# Patient Record
Sex: Male | Born: 1960 | Race: Black or African American | Hispanic: No | Marital: Single | Smoking: Current every day smoker
Health system: Southern US, Community
[De-identification: ages and names within clinical notes are randomized; demographics above are authoritative.]

## PROBLEM LIST (undated history)

## (undated) ENCOUNTER — Emergency Department (HOSPITAL_COMMUNITY): Discharge status: Absent without leave | Payer: Self-pay

## (undated) DIAGNOSIS — D649 Anemia, unspecified: Secondary | ICD-10-CM

## (undated) DIAGNOSIS — F32A Depression, unspecified: Secondary | ICD-10-CM

## (undated) DIAGNOSIS — E119 Type 2 diabetes mellitus without complications: Secondary | ICD-10-CM

## (undated) DIAGNOSIS — I639 Cerebral infarction, unspecified: Secondary | ICD-10-CM

## (undated) DIAGNOSIS — I700 Atherosclerosis of aorta: Secondary | ICD-10-CM

## (undated) DIAGNOSIS — J4541 Moderate persistent asthma with (acute) exacerbation: Secondary | ICD-10-CM

## (undated) DIAGNOSIS — M5430 Sciatica, unspecified side: Secondary | ICD-10-CM

## (undated) DIAGNOSIS — F1011 Alcohol abuse, in remission: Secondary | ICD-10-CM

## (undated) DIAGNOSIS — D179 Benign lipomatous neoplasm, unspecified: Secondary | ICD-10-CM

## (undated) DIAGNOSIS — Z9289 Personal history of other medical treatment: Secondary | ICD-10-CM

## (undated) DIAGNOSIS — I723 Aneurysm of iliac artery: Secondary | ICD-10-CM

## (undated) DIAGNOSIS — F419 Anxiety disorder, unspecified: Secondary | ICD-10-CM

## (undated) DIAGNOSIS — F319 Bipolar disorder, unspecified: Secondary | ICD-10-CM

## (undated) DIAGNOSIS — I2510 Atherosclerotic heart disease of native coronary artery without angina pectoris: Secondary | ICD-10-CM

## (undated) DIAGNOSIS — I10 Essential (primary) hypertension: Secondary | ICD-10-CM

## (undated) DIAGNOSIS — H3093 Unspecified chorioretinal inflammation, bilateral: Secondary | ICD-10-CM

## (undated) DIAGNOSIS — K219 Gastro-esophageal reflux disease without esophagitis: Secondary | ICD-10-CM

## (undated) DIAGNOSIS — M545 Low back pain, unspecified: Secondary | ICD-10-CM

## (undated) DIAGNOSIS — G5603 Carpal tunnel syndrome, bilateral upper limbs: Secondary | ICD-10-CM

## (undated) DIAGNOSIS — F209 Schizophrenia, unspecified: Secondary | ICD-10-CM

## (undated) DIAGNOSIS — Z7961 Long term (current) use of immunomodulator: Secondary | ICD-10-CM

## (undated) DIAGNOSIS — I499 Cardiac arrhythmia, unspecified: Secondary | ICD-10-CM

## (undated) DIAGNOSIS — I480 Paroxysmal atrial fibrillation: Secondary | ICD-10-CM

## (undated) DIAGNOSIS — I5189 Other ill-defined heart diseases: Secondary | ICD-10-CM

## (undated) DIAGNOSIS — F1290 Cannabis use, unspecified, uncomplicated: Secondary | ICD-10-CM

## (undated) DIAGNOSIS — F329 Major depressive disorder, single episode, unspecified: Secondary | ICD-10-CM

## (undated) DIAGNOSIS — G8929 Other chronic pain: Secondary | ICD-10-CM

## (undated) DIAGNOSIS — Z7901 Long term (current) use of anticoagulants: Secondary | ICD-10-CM

## (undated) DIAGNOSIS — L0591 Pilonidal cyst without abscess: Secondary | ICD-10-CM

## (undated) DIAGNOSIS — G43909 Migraine, unspecified, not intractable, without status migrainosus: Secondary | ICD-10-CM

## (undated) DIAGNOSIS — I739 Peripheral vascular disease, unspecified: Secondary | ICD-10-CM

## (undated) DIAGNOSIS — K760 Fatty (change of) liver, not elsewhere classified: Secondary | ICD-10-CM

## (undated) DIAGNOSIS — M5030 Other cervical disc degeneration, unspecified cervical region: Secondary | ICD-10-CM

## (undated) HISTORY — DX: Depression, unspecified: F32.A

## (undated) HISTORY — DX: Peripheral vascular disease, unspecified: I73.9

## (undated) HISTORY — PX: CATARACT EXTRACTION: SUR2

## (undated) HISTORY — DX: Paroxysmal atrial fibrillation: I48.0

## (undated) HISTORY — PX: GANGLION CYST EXCISION: SHX1691

## (undated) HISTORY — PX: CYST EXCISION: SHX5701

## (undated) HISTORY — DX: Sciatica, unspecified side: M54.30

## (undated) HISTORY — DX: Alcohol abuse, in remission: F10.11

## (undated) HISTORY — DX: Personal history of other medical treatment: Z92.89

## (undated) HISTORY — DX: Carpal tunnel syndrome, bilateral upper limbs: G56.03

## (undated) HISTORY — DX: Major depressive disorder, single episode, unspecified: F32.9

## (undated) HISTORY — DX: Moderate persistent asthma with (acute) exacerbation: J45.41

## (undated) SURGICAL SUPPLY — 4 items
NDL HYPO 25X1 1.5 SAFETY (NEEDLE) IMPLANT
NDL SPNL 18GX3.5 QUINCKE PK (NEEDLE) ×1 IMPLANT
SOLN 0.9% NACL 1000 ML (IV SOLUTION) ×2 IMPLANT
SOLN STERILE WATER 1000 ML (IV SOLUTION) ×2 IMPLANT

---

## 1998-01-03 ENCOUNTER — Emergency Department (HOSPITAL_COMMUNITY): Admission: EM | Admit: 1998-01-03 | Discharge: 1998-01-03 | Payer: Self-pay | Admitting: Family Medicine

## 1998-02-23 ENCOUNTER — Emergency Department (HOSPITAL_COMMUNITY): Admission: EM | Admit: 1998-02-23 | Discharge: 1998-02-23 | Payer: Self-pay | Admitting: Emergency Medicine

## 1998-05-24 ENCOUNTER — Encounter: Payer: Self-pay | Admitting: Emergency Medicine

## 1998-05-24 ENCOUNTER — Emergency Department (HOSPITAL_COMMUNITY): Admission: EM | Admit: 1998-05-24 | Discharge: 1998-05-24 | Payer: Self-pay | Admitting: Emergency Medicine

## 1998-06-21 ENCOUNTER — Emergency Department (HOSPITAL_COMMUNITY): Admission: EM | Admit: 1998-06-21 | Discharge: 1998-06-21 | Payer: Self-pay | Admitting: Emergency Medicine

## 1998-06-21 ENCOUNTER — Encounter: Payer: Self-pay | Admitting: Emergency Medicine

## 1999-01-10 ENCOUNTER — Emergency Department (HOSPITAL_COMMUNITY): Admission: EM | Admit: 1999-01-10 | Discharge: 1999-01-10 | Payer: Self-pay

## 1999-01-14 ENCOUNTER — Encounter (HOSPITAL_COMMUNITY): Admission: RE | Admit: 1999-01-14 | Discharge: 1999-04-14 | Payer: Self-pay | Admitting: Orthopedic Surgery

## 1999-01-18 ENCOUNTER — Encounter: Admission: RE | Admit: 1999-01-18 | Discharge: 1999-04-18 | Payer: Self-pay | Admitting: Orthopedic Surgery

## 2002-12-19 ENCOUNTER — Emergency Department (HOSPITAL_COMMUNITY): Admission: EM | Admit: 2002-12-19 | Discharge: 2002-12-19 | Payer: Self-pay | Admitting: Emergency Medicine

## 2003-07-19 HISTORY — PX: INTERCOSTAL NERVE BLOCK: SHX5021

## 2005-03-07 ENCOUNTER — Emergency Department (HOSPITAL_COMMUNITY): Admission: EM | Admit: 2005-03-07 | Discharge: 2005-03-07 | Payer: Self-pay | Admitting: Emergency Medicine

## 2006-03-12 ENCOUNTER — Emergency Department (HOSPITAL_COMMUNITY): Admission: EM | Admit: 2006-03-12 | Discharge: 2006-03-12 | Payer: Self-pay | Admitting: Emergency Medicine

## 2007-02-12 ENCOUNTER — Emergency Department (HOSPITAL_COMMUNITY): Admission: EM | Admit: 2007-02-12 | Discharge: 2007-02-12 | Payer: Self-pay | Admitting: Emergency Medicine

## 2007-07-27 ENCOUNTER — Emergency Department (HOSPITAL_COMMUNITY): Admission: EM | Admit: 2007-07-27 | Discharge: 2007-07-27 | Payer: Self-pay | Admitting: Emergency Medicine

## 2007-08-25 ENCOUNTER — Emergency Department (HOSPITAL_COMMUNITY): Admission: EM | Admit: 2007-08-25 | Discharge: 2007-08-25 | Payer: Self-pay | Admitting: Emergency Medicine

## 2007-10-17 ENCOUNTER — Ambulatory Visit (HOSPITAL_COMMUNITY): Admission: RE | Admit: 2007-10-17 | Discharge: 2007-10-17 | Payer: Self-pay | Admitting: Chiropractic Medicine

## 2009-02-10 ENCOUNTER — Emergency Department (HOSPITAL_COMMUNITY): Admission: EM | Admit: 2009-02-10 | Discharge: 2009-02-10 | Payer: Self-pay | Admitting: Emergency Medicine

## 2010-02-24 ENCOUNTER — Emergency Department (HOSPITAL_COMMUNITY): Admission: EM | Admit: 2010-02-24 | Discharge: 2010-02-24 | Payer: Self-pay | Admitting: Emergency Medicine

## 2010-03-01 ENCOUNTER — Emergency Department (HOSPITAL_COMMUNITY): Admission: EM | Admit: 2010-03-01 | Discharge: 2010-03-01 | Payer: Self-pay | Admitting: Emergency Medicine

## 2010-10-01 LAB — DIFFERENTIAL
Basophils Absolute: 0 10*3/uL (ref 0.0–0.1)
Basophils Absolute: 0 10*3/uL (ref 0.0–0.1)
Basophils Relative: 0 % (ref 0–1)
Basophils Relative: 0 % (ref 0–1)
Eosinophils Absolute: 0.1 10*3/uL (ref 0.0–0.7)
Eosinophils Absolute: 0.1 10*3/uL (ref 0.0–0.7)
Eosinophils Relative: 1 % (ref 0–5)
Eosinophils Relative: 2 % (ref 0–5)
Lymphocytes Relative: 18 % (ref 12–46)
Lymphocytes Relative: 30 % (ref 12–46)
Lymphs Abs: 1 10*3/uL (ref 0.7–4.0)
Lymphs Abs: 1.5 10*3/uL (ref 0.7–4.0)
Monocytes Absolute: 0.4 10*3/uL (ref 0.1–1.0)
Monocytes Absolute: 0.4 10*3/uL (ref 0.1–1.0)
Monocytes Relative: 7 % (ref 3–12)
Monocytes Relative: 8 % (ref 3–12)
Neutro Abs: 3 10*3/uL (ref 1.7–7.7)
Neutro Abs: 4.1 10*3/uL (ref 1.7–7.7)
Neutrophils Relative %: 60 % (ref 43–77)
Neutrophils Relative %: 74 % (ref 43–77)

## 2010-10-01 LAB — HEPATIC FUNCTION PANEL
ALT: 25 U/L (ref 0–53)
AST: 19 U/L (ref 0–37)
Albumin: 3.9 g/dL (ref 3.5–5.2)
Alkaline Phosphatase: 54 U/L (ref 39–117)
Bilirubin, Direct: 0.2 mg/dL (ref 0.0–0.3)
Indirect Bilirubin: 0.7 mg/dL (ref 0.3–0.9)
Total Bilirubin: 0.9 mg/dL (ref 0.3–1.2)
Total Protein: 8 g/dL (ref 6.0–8.3)

## 2010-10-01 LAB — URINALYSIS, ROUTINE W REFLEX MICROSCOPIC
Bilirubin Urine: NEGATIVE
Glucose, UA: NEGATIVE mg/dL
Glucose, UA: NEGATIVE mg/dL
Ketones, ur: NEGATIVE mg/dL
Leukocytes, UA: NEGATIVE
Leukocytes, UA: NEGATIVE
Nitrite: NEGATIVE
Nitrite: NEGATIVE
Protein, ur: NEGATIVE mg/dL
Protein, ur: NEGATIVE mg/dL
Specific Gravity, Urine: 1.019 (ref 1.005–1.030)
Specific Gravity, Urine: 1.026 (ref 1.005–1.030)
Urobilinogen, UA: 1 mg/dL (ref 0.0–1.0)
Urobilinogen, UA: 1 mg/dL (ref 0.0–1.0)
pH: 6 (ref 5.0–8.0)
pH: 6 (ref 5.0–8.0)

## 2010-10-01 LAB — URINE MICROSCOPIC-ADD ON

## 2010-10-01 LAB — COMPREHENSIVE METABOLIC PANEL
ALT: 47 U/L (ref 0–53)
AST: 36 U/L (ref 0–37)
Albumin: 3.7 g/dL (ref 3.5–5.2)
Alkaline Phosphatase: 54 U/L (ref 39–117)
BUN: 10 mg/dL (ref 6–23)
CO2: 26 mEq/L (ref 19–32)
Calcium: 9.3 mg/dL (ref 8.4–10.5)
Chloride: 105 mEq/L (ref 96–112)
Creatinine, Ser: 1.22 mg/dL (ref 0.4–1.5)
GFR calc non Af Amer: >60 mL/min (ref >60)
Glucose, Bld: 94 mg/dL (ref 70–99)
Potassium: 3.6 mEq/L (ref 3.5–5.1)
Sodium: 137 mEq/L (ref 135–145)
Total Bilirubin: 0.9 mg/dL (ref 0.3–1.2)
Total Protein: 7.4 g/dL (ref 6.0–8.3)

## 2010-10-01 LAB — BASIC METABOLIC PANEL
BUN: 9 mg/dL (ref 6–23)
CO2: 27 mEq/L (ref 19–32)
Calcium: 9.5 mg/dL (ref 8.4–10.5)
Chloride: 103 mEq/L (ref 96–112)
Creatinine, Ser: 1.27 mg/dL (ref 0.4–1.5)
GFR calc non Af Amer: >60 mL/min (ref >60)
Glucose, Bld: 119 mg/dL — ABNORMAL HIGH (ref 70–99)
Potassium: 3.6 mEq/L (ref 3.5–5.1)
Sodium: 137 mEq/L (ref 135–145)

## 2010-10-01 LAB — CBC
HCT: 41 % (ref 39.0–52.0)
HCT: 42 % (ref 39.0–52.0)
Hemoglobin: 13.4 g/dL (ref 13.0–17.0)
Hemoglobin: 13.6 g/dL (ref 13.0–17.0)
MCH: 25.4 pg — ABNORMAL LOW (ref 26.0–34.0)
MCH: 25.4 pg — ABNORMAL LOW (ref 26.0–34.0)
MCHC: 32.5 g/dL (ref 30.0–36.0)
MCHC: 32.6 g/dL (ref 30.0–36.0)
MCV: 78 fL (ref 78.0–100.0)
MCV: 78.3 fL (ref 78.0–100.0)
Platelets: 237 10*3/uL (ref 150–400)
Platelets: 243 10*3/uL (ref 150–400)
RBC: 5.26 MIL/uL (ref 4.22–5.81)
RBC: 5.36 MIL/uL (ref 4.22–5.81)
RDW: 15 % (ref 11.5–15.5)
RDW: 15.1 % (ref 11.5–15.5)
WBC: 5.1 10*3/uL (ref 4.0–10.5)
WBC: 5.6 10*3/uL (ref 4.0–10.5)

## 2010-10-01 LAB — LIPASE, BLOOD
Lipase: 24 U/L (ref 11–59)
Lipase: 28 U/L (ref 11–59)

## 2010-10-01 LAB — HEMOCCULT GUIAC POC 1CARD (OFFICE): Fecal Occult Bld: NEGATIVE

## 2011-05-02 LAB — CBC
HCT: 40.6
Hemoglobin: 13.3
MCHC: 32.8
MCV: 76.2 — ABNORMAL LOW
Platelets: 225
RBC: 5.32
RDW: 16.2 — ABNORMAL HIGH
WBC: 8.6

## 2011-05-02 LAB — BASIC METABOLIC PANEL
BUN: 8
CO2: 26
Calcium: 8.7
Chloride: 104
Creatinine, Ser: 1.16
GFR calc non Af Amer: >60
Glucose, Bld: 105 — ABNORMAL HIGH
Potassium: 3.6
Sodium: 136

## 2011-05-02 LAB — DIFFERENTIAL
Basophils Absolute: 0
Basophils Relative: 0
Eosinophils Absolute: 0.2
Eosinophils Relative: 2
Lymphocytes Relative: 18
Lymphs Abs: 1.5
Monocytes Absolute: 0.5
Monocytes Relative: 6
Neutro Abs: 6.4
Neutrophils Relative %: 74

## 2011-05-02 LAB — WOUND CULTURE: Culture: NO GROWTH

## 2011-06-01 ENCOUNTER — Emergency Department (HOSPITAL_COMMUNITY)
Admission: EM | Admit: 2011-06-01 | Discharge: 2011-06-02 | Disposition: A | Discharge status: Home / Self Care | Payer: No Typology Code available for payment source | Attending: Emergency Medicine | Admitting: Emergency Medicine

## 2011-06-01 ENCOUNTER — Encounter: Payer: Self-pay | Admitting: *Deleted

## 2011-06-01 DIAGNOSIS — M5126 Other intervertebral disc displacement, lumbar region: Secondary | ICD-10-CM | POA: Insufficient documentation

## 2011-06-01 DIAGNOSIS — R209 Unspecified disturbances of skin sensation: Secondary | ICD-10-CM | POA: Insufficient documentation

## 2011-06-01 DIAGNOSIS — R296 Repeated falls: Secondary | ICD-10-CM | POA: Insufficient documentation

## 2011-06-01 DIAGNOSIS — M545 Low back pain, unspecified: Secondary | ICD-10-CM | POA: Insufficient documentation

## 2011-06-01 DIAGNOSIS — M25569 Pain in unspecified knee: Secondary | ICD-10-CM | POA: Insufficient documentation

## 2011-06-01 DIAGNOSIS — M533 Sacrococcygeal disorders, not elsewhere classified: Secondary | ICD-10-CM | POA: Insufficient documentation

## 2011-06-01 DIAGNOSIS — F17200 Nicotine dependence, unspecified, uncomplicated: Secondary | ICD-10-CM | POA: Insufficient documentation

## 2011-06-01 DIAGNOSIS — G8929 Other chronic pain: Secondary | ICD-10-CM

## 2011-06-01 DIAGNOSIS — M546 Pain in thoracic spine: Secondary | ICD-10-CM | POA: Insufficient documentation

## 2011-06-01 DIAGNOSIS — W19XXXA Unspecified fall, initial encounter: Secondary | ICD-10-CM

## 2011-06-01 NOTE — ED Notes (Signed)
Pt states he was walking and right leg went into a hole. Pt c/o right ankle and knee pain. C/o of lower back pain. Pt has hx of chronic back pain.

## 2011-06-01 NOTE — ED Notes (Signed)
WRU:[id number]<UJ> Expected date:06/01/11<BR> Expected time:11:06 PM<BR> Means of arrival:Ambulance<BR> Comments:<BR> Fall, lsb, ankle injury

## 2011-06-02 ENCOUNTER — Emergency Department (HOSPITAL_COMMUNITY): Payer: No Typology Code available for payment source

## 2011-06-02 ENCOUNTER — Encounter (HOSPITAL_COMMUNITY): Payer: Self-pay

## 2011-06-02 MED ORDER — METHOCARBAMOL 500 MG PO TABS: 1000.0000 mg | ORAL_TABLET | Freq: Four times a day (QID) | ORAL | Status: AC | PRN

## 2011-06-02 MED ORDER — HYDROMORPHONE HCL PF 1 MG/ML IJ SOLN
0.5000 mg | Freq: Once | INTRAMUSCULAR | Status: AC
  Administered 2011-06-02: 0.5 mg via INTRAVENOUS
  Filled 2011-06-02: qty 1

## 2011-06-02 MED ORDER — OXYCODONE-ACETAMINOPHEN 5-325 MG PO TABS
2.0000 | ORAL_TABLET | Freq: Once | ORAL | Status: AC
  Administered 2011-06-02: 2 via ORAL
  Filled 2011-06-02: qty 2

## 2011-06-02 MED ORDER — OXYCODONE-ACETAMINOPHEN 5-325 MG PO TABS: ORAL_TABLET | ORAL | Status: AC

## 2011-06-02 MED ORDER — DIAZEPAM 5 MG PO TABS
5.0000 mg | ORAL_TABLET | Freq: Once | ORAL | Status: AC
  Administered 2011-06-02: 5 mg via ORAL
  Filled 2011-06-02: qty 1

## 2011-06-02 NOTE — ED Notes (Signed)
Pt back from MRI, alert and oriented, requesting food and pain meds. Pt made aware he is npo for now and will notify md of pain.

## 2011-06-02 NOTE — ED Provider Notes (Signed)
History     CSN: [id number] Arrival date & time: 06/01/2011 11:43 PM   First MD Initiated Contact with Patient 06/02/11 0122      Chief Complaint  Patient presents with  . Fall    (Consider location/radiation/quality/duration/timing/severity/associated sxs/prior treatment) HPI 50 year old Jason Stokes presents emergency department with complaint of fall into a hole with right leg pain and back pain. 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 Patient reports he was walking to the store when he stepped into a deep hole. Patient reports numbness to his right leg and pain in his right knee. He denies any head neck or upper body injury. Patient arrives via EMS with c-collar and backboard in place  Past Medical History  Diagnosis Date  . Chronic back pain     No past surgical history on file.  No family history on file.  History  Substance Use Topics  . Smoking status: Current Everyday Smoker -- 0.5 packs/day    Types: Cigarettes  . Smokeless tobacco: Not on file  . Alcohol Use: Yes     on occasion      Review of Systems  All other systems reviewed and are negative.    Allergies  Review of patient's allergies indicates no known allergies.  Home Medications   Current Outpatient Rx  Name Route Sig Dispense Refill  . IBUPROFEN 200 MG PO TABS Oral Take 200 mg by mouth every 6 (six) hours as needed. pain       BP 131/82  Pulse 94  Temp(Src) 97.9 F (36.6 C) (Oral)  Resp 20  SpO2 100%  Physical Exam  Nursing note and vitals reviewed. Constitutional: He is oriented to person, place, and time. He appears well-developed and well-nourished.       Mobilized on spine board and with c-collar in place. The patient is irritable  HENT:  Head: Normocephalic and atraumatic.  Nose: Nose normal.  Mouth/Throat: Oropharynx is clear and moist.  Eyes: Conjunctivae and EOM are normal. Pupils are equal, round, and reactive to light.  Neck: Normal range of motion.  Neck supple. No JVD present. No tracheal deviation present. No thyromegaly present.  Cardiovascular: Normal rate, regular rhythm, normal heart sounds and intact distal pulses.  Exam reveals no gallop and no friction rub.   No murmur heard. Pulmonary/Chest: Effort normal and breath sounds normal. No stridor. No respiratory distress. He has no wheezes. He has no rales. He exhibits no tenderness.  Abdominal: Soft. Bowel sounds are normal. He exhibits no distension and no mass. There is no tenderness. There is no rebound and no guarding.  Musculoskeletal: He exhibits no edema and no tenderness.       Pain with palpation along spine from mid thoracic to sacrum no step-offs or crepitus noted  Lymphadenopathy:    He has no cervical adenopathy.  Neurological: He is oriented to person, place, and time. No cranial nerve deficit. He exhibits normal muscle tone. Coordination normal.       Patient reports subjective loss of sensation from right mid thigh to right toes. Patient is able to flex and extend the great toe only on the right side. He is unable to lift the leg or move the ankle or the knee. No effusion noted to right knee no outward deformity or injury noted to the right leg  Skin: Skin is dry. No rash noted. No erythema. No pallor.  Psychiatric: He has a normal mood and affect. His behavior is normal. Judgment and  thought content normal.    ED Course  Procedures (including critical care time)  Labs Reviewed - No data to display Dg Thoracic Spine 2 View  06/02/2011  *RADIOLOGY REPORT*  Clinical Data: 50 year old male with back pain, stepped in hole.  THORACIC SPINE - 2 VIEW  Comparison: Lumbar series from the same day.  Findings: Normal thoracic segmentation.  Normal thoracic vertebral height and alignment.  Relatively preserved disc spaces. Visualized posterior ribs appear intact. Cervicothoracic junction alignment is within normal limits.  IMPRESSION: No acute fracture or listhesis identified in  the thoracic spine.  Original Report Authenticated By: Harley Hallmark, M.D.   Dg Lumbar Spine Complete  06/02/2011  *RADIOLOGY REPORT*  Clinical Data: 50 year old male with pain, stepped in hole, numbness radiating down the right lower extremity.  LUMBAR SPINE - COMPLETE 4+ VIEW  Comparison: 07/27/2007.  Findings: Normal lumbar segmentation.  Stable, normal vertebral height and alignment.  Relatively preserved disc spaces.  No pars fracture.  Sacrum and SI joints within normal limits.  IMPRESSION: Stable, negative radiographic appearance of the lumbar spine.  Original Report Authenticated By: Harley Hallmark, M.D.   Dg Knee Complete 4 Views Right  06/02/2011  *RADIOLOGY REPORT*  Clinical Data: Stepped in hole; right patellar knee pain.  RIGHT KNEE - COMPLETE 4+ VIEW  Comparison: None.  Findings: There is no evidence of fracture or dislocation.  The joint spaces are preserved.  No significant degenerative change is seen; the patellofemoral joint is grossly unremarkable in appearance.  A small joint effusion is seen.  The visualized soft tissues are otherwise unremarkable in appearance.  IMPRESSION:  1.  No evidence of fracture or dislocation. 2.  Small joint effusion noted.  Original Report Authenticated By: Tonia Ghent, M.D.        MDM  50 year old male with fall into a hole for subsequent aggravation of his chronic low back pain with numbness and decreased strength in right leg. X-rays without acute fracture or dislocation. Patient has been medicated with Dilaudid and Valium with out improvement in symptoms. He is awaiting an MRI of his lumbar spine. Care passed to Dr. Clarene Duke awaiting MRI results        Olivia Mackie, MD 06/02/11 [phone]

## 2011-06-02 NOTE — ED Notes (Signed)
Pt. Stated that he would like to speak with RN about pain medications.  Will notify RN.

## 2011-06-02 NOTE — ED Provider Notes (Addendum)
50yo M, c/o right lower back pain rad into RLE fall into a hole last night.  Pt has hx of same/chronic pain, endorses he has "bad disks" at "L4-L5" per his last MRI.  Echart review reveals pt has been seen multiple times in the ED since 2007 for c/o right sided LBP radiating into his RLE with decreased sensation RLE, and walking with limp.  Has seen a Production manager in [place], [place].  MRI LS today without acute surgical process:    Mr Lumbar Spine Wo Contrast 06/02/2011  *RADIOLOGY REPORT*  Clinical Data: Back pain with right leg pain  MRI LUMBAR SPINE WITHOUT CONTRAST  Technique:  Multiplanar and multiecho pulse sequences of the lumbar spine were obtained without intravenous contrast.  Comparison: Radiographs 06/02/2011  Findings: Normal lumbar alignment.  Negative for fracture or mass. No bone marrow edema.  Conus medullaris is normal and terminates at L1.  L1-2:  Negative  L2-3:  Negative  L3-4:  Mild disc bulging and early vertebral spurring.  No disc protrusion or significant stenosis.  L4-5:  Mild disc degeneration.  Mild disc bulging and early endplate spurring without disc protrusion or significant spinal stenosis.  L5-S1:  Negative  IMPRESSION: Mild disc degeneration L3-4 and L4-5.  No focal disc protrusion or neural impingement.  Original Report Authenticated By: Camelia Phenes, M.D.   Upon my entering the room, pt sitting up leaning over to his left side on his left hip with his entire right LE flexed/right foot on stretcher supporting him while he brushed his teeth.  Dx testing d/w pt.  Questions answered.  Verb understanding.  Pt then easily and without distress or apparent discomfort righted himself to sit upright with his hands behind his head, fully straightening is RLE.  Pt previously had been refusing to move his RLE for exam.  NAD, moves all ext well, resps easy , A&O.  Pt is agreeable to d/c home with outpt f/u but states "I want some breakfast and some pain medicine before I leave."    8:33 AM:  Called back into room by ED RN, pt apparently refusing to leave "unless I get some crutches or I'll just be right back here because I just know I'm going to fall again because of my back pain."  Pt encouraged to f/u with his Neurosurgeon in [place] for his chronic pain needs.  Verb understanding.            Refujio Haymer Allison Quarry, DO 06/02/11 1610  Laray Anger, DO 06/02/11 [phone]

## 2011-06-02 NOTE — ED Notes (Signed)
Pt off back board

## 2011-06-02 NOTE — ED Notes (Signed)
Patient transported to MRI 

## 2011-06-02 NOTE — ED Notes (Signed)
Pt provided discharge instructions and teaching pt provided crutches and teaching. Assisted to discharge window.

## 2011-07-18 ENCOUNTER — Encounter (HOSPITAL_COMMUNITY): Payer: Self-pay | Admitting: Emergency Medicine

## 2011-07-18 ENCOUNTER — Emergency Department (HOSPITAL_COMMUNITY)
Admission: EM | Admit: 2011-07-18 | Discharge: 2011-07-18 | Disposition: A | Discharge status: Home / Self Care | Payer: No Typology Code available for payment source | Attending: Emergency Medicine | Admitting: Emergency Medicine

## 2011-07-18 DIAGNOSIS — M549 Dorsalgia, unspecified: Secondary | ICD-10-CM

## 2011-07-18 DIAGNOSIS — M5380 Other specified dorsopathies, site unspecified: Secondary | ICD-10-CM | POA: Insufficient documentation

## 2011-07-18 DIAGNOSIS — M542 Cervicalgia: Secondary | ICD-10-CM | POA: Insufficient documentation

## 2011-07-18 DIAGNOSIS — M545 Low back pain, unspecified: Secondary | ICD-10-CM | POA: Insufficient documentation

## 2011-07-18 DIAGNOSIS — R209 Unspecified disturbances of skin sensation: Secondary | ICD-10-CM | POA: Insufficient documentation

## 2011-07-18 DIAGNOSIS — M79609 Pain in unspecified limb: Secondary | ICD-10-CM | POA: Insufficient documentation

## 2011-07-18 DIAGNOSIS — G8929 Other chronic pain: Secondary | ICD-10-CM

## 2011-07-18 DIAGNOSIS — F17200 Nicotine dependence, unspecified, uncomplicated: Secondary | ICD-10-CM | POA: Insufficient documentation

## 2011-07-18 DIAGNOSIS — R296 Repeated falls: Secondary | ICD-10-CM | POA: Insufficient documentation

## 2011-07-18 DIAGNOSIS — R1110 Vomiting, unspecified: Secondary | ICD-10-CM | POA: Insufficient documentation

## 2011-07-18 MED ORDER — DIAZEPAM 10 MG PO TABS: 5.0000 mg | ORAL_TABLET | Freq: Three times a day (TID) | ORAL | Status: AC | PRN

## 2011-07-18 MED ORDER — KETOROLAC TROMETHAMINE 60 MG/2ML IM SOLN
60.0000 mg | Freq: Once | INTRAMUSCULAR | Status: AC
  Administered 2011-07-18: 60 mg via INTRAMUSCULAR
  Filled 2011-07-18: qty 2

## 2011-07-18 MED ORDER — DIAZEPAM 5 MG PO TABS
10.0000 mg | ORAL_TABLET | Freq: Once | ORAL | Status: AC
  Administered 2011-07-18: 10 mg via ORAL
  Filled 2011-07-18: qty 2

## 2011-07-18 MED ORDER — OXYCODONE-ACETAMINOPHEN 5-325 MG PO TABS: 1.0000 | ORAL_TABLET | Freq: Four times a day (QID) | ORAL | Status: AC | PRN

## 2011-07-18 NOTE — ED Notes (Signed)
Back and neck spasms. Larey Seat in a hole where he lives on Nov. the 14th. Pain so bad now he can not take it any more.Marland Kitchen

## 2011-07-18 NOTE — ED Provider Notes (Signed)
History     CSN: [id number]  Arrival date & time 07/18/11  1612   First MD Initiated Contact with Patient 07/18/11 1838      Chief Complaint  Patient presents with  . Back Pain    low back and neck spasms x since the 14 of Nov. Has injuried at that time.     (Consider location/radiation/quality/duration/timing/severity/associated sxs/prior treatment) Patient is a 50 y.o. male presenting with back pain. The history is provided by the patient.  Back Pain  This is a chronic problem. The current episode started more than 1 week ago. The problem occurs constantly. The problem has not changed since onset.Associated with: Chronic back pain with worsening after he fell into a hole on November 14. The pain is present in the lumbar spine. The quality of the pain is described as stabbing. Radiates to: Entire right lower extremity. The pain is severe. Exacerbated by: Straightening his leg. The pain is worse during the night. Associated symptoms include numbness, leg pain and tingling. Pertinent negatives include no chest pain, no fever, no weight loss, no headaches, no abdominal pain, no abdominal swelling, no bowel incontinence, no perianal numbness, no bladder incontinence, no dysuria, no pelvic pain and no weakness. Treatments tried: Narcotics, the patient reports he has run out of his prior prescription. The treatment provided significant relief.    Past Medical History  Diagnosis Date  . Chronic back pain     History reviewed. No pertinent past surgical history.  No family history on file.  History  Substance Use Topics  . Smoking status: Current Some Day Smoker -- 0.5 packs/day    Types: Cigarettes  . Smokeless tobacco: Never Used  . Alcohol Use: Yes     on occasion      Review of Systems  Constitutional: Negative for fever, chills and weight loss.  HENT: Positive for neck pain. Negative for sore throat and neck stiffness.   Eyes: Negative for pain and visual disturbance.    Respiratory: Negative for cough, chest tightness and shortness of breath.   Cardiovascular: Negative for chest pain.  Gastrointestinal: Negative for nausea, abdominal pain, constipation and bowel incontinence.       Vomiting yesterday but none today  Genitourinary: Negative for bladder incontinence, dysuria, hematuria and pelvic pain.  Musculoskeletal: Positive for back pain.       He reports difficulty ambulating  Skin: Negative for rash and wound.  Neurological: Positive for tingling and numbness. Negative for syncope, weakness and headaches.  Psychiatric/Behavioral: Negative for confusion.    Allergies  Review of patient's allergies indicates no known allergies.  Home Medications   Current Outpatient Rx  Name Route Sig Dispense Refill  . IBUPROFEN 200 MG PO TABS Oral Take 200 mg by mouth every 6 (six) hours as needed. pain       BP 162/105  Pulse 83  Temp(Src) 98.5 F (36.9 C) (Oral)  Resp 22  Physical Exam  Nursing note and vitals reviewed. Constitutional: He is oriented to person, place, and time. He appears well-developed and well-nourished. No distress.  HENT:  Head: Normocephalic and atraumatic.  Right Ear: External ear normal.  Left Ear: External ear normal.  Nose: Nose normal.  Mouth/Throat: Oropharynx is clear and moist.  Eyes: Conjunctivae are normal. Pupils are equal, round, and reactive to light.  Neck: Normal range of motion. Neck supple.       Tenderness to palpation of the right paracervical region with no palpated spasm  Cardiovascular: Normal rate, regular  rhythm and intact distal pulses.   Pulmonary/Chest: Effort normal. No respiratory distress. He exhibits no tenderness.  Abdominal: Soft. Bowel sounds are normal. He exhibits no distension. There is no tenderness.  Musculoskeletal: Normal range of motion. He exhibits no edema.       No midline spinal tenderness to palpation. There is tenderness to palpation of the right paralumbar region with  associated spasm.  Lymphadenopathy:    He has no cervical adenopathy.  Neurological: He is alert and oriented to person, place, and time. He has normal reflexes. No cranial nerve deficit. Coordination normal.       Patient with a limping gait at time of examination. Of note, he was witnessed by myself ambulating without any difficulty to speak with his fiance, a distance of approximately 20 feet was observed. He reports decreased sensation to light touch over the entire right lower extremity with no specific distribution he does have intact sensation to painful stimuli.  Skin: Skin is warm and dry. No rash noted. No erythema.    ED Course  Procedures (including critical care time)  Labs Reviewed - No data to display No results found.   Dx 1: Back pain, chronic   MDM  Chronic back pain with worsening since November 14 after he fell in a hole. He was evaluated in the emergency department at that time and an MRI of the lumbar spine was performed with no acute findings. I reviewed his prior visit note and it appears that although he initially reported that he could not move the right lower extremity, he was able to move without difficulty upon reassessment after an MRI was performed. On my exam, he is reluctant to ambulate and has a limping gait but observation of his gait when going to speak with the family member demonstrates no difficulty. No fever, cancer history, HIV, IV drug use. No fever, saddle anesthesia. He does have spasm of the right paralumbar region on exam. I treated him with Toradol and Valium and we'll plan to send him home after treatment. Stress with patient that he should followup with his neurosurgeon in [facility]  I have reviewed his records in the [place] controlled substance registry and there is only one controlled substance prescription in the last 6 months. I will write him a prescription for pain medication.       Elwyn Reach [address],  [place] 07/18/11 2025

## 2011-07-19 NOTE — ED Provider Notes (Signed)
Medical screening examination/treatment/procedure(s) were performed by non-physician practitioner and as supervising physician I was immediately available for consultation/collaboration.   Shaakira Borrero A. Patrica Duel, MD 07/19/11 1459

## 2011-09-07 ENCOUNTER — Emergency Department (HOSPITAL_COMMUNITY)
Admission: EM | Admit: 2011-09-07 | Discharge: 2011-09-07 | Disposition: A | Discharge status: Home / Self Care | Payer: No Typology Code available for payment source | Attending: Emergency Medicine | Admitting: Emergency Medicine

## 2011-09-07 ENCOUNTER — Encounter (HOSPITAL_COMMUNITY): Payer: Self-pay | Admitting: *Deleted

## 2011-09-07 DIAGNOSIS — G8929 Other chronic pain: Secondary | ICD-10-CM

## 2011-09-07 DIAGNOSIS — W19XXXA Unspecified fall, initial encounter: Secondary | ICD-10-CM | POA: Insufficient documentation

## 2011-09-07 DIAGNOSIS — I10 Essential (primary) hypertension: Secondary | ICD-10-CM

## 2011-09-07 DIAGNOSIS — M549 Dorsalgia, unspecified: Secondary | ICD-10-CM | POA: Insufficient documentation

## 2011-09-07 MED ORDER — OXYCODONE-ACETAMINOPHEN 5-325 MG PO TABS: 1.0000 | ORAL_TABLET | ORAL | Status: AC | PRN

## 2011-09-07 MED ORDER — DIAZEPAM 5 MG PO TABS: 5.0000 mg | ORAL_TABLET | Freq: Two times a day (BID) | ORAL | Status: AC

## 2011-09-07 MED ORDER — HYDROMORPHONE HCL PF 2 MG/ML IJ SOLN
2.0000 mg | Freq: Once | INTRAMUSCULAR | Status: AC
  Administered 2011-09-07: 2 mg via INTRAMUSCULAR
  Filled 2011-09-07: qty 1

## 2011-09-07 NOTE — ED Notes (Signed)
Pt came out of his room, states "im ready to go."  Pt is ambulatory with a limp and with steady gait.

## 2011-09-07 NOTE — ED Provider Notes (Signed)
seen by mepatient's back pain and balance issues are chronic and unchanged since November of 2012 . Reports multiple falls  No new injury  Has not follow up with neurosurgery as suggested  Patient has crutches at home   Doug Sou, MD 09/07/11 1636

## 2011-09-07 NOTE — ED Notes (Signed)
MD at bedside. (Dr. Jacubowitz) 

## 2011-09-07 NOTE — ED Provider Notes (Signed)
Medical screening examination/treatment/procedure(s) were conducted as a shared visit with non-physician practitioner(s) and myself.  I personally evaluated the patient during the encounter  Doug Sou, MD 09/07/11 [phone]

## 2011-09-07 NOTE — ED Notes (Signed)
Pt reports fall last night, states his legs gave out on him.  Pt has hx of chronic back pain.  Has had an MRI done in November and was seen here in December and was given a referral to a neurologist but did not follow up.  Pt also ran out of his pain meds last night.  Pt denies LOC.

## 2011-09-07 NOTE — ED Notes (Signed)
JXB:[id number]<GN> Expected date:09/07/11<BR> Expected time:12:23 PM<BR> Means of arrival:Ambulance<BR> Comments:<BR> Back pain

## 2011-09-07 NOTE — ED Notes (Signed)
Per EMS, pt from home, reports fall last night.  Reports back pain.  Pt has hx of chronic back pain, and ran out of oxycontin last night.

## 2011-09-07 NOTE — Discharge Instructions (Signed)
Continue pain management. Stretch your back gently daily. Follow up with a specialist or primary care doctor for further management.  You blood pressure is hight today too. You need to see your doctor for further management of your blood pressure  Back Pain, Adult Low back pain is very common. About 1 in 5 people have back pain.The cause of low back pain is rarely dangerous. The pain often gets better over time.About half of people with a sudden onset of back pain feel better in just 2 weeks. About 8 in 10 people feel better by 6 weeks.  CAUSES Some common causes of back pain include:  Strain of the muscles or ligaments supporting the spine.   Wear and tear (degeneration) of the spinal discs.   Arthritis.   Direct injury to the back.  DIAGNOSIS Most of the time, the direct cause of low back pain is not known.However, back pain can be treated effectively even when the exact cause of the pain is unknown.Answering your caregiver's questions about your overall health and symptoms is one of the most accurate ways to make sure the cause of your pain is not dangerous. If your caregiver needs more information, he or she may order lab work or imaging tests (X-rays or MRIs).However, even if imaging tests show changes in your back, this usually does not require surgery. HOME CARE INSTRUCTIONS For many people, back pain returns.Since low back pain is rarely dangerous, it is often a condition that people can learn to [facility] their own.   Remain active. It is stressful on the back to sit or stand in one place. Do not sit, drive, or stand in one place for more than 30 minutes at a time. Take short walks on level surfaces as soon as pain allows.Try to increase the length of time you walk each day.   Do not stay in bed.Resting more than 1 or 2 days can delay your recovery.   Do not avoid exercise or work.Your body is made to move.It is not dangerous to be active, even though your back may  hurt.Your back will likely heal faster if you return to being active before your pain is gone.   Pay attention to your body when you bend and lift. Many people have less discomfortwhen lifting if they bend their knees, keep the load close to their bodies,and avoid twisting. Often, the most comfortable positions are those that put less stress on your recovering back.   Find a comfortable position to sleep. Use a firm mattress and lie on your side with your knees slightly bent. If you lie on your back, put a pillow under your knees.   Only take over-the-counter or prescription medicines as directed by your caregiver. Over-the-counter medicines to reduce pain and inflammation are often the most helpful.Your caregiver may prescribe muscle relaxant drugs.These medicines help dull your pain so you can more quickly return to your normal activities and healthy exercise.   Put ice on the injured area.   Put ice in a plastic bag.   Place a towel between your skin and the bag.   Leave the ice on for 15 to 20 minutes, 3 to 4 times a day for the first 2 to 3 days. After that, ice and heat may be alternated to reduce pain and spasms.   Ask your caregiver about trying back exercises and gentle massage. This may be of some benefit.   Avoid feeling anxious or stressed.Stress increases muscle tension and can worsen back  pain.It is important to recognize when you are anxious or stressed and learn ways to manage it.Exercise is a great option.  SEEK MEDICAL CARE IF:  You have pain that is not relieved with rest or medicine.   You have pain that does not improve in 1 week.   You have new symptoms.   You are generally not feeling well.  SEEK IMMEDIATE MEDICAL CARE IF:   You have pain that radiates from your back into your legs.   You develop new bowel or bladder control problems.   You have unusual weakness or numbness in your arms or legs.   You develop nausea or vomiting.   You develop  abdominal pain.   You feel faint.  Document Released: 07/04/2005 Document Revised: 03/16/2011 Document Reviewed: 11/22/2010 [facility] Patient Information 2012 [place], [place].

## 2011-09-07 NOTE — ED Provider Notes (Signed)
History     CSN: [id number]  Arrival date & time 09/07/11  1244   First MD Initiated Contact with Patient 09/07/11 1331      Chief Complaint  Patient presents with  . Fall  . Back Pain    (Consider location/radiation/quality/duration/timing/severity/associated sxs/prior treatment) Patient is a 51 y.o. male presenting with back pain. The history is provided by the patient.  Back Pain  This is a chronic problem. The current episode started more than 1 week ago. The problem occurs constantly. The problem has been gradually worsening. The pain is associated with falling. The pain is present in the lumbar spine. The quality of the pain is described as shooting. The pain radiates to the right thigh, right knee and right foot. The pain is at a severity of 9/10. The pain is moderate. The symptoms are aggravated by bending and twisting. Associated symptoms include numbness, paresthesias, tingling and weakness. Pertinent negatives include no fever, no abdominal pain, no abdominal swelling, no perianal numbness, no bladder incontinence, no dysuria and no leg pain.  Pt states he has had pain in lower back radiating down right leg since his fall in November last year. States his pain gradually worsening. Today got up to walk and his legs slipped from underneath him and he states he fell down. States this exacerbated his back pain. Pt also mentions he ran out of pain medications last night. Pt states has not followed up due to financial problems. Reports numbness and weakness in right foot. Denies urinary or fecal incontinence or retention. NO perineum numbness  Past Medical History  Diagnosis Date  . Chronic back pain     History reviewed. No pertinent past surgical history.  No family history on file.  History  Substance Use Topics  . Smoking status: Current Some Day Smoker -- 0.5 packs/day    Types: Cigarettes  . Smokeless tobacco: Never Used  . Alcohol Use: Yes     on occasion       Review of Systems  Constitutional: Negative for fever.  HENT: Negative.   Eyes: Negative.   Respiratory: Negative.   Cardiovascular: Negative.   Gastrointestinal: Negative.  Negative for abdominal pain.  Genitourinary: Negative.  Negative for bladder incontinence and dysuria.  Musculoskeletal: Positive for back pain.  Skin: Negative.   Neurological: Positive for tingling, weakness, numbness and paresthesias.  Psychiatric/Behavioral: Negative.     Allergies  Review of patient's allergies indicates no known allergies.  Home Medications   Current Outpatient Rx  Name Route Sig Dispense Refill  . DIAZEPAM 10 MG PO TABS Oral Take 5 mg by mouth every 6 (six) hours as needed. spasms    . IBUPROFEN 200 MG PO TABS Oral Take 200 mg by mouth every 6 (six) hours as needed. pain     . OXYCODONE-ACETAMINOPHEN 5-325 MG PO TABS Oral Take 1-2 tablets by mouth every 6 (six) hours as needed. For pain    . DIAZEPAM 5 MG PO TABS Oral Take 1 tablet (5 mg total) by mouth 2 (two) times daily. 10 tablet 0  . OXYCODONE-ACETAMINOPHEN 5-325 MG PO TABS Oral Take 1 tablet by mouth every 4 (four) hours as needed for pain. 20 tablet 0    BP 157/101  Pulse 82  Temp(Src) 98.6 F (37 C) (Oral)  Resp 18  Ht 5' 11.5" (1.816 m)  Wt 245 lb (111.131 kg)  BMI 33.69 kg/m2  SpO2 100%  Physical Exam  Nursing note and vitals reviewed. Constitutional: He is oriented  to person, place, and time. He appears well-developed and well-nourished.  HENT:  Head: Normocephalic.  Eyes: Conjunctivae are normal.  Neck: Neck supple.  Cardiovascular: Normal rate, regular rhythm and normal heart sounds.   Pulmonary/Chest: Effort normal and breath sounds normal. No respiratory distress.  Abdominal: Soft. Bowel sounds are normal. He exhibits no distension.  Musculoskeletal: Normal range of motion.       Pt unable to dorsiflex right foot on his own. However if you dorsiflex it for him, he is able to hold it there. Whenever  he does it, his right leg starts to shake vigorously. Normal pedal pulses. Decreased sensation of right foot and shin compared to left. Full ROm at right hip, however, pain with straight leg raise. Pr has few trigger points in lower back, tender to palpation  Neurological: He is alert and oriented to person, place, and time. He has normal reflexes. No cranial nerve deficit. He exhibits normal muscle tone.  Skin: Skin is warm and dry.  Psychiatric: He has a normal mood and affect.    ED Course  Procedures (including critical care time)  Pt with chronic back pain. No changes in neurological exam, he states he wants more answers. MRI in nov 2012, no significant findings. Discussed case with Dr. Ethelda Chick, who saw pt, will refer to specialist since multiple trips here in ED. No signs of cauda equina. No fever. Denies drug use. Pt ambulatory.  1. Back pain, chronic   2. Hypertension       MDM          Lottie Mussel, PA 09/07/11 1617

## 2011-09-07 NOTE — ED Notes (Signed)
Pt called for transport.  States his ride will be here at 1600.

## 2012-10-12 ENCOUNTER — Emergency Department (HOSPITAL_COMMUNITY)
Admission: EM | Admit: 2012-10-12 | Discharge: 2012-10-12 | Disposition: A | Discharge status: Other Acute Inpatient Hospital | Payer: Medicaid Other | Source: Home / Self Care | Attending: Family Medicine | Admitting: Family Medicine

## 2012-10-12 ENCOUNTER — Encounter (HOSPITAL_COMMUNITY): Payer: Self-pay | Admitting: *Deleted

## 2012-10-12 DIAGNOSIS — M545 Other chronic pain: Secondary | ICD-10-CM | POA: Insufficient documentation

## 2012-10-12 DIAGNOSIS — E1159 Type 2 diabetes mellitus with other circulatory complications: Secondary | ICD-10-CM | POA: Insufficient documentation

## 2012-10-12 DIAGNOSIS — M5416 Radiculopathy, lumbar region: Secondary | ICD-10-CM | POA: Insufficient documentation

## 2012-10-12 DIAGNOSIS — G4700 Insomnia, unspecified: Secondary | ICD-10-CM

## 2012-10-12 DIAGNOSIS — J309 Allergic rhinitis, unspecified: Secondary | ICD-10-CM | POA: Insufficient documentation

## 2012-10-12 DIAGNOSIS — F329 Major depressive disorder, single episode, unspecified: Secondary | ICD-10-CM

## 2012-10-12 DIAGNOSIS — M5430 Sciatica, unspecified side: Secondary | ICD-10-CM

## 2012-10-12 DIAGNOSIS — G8929 Other chronic pain: Secondary | ICD-10-CM | POA: Insufficient documentation

## 2012-10-12 DIAGNOSIS — M5431 Sciatica, right side: Secondary | ICD-10-CM

## 2012-10-12 DIAGNOSIS — I10 Essential (primary) hypertension: Secondary | ICD-10-CM | POA: Insufficient documentation

## 2012-10-12 HISTORY — DX: Essential (primary) hypertension: I10

## 2012-10-12 MED ORDER — OXYCODONE-ACETAMINOPHEN 5-325 MG PO TABS: 1.0000 | ORAL_TABLET | Freq: Three times a day (TID) | ORAL | Status: DC | PRN

## 2012-10-12 MED ORDER — AMOXICILLIN 500 MG PO TABS: 500.0000 mg | ORAL_TABLET | Freq: Two times a day (BID) | ORAL | Status: DC

## 2012-10-12 MED ORDER — CETIRIZINE HCL 10 MG PO TABS: 10.0000 mg | ORAL_TABLET | Freq: Every day | ORAL | Status: DC

## 2012-10-12 NOTE — ED Notes (Signed)
Pt states his sinuses are bothering him and problems with shoulders.  Fell in 2012 and now has back problems.

## 2012-10-12 NOTE — ED Provider Notes (Signed)
History     CSN: [id number]  Arrival date & time 10/12/12  1143   First MD Initiated Contact with Patient 10/12/12 1315      Chief Complaint  Patient presents with  . Establish Care  HPI Pt reports that he has been patient has chronic back pain for a long time and reports that he has not had medical insurance and reports that he now has medicaid and reports that he is ready to see a back doctor.  He has sinus pain and pressure for past 2 weeks and now with more thick yellow foul tasting drainage.  No fever or chills reported.  Pt has treated hypertension and insomnia.    Past Medical History  Diagnosis Date  . Chronic back pain   . Hypertension     History reviewed. No pertinent past surgical history.  No family history on file.  History  Substance Use Topics  . Smoking status: Current Some Day Smoker -- 0.50 packs/day    Types: Cigarettes  . Smokeless tobacco: Never Used  . Alcohol Use: Yes     Comment: on occasion    Review of Systems  HENT: Positive for congestion, rhinorrhea and postnasal drip.   Musculoskeletal: Positive for back pain.  All other systems reviewed and are negative.    Allergies  Lisinopril  Home Medications   Current Outpatient Rx  Name  Route  Sig  Dispense  Refill  . atenolol (TENORMIN) 50 MG tablet   Oral   Take 50 mg by mouth daily.         Marland Kitchen FLUoxetine (PROZAC) 20 MG capsule   Oral   Take 20 mg by mouth daily.         . hydrochlorothiazide (HYDRODIURIL) 25 MG tablet   Oral   Take 25 mg by mouth daily.         Marland Kitchen HYDROcodone-acetaminophen (NORCO/VICODIN) 5-325 MG per tablet   Oral   Take 1 tablet by mouth every 6 (six) hours as needed for pain.         . traZODone (DESYREL) 50 MG tablet   Oral   Take 50 mg by mouth at bedtime.         . diazepam (VALIUM) 10 MG tablet   Oral   Take 5 mg by mouth every 6 (six) hours as needed. spasms         . ibuprofen (ADVIL,MOTRIN) 200 MG tablet   Oral   Take 200 mg by mouth  every 6 (six) hours as needed. pain          . oxyCODONE-acetaminophen (PERCOCET) 5-325 MG per tablet   Oral   Take 1-2 tablets by mouth every 6 (six) hours as needed. For pain           BP 111/68  Pulse 59  Temp(Src) 98.5 F (36.9 C) (Oral)  SpO2 100%  Physical Exam  Nursing note and vitals reviewed. Constitutional: He is oriented to person, place, and time. He appears well-developed and well-nourished. No distress.  HENT:  Head: Normocephalic and atraumatic.  Eyes: Conjunctivae and EOM are normal. Pupils are equal, round, and reactive to light.  Neck: Normal range of motion. Neck supple. No JVD present. No tracheal deviation present. No thyromegaly present.  Cardiovascular: Normal rate, regular rhythm and normal heart sounds.   Pulmonary/Chest: Effort normal and breath sounds normal. No respiratory distress. He has no wheezes.  Abdominal: Soft. Bowel sounds are normal.  Musculoskeletal: Normal range of motion. He  exhibits tenderness. He exhibits no edema.       Lumbar back: He exhibits tenderness, pain and spasm. He exhibits no swelling, no edema, no deformity and no laceration.  Lymphadenopathy:    He has no cervical adenopathy.  Neurological: He is alert and oriented to person, place, and time. He exhibits normal muscle tone. Coordination normal.  Skin: Skin is warm and dry.  Psychiatric: He has a normal mood and affect. His behavior is normal. Judgment and thought content normal.    ED Course  Procedures (including critical care time)  Labs Reviewed - No data to display No results found.   No diagnosis found.    MDM  IMPRESSION  Low back pain chronic  Hypertension  Right shoulder pain  Sinusitis  Allergic rhinitis  RECOMMENDATIONS / PLAN Referral to orthopedics for evaluation Refilled percocet to use only as needed for severe pain, but I explained to patient that we did not do chronic pain management in this clinic - the patient verbalized  understanding. Consider referral to chronic pain management Obtain old records. Amoxicillin bid  Cetirizine 10 mg po daily  FOLLOW UP 1 month for labs and recheck HTN  The patient was given clear instructions to go to ER or return to medical center if symptoms don't improve, worsen or new problems develop.  The patient verbalized understanding.  The patient was told to call to get lab results if they haven't heard anything in the next week.            Cleora Fleet, MD 10/13/12 [phone]

## 2012-10-18 NOTE — ED Notes (Signed)
Referral faxed to [facility]- has an appt 11/06/10 @ 1:30 pm-low back pain and right shoulder pain

## 2012-11-07 ENCOUNTER — Ambulatory Visit (INDEPENDENT_AMBULATORY_CARE_PROVIDER_SITE_OTHER): Payer: Medicaid Other | Admitting: Family Medicine

## 2012-11-07 VITALS — BP 149/95 | Ht 71.0 in | Wt 248.0 lb

## 2012-11-07 DIAGNOSIS — G8929 Other chronic pain: Secondary | ICD-10-CM

## 2012-11-07 DIAGNOSIS — M545 Low back pain: Secondary | ICD-10-CM

## 2012-11-07 NOTE — Patient Instructions (Addendum)
Thank you for coming in today. We need to get some help.  I am worried about damage to your spinal cord.  I would like you to see a special type of doctor called a Physical Medicine and Rehab doctor.  We will have to get your primary doctor to do the referral to get you in there.

## 2012-11-07 NOTE — Assessment & Plan Note (Signed)
Patient has chronic low back pain with right leg radicular pain.  His MRI is essentially normal His physical exam is significant for right leg weakness, subjective numbness and most surprisingly for clonus involving the knee.  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  His MRI is normal which is unusual in this situation.   I am not sure if I nerve conduction study would be helpful at this point. I feel that he'll following up with a physical medicine and rehabilitation Dr. would be beneficial as they have more experience is up from her neuron disease and would be more knowledgeable of the right test to order in this situation.   I cannot order the referral due to his insurance. I will ask his primary care provider to refer to the [facility] for a PMNR evaluation and potential nerve conduction study.    Patient expresses understanding and agreement  Pain management per primary care
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
## 2012-11-13 NOTE — ED Notes (Signed)
Referral faxed to [facility] for evaluation and possible nerve conduction studay Waiting for an appointment

## 2012-11-23 ENCOUNTER — Encounter: Payer: Self-pay | Admitting: Physical Medicine & Rehabilitation

## 2012-11-30 ENCOUNTER — Ambulatory Visit: Payer: Medicaid Other | Attending: Family Medicine | Admitting: Family Medicine

## 2012-11-30 VITALS — BP 127/78 | HR 53 | Temp 97.9°F | Resp 16 | Wt 242.0 lb

## 2012-11-30 DIAGNOSIS — M545 Low back pain: Secondary | ICD-10-CM

## 2012-11-30 DIAGNOSIS — F329 Major depressive disorder, single episode, unspecified: Secondary | ICD-10-CM

## 2012-11-30 DIAGNOSIS — M5430 Sciatica, unspecified side: Secondary | ICD-10-CM

## 2012-11-30 DIAGNOSIS — I10 Essential (primary) hypertension: Secondary | ICD-10-CM

## 2012-11-30 DIAGNOSIS — J309 Allergic rhinitis, unspecified: Secondary | ICD-10-CM

## 2012-11-30 DIAGNOSIS — M5431 Sciatica, right side: Secondary | ICD-10-CM

## 2012-11-30 DIAGNOSIS — G8929 Other chronic pain: Secondary | ICD-10-CM

## 2012-11-30 DIAGNOSIS — G4700 Insomnia, unspecified: Secondary | ICD-10-CM

## 2012-11-30 MED ORDER — GABAPENTIN 300 MG PO CAPS: 300.0000 mg | ORAL_CAPSULE | Freq: Every day | ORAL | Status: DC

## 2012-11-30 MED ORDER — OXYCODONE-ACETAMINOPHEN 7.5-325 MG PO TABS: 1.0000 | ORAL_TABLET | Freq: Four times a day (QID) | ORAL | Status: DC | PRN

## 2012-11-30 NOTE — Patient Instructions (Addendum)
Patient has chronic low back pain with right leg radicular pain.  His MRI is essentially normal  His physical exam is significant for right leg weakness, subjective numbness and most surprisingly for clonus involving the knee.  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  His MRI is normal which is unusual in this situation.  I am not sure if I nerve conduction study would be helpful at this point. I feel that he'll following up with a physical medicine and rehabilitation Dr. would be beneficial as they have more experience is up from her neuron disease and would be more knowledgeable of the right test to order in this situation.  I cannot order the referral due to his insurance. I will ask his primary care provider to refer to the [facility] for a PMNR evaluation and potential nerve conduction study.  Patient expresses understanding and agreement  Pain management per primary care    Hypertension As your heart beats, it forces blood through your arteries. This force is your blood pressure. If the pressure is too high, it is called hypertension (HTN) or high blood pressure. HTN is dangerous because you may have it and not know it. High blood pressure may mean that your heart has to work harder to pump blood. Your arteries may be narrow or stiff. The extra work puts you at risk for heart disease, stroke, and other problems.  Blood pressure consists of two numbers, a higher number over a lower, 110/72, for example. It is stated as "110 over 72." The ideal is below 120 for the top number (systolic) and under 80 for the bottom (diastolic). Write down your blood pressure today. You should pay close attention to your blood pressure if you have certain conditions such as:  Heart failure.  Prior heart attack.  Diabetes  Chronic kidney disease.  Prior stroke.  Multiple risk factors for heart disease. To see if you have HTN, your blood pressure should be  measured while you are seated with your arm held at the level of the heart. It should be measured at least twice. A one-time elevated blood pressure reading (especially in the Emergency Department) does not mean that you need treatment. There may be conditions in which the blood pressure is different between your right and left arms. It is important to see your caregiver soon for a recheck. Most people have essential hypertension which means that there is not a specific cause. This type of high blood pressure may be lowered by changing lifestyle factors such as:  Stress.  Smoking.  Lack of exercise.  Excessive weight.  Drug/tobacco/alcohol use.  Eating less salt. Most people do not have symptoms from high blood pressure until it has caused damage to the body. Effective treatment can often prevent, delay or reduce that damage. TREATMENT  When a cause has been identified, treatment for high blood pressure is directed at the cause. There are a large number of medications to treat HTN. These fall into several categories, and your caregiver will help you select the medicines that are best for you. Medications may have side effects. You should review side effects with your caregiver. If your blood pressure stays high after you have made lifestyle changes or started on medicines,   Your medication(s) may need to be changed.  Other problems may need to be addressed.  Be certain you understand your prescriptions, and know how and when to take your medicine.  Be sure  to follow up with your caregiver within the time frame advised (usually within two weeks) to have your blood pressure rechecked and to review your medications.  If you are taking more than one medicine to lower your blood pressure, make sure you know how and at what times they should be taken. Taking two medicines at the same time can result in blood pressure that is too low. SEEK IMMEDIATE MEDICAL CARE IF:  You develop a severe  headache, blurred or changing vision, or confusion.  You have unusual weakness or numbness, or a faint feeling.  You have severe chest or abdominal pain, vomiting, or breathing problems. MAKE SURE YOU:   Understand these instructions.  Will watch your condition.  Will get help right away if you are not doing well or get worse. Document Released: 07/04/2005 Document Revised: 09/26/2011 Document Reviewed: 02/22/2008 [facility] Patient Information 2013 [place], [place]. Back Pain, Adult Low back pain is very common. About 1 in 5 people have back pain.The cause of low back pain is rarely dangerous. The pain often gets better over time.About half of people with a sudden onset of back pain feel better in just 2 weeks. About 8 in 10 people feel better by 6 weeks.  CAUSES Some common causes of back pain include:  Strain of the muscles or ligaments supporting the spine.  Wear and tear (degeneration) of the spinal discs.  Arthritis.  Direct injury to the back. DIAGNOSIS Most of the time, the direct cause of low back pain is not known.However, back pain can be treated effectively even when the exact cause of the pain is unknown.Answering your caregiver's questions about your overall health and symptoms is one of the most accurate ways to make sure the cause of your pain is not dangerous. If your caregiver needs more information, he or she may order lab work or imaging tests (X-rays or MRIs).However, even if imaging tests show changes in your back, this usually does not require surgery. HOME CARE INSTRUCTIONS For many people, back pain returns.Since low back pain is rarely dangerous, it is often a condition that people can learn to [facility] their own.   Remain active. It is stressful on the back to sit or stand in one place. Do not sit, drive, or stand in one place for more than 30 minutes at a time. Take short walks on level surfaces as soon as pain allows.Try to increase the length of time  you walk each day.  Do not stay in bed.Resting more than 1 or 2 days can delay your recovery.  Do not avoid exercise or work.Your body is made to move.It is not dangerous to be active, even though your back may hurt.Your back will likely heal faster if you return to being active before your pain is gone.  Pay attention to your body when you bend and lift. Many people have less discomfortwhen lifting if they bend their knees, keep the load close to their bodies,and avoid twisting. Often, the most comfortable positions are those that put less stress on your recovering back.  Find a comfortable position to sleep. Use a firm mattress and lie on your side with your knees slightly bent. If you lie on your back, put a pillow under your knees.  Only take over-the-counter or prescription medicines as directed by your caregiver. Over-the-counter medicines to reduce pain and inflammation are often the most helpful.Your caregiver may prescribe muscle relaxant drugs.These medicines help dull your pain so you can more quickly return to  your normal activities and healthy exercise.  Put ice on the injured area.  Put ice in a plastic bag.  Place a towel between your skin and the bag.  Leave the ice on for 15 to 20 minutes, 3 to 4 times a day for the first 2 to 3 days. After that, ice and heat may be alternated to reduce pain and spasms.  Ask your caregiver about trying back exercises and gentle massage. This may be of some benefit.  Avoid feeling anxious or stressed.Stress increases muscle tension and can worsen back pain.It is important to recognize when you are anxious or stressed and learn ways to manage it.Exercise is a great option. SEEK MEDICAL CARE IF:  You have pain that is not relieved with rest or medicine.  You have pain that does not improve in 1 week.  You have new symptoms.  You are generally not feeling well. SEEK IMMEDIATE MEDICAL CARE IF:   You have pain that radiates  from your back into your legs.  You develop new bowel or bladder control problems.  You have unusual weakness or numbness in your arms or legs.  You develop nausea or vomiting.  You develop abdominal pain.  You feel faint. Document Released: 07/04/2005 Document Revised: 01/03/2012 Document Reviewed: 11/22/2010 [facility] Patient Information 2013 [place], [place].

## 2012-11-30 NOTE — Progress Notes (Signed)
Subjective:     Patient ID: Jason Stokes, male   DOB: 1960/12/01, 52 y.o.   MRN: [id number]  HPI   Review of Systems     Objective:   Physical Exam     Assessment:         Plan:
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
## 2012-11-30 NOTE — Progress Notes (Signed)
Patient states here for chronic back pain

## 2012-12-21 ENCOUNTER — Encounter: Payer: Self-pay | Admitting: Physical Medicine & Rehabilitation

## 2012-12-21 ENCOUNTER — Ambulatory Visit (HOSPITAL_BASED_OUTPATIENT_CLINIC_OR_DEPARTMENT_OTHER): Payer: Medicaid Other | Admitting: Physical Medicine & Rehabilitation

## 2012-12-21 ENCOUNTER — Encounter: Payer: Medicaid Other | Attending: Physical Medicine & Rehabilitation

## 2012-12-21 VITALS — BP 133/73 | HR 56 | Resp 14 | Ht 70.0 in | Wt 246.0 lb

## 2012-12-21 DIAGNOSIS — Z5181 Encounter for therapeutic drug level monitoring: Secondary | ICD-10-CM

## 2012-12-21 DIAGNOSIS — M24559 Contracture, unspecified hip: Secondary | ICD-10-CM | POA: Insufficient documentation

## 2012-12-21 DIAGNOSIS — R252 Cramp and spasm: Secondary | ICD-10-CM

## 2012-12-21 DIAGNOSIS — R259 Unspecified abnormal involuntary movements: Secondary | ICD-10-CM | POA: Insufficient documentation

## 2012-12-21 DIAGNOSIS — Z79899 Other long term (current) drug therapy: Secondary | ICD-10-CM

## 2012-12-21 DIAGNOSIS — M25559 Pain in unspecified hip: Secondary | ICD-10-CM | POA: Insufficient documentation

## 2012-12-21 MED ORDER — BACLOFEN 10 MG PO TABS: 10.0000 mg | ORAL_TABLET | Freq: Two times a day (BID) | ORAL | Status: DC

## 2012-12-21 NOTE — Addendum Note (Signed)
Addended by: Judd Gaudier on: 12/21/2012 01:39 PM   Modules accepted: Orders
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
## 2012-12-21 NOTE — Patient Instructions (Signed)
I think your leg shaking is related to the brain cyst. I am repeating a CAT scan of your brain to see if there's anything new going on or whether it is just scar tissue.  I started you on muscle spasm medicine. We may need to adjust this dose.  We will also check hip films because of hip range of motion limitation and discomfort

## 2012-12-25 ENCOUNTER — Ambulatory Visit (HOSPITAL_COMMUNITY)
Admission: RE | Admit: 2012-12-25 | Discharge: 2012-12-25 | Disposition: A | Discharge status: Home / Self Care | Payer: Medicaid Other | Source: Ambulatory Visit | Attending: Physical Medicine & Rehabilitation | Admitting: Physical Medicine & Rehabilitation

## 2012-12-25 DIAGNOSIS — M549 Dorsalgia, unspecified: Secondary | ICD-10-CM | POA: Insufficient documentation

## 2012-12-25 DIAGNOSIS — M24559 Contracture, unspecified hip: Secondary | ICD-10-CM

## 2012-12-25 DIAGNOSIS — G8929 Other chronic pain: Secondary | ICD-10-CM | POA: Insufficient documentation

## 2012-12-25 DIAGNOSIS — R252 Cramp and spasm: Secondary | ICD-10-CM

## 2012-12-25 DIAGNOSIS — I6381 Other cerebral infarction due to occlusion or stenosis of small artery: Secondary | ICD-10-CM

## 2012-12-25 DIAGNOSIS — M25559 Pain in unspecified hip: Secondary | ICD-10-CM | POA: Insufficient documentation

## 2012-12-25 DIAGNOSIS — M5380 Other specified dorsopathies, site unspecified: Secondary | ICD-10-CM | POA: Insufficient documentation

## 2012-12-25 HISTORY — DX: Other cerebral infarction due to occlusion or stenosis of small artery: I63.81

## 2012-12-27 NOTE — Progress Notes (Signed)
Please make FYI note, [facility] is not prescribing narcotics

## 2012-12-28 ENCOUNTER — Ambulatory Visit: Payer: Medicaid Other | Attending: Family Medicine | Admitting: Family Medicine

## 2012-12-28 VITALS — BP 129/85 | HR 56 | Temp 97.9°F | Resp 15

## 2012-12-28 DIAGNOSIS — M549 Dorsalgia, unspecified: Secondary | ICD-10-CM | POA: Insufficient documentation

## 2012-12-28 NOTE — Progress Notes (Signed)
While escorting patient to lobby patient stated that he will come back and see the Doctor, Dr. Laural Benes,  that he saw last time. States that Dr. Laural Benes will give him his pain meds.

## 2012-12-28 NOTE — Progress Notes (Signed)
Patient here for follow up Results of ct scan  Pain medication

## 2012-12-28 NOTE — Progress Notes (Signed)
Subjective:     Patient ID: Jason Stokes, male   DOB: 15-Jan-1961, 52 y.o.   MRN: [id number]  HPI  Pt here asking about results of his CT scan and requesting narcotic pain medication.   Reviewed CT with him and asked why he had not asked [facility] for result, as they were the ones who ordered it. He said he did not know who he was supposed to get results from.   Pt c/o severe back pain (the same as he has had chronically) and said that [facility] was not adequately treating him pain. Said prior to going to [facility] he was getting narcotics from a physician here and he wanted to resume that. No change in sx from prior visits.    Review of Systems no loss bladder/bowel function or saddle parasthesias     Objective:   Physical Exam unable to obtain PE, as visit was cut short - see below     Assessment:     Back pain       Plan:     Explained to pt that we had sent him to [facility] for treatment of his pain, and that we were not treating his chronic pain here. Read to him from last visit:  "Will refill his pain meds until he can be seen by pain management and physical medicine. I explained to patient that this clinic is not equipped and will not be able to manage his pain chronically. The patient verbalized understanding."  Pt very upset, demanding to see prior physician. Using curse words, standing, and raising his voice.  Explained again that he needs to discuss his concerns regarding the management of his pain with his pain mgmt specialist and that we do not speak that way in this office. Pt was escorted out of clinic by male staff.   He had brought with him a thick tattered folder of attttered records from 2005. He wanted the records reviewed, saying they would explain why we needed to prescribe his pain pills. I told him I would look through and if anything were pertinent to his current mgmt, I would scan it into the chart.

## 2013-01-09 ENCOUNTER — Telehealth: Payer: Self-pay | Admitting: Family Medicine

## 2013-01-11 ENCOUNTER — Ambulatory Visit: Payer: Medicaid Other | Attending: Family Medicine | Admitting: Internal Medicine

## 2013-01-11 ENCOUNTER — Encounter: Payer: Self-pay | Admitting: Internal Medicine

## 2013-01-11 VITALS — BP 122/72 | HR 54 | Temp 98.0°F | Resp 16 | Ht 72.0 in | Wt 247.4 lb

## 2013-01-11 DIAGNOSIS — G894 Chronic pain syndrome: Secondary | ICD-10-CM

## 2013-01-11 DIAGNOSIS — M62838 Other muscle spasm: Secondary | ICD-10-CM

## 2013-01-11 MED ORDER — OXYCODONE-ACETAMINOPHEN 7.5-325 MG PO TABS: 1.0000 | ORAL_TABLET | Freq: Four times a day (QID) | ORAL | Status: DC | PRN
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
## 2013-01-11 NOTE — Progress Notes (Signed)
PT COMES IN FOR PAIN MANAGEMENT SECONDARY TO CHRONIC  BACK PAIN. PT IS FOLLOWING PAIN CLINIC,BUT RAN OUT OF OXYCODONE. HAS APPT WITH CLINIC NEXT WEEK

## 2013-01-11 NOTE — Patient Instructions (Signed)

## 2013-01-17 ENCOUNTER — Ambulatory Visit (HOSPITAL_COMMUNITY)
Admission: RE | Admit: 2013-01-17 | Discharge: 2013-01-17 | Disposition: A | Discharge status: Home / Self Care | Payer: Medicaid Other | Source: Ambulatory Visit | Attending: Physical Medicine & Rehabilitation | Admitting: Physical Medicine & Rehabilitation

## 2013-01-17 ENCOUNTER — Ambulatory Visit (HOSPITAL_BASED_OUTPATIENT_CLINIC_OR_DEPARTMENT_OTHER): Payer: Medicaid Other | Admitting: Physical Medicine & Rehabilitation

## 2013-01-17 ENCOUNTER — Encounter: Payer: Self-pay | Admitting: Physical Medicine & Rehabilitation

## 2013-01-17 ENCOUNTER — Encounter: Payer: Medicaid Other | Attending: Physical Medicine & Rehabilitation

## 2013-01-17 VITALS — BP 136/82 | HR 51 | Resp 16 | Ht 71.0 in | Wt 249.0 lb

## 2013-01-17 DIAGNOSIS — M719 Bursopathy, unspecified: Secondary | ICD-10-CM

## 2013-01-17 DIAGNOSIS — M24559 Contracture, unspecified hip: Secondary | ICD-10-CM | POA: Insufficient documentation

## 2013-01-17 DIAGNOSIS — M75100 Unspecified rotator cuff tear or rupture of unspecified shoulder, not specified as traumatic: Secondary | ICD-10-CM

## 2013-01-17 DIAGNOSIS — R259 Unspecified abnormal involuntary movements: Secondary | ICD-10-CM | POA: Insufficient documentation

## 2013-01-17 DIAGNOSIS — M25559 Pain in unspecified hip: Secondary | ICD-10-CM | POA: Insufficient documentation

## 2013-01-17 DIAGNOSIS — M67919 Unspecified disorder of synovium and tendon, unspecified shoulder: Secondary | ICD-10-CM

## 2013-01-17 DIAGNOSIS — M25519 Pain in unspecified shoulder: Secondary | ICD-10-CM | POA: Insufficient documentation

## 2013-01-17 DIAGNOSIS — M62838 Other muscle spasm: Secondary | ICD-10-CM

## 2013-01-17 DIAGNOSIS — M898X9 Other specified disorders of bone, unspecified site: Secondary | ICD-10-CM | POA: Insufficient documentation

## 2013-01-17 DIAGNOSIS — G8929 Other chronic pain: Secondary | ICD-10-CM | POA: Insufficient documentation

## 2013-01-17 NOTE — Progress Notes (Signed)
Subjective:    Patient ID: Jason Stokes, male    DOB: 12-May-1961, 52 y.o.   MRN: [id number] Chief complaint bilateral shoulder pain HPI History of fall 2 years ago. Think he may have injured his shoulders. 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 No other abnormalities. No numbness or tingling in his shoulders.  Discussed urine drug screen. Positive cocaine, positive THC. Admits to [facility] but denies cocaine.  Discussed right lower extremity jumping. CT head June 2014 demonstrated remote right basal ganglia infarct MRI lumbar spine 06/02/2011 was essentially normal  Patient thinks his right lower extremity problems started after epidural injection in 2005, I explained that this is very unlikely. Pain Inventory Average Pain 9 Pain Right Now na My pain is sharp and aching  In the last 24 hours, has pain interfered with the following? General activity 10 Relation with others 8 Enjoyment of life 10 What TIME of day is your pain at its worst? day,evening,night Sleep (in general) Poor  Pain is worse with: walking, bending, sitting, standing and some activites Pain improves with: medication Relief from Meds: 1  Mobility use a cane do you drive?  no  Function I need assistance with the following:  bathing, household duties and shopping  Neuro/Psych numbness tremor tingling spasms depression anxiety  Prior Studies Any changes since last visit?  no  Physicians involved in your care Any changes since last visit?  no   History reviewed. No pertinent family history. History   Social History  . Marital Status: Single    Spouse Name: N/A    Number of Children: N/A  . Years of Education: N/A   Social History Main Topics  . Smoking status: Former Smoker -- 0.50 packs/day    Types: Cigarettes    Quit date: 11/29/2012  . Smokeless tobacco: Never Used  . Alcohol Use: Yes     Comment: on occasion  . Drug Use: No  .  Sexually Active: Yes   Other Topics Concern  . None   Social History Narrative  . None   History reviewed. No pertinent past surgical history. Past Medical History  Diagnosis Date  . Chronic back pain   . Hypertension    BP 136/82  Pulse 51  Resp 16  Ht 5\' 11"  (1.803 m)  Wt 249 lb (112.946 kg)  BMI 34.74 kg/m2  SpO2 98%     Review of Systems  Constitutional: Positive for unexpected weight change.  Respiratory: Positive for apnea.   Gastrointestinal: Positive for constipation.  Neurological: Positive for tremors and numbness.       Tingling. spasms   Psychiatric/Behavioral: Positive for dysphoric mood and agitation.  All other systems reviewed and are negative.       Objective:   Physical Exam  Constitutional: He is oriented to person, place, and time.  Neurological: He is alert and oriented to person, place, and time. He has normal strength and normal reflexes. No sensory deficit. He displays no Babinski's sign on the right side.  Psychiatric: He has a normal mood and affect.    Ambulates without evidence of toe drag R. knee instability.  Bilateral shoulder impingement signs, positive Neers, positive Hawkins No numbness in his shoulder area. Normal strength in the deltoids. Decreased range of motion within internal and external rotation of both shoulders    Assessment & Plan:  1. Intermittent shaking of the right lower extremity. I witnessed this last visit but not this visit. He  thinks the baclofen 10 mg 3 times a day may be helping some what. There is no obvious caused to this. CT of the head shows no obvious left-sided stroke. MRI of the lumbar spine shows no problem in the distal spinal cord. I will refer patient to neurology. He may need some further imaging such as thoracic spine MRI but I will deferred to them on further workup  2. Chronic low back pain. I discussed that due to urine drug screen results not good candidate for narcotic analgesics. His MRI  was unremarkable. most likely benefit from physical therapy.  3. Shoulder pain with positive impingement testing x-rays show a.c. joint arthropathy. I suspect some rotator cuff pathology. Will refer to physical therapy

## 2013-01-17 NOTE — Patient Instructions (Signed)
Cocaine and THC in urine  Referral to neurologist

## 2013-01-29 ENCOUNTER — Ambulatory Visit: Payer: Medicaid Other | Admitting: Physical Therapy

## 2013-01-30 ENCOUNTER — Ambulatory Visit: Payer: Medicaid Other

## 2013-01-30 ENCOUNTER — Encounter (HOSPITAL_COMMUNITY): Payer: Self-pay | Admitting: Emergency Medicine

## 2013-01-30 ENCOUNTER — Emergency Department (HOSPITAL_COMMUNITY)
Admission: EM | Admit: 2013-01-30 | Discharge: 2013-01-30 | Disposition: A | Discharge status: Home / Self Care | Payer: Medicaid Other | Attending: Emergency Medicine | Admitting: Emergency Medicine

## 2013-01-30 ENCOUNTER — Emergency Department (HOSPITAL_COMMUNITY): Payer: Medicaid Other

## 2013-01-30 DIAGNOSIS — Z79899 Other long term (current) drug therapy: Secondary | ICD-10-CM | POA: Insufficient documentation

## 2013-01-30 DIAGNOSIS — M545 Low back pain, unspecified: Secondary | ICD-10-CM | POA: Insufficient documentation

## 2013-01-30 DIAGNOSIS — R209 Unspecified disturbances of skin sensation: Secondary | ICD-10-CM | POA: Insufficient documentation

## 2013-01-30 DIAGNOSIS — G8911 Acute pain due to trauma: Secondary | ICD-10-CM | POA: Insufficient documentation

## 2013-01-30 DIAGNOSIS — Z87891 Personal history of nicotine dependence: Secondary | ICD-10-CM | POA: Insufficient documentation

## 2013-01-30 DIAGNOSIS — M549 Dorsalgia, unspecified: Secondary | ICD-10-CM

## 2013-01-30 DIAGNOSIS — G8929 Other chronic pain: Secondary | ICD-10-CM | POA: Insufficient documentation

## 2013-01-30 DIAGNOSIS — I10 Essential (primary) hypertension: Secondary | ICD-10-CM | POA: Insufficient documentation

## 2013-01-30 MED ORDER — OXYCODONE-ACETAMINOPHEN 5-325 MG PO TABS
2.0000 | ORAL_TABLET | Freq: Once | ORAL | Status: AC
  Administered 2013-01-30: 2 via ORAL
  Filled 2013-01-30: qty 2

## 2013-01-30 NOTE — ED Notes (Signed)
PA at bedside.

## 2013-01-30 NOTE — ED Provider Notes (Signed)
Medical screening examination/treatment/procedure(s) were performed by non-physician practitioner and as supervising physician I was immediately available for consultation/collaboration.  Deshana Rominger, MD 01/30/13 2338 

## 2013-01-30 NOTE — ED Notes (Signed)
Pt left dept after speaking with Dr Juleen China- pt unable to sign for d/c papers in computer due to downtime- refused to sign paper form- also refused to go to d/c window because "it was too far to walk"

## 2013-01-30 NOTE — ED Notes (Signed)
Pt complains of back pain  ? ?

## 2013-01-30 NOTE — ED Provider Notes (Signed)
History    This chart was scribed for a non-physician practitioner working with Raeford Razor, MD by Jiles Prows, ED scribe. This patient was seen in room WTR7/WTR7 and the patient's care was started at 7:16 PM.  CSN: [id number] Arrival date & time 01/30/13  1844   Chief Complaint  Patient presents with  . Back Pain   The history is provided by the patient and medical records. No language interpreter was used.   HPI Comments: Jason Stokes is a 52 y.o. male with chronic back pain and HTN who presents to the Emergency Department complaining of moderate, constant back pain onset 2012.  He reports that he fell in a hole in 2012, and has been dealing with the pain since then.  He states that today he stood up and felt that his spine "open up."  He states that he feels that his nerves are "caught up in it."  He claims tingling and numbness.  He claims he cannot stand up and walk onset today.  He reports that the pain radiates down his left leg to his left foot.  Pt asserts that this is a nerve problem, and that it is totally new.  Pt denies headache, diaphoresis, fever, chills, nausea, vomiting, diarrhea, weakness, cough, SOB and any other pain. He reports he will see a neuro specialist on August 8th.  Pt denies h/o cancer. No bowel or bladder incontinence.   Pt reports that he takes narcotics.  Pt denies use of street drugs, specifically IV.    Past Medical History  Diagnosis Date  . Chronic back pain   . Hypertension    History reviewed. No pertinent past surgical history. No family history on file. History  Substance Use Topics  . Smoking status: Former Smoker -- 0.50 packs/day    Types: Cigarettes    Quit date: 11/29/2012  . Smokeless tobacco: Never Used  . Alcohol Use: Yes     Comment: on occasion    Review of Systems  Musculoskeletal: Positive for back pain.  Neurological: Positive for numbness.  All other systems reviewed and are negative.   Allergies  Lisinopril  Home  Medications   Current Outpatient Rx  Name  Route  Sig  Dispense  Refill  . atenolol (TENORMIN) 50 MG tablet   Oral   Take 50 mg by mouth daily.         . baclofen (LIORESAL) 10 MG tablet   Oral   Take 1 tablet (10 mg total) by mouth 2 (two) times daily.   60 each   1   . gabapentin (NEURONTIN) 300 MG capsule   Oral   Take 1 capsule (300 mg total) by mouth at bedtime.   30 capsule   3   . HYDROcodone-acetaminophen (NORCO) 10-325 MG per tablet   Oral   Take 1 tablet by mouth every 4 (four) hours as needed for pain (after teeth removal).         Marland Kitchen ibuprofen (ADVIL,MOTRIN) 200 MG tablet   Oral   Take 200 mg by mouth every 6 (six) hours as needed. pain          . oxyCODONE-acetaminophen (PERCOCET) 7.5-325 MG per tablet   Oral   Take 1 tablet by mouth every 6 (six) hours as needed for pain.   30 tablet   0   . traMADol (ULTRAM) 50 MG tablet   Oral   Take 50 mg by mouth every 6 (six) hours as needed for pain.         Marland Kitchen  traZODone (DESYREL) 50 MG tablet   Oral   Take 100 mg by mouth at bedtime.          . Vortioxetine HBr (BRINTELLIX) 10 MG TABS   Oral   Take 10 mg by mouth daily.           BP 158/90  Pulse 60  Temp(Src) 98.3 F (36.8 C) (Oral)  Resp 18  SpO2 96% Physical Exam  Nursing note and vitals reviewed. Constitutional: He is oriented to person, place, and time. He appears well-developed and well-nourished. No distress.  HENT:  Head: Normocephalic and atraumatic.  Right Ear: External ear normal.  Left Ear: External ear normal.  Nose: Nose normal.  Eyes: Conjunctivae are normal.  Neck: Normal range of motion. No tracheal deviation present.  Cardiovascular: Normal rate, regular rhythm, normal heart sounds, intact distal pulses and normal pulses.   Pulmonary/Chest: Effort normal and breath sounds normal. No stridor.  Abdominal: Soft. He exhibits no distension. There is no tenderness.  Musculoskeletal: Normal range of motion. He exhibits  tenderness.  Tenderness to palpation of thoracic spine and lumbar spine.  Strength normal in lower extremities.  Neurovascularly intact.  Neurological: He is alert and oriented to person, place, and time.  Skin: Skin is warm and dry. He is not diaphoretic.  Psychiatric: He has a normal mood and affect. His behavior is normal.    ED Course  Procedures (including critical care time) DIAGNOSTIC STUDIES: Oxygen Saturation is 96% on RA, adequate by my interpretation.    COORDINATION OF CARE: 7:21 PM - Discussed ED treatment with pt at bedside including x-ray and pt agrees.    8:19 PM - Recheck.  Pt appeared to be laying in the bed, comfortably talking on the phone until we entered the room.  Pt advised that x-rays were normal, and is advised to take Advil and tylenol.  Pt offered tramadol, and he denied.  Advised pt follow up with pain management.  Pt offered percocet in ED, but was advised he would not be sent home with a prescription. Pt states that narcotics have cocaine in them and that his pain management doctor is wrong. Pt offered referral to different pain management clinic.  Pt is verbally aggressive, yelling.  Labs Reviewed - No data to display Dg Thoracic Spine 2 View  01/30/2013   *RADIOLOGY REPORT*  Clinical Data: Back pain.  No injury.  THORACIC SPINE - 2 VIEW  Comparison: 11/15/2012and lumbar spine 01/30/2013  Findings: Normal alignment of the thoracic spine.  The swimmer's view demonstrates disc space narrowing at C3-C4. Vertebral body heights are maintained.  IMPRESSION: No acute bony abnormality to the thoracic spine.  Cervical spondylosis at C3-C4.   Original Report Authenticated By: Richarda Overlie, M.D.   Dg Lumbar Spine Complete  01/30/2013   *RADIOLOGY REPORT*  Clinical Data: Back pain.  LUMBAR SPINE - COMPLETE 4+ VIEW  Comparison: MRI 06/02/2011 and lumbar spine study 07/27/2007  Findings: AP, lateral and oblique images of the lumbar spine were obtained.  There is a bridging  osteophyte along the right side of L3-L4.  Alignment of the lumbar spine is normal.  Vertebral body heights are maintained.  Mild degenerative endplate changes at L4 and L5.  IMPRESSION: Mild degenerative changes.  No acute bony abnormality.   Original Report Authenticated By: Richarda Overlie, M.D.   1. Chronic low back pain   2. Back pain     MDM  Patient presents with chronic back pain. Neurovascularly intact. No focal neuro  deficits. No bowel or bladder incontinence, hx of CA, IVDA. Patient states he only uses narcotics PO. Patient was evaluated by Dr. Juleen China as well. Discussed follow up with neurosurgery at scheduled appointment. Call pain management and see if you can get an earlier appointment. Return instructions given. Vital signs stable for discharge. Patient / Family / Caregiver informed of clinical course, understand medical decision-making process, and agree with plan.   I personally performed the services described in this documentation, which was scribed in my presence. The recorded information has been reviewed and is accurate.     Mora Bellman, PA-C 01/30/13 2225

## 2013-01-30 NOTE — ED Notes (Signed)
Attempted to d/c patient- pt states "i need to see a real doctor not an intern". Dr Juleen China notified and at bedside to assess pt

## 2013-02-04 ENCOUNTER — Ambulatory Visit: Payer: Medicaid Other | Admitting: Physical Therapy

## 2013-02-11 ENCOUNTER — Ambulatory Visit: Payer: Medicaid Other

## 2013-02-14 ENCOUNTER — Ambulatory Visit: Payer: Medicaid Other | Attending: Physical Medicine & Rehabilitation

## 2013-02-15 ENCOUNTER — Ambulatory Visit: Payer: Medicaid Other | Attending: Family Medicine | Admitting: Internal Medicine

## 2013-02-15 VITALS — BP 149/96 | HR 93 | Temp 98.5°F | Resp 18 | Wt 247.6 lb

## 2013-02-15 DIAGNOSIS — M545 Low back pain, unspecified: Secondary | ICD-10-CM | POA: Insufficient documentation

## 2013-02-15 DIAGNOSIS — M549 Dorsalgia, unspecified: Secondary | ICD-10-CM

## 2013-02-15 DIAGNOSIS — G8929 Other chronic pain: Secondary | ICD-10-CM | POA: Insufficient documentation

## 2013-02-15 DIAGNOSIS — I10 Essential (primary) hypertension: Secondary | ICD-10-CM

## 2013-02-15 LAB — COMPREHENSIVE METABOLIC PANEL
ALT: 16 U/L (ref 0–53)
AST: 17 U/L (ref 0–37)
Albumin: 4.2 g/dL (ref 3.5–5.2)
Alkaline Phosphatase: 62 U/L (ref 39–117)
BUN: 13 mg/dL (ref 6–23)
CO2: 29 mEq/L (ref 19–32)
Calcium: 9.4 mg/dL (ref 8.4–10.5)
Chloride: 106 mEq/L (ref 96–112)
Creat: 1.31 mg/dL (ref 0.50–1.35)
Glucose, Bld: 89 mg/dL (ref 70–99)
Potassium: 4.2 mEq/L (ref 3.5–5.3)
Sodium: 141 mEq/L (ref 135–145)
Total Bilirubin: 0.5 mg/dL (ref 0.3–1.2)
Total Protein: 7.2 g/dL (ref 6.0–8.3)

## 2013-02-15 LAB — CBC WITH DIFFERENTIAL/PLATELET
Basophils Absolute: 0 10*3/uL (ref 0.0–0.1)
Basophils Relative: 0 % (ref 0–1)
Eosinophils Absolute: 0.2 10*3/uL (ref 0.0–0.7)
Eosinophils Relative: 3 % (ref 0–5)
HCT: 42.6 % (ref 39.0–52.0)
Hemoglobin: 13.6 g/dL (ref 13.0–17.0)
Lymphocytes Relative: 38 % (ref 12–46)
Lymphs Abs: 1.8 10*3/uL (ref 0.7–4.0)
MCH: 24.2 pg — ABNORMAL LOW (ref 26.0–34.0)
MCHC: 31.9 g/dL (ref 30.0–36.0)
MCV: 75.7 fL — ABNORMAL LOW (ref 78.0–100.0)
Monocytes Absolute: 0.4 10*3/uL (ref 0.1–1.0)
Monocytes Relative: 9 % (ref 3–12)
Neutro Abs: 2.3 10*3/uL (ref 1.7–7.7)
Neutrophils Relative %: 50 % (ref 43–77)
Platelets: 204 10*3/uL (ref 150–400)
RBC: 5.63 MIL/uL (ref 4.22–5.81)
RDW: 16.8 % — ABNORMAL HIGH (ref 11.5–15.5)
WBC: 4.6 10*3/uL (ref 4.0–10.5)

## 2013-02-15 MED ORDER — TRAZODONE HCL 50 MG PO TABS: 100.0000 mg | ORAL_TABLET | Freq: Every day | ORAL | Status: DC

## 2013-02-15 MED ORDER — PREDNISONE 20 MG PO TABS: 40.0000 mg | ORAL_TABLET | Freq: Every day | ORAL | Status: AC

## 2013-02-15 MED ORDER — ATENOLOL 50 MG PO TABS: 50.0000 mg | ORAL_TABLET | Freq: Every day | ORAL | Status: DC

## 2013-02-15 MED ORDER — TRAMADOL HCL 50 MG PO TABS: 50.0000 mg | ORAL_TABLET | Freq: Four times a day (QID) | ORAL | Status: DC | PRN

## 2013-02-15 MED ORDER — BACLOFEN 10 MG PO TABS: 10.0000 mg | ORAL_TABLET | Freq: Two times a day (BID) | ORAL | Status: DC

## 2013-02-15 MED ORDER — OXYCODONE-ACETAMINOPHEN 7.5-325 MG PO TABS: 1.0000 | ORAL_TABLET | Freq: Four times a day (QID) | ORAL | Status: DC | PRN

## 2013-02-15 MED ORDER — GABAPENTIN 300 MG PO CAPS: 300.0000 mg | ORAL_CAPSULE | Freq: Every day | ORAL | Status: DC

## 2013-02-15 NOTE — Progress Notes (Signed)
Patient ID: Jason Stokes, male   DOB: 01/01/1961, 52 y.o.   MRN: [id number]  CC:  HPI:  52 year old male who presents for evaluation of his back pain, he was recently in the ER on 7/16, complained of pain and tingling and numbness radiating into his right leg. The patient denied any bowel or bladder incontinence. He was found to be neurovascularly intact, he has an upcoming appointment with neurology, he also saw Dr. Quita Skye on 7/3. During this interview the patient and jumping of his right lower extremity on 2 occasions Previous urine drug screen positive for cocaine and marijuana Last MRI of the lumbar spine was 06/02/2011 CT of the head done on 6/6 results discussed with the patient and copy of the CT scan provided  Allergies  Allergen Reactions  . Lisinopril Anaphylaxis    Swelling of lips and tongue.   Past Medical History  Diagnosis Date  . Chronic back pain   . Hypertension    Current Outpatient Prescriptions on File Prior to Visit  Medication Sig Dispense Refill  . HYDROcodone-acetaminophen (NORCO) 10-325 MG per tablet Take 1 tablet by mouth every 4 (four) hours as needed for pain (after teeth removal).      Marland Kitchen ibuprofen (ADVIL,MOTRIN) 200 MG tablet Take 200 mg by mouth every 6 (six) hours as needed. pain       . oxyCODONE-acetaminophen (PERCOCET) 7.5-325 MG per tablet Take 1 tablet by mouth every 6 (six) hours as needed for pain.  30 tablet  0  . Vortioxetine HBr (BRINTELLIX) 10 MG TABS Take 10 mg by mouth daily.        No current facility-administered medications on file prior to visit.   History reviewed. No pertinent family history. History   Social History  . Marital Status: Single    Spouse Name: N/A    Number of Children: N/A  . Years of Education: N/A   Occupational History  . Not on file.   Social History Main Topics  . Smoking status: Former Smoker -- 0.50 packs/day    Types: Cigarettes    Quit date: 11/29/2012  . Smokeless tobacco: Never Used   . Alcohol Use: Yes     Comment: on occasion  . Drug Use: No  . Sexually Active: Yes   Other Topics Concern  . Not on file   Social History Narrative  . No narrative on file    Review of Systems  Constitutional: Negative for fever, chills, diaphoresis, activity change, appetite change and fatigue.  HENT: Negative for ear pain, nosebleeds, congestion, facial swelling, rhinorrhea, neck pain, neck stiffness and ear discharge.   Eyes: Negative for pain, discharge, redness, itching and visual disturbance.  Respiratory: Negative for cough, choking, chest tightness, shortness of breath, wheezing and stridor.   Cardiovascular: Negative for chest pain, palpitations and leg swelling.  Gastrointestinal: Negative for abdominal distention.  Genitourinary: Negative for dysuria, urgency, frequency, hematuria, flank pain, decreased urine volume, difficulty urinating and dyspareunia.  Musculoskeletal: Negative for back pain, joint swelling, arthralgias and gait problem.  Neurological: Negative for dizziness, tremors, seizures, syncope, facial asymmetry, speech difficulty, weakness, light-headedness, numbness and headaches.  Hematological: Negative for adenopathy. Does not bruise/bleed easily.  Psychiatric/Behavioral: Negative for hallucinations, behavioral problems, confusion, dysphoric mood, decreased concentration and agitation.    Objective:   Filed Vitals:   02/15/13 1158  BP: 149/96  Pulse: 93  Temp: 98.5 F (36.9 C)  Resp: 18    Physical Exam  Constitutional: Appears well-developed and well-nourished. No  distress.  HENT: Normocephalic. External right and left ear normal. Oropharynx is clear and moist.  Eyes: Conjunctivae and EOM are normal. PERRLA, no scleral icterus.  Neck: Normal ROM. Neck supple. No JVD. No tracheal deviation. No thyromegaly.  CVS: RRR, S1/S2 +, no murmurs, no gallops, no carotid bruit.  Pulmonary: Effort and breath sounds normal, no stridor, rhonchi, wheezes,  rales.  Abdominal: Soft. BS +,  no distension, tenderness, rebound or guarding.  Musculoskeletal: Normal range of motion. No edema and no tenderness.  Lymphadenopathy: No lymphadenopathy noted, cervical, inguinal. Neuro: Alert. Normal reflexes, muscle tone coordination. No cranial nerve deficit. Skin: Skin is warm and dry. No rash noted. Not diaphoretic. No erythema. No pallor.  Psychiatric: Normal mood and affect. Behavior, judgment, thought content normal.   Lab Results  Component Value Date   WBC 5.6 03/01/2010   HGB 13.6 03/01/2010   HCT 42.0 03/01/2010   MCV 78.3 03/01/2010   PLT 237 03/01/2010   Lab Results  Component Value Date   CREATININE 1.27 03/01/2010   BUN 9 03/01/2010   NA 137 03/01/2010   K 3.6 03/01/2010   CL 103 03/01/2010   CO2 27 03/01/2010    No results found for this basename: HGBA1C   Lipid Panel  No results found for this basename: chol, trig, hdl, cholhdl, vldl, ldlcalc       Assessment and plan:   Patient Active Problem List   Diagnosis Date Noted  . Chronic low back pain 10/12/2012  . Sciatica neuralgia 10/12/2012  . Hypertension 10/12/2012  . Depression 10/12/2012  . Insomnia 10/12/2012  . Allergic rhinitis 10/12/2012       Intermittent shaking of the right lower extremity next the patient on baclofen all medications refilled except for narcotic medications Advised that the patient should have an MRI of the thoracic and the lumbar spine He should followup with neurology as scheduled He may need an EMG or nerve conduction study He has been started on a five-day course of oral prednisone  Routine followup in 3 weeks

## 2013-02-15 NOTE — Progress Notes (Signed)
Patient here for chronic back pain Stated went to pain management at [facility] long And they didn't do anything for him Xray showed nothing according to the patient Would like results of CT scan of his head  Referral to eye dr as well

## 2013-02-21 ENCOUNTER — Ambulatory Visit (HOSPITAL_COMMUNITY)
Admission: RE | Admit: 2013-02-21 | Discharge: 2013-02-21 | Disposition: A | Discharge status: Home / Self Care | Payer: Medicaid Other | Source: Ambulatory Visit | Attending: Internal Medicine | Admitting: Internal Medicine

## 2013-02-21 ENCOUNTER — Ambulatory Visit (HOSPITAL_COMMUNITY): Admission: RE | Admit: 2013-02-21 | Payer: Medicaid Other | Source: Ambulatory Visit

## 2013-02-21 DIAGNOSIS — M5126 Other intervertebral disc displacement, lumbar region: Secondary | ICD-10-CM | POA: Insufficient documentation

## 2013-02-21 DIAGNOSIS — I10 Essential (primary) hypertension: Secondary | ICD-10-CM

## 2013-02-21 DIAGNOSIS — M5124 Other intervertebral disc displacement, thoracic region: Secondary | ICD-10-CM | POA: Insufficient documentation

## 2013-02-21 DIAGNOSIS — M549 Dorsalgia, unspecified: Secondary | ICD-10-CM

## 2013-02-22 ENCOUNTER — Encounter: Payer: Self-pay | Admitting: Neurology

## 2013-02-22 ENCOUNTER — Ambulatory Visit (INDEPENDENT_AMBULATORY_CARE_PROVIDER_SITE_OTHER): Payer: Medicaid Other | Admitting: Neurology

## 2013-02-22 VITALS — BP 144/85 | HR 64 | Ht 71.0 in | Wt 251.0 lb

## 2013-02-22 DIAGNOSIS — M545 Low back pain: Secondary | ICD-10-CM

## 2013-02-22 DIAGNOSIS — G8929 Other chronic pain: Secondary | ICD-10-CM

## 2013-02-22 DIAGNOSIS — M5430 Sciatica, unspecified side: Secondary | ICD-10-CM

## 2013-02-22 DIAGNOSIS — M5431 Sciatica, right side: Secondary | ICD-10-CM

## 2013-02-22 MED ORDER — GABAPENTIN 300 MG PO CAPS: 300.0000 mg | ORAL_CAPSULE | Freq: Three times a day (TID) | ORAL | Status: DC

## 2013-02-22 NOTE — Progress Notes (Addendum)
Reason for visit: Back pain  Jason Stokes is a 52 y.o. male  History of present illness:  Mr. Jason Stokes is a 52 year old right-handed black male with a chronic history of low back pain. The patient indicates that he has had injections in his back dating back to 2005. The patient reinjured his back when he stepped in a hole and 2012, and he has had increased pain since that time. The patient has been wearing a lumbar support device. The patient is on very low-dose gabapentin, taking 300 mg at bedtime. The patient has had a recent MRI evaluation of the lumbosacral spine and thoracic spine. The lumbosacral MRI revealed evidence of a subligamentous disc herniation at the L4-5 level, and the thoracic spine MRI showed a small disc herniation at the T 6-7 level. No spinal cord compression was seen. The patient indicates that he has pain down both legs, right greater than left. The patient has pain into the right big toe, and tingling down both legs to the feet. The patient believes that both legs are weak. The patient denies problems controlling the bowels or the bladder. The patient walks with a cane, and he denies any recent falls. 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 The patient also indicates that he is having multiple episodes of tremors in the right leg. The patient is sent for further evaluation.  Past Medical History  Diagnosis Date  . Chronic back pain   . Hypertension   . Depression   . Small vessel disease     Right basal ganglia stroke  . History of alcohol abuse     Past Surgical History  Procedure Laterality Date  . Intercostal nerve block  2005  . Surgery back of head  1996-97    Family History  Problem Relation Age of Onset  . Heart disease Father     Social history:  reports that he has been smoking Cigarettes.  He has  been smoking about 0.30 packs per day. He has never used smokeless tobacco. He reports that  drinks alcohol. He reports that he uses illicit drugs (Marijuana and Cocaine).  Medications:  Current Outpatient Prescriptions on File Prior to Visit  Medication Sig Dispense Refill  . atenolol (TENORMIN) 50 MG tablet Take 1 tablet (50 mg total) by mouth daily.  90 tablet  2  . baclofen (LIORESAL) 10 MG tablet Take 1 tablet (10 mg total) by mouth 2 (two) times daily.  60 each  1  . HYDROcodone-acetaminophen (NORCO) 10-325 MG per tablet Take 1 tablet by mouth every 4 (four) hours as needed for pain (after teeth removal).      Marland Kitchen oxyCODONE-acetaminophen (PERCOCET) 7.5-325 MG per tablet Take 1 tablet by mouth every 6 (six) hours as needed for pain.  20 tablet  0  . traMADol (ULTRAM) 50 MG tablet Take 1 tablet (50 mg total) by mouth every 6 (six) hours as needed for pain.  30 tablet  0  . traZODone (DESYREL) 50 MG tablet Take 2 tablets (100 mg total) by mouth at bedtime.  30 tablet  0   No current facility-administered medications on file prior to visit.    Allergies:  Allergies  Allergen Reactions  . Lisinopril Anaphylaxis    Swelling of lips and tongue.    ROS:  Out of a complete 14  system review of symptoms, the patient complains only of the following symptoms, and all other reviewed systems are negative.  Weight gain, weight loss, fatigue Ringing in the ears Moles Blurred vision, eye pain Constipation Feeling hot, cold, increased thirst Joint pain, achy muscles Allergies Memory loss, confusion, headache, numbness, weakness, slurred speech, dizziness Depression, insomnia, change in appetite, disinterest in activities Sleepiness, snoring, restless legs  Blood pressure 144/85, pulse 64, height 5\' 11"  (1.803 m), weight 251 lb (113.853 kg).  Physical Exam  General: The patient is alert and cooperative at the time of the examination.  Head: Pupils are equal, round, and reactive to light.  Discs are flat bilaterally.  Neck: The neck is supple, no carotid bruits are noted.  Respiratory: The respiratory examination is clear.  Cardiovascular: The cardiovascular examination reveals a regular rate and rhythm, no obvious murmurs or rubs are noted.  Neuromuscular: The patient is only able to flex about 10 with the low back. The patient has minimal rotational movement.  Skin: Extremities are without significant edema.  Neurologic Exam  Mental status:  Cranial nerves: Facial symmetry is present. There is good sensation of the face to pinprick and soft touch bilaterally. The strength of the facial muscles and the muscles to head turning and shoulder shrug are normal bilaterally. Speech is well enunciated, no aphasia or dysarthria is noted. Extraocular movements are full. Visual fields are full.  Motor: The motor testing reveals 5 over 5 strength of all 4 extremities. Good symmetric motor tone is noted throughout. The patient is able to walk on heels and the toes bilaterally.  Sensory: Sensory testing is intact to pinprick, soft touch, vibration sensation, and position sense on all 4 extremities, with the exception that there is a decrease in pinprick sensation and vibration sensation on the right arm and right leg. No evidence of extinction is noted.  Coordination: Cerebellar testing reveals good finger-nose-finger and heel-to-shin bilaterally.  Gait and station: The patient has a slow, limping gait. The patient walks with a cane.. Tandem gait is slightly unsteady. Romberg is negative. No drift is seen.  Reflexes: Deep tendon reflexes are symmetric and normal bilaterally. Toes are downgoing bilaterally.    MRI lumbar evaluation:  IMPRESSION:  1. New small central subligamentous disc protrusion at L4-5 in the  midline without focal neural impingement.  2. Minimal stable disc bulge at L3-4.  MRI thoracic evaluation:  IMPRESSION:  1. Multiple small disc bulges and  protrusions. Only the small  protrusion at T6-7 slightly deforms the spinal cord without  myelopathy.  2. Otherwise, normal exam.   Assessment/Plan:  1. Chronic low back pain, bilateral lower extremity pain  2. 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 "Tremors", right lower extremity  4. Cerebrovascular disease, right basal ganglia stroke  The patient will be set up for further evaluation. The episodes of extension and tremor of the right leg could be voluntarily induced. Flexion of the leg results in cessation of the tremors immediately. The episodes are not likely to represent seizures therefore. This could potentially be seen with spasticity, and for this reason, the patient will be set up for MRI evaluation of the cervical spine. The patient will undergo nerve conduction studies of both legs, and the right arm. The patient will undergo EMG evaluation of both legs. The gabapentin dosing will be increased to 300 mg 3 times daily. In the future, the patient may require a pain center referral. A subligamentous disc herniation would not be expected to cause pain down  the legs. The patient may benefit from an epidural steroid injection in the future. The right-sided numbness does not correlate with the right brain stroke.  Marlan Palau MD 02/23/2013 10:53 AM  [facility] [address] [place], [place] [address]  Phone [phone] Fax [phone]

## 2013-02-27 ENCOUNTER — Encounter: Payer: Self-pay | Admitting: Neurology

## 2013-03-08 ENCOUNTER — Ambulatory Visit (INDEPENDENT_AMBULATORY_CARE_PROVIDER_SITE_OTHER): Payer: Medicaid Other | Admitting: Neurology

## 2013-03-08 ENCOUNTER — Ambulatory Visit: Payer: Medicaid Other | Attending: Internal Medicine | Admitting: Internal Medicine

## 2013-03-08 ENCOUNTER — Encounter (INDEPENDENT_AMBULATORY_CARE_PROVIDER_SITE_OTHER): Payer: Medicaid Other

## 2013-03-08 VITALS — BP 113/73 | HR 62 | Temp 98.2°F | Resp 16 | Wt 245.8 lb

## 2013-03-08 DIAGNOSIS — M5431 Sciatica, right side: Secondary | ICD-10-CM

## 2013-03-08 DIAGNOSIS — G8929 Other chronic pain: Secondary | ICD-10-CM

## 2013-03-08 DIAGNOSIS — Z0289 Encounter for other administrative examinations: Secondary | ICD-10-CM

## 2013-03-08 DIAGNOSIS — M79609 Pain in unspecified limb: Secondary | ICD-10-CM

## 2013-03-08 DIAGNOSIS — M545 Low back pain, unspecified: Secondary | ICD-10-CM | POA: Insufficient documentation

## 2013-03-08 DIAGNOSIS — M5430 Sciatica, unspecified side: Secondary | ICD-10-CM

## 2013-03-08 DIAGNOSIS — G894 Chronic pain syndrome: Secondary | ICD-10-CM | POA: Insufficient documentation

## 2013-03-08 MED ORDER — PREDNISONE 20 MG PO TABS: 20.0000 mg | ORAL_TABLET | Freq: Every day | ORAL | Status: DC

## 2013-03-08 MED ORDER — OXYCODONE-ACETAMINOPHEN 7.5-325 MG PO TABS: 1.0000 | ORAL_TABLET | Freq: Four times a day (QID) | ORAL | Status: DC | PRN

## 2013-03-08 NOTE — Progress Notes (Signed)
Jason Stokes is a 53 year old gentleman with a history of chronic low back pain, and bilateral lower extremity pain. The patient returns for EMG and nerve conduction study today. This Study showed evidence of a mild right carpal tunnel syndrome, no evidence of a lumbosacral radiculopathy on either side. The patient has a radial disc herniation, and he'll be set up for an epidural steroid injection. MRI of the cervical spine is pending.

## 2013-03-08 NOTE — Progress Notes (Signed)
Patient ID: Jason Stokes, male   DOB: 06-Dec-1960, 52 y.o.   MRN: [id number]  CC: Followup  HPI: Patient is here for followup visit. He is a known patient in the clinic who comes in regularly for chronic low back pain. Recently fired from pain clinic because of positive urine drug test for cocaine, was seen by a neurologist and has an appointment next week for possible nerve block, he has dislocation of shakiness of the right leg, as if in spasm. He had 2 episodes during this encounter. Patient continue to smoke cigarette, drink alcohol. He denies chest pain. No headache.   Allergies  Allergen Reactions  . Lisinopril Anaphylaxis    Swelling of lips and tongue.   Past Medical History  Diagnosis Date  . Chronic back pain   . Hypertension   . Depression   . Small vessel disease     Right basal ganglia stroke  . History of alcohol abuse   . Sciatica neuralgia    Current Outpatient Prescriptions on File Prior to Visit  Medication Sig Dispense Refill  . atenolol (TENORMIN) 50 MG tablet Take 1 tablet (50 mg total) by mouth daily.  90 tablet  2  . baclofen (LIORESAL) 10 MG tablet Take 1 tablet (10 mg total) by mouth 2 (two) times daily.  60 each  1  . gabapentin (NEURONTIN) 300 MG capsule Take 1 capsule (300 mg total) by mouth 3 (three) times daily.  90 capsule  3  . HYDROcodone-acetaminophen (NORCO) 10-325 MG per tablet Take 1 tablet by mouth every 4 (four) hours as needed for pain (after teeth removal).      . traMADol (ULTRAM) 50 MG tablet Take 1 tablet (50 mg total) by mouth every 6 (six) hours as needed for pain.  30 tablet  0  . traZODone (DESYREL) 50 MG tablet Take 2 tablets (100 mg total) by mouth at bedtime.  30 tablet  0   No current facility-administered medications on file prior to visit.   Family History  Problem Relation Age of Onset  . Heart disease Father    History   Social History  . Marital Status: Single    Spouse Name: N/A    Number of Children: 3  . Years of  Education: HS   Occupational History  .      disable   Social History Main Topics  . Smoking status: Current Every Day Smoker -- 0.30 packs/day    Types: Cigarettes    Last Attempt to Quit: 11/29/2012  . Smokeless tobacco: Never Used  . Alcohol Use: Yes     Comment: History of alcohol abuse, marijuana use, cocaine use  . Drug Use: Yes    Special: Marijuana, Cocaine  . Sexual Activity: Yes   Other Topics Concern  . Not on file   Social History Narrative  . No narrative on file    Review of Systems: Constitutional: Negative for fever, chills, diaphoresis, activity change, appetite change and fatigue. HENT: Negative for ear pain, nosebleeds, congestion, facial swelling, rhinorrhea, neck pain, neck stiffness and ear discharge.  Eyes: Negative for pain, discharge, redness, itching and visual disturbance. Respiratory: Negative for cough, choking, chest tightness, shortness of breath, wheezing and stridor.  Cardiovascular: Negative for chest pain, palpitations and leg swelling. Gastrointestinal: Negative for abdominal distention. Genitourinary: Negative for dysuria, urgency, frequency, hematuria, flank pain, decreased urine volume, difficulty urinating and dyspareunia.  Musculoskeletal: Negative for back pain, joint swelling, arthralgias and gait problem. Neurological: Negative for dizziness,  tremors, seizures, syncope, facial asymmetry, speech difficulty, weakness, light-headedness, numbness and headaches.  Hematological: Negative for adenopathy. Does not bruise/bleed easily. Psychiatric/Behavioral: Negative for hallucinations, behavioral problems, confusion, dysphoric mood, decreased concentration and agitation.    Objective:   Filed Vitals:   03/08/13 1223  BP: 113/73  Pulse: 62  Temp: 98.2 F (36.8 C)  Resp: 16    Physical Exam: Constitutional: Patient appears well-developed and well-nourished. No distress. HENT: Some scarification marks of old surgeries, External  right and left ear normal. Oropharynx is clear and moist.  Eyes: Conjunctivae and EOM are normal. PERRLA, no scleral icterus. Neck: Normal ROM. Neck supple. No JVD. No tracheal deviation. No thyromegaly. CVS: RRR, S1/S2 +, no murmurs, no gallops, no carotid bruit.  Pulmonary: Effort and breath sounds normal, no stridor, rhonchi, wheezes, rales.  Abdominal: Soft. BS +,  no distension, tenderness, rebound or guarding.  Musculoskeletal: Normal range of motion. No edema and no tenderness.  Lymphadenopathy: No lymphadenopathy noted, cervical, inguinal or axillary  Skin: Skin is warm and dry. No rash noted. Not diaphoretic. No erythema. No pallor. Psychiatric: Normal mood and affect. Behavior, judgment, thought content normal.  Lab Results  Component Value Date   WBC 4.6 02/15/2013   HGB 13.6 02/15/2013   HCT 42.6 02/15/2013   MCV 75.7* 02/15/2013   PLT 204 02/15/2013   Lab Results  Component Value Date   CREATININE 1.31 02/15/2013   BUN 13 02/15/2013   NA 141 02/15/2013   K 4.2 02/15/2013   CL 106 02/15/2013   CO2 29 02/15/2013    No results found for this basename: HGBA1C   Lipid Panel  No results found for this basename: chol, trig, hdl, cholhdl, vldl, ldlcalc       Assessment and plan:   Patient Active Problem List   Diagnosis Date Noted  . Chronic pain syndrome 03/08/2013  . Chronic low back pain 10/12/2012  . Sciatica neuralgia 10/12/2012  . Hypertension 10/12/2012  . Depression 10/12/2012  . Insomnia 10/12/2012  . Allergic rhinitis 10/12/2012   Prednisone 20 mg tablet by mouth daily for 10 days Patient was extensively counseled about pain He was again told about our policy in this clinic that we will not refill narcotic chronic basis He was informed of this clinic will now have contrast with patient's home needs chronic pain medication and is will include random urine drug tests. He was encouraged to keep the appointment with a neurologist next week.  CASIMIR BARCELLOS was given clear  instructions to go to ER or return to the clinic if symptoms don't improve, worsen or new problems develop.  [name] verbalized understanding.  DAIMIAN SUDBERRY was told to call to get lab results if hasn't heard anything in the next week.        Jeanann Lewandowsky, MD [facility] [place], [place] [phone]   03/08/2013, 12:50 PM

## 2013-03-08 NOTE — Progress Notes (Signed)
Patient here for follow up Back pain Had nerve test done this am

## 2013-03-08 NOTE — Procedures (Signed)
  HISTORY:  Jason Stokes is a 52 year old gentleman with a history of back pain, and bilateral lower extremity pain. The patient reports chronic low back pain since stepping in a hole in 2012. The patient is being evaluated for possible radiculopathy.  NERVE CONDUCTION STUDIES:  Nerve conduction studies were performed on the right upper extremity. The distal motor latency for the right median nerve was prolonged, with a normal motor amplitude. The distal motor latency and motor amplitude for the right ulnar nerve is normal. The F wave latencies and nerve conduction velocities for the right median and ulnar nerves were normal. The right median sensory latency was prolonged, the right ulnar sensory latency was normal.  Nerve conduction studies were performed on both lower extremities. The distal motor latencies and motor amplitudes for the peroneal and posterior tibial nerves were within normal limits. The nerve conduction velocities for these nerves were also normal. The H reflex latencies were normal. The sensory latencies for the peroneal nerves were within normal limits.   EMG STUDIES:  EMG study was performed on the right lower extremity:  The tibialis anterior muscle reveals 2 to 4K motor units with full recruitment. No fibrillations or positive waves were seen. The peroneus tertius muscle reveals 2 to 4K motor units with full recruitment. No fibrillations or positive waves were seen. The medial gastrocnemius muscle reveals 1 to 3K motor units with full recruitment. No fibrillations or positive waves were seen. The vastus lateralis muscle reveals 2 to 4K motor units with full recruitment. No fibrillations or positive waves were seen. The iliopsoas muscle reveals 2 to 4K motor units with full recruitment. No fibrillations or positive waves were seen. The biceps femoris muscle (long head) reveals 2 to 4K motor units with full recruitment. No fibrillations or positive waves were seen. The  lumbosacral paraspinal muscles were tested at 3 levels, and revealed no abnormalities of insertional activity at all 3 levels tested. There was good relaxation.  EMG study was performed on the left lower extremity:  The tibialis anterior muscle reveals 2 to 4K motor units with full recruitment. No fibrillations or positive waves were seen. The peroneus tertius muscle reveals 2 to 4K motor units with full recruitment. No fibrillations or positive waves were seen. The medial gastrocnemius muscle reveals 1 to 3K motor units with full recruitment. No fibrillations or positive waves were seen. The vastus lateralis muscle reveals 2 to 4K motor units with full recruitment. No fibrillations or positive waves were seen. The iliopsoas muscle reveals 2 to 4K motor units with full recruitment. No fibrillations or positive waves were seen. The biceps femoris muscle (long head) reveals 2 to 4K motor units with full recruitment. No fibrillations or positive waves were seen. The lumbosacral paraspinal muscles were tested at 3 levels, and revealed no abnormalities of insertional activity at all 3 levels tested. There was good relaxation.   IMPRESSION:  Nerve conduction studies done on the right upper extremity and on both lower extremities shows evidence of a mild right carpal tunnel syndrome. There is no evidence of an overlying peripheral neuropathy. EMG evaluations of both lower extremities were unremarkable, without evidence of a lumbosacral radiculopathy on either side.  Marlan Palau MD 03/08/2013 5:19 PM  [facility] [address] [place], [place] [address]  Phone [phone] Fax [phone]

## 2013-03-12 ENCOUNTER — Other Ambulatory Visit: Payer: Self-pay | Admitting: Neurology

## 2013-03-12 DIAGNOSIS — M545 Low back pain, unspecified: Secondary | ICD-10-CM

## 2013-03-12 DIAGNOSIS — M79604 Pain in right leg: Secondary | ICD-10-CM

## 2013-03-13 ENCOUNTER — Telehealth: Payer: Self-pay | Admitting: Neurology

## 2013-03-19 ENCOUNTER — Telehealth: Payer: Self-pay | Admitting: Neurology

## 2013-03-19 ENCOUNTER — Ambulatory Visit (HOSPITAL_COMMUNITY)
Admission: RE | Admit: 2013-03-19 | Discharge: 2013-03-19 | Disposition: A | Discharge status: Home / Self Care | Payer: Medicaid Other | Source: Ambulatory Visit | Attending: Neurology | Admitting: Neurology

## 2013-03-19 DIAGNOSIS — M4802 Spinal stenosis, cervical region: Secondary | ICD-10-CM | POA: Insufficient documentation

## 2013-03-19 DIAGNOSIS — R209 Unspecified disturbances of skin sensation: Secondary | ICD-10-CM | POA: Insufficient documentation

## 2013-03-19 DIAGNOSIS — M62838 Other muscle spasm: Secondary | ICD-10-CM | POA: Insufficient documentation

## 2013-03-19 DIAGNOSIS — M5431 Sciatica, right side: Secondary | ICD-10-CM

## 2013-03-19 DIAGNOSIS — M542 Cervicalgia: Secondary | ICD-10-CM | POA: Insufficient documentation

## 2013-03-19 DIAGNOSIS — G8929 Other chronic pain: Secondary | ICD-10-CM

## 2013-03-19 NOTE — Telephone Encounter (Signed)
I called patient. MRI of the cervical spine was relatively unremarkable, shows some degenerative changes, nothing that would require surgery. The patient will be getting an epidural steroid injection of the low back.

## 2013-03-19 NOTE — Telephone Encounter (Signed)
I will rewrite a prescription.

## 2013-03-21 ENCOUNTER — Ambulatory Visit
Admission: RE | Admit: 2013-03-21 | Discharge: 2013-03-21 | Disposition: A | Discharge status: Home / Self Care | Payer: Medicaid Other | Source: Ambulatory Visit | Attending: Neurology | Admitting: Neurology

## 2013-03-21 ENCOUNTER — Ambulatory Visit: Payer: Medicaid Other | Admitting: Physical Medicine & Rehabilitation

## 2013-03-21 ENCOUNTER — Encounter: Payer: Medicaid Other | Attending: Physical Medicine & Rehabilitation

## 2013-03-21 DIAGNOSIS — M24559 Contracture, unspecified hip: Secondary | ICD-10-CM | POA: Insufficient documentation

## 2013-03-21 DIAGNOSIS — M25559 Pain in unspecified hip: Secondary | ICD-10-CM | POA: Insufficient documentation

## 2013-03-21 DIAGNOSIS — M79604 Pain in right leg: Secondary | ICD-10-CM

## 2013-03-21 DIAGNOSIS — R259 Unspecified abnormal involuntary movements: Secondary | ICD-10-CM | POA: Insufficient documentation

## 2013-03-21 DIAGNOSIS — M545 Low back pain: Secondary | ICD-10-CM

## 2013-03-21 MED ORDER — IOHEXOL 180 MG/ML  SOLN
1.0000 mL | Freq: Once | INTRAMUSCULAR | Status: AC | PRN
  Administered 2013-03-21: 1 mL via EPIDURAL

## 2013-03-21 MED ORDER — METHYLPREDNISOLONE ACETATE 40 MG/ML INJ SUSP (RADIOLOG
120.0000 mg | Freq: Once | INTRAMUSCULAR | Status: AC
  Administered 2013-03-21: 120 mg via EPIDURAL

## 2013-04-05 NOTE — Telephone Encounter (Signed)
Pt was seen during office visit on 01/11/13.

## 2013-04-10 ENCOUNTER — Telehealth: Payer: Self-pay | Admitting: Neurology

## 2013-04-10 ENCOUNTER — Telehealth: Payer: Self-pay | Admitting: Radiology

## 2013-04-10 DIAGNOSIS — M545 Low back pain: Secondary | ICD-10-CM

## 2013-04-10 NOTE — Telephone Encounter (Signed)
Pt had injection on Sept. 4, 2014. He is now experiencing tingling in his feet. Asked pt to follow up with Dr. Anne Hahn to see if he wanted another injection or if he wanted to see the pt in the office.

## 2013-04-11 NOTE — Telephone Encounter (Signed)
I called the patient. The patient got the epidural steroid injection, and he got benefit for about one week, but the back pain is once again a problem. I'll try a repeat epidural steroid injection.

## 2013-04-11 NOTE — Telephone Encounter (Signed)
Per note pt still with problems.

## 2013-04-15 ENCOUNTER — Other Ambulatory Visit: Payer: Self-pay | Admitting: Neurology

## 2013-04-15 DIAGNOSIS — M549 Dorsalgia, unspecified: Secondary | ICD-10-CM

## 2013-04-18 ENCOUNTER — Ambulatory Visit
Admission: RE | Admit: 2013-04-18 | Discharge: 2013-04-18 | Disposition: A | Discharge status: Home / Self Care | Payer: Medicaid Other | Source: Ambulatory Visit | Attending: Neurology | Admitting: Neurology

## 2013-04-18 VITALS — BP 113/77 | HR 66

## 2013-04-18 DIAGNOSIS — M549 Dorsalgia, unspecified: Secondary | ICD-10-CM

## 2013-04-18 DIAGNOSIS — G894 Chronic pain syndrome: Secondary | ICD-10-CM

## 2013-04-18 MED ORDER — IOHEXOL 180 MG/ML  SOLN
1.0000 mL | Freq: Once | INTRAMUSCULAR | Status: AC | PRN
  Administered 2013-04-18: 1 mL via EPIDURAL

## 2013-04-18 MED ORDER — METHYLPREDNISOLONE ACETATE 40 MG/ML INJ SUSP (RADIOLOG
120.0000 mg | Freq: Once | INTRAMUSCULAR | Status: AC
  Administered 2013-04-18: 120 mg via EPIDURAL

## 2013-04-22 ENCOUNTER — Other Ambulatory Visit: Payer: Self-pay | Admitting: Neurology

## 2013-04-22 DIAGNOSIS — M545 Low back pain: Secondary | ICD-10-CM

## 2013-05-09 ENCOUNTER — Ambulatory Visit: Payer: Medicaid Other | Attending: Internal Medicine | Admitting: Internal Medicine

## 2013-05-09 VITALS — BP 143/87 | HR 92 | Temp 99.1°F | Resp 17

## 2013-05-09 DIAGNOSIS — Z23 Encounter for immunization: Secondary | ICD-10-CM

## 2013-05-09 DIAGNOSIS — J329 Chronic sinusitis, unspecified: Secondary | ICD-10-CM | POA: Insufficient documentation

## 2013-05-09 DIAGNOSIS — M5430 Sciatica, unspecified side: Secondary | ICD-10-CM

## 2013-05-09 DIAGNOSIS — M5431 Sciatica, right side: Secondary | ICD-10-CM

## 2013-05-09 DIAGNOSIS — I10 Essential (primary) hypertension: Secondary | ICD-10-CM

## 2013-05-09 DIAGNOSIS — G894 Chronic pain syndrome: Secondary | ICD-10-CM

## 2013-05-09 MED ORDER — OXYCODONE-ACETAMINOPHEN 7.5-325 MG PO TABS: 1.0000 | ORAL_TABLET | Freq: Four times a day (QID) | ORAL | Status: DC | PRN

## 2013-05-09 MED ORDER — ATENOLOL 50 MG PO TABS: 50.0000 mg | ORAL_TABLET | Freq: Every day | ORAL | Status: DC

## 2013-05-09 MED ORDER — AMOXICILLIN 500 MG PO CAPS: 500.0000 mg | ORAL_CAPSULE | Freq: Two times a day (BID) | ORAL | Status: DC

## 2013-05-09 NOTE — Patient Instructions (Signed)

## 2013-05-09 NOTE — Progress Notes (Signed)
Patient complains of nasal congestion and sinus problem Running a low grade temp

## 2013-05-09 NOTE — Progress Notes (Signed)
Patient ID: Jason Stokes, male   DOB: 09/14/1960, 52 y.o.   MRN: [id number]  CC: follow up  HPI: 52 year old male with past medical history of hypertension, chronic back pain who presented to clinic for follow up. Pt reported sinusitis with occasional fevers at home and green nasal discharge. He reports no nausea or vomiting. No headaches. No chest pain or shortness of breath. No cough.  Allergies  Allergen Reactions  . Lisinopril Anaphylaxis    Swelling of lips and tongue.   Past Medical History  Diagnosis Date  . Chronic back pain   . Hypertension   . Depression   . Small vessel disease     Right basal ganglia stroke  . History of alcohol abuse   . Sciatica neuralgia    Current Outpatient Prescriptions on File Prior to Visit  Medication Sig Dispense Refill  . gabapentin (NEURONTIN) 300 MG capsule Take 1 capsule (300 mg total) by mouth 3 (three) times daily.  90 capsule  3   No current facility-administered medications on file prior to visit.   Family History  Problem Relation Age of Onset  . Heart disease Father    History   Social History  . Marital Status: Single    Spouse Name: N/A    Number of Children: 3  . Years of Education: HS   Occupational History  .      disable   Social History Main Topics  . Smoking status: Current Every Day Smoker -- 0.30 packs/day    Types: Cigarettes    Last Attempt to Quit: 11/29/2012  . Smokeless tobacco: Never Used  . Alcohol Use: Yes     Comment: History of alcohol abuse, marijuana use, cocaine use  . Drug Use: Yes    Special: Marijuana, Cocaine  . Sexual Activity: Yes   Other Topics Concern  . Not on file   Social History Narrative  . No narrative on file    Review of Systems  Constitutional: Negative for fever, chills, diaphoresis, activity change, appetite change and fatigue.  HENT: Negative for ear pain, nosebleeds, positive for congestion, rhinorrhea, no neck pain, neck stiffness or ear discharge.   Eyes:  Negative for pain, discharge, redness, itching and visual disturbance.  Respiratory: Negative for cough, choking, chest tightness, shortness of breath, wheezing and stridor.   Cardiovascular: Negative for chest pain, palpitations and leg swelling.  Gastrointestinal: Negative for abdominal distention.  Genitourinary: Negative for dysuria, urgency, frequency, hematuria, flank pain, decreased urine volume, difficulty urinating and dyspareunia.  Musculoskeletal: Negative for back pain, joint swelling, arthralgias and gait problem.  Neurological: Negative for dizziness, tremors, seizures, syncope, facial asymmetry, speech difficulty, weakness, light-headedness, numbness and headaches.  Hematological: Negative for adenopathy. Does not bruise/bleed easily.  Psychiatric/Behavioral: Negative for hallucinations, behavioral problems, confusion, dysphoric mood, decreased concentration and agitation.    Objective:   Filed Vitals:   05/09/13 1440  BP: 143/87  Pulse: 92  Temp: 99.1 F (37.3 C)  Resp: 17    Physical Exam  Constitutional: Appears well-developed and well-nourished. No distress.  HENT: Normocephalic. External right and left ear normal. Oropharynx is clear and moist.  Eyes: Conjunctivae and EOM are normal. PERRLA, no scleral icterus.  Neck: Normal ROM. Neck supple. No JVD. No tracheal deviation. No thyromegaly.  CVS: RRR, S1/S2 +, no murmurs, no gallops, no carotid bruit.  Pulmonary: Effort and breath sounds normal, no stridor, rhonchi, wheezes, rales.  Abdominal: Soft. BS +,  no distension, tenderness, rebound or guarding.  Musculoskeletal: Normal range of motion. No edema and no tenderness.  Lymphadenopathy: No lymphadenopathy noted, cervical, inguinal. Neuro: Alert. Normal reflexes, muscle tone coordination. No cranial nerve deficit. Skin: Skin is warm and dry. No rash noted. Not diaphoretic. No erythema. No pallor.  Psychiatric: Normal mood and affect. Behavior, judgment, thought  content normal.   Lab Results  Component Value Date   WBC 4.6 02/15/2013   HGB 13.6 02/15/2013   HCT 42.6 02/15/2013   MCV 75.7* 02/15/2013   PLT 204 02/15/2013   Lab Results  Component Value Date   CREATININE 1.31 02/15/2013   BUN 13 02/15/2013   NA 141 02/15/2013   K 4.2 02/15/2013   CL 106 02/15/2013   CO2 29 02/15/2013    No results found for this basename: HGBA1C   Lipid Panel  No results found for this basename: chol, trig, hdl, cholhdl, vldl, ldlcalc       Assessment and plan:   Patient Active Problem List   Diagnosis Date Noted  . Sinusitis, chronic 05/09/2013    Priority: Medium - will give 7 days of amoxicillin  . Chronic pain syndrome 03/08/2013    Priority: Medium - referral to pain management clinic provided   . Hypertension 10/12/2012    Priority: Medium - prescription for atenolol provided - We have discussed target BP range - I have advised pt to check BP regularly and to call us back if the numbers are higher than 140/90 - discussed the importance of compliance with medical therapy and diet

## 2013-06-10 ENCOUNTER — Ambulatory Visit: Payer: Medicaid Other | Admitting: Internal Medicine

## 2013-07-03 ENCOUNTER — Telehealth: Payer: Self-pay | Admitting: Neurology

## 2013-07-03 NOTE — Telephone Encounter (Signed)
Patient called wanting to schedule an appointment with Dr. Anne Hahn. Patient states he has had 2 nerve injections and is experiencing sharp pains on his right thigh that extends down to his feet. Patient also is experiencing sharp pains on his right pinky finger. Please call the patient.

## 2013-07-05 ENCOUNTER — Encounter: Payer: Self-pay | Admitting: Neurology

## 2013-07-05 ENCOUNTER — Ambulatory Visit (INDEPENDENT_AMBULATORY_CARE_PROVIDER_SITE_OTHER): Payer: Medicaid Other | Admitting: Neurology

## 2013-07-05 VITALS — BP 154/97 | HR 72 | Wt 259.0 lb

## 2013-07-05 DIAGNOSIS — M5431 Sciatica, right side: Secondary | ICD-10-CM

## 2013-07-05 DIAGNOSIS — G894 Chronic pain syndrome: Secondary | ICD-10-CM

## 2013-07-05 DIAGNOSIS — M5430 Sciatica, unspecified side: Secondary | ICD-10-CM

## 2013-07-05 MED ORDER — CARBAMAZEPINE 200 MG PO TABS: ORAL_TABLET | ORAL | Status: DC

## 2013-07-05 NOTE — Progress Notes (Signed)
Reason for visit: Back pain  Jason Stokes is an 52 y.o. male  History of present illness:  Jason Stokes is a 52 year old right-handed black male with a history of low back pain and predominantly right leg pain. 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 EMG and nerve conduction study evaluation was done on the right leg previously were unremarkable. The patient has been placed on gabapentin, and he has been set up for epidural steroid injections. The patient gained only transient benefit with the epidural steroid injections. The patient has noted over the last 2 or 3 weeks that his pain level has increased. The patient is now reporting onset of sharp lancinating pains going from the right ulnar aspect of the hand, upper arm to the shoulder, down the body into the right leg and into the left leg. The episodes of pain last 2-3 minutes, then go away. The patient may have 2 or 3 episodes a day. 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 The patient reports that he will fall on occasion. The patient uses a cane for ambulation. The patient returns for further evaluation.  Past Medical History  Diagnosis Date  . Chronic back pain   . Hypertension   . Depression   . Small vessel disease     Right basal ganglia stroke  . History of alcohol abuse   . Sciatica neuralgia     Past Surgical History  Procedure Laterality Date  . Intercostal nerve block  2005  . Surgery back of head  1996-97    Family History  Problem Relation Age of Onset  . Heart disease Father     Social history:  reports that he has been smoking Cigarettes.  He has been smoking about 0.30 packs per day. He has never used smokeless tobacco. He reports that he uses illicit drugs (Marijuana and Cocaine). He reports that he does not  drink alcohol.    Allergies  Allergen Reactions  . Lisinopril Anaphylaxis    Swelling of lips and tongue.    Medications:  Current Outpatient Prescriptions on File Prior to Visit  Medication Sig Dispense Refill  . atenolol (TENORMIN) 50 MG tablet Take 1 tablet (50 mg total) by mouth daily.  30 tablet  3  . gabapentin (NEURONTIN) 300 MG capsule Take 1 capsule (300 mg total) by mouth 3 (three) times daily.  90 capsule  3  . oxyCODONE-acetaminophen (PERCOCET) 7.5-325 MG per tablet Take 1 tablet by mouth every 6 (six) hours as needed for pain.  60 tablet  0   No current facility-administered medications on file prior to visit.    ROS:  Out of a complete 14 system review of symptoms, the patient complains only of the following symptoms, and all other reviewed systems are negative.  Runny nose, neck pain Light sensitivity, eye pain Insomnia Joint pain, back pain, muscle cramps, walking difficulties  Blood pressure 154/97, pulse 72, weight 259 lb (117.482 kg).  Physical Exam  General: The patient is alert and cooperative at the time of the examination.  Skin: No significant peripheral edema is noted.   Neurologic Exam  Mental status: The patient is oriented x 3.  Cranial nerves: Facial symmetry is present. Speech is normal, no aphasia or dysarthria is noted. Extraocular movements are full. Visual  fields are full.  Motor: The patient has good strength in all 4 extremities.  Sensory examination: The patient has decreased soft sensation on the right arm and leg as compared to the left, and decreased on the right face as compared to the left. The patient splits the midline with vibration sensation on the forehead, decreased on the right. Vibration sensation is decreased on the right hand as compared to the left.  Coordination: The patient has good finger-nose-finger and heel-to-shin bilaterally. The patient has intermittent episodes of extensor "tremors" of the right leg.  Gait  and station: The patient has a limping gait on the right leg. The patient uses a cane for ambulation. Tandem gait is normal. Romberg is negative. No drift is seen.  Reflexes: Deep tendon reflexes are symmetric.   MRI cervical spine 03/19/2013:  IMPRESSION:  1. Moderate left foraminal stenosis at C3-4 and C4-5 due to  uncovertebral and facet disease at these levels.  2. Mild right foraminal stenosis at C3-4 and C4-5.  3. Mild left central canal narrowing at C3-4.  4. Mild to moderate to foraminal stenosis bilaterally at C5-6.  Assessment/Plan:  1. 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 Factitious tremor, right greater than left lower extremity  3. Nonorganic neurologic examination.  The patient does have MRI findings that could lead to some low back pain, but the patient has multiple aspects of his clinical examination that are likely nonorganic. The patient has unusual extension tremors of the legs, right greater than left that are likely factitious. MRI of the cervical spine has been done and is relatively unremarkable. The patient also has a nonorganic sensory examination, splitting the midline with vibration sensation on the forehead. Much of the complaints offered by this patient likely are factitious. The patient will be placed on low-dose carbamazepine taking 100 mg twice daily for 2 weeks, then go to 200 mg twice daily. The patient will be set up for a physical therapy evaluation for a TENS unit trial. The patient will followup in 6 months. The patient will continue the gabapentin.  Marlan Palau MD 07/06/2013 9:14 AM  [facility] [address] [place], [place] [address]  Phone [phone] Fax [phone]

## 2013-07-05 NOTE — Patient Instructions (Signed)
Back Pain, Adult Low back pain is very common. About 1 in 5 people have back pain.The cause of low back pain is rarely dangerous. The pain often gets better over time.About half of people with a sudden onset of back pain feel better in just 2 weeks. About 8 in 10 people feel better by 6 weeks.  CAUSES Some common causes of back pain include:  Strain of the muscles or ligaments supporting the spine.  Wear and tear (degeneration) of the spinal discs.  Arthritis.  Direct injury to the back. DIAGNOSIS Most of the time, the direct cause of low back pain is not known.However, back pain can be treated effectively even when the exact cause of the pain is unknown.Answering your caregiver's questions about your overall health and symptoms is one of the most accurate ways to make sure the cause of your pain is not dangerous. If your caregiver needs more information, he or she may order lab work or imaging tests (X-rays or MRIs).However, even if imaging tests show changes in your back, this usually does not require surgery. HOME CARE INSTRUCTIONS For many people, back pain returns.Since low back pain is rarely dangerous, it is often a condition that people can learn to manageon their own.   Remain active. It is stressful on the back to sit or stand in one place. Do not sit, drive, or stand in one place for more than 30 minutes at a time. Take short walks on level surfaces as soon as pain allows.Try to increase the length of time you walk each day.  Do not stay in bed.Resting more than 1 or 2 days can delay your recovery.  Do not avoid exercise or work.Your body is made to move.It is not dangerous to be active, even though your back may hurt.Your back will likely heal faster if you return to being active before your pain is gone.  Pay attention to your body when you bend and lift. Many people have less discomfortwhen lifting if they bend their knees, keep the load close to their bodies,and  avoid twisting. Often, the most comfortable positions are those that put less stress on your recovering back.  Find a comfortable position to sleep. Use a firm mattress and lie on your side with your knees slightly bent. If you lie on your back, put a pillow under your knees.  Only take over-the-counter or prescription medicines as directed by your caregiver. Over-the-counter medicines to reduce pain and inflammation are often the most helpful.Your caregiver may prescribe muscle relaxant drugs.These medicines help dull your pain so you can more quickly return to your normal activities and healthy exercise.  Put ice on the injured area.  Put ice in a plastic bag.  Place a towel between your skin and the bag.  Leave the ice on for 15-20 minutes, 03-04 times a day for the first 2 to 3 days. After that, ice and heat may be alternated to reduce pain and spasms.  Ask your caregiver about trying back exercises and gentle massage. This may be of some benefit.  Avoid feeling anxious or stressed.Stress increases muscle tension and can worsen back pain.It is important to recognize when you are anxious or stressed and learn ways to manage it.Exercise is a great option. SEEK MEDICAL CARE IF:  You have pain that is not relieved with rest or medicine.  You have pain that does not improve in 1 week.  You have new symptoms.  You are generally not feeling well. SEEK   IMMEDIATE MEDICAL CARE IF:   You have pain that radiates from your back into your legs.  You develop new bowel or bladder control problems.  You have unusual weakness or numbness in your arms or legs.  You develop nausea or vomiting.  You develop abdominal pain.  You feel faint. Document Released: 07/04/2005 Document Revised: 01/03/2012 Document Reviewed: 11/22/2010 [employer] Patient Information 2014 [employer].  

## 2013-07-25 ENCOUNTER — Ambulatory Visit: Payer: Medicaid Other | Attending: Neurology

## 2013-07-25 DIAGNOSIS — M25519 Pain in unspecified shoulder: Secondary | ICD-10-CM | POA: Insufficient documentation

## 2013-07-25 DIAGNOSIS — IMO0001 Reserved for inherently not codable concepts without codable children: Secondary | ICD-10-CM | POA: Insufficient documentation

## 2013-07-25 DIAGNOSIS — G894 Chronic pain syndrome: Secondary | ICD-10-CM | POA: Insufficient documentation

## 2013-08-14 ENCOUNTER — Ambulatory Visit: Payer: Medicaid Other | Attending: Internal Medicine | Admitting: Internal Medicine

## 2013-08-14 ENCOUNTER — Encounter: Payer: Self-pay | Admitting: Internal Medicine

## 2013-08-14 VITALS — BP 144/94 | HR 70 | Temp 98.7°F | Resp 15 | Wt 250.0 lb

## 2013-08-14 DIAGNOSIS — R059 Cough, unspecified: Secondary | ICD-10-CM

## 2013-08-14 DIAGNOSIS — J329 Chronic sinusitis, unspecified: Secondary | ICD-10-CM | POA: Insufficient documentation

## 2013-08-14 DIAGNOSIS — G8929 Other chronic pain: Secondary | ICD-10-CM | POA: Insufficient documentation

## 2013-08-14 DIAGNOSIS — M545 Low back pain, unspecified: Secondary | ICD-10-CM

## 2013-08-14 DIAGNOSIS — R05 Cough: Secondary | ICD-10-CM

## 2013-08-14 MED ORDER — OXYCODONE-ACETAMINOPHEN 7.5-325 MG PO TABS: 1.0000 | ORAL_TABLET | Freq: Four times a day (QID) | ORAL | Status: DC | PRN

## 2013-08-14 MED ORDER — BENZONATATE 100 MG PO CAPS: 100.0000 mg | ORAL_CAPSULE | Freq: Three times a day (TID) | ORAL | Status: DC | PRN

## 2013-08-14 MED ORDER — AMOXICILLIN-POT CLAVULANATE 875-125 MG PO TABS: 1.0000 | ORAL_TABLET | Freq: Two times a day (BID) | ORAL | Status: DC

## 2013-08-14 NOTE — Progress Notes (Signed)
Patient ID: Jason Stokes, male   DOB: [birth year], 53 y.o.   MRN: [id number] MRN: [id number] Name: Jason Stokes  Sex: male Age: 53 y.o. DOB: [birth year]  Allergies: Lisinopril  Chief Complaint  Patient presents with  . Sinusitis    HPI: Patient is 53 y.o. male who comes today reported to have lot of sinus congestion postnasal drip stuffy nose runny nose productive cough for the last 2-3 weeks, he has tried over-the-counter medication without much improvement occasionally smokes cigarettes denies any chest pain or shortness of breath, also patient is following up with neurology for she had neuralgia, patient is requesting Percocet which he was using when necessary, have counseled patient and advised that for narcotic medication he gets to see a pain management, as per patient once in the past he saw the pain management.  Past Medical History  Diagnosis Date  . Chronic back pain   . Hypertension   . Depression   . Small vessel disease     Right basal ganglia stroke  . History of alcohol abuse   . Sciatica neuralgia     Past Surgical History  Procedure Laterality Date  . Intercostal nerve block  2005  . Surgery back of head  1996-97      Medication List       This list is accurate as of: 08/14/13  4:05 PM.  Always use your most recent med list.               amoxicillin-clavulanate 875-125 MG per tablet  Commonly known as:  AUGMENTIN  Take 1 tablet by mouth 2 (two) times daily.     atenolol 50 MG tablet  Commonly known as:  TENORMIN  Take 1 tablet (50 mg total) by mouth daily.     benzonatate 100 MG capsule  Commonly known as:  TESSALON  Take 1 capsule (100 mg total) by mouth 3 (three) times daily as needed for cough.     carbamazepine 200 MG tablet  Commonly known as:  TEGRETOL  One half tablet twice a day for 2 weeks, then take one tablet twice a day     gabapentin 300 MG capsule  Commonly known as:  NEURONTIN  Take 1 capsule (300 mg total) by mouth 3 (three)  times daily.     oxyCODONE-acetaminophen 7.5-325 MG per tablet  Commonly known as:  PERCOCET  Take 1 tablet by mouth every 6 (six) hours as needed for pain.        Meds ordered this encounter  Medications  . amoxicillin-clavulanate (AUGMENTIN) 875-125 MG per tablet    Sig: Take 1 tablet by mouth 2 (two) times daily.    Dispense:  20 tablet    Refill:  0  . oxyCODONE-acetaminophen (PERCOCET) 7.5-325 MG per tablet    Sig: Take 1 tablet by mouth every 6 (six) hours as needed for pain.    Dispense:  60 tablet    Refill:  0  . benzonatate (TESSALON) 100 MG capsule    Sig: Take 1 capsule (100 mg total) by mouth 3 (three) times daily as needed for cough.    Dispense:  30 capsule    Refill:  1    Immunization History  Administered Date(s) Administered  . Influenza Split 05/09/2013    Family History  Problem Relation Age of Onset  . Heart disease Father     History  Substance Use Topics  . Smoking status: Current Every Day Smoker -- 0.30 packs/day  Types: Cigarettes    Last Attempt to Quit: 11/29/2012  . Smokeless tobacco: Never Used  . Alcohol Use: No     Comment: History of alcohol abuse, marijuana use, cocaine use    Review of Systems  As noted in HPI  Filed Vitals:   08/14/13 1542  BP: 144/94  Pulse: 70  Temp: 98.7 F (37.1 C)  Resp: 15    Physical Exam  Physical Exam  HENT:  Nasal congestion and sinus tenderness minimal pharyngeal erythema no exudate  Eyes: EOM are normal. Pupils are equal, round, and reactive to light.  Cardiovascular: Normal rate and regular rhythm.   Pulmonary/Chest: No respiratory distress. He has no wheezes. He has no rales.  Musculoskeletal:  Low lumbar spinal and paraspinal tenderness  Lymphadenopathy:    He has no cervical adenopathy.    CBC    Component Value Date/Time   WBC 4.6 02/15/2013 1243   RBC 5.63 02/15/2013 1243   HGB 13.6 02/15/2013 1243   HCT 42.6 02/15/2013 1243   PLT 204 02/15/2013 1243   MCV 75.7* 02/15/2013  1243   LYMPHSABS 1.8 02/15/2013 1243   MONOABS 0.4 02/15/2013 1243   EOSABS 0.2 02/15/2013 1243   BASOSABS 0.0 02/15/2013 1243    CMP     Component Value Date/Time   NA 141 02/15/2013 1243   K 4.2 02/15/2013 1243   CL 106 02/15/2013 1243   CO2 29 02/15/2013 1243   GLUCOSE 89 02/15/2013 1243   BUN 13 02/15/2013 1243   CREATININE 1.31 02/15/2013 1243   CREATININE 1.27 03/01/2010 0320   CALCIUM 9.4 02/15/2013 1243   PROT 7.2 02/15/2013 1243   ALBUMIN 4.2 02/15/2013 1243   AST 17 02/15/2013 1243   ALT 16 02/15/2013 1243   ALKPHOS 62 02/15/2013 1243   BILITOT 0.5 02/15/2013 1243   GFRNONAA >60 03/01/2010 0320   GFRAA  Value: >60        The eGFR has been calculated using the MDRD equation. This calculation has not been validated in all clinical situations. eGFR's persistently <60 mL/min signify possible Chronic Kidney Disease. 03/01/2010 0320    No results found for this basename: chol, tri, ldl    No components found with this basename: hga1c    Lab Results  Component Value Date/Time   AST 17 02/15/2013 12:43 PM    Assessment and Plan  Sinusitis - Plan: amoxicillin-clavulanate (AUGMENTIN) 875-125 MG per tablet Salt water gargles. Advised patient to quit smoking.  Chronic low back pain - Plan:Patient was given prescription for oxyCODONE-acetaminophen (PERCOCET) 7.5-325 MG per tablet 100 is seen by pain management, referral done. Ambulatory referral to Pain Clinic  Cough - Plan: benzonatate (TESSALON) 100 MG capsule   Return if symptoms worsen or fail to improve.  Lorayne Marek, MD

## 2013-08-14 NOTE — Progress Notes (Signed)
Patient here for back pain Complains of congestion and sinus pressure for almost 3 weeks

## 2013-10-04 ENCOUNTER — Ambulatory Visit: Payer: Medicaid Other | Attending: Internal Medicine | Admitting: Internal Medicine

## 2013-10-04 VITALS — BP 160/100 | HR 89 | Temp 97.9°F | Ht 71.0 in | Wt 263.8 lb

## 2013-10-04 DIAGNOSIS — R109 Unspecified abdominal pain: Secondary | ICD-10-CM | POA: Insufficient documentation

## 2013-10-04 DIAGNOSIS — M5126 Other intervertebral disc displacement, lumbar region: Secondary | ICD-10-CM | POA: Insufficient documentation

## 2013-10-04 DIAGNOSIS — I10 Essential (primary) hypertension: Secondary | ICD-10-CM | POA: Insufficient documentation

## 2013-10-04 DIAGNOSIS — F3289 Other specified depressive episodes: Secondary | ICD-10-CM | POA: Insufficient documentation

## 2013-10-04 DIAGNOSIS — M5430 Sciatica, unspecified side: Secondary | ICD-10-CM | POA: Insufficient documentation

## 2013-10-04 DIAGNOSIS — Z79899 Other long term (current) drug therapy: Secondary | ICD-10-CM | POA: Insufficient documentation

## 2013-10-04 DIAGNOSIS — F329 Major depressive disorder, single episode, unspecified: Secondary | ICD-10-CM | POA: Insufficient documentation

## 2013-10-04 DIAGNOSIS — M545 Low back pain, unspecified: Secondary | ICD-10-CM

## 2013-10-04 DIAGNOSIS — M549 Dorsalgia, unspecified: Secondary | ICD-10-CM | POA: Insufficient documentation

## 2013-10-04 DIAGNOSIS — I6789 Other cerebrovascular disease: Secondary | ICD-10-CM | POA: Insufficient documentation

## 2013-10-04 DIAGNOSIS — F17200 Nicotine dependence, unspecified, uncomplicated: Secondary | ICD-10-CM | POA: Insufficient documentation

## 2013-10-04 DIAGNOSIS — G8929 Other chronic pain: Secondary | ICD-10-CM | POA: Insufficient documentation

## 2013-10-04 LAB — POCT URINALYSIS DIPSTICK
Bilirubin, UA: NEGATIVE
Glucose, UA: NEGATIVE
Ketones, UA: NEGATIVE
Leukocytes, UA: NEGATIVE
Nitrite, UA: NEGATIVE
Protein, UA: NEGATIVE
Spec Grav, UA: 1.015
Urobilinogen, UA: 0.2
pH, UA: 6.5

## 2013-10-04 LAB — CBC WITH DIFFERENTIAL/PLATELET
Basophils Absolute: 0 10*3/uL (ref 0.0–0.1)
Basophils Relative: 0 % (ref 0–1)
Eosinophils Absolute: 0.1 10*3/uL (ref 0.0–0.7)
Eosinophils Relative: 2 % (ref 0–5)
HCT: 43 % (ref 39.0–52.0)
Hemoglobin: 13.8 g/dL (ref 13.0–17.0)
Lymphocytes Relative: 29 % (ref 12–46)
Lymphs Abs: 1.3 10*3/uL (ref 0.7–4.0)
MCH: 24.5 pg — ABNORMAL LOW (ref 26.0–34.0)
MCHC: 32.1 g/dL (ref 30.0–36.0)
MCV: 76.2 fL — ABNORMAL LOW (ref 78.0–100.0)
Monocytes Absolute: 0.4 10*3/uL (ref 0.1–1.0)
Monocytes Relative: 8 % (ref 3–12)
Neutro Abs: 2.8 10*3/uL (ref 1.7–7.7)
Neutrophils Relative %: 61 % (ref 43–77)
Platelets: 182 10*3/uL (ref 150–400)
RBC: 5.64 MIL/uL (ref 4.22–5.81)
RDW: 17.5 % — ABNORMAL HIGH (ref 11.5–15.5)
WBC: 4.6 10*3/uL (ref 4.0–10.5)

## 2013-10-04 LAB — COMPLETE METABOLIC PANEL WITH GFR
ALT: 21 U/L (ref 0–53)
AST: 15 U/L (ref 0–37)
Albumin: 4.2 g/dL (ref 3.5–5.2)
Alkaline Phosphatase: 62 U/L (ref 39–117)
BUN: 14 mg/dL (ref 6–23)
CO2: 26 mEq/L (ref 19–32)
Calcium: 9.5 mg/dL (ref 8.4–10.5)
Chloride: 104 mEq/L (ref 96–112)
Creat: 1.26 mg/dL (ref 0.50–1.35)
GFR, Est African American: 75 mL/min
GFR, Est Non African American: 65 mL/min
Glucose, Bld: 125 mg/dL — ABNORMAL HIGH (ref 70–99)
Potassium: 3.9 mEq/L (ref 3.5–5.3)
Sodium: 138 mEq/L (ref 135–145)
Total Bilirubin: 0.4 mg/dL (ref 0.2–1.2)
Total Protein: 7.5 g/dL (ref 6.0–8.3)

## 2013-10-04 LAB — CK: Total CK: 158 U/L (ref 7–232)

## 2013-10-04 MED ORDER — CYCLOBENZAPRINE HCL 10 MG PO TABS: 10.0000 mg | ORAL_TABLET | Freq: Three times a day (TID) | ORAL | Status: DC | PRN

## 2013-10-04 MED ORDER — GABAPENTIN 400 MG PO CAPS: 400.0000 mg | ORAL_CAPSULE | Freq: Three times a day (TID) | ORAL | Status: DC

## 2013-10-04 NOTE — Progress Notes (Signed)
Patient is here today complaining of lower back/ flank pain

## 2013-10-04 NOTE — Progress Notes (Signed)
Patient ID: Jason Stokes, male   DOB: May 04, 1961, 53 y.o.   MRN: [id number]   CC:  HPI: 53 year old male with a history of chronic back pain. The patient had nerve conduction studies on 8/14 that showed mild right carpal tunnel syndrome, no evidence of a lumbosacral radiculopathy on either side.  MRI of the C-spine on 8/14 showed Moderate left foraminal stenosis at C3-4 and C4-5 due to  uncovertebral and facet disease at these levels.  2. Mild right foraminal stenosis at C3-4 and C4-5.  3. Mild left central canal narrowing at C3-4.  4. Mild to moderate to foraminal stenosis bilaterally at C5 MRI of the lumbar spine showed disc protrusion L4-L5  Patient is status post epidural steroid injections for his back. He comes in today because he feels that his back pain actually to the right of the lumbar spine as the cause of his kidneys as a side effect of the Abilify  Patient does have tremors in his right lower extremity and then the lumbar spine brace and is being followed by neurology and sports medicine      Allergies  Allergen Reactions  . Lisinopril Anaphylaxis    Swelling of lips and tongue.   Past Medical History  Diagnosis Date  . Chronic back pain   . Hypertension   . Depression   . Small vessel disease     Right basal ganglia stroke  . History of alcohol abuse   . Sciatica neuralgia    Current Outpatient Prescriptions on File Prior to Visit  Medication Sig Dispense Refill  . atenolol (TENORMIN) 50 MG tablet Take 1 tablet (50 mg total) by mouth daily.  30 tablet  3  . carbamazepine (TEGRETOL) 200 MG tablet One half tablet twice a day for 2 weeks, then take one tablet twice a day  60 tablet  3  . oxyCODONE-acetaminophen (PERCOCET) 7.5-325 MG per tablet Take 1 tablet by mouth every 6 (six) hours as needed for pain.  60 tablet  0  . amoxicillin-clavulanate (AUGMENTIN) 875-125 MG per tablet Take 1 tablet by mouth 2 (two) times daily.  20 tablet  0  . benzonatate (TESSALON)  100 MG capsule Take 1 capsule (100 mg total) by mouth 3 (three) times daily as needed for cough.  30 capsule  1   No current facility-administered medications on file prior to visit.   Family History  Problem Relation Age of Onset  . Heart disease Father    History   Social History  . Marital Status: Single    Spouse Name: N/A    Number of Children: 3  . Years of Education: HS   Occupational History  .      disable   Social History Main Topics  . Smoking status: Current Every Day Smoker -- 0.30 packs/day    Types: Cigarettes    Last Attempt to Quit: 11/29/2012  . Smokeless tobacco: Never Used  . Alcohol Use: No     Comment: History of alcohol abuse, marijuana use, cocaine use  . Drug Use: Yes    Special: Marijuana, Cocaine  . Sexual Activity: Yes   Other Topics Concern  . Not on file   Social History Narrative  . No narrative on file    Review of Systems  Constitutional: Negative for fever, chills, diaphoresis, activity change, appetite change and fatigue.  HENT: Negative for ear pain, nosebleeds, congestion, facial swelling, rhinorrhea, neck pain, neck stiffness and ear discharge.   Eyes: Negative for pain,  discharge, redness, itching and visual disturbance.  Respiratory: Negative for cough, choking, chest tightness, shortness of breath, wheezing and stridor.   Cardiovascular: Negative for chest pain, palpitations and leg swelling.  Gastrointestinal: Negative for abdominal distention.  Genitourinary: Negative for dysuria, urgency, frequency, hematuria, flank pain, decreased urine volume, difficulty urinating and dyspareunia.  Musculoskeletal: Negative for back pain, joint swelling, arthralgias and gait problem.  Neurological: Negative for dizziness, tremors, seizures, syncope, facial asymmetry, speech difficulty, weakness, light-headedness, numbness and headaches.  Hematological: Negative for adenopathy. Does not bruise/bleed easily.  Psychiatric/Behavioral: Negative  for hallucinations, behavioral problems, confusion, dysphoric mood, decreased concentration and agitation.    Objective:   Filed Vitals:   10/04/13 1023  BP: 160/100  Pulse: 89  Temp: 97.9 F (36.6 C)    Physical Exam  Constitutional: Appears well-developed and well-nourished. No distress.  HENT: Normocephalic. External right and left ear normal. Oropharynx is clear and moist.  Eyes: Conjunctivae and EOM are normal. PERRLA, no scleral icterus.  Neck: Normal ROM. Neck supple. No JVD. No tracheal deviation. No thyromegaly.  CVS: RRR, S1/S2 +, no murmurs, no gallops, no carotid bruit.  Pulmonary: Effort and breath sounds normal, no stridor, rhonchi, wheezes, rales.  Abdominal: Soft. BS +,  no distension, tenderness, rebound or guarding.  Musculoskeletal: Normal range of motion. No edema and no tenderness.  Lymphadenopathy: No lymphadenopathy noted, cervical, inguinal. Neuro: Alert. Normal reflexes, muscle tone coordination. No cranial nerve deficit. Skin: Skin is warm and dry. No rash noted. Not diaphoretic. No erythema. No pallor.  Psychiatric: Normal mood and affect. Behavior, judgment, thought content normal.   Lab Results  Component Value Date   WBC 4.6 02/15/2013   HGB 13.6 02/15/2013   HCT 42.6 02/15/2013   MCV 75.7* 02/15/2013   PLT 204 02/15/2013   Lab Results  Component Value Date   CREATININE 1.31 02/15/2013   BUN 13 02/15/2013   NA 141 02/15/2013   K 4.2 02/15/2013   CL 106 02/15/2013   CO2 29 02/15/2013    No results found for this basename: HGBA1C   Lipid Panel  No results found for this basename: chol, trig, hdl, cholhdl, vldl, ldlcalc       Assessment and plan:   Patient Active Problem List   Diagnosis Date Noted  . Sinusitis, chronic 05/09/2013  . Chronic pain syndrome 03/08/2013  . Chronic low back pain 10/12/2012  . Sciatica neuralgia 10/12/2012  . Hypertension 10/12/2012  . Depression 10/12/2012  . Insomnia 10/12/2012  . 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 Patient is requesting Percocet Notify the patient that he needs to establish with the pain clinic He was apparently discharged from the pain clinic because of cocaine and marijuana on his urine drug screen Increase gabapentin to 400 mg 3 times a day Start the patient on Flexeril   Flank pain Most likely secondary to disc protrusion Doubt that this is related to his kidneys on Abilify UA is negative And check a CK as a side effect of Abilify and this can cause rhabdomyolysis as well as kidney function  Patient followup in 3 months      The patient was given clear instructions to go to ER or return to medical center if symptoms don't improve, worsen or new problems develop. The patient verbalized understanding. The patient was told to call to get any lab results if not heard anything in the next week.

## 2013-10-14 ENCOUNTER — Telehealth: Payer: Self-pay | Admitting: Emergency Medicine

## 2013-10-14 NOTE — Telephone Encounter (Signed)
Message copied by Ricci Barker on Mon Oct 14, 2013  5:24 PM ------      Message from: Allyson Sabal MD, Ascencion Dike      Created: Wed Oct 09, 2013  4:07 PM       Notify patient of the labs are normal ------

## 2013-10-14 NOTE — Telephone Encounter (Signed)
Pt given lab results. States he was told cholesterol level was elevated at [facility] Scheduled for 10/17/13 @ 10 am

## 2013-10-17 ENCOUNTER — Other Ambulatory Visit: Payer: Medicaid Other

## 2013-10-17 ENCOUNTER — Ambulatory Visit: Payer: Medicaid Other | Attending: Internal Medicine

## 2013-10-17 DIAGNOSIS — R7989 Other specified abnormal findings of blood chemistry: Secondary | ICD-10-CM

## 2013-10-17 LAB — LIPID PANEL
Cholesterol: 174 mg/dL (ref 0–200)
HDL: 32 mg/dL — ABNORMAL LOW (ref >39)
LDL Cholesterol: 106 mg/dL — ABNORMAL HIGH (ref 0–99)
Total CHOL/HDL Ratio: 5.4 Ratio
Triglycerides: 180 mg/dL — ABNORMAL HIGH (ref <150)
VLDL: 36 mg/dL (ref 0–40)

## 2013-10-18 ENCOUNTER — Telehealth: Payer: Self-pay | Admitting: Emergency Medicine

## 2013-10-18 ENCOUNTER — Telehealth: Payer: Self-pay

## 2013-10-18 NOTE — Telephone Encounter (Signed)
Message copied by Dorothe Pea on Fri Oct 18, 2013 10:46 AM ------      Message from: Lorayne Marek      Created: Fri Oct 18, 2013  9:22 AM       Blood work reviewed, noticed elevated triglycerides, advise patient for low fat diet. ------

## 2013-10-18 NOTE — Telephone Encounter (Signed)
Patient not available Left message to return our call 

## 2013-10-18 NOTE — Telephone Encounter (Signed)
Message copied by Ricci Barker on Fri Oct 18, 2013 10:22 AM ------      Message from: Lorayne Marek      Created: Fri Oct 18, 2013  9:22 AM       Blood work reviewed, noticed elevated triglycerides, advise patient for low fat diet. ------

## 2013-10-26 ENCOUNTER — Other Ambulatory Visit: Payer: Self-pay | Admitting: Internal Medicine

## 2013-10-31 ENCOUNTER — Encounter: Payer: Self-pay | Admitting: Nurse Practitioner

## 2013-10-31 ENCOUNTER — Ambulatory Visit (INDEPENDENT_AMBULATORY_CARE_PROVIDER_SITE_OTHER): Payer: Medicaid Other | Admitting: Nurse Practitioner

## 2013-10-31 VITALS — BP 130/75 | HR 60 | Ht 72.0 in | Wt 260.0 lb

## 2013-10-31 DIAGNOSIS — G894 Chronic pain syndrome: Secondary | ICD-10-CM

## 2013-10-31 DIAGNOSIS — M5430 Sciatica, unspecified side: Secondary | ICD-10-CM

## 2013-10-31 NOTE — Progress Notes (Signed)
[facility]  [name] DOB: 1960/07/22   REASON FOR VISIT: Followup for pain in the neck and lower legs   HISTORY OF PRESENT ILLNESS: Jason Stokes, 53 year old male returns for followup. He was initially evaluated in this office or Dr. Jannifer Franklin 07/05/2013 for neuralgia on the right. EMG nerve conduction study done 8/22/ 2014 by Dr. Jannifer Franklin on both lower extremities and right upper extremity shows mild evidence of right carpal tunnel syndrome, no evidence of lumbar radiculopathy. MRI of the cervical spine was done and is relatively unremarkable. MRI of the lumbar spine with minimal disc bulge at L3-L4. The patient is on gabapentin and Flexeril and carbamazepine was added by Dr. Jannifer Franklin. He was also asked to get some physical therapy and a TENS unit. He has a TENS unit which he claims is beneficial. He also tells me he recently ran out of his Percocet and wants me to prescribe that, it is noted that he has had a positive drug screen for cocaine in the past. I made patient aware  we do not manage pain with narcotics. 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 He uses a cane for ambulation . He returns for reevaluation    HISTORY: history of low back pain and predominantly right leg pain. 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 EMG and nerve conduction study evaluation was done on the right leg previously were unremarkable. The patient has been placed on gabapentin, and he has been set up for epidural steroid injections. The patient gained only transient benefit with the epidural steroid injections. The patient has noted over the last 2 or 3 weeks that his pain level has increased. The patient is now reporting onset of sharp lancinating pains going from the right ulnar aspect of the hand, upper arm  to the shoulder, down the body into the right leg and into the left leg. The episodes of pain last 2-3 minutes, then go away. The patient may have 2 or 3 episodes a day. 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 The patient reports that he will fall on occasion. The patient uses a cane for ambulation. The patient returns for further evaluation.  REVIEW OF SYSTEMS: Full 14 system review of systems performed and notable only for those listed, all others are neg:  Constitutional: Fatigue Cardiovascular: N/A  Ear/Nose/Throat: N/A  Skin: N/A  Eyes: N/A  Respiratory: N/A  Gastroitestinal: N/A  Hematology/Lymphatic: N/A  Endocrine: N/A Musculoskeletal: Joint pain, back pain, neck pain  Allergy/Immunology: N/A  Neurological: Memory loss, tremors of the legs Psychiatric: Depression  ALLERGIES: Allergies  Allergen Reactions  . Lisinopril Anaphylaxis    Swelling of lips and tongue.    HOME MEDICATIONS: Outpatient Prescriptions Prior to Visit  Medication Sig Dispense Refill  . amoxicillin-clavulanate (AUGMENTIN) 875-125 MG per tablet Take 1 tablet by mouth 2 (two) times daily.  20 tablet  0  . atenolol (TENORMIN) 50 MG tablet Take 1 tablet (50 mg total) by mouth daily.  30 tablet  3  . benzonatate (TESSALON) 100 MG capsule Take 1 capsule (100 mg total) by mouth 3 (three) times daily as needed for cough.  30 capsule  1  . carbamazepine (TEGRETOL) 200 MG tablet One half tablet twice a day for 2 weeks, then take one tablet twice a day  60 tablet  3  . cyclobenzaprine (FLEXERIL) 10 MG tablet Take 1 tablet (10 mg total) by mouth 3 (three) times daily as needed for muscle spasms.  60 tablet  0  . gabapentin (NEURONTIN) 300 MG capsule TAKE ONE CAPSULE BY MOUTH AT BEDTIME  30 capsule  3  . gabapentin (NEURONTIN) 400 MG capsule Take 1 capsule (400 mg total) by mouth 3  (three) times daily.  90 capsule  3  . oxyCODONE-acetaminophen (PERCOCET) 7.5-325 MG per tablet Take 1 tablet by mouth every 6 (six) hours as needed for pain.  60 tablet  0   No facility-administered medications prior to visit.    PAST MEDICAL HISTORY: Past Medical History  Diagnosis Date  . Chronic back pain   . Hypertension   . Depression   . Small vessel disease     Right basal ganglia stroke  . History of alcohol abuse   . Sciatica neuralgia     PAST SURGICAL HISTORY: Past Surgical History  Procedure Laterality Date  . Intercostal nerve block  2005  . Surgery back of head  1996-97    FAMILY HISTORY: Family History  Problem Relation Age of Onset  . Heart disease Father     SOCIAL HISTORY: History   Social History  . Marital Status: Single    Spouse Name: [name]    Number of Children: 3  . Years of Education: HS   Occupational History  .      disable   Social History Main Topics  . Smoking status: Current Some Day Smoker -- 0.30 packs/day    Types: Cigarettes  . Smokeless tobacco: Never Used  . Alcohol Use: No     Comment: History of alcohol abuse, marijuana use, cocaine use  . Drug Use: Yes    Special: Marijuana, Cocaine  . Sexual Activity: Yes   Other Topics Concern  . Not on file   Social History Narrative   Patient lives at home with [place].    Patient has 3 children 2 step.    Patient has 13 years of schooling.    Patient is right handed.      PHYSICAL EXAM  Filed Vitals:   10/31/13 1014  BP: 130/75  Pulse: 60  Height: 6' (1.829 m)  Weight: 260 lb (117.935 kg)   Body mass index is 35.25 kg/(m^2).  Generalized: Well developed, obese male in no acute distress  Head: normocephalic and atraumatic,. Oropharynx benign  Neck: Supple, no carotid bruits   Musculoskeletal: No deformity   Neurological examination   Mentation: Alert oriented to time, place, history taking. Follows all commands speech and language fluent  Cranial nerve  II-XII: Pupils were equal round reactive to light extraocular movements were full, visual field were full on confrontational test. Facial sensation and strength were normal. hearing was intact to finger rubbing bilaterally. Uvula tongue midline. head turning and shoulder shrug were normal and symmetric.Tongue protrusion into cheek strength was normal. Motor: normal bulk and tone, full strength in the BUE, BLE,  No focal weakness Sensory: Decreased sensation to soft touch on the right arm and leg as compared to the left decreased on the right face as compared to the left. The patient splits the midline with vibratory sensation over fore head decreased on the right. Vibratory sensation is decreased in the right hand as compared to  the left   Coordination: finger-nose-finger, heel-to-shin bilaterally, no dysmetria, the patient had 2 episodes of extensor tremors of the right leg lasting 5-10 seconds Reflexes: Brachioradialis 2/2, biceps 2/2, triceps 2/2, patellar 2/2, Achilles 2/2, plantar responses were flexor bilaterally. Gait and Station: Rising up from seated position without assistance, patient has a limping gait on the right and uses a cane. Tandem gait is normal Romberg is negative  DIAGNOSTIC DATA (LABS, IMAGING, TESTING) - I reviewed patient records, labs, notes, testing and imaging myself where available.  Lab Results  Component Value Date   WBC 4.6 10/04/2013   HGB 13.8 10/04/2013   HCT 43.0 10/04/2013   MCV 76.2* 10/04/2013   PLT 182 10/04/2013      Component Value Date/Time   NA 138 10/04/2013 1102   K 3.9 10/04/2013 1102   CL 104 10/04/2013 1102   CO2 26 10/04/2013 1102   GLUCOSE 125* 10/04/2013 1102   BUN 14 10/04/2013 1102   CREATININE 1.26 10/04/2013 1102   CREATININE 1.27 03/01/2010 0320   CALCIUM 9.5 10/04/2013 1102   PROT 7.5 10/04/2013 1102   ALBUMIN 4.2 10/04/2013 1102   AST 15 10/04/2013 1102   ALT 21 10/04/2013 1102   ALKPHOS 62 10/04/2013 1102   BILITOT 0.4 10/04/2013 1102    GFRNONAA 65 10/04/2013 1102   GFRNONAA >60 03/01/2010 0320   GFRAA 75 10/04/2013 1102   GFRAA  Value: >60        The eGFR has been calculated using the MDRD equation. This calculation has not been validated in all clinical situations. eGFR's persistently <60 mL/min signify possible Chronic Kidney Disease. 03/01/2010 0320   Lab Results  Component Value Date   CHOL 174 10/17/2013   HDL 32* 10/17/2013   LDLCALC 106* 10/17/2013   TRIG 180* 10/17/2013   CHOLHDL 5.4 10/17/2013    ASSESSMENT AND PLAN  53 y.o. year old male  has a past medical history of Chronic back pain; fictitious tremor right lower extremity, nonorganic neurologic exam. Patient is also complaining of neck pain today.  Patient was seen in consultation with Dr. Jannifer Franklin Continue TENS unit Continue carbamazepine, gabapentin, and Flexeril, no narcotics Physical therapy to evaluate neck and  back pain  Followup in 6-8 months Dennie Bible, [facility], [name], [facility] [address] [place], [place] [address] [phone]

## 2013-10-31 NOTE — Progress Notes (Signed)
I have read the note, and I agree with the clinical assessment and plan.  Charles K Willis   

## 2013-10-31 NOTE — Patient Instructions (Signed)
Continue TENS unit Continue carbamazepine, gabapentin, and Flexeril Continue your moderate exercise and back exercises prescribed by physical therapy Followup in 6-8 months

## 2013-11-19 ENCOUNTER — Ambulatory Visit: Payer: Medicaid Other | Attending: Nurse Practitioner

## 2013-11-19 DIAGNOSIS — R262 Difficulty in walking, not elsewhere classified: Secondary | ICD-10-CM | POA: Diagnosis not present

## 2013-11-19 DIAGNOSIS — M545 Low back pain, unspecified: Secondary | ICD-10-CM | POA: Insufficient documentation

## 2013-11-19 DIAGNOSIS — M62838 Other muscle spasm: Secondary | ICD-10-CM | POA: Insufficient documentation

## 2013-11-19 DIAGNOSIS — IMO0001 Reserved for inherently not codable concepts without codable children: Secondary | ICD-10-CM | POA: Diagnosis not present

## 2013-11-25 ENCOUNTER — Ambulatory Visit: Payer: Medicaid Other | Attending: Internal Medicine | Admitting: Internal Medicine

## 2013-11-25 ENCOUNTER — Encounter: Payer: Self-pay | Admitting: Internal Medicine

## 2013-11-25 VITALS — BP 144/84 | HR 60 | Temp 98.4°F | Resp 17

## 2013-11-25 DIAGNOSIS — J3489 Other specified disorders of nose and nasal sinuses: Secondary | ICD-10-CM | POA: Insufficient documentation

## 2013-11-25 DIAGNOSIS — E785 Hyperlipidemia, unspecified: Secondary | ICD-10-CM | POA: Insufficient documentation

## 2013-11-25 DIAGNOSIS — G894 Chronic pain syndrome: Secondary | ICD-10-CM

## 2013-11-25 DIAGNOSIS — M545 Low back pain, unspecified: Secondary | ICD-10-CM

## 2013-11-25 DIAGNOSIS — G8929 Other chronic pain: Secondary | ICD-10-CM

## 2013-11-25 DIAGNOSIS — R0981 Nasal congestion: Secondary | ICD-10-CM

## 2013-11-25 DIAGNOSIS — I10 Essential (primary) hypertension: Secondary | ICD-10-CM | POA: Insufficient documentation

## 2013-11-25 DIAGNOSIS — F17200 Nicotine dependence, unspecified, uncomplicated: Secondary | ICD-10-CM | POA: Insufficient documentation

## 2013-11-25 MED ORDER — FLUTICASONE PROPIONATE 50 MCG/ACT NA SUSP: 2.0000 | Freq: Every day | NASAL | Status: DC

## 2013-11-25 MED ORDER — GABAPENTIN 400 MG PO CAPS: 400.0000 mg | ORAL_CAPSULE | Freq: Three times a day (TID) | ORAL | Status: DC

## 2013-11-25 NOTE — Progress Notes (Signed)
Patient here for follow up Complains of pain to his back that radiates up his neck Complains of pain to his knees At night his sinus's are blocked and makes it hard for him to sleep

## 2013-11-25 NOTE — Patient Instructions (Signed)
Fat and Cholesterol Control Diet  Fat and cholesterol levels in your blood and organs are influenced by your diet. High levels of fat and cholesterol may lead to diseases of the heart, small and large blood vessels, gallbladder, liver, and pancreas.  CONTROLLING FAT AND CHOLESTEROL WITH DIET  Although exercise and lifestyle factors are important, your diet is key. That is because certain foods are known to raise cholesterol and others to lower it. The goal is to balance foods for their effect on cholesterol and more importantly, to replace saturated and trans fat with other types of fat, such as monounsaturated fat, polyunsaturated fat, and omega-3 fatty acids.  On average, a person should consume no more than 15 to 17 g of saturated fat daily. Saturated and trans fats are considered "bad" fats, and they will raise LDL cholesterol. Saturated fats are primarily found in animal products such as meats, butter, and cream. However, that does not mean you need to give up all your favorite foods. Today, there are good tasting, low-fat, low-cholesterol substitutes for most of the things you like to eat. Choose low-fat or nonfat alternatives. Choose round or loin cuts of red meat. These types of cuts are lowest in fat and cholesterol. Chicken (without the skin), fish, veal, and ground turkey breast are great choices. Eliminate fatty meats, such as hot dogs and salami. Even shellfish have little or no saturated fat. Have a 3 oz (85 g) portion when you eat lean meat, poultry, or fish.  Trans fats are also called "partially hydrogenated oils." They are oils that have been scientifically manipulated so that they are solid at room temperature resulting in a longer shelf life and improved taste and texture of foods in which they are added. Trans fats are found in stick margarine, some tub margarines, cookies, crackers, and baked goods.   When baking and cooking, oils are a great substitute for butter. The monounsaturated oils are  especially beneficial since it is believed they lower LDL and raise HDL. The oils you should avoid entirely are saturated tropical oils, such as coconut and palm.   Remember to eat a lot from food groups that are naturally free of saturated and trans fat, including fish, fruit, vegetables, beans, grains (barley, rice, couscous, bulgur wheat), and pasta (without cream sauces).   IDENTIFYING FOODS THAT LOWER FAT AND CHOLESTEROL   Soluble fiber may lower your cholesterol. This type of fiber is found in fruits such as apples, vegetables such as broccoli, potatoes, and carrots, legumes such as beans, peas, and lentils, and grains such as barley. Foods fortified with plant sterols (phytosterol) may also lower cholesterol. You should eat at least 2 g per day of these foods for a cholesterol lowering effect.   Read package labels to identify low-saturated fats, trans fat free, and low-fat foods at the supermarket. Select cheeses that have only 2 to 3 g saturated fat per ounce. Use a heart-healthy tub margarine that is free of trans fats or partially hydrogenated oil. When buying baked goods (cookies, crackers), avoid partially hydrogenated oils. Breads and muffins should be made from whole grains (whole-wheat or whole oat flour, instead of "flour" or "enriched flour"). Buy non-creamy canned soups with reduced salt and no added fats.   FOOD PREPARATION TECHNIQUES   Never deep-fry. If you must fry, either stir-fry, which uses very little fat, or use non-stick cooking sprays. When possible, broil, bake, or roast meats, and steam vegetables. Instead of putting butter or margarine on vegetables, use lemon   and herbs, applesauce, and cinnamon (for squash and sweet potatoes). Use nonfat yogurt, salsa, and low-fat dressings for salads.   LOW-SATURATED FAT / LOW-FAT FOOD SUBSTITUTES  Meats / Saturated Fat (g)  · Avoid: Steak, marbled (3 oz/85 g) / 11 g  · Choose: Steak, lean (3 oz/85 g) / 4 g  · Avoid: Hamburger (3 oz/85 g) / 7  g  · Choose: Hamburger, lean (3 oz/85 g) / 5 g  · Avoid: Ham (3 oz/85 g) / 6 g  · Choose: Ham, lean cut (3 oz/85 g) / 2.4 g  · Avoid: Chicken, with skin, dark meat (3 oz/85 g) / 4 g  · Choose: Chicken, skin removed, dark meat (3 oz/85 g) / 2 g  · Avoid: Chicken, with skin, light meat (3 oz/85 g) / 2.5 g  · Choose: Chicken, skin removed, light meat (3 oz/85 g) / 1 g  Dairy / Saturated Fat (g)  · Avoid: Whole milk (1 cup) / 5 g  · Choose: Low-fat milk, 2% (1 cup) / 3 g  · Choose: Low-fat milk, 1% (1 cup) / 1.5 g  · Choose: Skim milk (1 cup) / 0.3 g  · Avoid: Hard cheese (1 oz/28 g) / 6 g  · Choose: Skim milk cheese (1 oz/28 g) / 2 to 3 g  · Avoid: Cottage cheese, 4% fat (1 cup) / 6.5 g  · Choose: Low-fat cottage cheese, 1% fat (1 cup) / 1.5 g  · Avoid: Ice cream (1 cup) / 9 g  · Choose: Sherbet (1 cup) / 2.5 g  · Choose: Nonfat frozen yogurt (1 cup) / 0.3 g  · Choose: Frozen fruit bar / trace  · Avoid: Whipped cream (1 tbs) / 3.5 g  · Choose: Nondairy whipped topping (1 tbs) / 1 g  Condiments / Saturated Fat (g)  · Avoid: Mayonnaise (1 tbs) / 2 g  · Choose: Low-fat mayonnaise (1 tbs) / 1 g  · Avoid: Butter (1 tbs) / 7 g  · Choose: Extra light margarine (1 tbs) / 1 g  · Avoid: Coconut oil (1 tbs) / 11.8 g  · Choose: Olive oil (1 tbs) / 1.8 g  · Choose: Corn oil (1 tbs) / 1.7 g  · Choose: Safflower oil (1 tbs) / 1.2 g  · Choose: Sunflower oil (1 tbs) / 1.4 g  · Choose: Soybean oil (1 tbs) / 2.4 g  · Choose: Canola oil (1 tbs) / 1 g  Document Released: 07/04/2005 Document Revised: 10/29/2012 Document Reviewed: 12/23/2010  [employer]® Patient Information ©2014 [employer].

## 2013-11-25 NOTE — Progress Notes (Signed)
MRN: [id number] Name: Jason Stokes  Sex: male Age: 53 y.o. DOB: 19-Jul-1960  Allergies: Lisinopril  Chief Complaint  Patient presents with  . Follow-up    back pain    HPI: Patient is 53 y.o. male who has to of hypertension, chronic back pain, comes today for followup currently following up with the neurologist and has been referred to physical therapy, patient is again asking for narcotic medication, had been referred to pain management in the past and has missed appointment, also reported to have lot of allergy symptoms nasal congestion denies any fever chills sore throat any chest and shortness of breath does smoke cigarettes everyday.  Past Medical History  Diagnosis Date  . Chronic back pain   . Hypertension   . Depression   . Small vessel disease     Right basal ganglia stroke  . History of alcohol abuse   . Sciatica neuralgia     Past Surgical History  Procedure Laterality Date  . Intercostal nerve block  2005  . Surgery back of head  1996-97      Medication List       This list is accurate as of: 11/25/13 10:25 AM.  Always use your most recent med list.               amoxicillin-clavulanate 875-125 MG per tablet  Commonly known as:  AUGMENTIN  Take 1 tablet by mouth 2 (two) times daily.     atenolol 50 MG tablet  Commonly known as:  TENORMIN  Take 1 tablet (50 mg total) by mouth daily.     benzonatate 100 MG capsule  Commonly known as:  TESSALON  Take 1 capsule (100 mg total) by mouth 3 (three) times daily as needed for cough.     carbamazepine 200 MG tablet  Commonly known as:  TEGRETOL  One half tablet twice a day for 2 weeks, then take one tablet twice a day     cyclobenzaprine 10 MG tablet  Commonly known as:  FLEXERIL  Take 1 tablet (10 mg total) by mouth 3 (three) times daily as needed for muscle spasms.     fluticasone 50 MCG/ACT nasal spray  Commonly known as:  FLONASE  Place 2 sprays into both nostrils daily.     gabapentin 400  MG capsule  Commonly known as:  NEURONTIN  Take 1 capsule (400 mg total) by mouth 3 (three) times daily.     oxyCODONE-acetaminophen 7.5-325 MG per tablet  Commonly known as:  PERCOCET  Take 1 tablet by mouth every 6 (six) hours as needed for pain.        Meds ordered this encounter  Medications  . fluticasone (FLONASE) 50 MCG/ACT nasal spray    Sig: Place 2 sprays into both nostrils daily.    Dispense:  16 g    Refill:  6    Immunization History  Administered Date(s) Administered  . Influenza Split 05/09/2013    Family History  Problem Relation Age of Onset  . Heart disease Father     History  Substance Use Topics  . Smoking status: Current Some Day Smoker -- 0.30 packs/day    Types: Cigarettes  . Smokeless tobacco: Never Used  . Alcohol Use: No     Comment: History of alcohol abuse, marijuana use, cocaine use    Review of Systems   As noted in HPI  Filed Vitals:   11/25/13 0938  BP: 144/84  Pulse: 60  Temp: 98.4  F (36.9 C)  Resp: 17    Physical Exam  Physical Exam  HENT:  Nasal congestion no sinus tenderness  Eyes: EOM are normal. Pupils are equal, round, and reactive to light.  Cardiovascular: Normal rate and regular rhythm.   Pulmonary/Chest: Breath sounds normal. No respiratory distress. He has no wheezes. He has no rales.  Musculoskeletal:  Low lumbar spinal and paraspinal tenderness      CBC    Component Value Date/Time   WBC 4.6 10/04/2013 1102   RBC 5.64 10/04/2013 1102   HGB 13.8 10/04/2013 1102   HCT 43.0 10/04/2013 1102   PLT 182 10/04/2013 1102   MCV 76.2* 10/04/2013 1102   LYMPHSABS 1.3 10/04/2013 1102   MONOABS 0.4 10/04/2013 1102   EOSABS 0.1 10/04/2013 1102   BASOSABS 0.0 10/04/2013 1102    CMP     Component Value Date/Time   NA 138 10/04/2013 1102   K 3.9 10/04/2013 1102   CL 104 10/04/2013 1102   CO2 26 10/04/2013 1102   GLUCOSE 125* 10/04/2013 1102   BUN 14 10/04/2013 1102   CREATININE 1.26 10/04/2013 1102   CREATININE 1.27  03/01/2010 0320   CALCIUM 9.5 10/04/2013 1102   PROT 7.5 10/04/2013 1102   ALBUMIN 4.2 10/04/2013 1102   AST 15 10/04/2013 1102   ALT 21 10/04/2013 1102   ALKPHOS 62 10/04/2013 1102   BILITOT 0.4 10/04/2013 1102   GFRNONAA 65 10/04/2013 1102   GFRNONAA >60 03/01/2010 0320   GFRAA 75 10/04/2013 1102   GFRAA  Value: >60        The eGFR has been calculated using the MDRD equation. This calculation has not been validated in all clinical situations. eGFR's persistently <60 mL/min signify possible Chronic Kidney Disease. 03/01/2010 0320    Lab Results  Component Value Date/Time   CHOL 174 10/17/2013 10:42 AM    No components found with this basename: hga1c    Lab Results  Component Value Date/Time   AST 15 10/04/2013 11:02 AM    Assessment and Plan  Chronic pain syndrome - Plan: I have refilled Neurontin, also referred Ambulatory referral to Pain Clinic  Hypertension Borderline elevated, continue with atenolol, low salt diet.  Nasal congestion - Plan: fluticasone (FLONASE) 50 MCG/ACT nasal spray  Other and unspecified hyperlipidemia Continue with low-fat diet, will repeat fasting lipid panel on next visit.  Return in about 4 months (around 03/28/2014) for hyperipidemia, back pain.  Lorayne Marek, MD

## 2014-01-03 ENCOUNTER — Ambulatory Visit: Payer: Medicaid Other | Admitting: Nurse Practitioner

## 2014-01-23 ENCOUNTER — Other Ambulatory Visit: Payer: Self-pay | Admitting: Internal Medicine

## 2014-02-03 ENCOUNTER — Telehealth: Payer: Self-pay | Admitting: Internal Medicine

## 2014-02-03 ENCOUNTER — Encounter (HOSPITAL_COMMUNITY): Payer: Self-pay | Admitting: Emergency Medicine

## 2014-02-03 ENCOUNTER — Emergency Department (HOSPITAL_COMMUNITY)
Admission: EM | Admit: 2014-02-03 | Discharge: 2014-02-03 | Disposition: A | Discharge status: Home / Self Care | Payer: Medicaid Other | Attending: Emergency Medicine | Admitting: Emergency Medicine

## 2014-02-03 DIAGNOSIS — M545 Low back pain, unspecified: Secondary | ICD-10-CM | POA: Insufficient documentation

## 2014-02-03 DIAGNOSIS — M549 Dorsalgia, unspecified: Secondary | ICD-10-CM | POA: Diagnosis present

## 2014-02-03 DIAGNOSIS — I10 Essential (primary) hypertension: Secondary | ICD-10-CM

## 2014-02-03 DIAGNOSIS — G8929 Other chronic pain: Secondary | ICD-10-CM | POA: Insufficient documentation

## 2014-02-03 DIAGNOSIS — Z8673 Personal history of transient ischemic attack (TIA), and cerebral infarction without residual deficits: Secondary | ICD-10-CM | POA: Insufficient documentation

## 2014-02-03 DIAGNOSIS — Z79899 Other long term (current) drug therapy: Secondary | ICD-10-CM | POA: Diagnosis not present

## 2014-02-03 DIAGNOSIS — F17200 Nicotine dependence, unspecified, uncomplicated: Secondary | ICD-10-CM | POA: Diagnosis not present

## 2014-02-03 DIAGNOSIS — Z8659 Personal history of other mental and behavioral disorders: Secondary | ICD-10-CM | POA: Diagnosis not present

## 2014-02-03 MED ORDER — DIAZEPAM 5 MG PO TABS: 5.0000 mg | ORAL_TABLET | Freq: Three times a day (TID) | ORAL | Status: DC | PRN

## 2014-02-03 MED ORDER — OXYCODONE-ACETAMINOPHEN 7.5-325 MG PO TABS: 1.0000 | ORAL_TABLET | Freq: Four times a day (QID) | ORAL | Status: DC | PRN

## 2014-02-03 MED ORDER — GABAPENTIN 400 MG PO CAPS: 400.0000 mg | ORAL_CAPSULE | Freq: Three times a day (TID) | ORAL | Status: DC

## 2014-02-03 MED ORDER — ATENOLOL 50 MG PO TABS: 50.0000 mg | ORAL_TABLET | Freq: Every day | ORAL | Status: DC

## 2014-02-03 NOTE — ED Notes (Signed)
Pt c/o lower back pain for about a week. Pt denies any injury or lifting moving that could have caused the pain.

## 2014-02-03 NOTE — Discharge Instructions (Signed)
Take percocet and neurontin. Do NOT drive with percocet.   Take valium for muscle spasms.   Take your hypertension meds as prescribed.   You need to see your doctor and may need another referral to pain specialist.   Return to ER if you have worsening pain, falls, numbness, trouble walking.

## 2014-02-03 NOTE — ED Provider Notes (Signed)
CSN: [id number]     Arrival date & time 02/03/14  0932 History   First MD Initiated Contact with Patient 02/03/14 [phone]     Chief Complaint  Patient presents with  . Back Pain     (Consider location/radiation/quality/duration/timing/severity/associated sxs/prior Treatment) The history is provided by the patient.  Jason Stokes is a 53 y.o. male hx of HTN, chronic back pain, sciatica here with back pain. Patient has chronic back pain and had intercostal nerve block and multiple injections in the past. He had been on percocet but was placed on neurontin recently. He was referred to pain clinic but had bad experience there so didn't want to go back. Worsening of chronic back pain for the last week. Denies fall or injury. Had R leg sciatica that is chronic but over the last week developed L leg sciatica symptoms. Able to walk to crutches. He denies fever. Denies trouble urinating or numbness. [facility] but was told that he didn't have an appointment so came here for eval.    Past Medical History  Diagnosis Date  . Chronic back pain   . Hypertension   . Depression   . Small vessel disease     Right basal ganglia stroke  . History of alcohol abuse   . Sciatica neuralgia    Past Surgical History  Procedure Laterality Date  . Intercostal nerve block  2005  . Surgery back of head  1996-97   Family History  Problem Relation Age of Onset  . Heart disease Father    History  Substance Use Topics  . Smoking status: Current Some Day Smoker -- 0.30 packs/day    Types: Cigarettes  . Smokeless tobacco: Never Used  . Alcohol Use: No     Comment: History of alcohol abuse, marijuana use, cocaine use    Review of Systems  Musculoskeletal: Positive for back pain.  All other systems reviewed and are negative.     Allergies  Lisinopril  Home Medications   Prior to Admission medications   Medication Sig Start Date End Date Taking? Authorizing  Provider  atenolol (TENORMIN) 50 MG tablet Take 1 tablet (50 mg total) by mouth daily. 05/09/13   Robbie Lis, MD  carbamazepine (TEGRETOL) 200 MG tablet One half tablet twice a day for 2 weeks, then take one tablet twice a day 07/05/13   Kathrynn Ducking, MD  cyclobenzaprine (FLEXERIL) 10 MG tablet Take 1 tablet (10 mg total) by mouth 3 (three) times daily as needed for muscle spasms. 10/04/13   Reyne Dumas, MD  fluticasone (FLONASE) 50 MCG/ACT nasal spray Place 2 sprays into both nostrils daily. 11/25/13   Lorayne Marek, MD  gabapentin (NEURONTIN) 400 MG capsule Take 1 capsule (400 mg total) by mouth 3 (three) times daily. 11/25/13   Lorayne Marek, MD  oxyCODONE-acetaminophen (PERCOCET) 7.5-325 MG per tablet Take 1 tablet by mouth every 6 (six) hours as needed for pain. 08/14/13   Lorayne Marek, MD   BP 168/108  Pulse 97  Temp(Src) 99.1 F (37.3 C) (Oral)  Resp 19  SpO2 100% Physical Exam  Nursing note and vitals reviewed. Constitutional: He is oriented to person, place, and time.  Uncomfortable   HENT:  Head: Normocephalic.  Eyes: Conjunctivae are normal. Pupils are equal, round, and reactive to light.  Neck: Normal range of motion. Neck supple.  Cardiovascular: Normal rate, regular rhythm and normal heart sounds.   Pulmonary/Chest: Effort normal and breath sounds  normal. No respiratory distress. He has no wheezes. He has no rales.  Abdominal: Soft. Bowel sounds are normal. He exhibits no distension. There is no tenderness. There is no rebound and no guarding.  Musculoskeletal: Normal range of motion.  Bilateral paralumbar tenderness. Has back brace on   Neurological: He is alert and oriented to person, place, and time.  No saddle anesthesia. Able to ambulate with crutches   Skin: Skin is warm and dry.  Psychiatric: He has a normal mood and affect. His behavior is normal. Judgment and thought content normal.    ED Course  Procedures (including critical care time) Labs  Review Labs Reviewed - No data to display  Imaging Review No results found.   EKG Interpretation None      MDM   Final diagnoses:  None    Jason Stokes is a 53 y.o. male here with worsening chronic back pain. Neurologic exam unremarkable. Also hypertensive likely from pain. Will refill percocet, HTN meds.      Wandra Arthurs, MD 02/03/14 1014

## 2014-02-03 NOTE — Telephone Encounter (Signed)
Pt. Came in today stating he had an appointment, stated he received a call on Friday informing him of this appt...Pt. Did not have an actual appt. Today, he is very upset and walked away, he did state he needed refills on medications.... Can patient be called in regards to medication.

## 2014-02-04 ENCOUNTER — Telehealth: Payer: Self-pay | Admitting: Nurse Practitioner

## 2014-02-04 ENCOUNTER — Telehealth: Payer: Self-pay

## 2014-02-04 NOTE — Telephone Encounter (Signed)
Patient requesting sooner appointment than October for severe left leg pain. Please call to advise.

## 2014-02-04 NOTE — Telephone Encounter (Signed)
Returned patient call Patient not available Left message with family member  To have him return our call

## 2014-02-07 NOTE — Telephone Encounter (Signed)
Patient said that he would like a sooner appt.for leg pain in the left now, same as the right leg, went to ER , only wants to f/u with Dr Jannifer Franklin for this visit, to find out what is going on. Scheduled / confirmed appt.

## 2014-02-07 NOTE — Telephone Encounter (Signed)
A revisit appointment has been set up for 02/12/2014. I will see the patient then.

## 2014-02-12 ENCOUNTER — Encounter: Payer: Self-pay | Admitting: Neurology

## 2014-02-12 ENCOUNTER — Other Ambulatory Visit: Payer: Medicaid Other

## 2014-02-12 ENCOUNTER — Ambulatory Visit (INDEPENDENT_AMBULATORY_CARE_PROVIDER_SITE_OTHER): Payer: Medicaid Other | Admitting: Neurology

## 2014-02-12 VITALS — BP 150/101 | HR 61 | Wt 260.0 lb

## 2014-02-12 DIAGNOSIS — M545 Low back pain, unspecified: Secondary | ICD-10-CM

## 2014-02-12 DIAGNOSIS — G8929 Other chronic pain: Secondary | ICD-10-CM

## 2014-02-12 DIAGNOSIS — M5430 Sciatica, unspecified side: Secondary | ICD-10-CM

## 2014-02-12 DIAGNOSIS — Z5181 Encounter for therapeutic drug level monitoring: Secondary | ICD-10-CM

## 2014-02-12 MED ORDER — PREDNISONE 10 MG PO TABS: ORAL_TABLET | ORAL | Status: DC

## 2014-02-12 NOTE — Patient Instructions (Signed)
Back Pain, Adult Low back pain is very common. About 1 in 5 people have back pain.The cause of low back pain is rarely dangerous. The pain often gets better over time.About half of people with a sudden onset of back pain feel better in just 2 weeks. About 8 in 10 people feel better by 6 weeks.  CAUSES Some common causes of back pain include:  Strain of the muscles or ligaments supporting the spine.  Wear and tear (degeneration) of the spinal discs.  Arthritis.  Direct injury to the back. DIAGNOSIS Most of the time, the direct cause of low back pain is not known.However, back pain can be treated effectively even when the exact cause of the pain is unknown.Answering your caregiver's questions about your overall health and symptoms is one of the most accurate ways to make sure the cause of your pain is not dangerous. If your caregiver needs more information, he or she may order lab work or imaging tests (X-rays or MRIs).However, even if imaging tests show changes in your back, this usually does not require surgery. HOME CARE INSTRUCTIONS For many people, back pain returns.Since low back pain is rarely dangerous, it is often a condition that people can learn to manageon their own.   Remain active. It is stressful on the back to sit or stand in one place. Do not sit, drive, or stand in one place for more than 30 minutes at a time. Take short walks on level surfaces as soon as pain allows.Try to increase the length of time you walk each day.  Do not stay in bed.Resting more than 1 or 2 days can delay your recovery.  Do not avoid exercise or work.Your body is made to move.It is not dangerous to be active, even though your back may hurt.Your back will likely heal faster if you return to being active before your pain is gone.  Pay attention to your body when you bend and lift. Many people have less discomfortwhen lifting if they bend their knees, keep the load close to their bodies,and  avoid twisting. Often, the most comfortable positions are those that put less stress on your recovering back.  Find a comfortable position to sleep. Use a firm mattress and lie on your side with your knees slightly bent. If you lie on your back, put a pillow under your knees.  Only take over-the-counter or prescription medicines as directed by your caregiver. Over-the-counter medicines to reduce pain and inflammation are often the most helpful.Your caregiver may prescribe muscle relaxant drugs.These medicines help dull your pain so you can more quickly return to your normal activities and healthy exercise.  Put ice on the injured area.  Put ice in a plastic bag.  Place a towel between your skin and the bag.  Leave the ice on for 15-20 minutes, 03-04 times a day for the first 2 to 3 days. After that, ice and heat may be alternated to reduce pain and spasms.  Ask your caregiver about trying back exercises and gentle massage. This may be of some benefit.  Avoid feeling anxious or stressed.Stress increases muscle tension and can worsen back pain.It is important to recognize when you are anxious or stressed and learn ways to manage it.Exercise is a great option. SEEK MEDICAL CARE IF:  You have pain that is not relieved with rest or medicine.  You have pain that does not improve in 1 week.  You have new symptoms.  You are generally not feeling well. SEEK   IMMEDIATE MEDICAL CARE IF:   You have pain that radiates from your back into your legs.  You develop new bowel or bladder control problems.  You have unusual weakness or numbness in your arms or legs.  You develop nausea or vomiting.  You develop abdominal pain.  You feel faint. Document Released: 07/04/2005 Document Revised: 01/03/2012 Document Reviewed: 11/05/2013 ExitCare Patient Information 2015 [employer]. This information is not intended to replace advice given to you by your health care provider. Make sure you  discuss any questions you have with your health care provider.  

## 2014-02-12 NOTE — Progress Notes (Signed)
Reason for visit: Back pain, leg pain  AIRON Stokes is an 53 y.o. male  History of present illness:  Jason Stokes is a 53 year old right-handed black male with a history of chronic low back pain and right leg pain. In the past, he has had "tremors" involving the right leg that are felt to be factitious in nature. The patient in the past as been seen by a pain center, but he was unable to continue going as a urine drug screen done in June of 2014 was positive for [facility] and cocaine. The patient uses a TENS unit without much benefit. Within the last 2 weeks, the patient indicates that he has developed the same type of pain now down the left leg again associated with the "tremors". The patient walks with a cane. He went to the emergency room and got some Percocet, and he wants more of this medication. In the past, he has gotten epidural steroid injections that have offered only one or 2 weeks of benefit for the pain. He believes that he is weak in the legs. He comes to this office for an evaluation. The patient denies any problems controlling the bowels or the bladder.  Past Medical History  Diagnosis Date  . Chronic back pain   . Hypertension   . Depression   . Small vessel disease     Right basal ganglia stroke  . History of alcohol abuse   . Sciatica neuralgia     Past Surgical History  Procedure Laterality Date  . Intercostal nerve block  2005  . Surgery back of head  1996-97    Family History  Problem Relation Age of Onset  . Heart disease Father     Social history:  reports that he has been smoking Cigarettes.  He has been smoking about 0.30 packs per day. He has never used smokeless tobacco. He reports that he uses illicit drugs (Marijuana and Cocaine). He reports that he does not drink alcohol.    Allergies  Allergen Reactions  . Lisinopril Anaphylaxis    Swelling of lips and tongue.    Medications:  Current Outpatient Prescriptions on File Prior to Visit  Medication  Sig Dispense Refill  . atenolol (TENORMIN) 50 MG tablet Take 1 tablet (50 mg total) by mouth daily.  30 tablet  0  . carbamazepine (TEGRETOL) 200 MG tablet One half tablet twice a day for 2 weeks, then take one tablet twice a day  60 tablet  3  . cyclobenzaprine (FLEXERIL) 10 MG tablet Take 1 tablet (10 mg total) by mouth 3 (three) times daily as needed for muscle spasms.  60 tablet  0  . diazepam (VALIUM) 5 MG tablet Take 1 tablet (5 mg total) by mouth every 8 (eight) hours as needed for muscle spasms (spasms).  10 tablet  0  . fluticasone (FLONASE) 50 MCG/ACT nasal spray Place 2 sprays into both nostrils daily.  16 g  6  . gabapentin (NEURONTIN) 400 MG capsule Take 1 capsule (400 mg total) by mouth 3 (three) times daily.  30 capsule  0  . oxyCODONE-acetaminophen (PERCOCET) 7.5-325 MG per tablet Take 1 tablet by mouth every 6 (six) hours as needed for pain.  15 tablet  0   No current facility-administered medications on file prior to visit.    ROS:  Out of a complete 14 system review of symptoms, the patient complains only of the following symptoms, and all other reviewed systems are negative.  Ringing  in the ears Environmental allergies, food allergies Back pain, walking difficulty  Blood pressure 150/101, pulse 61, weight 260 lb (117.935 kg).  Physical Exam  General: The patient is alert and cooperative at the time of the examination.  Neuromuscular: The patient can flex the low back only about 15. He has minimal rotational movement of the low back.  Skin: No significant peripheral edema is noted.   Neurologic Exam  Mental status: The patient is oriented x 3.  Cranial nerves: Facial symmetry is present. Speech is normal, no aphasia or dysarthria is noted. Extraocular movements are full. Visual fields are full.  Motor: The patient has good strength in all 4 extremities.  Sensory examination: The patient has decreased soft touch and vibration sensation involving the left leg.  Symmetric soft touch sensation on the arms, decreased vibration sensation on the left arm. Decreased soft touch sensation on the left face, but he does not split the midline with vibration sensation.  Coordination: The patient has good finger-nose-finger and heel-to-shin bilaterally. The patient demonstrates intermittent extension "tremors" of the left leg.  Gait and station: The patient has a limping type gait. The patient uses a cane for ambulation. Tandem gait was not attempted. Romberg is negative. No drift is seen.  Reflexes: Deep tendon reflexes are symmetric, but are depressed.   MRI lumbar 02/21/13:  IMPRESSION:  1. New small central subligamentous disc protrusion at L4-5 in the  midline without focal neural impingement.  2. Minimal stable disc bulge at L3-4.     Assessment/Plan:  1. Chronic low back pain, bilateral leg pain  2. History of lower extremity "tremors", likely factitious  3. History of polysubstance abuse, marijuana and cocaine  The patient does have evidence of a subligamentous disc herniation at L4-5 level. The patient has developed new numbness and pain involving the left leg. The patient will be set up for MRI evaluation again of the low back. The patient wants to have opiate pain medications, but he has a history of polysubstance abuse using marijuana and cocaine. The patient will be sent for another urine drug screen. He is on carbamazepine, and he will be sent for blood work for this as well. The patient will be given a prednisone Dosepak. He will followup in about 4 months. In the past, he has not responded well to the epidural steroid injections of the back.   Jill Alexanders MD 02/12/2014 7:51 PM  [facility] [address] [place], [place] [address]  Phone [phone] Fax [phone]

## 2014-02-13 ENCOUNTER — Other Ambulatory Visit: Payer: Self-pay | Admitting: Neurology

## 2014-02-13 DIAGNOSIS — R269 Unspecified abnormalities of gait and mobility: Secondary | ICD-10-CM

## 2014-02-13 LAB — CBC WITH DIFFERENTIAL
Basophils Absolute: 0 10*3/uL (ref 0.0–0.2)
Basos: 1 %
Eos: 4 %
Eosinophils Absolute: 0.2 10*3/uL (ref 0.0–0.4)
HCT: 43.4 % (ref 37.5–51.0)
Hemoglobin: 14.1 g/dL (ref 12.6–17.7)
Immature Grans (Abs): 0 10*3/uL (ref 0.0–0.1)
Immature Granulocytes: 0 %
Lymphocytes Absolute: 1.5 10*3/uL (ref 0.7–3.1)
Lymphs: 34 %
MCH: 25.3 pg — ABNORMAL LOW (ref 26.6–33.0)
MCHC: 32.5 g/dL (ref 31.5–35.7)
MCV: 78 fL — ABNORMAL LOW (ref 79–97)
Monocytes Absolute: 0.4 10*3/uL (ref 0.1–0.9)
Monocytes: 8 %
Neutrophils Absolute: 2.3 10*3/uL (ref 1.4–7.0)
Neutrophils Relative %: 53 %
Platelets: 235 10*3/uL (ref 150–379)
RBC: 5.57 x10E6/uL (ref 4.14–5.80)
RDW: 16.7 % — ABNORMAL HIGH (ref 12.3–15.4)
WBC: 4.4 10*3/uL (ref 3.4–10.8)

## 2014-02-13 LAB — COMPREHENSIVE METABOLIC PANEL
ALT: 25 IU/L (ref 0–44)
AST: 22 IU/L (ref 0–40)
Albumin/Globulin Ratio: 1.4 (ref 1.1–2.5)
Albumin: 4.2 g/dL (ref 3.5–5.5)
Alkaline Phosphatase: 83 IU/L (ref 39–117)
BUN/Creatinine Ratio: 11 (ref 9–20)
BUN: 14 mg/dL (ref 6–24)
CO2: 22 mmol/L (ref 18–29)
Calcium: 9.7 mg/dL (ref 8.7–10.2)
Chloride: 101 mmol/L (ref 97–108)
Creatinine, Ser: 1.22 mg/dL (ref 0.76–1.27)
GFR calc Af Amer: 78 mL/min/{1.73_m2} (ref >59)
GFR calc non Af Amer: 68 mL/min/{1.73_m2} (ref >59)
Globulin, Total: 3.1 g/dL (ref 1.5–4.5)
Glucose: 88 mg/dL (ref 65–99)
Potassium: 4.4 mmol/L (ref 3.5–5.2)
Sodium: 140 mmol/L (ref 134–144)
Total Bilirubin: <0.2 mg/dL (ref 0.0–1.2)
Total Protein: 7.3 g/dL (ref 6.0–8.5)

## 2014-02-13 LAB — 733690 12+OXYCODONE+CRT-SCR
Amphetamine Screen, Ur: NEGATIVE ng/mL
Barbiturate Screen, Ur: NEGATIVE ng/mL
Benzodiazepine Screen, Urine: POSITIVE ng/mL
Cannabinoids Ur Ql Scn: POSITIVE ng/mL
Cocaine(Metab.)Screen, Urine: NEGATIVE ng/mL
Creatinine(Crt), U: 182.6 mg/dL (ref 20.0–300.0)
Fentanyl+Norfentanyl Ur Ql Scn: NEGATIVE pg/mL
Meperidine Ur Ql: NEGATIVE ng/mL
Methadone Scn, Ur: NEGATIVE ng/mL
Opiate Scrn, Ur: NEGATIVE ng/mL
Oxycodone+Oxymorphone Ur Ql Scn: POSITIVE ng/mL
PCP Scrn, Ur: NEGATIVE ng/mL
Ph of Urine: 5.4 (ref 4.5–8.9)
Propoxyphene, Screen: NEGATIVE ng/mL
Tramadol Ur Ql Scn: NEGATIVE ng/mL

## 2014-02-13 LAB — LYME, TOTAL AB TEST/REFLEX: Lyme IgG/IgM Ab: <0.91 {ISR} (ref 0.00–0.90)

## 2014-02-13 LAB — HIV ANTIBODY (ROUTINE TESTING W REFLEX)
HIV 1/O/2 Abs-Index Value: <1 (ref <1.00)
HIV-1/HIV-2 Ab: NONREACTIVE

## 2014-02-13 LAB — ANGIOTENSIN CONVERTING ENZYME: Angio Convert Enzyme: 32 U/L (ref 14–82)

## 2014-02-13 LAB — CARBAMAZEPINE LEVEL, TOTAL: Carbamazepine Lvl: 2.3 ug/mL — ABNORMAL LOW (ref 4.0–12.0)

## 2014-02-13 LAB — VITAMIN B12: Vitamin B-12: 240 pg/mL (ref 211–946)

## 2014-02-13 LAB — ANA W/REFLEX: Anti Nuclear Antibody(ANA): NEGATIVE

## 2014-02-13 NOTE — Progress Notes (Signed)
Pt walked in to the office.  Asking for pain medication.  I relayed that Dr. Jannifer Franklin prescribed prednisone for his pain.  Awaiting lab result (urine drug screen).  Per Dr. Jannifer Franklin will use prednisone and non narcotics for his pain.  No narcotics.  Pt requested cane (dme prescription).  Done.  Given to pt.

## 2014-02-18 ENCOUNTER — Telehealth: Payer: Self-pay | Admitting: Neurology

## 2014-02-18 MED ORDER — GABAPENTIN 600 MG PO TABS: 600.0000 mg | ORAL_TABLET | Freq: Three times a day (TID) | ORAL | Status: DC

## 2014-02-18 NOTE — Telephone Encounter (Signed)
I called patient. The patient did not respond to the prednisone. His urine drug screen was positive for THC, and in the past with positive for cocaine. We will not give him controlled substances, I'll increase his gabapentin from 400 mg 3 times daily 600 mg 3 times daily dose.

## 2014-02-18 NOTE — Telephone Encounter (Signed)
Patient called and stated Steroid Injections were not beneficial, still in severe pain.  Please call anytime and advise, can leave message on vm if not available.  Thanks

## 2014-02-18 NOTE — Telephone Encounter (Signed)
FYI: patient reports steroid injection was not beneficial. He wants to know what else can be done. Please call.

## 2014-02-20 ENCOUNTER — Emergency Department (HOSPITAL_COMMUNITY)
Admission: EM | Admit: 2014-02-20 | Discharge: 2014-02-20 | Disposition: A | Discharge status: Home / Self Care | Payer: Medicaid Other | Attending: Emergency Medicine | Admitting: Emergency Medicine

## 2014-02-20 ENCOUNTER — Encounter (HOSPITAL_COMMUNITY): Payer: Self-pay | Admitting: Emergency Medicine

## 2014-02-20 DIAGNOSIS — Z79899 Other long term (current) drug therapy: Secondary | ICD-10-CM | POA: Diagnosis not present

## 2014-02-20 DIAGNOSIS — M5430 Sciatica, unspecified side: Secondary | ICD-10-CM | POA: Insufficient documentation

## 2014-02-20 DIAGNOSIS — IMO0001 Reserved for inherently not codable concepts without codable children: Secondary | ICD-10-CM | POA: Diagnosis not present

## 2014-02-20 DIAGNOSIS — R109 Unspecified abdominal pain: Secondary | ICD-10-CM | POA: Diagnosis not present

## 2014-02-20 DIAGNOSIS — F17200 Nicotine dependence, unspecified, uncomplicated: Secondary | ICD-10-CM | POA: Insufficient documentation

## 2014-02-20 DIAGNOSIS — G8929 Other chronic pain: Secondary | ICD-10-CM | POA: Diagnosis not present

## 2014-02-20 DIAGNOSIS — I10 Essential (primary) hypertension: Secondary | ICD-10-CM | POA: Diagnosis not present

## 2014-02-20 DIAGNOSIS — Z8659 Personal history of other mental and behavioral disorders: Secondary | ICD-10-CM | POA: Insufficient documentation

## 2014-02-20 DIAGNOSIS — M549 Dorsalgia, unspecified: Secondary | ICD-10-CM

## 2014-02-20 DIAGNOSIS — M5431 Sciatica, right side: Secondary | ICD-10-CM

## 2014-02-20 DIAGNOSIS — IMO0002 Reserved for concepts with insufficient information to code with codable children: Secondary | ICD-10-CM | POA: Insufficient documentation

## 2014-02-20 MED ORDER — HYDROMORPHONE HCL PF 2 MG/ML IJ SOLN
2.0000 mg | Freq: Once | INTRAMUSCULAR | Status: AC
  Administered 2014-02-20: 2 mg via INTRAMUSCULAR
  Filled 2014-02-20: qty 1

## 2014-02-20 MED ORDER — OXYCODONE-ACETAMINOPHEN 5-325 MG PO TABS
2.0000 | ORAL_TABLET | Freq: Once | ORAL | Status: DC
  Administered 2014-02-20: 2 via ORAL
  Filled 2014-02-20: qty 2

## 2014-02-20 MED ORDER — OMEPRAZOLE 20 MG PO CPDR: 20.0000 mg | DELAYED_RELEASE_CAPSULE | Freq: Every day | ORAL | Status: DC

## 2014-02-20 MED ORDER — ONDANSETRON 8 MG PO TBDP
8.0000 mg | ORAL_TABLET | Freq: Once | ORAL | Status: AC
  Administered 2014-02-20: 8 mg via ORAL
  Filled 2014-02-20: qty 1

## 2014-02-20 MED ORDER — GI COCKTAIL ~~LOC~~
30.0000 mL | Freq: Once | ORAL | Status: AC
  Administered 2014-02-20: 30 mL via ORAL
  Filled 2014-02-20: qty 30

## 2014-02-20 NOTE — ED Notes (Signed)
PA at bedside.

## 2014-02-20 NOTE — Telephone Encounter (Signed)
Noted  

## 2014-02-20 NOTE — ED Provider Notes (Signed)
Patient is a 53 year old male who initially presented to the emergency department for evaluation of back pain. The ongoing for the past 3 days. It is a stabbing pain. No red flag including bowel or bladder incontinence, IV drug abuse, history of cancer. Patient tender to palpation over low back. Patient is intermittently shaking his right leg. He states he's been seen by the nurse specialist for this and cannot figure out what is wrong.  Patient reports that he has a sharp right upper quadrant abdominal pain that began during his stay in the emergency department. He describes the pain as severe. He has associated diarrhea. He has history of ulcer disease. Patient is tender to palpation and right upper quadrant and epigastric area. He is voluntary guarding without rigidity. Will move to the acute side for evaluation with labs.  Filed Vitals:   02/20/14 1751  BP: 131/92  Pulse: 64  Temp: 98.8 F (37.1 C)  TempSrc: Oral  Resp: 16  SpO2: 100%     Elwyn Lade, PA-C 02/20/14 1849

## 2014-02-20 NOTE — Discharge Instructions (Signed)
Abdominal Pain °Many things can cause abdominal pain. Usually, abdominal pain is not caused by a disease and will improve without treatment. It can often be observed and treated at home. Your health care provider will do a physical exam and possibly order blood tests and X-rays to help determine the seriousness of your pain. However, in many cases, more time must pass before a clear cause of the pain can be found. Before that point, your health care provider may not know if you need more testing or further treatment. °HOME CARE INSTRUCTIONS  °Monitor your abdominal pain for any changes. The following actions may help to alleviate any discomfort you are experiencing: °· Only take over-the-counter or prescription medicines as directed by your health care provider. °· Do not take laxatives unless directed to do so by your health care provider. °· Try a clear liquid diet (broth, tea, or water) as directed by your health care provider. Slowly move to a bland diet as tolerated. °SEEK MEDICAL CARE IF: °· You have unexplained abdominal pain. °· You have abdominal pain associated with nausea or diarrhea. °· You have pain when you urinate or have a bowel movement. °· You experience abdominal pain that wakes you in the night. °· You have abdominal pain that is worsened or improved by eating food. °· You have abdominal pain that is worsened with eating fatty foods. °· You have a fever. °SEEK IMMEDIATE MEDICAL CARE IF:  °· Your pain does not go away within 2 hours. °· You keep throwing up (vomiting). °· Your pain is felt only in portions of the abdomen, such as the right side or the left lower portion of the abdomen. °· You pass bloody or black tarry stools. °MAKE SURE YOU: °· Understand these instructions.   °· Will watch your condition.   °· Will get help right away if you are not doing well or get worse.   °Document Released: 04/13/2005 Document Revised: 07/09/2013 Document Reviewed: 03/13/2013 °ExitCare® Patient Information  ©2015 [employer]. This information is not intended to replace advice given to you by your health care provider. Make sure you discuss any questions you have with your health care provider. °Back Pain, Adult °Low back pain is very common. About 1 in 5 people have back pain. The cause of low back pain is rarely dangerous. The pain often gets better over time. About half of people with a sudden onset of back pain feel better in just 2 weeks. About 8 in 10 people feel better by 6 weeks.  °CAUSES °Some common causes of back pain include: °· Strain of the muscles or ligaments supporting the spine. °· Wear and tear (degeneration) of the spinal discs. °· Arthritis. °· Direct injury to the back. °DIAGNOSIS °Most of the time, the direct cause of low back pain is not known. However, back pain can be treated effectively even when the exact cause of the pain is unknown. Answering your caregiver's questions about your overall health and symptoms is one of the most accurate ways to make sure the cause of your pain is not dangerous. If your caregiver needs more information, he or she may order lab work or imaging tests (X-rays or MRIs). However, even if imaging tests show changes in your back, this usually does not require surgery. °HOME CARE INSTRUCTIONS °For many people, back pain returns. Since low back pain is rarely dangerous, it is often a condition that people can learn to manage on their own.  °· Remain active. It is stressful on the back to sit or stand in one   place. Do not sit, drive, or stand in one place for more than 30 minutes at a time. Take short walks on level surfaces as soon as pain allows. Try to increase the length of time you walk each day. °· Do not stay in bed. Resting more than 1 or 2 days can delay your recovery. °· Do not avoid exercise or work. Your body is made to move. It is not dangerous to be active, even though your back may hurt. Your back will likely heal faster if you return to being active  before your pain is gone. °· Pay attention to your body when you  bend and lift. Many people have less discomfort when lifting if they bend their knees, keep the load close to their bodies, and avoid twisting. Often, the most comfortable positions are those that put less stress on your recovering back. °· Find a comfortable position to sleep. Use a firm mattress and lie on your side with your knees slightly bent. If you lie on your back, put a pillow under your knees. °· Only take over-the-counter or prescription medicines as directed by your caregiver. Over-the-counter medicines to reduce pain and inflammation are often the most helpful. Your caregiver may prescribe muscle relaxant drugs. These medicines help dull your pain so you can more quickly return to your normal activities and healthy exercise. °· Put ice on the injured area. °¨ Put ice in a plastic bag. °¨ Place a towel between your skin and the bag. °¨ Leave the ice on for 15-20 minutes, 03-04 times a day for the first 2 to 3 days. After that, ice and heat may be alternated to reduce pain and spasms. °· Ask your caregiver about trying back exercises and gentle massage. This may be of some benefit. °· Avoid feeling anxious or stressed. Stress increases muscle tension and can worsen back pain. It is important to recognize when you are anxious or stressed and learn ways to manage it. Exercise is a great option. °SEEK MEDICAL CARE IF: °· You have pain that is not relieved with rest or medicine. °· You have pain that does not improve in 1 week. °· You have new symptoms. °· You are generally not feeling well. °SEEK IMMEDIATE MEDICAL CARE IF:  °· You have pain that radiates from your back into your legs. °· You develop new bowel or bladder control problems. °· You have unusual weakness or numbness in your arms or legs. °· You develop nausea or vomiting. °· You develop abdominal pain. °· You feel faint. °Document Released: 07/04/2005 Document Revised: 01/03/2012  Document Reviewed: 11/05/2013 °ExitCare® Patient Information ©2015 [employer]. This information is not intended to replace advice given to you by your health care provider. Make sure you discuss any questions you have with your health care provider. ° °

## 2014-02-20 NOTE — ED Provider Notes (Signed)
CSN: [id number]     Arrival date & time 02/20/14  1726 History   First MD Initiated Contact with Patient 02/20/14 1830     Chief Complaint  Patient presents with  . Back Pain  . Abdominal Pain     (Consider location/radiation/quality/duration/timing/severity/associated sxs/prior Treatment) HPI Comments: Patient presents to the emergency department with chief complaint of back pain and abdominal pain. Patient states that he has chronic back pain. He reports associated right-sided sciatica. He is being seen by neurosurgery in [place]. He reports having a scheduled MRI for this Saturday. He denies any lower extremity weakness, pallor bladder incontinence, saddle anesthesia, or fevers. He states that he is here because his pain is worsened. He states that the Percocet he is taking no longer helps.  Additionally, he complains of having a stomach ulcer. He complains of epigastric abdominal pain. He states that this is been an ongoing problem. He states that it gets worse when he has stress. He denies any fevers, chills, nausea, or vomiting.  The history is provided by the patient. No language interpreter was used.    Past Medical History  Diagnosis Date  . Chronic back pain   . Hypertension   . Depression   . Small vessel disease     Right basal ganglia stroke  . History of alcohol abuse   . Sciatica neuralgia    Past Surgical History  Procedure Laterality Date  . Intercostal nerve block  2005  . Surgery back of head  1996-97   Family History  Problem Relation Age of Onset  . Heart disease Father    History  Substance Use Topics  . Smoking status: Current Some Day Smoker -- 0.30 packs/day    Types: Cigarettes  . Smokeless tobacco: Never Used  . Alcohol Use: No     Comment: History of alcohol abuse, marijuana use, cocaine use    Review of Systems  Constitutional: Negative for fever and chills.  Gastrointestinal: Positive for abdominal pain.       No bowel incontinence   Genitourinary:       No urinary incontinence  Musculoskeletal: Positive for arthralgias, back pain and myalgias.  Neurological:       No saddle anesthesia  All other systems reviewed and are negative.     Allergies  Lisinopril  Home Medications   Prior to Admission medications   Medication Sig Start Date End Date Taking? Authorizing Provider  atenolol (TENORMIN) 50 MG tablet Take 1 tablet (50 mg total) by mouth daily. 02/03/14  Yes Wandra Arthurs, MD  carbamazepine (TEGRETOL) 200 MG tablet Take 200 mg by mouth 2 (two) times daily. 07/05/13  Yes Kathrynn Ducking, MD  cyclobenzaprine (FLEXERIL) 10 MG tablet Take 1 tablet (10 mg total) by mouth 3 (three) times daily as needed for muscle spasms. 10/04/13  Yes Reyne Dumas, MD  diazepam (VALIUM) 5 MG tablet Take 1 tablet (5 mg total) by mouth every 8 (eight) hours as needed for muscle spasms (spasms). 02/03/14  Yes Wandra Arthurs, MD  fluticasone (FLONASE) 50 MCG/ACT nasal spray Place 2 sprays into both nostrils daily. 11/25/13  Yes Lorayne Marek, MD  gabapentin (NEURONTIN) 600 MG tablet Take 1 tablet (600 mg total) by mouth 3 (three) times daily. 02/18/14  Yes Kathrynn Ducking, MD   BP 131/92  Pulse 64  Temp(Src) 98.8 F (37.1 C) (Oral)  Resp 16  SpO2 100% Physical Exam  Nursing note and vitals reviewed. Constitutional: He is oriented to person,  place, and time. He appears well-developed and well-nourished. No distress.  HENT:  Head: Normocephalic and atraumatic.  Eyes: Conjunctivae and EOM are normal. Right eye exhibits no discharge. Left eye exhibits no discharge. No scleral icterus.  Neck: Normal range of motion. Neck supple. No tracheal deviation present.  Cardiovascular: Normal rate, regular rhythm and normal heart sounds.  Exam reveals no gallop and no friction rub.   No murmur heard. Pulmonary/Chest: Effort normal and breath sounds normal. No respiratory distress. He has no wheezes.  Abdominal: Soft. He exhibits no distension. There  is no tenderness.  No focal abdominal tenderness, but patient complains of pain in the epigastric region  Musculoskeletal: Normal range of motion.  Lumbar paraspinal muscles tender to palpation, no bony tenderness, step-offs, or gross abnormality or deformity of spine, patient is able to ambulate, moves all extremities  Bilateral great toe extension intact Bilateral plantar/dorsiflexion intact  Neurological: He is alert and oriented to person, place, and time. He has normal reflexes.  Sensation and strength intact bilaterally Symmetrical reflexes  Skin: Skin is warm. He is not diaphoretic.  Psychiatric: He has a normal mood and affect. His behavior is normal. Judgment and thought content normal.    ED Course  Procedures (including critical care time) Labs Review Labs Reviewed  CBC WITH DIFFERENTIAL  COMPREHENSIVE METABOLIC PANEL  LIPASE, BLOOD    Imaging Review No results found.   EKG Interpretation None      MDM   Final diagnoses:  Chronic back pain  Sciatica, right   Patient comes to ED with CC of back pain that does not improve with pain medicine.   Patient with back pain.  No neurological deficits and normal neuro exam.  Patient is ambulatory.  No loss of bowel or bladder control.  Doubt cauda equina.  Denies fever,  doubt epidural abscess or other lesion. Recommend back exercises, stretching.  Patient has Percocet at home. He also takes Valium. I do not feel that additional pain medicine is indicated at this time. He has appropriate followup with neurosurgery and primary care.  Suspect the patient's upper abdominal pain is related to stress ulcer.  He states that it is not new, and he has a history of ulcers.  I will treat him with omeprazole and recommend follow-up with PCP.  Encouraged the patient that there could be a need for additional workup and/or imaging such as MRI, if the symptoms do not resolve. Patient advised that if the back pain does not resolve, or  radiates, this could progress to more serious conditions and is encouraged to follow-up with PCP or orthopedics within 2 weeks.    Patient refuses laboratory workup.   Pain improved in the ED.     Montine Circle, PA-C 02/20/14 2025

## 2014-02-20 NOTE — ED Notes (Signed)
Pt reports radiating pain down left leg that involves the leg to the toes and around in the groin with pain 10/10. Pt denies any bowel or bladder problems. Pt has hx of back pain but reports that this is worse and usually goes down his right leg and not his left.

## 2014-02-20 NOTE — ED Notes (Signed)
Pt refused lab work.  PA Rob aware.

## 2014-02-21 NOTE — ED Provider Notes (Signed)
Medical screening examination/treatment/procedure(s) were performed by non-physician practitioner and as supervising physician I was immediately available for consultation/collaboration.  Ernestina Patches, MD 02/21/14 [phone]

## 2014-02-21 NOTE — ED Provider Notes (Signed)
Medical screening examination/treatment/procedure(s) were performed by non-physician practitioner and as supervising physician I was immediately available for consultation/collaboration.  Ernestina Patches, MD 02/21/14 [phone]

## 2014-02-22 ENCOUNTER — Ambulatory Visit
Admission: RE | Admit: 2014-02-22 | Discharge: 2014-02-22 | Disposition: A | Discharge status: Home / Self Care | Payer: Medicaid Other | Source: Ambulatory Visit | Attending: Neurology | Admitting: Neurology

## 2014-02-22 DIAGNOSIS — G8929 Other chronic pain: Secondary | ICD-10-CM

## 2014-02-22 DIAGNOSIS — M5430 Sciatica, unspecified side: Secondary | ICD-10-CM

## 2014-02-22 DIAGNOSIS — M549 Dorsalgia, unspecified: Secondary | ICD-10-CM

## 2014-02-22 DIAGNOSIS — M545 Low back pain, unspecified: Secondary | ICD-10-CM

## 2014-03-04 ENCOUNTER — Telehealth: Payer: Self-pay | Admitting: Neurology

## 2014-03-04 NOTE — Telephone Encounter (Signed)
MRI of the low back shows no change from one year ago, I will communicate the results through my chart, no indication for surgery.    MRI lumbar 02/23/2014:  Impression   Abnormal MRI scan of the lumbar spine showing mild disc  protrusions at L3-4 and L4-5 and by foraminal narrowing but without  definite compression. Overall no significant change compared with previous  MRI scan dated 02/21/2013.

## 2014-03-06 ENCOUNTER — Telehealth: Payer: Self-pay | Admitting: Neurology

## 2014-03-06 NOTE — Telephone Encounter (Signed)
Patient's fiancee calling to request a call back with patient's MRI and bloodwork results. Please return call and advise.

## 2014-03-06 NOTE — Telephone Encounter (Signed)
Jason Stokes was in the office on 7/31. He signed a new DPR and did not add anyone to share his information with. Unless he comes by the office and adds [name] we will not be able to share results.

## 2014-03-10 ENCOUNTER — Telehealth: Payer: Self-pay | Admitting: Neurology

## 2014-03-10 NOTE — Telephone Encounter (Signed)
Patient called for lab and MRI results. Patient was informed that there was no change in MRI from last year. Therefore, no indication for surgery. He was also notified of THC in urine drug screen and that no more pain meds will be prescribed. His Gabapentin has been increased from 400mg  to 600 mg three times daily. Patient verbalizes understanding.

## 2014-03-10 NOTE — Telephone Encounter (Signed)
Patient is calling to get lab and MRI results--please call.

## 2014-03-26 ENCOUNTER — Telehealth: Payer: Self-pay | Admitting: *Deleted

## 2014-03-26 ENCOUNTER — Telehealth: Payer: Self-pay | Admitting: Neurology

## 2014-03-26 NOTE — Telephone Encounter (Signed)
Patient requesting sooner appointment than scheduled in December due to pain and numbness on his left side. Best number to call is [phone].

## 2014-03-26 NOTE — Telephone Encounter (Signed)
Per Dr. Jannifer Franklin patient may be seen my NP in 2-3 weeks. Left message.

## 2014-03-26 NOTE — Telephone Encounter (Signed)
I called patient, left message. The patient wants an earlier appointment, I'll try to get something set up. The patient apparently has had a positive drug screen for THC. He will not be given prescriptions for controlled substances, and epidural steroid injections in the past couple. There are clear nonorganic features to his clinical examination. The patient may benefit from nonmedical therapy such as a TENS unit.

## 2014-03-26 NOTE — Telephone Encounter (Signed)
Not sure what to do?  The patient was informed of positive drug screen and last MRI results several weeks ago. He has called today requesting a sooner appointment due to left side pain. Best number to call is [phone].

## 2014-04-06 ENCOUNTER — Emergency Department (HOSPITAL_COMMUNITY)
Admission: EM | Admit: 2014-04-06 | Discharge: 2014-04-06 | Disposition: A | Discharge status: Home / Self Care | Payer: Medicaid Other | Attending: Emergency Medicine | Admitting: Emergency Medicine

## 2014-04-06 ENCOUNTER — Encounter (HOSPITAL_COMMUNITY): Payer: Self-pay | Admitting: Emergency Medicine

## 2014-04-06 DIAGNOSIS — G8929 Other chronic pain: Secondary | ICD-10-CM | POA: Diagnosis not present

## 2014-04-06 DIAGNOSIS — M545 Low back pain, unspecified: Secondary | ICD-10-CM | POA: Insufficient documentation

## 2014-04-06 DIAGNOSIS — Z8659 Personal history of other mental and behavioral disorders: Secondary | ICD-10-CM | POA: Insufficient documentation

## 2014-04-06 DIAGNOSIS — Z79899 Other long term (current) drug therapy: Secondary | ICD-10-CM | POA: Insufficient documentation

## 2014-04-06 DIAGNOSIS — I10 Essential (primary) hypertension: Secondary | ICD-10-CM | POA: Insufficient documentation

## 2014-04-06 DIAGNOSIS — IMO0002 Reserved for concepts with insufficient information to code with codable children: Secondary | ICD-10-CM | POA: Diagnosis not present

## 2014-04-06 DIAGNOSIS — F17200 Nicotine dependence, unspecified, uncomplicated: Secondary | ICD-10-CM | POA: Insufficient documentation

## 2014-04-06 MED ORDER — METHOCARBAMOL 500 MG PO TABS: 1000.0000 mg | ORAL_TABLET | Freq: Four times a day (QID) | ORAL | Status: DC

## 2014-04-06 MED ORDER — OXYCODONE-ACETAMINOPHEN 5-325 MG PO TABS: 1.0000 | ORAL_TABLET | Freq: Four times a day (QID) | ORAL | Status: DC | PRN

## 2014-04-06 MED ORDER — OXYCODONE-ACETAMINOPHEN 5-325 MG PO TABS
1.0000 | ORAL_TABLET | Freq: Once | ORAL | Status: AC
  Administered 2014-04-06: 1 via ORAL
  Filled 2014-04-06: qty 1

## 2014-04-06 NOTE — ED Notes (Signed)
Pt states that he has hx of "severe back problems".  Pt states that he fell yesterday because his leg gave out.  Pt states that he is having low back pain.  "It feels like my nerves done got pinched".

## 2014-04-06 NOTE — Discharge Instructions (Signed)
Please read and follow all provided instructions.  Your diagnoses today include:  1. Chronic low back pain     Tests performed today include:  Vital signs - see below for your results today  Medications prescribed:   Percocet (oxycodone/acetaminophen) - narcotic pain medication  DO NOT drive or perform any activities that require you to be awake and alert because this medicine can make you drowsy. BE VERY CAREFUL not to take multiple medicines containing Tylenol (also called acetaminophen). Doing so can lead to an overdose which can damage your liver and cause liver failure and possibly death.   Robaxin (methocarbamol) - muscle relaxer medication  DO NOT drive or perform any activities that require you to be awake and alert because this medicine can make you drowsy.   Take any prescribed medications only as directed.  Home care instructions:   Follow any educational materials contained in this packet  Please rest, use ice or heat on your back for the next several days  Do not lift, push, pull anything more than 10 pounds for the next week  Follow-up instructions: Please follow-up with your primary care provider or back pain doctor in the next 1 week for further evaluation of your symptoms.   Return instructions:  SEEK IMMEDIATE MEDICAL ATTENTION IF YOU HAVE:  New numbness, tingling, weakness, or problem with the use of your arms or legs  Severe back pain not relieved with medications  Loss control of your bowels or bladder  Increasing pain in any areas of the body (such as chest or abdominal pain)  Shortness of breath, dizziness, or fainting.   Worsening nausea (feeling sick to your stomach), vomiting, fever, or sweats  Any other emergent concerns regarding your health   Additional Information:  Your vital signs today were: BP 146/98   Pulse 62   Temp(Src) 98.1 F (36.7 C) (Oral)   Resp 18   SpO2 100% If your blood pressure (BP) was elevated above 135/85 this  visit, please have this repeated by your doctor within one month. --------------

## 2014-04-06 NOTE — ED Provider Notes (Signed)
CSN: [id number]     Arrival date & time 04/06/14  4098 History   First MD Initiated Contact with Patient 04/06/14 [phone]     Chief Complaint  Patient presents with  . Fall  . Back Pain     (Consider location/radiation/quality/duration/timing/severity/associated sxs/prior Treatment) HPI Comments: Patient with h/o lumbar radiculopathy, recent MRI 08/15 stable compared to MRI 08/14 -- presents with worsening of lower back pain with pain radiating into his bilateral lower legs. Patient states that he was at his baseline yesterday and was walking on his left leg "gave out". Patient fell to the ground and had worsening of his pain. Patient has had the same pain in the past. He took neurontin without improvement in symptoms. He is able to ambulate but states that it is painful to do so. Patient denies warning symptoms of back pain including: fecal incontinence, urinary retention or overflow incontinence, night sweats, waking from sleep with back pain, unexplained fevers or weight loss, h/o cancer, IVDU, recent trauma. Patient had injection in back sometime between July and August. He is going to follow up with his neurologist in December.   Patient is a 53 y.o. male presenting with fall and back pain. The history is provided by the patient and medical records.  Fall Pertinent negatives include no fever, numbness or weakness.  Back Pain Associated symptoms: no fever, no numbness and no weakness     Past Medical History  Diagnosis Date  . Chronic back pain   . Hypertension   . Depression   . Small vessel disease     Right basal ganglia stroke  . History of alcohol abuse   . Sciatica neuralgia    Past Surgical History  Procedure Laterality Date  . Intercostal nerve block  2005  . Surgery back of head  1996-97   Family History  Problem Relation Age of Onset  . Heart disease Father    History  Substance Use Topics  . Smoking status: Current Some Day Smoker -- 0.30 packs/day    Types:  Cigarettes  . Smokeless tobacco: Never Used  . Alcohol Use: No     Comment: History of alcohol abuse, marijuana use, cocaine use    Review of Systems  Constitutional: Negative for fever and unexpected weight change.  Gastrointestinal: Negative for constipation.       Neg for fecal incontinence  Genitourinary: Negative for hematuria, flank pain and difficulty urinating.       Negative for urinary incontinence or retention  Musculoskeletal: Positive for back pain.  Neurological: Negative for weakness and numbness.       Negative for saddle paresthesias       Allergies  Lisinopril  Home Medications   Prior to Admission medications   Medication Sig Start Date End Date Taking? Authorizing Provider  atenolol (TENORMIN) 50 MG tablet Take 1 tablet (50 mg total) by mouth daily. 02/03/14   Wandra Arthurs, MD  carbamazepine (TEGRETOL) 200 MG tablet Take 200 mg by mouth 2 (two) times daily. 07/05/13   Kathrynn Ducking, MD  cyclobenzaprine (FLEXERIL) 10 MG tablet Take 1 tablet (10 mg total) by mouth 3 (three) times daily as needed for muscle spasms. 10/04/13   Reyne Dumas, MD  diazepam (VALIUM) 5 MG tablet Take 1 tablet (5 mg total) by mouth every 8 (eight) hours as needed for muscle spasms (spasms). 02/03/14   Wandra Arthurs, MD  fluticasone (FLONASE) 50 MCG/ACT nasal spray Place 2 sprays into both nostrils daily. 11/25/13  Lorayne Marek, MD  gabapentin (NEURONTIN) 600 MG tablet Take 1 tablet (600 mg total) by mouth 3 (three) times daily. 02/18/14   Kathrynn Ducking, MD  omeprazole (PRILOSEC) 20 MG capsule Take 1 capsule (20 mg total) by mouth daily. 02/20/14   Montine Circle, PA-C   BP 146/98  Pulse 62  Temp(Src) 98.1 F (36.7 C) (Oral)  Resp 18  SpO2 100%  Physical Exam  Nursing note and vitals reviewed. Constitutional: He appears well-developed and well-nourished.  HENT:  Head: Normocephalic and atraumatic.  Eyes: Conjunctivae are normal.  Neck: Normal range of motion.  Abdominal: Soft.  There is no tenderness. There is no CVA tenderness.  Musculoskeletal: Normal range of motion.       Right hip: Normal.       Left hip: Normal.       Cervical back: He exhibits normal range of motion, no tenderness and no bony tenderness.       Thoracic back: He exhibits normal range of motion, no tenderness and no bony tenderness.       Lumbar back: He exhibits tenderness. He exhibits no bony tenderness.       Back:  No step-off noted with palpation of spine. No tenderness to percussion of spinous processes.   Neurological: He is alert. He has normal reflexes. No sensory deficit. He exhibits normal muscle tone.  5/5 strength in entire lower extremities bilaterally. No sensation deficit.   Skin: Skin is warm and dry.  Psychiatric: He has a normal mood and affect.    ED Course  Procedures (including critical care time) Labs Review Labs Reviewed - No data to display  Imaging Review No results found.   EKG Interpretation None      9:43 AM Patient seen and examined. Medications ordered.   Vital signs reviewed and are as follows: BP 146/98  Pulse 62  Temp(Src) 98.1 F (36.7 C) (Oral)  Resp 18  SpO2 100%  No red flag s/s of low back pain. Patient was counseled on back pain precautions and told to do activity as tolerated but do not lift, push, or pull heavy objects more than 10 pounds for the next week.  Patient counseled to use ice or heat on back for no longer than 15 minutes every hour.   Patient prescribed muscle relaxer and counseled on proper use of muscle relaxant medication.    Patient prescribed narcotic pain medicine and counseled on proper use of narcotic pain medications. Counseled not to combine this medication with others containing tylenol.   Urged patient not to drink alcohol, drive, or perform any other activities that requires focus while taking either of these medications.  Patient urged to follow-up with PCP if pain does not improve with treatment and rest  or if pain becomes recurrent. Urged to return with worsening severe pain, loss of bowel or bladder control, trouble walking.   The patient verbalizes understanding and agrees with the plan.   MDM   Final diagnoses:  Chronic low back pain   Patient with chronic back pain. Pervious MRI findings and neurology notes reviewed. No new neurological deficits. Patient is ambulatory. No warning symptoms of back pain including: fecal incontinence, urinary retention or overflow incontinence, night sweats, waking from sleep with back pain, unexplained fevers or weight loss, h/o cancer, IVDU, recent trauma. No concern for cauda equina, epidural abscess, or other serious cause of back pain. As this flare is same as previous do not feel advanced imaging is indicated at this  time. Conservative measures such as rest, ice/heat and pain medicine indicated with PCP/neuro follow-up if no improvement with conservative management.      Carlisle Cater, PA-C 04/06/14 1007

## 2014-04-07 NOTE — ED Provider Notes (Signed)
Medical screening examination/treatment/procedure(s) were performed by non-physician practitioner and as supervising physician I was immediately available for consultation/collaboration.   EKG Interpretation None        Ephraim Hamburger, MD 04/07/14 [phone]

## 2014-04-10 ENCOUNTER — Ambulatory Visit: Payer: Medicaid Other | Attending: Internal Medicine | Admitting: Internal Medicine

## 2014-04-10 ENCOUNTER — Encounter: Payer: Self-pay | Admitting: Internal Medicine

## 2014-04-10 VITALS — BP 140/86 | HR 86 | Temp 98.8°F | Resp 16 | Wt 250.0 lb

## 2014-04-10 DIAGNOSIS — Z23 Encounter for immunization: Secondary | ICD-10-CM | POA: Diagnosis not present

## 2014-04-10 DIAGNOSIS — I10 Essential (primary) hypertension: Secondary | ICD-10-CM | POA: Insufficient documentation

## 2014-04-10 DIAGNOSIS — Z79899 Other long term (current) drug therapy: Secondary | ICD-10-CM | POA: Insufficient documentation

## 2014-04-10 DIAGNOSIS — F17200 Nicotine dependence, unspecified, uncomplicated: Secondary | ICD-10-CM | POA: Insufficient documentation

## 2014-04-10 DIAGNOSIS — M5126 Other intervertebral disc displacement, lumbar region: Secondary | ICD-10-CM

## 2014-04-10 DIAGNOSIS — G894 Chronic pain syndrome: Secondary | ICD-10-CM | POA: Insufficient documentation

## 2014-04-10 DIAGNOSIS — M5136 Other intervertebral disc degeneration, lumbar region: Secondary | ICD-10-CM

## 2014-04-10 DIAGNOSIS — M519 Unspecified thoracic, thoracolumbar and lumbosacral intervertebral disc disorder: Secondary | ICD-10-CM | POA: Diagnosis not present

## 2014-04-10 MED ORDER — ACETAMINOPHEN-CODEINE #2 300-15 MG PO TABS: 1.0000 | ORAL_TABLET | Freq: Three times a day (TID) | ORAL | Status: DC | PRN

## 2014-04-10 NOTE — Patient Instructions (Signed)
DASH Eating Plan °DASH stands for "Dietary Approaches to Stop Hypertension." The DASH eating plan is a healthy eating plan that has been shown to reduce high blood pressure (hypertension). Additional health benefits may include reducing the risk of type 2 diabetes mellitus, heart disease, and stroke. The DASH eating plan may also help with weight loss. °WHAT DO I NEED TO KNOW ABOUT THE DASH EATING PLAN? °For the DASH eating plan, you will follow these general guidelines: °· Choose foods with a percent daily value for sodium of less than 5% (as listed on the food label). °· Use salt-free seasonings or herbs instead of table salt or sea salt. °· Check with your health care provider or pharmacist before using salt substitutes. °· Eat lower-sodium products, often labeled as "lower sodium" or "no salt added." °· Eat fresh foods. °· Eat more vegetables, fruits, and low-fat dairy products. °· Choose whole grains. Look for the word "whole" as the first word in the ingredient list. °· Choose fish and skinless chicken or turkey more often than red meat. Limit fish, poultry, and meat to 6 oz (170 g) each day. °· Limit sweets, desserts, sugars, and sugary drinks. °· Choose heart-healthy fats. °· Limit cheese to 1 oz (28 g) per day. °· Eat more home-cooked food and less restaurant, buffet, and fast food. °· Limit fried foods. °· Cook foods using methods other than frying. °· Limit canned vegetables. If you do use them, rinse them well to decrease the sodium. °· When eating at a restaurant, ask that your food be prepared with less salt, or no salt if possible. °WHAT FOODS CAN I EAT? °Seek help from a dietitian for individual calorie needs. °Grains °Whole grain or whole wheat bread. Brown rice. Whole grain or whole wheat pasta. Quinoa, bulgur, and whole grain cereals. Low-sodium cereals. Corn or whole wheat flour tortillas. Whole grain cornbread. Whole grain crackers. Low-sodium crackers. °Vegetables °Fresh or frozen vegetables  (raw, steamed, roasted, or grilled). Low-sodium or reduced-sodium tomato and vegetable juices. Low-sodium or reduced-sodium tomato sauce and paste. Low-sodium or reduced-sodium canned vegetables.  °Fruits °All fresh, canned (in natural juice), or frozen fruits. °Meat and Other Protein Products °Ground beef (85% or leaner), grass-fed beef, or beef trimmed of fat. Skinless chicken or turkey. Ground chicken or turkey. Pork trimmed of fat. All fish and seafood. Eggs. Dried beans, peas, or lentils. Unsalted nuts and seeds. Unsalted canned beans. °Dairy °Low-fat dairy products, such as skim or 1% milk, 2% or reduced-fat cheeses, low-fat ricotta or cottage cheese, or plain low-fat yogurt. Low-sodium or reduced-sodium cheeses. °Fats and Oils °Tub margarines without trans fats. Light or reduced-fat mayonnaise and salad dressings (reduced sodium). Avocado. Safflower, olive, or canola oils. Natural peanut or almond butter. °Other °Unsalted popcorn and pretzels. °The items listed above may not be a complete list of recommended foods or beverages. Contact your dietitian for more options. °WHAT FOODS ARE NOT RECOMMENDED? °Grains °White bread. White pasta. White rice. Refined cornbread. Bagels and croissants. Crackers that contain trans fat. °Vegetables °Creamed or fried vegetables. Vegetables in a cheese sauce. Regular canned vegetables. Regular canned tomato sauce and paste. Regular tomato and vegetable juices. °Fruits °Dried fruits. Canned fruit in light or heavy syrup. Fruit juice. °Meat and Other Protein Products °Fatty cuts of meat. Ribs, chicken wings, bacon, sausage, bologna, salami, chitterlings, fatback, hot dogs, bratwurst, and packaged luncheon meats. Salted nuts and seeds. Canned beans with salt. °Dairy °Whole or 2% milk, cream, half-and-half, and cream cheese. Whole-fat or sweetened yogurt. Full-fat   cheeses or blue cheese. Nondairy creamers and whipped toppings. Processed cheese, cheese spreads, or cheese  curds. °Condiments °Onion and garlic salt, seasoned salt, table salt, and sea salt. Canned and packaged gravies. Worcestershire sauce. Tartar sauce. Barbecue sauce. Teriyaki sauce. Soy sauce, including reduced sodium. Steak sauce. Fish sauce. Oyster sauce. Cocktail sauce. Horseradish. Ketchup and mustard. Meat flavorings and tenderizers. Bouillon cubes. Hot sauce. Tabasco sauce. Marinades. Taco seasonings. Relishes. °Fats and Oils °Butter, stick margarine, lard, shortening, ghee, and bacon fat. Coconut, palm kernel, or palm oils. Regular salad dressings. °Other °Pickles and olives. Salted popcorn and pretzels. °The items listed above may not be a complete list of foods and beverages to avoid. Contact your dietitian for more information. °WHERE CAN I FIND MORE INFORMATION? °[employer], [employer]: www.nhlbi.nih.gov/health/health-topics/topics/dash/ °Document Released: 06/23/2011 Document Revised: 11/18/2013 Document Reviewed: 05/08/2013 °[employer]® Patient Information ©2015 [employer]. This information is not intended to replace advice given to you by your health care provider. Make sure you discuss any questions you have with your health care provider. ° °

## 2014-04-10 NOTE — Progress Notes (Signed)
Patient here for his chronic back pain Requesting a referral to neuro surgeon

## 2014-04-10 NOTE — Progress Notes (Signed)
MRN: [id number] Name: Jason Stokes  Sex: male Age: 53 y.o. DOB: August 24, 1960  Allergies: Lisinopril  Chief Complaint  Patient presents with  . Back Pain    HPI: Patient is 53 y.o. male who has history of hypertension chronic back pain, patient recently went to the emergency room with a similar symptoms, EMR reviewed her patient was told to and given information for neurosurgeon, patient is requesting referral, used to follow with the neurologist as per patient he had MRI done which reported disc bulge but no compression is currently taking Neurontin muscle relaxant, patient is asking for more pain medication, in the past she has already been referred to pain management but patient did not make an appointment.  Past Medical History  Diagnosis Date  . Chronic back pain   . Hypertension   . Depression   . Small vessel disease     Right basal ganglia stroke  . History of alcohol abuse   . Sciatica neuralgia     Past Surgical History  Procedure Laterality Date  . Intercostal nerve block  2005  . Surgery back of head  1996-97      Medication List       This list is accurate as of: 04/10/14 11:53 AM.  Always use your most recent med list.               acetaminophen-codeine 300-15 MG per tablet  Commonly known as:  TYLENOL #2  Take 1 tablet by mouth every 8 (eight) hours as needed for moderate pain.     atenolol 50 MG tablet  Commonly known as:  TENORMIN  Take 1 tablet (50 mg total) by mouth daily.     carbamazepine 200 MG tablet  Commonly known as:  TEGRETOL  Take 200 mg by mouth 2 (two) times daily.     cyclobenzaprine 10 MG tablet  Commonly known as:  FLEXERIL  Take 1 tablet (10 mg total) by mouth 3 (three) times daily as needed for muscle spasms.     diazepam 5 MG tablet  Commonly known as:  VALIUM  Take 1 tablet (5 mg total) by mouth every 8 (eight) hours as needed for muscle spasms (spasms).     gabapentin 600 MG tablet  Commonly known as:  NEURONTIN    Take 1 tablet (600 mg total) by mouth 3 (three) times daily.     methocarbamol 500 MG tablet  Commonly known as:  ROBAXIN  Take 2 tablets (1,000 mg total) by mouth 4 (four) times daily.     omeprazole 20 MG capsule  Commonly known as:  PRILOSEC  Take 1 capsule (20 mg total) by mouth daily.     oxyCODONE-acetaminophen 5-325 MG per tablet  Commonly known as:  PERCOCET/ROXICET  Take 1-2 tablets by mouth every 6 (six) hours as needed for severe pain.        Meds ordered this encounter  Medications  . acetaminophen-codeine (TYLENOL #2) 300-15 MG per tablet    Sig: Take 1 tablet by mouth every 8 (eight) hours as needed for moderate pain.    Dispense:  40 tablet    Refill:  0    Immunization History  Administered Date(s) Administered  . Influenza Split 05/09/2013  . Influenza,inj,Quad PF,36+ Mos 04/10/2014    Family History  Problem Relation Age of Onset  . Heart disease Father     History  Substance Use Topics  . Smoking status: Current Some Day Smoker -- 0.30 packs/day  Types: Cigarettes  . Smokeless tobacco: Never Used  . Alcohol Use: No     Comment: History of alcohol abuse, marijuana use, cocaine use    Review of Systems   As noted in HPI  Filed Vitals:   04/10/14 1121  BP: 140/86  Pulse: 86  Temp: 98.8 F (37.1 C)  Resp: 16    Physical Exam  Physical Exam  Constitutional: No distress.  Cardiovascular: Normal rate and regular rhythm.   Pulmonary/Chest: Breath sounds normal. No respiratory distress. He has no wheezes. He has no rales.  Musculoskeletal:  Some lower spinal and paraspinal tenderness, equal strength both lower extremities    CBC    Component Value Date/Time   WBC 4.4 02/12/2014 1530   WBC 4.6 10/04/2013 1102   RBC 5.57 02/12/2014 1530   RBC 5.64 10/04/2013 1102   HGB 14.1 02/12/2014 1530   HCT 43.4 02/12/2014 1530   PLT 235 02/12/2014 1530   MCV 78* 02/12/2014 1530   LYMPHSABS 1.5 02/12/2014 1530   LYMPHSABS 1.3 10/04/2013 1102    MONOABS 0.4 10/04/2013 1102   EOSABS 0.2 02/12/2014 1530   EOSABS 0.1 10/04/2013 1102   BASOSABS 0.0 02/12/2014 1530   BASOSABS 0.0 10/04/2013 1102    CMP     Component Value Date/Time   NA 140 02/12/2014 1530   NA 138 10/04/2013 1102   K 4.4 02/12/2014 1530   CL 101 02/12/2014 1530   CO2 22 02/12/2014 1530   GLUCOSE 88 02/12/2014 1530   GLUCOSE 125* 10/04/2013 1102   BUN 14 02/12/2014 1530   BUN 14 10/04/2013 1102   CREATININE 1.22 02/12/2014 1530   CREATININE 1.26 10/04/2013 1102   CALCIUM 9.7 02/12/2014 1530   PROT 7.3 02/12/2014 1530   PROT 7.5 10/04/2013 1102   ALBUMIN 4.2 10/04/2013 1102   AST 22 02/12/2014 1530   ALT 25 02/12/2014 1530   ALKPHOS 83 02/12/2014 1530   BILITOT <0.2 02/12/2014 1530   GFRNONAA 68 02/12/2014 1530   GFRNONAA 65 10/04/2013 1102   GFRAA 78 02/12/2014 1530   GFRAA 75 10/04/2013 1102    Lab Results  Component Value Date/Time   CHOL 174 10/17/2013 10:42 AM    No components found with this basename: hga1c    Lab Results  Component Value Date/Time   AST 22 02/12/2014  3:30 PM    Assessment and Plan  Need for prophylactic vaccination and inoculation against influenza Flu shot given today.  Chronic pain syndrome - Plan: I have done prescription for acetaminophen-codeine (TYLENOL #2) 300-15 MG per tablet, he'll have to see pain management, I have done another referral Ambulatory referral to Pain Clinic  L4-L5 disc bulge - Plan: Patient insisting on seeing the neurosurgeon as per patient he wants a second opinion and thinks he needs a surgery,  Ambulatory referral to Neurosurgery  Essential hypertension Pressure is borderline elevated, I have advised patient for DASH diet continue with current meds.   Lorayne Marek, MD

## 2014-05-08 ENCOUNTER — Ambulatory Visit: Payer: Medicaid Other | Admitting: Nurse Practitioner

## 2014-05-22 ENCOUNTER — Encounter (HOSPITAL_COMMUNITY): Payer: Self-pay | Admitting: Emergency Medicine

## 2014-05-22 ENCOUNTER — Emergency Department (HOSPITAL_COMMUNITY)
Admission: EM | Admit: 2014-05-22 | Discharge: 2014-05-22 | Disposition: A | Discharge status: Home / Self Care | Payer: Medicaid Other | Attending: Emergency Medicine | Admitting: Emergency Medicine

## 2014-05-22 ENCOUNTER — Emergency Department (HOSPITAL_COMMUNITY): Payer: Medicaid Other

## 2014-05-22 DIAGNOSIS — I10 Essential (primary) hypertension: Secondary | ICD-10-CM | POA: Diagnosis not present

## 2014-05-22 DIAGNOSIS — Z8679 Personal history of other diseases of the circulatory system: Secondary | ICD-10-CM | POA: Insufficient documentation

## 2014-05-22 DIAGNOSIS — F329 Major depressive disorder, single episode, unspecified: Secondary | ICD-10-CM | POA: Diagnosis not present

## 2014-05-22 DIAGNOSIS — Z720 Tobacco use: Secondary | ICD-10-CM | POA: Insufficient documentation

## 2014-05-22 DIAGNOSIS — R112 Nausea with vomiting, unspecified: Secondary | ICD-10-CM | POA: Diagnosis present

## 2014-05-22 DIAGNOSIS — G8929 Other chronic pain: Secondary | ICD-10-CM | POA: Insufficient documentation

## 2014-05-22 DIAGNOSIS — R519 Headache, unspecified: Secondary | ICD-10-CM

## 2014-05-22 DIAGNOSIS — Z79899 Other long term (current) drug therapy: Secondary | ICD-10-CM | POA: Diagnosis not present

## 2014-05-22 DIAGNOSIS — Z8739 Personal history of other diseases of the musculoskeletal system and connective tissue: Secondary | ICD-10-CM | POA: Diagnosis not present

## 2014-05-22 DIAGNOSIS — R51 Headache: Secondary | ICD-10-CM | POA: Insufficient documentation

## 2014-05-22 DIAGNOSIS — R110 Nausea: Secondary | ICD-10-CM

## 2014-05-22 DIAGNOSIS — R52 Pain, unspecified: Secondary | ICD-10-CM

## 2014-05-22 MED ORDER — ONDANSETRON 8 MG PO TBDP
8.0000 mg | ORAL_TABLET | Freq: Once | ORAL | Status: AC
  Administered 2014-05-22: 8 mg via ORAL
  Filled 2014-05-22: qty 1

## 2014-05-22 MED ORDER — HYDROCODONE-ACETAMINOPHEN 5-325 MG PO TABS
2.0000 | ORAL_TABLET | Freq: Once | ORAL | Status: AC
  Administered 2014-05-22: 2 via ORAL
  Filled 2014-05-22: qty 2

## 2014-05-22 MED ORDER — IBUPROFEN 200 MG PO TABS
400.0000 mg | ORAL_TABLET | Freq: Once | ORAL | Status: AC
  Administered 2014-05-22: 400 mg via ORAL
  Filled 2014-05-22: qty 2

## 2014-05-22 MED ORDER — TRAMADOL HCL 50 MG PO TABS
50.0000 mg | ORAL_TABLET | Freq: Once | ORAL | Status: DC
  Filled 2014-05-22: qty 1

## 2014-05-22 NOTE — ED Notes (Signed)
Patient transported to CT via stretcher Patient in NAD Will medicate patient upon return

## 2014-05-22 NOTE — ED Notes (Signed)
Patient back from CT and remains in NAD 

## 2014-05-22 NOTE — ED Notes (Signed)
MD at bedside. 

## 2014-05-22 NOTE — ED Provider Notes (Signed)
CSN: [id number]     Arrival date & time 05/22/14  1057 History   First MD Initiated Contact with Patient 05/22/14 1107     Chief Complaint  Patient presents with  . Emesis     (Consider location/radiation/quality/duration/timing/severity/associated sxs/prior Treatment) Patient is a 53 y.o. male presenting with vomiting. The history is provided by the patient.  Emesis Associated symptoms: headaches   Associated symptoms: no abdominal pain, no chills, no diarrhea and no sore throat   pt c/o gradual onset frontal headache 2 days ago. Mild at onset. Pain constant, dull to throbbing, slowly worse. No acute or abrupt change today, however states today episode nv. Emesis was clear, not bloody or bilious. Denies abd pain. No diarrhea or constipation. No abd distension. Headache dull, persistent, mild photophobia. No neck pain or stiffness. Denies sinus congestion or drainage. No sore throat. Denies eye pain or change in vision. No numbness/weakness. No positional change to headache, no change w time of day. ?remote hx migraines. No recent head injury, trauma or fall. Denies fever or chills.      Past Medical History  Diagnosis Date  . Chronic back pain   . Hypertension   . Depression   . Small vessel disease     Right basal ganglia stroke  . History of alcohol abuse   . Sciatica neuralgia    Past Surgical History  Procedure Laterality Date  . Intercostal nerve block  2005  . Surgery back of head  1996-97   Family History  Problem Relation Age of Onset  . Heart disease Father    History  Substance Use Topics  . Smoking status: Current Some Day Smoker -- 0.30 packs/day    Types: Cigarettes  . Smokeless tobacco: Never Used  . Alcohol Use: No     Comment: History of alcohol abuse, marijuana use, cocaine use    Review of Systems  Constitutional: Negative for fever and chills.  HENT: Negative for sore throat.   Eyes: Negative for redness.  Respiratory: Negative for shortness of  breath.   Cardiovascular: Negative for chest pain and leg swelling.  Gastrointestinal: Positive for nausea and vomiting. Negative for abdominal pain, diarrhea and constipation.  Genitourinary: Negative for dysuria and flank pain.  Musculoskeletal: Negative for back pain and neck pain.  Skin: Negative for rash.  Neurological: Positive for headaches. Negative for syncope, speech difficulty, weakness and numbness.  Hematological: Does not bruise/bleed easily.  Psychiatric/Behavioral: Negative for confusion.      Allergies  Lisinopril  Home Medications   Prior to Admission medications   Medication Sig Start Date End Date Taking? Authorizing Provider  acetaminophen-codeine (TYLENOL #2) 300-15 MG per tablet Take 1 tablet by mouth every 8 (eight) hours as needed for moderate pain. 04/10/14   Lorayne Marek, MD  atenolol (TENORMIN) 50 MG tablet Take 1 tablet (50 mg total) by mouth daily. 02/03/14   Wandra Arthurs, MD  carbamazepine (TEGRETOL) 200 MG tablet Take 200 mg by mouth 2 (two) times daily. 07/05/13   Kathrynn Ducking, MD  cyclobenzaprine (FLEXERIL) 10 MG tablet Take 1 tablet (10 mg total) by mouth 3 (three) times daily as needed for muscle spasms. 10/04/13   Reyne Dumas, MD  diazepam (VALIUM) 5 MG tablet Take 1 tablet (5 mg total) by mouth every 8 (eight) hours as needed for muscle spasms (spasms). 02/03/14   Wandra Arthurs, MD  gabapentin (NEURONTIN) 600 MG tablet Take 1 tablet (600 mg total) by mouth 3 (three) times daily.  02/18/14   Kathrynn Ducking, MD  methocarbamol (ROBAXIN) 500 MG tablet Take 2 tablets (1,000 mg total) by mouth 4 (four) times daily. 04/06/14   Carlisle Cater, PA-C  omeprazole (PRILOSEC) 20 MG capsule Take 1 capsule (20 mg total) by mouth daily. 02/20/14   Montine Circle, PA-C  oxyCODONE-acetaminophen (PERCOCET/ROXICET) 5-325 MG per tablet Take 1-2 tablets by mouth every 6 (six) hours as needed for severe pain. 04/06/14   Carlisle Cater, PA-C   BP 151/90 mmHg  Pulse 76   Temp(Src) 98 F (36.7 C) (Oral)  Resp 16  SpO2 100% Physical Exam  Constitutional: He is oriented to person, place, and time. He appears well-developed and well-nourished. No distress.  HENT:  Head: Atraumatic.  Nose: Nose normal.  Mouth/Throat: Oropharynx is clear and moist.  No sinus or temporal tenderness. 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 Pupils are equal, round, and reactive to light. No scleral icterus.  Neck: Normal range of motion. Neck supple. No tracheal deviation present. No thyromegaly present.  No stiffness or rigidity  Cardiovascular: Normal rate, regular rhythm, normal heart sounds and intact distal pulses.   Pulmonary/Chest: Effort normal and breath sounds normal. No accessory muscle usage. No respiratory distress.  Abdominal: Soft. Bowel sounds are normal. He exhibits no distension. There is no tenderness.  Genitourinary:  No cva tenderness  Musculoskeletal: Normal range of motion. He exhibits no edema or tenderness.  Neurological: He is alert and oriented to person, place, and time. No cranial nerve deficit.  Speech clear, fluent. Motor intact bil, strength 5/5, sens light touch/pressure intact. Steady gait.   Skin: Skin is warm and dry. No rash noted. He is not diaphoretic.  Psychiatric: He has a normal mood and affect.  Nursing note and vitals reviewed.   ED Course  Procedures (including critical care time) Labs Review  Ct Head Wo Contrast  05/22/2014   CLINICAL DATA:  Acute onset headache and diaphoresis earlier today. Current history of migraines.  EXAM: CT HEAD WITHOUT CONTRAST  TECHNIQUE: Contiguous axial images were obtained from the base of the skull through the vertex without intravenous contrast.  COMPARISON:  12/25/2012.  FINDINGS: Ventricular system normal in size and appearance for age. Old lacunar strokes in the right basal ganglia, unchanged. No mass lesion. No midline shift. No  acute hemorrhage or hematoma. No extra-axial fluid collections. No evidence of acute infarction. No significant interval change.  Right frontal scalp lipoma. No skull fracture or other focal osseous abnormality involving the skull. Visualized paranasal sinuses, bilateral mastoid air cells and bilateral middle ear cavities well-aerated.  IMPRESSION: 1. No acute intracranial abnormality. 2. Old lacunar strokes in the right basal ganglia, stable. 3. Right frontal scalp lipoma.   Electronically Signed   By: Evangeline Dakin M.D.   On: 05/22/2014 11:55       MDM  Pt has ride, does not have to drive.  zofran po, hydrocodone po for symptom relief.  Imaging.  Reviewed nursing notes and prior charts for additional history.   Recheck pt feels much improved. No nv. Tolerating po fluids. Afeb.  Pt appears stable for d/c.      Mirna Mires, MD 05/22/14 [phone]

## 2014-05-22 NOTE — ED Notes (Signed)
Per pt, states unable to hold down water-no vomiting today-c/o headaches and sweating

## 2014-05-22 NOTE — ED Notes (Signed)
Patient upset that he is being DC and was not given "something stronger than Vicodin" Patient informed that he was offered Ultram, but refused EDP was made aware of this refusal and no new orders were given--told to DC patient Patient has been in NAD during this ED visit--talking and texting on cell phone Patient ambulatory, alert and oriented x 4 upon time of DC Refused DC VS

## 2014-05-22 NOTE — Discharge Instructions (Signed)
It was our pleasure to provide your ER care today - we hope that you feel better.  Rest. Drink plenty of fluids. Take motrin or aleve as need. Follow up with primary care doctor in the next couple days for recheck if symptoms fail to improve/resolve.  Return to ER if worse, new symptoms, worsening or severe head pain, persistent vomiting, fevers, numbness/weakness, other concern.   You were given pain medication in the ER - no driving for the next 4 hours.    Migraine Headache A migraine headache is an intense, throbbing pain on one or both sides of your head. A migraine can last for 30 minutes to several hours. CAUSES  The exact cause of a migraine headache is not always known. However, a migraine may be caused when nerves in the brain become irritated and release chemicals that cause inflammation. This causes pain. Certain things may also trigger migraines, such as:  Alcohol.  Smoking.  Stress.  Menstruation.  Aged cheeses.  Foods or drinks that contain nitrates, glutamate, aspartame, or tyramine.  Lack of sleep.  Chocolate.  Caffeine.  Hunger.  Physical exertion.  Fatigue.  Medicines used to treat chest pain (nitroglycerine), birth control pills, estrogen, and some blood pressure medicines. SIGNS AND SYMPTOMS  Pain on one or both sides of your head.  Pulsating or throbbing pain.  Severe pain that prevents daily activities.  Pain that is aggravated by any physical activity.  Nausea, vomiting, or both.  Dizziness.  Pain with exposure to bright lights, loud noises, or activity.  General sensitivity to bright lights, loud noises, or smells. Before you get a migraine, you may get warning signs that a migraine is coming (aura). An aura may include:  Seeing flashing lights.  Seeing bright spots, halos, or zigzag lines.  Having tunnel vision or blurred vision.  Having feelings of numbness or tingling.  Having trouble talking.  Having muscle  weakness. DIAGNOSIS  A migraine headache is often diagnosed based on:  Symptoms.  Physical exam.  A CT scan or MRI of your head. These imaging tests cannot diagnose migraines, but they can help rule out other causes of headaches. TREATMENT Medicines may be given for pain and nausea. Medicines can also be given to help prevent recurrent migraines.  HOME CARE INSTRUCTIONS  Only take over-the-counter or prescription medicines for pain or discomfort as directed by your health care provider. The use of long-term narcotics is not recommended.  Lie down in a dark, quiet room when you have a migraine.  Keep a journal to find out what may trigger your migraine headaches. For example, write down:  What you eat and drink.  How much sleep you get.  Any change to your diet or medicines.  Limit alcohol consumption.  Quit smoking if you smoke.  Get 7-9 hours of sleep, or as recommended by your health care provider.  Limit stress.  Keep lights dim if bright lights bother you and make your migraines worse. SEEK IMMEDIATE MEDICAL CARE IF:   Your migraine becomes severe.  You have a fever.  You have a stiff neck.  You have vision loss.  You have muscular weakness or loss of muscle control.  You start losing your balance or have trouble walking.  You feel faint or pass out.  You have severe symptoms that are different from your first symptoms. MAKE SURE YOU:   Understand these instructions.  Will watch your condition.  Will get help right away if you are not doing well  or get worse. Document Released: 07/04/2005 Document Revised: 11/18/2013 Document Reviewed: 03/11/2013 [facility] Patient Information 2015 [place], [place]. This information is not intended to replace advice given to you by your health care provider. Make sure you discuss any questions you have with your health care provider.    General Headache Without Cause A headache is pain or discomfort felt around the  head or neck area. The specific cause of a headache may not be found. There are many causes and types of headaches. A few common ones are:  Tension headaches.  Migraine headaches.  Cluster headaches.  Chronic daily headaches. HOME CARE INSTRUCTIONS   Keep all follow-up appointments with your caregiver or any specialist referral.  Only take over-the-counter or prescription medicines for pain or discomfort as directed by your caregiver.  Lie down in a dark, quiet room when you have a headache.  Keep a headache journal to find out what may trigger your migraine headaches. For example, write down:  What you eat and drink.  How much sleep you get.  Any change to your diet or medicines.  Try massage or other relaxation techniques.  Put ice packs or heat on the head and neck. Use these 3 to 4 times per day for 15 to 20 minutes each time, or as needed.  Limit stress.  Sit up straight, and do not tense your muscles.  Quit smoking if you smoke.  Limit alcohol use.  Decrease the amount of caffeine you drink, or stop drinking caffeine.  Eat and sleep on a regular schedule.  Get 7 to 9 hours of sleep, or as recommended by your caregiver.  Keep lights dim if bright lights bother you and make your headaches worse. SEEK MEDICAL CARE IF:   You have problems with the medicines you were prescribed.  Your medicines are not working.  You have a change from the usual headache.  You have nausea or vomiting. SEEK IMMEDIATE MEDICAL CARE IF:   Your headache becomes severe.  You have a fever.  You have a stiff neck.  You have loss of vision.  You have muscular weakness or loss of muscle control.  You start losing your balance or have trouble walking.  You feel faint or pass out.  You have severe symptoms that are different from your first symptoms. MAKE SURE YOU:   Understand these instructions.  Will watch your condition.  Will get help right away if you are not  doing well or get worse. Document Released: 07/04/2005 Document Revised: 09/26/2011 Document Reviewed: 07/20/2011 [facility] Patient Information 2015 [place], [place]. This information is not intended to replace advice given to you by your health care provider. Make sure you discuss any questions you have with your health care provider.

## 2014-05-28 ENCOUNTER — Ambulatory Visit: Payer: Medicaid Other | Attending: Internal Medicine | Admitting: Internal Medicine

## 2014-05-28 ENCOUNTER — Encounter: Payer: Self-pay | Admitting: Internal Medicine

## 2014-05-28 VITALS — BP 153/90 | HR 72 | Temp 98.3°F | Resp 16

## 2014-05-28 DIAGNOSIS — F17210 Nicotine dependence, cigarettes, uncomplicated: Secondary | ICD-10-CM | POA: Insufficient documentation

## 2014-05-28 DIAGNOSIS — Z79899 Other long term (current) drug therapy: Secondary | ICD-10-CM | POA: Insufficient documentation

## 2014-05-28 DIAGNOSIS — F329 Major depressive disorder, single episode, unspecified: Secondary | ICD-10-CM | POA: Diagnosis not present

## 2014-05-28 DIAGNOSIS — R110 Nausea: Secondary | ICD-10-CM | POA: Diagnosis not present

## 2014-05-28 DIAGNOSIS — I10 Essential (primary) hypertension: Secondary | ICD-10-CM | POA: Diagnosis not present

## 2014-05-28 DIAGNOSIS — F1021 Alcohol dependence, in remission: Secondary | ICD-10-CM | POA: Insufficient documentation

## 2014-05-28 DIAGNOSIS — M5126 Other intervertebral disc displacement, lumbar region: Secondary | ICD-10-CM

## 2014-05-28 DIAGNOSIS — M545 Low back pain: Secondary | ICD-10-CM | POA: Insufficient documentation

## 2014-05-28 DIAGNOSIS — Z23 Encounter for immunization: Secondary | ICD-10-CM

## 2014-05-28 DIAGNOSIS — Z09 Encounter for follow-up examination after completed treatment for conditions other than malignant neoplasm: Secondary | ICD-10-CM | POA: Diagnosis not present

## 2014-05-28 DIAGNOSIS — Z720 Tobacco use: Secondary | ICD-10-CM

## 2014-05-28 DIAGNOSIS — M5136 Other intervertebral disc degeneration, lumbar region: Secondary | ICD-10-CM

## 2014-05-28 DIAGNOSIS — G8929 Other chronic pain: Secondary | ICD-10-CM | POA: Insufficient documentation

## 2014-05-28 DIAGNOSIS — F17200 Nicotine dependence, unspecified, uncomplicated: Secondary | ICD-10-CM

## 2014-05-28 NOTE — Patient Instructions (Signed)
DASH Eating Plan °DASH stands for "Dietary Approaches to Stop Hypertension." The DASH eating plan is a healthy eating plan that has been shown to reduce high blood pressure (hypertension). Additional health benefits may include reducing the risk of type 2 diabetes mellitus, heart disease, and stroke. The DASH eating plan may also help with weight loss. °WHAT DO I NEED TO KNOW ABOUT THE DASH EATING PLAN? °For the DASH eating plan, you will follow these general guidelines: °· Choose foods with a percent daily value for sodium of less than 5% (as listed on the food label). °· Use salt-free seasonings or herbs instead of table salt or sea salt. °· Check with your health care provider or pharmacist before using salt substitutes. °· Eat lower-sodium products, often labeled as "lower sodium" or "no salt added." °· Eat fresh foods. °· Eat more vegetables, fruits, and low-fat dairy products. °· Choose whole grains. Look for the word "whole" as the first word in the ingredient list. °· Choose fish and skinless chicken or turkey more often than red meat. Limit fish, poultry, and meat to 6 oz (170 g) each day. °· Limit sweets, desserts, sugars, and sugary drinks. °· Choose heart-healthy fats. °· Limit cheese to 1 oz (28 g) per day. °· Eat more home-cooked food and less restaurant, buffet, and fast food. °· Limit fried foods. °· Cook foods using methods other than frying. °· Limit canned vegetables. If you do use them, rinse them well to decrease the sodium. °· When eating at a restaurant, ask that your food be prepared with less salt, or no salt if possible. °WHAT FOODS CAN I EAT? °Seek help from a dietitian for individual calorie needs. °Grains °Whole grain or whole wheat bread. Brown rice. Whole grain or whole wheat pasta. Quinoa, bulgur, and whole grain cereals. Low-sodium cereals. Corn or whole wheat flour tortillas. Whole grain cornbread. Whole grain crackers. Low-sodium crackers. °Vegetables °Fresh or frozen vegetables  (raw, steamed, roasted, or grilled). Low-sodium or reduced-sodium tomato and vegetable juices. Low-sodium or reduced-sodium tomato sauce and paste. Low-sodium or reduced-sodium canned vegetables.  °Fruits °All fresh, canned (in natural juice), or frozen fruits. °Meat and Other Protein Products °Ground beef (85% or leaner), grass-fed beef, or beef trimmed of fat. Skinless chicken or turkey. Ground chicken or turkey. Pork trimmed of fat. All fish and seafood. Eggs. Dried beans, peas, or lentils. Unsalted nuts and seeds. Unsalted canned beans. °Dairy °Low-fat dairy products, such as skim or 1% milk, 2% or reduced-fat cheeses, low-fat ricotta or cottage cheese, or plain low-fat yogurt. Low-sodium or reduced-sodium cheeses. °Fats and Oils °Tub margarines without trans fats. Light or reduced-fat mayonnaise and salad dressings (reduced sodium). Avocado. Safflower, olive, or canola oils. Natural peanut or almond butter. °Other °Unsalted popcorn and pretzels. °The items listed above may not be a complete list of recommended foods or beverages. Contact your dietitian for more options. °WHAT FOODS ARE NOT RECOMMENDED? °Grains °White bread. White pasta. White rice. Refined cornbread. Bagels and croissants. Crackers that contain trans fat. °Vegetables °Creamed or fried vegetables. Vegetables in a cheese sauce. Regular canned vegetables. Regular canned tomato sauce and paste. Regular tomato and vegetable juices. °Fruits °Dried fruits. Canned fruit in light or heavy syrup. Fruit juice. °Meat and Other Protein Products °Fatty cuts of meat. Ribs, chicken wings, bacon, sausage, bologna, salami, chitterlings, fatback, hot dogs, bratwurst, and packaged luncheon meats. Salted nuts and seeds. Canned beans with salt. °Dairy °Whole or 2% milk, cream, half-and-half, and cream cheese. Whole-fat or sweetened yogurt. Full-fat   cheeses or blue cheese. Nondairy creamers and whipped toppings. Processed cheese, cheese spreads, or cheese  curds. °Condiments °Onion and garlic salt, seasoned salt, table salt, and sea salt. Canned and packaged gravies. Worcestershire sauce. Tartar sauce. Barbecue sauce. Teriyaki sauce. Soy sauce, including reduced sodium. Steak sauce. Fish sauce. Oyster sauce. Cocktail sauce. Horseradish. Ketchup and mustard. Meat flavorings and tenderizers. Bouillon cubes. Hot sauce. Tabasco sauce. Marinades. Taco seasonings. Relishes. °Fats and Oils °Butter, stick margarine, lard, shortening, ghee, and bacon fat. Coconut, palm kernel, or palm oils. Regular salad dressings. °Other °Pickles and olives. Salted popcorn and pretzels. °The items listed above may not be a complete list of foods and beverages to avoid. Contact your dietitian for more information. °WHERE CAN I FIND MORE INFORMATION? °[employer], [employer]: www.nhlbi.nih.gov/health/health-topics/topics/dash/ °Document Released: 06/23/2011 Document Revised: 11/18/2013 Document Reviewed: 05/08/2013 °[employer]® Patient Information ©2015 [employer]. This information is not intended to replace advice given to you by your health care provider. Make sure you discuss any questions you have with your health care provider. ° °

## 2014-05-28 NOTE — Progress Notes (Signed)
MRN: [id number] Name: Jason Stokes  Sex: male Age: 53 y.o. DOB: 1961/02/18  Allergies: Lisinopril  Chief Complaint  Patient presents with  . Follow-up    HPI: Patient is 53 y.o. male who recently went to the emergency room with headache nausea vomiting comes today for followup, EMR reviewedpatient was given nausea medication and he improved, had a CT scan of head done which reported no acute abnormality, patient also history of chronic lower back pain already following up with neurosurgery.today's blood pressure is elevated ? Compliance with the blood pressure medication.  Past Medical History  Diagnosis Date  . Chronic back pain   . Hypertension   . Depression   . Small vessel disease     Right basal ganglia stroke  . History of alcohol abuse   . Sciatica neuralgia     Past Surgical History  Procedure Laterality Date  . Intercostal nerve block  2005  . Surgery back of head  1996-97      Medication List       This list is accurate as of: 05/28/14  3:05 PM.  Always use your most recent med list.               acetaminophen-codeine 300-15 MG per tablet  Commonly known as:  TYLENOL #2  Take 1 tablet by mouth every 8 (eight) hours as needed for moderate pain.     atenolol 50 MG tablet  Commonly known as:  TENORMIN  Take 1 tablet (50 mg total) by mouth daily.     carbamazepine 200 MG tablet  Commonly known as:  TEGRETOL  Take 200 mg by mouth 2 (two) times daily.     cyclobenzaprine 10 MG tablet  Commonly known as:  FLEXERIL  Take 1 tablet (10 mg total) by mouth 3 (three) times daily as needed for muscle spasms.     diazepam 5 MG tablet  Commonly known as:  VALIUM  Take 1 tablet (5 mg total) by mouth every 8 (eight) hours as needed for muscle spasms (spasms).     gabapentin 600 MG tablet  Commonly known as:  NEURONTIN  Take 1 tablet (600 mg total) by mouth 3 (three) times daily.     methocarbamol 500 MG tablet  Commonly known as:  ROBAXIN  Take 2  tablets (1,000 mg total) by mouth 4 (four) times daily.     omeprazole 20 MG capsule  Commonly known as:  PRILOSEC  Take 1 capsule (20 mg total) by mouth daily.     oxyCODONE-acetaminophen 5-325 MG per tablet  Commonly known as:  PERCOCET/ROXICET  Take 1-2 tablets by mouth every 6 (six) hours as needed for severe pain.        No orders of the defined types were placed in this encounter.    Immunization History  Administered Date(s) Administered  . Influenza Split 05/09/2013  . Influenza,inj,Quad PF,36+ Mos 04/10/2014    Family History  Problem Relation Age of Onset  . Heart disease Father     History  Substance Use Topics  . Smoking status: Current Some Day Smoker -- 0.30 packs/day    Types: Cigarettes  . Smokeless tobacco: Never Used  . Alcohol Use: No     Comment: History of alcohol abuse, marijuana use, cocaine use    Review of Systems   As noted in HPI  Filed Vitals:   05/28/14 1444  BP: 153/90  Pulse: 72  Temp: 98.3 F (36.8 C)  Resp: 16  Physical Exam  Physical Exam  Constitutional: No distress.  Eyes: EOM are normal. Pupils are equal, round, and reactive to light.  Cardiovascular: Normal rate and regular rhythm.   Pulmonary/Chest: Breath sounds normal. No respiratory distress. He has no wheezes. He has no rales.  Abdominal: Soft. There is no tenderness. There is no rebound.    CBC    Component Value Date/Time   WBC 4.4 02/12/2014 1530   WBC 4.6 10/04/2013 1102   RBC 5.57 02/12/2014 1530   RBC 5.64 10/04/2013 1102   HGB 14.1 02/12/2014 1530   HCT 43.4 02/12/2014 1530   PLT 235 02/12/2014 1530   MCV 78* 02/12/2014 1530   LYMPHSABS 1.5 02/12/2014 1530   LYMPHSABS 1.3 10/04/2013 1102   MONOABS 0.4 10/04/2013 1102   EOSABS 0.2 02/12/2014 1530   EOSABS 0.1 10/04/2013 1102   BASOSABS 0.0 02/12/2014 1530   BASOSABS 0.0 10/04/2013 1102    CMP     Component Value Date/Time   NA 140 02/12/2014 1530   NA 138 10/04/2013 1102   K 4.4  02/12/2014 1530   CL 101 02/12/2014 1530   CO2 22 02/12/2014 1530   GLUCOSE 88 02/12/2014 1530   GLUCOSE 125* 10/04/2013 1102   BUN 14 02/12/2014 1530   BUN 14 10/04/2013 1102   CREATININE 1.22 02/12/2014 1530   CREATININE 1.26 10/04/2013 1102   CALCIUM 9.7 02/12/2014 1530   PROT 7.3 02/12/2014 1530   PROT 7.5 10/04/2013 1102   ALBUMIN 4.2 10/04/2013 1102   AST 22 02/12/2014 1530   ALT 25 02/12/2014 1530   ALKPHOS 83 02/12/2014 1530   BILITOT <0.2 02/12/2014 1530   GFRNONAA 68 02/12/2014 1530   GFRNONAA 65 10/04/2013 1102   GFRAA 78 02/12/2014 1530   GFRAA 75 10/04/2013 1102    Lab Results  Component Value Date/Time   CHOL 174 10/17/2013 10:42 AM    No components found for: HGA1C  Lab Results  Component Value Date/Time   AST 22 02/12/2014 03:30 PM    Assessment and Plan  Essential hypertension Advised patient for DASH diet and take the medication regularly.  Nausea without vomiting Now resolved.  Chronic back pain/L4-L5 disc bulge followup with pain management/neurosurgery.  Patient is to smoke cigarettes, I have advised patient to quit smoking. Pneumovax given today. Patient is up-to-date with flu shot.  Health Maintenance Pneumovax today    Lorayne Marek, MD

## 2014-05-28 NOTE — Progress Notes (Signed)
Patient here for follow up from the ED Was seen for pounding headache and flu like symptoms

## 2014-06-15 ENCOUNTER — Other Ambulatory Visit: Payer: Self-pay | Admitting: Internal Medicine

## 2014-06-17 ENCOUNTER — Ambulatory Visit (INDEPENDENT_AMBULATORY_CARE_PROVIDER_SITE_OTHER): Payer: Medicaid Other | Admitting: Neurology

## 2014-06-17 ENCOUNTER — Encounter: Payer: Self-pay | Admitting: Neurology

## 2014-06-17 VITALS — BP 161/107 | HR 94 | Ht 72.0 in | Wt 262.0 lb

## 2014-06-17 DIAGNOSIS — M545 Low back pain: Secondary | ICD-10-CM

## 2014-06-17 DIAGNOSIS — G8929 Other chronic pain: Secondary | ICD-10-CM

## 2014-06-17 NOTE — Progress Notes (Signed)
Reason for visit: Low back pain  Jason Stokes is an 53 y.o. male  History of present illness:  Jason Stokes is a 53 year old right-handed black male with a history of chronic low back pain. The patient indicates that he has pain in his back that radiates down the right leg, and then goes across to the left side and down the left leg. The patient has unusual tremors of both legs that come and go. The patient has had an extensive workup that included EMG and nerve conduction studies of both legs that were unremarkable. The patient has had MRI evaluation of the cervical, thoracic, and lumbosacral spine without evidence of spinal cord compression or nerve root compression. The patient does have some disc protrusions that are central in the low back, which do not impinge upon any nerves. The patient has gotten some benefit with the TENS unit. He has had evidence of polysubstance abuse with urine drug screen positive for cocaine and marijuana. He no longer gets any controlled substances through this office for that reason. He recently was seen by Dr. Christella Noa, and surgery was not recommended. He felt that a spinal stimulator may be of some benefit for the patient.  Past Medical History  Diagnosis Date  . Chronic back pain   . Hypertension   . Depression   . Small vessel disease     Right basal ganglia stroke  . History of alcohol abuse   . Sciatica neuralgia     Past Surgical History  Procedure Laterality Date  . Intercostal nerve block  2005  . Surgery back of head  1996-97    Family History  Problem Relation Age of Onset  . Heart disease Father   . Schizophrenia Sister     Social history:  reports that he has been smoking Cigarettes.  He has been smoking about 0.30 packs per day. He has never used smokeless tobacco. He reports that he uses illicit drugs (Marijuana and Cocaine). He reports that he does not drink alcohol.    Allergies  Allergen Reactions  . Lisinopril Anaphylaxis      Swelling of lips and tongue.    Medications:  Current Outpatient Prescriptions on File Prior to Visit  Medication Sig Dispense Refill  . atenolol (TENORMIN) 50 MG tablet Take 1 tablet (50 mg total) by mouth daily. 30 tablet 0  . carbamazepine (TEGRETOL) 200 MG tablet Take 200 mg by mouth 2 (two) times daily.    Marland Kitchen gabapentin (NEURONTIN) 600 MG tablet Take 1 tablet (600 mg total) by mouth 3 (three) times daily. 90 tablet 3  . omeprazole (PRILOSEC) 20 MG capsule Take 1 capsule (20 mg total) by mouth daily. (Patient taking differently: Take 20 mg by mouth daily as needed. ) 30 capsule 0   No current facility-administered medications on file prior to visit.    ROS:  Out of a complete 14 system review of symptoms, the patient complains only of the following symptoms, and all other reviewed systems are negative.  Eye pain Food allergies Daytime sleepiness, acting out dreams  Blood pressure 161/107, pulse 94, height 6' (1.829 m), weight 262 lb (118.842 kg).  Physical Exam  General: The patient is alert and cooperative at the time of the examination.  Skin: No significant peripheral edema is noted.   Neurologic Exam  Mental status: The patient is oriented x 3.  Cranial nerves: Facial symmetry is present. Speech is normal, no aphasia or dysarthria is noted. Extraocular movements  are full. Visual fields are full.  Motor: The patient has good strength in all 4 extremities.  Sensory examination: Soft touch sensation is decreased in both legs, symmetric in the arms and face.  Coordination: The patient has good finger-nose-finger and heel-to-shin bilaterally.  Gait and station: The patient has a limping type gait on the right leg. The patient is using a single crutch for walking today. Tandem gait is unsteady. Romberg is unsteady.  Reflexes: Deep tendon reflexes are symmetric.   MRI lumbar 02/23/14:   IMPRESSION: Abnormal MRI scan of the lumbar spine showing mild disc  protrusions at L3-4 and L4-5 and by foraminal narrowing but without definite compression. Overall no significant change compared with previous MRI scan dated 02/21/2013.  Assessment/Plan:  1. Chronic low back pain  2. History of polysubstance abuse  The patient has recently been seen by Dr. Christella Noa, and the patient indicates that he recommended a spinal stimulator. The patient is to contact his office to see if a spinal stimulator could be inserted as a trial unit. The patient otherwise will follow-up through this office in about 6 months.  Jill Alexanders MD 06/17/2014 4:23 PM  [facility] [address] [place], [place] [address]  Phone [phone] Fax [phone]

## 2014-06-17 NOTE — Patient Instructions (Signed)
Back Pain, Adult Low back pain is very common. About 1 in 5 people have back pain.The cause of low back pain is rarely dangerous. The pain often gets better over time.About half of people with a sudden onset of back pain feel better in just 2 weeks. About 8 in 10 people feel better by 6 weeks.  CAUSES Some common causes of back pain include:  Strain of the muscles or ligaments supporting the spine.  Wear and tear (degeneration) of the spinal discs.  Arthritis.  Direct injury to the back. DIAGNOSIS Most of the time, the direct cause of low back pain is not known.However, back pain can be treated effectively even when the exact cause of the pain is unknown.Answering your caregiver's questions about your overall health and symptoms is one of the most accurate ways to make sure the cause of your pain is not dangerous. If your caregiver needs more information, he or she may order lab work or imaging tests (X-rays or MRIs).However, even if imaging tests show changes in your back, this usually does not require surgery. HOME CARE INSTRUCTIONS For many people, back pain returns.Since low back pain is rarely dangerous, it is often a condition that people can learn to manageon their own.   Remain active. It is stressful on the back to sit or stand in one place. Do not sit, drive, or stand in one place for more than 30 minutes at a time. Take short walks on level surfaces as soon as pain allows.Try to increase the length of time you walk each day.  Do not stay in bed.Resting more than 1 or 2 days can delay your recovery.  Do not avoid exercise or work.Your body is made to move.It is not dangerous to be active, even though your back may hurt.Your back will likely heal faster if you return to being active before your pain is gone.  Pay attention to your body when you bend and lift. Many people have less discomfortwhen lifting if they bend their knees, keep the load close to their bodies,and  avoid twisting. Often, the most comfortable positions are those that put less stress on your recovering back.  Find a comfortable position to sleep. Use a firm mattress and lie on your side with your knees slightly bent. If you lie on your back, put a pillow under your knees.  Only take over-the-counter or prescription medicines as directed by your caregiver. Over-the-counter medicines to reduce pain and inflammation are often the most helpful.Your caregiver may prescribe muscle relaxant drugs.These medicines help dull your pain so you can more quickly return to your normal activities and healthy exercise.  Put ice on the injured area.  Put ice in a plastic bag.  Place a towel between your skin and the bag.  Leave the ice on for 15-20 minutes, 03-04 times a day for the first 2 to 3 days. After that, ice and heat may be alternated to reduce pain and spasms.  Ask your caregiver about trying back exercises and gentle massage. This may be of some benefit.  Avoid feeling anxious or stressed.Stress increases muscle tension and can worsen back pain.It is important to recognize when you are anxious or stressed and learn ways to manage it.Exercise is a great option. SEEK MEDICAL CARE IF:  You have pain that is not relieved with rest or medicine.  You have pain that does not improve in 1 week.  You have new symptoms.  You are generally not feeling well. SEEK   IMMEDIATE MEDICAL CARE IF:   You have pain that radiates from your back into your legs.  You develop new bowel or bladder control problems.  You have unusual weakness or numbness in your arms or legs.  You develop nausea or vomiting.  You develop abdominal pain.  You feel faint. Document Released: 07/04/2005 Document Revised: 01/03/2012 Document Reviewed: 11/05/2013 ExitCare Patient Information 2015 [employer]. This information is not intended to replace advice given to you by your health care provider. Make sure you  discuss any questions you have with your health care provider.  

## 2014-06-23 ENCOUNTER — Telehealth: Payer: Self-pay | Admitting: Neurology

## 2014-06-23 NOTE — Telephone Encounter (Signed)
I called patient. The patient indicates that Dr. Christella Noa told him that he needed an indication from our office that the spinal stimulator should be done. I think that this may be a viable option for treatment of his chronic back pain. The patient has already talked with Dr. Christella Noa concerning this issue, and he had recommended that the spinal stimulator be placed.

## 2014-06-23 NOTE — Telephone Encounter (Signed)
Pt is waiting on Dr. Jannifer Franklin to send a note stating it is ok for Dr. Christella Noa to do a procedure (simulators) on Mr. Jason Stokes. Please call patient and advise if you are going to do this.

## 2014-06-30 ENCOUNTER — Telehealth: Payer: Self-pay | Admitting: Internal Medicine

## 2014-06-30 NOTE — Telephone Encounter (Signed)
Patient has presented to the clinic today to request a medication refill for omeprazole (PRILOSEC) 20 MG capsule; Please f/u with patient concerning refill

## 2014-07-01 ENCOUNTER — Ambulatory Visit: Payer: Medicaid Other | Attending: Internal Medicine | Admitting: *Deleted

## 2014-07-01 ENCOUNTER — Other Ambulatory Visit: Payer: Self-pay | Admitting: Emergency Medicine

## 2014-07-01 VITALS — BP 150/83 | HR 82 | Temp 99.0°F | Resp 22

## 2014-07-01 DIAGNOSIS — I10 Essential (primary) hypertension: Secondary | ICD-10-CM

## 2014-07-01 MED ORDER — ATENOLOL 50 MG PO TABS: 50.0000 mg | ORAL_TABLET | Freq: Every day | ORAL | Status: DC

## 2014-07-01 MED ORDER — OMEPRAZOLE 20 MG PO CPDR: 20.0000 mg | DELAYED_RELEASE_CAPSULE | Freq: Every day | ORAL | Status: DC

## 2014-07-01 NOTE — Progress Notes (Signed)
Patient presents for BP check Med list reviewed; states taking all meds as directed except atenolol.  States ran out 2 weeks ago and called but refill was not sent  BP 150/83 P 82 R  22 T   99.0 oral SPO2 97%  Refill of atenolol e-scribed to [facility] on [address] Patient to return in 2 weeks for nurse visit for BP check  Patient advised to call for med refills at least 7 days before running out so as not to go without. Patient aware that he is to f/u with PCP 3 months from last visit (Due 08/28/14)

## 2014-07-18 HISTORY — PX: KNEE ARTHROSCOPY: SHX127

## 2014-07-31 ENCOUNTER — Other Ambulatory Visit: Payer: Self-pay | Admitting: Neurology

## 2014-10-29 ENCOUNTER — Emergency Department (HOSPITAL_COMMUNITY): Payer: Medicaid Other

## 2014-10-29 ENCOUNTER — Emergency Department (HOSPITAL_COMMUNITY)
Admission: EM | Admit: 2014-10-29 | Discharge: 2014-10-29 | Disposition: A | Discharge status: Home / Self Care | Payer: Medicaid Other | Attending: Emergency Medicine | Admitting: Emergency Medicine

## 2014-10-29 ENCOUNTER — Encounter (HOSPITAL_COMMUNITY): Payer: Self-pay | Admitting: *Deleted

## 2014-10-29 DIAGNOSIS — I4891 Unspecified atrial fibrillation: Secondary | ICD-10-CM | POA: Insufficient documentation

## 2014-10-29 DIAGNOSIS — R067 Sneezing: Secondary | ICD-10-CM | POA: Diagnosis not present

## 2014-10-29 DIAGNOSIS — Z720 Tobacco use: Secondary | ICD-10-CM | POA: Insufficient documentation

## 2014-10-29 DIAGNOSIS — Z79899 Other long term (current) drug therapy: Secondary | ICD-10-CM | POA: Diagnosis not present

## 2014-10-29 DIAGNOSIS — R05 Cough: Secondary | ICD-10-CM | POA: Diagnosis not present

## 2014-10-29 DIAGNOSIS — J3489 Other specified disorders of nose and nasal sinuses: Secondary | ICD-10-CM | POA: Diagnosis not present

## 2014-10-29 DIAGNOSIS — G8929 Other chronic pain: Secondary | ICD-10-CM | POA: Insufficient documentation

## 2014-10-29 DIAGNOSIS — F329 Major depressive disorder, single episode, unspecified: Secondary | ICD-10-CM | POA: Diagnosis not present

## 2014-10-29 DIAGNOSIS — I10 Essential (primary) hypertension: Secondary | ICD-10-CM | POA: Diagnosis not present

## 2014-10-29 DIAGNOSIS — R51 Headache: Secondary | ICD-10-CM | POA: Diagnosis present

## 2014-10-29 DIAGNOSIS — Z8739 Personal history of other diseases of the musculoskeletal system and connective tissue: Secondary | ICD-10-CM | POA: Insufficient documentation

## 2014-10-29 DIAGNOSIS — R0981 Nasal congestion: Secondary | ICD-10-CM

## 2014-10-29 LAB — CBC WITH DIFFERENTIAL/PLATELET
Basophils Absolute: 0 10*3/uL (ref 0.0–0.1)
Basophils Relative: 0 % (ref 0–1)
Eosinophils Absolute: 0.1 10*3/uL (ref 0.0–0.7)
Eosinophils Relative: 2 % (ref 0–5)
HCT: 44.3 % (ref 39.0–52.0)
Hemoglobin: 13.9 g/dL (ref 13.0–17.0)
Lymphocytes Relative: 17 % (ref 12–46)
Lymphs Abs: 0.8 10*3/uL (ref 0.7–4.0)
MCH: 24.8 pg — ABNORMAL LOW (ref 26.0–34.0)
MCHC: 31.4 g/dL (ref 30.0–36.0)
MCV: 79.1 fL (ref 78.0–100.0)
Monocytes Absolute: 0.6 10*3/uL (ref 0.1–1.0)
Monocytes Relative: 14 % — ABNORMAL HIGH (ref 3–12)
Neutro Abs: 3.1 10*3/uL (ref 1.7–7.7)
Neutrophils Relative %: 67 % (ref 43–77)
Platelets: 151 10*3/uL (ref 150–400)
RBC: 5.6 MIL/uL (ref 4.22–5.81)
RDW: 15.2 % (ref 11.5–15.5)
WBC: 4.6 10*3/uL (ref 4.0–10.5)

## 2014-10-29 LAB — BASIC METABOLIC PANEL
Anion gap: 6 (ref 5–15)
BUN: 8 mg/dL (ref 6–23)
CO2: 30 mmol/L (ref 19–32)
Calcium: 9.1 mg/dL (ref 8.4–10.5)
Chloride: 103 mmol/L (ref 96–112)
Creatinine, Ser: 1.33 mg/dL (ref 0.50–1.35)
GFR calc Af Amer: 69 mL/min — ABNORMAL LOW (ref >90)
GFR calc non Af Amer: 60 mL/min — ABNORMAL LOW (ref >90)
Glucose, Bld: 109 mg/dL — ABNORMAL HIGH (ref 70–99)
Potassium: 3.9 mmol/L (ref 3.5–5.1)
Sodium: 139 mmol/L (ref 135–145)

## 2014-10-29 LAB — TROPONIN I: Troponin I: 0.03 ng/mL (ref <0.031)

## 2014-10-29 MED ORDER — APIXABAN 5 MG PO TABS: 5.0000 mg | ORAL_TABLET | Freq: Two times a day (BID) | ORAL | Status: DC

## 2014-10-29 MED ORDER — FLUTICASONE PROPIONATE 50 MCG/ACT NA SUSP: 2.0000 | Freq: Every day | NASAL | Status: DC

## 2014-10-29 MED ORDER — DILTIAZEM HCL ER COATED BEADS 120 MG PO CP24: 120.0000 mg | ORAL_CAPSULE | Freq: Every day | ORAL | Status: DC

## 2014-10-29 MED ORDER — KETOROLAC TROMETHAMINE 30 MG/ML IJ SOLN
60.0000 mg | Freq: Once | INTRAMUSCULAR | Status: DC
  Filled 2014-10-29: qty 2

## 2014-10-29 NOTE — Discharge Instructions (Signed)
Atrial Fibrillation °Atrial fibrillation is a type of irregular heart rhythm (arrhythmia). During atrial fibrillation, the upper chambers of the heart (atria) quiver continuously in a chaotic pattern. This causes an irregular and often rapid heart rate.  °Atrial fibrillation is the result of the heart becoming overloaded with disorganized signals that tell it to beat. These signals are normally released one at a time by a part of the right atrium called the sinoatrial node. They then travel from the atria to the lower chambers of the heart (ventricles), causing the atria and ventricles to contract and pump blood as they pass. In atrial fibrillation, parts of the atria outside of the sinoatrial node also release these signals. This results in two problems. First, the atria receive so many signals that they do not have time to fully contract. Second, the ventricles, which can only receive one signal at a time, beat irregularly and out of rhythm with the atria.  °There are three types of atrial fibrillation:  °· Paroxysmal. Paroxysmal atrial fibrillation starts suddenly and stops on its own within a week. °· Persistent. Persistent atrial fibrillation lasts for more than a week. It may stop on its own or with treatment. °· Permanent. Permanent atrial fibrillation does not go away. Episodes of atrial fibrillation may lead to permanent atrial fibrillation. °Atrial fibrillation can prevent your heart from pumping blood normally. It increases your risk of stroke and can lead to heart failure.  °CAUSES  °· Heart conditions, including a heart attack, heart failure, coronary artery disease, and heart valve conditions.   °· Inflammation of the sac that surrounds the heart (pericarditis). °· Blockage of an artery in the lungs (pulmonary embolism). °· Pneumonia or other infections. °· Chronic lung disease. °· Thyroid problems, especially if the thyroid is overactive (hyperthyroidism). °· Caffeine, excessive alcohol use, and use  of some illegal drugs.   °· Use of some medicines, including certain decongestants and diet pills. °· Heart surgery.   °· Birth defects.   °Sometimes, no cause can be found. When this happens, the atrial fibrillation is called lone atrial fibrillation. The risk of complications from atrial fibrillation increases if you have lone atrial fibrillation and you are age 60 years or older. °RISK FACTORS °· Heart failure. °· Coronary artery disease. °· Diabetes mellitus.   °· High blood pressure (hypertension).   °· Obesity.   °· Other arrhythmias.   °· Increased age. °SIGNS AND SYMPTOMS  °· A feeling that your heart is beating rapidly or irregularly.   °· A feeling of discomfort or pain in your chest.   °· Shortness of breath.   °· Sudden light-headedness or weakness.   °· Getting tired easily when exercising.   °· Urinating more often than normal (mainly when atrial fibrillation first begins).   °In paroxysmal atrial fibrillation, symptoms may start and suddenly stop. °DIAGNOSIS  °Your health care provider may be able to detect atrial fibrillation when taking your pulse. Your health care provider may have you take a test called an ambulatory electrocardiogram (ECG). An ECG records your heartbeat patterns over a 24-hour period. You may also have other tests, such as: °· Transthoracic echocardiogram (TTE). During echocardiography, sound waves are used to evaluate how blood flows through your heart. °· Transesophageal echocardiogram (TEE). °· Stress test. There is more than one type of stress test. If a stress test is needed, ask your health care provider about which type is best for you. °· Chest X-ray exam. °· Blood tests. °· Computed tomography (CT). °TREATMENT  °Treatment may include: °· Treating any underlying conditions. For example, if you   have an overactive thyroid, treating the condition may correct atrial fibrillation.  Taking medicine. Medicines may be given to control a rapid heart rate or to prevent blood  clots, heart failure, or a stroke.  Having a procedure to correct the rhythm of the heart:  Electrical cardioversion. During electrical cardioversion, a controlled, low-energy shock is delivered to the heart through your skin. If you have chest pain, very low blood pressure, or sudden heart failure, this procedure may need to be done as an emergency.  Catheter ablation. During this procedure, heart tissues that send the signals that cause atrial fibrillation are destroyed.  Surgical ablation. During this surgery, thin lines of heart tissue that carry the abnormal signals are destroyed. This procedure can either be an open-heart surgery or a minimally invasive surgery. With the minimally invasive surgery, small cuts are made to access the heart instead of a large opening.  Pulmonary venous isolation. During this surgery, tissue around the veins that carry blood from the lungs (pulmonary veins) is destroyed. This tissue is thought to carry the abnormal signals. HOME CARE INSTRUCTIONS   Take medicines only as directed by your health care provider. Some medicines can make atrial fibrillation worse or recur.  If blood thinners were prescribed by your health care provider, take them exactly as directed. Too much blood-thinning medicine can cause bleeding. If you take too little, you will not have the needed protection against stroke and other problems.  Perform blood tests at home if directed by your health care provider. Perform blood tests exactly as directed.  Quit smoking if you smoke.  Do not drink alcohol.  Do not drink caffeinated beverages such as coffee, soda, and some teas. You may drink decaffeinated coffee, soda, or tea.   Maintain a healthy weight.Do not use diet pills unless your health care provider approves. They may make heart problems worse.   Follow diet instructions as directed by your health care provider.  Exercise regularly as directed by your health care  provider.  Keep all follow-up visits as directed by your health care provider. This is important. PREVENTION  The following substances can cause atrial fibrillation to recur:   Caffeinated beverages.  Alcohol.  Certain medicines, especially those used for breathing problems.  Certain herbs and herbal medicines, such as those containing ephedra or ginseng.  Illegal drugs, such as cocaine and amphetamines. Sometimes medicines are given to prevent atrial fibrillation from recurring. Proper treatment of any underlying condition is also important in helping prevent recurrence.  SEEK MEDICAL CARE IF:  You notice a change in the rate, rhythm, or strength of your heartbeat.  You suddenly begin urinating more frequently.  You tire more easily when exerting yourself or exercising. SEEK IMMEDIATE MEDICAL CARE IF:   You have chest pain, abdominal pain, sweating, or weakness.  You feel nauseous.  You have shortness of breath.  You suddenly have swollen feet and ankles.  You feel dizzy.  Your face or limbs feel numb or weak.  You have a change in your vision or speech. MAKE SURE YOU:   Understand these instructions.  Will watch your condition.  Will get help right away if you are not doing well or get worse. Document Released: 07/04/2005 Document Revised: 11/18/2013 Document Reviewed: 08/14/2012 [facility] Patient Information 2015 [place], [place]. This information is not intended to replace advice given to you by your health care provider. Make sure you discuss any questions you have with your health care provider.     Anticoagulation, Generic  Anticoagulants are medicines used to prevent clots from developing in your veins. These medicine are also known as blood thinners. If blood clots are untreated, they could travel to your lungs. This is called a pulmonary embolus. A blood clot in your lungs can be fatal.  Health care providers often use anticoagulants to prevent clots  following surgery. Anticoagulants are also used along with aspirin when the heart is not getting enough blood. Another anticoagulant called warfarin is started 2 to 3 days after a rapid-acting injectable anticoagulant is started. The rapid-acting anticoagulants are usually continued until warfarin has begun to work. Your health care provider will judge this length of time by blood tests known as the prothrombin time (PT) and International Normalization Ratio (INR). This means that your blood is at the necessary and best level to prevent clots. RISKS AND COMPLICATIONS  If you have received recent epidural anesthesia, spinal anesthesia, or a spinal tap while receiving anticoagulants, you are at risk for developing a blood clot in or around the spine. This condition could result in long-term or permanent paralysis.  Because anticoagulants thin your blood, severe bleeding may occur from any tissue or organ. Symptoms of the blood being too thin may include:  Bleeding from the nose or gums that does not stop quickly.  Blood in bowel movements which may appear as bright red, dark, or black tarry stools.  Blood in the urine which may appear as pink, red, or brown urine.  Unusual bruising or bruising easily.  A cut that does not stop bleeding within 10 minutes.  Vomiting blood or continuous nausea for more than 1 day.  Coughing up blood.  Broken blood vessels in your eye (subconjunctival hemorrhage).  Abdominal or back pain with or without flank bruising.  Sudden, severe headache.  Sudden weakness or numbness of the face, arm, or leg, especially on one side of the body.  Sudden confusion.  Trouble speaking (aphasia) or understanding.  Sudden trouble seeing in one or both eyes.  Sudden trouble walking.  Dizziness.  Loss of balance or coordination.  Vaginal bleeding.  Swelling or pain at an injection site.  Superficial fat tissue death (necrosis) which may cause skin scarring. This  is more common in women and may first present as pain in the waist, thighs, or buttocks.  Fever.  Too little anticoagulation continues to allow the risk for blood clots. HOME CARE INSTRUCTIONS   Due to the complications of anticoagulants, it is very important that you take your anticoagulant as directed by your health care provider. Anticoagulants need to be taken exactly as instructed. Be sure you understand all your anticoagulant instructions.  Keep all follow-up appointments with your health care provider as directed. It is very important to keep your appointments. Not keeping appointments could result in a chronic or permanent injury, pain, or disability.  Warfarin. Your health care provider will advise you on the length of treatment (usually 3-6 months, sometimes lifelong).  Take warfarin exactly as directed by your health care provider. It is recommended that you take your warfarin dose at the same time of the day. It is preferred that you take warfarin in the late afternoon. If you have been told to stop taking warfarin, do not resume taking warfarin until directed to do so by your health care provider. Follow your health care provider's instructions if you accidentally take an extra dose or miss a dose of warfarin. It is very important to take warfarin as directed since bleeding or blood clots  could result in chronic or permanent injury, pain, or disability.  Too much and too little warfarin are both dangerous. Too much warfarin increases the risk of bleeding. Too little warfarin continues to allow the risk for blood clots. While taking warfarin, you will need to have regular blood tests to measure your blood clotting time. These blood tests usually include both the prothrombin time (PT) and International Normalized Ratio (INR) tests. The PT and INR results allow your health care provider to adjust your dose of warfarin. The dose can change for many reasons. It is critically important that you  have your PT and INR levels drawn exactly as directed. Your warfarin dose may stay the same or change depending on what the PT and INR results are. Be sure to follow up with your health care provider regarding your PT and INR test results and what your warfarin dosage should be.  Many medicines can interfere with warfarin and affect the PT and INR results. You must tell your health care provider about any and all medicines you take, this includes all vitamins and supplements. Ask your health care provider before taking these. Prescription and over-the-counter medicine consistency is critical to warfarin management. It is important that potential interactions are checked before you start a new medicine. Be especially cautious with aspirin and anti-inflammatory medicines. Ask your health care provider before taking these. Medicines such as antibiotics and acid-reducing medicine can interact with warfarin and can cause an increased warfarin effect. Warfarin can also interfere with the effectiveness of medicines you are taking. Do not take or discontinue any prescribed or over-the-counter medicine except on the advice of your health care provider or pharmacist.  Some vitamins, supplements, and herbal products interfere with the effectiveness of warfarin. Vitamin E may increase the anticoagulant effects of warfarin. Vitamin K may can cause warfarin to be less effective. Do not take or discontinue any vitamin, supplement, or herbal product except on the advice of your health care provider or pharmacist.  Eat what you normally eat and keep the vitamin K content of your diet consistent. Avoid major changes in your diet, or notify your health care provider before changing your diet. Suddenly getting a lot more vitamin K could cause your blood to clot too quickly. A sudden decrease in vitamin K intake could cause your blood to clot too slowly. These changes in vitamin K intake could lead to dangerous blood clotsor to  bleeding. To keep your vitamin K intake consistent, you must be aware of which foods contain moderate or high amounts of vitamin K. Some foods high in vitamin K include spinach, kale, broccoli, cabbage, greens, Brussels sprouts, asparagus, [place], coleslaw, parsley, and green tea. Arrange a visit with a dietitian to answer your questions.  If you have a loss of appetite or get the stomach flu (viral gastroenteritis), talk to your health care provider as soon as possible. A decrease in your normal vitamin K intake can make you more sensitive to your usual dose of warfarin.  Some medical conditions may increase your risk for bleeding while you are taking warfarin. A fever, diarrhea lasting more than a day, worsening heart failure, or worsening liver function are some medical conditions that could affect warfarin. Contact your health care provider if you have any of these medical conditions.  Alcohol can change the body's ability to handle warfarin. It is best to avoid alcoholic drinks or consume only very small amounts while taking warfarin. Notify your health care provider if you  change your alcohol intake. A sudden increase in alcohol use can increase your risk of bleeding. Chronic alcohol use can cause warfarin to be less effective.  Be careful not to cut yourself when using sharp objects or while shaving.  Inform all your health care providers and your dentist that you take an anticoagulant.  Limit physical activities or sports that could result in a fall or cause injury. Avoid contact sports.  Wear medical alert jewelry or carry a medical alert card. SEEK IMMEDIATE MEDICAL CARE IF:  You cough up blood.  You have dark or black stools or there is bright red blood coming from your rectum.  You vomit blood or have nausea for more than 1 day.  You have blood in the urine or pink colored urine.  You have unusual bruising or have increased bruising.  You have bleeding from the nose or gums  that does not stop quickly.  You have a cut that does not stop bleeding within a 2-3 minutes.  You have sudden weakness or numbness of the face, arm, or leg, especially on one side of the body.  You have sudden confusion.  You have trouble speaking (aphasia) or understanding.  You have sudden trouble seeing in one or both eyes.  You have sudden trouble walking.  You have dizziness.  You have a loss of balance or coordination.  You have a sudden, severe headache.  You have a serious fall or head injury, even if you are not bleeding.  You have swelling or pain at an injection site.  You have unexplained tenderness or pain in the abdomen, back, waist, thighs or buttocks.  You have a fever. Any of these symptoms may represent a serious problem that is an emergency. Do not wait to see if the symptoms will go away. Get medical help right away. Call your local emergency services (911 in [place] Do not drive yourself to the hospital. Document Released: 07/04/2005 Document Revised: 07/09/2013 Document Reviewed: 02/06/2008 [facility] Patient Information 2015 [place], [place]. This information is not intended to replace advice given to you by your health care provider. Make sure you discuss any questions you have with your health care provider.    Apixaban oral tablets What is this medicine? APIXABAN (a PIX a ban) is an anticoagulant (blood thinner). It is used to lower the chance of stroke in people with a medical condition called atrial fibrillation. It is also used to treat or prevent blood clots in the lungs or in the veins. This medicine may be used for other purposes; ask your health care provider or pharmacist if you have questions. COMMON BRAND NAME(S): Eliquis What should I tell my health care provider before I take this medicine? They need to know if you have any of these conditions: -bleeding disorders -bleeding in the brain -blood in your stools (black or tarry stools) or if  you have blood in your vomit -history of stomach bleeding -kidney disease -liver disease -mechanical heart valve -an unusual or allergic reaction to apixaban, other medicines, foods, dyes, or preservatives -pregnant or trying to get pregnant -breast-feeding How should I use this medicine? Take this medicine by mouth with a glass of water. Follow the directions on the prescription label. You can take it with or without food. If it upsets your stomach, take it with food. Take your medicine at regular intervals. Do not take it more often than directed. Do not stop taking except on your doctor's advice. Stopping this medicine may increase your  risk of a blot clot. Be sure to refill your prescription before you run out of medicine. Talk to your pediatrician regarding the use of this medicine in children. Special care may be needed. Overdosage: If you think you have taken too much of this medicine contact a poison control center or emergency room at once. NOTE: This medicine is only for you. Do not share this medicine with others. What if I miss a dose? If you miss a dose, take it as soon as you can. If it is almost time for your next dose, take only that dose. Do not take double or extra doses. What may interact with this medicine? This medicine may interact with the following: -aspirin and aspirin-like medicines -certain medicines for fungal infections like ketoconazole and itraconazole -certain medicines for seizures like carbamazepine and phenytoin -certain medicines that treat or prevent blood clots like warfarin, enoxaparin, and dalteparin -clarithromycin -NSAIDs, medicines for pain and inflammation, like ibuprofen or naproxen -rifampin -ritonavir -St. John's wort This list may not describe all possible interactions. Give your health care provider a list of all the medicines, herbs, non-prescription drugs, or dietary supplements you use. Also tell them if you smoke, drink alcohol, or use  illegal drugs. Some items may interact with your medicine. What should I watch for while using this medicine? Notify your doctor or health care professional and seek emergency treatment if you develop breathing problems; changes in vision; chest pain; severe, sudden headache; pain, swelling, warmth in the leg; trouble speaking; sudden numbness or weakness of the face, arm, or leg. These can be signs that your condition has gotten worse. If you are going to have surgery, tell your doctor or health care professional that you are taking this medicine. Tell your health care professional that you use this medicine before you have a spinal or epidural procedure. Sometimes people who take this medicine have bleeding problems around the spine when they have a spinal or epidural procedure. This bleeding is very rare. If you have a spinal or epidural procedure while on this medicine, call your health care professional immediately if you have back pain, numbness or tingling (especially in your legs and feet), muscle weakness, paralysis, or loss of bladder or bowel control. Avoid sports and activities that might cause injury while you are using this medicine. Severe falls or injuries can cause unseen bleeding. Be careful when using sharp tools or knives. Consider using an Copy. Take special care brushing or flossing your teeth. Report any injuries, bruising, or red spots on the skin to your doctor or health care professional. What side effects may I notice from receiving this medicine? Side effects that you should report to your doctor or health care professional as soon as possible: -allergic reactions like skin rash, itching or hives, swelling of the face, lips, or tongue -signs and symptoms of bleeding such as bloody or black, tarry stools; red or dark-brown urine; spitting up blood or brown material that looks like coffee grounds; red spots on the skin; unusual bruising or bleeding from the eye, gums, or  nose This list may not describe all possible side effects. Call your doctor for medical advice about side effects. You may report side effects to FDA at 1-800-FDA-1088. Where should I keep my medicine? Keep out of the reach of children. Store at room temperature between 20 and 25 degrees C (68 and 77 degrees F). Throw away any unused medicine after the expiration date. NOTE: This sheet is a summary. It  may not cover all possible information. If you have questions about this medicine, talk to your doctor, pharmacist, or health care provider.  2015, [employer]. (2013-03-08 11:59:24)   Diltiazem extended-release capsules or tablets What is this medicine? DILTIAZEM (dil TYE a zem) is a calcium-channel blocker. It affects the amount of calcium found in your heart and muscle cells. This relaxes your blood vessels, which can reduce the amount of work the heart has to do. This medicine is used to treat high blood pressure and chest pain caused by angina. This medicine may be used for other purposes; ask your health care provider or pharmacist if you have questions. COMMON BRAND NAME(S): Cardizem CD, Cardizem LA, Cardizem SR, Cartia XT, Dilacor XR, Dilt-CD, Diltia XT, Diltzac, Matzim LA, Rema Fendt, Tiamate, Tiazac What should I tell my health care provider before I take this medicine? They need to know if you have any of these conditions: -heart problems, low blood pressure, irregular heartbeat -liver disease -previous heart attack -an unusual or allergic reaction to diltiazem, other medicines, foods, dyes, or preservatives -pregnant or trying to get pregnant -breast-feeding How should I use this medicine? Take this medicine by mouth with a glass of water. Follow the directions on the prescription label. Swallow whole, do not crush or chew. Ask your doctor or pharmacist if your should take this medicine with food. Take your doses at regular intervals. Do not take your medicine more often  then directed. Do not stop taking except on the advice of your doctor or health care professional. Ask your doctor or health care professional how to gradually reduce the dose. Talk to your pediatrician regarding the use of this medicine in children. Special care may be needed. Overdosage: If you think you have taken too much of this medicine contact a poison control center or emergency room at once. NOTE: This medicine is only for you. Do not share this medicine with others. What if I miss a dose? If you miss a dose, take it as soon as you can. If it is almost time for your next dose, take only that dose. Do not take double or extra doses. What may interact with this medicine? Do not take this medicine with any of the following medications: -cisapride -hawthorn -pimozide -ranolazine -red yeast rice This medicine may also interact with the following medications: -buspirone -carbamazepine -cimetidine -cyclosporine -digoxin -local anesthetics or general anesthetics -lovastatin -medicines for anxiety or difficulty sleeping like midazolam and triazolam -medicines for high blood pressure or heart problems -quinidine -rifampin, rifabutin, or rifapentine This list may not describe all possible interactions. Give your health care provider a list of all the medicines, herbs, non-prescription drugs, or dietary supplements you use. Also tell them if you smoke, drink alcohol, or use illegal drugs. Some items may interact with your medicine. What should I watch for while using this medicine? Check your blood pressure and pulse rate regularly. Ask your doctor or health care professional what your blood pressure and pulse rate should be and when you should contact him or her. You may feel dizzy or lightheaded. Do not drive, use machinery, or do anything that needs mental alertness until you know how this medicine affects you. To reduce the risk of dizzy or fainting spells, do not sit or stand up  quickly, especially if you are an older patient. Alcohol can make you more dizzy or increase flushing and rapid heartbeats. Avoid alcoholic drinks. What side effects may I notice from receiving this medicine? Side effects  that you should report to your doctor or health care professional as soon as possible: -allergic reactions like skin rash, itching or hives, swelling of the face, lips, or tongue -confusion, mental depression -feeling faint or lightheaded, falls -redness, blistering, peeling or loosening of the skin, including inside the mouth -slow, irregular heartbeat -swelling of the feet and ankles -unusual bleeding or bruising, pinpoint red spots on the skin Side effects that usually do not require medical attention (report to your doctor or health care professional if they continue or are bothersome): -constipation or diarrhea -difficulty sleeping -facial flushing -headache -nausea, vomiting -sexual dysfunction -weak or tired This list may not describe all possible side effects. Call your doctor for medical advice about side effects. You may report side effects to FDA at 1-800-FDA-1088. Where should I keep my medicine? Keep out of the reach of children. Store at room temperature between 15 and 30 degrees C (59 and 86 degrees F). Protect from humidity. Throw away any unused medicine after the expiration date. NOTE: This sheet is a summary. It may not cover all possible information. If you have questions about this medicine, talk to your doctor, pharmacist, or health care provider.  2015, Elsevier/Gold Standard. (2007-10-25 14:35:47)

## 2014-10-29 NOTE — ED Notes (Signed)
Pt states he has seasonal allergies and experiences the same problem every year.

## 2014-10-29 NOTE — ED Provider Notes (Signed)
Patient seen in [place] [facility] complaining of Environmental allergies, sinus pressure, frontal headache - also complained of palpitations.  Noted to have an irregular heartbeat on exam.  EKG shows Afib and ST elevation, no old EKG for comparison. No hx AFIB.  Discussed with Dr Jeanell Sparrow.  Sent to Main ED for further evaluation.   Clayton Bibles, PA-C 10/29/14 [place], DO 10/29/14 1549

## 2014-10-29 NOTE — ED Provider Notes (Signed)
CSN: [id number]     Arrival date & time 10/29/14  1223 History   First MD Initiated Contact with Patient 10/29/14 1313     Chief Complaint  Patient presents with  . Headache     (Consider location/radiation/quality/duration/timing/severity/associated sxs/prior Treatment) HPI  54 year old male presents with new onset palpitations that started today. For the past 1 week he's been having sinus congestion and typical allergy symptoms he gets every time this year. He states he's been taking benadryl, Claritin, and Afrin with no relief. He went to the mailbox today notices heart was skipping beats and fluttering. There is no chest pain or shortness of breath. Due to this he decided get checked out in the ER. He denies any anginal symptoms and has no leg swelling or leg pain. No prior history of atrial fibrillation. He has not missed or changes medicines recently. Having intermittent headaches over past 1-2 weeks he attributes to migraines. No current headache.  Past Medical History  Diagnosis Date  . Chronic back pain   . Hypertension   . Depression   . Small vessel disease     Right basal ganglia stroke  . History of alcohol abuse   . Sciatica neuralgia    Past Surgical History  Procedure Laterality Date  . Intercostal nerve block  2005  . Surgery back of head  1996-97   Family History  Problem Relation Age of Onset  . Heart disease Father   . Schizophrenia Sister    History  Substance Use Topics  . Smoking status: Current Some Day Smoker -- 0.30 packs/day    Types: Cigarettes  . Smokeless tobacco: Never Used     Comment: smoking 3 cigs/day  . Alcohol Use: No     Comment: History of alcohol abuse, marijuana use, cocaine use    Review of Systems  Constitutional: Negative for fever.  HENT: Positive for congestion, sinus pressure and sneezing.   Respiratory: Positive for cough. Negative for shortness of breath.   Cardiovascular: Positive for palpitations. Negative for chest  pain and leg swelling.  Gastrointestinal: Negative for vomiting and abdominal pain.  Neurological: Positive for headaches (intermittent).  All other systems reviewed and are negative.     Allergies  Lisinopril  Home Medications   Prior to Admission medications   Medication Sig Start Date End Date Taking? Authorizing Provider  ABILIFY 15 MG tablet Take 15 mg by mouth daily. 04/30/14   Historical Provider, MD  atenolol (TENORMIN) 50 MG tablet Take 1 tablet (50 mg total) by mouth daily. 07/01/14   Lorayne Marek, MD  carbamazepine (TEGRETOL) 200 MG tablet Take 200 mg by mouth 2 (two) times daily. 07/05/13   Kathrynn Ducking, MD  carbamazepine (TEGRETOL) 200 MG tablet TAKE 1/2 TABLET TWICE A DAY FOR 2 WEEKS, THEN TAKE ONE TABLET TWICE A DAY 07/31/14   Kathrynn Ducking, MD  CYMBALTA 60 MG capsule Take 60 mg by mouth daily. 04/29/14   Historical Provider, MD  gabapentin (NEURONTIN) 600 MG tablet Take 1 tablet (600 mg total) by mouth 3 (three) times daily. 02/18/14   Kathrynn Ducking, MD  omeprazole (PRILOSEC) 20 MG capsule Take 1 capsule (20 mg total) by mouth daily. 07/01/14   Lorayne Marek, MD  oxyCODONE-acetaminophen (PERCOCET) 10-325 MG per tablet Take 1 tablet by mouth every 6 (six) hours as needed. 05/13/14   Historical Provider, MD  traZODone (DESYREL) 100 MG tablet Take 100 mg by mouth at bedtime. 03/10/14   Historical Provider, MD  BP 132/77 mmHg  Pulse 95  Temp(Src) 97.9 F (36.6 C)  Resp 16  Ht 6' (1.829 m)  Wt 275 lb (124.739 kg)  BMI 37.29 kg/m2  SpO2 99% Physical Exam  Constitutional: He is oriented to person, place, and time. He appears well-developed and well-nourished. No distress.  HENT:  Head: Normocephalic and atraumatic.  Right Ear: External ear normal.  Left Ear: External ear normal.  Nose: Nose normal.  Mild sinus pressure in maxillary sinuses  Eyes: Right eye exhibits no discharge. Left eye exhibits no discharge.  Neck: Neck supple.  Cardiovascular: Normal  rate, normal heart sounds and intact distal pulses.  An irregularly irregular rhythm present.  Pulmonary/Chest: Effort normal and breath sounds normal.  Abdominal: Soft. There is no tenderness.  Musculoskeletal: He exhibits no edema or tenderness.  Neurological: He is alert and oriented to person, place, and time.  Skin: Skin is warm and dry. He is not diaphoretic.  Nursing note and vitals reviewed.   ED Course  Procedures (including critical care time) Labs Review Labs Reviewed  CBC WITH DIFFERENTIAL/PLATELET - Abnormal; Notable for the following:    MCH 24.8 (*)    Monocytes Relative 14 (*)    All other components within normal limits  BASIC METABOLIC PANEL - Abnormal; Notable for the following:    Glucose, Bld 109 (*)    GFR calc non Af Amer 60 (*)    GFR calc Af Amer 69 (*)    All other components within normal limits  TROPONIN I    Imaging Review Dg Chest 2 View  10/29/2014   CLINICAL DATA:  54 year old male with congestion and sinus headache for 1 week. Initial encounter.  EXAM: CHEST  2 VIEW  COMPARISON:  Thoracic spine series [id number] and earlier.  FINDINGS: Cardiac size is stable at the upper limits of normal. Other mediastinal contours are within normal limits. Lung volumes are within normal limits. No pneumothorax, pulmonary edema, pleural effusion or confluent pulmonary opacity. No acute osseous abnormality identified.  IMPRESSION: No acute cardiopulmonary abnormality.   Electronically Signed   By: Genevie Ann M.D.   On: 10/29/2014 16:12     EKG Interpretation   Date/Time:  Wednesday October 29 2014 13:43:44 EDT Ventricular Rate:  88 PR Interval:    QRS Duration: 78 QT Interval:  334 QTC Calculation: 404 R Axis:   44 Text Interpretation:  Atrial fibrillation ST elevation, consider early  repolarization Abnormal ECG No old tracing to compare Confirmed by  Taye Cato  MD, Aleeza [place] ([id number]) on 10/29/2014 3:00:09 PM      EKG#2  EKG Interpretation  Date/Time:  Wednesday October 29 2014 15:20:18 EDT Ventricular Rate:  92 PR Interval:    QRS Duration: 74 QT Interval:  344 QTC Calculation: 425 R Axis:   41 Text Interpretation:  Atrial fibrillation Ventricular premature complex no significant change from earlier Confirmed by Josceline Chenard  MD, Tareq Dwan ([id number]) on 10/29/2014 3:48:48 PM       MDM   Final diagnoses:  Atrial fibrillation, new onset  Sinus congestion    54 year old male with new onset atrial fibrillation. Patient had palpitations prior to arrival but none since coming into the ER. Hemodynamically stable, heart rate between 60s and low 100s. No chest pain or shortness of breath. He is also having unrelated sinus congestion that he states he gets every year. Will add a nasal steroid for this. No signs of acute infection. Discussed his new afib with Dr. Marigene Ehlers of cardiology who recommends starting  him on diltiazem 120 mg in addition to his atenolol as well as Eliquis 5 mg twice a day given his prior stroke history. I discussed these new medicines with patient. I discussed the risks/benefits of anticoagulation. Patient verbalized understanding, is hesitant first to start, I will write him a prescription as he thinks more about it. Cardiology will arrange outpatient follow-up and patient will also follow-up with PCP.    Sherwood Gambler, MD 10/29/14 [phone]

## 2014-10-29 NOTE — ED Notes (Signed)
Pt reports sinus headache and congestion x 1 week. Reports congestion is causing migraines. No acute distress noted at triage. No relief with otc allergy medication.

## 2014-11-10 ENCOUNTER — Other Ambulatory Visit: Payer: Self-pay | Admitting: Internal Medicine

## 2014-11-17 ENCOUNTER — Encounter: Payer: Medicaid Other | Admitting: Cardiology

## 2014-12-12 ENCOUNTER — Other Ambulatory Visit: Payer: Self-pay | Admitting: Internal Medicine

## 2014-12-17 ENCOUNTER — Ambulatory Visit: Payer: Medicaid Other | Admitting: Neurology

## 2014-12-22 ENCOUNTER — Telehealth: Payer: Self-pay | Admitting: Internal Medicine

## 2014-12-22 NOTE — Telephone Encounter (Signed)
Patient has come in today to let PCP know he has not taken his BP medication in two weeks; Patient has an OV tomorrow for a Back Brace; please f/u with patient, [facility] [address]

## 2014-12-23 ENCOUNTER — Encounter: Payer: Self-pay | Admitting: Internal Medicine

## 2014-12-23 ENCOUNTER — Ambulatory Visit: Payer: Medicaid Other | Attending: Internal Medicine | Admitting: Internal Medicine

## 2014-12-23 VITALS — BP 149/100 | HR 78 | Temp 98.6°F | Resp 15 | Wt 270.9 lb

## 2014-12-23 DIAGNOSIS — F17210 Nicotine dependence, cigarettes, uncomplicated: Secondary | ICD-10-CM | POA: Diagnosis not present

## 2014-12-23 DIAGNOSIS — Z7901 Long term (current) use of anticoagulants: Secondary | ICD-10-CM | POA: Insufficient documentation

## 2014-12-23 DIAGNOSIS — M545 Low back pain: Secondary | ICD-10-CM

## 2014-12-23 DIAGNOSIS — F329 Major depressive disorder, single episode, unspecified: Secondary | ICD-10-CM | POA: Diagnosis not present

## 2014-12-23 DIAGNOSIS — Z8673 Personal history of transient ischemic attack (TIA), and cerebral infarction without residual deficits: Secondary | ICD-10-CM | POA: Insufficient documentation

## 2014-12-23 DIAGNOSIS — I10 Essential (primary) hypertension: Secondary | ICD-10-CM | POA: Diagnosis not present

## 2014-12-23 DIAGNOSIS — Z79899 Other long term (current) drug therapy: Secondary | ICD-10-CM | POA: Insufficient documentation

## 2014-12-23 DIAGNOSIS — Z7951 Long term (current) use of inhaled steroids: Secondary | ICD-10-CM | POA: Insufficient documentation

## 2014-12-23 DIAGNOSIS — M5126 Other intervertebral disc displacement, lumbar region: Secondary | ICD-10-CM

## 2014-12-23 DIAGNOSIS — M25561 Pain in right knee: Secondary | ICD-10-CM

## 2014-12-23 DIAGNOSIS — G8929 Other chronic pain: Secondary | ICD-10-CM | POA: Diagnosis not present

## 2014-12-23 DIAGNOSIS — M5136 Other intervertebral disc degeneration, lumbar region: Secondary | ICD-10-CM

## 2014-12-23 MED ORDER — UNABLE TO FIND: Status: DC

## 2014-12-23 MED ORDER — ATENOLOL 50 MG PO TABS: ORAL_TABLET | ORAL | Status: DC

## 2014-12-23 NOTE — Patient Instructions (Signed)
DASH Eating Plan °DASH stands for "Dietary Approaches to Stop Hypertension." The DASH eating plan is a healthy eating plan that has been shown to reduce high blood pressure (hypertension). Additional health benefits may include reducing the risk of type 2 diabetes mellitus, heart disease, and stroke. The DASH eating plan may also help with weight loss. °WHAT DO I NEED TO KNOW ABOUT THE DASH EATING PLAN? °For the DASH eating plan, you will follow these general guidelines: °· Choose foods with a percent daily value for sodium of less than 5% (as listed on the food label). °· Use salt-free seasonings or herbs instead of table salt or sea salt. °· Check with your health care provider or pharmacist before using salt substitutes. °· Eat lower-sodium products, often labeled as "lower sodium" or "no salt added." °· Eat fresh foods. °· Eat more vegetables, fruits, and low-fat dairy products. °· Choose whole grains. Look for the word "whole" as the first word in the ingredient list. °· Choose fish and skinless chicken or turkey more often than red meat. Limit fish, poultry, and meat to 6 oz (170 g) each day. °· Limit sweets, desserts, sugars, and sugary drinks. °· Choose heart-healthy fats. °· Limit cheese to 1 oz (28 g) per day. °· Eat more home-cooked food and less restaurant, buffet, and fast food. °· Limit fried foods. °· Cook foods using methods other than frying. °· Limit canned vegetables. If you do use them, rinse them well to decrease the sodium. °· When eating at a restaurant, ask that your food be prepared with less salt, or no salt if possible. °WHAT FOODS CAN I EAT? °Seek help from a dietitian for individual calorie needs. °Grains °Whole grain or whole wheat bread. Brown rice. Whole grain or whole wheat pasta. Quinoa, bulgur, and whole grain cereals. Low-sodium cereals. Corn or whole wheat flour tortillas. Whole grain cornbread. Whole grain crackers. Low-sodium crackers. °Vegetables °Fresh or frozen vegetables  (raw, steamed, roasted, or grilled). Low-sodium or reduced-sodium tomato and vegetable juices. Low-sodium or reduced-sodium tomato sauce and paste. Low-sodium or reduced-sodium canned vegetables.  °Fruits °All fresh, canned (in natural juice), or frozen fruits. °Meat and Other Protein Products °Ground beef (85% or leaner), grass-fed beef, or beef trimmed of fat. Skinless chicken or turkey. Ground chicken or turkey. Pork trimmed of fat. All fish and seafood. Eggs. Dried beans, peas, or lentils. Unsalted nuts and seeds. Unsalted canned beans. °Dairy °Low-fat dairy products, such as skim or 1% milk, 2% or reduced-fat cheeses, low-fat ricotta or cottage cheese, or plain low-fat yogurt. Low-sodium or reduced-sodium cheeses. °Fats and Oils °Tub margarines without trans fats. Light or reduced-fat mayonnaise and salad dressings (reduced sodium). Avocado. Safflower, olive, or canola oils. Natural peanut or almond butter. °Other °Unsalted popcorn and pretzels. °The items listed above may not be a complete list of recommended foods or beverages. Contact your dietitian for more options. °WHAT FOODS ARE NOT RECOMMENDED? °Grains °White bread. White pasta. White rice. Refined cornbread. Bagels and croissants. Crackers that contain trans fat. °Vegetables °Creamed or fried vegetables. Vegetables in a cheese sauce. Regular canned vegetables. Regular canned tomato sauce and paste. Regular tomato and vegetable juices. °Fruits °Dried fruits. Canned fruit in light or heavy syrup. Fruit juice. °Meat and Other Protein Products °Fatty cuts of meat. Ribs, chicken wings, bacon, sausage, bologna, salami, chitterlings, fatback, hot dogs, bratwurst, and packaged luncheon meats. Salted nuts and seeds. Canned beans with salt. °Dairy °Whole or 2% milk, cream, half-and-half, and cream cheese. Whole-fat or sweetened yogurt. Full-fat   cheeses or blue cheese. Nondairy creamers and whipped toppings. Processed cheese, cheese spreads, or cheese  curds. °Condiments °Onion and garlic salt, seasoned salt, table salt, and sea salt. Canned and packaged gravies. Worcestershire sauce. Tartar sauce. Barbecue sauce. Teriyaki sauce. Soy sauce, including reduced sodium. Steak sauce. Fish sauce. Oyster sauce. Cocktail sauce. Horseradish. Ketchup and mustard. Meat flavorings and tenderizers. Bouillon cubes. Hot sauce. Tabasco sauce. Marinades. Taco seasonings. Relishes. °Fats and Oils °Butter, stick margarine, lard, shortening, ghee, and bacon fat. Coconut, palm kernel, or palm oils. Regular salad dressings. °Other °Pickles and olives. Salted popcorn and pretzels. °The items listed above may not be a complete list of foods and beverages to avoid. Contact your dietitian for more information. °WHERE CAN I FIND MORE INFORMATION? °[employer], [employer]: www.nhlbi.nih.gov/health/health-topics/topics/dash/ °Document Released: 06/23/2011 Document Revised: 11/18/2013 Document Reviewed: 05/08/2013 °[employer]® Patient Information ©2015 [employer]. This information is not intended to replace advice given to you by your health care provider. Make sure you discuss any questions you have with your health care provider. ° °

## 2014-12-23 NOTE — Progress Notes (Signed)
MRN: [id number] Name: Jason Stokes  Sex: male Age: 54 y.o. DOB: October 27, 1960  Allergies: Lisinopril  Chief Complaint  Patient presents with  . Knee Pain    HPI: Patient is 54 y.o. male who has history of hypertension, chronic low back pain with previous MRI showing disc bulging, patient has been following up with the neurology and apparently he is also seen the spine specialist,today is complaining of lower back pain right knee pain which he thinks the pain is coming from his back denies any recent fall or trauma he is requesting prescription for back brace he has one which is for 54 years old and he needs another one, he also ran out of his blood pressure medication and his blood pressure is borderline elevated, denies any headache dizziness chest and shortness of breath patient is also going to follow with cardiology.  Past Medical History  Diagnosis Date  . Chronic back pain   . Hypertension   . Depression   . Small vessel disease     Right basal ganglia stroke  . History of alcohol abuse   . Sciatica neuralgia     Past Surgical History  Procedure Laterality Date  . Intercostal nerve block  2005  . Surgery back of head  1996-97      Medication List       This list is accurate as of: 12/23/14  4:48 PM.  Always use your most recent med list.               ABILIFY 15 MG tablet  Generic drug:  ARIPiprazole  Take 15 mg by mouth daily.     apixaban 5 MG Tabs tablet  Commonly known as:  ELIQUIS  Take 1 tablet (5 mg total) by mouth 2 (two) times daily.     atenolol 50 MG tablet  Commonly known as:  TENORMIN  TAKE 1 TABLET (50 MG TOTAL) BY MOUTH DAILY.     carbamazepine 200 MG tablet  Commonly known as:  TEGRETOL  Take 200 mg by mouth 2 (two) times daily.     carbamazepine 200 MG tablet  Commonly known as:  TEGRETOL  TAKE 1/2 TABLET TWICE A DAY FOR 2 WEEKS, THEN TAKE ONE TABLET TWICE A DAY     CYMBALTA 60 MG capsule  Generic drug:  DULoxetine  Take 60 mg by  mouth daily.     diltiazem 120 MG 24 hr capsule  Commonly known as:  CARDIZEM CD  Take 1 capsule (120 mg total) by mouth daily.     fluticasone 50 MCG/ACT nasal spray  Commonly known as:  FLONASE  Place 2 sprays into both nostrils daily.     gabapentin 600 MG tablet  Commonly known as:  NEURONTIN  Take 1 tablet (600 mg total) by mouth 3 (three) times daily.     HYDROcodone-acetaminophen 10-325 MG per tablet  Commonly known as:  NORCO  Take 1 tablet by mouth every 6 (six) hours as needed.     omeprazole 20 MG capsule  Commonly known as:  PRILOSEC  Take 1 capsule (20 mg total) by mouth daily.     oxyCODONE-acetaminophen 10-325 MG per tablet  Commonly known as:  PERCOCET  Take 1 tablet by mouth every 6 (six) hours as needed.     traZODone 100 MG tablet  Commonly known as:  DESYREL  Take 100 mg by mouth at bedtime.     UNABLE TO FIND  - Back Brace   -   -  Dx chronic lower back  Pain   - Dx lumbar disc bulge        Meds ordered this encounter  Medications  . UNABLE TO FIND    Sig: Back Brace   Dx chronic lower back  Pain  Dx lumbar disc bulge    Dispense:  1 each    Refill:  0  . atenolol (TENORMIN) 50 MG tablet    Sig: TAKE 1 TABLET (50 MG TOTAL) BY MOUTH DAILY.    Dispense:  30 tablet    Refill:  3    PCP appointment needed before getting additional refills.    Immunization History  Administered Date(s) Administered  . Influenza Split 05/09/2013  . Influenza,inj,Quad PF,36+ Mos 04/10/2014  . Pneumococcal Polysaccharide-23 05/28/2014    Family History  Problem Relation Age of Onset  . Heart disease Father   . Schizophrenia Sister     History  Substance Use Topics  . Smoking status: Current Some Day Smoker -- 0.30 packs/day    Types: Cigarettes  . Smokeless tobacco: Never Used     Comment: smoking 3 cigs/day  . Alcohol Use: No     Comment: History of alcohol abuse, marijuana use, cocaine use    Review of Systems   As noted in HPI  Filed  Vitals:   12/23/14 1545  BP: 149/100  Pulse: 78  Temp: 98.6 F (37 C)  Resp: 15    Physical Exam  Physical Exam  Constitutional: No distress.  Cardiovascular: Normal rate and regular rhythm.   Pulmonary/Chest: No respiratory distress. He has no wheezes. He has no rales.  Musculoskeletal:  Patient is wearing a back brace, right knee no erythema or swelling or tenderness    CBC    Component Value Date/Time   WBC 4.6 10/29/2014 1643   WBC 4.4 02/12/2014 1530   RBC 5.60 10/29/2014 1643   RBC 5.57 02/12/2014 1530   HGB 13.9 10/29/2014 1643   HCT 44.3 10/29/2014 1643   PLT 151 10/29/2014 1643   MCV 79.1 10/29/2014 1643   LYMPHSABS 0.8 10/29/2014 1643   LYMPHSABS 1.5 02/12/2014 1530   MONOABS 0.6 10/29/2014 1643   EOSABS 0.1 10/29/2014 1643   EOSABS 0.2 02/12/2014 1530   BASOSABS 0.0 10/29/2014 1643   BASOSABS 0.0 02/12/2014 1530    CMP     Component Value Date/Time   NA 139 10/29/2014 1643   NA 140 02/12/2014 1530   K 3.9 10/29/2014 1643   CL 103 10/29/2014 1643   CO2 30 10/29/2014 1643   GLUCOSE 109* 10/29/2014 1643   GLUCOSE 88 02/12/2014 1530   BUN 8 10/29/2014 1643   BUN 14 02/12/2014 1530   CREATININE 1.33 10/29/2014 1643   CREATININE 1.26 10/04/2013 1102   CALCIUM 9.1 10/29/2014 1643   PROT 7.3 02/12/2014 1530   PROT 7.5 10/04/2013 1102   ALBUMIN 4.2 10/04/2013 1102   AST 22 02/12/2014 1530   ALT 25 02/12/2014 1530   ALKPHOS 83 02/12/2014 1530   BILITOT <0.2 02/12/2014 1530   GFRNONAA 60* 10/29/2014 1643   GFRNONAA 65 10/04/2013 1102   GFRAA 69* 10/29/2014 1643   GFRAA 75 10/04/2013 1102    Lab Results  Component Value Date/Time   CHOL 174 10/17/2013 10:42 AM    No results found for: HGBA1C  Lab Results  Component Value Date/Time   AST 22 02/12/2014 03:30 PM    Assessment and Plan  L4-L5 disc bulge/Chronic low back pain - Plan: patient to follow with  spine specialist, he is given prescription for back brace   Essential hypertension -  Plan: advised patient for DASH diet continue with atenolol (TENORMIN) 50 MG tablet   Right knee pain/arthralgia Pain medication when necessary   Return in about 3 months (around 03/25/2015), or if symptoms worsen or fail to improve.   This note has been created with Surveyor, quantity. Any transcriptional errors are unintentional.    Lorayne Marek, MD

## 2014-12-23 NOTE — Progress Notes (Signed)
Patient here for right knee pain Patient states the pain starts at his inner knee and radiates to his toes Patient states he has had this pain for about three weeks Patient presents with elevated blood pressure but has been out of his medications for over two weeks Patient would also like another prescription for a back brace as well

## 2014-12-26 ENCOUNTER — Encounter: Payer: Medicaid Other | Admitting: Cardiology

## 2015-01-21 ENCOUNTER — Other Ambulatory Visit: Payer: Self-pay | Admitting: Internal Medicine

## 2015-01-24 ENCOUNTER — Other Ambulatory Visit: Payer: Self-pay | Admitting: Internal Medicine

## 2015-01-26 NOTE — Telephone Encounter (Signed)
Patient called to request med refills on all of his current medication. Patient also stated that he is having "bad sinus problems, it is infected and flamed up real bad". Please f/u

## 2015-01-30 ENCOUNTER — Encounter: Payer: Medicaid Other | Admitting: Cardiology

## 2015-02-05 ENCOUNTER — Encounter: Payer: Self-pay | Admitting: Internal Medicine

## 2015-02-05 ENCOUNTER — Ambulatory Visit (HOSPITAL_COMMUNITY)
Admission: RE | Admit: 2015-02-05 | Discharge: 2015-02-05 | Disposition: A | Discharge status: Home / Self Care | Payer: Medicaid Other | Source: Ambulatory Visit | Attending: Internal Medicine | Admitting: Internal Medicine

## 2015-02-05 ENCOUNTER — Ambulatory Visit (HOSPITAL_BASED_OUTPATIENT_CLINIC_OR_DEPARTMENT_OTHER): Payer: Medicaid Other | Admitting: Internal Medicine

## 2015-02-05 ENCOUNTER — Telehealth: Payer: Self-pay

## 2015-02-05 VITALS — BP 138/88 | HR 76 | Temp 98.0°F | Resp 16 | Wt 267.0 lb

## 2015-02-05 DIAGNOSIS — M25561 Pain in right knee: Secondary | ICD-10-CM

## 2015-02-05 DIAGNOSIS — J320 Chronic maxillary sinusitis: Secondary | ICD-10-CM

## 2015-02-05 MED ORDER — AMOXICILLIN-POT CLAVULANATE 875-125 MG PO TABS: 1.0000 | ORAL_TABLET | Freq: Two times a day (BID) | ORAL | Status: DC

## 2015-02-05 NOTE — Progress Notes (Signed)
MRN: [id number] Name: Jason Stokes  Sex: male Age: 54 y.o. DOB: 03-11-1961  Allergies: Lisinopril  Chief Complaint  Patient presents with  . Sinusitis  . Knee Pain    right    HPI: Patient is 54 y.o. male who has history of hypertension, chronic back pain currently patient following up with neurology spine specialist, today is complaining of sinus congestion postnasal drip and productive cough for the last one to 2 weeks and patient has tried over-the-counter medication without much improvement denies any fever chills chest and shortness of breath, patient still smokes cigarettes, I have counseled patient to quit smoking, he's also complaining of on and off right knee pain worse for the last few weeks, denies any recent fall or trauma.  Past Medical History  Diagnosis Date  . Chronic back pain   . Hypertension   . Depression   . Small vessel disease     Right basal ganglia stroke  . History of alcohol abuse   . Sciatica neuralgia     Past Surgical History  Procedure Laterality Date  . Intercostal nerve block  2005  . Surgery back of head  1996-97      Medication List       This list is accurate as of: 02/05/15  2:41 PM.  Always use your most recent med list.               ABILIFY 15 MG tablet  Generic drug:  ARIPiprazole  Take 15 mg by mouth daily.     amoxicillin-clavulanate 875-125 MG per tablet  Commonly known as:  AUGMENTIN  Take 1 tablet by mouth 2 (two) times daily.     apixaban 5 MG Tabs tablet  Commonly known as:  ELIQUIS  Take 1 tablet (5 mg total) by mouth 2 (two) times daily.     atenolol 50 MG tablet  Commonly known as:  TENORMIN  TAKE 1 TABLET (50 MG TOTAL) BY MOUTH DAILY.     carbamazepine 200 MG tablet  Commonly known as:  TEGRETOL  Take 200 mg by mouth 2 (two) times daily.     carbamazepine 200 MG tablet  Commonly known as:  TEGRETOL  TAKE 1/2 TABLET TWICE A DAY FOR 2 WEEKS, THEN TAKE ONE TABLET TWICE A DAY     CYMBALTA 60 MG  capsule  Generic drug:  DULoxetine  Take 60 mg by mouth daily.     diltiazem 120 MG 24 hr capsule  Commonly known as:  CARDIZEM CD  Take 1 capsule (120 mg total) by mouth daily.     fluticasone 50 MCG/ACT nasal spray  Commonly known as:  FLONASE  Place 2 sprays into both nostrils daily.     gabapentin 600 MG tablet  Commonly known as:  NEURONTIN  Take 1 tablet (600 mg total) by mouth 3 (three) times daily.     HYDROcodone-acetaminophen 10-325 MG per tablet  Commonly known as:  NORCO  Take 1 tablet by mouth every 6 (six) hours as needed.     omeprazole 20 MG capsule  Commonly known as:  PRILOSEC  Take 1 capsule (20 mg total) by mouth daily.     oxyCODONE-acetaminophen 10-325 MG per tablet  Commonly known as:  PERCOCET  Take 1 tablet by mouth every 6 (six) hours as needed.     traZODone 100 MG tablet  Commonly known as:  DESYREL  Take 100 mg by mouth at bedtime.     UNABLE TO FIND  Back Brace   Dx chronic lower back  Pain  Dx lumbar disc bulge        Meds ordered this encounter  Medications  . amoxicillin-clavulanate (AUGMENTIN) 875-125 MG per tablet    Sig: Take 1 tablet by mouth 2 (two) times daily.    Dispense:  20 tablet    Refill:  0    Immunization History  Administered Date(s) Administered  . Influenza Split 05/09/2013  . Influenza,inj,Quad PF,36+ Mos 04/10/2014  . Pneumococcal Polysaccharide-23 05/28/2014    Family History  Problem Relation Age of Onset  . Heart disease Father   . Schizophrenia Sister     History  Substance Use Topics  . Smoking status: Current Some Day Smoker -- 0.30 packs/day    Types: Cigarettes  . Smokeless tobacco: Never Used     Comment: smoking 3 cigs/day  . Alcohol Use: No     Comment: History of alcohol abuse, marijuana use, cocaine use    Review of Systems   As noted in HPI  Filed Vitals:   02/05/15 1417  BP: 138/88  Pulse: 76  Temp: 98 F (36.7 C)  Resp: 16    Physical Exam  Physical Exam    Constitutional: No distress.  Eyes: EOM are normal. Pupils are equal, round, and reactive to light.  Cardiovascular: Normal rate.   Pulmonary/Chest: Breath sounds normal. No respiratory distress. He has no wheezes. He has no rales.  Musculoskeletal:  Right knee no erythema , tenderness anteriorly and laterally, crepitation+    CBC    Component Value Date/Time   WBC 4.6 10/29/2014 1643   WBC 4.4 02/12/2014 1530   RBC 5.60 10/29/2014 1643   RBC 5.57 02/12/2014 1530   HGB 13.9 10/29/2014 1643   HCT 44.3 10/29/2014 1643   PLT 151 10/29/2014 1643   MCV 79.1 10/29/2014 1643   LYMPHSABS 0.8 10/29/2014 1643   LYMPHSABS 1.5 02/12/2014 1530   MONOABS 0.6 10/29/2014 1643   EOSABS 0.1 10/29/2014 1643   EOSABS 0.2 02/12/2014 1530   BASOSABS 0.0 10/29/2014 1643   BASOSABS 0.0 02/12/2014 1530    CMP     Component Value Date/Time   NA 139 10/29/2014 1643   NA 140 02/12/2014 1530   K 3.9 10/29/2014 1643   CL 103 10/29/2014 1643   CO2 30 10/29/2014 1643   GLUCOSE 109* 10/29/2014 1643   GLUCOSE 88 02/12/2014 1530   BUN 8 10/29/2014 1643   BUN 14 02/12/2014 1530   CREATININE 1.33 10/29/2014 1643   CREATININE 1.26 10/04/2013 1102   CALCIUM 9.1 10/29/2014 1643   PROT 7.3 02/12/2014 1530   PROT 7.5 10/04/2013 1102   ALBUMIN 4.2 10/04/2013 1102   AST 22 02/12/2014 1530   ALT 25 02/12/2014 1530   ALKPHOS 83 02/12/2014 1530   BILITOT <0.2 02/12/2014 1530   GFRNONAA 60* 10/29/2014 1643   GFRNONAA 65 10/04/2013 1102   GFRAA 69* 10/29/2014 1643   GFRAA 75 10/04/2013 1102    Lab Results  Component Value Date/Time   CHOL 174 10/17/2013 10:42 AM    No results found for: HGBA1C  Lab Results  Component Value Date/Time   AST 22 02/12/2014 03:30 PM    Assessment and Plan  Chronic maxillary sinusitis - Plan: amoxicillin-clavulanate (AUGMENTIN) 875-125 MG per tablet, also advise patient to quit smoking.  Right knee pain - Plan: DG Knee Complete 4 Views Right   Return in about 3  months (around 05/08/2015), or if symptoms worsen or fail to  improve.   This note has been created with Surveyor, quantity. Any transcriptional errors are unintentional.    Lorayne Marek, MD

## 2015-02-05 NOTE — Telephone Encounter (Signed)
Patient is aware of his x ray results

## 2015-02-05 NOTE — Progress Notes (Signed)
Patient complains of having right knee pain Patient also complains of having sinus pressure and green  Mucous and cough

## 2015-02-05 NOTE — Telephone Encounter (Signed)
-----   Message from Lorayne Marek, MD sent at 02/05/2015  4:42 PM EDT ----- Call and let the patient know that his x-ray is reported to be normal.  IMPRESSION: No acute bony or joint abnormality. Exam stable from 06/02/2011.

## 2015-02-25 ENCOUNTER — Telehealth: Payer: Self-pay

## 2015-02-25 DIAGNOSIS — I10 Essential (primary) hypertension: Secondary | ICD-10-CM

## 2015-02-25 MED ORDER — ATENOLOL 50 MG PO TABS: ORAL_TABLET | ORAL | Status: DC

## 2015-02-25 MED ORDER — OMEPRAZOLE 20 MG PO CPDR: 20.0000 mg | DELAYED_RELEASE_CAPSULE | Freq: Every day | ORAL | Status: DC

## 2015-02-25 NOTE — Telephone Encounter (Signed)
Returned patient phone call Patient requesting a refill on his blood pressure medication and omeprazole [facility]

## 2015-03-03 ENCOUNTER — Telehealth: Payer: Self-pay | Admitting: Internal Medicine

## 2015-03-03 NOTE — Telephone Encounter (Signed)
Pt is stating that he can barely walk because of his knee pain and is wondering the status of his knee specialist referral. Patient has Medicaid. Please follow up with pt. Thank you.

## 2015-03-03 NOTE — Telephone Encounter (Signed)
His Referral was sent 02-24-15 to [facility] ph # [phone] it takes 10 business days for physician to review Jason Stokes spoke to patient and told him   Thank you

## 2015-03-12 ENCOUNTER — Encounter: Payer: Self-pay | Admitting: Family Medicine

## 2015-03-12 ENCOUNTER — Ambulatory Visit: Payer: Medicaid Other | Attending: Family Medicine | Admitting: Family Medicine

## 2015-03-12 VITALS — BP 158/97 | HR 74 | Temp 98.7°F | Resp 18 | Ht 73.0 in | Wt 273.0 lb

## 2015-03-12 DIAGNOSIS — M25561 Pain in right knee: Secondary | ICD-10-CM | POA: Diagnosis not present

## 2015-03-12 DIAGNOSIS — G8929 Other chronic pain: Secondary | ICD-10-CM | POA: Insufficient documentation

## 2015-03-12 MED ORDER — ACETAMINOPHEN-CODEINE #3 300-30 MG PO TABS: 1.0000 | ORAL_TABLET | Freq: Three times a day (TID) | ORAL | Status: DC | PRN

## 2015-03-12 NOTE — Progress Notes (Signed)
   Subjective:    Patient ID: Jason Stokes, male    DOB: 1961/06/08, 54 y.o.   MRN: [id number] CC: chronic R knee pain  HPI 54 yo M with chronic low back and R knee pain  1. Chronic R knee pain: since 2012. Has a fall in 2012 which exacerbated pain. Pain is medial knee with mild swelling. Also has chronic low back pain. Requesting narcotics. Requesting MRI of R knee. Had normal knee x-ray last month. Walks with a cane. Reports locking, popping and giving out of R knee.   Social History  Substance Use Topics  . Smoking status: Current Some Day Smoker -- 0.30 packs/day    Types: Cigarettes  . Smokeless tobacco: Never Used     Comment: smoking 3 cigs/day  . Alcohol Use: No     Comment: History of alcohol abuse, marijuana use, cocaine use    Review of Systems  Constitutional: Negative for fever and chills.  Respiratory: Negative for cough and shortness of breath.   Cardiovascular: Negative for chest pain, palpitations and leg swelling.  Gastrointestinal: Negative for nausea, vomiting, abdominal pain, diarrhea, constipation and blood in stool.  Musculoskeletal: Positive for myalgias, back pain, joint swelling and arthralgias. Negative for gait problem and neck pain.  Skin: Negative for rash.  Allergic/Immunologic: Negative for immunocompromised state.  Hematological: Negative for adenopathy. Does not bruise/bleed easily.  Psychiatric/Behavioral: Negative for suicidal ideas and dysphoric mood. The patient is not nervous/anxious.       Objective:   Physical Exam  Constitutional: He appears well-developed and well-nourished. No distress.  Pulmonary/Chest: Effort normal.  Musculoskeletal: He exhibits edema and tenderness.       Legs: Neurological: He is alert.  Skin: Skin is warm and dry. No rash noted. No erythema.  Psychiatric: He has a normal mood and affect.          Assessment & Plan:

## 2015-03-12 NOTE — Assessment & Plan Note (Signed)
1. Chronic R knee pain with swelling MRI ordered Tylenol #3 for pain control Keep appt with pain management   F/u in 3-4 weeks to review MRI of R knee

## 2015-03-12 NOTE — Patient Instructions (Signed)
Jason Stokes,  Thank you for coming in today. It was a pleasure meeting you. I look forward to being your primary doctor.   1. Chronic R knee pain with swelling MRI ordered Tylenol #3 for pain control Keep appt with pain management   F/u in 3-4 weeks to review MRI of R knee  Dr. Adrian Blackwater

## 2015-03-12 NOTE — Progress Notes (Signed)
Establish Care with PCP Complaining of pain on rt leg x 2 month no hx injury  Pain scale #10  Requesting MRI referral  Hx tobacco-1 cigarette per day

## 2015-03-17 ENCOUNTER — Ambulatory Visit (HOSPITAL_COMMUNITY)
Admission: RE | Admit: 2015-03-17 | Discharge: 2015-03-17 | Disposition: A | Discharge status: Home / Self Care | Payer: Medicaid Other | Source: Ambulatory Visit | Attending: Family Medicine | Admitting: Family Medicine

## 2015-03-17 DIAGNOSIS — G8929 Other chronic pain: Secondary | ICD-10-CM

## 2015-03-17 DIAGNOSIS — M238X1 Other internal derangements of right knee: Secondary | ICD-10-CM | POA: Insufficient documentation

## 2015-03-17 DIAGNOSIS — M659 Synovitis and tenosynovitis, unspecified: Secondary | ICD-10-CM | POA: Insufficient documentation

## 2015-03-17 DIAGNOSIS — M25461 Effusion, right knee: Secondary | ICD-10-CM | POA: Diagnosis not present

## 2015-03-17 DIAGNOSIS — M25561 Pain in right knee: Secondary | ICD-10-CM

## 2015-03-17 DIAGNOSIS — S83241A Other tear of medial meniscus, current injury, right knee, initial encounter: Secondary | ICD-10-CM | POA: Insufficient documentation

## 2015-03-17 DIAGNOSIS — M94261 Chondromalacia, right knee: Secondary | ICD-10-CM | POA: Insufficient documentation

## 2015-03-18 ENCOUNTER — Other Ambulatory Visit: Payer: Self-pay | Admitting: Family Medicine

## 2015-03-18 ENCOUNTER — Telehealth: Payer: Self-pay | Admitting: Family Medicine

## 2015-03-18 DIAGNOSIS — S83241S Other tear of medial meniscus, current injury, right knee, sequela: Secondary | ICD-10-CM

## 2015-03-18 DIAGNOSIS — S83241A Other tear of medial meniscus, current injury, right knee, initial encounter: Secondary | ICD-10-CM | POA: Insufficient documentation

## 2015-03-18 NOTE — Telephone Encounter (Signed)
Patient called to request his MRI results, please f/u with pt.

## 2015-03-19 NOTE — Telephone Encounter (Signed)
-----   Message from Boykin Nearing, MD sent at 03/18/2015  9:24 AM EDT ----- Multiple findings on MRI of R knee, most significant is tear of medial meniscus Ortho referral placed

## 2015-03-19 NOTE — Telephone Encounter (Signed)
Date of Birth by pt MRI results given  Notified Ortho office will call with appointment information

## 2015-03-20 ENCOUNTER — Telehealth: Payer: Self-pay | Admitting: Family Medicine

## 2015-03-20 NOTE — Telephone Encounter (Signed)
Patient called requesting to speak nurse regarding results, please f/u

## 2015-03-22 ENCOUNTER — Emergency Department (HOSPITAL_COMMUNITY)
Admission: EM | Admit: 2015-03-22 | Discharge: 2015-03-22 | Disposition: A | Discharge status: Home / Self Care | Payer: Medicaid Other | Attending: Emergency Medicine | Admitting: Emergency Medicine

## 2015-03-22 ENCOUNTER — Encounter (HOSPITAL_COMMUNITY): Payer: Self-pay

## 2015-03-22 DIAGNOSIS — X58XXXD Exposure to other specified factors, subsequent encounter: Secondary | ICD-10-CM | POA: Diagnosis not present

## 2015-03-22 DIAGNOSIS — M25561 Pain in right knee: Secondary | ICD-10-CM

## 2015-03-22 DIAGNOSIS — Z720 Tobacco use: Secondary | ICD-10-CM | POA: Insufficient documentation

## 2015-03-22 DIAGNOSIS — Z7951 Long term (current) use of inhaled steroids: Secondary | ICD-10-CM | POA: Diagnosis not present

## 2015-03-22 DIAGNOSIS — I10 Essential (primary) hypertension: Secondary | ICD-10-CM | POA: Insufficient documentation

## 2015-03-22 DIAGNOSIS — Z79899 Other long term (current) drug therapy: Secondary | ICD-10-CM | POA: Insufficient documentation

## 2015-03-22 DIAGNOSIS — S83241D Other tear of medial meniscus, current injury, right knee, subsequent encounter: Secondary | ICD-10-CM | POA: Diagnosis not present

## 2015-03-22 DIAGNOSIS — F329 Major depressive disorder, single episode, unspecified: Secondary | ICD-10-CM | POA: Diagnosis not present

## 2015-03-22 DIAGNOSIS — G8929 Other chronic pain: Secondary | ICD-10-CM | POA: Insufficient documentation

## 2015-03-22 MED ORDER — HYDROCODONE-ACETAMINOPHEN 5-325 MG PO TABS
1.0000 | ORAL_TABLET | Freq: Once | ORAL | Status: AC
  Administered 2015-03-22: 1 via ORAL
  Filled 2015-03-22: qty 1

## 2015-03-22 MED ORDER — HYDROCODONE-ACETAMINOPHEN 5-325 MG PO TABS: 1.0000 | ORAL_TABLET | Freq: Four times a day (QID) | ORAL | Status: DC | PRN

## 2015-03-22 NOTE — ED Provider Notes (Signed)
CSN: [id number]     Arrival date & time 03/22/15  0840 History   First MD Initiated Contact with Patient 03/22/15 [phone]     Chief Complaint  Patient presents with  . Knee Pain     (Consider location/radiation/quality/duration/timing/severity/associated sxs/prior Treatment) HPI Comments: Jason Stokes is a 54 y.o. male with a PMHx of chronic back pain, HTN, R basal ganglia stroke, depression, sciatica, and alcohol abuse, who presents to the ED with complaints of 2 months of right knee pain. He was seen by his primary care doctor and had an MRI 5 days ago, which showed a medial meniscus tear, and he was referred to the orthopedist, he has an appointment on 9/19, but he is unsure of the name of his orthopedist. He reports the pain is 10/10 intermittent right knee pain which is sharp, radiating down the right leg, worse with walking or prolonged sitting, and unrelieved by Tylenol 3. He denies any fevers, chills, erythema or warmth, history of gout, chest pain, shortness breath, abd pain, N/V/D/C, hematuria, dysuria, myalgias, numbness, tingling, weakness, or skin changes.   He has seen pain clinic at [facility] 2 days ago as a consultation, but was not prescribed narcotics. States he has no opiate contract. Chart review reveals that they did a first consult visit, his UDS was + for opiates, and they area awaiting confirmatory testing. They didn't prescribe any narcotics per their notes. No drug contract was mentioned in the note.  Patient is a 54 y.o. male presenting with knee pain. The history is provided by the patient. No language interpreter was used.  Knee Pain Location:  Knee Time since incident:  2 months Knee location:  R knee Pain details:    Quality:  Sharp   Radiates to:  R leg   Severity:  Severe   Onset quality:  Gradual   Duration:  2 months   Timing:  Intermittent   Progression:  Unchanged Chronicity:  Chronic Prior injury to area:  No Relieved by:  Nothing Exacerbated by:  prolonged sitting, or walking. Ineffective treatments:  Acetaminophen Associated symptoms: no decreased ROM, no fever, no muscle weakness, no numbness, no swelling and no tingling     Past Medical History  Diagnosis Date  . Chronic back pain   . Hypertension   . Depression   . Small vessel disease     Right basal ganglia stroke  . History of alcohol abuse   . Sciatica neuralgia    Past Surgical History  Procedure Laterality Date  . Intercostal nerve block  2005  . Surgery back of head  1996-97   Family History  Problem Relation Age of Onset  . Heart disease Father   . Schizophrenia Sister    Social History  Substance Use Topics  . Smoking status: Current Some Day Smoker -- 0.30 packs/day    Types: Cigarettes  . Smokeless tobacco: Never Used     Comment: smoking 3 cigs/day  . Alcohol Use: No     Comment: History of alcohol abuse, marijuana use, cocaine use    Review of Systems  Constitutional: Negative for fever and chills.  Respiratory: Negative for shortness of breath.   Cardiovascular: Negative for chest pain.  Gastrointestinal: Negative for nausea, vomiting, abdominal pain, diarrhea and constipation.  Genitourinary: Negative for dysuria and hematuria.  Musculoskeletal: Positive for arthralgias (R knee). Negative for myalgias and joint swelling.  Skin: Negative for color change.  Allergic/Immunologic: Negative for immunocompromised state.  Neurological: Negative for weakness and  numbness.  Psychiatric/Behavioral: Negative for confusion.   10 Systems reviewed and are negative for acute change except as noted in the HPI.    Allergies  Lisinopril  Home Medications   Prior to Admission medications   Medication Sig Start Date End Date Taking? Authorizing Provider  ABILIFY 15 MG tablet Take 15 mg by mouth daily. 04/30/14   Historical Provider, MD  acetaminophen-codeine (TYLENOL #3) 300-30 MG per tablet Take 1-2 tablets by mouth every 8 (eight) hours as needed for  moderate pain. 03/12/15   Boykin Nearing, MD  apixaban (ELIQUIS) 5 MG TABS tablet Take 1 tablet (5 mg total) by mouth 2 (two) times daily. 10/29/14   Sherwood Gambler, MD  atenolol (TENORMIN) 50 MG tablet TAKE 1 TABLET (50 MG TOTAL) BY MOUTH DAILY. 02/25/15   Tresa Garter, MD  carbamazepine (TEGRETOL) 200 MG tablet Take 200 mg by mouth 2 (two) times daily. 07/05/13   Kathrynn Ducking, MD  CYMBALTA 60 MG capsule Take 60 mg by mouth daily. 04/29/14   Historical Provider, MD  diltiazem (CARDIZEM CD) 120 MG 24 hr capsule Take 1 capsule (120 mg total) by mouth daily. 10/29/14   Sherwood Gambler, MD  fluticasone (FLONASE) 50 MCG/ACT nasal spray Place 2 sprays into both nostrils daily. 10/29/14   Sherwood Gambler, MD  gabapentin (NEURONTIN) 600 MG tablet Take 1 tablet (600 mg total) by mouth 3 (three) times daily. 02/18/14   Kathrynn Ducking, MD  omeprazole (PRILOSEC) 20 MG capsule Take 1 capsule (20 mg total) by mouth daily. 02/25/15   Tresa Garter, MD  traZODone (DESYREL) 100 MG tablet Take 100 mg by mouth at bedtime. 03/10/14   Historical Provider, MD   BP 142/94 mmHg  Pulse 63  Temp(Src) 97.4 F (36.3 C) (Oral)  Resp 16  SpO2 94% Physical Exam  Constitutional: He is oriented to person, place, and time. Vital signs are normal. He appears well-developed and well-nourished.  Non-toxic appearance. No distress.  Afebrile, nontoxic, NAD  HENT:  Head: Normocephalic and atraumatic.  Mouth/Throat: Oropharynx is clear and moist and mucous membranes are normal.  Eyes: Conjunctivae and EOM are normal. Right eye exhibits no discharge. Left eye exhibits no discharge.  Neck: Normal range of motion. Neck supple.  Cardiovascular: Normal rate, regular rhythm, normal heart sounds and intact distal pulses.  Exam reveals no gallop and no friction rub.   No murmur heard. Pulmonary/Chest: Effort normal and breath sounds normal. No respiratory distress. He has no decreased breath sounds. He has no wheezes. He has no  rhonchi. He has no rales.  Abdominal: Soft. Normal appearance and bowel sounds are normal. He exhibits no distension. There is no tenderness. There is no rigidity, no rebound and no guarding.  Musculoskeletal: Normal range of motion.       Right knee: He exhibits effusion (very slight). He exhibits normal range of motion, no swelling, no erythema, normal alignment, no LCL laxity, normal patellar mobility and no MCL laxity. Tenderness found. Medial joint line tenderness noted.       Legs: R knee with full passive ROM intact, somewhat limited AROM due to pain, with mild medial joint line TTP, no swelling/deformity, slight trace effusion, no bruising/erythema/warmth, no abnormal alignment or patellar mobility, no varus/valgus laxity, neg anterior drawer test, no crepitus.  Strength and sensation grossly intact, distal pulses intact.   Neurological: He is alert and oriented to person, place, and time. He has normal strength. No sensory deficit.  Skin: Skin is warm, dry  and intact. No rash noted.  Psychiatric: He has a normal mood and affect.  Nursing note and vitals reviewed.   ED Course  Procedures (including critical care time) Labs Review Labs Reviewed - No data to display  Imaging Review No results found. I have personally reviewed and evaluated these images and lab results as part of my medical decision-making.  Mr Knee Right Wo Contrast  03/18/2015   CLINICAL DATA:  Chronic right knee pain since 2012. Medial knee pain and swelling. Locking and popping with instability.  EXAM: MRI OF THE RIGHT KNEE WITHOUT CONTRAST  TECHNIQUE: Multiplanar, multisequence MR imaging of the knee was performed. No intravenous contrast was administered.  COMPARISON:  02/05/2015  FINDINGS: MENISCI  Medial meniscus: Radial tear of the posterior horn medial meniscus, images 30-31 of series 6, with adjacent horizontal grade 3 signal further laterally in the posterior horn, and amorphous abnormal internal signal in the  midbody. Also on the midbody there is some superior surface oblique tearing shown on image 23 series 7.  Lateral meniscus:  Unremarkable  LIGAMENTS  Cruciates: Slightly thickened ACL without overt tear. Proximal PCL degeneration.  Collaterals: Abnormal thickened MCL with some central ligamentous irregularity on images 28-29 of series 9, and adjacent edema, favoring grade 2 sprain or partial tear.  CARTILAGE  Patellofemoral: Mild patellar chondromalacia. Mild chondral thinning in the femoral trochlear groove. Marginal spurring.  Medial:  Moderate to severe degenerative chondral thinning.  Lateral: Moderate degenerative chondral thinning. 0.9 by 0.4 cm partial thickness focal chondral defect posteriorly along the lateral femoral condyle, image 16 series 7 and image 12 series 6. Mild marginal spurring.  Joint: Large knee effusion with synovitis. Superior plica. Filling defects above and below the posterior horn lateral meniscus probably from synovitis, less likely free chondral fragments. Apparent filling defects along the popliteus recess, again likely synovitis and less likely debris/ chondral fragments.  Popliteal Fossa: Low-level edema tracks along popliteal fascia planes. Edema tracks in the medial head gastrocnemius muscle.  Extensor Mechanism: Prepatellar subcutaneous edema. Subtle linear increased signal in the distal quadriceps tendon is likely incidental but in the appropriate clinical setting could represent a quadriceps sprain.  Bones: No significant extra-articular osseous abnormalities identified.  IMPRESSION: 1. Complex tear posterior horn medial meniscus with radial component and adjacent horizontal and oblique components. 2. Grade 2 sprain or partial tear of the MCL. 3. Varying degrees of chondromalacia and degenerative chondral thinning as described above. Focal chondral defect posteriorly along the lateral femoral condyle. 4. PCL degeneration and likely ACL degeneration. No cruciate ligament tear. 5.  Large knee effusion with synovitis. Filling defects posteriorly in the knee and in the popliteus recess probably from synovitis, less likely free chondral fragments or debris. 6. Edema in the medial head gastrocnemius possible from muscle strain. Low-level edema tracks along the popliteal fascia planes. 7. Subtle linear increased signal in the distal quadriceps tendon is likely incidental but in the appropriate clinical setting could represent a quadriceps sprain. Prepatellar subcutaneous edema.   Electronically Signed   By: Van Clines M.D.   On: 03/18/2015 08:34   02/05/15: RIGHT KNEE - COMPLETE 4+ VIEW  COMPARISON: None.  FINDINGS: No acute bony or joint abnormality identified. No evidence of fracture or dislocation.  IMPRESSION: No acute bony or joint abnormality. Exam stable from 06/02/2011.   Electronically Signed  By: Marcello Moores Register  On: 02/05/2015 15:28    EKG Interpretation None      MDM   Final diagnoses:  Chronic knee pain, right  Tear of medial meniscus of right knee, subsequent encounter    54 y.o. male here with R knee pain x2 months. Has already had xray which was neg, and MRI which showed multiple findings but most significantly medial meniscus tear with partial tear of MCL. Extremities NVI with soft compartments, pt ambulatory with crutches. No erythema or warmth, doubt gout/septic joint. Has seen pain management at [facility] 2 days ago, was drug screened and they're awaiting confirmatory testing, but did not give him narcotics that day. NCDB reveals tylenol #3 on 03/12/15, and norco on 01/27/15 which is consistent with his reports. Given this information, will give him small supply of pain medications here (no narcotic contract on file) but discussed that he'll need to f/up with pain management for further prescriptions. Pt already has ortho referral. Discussed ice/elevation. I explained the diagnosis and have given explicit precautions to return to the ER  including for any other new or worsening symptoms. The patient understands and accepts the medical plan as it's been dictated and I have answered their questions. Discharge instructions concerning home care and prescriptions have been given. The patient is STABLE and is discharged to home in good condition.  BP 142/94 mmHg  Pulse 63  Temp(Src) 97.4 F (36.3 C) (Oral)  Resp 16  SpO2 94%  Meds ordered this encounter  Medications  . HYDROcodone-acetaminophen (NORCO/VICODIN) 5-325 MG per tablet 1 tablet    Sig:   . HYDROcodone-acetaminophen (NORCO) 5-325 MG per tablet    Sig: Take 1 tablet by mouth every 6 (six) hours as needed for severe pain.    Dispense:  10 tablet    Refill:  0    Order Specific Question:  Supervising Provider    Answer:  Noemi Chapel [[id number]]       Zian Delair Camprubi-Soms, PA-C 03/22/15 0930  Daleen Bo, MD 03/22/15 [phone]

## 2015-03-22 NOTE — ED Notes (Signed)
He c/o non-traumatic right knee pain.  He states it feels "swollen and hot"; although it is essentially normal in appearance.  He is in no distress.

## 2015-03-22 NOTE — Discharge Instructions (Signed)
You have a meniscus tear in your right knee, along with a sprain of some of the ligaments in your knee. You will need to follow up with the orthopedist at your scheduled appointment on 04/06/15. Use ice and elevation to help with pain/swelling. Use ibuprofen or norco as directed as needed for pain, but don't drive or operate machinery while taking norco. You must follow up with the pain clinic you're seeing for any further opiate prescriptions. Return to the ER for changes or worsening symptoms.   Knee Pain The knee is the complex joint between your thigh and your lower leg. It is made up of bones, tendons, ligaments, and cartilage. The bones that make up the knee are:  The femur in the thigh.  The tibia and fibula in the lower leg.  The patella or kneecap riding in the groove on the lower femur. CAUSES  Knee pain is a common complaint with many causes. A few of these causes are:  Injury, such as:  A ruptured ligament or tendon injury.  Torn cartilage.  Medical conditions, such as:  Gout  Arthritis  Infections  Overuse, over training, or overdoing a physical activity. Knee pain can be minor or severe. Knee pain can accompany debilitating injury. Minor knee problems often respond well to self-care measures or get well on their own. More serious injuries may need medical intervention or even surgery. SYMPTOMS The knee is complex. Symptoms of knee problems can vary widely. Some of the problems are:  Pain with movement and weight bearing.  Swelling and tenderness.  Buckling of the knee.  Inability to straighten or extend your knee.  Your knee locks and you cannot straighten it.  Warmth and redness with pain and fever.  Deformity or dislocation of the kneecap. DIAGNOSIS  Determining what is wrong may be very straight forward such as when there is an injury. It can also be challenging because of the complexity of the knee. Tests to make a diagnosis may include:  Your  caregiver taking a history and doing a physical exam.  Routine X-rays can be used to rule out other problems. X-rays will not reveal a cartilage tear. Some injuries of the knee can be diagnosed by:  Arthroscopy a surgical technique by which a small video camera is inserted through tiny incisions on the sides of the knee. This procedure is used to examine and repair internal knee joint problems. Tiny instruments can be used during arthroscopy to repair the torn knee cartilage (meniscus).  Arthrography is a radiology technique. A contrast liquid is directly injected into the knee joint. Internal structures of the knee joint then become visible on X-ray film.  An MRI scan is a non X-ray radiology procedure in which magnetic fields and a computer produce two- or three-dimensional images of the inside of the knee. Cartilage tears are often visible using an MRI scanner. MRI scans have largely replaced arthrography in diagnosing cartilage tears of the knee.  Blood work.  Examination of the fluid that helps to lubricate the knee joint (synovial fluid). This is done by taking a sample out using a needle and a syringe. TREATMENT The treatment of knee problems depends on the cause. Some of these treatments are:  Depending on the injury, proper casting, splinting, surgery, or physical therapy care will be needed.  Give yourself adequate recovery time. Do not overuse your joints. If you begin to get sore during workout routines, back off. Slow down or do fewer repetitions.  For repetitive activities  such as cycling or running, maintain your strength and nutrition.  Alternate muscle groups. For example, if you are a weight lifter, work the upper body on one day and the lower body the next.  Either tight or weak muscles do not give the proper support for your knee. Tight or weak muscles do not absorb the stress placed on the knee joint. Keep the muscles surrounding the knee strong.  Take care of  mechanical problems.  If you have flat feet, orthotics or special shoes may help. See your caregiver if you need help.  Arch supports, sometimes with wedges on the inner or outer aspect of the heel, can help. These can shift pressure away from the side of the knee most bothered by osteoarthritis.  A brace called an "unloader" brace also may be used to help ease the pressure on the most arthritic side of the knee.  If your caregiver has prescribed crutches, braces, wraps or ice, use as directed. The acronym for this is PRICE. This means protection, rest, ice, compression, and elevation.  Nonsteroidal anti-inflammatory drugs (NSAIDs), can help relieve pain. But if taken immediately after an injury, they may actually increase swelling. Take NSAIDs with food in your stomach. Stop them if you develop stomach problems. Do not take these if you have a history of ulcers, stomach pain, or bleeding from the bowel. Do not take without your caregiver's approval if you have problems with fluid retention, heart failure, or kidney problems.  For ongoing knee problems, physical therapy may be helpful.  Glucosamine and chondroitin are over-the-counter dietary supplements. Both may help relieve the pain of osteoarthritis in the knee. These medicines are different from the usual anti-inflammatory drugs. Glucosamine may decrease the rate of cartilage destruction.  Injections of a corticosteroid drug into your knee joint may help reduce the symptoms of an arthritis flare-up. They may provide pain relief that lasts a few months. You may have to wait a few months between injections. The injections do have a small increased risk of infection, water retention, and elevated blood sugar levels.  Hyaluronic acid injected into damaged joints may ease pain and provide lubrication. These injections may work by reducing inflammation. A series of shots may give relief for as long as 6 months.  Topical painkillers. Applying  certain ointments to your skin may help relieve the pain and stiffness of osteoarthritis. Ask your pharmacist for suggestions. Many over the-counter products are approved for temporary relief of arthritis pain.  In some countries, doctors often prescribe topical NSAIDs for relief of chronic conditions such as arthritis and tendinitis. A review of treatment with NSAID creams found that they worked as well as oral medications but without the serious side effects. PREVENTION  Maintain a healthy weight. Extra pounds put more strain on your joints.  Get strong, stay limber. Weak muscles are a common cause of knee injuries. Stretching is important. Include flexibility exercises in your workouts.  Be smart about exercise. If you have osteoarthritis, chronic knee pain or recurring injuries, you may need to change the way you exercise. This does not mean you have to stop being active. If your knees ache after jogging or playing basketball, consider switching to swimming, water aerobics, or other low-impact activities, at least for a few days a week. Sometimes limiting high-impact activities will provide relief.  Make sure your shoes fit well. Choose footwear that is right for your sport.  Protect your knees. Use the proper gear for knee-sensitive activities. Use kneepads when playing  volleyball or laying carpet. Buckle your seat belt every time you drive. Most shattered kneecaps occur in car accidents.  Rest when you are tired. SEEK MEDICAL CARE IF:  You have knee pain that is continual and does not seem to be getting better.  SEEK IMMEDIATE MEDICAL CARE IF:  Your knee joint feels hot to the touch and you have a high fever. MAKE SURE YOU:   Understand these instructions.  Will watch your condition.  Will get help right away if you are not doing well or get worse. Document Released: 05/01/2007 Document Revised: 09/26/2011 Document Reviewed: 05/01/2007 [facility] Patient Information 2015 [place],  [place]. This information is not intended to replace advice given to you by your health care provider. Make sure you discuss any questions you have with your health care provider.  Knee, Cartilage and its Function The menisci are made of tough cartilage, and fit between the surfaces of the bones upon which they rest. The menisci are semi lunar (C-shaped) and have a wedged profile. The wedged profile helps the stability of the joint by keeping the rounded femur surface from sliding off the flat tibial surface. The menisci are nourished (fed) by small blood vessels; but there is also a large area in the center of the meniscus that does not have a good blood supply (avascular). This presents a problem when there is an injury to the meniscus, as areas without good blood supply heal poorly. When there is a torn cartilage in the knee, surgery is often required to fix this. This is usually done with an arthroscopy (a surgical procedure less invasive than open surgery). Some times open surgery of the knee is required if there is other damage which has occurred. The medial meniscus rests on the medial tibial plateau. The tibia is the large bone in your lower leg (the shin bone). The medial tibial plateau is the upper end of the bone making up the inner part of your knee. The lateral meniscus serves the same purpose and is located on the outside of the knee. The menisci help to distribute your body weight across the knee joint. Without the meniscus present, the weight of your body would be unevenly applied to the bones in your legs (the femur and tibia). The femur is the large bone in your thigh. This uneven weight distribution would cause increased wear and tear on the cartilage covering the bones, leading to early damage of these areas. The presence of the menisci cartilage is necessary for a healthy knee. PURPOSE OF THE KNEE CARTILAGE The knee joint is made up of three bones: the thigh bone (femur), the shin bone  (tibia), and the knee cap (patella). The surfaces of these bones at the knee joint are covered with cartilage. This smooth, slippery surface allows the bones to slide against each other without causing bone damage. The meniscus sits between these cartilaginous surfaces of the bones (femur and tibia). It distributes the weight evenly in the joints and helps with the stability of the joint (keeps the joint steady). HOME CARE INSTRUCTIONS After surgery:  Use crutches as instructed.  Once home, an ice pack applied to your injury may help with discomfort and keep the swelling down. An ice pack can be used for 15-20 minutes 03-04 times per day for the first 2-3 days or as instructed.  Only take over-the-counter or prescription medicines for pain, discomfort, or fever as directed by your caregiver.  Call if you do not have relief of pain with  medications or if there is an increase in pain.  Call if your foot becomes cold or blue.  Call if your knee gets stuck in a fixed position and will not move.  You may resume normal diet and activities as directed.  Make sure to keep your appointment with your follow-up caregiver. This injury may require further evaluation and treatment beyond the treatment you received today. MAKE SURE YOU:   Understand these instructions.  Will watch your condition.  Will get help right away if you are not doing well or get worse. Document Released: 12/24/2001 Document Revised: 09/26/2011 Document Reviewed: 05/06/2009 [facility] Patient Information 2015 [place], [place]. This information is not intended to replace advice given to you by your health care provider. Make sure you discuss any questions you have with your health care provider.  Meniscus Tear with Phase I Rehab The meniscus is a C-shaped cartilage structure, located in the knee joint between the thigh bone (femur) and the shinbone (tibia). Two menisci are located in each knee joint: the inner and outer meniscus.  The meniscus acts as an adapter between the thigh bone and shinbone, allowing them to fit properly together. It also functions as a shock absorber, to reduce the stress placed on the knee joint and to help supply nutrients to the knee joint cartilage. As people age, the meniscus begins to harden and become more vulnerable to injury. Meniscus tears are a common injury, especially in older athletes. Inner meniscus tears are more common than outer meniscus tears.  SYMPTOMS   Pain in the knee, especially with standing or squatting with the affected leg.  Tenderness along the joint line.  Swelling in the knee joint (effusion), usually starting 1 to 2 days after injury.  Locking or catching of the knee joint, causing inability to straighten the knee completely.  Giving way or buckling of the knee. CAUSES  A meniscus tear occurs when a force is placed on the meniscus that is greater than it can handle. Common causes of injury include:  Direct hit (trauma) to the knee.  Twisting, pivoting, or cutting (rapidly changing direction while running), kneeling or squatting.  Without injury, due to aging. RISK INCREASES WITH:  Contact sports (football, rugby).  Sports in which cleats are used with pivoting (soccer, lacrosse) or sports in which good shoe grip and sudden change in direction are required (racquetball, basketball, squash).  Previous knee injury.  Associated knee injury, particularly ligament injuries.  Poor strength and flexibility. PREVENTION  Warm up and stretch properly before activity.  Maintain physical fitness:  Strength, flexibility, and endurance.  Cardiovascular fitness.  Protect the knee with a brace or elastic bandage.  Wear properly fitted protective equipment (proper cleats for the surface). PROGNOSIS  Sometimes, meniscus tears heal on their own. However, definitive treatment requires surgery, followed by at least 6 weeks of recovery.  RELATED COMPLICATIONS    Recurring symptoms that result in a chronic problem.  Repeated knee injury, especially if sports are resumed too soon after injury or surgery.  Progression of the tear (the tear gets larger), if untreated.  Arthritis of the knee in later years (with or without surgery).  Complications of surgery, including infection, bleeding, injury to nerves (numbness, weakness, paralysis) continued pain, giving way, locking, nonhealing of meniscus (if repaired), need for further surgery, and knee stiffness (loss of motion). TREATMENT  Treatment first involves the use of ice and medicine, to reduce pain and inflammation. You may find using crutches to walk more comfortable. However,  it is okay to bear weight on the injured knee, if the pain will allow it. Surgery is often advised as a definitive treatment. Surgery is performed through an incision near the joint (arthroscopically). The torn piece of the meniscus is removed, and if possible the joint cartilage is repaired. After surgery, the joint must be restrained. After restraint, it is important to perform strengthening and stretching exercises to help regain strength and a full range of motion. These exercises may be completed at home or with a therapist.  MEDICATION  If pain medicine is needed, nonsteroidal anti-inflammatory medicines (aspirin and ibuprofen), or other minor pain relievers (acetaminophen), are often advised.  Do not take pain medicine for 7 days before surgery.  Prescription pain relievers may be given, if your caregiver thinks they are needed. Use only as directed and only as much as you need. HEAT AND COLD  Cold treatment (icing) should be applied for 10 to 15 minutes every 2 to 3 hours for inflammation and pain, and immediately after activity that aggravates your symptoms. Use ice packs or an ice massage.  Heat treatment may be used before performing stretching and strengthening activities prescribed by your caregiver, physical  therapist, or athletic trainer. Use a heat pack or a warm water soak. SEEK MEDICAL CARE IF:   Symptoms get worse or do not improve in 2 weeks, despite treatment.  New, unexplained symptoms develop. (Drugs used in treatment may produce side effects.)  [employer] Patient Information 2015 [place]. This information is not intended to replace advice given to you by your health care provider. Make sure you discuss any questions you have with your health care provider.  Cryotherapy Cryotherapy means treatment with cold. Ice or gel packs can be used to reduce both pain and swelling. Ice is the most helpful within the first 24 to 48 hours after an injury or flare-up from overusing a muscle or joint. Sprains, strains, spasms, burning pain, shooting pain, and aches can all be eased with ice. Ice can also be used when recovering from surgery. Ice is effective, has very few side effects, and is safe for most people to use. PRECAUTIONS  Ice is not a safe treatment option for people with:  Raynaud phenomenon. This is a condition affecting small blood vessels in the extremities. Exposure to cold may cause your problems to return.  Cold hypersensitivity. There are many forms of cold hypersensitivity, including:  Cold urticaria. Red, itchy hives appear on the skin when the tissues begin to warm after being iced.  Cold erythema. This is a red, itchy rash caused by exposure to cold.  Cold hemoglobinuria. Red blood cells break down when the tissues begin to warm after being iced. The hemoglobin that carry oxygen are passed into the urine because they cannot combine with blood proteins fast enough.  Numbness or altered sensitivity in the area being iced. If you have any of the following conditions, do not use ice until you have discussed cryotherapy with your caregiver:  Heart conditions, such as arrhythmia, angina, or chronic heart disease.  High blood pressure.  Healing wounds or open skin in the  area being iced.  Current infections.  Rheumatoid arthritis.  Poor circulation.  Diabetes. Ice slows the blood flow in the region it is applied. This is beneficial when trying to stop inflamed tissues from spreading irritating chemicals to surrounding tissues. However, if you expose your skin to cold temperatures for too long or without the proper protection, you can damage your skin  or nerves. Watch for signs of skin damage due to cold. HOME CARE INSTRUCTIONS Follow these tips to use ice and cold packs safely.  Place a dry or damp towel between the ice and skin. A damp towel will cool the skin more quickly, so you may need to shorten the time that the ice is used.  For a more rapid response, add gentle compression to the ice.  Ice for no more than 10 to 20 minutes at a time. The bonier the area you are icing, the less time it will take to get the benefits of ice.  Check your skin after 5 minutes to make sure there are no signs of a poor response to cold or skin damage.  Rest 20 minutes or more between uses.  Once your skin is numb, you can end your treatment. You can test numbness by very lightly touching your skin. The touch should be so light that you do not see the skin dimple from the pressure of your fingertip. When using ice, most people will feel these normal sensations in this order: cold, burning, aching, and numbness.  Do not use ice on someone who cannot communicate their responses to pain, such as small children or people with dementia. HOW TO MAKE AN ICE PACK Ice packs are the most common way to use ice therapy. Other methods include ice massage, ice baths, and cryosprays. Muscle creams that cause a cold, tingly feeling do not offer the same benefits that ice offers and should not be used as a substitute unless recommended by your caregiver. To make an ice pack, do one of the following:  Place crushed ice or a bag of frozen vegetables in a sealable plastic bag. Squeeze out  the excess air. Place this bag inside another plastic bag. Slide the bag into a pillowcase or place a damp towel between your skin and the bag.  Mix 3 parts water with 1 part rubbing alcohol. Freeze the mixture in a sealable plastic bag. When you remove the mixture from the freezer, it will be slushy. Squeeze out the excess air. Place this bag inside another plastic bag. Slide the bag into a pillowcase or place a damp towel between your skin and the bag. SEEK MEDICAL CARE IF:  You develop white spots on your skin. This may give the skin a blotchy (mottled) appearance.  Your skin turns blue or pale.  Your skin becomes waxy or hard.  Your swelling gets worse. MAKE SURE YOU:   Understand these instructions.  Will watch your condition.  Will get help right away if you are not doing well or get worse. Document Released: 02/28/2011 Document Revised: 11/18/2013 Document Reviewed: 02/28/2011 [facility] Patient Information 2015 [place], [place]. This information is not intended to replace advice given to you by your health care provider. Make sure you discuss any questions you have with your health care provider.

## 2015-03-25 NOTE — Telephone Encounter (Signed)
Patient called [facility], left message on nurse's voicemail. Nurse called patient, patient verified date of birth and address. Patient explains he had called for MRI results but has recently went to ED and is aware of MRI results. Patient has no further questions at this time.

## 2015-03-27 ENCOUNTER — Encounter: Payer: Self-pay | Admitting: Cardiology

## 2015-03-27 ENCOUNTER — Ambulatory Visit (INDEPENDENT_AMBULATORY_CARE_PROVIDER_SITE_OTHER): Payer: Medicaid Other | Admitting: Cardiology

## 2015-03-27 VITALS — BP 141/91 | HR 94 | Ht 73.0 in | Wt 276.4 lb

## 2015-03-27 DIAGNOSIS — I480 Paroxysmal atrial fibrillation: Secondary | ICD-10-CM

## 2015-03-27 DIAGNOSIS — I10 Essential (primary) hypertension: Secondary | ICD-10-CM | POA: Diagnosis not present

## 2015-03-27 HISTORY — DX: Paroxysmal atrial fibrillation: I48.0

## 2015-03-27 LAB — BASIC METABOLIC PANEL
BUN: 12 mg/dL (ref 6–23)
CO2: 26 mEq/L (ref 19–32)
Calcium: 9.5 mg/dL (ref 8.4–10.5)
Chloride: 104 mEq/L (ref 96–112)
Creatinine, Ser: 1.2 mg/dL (ref 0.40–1.50)
GFR: 81.12 mL/min (ref >60.00)
Glucose, Bld: 131 mg/dL — ABNORMAL HIGH (ref 70–99)
Potassium: 3.6 mEq/L (ref 3.5–5.1)
Sodium: 137 mEq/L (ref 135–145)

## 2015-03-27 LAB — CBC WITH DIFFERENTIAL/PLATELET
Basophils Absolute: 0 10*3/uL (ref 0.0–0.1)
Basophils Relative: 0.4 % (ref 0.0–3.0)
Eosinophils Absolute: 0.1 10*3/uL (ref 0.0–0.7)
Eosinophils Relative: 2.2 % (ref 0.0–5.0)
HCT: 41 % (ref 39.0–52.0)
Hemoglobin: 14.4 g/dL (ref 13.0–17.0)
Lymphocytes Relative: 25.5 % (ref 12.0–46.0)
Lymphs Abs: 1.1 10*3/uL (ref 0.7–4.0)
MCHC: 35 g/dL (ref 30.0–36.0)
MCV: 78.6 fl (ref 78.0–100.0)
Monocytes Absolute: 0.2 10*3/uL (ref 0.1–1.0)
Monocytes Relative: 5.2 % (ref 3.0–12.0)
Neutro Abs: 3 10*3/uL (ref 1.4–7.7)
Neutrophils Relative %: 66.7 % (ref 43.0–77.0)
Platelets: 420 10*3/uL — ABNORMAL HIGH (ref 150.0–400.0)
RBC: 5.22 Mil/uL (ref 4.22–5.81)
RDW: 16.9 % — ABNORMAL HIGH (ref 11.5–15.5)
WBC: 4.5 10*3/uL (ref 4.0–10.5)

## 2015-03-27 NOTE — Patient Instructions (Signed)
Medication Instructions: - no changes  Labwork: - Your physician recommends that you have lab work today: BMP/ CBC  Procedures/Testing: - Your physician has requested that you have an echocardiogram. Echocardiography is a painless test that uses sound waves to create images of your heart. It provides your doctor with information about the size and shape of your heart and how well your heart's chambers and valves are working. This procedure takes approximately one hour. There are no restrictions for this procedure.  - Your physician has recommended that you wear an event monitor (30 day). Event monitors are medical devices that record the heart's electrical activity. Doctors most often [place] these monitors to diagnose arrhythmias. Arrhythmias are problems with the speed or rhythm of the heartbeat. The monitor is a small, portable device. You can wear one while you do your normal daily activities. This is usually used to diagnose what is causing palpitations/syncope (passing out).  Follow-Up: - Your physician wants you to follow-up in: 6 months with Dr. Radford Pax. You will receive a reminder letter in the mail two months in advance. If you don't receive a letter, please call our office to schedule the follow-up appointment.   Any Additional Special Instructions Will Be Listed Below (If Applicable).

## 2015-03-27 NOTE — Progress Notes (Signed)
Cardiology Office Note   Date:  03/27/2015   ID:  Jason Stokes, DOB 1960-08-29, MRN [id number]  PCP:  [name], MD    Chief Complaint  Patient presents with  . New Evaluation    Atrial fib      History of Present Illness: Jason Stokes is a 54 y.o. male who presents for evaluation of atrial fibrillation.  He was in [facility] ER in April of this year with palpitations and was diagnosed with atrial fibrillation with RVR.  He is chronically on BB for HTN and was started on Eliquis and Cardizem.  He was instructed to followup with Cardiology but never did.  He is now here today for further evaluation.  He continues to have palpitations intermittently.  He says that he had a brief episode of chest pressure and SOB right before the palpitations started but only lasted 5 minutes and has not had any further epidsodes.  He denies any dizziness, LE edema or syncope.      Past Medical History  Diagnosis Date  . Chronic back pain   . Hypertension   . Depression   . Small vessel disease     Right basal ganglia stroke  . History of alcohol abuse   . Sciatica neuralgia     Past Surgical History  Procedure Laterality Date  . Intercostal nerve block  2005  . Surgery back of head  1996-97     Current Outpatient Prescriptions  Medication Sig Dispense Refill  . ABILIFY 15 MG tablet Take 15 mg by mouth daily.  1  . acetaminophen-codeine (TYLENOL #3) 300-30 MG per tablet Take 1-2 tablets by mouth every 8 (eight) hours as needed for moderate pain. 60 tablet 0  . apixaban (ELIQUIS) 5 MG TABS tablet Take 1 tablet (5 mg total) by mouth 2 (two) times daily. 60 tablet 0  . atenolol (TENORMIN) 50 MG tablet TAKE 1 TABLET (50 MG TOTAL) BY MOUTH DAILY. 30 tablet 3  . carbamazepine (TEGRETOL) 200 MG tablet Take 200 mg by mouth 2 (two) times daily.    . CYMBALTA 60 MG capsule Take 60 mg by mouth daily.  1  . diltiazem (CARDIZEM CD) 120 MG 24 hr capsule Take 1 capsule (120 mg  total) by mouth daily. 30 capsule 0  . fluticasone (FLONASE) 50 MCG/ACT nasal spray Place 2 sprays into both nostrils daily. 16 g 0  . gabapentin (NEURONTIN) 600 MG tablet Take 1 tablet (600 mg total) by mouth 3 (three) times daily. 90 tablet 3  . HYDROcodone-acetaminophen (NORCO) 5-325 MG per tablet Take 1 tablet by mouth every 6 (six) hours as needed for severe pain. 10 tablet 0  . omeprazole (PRILOSEC) 20 MG capsule Take 1 capsule (20 mg total) by mouth daily. 30 capsule 2  . traZODone (DESYREL) 100 MG tablet Take 100 mg by mouth at bedtime.  1   No current facility-administered medications for this visit.    Allergies:   Lisinopril    Social History:  The patient  reports that he has been smoking Cigarettes.  He has been smoking about 0.30 packs per day. He has never used smokeless tobacco. He reports that he uses illicit drugs (Marijuana and Cocaine). He reports that he does not drink alcohol.   Family History:  The patient's family history includes Heart disease in his father; Schizophrenia in his sister.    ROS:  Please see the history of present illness.   Otherwise, review of systems are positive for none.   All other systems are reviewed and negative.    PHYSICAL EXAM: VS:  BP 141/91 mmHg  Pulse 94  Ht 6\' 1"  (1.854 m)  Wt 276 lb 6.4 oz (125.374 kg)  BMI 36.47 kg/m2  SpO2 96% , BMI Body mass index is 36.47 kg/(m^2). GEN: Well nourished, well developed, in no acute distress HEENT: normal Neck: no JVD, carotid bruits, or masses Cardiac: RRR; no murmurs, rubs, or gallops,no edema  Respiratory:  clear to auscultation bilaterally, normal work of breathing GI: soft, nontender, nondistended, + BS MS: no deformity or atrophy Skin: warm and dry, no rash Neuro:  Strength and sensation are intact Psych: euthymic mood, full affect   EKG:  EKG is ordered today. The ekg ordered today demonstrates NSR at 81bpm with no ST changes   Recent Labs: 10/29/2014: BUN 8; Creatinine, Ser  1.33; Hemoglobin 13.9; Platelets 151; Potassium 3.9; Sodium 139    Lipid Panel    Component Value Date/Time   CHOL 174 10/17/2013 1042   TRIG 180* 10/17/2013 1042   HDL 32* 10/17/2013 1042   CHOLHDL 5.4 10/17/2013 1042   VLDL 36 10/17/2013 1042   LDLCALC 106* 10/17/2013 1042      Wt Readings from Last 3 Encounters:  03/27/15 276 lb 6.4 oz (125.374 kg)  03/12/15 273 lb (123.832 kg)  02/05/15 267 lb (121.11 kg)        ASSESSMENT AND PLAN:  1.  Atrial fibrillation - he is now back in NSR.  He does still have some palpitations intermittently.  He will continue on Eliquis/BB and CCB. I will get a 30 day heart monitor to assess for breakthrough PAF.  Check 2D echo to assess LA size.  His CHADS2VASC score is 3. Check NOAC panel. 2.  HTN - borderline elevated but his knee is painful right now which may be driving his BP up.  Continue BB and CCB 3.  Remote CVA   Current medicines are reviewed at length with the patient today.  The patient does not have concerns regarding medicines.  The following changes have been made:  no change  Labs/ tests ordered today: See above Assessment and Plan No orders of the defined types were placed in this encounter.     Disposition:   FU with me in 6 months  Signed, Sueanne Margarita, MD  03/27/2015 11:47 AM    [facility] [place], [place], [place]  [address] Phone: [phone]; Fax: [phone]

## 2015-03-31 ENCOUNTER — Ambulatory Visit (INDEPENDENT_AMBULATORY_CARE_PROVIDER_SITE_OTHER): Payer: Medicaid Other

## 2015-03-31 ENCOUNTER — Other Ambulatory Visit: Payer: Self-pay

## 2015-03-31 ENCOUNTER — Ambulatory Visit (HOSPITAL_COMMUNITY): Payer: Medicaid Other | Attending: Cardiology

## 2015-03-31 DIAGNOSIS — G8929 Other chronic pain: Secondary | ICD-10-CM

## 2015-03-31 DIAGNOSIS — I4891 Unspecified atrial fibrillation: Secondary | ICD-10-CM | POA: Insufficient documentation

## 2015-03-31 DIAGNOSIS — I5189 Other ill-defined heart diseases: Secondary | ICD-10-CM | POA: Insufficient documentation

## 2015-03-31 DIAGNOSIS — I10 Essential (primary) hypertension: Secondary | ICD-10-CM | POA: Insufficient documentation

## 2015-03-31 DIAGNOSIS — I480 Paroxysmal atrial fibrillation: Secondary | ICD-10-CM

## 2015-03-31 DIAGNOSIS — I517 Cardiomegaly: Secondary | ICD-10-CM | POA: Insufficient documentation

## 2015-03-31 DIAGNOSIS — M545 Low back pain: Principal | ICD-10-CM

## 2015-04-09 ENCOUNTER — Telehealth: Payer: Self-pay | Admitting: Physician Assistant

## 2015-04-09 NOTE — Telephone Encounter (Signed)
Dr. Radford Pax placed the patient on 30 event monitor to check for recurrent a-fib, contacted by [facility] as pt went into afib this morning with HR 130s. I contacted the patient this morning, he states he always came out of afib, apparently he has good cardiac awareness for palpitation. He states he was in afib for roughly 30 this morning and now his HR is back down to normal.  He takes atenolol 50mg , diltiazem CD 120mg  daily and Eliquis. I discussed options with the patient, potentially if his SBP is ok, he can potentially take additional 120mg  diltiazem CD if he does stay in a-fib for long period of time.  Hilbert Corrigan PA Pager: [phone]

## 2015-04-13 ENCOUNTER — Telehealth: Payer: Self-pay | Admitting: Cardiology

## 2015-04-13 NOTE — Telephone Encounter (Signed)
Patient having breakthrough PAF on Atenolol and Diltiazem.  Please increase diltiazem to 180mg  daily and followup with extender in 2 weeks

## 2015-04-13 NOTE — Telephone Encounter (Signed)
Left message to call back  

## 2015-04-14 ENCOUNTER — Telehealth: Payer: Self-pay | Admitting: Cardiology

## 2015-04-14 ENCOUNTER — Encounter: Payer: Self-pay | Admitting: *Deleted

## 2015-04-14 MED ORDER — DILTIAZEM HCL ER COATED BEADS 180 MG PO CP24: 180.0000 mg | ORAL_CAPSULE | Freq: Every day | ORAL | Status: DC

## 2015-04-14 NOTE — Telephone Encounter (Signed)
Jason Stokes (MR# [id number])      Telephone Encounter Info    Author Note Status Last Update User Last Update Date/Time   Sueanne Margarita, MD Signed Sueanne Margarita, MD 04/13/2015 3:26 PM    Telephone Encounter    Expand All Collapse All   Patient having breakthrough PAF on Atenolol and Diltiazem. Please increase diltiazem to 180mg  daily and followup with extender in 2 weeks     Notified patient of above information and recommendation from Dr. Radford Pax. Patient verbalized understanding and agreement. Rx called in to pharmacy. Patient expresses no questions or concerns.

## 2015-04-14 NOTE — Telephone Encounter (Signed)
New message  Pt returning Jason Stokes's phone call from yesterday 9/26. Please call back and discuss.

## 2015-04-15 NOTE — Telephone Encounter (Signed)
See 9/27 phone note. Confirmed with patient he has started increased dose of diltiazem. FU scheduled with [name] Monday, Oct 17 at 0830. Patient agrees with treatment plan.

## 2015-05-01 NOTE — Progress Notes (Signed)
Cardiology Office Note   Date:  05/04/2015   ID:  Jason Stokes, DOB [birth year], MRN [id number]  PCP:  [name], MD  Cardiologist:  Dr. [name]   Electrophysiologist:  n/a  Chief Complaint  Patient presents with  . Follow-up  . Atrial Fibrillation    Diltiazem dose increased     History of Present Illness: Jason Stokes is a 54 y.o. male with a hx of PAF, HTN, remote CVA.  He presented to the emergency room in 4/16 with AF with RVR. He was started on Eliquis and diltiazem. He was instructed to follow-up with cardiology. However, this never occurred and he was evaluated by Dr. Radford Pax 03/27/15. He c/o palpitations. He was in NSR on that date. Event monitor demonstrated recurrent episodes of PAF with heart rates in the 130s. Diltiazem dose was increased. He returns for follow-up.  He has had much less palpitations since increasing the dose of Diltiazem.  However, he had another prolonged episode yesterday.  He thinks it lasted 3-4 hours. He denies chest pain.  He does get SOB with minimal activity when he experiences AFib. He denies syncope. He denies orthopnea, PND.  He has mild pedal edema.  He does note excessive sweating.  He does snore and admits to daytime hypersomnolence.  Denies bleeding issues.     Studies/Reports Reviewed Today:  Echo 03/31/15 Mild LVH, mild focal basal septal hypertrophy, EF 55-60%, no RWMA, Gr 1 DD, mild LAE   Past Medical History  Diagnosis Date  . Chronic back pain   . Hypertension   . Depression   . Small vessel disease ([place])     Right basal ganglia stroke  . History of alcohol abuse   . Sciatica neuralgia   . PAF (paroxysmal atrial fibrillation) ([place]) 03/27/2015    Past Surgical History  Procedure Laterality Date  . Intercostal nerve block  2005  . Surgery back of head  1996-97     Current Outpatient Prescriptions  Medication Sig Dispense Refill  . ABILIFY 15 MG tablet Take 15 mg by mouth daily.  1  .  acetaminophen-codeine (TYLENOL #3) 300-30 MG per tablet Take 1-2 tablets by mouth every 8 (eight) hours as needed for moderate pain. 60 tablet 0  . apixaban (ELIQUIS) 5 MG TABS tablet Take 1 tablet (5 mg total) by mouth 2 (two) times daily. 60 tablet 0  . atenolol (TENORMIN) 50 MG tablet TAKE 1 TABLET (50 MG TOTAL) BY MOUTH DAILY. 30 tablet 3  . carbamazepine (TEGRETOL) 200 MG tablet Take 200 mg by mouth 2 (two) times daily.    . CYMBALTA 60 MG capsule Take 60 mg by mouth daily.  1  . diltiazem (CARDIZEM CD) 180 MG 24 hr capsule Take 1 capsule (180 mg total) by mouth daily. 90 capsule 3  . fluticasone (FLONASE) 50 MCG/ACT nasal spray Place 2 sprays into both nostrils daily. 16 g 0  . HYDROcodone-acetaminophen (NORCO) 5-325 MG per tablet Take 1 tablet by mouth every 6 (six) hours as needed for severe pain. 10 tablet 0  . levETIRAcetam (KEPPRA) 250 MG tablet 1 po bid x7 days, then increase to 2 po bid    . omeprazole (PRILOSEC) 20 MG capsule Take 1 capsule (20 mg total) by mouth daily. 30 capsule 2  . traZODone (DESYREL) 100 MG tablet Take 100 mg by mouth at bedtime.  1   No current facility-administered medications for this visit.    Allergies:   Lisinopril  Social History:  The patient  reports that he has been smoking Cigarettes.  He has been smoking about 0.30 packs per day. He has never used smokeless tobacco. He reports that he uses illicit drugs (Marijuana and Cocaine). He reports that he does not drink alcohol.   Family History:  The patient's family history includes Heart disease in his father; Schizophrenia in his sister.    ROS:   Please see the history of present illness.   Review of Systems  Cardiovascular: Positive for irregular heartbeat.  Respiratory: Positive for shortness of breath.   Musculoskeletal: Positive for back pain, joint pain and myalgias.  Neurological: Positive for loss of balance.  All other systems reviewed and are negative.     PHYSICAL EXAM: VS:   BP 144/78 mmHg  Pulse 58  Ht 6\' 1"  (1.854 m)  Wt 274 lb 1.9 oz (124.34 kg)  BMI 36.17 kg/m2    Wt Readings from Last 3 Encounters:  05/04/15 274 lb 1.9 oz (124.34 kg)  03/27/15 276 lb 6.4 oz (125.374 kg)  03/12/15 273 lb (123.832 kg)     GEN: Well nourished, well developed, in no acute distress HEENT: normal Neck: no JVD,   no masses Cardiac:  Normal S1/S2, RRR; no murmur ,  no rubs or gallops, no edema   Respiratory:  clear to auscultation bilaterally, no wheezing, rhonchi or rales. GI: soft, nontender, nondistended, + BS MS: no deformity or atrophy Skin: warm and dry  Neuro:  CNs II-XII intact, Strength and sensation are intact Psych: Normal affect   EKG:  EKG is ordered today.  It demonstrates:   Sinus bradycardia, HR 58, QTC 394 ms, no change from prior tracings   Recent Labs: 03/27/2015: BUN 12; Creatinine, Ser 1.20; Hemoglobin 14.4; Platelets 420.0*; Potassium 3.6; Sodium 137    Lipid Panel    Component Value Date/Time   CHOL 174 10/17/2013 1042   TRIG 180* 10/17/2013 1042   HDL 32* 10/17/2013 1042   CHOLHDL 5.4 10/17/2013 1042   VLDL 36 10/17/2013 1042   LDLCALC 106* 10/17/2013 1042      ASSESSMENT AND PLAN:  1. PAF:  He has had less palpitations since increasing his calcium channel blocker, but continues to have episodes. He has never been assessed for ischemic heart disease.  CHADS2-VASc=3.  He needs to remain on long term anticoagulation.  He does snore and may have OSA. Discussed case with Dr. [name] today.  -  Arrange Lexiscan Myoview to r/o ischemic heart disease  -  Consider Flecainide 50 mg bid if Myoview neg for ischemia  -  Check TSH  -  Arrange split night sleep study to r/o OSA  -  Continue Eliquis.   2. HTN:  BP elevated today.  He checks his BP when he is out and it is usually optimal.  It goes up when he is in pain from his back and knee.  3. Hx of CVA:  He will need to remain on long term anticoagulation.  4. Back Pain:  If Flecainide  is started, he will only be able to perform a low level ETT to r/o pro-arrhythmia.       Medication Changes: Current medicines are reviewed at length with the patient today.  Concerns regarding medicines are as outlined above.  The following changes have been made:   Discontinued Medications   GABAPENTIN (NEURONTIN) 600 MG TABLET    Take 1 tablet (600 mg total) by mouth 3 (three) times daily.  Modified Medications   No medications on file   New Prescriptions   No medications on file   Labs/ tests ordered today include:   Orders Placed This Encounter  Procedures  . TSH  . Myocardial Perfusion Imaging  . EKG 12-Lead  . Split night study      Disposition:    FU with Dr. [name] or me 2-3 weeks.     Signed, Versie Starks, MHS 05/04/2015 9:25 AM    [facility] [place], [place], [place]  [address] Phone: [phone]; Fax: [phone]

## 2015-05-04 ENCOUNTER — Encounter: Payer: Self-pay | Admitting: Physician Assistant

## 2015-05-04 ENCOUNTER — Ambulatory Visit: Payer: Medicaid Other | Admitting: Cardiology

## 2015-05-04 ENCOUNTER — Ambulatory Visit (INDEPENDENT_AMBULATORY_CARE_PROVIDER_SITE_OTHER): Payer: Medicaid Other | Admitting: Physician Assistant

## 2015-05-04 VITALS — BP 144/78 | HR 58 | Ht 73.0 in | Wt 274.1 lb

## 2015-05-04 DIAGNOSIS — I480 Paroxysmal atrial fibrillation: Secondary | ICD-10-CM | POA: Diagnosis not present

## 2015-05-04 DIAGNOSIS — R0602 Shortness of breath: Secondary | ICD-10-CM | POA: Diagnosis not present

## 2015-05-04 DIAGNOSIS — Z8673 Personal history of transient ischemic attack (TIA), and cerebral infarction without residual deficits: Secondary | ICD-10-CM | POA: Diagnosis not present

## 2015-05-04 DIAGNOSIS — R0683 Snoring: Secondary | ICD-10-CM

## 2015-05-04 DIAGNOSIS — I10 Essential (primary) hypertension: Secondary | ICD-10-CM | POA: Diagnosis not present

## 2015-05-04 LAB — TSH: TSH: 1.823 u[IU]/mL (ref 0.350–4.500)

## 2015-05-04 NOTE — Patient Instructions (Signed)
Medication Instructions:  Your physician recommends that you continue on your current medications as directed. Please refer to the Current Medication list given to you today.   Labwork: TODAY TSH  Testing/Procedures: Your physician has requested that you have a lexiscan myoview. For further information please visit [email or web address]. Please follow instruction sheet, as given.  Your physician has recommended that you have a SPLIT NIGHT sleep study. This test records several body functions during sleep, including: brain activity, eye movement, oxygen and carbon dioxide blood levels, heart rate and rhythm, breathing rate and rhythm, the flow of air through your mouth and nose, snoring, body muscle movements, and chest and belly movement.    Follow-Up: 2-3 WEEKS WITH [name] WEAVER, PAC SAME DAY DR. Radford Pax IS IN THE OFFICE  Any Other Special Instructions Will Be Listed Below (If Applicable).

## 2015-05-05 ENCOUNTER — Telehealth: Payer: Self-pay | Admitting: *Deleted

## 2015-05-05 NOTE — Telephone Encounter (Signed)
Pt notified of lab results by phone with verbal understanding.  

## 2015-05-13 ENCOUNTER — Telehealth (HOSPITAL_COMMUNITY): Payer: Self-pay | Admitting: *Deleted

## 2015-05-13 NOTE — Telephone Encounter (Signed)
Patient given detailed instructions per Myocardial Perfusion Study Information Sheet for the test on 05/15/15 at 815 AM. Patient notified to arrive 15 minutes early and that it is imperative to arrive on time for appointment to keep from having the test rescheduled.  If you need to cancel or reschedule your appointment, please call the office within 24 hours of your appointment. Failure to do so may result in a cancellation of your appointment, and a $50 no show fee. Patient verbalized understanding.Hubbard Robinson, RN

## 2015-05-15 ENCOUNTER — Telehealth: Payer: Self-pay | Admitting: *Deleted

## 2015-05-15 ENCOUNTER — Encounter: Payer: Self-pay | Admitting: Physician Assistant

## 2015-05-15 ENCOUNTER — Ambulatory Visit (HOSPITAL_COMMUNITY): Payer: Medicaid Other | Attending: Cardiology

## 2015-05-15 DIAGNOSIS — R0602 Shortness of breath: Secondary | ICD-10-CM

## 2015-05-15 DIAGNOSIS — I4891 Unspecified atrial fibrillation: Secondary | ICD-10-CM | POA: Insufficient documentation

## 2015-05-15 DIAGNOSIS — R9439 Abnormal result of other cardiovascular function study: Secondary | ICD-10-CM | POA: Diagnosis not present

## 2015-05-15 LAB — MYOCARDIAL PERFUSION IMAGING
Estimated workload: 1 METS
LV dias vol: 147 mL
LV sys vol: 74 mL
Peak HR: 90 {beats}/min
Percent of predicted max HR: 54 %
RATE: 0.3
Rest HR: 53 {beats}/min
SDS: 2
SRS: 2
SSS: 4
Stage 1 DBP: 97 mmHg
Stage 1 Grade: 0 %
Stage 1 HR: 52 {beats}/min
Stage 1 SBP: 129 mmHg
Stage 1 Speed: 0 mph
Stage 2 Grade: 0 %
Stage 2 HR: 51 {beats}/min
Stage 2 Speed: 0 mph
Stage 3 DBP: 103 mmHg
Stage 3 Grade: 0 %
Stage 3 HR: 80 {beats}/min
Stage 3 SBP: 132 mmHg
Stage 3 Speed: 0 mph
Stage 4 Grade: 0 %
Stage 4 HR: 90 {beats}/min
Stage 4 Speed: 0 mph
Stage 5 DBP: 87 mmHg
Stage 5 Grade: 0 %
Stage 5 HR: 81 {beats}/min
Stage 5 SBP: 136 mmHg
Stage 5 Speed: 0 mph
Stage 6 DBP: 89 mmHg
Stage 6 Grade: 0 %
Stage 6 HR: 64 {beats}/min
Stage 6 SBP: 149 mmHg
Stage 6 Speed: 0 mph
TID: 1.05

## 2015-05-15 MED ORDER — TECHNETIUM TC 99M SESTAMIBI GENERIC - CARDIOLITE
10.6000 | Freq: Once | INTRAVENOUS | Status: AC | PRN
  Administered 2015-05-15: 11 via INTRAVENOUS

## 2015-05-15 MED ORDER — FLECAINIDE ACETATE 50 MG PO TABS: 50.0000 mg | ORAL_TABLET | Freq: Two times a day (BID) | ORAL | Status: DC

## 2015-05-15 MED ORDER — TECHNETIUM TC 99M SESTAMIBI GENERIC - CARDIOLITE
31.3000 | Freq: Once | INTRAVENOUS | Status: AC | PRN
  Administered 2015-05-15: 31.3 via INTRAVENOUS

## 2015-05-15 MED ORDER — REGADENOSON 0.4 MG/5ML IV SOLN
0.4000 mg | Freq: Once | INTRAVENOUS | Status: AC
  Administered 2015-05-15: 0.4 mg via INTRAVENOUS

## 2015-05-15 NOTE — Telephone Encounter (Signed)
Pt notified of myoview results by phone and is agreeable to starting Flecainide 50 mg BID, GXT 1-2 weeks after starting Flecainide. I will have Bailey call and schedule GXT. Pt said thank you for our help.

## 2015-05-15 NOTE — Telephone Encounter (Signed)
Lmptcb to go over myoview results and recommendations. 

## 2015-05-18 ENCOUNTER — Other Ambulatory Visit: Payer: Self-pay | Admitting: Neurology

## 2015-05-20 ENCOUNTER — Encounter: Payer: Self-pay | Admitting: Family Medicine

## 2015-05-20 ENCOUNTER — Ambulatory Visit: Payer: Medicaid Other | Attending: Family Medicine | Admitting: Family Medicine

## 2015-05-20 VITALS — BP 145/79 | HR 69 | Temp 98.3°F | Resp 18 | Ht 73.0 in | Wt 277.0 lb

## 2015-05-20 DIAGNOSIS — Z0000 Encounter for general adult medical examination without abnormal findings: Secondary | ICD-10-CM | POA: Diagnosis not present

## 2015-05-20 DIAGNOSIS — R05 Cough: Secondary | ICD-10-CM | POA: Insufficient documentation

## 2015-05-20 DIAGNOSIS — J189 Pneumonia, unspecified organism: Secondary | ICD-10-CM

## 2015-05-20 DIAGNOSIS — M545 Low back pain: Secondary | ICD-10-CM | POA: Diagnosis not present

## 2015-05-20 DIAGNOSIS — G8929 Other chronic pain: Secondary | ICD-10-CM | POA: Insufficient documentation

## 2015-05-20 DIAGNOSIS — M25561 Pain in right knee: Secondary | ICD-10-CM | POA: Diagnosis not present

## 2015-05-20 MED ORDER — ACETAMINOPHEN-CODEINE #3 300-30 MG PO TABS: 1.0000 | ORAL_TABLET | Freq: Three times a day (TID) | ORAL | Status: DC | PRN

## 2015-05-20 MED ORDER — ACE KNEE BRACE LARGE/X-LARGE MISC
1.0000 | Freq: Once | Status: DC
Start: 2015-05-20 — End: 2015-07-10

## 2015-05-20 MED ORDER — AMOXICILLIN-POT CLAVULANATE 875-125 MG PO TABS: 1.0000 | ORAL_TABLET | Freq: Two times a day (BID) | ORAL | Status: DC

## 2015-05-20 MED ORDER — FLUTICASONE PROPIONATE 50 MCG/ACT NA SUSP: 2.0000 | Freq: Every day | NASAL | Status: DC

## 2015-05-20 NOTE — Progress Notes (Signed)
   Subjective:    Patient ID: Jason Stokes, male    DOB: [birth year], 54 y.o.   MRN: [id number]  HPI Patient here for complaint of cough for the past 1 month; worse at night but also in the day. Coughing paroxysms so strong he has post tussive emesis.  No fevers or dyspnea, cough is mostly dry.  Nasal drainage, history of recurrent sinusitis as recently as July. Using Flonase, otc cough syrups, and antihistamines which are not helping at all.   Patient smokes 1 cigarette per day.  Recent cardiology workup for PAF with Myoview, has another one scheduled in 2 days.  No chest pain, does have some fluttering. Is on flecainide.   Chronic low back pain, [facility] pain clinic [facility] for this and is being evaluated for nerve stimulator. Requests T#3 for the pain; says he does not have a pain contract and that he is not taking narcotics at this time for the pain.   Back pain has altered his gait, recently began to have R knee pain which is helped by a knee brace that is not in good shape.  No acute injury that he reports.     Review of Systems     Objective:   Physical Exam Well appearing, no distress HEENT Neck supple, no cervical adenopathy. TMs clear. Nasal mucosa with injection, lots of discharge not purulent. No frontal or maxillary tenderness. Conjunctivae injected.  Moist mucus membranes. Clear oropharynx.  COR regular S1S2 PULM Clear, good air movement, no wheezes or rales.  Able to bear weight on knee without assistance.        Assessment & Plan:

## 2015-05-20 NOTE — Assessment & Plan Note (Signed)
Patient with chronic back pain; recently had worse pain in his knee, would like another knee brace. Rx given.

## 2015-05-20 NOTE — Patient Instructions (Signed)
It was a pleasure to see you today.   For the cough, I am concerned about a possible nasal cause of the cough (such as a worsening of your sinusitis, causing a bronchitis).  Given the length of time you have been coughing, I am starting you on Augmentin, take 1 tablet by mouth two times daily for 10 days.   Continue to use the nasal saline and then the Flonase nasal spray as well.   Call or return if you experience fevers/chills, worsening of the cough, or shortness of breath.   I am writing a prescription for a knee brace.   Also refill for your Tylenol #3 tablets for your chronic back pain.

## 2015-05-20 NOTE — Assessment & Plan Note (Signed)
Continues with pain clinic evluation for nerve stimulator; T#3 prescription given in the interim.

## 2015-05-20 NOTE — Progress Notes (Signed)
Patient here for cough.  Cough has been present for over a month. OTC medications have given no relief. Patient states with weather change and night air patients cough becomes worse.  Patient has taken medications this morning.  Patient requesting knee brace and refill on Tylenol 3.

## 2015-05-22 ENCOUNTER — Ambulatory Visit (HOSPITAL_COMMUNITY)
Admission: RE | Admit: 2015-05-22 | Discharge: 2015-05-22 | Disposition: A | Discharge status: Home / Self Care | Payer: Medicaid Other | Source: Ambulatory Visit | Attending: Physician Assistant | Admitting: Physician Assistant

## 2015-05-22 ENCOUNTER — Telehealth: Payer: Self-pay | Admitting: Physician Assistant

## 2015-05-22 DIAGNOSIS — I4891 Unspecified atrial fibrillation: Secondary | ICD-10-CM | POA: Diagnosis present

## 2015-05-22 NOTE — Telephone Encounter (Signed)
Jason Stokes who recently started flecainide for recurrent PAF presented for POET. He had negative lexiscan myoview last week. He is here for POET to r/o proarrythmia. He has baseline back pain. He also underwent R knee arthroscopy last month and is still recovery. He does not walk very fast and cannot walk incline at this point.   I discussed with [name] and offered two options for the patient. Option one, modified [place] protocol at slower speed. Option two, wait 3-4 weeks to allow his R knee to recover and reschedule POET. Patient really want to delay POET and does not think he can go through the treadmill today. I will cancel his test today and forward this note to [name] to arrange POET at a later time.   Hilbert Corrigan PA Pager: [phone]

## 2015-05-25 NOTE — Telephone Encounter (Signed)
Please call patient and arrange POET in our office in 3-4 weeks. [name], PA-C   05/25/2015 5:16 PM

## 2015-05-26 ENCOUNTER — Telehealth: Payer: Self-pay | Admitting: *Deleted

## 2015-05-26 ENCOUNTER — Telehealth: Payer: Self-pay | Admitting: Family Medicine

## 2015-05-26 ENCOUNTER — Other Ambulatory Visit: Payer: Self-pay | Admitting: Family Medicine

## 2015-05-26 DIAGNOSIS — Z0289 Encounter for other administrative examinations: Secondary | ICD-10-CM

## 2015-05-26 NOTE — Telephone Encounter (Signed)
Patient called stating that he received a prescription for a knee brace but medicaid won't pay for it due to the wording. Patient stated that he would bring the prescription to have it changed. Please f/u with patient for further information.

## 2015-05-26 NOTE — Telephone Encounter (Signed)
I called pt to schedule him for GXT due to new Flecainide. Pt informed me that he d/c'd Flecainide 11/4 on his own, said made his "bottom of his heart beat fast and break him out in a sweat". Pt has appt 11/9 w/PA advised to appt w/PA to discuss. pt ok

## 2015-05-26 NOTE — Progress Notes (Signed)
Cardiology Office Note   Date:  05/27/2015   ID:  Jason Stokes, DOB 1961/07/12, MRN [id number]  Patient Care Team: Boykin Nearing, MD as PCP - General (Family Medicine) Ashok Pall, MD as Consulting Physician (Neurosurgery) Sueanne Margarita, MD as Consulting Physician (Cardiology)    Chief Complaint  Patient presents with  . Follow-up  . Atrial Fibrillation     History of Present Illness: Jason Stokes is a 54 y.o. male with a hx of PAF, HTN, remote CVA.  He presented to the emergency room in 4/16 with AF with RVR. He was started on Eliquis and diltiazem. He was instructed to follow-up with cardiology. However, this never occurred and he was evaluated by Dr. Radford Pax 03/27/15. He c/o palpitations. He was in NSR on that date. Event monitor demonstrated recurrent episodes of PAF with heart rates in the 130s.  Last seen in FU 05/04/15.  He was having less palpitations but they were still occuring.  I reviewed his case with Dr. [name] and we arranged nuclear stress testing.    This was low risk and neg for ischemia.  We therefore placed him on Flecainide for rhythm control.  He was referred for ETT to rule out pro-arrhythmia after starting Flecainide.  However, he could not do the ETT last week due to residual knee pain from a recent surgery. FU ETT has been postponed for several weeks.    Returns for FU.  The patient stopped his flecainide due to side effects. He was having diaphoresis and rapid palpitations and lightheadedness. He denies syncope. Denies chest pain, dyspnea, orthopnea, PND or edema. Since stopping his flecainide, he has had no further symptoms. He denies any further symptoms reminiscent of his atrial fibrillation.   Studies/Reports Reviewed Today:  Myoview 05/15/15 EF 50%, diaph atten, no ischemia, Low Risk  Echo 03/31/15 Mild LVH, mild focal basal septal hypertrophy, EF 55-60%, no RWMA, Gr 1 DD, mild LAE   Past Medical History  Diagnosis Date  . Chronic back  pain   . Hypertension   . Depression   . Small vessel disease ([place])     Right basal ganglia stroke  . History of alcohol abuse   . Sciatica neuralgia   . PAF (paroxysmal atrial fibrillation) ([place]) 03/27/2015    a. Myoview neg for ischemia >> Flecainide started 10/16 >> FU ETT   . History of nuclear stress test     Myoview 10/16: EF 50%, diaphragmatic attenuation, no ischemia, low risk    Past Surgical History  Procedure Laterality Date  . Intercostal nerve block  2005  . Surgery back of head  1996-97     Current Outpatient Prescriptions  Medication Sig Dispense Refill  . ABILIFY 15 MG tablet Take 15 mg by mouth daily.  1  . acetaminophen-codeine (TYLENOL #3) 300-30 MG tablet Take 1-2 tablets by mouth every 8 (eight) hours as needed for moderate pain. 60 tablet 0  . amoxicillin-clavulanate (AUGMENTIN) 875-125 MG tablet Take 1 tablet by mouth 2 (two) times daily. 20 tablet 0  . apixaban (ELIQUIS) 5 MG TABS tablet Take 1 tablet (5 mg total) by mouth 2 (two) times daily. 60 tablet 0  . atenolol (TENORMIN) 50 MG tablet TAKE 1 TABLET (50 MG TOTAL) BY MOUTH DAILY. 30 tablet 3  . carbamazepine (TEGRETOL) 200 MG tablet Take 200 mg by mouth 2 (two) times daily.    . CYMBALTA 60 MG capsule Take 60 mg by mouth daily.  1  . diltiazem (  CARDIZEM CD) 180 MG 24 hr capsule Take 1 capsule (180 mg total) by mouth daily. 90 capsule 3  . Elastic Bandages & Supports (ACE KNEE BRACE LARGE/X-LARGE) MISC 1 each by Does not apply route once. 1 each 0  . fluticasone (FLONASE) 50 MCG/ACT nasal spray Place 2 sprays into both nostrils daily. 16 g 0  . HYDROcodone-acetaminophen (NORCO) 5-325 MG per tablet Take 1 tablet by mouth every 6 (six) hours as needed for severe pain. 10 tablet 0  . omeprazole (PRILOSEC) 20 MG capsule Take 1 capsule (20 mg total) by mouth daily. 30 capsule 2  . hydrALAZINE (APRESOLINE) 25 MG tablet Take 1 tablet (25 mg total) by mouth 3 (three) times daily. 90 tablet 11  . levETIRAcetam  (KEPPRA) 250 MG tablet 1 po bid x7 days, then increase to 2 po bid     No current facility-administered medications for this visit.    Allergies:   Lisinopril    Social History:  The patient  reports that he has been smoking Cigarettes.  He has been smoking about 1.00 pack per day. He has never used smokeless tobacco. He reports that he uses illicit drugs (Marijuana). He reports that he does not drink alcohol.   Family History:  The patient's family history includes Heart disease in his father; Schizophrenia in his sister.    ROS:   Please see the history of present illness.   Review of Systems  Constitution: Positive for weight gain.  Eyes: Positive for visual disturbance.  Respiratory: Positive for shortness of breath.   Musculoskeletal: Positive for back pain, joint pain and myalgias.  All other systems reviewed and are negative.     PHYSICAL EXAM: VS:  BP 160/100 mmHg  Pulse 55  Ht 6\' 1"  (1.854 m)  Wt 275 lb 12.8 oz (125.102 kg)  BMI 36.40 kg/m2    Wt Readings from Last 3 Encounters:  05/27/15 275 lb 12.8 oz (125.102 kg)  05/20/15 277 lb (125.646 kg)  05/04/15 274 lb 1.9 oz (124.34 kg)     GEN: Well nourished, well developed, in no acute distress HEENT: normal Neck: no JVD,   no masses Cardiac:  Normal S1/S2, RRR; no murmur ,  no rubs or gallops, no edema   Respiratory:  clear to auscultation bilaterally, no wheezing, rhonchi or rales. GI: soft, nontender, nondistended, + BS MS: no deformity or atrophy Skin: warm and dry  Neuro:  CNs II-XII intact, Strength and sensation are intact Psych: Normal affect   EKG:  EKG is not ordered today.  It demonstrates:   n/a   Recent Labs: 03/27/2015: BUN 12; Creatinine, Ser 1.20; Hemoglobin 14.4; Platelets 420.0*; Potassium 3.6; Sodium 137 05/04/2015: TSH 1.823    Lipid Panel    Component Value Date/Time   CHOL 174 10/17/2013 1042   TRIG 180* 10/17/2013 1042   HDL 32* 10/17/2013 1042   CHOLHDL 5.4 10/17/2013 1042    VLDL 36 10/17/2013 1042   LDLCALC 106* 10/17/2013 1042      ASSESSMENT AND PLAN:  1. PAF:   Maintaining NSR.  CHADS2-VASc=3.  He needs to remain on long term anticoagulation.   Continue Eliquis, beta blocker, calcium channel blocker.  He stopped flecainide on his own secondary to side effects. I reviewed this further with Dr. Radford Pax. He is fairly symptomatic when he does have atrial fibrillation. He still likely needs rhythm control. We will refer him to the atrial fibrillation clinic for further management.  2. HTN:  Uncontrolled. He attributes most  of this to pain from his back and knee. I will add hydralazine 25 mg 3 times a day. Continue atenolol and diltiazem.  3. Hx of CVA:  He will need to remain on long term anticoagulation.  4. Snoring:  Sleep study pending.       Medication Changes: Current medicines are reviewed at length with the patient today.  Concerns regarding medicines are as outlined above.  The following changes have been made:   Discontinued Medications   CARBAMAZEPINE (TEGRETOL) 200 MG TABLET    TAKE 1/2 TABLET TWICE A DAY FOR 2 WEEKS, THEN TAKE ONE TABLET TWICE A DAY   FLECAINIDE (TAMBOCOR) 50 MG TABLET    Take 1 tablet (50 mg total) by mouth 2 (two) times daily.   TRAZODONE (DESYREL) 100 MG TABLET    Take 100 mg by mouth at bedtime.   Modified Medications   No medications on file   New Prescriptions   HYDRALAZINE (APRESOLINE) 25 MG TABLET    Take 1 tablet (25 mg total) by mouth 3 (three) times daily.   Labs/ tests ordered today include:   No orders of the defined types were placed in this encounter.     Disposition:    FU Dr. Radford Pax 3 months. Referred atrial fibrillation clinic for rhythm control.   Signed, Versie Starks, MHS 05/27/2015 9:01 AM    [facility] [address], [place], [place]  [address] Phone: [phone]; Fax: [phone]

## 2015-05-27 ENCOUNTER — Ambulatory Visit (INDEPENDENT_AMBULATORY_CARE_PROVIDER_SITE_OTHER): Payer: Medicaid Other | Admitting: Physician Assistant

## 2015-05-27 ENCOUNTER — Encounter: Payer: Self-pay | Admitting: Physician Assistant

## 2015-05-27 VITALS — BP 160/100 | HR 55 | Ht 73.0 in | Wt 275.8 lb

## 2015-05-27 DIAGNOSIS — I480 Paroxysmal atrial fibrillation: Secondary | ICD-10-CM | POA: Diagnosis not present

## 2015-05-27 DIAGNOSIS — R0683 Snoring: Secondary | ICD-10-CM

## 2015-05-27 DIAGNOSIS — I10 Essential (primary) hypertension: Secondary | ICD-10-CM

## 2015-05-27 DIAGNOSIS — Z8673 Personal history of transient ischemic attack (TIA), and cerebral infarction without residual deficits: Secondary | ICD-10-CM

## 2015-05-27 MED ORDER — HYDRALAZINE HCL 25 MG PO TABS: 25.0000 mg | ORAL_TABLET | Freq: Three times a day (TID) | ORAL | Status: DC

## 2015-05-27 NOTE — Telephone Encounter (Signed)
See OV note from today. [name], PA-C   05/27/2015 5:37 PM

## 2015-05-27 NOTE — Patient Instructions (Signed)
Medication Instructions:  1. STOP FLECAINIDE  2. START HYDRALAZINE 25 MG 1 TABLET 3 TIMES A DAY  Labwork: NONE  Testing/Procedures: NONE  Follow-Up: 1. DR. TURNER IN 3 MONTHS  2. YOU ARE BEING REFERRED TO THE A-FIB CLINIC  Any Other Special Instructions Will Be Listed Below (If Applicable).     If you need a refill on your cardiac medications before your next appointment, please call your pharmacy.

## 2015-06-03 ENCOUNTER — Encounter (HOSPITAL_COMMUNITY): Payer: Self-pay | Admitting: *Deleted

## 2015-06-16 ENCOUNTER — Other Ambulatory Visit: Payer: Self-pay | Admitting: *Deleted

## 2015-06-16 MED ORDER — APIXABAN 5 MG PO TABS: 5.0000 mg | ORAL_TABLET | Freq: Two times a day (BID) | ORAL | Status: DC

## 2015-06-16 NOTE — Telephone Encounter (Signed)
Pt needs eliquis until he gets to afib clinic, fill under [facility]? If was filled in the ED, please advise.

## 2015-06-19 ENCOUNTER — Ambulatory Visit (HOSPITAL_COMMUNITY)
Admission: RE | Admit: 2015-06-19 | Discharge: 2015-06-19 | Disposition: A | Discharge status: Home / Self Care | Payer: Medicaid Other | Source: Ambulatory Visit | Attending: Nurse Practitioner | Admitting: Nurse Practitioner

## 2015-06-19 ENCOUNTER — Other Ambulatory Visit: Payer: Self-pay | Admitting: Internal Medicine

## 2015-06-19 ENCOUNTER — Encounter (HOSPITAL_COMMUNITY): Payer: Self-pay | Admitting: Nurse Practitioner

## 2015-06-19 VITALS — BP 142/86 | HR 80 | Ht 73.0 in | Wt 276.8 lb

## 2015-06-19 DIAGNOSIS — I480 Paroxysmal atrial fibrillation: Secondary | ICD-10-CM | POA: Insufficient documentation

## 2015-06-19 DIAGNOSIS — R0683 Snoring: Secondary | ICD-10-CM | POA: Diagnosis not present

## 2015-06-19 DIAGNOSIS — I10 Essential (primary) hypertension: Secondary | ICD-10-CM | POA: Insufficient documentation

## 2015-06-19 MED ORDER — APIXABAN 5 MG PO TABS: 5.0000 mg | ORAL_TABLET | Freq: Two times a day (BID) | ORAL | Status: DC

## 2015-06-19 NOTE — Progress Notes (Signed)
Patient ID: Jason Stokes, male   DOB: Apr 25, 1961, 54 y.o.   MRN: [id number]     Primary Care Physician: Jason Ends, MD Referring Physician: Rayvon Char Cortrell Stokes is a 54 y.o. male with a h/o PAF, HTN, remote CVA. He presented to the emergency room in 4/16 with AF with RVR. He was started on Eliquis and diltiazem. He was instructed to follow-up with cardiology. However, this never occurred and he was evaluated by Jason Stokes 03/27/15. He c/o palpitations. He was in NSR on that date. Event monitor demonstrated recurrent episodes of PAF with heart rates in the 130s. Last seen in FU 05/04/15. He was having less palpitations but they were still occuring. I reviewed his case with Jason Stokes and we arranged nuclear stress testing. This was low risk and neg for ischemia. We therefore placed Stokes on Flecainide for rhythm control. He was referred for ETT to rule out pro-arrhythmia after starting Flecainide. However, he could not do the ETT last week due to residual knee pain from a recent surgery. FU ETT has been postponed for several weeks.   Returns for FU. The patient stopped his flecainide due to side effects. He was having diaphoresis and rapid palpitations and lightheadedness. He denies syncope. Denies chest pain, dyspnea, orthopnea, PND or edema. Since stopping his flecainide, he has had no further symptoms. He denies any further symptoms reminiscent of his atrial fibrillation.  He was referred to the afib clinic for further management. He has been out of eliquis for over a week. States he called for refills but no phone note is seen in epic. Chadsvasc score is 3(htn, prior stroke). He is in SR today. Feels better off flecainide. Appears to have a low afib burden currently only reporting 20-30 mins of afib once to twice a week. Usually is aware of irregularity but can function without shortness of breath or fatigue. He also reports that he might need a spinal stimulator  inserted into his back in the near future.  Sleep study is pending, denies current alcohol or caffeine use, no smoking.   Today, he denies symptoms of palpitations, chest pain, shortness of breath, orthopnea, PND, lower extremity edema, dizziness, presyncope, syncope, or neurologic sequela. The patient is tolerating medications without difficulties and is otherwise without complaint today.   Past Medical History  Diagnosis Date  . Chronic back pain   . Hypertension   . Depression   . Small vessel disease ([place])     Right basal ganglia stroke  . History of alcohol abuse   . Sciatica neuralgia   . PAF (paroxysmal atrial fibrillation) ([place]) 03/27/2015    a. Myoview neg for ischemia >> Flecainide started 10/16 >> FU ETT   . History of nuclear stress test     Myoview 10/16: EF 50%, diaphragmatic attenuation, no ischemia, low risk   Past Surgical History  Procedure Laterality Date  . Intercostal nerve block  2005  . Surgery back of head  1996-97    Current Outpatient Prescriptions  Medication Sig Dispense Refill  . ABILIFY 15 MG tablet Take 15 mg by mouth daily.  1  . acetaminophen-codeine (TYLENOL #3) 300-30 MG tablet Take 1-2 tablets by mouth every 8 (eight) hours as needed for moderate pain. 60 tablet 0  . amoxicillin-clavulanate (AUGMENTIN) 875-125 MG tablet Take 1 tablet by mouth 2 (two) times daily. 20 tablet 0  . apixaban (ELIQUIS) 5 MG TABS tablet Take 1 tablet (5 mg total) by mouth  2 (two) times daily. 60 tablet 6  . atenolol (TENORMIN) 50 MG tablet TAKE 1 TABLET (50 MG TOTAL) BY MOUTH DAILY. 30 tablet 3  . carbamazepine (TEGRETOL) 200 MG tablet Take 200 mg by mouth 2 (two) times daily.    . CYMBALTA 60 MG capsule Take 60 mg by mouth daily.  1  . diltiazem (CARDIZEM CD) 180 MG 24 hr capsule Take 1 capsule (180 mg total) by mouth daily. 90 capsule 3  . Elastic Bandages & Supports (ACE KNEE BRACE LARGE/X-LARGE) MISC 1 each by Does not apply route once. 1 each 0  . fluticasone  (FLONASE) 50 MCG/ACT nasal spray Place 2 sprays into both nostrils daily. 16 g 0  . hydrALAZINE (APRESOLINE) 25 MG tablet Take 1 tablet (25 mg total) by mouth 3 (three) times daily. 90 tablet 11  . HYDROcodone-acetaminophen (NORCO) 5-325 MG per tablet Take 1 tablet by mouth every 6 (six) hours as needed for severe pain. 10 tablet 0  . levETIRAcetam (KEPPRA) 250 MG tablet 1 po bid x7 days, then increase to 2 po bid    . omeprazole (PRILOSEC) 20 MG capsule Take 1 capsule (20 mg total) by mouth daily. 30 capsule 2   No current facility-administered medications for this encounter.    Allergies  Allergen Reactions  . Lisinopril Anaphylaxis    Swelling of lips and tongue.    Social History   Social History  . Marital Status: Single    Spouse Name: Jason Stokes  . Number of Children: 3  . Years of Education: HS   Occupational History  .      disabled   Social History Main Topics  . Smoking status: Current Some Day Smoker -- 1.00 packs/day    Types: Cigarettes  . Smokeless tobacco: Never Used     Comment: smoking 3 cigs/day  . Alcohol Use: No     Comment: History of alcohol abuse, marijuana use, cocaine use  . Drug Use: Yes    Special: Marijuana     Comment: Stated never used Cocaine   . Sexual Activity: Yes   Other Topics Concern  . Not on file   Social History Narrative   Patient lives at home with [name].    Patient has 3 children 2 step.    Patient has 13 years of schooling.    Patient is right handed.     Family History  Problem Relation Age of Onset  . Heart disease Father   . Schizophrenia Sister     ROS- All systems are reviewed and negative except as per the HPI above  Physical Exam: Filed Vitals:   06/19/15 1032  BP: 142/86  Pulse: 80  Height: 6\' 1"  (1.854 m)  Weight: 276 lb 12.8 oz (125.556 kg)    GEN- The patient is well appearing, alert and oriented x 3 today.   Head- normocephalic, atraumatic Eyes-  Sclera clear, conjunctiva pink Ears- hearing  intact Oropharynx- clear Neck- supple, no JVP Lymph- no cervical lymphadenopathy Lungs- Clear to ausculation bilaterally, normal work of breathing Heart- Regular rate and rhythm, no murmurs, rubs or gallops, PMI not laterally displaced GI- soft, NT, ND, + BS Extremities- no clubbing, cyanosis, or edema MS- no significant deformity or atrophy Skin- no rash or lesion Psych- euthymic mood, full affect Neuro- strength and sensation are intact  EKG-NSR, septal infarct pr int 168 ms, qrs int 62ms, qtc 419 ms.  Echo-- Left ventricle: The cavity size was normal. Wall thickness was increased in a  pattern of mild LVH. There was mild focal basal hypertrophy of the septum. Systolic function was normal. The estimated ejection fraction was in the range of 55% to 60%. Wall motion was normal; there were no regional wall motion abnormalities. Doppler parameters are consistent with abnormal left ventricular relaxation (grade 1 diastolic dysfunction). - Left atrium: The atrium was mildly dilated.  Impressions:  - Normal LV systolic function; grade 1 diastolic dysfunction; mild LAE.  Myoview-  The left ventricular ejection fraction is mildly decreased (45-54%).  Nuclear stress EF: 50%.  There was no ST segment deviation noted during stress.  Defect 1: Moderate sized, mild in intensity, fixed defect in the mid and basal inferior and basal inferolateral wall consistent with diaphragmatic attenuation. No ischemia noted.  This is a low risk study.   Assessment and Plan: 1. PAF Minimally symptomatic currently but has been symptomatic in past with high v rates Tried flecainide, could not tolerate due to making his heart race/sweating. Off eliquis x one week, refill given and stressed not to interrupt drug due to risk of stroke Question non compliance Considered multaq but checked with in house pharmacist and she stated may be interaction between tegretol and cause multaq to have  decreased blood levels and therefore ineffective.  He is on the young side for amiodarone and I wonder if he would be compliant with a drug like tikosyn. Pharmacist also said that tegretol has small amount of qt prolongation effect.  I will refer  to Dr. Curt Bears to discuss other options, ablation or other antiarrythmic. Continue BB/CCB  2. Snoring Sleep study is pending  3. Htn Stable  afib clinic as needed  Geroge Baseman. [place], [facility] [address] [place], [place] [address] [phone]

## 2015-07-10 ENCOUNTER — Ambulatory Visit: Payer: Medicaid Other | Attending: Family Medicine | Admitting: Family Medicine

## 2015-07-10 ENCOUNTER — Encounter: Payer: Self-pay | Admitting: Family Medicine

## 2015-07-10 VITALS — BP 152/94 | HR 71 | Temp 98.1°F | Resp 16 | Ht 73.0 in | Wt 271.0 lb

## 2015-07-10 DIAGNOSIS — H1013 Acute atopic conjunctivitis, bilateral: Secondary | ICD-10-CM | POA: Insufficient documentation

## 2015-07-10 DIAGNOSIS — Z7901 Long term (current) use of anticoagulants: Secondary | ICD-10-CM | POA: Insufficient documentation

## 2015-07-10 DIAGNOSIS — Z79899 Other long term (current) drug therapy: Secondary | ICD-10-CM | POA: Insufficient documentation

## 2015-07-10 DIAGNOSIS — L309 Dermatitis, unspecified: Secondary | ICD-10-CM | POA: Insufficient documentation

## 2015-07-10 DIAGNOSIS — F1290 Cannabis use, unspecified, uncomplicated: Secondary | ICD-10-CM | POA: Insufficient documentation

## 2015-07-10 DIAGNOSIS — IMO0001 Reserved for inherently not codable concepts without codable children: Secondary | ICD-10-CM

## 2015-07-10 DIAGNOSIS — F17210 Nicotine dependence, cigarettes, uncomplicated: Secondary | ICD-10-CM | POA: Insufficient documentation

## 2015-07-10 DIAGNOSIS — R0981 Nasal congestion: Secondary | ICD-10-CM | POA: Diagnosis not present

## 2015-07-10 DIAGNOSIS — I4891 Unspecified atrial fibrillation: Secondary | ICD-10-CM | POA: Diagnosis not present

## 2015-07-10 DIAGNOSIS — R61 Generalized hyperhidrosis: Secondary | ICD-10-CM | POA: Diagnosis not present

## 2015-07-10 DIAGNOSIS — H109 Unspecified conjunctivitis: Secondary | ICD-10-CM | POA: Diagnosis not present

## 2015-07-10 LAB — POCT GLYCOSYLATED HEMOGLOBIN (HGB A1C): Hemoglobin A1C: 5.6

## 2015-07-10 LAB — GLUCOSE, POCT (MANUAL RESULT ENTRY): POC Glucose: 118 mg/dl — AB (ref 70–99)

## 2015-07-10 MED ORDER — POLYMYXIN B-TRIMETHOPRIM 10000-0.1 UNIT/ML-% OP SOLN: 1.0000 [drp] | OPHTHALMIC | Status: DC

## 2015-07-10 MED ORDER — TRIAMCINOLONE ACETONIDE 0.5 % EX OINT: 1.0000 "application " | TOPICAL_OINTMENT | Freq: Two times a day (BID) | CUTANEOUS | Status: DC

## 2015-07-10 NOTE — Assessment & Plan Note (Signed)
Counseled against antibiotics antihistamine eye drops instead

## 2015-07-10 NOTE — Progress Notes (Signed)
C/C Rash on arms  Rash red and sometime itching  Pain scale 8 - low back pain Tobacco user 1-2 cigarette per week No suicidal thought in the past week

## 2015-07-10 NOTE — Assessment & Plan Note (Signed)
Recent TSH normal. Normal A1c. No red flags like CP or SOB  Reassurance

## 2015-07-10 NOTE — Assessment & Plan Note (Signed)
Suspect eczema on arms Topical steroid OTC emollient

## 2015-07-10 NOTE — Patient Instructions (Signed)
Diagnoses and all orders for this visit:  Sweating -     Glucose (CBG) -     HgB A1c  Allergic conjunctivitis, bilateral -     trimethoprim-polymyxin b (POLYTRIM) ophthalmic solution; Place 1 drop into the right eye every 4 (four) hours.  Eczema -     triamcinolone ointment (KENALOG) 0.5 %; Apply 1 application topically 2 (two) times daily.   F/u in 6 weeks for physical   Dr. Adrian Blackwater

## 2015-07-10 NOTE — Progress Notes (Signed)
Patient ID: Jason Stokes, male   DOB: 1960/07/29, 54 y.o.   MRN: [id number]   Subjective:  Patient ID: Jason Stokes, male    DOB: 1960/10/25  Age: 54 y.o. MRN: [id number]  CC: Rash   HPI Jason Stokes presents for   1. Rash: on both arms. Round patches. Dry. No new meds or skin products. No rash on any other part of his body. He uses cocoa butter moisturizer.   2. Nasal congestion and watery eyes: he was treated for sinus infection 2 months ago with augment. No fever or chills. No sinus pressure. No eye redness but he wakes with discharge in eyes. He reports using his antihistamine and flonase. Requesting antibiotics.   3. Sweating: intermittently has sweating. At rest and  With exertion. No CP, SOB or nausea with sweating. He smokes 1-2 cigs per week. He has Afib remains on BB and CCB. He reports fam hx of diabetes in father   Social History  Substance Use Topics  . Smoking status: Current Some Day Smoker -- 1.00 packs/day    Types: Cigarettes  . Smokeless tobacco: Never Used     Comment: smoking 3 cigs/day  . Alcohol Use: No     Comment: History of alcohol abuse, marijuana use, cocaine use   Outpatient Prescriptions Prior to Visit  Medication Sig Dispense Refill  . ABILIFY 15 MG tablet Take 15 mg by mouth daily.  1  . acetaminophen-codeine (TYLENOL #3) 300-30 MG tablet Take 1-2 tablets by mouth every 8 (eight) hours as needed for moderate pain. 60 tablet 0  . apixaban (ELIQUIS) 5 MG TABS tablet Take 1 tablet (5 mg total) by mouth 2 (two) times daily. 60 tablet 6  . atenolol (TENORMIN) 50 MG tablet TAKE 1 TABLET (50 MG TOTAL) BY MOUTH DAILY. 30 tablet 3  . carbamazepine (TEGRETOL) 200 MG tablet Take 200 mg by mouth 2 (two) times daily.    . CYMBALTA 60 MG capsule Take 60 mg by mouth daily.  1  . diltiazem (CARDIZEM CD) 180 MG 24 hr capsule Take 1 capsule (180 mg total) by mouth daily. 90 capsule 3  . fluticasone (FLONASE) 50 MCG/ACT nasal spray Place 2 sprays into both nostrils  daily. 16 g 0  . hydrALAZINE (APRESOLINE) 25 MG tablet Take 1 tablet (25 mg total) by mouth 3 (three) times daily. 90 tablet 11  . levETIRAcetam (KEPPRA) 250 MG tablet 1 po bid x7 days, then increase to 2 po bid    . omeprazole (PRILOSEC) 20 MG capsule Take 1 capsule (20 mg total) by mouth daily. 30 capsule 2  . amoxicillin-clavulanate (AUGMENTIN) 875-125 MG tablet Take 1 tablet by mouth 2 (two) times daily. (Patient not taking: Reported on 07/10/2015) 20 tablet 0  . Elastic Bandages & Supports (ACE KNEE BRACE LARGE/X-LARGE) MISC 1 each by Does not apply route once. (Patient not taking: Reported on 07/10/2015) 1 each 0  . HYDROcodone-acetaminophen (NORCO) 5-325 MG per tablet Take 1 tablet by mouth every 6 (six) hours as needed for severe pain. (Patient not taking: Reported on 07/10/2015) 10 tablet 0   No facility-administered medications prior to visit.    ROS Review of Systems  Constitutional: Positive for diaphoresis. Negative for fever, chills, fatigue and unexpected weight change.  HENT: Positive for congestion.   Eyes: Positive for discharge. Negative for visual disturbance.  Respiratory: Negative for cough and shortness of breath.   Cardiovascular: Negative for chest pain, palpitations and leg swelling.  Gastrointestinal: Negative for  nausea, vomiting, abdominal pain, diarrhea, constipation and blood in stool.  Endocrine: Negative for polydipsia, polyphagia and polyuria.  Musculoskeletal: Negative for myalgias, back pain, arthralgias, gait problem and neck pain.  Skin: Positive for rash.  Allergic/Immunologic: Negative for immunocompromised state.  Hematological: Negative for adenopathy. Does not bruise/bleed easily.  Psychiatric/Behavioral: Negative for suicidal ideas, sleep disturbance and dysphoric mood. The patient is not nervous/anxious.     Objective:  BP 152/94 mmHg  Pulse 71  Temp(Src) 98.1 F (36.7 C) (Oral)  Resp 16  Ht 6\' 1"  (1.854 m)  Wt 271 lb (122.925 kg)  BMI  35.76 kg/m2  SpO2 100%  BP/Weight 07/10/2015 06/19/2015 [id number]  Systolic BP 0000000 [id number] 0000000  Diastolic BP 94 86 [id number]  Wt. (Lbs) 271 276.8 275.8  BMI 35.76 36.53 36.4   Physical Exam  Constitutional: He appears well-developed and well-nourished. No distress.  HENT:  Head: Normocephalic and atraumatic.  Nose: Mucosal edema present.  Eyes: Conjunctivae, EOM and lids are normal. Pupils are equal, round, and reactive to light. Lids are everted and swept, no foreign bodies found.  Neck: Normal range of motion. Neck supple.  Cardiovascular: Normal rate, regular rhythm, normal heart sounds and intact distal pulses.   Pulmonary/Chest: Effort normal and breath sounds normal.  Musculoskeletal: He exhibits no edema.  Neurological: He is alert.  Skin: Skin is warm and dry. No rash noted. No erythema.  Xerotic circular patches on arms   Psychiatric: He has a normal mood and affect.   Lab Results  Component Value Date   HGBA1C 5.60 07/10/2015   CBG 118  Assessment & Plan:   Problem List Items Addressed This Visit    Allergic conjunctivitis    Counseled against antibiotics antihistamine eye drops instead       Relevant Medications   trimethoprim-polymyxin b (POLYTRIM) ophthalmic solution   Eczema    Suspect eczema on arms Topical steroid OTC emollient       Relevant Medications   triamcinolone ointment (KENALOG) 0.5 %   Sweating - Primary    Recent TSH normal. Normal A1c. No red flags like CP or SOB  Reassurance       Relevant Orders   Glucose (CBG) (Completed)   HgB A1c (Completed)      No orders of the defined types were placed in this encounter.    Follow-up: No Follow-up on file.   Boykin Nearing MD

## 2015-07-14 ENCOUNTER — Telehealth: Payer: Self-pay

## 2015-07-14 NOTE — Telephone Encounter (Signed)
-----   Message from Boykin Nearing, MD sent at 07/10/2015 10:19 AM EST ----- Screening A1c and blood sugar check normal Patient does not have diabetes

## 2015-07-14 NOTE — Telephone Encounter (Signed)
Tried to contact patient Went right to voice mail  Message left to return our call

## 2015-07-21 ENCOUNTER — Ambulatory Visit (HOSPITAL_BASED_OUTPATIENT_CLINIC_OR_DEPARTMENT_OTHER): Payer: Medicaid Other | Attending: Physician Assistant

## 2015-07-21 DIAGNOSIS — I480 Paroxysmal atrial fibrillation: Secondary | ICD-10-CM | POA: Diagnosis not present

## 2015-07-21 DIAGNOSIS — Z79899 Other long term (current) drug therapy: Secondary | ICD-10-CM | POA: Diagnosis not present

## 2015-07-21 DIAGNOSIS — R0683 Snoring: Secondary | ICD-10-CM | POA: Insufficient documentation

## 2015-07-22 ENCOUNTER — Encounter: Payer: Self-pay | Admitting: Cardiology

## 2015-07-22 ENCOUNTER — Ambulatory Visit (INDEPENDENT_AMBULATORY_CARE_PROVIDER_SITE_OTHER): Payer: Medicaid Other | Admitting: Cardiology

## 2015-07-22 ENCOUNTER — Encounter: Payer: Self-pay | Admitting: *Deleted

## 2015-07-22 VITALS — BP 122/96 | HR 58 | Ht 73.0 in | Wt 272.2 lb

## 2015-07-22 DIAGNOSIS — Z01812 Encounter for preprocedural laboratory examination: Secondary | ICD-10-CM

## 2015-07-22 DIAGNOSIS — I480 Paroxysmal atrial fibrillation: Secondary | ICD-10-CM

## 2015-07-22 NOTE — Patient Instructions (Addendum)
Medication Instructions:  Your physician recommends that you continue on your current medications as directed. Please refer to the Current Medication list given to you today.  Labwork: Your physician recommends that you return for pre procedure lab work on same day you have your cardiac CT.   Testing/Procedures: Your physician has requested that you have cardiac CT. Cardiac computed tomography (CT) is a painless test that uses an x-ray machine to take clear, detailed pictures of your heart. For further information please visit [email or web address]. Please follow instruction sheet as given.  Your physician has recommended that you have an ablation. Catheter ablation is a medical procedure used to treat some cardiac arrhythmias (irregular heartbeats). During catheter ablation, a long, thin, flexible tube is put into a blood vessel in your groin (upper thigh), or neck. This tube is called an ablation catheter. It is then guided to your heart through the blood vessel. Radio frequency waves destroy small areas of heart tissue where abnormal heartbeats may cause an arrhythmia to start. Please see the instruction sheet given to you today.  Follow-Up: Your physician recommends that you schedule a follow-up appointment in: 4 weeks, after ablation on 09/10/15, with Roderic Palau, NP in the AFib clinic.  Your physician recommends that you schedule a follow-up appointment in: 3 months, after ablation on 09/10/2015, with Dr. Curt Bears.  If you need a refill on your cardiac medications before your next appointment, please call your pharmacy.  Thank you for choosing [facility]!!   Trinidad Curet, RN [phone]    Cardiac Ablation Cardiac ablation is a procedure to disable a small amount of heart tissue in very specific places. The heart has many electrical connections. Sometimes these connections are abnormal and can cause the heart to beat very fast or irregularly. By disabling some of the problem  areas, heart rhythm can be improved or made normal. Ablation is done for people who:   Have Wolff-Parkinson-White syndrome.   Have other fast heart rhythms (tachycardia).   Have taken medicines for an abnormal heart rhythm (arrhythmia) that resulted in:   No success.   Side effects.   May have a high-risk heartbeat that could result in death.  LET [facility] CARE PROVIDER KNOW ABOUT:   Any allergies you have or any previous reactions you have had to X-ray dye, food (such as seafood), medicine, or tape.   All medicines you are taking, including vitamins, herbs, eye drops, creams, and over-the-counter medicines.   Previous problems you or members of your family have had with the use of anesthetics.   Any blood disorders you have.   Previous surgeries or procedures (such as a kidney transplant) you have had.   Medical conditions you have (such as kidney failure).  RISKS AND COMPLICATIONS Generally, cardiac ablation is a safe procedure. However, problems can occur and include:   Increased risk of cancer. Depending on how long it takes to do the ablation, the dose of radiation can be high.  Bruising and bleeding where a thin, flexible tube (catheter) was inserted during the procedure.   Bleeding into the chest, especially into the sac that surrounds the heart (serious).  Need for a permanent pacemaker if the normal electrical system is damaged.   The procedure may not be fully effective, and this may not be recognized for months. Repeat ablation procedures are sometimes required. BEFORE THE PROCEDURE   Follow any instructions from your health care provider regarding eating and drinking before the procedure.   Take your medicines as  directed at regular times with water, unless instructed otherwise by your health care provider. If you are taking diabetes medicine, including insulin, ask how you are to take it and if there are any special instructions you should  follow. It is common to adjust insulin dosing the day of the ablation.  PROCEDURE  An ablation is usually performed in a catheterization laboratory with the guidance of fluoroscopy. Fluoroscopy is a type of X-ray that helps your health care provider see images of your heart during the procedure.   An ablation is a minimally invasive procedure. This means a small cut (incision) is made in either your neck or groin. Your health care provider will decide where to make the incision based on your medical history and physical exam.  An IV tube will be started before the procedure begins. You will be given an anesthetic or medicine to help you relax (sedative).  The skin on your neck or groin will be numbed. A needle will be inserted into a large vein in your neck or groin and catheters will be threaded to your heart.  A special dye that shows up on fluoroscopy pictures may be injected through the catheter. The dye helps your health care provider see the area of the heart that needs treatment.  The catheter has electrodes on the tip. When the area of heart tissue that is causing the arrhythmia is found, the catheter tip will send an electrical current to the area and "scar" the tissue. Three types of energy can be used to ablate the heart tissue:   Heat (radiofrequency energy).   Laser energy.   Extreme cold (cryoablation).   When the area of the heart has been ablated, the catheter will be taken out. Pressure will be held on the insertion site. This will help the insertion site clot and keep it from bleeding. A bandage will be placed on the insertion site.  AFTER THE PROCEDURE   After the procedure, you will be taken to a recovery area where your vital signs (blood pressure, heart rate, and breathing) will be monitored. The insertion site will also be monitored for bleeding.   You will need to lie still for 4-6 hours. This is to ensure you do not bleed from the catheter insertion site.     This information is not intended to replace advice given to you by your health care provider. Make sure you discuss any questions you have with your health care provider.   Document Released: 11/20/2008 Document Revised: 07/25/2014 Document Reviewed: 11/26/2012 Elsevier Interactive Patient Education [employer].

## 2015-07-22 NOTE — Progress Notes (Signed)
Electrophysiology Office Note   Date:  07/22/2015   ID:  BODI Jason Stokes, DOB 1960/11/02, MRN [id number]  PCP:  [name], MD  Cardiologist:  [name] Primary Electrophysiologist: [name] [name], MD    Chief Complaint  Patient presents with  . Atrial Fibrillation     History of Present Illness: Jason Stokes is a 55 y.o. male who presents today for electrophysiology evaluation.   He is a history of paroxysmal atrial fibrillation, hypertension and remote CVA. He presented to the emergency room for/16 with atrial fibrillation and RVR. He was started on Eliquis L diltiazem at that time. He did not follow-up with cardiology until 03/27/15 when he complained of palpitations. At that time he was in sinus rhythm. Event monitor demonstrated recurrent episodes of atrial fibrillation with heart rates in the 130s. He is a stress test done which was low risk for ischemia. He was started on flecainide for rhythm control. He had follow-up in December in the [place] clinic at that time he had stopped his flecainide. He was having symptoms such as diaphoresis palpitations and lightheadedness. When he was seen in the A. fib clinic he was feeling better off of his flecainide. He reports only having atrial fibrillation for 20-30 minutes once to twice per week. He is aware of the irregularity but can function without shortness of breath or fatigue.  Over the holidays, he did have episodes of palpitations that he feels like her atrial fibrillation. He says that he got cold and clammy and also weak and tired. They were self terminating and only lasted 30-40 minutes.  Currently, he denies symptoms of palpitations, chest pain, shortness of breath, orthopnea, PND, lower extremity edema, claudication, dizziness, presyncope, syncope, bleeding, or neurologic sequela. The patient is tolerating medications without difficulties and is otherwise without complaint today.    Past Medical History  Diagnosis  Date  . Chronic back pain   . Hypertension   . Depression   . Small vessel disease ([place])     Right basal ganglia stroke  . History of alcohol abuse   . Sciatica neuralgia   . PAF (paroxysmal atrial fibrillation) (Jason Stokes) 03/27/2015    a. Myoview neg for ischemia >> Flecainide started 10/16 >> FU ETT   . History of nuclear stress test     Myoview 10/16: EF 50%, diaphragmatic attenuation, no ischemia, low risk   Past Surgical History  Procedure Laterality Date  . Intercostal nerve block  2005  . Surgery back of head  1996-97     Current Outpatient Prescriptions  Medication Sig Dispense Refill  . ABILIFY 15 MG tablet Take 15 mg by mouth daily.  1  . acetaminophen-codeine (TYLENOL #3) 300-30 MG tablet Take 1-2 tablets by mouth every 8 (eight) hours as needed for moderate pain. 60 tablet 0  . apixaban (ELIQUIS) 5 MG TABS tablet Take 1 tablet (5 mg total) by mouth 2 (two) times daily. 60 tablet 6  . atenolol (TENORMIN) 50 MG tablet TAKE 1 TABLET (50 MG TOTAL) BY MOUTH DAILY. 30 tablet 3  . carbamazepine (TEGRETOL) 200 MG tablet Take 200 mg by mouth 2 (two) times daily.    . CYMBALTA 60 MG capsule Take 60 mg by mouth daily.  1  . diltiazem (CARDIZEM CD) 180 MG 24 hr capsule Take 1 capsule (180 mg total) by mouth daily. 90 capsule 3  . fluticasone (FLONASE) 50 MCG/ACT nasal spray Place 2 sprays into both nostrils daily. 16 g 0  .  hydrALAZINE (APRESOLINE) 25 MG tablet Take 1 tablet (25 mg total) by mouth 3 (three) times daily. 90 tablet 11  . levETIRAcetam (KEPPRA) 250 MG tablet 1 po bid x7 days, then increase to 2 po bid    . omeprazole (PRILOSEC) 20 MG capsule Take 1 capsule (20 mg total) by mouth daily. 30 capsule 2  . triamcinolone ointment (KENALOG) 0.5 % Apply 1 application topically 2 (two) times daily. 30 g 0  . trimethoprim-polymyxin b (POLYTRIM) ophthalmic solution Place 1 drop into the right eye every 4 (four) hours. 10 mL 0   No current facility-administered medications for this  visit.    Allergies:   Lisinopril   Social History:  The patient  reports that he has been smoking Cigarettes.  He has been smoking about 1.00 pack per day. He has never used smokeless tobacco. He reports that he uses illicit drugs (Marijuana). He reports that he does not drink alcohol.   Family History:  The patient's family history includes Heart disease in his father; Schizophrenia in his sister.    ROS:  Please see the history of present illness.   Otherwise, review of systems is positive for leg pain, back pain.   All other systems are reviewed and negative.    PHYSICAL EXAM: VS:  BP 122/96 mmHg  Pulse 58  Ht 6\' 1"  (1.854 m)  Wt 272 lb 3.2 oz (123.469 kg)  BMI 35.92 kg/m2 , BMI Body mass index is 35.92 kg/(m^2). GEN: Well nourished, well developed, in no acute distress HEENT: normal Neck: no JVD, carotid bruits, or masses Cardiac: RRR; no murmurs, rubs, or gallops,no edema  Respiratory:  clear to auscultation bilaterally, normal work of breathing GI: soft, nontender, nondistended, + BS MS: no deformity or atrophy Skin: warm and dry Neuro:  Strength and sensation are intact Psych: euthymic mood, full affect  EKG:  EKG is ordered today. The ekg ordered today shows sinus rhythm, rate 58  Recent Labs: 03/27/2015: BUN 12; Creatinine, Ser 1.20; Hemoglobin 14.4; Platelets 420.0*; Potassium 3.6; Sodium 137 05/04/2015: TSH 1.823    Lipid Panel     Component Value Date/Time   CHOL 174 10/17/2013 1042   TRIG 180* 10/17/2013 1042   HDL 32* 10/17/2013 1042   CHOLHDL 5.4 10/17/2013 1042   VLDL 36 10/17/2013 1042   LDLCALC 106* 10/17/2013 1042     Wt Readings from Last 3 Encounters:  07/22/15 272 lb 3.2 oz (123.469 kg)  07/22/15 272 lb (123.378 kg)  07/10/15 271 lb (122.925 kg)     Other studies Reviewed: Additional studies/ records that were reviewed today include:  SPECT 05/15/15, TTE 03/31/15 Review of the above records today demonstrates:  SPECT  The left  ventricular ejection fraction is mildly decreased (45-54%).  Nuclear stress EF: 50%.  There was no ST segment deviation noted during stress.  Defect 1: Moderate sized, mild in intensity, fixed defect in the mid and basal inferior and basal inferolateral wall consistent with diaphragmatic attenuation. No ischemia noted.  This is a low risk study.  TTE - Normal LV systolic function; grade 1 diastolic dysfunction; mildLAE.  ASSESSMENT AND PLAN:  1.  Paroxysmal atrial fibrillation: Has a chads 2 vasc score of 3 for hypertension and previous stroke. He is on Eliquis for anticoagulation. He has tried therapy with flecainide which did not work for him due to side effects. He is on the outside for amiodarone and therefore this is not a good drug for him. I have discussed with him  the options of ablation versus medical therapy with dofetilide. He says that he would prefer not to be on medications as he has had side effects in the past. We Danial Hlavac therefore plan on ablation of his atrial fibrillation. I have discussed with him the risks and benefits of the procedure. I have told him that he has approximately a 75% success rate. I told him that the risks of the procedure include bleeding, tamponade, heart block, and stroke. The patient understands these risks and has agreed to the procedure. We Casson Catena use CA RTO as well as ice imaging. He Kitara Hebb need a CT scan of his left atrium prior to the procedure.  This patients CHA2DS2-VASc Score and unadjusted Ischemic Stroke Rate (% per year) is equal to 3.2 % stroke rate/year from a score of 3  Above score calculated as 1 point each if present [CHF, HTN, DM, Vascular=MI/PAD/Aortic Plaque, Age if 65-74, or Male] Above score calculated as 2 points each if present [Age > 75, or Stroke/TIA/TE]     Current medicines are reviewed at length with the patient today.   The patient does not have concerns regarding his medicines.  The following changes were made today:   none  Labs/ tests ordered today include:  Orders Placed This Encounter  Procedures  . CT Heart Morp W/Cta Cor W/Score W/Ca W/Cm &/Or Wo/Cm  . Basic metabolic panel  . CBC w/Diff  . EKG 12-Lead     Disposition:   FU with Helio Lack post ablation  Signed, Tekila Caillouet [name], MD  07/22/2015 10:19 AM     [facility] [address] [place] [address] [phone] (office) [phone] (fax)

## 2015-07-26 ENCOUNTER — Telehealth: Payer: Self-pay | Admitting: Cardiology

## 2015-07-26 NOTE — Telephone Encounter (Signed)
Please let patient know that sleep study showed no significant sleep apnea.    

## 2015-07-26 NOTE — Sleep Study (Signed)
   Patient Name: Jason Stokes, Jason Stokes MRN: [id number] Study Date: 07/21/2015 Gender: Male D.O.B: 07/01/1961 Age (years): 29 Referring Provider: [name] Interpreting Physician: [name] MD, ABSM RPSGT: Joni Reining  Weight (lbs): 272 Height (inches): 73 BMI: 36 Neck Size: 18.00  CLINICAL INFORMATION Sleep Study Type: NPSG Indication for sleep study: Snoring Epworth Sleepiness Score: 17  SLEEP STUDY TECHNIQUE As per the AASM Manual for the Scoring of Sleep and Associated Events v2.3 (April 2016) with a hypopnea requiring 4% desaturations. The channels recorded and monitored were frontal, central and occipital EEG, electrooculogram (EOG), submentalis EMG (chin), nasal and oral airflow, thoracic and abdominal wall motion, anterior tibialis EMG, snore microphone, electrocardiogram, and pulse oximetry.  MEDICATIONS Patient's medications include: Abilify, Apixaban, Atenolol, Tegretol, Cymbalta, Cardizem, Flonase, Hydralazine, Keppra, Omeprazole. Medications self-administered by patient during sleep study : Sleep medicine administered - Trazodone at 10:30:12 PM  SLEEP ARCHITECTURE The study was initiated at 11:02:13 PM and ended at 5:03:19 AM. Sleep onset time was 16.7 minutes and the sleep efficiency was normal at 86.0%. The total sleep time was 310.5 minutes. Stage REM latency was prolonged at 221.0 minutes. The patient spent 4.19% of the night in stage N1 sleep, 73.11% in stage N2 sleep, 0.00% in stage N3 and 22.71% in REM. Alpha intrusion was absent. Supine sleep was 83.90%.  RESPIRATORY PARAMETERS The overall apnea/hypopnea index (AHI) was 4.1 per hour. There were 4 total apneas, including 4 obstructive, 0 central and 0 mixed apneas. There were 17 hypopneas and 3 RERAs. The AHI during Stage REM sleep was 11.9 per hour. AHI while supine was 4.8 per hour. The mean oxygen saturation was 94.49%. The minimum SpO2 during sleep was 86.00%. Loud snoring was noted during this  study.  CARDIAC DATA The 2 lead EKG demonstrated sinus rhythm. The mean heart rate was 56.78 beats per minute. Other EKG findings include: None.  LEG MOVEMENT DATA The total PLMS were 0 with a resulting PLMS index of 0.00. Associated arousal with leg movement index was 0.0 .  IMPRESSIONS - No significant obstructive sleep apnea occurred during this study (AHI = 4.1/h). - No significant central sleep apnea occurred during this study (CAI = 0.0/h). - Mild oxygen desaturation was noted during this study (Min O2 = 86.00%). - The patient snored with Loud snoring volume. - Clinically significant periodic limb movements did not occur during sleep. No significant associated arousals.  DIAGNOSIS - snoring  RECOMMENDATIONS - Avoid alcohol, sedatives and other CNS depressants that may worsen sleep apnea and disrupt normal sleep architecture. - Sleep hygiene should be reviewed to assess factors that may improve sleep quality. - Weight management and regular exercise should be initiated or continued if appropriate.    [place], American Board of Sleep Medicine  ELECTRONICALLY SIGNED ON:  07/26/2015, 5:11 PM [place] PH: [phone]   FX: [phone] [place]

## 2015-07-28 NOTE — Telephone Encounter (Signed)
Patient informed of sleep study results. Stated verbal understanding

## 2015-08-03 ENCOUNTER — Encounter: Payer: Self-pay | Admitting: Cardiology

## 2015-08-11 ENCOUNTER — Encounter: Payer: Self-pay | Admitting: Cardiology

## 2015-08-20 ENCOUNTER — Other Ambulatory Visit: Payer: Self-pay | Admitting: Cardiology

## 2015-08-21 ENCOUNTER — Telehealth: Payer: Self-pay | Admitting: *Deleted

## 2015-08-21 NOTE — Telephone Encounter (Signed)
Called to inform patient of the need to reschedule his ablation procedure. Patient agreeable to moving to 09/22/15. Patient aware that instructions remain the same, date is the only change. Also explained that we will need to reschedule pre procedure labs and cardiac CT. States that he is currently driving and asks that we call next week to arrange. Informed patient office would contact him next week. He is agreeable to plan.

## 2015-08-24 ENCOUNTER — Encounter: Payer: Self-pay | Admitting: Cardiology

## 2015-08-31 ENCOUNTER — Other Ambulatory Visit: Payer: Medicaid Other

## 2015-09-02 ENCOUNTER — Ambulatory Visit: Payer: Medicaid Other | Admitting: Cardiology

## 2015-09-03 ENCOUNTER — Ambulatory Visit (HOSPITAL_COMMUNITY)
Admission: RE | Admit: 2015-09-03 | Discharge: 2015-09-03 | Disposition: A | Discharge status: Home / Self Care | Payer: Medicaid Other | Source: Ambulatory Visit | Attending: Nurse Practitioner | Admitting: Nurse Practitioner

## 2015-09-03 ENCOUNTER — Ambulatory Visit (HOSPITAL_COMMUNITY): Payer: Medicaid Other

## 2015-09-03 ENCOUNTER — Ambulatory Visit (HOSPITAL_COMMUNITY): Payer: Medicaid Other | Admitting: Nurse Practitioner

## 2015-09-03 ENCOUNTER — Encounter (HOSPITAL_COMMUNITY): Payer: Self-pay | Admitting: Nurse Practitioner

## 2015-09-03 VITALS — BP 136/82 | HR 112 | Ht 73.0 in | Wt 273.2 lb

## 2015-09-03 DIAGNOSIS — Z8673 Personal history of transient ischemic attack (TIA), and cerebral infarction without residual deficits: Secondary | ICD-10-CM | POA: Diagnosis not present

## 2015-09-03 DIAGNOSIS — F329 Major depressive disorder, single episode, unspecified: Secondary | ICD-10-CM | POA: Diagnosis not present

## 2015-09-03 DIAGNOSIS — Z79899 Other long term (current) drug therapy: Secondary | ICD-10-CM | POA: Diagnosis not present

## 2015-09-03 DIAGNOSIS — M549 Dorsalgia, unspecified: Secondary | ICD-10-CM | POA: Insufficient documentation

## 2015-09-03 DIAGNOSIS — G8929 Other chronic pain: Secondary | ICD-10-CM | POA: Diagnosis not present

## 2015-09-03 DIAGNOSIS — I739 Peripheral vascular disease, unspecified: Secondary | ICD-10-CM | POA: Insufficient documentation

## 2015-09-03 DIAGNOSIS — Z888 Allergy status to other drugs, medicaments and biological substances status: Secondary | ICD-10-CM | POA: Insufficient documentation

## 2015-09-03 DIAGNOSIS — I10 Essential (primary) hypertension: Secondary | ICD-10-CM | POA: Insufficient documentation

## 2015-09-03 DIAGNOSIS — Z8249 Family history of ischemic heart disease and other diseases of the circulatory system: Secondary | ICD-10-CM | POA: Diagnosis not present

## 2015-09-03 DIAGNOSIS — I480 Paroxysmal atrial fibrillation: Secondary | ICD-10-CM

## 2015-09-03 DIAGNOSIS — Z7902 Long term (current) use of antithrombotics/antiplatelets: Secondary | ICD-10-CM | POA: Insufficient documentation

## 2015-09-03 DIAGNOSIS — I481 Persistent atrial fibrillation: Secondary | ICD-10-CM | POA: Insufficient documentation

## 2015-09-03 DIAGNOSIS — F17210 Nicotine dependence, cigarettes, uncomplicated: Secondary | ICD-10-CM | POA: Diagnosis not present

## 2015-09-03 MED ORDER — ATENOLOL 50 MG PO TABS: 50.0000 mg | ORAL_TABLET | Freq: Two times a day (BID) | ORAL | Status: DC

## 2015-09-03 NOTE — Patient Instructions (Signed)
Increase atenolol to 50 mg twice a day.

## 2015-09-03 NOTE — H&P (Signed)
Patient ID: Jason Stokes, male   DOB: Jul 04, 1961, 55 y.o.   MRN: [id number]     Primary Care Physician: [name], MD Referring Physician: Vonda Antigua   Jason Stokes is a 55 y.o. male with a h/o afib, failed flecainide, in the past, that is pending ablation with Dr. Curt Bears 3/7. He is in the afib clinic for f/u and is in afib with RVR. States his afib has been more persistent lately. Discussed increasing atenolol to 20 mg bid for better rate control. He has done this himself when his afib has felt uncomfortable to him in the past. He is taking blood thinners consistently and understands importance pre and post procedure to prevent stroke. The procedure was again explained to him and questions answered.  Today, he denies symptoms of palpitations, chest pain, shortness of breath, orthopnea, PND, lower extremity edema, dizziness, presyncope, syncope, or neurologic sequela. The patient is tolerating medications without difficulties and is otherwise without complaint today.   Past Medical History  Diagnosis Date  . Chronic back pain   . Hypertension   . Depression   . Small vessel disease ([place])     Right basal ganglia stroke  . History of alcohol abuse   . Sciatica neuralgia   . PAF (paroxysmal atrial fibrillation) ([place]) 03/27/2015    a. Myoview neg for ischemia >> Flecainide started 10/16 >> FU ETT   . History of nuclear stress test     Myoview 10/16: EF 50%, diaphragmatic attenuation, no ischemia, low risk   Past Surgical History  Procedure Laterality Date  . Intercostal nerve block  2005  . Surgery back of head  1996-97    Current Outpatient Prescriptions  Medication Sig Dispense Refill  . ABILIFY 15 MG tablet Take 15 mg by mouth daily.  1  . acetaminophen-codeine (TYLENOL #3) 300-30 MG tablet Take 1-2 tablets by mouth every 8 (eight) hours as needed for moderate pain. 60 tablet 0  . apixaban (ELIQUIS) 5 MG TABS tablet Take 1 tablet (5 mg total) by mouth 2 (two) times  daily. 60 tablet 6  . atenolol (TENORMIN) 50 MG tablet Take 1 tablet (50 mg total) by mouth 2 (two) times daily. 60 tablet 3  . carbamazepine (TEGRETOL) 200 MG tablet Take 200 mg by mouth 2 (two) times daily.    . CYMBALTA 60 MG capsule Take 60 mg by mouth daily.  1  . diltiazem (CARDIZEM CD) 180 MG 24 hr capsule Take 1 capsule (180 mg total) by mouth daily. 90 capsule 3  . fluticasone (FLONASE) 50 MCG/ACT nasal spray Place 2 sprays into both nostrils daily. 16 g 0  . hydrALAZINE (APRESOLINE) 25 MG tablet Take 1 tablet (25 mg total) by mouth 3 (three) times daily. 90 tablet 11  . levETIRAcetam (KEPPRA) 250 MG tablet 1 po bid x7 days, then increase to 2 po bid    . omeprazole (PRILOSEC) 20 MG capsule Take 1 capsule (20 mg total) by mouth daily. 30 capsule 2  . triamcinolone ointment (KENALOG) 0.5 % Apply 1 application topically 2 (two) times daily. 30 g 0  . trimethoprim-polymyxin b (POLYTRIM) ophthalmic solution Place 1 drop into the right eye every 4 (four) hours. 10 mL 0   No current facility-administered medications for this encounter.    Allergies  Allergen Reactions  . Lisinopril Anaphylaxis    Swelling of lips and tongue.    Social History   Social History  . Marital Status: Single    Spouse Name:  Jason Stokes  . Number of Children: 3  . Years of Education: HS   Occupational History  .      disabled   Social History Main Topics  . Smoking status: Current Some Day Smoker -- 1.00 packs/day    Types: Cigarettes  . Smokeless tobacco: Never Used     Comment: smoking 3 cigs/day  . Alcohol Use: No     Comment: History of alcohol abuse, marijuana use, cocaine use  . Drug Use: Yes    Special: Marijuana     Comment: Stated never used Cocaine   . Sexual Activity: Yes   Other Topics Concern  . Not on file   Social History Narrative   Patient lives at home with Jason Stokes.    Patient has 3 children 2 step.    Patient has 13 years of schooling.    Patient is right handed.      Family History  Problem Relation Age of Onset  . Heart disease Father   . Schizophrenia Sister     ROS- All systems are reviewed and negative except as per the HPI above  Physical Exam: Filed Vitals:   09/03/15 0954  BP: 136/82  Pulse: 112  Height: 6\' 1"  (1.854 m)  Weight: 273 lb 3.2 oz (123.923 kg)    GEN- The patient is well appearing, alert and oriented x 3 today.   Head- normocephalic, atraumatic Eyes-  Sclera clear, conjunctiva pink Ears- hearing intact Oropharynx- clear Neck- supple, no JVP Lymph- no cervical lymphadenopathy Lungs- Clear to ausculation bilaterally, normal work of breathing Heart- Irregular rate and rhythm, no murmurs, rubs or gallops, PMI not laterally displaced GI- soft, NT, ND, + BS Extremities- no clubbing, cyanosis, or edema MS- no significant deformity or atrophy Skin- no rash or lesion Psych- euthymic mood, full affect Neuro- strength and sensation are intact  EKG- Afib with rvr, qrs int 72 ms, qtc 431 ms Epic records reviewed  Assessment and Plan:  1. Symptomatic persistent afib, with previous failure of flecainide Scheduled for ablation 3/7 with Dr. Curt Bears Continue eliquis without missed doses Explained procedure to pt again and answered questions Increase atenolol to 20 mg bid for better rate control He is aware of dates for pending cardiac ct and labs proceeding ablation  F/u afib clinic as needed  Jason Stokes, [facility] [address] [place], [place] [address] [phone]

## 2015-09-11 ENCOUNTER — Other Ambulatory Visit (INDEPENDENT_AMBULATORY_CARE_PROVIDER_SITE_OTHER): Payer: Medicaid Other | Admitting: *Deleted

## 2015-09-11 DIAGNOSIS — I480 Paroxysmal atrial fibrillation: Secondary | ICD-10-CM

## 2015-09-11 DIAGNOSIS — Z01812 Encounter for preprocedural laboratory examination: Secondary | ICD-10-CM

## 2015-09-11 LAB — BASIC METABOLIC PANEL
BUN: 12 mg/dL (ref 7–25)
CO2: 21 mmol/L (ref 20–31)
Calcium: 9.1 mg/dL (ref 8.6–10.3)
Chloride: 102 mmol/L (ref 98–110)
Creat: 1.33 mg/dL (ref 0.70–1.33)
Glucose, Bld: 198 mg/dL — ABNORMAL HIGH (ref 65–99)
Potassium: 3.9 mmol/L (ref 3.5–5.3)
Sodium: 136 mmol/L (ref 135–146)

## 2015-09-11 LAB — CBC WITH DIFFERENTIAL/PLATELET
Basophils Absolute: 0 10*3/uL (ref 0.0–0.1)
Basophils Relative: 0 % (ref 0–1)
Eosinophils Absolute: 0.2 10*3/uL (ref 0.0–0.7)
Eosinophils Relative: 3 % (ref 0–5)
HCT: 43.8 % (ref 39.0–52.0)
Hemoglobin: 14.3 g/dL (ref 13.0–17.0)
Lymphocytes Relative: 30 % (ref 12–46)
Lymphs Abs: 1.5 10*3/uL (ref 0.7–4.0)
MCH: 26.2 pg (ref 26.0–34.0)
MCHC: 32.6 g/dL (ref 30.0–36.0)
MCV: 80.4 fL (ref 78.0–100.0)
MPV: 10.6 fL (ref 8.6–12.4)
Monocytes Absolute: 0.3 10*3/uL (ref 0.1–1.0)
Monocytes Relative: 6 % (ref 3–12)
Neutro Abs: 3.1 10*3/uL (ref 1.7–7.7)
Neutrophils Relative %: 61 % (ref 43–77)
Platelets: 185 10*3/uL (ref 150–400)
RBC: 5.45 MIL/uL (ref 4.22–5.81)
RDW: 16.4 % — ABNORMAL HIGH (ref 11.5–15.5)
WBC: 5 10*3/uL (ref 4.0–10.5)

## 2015-09-15 ENCOUNTER — Telehealth: Payer: Self-pay | Admitting: Cardiology

## 2015-09-15 ENCOUNTER — Inpatient Hospital Stay (HOSPITAL_COMMUNITY): Admission: RE | Admit: 2015-09-15 | Payer: Medicaid Other | Source: Ambulatory Visit | Admitting: Nurse Practitioner

## 2015-09-15 NOTE — Telephone Encounter (Signed)
Jason Stokes is calling to get his lab results

## 2015-09-15 NOTE — Telephone Encounter (Signed)
-----   Message from Will [name], MD sent at 09/14/2015  9:37 AM EST ----- Pre procedure labs without abnormalities, no changes necessary.

## 2015-09-15 NOTE — Telephone Encounter (Signed)
Informed patient of results and verbal understanding expressed.   Confirmed with patient procedure is scheduled for 3/7. Patient grateful for call.

## 2015-09-16 ENCOUNTER — Ambulatory Visit (HOSPITAL_COMMUNITY): Admission: RE | Admit: 2015-09-16 | Payer: Medicaid Other | Source: Ambulatory Visit

## 2015-09-18 ENCOUNTER — Encounter: Payer: Medicaid Other | Admitting: Cardiology

## 2015-09-19 NOTE — Progress Notes (Signed)
This encounter was created in error - please disregard.

## 2015-09-21 ENCOUNTER — Encounter (HOSPITAL_COMMUNITY): Payer: Self-pay

## 2015-09-21 ENCOUNTER — Ambulatory Visit (HOSPITAL_COMMUNITY)
Admission: RE | Admit: 2015-09-21 | Discharge: 2015-09-21 | Disposition: A | Discharge status: Home / Self Care | Payer: Medicaid Other | Source: Ambulatory Visit | Attending: Cardiology | Admitting: Cardiology

## 2015-09-21 ENCOUNTER — Other Ambulatory Visit: Payer: Self-pay | Admitting: Cardiology

## 2015-09-21 DIAGNOSIS — I480 Paroxysmal atrial fibrillation: Secondary | ICD-10-CM

## 2015-09-21 MED ORDER — IOHEXOL 350 MG/ML SOLN
80.0000 mL | Freq: Once | INTRAVENOUS | Status: AC | PRN
  Administered 2015-09-21: 80 mL via INTRAVENOUS

## 2015-09-21 NOTE — Anesthesia Preprocedure Evaluation (Addendum)
Anesthesia Evaluation  Patient identified by MRN, date of birth, ID band Patient awake    Reviewed: Allergy & Precautions, H&P , NPO status , Patient's Chart, lab work & pertinent test results, reviewed documented beta blocker date and time   Airway Mallampati: II  TM Distance: >3 FB Neck ROM: Full    Dental  (+) Teeth Intact, Dental Advisory Given   Pulmonary Current Smoker,    breath sounds clear to auscultation       Cardiovascular hypertension, Pt. on medications and Pt. on home beta blockers + dysrhythmias Atrial Fibrillation  Rhythm:Irregular Rate:Normal     Neuro/Psych Depression negative neurological ROS     GI/Hepatic negative GI ROS, Neg liver ROS,   Endo/Other  Morbid obesity  Renal/GU negative Renal ROS  negative genitourinary   Musculoskeletal negative musculoskeletal ROS (+)   Abdominal   Peds negative pediatric ROS (+)  Hematology negative hematology ROS (+)   Anesthesia Other Findings   Reproductive/Obstetrics negative OB ROS                          Anesthesia Physical Anesthesia Plan  ASA: III  Anesthesia Plan: General   Post-op Pain Management:    Induction: Intravenous  Airway Management Planned: Oral ETT  Additional Equipment:   Intra-op Plan:   Post-operative Plan: Extubation in OR  Informed Consent: I have reviewed the patients History and Physical, chart, labs and discussed the procedure including the risks, benefits and alternatives for the proposed anesthesia with the patient or authorized representative who has indicated his/her understanding and acceptance.   Dental advisory given  Plan Discussed with: CRNA, Anesthesiologist and Surgeon  Anesthesia Plan Comments:       Anesthesia Quick Evaluation

## 2015-09-22 ENCOUNTER — Ambulatory Visit (HOSPITAL_COMMUNITY)
Admission: RE | Admit: 2015-09-22 | Discharge: 2015-09-23 | Disposition: A | Discharge status: Home / Self Care | Payer: Medicaid Other | Source: Ambulatory Visit | Attending: Cardiology | Admitting: Cardiology

## 2015-09-22 ENCOUNTER — Encounter (HOSPITAL_COMMUNITY)
Admission: RE | Disposition: A | Discharge status: Home / Self Care | Payer: Self-pay | Source: Ambulatory Visit | Attending: Cardiology

## 2015-09-22 ENCOUNTER — Ambulatory Visit (HOSPITAL_COMMUNITY): Payer: Medicaid Other | Admitting: Anesthesiology

## 2015-09-22 ENCOUNTER — Encounter (HOSPITAL_COMMUNITY): Payer: Self-pay | Admitting: Anesthesiology

## 2015-09-22 ENCOUNTER — Ambulatory Visit (HOSPITAL_COMMUNITY): Payer: Medicaid Other

## 2015-09-22 DIAGNOSIS — I8290 Acute embolism and thrombosis of unspecified vein: Secondary | ICD-10-CM

## 2015-09-22 DIAGNOSIS — Z87891 Personal history of nicotine dependence: Secondary | ICD-10-CM | POA: Insufficient documentation

## 2015-09-22 DIAGNOSIS — Z8673 Personal history of transient ischemic attack (TIA), and cerebral infarction without residual deficits: Secondary | ICD-10-CM | POA: Diagnosis not present

## 2015-09-22 DIAGNOSIS — Z538 Procedure and treatment not carried out for other reasons: Secondary | ICD-10-CM | POA: Diagnosis not present

## 2015-09-22 DIAGNOSIS — I748 Embolism and thrombosis of other arteries: Secondary | ICD-10-CM | POA: Insufficient documentation

## 2015-09-22 DIAGNOSIS — Z6836 Body mass index (BMI) 36.0-36.9, adult: Secondary | ICD-10-CM | POA: Diagnosis not present

## 2015-09-22 DIAGNOSIS — I481 Persistent atrial fibrillation: Secondary | ICD-10-CM | POA: Diagnosis not present

## 2015-09-22 DIAGNOSIS — I639 Cerebral infarction, unspecified: Secondary | ICD-10-CM

## 2015-09-22 DIAGNOSIS — I10 Essential (primary) hypertension: Secondary | ICD-10-CM | POA: Insufficient documentation

## 2015-09-22 DIAGNOSIS — G8929 Other chronic pain: Secondary | ICD-10-CM | POA: Diagnosis not present

## 2015-09-22 DIAGNOSIS — M549 Dorsalgia, unspecified: Secondary | ICD-10-CM | POA: Insufficient documentation

## 2015-09-22 HISTORY — DX: Bipolar disorder, unspecified: F31.9

## 2015-09-22 HISTORY — DX: Other chronic pain: G89.29

## 2015-09-22 HISTORY — DX: Low back pain, unspecified: M54.50

## 2015-09-22 HISTORY — DX: Cerebral infarction, unspecified: I63.9

## 2015-09-22 HISTORY — DX: Low back pain: M54.5

## 2015-09-22 HISTORY — PX: ATRIAL FIBRILLATION ABLATION: SHX5732

## 2015-09-22 HISTORY — DX: Anxiety disorder, unspecified: F41.9

## 2015-09-22 HISTORY — DX: Schizophrenia, unspecified: F20.9

## 2015-09-22 HISTORY — DX: Migraine, unspecified, not intractable, without status migrainosus: G43.909

## 2015-09-22 HISTORY — DX: Gastro-esophageal reflux disease without esophagitis: K21.9

## 2015-09-22 HISTORY — PX: ELECTROPHYSIOLOGIC STUDY: SHX172A

## 2015-09-22 LAB — POCT ACTIVATED CLOTTING TIME
Activated Clotting Time: 338 seconds
Activated Clotting Time: 276 seconds
Activated Clotting Time: 240 seconds
Activated Clotting Time: 142 seconds

## 2015-09-22 SURGERY — ATRIAL FIBRILLATION ABLATION
Anesthesia: General

## 2015-09-22 MED ORDER — ONDANSETRON HCL 4 MG/2ML IJ SOLN: 4.0000 mg | Freq: Four times a day (QID) | INTRAMUSCULAR | Status: DC | PRN

## 2015-09-22 MED ORDER — FENTANYL CITRATE (PF) 100 MCG/2ML IJ SOLN: 25.0000 ug | INTRAMUSCULAR | Status: DC | PRN

## 2015-09-22 MED ORDER — HEPARIN (PORCINE) IN NACL 2-0.9 UNIT/ML-% IJ SOLN
INTRAMUSCULAR | Status: DC | PRN
  Administered 2015-09-22: 08:00:00

## 2015-09-22 MED ORDER — LEVETIRACETAM 500 MG PO TABS
500.0000 mg | ORAL_TABLET | Freq: Two times a day (BID) | ORAL | Status: DC
  Administered 2015-09-22: 500 mg via ORAL
  Filled 2015-09-22 (×3): qty 1

## 2015-09-22 MED ORDER — BUPIVACAINE HCL (PF) 0.25 % IJ SOLN
INTRAMUSCULAR | Status: AC
  Filled 2015-09-22: qty 30

## 2015-09-22 MED ORDER — PANTOPRAZOLE SODIUM 40 MG PO TBEC
40.0000 mg | DELAYED_RELEASE_TABLET | Freq: Every day | ORAL | Status: DC
  Administered 2015-09-22: 40 mg via ORAL
  Filled 2015-09-22: qty 1

## 2015-09-22 MED ORDER — DILTIAZEM HCL ER COATED BEADS 180 MG PO CP24: 180.0000 mg | ORAL_CAPSULE | Freq: Every day | ORAL | Status: DC

## 2015-09-22 MED ORDER — HEPARIN (PORCINE) IN NACL 2-0.9 UNIT/ML-% IJ SOLN
INTRAMUSCULAR | Status: AC
  Filled 2015-09-22: qty 500

## 2015-09-22 MED ORDER — PROPOFOL 10 MG/ML IV BOLUS
INTRAVENOUS | Status: DC | PRN
  Administered 2015-09-22: 200 mg via INTRAVENOUS

## 2015-09-22 MED ORDER — DULOXETINE HCL 60 MG PO CPEP
60.0000 mg | ORAL_CAPSULE | Freq: Every day | ORAL | Status: DC
  Filled 2015-09-22 (×2): qty 1

## 2015-09-22 MED ORDER — APIXABAN 5 MG PO TABS
5.0000 mg | ORAL_TABLET | Freq: Two times a day (BID) | ORAL | Status: DC
  Administered 2015-09-22: 5 mg via ORAL
  Filled 2015-09-22: qty 1

## 2015-09-22 MED ORDER — SODIUM CHLORIDE 0.9 % IV SOLN: 250.0000 mL | INTRAVENOUS | Status: DC | PRN

## 2015-09-22 MED ORDER — PHENYLEPHRINE HCL 10 MG/ML IJ SOLN
10.0000 mg | INTRAVENOUS | Status: DC | PRN
  Administered 2015-09-22: 10 ug/min via INTRAVENOUS

## 2015-09-22 MED ORDER — PROPOFOL 500 MG/50ML IV EMUL
INTRAVENOUS | Status: DC | PRN
  Administered 2015-09-22: 40 ug/kg/min via INTRAVENOUS

## 2015-09-22 MED ORDER — HEPARIN SODIUM (PORCINE) 1000 UNIT/ML IJ SOLN
INTRAMUSCULAR | Status: DC | PRN
  Administered 2015-09-22: 3000 [IU] via INTRAVENOUS
  Administered 2015-09-22: 14000 [IU] via INTRAVENOUS

## 2015-09-22 MED ORDER — ACETAMINOPHEN-CODEINE #3 300-30 MG PO TABS: 1.0000 | ORAL_TABLET | Freq: Three times a day (TID) | ORAL | Status: DC | PRN

## 2015-09-22 MED ORDER — ACETAMINOPHEN 325 MG PO TABS: 650.0000 mg | ORAL_TABLET | ORAL | Status: DC | PRN

## 2015-09-22 MED ORDER — HEPARIN SODIUM (PORCINE) 1000 UNIT/ML IJ SOLN
INTRAMUSCULAR | Status: AC
  Filled 2015-09-22: qty 1

## 2015-09-22 MED ORDER — HEPARIN SODIUM (PORCINE) 1000 UNIT/ML IJ SOLN
INTRAMUSCULAR | Status: DC | PRN
  Administered 2015-09-22: 3000 [IU] via INTRAVENOUS
  Administered 2015-09-22: 1000 [IU] via INTRAVENOUS

## 2015-09-22 MED ORDER — FLUTICASONE PROPIONATE 50 MCG/ACT NA SUSP
2.0000 | Freq: Every day | NASAL | Status: DC | PRN
  Filled 2015-09-22: qty 16

## 2015-09-22 MED ORDER — ACETAMINOPHEN 325 MG PO TABS
650.0000 mg | ORAL_TABLET | ORAL | Status: DC | PRN
  Administered 2015-09-22: 18:00:00 650 mg via ORAL
  Filled 2015-09-22: qty 2

## 2015-09-22 MED ORDER — NEOSTIGMINE METHYLSULFATE 10 MG/10ML IV SOLN
INTRAVENOUS | Status: DC | PRN
  Administered 2015-09-22: 5 mg via INTRAVENOUS

## 2015-09-22 MED ORDER — HYDRALAZINE HCL 25 MG PO TABS
25.0000 mg | ORAL_TABLET | Freq: Three times a day (TID) | ORAL | Status: DC
  Administered 2015-09-22: 21:00:00 25 mg via ORAL
  Filled 2015-09-22: qty 1

## 2015-09-22 MED ORDER — LIDOCAINE HCL (CARDIAC) 20 MG/ML IV SOLN
INTRAVENOUS | Status: DC | PRN
  Administered 2015-09-22: 80 mg via INTRATRACHEAL

## 2015-09-22 MED ORDER — HYDROCODONE-ACETAMINOPHEN 5-325 MG PO TABS
1.0000 | ORAL_TABLET | Freq: Four times a day (QID) | ORAL | Status: DC | PRN
  Administered 2015-09-22: 1 via ORAL
  Filled 2015-09-22: qty 1

## 2015-09-22 MED ORDER — SODIUM CHLORIDE 0.9% FLUSH: 3.0000 mL | Freq: Two times a day (BID) | INTRAVENOUS | Status: DC

## 2015-09-22 MED ORDER — MIDAZOLAM HCL 5 MG/5ML IJ SOLN
INTRAMUSCULAR | Status: DC | PRN
  Administered 2015-09-22: 2 mg via INTRAVENOUS

## 2015-09-22 MED ORDER — CARBAMAZEPINE 200 MG PO TABS
200.0000 mg | ORAL_TABLET | Freq: Two times a day (BID) | ORAL | Status: DC
  Administered 2015-09-22: 21:00:00 200 mg via ORAL
  Filled 2015-09-22 (×3): qty 1

## 2015-09-22 MED ORDER — SODIUM CHLORIDE 0.9% FLUSH: 3.0000 mL | INTRAVENOUS | Status: DC | PRN

## 2015-09-22 MED ORDER — SODIUM CHLORIDE 0.9% FLUSH
3.0000 mL | Freq: Two times a day (BID) | INTRAVENOUS | Status: DC
  Administered 2015-09-22: 3 mL via INTRAVENOUS

## 2015-09-22 MED ORDER — FENTANYL CITRATE (PF) 250 MCG/5ML IJ SOLN
INTRAMUSCULAR | Status: DC | PRN
  Administered 2015-09-22 (×5): 50 ug via INTRAVENOUS

## 2015-09-22 MED ORDER — ROCURONIUM BROMIDE 100 MG/10ML IV SOLN
INTRAVENOUS | Status: DC | PRN
  Administered 2015-09-22: 10 mg via INTRAVENOUS
  Administered 2015-09-22: 20 mg via INTRAVENOUS

## 2015-09-22 MED ORDER — SODIUM CHLORIDE 0.9 % IV SOLN
INTRAVENOUS | Status: DC | PRN
  Administered 2015-09-22: 08:00:00 via INTRAVENOUS

## 2015-09-22 MED ORDER — PROTAMINE SULFATE 10 MG/ML IV SOLN
30.0000 mg | Freq: Once | INTRAVENOUS | Status: AC
  Administered 2015-09-22: 30 mg via INTRAVENOUS
  Filled 2015-09-22: qty 5

## 2015-09-22 MED ORDER — GLYCOPYRROLATE 0.2 MG/ML IJ SOLN
INTRAMUSCULAR | Status: DC | PRN
  Administered 2015-09-22: 0.6 mg via INTRAVENOUS

## 2015-09-22 MED ORDER — ARIPIPRAZOLE 5 MG PO TABS
15.0000 mg | ORAL_TABLET | Freq: Every day | ORAL | Status: DC
  Filled 2015-09-22: qty 1

## 2015-09-22 MED ORDER — BUPIVACAINE HCL (PF) 0.25 % IJ SOLN
INTRAMUSCULAR | Status: DC | PRN
  Administered 2015-09-22: 30 mL

## 2015-09-22 MED ORDER — SUCCINYLCHOLINE CHLORIDE 20 MG/ML IJ SOLN
INTRAMUSCULAR | Status: DC | PRN
  Administered 2015-09-22: 120 mg via INTRAVENOUS

## 2015-09-22 MED ORDER — FENTANYL CITRATE (PF) 100 MCG/2ML IJ SOLN
25.0000 ug | INTRAMUSCULAR | Status: DC | PRN
  Administered 2015-09-22 (×3): 50 ug via INTRAVENOUS
  Filled 2015-09-22: qty 2

## 2015-09-22 MED ORDER — ATENOLOL 50 MG PO TABS
50.0000 mg | ORAL_TABLET | Freq: Two times a day (BID) | ORAL | Status: DC
  Administered 2015-09-22: 50 mg via ORAL
  Filled 2015-09-22: qty 1

## 2015-09-22 MED ORDER — POLYMYXIN B-TRIMETHOPRIM 10000-0.1 UNIT/ML-% OP SOLN
1.0000 [drp] | OPHTHALMIC | Status: DC
  Administered 2015-09-22: 22:00:00 1 [drp] via OPHTHALMIC
  Filled 2015-09-22: qty 10

## 2015-09-22 MED ORDER — IOHEXOL 350 MG/ML SOLN
50.0000 mL | Freq: Once | INTRAVENOUS | Status: AC | PRN
  Administered 2015-09-22: 50 mL via INTRAVENOUS

## 2015-09-22 SURGICAL SUPPLY — 21 items
BAG SNAP BAND KOVER 36X36 (MISCELLANEOUS) ×2 IMPLANT
BLANKET WARM UNDERBOD FULL ACC (MISCELLANEOUS) ×2 IMPLANT
CATH NAVISTAR SMARTTOUCH DF (ABLATOR) ×1 IMPLANT
CATH SOUNDSTAR 3D IMAGING (CATHETERS) ×1 IMPLANT
CATH VARIABLE LASSO NAV 2515 (CATHETERS) ×2 IMPLANT
CATH WEBSTER BI DIR CS D-F CRV (CATHETERS) ×2 IMPLANT
COVER SWIFTLINK CONNECTOR (BAG) ×2 IMPLANT
NEEDLE TRANSSEPTAL BRK 98CM (NEEDLE) ×2 IMPLANT
PACK EP LATEX FREE (CUSTOM PROCEDURE TRAY) ×2
PACK EP LF (CUSTOM PROCEDURE TRAY) ×1 IMPLANT
PAD DEFIB LIFELINK (PAD) ×2 IMPLANT
PATCH CARTO3 (PAD) ×1 IMPLANT
SHEATH AGILIS NXT 8.5F 71CM (SHEATH) ×4 IMPLANT
SHEATH AVANTI 11F 11CM (SHEATH) ×1 IMPLANT
SHEATH PINNACLE 7F 10CM (SHEATH) ×2 IMPLANT
SHEATH PINNACLE 8F 10CM (SHEATH) ×4 IMPLANT
SHEATH PINNACLE 9F 10CM (SHEATH) ×4 IMPLANT
SHIELD RADPAD SCOOP 12X17 (MISCELLANEOUS) ×2 IMPLANT
TUBING ART PRESS 72  MALE/FEM (TUBING) ×2
TUBING ART PRESS 72 MALE/FEM (TUBING) ×2 IMPLANT
TUBING SMART ABLATE COOLFLOW (TUBING) ×2 IMPLANT

## 2015-09-22 NOTE — Anesthesia Procedure Notes (Signed)
Procedure Name: Intubation Date/Time: 09/22/2015 8:09 AM Performed by: Maude Leriche D Pre-anesthesia Checklist: Patient identified, Emergency Drugs available, Suction available, Patient being monitored and Timeout performed Patient Re-evaluated:Patient Re-evaluated prior to inductionOxygen Delivery Method: Circle system utilized Preoxygenation: Pre-oxygenation with 100% oxygen Intubation Type: IV induction Ventilation: Mask ventilation without difficulty, Oral airway inserted - appropriate to patient size and Two handed mask ventilation required Laryngoscope Size: Mac and 4 Grade View: Grade II Tube type: Oral Tube size: 7.5 mm Number of attempts: 1 Airway Equipment and Method: Stylet Placement Confirmation: ETT inserted through vocal cords under direct vision,  positive ETCO2 and breath sounds checked- equal and bilateral Secured at: 23 cm Tube secured with: Tape Dental Injury: Teeth and Oropharynx as per pre-operative assessment

## 2015-09-22 NOTE — Anesthesia Postprocedure Evaluation (Signed)
Anesthesia Post Note  [name]  Procedure(s) Performed: Procedure(s) (LRB): Atrial Fibrillation Ablation (N/A)  Patient location during evaluation: PACU Anesthesia Type: General Level of consciousness: awake and alert Pain management: pain level controlled Vital Signs Assessment: post-procedure vital signs reviewed and stable Respiratory status: spontaneous breathing, nonlabored ventilation, respiratory function stable and patient connected to nasal cannula oxygen Cardiovascular status: blood pressure returned to baseline and stable Postop Assessment: no signs of nausea or vomiting Anesthetic complications: no    Last Vitals:  Filed Vitals:   09/22/15 1100 09/22/15 1200  BP: 134/90 114/84  Pulse:  75  Temp:    Resp:  20    Last Pain:  Filed Vitals:   09/22/15 1204  PainSc: 0-No pain                 Marija Calamari,W. EDMOND

## 2015-09-22 NOTE — Progress Notes (Signed)
Wasted Fentanyl 121mcg with Ashley Akin RN in sharps container in pixis room.

## 2015-09-22 NOTE — Transfer of Care (Signed)
Immediate Anesthesia Transfer of Care Note  [name]  Procedure(s) Performed: Procedure(s): Atrial Fibrillation Ablation (N/A)  Patient Location: PACU  Anesthesia Type:General  Level of Consciousness: awake  Airway & Oxygen Therapy: Patient Spontanous Breathing and Patient connected to face mask oxygen  Post-op Assessment: Report given to RN and Post -op Vital signs reviewed and stable  Post vital signs: Reviewed and stable  Last Vitals:  Filed Vitals:   09/22/15 1030 09/22/15 1045  BP: 107/81 79/59  Pulse:    Temp: 36.2 C   Resp: 12     Complications: No apparent anesthesia complications

## 2015-09-22 NOTE — H&P (Signed)
Jason Stokes presents today for follow up of atrial fibrillation.  Plan for AF ablation.  Had CT done yesterday without LAA thrombus.  Plan for PVI.  Explained risks and benefits of the procedure.  Risks include bleeding, tamponade, stroke, heart block, and damage to surrounding organs.  The patient understands the risks and has agreed to the procedure.    [name], MD 09/22/2015 7:08 AM

## 2015-09-23 ENCOUNTER — Encounter (HOSPITAL_COMMUNITY): Payer: Self-pay | Admitting: Cardiology

## 2015-09-23 DIAGNOSIS — M549 Dorsalgia, unspecified: Secondary | ICD-10-CM | POA: Diagnosis not present

## 2015-09-23 DIAGNOSIS — I481 Persistent atrial fibrillation: Secondary | ICD-10-CM

## 2015-09-23 DIAGNOSIS — G8929 Other chronic pain: Secondary | ICD-10-CM | POA: Diagnosis not present

## 2015-09-23 DIAGNOSIS — I10 Essential (primary) hypertension: Secondary | ICD-10-CM | POA: Diagnosis not present

## 2015-09-23 MED ORDER — OXYCODONE HCL 5 MG PO TABS
5.0000 mg | ORAL_TABLET | ORAL | Status: DC | PRN
  Administered 2015-09-23: 09:00:00 5 mg via ORAL
  Filled 2015-09-23: qty 1

## 2015-09-23 MED ORDER — AMIODARONE HCL 200 MG PO TABS: 200.0000 mg | ORAL_TABLET | Freq: Two times a day (BID) | ORAL | Status: DC

## 2015-09-23 MED ORDER — OXYCODONE HCL 5 MG PO TABS: 5.0000 mg | ORAL_TABLET | Freq: Four times a day (QID) | ORAL | Status: DC | PRN

## 2015-09-23 NOTE — Discharge Summary (Signed)
ELECTROPHYSIOLOGY PROCEDURE DISCHARGE SUMMARY    Patient ID: Jason Stokes,  MRN: [id number], DOB/AGE: Nov 27, 1960 55 y.o.  Admit date: 09/22/2015 Discharge date: 09/23/2015  Primary Care Physician: [name], MD Primary Cardiologist: Der. Turner Electrophysiologist: Dr. Curt Bears  Primary Discharge Diagnosis:  Persistent Atrial Fibrillation CHA2DS2Vasc is at least 4 on Eliquis  Secondary Discharge Diagnosis:  1. Chronic back pain 2. HTN 3. Obesity 4. Remote CVA  Procedures This Admission:  1.  Aborted Electrophysiology study/radiofrequency catheter ablation on 09/22/15 by Dr Curt Bears.   This study demonstrated  CONCLUSIONS: 1. Atrial fibrillation upon presentation.  2. Blood clot found on transseptal sheath in the left atrium. Procedure canceled and CT performed showing no acute clot.  Brief HPI: Jason Stokes is a 55 y.o. male with a history of persistent atrial fibrillation.  They have failed medical therapy with Flecainide. Risks, benefits, and alternatives to catheter ablation of atrial fibrillation were reviewed with the patient who wished to proceed.  The patient underwent Cardiac CT prior to the procedure which demonstrated no LAA thrombus.    Hospital Course:  The patient was admitted and the procedure for EPS/RFCA of atrial fibrillation was started but aborted with finding of clot on the transseptal sheath as noted above.  The patient was brought directly to CT which showed Negative CTA head and neck; no emergent large vessel occlusion or thromboembolic disease identified.  He was examined by Dr. Curt Bears post procedure once awake without neuro deficits appreciated.   He was monitored on telemetry overnight which demonstrated SR.   Groin sites were without complication on the day of discharge.  The patient reports an acute flare up of his chronic back pain here since being in bed, reports that he sees a pain management/back specialist and is being considered for a  stimulator implant, requests pain medication to go home with that he is out of his prescription currently.  Dr. Curt Bears ordered for him Oxycodone 5mg  Q6 hours for moderate-severe pain, 8 tablets/2 day supply only and the patient is instructed that he needs to contact his PMD or pain specialist/back specialist for any further pain medication/management. He is cautioned/intsructed not to drive when taking pain medicine.  We are starting amiodarone he is in SR today.  The patient is instructed on how we are dosing 200mg  BID for 1 week, then daily until he is seen in f/u.  The patient was seen and examined by Dr. Curt Bears and considered to be stable for discharge.  Wound care and restrictions were reviewed with the patient.  The patient Jason Stokes be seen back by Dr. Curt Bears 3-4 weeks to f/u and discuss POC and re-schedule his procedure.. The patient states he has quit smoking iscounseled regarding reported use of marijuana.   Physical Exam: Filed Vitals:   09/22/15 2000 09/22/15 2100 09/23/15 0510 09/23/15 0819  BP: 147/80 126/88 102/57 105/56  Pulse: 29 53 107 58  Temp:   98 F (36.7 C) 98 F (36.7 C)  TempSrc:   Oral Oral  Resp: 23  19 15   Height:      Weight:   276 lb 0.3 oz (125.2 kg)   SpO2: 100% 99% 95%     GEN- The patient is obese, well appearing, alert and oriented x 3 today.   HEENT: normocephalic, atraumatic; sclera clear, conjunctiva pink; hearing intact; oropharynx clear; neck supple  Lungs- Clear to ausculation bilaterally, normal work of breathing.  No wheezes, rales, rhonchi Heart- Regular rate and rhythm, no murmurs,  rubs or gallops  GI- soft, non-tender, non-distended, bowel sounds present  Extremities- no clubbing, cyanosis, or edema; DP/PT/radial pulses 2+ bilaterally, groin without hematoma/bruit MS- no significant deformity or atrophy Skin- warm and dry, no rash or lesion Psych- euthymic mood, full affect Neuro- strength and sensation are intact, no gross deficits are  appreciated   Labs:   Lab Results  Component Value Date   WBC 5.0 09/11/2015   HGB 14.3 09/11/2015   HCT 43.8 09/11/2015   MCV 80.4 09/11/2015   PLT 185 09/11/2015   No results for input(s): NA, K, CL, CO2, BUN, CREATININE, CALCIUM, PROT, BILITOT, ALKPHOS, ALT, AST, GLUCOSE in the last 168 hours.  Invalid input(s): LABALBU   Discharge Medications:    Medication List    TAKE these medications        ABILIFY 15 MG tablet  Generic drug:  ARIPiprazole  Take 15 mg by mouth daily.     acetaminophen-codeine 300-30 MG tablet  Commonly known as:  TYLENOL #3  Take 1-2 tablets by mouth every 8 (eight) hours as needed for moderate pain.     amiodarone 200 MG tablet  Commonly known as:  PACERONE  Take 1 tablet (200 mg total) by mouth 2 (two) times daily. Take 200mg  (1 tablet) by mouth twice daily for one week, then decrease to 200mg  (1 tablet) once daily     apixaban 5 MG Tabs tablet  Commonly known as:  ELIQUIS  Take 1 tablet (5 mg total) by mouth 2 (two) times daily.     atenolol 50 MG tablet  Commonly known as:  TENORMIN  Take 1 tablet (50 mg total) by mouth 2 (two) times daily.     carbamazepine 200 MG tablet  Commonly known as:  TEGRETOL  Take 200 mg by mouth 2 (two) times daily.     CYMBALTA 60 MG capsule  Generic drug:  DULoxetine  Take 60 mg by mouth daily.     diltiazem 180 MG 24 hr capsule  Commonly known as:  CARDIZEM CD  Take 1 capsule (180 mg total) by mouth daily.     fluticasone 50 MCG/ACT nasal spray  Commonly known as:  FLONASE  Place 2 sprays into both nostrils daily.     hydrALAZINE 25 MG tablet  Commonly known as:  APRESOLINE  Take 1 tablet (25 mg total) by mouth 3 (three) times daily.     levETIRAcetam 250 MG tablet  Commonly known as:  KEPPRA  Take 500 mg by mouth 2 (two) times daily.     omeprazole 20 MG capsule  Commonly known as:  PRILOSEC  Take 1 capsule (20 mg total) by mouth daily.     oxyCODONE 5 MG immediate release tablet    Commonly known as:  ROXICODONE  Take 1 tablet (5 mg total) by mouth every 6 (six) hours as needed for severe pain.  Notes to Patient:  You must see your pain specialist/primary care physician for further pain medication/pain management     triamcinolone ointment 0.5 %  Commonly known as:  KENALOG  Apply 1 application topically 2 (two) times daily.     trimethoprim-polymyxin b ophthalmic solution  Commonly known as:  POLYTRIM  Place 1 drop into the right eye every 4 (four) hours.        Disposition: Home Discharge Instructions    Diet - low sodium heart healthy    Complete by:  As directed      Increase activity slowly  Complete by:  As directed           Follow-up Information    Follow up with Alim Cattell [name], MD On 10/20/2015.   Specialty:  Cardiology   Why:  12:00noon   Contact information:   [place] [address] [phone]       Duration of Discharge Encounter: Greater than 30 minutes including physician time.  Venetia Night, PA-C  09/23/2015 9:58 AM   I have seen and examined this patient with Tommye Standard.  Agree with above, note added to reflect my findings.  On exam, regular rhythm, no murmurs, lungs clear.  Had attempt at AF ablation but was found to have clot in LAA after crossing.  Aborted the procedure and did CTA head and neck which was negative for stroke.  Patient doing well and in sinus rhythm this morning.  Started on amiodarone and Gema Ringold follow up in clinic in 3 weeks.    Graylen Noboa M. Ardean Melroy MD 09/23/2015 3:40 PM

## 2015-09-23 NOTE — Discharge Instructions (Addendum)
No driving for 4 days. No lifting over 5 lbs for 1 week. No vigorous or sexual activity for 1 week. You may return to work : (patient reports he does not work). Keep procedure site clean & dry. If you notice increased pain, swelling, bleeding or pus, call/return!  You may shower, but no soaking baths/hot tubs/pools for 1 week.      Information on my medicine - ELIQUIS (apixaban)  This medication education was reviewed with me or my healthcare representative as part of my discharge preparation.  The pharmacist that spoke with me during my hospital stay was:  Wayland Salinas, [facility]  Why was Eliquis prescribed for you? Eliquis was prescribed for you to reduce the risk of a blood clot forming that can cause a stroke if you have a medical condition called atrial fibrillation (a type of irregular heartbeat).  What do You need to know about Eliquis ? Take your Eliquis TWICE DAILY - one tablet in the morning and one tablet in the evening with or without food. If you have difficulty swallowing the tablet whole please discuss with your pharmacist how to take the medication safely.  Take Eliquis exactly as prescribed by your doctor and DO NOT stop taking Eliquis without talking to the doctor who prescribed the medication.  Stopping may increase your risk of developing a stroke.  Refill your prescription before you run out.  After discharge, you should have regular check-up appointments with your healthcare provider that is prescribing your Eliquis.  In the future your dose may need to be changed if your kidney function or weight changes by a significant amount or as you get older.  What do you do if you miss a dose? If you miss a dose, take it as soon as you remember on the same day and resume taking twice daily.  Do not take more than one dose of ELIQUIS at the same time to make up a missed dose.  Important Safety Information A possible side effect of Eliquis is bleeding. You should  call your healthcare provider right away if you experience any of the following: ? Bleeding from an injury or your nose that does not stop. ? Unusual colored urine (red or dark brown) or unusual colored stools (red or black). ? Unusual bruising for unknown reasons. ? A serious fall or if you hit your head (even if there is no bleeding).  Some medicines may interact with Eliquis and might increase your risk of bleeding or clotting while on Eliquis. To help avoid this, consult your healthcare provider or pharmacist prior to using any new prescription or non-prescription medications, including herbals, vitamins, non-steroidal anti-inflammatory drugs (NSAIDs) and supplements.  This website has more information on Eliquis (apixaban): http://www.eliquis.com/eliquis/home

## 2015-09-23 NOTE — Care Management Note (Signed)
Case Management Note  Patient Details  Name: Jason Stokes MRN: [id number] Date of Birth: 12/31/1960  Subjective/Objective:  Patient from home, pta indep, no needs.                   Action/Plan:   Expected Discharge Date:                  Expected Discharge Plan:  Home/Self Care  In-House Referral:     Discharge planning Services  CM Consult  Post Acute Care Choice:    Choice offered to:     DME Arranged:    DME Agency:     HH Arranged:    [place] Agency:     Status of Service:  Completed, signed off  Medicare Important Message Given:    Date Medicare IM Given:    Medicare IM give by:    Date Additional Medicare IM Given:    Additional Medicare Important Message give by:     If discussed at [place] of Stay Meetings, dates discussed:    Additional Comments:  Zenon Mayo, RN 09/23/2015, 12:46 PM

## 2015-10-02 ENCOUNTER — Other Ambulatory Visit: Payer: Self-pay | Admitting: Family Medicine

## 2015-10-08 ENCOUNTER — Ambulatory Visit (HOSPITAL_COMMUNITY): Payer: Medicaid Other | Admitting: Nurse Practitioner

## 2015-10-12 NOTE — Progress Notes (Signed)
This encounter was created in error - please disregard.

## 2015-10-13 ENCOUNTER — Encounter: Payer: Medicaid Other | Admitting: Cardiology

## 2015-10-14 ENCOUNTER — Encounter: Payer: Self-pay | Admitting: Cardiology

## 2015-10-14 ENCOUNTER — Ambulatory Visit: Payer: Medicaid Other | Admitting: Cardiology

## 2015-10-20 ENCOUNTER — Ambulatory Visit: Payer: Medicaid Other | Admitting: Cardiology

## 2015-10-21 NOTE — Progress Notes (Signed)
Electrophysiology Office Note   Date:  10/22/2015   ID:  Jason Stokes, DOB Apr 12, 1961, MRN [id number]  PCP:  [name], MD  Cardiologist:  [name] Primary Electrophysiologist: Will [name], MD    Chief Complaint  Patient presents with  . Follow-up    3 wk post ablation     History of Present Illness: Jason Stokes is a 55 y.o. male who presents today for electrophysiology evaluation.   He is a history of paroxysmal atrial fibrillation, hypertension and remote CVA. He presented to the emergency room for/16 with atrial fibrillation and RVR. He was started on Eliquis L diltiazem at that time. He did not follow-up with cardiology until 03/27/15 when he complained of palpitations. At that time he was in sinus rhythm. Event monitor demonstrated recurrent episodes of atrial fibrillation with heart rates in the 130s. He is a stress test done which was low risk for ischemia. He was started on flecainide for rhythm control. He had follow-up in December in the [facility] at that time he had stopped his flecainide. He was having symptoms such as diaphoresis palpitations and lightheadedness. When he was seen in the A. fib clinic he was feeling better off of his flecainide. He reports only having atrial fibrillation for 20-30 minutes once to twice per week. He had AF ablation performed on 09/22/15. Unfortunately during his procedure, it was noted that he had a clot in his left atrium and the procedure was therefore canceled. He returns today feeling well. He was put on amiodarone after his failed procedure. He is in sinus rhythm today and feeling well. He says that he has much more energy.  Currently, he denies symptoms of palpitations, chest pain, shortness of breath, orthopnea, PND, lower extremity edema, claudication, dizziness, presyncope, syncope, bleeding, or neurologic sequela. The patient is tolerating medications without difficulties and is otherwise without complaint today.      Past Medical History  Diagnosis Date  . Chronic lower back pain   . Hypertension   . Depression   . Small vessel disease ([place])     Right basal ganglia stroke  . History of alcohol abuse   . Sciatica neuralgia   . PAF (paroxysmal atrial fibrillation) ([place]) 03/27/2015    a. Myoview neg for ischemia >> Flecainide started 10/16 >> FU ETT   . History of nuclear stress test     Myoview 10/16: EF 50%, diaphragmatic attenuation, no ischemia, low risk  . GERD (gastroesophageal reflux disease)   . Migraine 2012-2014  . Stroke [facility]) "between 2012-2014"    residual "AF" (09/22/2015)  . Anxiety   . Bipolar disorder ([place])   . Schizophrenia [facility])    Past Surgical History  Procedure Laterality Date  . Intercostal nerve block  2005  . Cyst excision  1996-97    surgery back of head   . Atrial fibrillation ablation  09/22/2015  . Knee arthroscopy Right 2016  . Ganglion cyst excision Left   . Electrophysiologic study N/A 09/22/2015    Procedure: Atrial Fibrillation Ablation;  Surgeon: Will [name], MD;  Location: [facility];  Service: Cardiovascular;  Laterality: N/A;     Current Outpatient Prescriptions  Medication Sig Dispense Refill  . ABILIFY 15 MG tablet Take 15 mg by mouth daily.  1  . acetaminophen-codeine (TYLENOL #3) 300-30 MG tablet Take 1-2 tablets by mouth every 8 (eight) hours as needed for moderate pain. 60 tablet 0  . amiodarone (PACERONE) 200 MG  tablet Take 1 tablet (200 mg total) by mouth 2 (two) times daily. Take 200mg  (1 tablet) by mouth twice daily for one week, then decrease to 200mg  (1 tablet) once daily 60 tablet 3  . apixaban (ELIQUIS) 5 MG TABS tablet Take 1 tablet (5 mg total) by mouth 2 (two) times daily. 60 tablet 6  . atenolol (TENORMIN) 50 MG tablet Take 1 tablet (50 mg total) by mouth 2 (two) times daily. 60 tablet 3  . carbamazepine (TEGRETOL) 200 MG tablet Take 200 mg by mouth 2 (two) times daily.    . CYMBALTA 60 MG capsule Take 60 mg by mouth  daily.  1  . diltiazem (CARDIZEM CD) 180 MG 24 hr capsule Take 1 capsule (180 mg total) by mouth daily. 90 capsule 3  . fluticasone (FLONASE) 50 MCG/ACT nasal spray Place 2 sprays into both nostrils daily. (Patient taking differently: Place 2 sprays into both nostrils daily as needed for allergies. ) 16 g 0  . hydrALAZINE (APRESOLINE) 25 MG tablet Take 1 tablet (25 mg total) by mouth 3 (three) times daily. 90 tablet 11  . levETIRAcetam (KEPPRA) 250 MG tablet Take 500 mg by mouth 2 (two) times daily.    Marland Kitchen omeprazole (PRILOSEC) 20 MG capsule Take 1 capsule (20 mg total) by mouth daily. 30 capsule 2  . triamcinolone ointment (KENALOG) 0.5 % Apply 1 application topically 2 (two) times daily. 30 g 0  . trimethoprim-polymyxin b (POLYTRIM) ophthalmic solution Place 1 drop into the right eye every 4 (four) hours. 10 mL 0   No current facility-administered medications for this visit.    Allergies:   Lisinopril   Social History:  The patient  reports that he quit smoking about 2 months ago. His smoking use included Cigarettes. He has a 29 pack-year smoking history. He has never used smokeless tobacco. He reports that he drinks alcohol. He reports that he uses illicit drugs (Marijuana).   Family History:  The patient's family history includes Heart disease in his father; Schizophrenia in his sister.    ROS:  Please see the history of present illness.   Otherwise, review of systems is positive for chest pain, leg pain, back and muscle pain.   All other systems are reviewed and negative.    PHYSICAL EXAM: VS:  BP 148/104 mmHg  Pulse 58  Ht 6\' 1"  (1.854 m)  Wt 283 lb (128.368 kg)  BMI 37.35 kg/m2 , BMI Body mass index is 37.35 kg/(m^2). GEN: Well nourished, well developed, in no acute distress HEENT: normal Neck: no JVD, carotid bruits, or masses Cardiac: RRR; no murmurs, rubs, or gallops,no edema  Respiratory:  clear to auscultation bilaterally, normal work of breathing GI: soft, nontender,  nondistended, + BS MS: no deformity or atrophy Skin: warm and dry Neuro:  Strength and sensation are intact Psych: euthymic mood, full affect  EKG:  EKG is ordered today. The ekg ordered today shows sinus rhythm, rate 58  Recent Labs: 05/04/2015: TSH 1.823 09/11/2015: BUN 12; Creat 1.33; Hemoglobin 14.3; Platelets 185; Potassium 3.9; Sodium 136    Lipid Panel     Component Value Date/Time   CHOL 174 10/17/2013 1042   TRIG 180* 10/17/2013 1042   HDL 32* 10/17/2013 1042   CHOLHDL 5.4 10/17/2013 1042   VLDL 36 10/17/2013 1042   LDLCALC 106* 10/17/2013 1042     Wt Readings from Last 3 Encounters:  10/22/15 283 lb (128.368 kg)  09/23/15 276 lb 0.3 oz (125.2 kg)  09/03/15 273  lb 3.2 oz (123.923 kg)     Other studies Reviewed: Additional studies/ records that were reviewed today include:  SPECT 05/15/15, TTE 03/31/15 Review of the above records today demonstrates:  SPECT  The left ventricular ejection fraction is mildly decreased (45-54%).  Nuclear stress EF: 50%.  There was no ST segment deviation noted during stress.  Defect 1: Moderate sized, mild in intensity, fixed defect in the mid and basal inferior and basal inferolateral wall consistent with diaphragmatic attenuation. No ischemia noted.  This is a low risk study.  TTE - Normal LV systolic function; grade 1 diastolic dysfunction; mildLAE.  ASSESSMENT AND PLAN:  1.  Paroxysmal atrial fibrillation: Has a chads 2 vasc score of 3 for hypertension and previous stroke. He is on Eliquis for anticoagulation.He had an AF ablation attempt done on 3/7, but was noted to have a clot in his left atrium. The procedure was aborted at that time. At this point, he would like to have another attempt at ablation. I have discussed with him the risks and benefits of the procedure. Risks include bleeding, tamponade, heart block, stroke, and damage to surrounding organs. He understands these risks and wishes to proceed. He is tolerating  his Eliquis well and we will continue that.     This patients CHA2DS2-VASc Score and unadjusted Ischemic Stroke Rate (% per year) is equal to 3.2 % stroke rate/year from a score of 3  Above score calculated as 1 point each if present [CHF, HTN, DM, Vascular=MI/PAD/Aortic Plaque, Age if 65-74, or Male] Above score calculated as 2 points each if present [Age > 75, or Stroke/TIA/TE]  2. Hypertension: I have asked him to take his blood pressure home and call us back with the results in 2 weeks.    Current medicines are reviewed at length with the patient today.   The patient does not have concerns regarding his medicines.  The following changes were made today:  none  Labs/ tests ordered today include:  No orders of the defined types were placed in this encounter.     Disposition:   FU with Will Camnitz post ablation.  Signed, Will [name], MD  10/22/2015 11:07 AM     [facility] [address] [place] [address] [phone] (office) [phone] (fax)

## 2015-10-22 ENCOUNTER — Encounter: Payer: Self-pay | Admitting: Cardiology

## 2015-10-22 ENCOUNTER — Ambulatory Visit (INDEPENDENT_AMBULATORY_CARE_PROVIDER_SITE_OTHER): Payer: Medicaid Other | Admitting: Cardiology

## 2015-10-22 ENCOUNTER — Encounter: Payer: Self-pay | Admitting: *Deleted

## 2015-10-22 VITALS — BP 148/104 | HR 58 | Ht 73.0 in | Wt 283.0 lb

## 2015-10-22 DIAGNOSIS — I480 Paroxysmal atrial fibrillation: Secondary | ICD-10-CM

## 2015-10-22 DIAGNOSIS — Z01812 Encounter for preprocedural laboratory examination: Secondary | ICD-10-CM

## 2015-10-22 NOTE — Patient Instructions (Addendum)
Medication Instructions:  Your physician recommends that you continue on your current medications as directed. Please refer to the Current Medication list given to you today.  Labwork: Your physician recommends that you return for pre procedure lab work in: within 7 days of your procedure on 12/10/15. (CMET, CBCD, & TSH)  Testing/Procedures: Your physician has requested that you have cardiac CT. Cardiac computed tomography (CT) is a painless test that uses an x-ray machine to take clear, detailed pictures of your heart. For further information please visit [email or web address].   -the office will contact you to schedule this once it has been pre certified through your insurance.  - this test will be performed within 7 days of your ablation on 12/10/15.   Your physician has recommended that you have an AFib ablation. Catheter ablation is a medical procedure used to treat some cardiac arrhythmias (irregular heartbeats). During catheter ablation, a long, thin, flexible tube is put into a blood vessel in your groin (upper thigh), or neck. This tube is called an ablation catheter. It is then guided to your heart through the blood vessel. Radio frequency waves destroy small areas of heart tissue where abnormal heartbeats may cause an arrhythmia to start. Please see the instruction sheet given to you today.  Follow-Up: Your physician recommends that you schedule a follow-up appointment in: 4 weeks, after ablation on 12/10/15, with Roderic Palau, NP in the AFib Clinic.  Your physician recommends that you schedule a follow-up appointment in: 3 months, after ablation on 12/10/15, with Dr. Curt Bears.  If you need a refill on your cardiac medications before your next appointment, please call your pharmacy.  Thank you for choosing [facility]!!   Trinidad Curet, RN [phone]   Any Other Special Instructions Will Be Listed Below (If Applicable). Cardiac Ablation Cardiac ablation is a procedure to  disable a small amount of heart tissue in very specific places. The heart has many electrical connections. Sometimes these connections are abnormal and can cause the heart to beat very fast or irregularly. By disabling some of the problem areas, heart rhythm can be improved or made normal. Ablation is done for people who:   Have Wolff-Parkinson-White syndrome.   Have other fast heart rhythms (tachycardia).   Have taken medicines for an abnormal heart rhythm (arrhythmia) that resulted in:   No success.   Side effects.   May have a high-risk heartbeat that could result in death.  LET [facility] CARE PROVIDER KNOW ABOUT:   Any allergies you have or any previous reactions you have had to X-ray dye, food (such as seafood), medicine, or tape.   All medicines you are taking, including vitamins, herbs, eye drops, creams, and over-the-counter medicines.   Previous problems you or members of your family have had with the use of anesthetics.   Any blood disorders you have.   Previous surgeries or procedures (such as a kidney transplant) you have had.   Medical conditions you have (such as kidney failure).  RISKS AND COMPLICATIONS Generally, cardiac ablation is a safe procedure. However, problems can occur and include:   Increased risk of cancer. Depending on how long it takes to do the ablation, the dose of radiation can be high.  Bruising and bleeding where a thin, flexible tube (catheter) was inserted during the procedure.   Bleeding into the chest, especially into the sac that surrounds the heart (serious).  Need for a permanent pacemaker if the normal electrical system is damaged.   The procedure may not  be fully effective, and this may not be recognized for months. Repeat ablation procedures are sometimes required. BEFORE THE PROCEDURE   Follow any instructions from your health care provider regarding eating and drinking before the procedure.   Take your medicines  as directed at regular times with water, unless instructed otherwise by your health care provider. If you are taking diabetes medicine, including insulin, ask how you are to take it and if there are any special instructions you should follow. It is common to adjust insulin dosing the day of the ablation.  PROCEDURE  An ablation is usually performed in a catheterization laboratory with the guidance of fluoroscopy. Fluoroscopy is a type of X-ray that helps your health care provider see images of your heart during the procedure.   An ablation is a minimally invasive procedure. This means a small cut (incision) is made in either your neck or groin. Your health care provider will decide where to make the incision based on your medical history and physical exam.  An IV tube will be started before the procedure begins. You will be given an anesthetic or medicine to help you relax (sedative).  The skin on your neck or groin will be numbed. A needle will be inserted into a large vein in your neck or groin and catheters will be threaded to your heart.  A special dye that shows up on fluoroscopy pictures may be injected through the catheter. The dye helps your health care provider see the area of the heart that needs treatment.  The catheter has electrodes on the tip. When the area of heart tissue that is causing the arrhythmia is found, the catheter tip will send an electrical current to the area and "scar" the tissue. Three types of energy can be used to ablate the heart tissue:   Heat (radiofrequency energy).   Laser energy.   Extreme cold (cryoablation).   When the area of the heart has been ablated, the catheter will be taken out. Pressure will be held on the insertion site. This will help the insertion site clot and keep it from bleeding. A bandage will be placed on the insertion site.  AFTER THE PROCEDURE   After the procedure, you will be taken to a recovery area where your vital signs  (blood pressure, heart rate, and breathing) will be monitored. The insertion site will also be monitored for bleeding.   You will need to lie still for 4-6 hours. This is to ensure you do not bleed from the catheter insertion site.    This information is not intended to replace advice given to you by your health care provider. Make sure you discuss any questions you have with your health care provider.   Document Released: 11/20/2008 Document Revised: 07/25/2014 Document Reviewed: 11/26/2012 Elsevier Interactive Patient Education [employer].

## 2015-10-22 NOTE — Addendum Note (Signed)
Addended by: Stanton Kidney on: 10/22/2015 11:38 AM   Modules accepted: Orders

## 2015-10-23 ENCOUNTER — Telehealth: Payer: Self-pay | Admitting: Cardiology

## 2015-10-23 ENCOUNTER — Ambulatory Visit: Payer: Medicaid Other | Attending: Family Medicine | Admitting: Physician Assistant

## 2015-10-23 ENCOUNTER — Encounter: Payer: Self-pay | Admitting: Cardiology

## 2015-10-23 VITALS — BP 135/90 | HR 77 | Temp 97.7°F | Resp 18 | Ht 73.0 in | Wt 278.2 lb

## 2015-10-23 DIAGNOSIS — M25561 Pain in right knee: Secondary | ICD-10-CM | POA: Diagnosis not present

## 2015-10-23 DIAGNOSIS — Z79899 Other long term (current) drug therapy: Secondary | ICD-10-CM | POA: Insufficient documentation

## 2015-10-23 DIAGNOSIS — M545 Low back pain: Secondary | ICD-10-CM | POA: Insufficient documentation

## 2015-10-23 DIAGNOSIS — G43909 Migraine, unspecified, not intractable, without status migrainosus: Secondary | ICD-10-CM | POA: Insufficient documentation

## 2015-10-23 DIAGNOSIS — G8929 Other chronic pain: Secondary | ICD-10-CM | POA: Insufficient documentation

## 2015-10-23 DIAGNOSIS — K219 Gastro-esophageal reflux disease without esophagitis: Secondary | ICD-10-CM | POA: Insufficient documentation

## 2015-10-23 DIAGNOSIS — F319 Bipolar disorder, unspecified: Secondary | ICD-10-CM | POA: Insufficient documentation

## 2015-10-23 DIAGNOSIS — F209 Schizophrenia, unspecified: Secondary | ICD-10-CM | POA: Insufficient documentation

## 2015-10-23 DIAGNOSIS — Z888 Allergy status to other drugs, medicaments and biological substances status: Secondary | ICD-10-CM | POA: Diagnosis not present

## 2015-10-23 DIAGNOSIS — F419 Anxiety disorder, unspecified: Secondary | ICD-10-CM | POA: Diagnosis not present

## 2015-10-23 DIAGNOSIS — Z7901 Long term (current) use of anticoagulants: Secondary | ICD-10-CM | POA: Diagnosis not present

## 2015-10-23 DIAGNOSIS — Z8673 Personal history of transient ischemic attack (TIA), and cerebral infarction without residual deficits: Secondary | ICD-10-CM | POA: Insufficient documentation

## 2015-10-23 DIAGNOSIS — I10 Essential (primary) hypertension: Secondary | ICD-10-CM | POA: Diagnosis not present

## 2015-10-23 DIAGNOSIS — J321 Chronic frontal sinusitis: Secondary | ICD-10-CM | POA: Diagnosis not present

## 2015-10-23 DIAGNOSIS — I480 Paroxysmal atrial fibrillation: Secondary | ICD-10-CM | POA: Diagnosis not present

## 2015-10-23 MED ORDER — ACETAMINOPHEN-CODEINE #3 300-30 MG PO TABS: 1.0000 | ORAL_TABLET | Freq: Three times a day (TID) | ORAL | Status: DC | PRN

## 2015-10-23 MED ORDER — AMOXICILLIN 875 MG PO TABS: 875.0000 mg | ORAL_TABLET | Freq: Two times a day (BID) | ORAL | Status: DC

## 2015-10-23 MED ORDER — FLUTICASONE PROPIONATE 50 MCG/ACT NA SUSP: 2.0000 | Freq: Every day | NASAL | Status: DC

## 2015-10-23 NOTE — Telephone Encounter (Signed)
Want to discuss my bp -- Was seen PC today and bp 135/90

## 2015-10-23 NOTE — Progress Notes (Signed)
Patient ID: Jason Stokes, male   DOB: 06-19-1961, 55 y.o.   MRN: [id number]   Jason Stokes, is a 55 y.o. male  [id number]  [id number]  DOB - [birth year]  Chief Complaint  Patient presents with  . Sinus Problem        Subjective:  Chief Complaint and HPI: Jason Stokes is a 55 y.o. male here today for 3-4 week history of nasal congestion and progressive sinus pain and drainage.  Pain in sinuses developed about 1 week ago and has worsened.  Blowing green discharge from his sinuses.  OTC not helping. No f/c. He was seen at the cardiology office  Yesterday to f/up on Paroxysmal atrial fibrillation.  Ablation was attempted in March, but a clot was found in his L atrium-it was decided yesterday that ablation will be attempted again in May 2017. He denies any palpitations, CP, dizziness, SOB today.  ROS:   Constitutional:  No f/c, No night sweats, No unexplained weight loss. EENT:  No vision changes, No blurry vision, No hearing changes. No mouth, throat, +ear and sinus congestion.  Respiratory: No cough, No SOB Cardiac: No CP, no palpitations GI:  No abd pain, No N/V/D. GU: No Urinary s/sx Musculoskeletal: No joint pain Neuro: No headache, no dizziness, no motor weakness.  Skin: No rash Endocrine:  No polydipsia. No polyuria.  Psych: Denies SI/HI   ALLERGIES: Allergies  Allergen Reactions  . Lisinopril Anaphylaxis    Swelling of lips and tongue.    PAST MEDICAL HISTORY: Past Medical History  Diagnosis Date  . Chronic lower back pain   . Hypertension   . Depression   . Small vessel disease ([place])     Right basal ganglia stroke  . History of alcohol abuse   . Sciatica neuralgia   . PAF (paroxysmal atrial fibrillation) ([place]) 03/27/2015    a. Myoview neg for ischemia >> Flecainide started 10/16 >> FU ETT   . History of nuclear stress test     Myoview 10/16: EF 50%, diaphragmatic attenuation, no ischemia, low risk  . GERD (gastroesophageal reflux disease)   . Migraine  2012-2014  . Stroke [facility]) "between 2012-2014"    residual "AF" (09/22/2015)  . Anxiety   . Bipolar disorder ([place])   . Schizophrenia ([place])     MEDICATIONS AT HOME: Prior to Admission medications   Medication Sig Start Date End Date Taking? Authorizing Provider  ABILIFY 15 MG tablet Take 15 mg by mouth daily. 04/30/14  Yes Historical Provider, MD  acetaminophen-codeine (TYLENOL #3) 300-30 MG tablet Take 1-2 tablets by mouth every 8 (eight) hours as needed for moderate pain. 10/23/15  Yes Argentina Donovan, PA-C  amiodarone (PACERONE) 200 MG tablet Take 1 tablet (200 mg total) by mouth 2 (two) times daily. Take 200mg  (1 tablet) by mouth twice daily for one week, then decrease to 200mg  (1 tablet) once daily 09/23/15  Yes Renee Dyane Dustman, PA-C  apixaban (ELIQUIS) 5 MG TABS tablet Take 1 tablet (5 mg total) by mouth 2 (two) times daily. 06/19/15  Yes Sherran Needs, NP  atenolol (TENORMIN) 50 MG tablet Take 1 tablet (50 mg total) by mouth 2 (two) times daily. 09/03/15  Yes Sherran Needs, NP  carbamazepine (TEGRETOL) 200 MG tablet Take 200 mg by mouth 2 (two) times daily. 07/05/13  Yes Kathrynn Ducking, MD  CYMBALTA 60 MG capsule Take 60 mg by mouth daily. 04/29/14  Yes Historical Provider, MD  diltiazem (CARDIZEM CD) 180 MG 24 hr capsule  Take 1 capsule (180 mg total) by mouth daily. 04/14/15  Yes Sueanne Margarita, MD  fluticasone (FLONASE) 50 MCG/ACT nasal spray Place 2 sprays into both nostrils daily. 10/23/15  Yes Argentina Donovan, PA-C  hydrALAZINE (APRESOLINE) 25 MG tablet Take 1 tablet (25 mg total) by mouth 3 (three) times daily. 05/27/15  Yes [name] Joylene Draft, PA-C  levETIRAcetam (KEPPRA) 250 MG tablet Take 500 mg by mouth 2 (two) times daily.   Yes Historical Provider, MD  omeprazole (PRILOSEC) 20 MG capsule Take 1 capsule (20 mg total) by mouth daily. 02/25/15  Yes Tresa Garter, MD  triamcinolone ointment (KENALOG) 0.5 % Apply 1 application topically 2 (two) times daily. 07/10/15  Yes Josalyn  Funches, MD  trimethoprim-polymyxin b (POLYTRIM) ophthalmic solution Place 1 drop into the right eye every 4 (four) hours. 07/10/15  Yes Josalyn Funches, MD  amoxicillin (AMOXIL) 875 MG tablet Take 1 tablet (875 mg total) by mouth 2 (two) times daily. 10/23/15   Argentina Donovan, PA-C     Objective:  EXAM:   Filed Vitals:   10/23/15 1021  BP: 135/90  Pulse: 77  Temp: 97.7 F (36.5 C)  TempSrc: Oral  Resp: 18  Height: 6\' 1"  (1.854 m)  Weight: 278 lb 3.2 oz (126.191 kg)  SpO2: 99%    General appearance : A&OX3. NAD. Non-toxic-appearing HEENT: Atraumatic and Normocephalic.  PERRLA. EOM intact.  TM dull B. + turbinates inflamed and swollen.  Mouth-MMM, post pharynx WNL w/o erythema, + PND.  TTP over maxillary and frontal areas.  Neck: supple, no JVD. No cervical lymphadenopathy. No thyromegaly Chest/Lungs:  Breathing-non-labored, Good air entry bilaterally, breath sounds normal without rales, rhonchi, or wheezing  CVS: S1 S2 regular, no murmurs, gallops, rubs . Extremities: B/L Lower Ext shows no edema, both legs are warm to touch with = pulse throughout Neurology:  CN II-XII grossly intact, Non focal.   Psych:  TP linear. J/I WNL. Normal speech. Appropriate eye contact and affect.  Skin:  No Rash  Data Review Lab Results  Component Value Date   HGBA1C 5.60 07/10/2015     Assessment & Plan   1. Chronic frontal sinusitis  fluticasone (FLONASE) 50 MCG/ACT nasal spray; Place 2 sprays into both nostrils daily.  Dispense: 16 g; Refill: 0 - amoxicillin (AMOXIL) 875 MG tablet; Take 1 tablet (875 mg total) by mouth 2 (two) times daily.  Dispense: 20 tablet; Refill: 0  2. Chronic pain of right knee - acetaminophen-codeine (TYLENOL #3) 300-30 MG tablet; Take 1-2 tablets by mouth every 8 (eight) hours as needed for moderate pain.  Dispense: 60 tablet; Refill: 0  3. Chronic low back pain - acetaminophen-codeine (TYLENOL #3) 300-30 MG tablet; Take 1-2 tablets by mouth every 8 (eight)  hours as needed for moderate pain.  Dispense: 60 tablet; Refill: 0  4. Essential hypertension Reading today much better than at cardiologist yesterday!  Continue with medications and call cardiologist with BP readings as he was directed yesterday.   5. PAF (paroxysmal atrial fibrillation) (HCC) Due to a blood clot in his L atrium previously, ablation was aborted.  They will be attempting this procedure again in May.  See yesterday's note that was reviewed by me today.  Deferred labs were ordered yesterday.   Patient have been counseled extensively about nutrition and exercise  Return in about 7 weeks (around 12/11/2015) for check up with Dr Adrian Blackwater.  The patient was given clear instructions to go to ER or return to medical center  if symptoms don't improve, worsen or new problems develop. The patient verbalized understanding. The patient was told to call to get lab results if they haven't heard anything in the next week.    Freeman Caldron, PA-C [facility], [place]   10/23/2015, 10:40 AM

## 2015-10-23 NOTE — Progress Notes (Signed)
Patient is here for Sinus Concerns  Patient complains of sinus pressure since the season changed. Patient used OTC medications which preovided no relief. Patient complains of chronic lower back pain currently scaled at a 9.   Patient took his medication this morning and has not eaten.  Patient request refills on Tylenol 3 and Nasal Spray.

## 2015-10-23 NOTE — Telephone Encounter (Signed)
Calling to let us know that he saw his PCP today and BP was 135/90.  Advised that was better than when seen yesterday.  Advised to take his BP daily and record.  He states he does not have a BP cuff so recommended that he get one or go to a fire department or [place].  Advised to take BP about 1 hr after taking his medications, record and call to office in 2 wks per Dr. Curt Bears request.  Also advised to call if he experiences SOB, dizziness, "passing out spells".  He verbalizes understanding and will call in 2 wks.

## 2015-10-23 NOTE — Patient Instructions (Signed)

## 2015-10-28 NOTE — Addendum Note (Signed)
Addended by: Freada Bergeron on: 10/28/2015 08:44 AM   Modules accepted: Orders

## 2015-11-30 ENCOUNTER — Other Ambulatory Visit (INDEPENDENT_AMBULATORY_CARE_PROVIDER_SITE_OTHER): Payer: Medicaid Other | Admitting: *Deleted

## 2015-11-30 DIAGNOSIS — I480 Paroxysmal atrial fibrillation: Secondary | ICD-10-CM | POA: Diagnosis not present

## 2015-11-30 DIAGNOSIS — Z01812 Encounter for preprocedural laboratory examination: Secondary | ICD-10-CM

## 2015-11-30 LAB — COMPREHENSIVE METABOLIC PANEL
ALT: 20 U/L (ref 9–46)
AST: 17 U/L (ref 10–35)
Albumin: 4.1 g/dL (ref 3.6–5.1)
Alkaline Phosphatase: 66 U/L (ref 40–115)
BUN: 12 mg/dL (ref 7–25)
CO2: 24 mmol/L (ref 20–31)
Calcium: 9.4 mg/dL (ref 8.6–10.3)
Chloride: 104 mmol/L (ref 98–110)
Creat: 1.48 mg/dL — ABNORMAL HIGH (ref 0.70–1.33)
Glucose, Bld: 114 mg/dL — ABNORMAL HIGH (ref 65–99)
Potassium: 3.9 mmol/L (ref 3.5–5.3)
Sodium: 137 mmol/L (ref 135–146)
Total Bilirubin: 0.6 mg/dL (ref 0.2–1.2)
Total Protein: 7.2 g/dL (ref 6.1–8.1)

## 2015-11-30 LAB — CBC WITH DIFFERENTIAL/PLATELET
Basophils Absolute: 0 cells/uL (ref 0–200)
Basophils Relative: 0 %
Eosinophils Absolute: 174 cells/uL (ref 15–500)
Eosinophils Relative: 3 %
HCT: 42.7 % (ref 38.5–50.0)
Hemoglobin: 13.7 g/dL (ref 13.2–17.1)
Lymphocytes Relative: 32 %
Lymphs Abs: 1856 cells/uL (ref 850–3900)
MCH: 25.2 pg — ABNORMAL LOW (ref 27.0–33.0)
MCHC: 32.1 g/dL (ref 32.0–36.0)
MCV: 78.5 fL — ABNORMAL LOW (ref 80.0–100.0)
MPV: 10.4 fL (ref 7.5–12.5)
Monocytes Absolute: 464 cells/uL (ref 200–950)
Monocytes Relative: 8 %
Neutro Abs: 3306 cells/uL (ref 1500–7800)
Neutrophils Relative %: 57 %
Platelets: 206 10*3/uL (ref 140–400)
RBC: 5.44 MIL/uL (ref 4.20–5.80)
RDW: 17 % — ABNORMAL HIGH (ref 11.0–15.0)
WBC: 5.8 10*3/uL (ref 3.8–10.8)

## 2015-11-30 LAB — TSH: TSH: 1.38 mIU/L (ref 0.40–4.50)

## 2015-11-30 NOTE — Addendum Note (Signed)
Addended by: Eulis Foster on: 11/30/2015 12:14 PM   Modules accepted: Orders

## 2015-12-03 ENCOUNTER — Ambulatory Visit (HOSPITAL_COMMUNITY)
Admission: RE | Admit: 2015-12-03 | Discharge: 2015-12-03 | Disposition: A | Discharge status: Home / Self Care | Payer: Medicaid Other | Source: Ambulatory Visit | Attending: Cardiology | Admitting: Cardiology

## 2015-12-03 ENCOUNTER — Encounter (HOSPITAL_COMMUNITY): Payer: Self-pay

## 2015-12-03 DIAGNOSIS — J984 Other disorders of lung: Secondary | ICD-10-CM | POA: Diagnosis not present

## 2015-12-03 DIAGNOSIS — J8410 Pulmonary fibrosis, unspecified: Secondary | ICD-10-CM | POA: Insufficient documentation

## 2015-12-03 DIAGNOSIS — K760 Fatty (change of) liver, not elsewhere classified: Secondary | ICD-10-CM | POA: Insufficient documentation

## 2015-12-03 DIAGNOSIS — I480 Paroxysmal atrial fibrillation: Secondary | ICD-10-CM | POA: Diagnosis not present

## 2015-12-03 MED ORDER — IOPAMIDOL (ISOVUE-370) INJECTION 76%
INTRAVENOUS | Status: AC
  Administered 2015-12-03: 80 mL
  Filled 2015-12-03: qty 100

## 2015-12-10 ENCOUNTER — Encounter (HOSPITAL_COMMUNITY)
Admission: RE | Disposition: A | Discharge status: Home / Self Care | Payer: Self-pay | Source: Ambulatory Visit | Attending: Cardiology

## 2015-12-10 ENCOUNTER — Ambulatory Visit (HOSPITAL_COMMUNITY): Payer: Medicaid Other | Admitting: Anesthesiology

## 2015-12-10 ENCOUNTER — Ambulatory Visit (HOSPITAL_COMMUNITY)
Admission: RE | Admit: 2015-12-10 | Discharge: 2015-12-11 | Disposition: A | Discharge status: Home / Self Care | Payer: Medicaid Other | Source: Ambulatory Visit | Attending: Cardiology | Admitting: Cardiology

## 2015-12-10 ENCOUNTER — Encounter (HOSPITAL_COMMUNITY): Payer: Self-pay | Admitting: Anesthesiology

## 2015-12-10 DIAGNOSIS — F329 Major depressive disorder, single episode, unspecified: Secondary | ICD-10-CM | POA: Insufficient documentation

## 2015-12-10 DIAGNOSIS — I10 Essential (primary) hypertension: Secondary | ICD-10-CM | POA: Diagnosis not present

## 2015-12-10 DIAGNOSIS — Z7901 Long term (current) use of anticoagulants: Secondary | ICD-10-CM | POA: Diagnosis not present

## 2015-12-10 DIAGNOSIS — I480 Paroxysmal atrial fibrillation: Secondary | ICD-10-CM | POA: Diagnosis not present

## 2015-12-10 DIAGNOSIS — F319 Bipolar disorder, unspecified: Secondary | ICD-10-CM | POA: Insufficient documentation

## 2015-12-10 DIAGNOSIS — Z8673 Personal history of transient ischemic attack (TIA), and cerebral infarction without residual deficits: Secondary | ICD-10-CM | POA: Diagnosis not present

## 2015-12-10 DIAGNOSIS — F419 Anxiety disorder, unspecified: Secondary | ICD-10-CM | POA: Insufficient documentation

## 2015-12-10 DIAGNOSIS — I4891 Unspecified atrial fibrillation: Secondary | ICD-10-CM | POA: Diagnosis present

## 2015-12-10 DIAGNOSIS — Z87891 Personal history of nicotine dependence: Secondary | ICD-10-CM | POA: Diagnosis not present

## 2015-12-10 HISTORY — PX: ELECTROPHYSIOLOGIC STUDY: SHX172A

## 2015-12-10 LAB — POCT ACTIVATED CLOTTING TIME
Activated Clotting Time: 281 seconds
Activated Clotting Time: 291 seconds
Activated Clotting Time: 291 seconds
Activated Clotting Time: 332 seconds
Activated Clotting Time: 291 seconds
Activated Clotting Time: 193 seconds
Activated Clotting Time: 183 seconds

## 2015-12-10 LAB — MRSA PCR SCREENING: MRSA by PCR: NEGATIVE

## 2015-12-10 SURGERY — ATRIAL FIBRILLATION ABLATION
Anesthesia: General

## 2015-12-10 MED ORDER — BUPIVACAINE HCL (PF) 0.25 % IJ SOLN
INTRAMUSCULAR | Status: AC
  Filled 2015-12-10: qty 30

## 2015-12-10 MED ORDER — SODIUM CHLORIDE 0.9 % IV SOLN
INTRAVENOUS | Status: DC | PRN
  Administered 2015-12-10: 08:00:00 via INTRAVENOUS

## 2015-12-10 MED ORDER — DOBUTAMINE IN D5W 4-5 MG/ML-% IV SOLN
INTRAVENOUS | Status: AC
  Filled 2015-12-10: qty 250

## 2015-12-10 MED ORDER — LEVETIRACETAM 500 MG PO TABS
500.0000 mg | ORAL_TABLET | Freq: Two times a day (BID) | ORAL | Status: DC
  Administered 2015-12-10 – 2015-12-11 (×2): 500 mg via ORAL
  Filled 2015-12-10 (×2): qty 1

## 2015-12-10 MED ORDER — ACETAMINOPHEN 325 MG PO TABS: 650.0000 mg | ORAL_TABLET | ORAL | Status: DC | PRN

## 2015-12-10 MED ORDER — HEPARIN (PORCINE) IN NACL 2-0.9 UNIT/ML-% IJ SOLN
INTRAMUSCULAR | Status: AC
  Filled 2015-12-10: qty 500

## 2015-12-10 MED ORDER — APIXABAN 5 MG PO TABS
5.0000 mg | ORAL_TABLET | Freq: Two times a day (BID) | ORAL | Status: DC
  Administered 2015-12-10 – 2015-12-11 (×2): 5 mg via ORAL
  Filled 2015-12-10 (×2): qty 1

## 2015-12-10 MED ORDER — NEOSTIGMINE METHYLSULFATE 10 MG/10ML IV SOLN
INTRAVENOUS | Status: DC | PRN
  Administered 2015-12-10: 2 mg via INTRAVENOUS

## 2015-12-10 MED ORDER — BUPIVACAINE HCL (PF) 0.25 % IJ SOLN
INTRAMUSCULAR | Status: DC | PRN
  Administered 2015-12-10: 20 mL

## 2015-12-10 MED ORDER — DILTIAZEM HCL ER COATED BEADS 180 MG PO CP24
180.0000 mg | ORAL_CAPSULE | Freq: Every day | ORAL | Status: DC
  Administered 2015-12-11: 180 mg via ORAL
  Filled 2015-12-10: qty 1

## 2015-12-10 MED ORDER — ARTIFICIAL TEARS OP OINT
TOPICAL_OINTMENT | OPHTHALMIC | Status: DC | PRN
  Administered 2015-12-10: 1 via OPHTHALMIC

## 2015-12-10 MED ORDER — AMIODARONE HCL 200 MG PO TABS
200.0000 mg | ORAL_TABLET | Freq: Every day | ORAL | Status: DC
  Administered 2015-12-11: 200 mg via ORAL
  Filled 2015-12-10: qty 1

## 2015-12-10 MED ORDER — SODIUM CHLORIDE 0.9% FLUSH: 3.0000 mL | INTRAVENOUS | Status: DC | PRN

## 2015-12-10 MED ORDER — SUCCINYLCHOLINE CHLORIDE 20 MG/ML IJ SOLN
INTRAMUSCULAR | Status: DC | PRN
  Administered 2015-12-10: 120 mg via INTRAVENOUS

## 2015-12-10 MED ORDER — LIDOCAINE HCL (CARDIAC) 20 MG/ML IV SOLN
INTRAVENOUS | Status: DC | PRN
  Administered 2015-12-10: 60 mg via INTRAVENOUS

## 2015-12-10 MED ORDER — PROPOFOL 10 MG/ML IV BOLUS
INTRAVENOUS | Status: DC | PRN
  Administered 2015-12-10: 30 mg via INTRAVENOUS
  Administered 2015-12-10: 170 mg via INTRAVENOUS

## 2015-12-10 MED ORDER — PHENYLEPHRINE HCL 10 MG/ML IJ SOLN
10.0000 mg | INTRAVENOUS | Status: DC | PRN
  Administered 2015-12-10: 25 ug/min via INTRAVENOUS

## 2015-12-10 MED ORDER — ROCURONIUM BROMIDE 100 MG/10ML IV SOLN
INTRAVENOUS | Status: DC | PRN
  Administered 2015-12-10: 30 mg via INTRAVENOUS
  Administered 2015-12-10: 10 mg via INTRAVENOUS

## 2015-12-10 MED ORDER — TRAZODONE HCL 50 MG PO TABS
100.0000 mg | ORAL_TABLET | Freq: Every day | ORAL | Status: DC
  Administered 2015-12-10: 100 mg via ORAL
  Filled 2015-12-10: qty 2

## 2015-12-10 MED ORDER — POLYMYXIN B-TRIMETHOPRIM 10000-0.1 UNIT/ML-% OP SOLN
1.0000 [drp] | OPHTHALMIC | Status: DC
  Administered 2015-12-10 – 2015-12-11 (×4): 1 [drp] via OPHTHALMIC
  Filled 2015-12-10: qty 10

## 2015-12-10 MED ORDER — MIDAZOLAM HCL 5 MG/5ML IJ SOLN
INTRAMUSCULAR | Status: DC | PRN
  Administered 2015-12-10: 2 mg via INTRAVENOUS

## 2015-12-10 MED ORDER — HEPARIN (PORCINE) IN NACL 2-0.9 UNIT/ML-% IJ SOLN
INTRAMUSCULAR | Status: DC | PRN
  Administered 2015-12-10: 09:00:00

## 2015-12-10 MED ORDER — ONDANSETRON HCL 4 MG/2ML IJ SOLN: 4.0000 mg | Freq: Four times a day (QID) | INTRAMUSCULAR | Status: DC | PRN

## 2015-12-10 MED ORDER — GLYCOPYRROLATE 0.2 MG/ML IJ SOLN
INTRAMUSCULAR | Status: DC | PRN
  Administered 2015-12-10: 0.2 mg via INTRAVENOUS

## 2015-12-10 MED ORDER — DULOXETINE HCL 60 MG PO CPEP
60.0000 mg | ORAL_CAPSULE | Freq: Every day | ORAL | Status: DC
  Administered 2015-12-11: 60 mg via ORAL
  Filled 2015-12-10: qty 1

## 2015-12-10 MED ORDER — HEPARIN SODIUM (PORCINE) 1000 UNIT/ML IJ SOLN
INTRAMUSCULAR | Status: DC | PRN
  Administered 2015-12-10: 4000 [IU] via INTRAVENOUS
  Administered 2015-12-10: 3000 [IU] via INTRAVENOUS
  Administered 2015-12-10: 4000 [IU] via INTRAVENOUS
  Administered 2015-12-10: 15000 [IU] via INTRAVENOUS
  Administered 2015-12-10: 6000 [IU] via INTRAVENOUS

## 2015-12-10 MED ORDER — ONDANSETRON HCL 4 MG/2ML IJ SOLN
INTRAMUSCULAR | Status: DC | PRN
  Administered 2015-12-10: 4 mg via INTRAVENOUS

## 2015-12-10 MED ORDER — LACTATED RINGERS IV SOLN
INTRAVENOUS | Status: DC | PRN
  Administered 2015-12-10 (×2): via INTRAVENOUS

## 2015-12-10 MED ORDER — ARIPIPRAZOLE 15 MG PO TABS
15.0000 mg | ORAL_TABLET | Freq: Every day | ORAL | Status: DC
  Administered 2015-12-11: 15 mg via ORAL
  Filled 2015-12-10: qty 1

## 2015-12-10 MED ORDER — FLUTICASONE PROPIONATE 50 MCG/ACT NA SUSP
2.0000 | Freq: Every day | NASAL | Status: DC
Start: 2015-12-10 — End: 2015-12-11
  Administered 2015-12-10 – 2015-12-11 (×2): 2 via NASAL
  Filled 2015-12-10: qty 16

## 2015-12-10 MED ORDER — MENTHOL 3 MG MT LOZG
1.0000 | LOZENGE | OROMUCOSAL | Status: DC | PRN
  Administered 2015-12-10: 3 mg via ORAL
  Filled 2015-12-10: qty 9

## 2015-12-10 MED ORDER — ATENOLOL 50 MG PO TABS
50.0000 mg | ORAL_TABLET | Freq: Two times a day (BID) | ORAL | Status: DC
  Administered 2015-12-10 – 2015-12-11 (×2): 50 mg via ORAL
  Filled 2015-12-10 (×2): qty 1

## 2015-12-10 MED ORDER — DOBUTAMINE IN D5W 4-5 MG/ML-% IV SOLN
INTRAVENOUS | Status: DC | PRN
  Administered 2015-12-10: 20 ug/kg/min via INTRAVENOUS

## 2015-12-10 MED ORDER — HYDRALAZINE HCL 25 MG PO TABS
25.0000 mg | ORAL_TABLET | Freq: Three times a day (TID) | ORAL | Status: DC
  Administered 2015-12-10 – 2015-12-11 (×3): 25 mg via ORAL
  Filled 2015-12-10 (×3): qty 1

## 2015-12-10 MED ORDER — SODIUM CHLORIDE 0.9 % IV SOLN: 250.0000 mL | INTRAVENOUS | Status: DC | PRN

## 2015-12-10 MED ORDER — FENTANYL CITRATE (PF) 100 MCG/2ML IJ SOLN
INTRAMUSCULAR | Status: DC | PRN
  Administered 2015-12-10: 100 ug via INTRAVENOUS
  Administered 2015-12-10: 50 ug via INTRAVENOUS
  Administered 2015-12-10: 100 ug via INTRAVENOUS

## 2015-12-10 MED ORDER — CARBAMAZEPINE 200 MG PO TABS
200.0000 mg | ORAL_TABLET | Freq: Two times a day (BID) | ORAL | Status: DC
  Administered 2015-12-10 – 2015-12-11 (×2): 200 mg via ORAL
  Filled 2015-12-10 (×2): qty 1

## 2015-12-10 MED ORDER — SODIUM CHLORIDE 0.9% FLUSH
3.0000 mL | Freq: Two times a day (BID) | INTRAVENOUS | Status: DC
  Administered 2015-12-10 (×2): 3 mL via INTRAVENOUS

## 2015-12-10 MED ORDER — HEPARIN SODIUM (PORCINE) 1000 UNIT/ML IJ SOLN
INTRAMUSCULAR | Status: DC | PRN
  Administered 2015-12-10 (×2): 1000 [IU] via INTRAVENOUS

## 2015-12-10 MED ORDER — HEPARIN SODIUM (PORCINE) 1000 UNIT/ML IJ SOLN
INTRAMUSCULAR | Status: AC
  Filled 2015-12-10: qty 1

## 2015-12-10 MED ORDER — PROTAMINE SULFATE 10 MG/ML IV SOLN
INTRAVENOUS | Status: DC | PRN
  Administered 2015-12-10 (×4): 10 mg via INTRAVENOUS

## 2015-12-10 MED ORDER — ACETAMINOPHEN-CODEINE #3 300-30 MG PO TABS: 1.0000 | ORAL_TABLET | Freq: Three times a day (TID) | ORAL | Status: DC | PRN

## 2015-12-10 MED ORDER — PANTOPRAZOLE SODIUM 40 MG PO TBEC
80.0000 mg | DELAYED_RELEASE_TABLET | Freq: Every day | ORAL | Status: DC
  Administered 2015-12-11: 80 mg via ORAL
  Filled 2015-12-10: qty 2

## 2015-12-10 MED ORDER — FENTANYL CITRATE (PF) 100 MCG/2ML IJ SOLN: 25.0000 ug | INTRAMUSCULAR | Status: DC | PRN

## 2015-12-10 SURGICAL SUPPLY — 22 items
BAG SNAP BAND KOVER 36X36 (MISCELLANEOUS) ×3 IMPLANT
BLANKET WARM UNDERBOD FULL ACC (MISCELLANEOUS) ×3 IMPLANT
CATH NAVISTAR SMARTTOUCH DF (ABLATOR) ×2 IMPLANT
CATH SOUNDSTAR 3D IMAGING (CATHETERS) ×2 IMPLANT
CATH VARIABLE LASSO NAV 2515 (CATHETERS) ×3 IMPLANT
CATH WEBSTER BI DIR CS D-F CRV (CATHETERS) ×2 IMPLANT
COVER SWIFTLINK CONNECTOR (BAG) ×3 IMPLANT
NEEDLE TRANSSEPTAL BRK 98CM (NEEDLE) ×3 IMPLANT
NEEDLE TRANSSEPTAL BRK XS 98CM (SHEATH) ×3 IMPLANT
PACK EP LATEX FREE (CUSTOM PROCEDURE TRAY) ×3
PACK EP LF (CUSTOM PROCEDURE TRAY) ×1 IMPLANT
PAD DEFIB LIFELINK (PAD) ×3 IMPLANT
PATCH CARTO3 (PAD) ×3 IMPLANT
SHEATH AGILIS NXT 8.5F 71CM (SHEATH) ×6 IMPLANT
SHEATH AVANTI 11F 11CM (SHEATH) ×3 IMPLANT
SHEATH PINNACLE 6F 10CM (SHEATH) IMPLANT
SHEATH PINNACLE 7F 10CM (SHEATH) ×3 IMPLANT
SHEATH PINNACLE 8F 10CM (SHEATH) ×6 IMPLANT
SHEATH PINNACLE 9F 10CM (SHEATH) ×4 IMPLANT
SHIELD RADPAD SCOOP 12X17 (MISCELLANEOUS) ×3 IMPLANT
TUBING SMART ABLATE COOLFLOW (TUBING) ×2 IMPLANT
WIRE ROSEN-J .035X260CM (WIRE) ×3 IMPLANT

## 2015-12-10 NOTE — Transfer of Care (Signed)
Immediate Anesthesia Transfer of Care Note  [name]  Procedure(s) Performed: Procedure(s): Atrial Fibrillation Ablation (N/A)  Patient Location: Cath Lab  Anesthesia Type:General  Level of Consciousness: awake, alert  and patient cooperative  Airway & Oxygen Therapy: Patient Spontanous Breathing and Patient connected to nasal cannula oxygen  Post-op Assessment: Report given to RN, Post -op Vital signs reviewed and stable and Patient moving all extremities X 4  Post vital signs: Reviewed and stable  Last Vitals:  Filed Vitals:   12/10/15 0547  BP: 131/92  Pulse: 56  Temp: 36.5 C  Resp: 20    Last Pain:  Filed Vitals:   12/10/15 1250  PainSc: 8       Patients Stated Pain Goal: 3 ([id number])  Complications: No apparent anesthesia complications

## 2015-12-10 NOTE — Discharge Summary (Signed)
ELECTROPHYSIOLOGY PROCEDURE DISCHARGE SUMMARY    Patient ID: Jason Stokes,  MRN: [id number], DOB/AGE: 12-19-1960 55 y.o.  Admit date: 12/10/2015 Discharge date: 12/11/15  Primary Care Physician: [name], MD Primary Cardiologist: Dr. Radford Pax Electrophysiologist: Dr. Curt Bears  Primary Discharge Diagnosis:  1. Paroxysmal AFib     CHA2DS2Vasc is at least 3 on Eliquis  Secondary Discharge Diagnosis:  1. HTN   Procedures This Admission:  1.  Electrophysiology study and radiofrequency catheter ablation on 12/10/15 by Dr Curt Bears.  This study demonstrated:  CONCLUSIONS: 1. Sinus rhythm upon presentation.  2. Successful electrical isolation and anatomical encircling of all four pulmonary veins with radiofrequency current. 3. No inducible arrhythmias following ablation both on and off of Isuprel 4. No early apparent complications.  12/11/15: DCCV, Dr. Curt Bears CONCLUSIONS:  1. Atrial fibrillation upon presentation.  2. Successful cardioversion  3. No early apparent complications.   Brief HPI: Jason Stokes is a 55 y.o. male with a history of paroxysmal atrial fibrillation.  They have failed medical therapy with amiodarone. Risks, benefits, and alternatives to catheter ablation of atrial fibrillation were reviewed with the patient who wished to proceed.  The patient underwent cardiac CT prior to the procedure which demonstrated normal no LAA thrombus.    Hospital Course:  The patient was admitted and underwent EPS/RFCA of atrial fibrillation with details as outlined above.  They were monitored on telemetry overnight which demonstrated he had returned to AF, he was brought to the cath lab and underwent DCCV with Dr. Curt Bears, restoring SR.  He was monitored post sedation and on telemetry unfortunately returned to AF with CVR about an hour afterwards, his have remained stable and he is without symptoms.  Groins b/l were without complication on the day of discharge.  The  patient was examined by Dr. Curt Bears and considered to be stable for discharge.  Wound care and restrictions were reviewed with the patient.  We will resume his prior dose of amiodarone 200mg  BID and early f/u with Roderic Palau, NP has been arranged, and Dr Curt Bears in 12 weeks for post ablation follow up. The patient requests pain medication for his back pain that has been exacerbated by his bed rest here.  He reports that he does not have the Tylenol #3 or any refills listed on his home medicine list and this did not work for him anyway.  D/w Dr. Curt Bears, will give him 3 days of Oxycodone and the patient is instructed to see his PMD for his back pain management going forward.    Physical Exam: Filed Vitals:   12/11/15 1000 12/11/15 1030 12/11/15 1100 12/11/15 1200  BP: 118/102 114/90 127/100 134/98  Pulse: 71 58 82 63  Temp:   98.1 F (36.7 C)   TempSrc:   Oral   Resp: 12 14 24 12   Height:      Weight:      SpO2: 94% 100% 98% 91%    GEN- The patient is well appearing, alert and oriented x 3 today.   HEENT: normocephalic, atraumatic; sclera clear, conjunctiva pink; hearing intact; oropharynx clear; neck supple  Lungs- Clear to ausculation bilaterally, normal work of breathing.  No wheezes, rales, rhonchi Heart- Irregular rate and rhythm, no murmurs, rubs or gallops  GI- soft, non-tender, non-distended Extremities- no clubbing, cyanosis, or edema; DP/PT/radial pulses 2+ bilaterally, groin b/l without hematoma/bruit MS- no significant deformity or atrophy Skin- warm and dry, no rash or lesion Psych- euthymic mood, full affect Neuro- strength and  sensation are intact   Labs:   Lab Results  Component Value Date   WBC 5.8 11/30/2015   HGB 13.7 11/30/2015   HCT 42.7 11/30/2015   MCV 78.5* 11/30/2015   PLT 206 11/30/2015     Discharge Medications:    Medication List    STOP taking these medications        acetaminophen-codeine 300-30 MG tablet  Commonly known as:  TYLENOL #3        TAKE these medications        ABILIFY 15 MG tablet  Generic drug:  ARIPiprazole  Take 15 mg by mouth daily.     amiodarone 200 MG tablet  Commonly known as:  PACERONE  Take 1 tablet (200 mg total) by mouth 2 (two) times daily.     apixaban 5 MG Tabs tablet  Commonly known as:  ELIQUIS  Take 1 tablet (5 mg total) by mouth 2 (two) times daily.     atenolol 50 MG tablet  Commonly known as:  TENORMIN  Take 1 tablet (50 mg total) by mouth 2 (two) times daily.     carbamazepine 200 MG tablet  Commonly known as:  TEGRETOL  Take 200 mg by mouth 2 (two) times daily.     CYMBALTA 60 MG capsule  Generic drug:  DULoxetine  Take 60 mg by mouth daily.     diltiazem 180 MG 24 hr capsule  Commonly known as:  CARDIZEM CD  Take 1 capsule (180 mg total) by mouth daily.     fluticasone 50 MCG/ACT nasal spray  Commonly known as:  FLONASE  Place 2 sprays into both nostrils daily.     hydrALAZINE 25 MG tablet  Commonly known as:  APRESOLINE  Take 1 tablet (25 mg total) by mouth 3 (three) times daily.     levETIRAcetam 250 MG tablet  Commonly known as:  KEPPRA  Take 500 mg by mouth 2 (two) times daily.     omeprazole 20 MG capsule  Commonly known as:  PRILOSEC  Take 1 capsule (20 mg total) by mouth daily.     oxyCODONE 5 MG immediate release tablet  Commonly known as:  ROXICODONE  Take 1 tablet (5 mg total) by mouth every 6 (six) hours as needed for severe pain.     PAZEO 0.7 % Soln  Generic drug:  Olopatadine HCl  Place 1 drop into both eyes daily as needed.     traZODone 100 MG tablet  Commonly known as:  DESYREL  Take 100-200 mg by mouth at bedtime.     triamcinolone ointment 0.5 %  Commonly known as:  KENALOG  Apply 1 application topically 2 (two) times daily.     trimethoprim-polymyxin b ophthalmic solution  Commonly known as:  POLYTRIM  Place 1 drop into the right eye every 4 (four) hours.     VOLTAREN 1 % Gel  Generic drug:  diclofenac sodium  Apply 1  application topically 4 (four) times daily.        Disposition:  Home Discharge Instructions    Diet - low sodium heart healthy    Complete by:  As directed      Increase activity slowly    Complete by:  As directed           Follow-up Information    Follow up with [facility] On 12/24/2015.   Specialty:  Cardiology   Why:  1:30PM   Contact information:   [place]  [id number] [place] [phone]      Follow up with Will [name], MD On 03/14/2016.   Specialty:  Cardiology   Why:  10:30AM   Contact information:   [place] [address] [phone]       Duration of Discharge Encounter: Greater than 30 minutes including physician time.  Signed, Tommye Standard, PA-C 12/11/2015 1:17 PM    I have seen and examined this patient with Tommye Standard.  Agree with above, note added to reflect my findings.  On exam, irregular rhythm, no murmurs, lungs clear.  Went back into AF the morning after ablation.  Had cardioversion but back into AF post cardioversion.  Will have him continue amiodarone and follow up in clinic.    Will M. Camnitz MD 12/15/2015 7:01 AM

## 2015-12-10 NOTE — H&P (Signed)
History of Present Illness: Jason Stokes is a 55 y.o. male who presents today for electrophysiology evaluation. He is a history of paroxysmal atrial fibrillation, hypertension and remote CVA. He presented to the emergency room for/16 with atrial fibrillation and RVR. He was started on Eliquis L diltiazem at that time. He did not follow-up with cardiology until 03/27/15 when he complained of palpitations. At that time he was in sinus rhythm. Event monitor demonstrated recurrent episodes of atrial fibrillation with heart rates in the 130s. He is a stress test done which was low risk for ischemia. He was started on flecainide for rhythm control. He had follow-up in December in the [facility] clinic at that time he had stopped his flecainide. He was having symptoms such as diaphoresis palpitations and lightheadedness. He was previously scheduled for AF ablation.  During the procedure, a clot was noted in the LA and the procedure was aborted.  He presents today for follow up and ablation.  Currently, he denies symptoms of palpitations, chest pain, shortness of breath, orthopnea, PND, lower extremity edema, claudication, dizziness, presyncope, syncope, bleeding, or neurologic sequela. The patient is tolerating medications without difficulties and is otherwise without complaint today.    Past Medical History  Diagnosis Date  . Chronic lower back pain   . Hypertension   . Depression   . Small vessel disease ([place])     Right basal ganglia stroke  . History of alcohol abuse   . Sciatica neuralgia   . PAF (paroxysmal atrial fibrillation) ([place]) 03/27/2015    a. Myoview neg for ischemia >> Flecainide started 10/16 >> FU ETT   . History of nuclear stress test     Myoview 10/16: EF 50%, diaphragmatic attenuation, no ischemia, low risk  . GERD (gastroesophageal reflux disease)   . Migraine 2012-2014  . Stroke [facility]) "between 2012-2014"    residual "AF" (09/22/2015)   . Anxiety   . Bipolar disorder ([place])   . Schizophrenia [facility])    Past Surgical History  Procedure Laterality Date  . Intercostal nerve block  2005  . Cyst excision  1996-97    surgery back of head   . Atrial fibrillation ablation  09/22/2015  . Knee arthroscopy Right 2016  . Ganglion cyst excision Left   . Electrophysiologic study N/A 09/22/2015    Procedure: Atrial Fibrillation Ablation; Surgeon: Samy Ryner [name], MD; Location: [facility]; Service: Cardiovascular; Laterality: N/A;     Current Outpatient Prescriptions  Medication Sig Dispense Refill  . ABILIFY 15 MG tablet Take 15 mg by mouth daily.  1  . acetaminophen-codeine (TYLENOL #3) 300-30 MG tablet Take 1-2 tablets by mouth every 8 (eight) hours as needed for moderate pain. 60 tablet 0  . amiodarone (PACERONE) 200 MG tablet Take 1 tablet (200 mg total) by mouth 2 (two) times daily. Take 200mg  (1 tablet) by mouth twice daily for one week, then decrease to 200mg  (1 tablet) once daily 60 tablet 3  . apixaban (ELIQUIS) 5 MG TABS tablet Take 1 tablet (5 mg total) by mouth 2 (two) times daily. 60 tablet 6  . atenolol (TENORMIN) 50 MG tablet Take 1 tablet (50 mg total) by mouth 2 (two) times daily. 60 tablet 3  . carbamazepine (TEGRETOL) 200 MG tablet Take 200 mg by mouth 2 (two) times daily.    . CYMBALTA 60 MG capsule Take 60 mg by mouth daily.  1  . diltiazem (CARDIZEM CD) 180 MG 24 hr capsule Take 1 capsule (180 mg  total) by mouth daily. 90 capsule 3  . fluticasone (FLONASE) 50 MCG/ACT nasal spray Place 2 sprays into both nostrils daily. (Patient taking differently: Place 2 sprays into both nostrils daily as needed for allergies. ) 16 g 0  . hydrALAZINE (APRESOLINE) 25 MG tablet Take 1 tablet (25 mg total) by mouth 3 (three) times daily. 90 tablet 11  . levETIRAcetam (KEPPRA) 250 MG tablet Take 500 mg by mouth 2 (two)  times daily.    Marland Kitchen omeprazole (PRILOSEC) 20 MG capsule Take 1 capsule (20 mg total) by mouth daily. 30 capsule 2  . triamcinolone ointment (KENALOG) 0.5 % Apply 1 application topically 2 (two) times daily. 30 g 0  . trimethoprim-polymyxin b (POLYTRIM) ophthalmic solution Place 1 drop into the right eye every 4 (four) hours. 10 mL 0   No current facility-administered medications for this visit.    Allergies: Lisinopril   Social History: The patient  reports that he quit smoking about 2 months ago. His smoking use included Cigarettes. He has a 29 pack-year smoking history. He has never used smokeless tobacco. He reports that he drinks alcohol. He reports that he uses illicit drugs (Marijuana).   Family History: The patient's family history includes Heart disease in his father; Schizophrenia in his sister.    ROS: Please see the history of present illness. Otherwise, review of systems is positive for chest pain, leg pain, back and muscle pain. All other systems are reviewed and negative.    PHYSICAL EXAM: VS: BP 148/104 mmHg  Pulse 58  Ht 6\' 1"  (1.854 m)  Wt 283 lb (128.368 kg)  BMI 37.35 kg/m2 , BMI Body mass index is 37.35 kg/(m^2). GEN: Well nourished, well developed, in no acute distress  HEENT: normal  Neck: no JVD, carotid bruits, or masses Cardiac: RRR; no murmurs, rubs, or gallops,no edema  Respiratory: clear to auscultation bilaterally, normal work of breathing GI: soft, nontender, nondistended, + BS MS: no deformity or atrophy  Skin: warm and dry Neuro: Strength and sensation are intact Psych: euthymic mood, full affect  EKG: EKG is ordered today. The ekg ordered today shows sinus rhythm, rate 58  Recent Labs: 05/04/2015: TSH 1.823 09/11/2015: BUN 12; Creat 1.33; Hemoglobin 14.3; Platelets 185; Potassium 3.9; Sodium 136    Lipid Panel   Labs (Brief)       Component Value Date/Time   CHOL 174 10/17/2013 1042   TRIG  180* 10/17/2013 1042   HDL 32* 10/17/2013 1042   CHOLHDL 5.4 10/17/2013 1042   VLDL 36 10/17/2013 1042   LDLCALC 106* 10/17/2013 1042       Wt Readings from Last 3 Encounters:  10/22/15 283 lb (128.368 kg)  09/23/15 276 lb 0.3 oz (125.2 kg)  09/03/15 273 lb 3.2 oz (123.923 kg)     Other studies Reviewed: Additional studies/ records that were reviewed today include:  SPECT 05/15/15, TTE 03/31/15 Review of the above records today demonstrates:  SPECT  The left ventricular ejection fraction is mildly decreased (45-54%).  Nuclear stress EF: 50%.  There was no ST segment deviation noted during stress.  Defect 1: Moderate sized, mild in intensity, fixed defect in the mid and basal inferior and basal inferolateral wall consistent with diaphragmatic attenuation. No ischemia noted.  This is a low risk study.  TTE - Normal LV systolic function; grade 1 diastolic dysfunction; mildLAE.  ASSESSMENT AND PLAN:  1. Paroxysmal atrial fibrillation: Has a chads 2 vasc score of 3 for hypertension and previous stroke. He is  on Eliquis for anticoagulation.He had an AF ablation attempt done on 3/7, but was noted to have a clot in his left atrium. The procedure was aborted at that time. At this point, he would like to have another attempt at ablation. I have discussed with him the risks and benefits of the procedure. Risks include bleeding, tamponade, heart block, stroke, and damage to surrounding organs. He understands these risks and wishes to proceed. He is tolerating his Eliquis well and we Jason Stokes continue that. Patient agrees to the procedure.  This patients CHA2DS2-VASc Score and unadjusted Ischemic Stroke Rate (% per year) is equal to 3.2 % stroke rate/year from a score of 3  Above score calculated as 1 point each if present [CHF, HTN, DM, Vascular=MI/PAD/Aortic Plaque, Age if 65-74, or Male] Above score calculated as 2 points each if present [Age > 75, or  Stroke/TIA/TE]  2. Hypertension: I have asked him to take his blood pressure home and call us back with the results in 2 weeks.        Lenaya Pietsch Curt Bears, MD 12/10/2015 7:06 AM

## 2015-12-10 NOTE — Anesthesia Preprocedure Evaluation (Addendum)
Anesthesia Evaluation  Patient identified by MRN, date of birth, ID band Patient awake    Reviewed: Allergy & Precautions, NPO status , Patient's Chart, lab work & pertinent test results  Airway Mallampati: II  TM Distance: >3 FB Neck ROM: Full    Dental no notable dental hx. (+) Teeth Intact, Dental Advisory Given   Pulmonary neg pulmonary ROS, former smoker,    Pulmonary exam normal breath sounds clear to auscultation       Cardiovascular hypertension, Pt. on medications Normal cardiovascular exam+ dysrhythmias Atrial Fibrillation  Rhythm:Regular Rate:Normal     Neuro/Psych Anxiety Depression Bipolar Disorder Schizophrenia CVA    GI/Hepatic negative GI ROS, Neg liver ROS,   Endo/Other  negative endocrine ROS  Renal/GU negative Renal ROS  negative genitourinary   Musculoskeletal negative musculoskeletal ROS (+)   Abdominal   Peds negative pediatric ROS (+)  Hematology negative hematology ROS (+)   Anesthesia Other Findings   Reproductive/Obstetrics negative OB ROS                            Anesthesia Physical Anesthesia Plan  ASA: III  Anesthesia Plan: General   Post-op Pain Management:    Induction: Intravenous  Airway Management Planned: Oral ETT  Additional Equipment:   Intra-op Plan:   Post-operative Plan: Extubation in OR  Informed Consent: I have reviewed the patients History and Physical, chart, labs and discussed the procedure including the risks, benefits and alternatives for the proposed anesthesia with the patient or authorized representative who has indicated his/her understanding and acceptance.   Dental advisory given  Plan Discussed with: CRNA and Surgeon  Anesthesia Plan Comments:         Anesthesia Quick Evaluation

## 2015-12-10 NOTE — Anesthesia Procedure Notes (Signed)
Procedure Name: Intubation Date/Time: 12/10/2015 8:01 AM Performed by: Suzy Bouchard Pre-anesthesia Checklist: Patient identified, Timeout performed, Emergency Drugs available, Suction available and Patient being monitored Patient Re-evaluated:Patient Re-evaluated prior to inductionOxygen Delivery Method: Circle system utilized Preoxygenation: Pre-oxygenation with 100% oxygen Intubation Type: IV induction Ventilation: Two handed mask ventilation required and Oral airway inserted - appropriate to patient size Laryngoscope Size: Sabra Heck and 2 Grade View: Grade II Tube type: Oral Tube size: 7.5 mm Number of attempts: 1 Airway Equipment and Method: Stylet Placement Confirmation: breath sounds checked- equal and bilateral,  positive ETCO2 and ETT inserted through vocal cords under direct vision Secured at: 23 cm Tube secured with: Tape Dental Injury: Teeth and Oropharynx as per pre-operative assessment  Comments: Recommend Miller 3 if using miller blade- Miller 2 not long enough to pick up epiglottis.

## 2015-12-10 NOTE — Progress Notes (Signed)
Site area: RFV x 2/LFV x 2 Site Prior to Removal:  Level 0/0  Pressure Applied For:30 min each Manual:   yes Patient Status During Pull:  stable Post Pull Site:  Level 0/0 Post Pull Instructions Given:  yes Post Pull Pulses Present:  Dressing Applied:  tegaderm Bedrest begins @ 1430 till 2030 Comments:Debbie and canderson

## 2015-12-11 ENCOUNTER — Encounter (HOSPITAL_COMMUNITY): Payer: Self-pay | Admitting: Cardiology

## 2015-12-11 ENCOUNTER — Encounter (HOSPITAL_COMMUNITY)
Admission: RE | Disposition: A | Discharge status: Home / Self Care | Payer: Self-pay | Source: Ambulatory Visit | Attending: Cardiology

## 2015-12-11 DIAGNOSIS — I480 Paroxysmal atrial fibrillation: Secondary | ICD-10-CM | POA: Diagnosis not present

## 2015-12-11 DIAGNOSIS — F319 Bipolar disorder, unspecified: Secondary | ICD-10-CM | POA: Diagnosis not present

## 2015-12-11 DIAGNOSIS — Z87891 Personal history of nicotine dependence: Secondary | ICD-10-CM | POA: Diagnosis not present

## 2015-12-11 DIAGNOSIS — I10 Essential (primary) hypertension: Secondary | ICD-10-CM | POA: Diagnosis not present

## 2015-12-11 HISTORY — PX: ELECTROPHYSIOLOGIC STUDY: SHX172A

## 2015-12-11 LAB — GLUCOSE, CAPILLARY: Glucose-Capillary: 122 mg/dL — ABNORMAL HIGH (ref 65–99)

## 2015-12-11 SURGERY — CARDIOVERSION (CATH LAB)

## 2015-12-11 MED ORDER — OXYCODONE HCL 5 MG PO TABS: 5.0000 mg | ORAL_TABLET | Freq: Four times a day (QID) | ORAL | Status: DC | PRN

## 2015-12-11 MED ORDER — SODIUM CHLORIDE 0.9 % IV SOLN: 250.0000 mL | INTRAVENOUS | Status: DC

## 2015-12-11 MED ORDER — SODIUM CHLORIDE 0.9% FLUSH
3.0000 mL | Freq: Two times a day (BID) | INTRAVENOUS | Status: DC
  Administered 2015-12-11: 3 mL via INTRAVENOUS

## 2015-12-11 MED ORDER — MIDAZOLAM HCL 2 MG/2ML IJ SOLN
INTRAMUSCULAR | Status: AC
  Filled 2015-12-11: qty 2

## 2015-12-11 MED ORDER — FENTANYL CITRATE (PF) 100 MCG/2ML IJ SOLN
INTRAMUSCULAR | Status: DC | PRN
  Administered 2015-12-11 (×2): 25 ug via INTRAVENOUS

## 2015-12-11 MED ORDER — SODIUM CHLORIDE 0.9% FLUSH: 3.0000 mL | INTRAVENOUS | Status: DC | PRN

## 2015-12-11 MED ORDER — OXYCODONE HCL 5 MG PO TABS
5.0000 mg | ORAL_TABLET | ORAL | Status: DC | PRN
  Administered 2015-12-11: 5 mg via ORAL
  Filled 2015-12-11: qty 1

## 2015-12-11 MED ORDER — MIDAZOLAM HCL 5 MG/5ML IJ SOLN
INTRAMUSCULAR | Status: DC | PRN
  Administered 2015-12-11: 1 mg via INTRAVENOUS
  Administered 2015-12-11: 2 mg via INTRAVENOUS
  Administered 2015-12-11 (×2): 1 mg via INTRAVENOUS

## 2015-12-11 MED ORDER — SODIUM CHLORIDE 0.9 % IV SOLN
INTRAVENOUS | Status: DC | PRN
  Administered 2015-12-11: 10 mL/h via INTRAVENOUS

## 2015-12-11 MED ORDER — AMIODARONE HCL 200 MG PO TABS: 200.0000 mg | ORAL_TABLET | Freq: Two times a day (BID) | ORAL | Status: DC

## 2015-12-11 MED ORDER — FENTANYL CITRATE (PF) 100 MCG/2ML IJ SOLN
INTRAMUSCULAR | Status: AC
  Filled 2015-12-11: qty 2

## 2015-12-11 MED ORDER — AMIODARONE HCL 200 MG PO TABS: 200.0000 mg | ORAL_TABLET | Freq: Every day | ORAL | Status: DC

## 2015-12-11 SURGICAL SUPPLY — 1 items: ELECT DEFIB PAD ADLT CADENCE (PAD) ×3 IMPLANT

## 2015-12-11 NOTE — Progress Notes (Signed)
Pt converted to Afib, HR 80's. VSS. EKG obtained for confirmation. Tommye Standard notified.

## 2015-12-11 NOTE — Anesthesia Postprocedure Evaluation (Signed)
Anesthesia Post Note  [name]  Procedure(s) Performed: Procedure(s) (LRB): Atrial Fibrillation Ablation (N/A)  Patient location during evaluation: PACU Anesthesia Type: General Level of consciousness: awake and alert Pain management: pain level controlled Vital Signs Assessment: post-procedure vital signs reviewed and stable Respiratory status: spontaneous breathing, nonlabored ventilation, respiratory function stable and patient connected to nasal cannula oxygen Cardiovascular status: blood pressure returned to baseline and stable Postop Assessment: no signs of nausea or vomiting Anesthetic complications: no    Last Vitals:  Filed Vitals:   12/11/15 1030 12/11/15 1100  BP: 114/90 127/100  Pulse: 58 82  Temp:  36.7 C  Resp: 14 24    Last Pain:  Filed Vitals:   12/11/15 1104  PainSc: 4                  Marigold Mom S

## 2015-12-11 NOTE — Progress Notes (Signed)
Pt S/P cardioversion. NSR without ectopy. Awakens to voice and is oriented to person, place and situation. Quickly back to sleep. O2 sat 93% on 2LNC. Will observe and return him to room when more awake. Dr Curt Bears in to speak to patient.

## 2015-12-11 NOTE — Progress Notes (Addendum)
SUBJECTIVE: The patient is doing well today.  At this time, he denies chest pain, shortness of breath, or any new concerns.  Went into AF today around 7 AM.  Had AF ablation done yesterday with successful isolation of pulmonary veins.  Marland Kitchen amiodarone  200 mg Oral Daily  . apixaban  5 mg Oral BID  . ARIPiprazole  15 mg Oral Daily  . atenolol  50 mg Oral BID  . carbamazepine  200 mg Oral BID  . diltiazem  180 mg Oral Daily  . DULoxetine  60 mg Oral Daily  . fluticasone  2 spray Each Nare Daily  . hydrALAZINE  25 mg Oral TID  . levETIRAcetam  500 mg Oral BID  . pantoprazole  80 mg Oral Daily  . sodium chloride flush  3 mL Intravenous Q12H  . sodium chloride flush  3 mL Intravenous Q12H  . traZODone  100 mg Oral QHS  . trimethoprim-polymyxin b  1 drop Right Eye Q4H while awake   . sodium chloride      OBJECTIVE: Physical Exam: Filed Vitals:   12/11/15 0400 12/11/15 0500 12/11/15 0600 12/11/15 0700  BP: 127/91     Pulse: 72 71 73 81  Temp:      TempSrc:      Resp: 13 13 12 15   Height:      Weight:      SpO2: 93% 94% 94% 94%    Intake/Output Summary (Last 24 hours) at 12/11/15 0724 Last data filed at 12/11/15 0343  Gross per 24 hour  Intake   2045 ml  Output   2750 ml  Net   -705 ml    Telemetry reveals sinus rhythm  GEN- The patient is well appearing, alert and oriented x 3 today.   Head- normocephalic, atraumatic Eyes-  Sclera clear, conjunctiva pink Ears- hearing intact Oropharynx- clear Neck- supple, no JVP Lymph- no cervical lymphadenopathy Lungs- Clear to ausculation bilaterally, normal work of breathing Heart- iregular rate and rhythm, no murmurs, rubs or gallops, PMI not laterally displaced GI- soft, NT, ND, + BS Extremities- no clubbing, cyanosis, or edema Skin- no rash or lesion Psych- euthymic mood, full affect Neuro- strength and sensation are intact  LABS: Basic Metabolic Panel: No results for input(s): NA, K, CL, CO2, GLUCOSE, BUN, CREATININE,  CALCIUM, MG, PHOS in the last 72 hours. Liver Function Tests: No results for input(s): AST, ALT, ALKPHOS, BILITOT, PROT, ALBUMIN in the last 72 hours. No results for input(s): LIPASE, AMYLASE in the last 72 hours. CBC: No results for input(s): WBC, NEUTROABS, HGB, HCT, MCV, PLT in the last 72 hours. Cardiac Enzymes: No results for input(s): CKTOTAL, CKMB, CKMBINDEX, TROPONINI in the last 72 hours. BNP: Invalid input(s): POCBNP D-Dimer: No results for input(s): DDIMER in the last 72 hours. Hemoglobin A1C: No results for input(s): HGBA1C in the last 72 hours. Fasting Lipid Panel: No results for input(s): CHOL, HDL, LDLCALC, TRIG, CHOLHDL, LDLDIRECT in the last 72 hours. Thyroid Function Tests: No results for input(s): TSH, T4TOTAL, T3FREE, THYROIDAB in the last 72 hours.  Invalid input(s): FREET3 Anemia Panel: No results for input(s): VITAMINB12, FOLATE, FERRITIN, TIBC, IRON, RETICCTPCT in the last 72 hours.  RADIOLOGY: Ct Heart Morp W/cta Cor W/score W/ca W/cm &/or Wo/cm  12/03/2015  ADDENDUM REPORT: 12/03/2015 13:53 CLINICAL DATA:  Pre Ablation EXAM: Cardiac CTA MEDICATIONS: Sub lingual nitro. 4mg  and lopressor mg TECHNIQUE: The patient was scanned on a [employer]ips [id number] slice scanner. Gantry rotation speed was 270 msecs. Collimation  was .14mm. A 100 kV prospective scan was triggered in the descending thoracic aorta at 111 HU's with 5% padding centered around 78% of the R-R interval. Average HR during the scan was bpm. The 3D data set was interpreted on a dedicated work station using MPR, MIP and VRT modes. A total of 80cc of contrast was used. FINDINGS: Non-cardiac: See separate report from [facility]. No significant findings on limited lung and soft tissue windows. Calcium Score:  14 mild calcium in RCA Moderate biatrial enlargement. Moderate RV enlargement. No ASD. No pericardial effusion. No LAA thrombus Esophagus runs behind LLPV. All 4 pulmonary veins drain normally into LA with no  anomaly. LUPV somewhat small And tortuous. LIPV:  Ostium 22 mm Area 3.6 cm2 LSPV:  Ostium 13 mm Area 1.5 cm2 RIPV:  Ostium 17 mm Area 3.2 cm2 RSPV:  Ostium 22 mm Area 3 cm2 IMPRESSION: 1) Normal pulmonary vein anatomy see measurements above 2) Moderate biatral enlargement 3) No LAA thrombus 4) No ASD 5) No pericardial effusion 6) Esophagus runs behind LLPV 7) Coronary calcium score 14 which is 56th percentile for age and sex matched controls Jenkins Rouge Electronically Signed   By: Jenkins Rouge M.D.   On: 12/03/2015 13:53  12/03/2015  EXAM: OVER-READ INTERPRETATION  CT CHEST The following report is an over-read performed by radiologist Dr. Rebekah Chesterfield [facility] on 12/03/2015. This over-read does not include interpretation of cardiac or coronary anatomy or pathology. The coronary calcium score/coronary CTA interpretation by the cardiologist is attached. COMPARISON:  09/21/2015. FINDINGS: Tiny calcified granuloma in the lateral segment of the right middle lobe incidentally noted. Within the visualized portions of the thorax there are no suspicious appearing pulmonary nodules or masses, there is no acute consolidative airspace disease, no pleural effusions, no pneumothorax and no lymphadenopathy. Thin-walled cyst incidentally noted in the left lower lobe. Visualized portions of the upper abdomen demonstrate diffuse low attenuation throughout the hepatic parenchyma, compatible with mild hepatic steatosis. There are no aggressive appearing lytic or blastic lesions noted in the visualized portions of the skeleton. IMPRESSION: 1. Mild hepatic steatosis. Electronically Signed: By: Vinnie Langton M.D. On: 12/03/2015 13:20    ASSESSMENT AND PLAN:  Active Problems:   AF (atrial fibrillation) (HCC)  Had AF ablation performed yesterday with isolation of pulmonary veins.  Went back into AF today at 7am.  Terrel Nesheiwat plan for cardioversion this AM to restore sinus rhythm.  Plan for discharge today post  cardioversion.  Risks and benefits of the procedure explained.  Patient understands the risks and has agreed to cardioversion to restore sinus rhythm.  Jamicah Anstead [name], MD 12/11/2015 7:24 AM

## 2015-12-11 NOTE — Discharge Instructions (Signed)
No driving for 1 week. No lifting over 5 lbs for 1 week. No sexual or vigorous activity for 1 week. You may return to work on . Keep procedure site clean & dry. If you notice increased pain, swelling, bleeding or pus, call/return!  You may shower, but no soaking baths/hot tubs/pools for 1 week.     You have an appointment set up with the [facility].  Multiple studies have shown that being followed by a dedicated atrial fibrillation clinic in addition to the standard care you receive from your other physicians improves health. We believe that enrollment in the atrial fibrillation clinic will allow [place] to better care for you.   The phone number to the [facility] is [phone]. The clinic is staffed Monday through Friday from 8:30am to 5pm.  Parking Directions: The clinic is located in the Heart and Vascular Building connected to [facility] 1)From [address] turn on to [address] and go to the 3rd entrance  (Heart and Vascular entrance) on the right. 2)Look to the right for Heart &Vascular Parking Garage. 3)A code for the entrance is required please call the clinic to receive this.   4)Take the elevators to the 1st floor. Registration is in the room with the glass walls at the end of the hallway.  If you have any trouble parking or locating the clinic, please dont hesitate to call [phone]. ____________________________________________________________________________________________________________________________________ Information on my medicine - ELIQUIS (apixaban)  This medication education was reviewed with me or my healthcare representative as part of my discharge preparation.  The pharmacist that spoke with me during my hospital stay was:  Myrene Galas, [facility]  Why was Eliquis prescribed for you? Eliquis was prescribed for you to reduce the risk of a blood clot forming that can cause a stroke if you have a medical condition called  atrial fibrillation (a type of irregular heartbeat).  What do You need to know about Eliquis ? Take your Eliquis TWICE DAILY - one tablet in the morning and one tablet in the evening with or without food. If you have difficulty swallowing the tablet whole please discuss with your pharmacist how to take the medication safely.  Take Eliquis exactly as prescribed by your doctor and DO NOT stop taking Eliquis without talking to the doctor who prescribed the medication.  Stopping may increase your risk of developing a stroke.  Refill your prescription before you run out.  After discharge, you should have regular check-up appointments with your healthcare provider that is prescribing your Eliquis.  In the future your dose may need to be changed if your kidney function or weight changes by a significant amount or as you get older.  What do you do if you miss a dose? If you miss a dose, take it as soon as you remember on the same day and resume taking twice daily.  Do not take more than one dose of ELIQUIS at the same time to make up a missed dose.  Important Safety Information A possible side effect of Eliquis is bleeding. You should call your healthcare provider right away if you experience any of the following: ? Bleeding from an injury or your nose that does not stop. ? Unusual colored urine (red or dark brown) or unusual colored stools (red or black). ? Unusual bruising for unknown reasons. ? A serious fall or if you hit your head (even if there is no bleeding).  Some medicines may interact with Eliquis and might  increase your risk of bleeding or clotting while on Eliquis. To help avoid this, consult your healthcare provider or pharmacist prior to using any new prescription or non-prescription medications, including herbals, vitamins, non-steroidal anti-inflammatory drugs (NSAIDs) and supplements.  This website has more information on Eliquis (apixaban):  http://www.eliquis.com/eliquis/home

## 2015-12-15 MED FILL — Midazolam HCl Inj 2 MG/2ML (Base Equivalent): INTRAMUSCULAR | Qty: 2 | Status: AC

## 2015-12-24 ENCOUNTER — Ambulatory Visit (HOSPITAL_COMMUNITY): Payer: Medicaid Other | Admitting: Nurse Practitioner

## 2015-12-30 ENCOUNTER — Encounter (HOSPITAL_COMMUNITY): Payer: Self-pay | Admitting: Nurse Practitioner

## 2015-12-30 ENCOUNTER — Ambulatory Visit (HOSPITAL_COMMUNITY)
Admission: RE | Admit: 2015-12-30 | Discharge: 2015-12-30 | Disposition: A | Discharge status: Home / Self Care | Payer: Medicaid Other | Source: Ambulatory Visit | Attending: Nurse Practitioner | Admitting: Nurse Practitioner

## 2015-12-30 VITALS — BP 144/100 | HR 63 | Ht 73.0 in | Wt 269.2 lb

## 2015-12-30 DIAGNOSIS — Z8679 Personal history of other diseases of the circulatory system: Secondary | ICD-10-CM | POA: Diagnosis not present

## 2015-12-30 DIAGNOSIS — Z9889 Other specified postprocedural states: Secondary | ICD-10-CM | POA: Diagnosis not present

## 2015-12-30 DIAGNOSIS — I480 Paroxysmal atrial fibrillation: Secondary | ICD-10-CM | POA: Insufficient documentation

## 2015-12-30 DIAGNOSIS — I4891 Unspecified atrial fibrillation: Secondary | ICD-10-CM | POA: Diagnosis present

## 2015-12-30 MED ORDER — AMIODARONE HCL 200 MG PO TABS: 200.0000 mg | ORAL_TABLET | Freq: Every day | ORAL | Status: DC

## 2015-12-30 NOTE — Patient Instructions (Signed)
Your physician has recommended you make the following change in your medication:  1)Decrease Amiodarone to 200mg once a day  

## 2015-12-31 ENCOUNTER — Encounter (HOSPITAL_COMMUNITY): Payer: Self-pay | Admitting: Nurse Practitioner

## 2015-12-31 NOTE — Progress Notes (Signed)
Patient ID: Jason Stokes, male   DOB: [birth year], 55 y.o.   MRN: [id number]     Primary Care Physician: Jason Ends, MD Referring Physician: Dr. Candyce Stokes is a 55 y.o. male with a h/o afib ablation 12/12/15 in the afib clinic for f/u. He reports no sensation of irregular heart beat. No swallowing difficulties or groin issues. He did have afib right after the procedure and amiodarone was increased to 200 mg bid. Jason decrease to 200 mg once a day today. Continues with eliquis without missed doses. Wants to have back surgery in the near future but Jason have to wait until the 3 month healing period before can stop blood thinner. Can discuss with Jason Stokes on f/u 8/28 for proper timing of back surgery.  Today, he denies symptoms of palpitations, chest pain, shortness of breath, orthopnea, PND, lower extremity edema, dizziness, presyncope, syncope, or neurologic sequela. The patient is tolerating medications without difficulties and is otherwise without complaint today.   Past Medical History  Diagnosis Date  . Chronic lower back pain   . Hypertension   . Depression   . Small vessel disease ([place])     Right basal ganglia stroke  . History of alcohol abuse   . Sciatica neuralgia   . PAF (paroxysmal atrial fibrillation) ([place]) 03/27/2015    a. Myoview neg for ischemia >> Flecainide started 10/16 >> FU ETT   . History of nuclear stress test     Myoview 10/16: EF 50%, diaphragmatic attenuation, no ischemia, low risk  . GERD (gastroesophageal reflux disease)   . Migraine 2012-2014  . Stroke [facility]) "between 2012-2014"    residual "AF" (09/22/2015)  . Anxiety   . Bipolar disorder ([place])   . Schizophrenia [facility])    Past Surgical History  Procedure Laterality Date  . Intercostal nerve block  2005  . Cyst excision  1996-97    surgery back of head   . Atrial fibrillation ablation  09/22/2015  . Knee arthroscopy Right 2016  . Ganglion cyst excision Left   . Electrophysiologic  study N/A 09/22/2015    Procedure: Atrial Fibrillation Ablation;  Surgeon: Jason [name], MD;  Location: [place] [facility];  Service: Cardiovascular;  Laterality: N/A;  . Electrophysiologic study N/A 12/10/2015    Procedure: Atrial Fibrillation Ablation;  Surgeon: Jason [name], MD;  Location: [place] [facility];  Service: Cardiovascular;  Laterality: N/A;  . Electrophysiologic study N/A 12/11/2015    Procedure: Cardioversion;  Surgeon: Jason [name], MD;  Location: [place] [facility];  Service: Cardiovascular;  Laterality: N/A;    Current Outpatient Prescriptions  Medication Sig Dispense Refill  . ABILIFY 15 MG tablet Take 15 mg by mouth daily.  1  . amiodarone (PACERONE) 200 MG tablet Take 1 tablet (200 mg total) by mouth daily. 60 tablet 3  . apixaban (ELIQUIS) 5 MG TABS tablet Take 1 tablet (5 mg total) by mouth 2 (two) times daily. 60 tablet 6  . atenolol (TENORMIN) 50 MG tablet Take 1 tablet (50 mg total) by mouth 2 (two) times daily. 60 tablet 3  . carbamazepine (TEGRETOL) 200 MG tablet Take 200 mg by mouth 2 (two) times daily.    . CYMBALTA 60 MG capsule Take 60 mg by mouth daily.  1  . diltiazem (CARDIZEM CD) 180 MG 24 hr capsule Take 1 capsule (180 mg total) by mouth daily. 90 capsule 3  . fluticasone (FLONASE) 50 MCG/ACT nasal spray Place 2  sprays into both nostrils daily. 16 g 0  . hydrALAZINE (APRESOLINE) 25 MG tablet Take 1 tablet (25 mg total) by mouth 3 (three) times daily. 90 tablet 11  . levETIRAcetam (KEPPRA) 250 MG tablet Take 500 mg by mouth 2 (two) times daily.    Marland Kitchen omeprazole (PRILOSEC) 20 MG capsule Take 1 capsule (20 mg total) by mouth daily. 30 capsule 2  . oxyCODONE (ROXICODONE) 5 MG immediate release tablet Take 1 tablet (5 mg total) by mouth every 6 (six) hours as needed for severe pain. 12 tablet 0  . PAZEO 0.7 % SOLN Place 1 drop into both eyes daily as needed.  5  . triamcinolone ointment (KENALOG) 0.5 % Apply 1 application topically 2 (two) times  daily. 30 g 0  . trimethoprim-polymyxin b (POLYTRIM) ophthalmic solution Place 1 drop into the right eye every 4 (four) hours. 10 mL 0  . VOLTAREN 1 % GEL Apply 1 application topically 4 (four) times daily.  2  . traZODone (DESYREL) 100 MG tablet Take 100-200 mg by mouth at bedtime.  2   No current facility-administered medications for this encounter.    Allergies  Allergen Reactions  . Lisinopril Anaphylaxis    Swelling of lips and tongue.    Social History   Social History  . Marital Status: Single    Spouse Name: Jason Stokes  . Number of Children: 3  . Years of Education: HS   Occupational History  .      disabled   Social History Main Topics  . Smoking status: Former Smoker -- 1.00 packs/day for 29 years    Types: Cigarettes    Quit date: 08/19/2015  . Smokeless tobacco: Never Used  . Alcohol Use: 0.0 oz/week    0 Standard drinks or equivalent per week     Comment: "stopped drinking heavily in 2013; drank socially til 2016" (09/22/2015)  . Drug Use: Yes    Special: Marijuana     Comment: 09/22/2015 "marijuana for pain when I can get it"  . Sexual Activity: Not Currently   Other Topics Concern  . Not on file   Social History Narrative   Patient lives at home with Jason Stokes.    Patient has 3 children 2 step.    Patient has 13 years of schooling.    Patient is right handed.     Family History  Problem Relation Age of Onset  . Heart disease Father   . Schizophrenia Sister     ROS- All systems are reviewed and negative except as per the HPI above  Physical Exam: Filed Vitals:   12/30/15 1137  BP: 144/100  Pulse: 63  Height: 6\' 1"  (1.854 m)  Weight: 269 lb 3.2 oz (122.108 kg)    GEN- The patient is well appearing, alert and oriented x 3 today.   Head- normocephalic, atraumatic Eyes-  Sclera clear, conjunctiva pink Ears- hearing intact Oropharynx- clear Neck- supple, no JVP Lymph- no cervical lymphadenopathy Lungs- Clear to ausculation bilaterally, normal  work of breathing Heart- Regular rate and rhythm, no murmurs, rubs or gallops, PMI not laterally displaced GI- soft, NT, ND, + BS Extremities- no clubbing, cyanosis, or edema MS- no significant deformity or atrophy Skin- no rash or lesion Psych- euthymic mood, full affect Neuro- strength and sensation are intact  EKG-NSR at 63 bpm, pr int 154 ms, qrs int 78 ms, qtc 433 ms Epic records reviewed  Assessment and Plan: 1. Afib s/p ablation Doing well without any evidence of afib  Decrease amiodarone to 200 mg qd Continue eliquis without interruption Back surgery Jason have to be delayed until he speaks to Jason Stokes 8/28 due to not being able to stop blood thinner until further evaluated  F/u with Jason Stokes as scheduled 8/28 afib clinic as needed  Butch Penny C. Adaijah Endres, [facility] [address] [place], [place] [address] [phone]

## 2016-01-04 ENCOUNTER — Other Ambulatory Visit: Payer: Self-pay | Admitting: Internal Medicine

## 2016-01-06 ENCOUNTER — Ambulatory Visit (HOSPITAL_COMMUNITY): Payer: Medicaid Other | Admitting: Nurse Practitioner

## 2016-02-09 ENCOUNTER — Telehealth: Payer: Self-pay | Admitting: Family Medicine

## 2016-02-09 NOTE — Telephone Encounter (Signed)
Patient would like tylenol 3 for back pain. Please follow up.

## 2016-02-09 NOTE — Telephone Encounter (Signed)
Patient will need OV for tylenol #3 refill

## 2016-02-14 ENCOUNTER — Other Ambulatory Visit: Payer: Self-pay | Admitting: Internal Medicine

## 2016-02-14 ENCOUNTER — Other Ambulatory Visit (HOSPITAL_COMMUNITY): Payer: Self-pay | Admitting: Nurse Practitioner

## 2016-02-22 ENCOUNTER — Other Ambulatory Visit: Payer: Self-pay | Admitting: Internal Medicine

## 2016-03-13 NOTE — Progress Notes (Signed)
Electrophysiology Office Note   Date:  03/14/2016   ID:  Jason Stokes, DOB [birth year], MRN [id number]  PCP:  [name], MD  Cardiologist:  [name] Primary Electrophysiologist: Will [name], MD    Chief Complaint  Patient presents with  . Follow-up    3 months post afib ablation     History of Present Illness: Jason Stokes is a 55 y.o. male who presents today for electrophysiology evaluation.   He is a history of paroxysmal atrial fibrillation, hypertension and remote CVA. He presented to the emergency room with atrial fibrillation and RVR. He was started on Eliquis L diltiazem at that time. He did not follow-up with cardiology until 03/27/15 when he complained of palpitations. At that time he was in sinus rhythm. Event monitor demonstrated recurrent episodes of atrial fibrillation with heart rates in the 130s. He is a stress test done which was low risk for ischemia. He was started on flecainide for rhythm control. He had follow-up in December in the [place] clinic at that time he had stopped his flecainide. He was having symptoms such as diaphoresis palpitations and lightheadedness. When he was seen in the A. fib clinic he was feeling better off of his flecainide. He reports only having atrial fibrillation for 20-30 minutes once to twice per week. He had AF ablation performed on 09/22/15. Unfortunately during his procedure, it was noted that he had a clot in his left atrium and the procedure was therefore canceled. Had repeat ablation 12/10/15.  Currently, he denies symptoms of palpitations, chest pain, shortness of breath, orthopnea, PND, lower extremity edema, claudication, dizziness, presyncope, syncope, bleeding, or neurologic sequela. The patient is tolerating medications without difficulties and is otherwise without complaint today. He does say his kidneys have been hurting, and he feels like this is due to his amiodarone.   Past Medical History:  Diagnosis Date    . Anxiety   . Bipolar disorder ([place])   . Chronic lower back pain   . Depression   . GERD (gastroesophageal reflux disease)   . History of alcohol abuse   . History of nuclear stress test    Myoview 10/16: EF 50%, diaphragmatic attenuation, no ischemia, low risk  . Hypertension   . Migraine 2012-2014  . PAF (paroxysmal atrial fibrillation) ([place]) 03/27/2015   a. Myoview neg for ischemia >> Flecainide started 10/16 >> FU ETT   . Schizophrenia ([place])   . Sciatica neuralgia   . Small vessel disease ([place])    Right basal ganglia stroke  . Stroke [facility]) "between 2012-2014"   residual "AF" (09/22/2015)   Past Surgical History:  Procedure Laterality Date  . ATRIAL FIBRILLATION ABLATION  09/22/2015  . CYST EXCISION  1996-97   surgery back of head   . ELECTROPHYSIOLOGIC STUDY N/A 09/22/2015   Procedure: Atrial Fibrillation Ablation;  Surgeon: Will [name], MD;  Location: [facility];  Service: Cardiovascular;  Laterality: N/A;  . ELECTROPHYSIOLOGIC STUDY N/A 12/10/2015   Procedure: Atrial Fibrillation Ablation;  Surgeon: Will [name], MD;  Location: [place] [facility];  Service: Cardiovascular;  Laterality: N/A;  . ELECTROPHYSIOLOGIC STUDY N/A 12/11/2015   Procedure: Cardioversion;  Surgeon: Will [name], MD;  Location: [place] [facility];  Service: Cardiovascular;  Laterality: N/A;  . GANGLION CYST EXCISION Left   . INTERCOSTAL NERVE BLOCK  2005  . KNEE ARTHROSCOPY Right 2016     Current Outpatient Prescriptions  Medication Sig Dispense Refill  . ABILIFY  15 MG tablet Take 15 mg by mouth daily.  1  . acetaminophen-codeine (TYLENOL #3) 300-30 MG tablet TAKE 1-2 TABLETS BY MOUTH EVERY 8 HOURS AS NEEDED FOR MODERATE PAIN 60 tablet 0  . amiodarone (PACERONE) 200 MG tablet Take 1 tablet (200 mg total) by mouth daily. 60 tablet 3  . apixaban (ELIQUIS) 5 MG TABS tablet Take 1 tablet (5 mg total) by mouth 2 (two) times daily. 60 tablet 6  . atenolol (TENORMIN) 50 MG tablet  TAKE 1 TABLET BY MOUTH 2 TIMES DAILY. 60 tablet 3  . carbamazepine (TEGRETOL) 200 MG tablet Take 200 mg by mouth 2 (two) times daily.    . CYMBALTA 60 MG capsule Take 60 mg by mouth daily.  1  . diltiazem (CARDIZEM CD) 180 MG 24 hr capsule Take 1 capsule (180 mg total) by mouth daily. 90 capsule 3  . fluticasone (FLONASE) 50 MCG/ACT nasal spray Place 2 sprays into both nostrils daily. 16 g 0  . hydrALAZINE (APRESOLINE) 25 MG tablet Take 1 tablet (25 mg total) by mouth 3 (three) times daily. 90 tablet 11  . levETIRAcetam (KEPPRA) 250 MG tablet Take 500 mg by mouth 2 (two) times daily.    Marland Kitchen omeprazole (PRILOSEC) 20 MG capsule TAKE 1 CAPSULE (20 MG TOTAL) BY MOUTH DAILY. 30 capsule 2  . oxyCODONE (ROXICODONE) 5 MG immediate release tablet Take 1 tablet (5 mg total) by mouth every 6 (six) hours as needed for severe pain. 12 tablet 0  . PAZEO 0.7 % SOLN Place 1 drop into both eyes daily as needed.  5  . traZODone (DESYREL) 100 MG tablet Take 100-200 mg by mouth at bedtime.  2  . triamcinolone ointment (KENALOG) 0.5 % Apply 1 application topically 2 (two) times daily. 30 g 0  . trimethoprim-polymyxin b (POLYTRIM) ophthalmic solution Place 1 drop into the right eye every 4 (four) hours. 10 mL 0  . VOLTAREN 1 % GEL Apply 1 application topically 4 (four) times daily.  2   No current facility-administered medications for this visit.     Allergies:   Lisinopril   Social History:  The patient  reports that he quit smoking about 6 months ago. His smoking use included Cigarettes. He has a 29.00 pack-year smoking history. He has never used smokeless tobacco. He reports that he drinks alcohol. He reports that he uses drugs, including Marijuana.   Family History:  The patient's family history includes Heart disease in his father; Schizophrenia in his sister.    ROS:  Please see the history of present illness.   Otherwise, review of systems is positive for leg pain, SOB, chest pain, back/muscle pain.   All  other systems are reviewed and negative.    PHYSICAL EXAM: VS:  BP (!) 140/106   Pulse (!) 55   Ht 6\' 1"  (1.854 m)   Wt 260 lb 6.4 oz (118.1 kg)   BMI 34.36 kg/m  , BMI Body mass index is 34.36 kg/m. GEN: Well nourished, well developed, in no acute distress  HEENT: normal  Neck: no JVD, carotid bruits, or masses Cardiac: RRR; no murmurs, rubs, or gallops,no edema  Respiratory:  clear to auscultation bilaterally, normal work of breathing GI: soft, nontender, nondistended, + BS MS: no deformity or atrophy  Skin: warm and dry Neuro:  Strength and sensation are intact Psych: euthymic mood, full affect  EKG:  EKG is ordered today. Personal review of the ekg ordered today shows sinus rhythm, rate 55  Recent  Labs: 11/30/2015: ALT 20; BUN 12; Creat 1.48; Hemoglobin 13.7; Platelets 206; Potassium 3.9; Sodium 137; TSH 1.38    Lipid Panel     Component Value Date/Time   CHOL 174 10/17/2013 1042   TRIG 180 (H) 10/17/2013 1042   HDL 32 (L) 10/17/2013 1042   CHOLHDL 5.4 10/17/2013 1042   VLDL 36 10/17/2013 1042   LDLCALC 106 (H) 10/17/2013 1042     Wt Readings from Last 3 Encounters:  03/14/16 260 lb 6.4 oz (118.1 kg)  12/30/15 269 lb 3.2 oz (122.1 kg)  12/10/15 264 lb 8.8 oz (120 kg)     Other studies Reviewed: Additional studies/ records that were reviewed today include:  SPECT 05/15/15, TTE 03/31/15 Review of the above records today demonstrates:  SPECT  The left ventricular ejection fraction is mildly decreased (45-54%).  Nuclear stress EF: 50%.  There was no ST segment deviation noted during stress.  Defect 1: Moderate sized, mild in intensity, fixed defect in the mid and basal inferior and basal inferolateral wall consistent with diaphragmatic attenuation. No ischemia noted.  This is a low risk study.  TTE - Normal LV systolic function; grade 1 diastolic dysfunction; mildLAE.  ASSESSMENT AND PLAN:  1.  Paroxysmal atrial fibrillation: Has a chads 2 vasc score  of 3 for hypertension and previous stroke. He is on Eliquis for anticoagulation. AF ablation 12/10/15. He has done well since ablation without any symptoms of palpitations. He was put on amiodarone, as he did have ERAF. He says that he would like to stop the amiodarone today. No other changes to medications.  This patients CHA2DS2-VASc Score and unadjusted Ischemic Stroke Rate (% per year) is equal to 3.2 % stroke rate/year from a score of 3  Above score calculated as 1 point each if present [CHF, HTN, DM, Vascular=MI/PAD/Aortic Plaque, Age if 65-74, or Male] Above score calculated as 2 points each if present [Age > 75, or Stroke/TIA/TE]  2. Hypertension: Minor elevation today, he will check his blood pressures in follow-up.   Current medicines are reviewed at length with the patient today.   The patient does not have concerns regarding his medicines.  The following changes were made today:  Stop amiodarone  Labs/ tests ordered today include:  No orders of the defined types were placed in this encounter.    Disposition:   FU with Will Camnitz 6 months.  Signed, Will [name], MD  03/14/2016 11:04 AM     [address] [place] [address] [phone] (office) [phone] (fax)

## 2016-03-14 ENCOUNTER — Encounter: Payer: Self-pay | Admitting: Cardiology

## 2016-03-14 ENCOUNTER — Encounter (INDEPENDENT_AMBULATORY_CARE_PROVIDER_SITE_OTHER): Payer: Self-pay

## 2016-03-14 ENCOUNTER — Ambulatory Visit (INDEPENDENT_AMBULATORY_CARE_PROVIDER_SITE_OTHER): Payer: Medicaid Other | Admitting: Cardiology

## 2016-03-14 VITALS — BP 140/106 | HR 55 | Ht 73.0 in | Wt 260.4 lb

## 2016-03-14 DIAGNOSIS — I480 Paroxysmal atrial fibrillation: Secondary | ICD-10-CM | POA: Diagnosis not present

## 2016-03-14 NOTE — Addendum Note (Signed)
Addended by: Stanton Kidney on: 03/14/2016 11:16 AM   Modules accepted: Orders

## 2016-03-14 NOTE — Patient Instructions (Signed)
Medication Instructions:  Your physician has recommended you make the following change in your medication:  1) STOP Amiodarone  Labwork: None ordered  Testing/Procedures: None ordered  Follow-Up: Your physician wants you to follow-up in: 6 months with Dr. Curt Bears. You will receive a reminder letter in the mail two months in advance. If you don't receive a letter, please call our office to schedule the follow-up appointment.  If you need a refill on your cardiac medications before your next appointment, please call your pharmacy.  Thank you for choosing [facility]!!   Trinidad Curet, RN [phone]

## 2016-04-09 ENCOUNTER — Other Ambulatory Visit: Payer: Self-pay | Admitting: Cardiology

## 2016-05-08 ENCOUNTER — Other Ambulatory Visit: Payer: Self-pay | Admitting: Family Medicine

## 2016-05-12 ENCOUNTER — Ambulatory Visit: Payer: Medicaid Other | Attending: Family Medicine | Admitting: Pharmacist

## 2016-05-12 DIAGNOSIS — Z23 Encounter for immunization: Secondary | ICD-10-CM

## 2016-06-06 ENCOUNTER — Ambulatory Visit: Payer: Medicaid Other | Admitting: Family Medicine

## 2016-06-06 ENCOUNTER — Encounter (INDEPENDENT_AMBULATORY_CARE_PROVIDER_SITE_OTHER): Payer: Self-pay

## 2016-06-06 ENCOUNTER — Ambulatory Visit (INDEPENDENT_AMBULATORY_CARE_PROVIDER_SITE_OTHER): Payer: Medicaid Other | Admitting: Physician Assistant

## 2016-06-15 ENCOUNTER — Ambulatory Visit (INDEPENDENT_AMBULATORY_CARE_PROVIDER_SITE_OTHER): Payer: Medicaid Other | Admitting: Physician Assistant

## 2016-06-15 ENCOUNTER — Encounter (INDEPENDENT_AMBULATORY_CARE_PROVIDER_SITE_OTHER): Payer: Self-pay

## 2016-06-15 DIAGNOSIS — M25561 Pain in right knee: Secondary | ICD-10-CM

## 2016-06-15 DIAGNOSIS — G8929 Other chronic pain: Secondary | ICD-10-CM

## 2016-06-15 MED ORDER — ACETAMINOPHEN-CODEINE #3 300-30 MG PO TABS: 1.0000 | ORAL_TABLET | Freq: Four times a day (QID) | ORAL | 0 refills | Status: DC | PRN

## 2016-06-15 NOTE — Progress Notes (Signed)
Office Visit Note   [name]           Date of Birth: 03-Apr-1961           MRN: [id number] Visit Date: 06/15/2016              Requested by: Boykin Nearing, MD [address] [place], [place] [address] PCP: [name], MD   Assessment & Plan: Visit Diagnoses: No diagnosis found.  Plan: MRI right knee to evaluate for progressive cartilage loss versus meniscal tear. After the MRI go over results and discuss further treatment  Follow-Up Instructions: Return in about 2 weeks (around 06/29/2016).   Orders:  No orders of the defined types were placed in this encounter.  Meds ordered this encounter  Medications  . acetaminophen-codeine (TYLENOL #3) 300-30 MG tablet    Sig: Take 1 tablet by mouth every 6 (six) hours as needed for moderate pain.    Dispense:  40 tablet    Refill:  0    Not to exceed 5 additional fills before 04/20/2016.      Procedures: No procedures performed   Clinical Data: No additional findings.   Subjective: Chief Complaint  Patient presents with  . Right Knee - Pain    Patient here today stating he is having a lot of knee pain. States he is aware he is bone-on-bone and needs surgery. Apparently he has had a lot of heart issues is why he couldn't have surgery before. He ambulates with cane. 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    Review of Systems See HPI  Objective: Vital Signs: There were no vitals taken for this visit.  Physical Exam  Constitutional: He is oriented to person, place, and time. He appears well-developed and well-nourished. No distress.  Neurological: He is alert and oriented to person, place, and time.  Skin: Skin is warm and dry.    Ortho Exam Right knee no effusion abnormal warmth or erythema. No instability valgus varus stressing. Varus deformity easily correctable. Full extension and  full flexion of the knee. Tenderness over the medial joint line of the knee only. Specialty Comments:  No specialty comments available.  Imaging: No results found.   PMFS History: Patient Active Problem List   Diagnosis Date Noted  . Paroxysmal atrial fibrillation (HCC)   . AF (atrial fibrillation) ([place]) 12/10/2015  . Allergic conjunctivitis 07/10/2015  . Eczema 07/10/2015  . Sweating 07/10/2015  . History of CVA (cerebrovascular accident) 05/04/2015  . PAF (paroxysmal atrial fibrillation) ([place]) 03/27/2015  . Tear of medial meniscus of right knee 03/18/2015  . Chronic pain of right knee 03/12/2015  . Other and unspecified hyperlipidemia 11/25/2013  . Nasal congestion 11/25/2013  . Sinusitis, chronic 05/09/2013  . Chronic low back pain 10/12/2012  . Sciatica neuralgia 10/12/2012  . Hypertension 10/12/2012  . Depression 10/12/2012  . Insomnia 10/12/2012  . Allergic rhinitis 10/12/2012   Past Medical History:  Diagnosis Date  . Anxiety   . Bipolar disorder ([place])   . Chronic lower back pain   . Depression   . GERD (gastroesophageal reflux disease)   . History of alcohol abuse   . History of nuclear stress test    Myoview 10/16: EF 50%, diaphragmatic attenuation, no ischemia, low risk  . Hypertension   . Migraine 2012-2014  . PAF (paroxysmal atrial fibrillation) ([place]) 03/27/2015   a. Myoview neg for  ischemia >> Flecainide started 10/16 >> FU ETT   . Schizophrenia ([facility])   . Sciatica neuralgia   . Small vessel disease    Right basal ganglia stroke  . Stroke [facility]) "between 2012-2014"   residual "AF" (09/22/2015)    Family History  Problem Relation Age of Onset  . Heart disease Father   . Schizophrenia Sister     Past Surgical History:  Procedure Laterality Date  . ATRIAL FIBRILLATION ABLATION  09/22/2015  . CYST EXCISION  1996-97   surgery back of head   . ELECTROPHYSIOLOGIC STUDY N/A 09/22/2015   Procedure: Atrial Fibrillation Ablation;  Surgeon: Will [name],  MD;  Location: [facility];  Service: Cardiovascular;  Laterality: N/A;  . ELECTROPHYSIOLOGIC STUDY N/A 12/10/2015   Procedure: Atrial Fibrillation Ablation;  Surgeon: Will [name], MD;  Location: [facility];  Service: Cardiovascular;  Laterality: N/A;  . ELECTROPHYSIOLOGIC STUDY N/A 12/11/2015   Procedure: Cardioversion;  Surgeon: Will [name], MD;  Location: [facility];  Service: Cardiovascular;  Laterality: N/A;  . GANGLION CYST EXCISION Left   . INTERCOSTAL NERVE BLOCK  2005  . KNEE ARTHROSCOPY Right 2016   Social History   Occupational History  .      disabled   Social History Main Topics  . Smoking status: Former Smoker    Packs/day: 1.00    Years: 29.00    Types: Cigarettes    Quit date: 08/19/2015  . Smokeless tobacco: Never Used  . Alcohol use 0.0 oz/week     Comment: "stopped drinking heavily in 2013; drank socially til 2016" (09/22/2015)  . Drug use:     Types: Marijuana     Comment: 09/22/2015 "marijuana for pain when I can get it"  . Sexual activity: Not Currently

## 2016-06-16 ENCOUNTER — Other Ambulatory Visit (INDEPENDENT_AMBULATORY_CARE_PROVIDER_SITE_OTHER): Payer: Self-pay

## 2016-06-16 DIAGNOSIS — M25561 Pain in right knee: Principal | ICD-10-CM

## 2016-06-16 DIAGNOSIS — G8929 Other chronic pain: Secondary | ICD-10-CM

## 2016-06-21 ENCOUNTER — Ambulatory Visit: Payer: Medicaid Other | Attending: Family Medicine | Admitting: Family Medicine

## 2016-06-21 ENCOUNTER — Encounter: Payer: Self-pay | Admitting: Family Medicine

## 2016-06-21 DIAGNOSIS — Z87891 Personal history of nicotine dependence: Secondary | ICD-10-CM | POA: Diagnosis not present

## 2016-06-21 DIAGNOSIS — I10 Essential (primary) hypertension: Secondary | ICD-10-CM | POA: Diagnosis not present

## 2016-06-21 DIAGNOSIS — Z23 Encounter for immunization: Secondary | ICD-10-CM | POA: Diagnosis not present

## 2016-06-21 DIAGNOSIS — Z79899 Other long term (current) drug therapy: Secondary | ICD-10-CM | POA: Diagnosis not present

## 2016-06-21 DIAGNOSIS — M65341 Trigger finger, right ring finger: Secondary | ICD-10-CM

## 2016-06-21 DIAGNOSIS — I480 Paroxysmal atrial fibrillation: Secondary | ICD-10-CM

## 2016-06-21 DIAGNOSIS — J321 Chronic frontal sinusitis: Secondary | ICD-10-CM | POA: Diagnosis not present

## 2016-06-21 MED ORDER — FLUTICASONE PROPIONATE 50 MCG/ACT NA SUSP: 2.0000 | Freq: Every day | NASAL | 3 refills | Status: DC

## 2016-06-21 MED ORDER — DILTIAZEM HCL ER COATED BEADS 180 MG PO CP24: 180.0000 mg | ORAL_CAPSULE | Freq: Every day | ORAL | 3 refills | Status: DC

## 2016-06-21 MED ORDER — HYDRALAZINE HCL 50 MG PO TABS: 50.0000 mg | ORAL_TABLET | Freq: Three times a day (TID) | ORAL | 3 refills | Status: DC

## 2016-06-21 MED ORDER — AMOXICILLIN 500 MG PO CAPS: 500.0000 mg | ORAL_CAPSULE | Freq: Three times a day (TID) | ORAL | 0 refills | Status: DC

## 2016-06-21 NOTE — Progress Notes (Signed)
Pt is here today for a possible sinus infection. Pt also states that he is having pain in left ring finger.  Pt is getting tdap shot.

## 2016-06-21 NOTE — Progress Notes (Signed)
Subjective:  Patient ID: Jason Stokes, male    DOB: [birth year]  Age: 55 y.o. MRN: [id number]  CC: Sinusitis   HPI TALBOT DILLMAN presents for   1. Sinus pressure: x 3 weeks. Congestion. No runny nose. No fever.   2. Finger pain: left ring finger pain and stiffness. Has hx of injury to MCP joint about 40 years ago. No recent injury. No redness. No tenderness to touch.   3. HTN: compliant with regimen. No HA, CP or SOB or leg swelling.   Social History  Substance Use Topics  . Smoking status: Former Smoker    Packs/day: 1.00    Years: 29.00    Types: Cigarettes    Quit date: 08/19/2015  . Smokeless tobacco: Never Used  . Alcohol use 0.0 oz/week     Comment: "stopped drinking heavily in 2013; drank socially til 2016" (09/22/2015)     Outpatient Medications Prior to Visit  Medication Sig Dispense Refill  . ABILIFY 15 MG tablet Take 15 mg by mouth daily.  1  . acetaminophen-codeine (TYLENOL #3) 300-30 MG tablet Take 1 tablet by mouth every 6 (six) hours as needed for moderate pain. 40 tablet 0  . apixaban (ELIQUIS) 5 MG TABS tablet Take 1 tablet (5 mg total) by mouth 2 (two) times daily. 60 tablet 6  . atenolol (TENORMIN) 50 MG tablet TAKE 1 TABLET BY MOUTH 2 TIMES DAILY. 60 tablet 3  . CYMBALTA 60 MG capsule Take 60 mg by mouth daily.  1  . diltiazem (CARDIZEM CD) 180 MG 24 hr capsule Take 1 capsule (180 mg total) by mouth daily. Please call and schedule an appointment with Dr Radford Pax 90 capsule 0  . fluticasone (FLONASE) 50 MCG/ACT nasal spray Place 2 sprays into both nostrils daily. 16 g 0  . hydrALAZINE (APRESOLINE) 25 MG tablet Take 1 tablet (25 mg total) by mouth 3 (three) times daily. 90 tablet 11  . levETIRAcetam (KEPPRA) 250 MG tablet Take 500 mg by mouth 2 (two) times daily.    Marland Kitchen omeprazole (PRILOSEC) 20 MG capsule TAKE ONE CAPSULE BY MOUTH DAILY 30 capsule 2  . PAZEO 0.7 % SOLN Place 1 drop into both eyes daily as needed.  5  . traZODone (DESYREL) 100 MG tablet Take  100-200 mg by mouth at bedtime.  2  . triamcinolone ointment (KENALOG) 0.5 % Apply 1 application topically 2 (two) times daily. 30 g 0  . trimethoprim-polymyxin b (POLYTRIM) ophthalmic solution Place 1 drop into the right eye every 4 (four) hours. 10 mL 0  . VOLTAREN 1 % GEL Apply 1 application topically 4 (four) times daily.  2  . carbamazepine (TEGRETOL) 200 MG tablet Take 200 mg by mouth 2 (two) times daily.    Marland Kitchen oxyCODONE (ROXICODONE) 5 MG immediate release tablet Take 1 tablet (5 mg total) by mouth every 6 (six) hours as needed for severe pain. (Patient not taking: Reported on 06/21/2016) 12 tablet 0   No facility-administered medications prior to visit.     ROS Review of Systems  Constitutional: Negative for chills, diaphoresis, fatigue, fever and unexpected weight change.  HENT: Positive for congestion.   Eyes: Negative for discharge and visual disturbance.  Respiratory: Negative for cough and shortness of breath.   Cardiovascular: Negative for chest pain, palpitations and leg swelling.  Gastrointestinal: Negative for abdominal pain, blood in stool, constipation, diarrhea, nausea and vomiting.  Endocrine: Negative for polydipsia, polyphagia and polyuria.  Musculoskeletal: Positive for arthralgias and back pain.  Negative for gait problem, myalgias and neck pain.  Skin: Positive for rash.  Allergic/Immunologic: Negative for immunocompromised state.  Hematological: Negative for adenopathy. Does not bruise/bleed easily.  Psychiatric/Behavioral: Negative for dysphoric mood, sleep disturbance and suicidal ideas. The patient is not nervous/anxious.     Objective:  BP (!) 156/103 (BP Location: Left Arm, Patient Position: Sitting, Cuff Size: Large)   Pulse 65   Temp 98.1 F (36.7 C) (Oral)   Ht 6\' 1"  (1.854 m)   Wt 264 lb 3.2 oz (119.8 kg)   SpO2 98%   BMI 34.86 kg/m   BP/Weight 06/21/2016 03/14/2016 [id number]  Systolic BP [id number]  Diastolic BP [id number] [id number]  Wt. (Lbs) 264.2  260.4 269.2  BMI 34.86 34.36 35.52    Physical Exam  Constitutional: He appears well-developed and well-nourished. No distress.  HENT:  Head: Normocephalic and atraumatic.  Nose: Mucosal edema present.  Eyes: Conjunctivae, EOM and lids are normal. Pupils are equal, round, and reactive to light. Lids are everted and swept, no foreign bodies found.  Neck: Normal range of motion. Neck supple.  Cardiovascular: Normal rate, regular rhythm, normal heart sounds and intact distal pulses.   Pulmonary/Chest: Effort normal and breath sounds normal.  Musculoskeletal: He exhibits no edema.       Hands: Neurological: He is alert.  Skin: Skin is warm and dry. No rash noted. No erythema.  Psychiatric: He has a normal mood and affect.     Assessment & Plan:  [place] was seen today for sinusitis.  Diagnoses and all orders for this visit:  Chronic frontal sinusitis -     amoxicillin (AMOXIL) 500 MG capsule; Take 1 capsule (500 mg total) by mouth 3 (three) times daily. -     fluticasone (FLONASE) 50 MCG/ACT nasal spray; Place 2 sprays into both nostrils daily.  PAF (paroxysmal atrial fibrillation) (HCC) -     diltiazem (CARDIZEM CD) 180 MG 24 hr capsule; Take 1 capsule (180 mg total) by mouth daily.  Essential hypertension -     hydrALAZINE (APRESOLINE) 50 MG tablet; Take 1 tablet (50 mg total) by mouth 3 (three) times daily. -     diltiazem (CARDIZEM CD) 180 MG 24 hr capsule; Take 1 capsule (180 mg total) by mouth daily.  Trigger ring finger of right hand  Other orders -     Tdap vaccine greater than or equal to 7yo IM   There are no diagnoses linked to this encounter.  No orders of the defined types were placed in this encounter.   Follow-up: No Follow-up on file.   Boykin Nearing MD

## 2016-06-21 NOTE — Patient Instructions (Addendum)
Jason Stokes was seen today for sinusitis.  Diagnoses and all orders for this visit:  Chronic frontal sinusitis -     amoxicillin (AMOXIL) 500 MG capsule; Take 1 capsule (500 mg total) by mouth 3 (three) times daily. -     fluticasone (FLONASE) 50 MCG/ACT nasal spray; Place 2 sprays into both nostrils daily.  PAF (paroxysmal atrial fibrillation) (HCC) -     diltiazem (CARDIZEM CD) 180 MG 24 hr capsule; Take 1 capsule (180 mg total) by mouth daily.  Essential hypertension -     hydrALAZINE (APRESOLINE) 50 MG tablet; Take 1 tablet (50 mg total) by mouth 3 (three) times daily. -     diltiazem (CARDIZEM CD) 180 MG 24 hr capsule; Take 1 capsule (180 mg total) by mouth daily.  Trigger ring finger of right hand  wear brace for 8-12 hrs per day Ice finger  Increase hydralazine to 50 mg three times a day. Be sure to follow up with your cardiologist  Dr. Adrian Blackwater   Trigger Finger Trigger finger (digital tendinitis and stenosing tenosynovitis) is a common disorder that causes an often painful catching of the fingers or thumb. It occurs as a clicking, snapping, or locking of a finger in the palm of the hand. This is caused by a problem with the tendons that flex or bend the fingers sliding smoothly through their sheaths. The condition may occur in any finger or a couple fingers at the same time.  The finger may lock with the finger curled or suddenly straighten out with a snap. This is more common in patients with rheumatoid arthritis and diabetes. Left untreated, the condition may get worse to the point where the finger becomes locked in flexion, like making a fist, or less commonly locked with the finger straightened out. CAUSES   Inflammation and scarring that lead to swelling around the tendon sheath.  Repeated or forceful movements.  Rheumatoid arthritis, an autoimmune disease that affects joints.  Gout.  Diabetes mellitus. SIGNS AND SYMPTOMS  Soreness and swelling of your finger.  A  painful clicking or snapping as you bend and straighten your finger. DIAGNOSIS  Your health care provider will do a physical exam of your finger to diagnose trigger finger. TREATMENT   Splinting for 6-8 weeks may be helpful.  Nonsteroidal anti-inflammatory medicines (NSAIDs) can help to relieve the pain and inflammation.  Cortisone injections, along with splinting, may speed up recovery. Several injections may be required. Cortisone may give relief after one injection.  Surgery is another treatment that may be used if conservative treatments do not work. Surgery can be minor, without incisions (a cut does not have to be made), and can be done with a needle through the skin.  Other surgical choices involve an open procedure in which the surgeon opens the hand through a small incision and cuts the pulley so the tendon can again slide smoothly. Your hand will still work fine. HOME CARE INSTRUCTIONS  Apply ice to the injured area, twice per day:  Put ice in a plastic bag.  Place a towel between your skin and the bag.  Leave the ice on for 20 minutes, 3-4 times a day.  Rest your hand often. MAKE SURE YOU:   Understand these instructions.  Will watch your condition.  Will get help right away if you are not doing well or get worse. This information is not intended to replace advice given to you by your health care provider. Make sure you discuss any questions you have  with your health care provider. Document Released: 04/23/2004 Document Revised: 03/06/2013 Document Reviewed: 12/04/2012 Elsevier Interactive Patient Education  2017 [employer].

## 2016-06-22 DIAGNOSIS — M65341 Trigger finger, right ring finger: Secondary | ICD-10-CM | POA: Insufficient documentation

## 2016-06-22 NOTE — Assessment & Plan Note (Signed)
Trigger finger Brace provided Home PT

## 2016-06-22 NOTE — Assessment & Plan Note (Signed)
Chronic sinusitis Course of amoxicillin Restart flonase

## 2016-06-22 NOTE — Assessment & Plan Note (Signed)
HTN Med: compliant Continue diltiazem Increase hydralazine to 50 mg TID from 25 mg TID

## 2016-06-25 ENCOUNTER — Ambulatory Visit
Admission: RE | Admit: 2016-06-25 | Discharge: 2016-06-25 | Disposition: A | Discharge status: Home / Self Care | Payer: Medicaid Other | Source: Ambulatory Visit | Attending: Physician Assistant | Admitting: Physician Assistant

## 2016-06-25 DIAGNOSIS — M25561 Pain in right knee: Principal | ICD-10-CM

## 2016-06-25 DIAGNOSIS — G8929 Other chronic pain: Secondary | ICD-10-CM

## 2016-07-26 ENCOUNTER — Encounter (INDEPENDENT_AMBULATORY_CARE_PROVIDER_SITE_OTHER): Payer: Self-pay

## 2016-07-26 ENCOUNTER — Ambulatory Visit (INDEPENDENT_AMBULATORY_CARE_PROVIDER_SITE_OTHER): Payer: Medicaid Other | Admitting: Orthopaedic Surgery

## 2016-07-26 DIAGNOSIS — M25561 Pain in right knee: Secondary | ICD-10-CM

## 2016-07-26 DIAGNOSIS — M65341 Trigger finger, right ring finger: Secondary | ICD-10-CM | POA: Diagnosis not present

## 2016-07-26 DIAGNOSIS — G8929 Other chronic pain: Secondary | ICD-10-CM | POA: Diagnosis not present

## 2016-07-26 MED ORDER — METHYLPREDNISOLONE ACETATE 40 MG/ML IJ SUSP
40.0000 mg | INTRAMUSCULAR | Status: AC | PRN
  Administered 2016-07-26: 40 mg

## 2016-07-26 MED ORDER — LIDOCAINE HCL 1 % IJ SOLN
1.0000 mL | INTRAMUSCULAR | Status: AC | PRN
  Administered 2016-07-26: 1 mL

## 2016-07-26 MED ORDER — LIDOCAINE HCL 1 % IJ SOLN
3.0000 mL | INTRAMUSCULAR | Status: AC | PRN
  Administered 2016-07-26: 3 mL

## 2016-07-26 MED ORDER — METHYLPREDNISOLONE ACETATE 40 MG/ML IJ SUSP
40.0000 mg | INTRAMUSCULAR | Status: AC | PRN
  Administered 2016-07-26: 40 mg via INTRA_ARTICULAR

## 2016-07-26 NOTE — Progress Notes (Signed)
Office Visit Note   [name]           Date of Birth: [birth year]           MRN: [id number] Visit Date: 07/26/2016              Requested by: Boykin Nearing, MD [address] [place], [place] [address] PCP: [name], MD   Assessment & Plan: Visit Diagnoses:  1. Chronic pain of right knee     Plan: He tolerated the injection in his right knee well. I was able to aspirate 30 mL of serous fluid from the knee before the steroid injection. He is a perfect candidate for a hyaluronic acid injection we'll order this for him and we see him back in 4 weeks we'll place injection in his right knee of hyaluronic acid. He did tolerate the trigger finger injection in his right ring finger and we'll reevaluated as well and a month. Also recommended Tumeric him to try as a supplement for his knee.  Follow-Up Instructions: Return in about 4 weeks (around 08/23/2016).   Orders:  No orders of the defined types were placed in this encounter.  No orders of the defined types were placed in this encounter.     Procedures: Large Joint Inj Date/Time: 07/26/2016 9:05 AM Performed by: Mcarthur Rossetti Authorized by: Jean Rosenthal Y   Location:  Knee Site:  L knee Ultrasound Guidance: No   Fluoroscopic Guidance: No   Arthrogram: No   Medications:  3 mL lidocaine 1 %; 40 mg methylPREDNISolone acetate 40 MG/ML Hand/UE Inj Date/Time: 07/26/2016 9:14 AM Performed by: Mcarthur Rossetti Authorized by: Mcarthur Rossetti   Condition: trigger finger   Location:  Ring finger Site:  R ring A1 Medications:  40 mg methylPREDNISolone acetate 40 MG/ML; 1 mL lidocaine 1 %     Clinical Data: No additional findings.   Subjective: Chief Complaint  Patient presents with  . Right Knee - Follow-up    MRI review   HPI He is here for follow-up of MRI of his right knee. His recurrent effusions that knee in the form of arthroscopic surgery of that knee sometime  ago. He has had a cardioversion the interim for atrial fibrillation and he is on a blood thinning medication. He says right now his right knee has been swollen and painful. He also points to his right hand ring finger over the A1 pulley as a source of triggering now he does have active triggering. Review of Systems He currently denies any chest pain, shortness of breath, fever, chills, nausea, vomiting.  Objective: Vital Signs: There were no vitals taken for this visit.  Physical Exam He is alert and oriented 3 in no acute distress Ortho Exam Condition of his right knee shows a moderate knee joint effusion. He's had good range of motion of the knee and a slight varus deformity. He does have patellofemoral crepitation. Examination of his right hand shows active triggering over the A1 pulley of the ring finger. Specialty Comments:  No specialty comments available.  Imaging: No results found. The MRI of his right knee does shows moderate arthritic changes throughout the knee but no acute tear.  PMFS History: Patient Active Problem List   Diagnosis Date Noted  . Trigger ring finger of right hand 06/22/2016  . Paroxysmal atrial fibrillation (HCC)   . AF (atrial fibrillation) ([place]) 12/10/2015  . Allergic conjunctivitis 07/10/2015  . Eczema 07/10/2015  . Sweating 07/10/2015  .  History of CVA (cerebrovascular accident) 05/04/2015  . PAF (paroxysmal atrial fibrillation) ([place]) 03/27/2015  . Tear of medial meniscus of right knee 03/18/2015  . Chronic pain of right knee 03/12/2015  . Other and unspecified hyperlipidemia 11/25/2013  . Nasal congestion 11/25/2013  . Sinusitis, chronic 05/09/2013  . Chronic low back pain 10/12/2012  . Sciatica neuralgia 10/12/2012  . Hypertension 10/12/2012  . Depression 10/12/2012  . Insomnia 10/12/2012  . Allergic rhinitis 10/12/2012   Past Medical History:  Diagnosis Date  . Anxiety   . Bipolar disorder ([place])   . Chronic lower back pain   .  Depression   . GERD (gastroesophageal reflux disease)   . History of alcohol abuse   . History of nuclear stress test    Myoview 10/16: EF 50%, diaphragmatic attenuation, no ischemia, low risk  . Hypertension   . Migraine 2012-2014  . PAF (paroxysmal atrial fibrillation) ([place]) 03/27/2015   a. Myoview neg for ischemia >> Flecainide started 10/16 >> FU ETT   . Schizophrenia ([place])   . Sciatica neuralgia   . Small vessel disease    Right basal ganglia stroke  . Stroke [facility]) "between 2012-2014"   residual "AF" (09/22/2015)    Family History  Problem Relation Age of Onset  . Heart disease Father   . Schizophrenia Sister     Past Surgical History:  Procedure Laterality Date  . ATRIAL FIBRILLATION ABLATION  09/22/2015  . CYST EXCISION  1996-97   surgery back of head   . ELECTROPHYSIOLOGIC STUDY N/A 09/22/2015   Procedure: Atrial Fibrillation Ablation;  Surgeon: Will [name], MD;  Location: [place] [facility];  Service: Cardiovascular;  Laterality: N/A;  . ELECTROPHYSIOLOGIC STUDY N/A 12/10/2015   Procedure: Atrial Fibrillation Ablation;  Surgeon: Will [name], MD;  Location: [place] [facility];  Service: Cardiovascular;  Laterality: N/A;  . ELECTROPHYSIOLOGIC STUDY N/A 12/11/2015   Procedure: Cardioversion;  Surgeon: Will [name], MD;  Location: [place] [facility];  Service: Cardiovascular;  Laterality: N/A;  . GANGLION CYST EXCISION Left   . INTERCOSTAL NERVE BLOCK  2005  . KNEE ARTHROSCOPY Right 2016   Social History   Occupational History  .      disabled   Social History Main Topics  . Smoking status: Former Smoker    Packs/day: 1.00    Years: 29.00    Types: Cigarettes    Quit date: 08/19/2015  . Smokeless tobacco: Never Used  . Alcohol use 0.0 oz/week     Comment: "stopped drinking heavily in 2013; drank socially til 2016" (09/22/2015)  . Drug use:     Types: Marijuana     Comment: 09/22/2015 "marijuana for pain when I can get it"  . Sexual activity: Not  Currently

## 2016-08-23 ENCOUNTER — Ambulatory Visit (INDEPENDENT_AMBULATORY_CARE_PROVIDER_SITE_OTHER): Payer: Medicaid Other | Admitting: Orthopaedic Surgery

## 2016-08-23 ENCOUNTER — Encounter (INDEPENDENT_AMBULATORY_CARE_PROVIDER_SITE_OTHER): Payer: Self-pay | Admitting: Orthopaedic Surgery

## 2016-08-23 DIAGNOSIS — G8929 Other chronic pain: Secondary | ICD-10-CM | POA: Diagnosis not present

## 2016-08-23 DIAGNOSIS — M25561 Pain in right knee: Secondary | ICD-10-CM | POA: Diagnosis not present

## 2016-08-23 DIAGNOSIS — M65341 Trigger finger, right ring finger: Secondary | ICD-10-CM

## 2016-08-23 MED ORDER — LIDOCAINE HCL 1 % IJ SOLN
1.0000 mL | INTRAMUSCULAR | Status: AC | PRN
  Administered 2016-08-23: 1 mL

## 2016-08-23 MED ORDER — METHYLPREDNISOLONE ACETATE 40 MG/ML IJ SUSP
40.0000 mg | INTRAMUSCULAR | Status: AC | PRN
  Administered 2016-08-23: 40 mg

## 2016-08-23 NOTE — Progress Notes (Signed)
Office Visit Note   [name]           Date of Birth: Mar 26, 1961           MRN: [id number] Visit Date: 08/23/2016              Requested by: Boykin Nearing, MD [address] [place], [place] [address] PCP: [name], MD   Assessment & Plan: Visit Diagnoses:  1. Chronic pain of right knee   2. Trigger ring finger of right hand     Plan:  He tolerated the hyaluronic acid injection well on his right knee as well as the trigger injection in the A1 pulley of his right ring finger. We'll see him back in 3 months to see how is doing overall. Obviously if he develops recurrent effusions in his right knee he'll let us know.  Follow-Up Instructions: Return in about 3 months (around 11/20/2016).   Orders:  Orders Placed This Encounter  Procedures  . Hand/Upper Extremity Injection/Arthrocentesis   No orders of the defined types were placed in this encounter.     Procedures: Hand/UE Inj Date/Time: 08/23/2016 9:13 AM Performed by: Mcarthur Rossetti Authorized by: Mcarthur Rossetti   Condition: trigger finger   Location:  Ring finger Site:  R ring A1 Medications:  1 mL lidocaine 1 %; 40 mg methylPREDNISolone acetate 40 MG/ML     Clinical Data: No additional findings.   Subjective: Chief Complaint  Patient presents with  . Right Knee - Follow-up  . Right Hand - Follow-up  . Follow-up    HPI The patient is here today for scheduled hyaluronic acid injection in his right knee but he would like to have a steroid injection in his right hand to triggering of the ring finger over the A1 pulley. He's had this before. He has no other complaints. He's had swelling intermittently in the right knee were trying to temporize things with injections such as hyaluronic acid because the other option would consider knee replacement surgery. Review of Systems He denies any recent illnesses nor fever or chills  Objective: Vital Signs: There were no vitals  taken for this visit.  Physical Exam  Ortho Exam Emanation of his right knee does show mild varus deformity with good range of motion and no effusion today. Examination of his right hand does show active triggering of the ring finger the A1 pulley with pain over the A1 pulley Specialty Comments:  No specialty comments available.  Imaging: No results found.   PMFS History: Patient Active Problem List   Diagnosis Date Noted  . Trigger ring finger of right hand 06/22/2016  . Paroxysmal atrial fibrillation (HCC)   . AF (atrial fibrillation) ([place]) 12/10/2015  . Allergic conjunctivitis 07/10/2015  . Eczema 07/10/2015  . Sweating 07/10/2015  . History of CVA (cerebrovascular accident) 05/04/2015  . PAF (paroxysmal atrial fibrillation) ([place]) 03/27/2015  . Tear of medial meniscus of right knee 03/18/2015  . Chronic pain of right knee 03/12/2015  . Other and unspecified hyperlipidemia 11/25/2013  . Nasal congestion 11/25/2013  . Sinusitis, chronic 05/09/2013  . Chronic low back pain 10/12/2012  . Sciatica neuralgia 10/12/2012  . Hypertension 10/12/2012  . Depression 10/12/2012  . Insomnia 10/12/2012  . Allergic rhinitis 10/12/2012   Past Medical History:  Diagnosis Date  . Anxiety   . Bipolar disorder ([place])   . Chronic lower back pain   . Depression   . GERD (gastroesophageal reflux disease)   .  History of alcohol abuse   . History of nuclear stress test    Myoview 10/16: EF 50%, diaphragmatic attenuation, no ischemia, low risk  . Hypertension   . Migraine 2012-2014  . PAF (paroxysmal atrial fibrillation) ([place]) 03/27/2015   a. Myoview neg for ischemia >> Flecainide started 10/16 >> FU ETT   . Schizophrenia ([place])   . Sciatica neuralgia   . Small vessel disease    Right basal ganglia stroke  . Stroke [facility]) "between 2012-2014"   residual "AF" (09/22/2015)    Family History  Problem Relation Age of Onset  . Heart disease Father   . Schizophrenia Sister     Past Surgical  History:  Procedure Laterality Date  . ATRIAL FIBRILLATION ABLATION  09/22/2015  . CYST EXCISION  1996-97   surgery back of head   . ELECTROPHYSIOLOGIC STUDY N/A 09/22/2015   Procedure: Atrial Fibrillation Ablation;  Surgeon: Will [name], MD;  Location: [place] [facility];  Service: Cardiovascular;  Laterality: N/A;  . ELECTROPHYSIOLOGIC STUDY N/A 12/10/2015   Procedure: Atrial Fibrillation Ablation;  Surgeon: Will [name], MD;  Location: [place] [facility];  Service: Cardiovascular;  Laterality: N/A;  . ELECTROPHYSIOLOGIC STUDY N/A 12/11/2015   Procedure: Cardioversion;  Surgeon: Will [name], MD;  Location: [place] [facility];  Service: Cardiovascular;  Laterality: N/A;  . GANGLION CYST EXCISION Left   . INTERCOSTAL NERVE BLOCK  2005  . KNEE ARTHROSCOPY Right 2016   Social History   Occupational History  .      disabled   Social History Main Topics  . Smoking status: Current Some Day Smoker    Packs/day: 1.00    Years: 29.00    Types: Cigarettes    Last attempt to quit: 08/19/2015  . Smokeless tobacco: Never Used  . Alcohol use 0.0 oz/week     Comment: "stopped drinking heavily in 2013; drank socially til 2016" (09/22/2015)  . Drug use: Yes    Types: Marijuana     Comment: 09/22/2015 "marijuana for pain when I can get it"  . Sexual activity: Not Currently

## 2016-09-10 ENCOUNTER — Other Ambulatory Visit (HOSPITAL_COMMUNITY): Payer: Self-pay | Admitting: Nurse Practitioner

## 2016-09-13 ENCOUNTER — Ambulatory Visit (INDEPENDENT_AMBULATORY_CARE_PROVIDER_SITE_OTHER): Payer: Medicaid Other | Admitting: Cardiology

## 2016-09-13 ENCOUNTER — Encounter (INDEPENDENT_AMBULATORY_CARE_PROVIDER_SITE_OTHER): Payer: Self-pay

## 2016-09-13 VITALS — BP 144/90 | Ht 73.0 in | Wt 265.8 lb

## 2016-09-13 DIAGNOSIS — Z79899 Other long term (current) drug therapy: Secondary | ICD-10-CM

## 2016-09-13 DIAGNOSIS — I10 Essential (primary) hypertension: Secondary | ICD-10-CM | POA: Diagnosis not present

## 2016-09-13 DIAGNOSIS — I480 Paroxysmal atrial fibrillation: Secondary | ICD-10-CM | POA: Diagnosis not present

## 2016-09-13 LAB — BASIC METABOLIC PANEL
BUN/Creatinine Ratio: 8 — ABNORMAL LOW (ref 9–20)
BUN: 13 mg/dL (ref 6–24)
CO2: 20 mmol/L (ref 18–29)
Calcium: 9.2 mg/dL (ref 8.7–10.2)
Chloride: 100 mmol/L (ref 96–106)
Creatinine, Ser: 1.57 mg/dL — ABNORMAL HIGH (ref 0.76–1.27)
GFR calc Af Amer: 57 mL/min/{1.73_m2} — ABNORMAL LOW (ref >59)
GFR calc non Af Amer: 49 mL/min/{1.73_m2} — ABNORMAL LOW (ref >59)
Glucose: 95 mg/dL (ref 65–99)
Potassium: 3.8 mmol/L (ref 3.5–5.2)
Sodium: 139 mmol/L (ref 134–144)

## 2016-09-13 LAB — CBC WITH DIFFERENTIAL/PLATELET
Basophils Absolute: 0 10*3/uL (ref 0.0–0.2)
Basos: 0 %
EOS (ABSOLUTE): 0.1 10*3/uL (ref 0.0–0.4)
Eos: 1 %
Hematocrit: 43.6 % (ref 37.5–51.0)
Hemoglobin: 14.8 g/dL (ref 13.0–17.7)
Immature Grans (Abs): 0 10*3/uL (ref 0.0–0.1)
Immature Granulocytes: 0 %
Lymphocytes Absolute: 2 10*3/uL (ref 0.7–3.1)
Lymphs: 29 %
MCH: 26.4 pg — ABNORMAL LOW (ref 26.6–33.0)
MCHC: 33.9 g/dL (ref 31.5–35.7)
MCV: 78 fL — ABNORMAL LOW (ref 79–97)
Monocytes Absolute: 0.6 10*3/uL (ref 0.1–0.9)
Monocytes: 9 %
Neutrophils Absolute: 4.2 10*3/uL (ref 1.4–7.0)
Neutrophils: 61 %
Platelets: 166 10*3/uL (ref 150–379)
RBC: 5.6 x10E6/uL (ref 4.14–5.80)
RDW: 16.7 % — ABNORMAL HIGH (ref 12.3–15.4)
WBC: 7 10*3/uL (ref 3.4–10.8)

## 2016-09-13 LAB — TSH: TSH: 1.01 u[IU]/mL (ref 0.450–4.500)

## 2016-09-13 MED ORDER — DILTIAZEM HCL ER COATED BEADS 240 MG PO CP24: 240.0000 mg | ORAL_CAPSULE | Freq: Every day | ORAL | 3 refills | Status: DC

## 2016-09-13 NOTE — Patient Instructions (Signed)
Medication Instructions:    Your physician has recommended you make the following change in your medication:  1) INCREASE Diltiazem to 240 mg once daily  --- If you need a refill on your cardiac medications before your next appointment, please call your pharmacy. ---  Labwork:  Today: BMET, CBCD & TSH  Testing/Procedures:  None ordered  Follow-Up:  Your physician recommends that you schedule a follow-up appointment in: 3 months with Dr. Curt Bears.  Thank you for choosing [facility]!!   Trinidad Curet, RN [phone]

## 2016-09-13 NOTE — Progress Notes (Signed)
Electrophysiology Office Note   Date:  09/13/2016   ID:  Jason Stokes, DOB Nov 29, 1960, MRN [id number]  PCP:  [name], MD  Cardiologist:  [name] Primary Electrophysiologist: Mostafa Yuan [name], MD    Chief Complaint  Patient presents with  . Atrial Fibrillation     History of Present Illness: Jason Stokes is a 56 y.o. male who presents today for electrophysiology evaluation.   He is a history of paroxysmal atrial fibrillation, hypertension and remote CVA. He presented to the emergency room with atrial fibrillation and RVR. He was started on Eliquis L diltiazem at that time. He did not follow-up with cardiology until 03/27/15 when he complained of palpitations. At that time he was in sinus rhythm. Event monitor demonstrated recurrent episodes of atrial fibrillation with heart rates in the 130s. He is a stress test done which was low risk for ischemia. He was started on flecainide for rhythm control. He had follow-up in December in the [place] clinic at that time he had stopped his flecainide. He was having symptoms such as diaphoresis palpitations and lightheadedness. When he was seen in the A. fib clinic he was feeling better off of his flecainide. He reports only having atrial fibrillation for 20-30 minutes once to twice per week. He had AF ablation performed on 09/22/15. Unfortunately during his procedure, it was noted that he had a clot in his left atrium and the procedure was therefore canceled. Had repeat ablation 12/10/15.  Currently, he denies symptoms of palpitations, chest pain, shortness of breath, orthopnea, PND, lower extremity edema, claudication, dizziness, presyncope, syncope, bleeding, or neurologic sequela. The patient is tolerating medications without difficulties and is otherwise without complaint today. He isn't doing much better since his ablation. He says he only gets occasional episodes of palpitations possibly once a week at the last for up to 45 minutes.  He says that he thinks that this happens when he is overdoing it. Otherwise he is without complaint.   Past Medical History:  Diagnosis Date  . Anxiety   . Bipolar disorder ([place])   . Chronic lower back pain   . Depression   . GERD (gastroesophageal reflux disease)   . History of alcohol abuse   . History of nuclear stress test    Myoview 10/16: EF 50%, diaphragmatic attenuation, no ischemia, low risk  . Hypertension   . Migraine 2012-2014  . PAF (paroxysmal atrial fibrillation) ([place]) 03/27/2015   a. Myoview neg for ischemia >> Flecainide started 10/16 >> FU ETT   . Schizophrenia ([place])   . Sciatica neuralgia   . Small vessel disease    Right basal ganglia stroke  . Stroke [facility]) "between 2012-2014"   residual "AF" (09/22/2015)   Past Surgical History:  Procedure Laterality Date  . ATRIAL FIBRILLATION ABLATION  09/22/2015  . CYST EXCISION  1996-97   surgery back of head   . ELECTROPHYSIOLOGIC STUDY N/A 09/22/2015   Procedure: Atrial Fibrillation Ablation;  Surgeon: Felicity Penix [name], MD;  Location: [place] [facility];  Service: Cardiovascular;  Laterality: N/A;  . ELECTROPHYSIOLOGIC STUDY N/A 12/10/2015   Procedure: Atrial Fibrillation Ablation;  Surgeon: Alexcia Schools [name], MD;  Location: [place] [facility];  Service: Cardiovascular;  Laterality: N/A;  . ELECTROPHYSIOLOGIC STUDY N/A 12/11/2015   Procedure: Cardioversion;  Surgeon: Ayan Yankey [name], MD;  Location: [place] [facility];  Service: Cardiovascular;  Laterality: N/A;  . GANGLION CYST EXCISION Left   . INTERCOSTAL NERVE BLOCK  2005  .  KNEE ARTHROSCOPY Right 2016     Current Outpatient Prescriptions  Medication Sig Dispense Refill  . atenolol (TENORMIN) 50 MG tablet TAKE 1 TABLET BY MOUTH 2 TIMES DAILY. 60 tablet 5  . carbamazepine (TEGRETOL) 200 MG tablet Take 200 mg by mouth 2 (two) times daily.    . CYMBALTA 60 MG capsule Take 60 mg by mouth daily.  1  . ELIQUIS 5 MG TABS tablet TAKE 1 TABLET (5 MG TOTAL) BY MOUTH 2  (TWO) TIMES DAILY. 60 tablet 5  . fluticasone (FLONASE) 50 MCG/ACT nasal spray Place 2 sprays into both nostrils daily. 16 g 3  . hydrALAZINE (APRESOLINE) 50 MG tablet Take 1 tablet (50 mg total) by mouth 3 (three) times daily. 90 tablet 3  . levETIRAcetam (KEPPRA) 250 MG tablet Take 500 mg by mouth 2 (two) times daily.    Marland Kitchen omeprazole (PRILOSEC) 20 MG capsule TAKE ONE CAPSULE BY MOUTH DAILY 30 capsule 2  . PAZEO 0.7 % SOLN Place 1 drop into both eyes daily as needed.  5  . traZODone (DESYREL) 100 MG tablet Take 100-200 mg by mouth at bedtime.  2  . triamcinolone ointment (KENALOG) 0.5 % Apply 1 application topically 2 (two) times daily. 30 g 0  . trimethoprim-polymyxin b (POLYTRIM) ophthalmic solution Place 1 drop into the right eye every 4 (four) hours. 10 mL 0  . VOLTAREN 1 % GEL Apply 1 application topically 4 (four) times daily.  2  . ABILIFY 15 MG tablet Take 15 mg by mouth daily.  1  . acetaminophen-codeine (TYLENOL #3) 300-30 MG tablet Take 1 tablet by mouth every 6 (six) hours as needed for moderate pain. (Patient not taking: Reported on 09/13/2016) 40 tablet 0  . amoxicillin (AMOXIL) 500 MG capsule Take 1 capsule (500 mg total) by mouth 3 (three) times daily. (Patient not taking: Reported on 09/13/2016) 30 capsule 0  . diltiazem (CARDIZEM CD) 240 MG 24 hr capsule Take 1 capsule (240 mg total) by mouth daily. 90 capsule 3   No current facility-administered medications for this visit.     Allergies:   Lisinopril   Social History:  The patient  reports that he has been smoking Cigarettes.  He has a 29.00 pack-year smoking history. He has never used smokeless tobacco. He reports that he drinks alcohol. He reports that he uses drugs, including Marijuana.   Family History:  The patient's family history includes Heart disease in his father; Schizophrenia in his sister.    ROS:  Please see the history of present illness.   Otherwise, review of systems is positive for none.   All other  systems are reviewed and negative.    PHYSICAL EXAM: VS:  BP (!) 144/90 (BP Location: Left Arm, Patient Position: Sitting)   Ht 6\' 1"  (1.854 m)   Wt 265 lb 12.8 oz (120.6 kg)   BMI 35.07 kg/m  , BMI Body mass index is 35.07 kg/m. GEN: Well nourished, well developed, in no acute distress  HEENT: normal  Neck: no JVD, carotid bruits, or masses Cardiac: RRR; no murmurs, rubs, or gallops,no edema  Respiratory:  clear to auscultation bilaterally, normal work of breathing GI: soft, nontender, nondistended, + BS MS: no deformity or atrophy  Skin: warm and dry Neuro:  Strength and sensation are intact Psych: euthymic mood, full affect  EKG:  EKG is ordered today. Personal review of the ekg ordered today shows sinus rhythm, rate 73  Recent Labs: 11/30/2015: ALT 20; BUN 12; Creat 1.48;  Hemoglobin 13.7; Platelets 206; Potassium 3.9; Sodium 137; TSH 1.38    Lipid Panel     Component Value Date/Time   CHOL 174 10/17/2013 1042   TRIG 180 (H) 10/17/2013 1042   HDL 32 (L) 10/17/2013 1042   CHOLHDL 5.4 10/17/2013 1042   VLDL 36 10/17/2013 1042   LDLCALC 106 (H) 10/17/2013 1042     Wt Readings from Last 3 Encounters:  09/13/16 265 lb 12.8 oz (120.6 kg)  06/21/16 264 lb 3.2 oz (119.8 kg)  03/14/16 260 lb 6.4 oz (118.1 kg)     Other studies Reviewed: Additional studies/ records that were reviewed today include:  SPECT 05/15/15, TTE 03/31/15 Review of the above records today demonstrates:  SPECT  The left ventricular ejection fraction is mildly decreased (45-54%).  Nuclear stress EF: 50%.  There was no ST segment deviation noted during stress.  Defect 1: Moderate sized, mild in intensity, fixed defect in the mid and basal inferior and basal inferolateral wall consistent with diaphragmatic attenuation. No ischemia noted.  This is a low risk study.  TTE - Normal LV systolic function; grade 1 diastolic dysfunction; mildLAE.  ASSESSMENT AND PLAN:  1.  Paroxysmal atrial  fibrillation: Has a chads 2 vasc score of 3 for hypertension and previous stroke. He is on Eliquis for anticoagulation. AF ablation 12/10/15. Continuing to do well with only mild episodes of palpitations. Due to his palpitations Aleshia Cartelli increase his diltiazem to 240 mg daily. We'll check a CBC and basic metabolic to evaluate for Eliquis.  This patients CHA2DS2-VASc Score and unadjusted Ischemic Stroke Rate (% per year) is equal to 3.2 % stroke rate/year from a score of 3  Above score calculated as 1 point each if present [CHF, HTN, DM, Vascular=MI/PAD/Aortic Plaque, Age if 65-74, or Male] Above score calculated as 2 points each if present [Age > 75, or Stroke/TIA/TE]  2. Hypertension: Minor elevation today, he Shabre Kreher check his blood pressures in follow-up. Continues to be elevated today. Increasing his diltiazem to 240 mg. He may require further blood pressure management.  3. Sweating: Says that he breaks out into sweats randomly. We'll check a TSH today.  Current medicines are reviewed at length with the patient today.   The patient does not have concerns regarding his medicines.  The following changes were made today:    Labs/ tests ordered today include:  Orders Placed This Encounter  Procedures  . TSH  . Basic Metabolic Panel (BMET)  . CBC w/Diff  . EKG 12-Lead     Disposition:   FU with Jeneva Schweizer 6 months.  Signed, Latiya Navia [name], MD  09/13/2016 8:24 AM     [facility] [address] [place] [address] [phone] (office) [phone] (fax)

## 2016-09-19 ENCOUNTER — Other Ambulatory Visit: Payer: Self-pay | Admitting: Family Medicine

## 2016-09-22 ENCOUNTER — Encounter: Payer: Self-pay | Admitting: Family Medicine

## 2016-09-22 ENCOUNTER — Ambulatory Visit: Payer: Medicaid Other | Attending: Family Medicine | Admitting: Family Medicine

## 2016-09-22 VITALS — BP 150/80 | HR 72 | Temp 98.6°F | Ht 73.0 in | Wt 260.8 lb

## 2016-09-22 DIAGNOSIS — D170 Benign lipomatous neoplasm of skin and subcutaneous tissue of head, face and neck: Secondary | ICD-10-CM

## 2016-09-22 DIAGNOSIS — J329 Chronic sinusitis, unspecified: Secondary | ICD-10-CM | POA: Insufficient documentation

## 2016-09-22 DIAGNOSIS — F17210 Nicotine dependence, cigarettes, uncomplicated: Secondary | ICD-10-CM | POA: Insufficient documentation

## 2016-09-22 DIAGNOSIS — J309 Allergic rhinitis, unspecified: Secondary | ICD-10-CM

## 2016-09-22 DIAGNOSIS — Z79899 Other long term (current) drug therapy: Secondary | ICD-10-CM | POA: Diagnosis not present

## 2016-09-22 DIAGNOSIS — M5441 Lumbago with sciatica, right side: Secondary | ICD-10-CM

## 2016-09-22 DIAGNOSIS — G8929 Other chronic pain: Secondary | ICD-10-CM | POA: Diagnosis not present

## 2016-09-22 DIAGNOSIS — Z0000 Encounter for general adult medical examination without abnormal findings: Secondary | ICD-10-CM | POA: Diagnosis not present

## 2016-09-22 DIAGNOSIS — I10 Essential (primary) hypertension: Secondary | ICD-10-CM | POA: Insufficient documentation

## 2016-09-22 DIAGNOSIS — Z7689 Persons encountering health services in other specified circumstances: Secondary | ICD-10-CM | POA: Insufficient documentation

## 2016-09-22 DIAGNOSIS — H1012 Acute atopic conjunctivitis, left eye: Secondary | ICD-10-CM

## 2016-09-22 MED ORDER — KETOTIFEN FUMARATE 0.025 % OP SOLN: 1.0000 [drp] | Freq: Two times a day (BID) | OPHTHALMIC | 1 refills | Status: DC

## 2016-09-22 MED ORDER — TRIAMCINOLONE ACETONIDE 55 MCG/ACT NA AERO: 2.0000 | INHALATION_SPRAY | Freq: Every day | NASAL | 12 refills | Status: DC

## 2016-09-22 MED ORDER — DIPHENHYDRAMINE HCL 25 MG PO TABS: 50.0000 mg | ORAL_TABLET | Freq: Four times a day (QID) | ORAL | 0 refills | Status: DC | PRN

## 2016-09-22 NOTE — Assessment & Plan Note (Signed)
General surgery referral placed for suspected lipoma

## 2016-09-22 NOTE — Assessment & Plan Note (Signed)
Benadryl, patient prefers  zatidor Nasacort

## 2016-09-22 NOTE — Progress Notes (Signed)
Pt would like to get his colonoscopy scheduled.

## 2016-09-22 NOTE — Patient Instructions (Addendum)
Jason Stokes was seen today for sinusitis.  Diagnoses and all orders for this visit:  Healthcare maintenance -     Ambulatory referral to Gastroenterology  Chronic right-sided low back pain with right-sided sciatica -     Ambulatory referral to Pain Clinic  Lipoma of forehead -     Ambulatory referral to General Surgery  Allergic rhinoconjunctivitis of left eye -     ketotifen (ZADITOR) 0.025 % ophthalmic solution; Place 1 drop into both eyes 2 (two) times daily. -     triamcinolone (NASACORT AQ) 55 MCG/ACT AERO nasal inhaler; Place 2 sprays into the nose daily. -     diphenhydrAMINE (BENADRYL) 25 MG tablet; Take 2 tablets (50 mg total) by mouth every 6 (six) hours as needed for allergies.   F/u in 3 months for HTN   Dr. Adrian Blackwater

## 2016-09-22 NOTE — Progress Notes (Signed)
Subjective:  Patient ID: Jason Stokes, male    DOB: [birth year]  Age: 56 y.o. MRN: [id number]  CC: Sinusitis   HPI MIHAIL PRETTYMAN has HTN he  presents for   1. Sinus pressure: x  1 week. Runny nose, congestion, eye pain in the left eye. Eye tearing. Discharge in eyes. No fever. Coughing and sneezing. Taking benadryl during the day. He has tried benadryl 25 mg for symptomatic relief and flonsase.   2. Back pain: low back pain. Midline low back pain. Pain radiates up to mid shoulders and down to toes on R ot. He has tried gabapentin and lyrica per report without improvement. He reports tylenol #3 has helped in the past but he does not want to sign a pain management contract.   3. Forehead mass: presents since he was a child. Gives history of head trauma following a fall as a child and laceration. He reports over past year mass has grown and is painful at times. It is soft.   Social History  Substance Use Topics  . Smoking status: Current Some Day Smoker    Packs/day: 1.00    Years: 29.00    Types: Cigarettes    Last attempt to quit: 08/19/2015  . Smokeless tobacco: Never Used  . Alcohol use 0.0 oz/week     Comment: "stopped drinking heavily in 2013; drank socially til 2016" (09/22/2015)     Outpatient Medications Prior to Visit  Medication Sig Dispense Refill  . atenolol (TENORMIN) 50 MG tablet TAKE 1 TABLET BY MOUTH 2 TIMES DAILY. 60 tablet 5  . carbamazepine (TEGRETOL) 200 MG tablet Take 200 mg by mouth 2 (two) times daily.    . CYMBALTA 60 MG capsule Take 60 mg by mouth daily.  1  . diltiazem (CARDIZEM CD) 240 MG 24 hr capsule Take 1 capsule (240 mg total) by mouth daily. 90 capsule 3  . ELIQUIS 5 MG TABS tablet TAKE 1 TABLET (5 MG TOTAL) BY MOUTH 2 (TWO) TIMES DAILY. 60 tablet 5  . fluticasone (FLONASE) 50 MCG/ACT nasal spray Place 2 sprays into both nostrils daily. 16 g 3  . hydrALAZINE (APRESOLINE) 50 MG tablet Take 1 tablet (50 mg total) by mouth 3 (three) times daily. 90  tablet 3  . levETIRAcetam (KEPPRA) 250 MG tablet Take 500 mg by mouth 2 (two) times daily.    Marland Kitchen omeprazole (PRILOSEC) 20 MG capsule TAKE ONE CAPSULE BY MOUTH DAILY 30 capsule 2  . PAZEO 0.7 % SOLN Place 1 drop into both eyes daily as needed.  5  . traZODone (DESYREL) 100 MG tablet Take 100-200 mg by mouth at bedtime.  2  . triamcinolone ointment (KENALOG) 0.5 % Apply 1 application topically 2 (two) times daily. 30 g 0  . trimethoprim-polymyxin b (POLYTRIM) ophthalmic solution Place 1 drop into the right eye every 4 (four) hours. 10 mL 0  . ABILIFY 15 MG tablet Take 15 mg by mouth daily.  1  . acetaminophen-codeine (TYLENOL #3) 300-30 MG tablet Take 1 tablet by mouth every 6 (six) hours as needed for moderate pain. (Patient not taking: Reported on 09/13/2016) 40 tablet 0  . amoxicillin (AMOXIL) 500 MG capsule Take 1 capsule (500 mg total) by mouth 3 (three) times daily. (Patient not taking: Reported on 09/13/2016) 30 capsule 0  . VOLTAREN 1 % GEL Apply 1 application topically 4 (four) times daily.  2   No facility-administered medications prior to visit.     ROS Review of Systems  Constitutional: Negative for chills, diaphoresis, fatigue, fever and unexpected weight change.  HENT: Positive for congestion.   Eyes: Negative for discharge and visual disturbance.  Respiratory: Negative for cough and shortness of breath.   Cardiovascular: Negative for chest pain, palpitations and leg swelling.  Gastrointestinal: Negative for abdominal pain, blood in stool, constipation, diarrhea, nausea and vomiting.  Endocrine: Negative for polydipsia, polyphagia and polyuria.  Musculoskeletal: Positive for arthralgias and back pain. Negative for gait problem, myalgias and neck pain.  Skin: Positive for rash.  Allergic/Immunologic: Negative for immunocompromised state.  Hematological: Negative for adenopathy. Does not bruise/bleed easily.  Psychiatric/Behavioral: Negative for dysphoric mood, sleep disturbance  and suicidal ideas. The patient is not nervous/anxious.     Objective:  BP (!) 150/80 (BP Location: Left Arm, Patient Position: Sitting, Cuff Size: Large)   Pulse 72   Temp 98.6 F (37 C) (Oral)   Ht 6\' 1"  (1.854 m)   Wt 260 lb 12.8 oz (118.3 kg)   SpO2 100%   BMI 34.41 kg/m   BP/Weight 09/22/2016 09/13/2016 14/10/8183  Systolic BP 631 497 026  Diastolic BP 80 90 378  Wt. (Lbs) 260.8 265.8 264.2  BMI 34.41 35.07 34.86    Physical Exam  Constitutional: He appears well-developed and well-nourished. No distress.  HENT:  Head: Normocephalic and atraumatic.    Nose: Mucosal edema (on R ) present.  Eyes: Conjunctivae, EOM and lids are normal. Pupils are equal, round, and reactive to light. Lids are everted and swept, no foreign bodies found.    Neck: Normal range of motion. Neck supple.  Cardiovascular: Normal rate, regular rhythm, normal heart sounds and intact distal pulses.   Pulmonary/Chest: Effort normal and breath sounds normal.  Musculoskeletal: He exhibits no edema.       Hands: Neurological: He is alert.  Skin: Skin is warm and dry. No rash noted. No erythema.  Psychiatric: He has a normal mood and affect.     Assessment & Plan:  Cha was seen today for sinusitis.  Diagnoses and all orders for this visit:  Healthcare maintenance -     Ambulatory referral to Gastroenterology  Chronic right-sided low back pain with right-sided sciatica -     Ambulatory referral to Pain Clinic  Lipoma of forehead -     Ambulatory referral to General Surgery  Allergic rhinoconjunctivitis of left eye -     ketotifen (ZADITOR) 0.025 % ophthalmic solution; Place 1 drop into both eyes 2 (two) times daily. -     triamcinolone (NASACORT AQ) 55 MCG/ACT AERO nasal inhaler; Place 2 sprays into the nose daily. -     diphenhydrAMINE (BENADRYL) 25 MG tablet; Take 2 tablets (50 mg total) by mouth every 6 (six) hours as needed for allergies.   There are no diagnoses linked to this  encounter.  No orders of the defined types were placed in this encounter.   Follow-up: Return in about 3 months (around 12/23/2016) for HTN .   Boykin Nearing MD

## 2016-10-24 ENCOUNTER — Other Ambulatory Visit: Payer: Self-pay | Admitting: Family Medicine

## 2016-10-24 DIAGNOSIS — J309 Allergic rhinitis, unspecified: Principal | ICD-10-CM

## 2016-10-24 DIAGNOSIS — H1012 Acute atopic conjunctivitis, left eye: Secondary | ICD-10-CM

## 2016-11-02 ENCOUNTER — Ambulatory Visit (INDEPENDENT_AMBULATORY_CARE_PROVIDER_SITE_OTHER): Payer: Medicaid Other | Admitting: Family Medicine

## 2016-11-02 DIAGNOSIS — D170 Benign lipomatous neoplasm of skin and subcutaneous tissue of head, face and neck: Secondary | ICD-10-CM | POA: Diagnosis not present

## 2016-11-02 NOTE — Patient Instructions (Addendum)
It was nice to meet you today.   I think that the area on your forehead is a lipoma. This is a benign (non cancerous lesion). Since you would like this removed I have referred you to Plastic Surgery. My office will call you once we have made an appointment.   Lipoma A lipoma is a noncancerous (benign) tumor that is made up of fat cells. This is a very common type of soft-tissue growth. Lipomas are usually found under the skin (subcutaneous). They may occur in any tissue of the body that contains fat. Common areas for lipomas to appear include the back, shoulders, buttocks, and thighs. Lipomas grow slowly, and they are usually painless. Most lipomas do not cause problems and do not require treatment. What are the causes? The cause of this condition is not known. What increases the risk? This condition is more likely to develop in:  People who are 93-49 years old.  People who have a family history of lipomas. What are the signs or symptoms? A lipoma usually appears as a small, round bump under the skin. It may feel soft or rubbery, but the firmness can vary. Most lipomas are not painful. However, a lipoma may become painful if it is located in an area where it pushes on nerves. How is this diagnosed? A lipoma can usually be diagnosed with a physical exam. You may also have tests to confirm the diagnosis and to rule out other conditions. Tests may include:  Imaging tests, such as a CT scan or MRI.  Removal of a tissue sample to be looked at under a microscope (biopsy). How is this treated? Treatment is not needed for small lipomas that are not causing problems. If a lipoma continues to get bigger or it causes problems, removal is often the best option. Lipomas can also be removed to improve appearance. Removal of a lipoma is usually done with a surgery in which the fatty cells and the surrounding capsule are removed. Most often, a medicine that numbs the area (local anesthetic) is used for this  procedure. Follow these instructions at home:  Keep all follow-up visits as directed by your health care provider. This is important. Contact a health care provider if:  Your lipoma becomes larger or hard.  Your lipoma becomes painful, red, or increasingly swollen. These could be signs of infection or a more serious condition. This information is not intended to replace advice given to you by your health care provider. Make sure you discuss any questions you have with your health care provider. Document Released: 06/24/2002 Document Revised: 12/10/2015 Document Reviewed: 06/30/2014 Elsevier Interactive Patient Education  2017 [employer].

## 2016-11-02 NOTE — Progress Notes (Signed)
   Subjective:    Patient ID: Jason Stokes, male    DOB: [birth year], 56 y.o.   MRN: [id number]  HPI 56 y/o male presents for evaluation of "mass on forehead".  Forehead Mass Patient states presents since childhood, had an injury as a child with stitches in same area, Has been enlarging over the past 4 years. Now painful for the past month (present at site of lesion and spreads outwards).   Evaluated by PCP on 09/22/16. Thought to be lipoma. Per PCP notes patient was referred to General Surgery.    Review of Systems No nausea or vomiting, no headaches    Objective:   Physical Exam BP (!) 136/96   Pulse 85   Temp 98.8 F (37.1 C) (Oral)   Ht 6\' 1"  (1.854 m)   Wt 262 lb 3.2 oz (118.9 kg)   SpO2 98%   BMI 34.59 kg/m    Skin:soft, mobile mass, 5 cm by 4 cm over right anterior forehead, no drainage, no warmth, no redness, patient reports posterior eye pain with palpation of lesion.         Assessment & Plan:  Lipoma of forehead 5 cm by 4 cm lesion of right anterior forehead most consistent with lipoma. Informed patient that I was not comfortable removing in office today. -referral made to plastic surgery for biopsy/removal.

## 2016-11-02 NOTE — Assessment & Plan Note (Signed)
5 cm by 4 cm lesion of right anterior forehead most consistent with lipoma. Informed patient that I was not comfortable removing in office today. -referral made to plastic surgery for biopsy/removal.

## 2016-11-03 ENCOUNTER — Encounter: Payer: Self-pay | Admitting: Family Medicine

## 2016-11-21 ENCOUNTER — Ambulatory Visit (INDEPENDENT_AMBULATORY_CARE_PROVIDER_SITE_OTHER): Payer: Medicaid Other | Admitting: Orthopaedic Surgery

## 2016-11-21 DIAGNOSIS — G8929 Other chronic pain: Secondary | ICD-10-CM | POA: Diagnosis not present

## 2016-11-21 DIAGNOSIS — M25561 Pain in right knee: Secondary | ICD-10-CM | POA: Diagnosis not present

## 2016-11-21 MED ORDER — LIDOCAINE HCL 1 % IJ SOLN
3.0000 mL | INTRAMUSCULAR | Status: AC | PRN
  Administered 2016-11-21: 3 mL

## 2016-11-21 MED ORDER — METHYLPREDNISOLONE ACETATE 40 MG/ML IJ SUSP
40.0000 mg | INTRAMUSCULAR | Status: AC | PRN
  Administered 2016-11-21: 40 mg via INTRA_ARTICULAR

## 2016-11-21 NOTE — Progress Notes (Signed)
Office Visit Note   [name]           Date of Birth: 1960-12-31           MRN: [id number] Visit Date: 11/21/2016              Requested by: Boykin Nearing, MD [address] [place], [place] [address] PCP: Boykin Nearing, MD   Assessment & Plan: Visit Diagnoses:  1. Chronic pain of right knee     Plan: Per his wishes I did prep his right knee with better alcohol and provided a steroid injection with 3 mL lidocaine and 1 mL Depo-Medrol he tolerated this well. He has wait at least 3-4 months between steroid injections.  Follow-Up Instructions: Return if symptoms worsen or fail to improve.   Orders:  Orders Placed This Encounter  Procedures  . Large Joint Injection/Arthrocentesis   No orders of the defined types were placed in this encounter.     Procedures: Large Joint Inj Date/Time: 11/21/2016 2:03 PM Performed by: Mcarthur Rossetti Authorized by: Mcarthur Rossetti   Location:  Knee Site:  R knee Ultrasound Guidance: No   Fluoroscopic Guidance: No   Arthrogram: No   Medications:  3 mL lidocaine 1 %; 40 mg methylPREDNISolone acetate 40 MG/ML     Clinical Data: No additional findings.   Subjective: No chief complaint on file. The patient is well-known to me. He has known moderate arthritis of his right knee. He has had a Hyalgan chronic acid injection back in February to is doing great injection. He is heading down to [place] soon to see his grandkids and like to have a steroid injection today. He's had no other changes in medical status. His knee is doing well. This is his right knee.  HPI  Review of Systems Him he denies any chest pain, shortness of breath, headache, fever, chills, nausea, vomiting.  Objective: Vital Signs: There were no vitals taken for this visit.  Physical Exam He is alert and oriented 3 and in no acute distress Ortho Exam Examination of his right knee shows a mild varus deformity. Good range motion of  the knee and it is ligamentous is stable. There is a mild effusion. Specialty Comments:  No specialty comments available.  Imaging: No results found.   PMFS History: Patient Active Problem List   Diagnosis Date Noted  . Lipoma of forehead 09/22/2016  . Trigger ring finger of right hand 06/22/2016  . Paroxysmal atrial fibrillation (HCC)   . AF (atrial fibrillation) ([place]) 12/10/2015  . Allergic rhinoconjunctivitis of left eye 07/10/2015  . Eczema 07/10/2015  . Sweating 07/10/2015  . History of CVA (cerebrovascular accident) 05/04/2015  . PAF (paroxysmal atrial fibrillation) ([facility]) 03/27/2015  . Tear of medial meniscus of right knee 03/18/2015  . Chronic pain of right knee 03/12/2015  . Other and unspecified hyperlipidemia 11/25/2013  . Nasal congestion 11/25/2013  . Sinusitis, chronic 05/09/2013  . Chronic low back pain 10/12/2012  . Sciatica neuralgia 10/12/2012  . Hypertension 10/12/2012  . Depression 10/12/2012  . Insomnia 10/12/2012   Past Medical History:  Diagnosis Date  . Anxiety   . Bipolar disorder ([facility])   . Chronic lower back pain   . Depression   . GERD (gastroesophageal reflux disease)   . History of alcohol abuse   . History of nuclear stress test    Myoview 10/16: EF 50%, diaphragmatic attenuation, no ischemia, low risk  . Hypertension   . Migraine 2012-2014  .  PAF (paroxysmal atrial fibrillation) ([facility]) 03/27/2015   a. Myoview neg for ischemia >> Flecainide started 10/16 >> FU ETT   . Schizophrenia ([place])   . Sciatica neuralgia   . Small vessel disease    Right basal ganglia stroke  . Stroke [facility]) "between 2012-2014"   residual "AF" (09/22/2015)    Family History  Problem Relation Age of Onset  . Heart disease Father   . Schizophrenia Sister     Past Surgical History:  Procedure Laterality Date  . ATRIAL FIBRILLATION ABLATION  09/22/2015  . CYST EXCISION  1996-97   surgery back of head   . ELECTROPHYSIOLOGIC STUDY N/A 09/22/2015   Procedure: Atrial  Fibrillation Ablation;  Surgeon: Will [name], MD;  Location: [facility];  Service: Cardiovascular;  Laterality: N/A;  . ELECTROPHYSIOLOGIC STUDY N/A 12/10/2015   Procedure: Atrial Fibrillation Ablation;  Surgeon: Will [name], MD;  Location: [facility];  Service: Cardiovascular;  Laterality: N/A;  . ELECTROPHYSIOLOGIC STUDY N/A 12/11/2015   Procedure: Cardioversion;  Surgeon: Will [name], MD;  Location: [facility];  Service: Cardiovascular;  Laterality: N/A;  . GANGLION CYST EXCISION Left   . INTERCOSTAL NERVE BLOCK  2005  . KNEE ARTHROSCOPY Right 2016   Social History   Occupational History  .      disabled   Social History Main Topics  . Smoking status: Current Some Day Smoker    Packs/day: 1.00    Years: 29.00    Types: Cigarettes    Last attempt to quit: 08/19/2015  . Smokeless tobacco: Never Used  . Alcohol use 0.0 oz/week     Comment: "stopped drinking heavily in 2013; drank socially til 2016" (09/22/2015)  . Drug use: Yes    Types: Marijuana     Comment: 09/22/2015 "marijuana for pain when I can get it"  . Sexual activity: Not Currently

## 2016-11-30 ENCOUNTER — Encounter: Payer: Self-pay | Admitting: Family Medicine

## 2016-12-01 ENCOUNTER — Telehealth: Payer: Self-pay | Admitting: Pharmacist

## 2016-12-01 NOTE — Telephone Encounter (Signed)
Jason Stokes is on Eliquis for afib with a history of CVA. Generally we recommend against holding for skin procedures unless bleeding risk is deemed high. Given his history of CVA if holding prior to procedure is necessary would recommend holding 24 hours prior to procedure and resuming ASAP after.

## 2016-12-01 NOTE — Telephone Encounter (Signed)
-----   Message from Jason [name], MD sent at 12/01/2016  2:24 PM EDT ----- I do not think any further workup is necessary. He has been stable without cardiac symptoms. I Jason forward this note to our pharmacists who have a protocol for stopping anticoagulants in patient with AF around surgical procedures. Thanks!  Jason camnitz ----- Message ----- From: Irene Limbo, MD Sent: 12/01/2016  10:52 AM To: Constance Haw, MD  Hello  I am a plastic surgeon that saw this patient for consult for removal lipoma forehead. This would be a general anesthetic less than an hour. He did not recall your name but said he had appt coming up in June. I was writing to enquire when I can hold the Eliquis prior to skin/soft tissue procedure. Do we need to bridge? When do you recommend resuming? Any further work up recommended prior to surgery?  This procedure Jason not occur prior to your June 5 follow up appt with him.  Thank you in advance,  Irene Limbo MD  Cell [phone] Office [phone] Pager [phone] pin [phone]

## 2016-12-20 ENCOUNTER — Ambulatory Visit (INDEPENDENT_AMBULATORY_CARE_PROVIDER_SITE_OTHER): Payer: Medicaid Other | Admitting: Cardiology

## 2016-12-20 ENCOUNTER — Ambulatory Visit (INDEPENDENT_AMBULATORY_CARE_PROVIDER_SITE_OTHER): Payer: Medicaid Other

## 2016-12-20 VITALS — BP 160/102 | HR 64 | Ht 73.0 in | Wt 255.2 lb

## 2016-12-20 DIAGNOSIS — R55 Syncope and collapse: Secondary | ICD-10-CM

## 2016-12-20 DIAGNOSIS — I480 Paroxysmal atrial fibrillation: Secondary | ICD-10-CM

## 2016-12-20 MED ORDER — HYDRALAZINE HCL 100 MG PO TABS: 100.0000 mg | ORAL_TABLET | Freq: Three times a day (TID) | ORAL | 3 refills | Status: DC

## 2016-12-20 MED ORDER — CARVEDILOL 12.5 MG PO TABS: 12.5000 mg | ORAL_TABLET | Freq: Two times a day (BID) | ORAL | 3 refills | Status: DC

## 2016-12-20 NOTE — Progress Notes (Signed)
Electrophysiology Office Note   Date:  12/20/2016   ID:  DRAY DENTE, DOB 1961/04/23, MRN [id number]  PCP:  Boykin Nearing, MD  Cardiologist:  [name] Primary Electrophysiologist: Yamilet Mcfayden [name], MD    Chief Complaint  Patient presents with  . Atrial Fibrillation     History of Present Illness: Jason Stokes is a 56 y.o. male who presents today for electrophysiology evaluation.   He is a history of paroxysmal atrial fibrillation, hypertension and remote CVA. He presented to the emergency room with atrial fibrillation and RVR. He was started on Eliquis L diltiazem at that time. He did not follow-up with cardiology until 03/27/15 when he complained of palpitations. He had AF ablation performed on 09/22/15. Unfortunately during his procedure, it was noted that he had a clot in his left atrium and the procedure was therefore canceled. Had repeat ablation 12/10/15.  Today, denies symptoms of palpitations, chest pain, shortness of breath, orthopnea, PND, lower extremity edema, claudication, dizziness, presyncope, syncope, bleeding, or neurologic sequela. The patient is tolerating medications without difficulties and is otherwise without complaint today. He has been feeling well. He did have an episode of syncope. He was driving with his family to [place]. He got out of the car, felt hot and sweaty, and passed out. He had been in and out of the car throughout his drive. He says that he felt his heart pounding after his episode of syncope. Once he cooled down, he felt well and had no further episodes. He has not had any prior syncope. He does say today that his blood pressure has been elevated quite often. He also says that when he was in a plastic surgeon's office, he was in atrial fibrillation per surgery. No EKG was done at that time.    Past Medical History:  Diagnosis Date  . Anxiety   . Bipolar disorder ([place])   . Chronic lower back pain   . Depression   . GERD  (gastroesophageal reflux disease)   . History of alcohol abuse   . History of nuclear stress test    Myoview 10/16: EF 50%, diaphragmatic attenuation, no ischemia, low risk  . Hypertension   . Migraine 2012-2014  . PAF (paroxysmal atrial fibrillation) ([place]) 03/27/2015   a. Myoview neg for ischemia >> Flecainide started 10/16 >> FU ETT   . Schizophrenia ([place])   . Sciatica neuralgia   . Small vessel disease    Right basal ganglia stroke  . Stroke [facility]) "between 2012-2014"   residual "AF" (09/22/2015)   Past Surgical History:  Procedure Laterality Date  . ATRIAL FIBRILLATION ABLATION  09/22/2015  . CYST EXCISION  1996-97   surgery back of head   . ELECTROPHYSIOLOGIC STUDY N/A 09/22/2015   Procedure: Atrial Fibrillation Ablation;  Surgeon: Westlynn Fifer [name], MD;  Location: [place] [facility];  Service: Cardiovascular;  Laterality: N/A;  . ELECTROPHYSIOLOGIC STUDY N/A 12/10/2015   Procedure: Atrial Fibrillation Ablation;  Surgeon: Jusitn Salsgiver [name], MD;  Location: [place] [facility];  Service: Cardiovascular;  Laterality: N/A;  . ELECTROPHYSIOLOGIC STUDY N/A 12/11/2015   Procedure: Cardioversion;  Surgeon: Assia Meanor [name], MD;  Location: [place] [facility];  Service: Cardiovascular;  Laterality: N/A;  . GANGLION CYST EXCISION Left   . INTERCOSTAL NERVE BLOCK  2005  . KNEE ARTHROSCOPY Right 2016     Current Outpatient Prescriptions  Medication Sig Dispense Refill  . carbamazepine (TEGRETOL) 200 MG tablet Take 200 mg by mouth 2 (two) times daily.    Marland Kitchen  CYMBALTA 60 MG capsule Take 60 mg by mouth daily.  1  . diphenhydrAMINE (BENADRYL) 25 mg capsule TAKE 2 TABLETS (50 MG TOTAL) BY MOUTH EVERY 6 (SIX) HOURS AS NEEDED FOR ALLERGIES. 60 capsule 0  . ELIQUIS 5 MG TABS tablet TAKE 1 TABLET (5 MG TOTAL) BY MOUTH 2 (TWO) TIMES DAILY. 60 tablet 5  . fluticasone (FLONASE) 50 MCG/ACT nasal spray Place 2 sprays into both nostrils daily. 16 g 3  . ketotifen (ZADITOR) 0.025 % ophthalmic solution Place  1 drop into both eyes 2 (two) times daily. 10 mL 1  . levETIRAcetam (KEPPRA) 250 MG tablet Take 500 mg by mouth 2 (two) times daily.    Marland Kitchen omeprazole (PRILOSEC) 20 MG capsule TAKE ONE CAPSULE BY MOUTH DAILY 30 capsule 2  . traZODone (DESYREL) 100 MG tablet Take 100-200 mg by mouth at bedtime.  2  . triamcinolone (NASACORT AQ) 55 MCG/ACT AERO nasal inhaler Place 2 sprays into the nose daily. 1 Inhaler 12  . triamcinolone ointment (KENALOG) 0.5 % Apply 1 application topically 2 (two) times daily. 30 g 0  . VOLTAREN 1 % GEL Apply 1 application topically 4 (four) times daily.  2  . carvedilol (COREG) 12.5 MG tablet Take 1 tablet (12.5 mg total) by mouth 2 (two) times daily. 60 tablet 3  . diltiazem (CARDIZEM CD) 240 MG 24 hr capsule Take 1 capsule (240 mg total) by mouth daily. 90 capsule 3  . hydrALAZINE (APRESOLINE) 100 MG tablet Take 1 tablet (100 mg total) by mouth 3 (three) times daily. 270 tablet 3   No current facility-administered medications for this visit.     Allergies:   Lisinopril   Social History:  The patient  reports that he has been smoking Cigarettes.  He has a 29.00 pack-year smoking history. He has never used smokeless tobacco. He reports that he drinks alcohol. He reports that he uses drugs, including Marijuana.   Family History:  The patient's family history includes Heart disease in his father; Schizophrenia in his sister.    ROS:  Please see the history of present illness.   Otherwise, review of systems is positive for Leg pain, back pain, difficulty urinating, balance problems, passing out.   All other systems are reviewed and negative.     PHYSICAL EXAM: VS:  BP (!) 160/102 (BP Location: Left Arm)   Pulse 64   Ht 6\' 1"  (1.854 m)   Wt 255 lb 3.2 oz (115.8 kg)   BMI 33.67 kg/m  , BMI Body mass index is 33.67 kg/m. GEN: Well nourished, well developed, in no acute distress  HEENT: normal  Neck: no JVD, carotid bruits, or masses Cardiac: RRR; no murmurs, rubs, or  gallops,no edema  Respiratory:  clear to auscultation bilaterally, normal work of breathing GI: soft, nontender, nondistended, + BS MS: no deformity or atrophy  Skin: warm and dry Neuro:  Strength and sensation are intact Psych: euthymic mood, full affect  EKG:  EKG is ordered today. Personal review of the ekg ordered shows sinus rhythm, rate 64   Recent Labs: 09/13/2016: BUN 13; Creatinine, Ser 1.57; Platelets 166; Potassium 3.8; Sodium 139; TSH 1.010    Lipid Panel     Component Value Date/Time   CHOL 174 10/17/2013 1042   TRIG 180 (H) 10/17/2013 1042   HDL 32 (L) 10/17/2013 1042   CHOLHDL 5.4 10/17/2013 1042   VLDL 36 10/17/2013 1042   LDLCALC 106 (H) 10/17/2013 1042     Wt Readings from  Last 3 Encounters:  12/20/16 255 lb 3.2 oz (115.8 kg)  11/02/16 262 lb 3.2 oz (118.9 kg)  09/22/16 260 lb 12.8 oz (118.3 kg)     Other studies Reviewed: Additional studies/ records that were reviewed today include:  SPECT 05/15/15, TTE 03/31/15 Review of the above records today demonstrates:  SPECT  The left ventricular ejection fraction is mildly decreased (45-54%).  Nuclear stress EF: 50%.  There was no ST segment deviation noted during stress.  Defect 1: Moderate sized, mild in intensity, fixed defect in the mid and basal inferior and basal inferolateral wall consistent with diaphragmatic attenuation. No ischemia noted.  This is a low risk study.  TTE - Normal LV systolic function; grade 1 diastolic dysfunction; mildLAE.  ASSESSMENT AND PLAN:  1.  Paroxysmal atrial fibrillation: On Eliquis for anticoagulation. Has had only minor episodes of palpitations. No changes at this time.  This patients CHA2DS2-VASc Score and unadjusted Ischemic Stroke Rate (% per year) is equal to 3.2 % stroke rate/year from a score of 3  Above score calculated as 1 point each if present [CHF, HTN, DM, Vascular=MI/PAD/Aortic Plaque, Age if 65-74, or Male] Above score calculated as 2 points  each if present [Age > 75, or Stroke/TIA/TE]  2. Hypertension: Elevated today. We'll stop his atenolol and put him on carvedilol 12.5 mg twice a day. We'll also increase his hydralazine to 100 mg.  3. Syncope: Could be a vagal reaction as well as orthostasis after getting out of the car. That being said, it is worrisome for arrhythmogenic cause. We'll place a 30 day monitor. No driving per [facility] law for 6 months.  Current medicines are reviewed at length with the patient today.   The patient does not have concerns regarding his medicines.  The following changes were made today:  Stop atenolol, start Coreg, increase hydralazine  Labs/ tests ordered today include:  Orders Placed This Encounter  Procedures  . Cardiac event monitor     Disposition:   FU with Seleni Meller 3 months.  Signed, Turhan Chill [name], MD  12/20/2016 8:25 AM     [facility] [address] [place] [address] [phone] (office) [phone] (fax)

## 2016-12-20 NOTE — Addendum Note (Signed)
Addended by: Stanton Kidney on: 12/20/2016 08:36 AM   Modules accepted: Orders

## 2016-12-20 NOTE — Patient Instructions (Addendum)
Medication Instructions:   Your physician has recommended you make the following change in your medication:  1) STOP Atenolol 2) START Carvedilol 12.5 mg twice a day 3) INCREASE Hydralazine to 100 mg three times a day  - If you need a refill on your cardiac medications before your next appointment, please call your pharmacy.   Labwork:  None ordered  Testing/Procedures: Your physician has recommended that you wear an event monitor. Event monitors are medical devices that record the heart's electrical activity. Doctors most often [place] these monitors to diagnose arrhythmias. Arrhythmias are problems with the speed or rhythm of the heartbeat. The monitor is a small, portable device. You can wear one while you do your normal daily activities. This is usually used to diagnose what is causing palpitations/syncope (passing out).  Follow-Up:  Your physician recommends that you schedule a follow-up appointment in: 3 months with Dr. Curt Bears.  Thank you for choosing [facility]!!   Trinidad Curet, RN [phone]  Any Other Special Instructions Will Be Listed Below (If Applicable).  Cardiac Event Monitoring A cardiac event monitor is a small recording device that is used to detect abnormal heart rhythms (arrhythmias). The monitor is used to record your heart rhythm when you have symptoms, such as:  Fast heartbeats (palpitations), such as heart racing or fluttering.  Dizziness.  Fainting or light-headedness.  Unexplained weakness.  Some monitors are wired to electrodes placed on your chest. Electrodes are flat, sticky disks that attach to your skin. Other monitors may be hand-held or worn on the wrist. The monitor can be worn for up to 30 days. If the monitor is attached to your chest, a technician will prepare your chest for the electrode placement and show you how to work the monitor. Take time to practice using the monitor before you leave the office. Make sure you understand how to  send the information from the monitor to your health care provider. In some cases, you may need to use a landline telephone instead of a cell phone. What are the risks? Generally, this device is safe to use, but it possible that the skin under the electrodes will become irritated. How to use your cardiac event monitor  Wear your monitor at all times, except when you are in water: ? Do not let the monitor get wet. ? Take the monitor off when you bathe. Do not swim or use a hot tub with it on.  Keep your skin clean. Do not put body lotion or moisturizer on your chest.  Change the electrodes as told by your health care provider or any time they stop sticking to your skin. You may need to use medical tape to keep them on.  Try to put the electrodes in slightly different places on your chest to help prevent skin irritation. They must remain in the area under your left breast and in the upper right section of your chest.  Make sure the monitor is safely clipped to your clothing or in a location close to your body that your health care provider recommends.  Press the button to record as soon as you feel heart-related symptoms, such as: ? Dizziness. ? Weakness. ? Light-headedness. ? Palpitations. ? Thumping or pounding in your chest. ? Shortness of breath. ? Unexplained weakness.  Keep a diary of your activities, such as walking, doing chores, and taking medicine. It is very important to note what you were doing when you pushed the button to record your symptoms. This will help  your health care provider determine what might be contributing to your symptoms.  Send the recorded information as recommended by your health care provider. It may take some time for your health care provider to process the results.  Change the batteries as told by your health care provider.  Keep electronic devices away from your monitor. This includes: ? Tablets. ? MP3 players. ? Cell phones.  While wearing your  monitor you should avoid: ? Electric blankets. ? Armed forces operational officer. ? Electric toothbrushes. ? Microwave ovens. ? Magnets. ? Metal detectors. Get help right away if:  You have chest pain.  You have extreme difficulty breathing or shortness of breath.  You develop a very fast heartbeat that persists.  You develop dizziness that does not go away.  You faint or constantly feel like you are about to faint. Summary  A cardiac event monitor is a small recording device that is used to help detect abnormal heart rhythms (arrhythmias).  The monitor is used to record your heart rhythm when you have heart-related symptoms.  Make sure you understand how to send the information from the monitor to your health care provider.  It is important to press the button on the monitor when you have any heart-related symptoms.  Keep a diary of your activities, such as walking, doing chores, and taking medicine. It is very important to note what you were doing when you pushed the button to record your symptoms. This will help your health care provider learn what might be causing your symptoms. This information is not intended to replace advice given to you by your health care provider. Make sure you discuss any questions you have with your health care provider. Document Released: 04/12/2008 Document Revised: 06/18/2016 Document Reviewed: 06/18/2016 Elsevier Interactive Patient Education  2017 [employer].

## 2016-12-26 ENCOUNTER — Telehealth: Payer: Self-pay

## 2016-12-26 ENCOUNTER — Telehealth: Payer: Self-pay | Admitting: *Deleted

## 2016-12-26 DIAGNOSIS — I10 Essential (primary) hypertension: Secondary | ICD-10-CM

## 2016-12-26 MED ORDER — DILTIAZEM HCL ER COATED BEADS 240 MG PO CP24: 240.0000 mg | ORAL_CAPSULE | Freq: Every day | ORAL | 3 refills | Status: DC

## 2016-12-26 NOTE — Telephone Encounter (Signed)
Received call from [employer] with critical EKG reading.  Afib rate of 100-110.  1st documented episode of Afib.   Follow up strip, still afib.    Called patient-reports he can tell he is in Afib, starting last night.   Reports intermittent left breast pain, better with rest.  Denies palpitations, reports lightheaded when standing.  Patient reports he has not taken his cardizem since Thursday as we he was out of medication and pharmacy was suppose to call our office to renew it.  Unable to take BP and HR at current, wife not home.  Will speak to Dr. Curt Bears in office for recommendations.     Spoke to Dr. Gwinda Maine changes at this time other than begin taking Cardizem (that patient has not taken since Thursday).  Rx sent to pharmacy-patient aware to pick up and restart today.  Advised to call with further questions or concerns.

## 2016-12-26 NOTE — Telephone Encounter (Signed)
Received event monitor report that says Pt is in A-fib with a HR 110. Called patient to check on him. Pt c/o feeling light headed all day and he is experiencing chest soreness on and off. Advised patient that if the Chest Soreness becomes severe then to seek medical care in the ER to be evaluated. Reviewed the tracing with Dr. Lovena Le who says Pt needs to be  followed up with Dr. Curt Bears in 1-2 weeks. Will route to Dr. Curt Bears covering nurse and the scheduler to get pt scheduled to see Dr. Curt Bears. Pt thanked me for my call.

## 2016-12-27 NOTE — Telephone Encounter (Signed)
Informed patient Dr. Curt Bears would like to follow up after the event monitor is complete.  Scheduled pt to f/u on 7/19 to discuss monitor finding. Pt understands to call the office with symptomatic issues. Patient verbalized understanding and agreeable to plan.

## 2017-01-02 ENCOUNTER — Encounter (HOSPITAL_BASED_OUTPATIENT_CLINIC_OR_DEPARTMENT_OTHER): Payer: Self-pay | Admitting: *Deleted

## 2017-01-03 ENCOUNTER — Encounter (HOSPITAL_BASED_OUTPATIENT_CLINIC_OR_DEPARTMENT_OTHER): Payer: Self-pay | Admitting: *Deleted

## 2017-01-03 ENCOUNTER — Encounter (HOSPITAL_BASED_OUTPATIENT_CLINIC_OR_DEPARTMENT_OTHER)
Admission: RE | Admit: 2017-01-03 | Discharge: 2017-01-03 | Disposition: A | Discharge status: Home / Self Care | Payer: Medicaid Other | Source: Ambulatory Visit | Attending: Plastic Surgery | Admitting: Plastic Surgery

## 2017-01-03 DIAGNOSIS — Z79899 Other long term (current) drug therapy: Secondary | ICD-10-CM | POA: Diagnosis not present

## 2017-01-03 DIAGNOSIS — K219 Gastro-esophageal reflux disease without esophagitis: Secondary | ICD-10-CM | POA: Diagnosis not present

## 2017-01-03 DIAGNOSIS — Z8673 Personal history of transient ischemic attack (TIA), and cerebral infarction without residual deficits: Secondary | ICD-10-CM | POA: Diagnosis not present

## 2017-01-03 DIAGNOSIS — Z7902 Long term (current) use of antithrombotics/antiplatelets: Secondary | ICD-10-CM | POA: Diagnosis not present

## 2017-01-03 DIAGNOSIS — I4891 Unspecified atrial fibrillation: Secondary | ICD-10-CM | POA: Diagnosis not present

## 2017-01-03 DIAGNOSIS — D170 Benign lipomatous neoplasm of skin and subcutaneous tissue of head, face and neck: Secondary | ICD-10-CM | POA: Diagnosis present

## 2017-01-03 DIAGNOSIS — F209 Schizophrenia, unspecified: Secondary | ICD-10-CM | POA: Diagnosis not present

## 2017-01-03 DIAGNOSIS — F17210 Nicotine dependence, cigarettes, uncomplicated: Secondary | ICD-10-CM | POA: Diagnosis not present

## 2017-01-03 DIAGNOSIS — F319 Bipolar disorder, unspecified: Secondary | ICD-10-CM | POA: Diagnosis not present

## 2017-01-03 LAB — BASIC METABOLIC PANEL
Anion gap: 7 (ref 5–15)
BUN: 14 mg/dL (ref 6–20)
CO2: 23 mmol/L (ref 22–32)
Calcium: 9.3 mg/dL (ref 8.9–10.3)
Chloride: 106 mmol/L (ref 101–111)
Creatinine, Ser: 1.24 mg/dL (ref 0.61–1.24)
GFR calc Af Amer: >60 mL/min (ref >60)
GFR calc non Af Amer: >60 mL/min (ref >60)
Glucose, Bld: 122 mg/dL — ABNORMAL HIGH (ref 65–99)
Potassium: 3.9 mmol/L (ref 3.5–5.1)
Sodium: 136 mmol/L (ref 135–145)

## 2017-01-03 LAB — CBC WITH DIFFERENTIAL/PLATELET
Basophils Absolute: 0 10*3/uL (ref 0.0–0.1)
Basophils Relative: 0 %
Eosinophils Absolute: 0.2 10*3/uL (ref 0.0–0.7)
Eosinophils Relative: 4 %
HCT: 41.5 % (ref 39.0–52.0)
Hemoglobin: 13.3 g/dL (ref 13.0–17.0)
Lymphocytes Relative: 28 %
Lymphs Abs: 1.4 10*3/uL (ref 0.7–4.0)
MCH: 25.4 pg — ABNORMAL LOW (ref 26.0–34.0)
MCHC: 32 g/dL (ref 30.0–36.0)
MCV: 79.3 fL (ref 78.0–100.0)
Monocytes Absolute: 0.4 10*3/uL (ref 0.1–1.0)
Monocytes Relative: 8 %
Neutro Abs: 3.1 10*3/uL (ref 1.7–7.7)
Neutrophils Relative %: 60 %
Platelets: 176 10*3/uL (ref 150–400)
RBC: 5.23 MIL/uL (ref 4.22–5.81)
RDW: 14.7 % (ref 11.5–15.5)
WBC: 5.1 10*3/uL (ref 4.0–10.5)

## 2017-01-03 NOTE — Progress Notes (Signed)
Chart reviewed by Dr Orene Desanctis, [place] for surgery, will place on monitor in preop to check heart rhythm.

## 2017-01-06 ENCOUNTER — Encounter (HOSPITAL_BASED_OUTPATIENT_CLINIC_OR_DEPARTMENT_OTHER)
Admission: RE | Disposition: A | Discharge status: Home / Self Care | Payer: Self-pay | Source: Ambulatory Visit | Attending: Plastic Surgery

## 2017-01-06 ENCOUNTER — Encounter (HOSPITAL_BASED_OUTPATIENT_CLINIC_OR_DEPARTMENT_OTHER): Payer: Self-pay | Admitting: *Deleted

## 2017-01-06 ENCOUNTER — Ambulatory Visit (HOSPITAL_BASED_OUTPATIENT_CLINIC_OR_DEPARTMENT_OTHER)
Admission: RE | Admit: 2017-01-06 | Discharge: 2017-01-06 | Disposition: A | Discharge status: Home / Self Care | Payer: Medicaid Other | Source: Ambulatory Visit | Attending: Plastic Surgery | Admitting: Plastic Surgery

## 2017-01-06 ENCOUNTER — Ambulatory Visit (HOSPITAL_BASED_OUTPATIENT_CLINIC_OR_DEPARTMENT_OTHER): Payer: Medicaid Other | Admitting: Anesthesiology

## 2017-01-06 DIAGNOSIS — Z8673 Personal history of transient ischemic attack (TIA), and cerebral infarction without residual deficits: Secondary | ICD-10-CM | POA: Insufficient documentation

## 2017-01-06 DIAGNOSIS — I4891 Unspecified atrial fibrillation: Secondary | ICD-10-CM | POA: Diagnosis not present

## 2017-01-06 DIAGNOSIS — D170 Benign lipomatous neoplasm of skin and subcutaneous tissue of head, face and neck: Secondary | ICD-10-CM | POA: Diagnosis not present

## 2017-01-06 DIAGNOSIS — Z79899 Other long term (current) drug therapy: Secondary | ICD-10-CM | POA: Insufficient documentation

## 2017-01-06 DIAGNOSIS — F17210 Nicotine dependence, cigarettes, uncomplicated: Secondary | ICD-10-CM | POA: Insufficient documentation

## 2017-01-06 DIAGNOSIS — F209 Schizophrenia, unspecified: Secondary | ICD-10-CM | POA: Diagnosis not present

## 2017-01-06 DIAGNOSIS — F319 Bipolar disorder, unspecified: Secondary | ICD-10-CM | POA: Insufficient documentation

## 2017-01-06 DIAGNOSIS — K219 Gastro-esophageal reflux disease without esophagitis: Secondary | ICD-10-CM | POA: Insufficient documentation

## 2017-01-06 DIAGNOSIS — Z7902 Long term (current) use of antithrombotics/antiplatelets: Secondary | ICD-10-CM | POA: Insufficient documentation

## 2017-01-06 HISTORY — DX: Cardiac arrhythmia, unspecified: I49.9

## 2017-01-06 HISTORY — PX: EXCISION MASS HEAD: SHX6702

## 2017-01-06 SURGERY — EXCISION, MASS, HEAD
Anesthesia: General | Site: Face

## 2017-01-06 MED ORDER — OXYCODONE HCL 5 MG PO TABS
ORAL_TABLET | ORAL | Status: AC
  Filled 2017-01-06: qty 1

## 2017-01-06 MED ORDER — PROPOFOL 10 MG/ML IV BOLUS
INTRAVENOUS | Status: DC | PRN
  Administered 2017-01-06: 200 mg via INTRAVENOUS

## 2017-01-06 MED ORDER — LACTATED RINGERS IV SOLN
INTRAVENOUS | Status: DC
  Administered 2017-01-06: 08:00:00 via INTRAVENOUS

## 2017-01-06 MED ORDER — ONDANSETRON HCL 4 MG/2ML IJ SOLN
INTRAMUSCULAR | Status: AC
  Filled 2017-01-06: qty 2

## 2017-01-06 MED ORDER — SCOPOLAMINE 1 MG/3DAYS TD PT72: 1.0000 | MEDICATED_PATCH | Freq: Once | TRANSDERMAL | Status: DC | PRN

## 2017-01-06 MED ORDER — BACITRACIN 500 UNIT/GM EX OINT
TOPICAL_OINTMENT | CUTANEOUS | Status: DC | PRN
  Administered 2017-01-06: 1 via TOPICAL

## 2017-01-06 MED ORDER — LIDOCAINE 2% (20 MG/ML) 5 ML SYRINGE
INTRAMUSCULAR | Status: AC
  Filled 2017-01-06: qty 5

## 2017-01-06 MED ORDER — FENTANYL CITRATE (PF) 100 MCG/2ML IJ SOLN
INTRAMUSCULAR | Status: AC
  Filled 2017-01-06: qty 2

## 2017-01-06 MED ORDER — LIDOCAINE 2% (20 MG/ML) 5 ML SYRINGE
INTRAMUSCULAR | Status: DC | PRN
  Administered 2017-01-06: 100 mg via INTRAVENOUS

## 2017-01-06 MED ORDER — ARTIFICIAL TEARS OPHTHALMIC OINT
TOPICAL_OINTMENT | OPHTHALMIC | Status: AC
Start: 2017-01-06 — End: 2017-01-06
  Filled 2017-01-06: qty 3.5

## 2017-01-06 MED ORDER — CEFAZOLIN SODIUM-DEXTROSE 2-4 GM/100ML-% IV SOLN
2.0000 g | INTRAVENOUS | Status: AC
  Administered 2017-01-06: 2 g via INTRAVENOUS

## 2017-01-06 MED ORDER — METOCLOPRAMIDE HCL 5 MG/ML IJ SOLN: 10.0000 mg | Freq: Once | INTRAMUSCULAR | Status: DC | PRN

## 2017-01-06 MED ORDER — CEFAZOLIN SODIUM-DEXTROSE 2-4 GM/100ML-% IV SOLN
INTRAVENOUS | Status: AC
  Filled 2017-01-06: qty 100

## 2017-01-06 MED ORDER — LIDOCAINE-EPINEPHRINE 1 %-1:100000 IJ SOLN
INTRAMUSCULAR | Status: DC | PRN
  Administered 2017-01-06: 5 mL

## 2017-01-06 MED ORDER — MIDAZOLAM HCL 2 MG/2ML IJ SOLN
1.0000 mg | INTRAMUSCULAR | Status: DC | PRN
Start: 2017-01-06 — End: 2017-01-06
  Administered 2017-01-06: 2 mg via INTRAVENOUS

## 2017-01-06 MED ORDER — OXYCODONE HCL 5 MG PO TABS
5.0000 mg | ORAL_TABLET | Freq: Once | ORAL | Status: AC
  Administered 2017-01-06: 5 mg via ORAL

## 2017-01-06 MED ORDER — MEPERIDINE HCL 25 MG/ML IJ SOLN: 6.2500 mg | INTRAMUSCULAR | Status: DC | PRN

## 2017-01-06 MED ORDER — FENTANYL CITRATE (PF) 100 MCG/2ML IJ SOLN
50.0000 ug | INTRAMUSCULAR | Status: DC | PRN
  Administered 2017-01-06: 100 ug via INTRAVENOUS

## 2017-01-06 MED ORDER — LACTATED RINGERS IV SOLN: INTRAVENOUS | Status: DC

## 2017-01-06 MED ORDER — MIDAZOLAM HCL 2 MG/2ML IJ SOLN
INTRAMUSCULAR | Status: AC
  Filled 2017-01-06: qty 2

## 2017-01-06 MED ORDER — OXYCODONE-ACETAMINOPHEN 5-325 MG PO TABS: 1.0000 | ORAL_TABLET | ORAL | 0 refills | Status: DC | PRN

## 2017-01-06 MED ORDER — DEXAMETHASONE SODIUM PHOSPHATE 10 MG/ML IJ SOLN
INTRAMUSCULAR | Status: AC
  Filled 2017-01-06: qty 1

## 2017-01-06 MED ORDER — FENTANYL CITRATE (PF) 100 MCG/2ML IJ SOLN
25.0000 ug | INTRAMUSCULAR | Status: DC | PRN
  Administered 2017-01-06: 50 ug via INTRAVENOUS
  Administered 2017-01-06 (×2): 25 ug via INTRAVENOUS

## 2017-01-06 MED ORDER — PROPOFOL 10 MG/ML IV BOLUS
INTRAVENOUS | Status: AC
  Filled 2017-01-06: qty 20

## 2017-01-06 MED ORDER — DEXAMETHASONE SODIUM PHOSPHATE 4 MG/ML IJ SOLN
INTRAMUSCULAR | Status: DC | PRN
Start: 2017-01-06 — End: 2017-01-06
  Administered 2017-01-06: 10 mg via INTRAVENOUS

## 2017-01-06 SURGICAL SUPPLY — 63 items
APL SKNCLS STERI-STRIP NONHPOA (GAUZE/BANDAGES/DRESSINGS)
BENZOIN TINCTURE PRP APPL 2/3 (GAUZE/BANDAGES/DRESSINGS) IMPLANT
BLADE CLIPPER SURG (BLADE) IMPLANT
BLADE SURG 11 STRL SS (BLADE) IMPLANT
BLADE SURG 15 STRL LF DISP TIS (BLADE) ×1 IMPLANT
BLADE SURG 15 STRL SS (BLADE) ×3
CANISTER SUCT 1200ML W/VALVE (MISCELLANEOUS) IMPLANT
CHLORAPREP W/TINT 26ML (MISCELLANEOUS) ×1 IMPLANT
CLOSURE WOUND 1/2 X4 (GAUZE/BANDAGES/DRESSINGS)
COVER BACK TABLE 60X90IN (DRAPES) ×3 IMPLANT
COVER MAYO STAND STRL (DRAPES) IMPLANT
DRAIN JP 10F RND SILICONE (MISCELLANEOUS) IMPLANT
DRAPE LAPAROTOMY 100X72 PEDS (DRAPES) IMPLANT
DRAPE U-SHAPE 76X120 STRL (DRAPES) ×3 IMPLANT
DRSG TELFA 3X8 NADH (GAUZE/BANDAGES/DRESSINGS) IMPLANT
ELECT COATED BLADE 2.86 ST (ELECTRODE) IMPLANT
ELECT NEEDLE BLADE 2-5/6 (NEEDLE) ×3 IMPLANT
ELECT REM PT RETURN 9FT ADLT (ELECTROSURGICAL) ×3
ELECT REM PT RETURN 9FT PED (ELECTROSURGICAL)
ELECTRODE REM PT RETRN 9FT PED (ELECTROSURGICAL) IMPLANT
ELECTRODE REM PT RTRN 9FT ADLT (ELECTROSURGICAL) ×1 IMPLANT
EVACUATOR SILICONE 100CC (DRAIN) IMPLANT
GAUZE SPONGE 4X4 12PLY STRL LF (GAUZE/BANDAGES/DRESSINGS) IMPLANT
GAUZE XEROFORM 1X8 LF (GAUZE/BANDAGES/DRESSINGS) IMPLANT
GLOVE BIO SURGEON STRL SZ 6 (GLOVE) ×3 IMPLANT
GLOVE BIOGEL PI IND STRL 7.0 (GLOVE) IMPLANT
GLOVE BIOGEL PI INDICATOR 7.0 (GLOVE) ×4
GLOVE SURG SS PI 6.5 STRL IVOR (GLOVE) ×2 IMPLANT
GOWN STRL REUS W/ TWL LRG LVL3 (GOWN DISPOSABLE) ×2 IMPLANT
GOWN STRL REUS W/TWL LRG LVL3 (GOWN DISPOSABLE) ×6
LIQUID BAND (GAUZE/BANDAGES/DRESSINGS) ×3 IMPLANT
NEEDLE HYPO 22GX1.5 SAFETY (NEEDLE) ×3 IMPLANT
NEEDLE HYPO 30GX1 BEV (NEEDLE) IMPLANT
NEEDLE PRECISIONGLIDE 27X1.5 (NEEDLE) ×3 IMPLANT
NS IRRIG 1000ML POUR BTL (IV SOLUTION) ×2 IMPLANT
PACK BASIN DAY SURGERY FS (CUSTOM PROCEDURE TRAY) ×3 IMPLANT
PENCIL BUTTON HOLSTER BLD 10FT (ELECTRODE) ×3 IMPLANT
RUBBERBAND STERILE (MISCELLANEOUS) IMPLANT
SHEET MEDIUM DRAPE 40X70 STRL (DRAPES) IMPLANT
SLEEVE SCD COMPRESS KNEE MED (MISCELLANEOUS) ×3 IMPLANT
SPONGE GAUZE 2X2 8PLY STER LF (GAUZE/BANDAGES/DRESSINGS)
SPONGE GAUZE 2X2 8PLY STRL LF (GAUZE/BANDAGES/DRESSINGS) IMPLANT
SPONGE LAP 18X18 X RAY DECT (DISPOSABLE) IMPLANT
STAPLER VISISTAT 35W (STAPLE) ×3 IMPLANT
STRIP CLOSURE SKIN 1/2X4 (GAUZE/BANDAGES/DRESSINGS) IMPLANT
SUCTION FRAZIER HANDLE 10FR (MISCELLANEOUS) ×2
SUCTION TUBE FRAZIER 10FR DISP (MISCELLANEOUS) ×1 IMPLANT
SUT ETHILON 4 0 PS 2 18 (SUTURE) IMPLANT
SUT MNCRL AB 4-0 PS2 18 (SUTURE) IMPLANT
SUT MON AB 5-0 P3 18 (SUTURE) ×3 IMPLANT
SUT PLAIN 5 0 P 3 18 (SUTURE) IMPLANT
SUT PROLENE 5 0 P 3 (SUTURE) ×2 IMPLANT
SUT PROLENE 6 0 P 1 18 (SUTURE) IMPLANT
SUT VICRYL 4-0 PS2 18IN ABS (SUTURE) IMPLANT
SWAB COLLECTION DEVICE MRSA (MISCELLANEOUS) IMPLANT
SWAB CULTURE ESWAB REG 1ML (MISCELLANEOUS) IMPLANT
SYR BULB 3OZ (MISCELLANEOUS) IMPLANT
SYR CONTROL 10ML LL (SYRINGE) ×3 IMPLANT
TOWEL OR 17X24 6PK STRL BLUE (TOWEL DISPOSABLE) ×3 IMPLANT
TRAY DSU PREP LF (CUSTOM PROCEDURE TRAY) ×3 IMPLANT
TUBE CONNECTING 20'X1/4 (TUBING) ×1
TUBE CONNECTING 20X1/4 (TUBING) ×1 IMPLANT
YANKAUER SUCT BULB TIP 10FT TU (MISCELLANEOUS) IMPLANT

## 2017-01-06 NOTE — Op Note (Signed)
Operative Note   DATE OF OPERATION: 6.22.18  LOCATION: [facility]-outpatient  SURGICAL DIVISION: Plastic Surgery  PREOPERATIVE DIAGNOSES:  1. Forehead mass  POSTOPERATIVE DIAGNOSES:  same  PROCEDURE:  Excision subfascial mass 3.5 cm  SURGEON: Irene Limbo MD MBA  ASSISTANT: none  ANESTHESIA:  General.   EBL: 25 ml  COMPLICATIONS: None immediate.   INDICATIONS FOR PROCEDURE:  The patient, Jason Stokes, is a 56 y.o. male born on Dec 21, 1960, is here for excision mass forehead present for several years.   FINDINGS: Clinically lipoma located beneath frontalis muscle.  DESCRIPTION OF PROCEDURE:  The patient's operative site was marked with the patient in the preoperative area. The patient was taken to the operating room. SCDs were placed and IV antibiotics were given. The patient's operative site was prepped and draped in a sterile fashion. A time out was performed and all information was confirmed to be correct. Bilateral supraorbital nerve blocks completed and additional local anesthetic infiltrated surrounding mass.Transverse incision made over right forehead mass. Incision carried through subcutaneous tissue to frontalis muscle. Muscle divided in direction of fibers. Submuscular dissection completed over mass. Mass dissected from underlying periosteum, diameter 3.5 cm. Cavity irrigated and hemostasis obtained. Closured completed with 5-0 monocryl interrupted in dermis and running 5-0 prolene for skin closure. Antibiotic ointment applied  The patient was allowed to wake from anesthesia, extubated and taken to the recovery room in satisfactory condition.   SPECIMENS: forehead mass  DRAINS: none  Irene Limbo, MD [facility] Plastic & Reconstructive Surgery [phone], pin [phone]

## 2017-01-06 NOTE — Anesthesia Procedure Notes (Signed)
Procedure Name: LMA Insertion Date/Time: 01/06/2017 8:14 AM Performed by: Lyndee Leo Pre-anesthesia Checklist: Patient identified, Emergency Drugs available, Suction available and Patient being monitored Patient Re-evaluated:Patient Re-evaluated prior to inductionOxygen Delivery Method: Circle system utilized Preoxygenation: Pre-oxygenation with 100% oxygen Intubation Type: IV induction Ventilation: Mask ventilation without difficulty LMA: LMA inserted LMA Size: 5.0 Number of attempts: 1 Airway Equipment and Method: Bite block Placement Confirmation: positive ETCO2 Tube secured with: Tape Dental Injury: Teeth and Oropharynx as per pre-operative assessment

## 2017-01-06 NOTE — Anesthesia Preprocedure Evaluation (Signed)
Anesthesia Evaluation  Patient identified by MRN, date of birth, ID band Patient awake    Reviewed: Allergy & Precautions, NPO status , Patient's Chart, lab work & pertinent test results  Airway Mallampati: II  TM Distance: >3 FB Neck ROM: Full    Dental no notable dental hx.    Pulmonary Current Smoker,    Pulmonary exam normal breath sounds clear to auscultation       Cardiovascular hypertension, Pt. on medications Normal cardiovascular exam+ dysrhythmias Atrial Fibrillation  Rhythm:Regular Rate:Normal     Neuro/Psych Bipolar Disorder Schizophrenia CVA, No Residual Symptoms    GI/Hepatic Neg liver ROS, GERD  Medicated and Controlled,  Endo/Other  negative endocrine ROS  Renal/GU negative Renal ROS  negative genitourinary   Musculoskeletal negative musculoskeletal ROS (+)   Abdominal   Peds negative pediatric ROS (+)  Hematology negative hematology ROS (+)   Anesthesia Other Findings   Reproductive/Obstetrics negative OB ROS                             Anesthesia Physical Anesthesia Plan  ASA: III  Anesthesia Plan: General   Post-op Pain Management:    Induction: Intravenous  PONV Risk Score and Plan: 1 and Ondansetron and Dexamethasone  Airway Management Planned: LMA  Additional Equipment:   Intra-op Plan:   Post-operative Plan: Extubation in OR  Informed Consent: I have reviewed the patients History and Physical, chart, labs and discussed the procedure including the risks, benefits and alternatives for the proposed anesthesia with the patient or authorized representative who has indicated his/her understanding and acceptance.   Dental advisory given  Plan Discussed with: CRNA  Anesthesia Plan Comments:         Anesthesia Quick Evaluation

## 2017-01-06 NOTE — H&P (Signed)
  Subjective:     Patient ID: Jason Stokes is a 56 y.o. male.  HPI  Referred by Dr. Ree Stokes for consultation mass forehead present several years with slow continued growth. Now reports tender to palpation and feels the pain ""goes back into eye socket." Has associated chronic HA. Also hx chronic sinusitis. No prior intervention for this. Notes as child had laceration in this area that was repaired.  Patient on Eliquis for AF. Notes prior stroke on imaging, date unknown. Followed by Dr. Shonna Stokes, has next visit 12/2016. Prior ablation for AF, presented to ED in past for AF with RVR.  Accompanied by fiancee. Reviewed [facility] chart with diagnoses schizophrenia and bipolar- patient denies both of these at present, states this was related to trauma of seeing brother die in front of him, but notes he is followed at [facility] center.  Active smoker  Review of Systems  Constitutional: Positive for unexpected weight change.  Eyes: Positive for visual disturbance.  Endocrine: Positive for polydipsia.  Musculoskeletal: Positive for arthralgias and back pain.  Allergic/Immunologic: Positive for environmental allergies.  Neurological: Positive for weakness and headaches.  Psychiatric/Behavioral: Positive for dysphoric mood.  Remainder 12 point review negative     Objective:   Physical Exam  Constitutional: He is oriented to person, place, and time.  Cardiovascular: Normal rate and normal heart sounds.   regular  Pulmonary/Chest: Effort normal and breath sounds normal.  Neurological: He is alert and oriented to person, place, and time.  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 Patient reports tenderness at this time and desires  excision. Dr. Shonna Stokes recommended holding Eliquis 1-2 d prior and resuming as soon as possible following procedure. Patient with event monitor in place presently. I have discussed this as well with Dr. Shonna Stokes who felt procedure could be done. He is in sinus rhythm today.  Counseled pt OP surgery, with transverse scar in natural rhytid over mass. Pt concerned will have scar as present on posterior scalp and counseled no need for leaving wound open or STSG in this case. Reviewed sutures, bruising and should expect numbness forehead and scalp followed by several weeks to months of itching sensation scalp as nerves regrow or neurapraxia branches supraorbital nerve resolve.  Irene Limbo, MD [facility] Plastic & Reconstructive Surgery [phone], pin [phone]

## 2017-01-06 NOTE — Transfer of Care (Signed)
Immediate Anesthesia Transfer of Care Note  [name]  Procedure(s) Performed: Procedure(s): EXCISION MASS FOREHEAD (N/A)  Patient Location: PACU  Anesthesia Type:General  Level of Consciousness: awake, sedated and patient cooperative  Airway & Oxygen Therapy: Patient Spontanous Breathing and Patient connected to face mask oxygen  Post-op Assessment: Report given to RN and Post -op Vital signs reviewed and stable  Post vital signs: Reviewed and stable  Last Vitals:  Vitals:   01/06/17 0731  BP: 123/88  Pulse: 86  Resp: 19  Temp: 36.3 C    Last Pain:  Vitals:   01/06/17 0734  PainSc: 8       Patients Stated Pain Goal: 5 (79/89/21 1941)  Complications: No apparent anesthesia complications

## 2017-01-06 NOTE — Discharge Instructions (Signed)

## 2017-01-06 NOTE — Anesthesia Postprocedure Evaluation (Signed)
Anesthesia Post Note  [name]  Procedure(s) Performed: Procedure(s) (LRB): EXCISION MASS FOREHEAD (N/A)     Patient location during evaluation: PACU Anesthesia Type: General Level of consciousness: awake and alert Pain management: pain level controlled Vital Signs Assessment: post-procedure vital signs reviewed and stable Respiratory status: spontaneous breathing, nonlabored ventilation, respiratory function stable and patient connected to nasal cannula oxygen Cardiovascular status: blood pressure returned to baseline and stable Postop Assessment: no signs of nausea or vomiting Anesthetic complications: no    Last Vitals:  Vitals:   01/06/17 0945 01/06/17 1000  BP: (!) 123/98 126/87  Pulse: 86 85  Resp: 14 18  Temp:  36.6 C    Last Pain:  Vitals:   01/06/17 1000  PainSc: 6                  Montez Hageman

## 2017-01-09 ENCOUNTER — Encounter (HOSPITAL_BASED_OUTPATIENT_CLINIC_OR_DEPARTMENT_OTHER): Payer: Self-pay | Admitting: Plastic Surgery

## 2017-01-16 NOTE — Progress Notes (Signed)
Electrophysiology Office Note   Date:  01/17/2017   ID:  CACHE BILLS, DOB 06-08-1961, MRN [id number]  PCP:  Boykin Nearing, MD  Cardiologist:  [name] Primary Electrophysiologist: Grissel Tyrell [name], MD    Chief Complaint  Patient presents with  . Follow-up    Event monitor/PAF     History of Present Illness: Jason Stokes is a 56 y.o. male who presents today for electrophysiology evaluation.   He is a history of paroxysmal atrial fibrillation, hypertension and remote CVA. He presented to the emergency room with atrial fibrillation and RVR. He was started on Eliquis L diltiazem at that time. He did not follow-up with cardiology until 03/27/15 when he complained of palpitations. He had AF ablation performed on 09/22/15. Unfortunately during his procedure, it was noted that he had a clot in his left atrium and the procedure was therefore canceled. Had repeat ablation 12/10/15.  Today, denies symptoms of palpitations, chest pain, shortness of breath, orthopnea, PND, lower extremity edema, claudication, dizziness, presyncope, syncope, bleeding, or neurologic sequela. The patient is tolerating medications without difficulties and is otherwise without complaint today. His neck had no further episodes of syncope. He is doing well without major complaint.    Past Medical History:  Diagnosis Date  . Anxiety   . Bipolar disorder ([place])   . Chronic lower back pain   . Depression   . Dysrhythmia    a-fib  . GERD (gastroesophageal reflux disease)   . History of alcohol abuse   . History of nuclear stress test    Myoview 10/16: EF 50%, diaphragmatic attenuation, no ischemia, low risk  . Hypertension   . Migraine 2012-2014  . PAF (paroxysmal atrial fibrillation) ([place]) 03/27/2015   a. Myoview neg for ischemia >> Flecainide started 10/16 >> FU ETT   . Schizophrenia ([place])   . Sciatica neuralgia   . Small vessel disease    Right basal ganglia stroke  . Stroke [facility]) "between 2012-2014"     residual "AF" (09/22/2015)   Past Surgical History:  Procedure Laterality Date  . ATRIAL FIBRILLATION ABLATION  09/22/2015  . CYST EXCISION  1996-97   surgery back of head   . ELECTROPHYSIOLOGIC STUDY N/A 09/22/2015   Procedure: Atrial Fibrillation Ablation;  Surgeon: Machele Deihl [name], MD;  Location: [place] [facility];  Service: Cardiovascular;  Laterality: N/A;  . ELECTROPHYSIOLOGIC STUDY N/A 12/10/2015   Procedure: Atrial Fibrillation Ablation;  Surgeon: Edouard Gikas [name], MD;  Location: [place] [facility];  Service: Cardiovascular;  Laterality: N/A;  . ELECTROPHYSIOLOGIC STUDY N/A 12/11/2015   Procedure: Cardioversion;  Surgeon: Talynn Lebon [name], MD;  Location: [place] [facility];  Service: Cardiovascular;  Laterality: N/A;  . EXCISION MASS HEAD N/A 01/06/2017   Procedure: EXCISION MASS FOREHEAD;  Surgeon: Irene Limbo, MD;  Location: [place];  Service: Plastics;  Laterality: N/A;  . GANGLION CYST EXCISION Left   . INTERCOSTAL NERVE BLOCK  2005  . KNEE ARTHROSCOPY Right 2016     Current Outpatient Prescriptions  Medication Sig Dispense Refill  . carbamazepine (TEGRETOL) 200 MG tablet Take 200 mg by mouth 2 (two) times daily.    . carvedilol (COREG) 12.5 MG tablet Take 1 tablet (12.5 mg total) by mouth 2 (two) times daily. 60 tablet 3  . CYMBALTA 60 MG capsule Take 60 mg by mouth daily.  1  . diltiazem (CARDIZEM CD) 240 MG 24 hr capsule Take 1 capsule (240 mg total) by mouth daily. 90 capsule  3  . diphenhydrAMINE (BENADRYL) 25 mg capsule TAKE 2 TABLETS (50 MG TOTAL) BY MOUTH EVERY 6 (SIX) HOURS AS NEEDED FOR ALLERGIES. 60 capsule 0  . ELIQUIS 5 MG TABS tablet TAKE 1 TABLET (5 MG TOTAL) BY MOUTH 2 (TWO) TIMES DAILY. 60 tablet 5  . fluticasone (FLONASE) 50 MCG/ACT nasal spray Place 2 sprays into both nostrils daily. 16 g 3  . hydrALAZINE (APRESOLINE) 100 MG tablet Take 1 tablet (100 mg total) by mouth 3 (three) times daily. 270 tablet 3  . ketotifen (ZADITOR)  0.025 % ophthalmic solution Place 1 drop into both eyes 2 (two) times daily. 10 mL 1  . levETIRAcetam (KEPPRA) 250 MG tablet Take 500 mg by mouth 2 (two) times daily.    Marland Kitchen omeprazole (PRILOSEC) 20 MG capsule TAKE ONE CAPSULE BY MOUTH DAILY 30 capsule 2  . oxyCODONE-acetaminophen (ROXICET) 5-325 MG tablet Take 1-2 tablets by mouth every 4 (four) hours as needed for severe pain. 20 tablet 0  . traZODone (DESYREL) 100 MG tablet Take 100-200 mg by mouth at bedtime.  2  . triamcinolone (NASACORT AQ) 55 MCG/ACT AERO nasal inhaler Place 2 sprays into the nose daily. 1 Inhaler 12  . triamcinolone ointment (KENALOG) 0.5 % Apply 1 application topically 2 (two) times daily. 30 g 0  . VOLTAREN 1 % GEL Apply 1 application topically 4 (four) times daily.  2   No current facility-administered medications for this visit.     Allergies:   Lisinopril   Social History:  The patient  reports that he has been smoking Cigarettes.  He has a 7.25 pack-year smoking history. He has never used smokeless tobacco. He reports that he drinks alcohol. He reports that he uses drugs, including Marijuana.   Family History:  The patient's family history includes Heart disease in his father; Schizophrenia in his sister.    ROS:  Please see the history of present illness.   Otherwise, review of systems is positive for Leg pain, back pain.   All other systems are reviewed and negative.     PHYSICAL EXAM: VS:  BP 128/88   Pulse 80   Ht 6\' 1"  (1.854 m)   Wt 260 lb (117.9 kg)   BMI 34.30 kg/m  , BMI Body mass index is 34.3 kg/m. GEN: Well nourished, well developed, in no acute distress  HEENT: normal  Neck: no JVD, carotid bruits, or masses Cardiac: RRR; no murmurs, rubs, or gallops,no edema  Respiratory:  clear to auscultation bilaterally, normal work of breathing GI: soft, nontender, nondistended, + BS MS: no deformity or atrophy  Skin: warm and dry Neuro:  Strength and sensation are intact Psych: euthymic mood,  full affect  EKG:  EKG is not ordered today. Personal review of the ekg ordered 01/03/17 shows sinus rhythm, rate 74  Recent Labs: 09/13/2016: TSH 1.010 01/03/2017: BUN 14; Creatinine, Ser 1.24; Hemoglobin 13.3; Platelets 176; Potassium 3.9; Sodium 136    Lipid Panel     Component Value Date/Time   CHOL 174 10/17/2013 1042   TRIG 180 (H) 10/17/2013 1042   HDL 32 (L) 10/17/2013 1042   CHOLHDL 5.4 10/17/2013 1042   VLDL 36 10/17/2013 1042   LDLCALC 106 (H) 10/17/2013 1042     Wt Readings from Last 3 Encounters:  01/17/17 260 lb (117.9 kg)  01/06/17 254 lb (115.2 kg)  12/20/16 255 lb 3.2 oz (115.8 kg)     Other studies Reviewed: Additional studies/ records that were reviewed today include:  SPECT  05/15/15, TTE 03/31/15 Review of the above records today demonstrates:  SPECT  The left ventricular ejection fraction is mildly decreased (45-54%).  Nuclear stress EF: 50%.  There was no ST segment deviation noted during stress.  Defect 1: Moderate sized, mild in intensity, fixed defect in the mid and basal inferior and basal inferolateral wall consistent with diaphragmatic attenuation. No ischemia noted.  This is a low risk study.  TTE - Normal LV systolic function; grade 1 diastolic dysfunction; mildLAE.  ASSESSMENT AND PLAN:  1.  Paroxysmal atrial fibrillation: On Eliquis for anticoagulation. Has had episodes of palpitations associated with atrial fibrillation on her 30 day monitor. Has had ablation in the past. Due to that we'll place him on flecainide 100 mg.  This patients CHA2DS2-VASc Score and unadjusted Ischemic Stroke Rate (% per year) is equal to 3.2 % stroke rate/year from a score of 3  Above score calculated as 1 point each if present [CHF, HTN, DM, Vascular=MI/PAD/Aortic Plaque, Age if 65-74, or Male] Above score calculated as 2 points each if present [Age > 75, or Stroke/TIA/TE]   2. Hypertension: Well-controlled today.  3. Syncope: no further syncope.  30 day monitor thus far has shown no episodes that would explain syncope. Continue current monitoring.  Current medicines are reviewed at length with the patient today.   The patient does not have concerns regarding his medicines.  The following changes were made today:  flecainide  Labs/ tests ordered today include:  No orders of the defined types were placed in this encounter.    Disposition:   FU with Corean Yoshimura 6 months.  Signed, Shanyn Preisler [name], MD  01/17/2017 10:15 AM     [facility] [address] [place] [address] [phone] (office) [phone] (fax)

## 2017-01-17 ENCOUNTER — Ambulatory Visit (INDEPENDENT_AMBULATORY_CARE_PROVIDER_SITE_OTHER): Payer: Medicaid Other | Admitting: Cardiology

## 2017-01-17 ENCOUNTER — Encounter: Payer: Self-pay | Admitting: Cardiology

## 2017-01-17 VITALS — BP 128/88 | HR 80 | Ht 73.0 in | Wt 260.0 lb

## 2017-01-17 DIAGNOSIS — Z79899 Other long term (current) drug therapy: Secondary | ICD-10-CM

## 2017-01-17 DIAGNOSIS — I10 Essential (primary) hypertension: Secondary | ICD-10-CM | POA: Diagnosis not present

## 2017-01-17 DIAGNOSIS — I480 Paroxysmal atrial fibrillation: Secondary | ICD-10-CM | POA: Diagnosis not present

## 2017-01-17 MED ORDER — FLECAINIDE ACETATE 100 MG PO TABS: 100.0000 mg | ORAL_TABLET | Freq: Two times a day (BID) | ORAL | 3 refills | Status: DC

## 2017-01-17 NOTE — Patient Instructions (Signed)
Medication Instructions:   Your physician has recommended you make the following change in your medication:  1) START Flecainide 100 mg twice daily  --- start this medication 7-10 days prior to stress test  - If you need a refill on your cardiac medications before your next appointment, please call your pharmacy.   Labwork:  None ordered  Testing/Procedures: Your physician has requested that you have an exercise tolerance test. For further information please visit [email or web address]. Please also follow instruction sheet, as given.  -- you will start Flecainide 7 - 10 days prior to stress test  Follow-Up:  Your physician wants you to follow-up in: 6 months with Dr. Curt Bears. You will receive a reminder letter in the mail two months in advance. If you don't receive a letter, please call our office to schedule the follow-up appointment.  Thank you for choosing [facility]!!   Trinidad Curet, RN [phone]  Any Other Special Instructions Will Be Listed Below (If Applicable).  Flecainide tablets What is this medicine? FLECAINIDE (FLEK a nide) is an antiarrhythmic drug. This medicine is used to prevent irregular heart rhythm. It can also slow down fast heartbeats called tachycardia. This medicine may be used for other purposes; ask your health care provider or pharmacist if you have questions. COMMON BRAND NAME(S): Tambocor What should I tell my health care provider before I take this medicine? They need to know if you have any of these conditions: -abnormal levels of potassium in the blood -heart disease including heart rhythm and heart rate problems -kidney or liver disease -recent heart attack -an unusual or allergic reaction to flecainide, local anesthetics, other medicines, foods, dyes, or preservatives -pregnant or trying to get pregnant -breast-feeding How should I use this medicine? Take this medicine by mouth with a glass of water. Follow the directions on the  prescription label. You can take this medicine with or without food. Take your doses at regular intervals. Do not take your medicine more often than directed. Do not stop taking this medicine suddenly. This may cause serious, heart-related side effects. If your doctor wants you to stop the medicine, the dose may be slowly lowered over time to avoid any side effects. Talk to your pediatrician regarding the use of this medicine in children. While this drug may be prescribed for children as young as 1 year of age for selected conditions, precautions do apply. Overdosage: If you think you have taken too much of this medicine contact a poison control center or emergency room at once. NOTE: This medicine is only for you. Do not share this medicine with others. What if I miss a dose? If you miss a dose, take it as soon as you can. If it is almost time for your next dose, take only that dose. Do not take double or extra doses. What may interact with this medicine? Do not take this medicine with any of the following medications: -amoxapine -arsenic trioxide -certain antibiotics like clarithromycin, erythromycin, gatifloxacin, gemifloxacin, levofloxacin, moxifloxacin, sparfloxacin, or troleandomycin -certain antidepressants called tricyclic antidepressants like amitriptyline, imipramine, or nortriptyline -certain medicines to control heart rhythm like disopyramide, dofetilide, encainide, moricizine, procainamide, propafenone, and quinidine -cisapride -cyclobenzaprine -delavirdine -droperidol -haloperidol -hawthorn -imatinib -levomethadyl -maprotiline -medicines for malaria like chloroquine and halofantrine -pentamidine -phenothiazines like chlorpromazine, mesoridazine, prochlorperazine, thioridazine -pimozide -quinine -ranolazine -ritonavir -sertindole -ziprasidone This medicine may also interact with the following medications: -cimetidine -medicines for angina or high blood  pressure -medicines to control heart rhythm like amiodarone and digoxin This list may  not describe all possible interactions. Give your health care provider a list of all the medicines, herbs, non-prescription drugs, or dietary supplements you use. Also tell them if you smoke, drink alcohol, or use illegal drugs. Some items may interact with your medicine. What should I watch for while using this medicine? Visit your doctor or health care professional for regular checks on your progress. Because your condition and the use of this medicine carries some risk, it is a good idea to carry an identification card, necklace or bracelet with details of your condition, medications and doctor or health care professional. Check your blood pressure and pulse rate regularly. Ask your health care professional what your blood pressure and pulse rate should be, and when you should contact him or her. Your doctor or health care professional also may schedule regular blood tests and electrocardiograms to check your progress. You may get drowsy or dizzy. Do not drive, use machinery, or do anything that needs mental alertness until you know how this medicine affects you. Do not stand or sit up quickly, especially if you are an older patient. This reduces the risk of dizzy or fainting spells. Alcohol can make you more dizzy, increase flushing and rapid heartbeats. Avoid alcoholic drinks. What side effects may I notice from receiving this medicine? Side effects that you should report to your doctor or health care professional as soon as possible: -chest pain, continued irregular heartbeats -difficulty breathing -swelling of the legs or feet -trembling, shaking -unusually weak or tired Side effects that usually do not require medical attention (report to your doctor or health care professional if they continue or are bothersome): -blurred vision -constipation -headache -nausea, vomiting -stomach pain This list may not  describe all possible side effects. Call your doctor for medical advice about side effects. You may report side effects to FDA at 1-800-FDA-1088. Where should I keep my medicine? Keep out of the reach of children. Store at room temperature between 15 and 30 degrees C (59 and 86 degrees F). Protect from light. Keep container tightly closed. Throw away any unused medicine after the expiration date. NOTE: This sheet is a summary. It may not cover all possible information. If you have questions about this medicine, talk to your doctor, pharmacist, or health care provider.  2018 [employer]vier/Gold Standard (2007-11-07 16:46:09)

## 2017-01-18 DIAGNOSIS — I480 Paroxysmal atrial fibrillation: Secondary | ICD-10-CM

## 2017-01-18 DIAGNOSIS — R55 Syncope and collapse: Secondary | ICD-10-CM

## 2017-01-19 ENCOUNTER — Telehealth: Payer: Self-pay

## 2017-01-19 NOTE — Telephone Encounter (Signed)
Pt was called and informed of lab results. 

## 2017-01-24 ENCOUNTER — Ambulatory Visit: Payer: Medicaid Other | Admitting: Cardiology

## 2017-01-26 ENCOUNTER — Ambulatory Visit (INDEPENDENT_AMBULATORY_CARE_PROVIDER_SITE_OTHER): Payer: Medicaid Other

## 2017-01-26 DIAGNOSIS — Z79899 Other long term (current) drug therapy: Secondary | ICD-10-CM | POA: Diagnosis not present

## 2017-01-26 DIAGNOSIS — I480 Paroxysmal atrial fibrillation: Secondary | ICD-10-CM

## 2017-01-30 ENCOUNTER — Ambulatory Visit (INDEPENDENT_AMBULATORY_CARE_PROVIDER_SITE_OTHER): Payer: Medicaid Other | Admitting: Cardiology

## 2017-01-30 ENCOUNTER — Encounter: Payer: Self-pay | Admitting: Cardiology

## 2017-01-30 VITALS — BP 124/90 | HR 75 | Ht 73.0 in | Wt 265.8 lb

## 2017-01-30 DIAGNOSIS — I480 Paroxysmal atrial fibrillation: Secondary | ICD-10-CM

## 2017-01-30 DIAGNOSIS — I472 Ventricular tachycardia: Secondary | ICD-10-CM

## 2017-01-30 DIAGNOSIS — I4729 Other ventricular tachycardia: Secondary | ICD-10-CM

## 2017-01-30 DIAGNOSIS — I10 Essential (primary) hypertension: Secondary | ICD-10-CM | POA: Diagnosis not present

## 2017-01-30 LAB — EXERCISE TOLERANCE TEST
Estimated workload: 4.6 METS
Exercise duration (min): 3 min
Exercise duration (sec): 39 s
MPHR: 165 {beats}/min
Peak HR: 142 {beats}/min
Percent HR: 86 %
RPE: 13
Rest HR: 96 {beats}/min

## 2017-01-30 MED ORDER — DILTIAZEM HCL ER COATED BEADS 300 MG PO CP24: 300.0000 mg | ORAL_CAPSULE | Freq: Every day | ORAL | 2 refills | Status: DC

## 2017-01-30 NOTE — Progress Notes (Signed)
Electrophysiology Office Note   Date:  01/30/2017   ID:  Jason MORSS, DOB 1960-07-30, MRN [id number]  PCP:  Boykin Nearing, MD  Cardiologist:  [name] Primary Electrophysiologist: Taariq Leitz [name], MD    Chief Complaint  Patient presents with  . Follow-up    PAF/VT during GXT     History of Present Illness: Jason Stokes is a 56 y.o. male who presents today for electrophysiology evaluation.   He is a history of paroxysmal atrial fibrillation, hypertension and remote CVA. He presented to the emergency room with atrial fibrillation and RVR. He was started on Eliquis and diltiazem at that time. He did not follow-up with cardiology until 03/27/15 when he complained of palpitations. He had AF ablation performed on 09/22/15. Unfortunately during his procedure, it was noted that he had a clot in his left atrium and the procedure was therefore canceled. Had repeat ablation 12/10/15. He had a stress test performed after initiation of flecainide. He had ventricular ectopy with nonsustained VT.  Today, denies symptoms of palpitations, chest pain, shortness of breath, orthopnea, PND, lower extremity edema, claudication, dizziness, presyncope, syncope, bleeding, or neurologic sequela. The patient is tolerating medications without difficulties. He has been having muscle cramps in his chest. He says that he takes mustard and this helps with his cramping. There are no exacerbating factors.   Past Medical History:  Diagnosis Date  . Anxiety   . Bipolar disorder ([place])   . Chronic lower back pain   . Depression   . Dysrhythmia    a-fib  . GERD (gastroesophageal reflux disease)   . History of alcohol abuse   . History of nuclear stress test    Myoview 10/16: EF 50%, diaphragmatic attenuation, no ischemia, low risk  . Hypertension   . Migraine 2012-2014  . PAF (paroxysmal atrial fibrillation) ([place]) 03/27/2015   a. Myoview neg for ischemia >> Flecainide started 10/16 >> FU ETT   .  Schizophrenia ([place])   . Sciatica neuralgia   . Small vessel disease    Right basal ganglia stroke  . Stroke [facility]) "between 2012-2014"   residual "AF" (09/22/2015)   Past Surgical History:  Procedure Laterality Date  . ATRIAL FIBRILLATION ABLATION  09/22/2015  . CYST EXCISION  1996-97   surgery back of head   . ELECTROPHYSIOLOGIC STUDY N/A 09/22/2015   Procedure: Atrial Fibrillation Ablation;  Surgeon: Dashiell Franchino [name], MD;  Location: [place] [facility];  Service: Cardiovascular;  Laterality: N/A;  . ELECTROPHYSIOLOGIC STUDY N/A 12/10/2015   Procedure: Atrial Fibrillation Ablation;  Surgeon: Berklie Dethlefs [name], MD;  Location: [place] [facility];  Service: Cardiovascular;  Laterality: N/A;  . ELECTROPHYSIOLOGIC STUDY N/A 12/11/2015   Procedure: Cardioversion;  Surgeon: Nakoa Ganus [name], MD;  Location: [place] [facility];  Service: Cardiovascular;  Laterality: N/A;  . EXCISION MASS HEAD N/A 01/06/2017   Procedure: EXCISION MASS FOREHEAD;  Surgeon: Irene Limbo, MD;  Location: [place];  Service: Plastics;  Laterality: N/A;  . GANGLION CYST EXCISION Left   . INTERCOSTAL NERVE BLOCK  2005  . KNEE ARTHROSCOPY Right 2016     Current Outpatient Prescriptions  Medication Sig Dispense Refill  . carbamazepine (TEGRETOL) 200 MG tablet Take 200 mg by mouth 2 (two) times daily.    . carvedilol (COREG) 12.5 MG tablet Take 1 tablet (12.5 mg total) by mouth 2 (two) times daily. 60 tablet 3  . CYMBALTA 60 MG capsule Take 60 mg by mouth daily.  1  .  diltiazem (CARDIZEM CD) 240 MG 24 hr capsule Take 1 capsule (240 mg total) by mouth daily. 90 capsule 3  . diphenhydrAMINE (BENADRYL) 25 mg capsule TAKE 2 TABLETS (50 MG TOTAL) BY MOUTH EVERY 6 (SIX) HOURS AS NEEDED FOR ALLERGIES. 60 capsule 0  . ELIQUIS 5 MG TABS tablet TAKE 1 TABLET (5 MG TOTAL) BY MOUTH 2 (TWO) TIMES DAILY. 60 tablet 5  . fluticasone (FLONASE) 50 MCG/ACT nasal spray Place 2 sprays into both nostrils daily. 16 g 3  .  hydrALAZINE (APRESOLINE) 100 MG tablet Take 1 tablet (100 mg total) by mouth 3 (three) times daily. 270 tablet 3  . ketotifen (ZADITOR) 0.025 % ophthalmic solution Place 1 drop into both eyes 2 (two) times daily. 10 mL 1  . levETIRAcetam (KEPPRA) 250 MG tablet Take 500 mg by mouth 2 (two) times daily.    Marland Kitchen omeprazole (PRILOSEC) 20 MG capsule TAKE ONE CAPSULE BY MOUTH DAILY 30 capsule 2  . oxyCODONE-acetaminophen (ROXICET) 5-325 MG tablet Take 1-2 tablets by mouth every 4 (four) hours as needed for severe pain. 20 tablet 0  . traZODone (DESYREL) 100 MG tablet Take 100-200 mg by mouth at bedtime.  2  . triamcinolone (NASACORT AQ) 55 MCG/ACT AERO nasal inhaler Place 2 sprays into the nose daily. 1 Inhaler 12  . triamcinolone ointment (KENALOG) 0.5 % Apply 1 application topically 2 (two) times daily. 30 g 0  . VOLTAREN 1 % GEL Apply 1 application topically 4 (four) times daily.  2   No current facility-administered medications for this visit.     Allergies:   Lisinopril   Social History:  The patient  reports that he has been smoking Cigarettes.  He has a 7.25 pack-year smoking history. He has never used smokeless tobacco. He reports that he drinks alcohol. He reports that he uses drugs, including Marijuana.   Family History:  The patient's family history includes Heart disease in his father; Schizophrenia in his sister.    ROS:  Please see the history of present illness.   Otherwise, review of systems is positive for Leg pain, muscle pain, back pain.   All other systems are reviewed and negative.   PHYSICAL EXAM: VS:  BP 124/90   Pulse 75   Ht 6\' 1"  (1.854 m)   Wt 265 lb 12.8 oz (120.6 kg)   BMI 35.07 kg/m  , BMI Body mass index is 35.07 kg/m. GEN: Well nourished, well developed, in no acute distress  HEENT: normal  Neck: no JVD, carotid bruits, or masses Cardiac: RRR; no murmurs, rubs, or gallops,no edema  Respiratory:  clear to auscultation bilaterally, normal work of breathing GI:  soft, nontender, nondistended, + BS MS: no deformity or atrophy  Skin: warm and dry Neuro:  Strength and sensation are intact Psych: euthymic mood, full affect  EKG:  EKG is ordered today. Personal review of the ekg ordered shows sinus rhythm, rate 75   Recent Labs: 09/13/2016: TSH 1.010 01/03/2017: BUN 14; Creatinine, Ser 1.24; Hemoglobin 13.3; Platelets 176; Potassium 3.9; Sodium 136    Lipid Panel     Component Value Date/Time   CHOL 174 10/17/2013 1042   TRIG 180 (H) 10/17/2013 1042   HDL 32 (L) 10/17/2013 1042   CHOLHDL 5.4 10/17/2013 1042   VLDL 36 10/17/2013 1042   LDLCALC 106 (H) 10/17/2013 1042     Wt Readings from Last 3 Encounters:  01/30/17 265 lb 12.8 oz (120.6 kg)  01/17/17 260 lb (117.9 kg)  01/06/17 254  lb (115.2 kg)     Other studies Reviewed: Additional studies/ records that were reviewed today include:  SPECT 05/15/15, TTE 03/31/15 Review of the above records today demonstrates:  SPECT  The left ventricular ejection fraction is mildly decreased (45-54%).  Nuclear stress EF: 50%.  There was no ST segment deviation noted during stress.  Defect 1: Moderate sized, mild in intensity, fixed defect in the mid and basal inferior and basal inferolateral wall consistent with diaphragmatic attenuation. No ischemia noted.  This is a low risk study.  TTE - Normal LV systolic function; grade 1 diastolic dysfunction; mildLAE.  ETT 01/26/17 - personally reviewed  Blood pressure demonstrated a normal response to exercise.  There was no ST segment deviation noted during stress.  Negative study for exercise induced ischemia.  There were frequent PVCs, ventricular couplets and ventricular salvos.   ASSESSMENT AND PLAN:  1.  Paroxysmal atrial fibrillation: On Eliquis for anticoagulation. Was started on flecainide, but had nonsustained VT and PVCs on his treadmill test. He does not wish to have any more antiarrhythmics at this time. We'll increase his  diltiazem to 300 mg.  This patients CHA2DS2-VASc Score and unadjusted Ischemic Stroke Rate (% per year) is equal to 3.2 % stroke rate/year from a score of 3  Above score calculated as 1 point each if present [CHF, HTN, DM, Vascular=MI/PAD/Aortic Plaque, Age if 65-74, or Male] Above score calculated as 2 points each if present [Age > 75, or Stroke/TIA/TE]   2. Hypertension: Well-controlled today. No changes at this time.  3. Syncope: No further episodes.  Current medicines are reviewed at length with the patient today.   The patient does not have concerns regarding his medicines.  The following changes were made today:  increase dltiazem  Labs/ tests ordered today include:  No orders of the defined types were placed in this encounter.    Disposition:   FU with Fidelis Loth 6 months.  Signed, Apostolos Blagg [name], MD  01/30/2017 11:25 AM     [place] [address] [address] [phone] (office) [phone] (fax)

## 2017-01-30 NOTE — Patient Instructions (Signed)
Medication Instructions:    Your physician has recommended you make the following change in your medication:   1) INCREASE Diltiazem to 300 mg daily  - If you need a refill on your cardiac medications before your next appointment, please call your pharmacy.   Labwork:  Today: BMET & Magnesium level  Testing/Procedures:  None ordered  Follow-Up:  Your physician wants you to follow-up in: 6 months with Dr. Curt Bears. You will receive a reminder letter in the mail two months in advance. If you don't receive a letter, please call our office to schedule the follow-up appointment.  Thank you for choosing [facility]!!   Trinidad Curet, RN [phone]  Any Other Special Instructions Will Be Listed Below (If Applicable).

## 2017-01-31 LAB — BASIC METABOLIC PANEL
BUN/Creatinine Ratio: 11 (ref 9–20)
BUN: 14 mg/dL (ref 6–24)
CO2: 23 mmol/L (ref 20–29)
Calcium: 8.8 mg/dL (ref 8.7–10.2)
Chloride: 104 mmol/L (ref 96–106)
Creatinine, Ser: 1.25 mg/dL (ref 0.76–1.27)
GFR calc Af Amer: 74 mL/min/{1.73_m2} (ref >59)
GFR calc non Af Amer: 64 mL/min/{1.73_m2} (ref >59)
Glucose: 86 mg/dL (ref 65–99)
Potassium: 4.2 mmol/L (ref 3.5–5.2)
Sodium: 141 mmol/L (ref 134–144)

## 2017-01-31 LAB — MAGNESIUM: Magnesium: 2.2 mg/dL (ref 1.6–2.3)

## 2017-02-02 ENCOUNTER — Ambulatory Visit: Payer: Medicaid Other | Admitting: Cardiology

## 2017-02-25 ENCOUNTER — Other Ambulatory Visit: Payer: Self-pay | Admitting: Family Medicine

## 2017-03-22 ENCOUNTER — Ambulatory Visit: Payer: Medicaid Other | Admitting: Cardiology

## 2017-04-03 ENCOUNTER — Ambulatory Visit: Payer: Medicaid Other | Attending: Family Medicine | Admitting: Family Medicine

## 2017-04-03 ENCOUNTER — Encounter: Payer: Self-pay | Admitting: Family Medicine

## 2017-04-03 VITALS — BP 142/87 | HR 81 | Temp 97.7°F | Ht 73.0 in | Wt 261.8 lb

## 2017-04-03 DIAGNOSIS — F3160 Bipolar disorder, current episode mixed, unspecified: Secondary | ICD-10-CM | POA: Insufficient documentation

## 2017-04-03 DIAGNOSIS — J3089 Other allergic rhinitis: Secondary | ICD-10-CM | POA: Diagnosis not present

## 2017-04-03 DIAGNOSIS — M5441 Lumbago with sciatica, right side: Secondary | ICD-10-CM | POA: Diagnosis not present

## 2017-04-03 DIAGNOSIS — F3161 Bipolar disorder, current episode mixed, mild: Secondary | ICD-10-CM | POA: Diagnosis not present

## 2017-04-03 DIAGNOSIS — Z9889 Other specified postprocedural states: Secondary | ICD-10-CM | POA: Diagnosis not present

## 2017-04-03 DIAGNOSIS — G8929 Other chronic pain: Secondary | ICD-10-CM

## 2017-04-03 DIAGNOSIS — Z131 Encounter for screening for diabetes mellitus: Secondary | ICD-10-CM

## 2017-04-03 DIAGNOSIS — J309 Allergic rhinitis, unspecified: Secondary | ICD-10-CM | POA: Diagnosis not present

## 2017-04-03 DIAGNOSIS — I480 Paroxysmal atrial fibrillation: Secondary | ICD-10-CM | POA: Insufficient documentation

## 2017-04-03 DIAGNOSIS — F319 Bipolar disorder, unspecified: Secondary | ICD-10-CM | POA: Insufficient documentation

## 2017-04-03 DIAGNOSIS — H1012 Acute atopic conjunctivitis, left eye: Secondary | ICD-10-CM

## 2017-04-03 DIAGNOSIS — I10 Essential (primary) hypertension: Secondary | ICD-10-CM | POA: Insufficient documentation

## 2017-04-03 DIAGNOSIS — M545 Low back pain: Secondary | ICD-10-CM | POA: Diagnosis present

## 2017-04-03 DIAGNOSIS — Z79899 Other long term (current) drug therapy: Secondary | ICD-10-CM | POA: Insufficient documentation

## 2017-04-03 LAB — POCT GLYCOSYLATED HEMOGLOBIN (HGB A1C): Hemoglobin A1C: 5.7

## 2017-04-03 MED ORDER — MONTELUKAST SODIUM 10 MG PO TABS: 10.0000 mg | ORAL_TABLET | Freq: Every day | ORAL | 3 refills | Status: DC

## 2017-04-03 MED ORDER — OLOPATADINE HCL 0.1 % OP SOLN: 1.0000 [drp] | Freq: Two times a day (BID) | OPHTHALMIC | 5 refills | Status: DC

## 2017-04-03 NOTE — Progress Notes (Signed)
Subjective:  Patient ID: Jason Stokes, male    DOB: 11-03-1960  Age: 56 y.o. MRN: [id number]  CC: Back Pain and Sinusitis   HPI Jason Stokes is a 56 year old male with a history of hypertension, type 2 diabetes mellitus (A1c 5.7), bipolar disorder, paroxysmal A. fib (previous AF ablation)who presents to the clinic to establish care with me. He was previously followed by Dr Jason Stokes.  His his last visit to EP cardiology wasin 01/2017 at which time his Flecainide was discontinued (due to PVCs and nonsustained VTs on exercise stress test) and Diltiazem dose was increased; he denies any chest pains shortness of breath at this time. Exercise stress test from 01/2017 was negative for exercise-induced ischemia but revealed frequent PVCs, ventricular couplets and salvos.  He complains of having "a sinus infection" and needing antibiotic. He has had postnasal drip, sneezing, coughing, tearing from his eyes which he states has been on for the last 2 weeks and has improved.  He also complains of low back pain which is chronic and radiates to his right leg. States that gabapentin and muscle relaxants do not work. Pain is rated at a 10/10 and increases with sitting but is improved with the use of a back brace and use of marijuana. Denies loss of sphincteric function and ambulates with the aid of a cane.  He receives mental health care [place]. Denies suicidal ideations or intents.  Past Medical History:  Diagnosis Date  . Anxiety   . Bipolar disorder ([place])   . Chronic lower back pain   . Depression   . Dysrhythmia    a-fib  . GERD (gastroesophageal reflux disease)   . History of alcohol abuse   . History of nuclear stress test    Myoview 10/16: EF 50%, diaphragmatic attenuation, no ischemia, low risk  . Hypertension   . Migraine 2012-2014  . PAF (paroxysmal atrial fibrillation) ([place]) 03/27/2015   a. Myoview neg for ischemia >> Flecainide started 10/16 >> FU ETT   . Schizophrenia ([place])   .  Sciatica neuralgia   . Small vessel disease    Right basal ganglia stroke  . Stroke [facility]) "between 2012-2014"   residual "AF" (09/22/2015)    Past Surgical History:  Procedure Laterality Date  . ATRIAL FIBRILLATION ABLATION  09/22/2015  . CYST EXCISION  1996-97   surgery back of head   . ELECTROPHYSIOLOGIC STUDY N/A 09/22/2015   Procedure: Atrial Fibrillation Ablation;  Surgeon: Will [name], MD;  Location: [place] [facility];  Service: Cardiovascular;  Laterality: N/A;  . ELECTROPHYSIOLOGIC STUDY N/A 12/10/2015   Procedure: Atrial Fibrillation Ablation;  Surgeon: Will [name], MD;  Location: [place] [facility];  Service: Cardiovascular;  Laterality: N/A;  . ELECTROPHYSIOLOGIC STUDY N/A 12/11/2015   Procedure: Cardioversion;  Surgeon: Will [name], MD;  Location: [place] [facility];  Service: Cardiovascular;  Laterality: N/A;  . EXCISION MASS HEAD N/A 01/06/2017   Procedure: EXCISION MASS FOREHEAD;  Surgeon: Irene Limbo, MD;  Location: [place];  Service: Plastics;  Laterality: N/A;  . GANGLION CYST EXCISION Left   . INTERCOSTAL NERVE BLOCK  2005  . KNEE ARTHROSCOPY Right 2016    Allergies  Allergen Reactions  . Lisinopril Anaphylaxis    Swelling of lips and tongue.     Outpatient Medications Prior to Visit  Medication Sig Dispense Refill  . carbamazepine (TEGRETOL) 200 MG tablet Take 200 mg by mouth 2 (two) times daily.    . carvedilol (COREG)  12.5 MG tablet Take 1 tablet (12.5 mg total) by mouth 2 (two) times daily. 60 tablet 3  . CYMBALTA 60 MG capsule Take 60 mg by mouth daily.  1  . diltiazem (CARDIZEM CD) 300 MG 24 hr capsule Take 1 capsule (300 mg total) by mouth daily. 90 capsule 2  . diphenhydrAMINE (BENADRYL) 25 mg capsule TAKE 2 TABLETS (50 MG TOTAL) BY MOUTH EVERY 6 (SIX) HOURS AS NEEDED FOR ALLERGIES. 60 capsule 0  . ELIQUIS 5 MG TABS tablet TAKE 1 TABLET (5 MG TOTAL) BY MOUTH 2 (TWO) TIMES DAILY. 60 tablet 5  . fluticasone  (FLONASE) 50 MCG/ACT nasal spray Place 2 sprays into both nostrils daily. 16 g 3  . ketotifen (ZADITOR) 0.025 % ophthalmic solution Place 1 drop into both eyes 2 (two) times daily. 10 mL 1  . levETIRAcetam (KEPPRA) 250 MG tablet Take 500 mg by mouth 2 (two) times daily.    Marland Kitchen omeprazole (PRILOSEC) 20 MG capsule TAKE ONE CAPSULE BY MOUTH DAILY 30 capsule 2  . oxyCODONE-acetaminophen (ROXICET) 5-325 MG tablet Take 1-2 tablets by mouth every 4 (four) hours as needed for severe pain. 20 tablet 0  . traZODone (DESYREL) 100 MG tablet Take 100-200 mg by mouth at bedtime.  2  . triamcinolone (NASACORT AQ) 55 MCG/ACT AERO nasal inhaler Place 2 sprays into the nose daily. 1 Inhaler 12  . triamcinolone ointment (KENALOG) 0.5 % Apply 1 application topically 2 (two) times daily. 30 g 0  . VOLTAREN 1 % GEL Apply 1 application topically 4 (four) times daily.  2  . hydrALAZINE (APRESOLINE) 100 MG tablet Take 1 tablet (100 mg total) by mouth 3 (three) times daily. 270 tablet 3   No facility-administered medications prior to visit.     ROS Review of Systems  Constitutional: Negative for activity change and appetite change.  HENT: Negative for sinus pressure and sore throat.   Eyes: Negative for visual disturbance.  Respiratory: Negative for cough, chest tightness and shortness of breath.   Cardiovascular: Negative for chest pain and leg swelling.  Gastrointestinal: Negative for abdominal distention, abdominal pain, constipation and diarrhea.  Endocrine: Negative.   Genitourinary: Negative for dysuria.  Musculoskeletal: Positive for back pain. Negative for joint swelling and myalgias.  Skin: Negative for rash.  Allergic/Immunologic: Negative.   Neurological: Negative for weakness, light-headedness and numbness.  Psychiatric/Behavioral: Negative for dysphoric mood and suicidal ideas.    Objective:  BP (!) 142/87   Pulse 81   Temp 97.7 F (36.5 C) (Oral)   Ht 6\' 1"  (1.854 m)   Wt 261 lb 12.8 oz (118.8  kg)   SpO2 98%   BMI 34.54 kg/m   BP/Weight 04/03/2017 4/33/2951 02/22/4165  Systolic BP 063 016 010  Diastolic BP 87 90 88  Wt. (Lbs) 261.8 265.8 260  BMI 34.54 35.07 34.3      Physical Exam  Constitutional: He is oriented to person, place, and time. He appears well-developed and well-nourished.  Cardiovascular: Normal rate, normal heart sounds and intact distal pulses.   No murmur heard. Pulmonary/Chest: Effort normal and breath sounds normal. He has no wheezes. He has no rales. He exhibits no tenderness.  Abdominal: Soft. Bowel sounds are normal. He exhibits no distension and no mass. There is no tenderness.  Musculoskeletal: He exhibits tenderness (tenderness to palpation of mid lumbar spine).  Unable to lie supine due to pain  Neurological: He is alert and oriented to person, place, and time.  Skin: Skin is warm and  dry.  Psychiatric: He has a normal mood and affect.     Assessment & Plan:   1. PAF (paroxysmal atrial fibrillation) (HCC) On rate control with Cardizem and anticoagulation with Eliquis  2. Non-seasonal allergic rhinitis due to other allergic trigger Uncontrolled on current antihistamine and Flonase - montelukast (SINGULAIR) 10 MG tablet; Take 1 tablet (10 mg total) by mouth at bedtime.  Dispense: 30 tablet; Refill: 3  3. Chronic right-sided low back pain with right-sided sciatica Refuses initiation of muscle relaxants and gabapentin-states they do not work Continue Cymbalta - Ambulatory referral to Pain Clinic  4. Bipolar disorder, current episode mixed, mild (HCC) Currently on Cymbalta, trazodone Management as per mental health at [facility]  5. Allergic rhinoconjunctivitis - olopatadine (PATANOL) 0.1 % ophthalmic solution; Place 1 drop into both eyes 2 (two) times daily.  Dispense: 5 mL; Refill: 5  6. Diabetes mellitus screening A1c 5.7 - POCT glycosylated hemoglobin (Hb A1C)  7. Essential hypertension Controlled   Meds ordered this encounter    Medications  . olopatadine (PATANOL) 0.1 % ophthalmic solution    Sig: Place 1 drop into both eyes 2 (two) times daily.    Dispense:  5 mL    Refill:  5  . montelukast (SINGULAIR) 10 MG tablet    Sig: Take 1 tablet (10 mg total) by mouth at bedtime.    Dispense:  30 tablet    Refill:  3    Follow-up: Return in about 3 months (around 07/03/2017) for follow up on chronic medical conditions.   Arnoldo Morale MD

## 2017-04-03 NOTE — Patient Instructions (Signed)
Allergic Rhinitis Allergic rhinitis is when the mucous membranes in the nose respond to allergens. Allergens are particles in the air that cause your body to have an allergic reaction. This causes you to release allergic antibodies. Through a chain of events, these eventually cause you to release histamine into the blood stream. Although meant to protect the body, it is this release of histamine that causes your discomfort, such as frequent sneezing, congestion, and an itchy, runny nose. What are the causes? Seasonal allergic rhinitis (hay fever) is caused by pollen allergens that may come from grasses, trees, and weeds. Year-round allergic rhinitis (perennial allergic rhinitis) is caused by allergens such as house dust mites, pet dander, and mold spores. What are the signs or symptoms?  Nasal stuffiness (congestion).  Itchy, runny nose with sneezing and tearing of the eyes. How is this diagnosed? Your health care provider can help you determine the allergen or allergens that trigger your symptoms. If you and your health care provider are unable to determine the allergen, skin or blood testing may be used. Your health care provider will diagnose your condition after taking your health history and performing a physical exam. Your health care provider may assess you for other related conditions, such as asthma, pink eye, or an ear infection. How is this treated? Allergic rhinitis does not have a cure, but it can be controlled by:  Medicines that block allergy symptoms. These may include allergy shots, nasal sprays, and oral antihistamines.  Avoiding the allergen. Hay fever may often be treated with antihistamines in pill or nasal spray forms. Antihistamines block the effects of histamine. There are over-the-counter medicines that may help with nasal congestion and swelling around the eyes. Check with your health care provider before taking or giving this medicine. If avoiding the allergen or the  medicine prescribed do not work, there are many new medicines your health care provider can prescribe. Stronger medicine may be used if initial measures are ineffective. Desensitizing injections can be used if medicine and avoidance does not work. Desensitization is when a patient is given ongoing shots until the body becomes less sensitive to the allergen. Make sure you follow up with your health care provider if problems continue. Follow these instructions at home: It is not possible to completely avoid allergens, but you can reduce your symptoms by taking steps to limit your exposure to them. It helps to know exactly what you are allergic to so that you can avoid your specific triggers. Contact a health care provider if:  You have a fever.  You develop a cough that does not stop easily (persistent).  You have shortness of breath.  You start wheezing.  Symptoms interfere with normal daily activities. This information is not intended to replace advice given to you by your health care provider. Make sure you discuss any questions you have with your health care provider. Document Released: 03/29/2001 Document Revised: 03/04/2016 Document Reviewed: 03/11/2013 Elsevier Interactive Patient Education  2017 Elsevier Inc.  

## 2017-04-12 ENCOUNTER — Ambulatory Visit (INDEPENDENT_AMBULATORY_CARE_PROVIDER_SITE_OTHER): Payer: Medicaid Other | Admitting: Physician Assistant

## 2017-04-12 ENCOUNTER — Encounter (INDEPENDENT_AMBULATORY_CARE_PROVIDER_SITE_OTHER): Payer: Self-pay | Admitting: Physician Assistant

## 2017-04-12 DIAGNOSIS — M65341 Trigger finger, right ring finger: Secondary | ICD-10-CM | POA: Diagnosis not present

## 2017-04-12 DIAGNOSIS — M1711 Unilateral primary osteoarthritis, right knee: Secondary | ICD-10-CM | POA: Insufficient documentation

## 2017-04-12 MED ORDER — BUPIVACAINE HCL 0.25 % IJ SOLN
4.0000 mL | INTRAMUSCULAR | Status: AC | PRN
  Administered 2017-04-12: 4 mL via INTRA_ARTICULAR

## 2017-04-12 MED ORDER — LIDOCAINE HCL 1 % IJ SOLN
0.3000 mL | INTRAMUSCULAR | Status: AC | PRN
  Administered 2017-04-12: .3 mL

## 2017-04-12 MED ORDER — BUPIVACAINE HCL 0.25 % IJ SOLN
0.3300 mL | INTRAMUSCULAR | Status: AC | PRN
  Administered 2017-04-12: .33 mL

## 2017-04-12 MED ORDER — METHYLPREDNISOLONE ACETATE 40 MG/ML IJ SUSP
40.0000 mg | INTRAMUSCULAR | Status: AC | PRN
  Administered 2017-04-12: 40 mg via INTRA_ARTICULAR

## 2017-04-12 MED ORDER — HYDROCODONE-ACETAMINOPHEN 5-325 MG PO TABS: 1.0000 | ORAL_TABLET | Freq: Four times a day (QID) | ORAL | 0 refills | Status: DC | PRN

## 2017-04-12 NOTE — Addendum Note (Signed)
Addended by: Erskine Emery on: 04/12/2017 05:20 PM   Modules accepted: Level of Service

## 2017-04-12 NOTE — Progress Notes (Addendum)
   Procedure Note  [name]             Date of Birth: 15-Oct-1960           MRN: [id number]             Visit Date: 04/12/2017 History of present illness Mr. Jason Stokes is known to Dr. Trevor Stokes service comes in today due to right knee pain and a new complaint of a right ring finger that is triggering. His last given an injection in his right knee on 11/21/2016 and did well until 2 weeks ago. The finger started triggering about 2 weeks ago. Right knee with some swelling. He is wearing a knee brace at times which seems to help some. He has had no new injury to the knee. Physical exam right knee: No effusion no abnormal warmth no erythema. It is tenderness along the medial lateral joint line. No instability valgus varus stressing. Range of motion of the knee. Right ring finger with active triggering. Tenderness over the A1 pulley where there is palpable nodule. No other fingers on the right hand trigger. Procedures: Visit Diagnoses: Arthritis of knee, right  Trigger ring finger of right hand  Large Joint Inj Date/Time: 04/12/2017 5:10 PM Performed by: Jason Stokes Authorized by: Jason Stokes   Consent Given by:  Patient Indications:  Pain Location:  Knee Site:  R knee Needle Size:  25 G Approach:  Anterolateral Ultrasound Guidance: No   Fluoroscopic Guidance: No   Medications:  40 mg methylPREDNISolone acetate 40 MG/ML; 4 mL bupivacaine 0.25 % Aspiration Attempted: No   Patient tolerance:  Patient tolerated the procedure well with no immediate complications Hand/UE Inj Date/Time: 04/12/2017 5:11 PM Performed by: Jason Stokes Authorized by: Jason Stokes   Condition: trigger finger   Medications:  0.3 mL lidocaine 1 %; 0.33 mL bupivacaine 0.25 %   Plan: He'll follow up on an as-needed basis pain persist or becomes worse. Questions encouraged and answered. Norco prescription was given for pain EC uses sparingly.

## 2017-06-19 ENCOUNTER — Telehealth: Payer: Self-pay | Admitting: Cardiology

## 2017-06-19 NOTE — Telephone Encounter (Signed)
Mr. Vanhorn is calling because he is needing his Diltiazem increased  From 300 mg to 310 mg in order for his insurance will pay for it . He has not had any medication for 3 Weeks now. Also states that he is using [facility] on [address] .

## 2017-06-19 NOTE — Telephone Encounter (Signed)
Sent to Dr. Curt Bears for approval.

## 2017-06-21 MED ORDER — DILTIAZEM HCL ER COATED BEADS 360 MG PO CP24: 360.0000 mg | ORAL_CAPSULE | Freq: Every day | ORAL | 6 refills | Status: DC

## 2017-06-21 NOTE — Telephone Encounter (Signed)
Advised pt that medication dosage would increase to 360 mg, per Dr. Curt Bears. Rx sent to pharmacy. Patient verbalized understanding and agreeable to plan.

## 2017-06-21 NOTE — Telephone Encounter (Signed)
F/U call: ° °Patient returning your call  °

## 2017-06-29 ENCOUNTER — Other Ambulatory Visit: Payer: Self-pay | Admitting: Pharmacist

## 2017-06-29 MED ORDER — OMEPRAZOLE 20 MG PO CPDR: 20.0000 mg | DELAYED_RELEASE_CAPSULE | Freq: Every day | ORAL | 0 refills | Status: DC

## 2017-07-03 ENCOUNTER — Ambulatory Visit: Payer: Medicaid Other | Admitting: Family Medicine

## 2017-07-12 DIAGNOSIS — M545 Low back pain, unspecified: Secondary | ICD-10-CM | POA: Insufficient documentation

## 2017-07-12 DIAGNOSIS — F419 Anxiety disorder, unspecified: Secondary | ICD-10-CM | POA: Insufficient documentation

## 2017-07-12 DIAGNOSIS — G8929 Other chronic pain: Secondary | ICD-10-CM | POA: Insufficient documentation

## 2017-07-12 DIAGNOSIS — K219 Gastro-esophageal reflux disease without esophagitis: Secondary | ICD-10-CM | POA: Insufficient documentation

## 2017-07-12 DIAGNOSIS — I639 Cerebral infarction, unspecified: Secondary | ICD-10-CM | POA: Insufficient documentation

## 2017-07-12 DIAGNOSIS — I739 Peripheral vascular disease, unspecified: Secondary | ICD-10-CM | POA: Insufficient documentation

## 2017-07-12 DIAGNOSIS — G43909 Migraine, unspecified, not intractable, without status migrainosus: Secondary | ICD-10-CM | POA: Insufficient documentation

## 2017-07-12 DIAGNOSIS — I499 Cardiac arrhythmia, unspecified: Secondary | ICD-10-CM | POA: Insufficient documentation

## 2017-07-12 DIAGNOSIS — F209 Schizophrenia, unspecified: Secondary | ICD-10-CM | POA: Insufficient documentation

## 2017-07-12 DIAGNOSIS — F1011 Alcohol abuse, in remission: Secondary | ICD-10-CM | POA: Insufficient documentation

## 2017-07-12 DIAGNOSIS — Z9289 Personal history of other medical treatment: Secondary | ICD-10-CM | POA: Insufficient documentation

## 2017-07-20 ENCOUNTER — Ambulatory Visit: Payer: Medicaid Other | Attending: Family Medicine | Admitting: Family Medicine

## 2017-07-20 ENCOUNTER — Encounter: Payer: Self-pay | Admitting: Family Medicine

## 2017-07-20 VITALS — BP 136/85 | HR 99 | Temp 98.0°F | Ht 73.0 in | Wt 250.8 lb

## 2017-07-20 DIAGNOSIS — M25561 Pain in right knee: Secondary | ICD-10-CM | POA: Diagnosis not present

## 2017-07-20 DIAGNOSIS — Z23 Encounter for immunization: Secondary | ICD-10-CM

## 2017-07-20 DIAGNOSIS — E119 Type 2 diabetes mellitus without complications: Secondary | ICD-10-CM | POA: Diagnosis not present

## 2017-07-20 DIAGNOSIS — Z7901 Long term (current) use of anticoagulants: Secondary | ICD-10-CM | POA: Diagnosis not present

## 2017-07-20 DIAGNOSIS — I10 Essential (primary) hypertension: Secondary | ICD-10-CM

## 2017-07-20 DIAGNOSIS — F209 Schizophrenia, unspecified: Secondary | ICD-10-CM | POA: Diagnosis not present

## 2017-07-20 DIAGNOSIS — K219 Gastro-esophageal reflux disease without esophagitis: Secondary | ICD-10-CM | POA: Insufficient documentation

## 2017-07-20 DIAGNOSIS — M5441 Lumbago with sciatica, right side: Secondary | ICD-10-CM | POA: Diagnosis not present

## 2017-07-20 DIAGNOSIS — F419 Anxiety disorder, unspecified: Secondary | ICD-10-CM | POA: Insufficient documentation

## 2017-07-20 DIAGNOSIS — J0100 Acute maxillary sinusitis, unspecified: Secondary | ICD-10-CM

## 2017-07-20 DIAGNOSIS — Z79899 Other long term (current) drug therapy: Secondary | ICD-10-CM | POA: Diagnosis not present

## 2017-07-20 DIAGNOSIS — Z8673 Personal history of transient ischemic attack (TIA), and cerebral infarction without residual deficits: Secondary | ICD-10-CM | POA: Diagnosis not present

## 2017-07-20 DIAGNOSIS — I480 Paroxysmal atrial fibrillation: Secondary | ICD-10-CM

## 2017-07-20 DIAGNOSIS — F3160 Bipolar disorder, current episode mixed, unspecified: Secondary | ICD-10-CM | POA: Diagnosis not present

## 2017-07-20 DIAGNOSIS — F3161 Bipolar disorder, current episode mixed, mild: Secondary | ICD-10-CM

## 2017-07-20 DIAGNOSIS — G8929 Other chronic pain: Secondary | ICD-10-CM | POA: Diagnosis not present

## 2017-07-20 MED ORDER — BENZONATATE 100 MG PO CAPS: 100.0000 mg | ORAL_CAPSULE | Freq: Two times a day (BID) | ORAL | 0 refills | Status: DC | PRN

## 2017-07-20 MED ORDER — AMOXICILLIN 500 MG PO CAPS: 500.0000 mg | ORAL_CAPSULE | Freq: Three times a day (TID) | ORAL | 0 refills | Status: DC

## 2017-07-20 NOTE — Progress Notes (Signed)
Subjective:  Patient ID: Jason Stokes, male    DOB: 1960-12-29  Age: 57 y.o. MRN: [id number]  CC: Hypertension   HPI Jason Stokes  is a 57 year old male with a history of hypertension, type 2 diabetes mellitus (A1c 5.7), bipolar disorder, paroxysmal A. fib (previous AF ablation)who presents to the clinic for a follow-up visit.  He complains of severe back pain which he rates at a 14-15/10, radiating down his right lower extremities with associated numbness in his extremities and has been using a back brace with no improvement in symptoms.  He informs me he was being seen by pain management in [place] and evaluated for spine stimulator but this fell through. I had referred him to pain management at his last visit however he is yet to hear from them. He declined prescriptions for muscle relaxants, tramadol Tylenol No. 3 at his last visit as he states they do not work.  He also has right knee pain and was seen by [facility] in 03/2017 where he received a right knee and right ring trigger finger injection but states he has had no improvement in symptoms.  He complains of sinus congestion, sinus tenderness, cough which is productive of white sputum and keeps him up at night.  He has also noticed some wheezing and shortness of breath but denies fevers. He does have chronic sinusitis and has been taking his Flonase, Zyrtec and Singulair with no relief in symptoms.  Atrial fibrillation is managed by cardiology and he has an upcoming appointment.  He endorses compliance with his medications and denies adverse effects. Bipolar disorder is managed by mental health.  Past Medical History:  Diagnosis Date  . Anxiety   . Bipolar disorder ([place])   . Chronic lower back pain   . Depression   . Dysrhythmia    a-fib  . GERD (gastroesophageal reflux disease)   . History of alcohol abuse   . History of nuclear stress test    Myoview 10/16: EF 50%, diaphragmatic attenuation, no  ischemia, low risk  . Hypertension   . Migraine 2012-2014  . PAF (paroxysmal atrial fibrillation) ([place]) 03/27/2015   a. Myoview neg for ischemia >> Flecainide started 10/16 >> FU ETT   . Schizophrenia ([place])   . Sciatica neuralgia   . Small vessel disease    Right basal ganglia stroke  . Stroke [facility]) "between 2012-2014"   residual "AF" (09/22/2015)    Past Surgical History:  Procedure Laterality Date  . ATRIAL FIBRILLATION ABLATION  09/22/2015  . CYST EXCISION  1996-97   surgery back of head   . ELECTROPHYSIOLOGIC STUDY N/A 09/22/2015   Procedure: Atrial Fibrillation Ablation;  Surgeon: Will [name], MD;  Location: [place] [facility];  Service: Cardiovascular;  Laterality: N/A;  . ELECTROPHYSIOLOGIC STUDY N/A 12/10/2015   Procedure: Atrial Fibrillation Ablation;  Surgeon: Will [name], MD;  Location: [place] [facility];  Service: Cardiovascular;  Laterality: N/A;  . ELECTROPHYSIOLOGIC STUDY N/A 12/11/2015   Procedure: Cardioversion;  Surgeon: Will [name], MD;  Location: [place] [facility];  Service: Cardiovascular;  Laterality: N/A;  . EXCISION MASS HEAD N/A 01/06/2017   Procedure: EXCISION MASS FOREHEAD;  Surgeon: Irene Limbo, MD;  Location: [place];  Service: Plastics;  Laterality: N/A;  . GANGLION CYST EXCISION Left   . INTERCOSTAL NERVE BLOCK  2005  . KNEE ARTHROSCOPY Right 2016    Allergies  Allergen Reactions  . Lisinopril Anaphylaxis    Swelling of lips  and tongue.    Outpatient Medications Prior to Visit  Medication Sig Dispense Refill  . carbamazepine (TEGRETOL) 200 MG tablet Take 200 mg by mouth 2 (two) times daily.    . carvedilol (COREG) 12.5 MG tablet Take 1 tablet (12.5 mg total) by mouth 2 (two) times daily. 60 tablet 3  . CYMBALTA 60 MG capsule Take 60 mg by mouth daily.  1  . diphenhydrAMINE (BENADRYL) 25 mg capsule TAKE 2 TABLETS (50 MG TOTAL) BY MOUTH EVERY 6 (SIX) HOURS AS NEEDED FOR ALLERGIES. 60 capsule 0  . ELIQUIS 5  MG TABS tablet TAKE 1 TABLET (5 MG TOTAL) BY MOUTH 2 (TWO) TIMES DAILY. 60 tablet 5  . fluticasone (FLONASE) 50 MCG/ACT nasal spray Place 2 sprays into both nostrils daily. 16 g 3  . levETIRAcetam (KEPPRA) 250 MG tablet Take 500 mg by mouth 2 (two) times daily.    . montelukast (SINGULAIR) 10 MG tablet Take 1 tablet (10 mg total) by mouth at bedtime. 30 tablet 3  . omeprazole (PRILOSEC) 20 MG capsule Take 1 capsule (20 mg total) by mouth daily. 30 capsule 0  . traZODone (DESYREL) 100 MG tablet Take 100-200 mg by mouth at bedtime.  2  . triamcinolone (NASACORT AQ) 55 MCG/ACT AERO nasal inhaler Place 2 sprays into the nose daily. 1 Inhaler 12  . triamcinolone ointment (KENALOG) 0.5 % Apply 1 application topically 2 (two) times daily. 30 g 0  . VOLTAREN 1 % GEL Apply 1 application topically 4 (four) times daily.  2  . diltiazem (CARDIZEM CD) 360 MG 24 hr capsule Take 1 capsule (360 mg total) by mouth daily. (Patient not taking: Reported on 07/20/2017) 30 capsule 6  . hydrALAZINE (APRESOLINE) 100 MG tablet Take 1 tablet (100 mg total) by mouth 3 (three) times daily. 270 tablet 3  . HYDROcodone-acetaminophen (NORCO) 5-325 MG tablet Take 1-2 tablets by mouth every 6 (six) hours as needed for moderate pain. One to two tabs every 4-6 hours for pain (Patient not taking: Reported on 07/20/2017) 30 tablet 0  . ketotifen (ZADITOR) 0.025 % ophthalmic solution Place 1 drop into both eyes 2 (two) times daily. (Patient not taking: Reported on 07/20/2017) 10 mL 1  . olopatadine (PATANOL) 0.1 % ophthalmic solution Place 1 drop into both eyes 2 (two) times daily. (Patient not taking: Reported on 07/20/2017) 5 mL 5  . oxyCODONE-acetaminophen (ROXICET) 5-325 MG tablet Take 1-2 tablets by mouth every 4 (four) hours as needed for severe pain. (Patient not taking: Reported on 04/12/2017) 20 tablet 0   No facility-administered medications prior to visit.     ROS Review of Systems  Constitutional: Negative for activity change and  appetite change.  HENT: Positive for congestion, postnasal drip, sinus pressure and sinus pain. Negative for sore throat.   Eyes: Negative for visual disturbance.  Respiratory: Positive for cough and wheezing. Negative for chest tightness and shortness of breath.   Cardiovascular: Negative for chest pain and leg swelling.  Gastrointestinal: Negative for abdominal distention, abdominal pain, constipation and diarrhea.  Endocrine: Negative.   Genitourinary: Negative for dysuria.  Musculoskeletal:       See hpi  Skin: Negative for rash.  Allergic/Immunologic: Negative.   Neurological: Positive for numbness. Negative for weakness and light-headedness.  Psychiatric/Behavioral: Negative for dysphoric mood and suicidal ideas.    Objective:  BP 136/85   Pulse 99   Temp 98 F (36.7 C) (Oral)   Ht 6\' 1"  (1.854 m)   Wt 250 lb 12.8  oz (113.8 kg)   SpO2 98%   BMI 33.09 kg/m   BP/Weight 07/20/2017 04/03/2017 2/67/1245  Systolic BP 809 983 382  Diastolic BP 85 87 90  Wt. (Lbs) 250.8 261.8 265.8  BMI 33.09 34.54 35.07      Physical Exam  Constitutional: He is oriented to person, place, and time. He appears well-developed and well-nourished.  HENT:  Right Ear: External ear normal.  Left Ear: External ear normal.  Mouth/Throat: Oropharynx is clear and moist.  Bilateral maxillary sinus tenderness   Cardiovascular: Normal rate, normal heart sounds and intact distal pulses.  No murmur heard. Pulmonary/Chest: Effort normal and breath sounds normal. He has no wheezes. He has no rales. He exhibits no tenderness.  Abdominal: Soft. Bowel sounds are normal. He exhibits no distension and no mass. There is no tenderness.  Musculoskeletal:  Tenderness to palpation of lumbar spine, unable to lie supine due to pain Crepitus and tenderness on range of motion of right knee; no right knee edema  Neurological: He is alert and oriented to person, place, and time.  Skin: Skin is warm and dry.    Psychiatric: He has a normal mood and affect.     Assessment & Plan:   1. Paroxysmal atrial fibrillation (HCC) Status post previous AF ablation In sinus rhythm Continue anticoagulation with Eliquis Rate control with Cardizem  2. Essential hypertension Controlled Continue hydralazine, carvedilol Low-sodium, DASH diet  3. Chronic pain of right knee Uncontrolled Status post right knee cortisone injection Advised to follow-up with orthopedics given symptoms are not controlled  4. Bipolar disorder, current episode mixed, mild (HCC) Continue Tegretol Keep appointment with mental health  5. Chronic midline low back pain with right-sided sciatica Uncontrolled Declines prescription of muscle relaxant, tramadol or Tylenol No. 3 We will follow-up with referral coordinator regarding his referral to pain management We will also help with application for PCS services   6. Need for influenza vaccination - Flu Vaccine QUAD 36+ mos IM  7. Acute non-recurrent maxillary sinusitis - amoxicillin (AMOXIL) 500 MG capsule; Take 1 capsule (500 mg total) by mouth 3 (three) times daily.  Dispense: 30 capsule; Refill: 0 - benzonatate (TESSALON) 100 MG capsule; Take 1 capsule (100 mg total) by mouth 2 (two) times daily as needed for cough.  Dispense: 20 capsule; Refill: 0   Meds ordered this encounter  Medications  . amoxicillin (AMOXIL) 500 MG capsule    Sig: Take 1 capsule (500 mg total) by mouth 3 (three) times daily.    Dispense:  30 capsule    Refill:  0  . benzonatate (TESSALON) 100 MG capsule    Sig: Take 1 capsule (100 mg total) by mouth 2 (two) times daily as needed for cough.    Dispense:  20 capsule    Refill:  0    Follow-up: Return in about 3 months (around 10/18/2017) for Follow-up of chronic medical conditions.   Arnoldo Morale MD

## 2017-07-20 NOTE — Patient Instructions (Signed)

## 2017-07-24 ENCOUNTER — Encounter (INDEPENDENT_AMBULATORY_CARE_PROVIDER_SITE_OTHER): Payer: Self-pay | Admitting: Physician Assistant

## 2017-07-24 ENCOUNTER — Ambulatory Visit (INDEPENDENT_AMBULATORY_CARE_PROVIDER_SITE_OTHER): Payer: Medicaid Other | Admitting: Physician Assistant

## 2017-07-24 ENCOUNTER — Telehealth: Payer: Self-pay

## 2017-07-24 ENCOUNTER — Ambulatory Visit (INDEPENDENT_AMBULATORY_CARE_PROVIDER_SITE_OTHER): Payer: Medicaid Other

## 2017-07-24 VITALS — Ht 73.0 in | Wt 250.0 lb

## 2017-07-24 DIAGNOSIS — M1711 Unilateral primary osteoarthritis, right knee: Secondary | ICD-10-CM | POA: Diagnosis not present

## 2017-07-24 DIAGNOSIS — M25561 Pain in right knee: Secondary | ICD-10-CM

## 2017-07-24 MED ORDER — METHYLPREDNISOLONE ACETATE 40 MG/ML IJ SUSP
40.0000 mg | INTRAMUSCULAR | Status: AC | PRN
  Administered 2017-07-24: 40 mg via INTRA_ARTICULAR

## 2017-07-24 MED ORDER — LIDOCAINE HCL 1 % IJ SOLN
3.0000 mL | INTRAMUSCULAR | Status: AC | PRN
  Administered 2017-07-24: 3 mL

## 2017-07-24 MED ORDER — HYDROCODONE-ACETAMINOPHEN 5-325 MG PO TABS: 1.0000 | ORAL_TABLET | Freq: Four times a day (QID) | ORAL | 0 refills | Status: DC | PRN

## 2017-07-24 NOTE — Telephone Encounter (Signed)
PCS form faxed to [facility] - fax # [phone] 

## 2017-07-24 NOTE — Progress Notes (Signed)
Office Visit Note   [name]           Date of Birth: [birth year]           MRN: [id number] Visit Date: 07/24/2017              Requested by: Arnoldo Morale, MD [place], [place] [address] PCP: Arnoldo Morale, MD   Assessment & Plan: Visit Diagnoses:  1. Acute pain of right knee   2. 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  He will follow with [place] on an as-needed basis pain persist or becomes worse.  He understands that he can have cortisone injections in the right knee no 3 months.  He is to use the pain medicine was given the base sparingly.  Tramadol does not give him any relief  Follow-Up Instructions: Return if symptoms worsen or fail to improve.   Orders:  Orders Placed This Encounter  Procedures  . Large Joint Inj  . XR Knee 1-2 Views Right   Meds ordered this encounter  Medications  . HYDROcodone-acetaminophen (NORCO) 5-325 MG tablet    Sig: Take 1-2 tablets by mouth every 6 (six) hours as needed for moderate pain. One to two tabs every 4-6 hours for pain    Dispense:  20 tablet    Refill:  0      Procedures: No procedures performed   Clinical Data: No additional findings.   Subjective: Chief Complaint  Patient presents with  . Right Knee - Pain    HPI [name] returns today due to right knee pain.  He tripped over the family dog on 07/12/2017 and fell onto his right knee.  He is having ambulating with a cane.  Has had popping has been worse in the knee since the fall.  He has known moderate arthritis of his right knee.  States that his knee had been doing well until the recent fall. Review of Systems No fevers chills shortness of breath chest pain.  Otherwise please see HPI.  Objective: Vital Signs: Ht 6\' 1"  (1.854 m)   Wt 250 lb (113.4 kg)   BMI 32.98 kg/m   Physical Exam  Constitutional: He is oriented to person, place, and time. He appears well-developed and well-nourished. No  distress.  Pulmonary/Chest: Effort normal.  Neurological: He is alert and oriented to person, place, and time.  Skin: He is not diaphoretic.  Psychiatric: He has a normal mood and affect.    Ortho Exam Right knee slight edema no effusion.  Good range of motion of the knee overall.  No instability valgus varus stressing.  He has tenderness along medial joint line.  Negative McMurray's. Specialty Comments:  No specialty comments available.  Imaging: Xr Knee 1-2 Views Right  Result Date: 07/24/2017 Right knee 2 views: No acute fracture.  Moderate medial compartmental narrowing.  Mild patellofemoral changes.  Lateral compartment is well-maintained.  He is well located.    PMFS History: Patient Active Problem List   Diagnosis Date Noted  . Stroke ([place])   . Small vessel disease   . Schizophrenia ([place])   . Migraine   . History of nuclear stress test   . History of alcohol abuse   . GERD (gastroesophageal reflux disease)   . Dysrhythmia   . Chronic lower back pain   . Anxiety   . Arthritis of knee, right 04/12/2017  . Bipolar disorder ([place]) 04/03/2017  .  Lipoma of forehead 09/22/2016  . Trigger ring finger of right hand 06/22/2016  . Paroxysmal atrial fibrillation (HCC)   . AF (atrial fibrillation) ([place]) 12/10/2015  . Allergic rhinoconjunctivitis of left eye 07/10/2015  . Eczema 07/10/2015  . Sweating 07/10/2015  . History of CVA (cerebrovascular accident) 05/04/2015  . PAF (paroxysmal atrial fibrillation) ([place]) 03/27/2015  . Tear of medial meniscus of right knee 03/18/2015  . Chronic pain of right knee 03/12/2015  . Other and unspecified hyperlipidemia 11/25/2013  . Nasal congestion 11/25/2013  . Sinusitis, chronic 05/09/2013  . Chronic low back pain 10/12/2012  . Sciatica neuralgia 10/12/2012  . Hypertension 10/12/2012  . Depression 10/12/2012  . Insomnia 10/12/2012   Past Medical History:  Diagnosis Date  . Anxiety   . Bipolar disorder ([place])   . Chronic lower back  pain   . Depression   . Dysrhythmia    a-fib  . GERD (gastroesophageal reflux disease)   . History of alcohol abuse   . History of nuclear stress test    Myoview 10/16: EF 50%, diaphragmatic attenuation, no ischemia, low risk  . Hypertension   . Migraine 2012-2014  . PAF (paroxysmal atrial fibrillation) ([place]) 03/27/2015   a. Myoview neg for ischemia >> Flecainide started 10/16 >> FU ETT   . Schizophrenia ([place])   . Sciatica neuralgia   . Small vessel disease    Right basal ganglia stroke  . Stroke [facility]) "between 2012-2014"   residual "AF" (09/22/2015)    Family History  Problem Relation Age of Onset  . Heart disease Father   . Schizophrenia Sister     Past Surgical History:  Procedure Laterality Date  . ATRIAL FIBRILLATION ABLATION  09/22/2015  . CYST EXCISION  1996-97   surgery back of head   . ELECTROPHYSIOLOGIC STUDY N/A 09/22/2015   Procedure: Atrial Fibrillation Ablation;  Surgeon: Will [name], MD;  Location: [place] [facility];  Service: Cardiovascular;  Laterality: N/A;  . ELECTROPHYSIOLOGIC STUDY N/A 12/10/2015   Procedure: Atrial Fibrillation Ablation;  Surgeon: Will [name], MD;  Location: [place] [facility];  Service: Cardiovascular;  Laterality: N/A;  . ELECTROPHYSIOLOGIC STUDY N/A 12/11/2015   Procedure: Cardioversion;  Surgeon: Will [name], MD;  Location: [place] [facility];  Service: Cardiovascular;  Laterality: N/A;  . EXCISION MASS HEAD N/A 01/06/2017   Procedure: EXCISION MASS FOREHEAD;  Surgeon: Irene Limbo, MD;  Location: [place];  Service: Plastics;  Laterality: N/A;  . GANGLION CYST EXCISION Left   . INTERCOSTAL NERVE BLOCK  2005  . KNEE ARTHROSCOPY Right 2016   Social History   Occupational History    Comment: disabled  Tobacco Use  . Smoking status: Current Some Day Smoker    Packs/day: 0.25    Years: 29.00    Pack years: 7.25    Types: Cigarettes    Last attempt to quit: 08/19/2015    Years since quitting:  1.9  . Smokeless tobacco: Never Used  Substance and Sexual Activity  . Alcohol use: Yes    Alcohol/week: 0.0 oz    Comment: drinks social  . Drug use: Yes    Types: Marijuana    Comment: smoked marijuana 01-02-17 for pain  . Sexual activity: Not Currently

## 2017-07-24 NOTE — Progress Notes (Signed)
   Procedure Note  [name]             Date of Birth: [birth year]           MRN: [id number]             Visit Date: 07/24/2017    Procedures: Visit Diagnoses: Acute pain of right knee - Plan: XR Knee 1-2 Views Right  Arthritis of knee, right  Large Joint Inj on 07/24/2017 11:48 AM Indications: pain Details: 22 G 1.5 in needle, anterolateral approach  Arthrogram: No  Medications: 3 mL lidocaine 1 %; 40 mg methylPREDNISolone acetate 40 MG/ML Outcome: tolerated well, no immediate complications Procedure, treatment alternatives, risks and benefits explained, specific risks discussed. Consent was given by the patient. Immediately prior to procedure a time out was called to verify the correct patient, procedure, equipment, support staff and site/side marked as required. Patient was prepped and draped in the usual sterile fashion.

## 2017-07-25 ENCOUNTER — Other Ambulatory Visit: Payer: Medicaid Other

## 2017-07-26 ENCOUNTER — Telehealth: Payer: Self-pay

## 2017-07-26 NOTE — Telephone Encounter (Signed)
Call placed to [facility], spoke to [place] and confirmed receipt of the referral. Assessment scheduled for 07/27/17 @1430 

## 2017-07-31 ENCOUNTER — Other Ambulatory Visit: Payer: Self-pay | Admitting: Family Medicine

## 2017-08-02 ENCOUNTER — Encounter: Payer: Self-pay | Admitting: Cardiology

## 2017-08-02 ENCOUNTER — Ambulatory Visit (INDEPENDENT_AMBULATORY_CARE_PROVIDER_SITE_OTHER): Payer: Medicaid Other | Admitting: Cardiology

## 2017-08-02 VITALS — BP 122/84 | HR 69 | Ht 73.0 in | Wt 252.0 lb

## 2017-08-02 DIAGNOSIS — I481 Persistent atrial fibrillation: Secondary | ICD-10-CM | POA: Diagnosis not present

## 2017-08-02 DIAGNOSIS — I10 Essential (primary) hypertension: Secondary | ICD-10-CM | POA: Diagnosis not present

## 2017-08-02 DIAGNOSIS — I4819 Other persistent atrial fibrillation: Secondary | ICD-10-CM

## 2017-08-02 DIAGNOSIS — R55 Syncope and collapse: Secondary | ICD-10-CM

## 2017-08-02 NOTE — Progress Notes (Signed)
Electrophysiology Office Note   Date:  08/02/2017   ID:  Jason Stokes, DOB 02-01-1961, MRN [id number]  PCP:  Arnoldo Morale, MD  Cardiologist:  [name] Primary Electrophysiologist: Aprile Dickenson [name], MD    Chief Complaint  Patient presents with  . Follow-up    PAF     History of Present Illness: Jason Stokes is a 57 y.o. male who presents today for electrophysiology evaluation.   He is a history of paroxysmal atrial fibrillation, hypertension and remote CVA. He presented to the emergency room with atrial fibrillation and RVR. He was started on Eliquis and diltiazem at that time. He did not follow-up with cardiology until 03/27/15 when he complained of palpitations. He had AF ablation performed on 09/22/15. Unfortunately during his procedure, it was noted that he had a clot in his left atrium and the procedure was therefore canceled. Had repeat ablation 12/10/15. He had a stress test performed after initiation of flecainide. He had ventricular ectopy with nonsustained VT.  Today, denies symptoms of palpitations, chest pain, shortness of breath, orthopnea, PND, lower extremity edema, claudication, dizziness, presyncope, syncope, bleeding, or neurologic sequela. The patient is tolerating medications without difficulties.  He is feeling well without major issues.  No further episodes of atrial fibrillation.   Past Medical History:  Diagnosis Date  . Anxiety   . Bipolar disorder ([place])   . Chronic lower back pain   . Depression   . Dysrhythmia    a-fib  . GERD (gastroesophageal reflux disease)   . History of alcohol abuse   . History of nuclear stress test    Myoview 10/16: EF 50%, diaphragmatic attenuation, no ischemia, low risk  . Hypertension   . Migraine 2012-2014  . PAF (paroxysmal atrial fibrillation) ([place]) 03/27/2015   a. Myoview neg for ischemia >> Flecainide started 10/16 >> FU ETT   . Schizophrenia ([place])   . Sciatica neuralgia   . Small vessel disease    Right  basal ganglia stroke  . Stroke [facility]) "between 2012-2014"   residual "AF" (09/22/2015)   Past Surgical History:  Procedure Laterality Date  . ATRIAL FIBRILLATION ABLATION  09/22/2015  . CYST EXCISION  1996-97   surgery back of head   . ELECTROPHYSIOLOGIC STUDY N/A 09/22/2015   Procedure: Atrial Fibrillation Ablation;  Surgeon: Hildagarde Holleran [name], MD;  Location: [place] [facility];  Service: Cardiovascular;  Laterality: N/A;  . ELECTROPHYSIOLOGIC STUDY N/A 12/10/2015   Procedure: Atrial Fibrillation Ablation;  Surgeon: Heaton Sarin [name], MD;  Location: [place] [facility];  Service: Cardiovascular;  Laterality: N/A;  . ELECTROPHYSIOLOGIC STUDY N/A 12/11/2015   Procedure: Cardioversion;  Surgeon: Dontrelle Mazon [name], MD;  Location: [place] [facility];  Service: Cardiovascular;  Laterality: N/A;  . EXCISION MASS HEAD N/A 01/06/2017   Procedure: EXCISION MASS FOREHEAD;  Surgeon: Irene Limbo, MD;  Location: [place];  Service: Plastics;  Laterality: N/A;  . GANGLION CYST EXCISION Left   . INTERCOSTAL NERVE BLOCK  2005  . KNEE ARTHROSCOPY Right 2016     Current Outpatient Medications  Medication Sig Dispense Refill  . amoxicillin (AMOXIL) 500 MG capsule Take 1 capsule (500 mg total) by mouth 3 (three) times daily. 30 capsule 0  . benzonatate (TESSALON) 100 MG capsule Take 1 capsule (100 mg total) by mouth 2 (two) times daily as needed for cough. 20 capsule 0  . carbamazepine (TEGRETOL) 200 MG tablet Take 200 mg by mouth 2 (two) times daily.    Marland Kitchen  carvedilol (COREG) 12.5 MG tablet Take 1 tablet (12.5 mg total) by mouth 2 (two) times daily. 60 tablet 3  . CYMBALTA 60 MG capsule Take 60 mg by mouth daily.  1  . diltiazem (CARDIZEM CD) 360 MG 24 hr capsule Take 1 capsule (360 mg total) by mouth daily. 30 capsule 6  . diphenhydrAMINE (BENADRYL) 25 mg capsule TAKE 2 TABLETS (50 MG TOTAL) BY MOUTH EVERY 6 (SIX) HOURS AS NEEDED FOR ALLERGIES. 60 capsule 0  . ELIQUIS 5 MG TABS tablet  TAKE 1 TABLET (5 MG TOTAL) BY MOUTH 2 (TWO) TIMES DAILY. 60 tablet 5  . fluticasone (FLONASE) 50 MCG/ACT nasal spray Place 2 sprays into both nostrils daily. 16 g 3  . hydrALAZINE (APRESOLINE) 100 MG tablet Take 1 tablet (100 mg total) by mouth 3 (three) times daily. 270 tablet 3  . HYDROcodone-acetaminophen (NORCO) 5-325 MG tablet Take 1-2 tablets by mouth every 6 (six) hours as needed for moderate pain. One to two tabs every 4-6 hours for pain 20 tablet 0  . ketotifen (ZADITOR) 0.025 % ophthalmic solution Place 1 drop into both eyes 2 (two) times daily. 10 mL 1  . levETIRAcetam (KEPPRA) 250 MG tablet Take 500 mg by mouth 2 (two) times daily.    . montelukast (SINGULAIR) 10 MG tablet Take 1 tablet (10 mg total) by mouth at bedtime. 30 tablet 3  . olopatadine (PATANOL) 0.1 % ophthalmic solution Place 1 drop into both eyes 2 (two) times daily. 5 mL 5  . omeprazole (PRILOSEC) 20 MG capsule TAKE 1 CAPSULE BY MOUTH EVERY DAY 90 capsule 0  . oxyCODONE-acetaminophen (ROXICET) 5-325 MG tablet Take 1-2 tablets by mouth every 4 (four) hours as needed for severe pain. 20 tablet 0  . traZODone (DESYREL) 100 MG tablet Take 100-200 mg by mouth at bedtime.  2  . triamcinolone (NASACORT AQ) 55 MCG/ACT AERO nasal inhaler Place 2 sprays into the nose daily. 1 Inhaler 12  . triamcinolone ointment (KENALOG) 0.5 % Apply 1 application topically 2 (two) times daily. 30 g 0  . VOLTAREN 1 % GEL Apply 1 application topically 4 (four) times daily.  2   No current facility-administered medications for this visit.     Allergies:   Lisinopril   Social History:  The patient  reports that he has been smoking cigarettes.  He has a 7.25 pack-year smoking history. he has never used smokeless tobacco. He reports that he drinks alcohol. He reports that he uses drugs. Drug: Marijuana.   Family History:  The patient's family history includes Heart disease in his father; Schizophrenia in his sister.   ROS:  Please see the history  of present illness.   Otherwise, review of systems is positive for pain, chest pain, back pain.   All other systems are reviewed and negative.   PHYSICAL EXAM: VS:  BP 122/84   Pulse 69   Ht 6\' 1"  (1.854 m)   Wt 252 lb (114.3 kg)   BMI 33.25 kg/m  , BMI Body mass index is 33.25 kg/m. GEN: Well nourished, well developed, in no acute distress  HEENT: normal  Neck: no JVD, carotid bruits, or masses Cardiac: RRR; no murmurs, rubs, or gallops,no edema  Respiratory:  clear to auscultation bilaterally, normal work of breathing GI: soft, nontender, nondistended, + BS MS: no deformity or atrophy  Skin: warm and dry Neuro:  Strength and sensation are intact Psych: euthymic mood, full affect  EKG:  EKG is ordered today. Personal review of  the ekg ordered shows SR, rate 69   Recent Labs: 09/13/2016: TSH 1.010 01/03/2017: Hemoglobin 13.3; Platelets 176 01/30/2017: BUN 14; Creatinine, Ser 1.25; Magnesium 2.2; Potassium 4.2; Sodium 141    Lipid Panel     Component Value Date/Time   CHOL 174 10/17/2013 1042   TRIG 180 (H) 10/17/2013 1042   HDL 32 (L) 10/17/2013 1042   CHOLHDL 5.4 10/17/2013 1042   VLDL 36 10/17/2013 1042   LDLCALC 106 (H) 10/17/2013 1042     Wt Readings from Last 3 Encounters:  08/02/17 252 lb (114.3 kg)  07/24/17 250 lb (113.4 kg)  07/20/17 250 lb 12.8 oz (113.8 kg)     Other studies Reviewed: Additional studies/ records that were reviewed today include:  SPECT 05/15/15, TTE 03/31/15 Review of the above records today demonstrates:  SPECT  The left ventricular ejection fraction is mildly decreased (45-54%).  Nuclear stress EF: 50%.  There was no ST segment deviation noted during stress.  Defect 1: Moderate sized, mild in intensity, fixed defect in the mid and basal inferior and basal inferolateral wall consistent with diaphragmatic attenuation. No ischemia noted.  This is a low risk study.  TTE - Normal LV systolic function; grade 1 diastolic dysfunction;  mildLAE.  ETT 01/26/17 - personally reviewed  Blood pressure demonstrated a normal response to exercise.  There was no ST segment deviation noted during stress.  Negative study for exercise induced ischemia.  There were frequent PVCs, ventricular couplets and ventricular salvos.   ASSESSMENT AND PLAN:  1.  Paroxysmal atrial fibrillation: Eliquis and diltiazem.  Is in sinus rhythm today and is not had further episodes of atrial fibrillation.  Did not tolerate flecainide due to VT on the treadmill test.  No further changes.  This patients CHA2DS2-VASc Score and unadjusted Ischemic Stroke Rate (% per year) is equal to 3.2 % stroke rate/year from a score of 3  Above score calculated as 1 point each if present [CHF, HTN, DM, Vascular=MI/PAD/Aortic Plaque, Age if 65-74, or Male] Above score calculated as 2 points each if present [Age > 75, or Stroke/TIA/TE]  2. Hypertension: Well-controlled today.  No changes at this time.  3. Syncope: No further episodes  Current medicines are reviewed at length with the patient today.   The patient does not have concerns regarding his medicines.  The following changes were made today: None  Labs/ tests ordered today include:  Orders Placed This Encounter  Procedures  . EKG 12-Lead     Disposition:   FU with Christa Fasig 6 months.  Signed, Rohen Kimes [name], MD  08/02/2017 11:27 AM     [facility] [address] [place] [address] [phone] (office) [phone] (fax)

## 2017-08-02 NOTE — Patient Instructions (Signed)
Medication Instructions: Your physician recommends that you continue on your current medications as directed. Please refer to the Current Medication list given to you today.    Labwork: None ordered   Procedures/Testing: None ordered  Follow-Up: Your physician wants you to follow-up in: 6 months with Dr. Curt Bears.   You will receive a reminder letter in the mail two months in advance. If you don't receive a letter, please call our office to schedule the follow-up appointment.   Any Additional Special Instructions Will Be Listed Below (If Applicable).     If you need a refill on your cardiac medications before your next appointment, please call your pharmacy.

## 2017-08-09 ENCOUNTER — Telehealth: Payer: Self-pay

## 2017-08-09 NOTE — Telephone Encounter (Signed)
Call placed to [facility] to follow up about a PCS ICD 10 transition form that was received from [facility].  Spoke to [place] who stated that this form does not need to be completed as a new PCS request was submitted in 2017-11-01 with ICD 10 codes.  She also confirmed that the patient has accepted services with [facility].  Attempted to contact [place] to notify them of above information from [facility]. Call placed to # [phone] and a HIPAA compliant voicemail message was left requesting a call back to # [phone] and ask for Opal Sidles or Venetia Night

## 2017-08-14 ENCOUNTER — Other Ambulatory Visit: Payer: Self-pay | Admitting: Pharmacist

## 2017-08-14 DIAGNOSIS — J321 Chronic frontal sinusitis: Secondary | ICD-10-CM

## 2017-08-14 MED ORDER — FLUTICASONE PROPIONATE 50 MCG/ACT NA SUSP: 2.0000 | Freq: Every day | NASAL | 0 refills | Status: DC

## 2017-08-23 ENCOUNTER — Other Ambulatory Visit (HOSPITAL_COMMUNITY): Payer: Self-pay | Admitting: Cardiology

## 2017-08-23 NOTE — Telephone Encounter (Signed)
Pt last saw Dr Curt Bears on 08/02/17, last labs 01/30/17 Creat 1.25, age 57, weight 114.3kg, based on specified criteria pt is on appropriate dosage of Eliquis 5mg  BID.  Will refill rx.

## 2017-08-29 ENCOUNTER — Encounter: Payer: Self-pay | Admitting: Family Medicine

## 2017-08-29 ENCOUNTER — Ambulatory Visit: Payer: Medicaid Other | Attending: Family Medicine | Admitting: Family Medicine

## 2017-08-29 ENCOUNTER — Telehealth: Payer: Self-pay | Admitting: Family Medicine

## 2017-08-29 VITALS — BP 151/87 | HR 76 | Temp 97.8°F | Ht 73.0 in | Wt 252.0 lb

## 2017-08-29 DIAGNOSIS — Z7901 Long term (current) use of anticoagulants: Secondary | ICD-10-CM | POA: Insufficient documentation

## 2017-08-29 DIAGNOSIS — F419 Anxiety disorder, unspecified: Secondary | ICD-10-CM | POA: Insufficient documentation

## 2017-08-29 DIAGNOSIS — I480 Paroxysmal atrial fibrillation: Secondary | ICD-10-CM | POA: Diagnosis not present

## 2017-08-29 DIAGNOSIS — F319 Bipolar disorder, unspecified: Secondary | ICD-10-CM | POA: Insufficient documentation

## 2017-08-29 DIAGNOSIS — Z79899 Other long term (current) drug therapy: Secondary | ICD-10-CM | POA: Insufficient documentation

## 2017-08-29 DIAGNOSIS — M5441 Lumbago with sciatica, right side: Secondary | ICD-10-CM | POA: Insufficient documentation

## 2017-08-29 DIAGNOSIS — Z8673 Personal history of transient ischemic attack (TIA), and cerebral infarction without residual deficits: Secondary | ICD-10-CM | POA: Insufficient documentation

## 2017-08-29 DIAGNOSIS — R05 Cough: Secondary | ICD-10-CM

## 2017-08-29 DIAGNOSIS — E119 Type 2 diabetes mellitus without complications: Secondary | ICD-10-CM | POA: Diagnosis not present

## 2017-08-29 DIAGNOSIS — G8929 Other chronic pain: Secondary | ICD-10-CM

## 2017-08-29 DIAGNOSIS — I10 Essential (primary) hypertension: Secondary | ICD-10-CM | POA: Insufficient documentation

## 2017-08-29 DIAGNOSIS — K219 Gastro-esophageal reflux disease without esophagitis: Secondary | ICD-10-CM | POA: Diagnosis not present

## 2017-08-29 DIAGNOSIS — R059 Cough, unspecified: Secondary | ICD-10-CM

## 2017-08-29 MED ORDER — LIDOCAINE 5 % EX PTCH: 1.0000 | MEDICATED_PATCH | CUTANEOUS | 1 refills | Status: DC

## 2017-08-29 MED ORDER — CETIRIZINE HCL 10 MG PO TABS: 10.0000 mg | ORAL_TABLET | Freq: Every day | ORAL | 1 refills | Status: DC

## 2017-08-29 MED ORDER — VOLTAREN 1 % TD GEL: 4.0000 g | Freq: Four times a day (QID) | TRANSDERMAL | 2 refills | Status: DC

## 2017-08-29 NOTE — Progress Notes (Signed)
Subjective:  Patient ID: Jason Stokes, male    DOB: 24-Feb-1961  Age: 57 y.o. MRN: [id number]  CC: Back Pain   HPI Jason Stokes is a 57 year old male with a history of hypertension, type 2 diabetes mellitus (A1c 5.7), bipolar disorder, paroxysmal A. fib (previous AF ablation)who presents to the clinic requesting referral to "[facility]" for management of his low back pain as they currently see him for his knee and he would like to be seen there as well for his lumbar pain.  He complains of severe back pain radiating down his right lower extremities with associated numbness in his extremities and has been using a back brace with no improvement in symptoms.  He informs me he was being seen by pain management in [place] and evaluated for a spine stimulator but this fell through. He declined prescriptions for muscle relaxants, tramadol Tylenol No. 3 at his last visit as he states they do not work. He had requested a referral to pain management which had placed in 03/2017 and review of his chart indicate he was placed in physical medicine and rehab work queue within note from 08/18/17 that this was being reviewed.  On further questioning about illicit drug use he endorses cannabis use  And states that today he is willing to receive something for pain as he needs help.  He declines muscle relaxants because they do not work, tramadol does not work either I have explained to him that tramadol and Tylenol 3 of the only narcotics be prescribed here in the clinic. He also complains of persistent cough and postnasal drip; he received amoxicillin and Tessalon Perles at his last visit for a sinus infection.  Past Medical History:  Diagnosis Date  . Anxiety   . Bipolar disorder ([place])   . Chronic lower back pain   . Depression   . Dysrhythmia    a-fib  . GERD (gastroesophageal reflux disease)   . History of alcohol abuse   . History of nuclear stress test    Myoview 10/16: EF 50%,  diaphragmatic attenuation, no ischemia, low risk  . Hypertension   . Migraine 2012-2014  . PAF (paroxysmal atrial fibrillation) ([place]) 03/27/2015   a. Myoview neg for ischemia >> Flecainide started 10/16 >> FU ETT   . Schizophrenia ([place])   . Sciatica neuralgia   . Small vessel disease    Right basal ganglia stroke  . Stroke [facility]) "between 2012-2014"   residual "AF" (09/22/2015)    Past Surgical History:  Procedure Laterality Date  . ATRIAL FIBRILLATION ABLATION  09/22/2015  . CYST EXCISION  1996-97   surgery back of head   . ELECTROPHYSIOLOGIC STUDY N/A 09/22/2015   Procedure: Atrial Fibrillation Ablation;  Surgeon: Will [name], MD;  Location: [place] [facility];  Service: Cardiovascular;  Laterality: N/A;  . ELECTROPHYSIOLOGIC STUDY N/A 12/10/2015   Procedure: Atrial Fibrillation Ablation;  Surgeon: Will [name], MD;  Location: [place] [facility];  Service: Cardiovascular;  Laterality: N/A;  . ELECTROPHYSIOLOGIC STUDY N/A 12/11/2015   Procedure: Cardioversion;  Surgeon: Will [name], MD;  Location: [place] [facility];  Service: Cardiovascular;  Laterality: N/A;  . EXCISION MASS HEAD N/A 01/06/2017   Procedure: EXCISION MASS FOREHEAD;  Surgeon: Irene Limbo, MD;  Location: [employer];  Service: Plastics;  Laterality: N/A;  . GANGLION CYST EXCISION Left   . INTERCOSTAL NERVE BLOCK  2005  . KNEE ARTHROSCOPY Right 2016     Outpatient Medications Prior  to Visit  Medication Sig Dispense Refill  . benzonatate (TESSALON) 100 MG capsule Take 1 capsule (100 mg total) by mouth 2 (two) times daily as needed for cough. 20 capsule 0  . carbamazepine (TEGRETOL) 200 MG tablet Take 200 mg by mouth 2 (two) times daily.    . carvedilol (COREG) 12.5 MG tablet Take 1 tablet (12.5 mg total) by mouth 2 (two) times daily. 60 tablet 3  . CYMBALTA 60 MG capsule Take 60 mg by mouth daily.  1  . diltiazem (CARDIZEM CD) 360 MG 24 hr capsule Take 1 capsule (360 mg total) by  mouth daily. 30 capsule 6  . diphenhydrAMINE (BENADRYL) 25 mg capsule TAKE 2 TABLETS (50 MG TOTAL) BY MOUTH EVERY 6 (SIX) HOURS AS NEEDED FOR ALLERGIES. 60 capsule 0  . ELIQUIS 5 MG TABS tablet TAKE 1 TABLET (5 MG TOTAL) BY MOUTH 2 (TWO) TIMES DAILY. 60 tablet 5  . fluticasone (FLONASE) 50 MCG/ACT nasal spray Place 2 sprays into both nostrils daily. 16 g 0  . ketotifen (ZADITOR) 0.025 % ophthalmic solution Place 1 drop into both eyes 2 (two) times daily. 10 mL 1  . levETIRAcetam (KEPPRA) 250 MG tablet Take 500 mg by mouth 2 (two) times daily.    . montelukast (SINGULAIR) 10 MG tablet Take 1 tablet (10 mg total) by mouth at bedtime. 30 tablet 3  . olopatadine (PATANOL) 0.1 % ophthalmic solution Place 1 drop into both eyes 2 (two) times daily. 5 mL 5  . omeprazole (PRILOSEC) 20 MG capsule TAKE 1 CAPSULE BY MOUTH EVERY DAY 90 capsule 0  . traZODone (DESYREL) 100 MG tablet Take 100-200 mg by mouth at bedtime.  2  . triamcinolone (NASACORT AQ) 55 MCG/ACT AERO nasal inhaler Place 2 sprays into the nose daily. 1 Inhaler 12  . triamcinolone ointment (KENALOG) 0.5 % Apply 1 application topically 2 (two) times daily. 30 g 0  . VOLTAREN 1 % GEL Apply 1 application topically 4 (four) times daily.  2  . hydrALAZINE (APRESOLINE) 100 MG tablet Take 1 tablet (100 mg total) by mouth 3 (three) times daily. 270 tablet 3  . HYDROcodone-acetaminophen (NORCO) 5-325 MG tablet Take 1-2 tablets by mouth every 6 (six) hours as needed for moderate pain. One to two tabs every 4-6 hours for pain (Patient not taking: Reported on 08/29/2017) 20 tablet 0  . oxyCODONE-acetaminophen (ROXICET) 5-325 MG tablet Take 1-2 tablets by mouth every 4 (four) hours as needed for severe pain. (Patient not taking: Reported on 08/29/2017) 20 tablet 0  . amoxicillin (AMOXIL) 500 MG capsule Take 1 capsule (500 mg total) by mouth 3 (three) times daily. (Patient not taking: Reported on 08/29/2017) 30 capsule 0   No facility-administered medications  prior to visit.     ROS Review of Systems  Constitutional: Negative for activity change and appetite change.  HENT: Negative for sinus pressure and sore throat.   Eyes: Negative for visual disturbance.  Respiratory: Positive for cough. Negative for chest tightness and shortness of breath.   Cardiovascular: Negative for chest pain and leg swelling.  Gastrointestinal: Negative for abdominal distention, abdominal pain, constipation and diarrhea.  Endocrine: Negative.   Genitourinary: Negative for dysuria.  Musculoskeletal: Positive for back pain.  Skin: Negative for rash.  Allergic/Immunologic: Negative.   Neurological: Negative for weakness, light-headedness and numbness.  Psychiatric/Behavioral: Negative for dysphoric mood and suicidal ideas.    Objective:  BP (!) 151/87   Pulse 76   Temp 97.8 F (36.6 C) (Oral)  Ht 6' 1" (1.854 m)   Wt 252 lb (114.3 kg)   SpO2 98%   BMI 33.25 kg/m   BP/Weight 08/29/2017 02/25/5725 2/0/3559  Systolic BP 741 638 -  Diastolic BP 87 84 -  Wt. (Lbs) 252 252 250  BMI 33.25 33.25 32.98      Physical Exam  Constitutional: He is oriented to person, place, and time. He appears well-developed and well-nourished.  HENT:  Right Ear: External ear normal.  Left Ear: External ear normal.  Mouth/Throat: Oropharynx is clear and moist.  Cardiovascular: Normal rate, normal heart sounds and intact distal pulses.  No murmur heard. Pulmonary/Chest: Effort normal and breath sounds normal. He has no wheezes. He has no rales. He exhibits no tenderness.  Abdominal: Soft. Bowel sounds are normal. He exhibits no distension and no mass. There is no tenderness.  Musculoskeletal: He exhibits tenderness (TTP of lumbar spine).  Neurological: He is alert and oriented to person, place, and time.  Skin: Skin is warm and dry.  Psychiatric: He has a normal mood and affect.     Assessment & Plan:   1. Chronic right-sided low back pain with right-sided  sciatica Uncontrolled He declines muscle relaxants, tramadol - CMP14+EGFR - VOLTAREN 1 % GEL; Apply 4 g topically 4 (four) times daily.  Dispense: 100 g; Refill: 2 - Drug Screen 13 w/Conf, WB - lidocaine (LIDODERM) 5 %; Place 1 patch onto the skin daily. Remove & Discard patch within 12 hours or as directed by MD  Dispense: 30 patch; Refill: 1 - Ambulatory referral to Spine Surgery  2. Cough Could be secondary to postnasal drip Placed on antihistamine - cetirizine (ZYRTEC) 10 MG tablet; Take 1 tablet (10 mg total) by mouth daily.  Dispense: 30 tablet; Refill: 1  3. Essential hypertension Elevated blood pressure likely from pain Continue current management, low-sodium and DASH diet.   Meds ordered this encounter  Medications  . VOLTAREN 1 % GEL    Sig: Apply 4 g topically 4 (four) times daily.    Dispense:  100 g    Refill:  2  . lidocaine (LIDODERM) 5 %    Sig: Place 1 patch onto the skin daily. Remove & Discard patch within 12 hours or as directed by MD    Dispense:  30 patch    Refill:  1  . cetirizine (ZYRTEC) 10 MG tablet    Sig: Take 1 tablet (10 mg total) by mouth daily.    Dispense:  30 tablet    Refill:  1    Follow-up: No Follow-up on file.   Charlott Rakes MD

## 2017-08-29 NOTE — Telephone Encounter (Signed)
Pt came tot office he want [facility] that he wasn't the referral to be sent, please follow up

## 2017-08-29 NOTE — Telephone Encounter (Signed)
He told me [facility] at his appointment

## 2017-08-29 NOTE — Telephone Encounter (Signed)
Patient dropped off documentation for provider to sign off on additional hours of PCS services.   Spoke with Dr. Margarita Rana (provider) and she stated that based on patient's status there aren't any changes to his medical condition (patient was seen today) therefore she can't sign the PCS form.   Call placed to patient [phone], and informed him of my conversation with provider. Patient understood and had no further questions.

## 2017-08-30 ENCOUNTER — Encounter: Payer: Self-pay | Admitting: Family Medicine

## 2017-08-31 ENCOUNTER — Encounter: Payer: Self-pay | Admitting: Pharmacist

## 2017-08-31 NOTE — Progress Notes (Signed)
PA submitted and approved for Lidocaine patches. Approval #[id number]

## 2017-09-07 LAB — DRUG SCREEN 13 W/CONF, WB
Amphetamines, IA: NEGATIVE ng/mL
Barbiturates, IA: NEGATIVE ug/mL
Benzodiazepines, IA: NEGATIVE ng/mL
Cocaine/Metabolite, IA: NEGATIVE ng/mL
Fentanyl, IA: NEGATIVE ng/mL
Meperidine, IA: NEGATIVE ng/mL
Methadone, IA: NEGATIVE ng/mL
Opiates, IA: NEGATIVE ng/mL
Oxycodones, IA: NEGATIVE ng/mL
Phencyclidine, IA: NEGATIVE ng/mL
Propoxyphene, IA: NEGATIVE ng/mL
THC (Marijuana) Mtb, IA: POSITIVE ng/mL — AB
Tramadol, IA: NEGATIVE ng/mL

## 2017-09-07 LAB — THC,MS,WB/SP RFX
Cannabidiol: NEGATIVE ng/mL
Cannabinoid Confirmation: POSITIVE
Cannabinol: NEGATIVE ng/mL
Carboxy-THC: 8.2 ng/mL
Hydroxy-THC: NEGATIVE ng/mL
Tetrahydrocannabinol(THC): NEGATIVE ng/mL

## 2017-09-07 LAB — CMP14+EGFR
ALT: 16 IU/L (ref 0–44)
AST: 16 IU/L (ref 0–40)
Albumin/Globulin Ratio: 1.3 (ref 1.2–2.2)
Albumin: 4.2 g/dL (ref 3.5–5.5)
Alkaline Phosphatase: 82 IU/L (ref 39–117)
BUN/Creatinine Ratio: 10 (ref 9–20)
BUN: 12 mg/dL (ref 6–24)
Bilirubin Total: 0.2 mg/dL (ref 0.0–1.2)
CO2: 22 mmol/L (ref 20–29)
Calcium: 9.7 mg/dL (ref 8.7–10.2)
Chloride: 102 mmol/L (ref 96–106)
Creatinine, Ser: 1.26 mg/dL (ref 0.76–1.27)
GFR calc Af Amer: 73 mL/min/{1.73_m2} (ref >59)
GFR calc non Af Amer: 63 mL/min/{1.73_m2} (ref >59)
Globulin, Total: 3.3 g/dL (ref 1.5–4.5)
Glucose: 114 mg/dL — ABNORMAL HIGH (ref 65–99)
Potassium: 3.9 mmol/L (ref 3.5–5.2)
Sodium: 140 mmol/L (ref 134–144)
Total Protein: 7.5 g/dL (ref 6.0–8.5)

## 2017-09-11 ENCOUNTER — Other Ambulatory Visit: Payer: Self-pay | Admitting: Family Medicine

## 2017-09-11 DIAGNOSIS — J3089 Other allergic rhinitis: Secondary | ICD-10-CM

## 2017-09-14 ENCOUNTER — Ambulatory Visit (INDEPENDENT_AMBULATORY_CARE_PROVIDER_SITE_OTHER): Payer: Medicaid Other | Admitting: Physical Medicine and Rehabilitation

## 2017-09-14 ENCOUNTER — Encounter (INDEPENDENT_AMBULATORY_CARE_PROVIDER_SITE_OTHER): Payer: Self-pay | Admitting: Physical Medicine and Rehabilitation

## 2017-09-14 VITALS — BP 149/100 | HR 91 | Temp 98.1°F

## 2017-09-14 DIAGNOSIS — M47816 Spondylosis without myelopathy or radiculopathy, lumbar region: Secondary | ICD-10-CM

## 2017-09-14 DIAGNOSIS — M5136 Other intervertebral disc degeneration, lumbar region: Secondary | ICD-10-CM | POA: Diagnosis not present

## 2017-09-14 DIAGNOSIS — M5441 Lumbago with sciatica, right side: Secondary | ICD-10-CM

## 2017-09-14 DIAGNOSIS — G8929 Other chronic pain: Secondary | ICD-10-CM | POA: Diagnosis not present

## 2017-09-14 DIAGNOSIS — G894 Chronic pain syndrome: Secondary | ICD-10-CM

## 2017-09-14 MED ORDER — LIDOCAINE HCL (PF) 1 % IJ SOLN
3.0000 mL | INTRAMUSCULAR | Status: AC | PRN
  Administered 2017-09-14: 3 mL

## 2017-09-14 MED ORDER — TRIAMCINOLONE ACETONIDE 40 MG/ML IJ SUSP
40.0000 mg | INTRAMUSCULAR | Status: AC | PRN
  Administered 2017-09-14: 40 mg via INTRAMUSCULAR

## 2017-09-14 NOTE — Progress Notes (Signed)
Jason Stokes - 57 y.o. male MRN [id number]  Date of birth: January 01, 1961  Office Visit Note: Visit Date: 09/14/2017 PCP: Charlott Rakes, MD Referred by: Charlott Rakes, MD  Subjective: Chief Complaint  Patient presents with  . Lower Back - Pain  . Right Leg - Pain   HPI: Jason Stokes is a 57 year old gentleman who comes in today at the request of his primary care physician Dr. Margarita Rana for evaluation and possible treatment of the patient's low back pain and sometimes right leg pain.  Jason Stokes has a complicated medical history including schizophrenia and bipolar disorder and anxiety and depression as well as history of stroke and paroxysmal atrial fibrillation.  He actually sees Dr. Ninfa Linden and Benita Stabile in our office for his knees.  They have injected his knees in the past and have given him a small amount of hydrocodone for his knee pain.  The patient's complaints today are mostly severe low back pain that is mostly with standing but can be even with going from sit to stand.  He reports he also has pain that will radiate into the right leg to his big toe at times but his main complaint is the back pain.  He reports a history of nerve damage and he was seeing [facility] neurology for several years.  It appears that MRI was performed of the lumbar spine and we do have the results from 2015 and this is reviewed below.  He was seeing Dr. Jannifer Franklin at the time.  His history is somewhat hard because he was seen by at least 2 different pain clinics.  He reports being seen at [place] at the [facility].  He saw Dr. Darral Dash and they wanted to complete spinal cord stimulator trial.  He says no injections were performed at that facility.  He did not want to entertain the idea of a indwelling spinal cord stimulator.  There is also a consultation in place for pain management at [facility].  That seems like it is still in place and ongoing evaluation.  The patient was offered tramadol and Tylenol 3 by his  primary care physician but he refused saying that does not help.  He reports to me today that he would like to find out why his back hurts if there is anything that could be done.  He has seen a spine surgeon at some point but he cannot remember who the surgeon was.  He has not any prior back surgery.  He does not relate any specific traumatic injury.  He reports that he does want the fourth and fifth vertebra to be fixed.  He states his problem is in the fourth and fifth vertebra.  He reports that he has just been dealing with the pain and not taking anything or nothing has been done.  He ambulates with a cane.  He does wear a lumbar corset.  He wears a lumbar corset essentially the whole day.  He has not noted any focal weakness or bowel bladder changes.  He said no fevers chills or night sweats.  He has had no unintended weight loss.  As an interesting aside, during the interview the patient's right leg began to shake uncontrollably and what appeared to be a volitional tremor that lasted just a few seconds.  He states that that will happen on occasion.  He did not endorse this during the interview until it happened.  I did tell him that that would not be something from the  lumbar spine.  Peripheral nervous system issues from the lumbar spine but not cause any sort of motor tremor to occur like that.  I did suggest he should follow-up with the neurologist in terms of this condition.    Review of Systems  Constitutional: Negative for chills, fever, malaise/fatigue and weight loss.  HENT: Negative for hearing loss and sinus pain.   Eyes: Negative for blurred vision, double vision and photophobia.  Respiratory: Negative for cough and shortness of breath.   Cardiovascular: Negative for chest pain, palpitations and leg swelling.  Gastrointestinal: Negative for abdominal pain, nausea and vomiting.  Genitourinary: Negative for flank pain.  Musculoskeletal: Positive for back pain and joint pain. Negative for  myalgias.       Right leg and foot pain  Skin: Negative for itching and rash.  Neurological: Positive for tingling. Negative for tremors, focal weakness and weakness.  Endo/Heme/Allergies: Negative.   Psychiatric/Behavioral: Negative for depression.  All other systems reviewed and are negative.  Otherwise per HPI.  Assessment & Plan: Visit Diagnoses:  1. Chronic bilateral low back pain with right-sided sciatica   2. Spondylosis without myelopathy or radiculopathy, lumbar region   3. Degenerative disc disease, lumbar   4. Chronic pain syndrome     Plan: Findings:  Chronic history of recalcitrant low back pain and some radicular type pain on the right.  Epidural injections were helpful only on a short-term.  A few years ago when he has not had one in a while.  He has been through 2 pain clinics including [facility] which is on the [facility] the area and this is at [facility].  He reports the morning to do spinal cord stimulator trials which she did not want to perform.  He is seen in our office for his knee by Dr. Ninfa Linden.  She gets a small amount of hydrocodone for his knee.  He has most of his pain in the lower back with standing and is mostly axial.  Looking at prior MRI findings I think this is mostly facet mediated low back pain.  It obviously would not explain the right leg pain.  His right leg pain is sporadic and not as much as his low back.  I think the best approach at this point is threefold, first step is to complete injection into the trigger point and taut band on the left and see how much diagnostic pain relief happens the second is to look at updated MRI of the lumbar spine, given the fact that he does have some radicular issues and to clear up the motion that he needs the L4 and L5 vertebra to be fixed, and lastly he should continue with referral through chronic pain management in terms of medication management.  We talked about at length the use of opioids in the  setting of chronic pain.  I think at this point depending on the MRI findings we would look at surgical referral if necessary.  We could look at diagnostic facet or medial branch blocks and radiofrequency ablation which I explained to him briefly.  Otherwise he will continue to see Dr. Ninfa Linden for his knee.  He should go forward with a pain management referral if they agree to see him.  We in our office are not chronic pain management to a degree that we can follow somebody with chronic opioid doses.  We actually do not feel that that is very beneficial in the long run and I will try to  help him find source of his pain.  Lastly in terms of his right tremor that we witnessed on one occasion I think this needs to be looked at by his neurologist.  It did seem somewhat volitionally and I believe the patient when he says he did not try to make it happen.  It is clearly not something related to the lumbar spine.  It would be interesting to see the MRI results.  No change in medications were made today and we felt like the trigger point injection with cortisone may give him some pain relief.  His pain was treated today.    Meds & Orders: No orders of the defined types were placed in this encounter.   Orders Placed This Encounter  Procedures  . Trigger Point Inj  . MR LUMBAR SPINE WO CONTRAST    Follow-up: Return for MRI review after completion.   Procedures: Trigger Point Inj Date/Time: 09/14/2017 1:24 PM Performed by: Magnus Sinning, MD Authorized by: Magnus Sinning, MD   Consent Given by:  Patient Site marked: the procedure site was marked   Timeout: prior to procedure the correct patient, procedure, and site was verified   Indications:  Pain Total # of Trigger Points:  1 Location: back   Needle Size:  25 G Approach:  Dorsal Medications #1:  40 mg triamcinolone acetonide 40 MG/ML; 3 mL lidocaine (PF) 1 % Patient tolerance:  Patient tolerated the procedure well with no immediate  complications Comments: Muscle injection was performed into the trigger point at the L4 paraspinal region with a needling technique.    No notes on file   Clinical History: [facility] [address] [place], [place] [address] [phone]  NEUROIMAGING REPORT    STUDY DATE: 02/22/2014 PATIENT NAME: QUAVON KEISLING DOB: [birth year] MRN: [id number]  ORDERING CLINICIAN: Dr Jannifer Franklin CLINICAL HISTORY: 57 year male with low back and bilateral leg pain COMPARISON FILMS: MRI L Spine 02/21/2013 EXAM: MRI Lumbar Spine wo TECHNIQUE: MRI of the lumbar spine was obtained utilizing 4 mm sagittal slices from K27-06 down to the lower sacrum with T1, T2 and inversion recovery views. In addition 4 mm axial slices from C3-7 down to L5-S1 level were included with T1 and T2 weighted views. CONTRAST: none IMAGING SITE: [facility]  FINDINGS:  The lumbar vertebrae demonstrate normal alignment, wife died and marrow signal characteristics. The intervertebral disc of mild this degenerative changes mainly at L3-L4 with is a broad-based central disc protrusion with facet hypertrophy and mild bilateral foraminal narrowing but without definite compression. L4-5 shows slight  central subligamentous disc protrusion along with facet hypertrophy and mild bilateral foraminal narrowing but without definite compression. L5-S1 show midline and the signal abnormalities. The conus medullaris terminates at L1. The visualized paraspinal soft tissues appear unremarkable.     IMPRESSION:  Abnormal MRI scan of the lumbar spine showing mild disc protrusions at L3-4 and L4-5 and by foraminal narrowing but without definite compression. Overall no significant change compared with previous MRI scan dated 02/21/2013.  He reports that he has been smoking cigarettes.  He has a 7.25 pack-year smoking history. he has never used smokeless tobacco.  Recent Labs    04/03/17 0919  HGBA1C 5.7     Objective:  VS:  HT:    WT:   BMI:     BP:(!) 149/100  HR:91bpm  TEMP:98.1 F (36.7 C)(Oral)  RESP:99 % Physical Exam  Constitutional: He is oriented to person, place, and time. He appears well-developed  and well-nourished. No distress.  HENT:  Head: Normocephalic and atraumatic.  Nose: Nose normal.  Mouth/Throat: Oropharynx is clear and moist.  Eyes: Conjunctivae are normal. Pupils are equal, round, and reactive to light.  Neck: Normal range of motion. Neck supple. No tracheal deviation present.  Cardiovascular: Regular rhythm and intact distal pulses.  Pulmonary/Chest: Effort normal and breath sounds normal.  Abdominal: Soft. He exhibits no distension. There is no rebound and no guarding.  Musculoskeletal: He exhibits no deformity.  Patient is very slow to rise from a seated position.  He stands with a forward flexed lumbar spine.  He has 2 focal areas of either trigger point or subcutaneous lipoma really at the L4 region.  There are these when he refers to his knots.  The left-sided trigger point does reproduce some of his back pain.  He does have pain with extension of the lumbar spine.  He has no pain with range of motion at the hips bilaterally.  He has good distal strength he has no clonus.  He has no pain over the greater trochanters.  He has a negative slump test.  Slump test does cause him to have back pain.  Neurological: He is alert and oriented to person, place, and time. He exhibits normal muscle tone. Coordination normal.  Dysesthesia to light touch in L5 dermatome on the right.  Skin: Skin is warm. No rash noted.  Psychiatric: He has a normal mood and affect. His behavior is normal.  Nursing note and vitals reviewed.   Ortho Exam Imaging: No results found.  Past Medical/Family/Surgical/Social History: Medications & Allergies reviewed per EMR Patient Active Problem List   Diagnosis Date Noted  . Stroke ([place])   . Small vessel disease   . Schizophrenia ([place])    . Migraine   . History of nuclear stress test   . History of alcohol abuse   . GERD (gastroesophageal reflux disease)   . Dysrhythmia   . Chronic lower back pain   . Anxiety   . Arthritis of knee, right 04/12/2017  . Bipolar disorder ([place]) 04/03/2017  . Lipoma of forehead 09/22/2016  . Trigger ring finger of right hand 06/22/2016  . Paroxysmal atrial fibrillation ([facility])   . AF (atrial fibrillation) ([place]) 12/10/2015  . Allergic rhinoconjunctivitis of left eye 07/10/2015  . Eczema 07/10/2015  . Sweating 07/10/2015  . History of CVA (cerebrovascular accident) 05/04/2015  . PAF (paroxysmal atrial fibrillation) ([place]) 03/27/2015  . Tear of medial meniscus of right knee 03/18/2015  . Chronic pain of right knee 03/12/2015  . Other and unspecified hyperlipidemia 11/25/2013  . Nasal congestion 11/25/2013  . Sinusitis, chronic 05/09/2013  . Chronic low back pain 10/12/2012  . Sciatica neuralgia 10/12/2012  . Hypertension 10/12/2012  . Depression 10/12/2012  . Insomnia 10/12/2012   Past Medical History:  Diagnosis Date  . Anxiety   . Bipolar disorder ([place])   . Chronic lower back pain   . Depression   . Dysrhythmia    a-fib  . GERD (gastroesophageal reflux disease)   . History of alcohol abuse   . History of nuclear stress test    Myoview 10/16: EF 50%, diaphragmatic attenuation, no ischemia, low risk  . Hypertension   . Migraine 2012-2014  . PAF (paroxysmal atrial fibrillation) ([place]) 03/27/2015   a. Myoview neg for ischemia >> Flecainide started 10/16 >> FU ETT   . Schizophrenia ([place])   . Sciatica neuralgia   . Small vessel disease    Right basal ganglia  stroke  . Stroke [facility]) "between 2012-2014"   residual "AF" (09/22/2015)   Family History  Problem Relation Age of Onset  . Heart disease Father   . Schizophrenia Sister    Past Surgical History:  Procedure Laterality Date  . ATRIAL FIBRILLATION ABLATION  09/22/2015  . CYST EXCISION  1996-97   surgery back of head   .  ELECTROPHYSIOLOGIC STUDY N/A 09/22/2015   Procedure: Atrial Fibrillation Ablation;  Surgeon: Will [name], MD;  Location: [facility];  Service: Cardiovascular;  Laterality: N/A;  . ELECTROPHYSIOLOGIC STUDY N/A 12/10/2015   Procedure: Atrial Fibrillation Ablation;  Surgeon: Will [name], MD;  Location: [facility];  Service: Cardiovascular;  Laterality: N/A;  . ELECTROPHYSIOLOGIC STUDY N/A 12/11/2015   Procedure: Cardioversion;  Surgeon: Will [name], MD;  Location: [facility];  Service: Cardiovascular;  Laterality: N/A;  . EXCISION MASS HEAD N/A 01/06/2017   Procedure: EXCISION MASS FOREHEAD;  Surgeon: Irene Limbo, MD;  Location: [facility];  Service: Plastics;  Laterality: N/A;  . GANGLION CYST EXCISION Left   . INTERCOSTAL NERVE BLOCK  2005  . KNEE ARTHROSCOPY Right 2016   Social History   Occupational History    Comment: disabled  Tobacco Use  . Smoking status: Current Some Day Smoker    Packs/day: 0.25    Years: 29.00    Pack years: 7.25    Types: Cigarettes    Last attempt to quit: 08/19/2015    Years since quitting: 2.0  . Smokeless tobacco: Never Used  Substance and Sexual Activity  . Alcohol use: Yes    Alcohol/week: 0.0 oz    Comment: drinks social  . Drug use: Yes    Types: Marijuana    Comment: smoked marijuana 01-02-17 for pain  . Sexual activity: Not Currently

## 2017-09-14 NOTE — Progress Notes (Deleted)
Pt states severe lower back pain in lower back that radiates through the right leg. Pt states he has nerve damage also. Pt states he can tell when his back is about to go out and he has been feeling like that for the past month. Pt states walking makes pain worse, nothing makes the pain better. Pt states he has been to 2 pain management clinics and they were suppose to be putting in spinal cord stimulator, but he declined. Pt states he wants his 4th and 5th vertebrae to be fixed. Pt states he has been just dealing with the pain he has not done or taking anything.

## 2017-09-18 ENCOUNTER — Encounter: Payer: Self-pay | Admitting: Pharmacist

## 2017-09-18 NOTE — Progress Notes (Signed)
PA submitted for lidocaine patches. Pending approval by [place] [employer].

## 2017-09-21 ENCOUNTER — Encounter (INDEPENDENT_AMBULATORY_CARE_PROVIDER_SITE_OTHER): Payer: Self-pay | Admitting: Physician Assistant

## 2017-09-21 ENCOUNTER — Ambulatory Visit (INDEPENDENT_AMBULATORY_CARE_PROVIDER_SITE_OTHER): Payer: Medicaid Other | Admitting: Physician Assistant

## 2017-09-21 DIAGNOSIS — M1711 Unilateral primary osteoarthritis, right knee: Secondary | ICD-10-CM | POA: Diagnosis not present

## 2017-09-21 MED ORDER — LIDOCAINE HCL 1 % IJ SOLN
3.0000 mL | INTRAMUSCULAR | Status: AC | PRN
  Administered 2017-09-21: 3 mL

## 2017-09-21 MED ORDER — METHYLPREDNISOLONE ACETATE 40 MG/ML IJ SUSP
40.0000 mg | INTRAMUSCULAR | Status: AC | PRN
  Administered 2017-09-21: 40 mg via INTRA_ARTICULAR

## 2017-09-21 NOTE — Progress Notes (Signed)
   Procedure Note  [name]             Date of Birth: 1960/08/09           MRN: [id number]             Visit Date: 09/21/2017  HPI: Jason Stokes returns today for swelling and pain in his right knee.  He has moderate severe of the right knee.  Is also requesting pain medications in the knee.  He asked about a supplemental injection in the knee.  He has had no new injury to the right knee. Physical exam : Right knee good range of motion.  No effusion abnormal warmth erythema. Procedures: Visit Diagnoses: Primary osteoarthritis of right knee  Large Joint Inj: R knee on 09/21/2017 4:15 PM Indications: pain Details: 22 G 1.5 in needle, anterolateral approach  Arthrogram: No  Medications: 3 mL lidocaine 1 %; 40 mg methylPREDNISolone acetate 40 MG/ML Outcome: tolerated well, no immediate complications Procedure, treatment alternatives, risks and benefits explained, specific risks discussed. Consent was given by the patient. Immediately prior to procedure a time out was called to verify the correct patient, procedure, equipment, support staff and site/side marked as required. Patient was prepped and draped in the usual sterile fashion.     Plan: Discussed with him that narcotics are not indicated for arthritis in the knee.  He is not eligible for supplemental injection of the knee due to his insurance.  Therefore we will see how the injection does and his knee he understands he can only have cortisone injections in the knee every 3 months.  The only other alternative is knee replacement.

## 2017-10-02 ENCOUNTER — Ambulatory Visit
Admission: RE | Admit: 2017-10-02 | Discharge: 2017-10-02 | Disposition: A | Discharge status: Home / Self Care | Payer: Medicaid Other | Source: Ambulatory Visit | Attending: Physical Medicine and Rehabilitation | Admitting: Physical Medicine and Rehabilitation

## 2017-10-02 DIAGNOSIS — G8929 Other chronic pain: Secondary | ICD-10-CM

## 2017-10-02 DIAGNOSIS — M5441 Lumbago with sciatica, right side: Principal | ICD-10-CM

## 2017-10-18 ENCOUNTER — Encounter (INDEPENDENT_AMBULATORY_CARE_PROVIDER_SITE_OTHER): Payer: Self-pay | Admitting: Physical Medicine and Rehabilitation

## 2017-10-18 ENCOUNTER — Ambulatory Visit (INDEPENDENT_AMBULATORY_CARE_PROVIDER_SITE_OTHER): Payer: Medicaid Other | Admitting: Physical Medicine and Rehabilitation

## 2017-10-18 VITALS — BP 146/110 | HR 79 | Temp 98.2°F

## 2017-10-18 DIAGNOSIS — M5441 Lumbago with sciatica, right side: Secondary | ICD-10-CM | POA: Diagnosis not present

## 2017-10-18 DIAGNOSIS — G894 Chronic pain syndrome: Secondary | ICD-10-CM | POA: Diagnosis not present

## 2017-10-18 DIAGNOSIS — G8929 Other chronic pain: Secondary | ICD-10-CM | POA: Diagnosis not present

## 2017-10-18 DIAGNOSIS — M5116 Intervertebral disc disorders with radiculopathy, lumbar region: Secondary | ICD-10-CM | POA: Diagnosis not present

## 2017-10-18 DIAGNOSIS — M5442 Lumbago with sciatica, left side: Secondary | ICD-10-CM

## 2017-10-18 NOTE — Progress Notes (Signed)
 .  Numeric Pain Rating Scale and Functional Assessment Average Pain 10 Pain Right Now 10 My pain is constant, sharp and stabbing Pain is worse with: walking, bending, sitting and standing Pain improves with: therapy/exercise   In the last MONTH (on 0-10 scale) has pain interfered with the following?  1. General activity like being  able to carry out your everyday physical activities such as walking, climbing stairs, carrying groceries, or moving a chair?  Rating(8)  2. Relation with others like being able to carry out your usual social activities and roles such as  activities at home, at work and in your community. Rating(0)  3. Enjoyment of life such that you have  been bothered by emotional problems such as feeling anxious, depressed or irritable?  Rating(7)

## 2017-10-31 ENCOUNTER — Encounter: Payer: Self-pay | Admitting: Family Medicine

## 2017-10-31 ENCOUNTER — Ambulatory Visit: Payer: Medicaid Other | Attending: Family Medicine | Admitting: Family Medicine

## 2017-10-31 VITALS — BP 151/89 | HR 100 | Temp 98.0°F | Ht 73.0 in | Wt 255.0 lb

## 2017-10-31 DIAGNOSIS — E119 Type 2 diabetes mellitus without complications: Secondary | ICD-10-CM | POA: Insufficient documentation

## 2017-10-31 DIAGNOSIS — J302 Other seasonal allergic rhinitis: Secondary | ICD-10-CM | POA: Diagnosis not present

## 2017-10-31 DIAGNOSIS — J321 Chronic frontal sinusitis: Secondary | ICD-10-CM

## 2017-10-31 DIAGNOSIS — I10 Essential (primary) hypertension: Secondary | ICD-10-CM | POA: Diagnosis not present

## 2017-10-31 DIAGNOSIS — F209 Schizophrenia, unspecified: Secondary | ICD-10-CM | POA: Diagnosis not present

## 2017-10-31 DIAGNOSIS — I480 Paroxysmal atrial fibrillation: Secondary | ICD-10-CM

## 2017-10-31 DIAGNOSIS — F419 Anxiety disorder, unspecified: Secondary | ICD-10-CM | POA: Diagnosis not present

## 2017-10-31 DIAGNOSIS — R0602 Shortness of breath: Secondary | ICD-10-CM | POA: Diagnosis not present

## 2017-10-31 DIAGNOSIS — M545 Low back pain: Secondary | ICD-10-CM | POA: Diagnosis present

## 2017-10-31 DIAGNOSIS — R251 Tremor, unspecified: Secondary | ICD-10-CM

## 2017-10-31 DIAGNOSIS — Z8673 Personal history of transient ischemic attack (TIA), and cerebral infarction without residual deficits: Secondary | ICD-10-CM | POA: Insufficient documentation

## 2017-10-31 DIAGNOSIS — F2089 Other schizophrenia: Secondary | ICD-10-CM

## 2017-10-31 DIAGNOSIS — R200 Anesthesia of skin: Secondary | ICD-10-CM | POA: Diagnosis not present

## 2017-10-31 DIAGNOSIS — M5441 Lumbago with sciatica, right side: Secondary | ICD-10-CM | POA: Diagnosis not present

## 2017-10-31 DIAGNOSIS — Z7901 Long term (current) use of anticoagulants: Secondary | ICD-10-CM | POA: Diagnosis not present

## 2017-10-31 DIAGNOSIS — G8929 Other chronic pain: Secondary | ICD-10-CM | POA: Diagnosis not present

## 2017-10-31 DIAGNOSIS — K219 Gastro-esophageal reflux disease without esophagitis: Secondary | ICD-10-CM | POA: Diagnosis not present

## 2017-10-31 DIAGNOSIS — M5126 Other intervertebral disc displacement, lumbar region: Secondary | ICD-10-CM | POA: Insufficient documentation

## 2017-10-31 DIAGNOSIS — F319 Bipolar disorder, unspecified: Secondary | ICD-10-CM | POA: Diagnosis not present

## 2017-10-31 DIAGNOSIS — Z79899 Other long term (current) drug therapy: Secondary | ICD-10-CM | POA: Diagnosis not present

## 2017-10-31 DIAGNOSIS — Z888 Allergy status to other drugs, medicaments and biological substances status: Secondary | ICD-10-CM | POA: Insufficient documentation

## 2017-10-31 DIAGNOSIS — M25561 Pain in right knee: Secondary | ICD-10-CM

## 2017-10-31 MED ORDER — LIDOCAINE 5 % EX PTCH
1.0000 | MEDICATED_PATCH | CUTANEOUS | 2 refills | Status: DC
Start: 2017-10-31 — End: 2018-07-24

## 2017-10-31 MED ORDER — FLUTICASONE PROPIONATE 50 MCG/ACT NA SUSP: 2.0000 | Freq: Every day | NASAL | 1 refills | Status: DC

## 2017-10-31 MED ORDER — CETIRIZINE HCL 10 MG PO TABS: 10.0000 mg | ORAL_TABLET | Freq: Every day | ORAL | 1 refills | Status: DC

## 2017-10-31 MED ORDER — ALBUTEROL SULFATE HFA 108 (90 BASE) MCG/ACT IN AERS: 2.0000 | INHALATION_SPRAY | Freq: Four times a day (QID) | RESPIRATORY_TRACT | 1 refills | Status: DC | PRN

## 2017-10-31 NOTE — Patient Instructions (Signed)

## 2017-10-31 NOTE — Progress Notes (Signed)
Subjective:  Patient ID: Jason Stokes, male    DOB: 14-Mar-1961  Age: 57 y.o. MRN: [id number]  CC: Hypertension and Back Pain   HPI Jason Stokes  is a 57 year old male with a history of hypertension, type 2 diabetes mellitus (A1c 5.7), bipolar disorder, paroxysmal A. fib (previous AF ablation)who presents to the clinic for a follow up visit.  Seen by [employer] where he receive R knee cortisone injection with improvement in his R knee pain last month and was seen by Dr Ernestina Patches for low back pain, epidural spinal injection of his back was ineffective. Dr Ernestina Patches has recommended Neuro evaluation due to intermittent jerking and tremor of his right lower extremity which I had noticed at the patient's last visit with me but is absent today. MRI lumbar spine 10/02/17: IMPRESSION: No new abnormality since the prior MRI.  Shallow broad-based disc bulge at L3-4 without central canal stenosis. There is mild bilateral foraminal narrowing at this level.  Mild narrowing in the subarticular recesses and foramina at L4-5 where there is a shallow broad-based central protrusion and annular fissure.  He complains of severe back pain radiating down his right lower extremities with associated numbness in his extremities and has been using a back brace with no improvement in symptoms. Seen by pain management in [facility] and evaluated for a spine stimulator but this fell through; Physical Medicine declined referral He declined prescriptions for muscle relaxants, tramadol Tylenol No. 3 in the past as he states they do not work.  Requests something for allergies "the pollen has me short of breath and I feel like I am going into Afib".Denies chest pains. Compliant with Cardizem and Eliquis and has an upcoming appointment with [facility].  Past Medical History:  Diagnosis Date  . Anxiety   . Bipolar disorder ([place])   . Chronic lower back pain   . Depression   . Dysrhythmia    a-fib  .  GERD (gastroesophageal reflux disease)   . History of alcohol abuse   . History of nuclear stress test    Myoview 10/16: EF 50%, diaphragmatic attenuation, no ischemia, low risk  . Hypertension   . Migraine 2012-2014  . PAF (paroxysmal atrial fibrillation) ([place]) 03/27/2015   a. Myoview neg for ischemia >> Flecainide started 10/16 >> FU ETT   . Schizophrenia ([place])   . Sciatica neuralgia   . Small vessel disease ([place])    Right basal ganglia stroke  . Stroke [facility]) "between 2012-2014"   residual "AF" (09/22/2015)    Past Surgical History:  Procedure Laterality Date  . ATRIAL FIBRILLATION ABLATION  09/22/2015  . CYST EXCISION  1996-97   surgery back of head   . ELECTROPHYSIOLOGIC STUDY N/A 09/22/2015   Procedure: Atrial Fibrillation Ablation;  Surgeon: Will [name], MD;  Location: [place] CV LAB;  Service: Cardiovascular;  Laterality: N/A;  . ELECTROPHYSIOLOGIC STUDY N/A 12/10/2015   Procedure: Atrial Fibrillation Ablation;  Surgeon: Will [name], MD;  Location: [place] [facility];  Service: Cardiovascular;  Laterality: N/A;  . ELECTROPHYSIOLOGIC STUDY N/A 12/11/2015   Procedure: Cardioversion;  Surgeon: Will [name], MD;  Location: [place] CV LAB;  Service: Cardiovascular;  Laterality: N/A;  . EXCISION MASS HEAD N/A 01/06/2017   Procedure: EXCISION MASS FOREHEAD;  Surgeon: Irene Limbo, MD;  Location: [place];  Service: Plastics;  Laterality: N/A;  . GANGLION CYST EXCISION Left   . INTERCOSTAL NERVE BLOCK  2005  . KNEE ARTHROSCOPY Right 2016  Allergies  Allergen Reactions  . Lisinopril Anaphylaxis    Swelling of lips and tongue.     Outpatient Medications Prior to Visit  Medication Sig Dispense Refill  . carbamazepine (TEGRETOL) 200 MG tablet Take 200 mg by mouth 2 (two) times daily.    . carvedilol (COREG) 12.5 MG tablet Take 1 tablet (12.5 mg total) by mouth 2 (two) times daily. 60 tablet 3  . CYMBALTA 60 MG capsule Take 60 mg by  mouth daily.  1  . diltiazem (CARDIZEM CD) 300 MG 24 hr capsule TAKE 1 CAPSULE (300 MG TOTAL) BY MOUTH DAILY.  2  . diltiazem (CARDIZEM CD) 360 MG 24 hr capsule Take 1 capsule (360 mg total) by mouth daily. 30 capsule 6  . ELIQUIS 5 MG TABS tablet TAKE 1 TABLET (5 MG TOTAL) BY MOUTH 2 (TWO) TIMES DAILY. 60 tablet 5  . hydrALAZINE (APRESOLINE) 100 MG tablet TAKE 1 TABLET (100 MG TOTAL) BY MOUTH 3 (THREE) TIMES DAILY.  3  . ketotifen (ZADITOR) 0.025 % ophthalmic solution Place 1 drop into both eyes 2 (two) times daily. 10 mL 1  . levETIRAcetam (KEPPRA) 250 MG tablet Take 500 mg by mouth 2 (two) times daily.    . montelukast (SINGULAIR) 10 MG tablet TAKE 1 TABLET BY MOUTH EVERYDAY AT BEDTIME 30 tablet 2  . olopatadine (PATANOL) 0.1 % ophthalmic solution Place 1 drop into both eyes 2 (two) times daily. 5 mL 5  . omeprazole (PRILOSEC) 20 MG capsule TAKE 1 CAPSULE BY MOUTH EVERY DAY 90 capsule 0  . prazosin (MINIPRESS) 1 MG capsule TAKE 1 CAPSULE BY MOUTH EVERYDAY AT BEDTIME  2  . prednisoLONE acetate (PRED FORTE) 1 % ophthalmic suspension INSTILL 1 DROP INTO EACH EYE TWICE A DAY  0  . traZODone (DESYREL) 100 MG tablet Take 100-200 mg by mouth at bedtime.  2  . triamcinolone (NASACORT AQ) 55 MCG/ACT AERO nasal inhaler Place 2 sprays into the nose daily. 1 Inhaler 12  . triamcinolone ointment (KENALOG) 0.5 % Apply 1 application topically 2 (two) times daily. 30 g 0  . VOLTAREN 1 % GEL Apply 4 g topically 4 (four) times daily. 100 g 2  . cetirizine (ZYRTEC) 10 MG tablet Take 1 tablet (10 mg total) by mouth daily. 30 tablet 1  . diphenhydrAMINE (BENADRYL) 25 mg capsule TAKE 2 TABLETS (50 MG TOTAL) BY MOUTH EVERY 6 (SIX) HOURS AS NEEDED FOR ALLERGIES. 60 capsule 0  . fluticasone (FLONASE) 50 MCG/ACT nasal spray Place 2 sprays into both nostrils daily. 16 g 0  . lidocaine (LIDODERM) 5 % Place 1 patch onto the skin daily. Remove & Discard patch within 12 hours or as directed by MD 30 patch 1  . benzonatate  (TESSALON) 100 MG capsule Take 1 capsule (100 mg total) by mouth 2 (two) times daily as needed for cough. (Patient not taking: Reported on 10/31/2017) 20 capsule 0  . hydrALAZINE (APRESOLINE) 100 MG tablet Take 1 tablet (100 mg total) by mouth 3 (three) times daily. 270 tablet 3  . HYDROcodone-acetaminophen (NORCO) 5-325 MG tablet Take 1-2 tablets by mouth every 6 (six) hours as needed for moderate pain. One to two tabs every 4-6 hours for pain (Patient not taking: Reported on 10/31/2017) 20 tablet 0  . oxyCODONE-acetaminophen (ROXICET) 5-325 MG tablet Take 1-2 tablets by mouth every 4 (four) hours as needed for severe pain. (Patient not taking: Reported on 10/31/2017) 20 tablet 0   No facility-administered medications prior to visit.  ROS Review of Systems  Constitutional: Negative for activity change and appetite change.  HENT: Negative for sinus pressure and sore throat.   Eyes: Negative for visual disturbance.  Respiratory: Negative for cough, chest tightness and shortness of breath.   Cardiovascular: Negative for chest pain and leg swelling.  Gastrointestinal: Negative for abdominal distention, abdominal pain, constipation and diarrhea.  Endocrine: Negative.   Genitourinary: Negative for dysuria.  Musculoskeletal: Positive for back pain. Negative for joint swelling and myalgias.  Skin: Negative for rash.  Allergic/Immunologic: Negative.   Neurological: Negative for weakness, light-headedness and numbness.  Psychiatric/Behavioral: Negative for dysphoric mood and suicidal ideas.    Objective:  BP (!) 151/89   Pulse 100   Temp 98 F (36.7 C) (Oral)   Ht 6\' 1"  (1.854 m)   Wt 255 lb (115.7 kg)   SpO2 98%   BMI 33.64 kg/m   BP/Weight 10/31/2017 10/18/2017 2/95/1884  Systolic BP 166 063 016  Diastolic BP 89 010 932  Wt. (Lbs) 255 - -  BMI 33.64 - -      Physical Exam  Constitutional: He is oriented to person, place, and time. He appears well-developed and well-nourished.    HENT:  No sinus TTP Normal oropharynx, normal TM b/l  Cardiovascular: Normal rate, normal heart sounds and intact distal pulses.  No murmur heard. Pulmonary/Chest: Effort normal and breath sounds normal. He has no wheezes. He has no rales. He exhibits no tenderness.  Abdominal: Soft. Bowel sounds are normal. He exhibits no distension and no mass. There is no tenderness.  Musculoskeletal: Normal range of motion. He exhibits tenderness (TTP of lumbar spine, positive straight leg raise on the left).  Neurological: He is alert and oriented to person, place, and time.  Skin: Skin is warm and dry.  Psychiatric: He has a normal mood and affect.     Assessment & Plan:   1. Paroxysmal atrial fibrillation (HCC) Currently in sinus rhythm Continue anticoagulation with Eliquis and rate control with Cardizem  2. Chronic pain of right knee S/p Cortisone injection by [facility]  3. Essential hypertension BP elevated above goal No regimen change today and will reassess at next visit Counseled on blood pressure goal of less than 130/80, low-sodium, DASH diet, medication compliance, 150 minutes of moderate intensity exercise per week. Discussed medication compliance, adverse effects.   4. Chronic left-sided low back pain with right-sided sciatica Decline rx for muscle relaxant Placed on Lidoderm patch Physical medicine and rehab declined referral - Ambulatory referral to Pain Clinic  5. Other schizophrenia ([place]) As per mental Health at [facility]  6. Seasonal allergies - cetirizine (ZYRTEC) 10 MG tablet; Take 1 tablet (10 mg total) by mouth daily.  Dispense: 30 tablet; Refill: 1  7. Chronic frontal sinusitis - fluticasone (FLONASE) 50 MCG/ACT nasal spray; Place 2 sprays into both nostrils daily.  Dispense: 16 g; Refill: 1  8. Tremor - Ambulatory referral to Neurology  9. Chronic right-sided low back pain with right-sided sciatica S/p ESI by [facility] with no relief - lidocaine  (LIDODERM) 5 %; Place 1 patch onto the skin daily. Remove & Discard patch within 12 hours or as directed by MD  Dispense: 30 patch; Refill: 2   Meds ordered this encounter  Medications  . cetirizine (ZYRTEC) 10 MG tablet    Sig: Take 1 tablet (10 mg total) by mouth daily.    Dispense:  30 tablet    Refill:  1  . fluticasone (FLONASE) 50 MCG/ACT nasal spray  Sig: Place 2 sprays into both nostrils daily.    Dispense:  16 g    Refill:  1  . albuterol (PROVENTIL HFA;VENTOLIN HFA) 108 (90 Base) MCG/ACT inhaler    Sig: Inhale 2 puffs into the lungs every 6 (six) hours as needed for wheezing or shortness of breath.    Dispense:  1 Inhaler    Refill:  1  . lidocaine (LIDODERM) 5 %    Sig: Place 1 patch onto the skin daily. Remove & Discard patch within 12 hours or as directed by MD    Dispense:  30 patch    Refill:  2    Follow-up: Return in about 3 months (around 01/30/2018) for follow up of chronic medical conditions.   Charlott Rakes MD

## 2017-11-01 ENCOUNTER — Encounter: Payer: Self-pay | Admitting: Family Medicine

## 2017-11-14 ENCOUNTER — Other Ambulatory Visit: Payer: Self-pay | Admitting: Family Medicine

## 2017-11-25 ENCOUNTER — Other Ambulatory Visit: Payer: Self-pay | Admitting: Cardiology

## 2017-11-25 ENCOUNTER — Other Ambulatory Visit: Payer: Self-pay | Admitting: Family Medicine

## 2017-11-25 DIAGNOSIS — H1012 Acute atopic conjunctivitis, left eye: Secondary | ICD-10-CM

## 2017-11-25 DIAGNOSIS — J309 Allergic rhinitis, unspecified: Principal | ICD-10-CM

## 2017-11-28 NOTE — Telephone Encounter (Signed)
OK to fill 300 mg  360 mg removed from med list

## 2017-11-28 NOTE — Telephone Encounter (Signed)
Medication list has both 300 mg and 360 mg listed. I spoke with the patient to clarify and he stated that he reduced his dose back to the 300 mg as the 360 mg was strong for him. Okay to update med list and refill? Please advise. Thanks, MI

## 2017-11-29 ENCOUNTER — Encounter (INDEPENDENT_AMBULATORY_CARE_PROVIDER_SITE_OTHER): Payer: Self-pay | Admitting: Physical Medicine and Rehabilitation

## 2017-11-29 NOTE — Progress Notes (Signed)
Jason Stokes - 57 y.o. male MRN [id number]  Date of birth: [birth year]  Office Visit Note: Visit Date: 10/18/2017 PCP: Charlott Rakes, MD Referred by: Charlott Rakes, MD  Subjective: Chief Complaint  Patient presents with  . Lower Back - Pain  . Left Thigh - Pain  . Right Leg - Pain  . Right Foot - Pain   HPI: Jason Stokes is a 57 year old gentleman that we saw at the end of February at the request of Dr. His primary care physician for evaluation and potential management of his low back pain and bilateral leg pain.  He comes in today for review of updated MRI.  We did obtain an MRI of the lumbar spine but as his last MRI was in 2015 and he was reporting increasing symptoms over that time with severe pain unrelieved with current medications including small amount of hydrocodone.  His case is complicated by history of bipolar disorder and schizophrenia as well as depression and anxiety.  He has a chronic pain history and is actually been followed by several pain management physicians in the past.  He continues to report bilateral low back pain.  The pain will radiate down both right and left leg in a nondermatomal pattern.  He says on the right side will go all the way to the foot but on the left side it stops at his thigh.  He rates his average pain is 10 out of 10 and he rates his pain today just sitting is 10 out of 10.  He reports this is a sharp stabbing pain worse with walking and bending and sitting and standing.  He feels like it may help some with exercise.  He feels like it keeps him from doing a lot of daily activities.  He has not noted no focal weakness or new trauma.  He also has a history of cerebrovascular stroke.  He has had no new paresthesias.  MRI was reviewed with the patient today using a spine model and the images.   Review of Systems  Constitutional: Positive for malaise/fatigue. Negative for chills, fever and weight loss.  HENT: Negative for hearing loss and sinus pain.     Eyes: Negative for blurred vision, double vision and photophobia.  Respiratory: Negative for cough and shortness of breath.   Cardiovascular: Negative for chest pain, palpitations and leg swelling.  Gastrointestinal: Negative for abdominal pain, nausea and vomiting.  Genitourinary: Negative for flank pain.  Musculoskeletal: Positive for back pain and joint pain. Negative for myalgias.       Bilateral leg pain  Skin: Negative for itching and rash.  Neurological: Positive for tingling and weakness. Negative for tremors and focal weakness.  Endo/Heme/Allergies: Negative.   Psychiatric/Behavioral: Negative for depression.  All other systems reviewed and are negative.  Otherwise per HPI.  Assessment & Plan: Visit Diagnoses:  1. Radiculopathy due to lumbar intervertebral disc disorder   2. Chronic pain syndrome   3. Chronic bilateral low back pain with bilateral sciatica     Plan: Findings:  Chronic long-term history of low back pain with bilateral radicular leg pain and right slightly more than left.  He is failed conservative care over the years and has been through several instances of chronic pain management with some opioid management and other medications and multiple injections.  Was seen at [facility] and was felt to be a candidate for spinal cord stimulator.  Patient does not want to have a spinal cord stimulator and really not  very interested in injections.  His pain is seemingly out of proportion from what she would expect with the lumbar spine MRI pathology.  He basically has central protrusion at L4-5 with central annular tear but no focal nerve compression and mild lateral recess narrowing.  Obviously the mild lateral recess narrowing could cause irritation of both nerve roots but there is no compression.  This could be amenable to intermittent injections but these have not really helped in the past.  He does have a referral already placed for pain management at [facility] and I think  that would be a good referral for him.  From a spine pathology standpoint he does not have anything that would warrant chronic opioid treatment.  He could be having some pain from a myofascial standpoint as well as central pain syndrome from prior stroke but this is less likely.  At this point I really do not have much to offer and I think he would be better served in a chronic pain management program with pain psychology support and non-opioid medications and continue to work with physical therapy and core strengthening.    Meds & Orders: No orders of the defined types were placed in this encounter.  No orders of the defined types were placed in this encounter.   Follow-up: No follow-ups on file.   Procedures: No procedures performed  No notes on file   Clinical History: MRI LUMBAR SPINE WITHOUT CONTRAST  TECHNIQUE: Multiplanar, multisequence MR imaging of the lumbar spine was performed. No intravenous contrast was administered.  COMPARISON:  MRI lumbar spine 02/22/2014  FINDINGS: Segmentation:  Standard.  Alignment:  Maintained.  Vertebrae:  No fracture or worrisome lesion.  Conus medullaris and cauda equina: Conus extends to the T12-L1 level. Conus and cauda equina appear normal.  Paraspinal and other soft tissues: Negative.  Disc levels:  T11-12 is imaged in the sagittal plane only and negative.  T12-L1: Negative.  L1-2: Negative.  L2-3: Negative.  L3-4: Shallow broad-based disc bulge without central canal stenosis. Mild bilateral foraminal narrowing is more notable on the right. The appearance is unchanged.  L4-5: Annular fissure and a shallow broad-based central protrusion with slight caudal extension are identified. There is mild narrowing in the subarticular recesses. Mild bilateral foraminal narrowing is also seen. The appearance is unchanged.  L5-S1: Minimal disc bulge without central canal or foraminal stenosis.  IMPRESSION: No new  abnormality since the prior MRI.  Shallow broad-based disc bulge at L3-4 without central canal stenosis. There is mild bilateral foraminal narrowing at this level.  Mild narrowing in the subarticular recesses and foramina at L4-5 where there is a shallow broad-based central protrusion and annular fissure.   Electronically Signed   By: Inge Rise M.D.   On: 10/02/2017 15:35     IMPRESSION: Abnormal MRI scan of the lumbar spine showing mild disc protrusions at L3-4 and L4-5 and by foraminal narrowing but without definite compression. Overall no significant change compared with previous MRI scan dated 02/21/2013.   He reports that he has been smoking cigarettes.  He has a 7.25 pack-year smoking history. He has never used smokeless tobacco.  Recent Labs    04/03/17 0919  HGBA1C 5.7    Objective:  VS:  HT:    WT:   BMI:     BP:(!) 146/110  HR:79bpm  TEMP:98.2 F (36.8 C)(Oral)  RESP:99 % Physical Exam  Constitutional: He is oriented to person, place, and time. He appears well-developed and well-nourished. No distress.  HENT:  Head: Normocephalic and atraumatic.  Nose: Nose normal.  Mouth/Throat: Oropharynx is clear and moist.  Eyes: Pupils are equal, round, and reactive to light. Conjunctivae are normal.  Neck: Neck supple. No JVD present. No tracheal deviation present.  Cardiovascular: Regular rhythm and intact distal pulses.  Pulmonary/Chest: Effort normal and breath sounds normal.  Abdominal: Soft. He exhibits no distension. There is no rebound and no guarding.  Musculoskeletal: He exhibits no deformity.  Patient ambulates with an antalgic gait to the right.  He has a negative Trendelenburg sign is no pain with hip rotation.  He has good distal strength without clonus.  He has pain with palpation along the lumbar sacral spine and across the PSIS with really exaggerated tenderness to palpation.  Neurological: He is alert and oriented to person, place, and time.  He exhibits normal muscle tone. Coordination normal.  Skin: Skin is warm. No rash noted.  Psychiatric: He has a normal mood and affect. His behavior is normal.  Nursing note and vitals reviewed.   Ortho Exam Imaging: No results found.  Past Medical/Family/Surgical/Social History: Medications & Allergies reviewed per EMR, new medications updated. Patient Active Problem List   Diagnosis Date Noted  . Stroke ([place])   . Small vessel disease ([place])   . Schizophrenia ([place])   . Migraine   . History of nuclear stress test   . History of alcohol abuse   . GERD (gastroesophageal reflux disease)   . Dysrhythmia   . Chronic lower back pain   . Anxiety   . Arthritis of knee, right 04/12/2017  . Bipolar disorder ([place]) 04/03/2017  . Lipoma of forehead 09/22/2016  . Trigger ring finger of right hand 06/22/2016  . Paroxysmal atrial fibrillation (HCC)   . AF (atrial fibrillation) ([place]) 12/10/2015  . Allergic rhinoconjunctivitis of left eye 07/10/2015  . Eczema 07/10/2015  . Sweating 07/10/2015  . History of CVA (cerebrovascular accident) 05/04/2015  . PAF (paroxysmal atrial fibrillation) ([place]) 03/27/2015  . Tear of medial meniscus of right knee 03/18/2015  . Chronic pain of right knee 03/12/2015  . Other and unspecified hyperlipidemia 11/25/2013  . Nasal congestion 11/25/2013  . Sinusitis, chronic 05/09/2013  . Chronic low back pain 10/12/2012  . Sciatica neuralgia 10/12/2012  . Hypertension 10/12/2012  . Depression 10/12/2012  . Insomnia 10/12/2012   Past Medical History:  Diagnosis Date  . Anxiety   . Bipolar disorder ([place])   . Chronic lower back pain   . Depression   . Dysrhythmia    a-fib  . GERD (gastroesophageal reflux disease)   . History of alcohol abuse   . History of nuclear stress test    Myoview 10/16: EF 50%, diaphragmatic attenuation, no ischemia, low risk  . Hypertension   . Migraine 2012-2014  . PAF (paroxysmal atrial fibrillation) ([place]) 03/27/2015   a. Myoview  neg for ischemia >> Flecainide started 10/16 >> FU ETT   . Schizophrenia ([place])   . Sciatica neuralgia   . Small vessel disease ([place])    Right basal ganglia stroke  . Stroke [facility]) "between 2012-2014"   residual "AF" (09/22/2015)   Family History  Problem Relation Age of Onset  . Heart disease Father   . Schizophrenia Sister    Past Surgical History:  Procedure Laterality Date  . ATRIAL FIBRILLATION ABLATION  09/22/2015  . CYST EXCISION  1996-97   surgery back of head   . ELECTROPHYSIOLOGIC STUDY N/A 09/22/2015   Procedure: Atrial Fibrillation Ablation;  Surgeon: [employer],  MD;  Location: [place] CV LAB;  Service: Cardiovascular;  Laterality: N/A;  . ELECTROPHYSIOLOGIC STUDY N/A 12/10/2015   Procedure: Atrial Fibrillation Ablation;  Surgeon: Will [name], MD;  Location: [place] [facility];  Service: Cardiovascular;  Laterality: N/A;  . ELECTROPHYSIOLOGIC STUDY N/A 12/11/2015   Procedure: Cardioversion;  Surgeon: Will [name], MD;  Location: [place] [facility] LAB;  Service: Cardiovascular;  Laterality: N/A;  . EXCISION MASS HEAD N/A 01/06/2017   Procedure: EXCISION MASS FOREHEAD;  Surgeon: Irene Limbo, MD;  Location: [place];  Service: Plastics;  Laterality: N/A;  . GANGLION CYST EXCISION Left   . INTERCOSTAL NERVE BLOCK  2005  . KNEE ARTHROSCOPY Right 2016   Social History   Occupational History    Comment: disabled  Tobacco Use  . Smoking status: Current Some Day Smoker    Packs/day: 0.25    Years: 29.00    Pack years: 7.25    Types: Cigarettes    Last attempt to quit: 08/19/2015    Years since quitting: 2.2  . Smokeless tobacco: Never Used  Substance and Sexual Activity  . Alcohol use: Yes    Alcohol/week: 0.0 oz    Comment: drinks social  . Drug use: Yes    Types: Marijuana    Comment: smoked marijuana 01-02-17 for pain  . Sexual activity: Not Currently

## 2017-12-04 ENCOUNTER — Emergency Department (HOSPITAL_COMMUNITY): Payer: Medicaid Other

## 2017-12-04 ENCOUNTER — Encounter (HOSPITAL_COMMUNITY): Payer: Self-pay

## 2017-12-04 ENCOUNTER — Other Ambulatory Visit: Payer: Self-pay

## 2017-12-04 ENCOUNTER — Emergency Department (HOSPITAL_COMMUNITY)
Admission: EM | Admit: 2017-12-04 | Discharge: 2017-12-04 | Disposition: A | Discharge status: Home / Self Care | Payer: Medicaid Other | Attending: Emergency Medicine | Admitting: Emergency Medicine

## 2017-12-04 DIAGNOSIS — Z8673 Personal history of transient ischemic attack (TIA), and cerebral infarction without residual deficits: Secondary | ICD-10-CM | POA: Diagnosis not present

## 2017-12-04 DIAGNOSIS — L03012 Cellulitis of left finger: Secondary | ICD-10-CM | POA: Diagnosis not present

## 2017-12-04 DIAGNOSIS — Z79899 Other long term (current) drug therapy: Secondary | ICD-10-CM | POA: Insufficient documentation

## 2017-12-04 DIAGNOSIS — F17210 Nicotine dependence, cigarettes, uncomplicated: Secondary | ICD-10-CM | POA: Diagnosis not present

## 2017-12-04 DIAGNOSIS — I10 Essential (primary) hypertension: Secondary | ICD-10-CM | POA: Insufficient documentation

## 2017-12-04 DIAGNOSIS — F319 Bipolar disorder, unspecified: Secondary | ICD-10-CM | POA: Insufficient documentation

## 2017-12-04 DIAGNOSIS — F419 Anxiety disorder, unspecified: Secondary | ICD-10-CM | POA: Insufficient documentation

## 2017-12-04 DIAGNOSIS — F209 Schizophrenia, unspecified: Secondary | ICD-10-CM | POA: Insufficient documentation

## 2017-12-04 DIAGNOSIS — R2232 Localized swelling, mass and lump, left upper limb: Secondary | ICD-10-CM | POA: Diagnosis present

## 2017-12-04 MED ORDER — SULFAMETHOXAZOLE-TRIMETHOPRIM 800-160 MG PO TABS: 1.0000 | ORAL_TABLET | Freq: Two times a day (BID) | ORAL | 0 refills | Status: DC

## 2017-12-04 MED ORDER — LIDOCAINE HCL (PF) 1 % IJ SOLN
5.0000 mL | Freq: Once | INTRAMUSCULAR | Status: AC
  Administered 2017-12-04: 5 mL
  Filled 2017-12-04: qty 30

## 2017-12-04 MED ORDER — CEPHALEXIN 500 MG PO CAPS: 500.0000 mg | ORAL_CAPSULE | Freq: Three times a day (TID) | ORAL | 0 refills | Status: DC

## 2017-12-04 NOTE — ED Notes (Signed)
Patient transported to X-ray 

## 2017-12-04 NOTE — ED Provider Notes (Signed)
[facility] DEPT Provider Note   CSN: [id number] Arrival date & time: 12/04/17  0848     History   Chief Complaint Chief Complaint  Patient presents with  . left thumb swelling    HPI Jason Stokes is a 57 y.o. male.  HPI Patient presents with left thumb pain and swelling.  Around 4 days ago was helping to change flat tire thinks he could have gotten some metal in his thumb.  Since then has had more pain and swelling over the area of the left thumb near the nail.  Also swollen more proximally on the thumb.  No fevers.  No drainage.  States he was going to hit up a needle and put it in but did not do it.  He is on Eliquis for atrial fibrillation. Past Medical History:  Diagnosis Date  . Anxiety   . Bipolar disorder ([place])   . Chronic lower back pain   . Depression   . Dysrhythmia    a-fib  . GERD (gastroesophageal reflux disease)   . History of alcohol abuse   . History of nuclear stress test    Myoview 10/16: EF 50%, diaphragmatic attenuation, no ischemia, low risk  . Hypertension   . Migraine 2012-2014  . PAF (paroxysmal atrial fibrillation) ([place]) 03/27/2015   a. Myoview neg for ischemia >> Flecainide started 10/16 >> FU ETT   . Schizophrenia ([place])   . Sciatica neuralgia   . Small vessel disease ([place])    Right basal ganglia stroke  . Stroke [facility]) "between 2012-2014"   residual "AF" (09/22/2015)    Patient Active Problem List   Diagnosis Date Noted  . Stroke ([place])   . Small vessel disease ([place])   . Schizophrenia ([place])   . Migraine   . History of nuclear stress test   . History of alcohol abuse   . GERD (gastroesophageal reflux disease)   . Dysrhythmia   . Chronic lower back pain   . Anxiety   . Arthritis of knee, right 04/12/2017  . Bipolar disorder ([place]) 04/03/2017  . Lipoma of forehead 09/22/2016  . Trigger ring finger of right hand 06/22/2016  . Paroxysmal atrial fibrillation (HCC)   . AF (atrial fibrillation) ([place]) 12/10/2015   . Allergic rhinoconjunctivitis of left eye 07/10/2015  . Eczema 07/10/2015  . Sweating 07/10/2015  . History of CVA (cerebrovascular accident) 05/04/2015  . PAF (paroxysmal atrial fibrillation) ([place]) 03/27/2015  . Tear of medial meniscus of right knee 03/18/2015  . Chronic pain of right knee 03/12/2015  . Other and unspecified hyperlipidemia 11/25/2013  . Nasal congestion 11/25/2013  . Sinusitis, chronic 05/09/2013  . Chronic low back pain 10/12/2012  . Sciatica neuralgia 10/12/2012  . Hypertension 10/12/2012  . Depression 10/12/2012  . Insomnia 10/12/2012    Past Surgical History:  Procedure Laterality Date  . ATRIAL FIBRILLATION ABLATION  09/22/2015  . CYST EXCISION  1996-97   surgery back of head   . ELECTROPHYSIOLOGIC STUDY N/A 09/22/2015   Procedure: Atrial Fibrillation Ablation;  Surgeon: Will [name], MD;  Location: [facility];  Service: Cardiovascular;  Laterality: N/A;  . ELECTROPHYSIOLOGIC STUDY N/A 12/10/2015   Procedure: Atrial Fibrillation Ablation;  Surgeon: Will [name], MD;  Location: [facility];  Service: Cardiovascular;  Laterality: N/A;  . ELECTROPHYSIOLOGIC STUDY N/A 12/11/2015   Procedure: Cardioversion;  Surgeon: Will [name], MD;  Location: [facility];  Service: Cardiovascular;  Laterality: N/A;  . EXCISION MASS  HEAD N/A 01/06/2017   Procedure: EXCISION MASS FOREHEAD;  Surgeon: Irene Limbo, MD;  Location: [facility];  Service: Plastics;  Laterality: N/A;  . GANGLION CYST EXCISION Left   . INTERCOSTAL NERVE BLOCK  2005  . KNEE ARTHROSCOPY Right 2016        Home Medications    Prior to Admission medications   Medication Sig Start Date End Date Taking? Authorizing Provider  albuterol (PROVENTIL HFA;VENTOLIN HFA) 108 (90 Base) MCG/ACT inhaler Inhale 2 puffs into the lungs every 6 (six) hours as needed for wheezing or shortness of breath. 10/31/17  Yes Charlott Rakes, MD  carvedilol (COREG) 12.5  MG tablet Take 1 tablet (12.5 mg total) by mouth 2 (two) times daily. 12/20/16  Yes Camnitz, Will Hassell Done, MD  cetirizine (ZYRTEC) 10 MG tablet Take 1 tablet (10 mg total) by mouth daily. Patient taking differently: Take 10 mg by mouth daily as needed for allergies.  10/31/17  Yes Newlin, Charlane Ferretti, MD  CYMBALTA 60 MG capsule Take 60 mg by mouth daily. 04/29/14  Yes [provider]  diltiazem (CARDIZEM CD) 300 MG 24 hr capsule TAKE 1 CAPSULE (300 MG TOTAL) BY MOUTH DAILY. 11/28/17  Yes Camnitz, Will Hassell Done, MD  ELIQUIS 5 MG TABS tablet TAKE 1 TABLET (5 MG TOTAL) BY MOUTH 2 (TWO) TIMES DAILY. 08/23/17  Yes Camnitz, Will Hassell Done, MD  fluticasone (FLONASE) 50 MCG/ACT nasal spray Place 2 sprays into both nostrils daily. Patient taking differently: Place 2 sprays into both nostrils daily as needed for allergies.  10/31/17  Yes Charlott Rakes, MD  hydrALAZINE (APRESOLINE) 100 MG tablet Take 1 tablet (100 mg total) by mouth 3 (three) times daily. Patient taking differently: Take 100 mg by mouth 2 (two) times daily.  12/20/16 12/04/17 Yes Camnitz, Will Hassell Done, MD  ketotifen (ZADITOR) 0.025 % ophthalmic solution Place 1 drop into both eyes 2 (two) times daily. 09/22/16  Yes Funches, Josalyn, MD  lidocaine (LIDODERM) 5 % Place 1 patch onto the skin daily. Remove & Discard patch within 12 hours or as directed by MD 10/31/17  Yes Newlin, Enobong, MD  montelukast (SINGULAIR) 10 MG tablet TAKE 1 TABLET BY MOUTH EVERYDAY AT BEDTIME 09/11/17  Yes Newlin, Enobong, MD  olopatadine (PATANOL) 0.1 % ophthalmic solution INSTILL 1 DROP INTO BOTH EYES TWICE A DAY 11/27/17  Yes Newlin, Enobong, MD  omeprazole (PRILOSEC) 20 MG capsule TAKE 1 CAPSULE BY MOUTH EVERY DAY 11/14/17  Yes Newlin, Enobong, MD  oxyCODONE (OXY IR/ROXICODONE) 5 MG immediate release tablet Take 5 mg by mouth every 6 (six) hours as needed for severe pain.  12/01/17  Yes [provider]  prazosin (MINIPRESS) 1 MG capsule TAKE 1 CAPSULE BY MOUTH EVERYDAY AT  BEDTIME 09/04/17  Yes [provider]  traZODone (DESYREL) 100 MG tablet Take 100-200 mg by mouth at bedtime as needed for sleep.  10/22/15  Yes [provider]  VOLTAREN 1 % GEL Apply 4 g topically 4 (four) times daily. Patient taking differently: Apply 4 g topically 2 (two) times daily as needed (knee pain).  08/29/17  Yes Charlott Rakes, MD  benzonatate (TESSALON) 100 MG capsule Take 1 capsule (100 mg total) by mouth 2 (two) times daily as needed for cough. Patient not taking: Reported on 10/31/2017 07/20/17   Charlott Rakes, MD  cephALEXin (KEFLEX) 500 MG capsule Take 1 capsule (500 mg total) by mouth 3 (three) times daily. 12/04/17   Davonna Belling, MD  HYDROcodone-acetaminophen (NORCO) 5-325 MG tablet Take 1-2 tablets by  mouth every 6 (six) hours as needed for moderate pain. One to two tabs every 4-6 hours for pain Patient not taking: Reported on 12/04/2017 07/24/17   Pete Pelt, PA-C  oxyCODONE-acetaminophen (ROXICET) 5-325 MG tablet Take 1-2 tablets by mouth every 4 (four) hours as needed for severe pain. Patient not taking: Reported on 10/31/2017 01/06/17   Irene Limbo, MD  sulfamethoxazole-trimethoprim (BACTRIM DS,SEPTRA DS) 800-160 MG tablet Take 1 tablet by mouth 2 (two) times daily. 12/04/17   Davonna Belling, MD  triamcinolone (NASACORT AQ) 55 MCG/ACT AERO nasal inhaler Place 2 sprays into the nose daily. Patient not taking: Reported on 12/04/2017 09/22/16   Boykin Nearing, MD  triamcinolone ointment (KENALOG) 0.5 % Apply 1 application topically 2 (two) times daily. Patient not taking: Reported on 12/04/2017 07/10/15   Boykin Nearing, MD    Family History Family History  Problem Relation Age of Onset  . Heart disease Father   . Schizophrenia Sister     Social History Social History   Tobacco Use  . Smoking status: Current Some Day Smoker    Packs/day: 0.25    Years: 29.00    Pack years: 7.25    Types: Cigarettes    Last attempt to quit: 08/19/2015      Years since quitting: 2.2  . Smokeless tobacco: Never Used  Substance Use Topics  . Alcohol use: Yes    Alcohol/week: 0.0 oz    Comment: drinks social  . Drug use: Yes    Types: Marijuana    Comment: smoked marijuana 01-02-17 for pain     Allergies   Lisinopril   Review of Systems Review of Systems  Constitutional: Negative for appetite change and fever.  Musculoskeletal:       Left thumb swelling.  Skin: Negative for rash.  Neurological: Negative for numbness.  Hematological: Does not bruise/bleed easily.  Psychiatric/Behavioral: Negative for confusion.     Physical Exam Updated Vital Signs BP (!) 156/108 (BP Location: Right Arm)   Pulse 68   Temp 98.4 F (36.9 C) (Oral)   Resp 17   Ht 6\' 1"  (1.854 m)   Wt 115.2 kg (254 lb)   SpO2 100%   BMI 33.51 kg/m   Physical Exam  Constitutional: He appears well-developed.  HENT:  Head: Normocephalic.  Neck: Neck supple.  Cardiovascular: Normal rate.  Musculoskeletal: He exhibits tenderness.  Some swelling of left thumb.  Does have swelling over proximal phalanx and does have more fluctuant area near the nail particularly on the medial aspect.  No drainage.  Sensation intact.  Neurological: He is alert.  Skin: Skin is warm. Capillary refill takes less than 2 seconds.     ED Treatments / Results  Labs (all labs ordered are listed, but only abnormal results are displayed) Labs Reviewed - No data to display  EKG None  Radiology Dg Finger Thumb Left  Result Date: 12/04/2017 CLINICAL DATA:  Left thumb swelling.  Possible foreign body. EXAM: LEFT THUMB 2+V COMPARISON:  None. FINDINGS: There are 2 adjacent linear radiopaque densities measuring 1 mm each in the subcutaneous soft tissues of the volar aspect of the thumb at the level of the midshaft of the proximal phalanx. There is diffuse soft tissue swelling of the thumb. No acute fracture or dislocation is identified. Minimal degenerative spurring is noted at the  first MCP and thumb IP joints. IMPRESSION: 1. Two 1 mm foreign bodies in the subcutaneous tissues of the right thumb. 2. No acute osseous abnormality. Electronically Signed  By: Logan Bores M.D.   On: 12/04/2017 11:53    Procedures .Marland KitchenIncision and Drainage Date/Time: 12/04/2017 12:59 PM Performed by: Davonna Belling, MD Authorized by: Davonna Belling, MD   Consent:    Consent obtained:  Verbal   Consent given by:  Patient   Risks discussed:  Bleeding, incomplete drainage, pain and infection   Alternatives discussed:  No treatment, alternative treatment and delayed treatment Location:    Indications for incision and drainage: Paronychia to left medial thumb.   Location:  Upper extremity   Upper extremity location:  Finger   Finger location:  L thumb Pre-procedure details:    Skin preparation:  Betadine Anesthesia (see MAR for exact dosages):    Anesthesia method:  Nerve block   Block location:  Digital   Block needle gauge:  25 G   Block anesthetic:  Lidocaine 1% w/o epi   Block injection procedure:  Anatomic landmarks identified, introduced needle, incremental injection and negative aspiration for blood   Block outcome:  Incomplete block Procedure type:    Complexity:  Simple Procedure details:    Incision types:  Stab incision (Incision to nail fold area and stab incision medial to this out lateral finger.)   Wound management:  Probed and deloculated   Drainage:  Purulent   Drainage amount:  Moderate   Wound treatment:  Wound left open   Packing materials:  None Post-procedure details:    Patient tolerance of procedure:  Tolerated well, no immediate complications   (including critical care time)  Medications Ordered in ED Medications  lidocaine (PF) (XYLOCAINE) 1 % injection 5 mL (5 mLs Infiltration Given by Other 12/04/17 1038)     Initial Impression / Assessment and Plan / ED Course  I have reviewed the triage vital signs and the nursing notes.  Pertinent labs  & imaging results that were available during my care of the patient were reviewed by me and considered in my medical decision making (see chart for details).     Patient with paronychia and cellulitis.  Drained.  Does have potential foreign body over the proximal phalanx of the thumb.  These are of unknown chronicity and may not be due to the recent episode.  Paronychia drained.  Will discharge home with antibiotics.  Discussed with Hilbert Odor, covering for Dr. Grandville Silos.  Final Clinical Impressions(s) / ED Diagnoses   Final diagnoses:  Paronychia of left thumb    ED Discharge Orders        Ordered    cephALEXin (KEFLEX) 500 MG capsule  3 times daily     12/04/17 1256    sulfamethoxazole-trimethoprim (BACTRIM DS,SEPTRA DS) 800-160 MG tablet  2 times daily     12/04/17 1256       Davonna Belling, MD 12/04/17 1301

## 2017-12-04 NOTE — ED Triage Notes (Signed)
Patient reports that he was changing a tire 4 days ago and is now having left thumb pain and swelling.

## 2017-12-07 ENCOUNTER — Encounter: Payer: Self-pay | Admitting: Family Medicine

## 2017-12-07 ENCOUNTER — Ambulatory Visit: Payer: Medicaid Other | Attending: Family Medicine | Admitting: Family Medicine

## 2017-12-07 VITALS — BP 124/76 | HR 91 | Temp 97.7°F | Ht 73.0 in | Wt 248.8 lb

## 2017-12-07 DIAGNOSIS — F319 Bipolar disorder, unspecified: Secondary | ICD-10-CM | POA: Insufficient documentation

## 2017-12-07 DIAGNOSIS — M5116 Intervertebral disc disorders with radiculopathy, lumbar region: Secondary | ICD-10-CM | POA: Diagnosis not present

## 2017-12-07 DIAGNOSIS — I480 Paroxysmal atrial fibrillation: Secondary | ICD-10-CM | POA: Insufficient documentation

## 2017-12-07 DIAGNOSIS — Z888 Allergy status to other drugs, medicaments and biological substances status: Secondary | ICD-10-CM | POA: Diagnosis not present

## 2017-12-07 DIAGNOSIS — H5789 Other specified disorders of eye and adnexa: Secondary | ICD-10-CM | POA: Insufficient documentation

## 2017-12-07 DIAGNOSIS — M5441 Lumbago with sciatica, right side: Secondary | ICD-10-CM | POA: Diagnosis not present

## 2017-12-07 DIAGNOSIS — K219 Gastro-esophageal reflux disease without esophagitis: Secondary | ICD-10-CM | POA: Insufficient documentation

## 2017-12-07 DIAGNOSIS — I10 Essential (primary) hypertension: Secondary | ICD-10-CM | POA: Insufficient documentation

## 2017-12-07 DIAGNOSIS — F209 Schizophrenia, unspecified: Secondary | ICD-10-CM | POA: Diagnosis not present

## 2017-12-07 DIAGNOSIS — E119 Type 2 diabetes mellitus without complications: Secondary | ICD-10-CM | POA: Insufficient documentation

## 2017-12-07 DIAGNOSIS — Z8673 Personal history of transient ischemic attack (TIA), and cerebral infarction without residual deficits: Secondary | ICD-10-CM | POA: Insufficient documentation

## 2017-12-07 DIAGNOSIS — Z79891 Long term (current) use of opiate analgesic: Secondary | ICD-10-CM | POA: Insufficient documentation

## 2017-12-07 DIAGNOSIS — Z79899 Other long term (current) drug therapy: Secondary | ICD-10-CM | POA: Insufficient documentation

## 2017-12-07 DIAGNOSIS — F419 Anxiety disorder, unspecified: Secondary | ICD-10-CM | POA: Insufficient documentation

## 2017-12-07 DIAGNOSIS — G8929 Other chronic pain: Secondary | ICD-10-CM | POA: Diagnosis not present

## 2017-12-07 DIAGNOSIS — J302 Other seasonal allergic rhinitis: Secondary | ICD-10-CM

## 2017-12-07 DIAGNOSIS — Z9889 Other specified postprocedural states: Secondary | ICD-10-CM | POA: Diagnosis not present

## 2017-12-08 ENCOUNTER — Telehealth: Payer: Self-pay | Admitting: Family Medicine

## 2017-12-08 ENCOUNTER — Encounter: Payer: Self-pay | Admitting: Family Medicine

## 2017-12-08 NOTE — Telephone Encounter (Signed)
Call placed to patient [phone], regarding PCS form. No answer. Left patient a message asking him to return my call at [phone].  During office visit with provider yesterday 5/23, patient handed PCS service form to Dr. Margarita Rana to fill out as he was requesting more personal care services hours. Unfortunately since patient's medical condition have not change, request has been denied.   Faxed patient's PCS form to [facility] [phone].

## 2017-12-08 NOTE — Progress Notes (Signed)
Subjective:  Patient ID: Jason Stokes, male    DOB: 13-Jun-1961  Age: 57 y.o. MRN: [id number]  CC: Hypertension   HPI Jason Stokes is a 57 year old male with a history of hypertension, type 2 diabetes mellitus (A1c 5.7), bipolar disorder, paroxysmal A. 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 He states his Singulair and Flonase are not working and "I need Allergy shots". He has had teary eyes, itchy eyes, runny nose. He also brings in a PCS from to be completed because he needs more hours as his fiance will be  Going back to work full time. He has chronic low back pain and has had Epidural spinal injections in the past and informs me his spine doctor, Dr Ernestina Patches recently told him nerves in his neck are damaged  Past Medical History:  Diagnosis Date  . Anxiety   . Bipolar disorder ([place])   . Chronic lower back pain   . Depression   . Dysrhythmia    a-fib  . GERD (gastroesophageal reflux disease)   . History of alcohol abuse   . History of nuclear stress test    Myoview 10/16: EF 50%, diaphragmatic attenuation, no ischemia, low risk  . Hypertension   . Migraine 2012-2014  . PAF (paroxysmal atrial fibrillation) ([place]) 03/27/2015   a. Myoview neg for ischemia >> Flecainide started 10/16 >> FU ETT   . Schizophrenia ([place])   . Sciatica neuralgia   . Small vessel disease ([place])    Right basal ganglia stroke  . Stroke [facility]) "between 2012-2014"   residual "AF" (09/22/2015)    Past Surgical History:  Procedure Laterality Date  . ATRIAL FIBRILLATION ABLATION  09/22/2015  . CYST EXCISION  1996-97   surgery back of head   . ELECTROPHYSIOLOGIC STUDY N/A 09/22/2015   Procedure: Atrial Fibrillation Ablation;  Surgeon: Will [name], MD;  Location: [facility];  Service: Cardiovascular;  Laterality: N/A;  . ELECTROPHYSIOLOGIC STUDY N/A 12/10/2015   Procedure: Atrial  Fibrillation Ablation;  Surgeon: Will [name], MD;  Location: [facility];  Service: Cardiovascular;  Laterality: N/A;  . ELECTROPHYSIOLOGIC STUDY N/A 12/11/2015   Procedure: Cardioversion;  Surgeon: Will [name], MD;  Location: [place] [place] [facility];  Service: Cardiovascular;  Laterality: N/A;  . EXCISION MASS HEAD N/A 01/06/2017   Procedure: EXCISION MASS FOREHEAD;  Surgeon: Irene Limbo, MD;  Location: [facility];  Service: Plastics;  Laterality: N/A;  . GANGLION CYST EXCISION Left   . INTERCOSTAL NERVE BLOCK  2005  . KNEE ARTHROSCOPY Right 2016    Allergies  Allergen Reactions  . Lisinopril Anaphylaxis    Swelling of lips and tongue.     Outpatient Medications Prior to Visit  Medication Sig Dispense Refill  . albuterol (PROVENTIL HFA;VENTOLIN HFA) 108 (90 Base) MCG/ACT inhaler Inhale 2 puffs into the lungs every 6 (six) hours as needed for wheezing or shortness of breath. 1 Inhaler 1  . carvedilol (COREG) 12.5 MG tablet Take 1 tablet (12.5 mg total) by mouth 2 (two) times daily. 60 tablet 3  . cephALEXin (KEFLEX) 500 MG capsule Take 1 capsule (500 mg total) by mouth 3 (three) times daily. 15 capsule 0  . cetirizine (ZYRTEC) 10 MG tablet Take 1 tablet (10 mg total) by mouth daily. (Patient taking differently: Take 10 mg by mouth daily as needed for allergies. ) 30 tablet 1  .  CYMBALTA 60 MG capsule Take 60 mg by mouth daily.  1  . diltiazem (CARDIZEM CD) 300 MG 24 hr capsule TAKE 1 CAPSULE (300 MG TOTAL) BY MOUTH DAILY. 90 capsule 2  . ELIQUIS 5 MG TABS tablet TAKE 1 TABLET (5 MG TOTAL) BY MOUTH 2 (TWO) TIMES DAILY. 60 tablet 5  . fluticasone (FLONASE) 50 MCG/ACT nasal spray Place 2 sprays into both nostrils daily. (Patient taking differently: Place 2 sprays into both nostrils daily as needed for allergies. ) 16 g 1  . ketotifen (ZADITOR) 0.025 % ophthalmic solution Place 1 drop into both eyes 2 (two) times daily. 10 mL 1  . lidocaine (LIDODERM) 5 %  Place 1 patch onto the skin daily. Remove & Discard patch within 12 hours or as directed by MD 30 patch 2  . montelukast (SINGULAIR) 10 MG tablet TAKE 1 TABLET BY MOUTH EVERYDAY AT BEDTIME 30 tablet 2  . olopatadine (PATANOL) 0.1 % ophthalmic solution INSTILL 1 DROP INTO BOTH EYES TWICE A DAY 5 mL 5  . omeprazole (PRILOSEC) 20 MG capsule TAKE 1 CAPSULE BY MOUTH EVERY DAY 90 capsule 0  . oxyCODONE (OXY IR/ROXICODONE) 5 MG immediate release tablet Take 5 mg by mouth every 6 (six) hours as needed for severe pain.   0  . prazosin (MINIPRESS) 1 MG capsule TAKE 1 CAPSULE BY MOUTH EVERYDAY AT BEDTIME  2  . sulfamethoxazole-trimethoprim (BACTRIM DS,SEPTRA DS) 800-160 MG tablet Take 1 tablet by mouth 2 (two) times daily. 10 tablet 0  . traZODone (DESYREL) 100 MG tablet Take 100-200 mg by mouth at bedtime as needed for sleep.   2  . VOLTAREN 1 % GEL Apply 4 g topically 4 (four) times daily. (Patient taking differently: Apply 4 g topically 2 (two) times daily as needed (knee pain). ) 100 g 2  . benzonatate (TESSALON) 100 MG capsule Take 1 capsule (100 mg total) by mouth 2 (two) times daily as needed for cough. (Patient not taking: Reported on 10/31/2017) 20 capsule 0  . hydrALAZINE (APRESOLINE) 100 MG tablet Take 1 tablet (100 mg total) by mouth 3 (three) times daily. (Patient taking differently: Take 100 mg by mouth 2 (two) times daily. ) 270 tablet 3  . HYDROcodone-acetaminophen (NORCO) 5-325 MG tablet Take 1-2 tablets by mouth every 6 (six) hours as needed for moderate pain. One to two tabs every 4-6 hours for pain (Patient not taking: Reported on 12/04/2017) 20 tablet 0  . oxyCODONE-acetaminophen (ROXICET) 5-325 MG tablet Take 1-2 tablets by mouth every 4 (four) hours as needed for severe pain. (Patient not taking: Reported on 10/31/2017) 20 tablet 0  . triamcinolone (NASACORT AQ) 55 MCG/ACT AERO nasal inhaler Place 2 sprays into the nose daily. (Patient not taking: Reported on 12/04/2017) 1 Inhaler 12  .  triamcinolone ointment (KENALOG) 0.5 % Apply 1 application topically 2 (two) times daily. (Patient not taking: Reported on 12/04/2017) 30 g 0   No facility-administered medications prior to visit.     ROS Review of Systems  Constitutional: Negative for activity change and appetite change.  HENT: Positive for rhinorrhea and sneezing. Negative for sinus pressure and sore throat.   Eyes: Positive for itching. Negative for visual disturbance.  Respiratory: Negative for cough, chest tightness and shortness of breath.   Cardiovascular: Negative for chest pain and leg swelling.  Gastrointestinal: Negative for abdominal distention, abdominal pain, constipation and diarrhea.  Endocrine: Negative.   Genitourinary: Negative for dysuria.  Musculoskeletal: Positive for back pain. Negative for joint swelling  and myalgias.  Skin: Negative for rash.  Allergic/Immunologic: Negative.   Neurological: Negative for weakness, light-headedness and numbness.  Psychiatric/Behavioral: Negative for dysphoric mood and suicidal ideas.     Objective:  BP 124/76   Pulse 91   Temp 97.7 F (36.5 C) (Oral)   Ht 6\' 1"  (1.854 m)   Wt 248 lb 12.8 oz (112.9 kg)   SpO2 98%   BMI 32.83 kg/m   BP/Weight 12/07/2017 12/04/2017 1/44/8185  Systolic BP 631 497 026  Diastolic BP 76 378 89  Wt. (Lbs) 248.8 254 255  BMI 32.83 33.51 33.64      Physical Exam  Constitutional: He is oriented to person, place, and time. He appears well-developed and well-nourished.  Cardiovascular: Normal rate, normal heart sounds and intact distal pulses.  No murmur heard. Pulmonary/Chest: Effort normal and breath sounds normal. He has no wheezes. He has no rales. He exhibits no tenderness.  Abdominal: Soft. Bowel sounds are normal. He exhibits no distension and no mass. There is no tenderness.  Musculoskeletal: He exhibits tenderness (TTP of lumbar spine; b/l positive straight leg raise).  Neurological: He is alert and oriented to person,  place, and time.     Assessment & Plan:   1. Seasonal allergies Uncontrolled Continue Singulair and Flonase - Ambulatory referral to Allergy  2. Chronic left-sided low back pain with right-sided sciatica We will complete his PCS form for him.   No orders of the defined types were placed in this encounter.   Follow-up: Return for follow up of chronic medical conditions, keep previously scheduled appointment.   Charlott Rakes MD

## 2017-12-14 ENCOUNTER — Telehealth: Payer: Self-pay | Admitting: Cardiology

## 2017-12-14 NOTE — Telephone Encounter (Signed)
New Message    Patient is calling for several things. He advises that he needs an appointment prior to June 8th.

## 2017-12-19 ENCOUNTER — Ambulatory Visit: Payer: Medicaid Other | Admitting: Physician Assistant

## 2018-01-03 ENCOUNTER — Institutional Professional Consult (permissible substitution): Payer: Medicaid Other | Admitting: Neurology

## 2018-01-08 ENCOUNTER — Ambulatory Visit (INDEPENDENT_AMBULATORY_CARE_PROVIDER_SITE_OTHER): Payer: Medicaid Other | Admitting: Neurology

## 2018-01-08 ENCOUNTER — Encounter: Payer: Self-pay | Admitting: Neurology

## 2018-01-08 VITALS — BP 137/92 | HR 86 | Ht 73.0 in | Wt 248.5 lb

## 2018-01-08 DIAGNOSIS — R202 Paresthesia of skin: Secondary | ICD-10-CM | POA: Diagnosis not present

## 2018-01-08 NOTE — Patient Instructions (Signed)
   We will get EMG and NCV study on the arms to look at the nerve function.

## 2018-01-08 NOTE — Progress Notes (Signed)
Reason for visit: Tremors  Referring physician: Dr. Mardee Stokes is a 57 y.o. male  History of present illness:  Jason Stokes is a 57 year old right-handed black male with a history of chronic low back pain and left leg pain and numbness.  The patient has been seen and evaluated through this office previously since 2014.  At that time, the patient complained of chronic low back pain and pain down the right leg with eventual pain went down the left leg.  The patient initially presented with violent extensor tremors affecting the right leg, this eventually started causing similar problems on the left leg.  This would occur day and night.  The tremors were felt to be factitious in nature.  The patient did undergo EMG and nerve conduction studies that showed a mild right carpal tunnel syndrome, EMG and nerve conduction studies of both lower extremities were normal at that time.  The patient currently is followed through [facility] for his chronic low back pain, he indicates that when he recently had an epidural steroid injection in the low back he began having tremors in both legs again.  The patient indicates that now the episodes occur at nighttime when he lies down, they do not occur when he is up and walking around.  The patient has been told by his wife that the extensor jerks of the legs occur throughout the night.  When he gets up and walks around, this improves the symptoms.  The patient continues to have numbness down the right greater than left lower extremities, he more recently within the last month developed some neck pain and intermittent tingling down the arms.  He denies any true weakness.  He has had some slight gait instability, no falls.  He denies issues controlling the bowels or the bladder.  He returns for further evaluation.  He has a prior history of polysubstance abuse including cocaine.  Past Medical History:  Diagnosis Date  . Anxiety   . Bipolar disorder  ([place])   . Chronic lower back pain   . Depression   . Dysrhythmia    a-fib  . GERD (gastroesophageal reflux disease)   . History of alcohol abuse   . History of nuclear stress test    Myoview 10/16: EF 50%, diaphragmatic attenuation, no ischemia, low risk  . Hypertension   . Migraine 2012-2014  . PAF (paroxysmal atrial fibrillation) ([place]) 03/27/2015   a. Myoview neg for ischemia >> Flecainide started 10/16 >> FU ETT   . Schizophrenia ([place])   . Sciatica neuralgia   . Small vessel disease ([place])    Right basal ganglia stroke  . Stroke [facility]) "between 2012-2014"   residual "AF" (09/22/2015)    Past Surgical History:  Procedure Laterality Date  . ATRIAL FIBRILLATION ABLATION  09/22/2015  . CYST EXCISION  1996-97   surgery back of head   . ELECTROPHYSIOLOGIC STUDY N/A 09/22/2015   Procedure: Atrial Fibrillation Ablation;  Surgeon: Will [name], MD;  Location: [facility] [address] [facility];  Service: Cardiovascular;  Laterality: N/A;  . ELECTROPHYSIOLOGIC STUDY N/A 12/10/2015   Procedure: Atrial Fibrillation Ablation;  Surgeon: Will [name], MD;  Location: [place] [facility];  Service: Cardiovascular;  Laterality: N/A;  . ELECTROPHYSIOLOGIC STUDY N/A 12/11/2015   Procedure: Cardioversion;  Surgeon: Will [name], MD;  Location: [place] [facility];  Service: Cardiovascular;  Laterality: N/A;  . EXCISION MASS HEAD N/A 01/06/2017   Procedure: EXCISION MASS FOREHEAD;  Surgeon:  Irene Limbo, MD;  Location: [place];  Service: Plastics;  Laterality: N/A;  . GANGLION CYST EXCISION Left   . INTERCOSTAL NERVE BLOCK  2005  . KNEE ARTHROSCOPY Right 2016    Family History  Problem Relation Age of Onset  . Heart disease Father   . Schizophrenia Sister     Social history:  reports that he has been smoking cigarettes.  He has a 7.25 pack-year smoking history. He has never used smokeless tobacco. He reports that he drinks alcohol. He reports that he has current or past drug  history. Drug: Marijuana.  Medications:  Prior to Admission medications   Medication Sig Start Date End Date Taking? Authorizing Provider  albuterol (PROVENTIL HFA;VENTOLIN HFA) 108 (90 Base) MCG/ACT inhaler Inhale 2 puffs into the lungs every 6 (six) hours as needed for wheezing or shortness of breath. 10/31/17  Yes Charlott Rakes, MD  benzonatate (TESSALON) 100 MG capsule Take 1 capsule (100 mg total) by mouth 2 (two) times daily as needed for cough. 07/20/17  Yes Charlott Rakes, MD  carvedilol (COREG) 12.5 MG tablet Take 1 tablet (12.5 mg total) by mouth 2 (two) times daily. 12/20/16  Yes Camnitz, Will Hassell Done, MD  cephALEXin (KEFLEX) 500 MG capsule Take 1 capsule (500 mg total) by mouth 3 (three) times daily. 12/04/17  Yes Davonna Belling, MD  cetirizine (ZYRTEC) 10 MG tablet Take 1 tablet (10 mg total) by mouth daily. Patient taking differently: Take 10 mg by mouth daily as needed for allergies.  10/31/17  Yes Newlin, Charlane Ferretti, MD  CYMBALTA 60 MG capsule Take 60 mg by mouth daily. 04/29/14  Yes [provider]  diltiazem (CARDIZEM CD) 300 MG 24 hr capsule TAKE 1 CAPSULE (300 MG TOTAL) BY MOUTH DAILY. 11/28/17  Yes Camnitz, Will Hassell Done, MD  ELIQUIS 5 MG TABS tablet TAKE 1 TABLET (5 MG TOTAL) BY MOUTH 2 (TWO) TIMES DAILY. 08/23/17  Yes Camnitz, Will Hassell Done, MD  fluticasone (FLONASE) 50 MCG/ACT nasal spray Place 2 sprays into both nostrils daily. Patient taking differently: Place 2 sprays into both nostrils daily as needed for allergies.  10/31/17  Yes Charlott Rakes, MD  hydrALAZINE (APRESOLINE) 100 MG tablet Take 1 tablet (100 mg total) by mouth 3 (three) times daily. Patient taking differently: Take 100 mg by mouth 2 (two) times daily.  12/20/16 12/04/17  Camnitz, Ocie Doyne, MD  ketotifen (ZADITOR) 0.025 % ophthalmic solution Place 1 drop into both eyes 2 (two) times daily. 09/22/16   Funches, Adriana Mccallum, MD  lidocaine (LIDODERM) 5 % Place 1 patch onto the skin daily. Remove & Discard patch within  12 hours or as directed by MD 10/31/17   Charlott Rakes, MD  montelukast (SINGULAIR) 10 MG tablet TAKE 1 TABLET BY MOUTH EVERYDAY AT BEDTIME 09/11/17   Newlin, Enobong, MD  olopatadine (PATANOL) 0.1 % ophthalmic solution INSTILL 1 DROP INTO BOTH EYES TWICE A DAY 11/27/17   Charlott Rakes, MD  omeprazole (PRILOSEC) 20 MG capsule TAKE 1 CAPSULE BY MOUTH EVERY DAY 11/14/17   Charlott Rakes, MD  oxyCODONE (OXY IR/ROXICODONE) 5 MG immediate release tablet Take 5 mg by mouth every 6 (six) hours as needed for severe pain.  12/01/17   [provider]  oxyCODONE-acetaminophen (ROXICET) 5-325 MG tablet Take 1-2 tablets by mouth every 4 (four) hours as needed for severe pain. Patient not taking: Reported on 10/31/2017 01/06/17   Irene Limbo, MD  prazosin (MINIPRESS) 1 MG capsule TAKE 1 CAPSULE BY MOUTH EVERYDAY AT BEDTIME 09/04/17  [provider]  sulfamethoxazole-trimethoprim (BACTRIM DS,SEPTRA DS) 800-160 MG tablet Take 1 tablet by mouth 2 (two) times daily. 12/04/17   Davonna Belling, MD  traZODone (DESYREL) 100 MG tablet Take 100-200 mg by mouth at bedtime as needed for sleep.  10/22/15   [provider]  triamcinolone (NASACORT AQ) 55 MCG/ACT AERO nasal inhaler Place 2 sprays into the nose daily. Patient not taking: Reported on 12/04/2017 09/22/16   Boykin Nearing, MD  triamcinolone ointment (KENALOG) 0.5 % Apply 1 application topically 2 (two) times daily. Patient not taking: Reported on 12/04/2017 07/10/15   Boykin Nearing, MD  VOLTAREN 1 % GEL Apply 4 g topically 4 (four) times daily. Patient taking differently: Apply 4 g topically 2 (two) times daily as needed (knee pain).  08/29/17   Charlott Rakes, MD      Allergies  Allergen Reactions  . Lisinopril Anaphylaxis    Swelling of lips and tongue.    ROS:  Out of a complete 14 system review of symptoms, the patient complains only of the following symptoms, and all other reviewed systems are negative.  Weight  loss Constipation Easy bruising Joint pain, muscle cramps, aching muscles Memory loss, numbness, seizures, tremor Depression, none of sleep, change in appetite, hallucinations Snoring, restless legs  Blood pressure (!) 137/92, pulse 86, height 6\' 1"  (1.854 m), weight 248 lb 8 oz (112.7 kg).  Physical Exam  General: The patient is alert and cooperative at the time of the examination.  Eyes: Pupils are equal, round, and reactive to light. Discs are flat bilaterally.  Neck: The neck is supple, no carotid bruits are noted.  Respiratory: The respiratory examination is clear.  Cardiovascular: The cardiovascular examination reveals a regular rate and rhythm, no obvious murmurs or rubs are noted.  Neuromuscular: The patient lacks about 50 degrees of lateral rotation looking to the right, 30 degrees to the left.  Skin: Extremities are without significant edema.  Neurologic Exam  Mental status: The patient is alert and oriented x 3 at the time of the examination. The patient has apparent normal recent and remote memory, with an apparently normal attention span and concentration ability.  Cranial nerves: Facial symmetry is present. There is good sensation of the face to pinprick and soft touch bilaterally. The strength of the facial muscles and the muscles to head turning and shoulder shrug are normal bilaterally. Speech is well enunciated, no aphasia or dysarthria is noted. Extraocular movements are full. Visual fields are full. The tongue is midline, and the patient has symmetric elevation of the soft palate. No obvious hearing deficits are noted.  Motor: The motor testing reveals 5 over 5 strength of all 4 extremities. Good symmetric motor tone is noted throughout.  Sensory: Sensory testing is intact to pinprick, soft touch, vibration sensation, and position sense on all 4 extremities, with exception of decreased pin prick sensation on the left leg as compared to the right, decreased  position sense in all 4 extremities. No evidence of extinction is noted.  Coordination: Cerebellar testing reveals good finger-nose-finger and heel-to-shin bilaterally.  Gait and station: Gait is slow, slightly wide-based. Tandem gait is minimally unsteady.  Romberg is negative. No drift is seen.  Reflexes: Deep tendon reflexes are symmetric and normal bilaterally. Toes are downgoing bilaterally.   Assessment/Plan:  1.  Chronic low back pain, will left greater than right lower extremity discomfort  2.  History of factitious leg tremors, extensor spasms  3. Possible restless leg syndrome  4.  Neck pain,  intermittent paresthesias of upper extremities  The patient will be set up for nerve conduction studies on both upper extremities and EMG evaluation of the right upper extremity.  I have discussed the possibility of starting Mirapex or Requip at night for his leg jerks which likely represent restless leg syndrome or periodic limb movements.  The patient is not clear that he wants to go on a medication.  The patient will follow-up for the above study.  The patient has undergone MRI evaluation of the cervical spine 5 years ago that showed mild degenerative changes at the C3-4 and C4-5 levels.  No definite nerve root impingement was seen at that time.  Jill Alexanders MD 01/08/2018 11:29 AM  [facility] [address] [place], [place] [address]  Phone [phone] Fax [phone]

## 2018-01-09 ENCOUNTER — Telehealth: Payer: Self-pay | Admitting: Cardiology

## 2018-01-09 MED ORDER — DILTIAZEM HCL ER COATED BEADS 300 MG PO CP24: 300.0000 mg | ORAL_CAPSULE | Freq: Every day | ORAL | 1 refills | Status: DC

## 2018-01-09 NOTE — Telephone Encounter (Signed)
Pt stated he had a problem getting his Diltiazem refilled.  He was told we needed to resend Rx.  I resent Rx per request. Pt will follow up in August and appreciative of my help.

## 2018-01-09 NOTE — Telephone Encounter (Signed)
New message    Pt called to schedule appt with Dr. Curt Bears because he needed refill of medication. He said he had 3 or 4 pills left. He asked for a call from nurse about his medication. Please call.

## 2018-01-10 ENCOUNTER — Ambulatory Visit (INDEPENDENT_AMBULATORY_CARE_PROVIDER_SITE_OTHER): Payer: Medicaid Other | Admitting: Neurology

## 2018-01-10 ENCOUNTER — Telehealth: Payer: Self-pay | Admitting: Neurology

## 2018-01-10 ENCOUNTER — Encounter: Payer: Self-pay | Admitting: Neurology

## 2018-01-10 ENCOUNTER — Ambulatory Visit: Payer: Medicaid Other | Admitting: Neurology

## 2018-01-10 DIAGNOSIS — G5603 Carpal tunnel syndrome, bilateral upper limbs: Secondary | ICD-10-CM

## 2018-01-10 DIAGNOSIS — R202 Paresthesia of skin: Secondary | ICD-10-CM

## 2018-01-10 HISTORY — DX: Carpal tunnel syndrome, bilateral upper limbs: G56.03

## 2018-01-10 NOTE — Procedures (Signed)
     HISTORY:  Jason Stokes is a 57 year old gentleman with a history of neck pain and pain radiating down the right arm to the hand.  He reports numbness in the right greater than left hand.  The patient is being evaluated for possible neuropathy or a cervical radiculopathy.  NERVE CONDUCTION STUDIES:  Nerve conduction studies were performed on both upper extremities.  The distal motor latencies for the median nerves were prolonged bilaterally with normal motor amplitudes for these nerves bilaterally.  The distal motor latencies for the ulnar nerves were normal bilaterally, with a low motor amplitude on the right, normal on the left.  The nerve conduction velocities for the median and ulnar nerves were normal bilaterally.  The sensory latencies for the median nerves were prolonged bilaterally, slightly prolonged for the right ulnar nerve, normal on the left.  The F-wave latencies for the ulnar nerves were normal bilaterally.  EMG STUDIES:  EMG study was performed on the right upper extremity:  The first dorsal interosseous muscle reveals 2 to 4 K units with slightly reduced recruitment. No fibrillations or positive waves were noted. The abductor pollicis brevis muscle reveals 2 to 4 K units with slightly reduced recruitment. No fibrillations or positive waves were noted. The extensor indicis proprius muscle reveals 1 to 3 K units with full recruitment. No fibrillations or positive waves were noted. The pronator teres muscle reveals 2 to 3 K units with full recruitment. No fibrillations or positive waves were noted. The biceps muscle reveals 1 to 2 K units with full recruitment. No fibrillations or positive waves were noted. The triceps muscle reveals 2 to 4 K units with full recruitment. No fibrillations or positive waves were noted. The anterior deltoid muscle reveals 2 to 3 K units with full recruitment. No fibrillations or positive waves were noted. The cervical paraspinal muscles were  tested at 2 levels. No abnormalities of insertional activity were seen at either level tested. There was good relaxation.   IMPRESSION:  Nerve conduction studies done on both upper extremities shows evidence of mild bilateral carpal tunnel syndrome with evidence of a mild right ulnar neuropathy, likely at the elbow.  The EMG evaluation of the right upper extremity was relatively unremarkable without evidence of an overlying cervical radiculopathy.  Jill Alexanders MD 01/10/2018 2:41 PM  [facility] [address] [place], [place] [address]  Phone [phone] Fax [phone]

## 2018-01-10 NOTE — Telephone Encounter (Signed)
Medicaid order sent to GI. They obtain the auth and will reach out to the pt to schedule.  °

## 2018-01-10 NOTE — Progress Notes (Addendum)
EMG nerve conduction study done today shows evidence of bilateral mild carpal tunnel syndrome, evidence of a mild right ulnar neuropathy as well.  EMG of the right arm does not show evidence of an overlying cervical radiculopathy.  The patient complains of neck pain rating down the right arm to the hand.  We will go ahead and recheck a cervical spine MRI.  The patient will be referred to a hand surgeon for the carpal tunnel syndrome.    [place]    Nerve / Sites Muscle Latency Ref. Amplitude Ref. Rel Amp Segments Distance Velocity Ref. Area    ms ms mV mV %  cm m/s m/s mVms  L Median - APB     Wrist APB 4.3 ?4.4 4.5 ?4.0 100 Wrist - APB 7   16.4     Upper arm APB 9.7  4.3  97.5 Upper arm - Wrist 27 50 ?49 16.9  R Median - APB     Wrist APB 4.6 ?4.4 4.3 ?4.0 100 Wrist - APB 7   13.4     Upper arm APB 9.7  4.4  101 Upper arm - Wrist 27 53 ?49 14.6  L Ulnar - ADM     Wrist ADM 3.0 ?3.3 8.9 ?6.0 100 Wrist - ADM 7   25.1     B.Elbow ADM 7.2  8.1  90.4 B.Elbow - Wrist 24 57 ?49 24.4     A.Elbow ADM 9.5  7.9  97.3 A.Elbow - B.Elbow 12 54 ?49 24.2         A.Elbow - Wrist      R Ulnar - ADM     Wrist ADM 3.0 ?3.3 5.7 ?6.0 100 Wrist - ADM 7   19.2     B.Elbow ADM 7.3  5.4  95.4 B.Elbow - Wrist 24 56 ?49 19.3     A.Elbow ADM 9.2  5.6  104 A.Elbow - B.Elbow 10 52 ?49 19.0         A.Elbow - Wrist                 SNC    Nerve / Sites Rec. Site Peak Lat Ref.  Amp Ref. Segments Distance    ms ms V V  cm  L Median - Orthodromic (Dig II, Mid palm)     Dig II Wrist 3.9 ?3.4 4 ?10 Dig II - Wrist 13  R Median - Orthodromic (Dig II, Mid palm)     Dig II Wrist 4.1 ?3.4 3 ?10 Dig II - Wrist 13  L Ulnar - Orthodromic, (Dig V, Mid palm)     Dig V Wrist 3.1 ?3.1 4 ?5 Dig V - Wrist 11  R Ulnar - Orthodromic, (Dig V, Mid palm)     Dig V Wrist 3.5 ?3.1 3 ?5 Dig V - Wrist 72             F  Wave    Nerve F Lat Ref.   ms ms  L Ulnar - ADM 29.1 ?32.0  R Ulnar - ADM 29.7 ?32.0

## 2018-01-10 NOTE — Progress Notes (Signed)
Please refer to EMG and nerve conduction study procedure note. 

## 2018-01-16 ENCOUNTER — Other Ambulatory Visit: Payer: Medicaid Other

## 2018-01-16 NOTE — Telephone Encounter (Signed)
Anderson Malta with [facility] called me and informed me Medicaid did not approve the MRI Cervical. The phone number for the peer to peer is [phone] and the case number is [id number].

## 2018-01-16 NOTE — Telephone Encounter (Signed)
I called the patient.  Medicare will not cover MRI of the cervical spine until we demonstrate 6 weeks of conservative management.  I will call the patient in 6 weeks regarding his symptoms.  I will cancel the order for the MRI of the cervical spine for now.

## 2018-01-17 ENCOUNTER — Ambulatory Visit: Payer: Medicaid Other | Admitting: Allergy and Immunology

## 2018-01-17 ENCOUNTER — Encounter: Payer: Self-pay | Admitting: Allergy and Immunology

## 2018-01-17 VITALS — BP 150/90 | HR 84 | Resp 22 | Ht 71.0 in | Wt 254.0 lb

## 2018-01-17 DIAGNOSIS — J3089 Other allergic rhinitis: Secondary | ICD-10-CM

## 2018-01-17 DIAGNOSIS — F17200 Nicotine dependence, unspecified, uncomplicated: Secondary | ICD-10-CM

## 2018-01-17 DIAGNOSIS — H1010 Acute atopic conjunctivitis, unspecified eye: Secondary | ICD-10-CM | POA: Diagnosis not present

## 2018-01-17 DIAGNOSIS — J4520 Mild intermittent asthma, uncomplicated: Secondary | ICD-10-CM

## 2018-01-17 MED ORDER — METHYLPREDNISOLONE ACETATE 80 MG/ML IJ SUSP
80.0000 mg | Freq: Once | INTRAMUSCULAR | Status: AC
  Administered 2018-01-17: 80 mg via INTRAMUSCULAR

## 2018-01-17 MED ORDER — AZELASTINE HCL 0.15 % NA SOLN: 1.0000 | Freq: Two times a day (BID) | NASAL | 5 refills | Status: DC

## 2018-01-17 MED ORDER — AZELASTINE HCL 0.05 % OP SOLN: 1.0000 [drp] | Freq: Two times a day (BID) | OPHTHALMIC | 5 refills | Status: DC

## 2018-01-17 NOTE — Patient Instructions (Addendum)
  1.  Allergen avoidance measures  2.  Use nicotine substitutes to replace tobacco smoke exposure  3.  Treat and prevent inflammation:   A.  Flonase 1 spray each nostril twice a day  B.  Azelastine nasal spray 1 spray each nostril twice a day  C.  Depo-Medrol 80 IM delivered in clinic today  4.  If needed:   A.  Cetirizine 10 mg 1 tablet 1-2 times a day  B.  Azelastine eyedrops 1 drop each eye twice a day  C.  Pro-air HFA 2 puffs every 4-6 hours  5. Obtain blood - Area 2 aeroallergen, [place] Pig IgE  6.  Start a course of immunotherapy  7.  Return to clinic in 8 weeks or earlier if problem  8.  Plan for fall flu vaccine

## 2018-01-17 NOTE — Progress Notes (Signed)
Dear Dr. Margarita Rana,  Thank you for referring Jason Stokes to the [facility] on 01/17/2018.   Below is a summation of this patient's evaluation and recommendations.  Thank you for your referral. I will keep you informed about this patient's response to treatment.   If you have any questions please do not hesitate to contact me.   Sincerely,  Jiles Prows, MD Allergy / [place]   ______________________________________________________________________    NEW PATIENT NOTE  Referring Provider: Charlott Rakes, MD Primary Provider: Charlott Rakes, MD Date of office visit: 01/17/2018    Subjective:   Chief Complaint:  Jason Stokes (DOB: 1961/06/04) is a 57 y.o. male who presents to the clinic on 01/17/2018 with a chief complaint of Allergic Rhinitis  .     HPI: [name] presents to this clinic in evaluation of allergies.  Sometime in his early 9s he developed issues with runny nose and sneezing and itchy watery eyes with lots of mucus production from his eyes and from his nose and from his throat along with some cough that occurs on a perennial basis with spring and fall exacerbation following exposure to pollen and dust.  He does not have any associated anosmia or recurrent ugly nasal discharge or fevers and headache.  He does not appear to have a significant amount of shortness of breath or chest tightness or wheezing.  He has been treated with Flonase and Singulair and multiple eyedrops and antihistamines and nothing appears to result in good control of his symptoms.  During the spring he is basically confined to the household and needs to use a mask whenever he goes outside of the household.  He is interested in a course of immunotherapy.  He has also been given an albuterol inhaler in the past and he sometimes uses this during the spring and fall for cough.  When his  allergies get particularly bad during the spring and fall he does have more cough.  Jason Stokes smokes tobacco averaging out to approximately 2 to 3 packs/month of cigarettes.  Past Medical History:  Diagnosis Date  . Anxiety   . Bilateral carpal tunnel syndrome 01/10/2018  . Bipolar disorder ([place])   . Chronic lower back pain   . Depression   . Dysrhythmia    a-fib  . GERD (gastroesophageal reflux disease)   . History of alcohol abuse   . History of nuclear stress test    Myoview 10/16: EF 50%, diaphragmatic attenuation, no ischemia, low risk  . Hypertension   . Migraine 2012-2014  . PAF (paroxysmal atrial fibrillation) ([place]) 03/27/2015   a. Myoview neg for ischemia >> Flecainide started 10/16 >> FU ETT   . Schizophrenia ([place])   . Sciatica neuralgia   . Small vessel disease ([place])    Right basal ganglia stroke  . Stroke [facility]) "between 2012-2014"   residual "AF" (09/22/2015)    Past Surgical History:  Procedure Laterality Date  . ATRIAL FIBRILLATION ABLATION  09/22/2015  . CYST EXCISION  1996-97   surgery back of head   . ELECTROPHYSIOLOGIC STUDY N/A 09/22/2015   Procedure: Atrial Fibrillation Ablation;  Surgeon: Will [name], MD;  Location: [place] [facility];  Service: Cardiovascular;  Laterality: N/A;  . ELECTROPHYSIOLOGIC STUDY N/A 12/10/2015   Procedure: Atrial Fibrillation Ablation;  Surgeon: Will [name], MD;  Location: [place] [facility];  Service: Cardiovascular;  Laterality: N/A;  .  ELECTROPHYSIOLOGIC STUDY N/A 12/11/2015   Procedure: Cardioversion;  Surgeon: Will [name], MD;  Location: [place] [facility];  Service: Cardiovascular;  Laterality: N/A;  . EXCISION MASS HEAD N/A 01/06/2017   Procedure: EXCISION MASS FOREHEAD;  Surgeon: Irene Limbo, MD;  Location: [facility];  Service: Plastics;  Laterality: N/A;  . GANGLION CYST EXCISION Left   . INTERCOSTAL NERVE BLOCK  2005  . KNEE ARTHROSCOPY Right 2016    Allergies as of 01/17/2018       Reactions   Lisinopril Anaphylaxis   Swelling of lips and tongue.      Medication List      albuterol 108 (90 Base) MCG/ACT inhaler Commonly known as:  PROVENTIL HFA;VENTOLIN HFA Inhale 2 puffs into the lungs every 6 (six) hours as needed for wheezing or shortness of breath.   benzonatate 100 MG capsule Commonly known as:  TESSALON Take 1 capsule (100 mg total) by mouth 2 (two) times daily as needed for cough.   carvedilol 12.5 MG tablet Commonly known as:  COREG Take 1 tablet (12.5 mg total) by mouth 2 (two) times daily.   cetirizine 10 MG tablet Commonly known as:  ZYRTEC Take 1 tablet (10 mg total) by mouth daily.   CYMBALTA 60 MG capsule Generic drug:  DULoxetine Take 60 mg by mouth daily.   ELIQUIS 5 MG Tabs tablet Generic drug:  apixaban TAKE 1 TABLET (5 MG TOTAL) BY MOUTH 2 (TWO) TIMES DAILY.   fluticasone 50 MCG/ACT nasal spray Commonly known as:  FLONASE Place 2 sprays into both nostrils daily.   gabapentin 100 MG capsule Commonly known as:  NEURONTIN TAKE 1 CAPSULE BY MOUTH THREE TIMES A DAY   hydrALAZINE 100 MG tablet Commonly known as:  APRESOLINE Take 1 tablet (100 mg total) by mouth 3 (three) times daily.   ketotifen 0.025 % ophthalmic solution Commonly known as:  ZADITOR Place 1 drop into both eyes 2 (two) times daily.   lidocaine 5 % Commonly known as:  LIDODERM Place 1 patch onto the skin daily. Remove & Discard patch within 12 hours or as directed by MD   olopatadine 0.1 % ophthalmic solution Commonly known as:  PATANOL INSTILL 1 DROP INTO BOTH EYES TWICE A DAY   omeprazole 20 MG capsule Commonly known as:  PRILOSEC TAKE 1 CAPSULE BY MOUTH EVERY DAY   oxyCODONE 5 MG immediate release tablet Commonly known as:  Oxy IR/ROXICODONE Take 5 mg by mouth every 6 (six) hours as needed for severe pain.   prazosin 1 MG capsule Commonly known as:  MINIPRESS TAKE 1 CAPSULE BY MOUTH EVERYDAY AT BEDTIME   traZODone 100 MG tablet Commonly known  as:  DESYREL Take 100-200 mg by mouth at bedtime as needed for sleep.   triamcinolone ointment 0.5 % Commonly known as:  KENALOG Apply 1 application topically 2 (two) times daily.   VOLTAREN 1 % Gel Generic drug:  diclofenac sodium Apply 4 g topically 4 (four) times daily.       Review of systems negative except as noted in HPI / PMHx or noted below:  Review of Systems  Constitutional: Negative.   HENT: Negative.   Eyes: Negative.   Respiratory: Negative.   Cardiovascular: Negative.   Gastrointestinal: Negative.   Genitourinary: Negative.   Musculoskeletal: Negative.   Skin: Negative.   Neurological: Negative.   Endo/Heme/Allergies: Negative.   Psychiatric/Behavioral: Negative.     Family History  Problem Relation Age of Onset  . Heart disease  Father   . Schizophrenia Sister     Social History   Socioeconomic History  . Marital status: Single    Spouse name: [name]  . Number of children: 3  . Years of education: HS  . Highest education level: Not on file  Occupational History    Comment: disabled  Social Needs  . Financial resource strain: Not on file  . Food insecurity:    Worry: Not on file    Inability: Not on file  . Transportation needs:    Medical: Not on file    Non-medical: Not on file  Tobacco Use  . Smoking status: Current Some Day Smoker    Packs/day: 0.25    Years: 29.00    Pack years: 7.25    Types: Cigarettes    Last attempt to quit: 08/19/2015    Years since quitting: 2.4  . Smokeless tobacco: Never Used  Substance and Sexual Activity  . Alcohol use: Yes    Alcohol/week: 0.0 oz    Comment: drinks social  . Drug use: Yes    Types: Marijuana    Comment: smoked marijuana 01-02-17 for pain  . Sexual activity: Not Currently  Lifestyle  . Physical activity:    Days per week: Not on file    Minutes per session: Not on file  . Stress: Not on file  Relationships  . Social connections:    Talks on phone: Not on file    Gets together:  Not on file    Attends religious service: Not on file    Active member of club or organization: Not on file    Attends meetings of clubs or organizations: Not on file    Relationship status: Not on file  . Intimate partner violence:    Fear of current or ex partner: Not on file    Emotionally abused: Not on file    Physically abused: Not on file    Forced sexual activity: Not on file  Other Topics Concern  . Not on file  Social History Narrative   Patient lives at home with [place].    Patient has 3 children 2 step.    Patient has 13 years of schooling.    Patient is right handed.     Environmental and Social history  Lives in a house with a dry environment, dogs and [place] pigs located inside the household, no carpet in the bedroom, no plastic on the bed, plastic on the pillow, actively smoking tobacco products.  Objective:   Vitals:   01/17/18 0919  BP: (!) 150/90  Pulse: 84  Resp: (!) 22  SpO2: 96%   Height: 5\' 11"  (180.3 cm) Weight: 254 lb (115.2 kg)  Physical Exam  Constitutional:  Allergic shiners  HENT:  Head: Normocephalic.  Right Ear: Tympanic membrane, external ear and ear canal normal.  Left Ear: Tympanic membrane, external ear and ear canal normal.  Nose: Mucosal edema present. No rhinorrhea.  Mouth/Throat: Uvula is midline, oropharynx is clear and moist and mucous membranes are normal. No oropharyngeal exudate.  Eyes: Conjunctivae are normal.  Neck: Trachea normal. No tracheal tenderness present. No tracheal deviation present. No thyromegaly present.  Cardiovascular: Normal rate, regular rhythm, S1 normal, S2 normal and normal heart sounds.  No murmur heard. Pulmonary/Chest: Breath sounds normal. No stridor. No respiratory distress. He has no wheezes. He has no rales.  Musculoskeletal: He exhibits no edema.  Lymphadenopathy:       Head (right side): No tonsillar adenopathy present.  Head (left side): No tonsillar adenopathy present.    He has no  cervical adenopathy.  Neurological: He is alert.  Skin: No rash noted. He is not diaphoretic. No erythema. Nails show no clubbing.    Diagnostics: Allergy skin tests were performed.  He demonstrated hypersensitivity to grass, tree, weed, dog, and dust mite.  Spirometry was performed and demonstrated an FEV1 of 2.80 @ 84 % of predicted. FEV1/FVC = 0.83  Results of blood tests obtained 03 January 2017 identified WBC 5.1, absolute eosinophil 200, absolute lymphocyte 1400, hemoglobin 13.3, platelet 176.  Results of the head CT scan performed 22 May 2014 made reference to the following:  Visualized paranasal sinuses, bilateral mastoid air cells and bilateral middle ear cavities well-aerated.  Results of a CT angiography head performed 22 September 2015 made reference to the following:  Paranasal sinuses: Mild to moderate nasal cavity and paranasal sinus and ethmoid sinus opacification. Stable small left maxillary mucous retention cyst. Mastoids are clear.  Results of the chest x-ray obtained 29 October 2014 identified the following:  Cardiac size is stable at the upper limits of normal. Other mediastinal contours are within normal limits. Lung volumes are within normal limits. No pneumothorax, pulmonary edema, pleural effusion or confluent pulmonary opacity. No acute osseous abnormality identified.   Assessment and Plan:    1. Other allergic rhinitis   2. Seasonal allergic conjunctivitis   3. Asthma, mild intermittent, well-controlled   4. Light tobacco smoker     1.  Allergen avoidance measures  2.  Use nicotine substitutes to replace tobacco smoke exposure  3.  Treat and prevent inflammation:   A.  Flonase 1 spray each nostril twice a day  B.  Azelastine nasal spray 1 spray each nostril twice a day  C.  Depo-Medrol 80 IM delivered in clinic today  4.  If needed:   A.  Cetirizine 10 mg 1 tablet 1-2 times a day  B.  Azelastine eyedrops 1 drop each eye twice a day  C.   Pro-air HFA 2 puffs every 4-6 hours  5. Obtain blood - Area 2 aeroallergen, [place] Pig IgE  6.  Start a course of immunotherapy  7.  Return to clinic in 8 weeks or earlier if problem  8.  Plan for fall flu vaccine  Jason Stokes has rather significant atopic disease affecting his upper airway and eyes and possibly his lower airway on occasion and he would definitely be a candidate for immunotherapy as he has failed medical therapy in the past and I have given him literature on this form of treatment and he is presently considering this option.  I have given him information about allergen avoidance measures and he will continue to use anti-inflammatory agents for his airway as noted above.  To be complete we will check an area to aero allergen profile and IgE antibody titers directed against [place] pig before starting a course of immunotherapy to help formulate a appropriate extract for this form of treatment.  I will see him back in this clinic in 8 weeks or earlier if there is a problem.  Jiles Prows, MD Allergy / Immunology [facility]

## 2018-01-21 LAB — ALLERGENS W/TOTAL IGE AREA 2
Alternaria Alternata IgE: <0.1 kU/L
Aspergillus Fumigatus IgE: 0.16 kU/L — AB
Bermuda Grass IgE: 0.2 kU/L — AB
Cat Dander IgE: <0.1 kU/L
Cedar, Mountain IgE: 3.82 kU/L — AB
Cladosporium Herbarum IgE: <0.1 kU/L
Cockroach, German IgE: <0.1 kU/L
Common Silver Birch IgE: 0.36 kU/L — AB
Cottonwood IgE: 0.86 kU/L — AB
D Farinae IgE: <0.1 kU/L
D Pteronyssinus IgE: <0.1 kU/L
Dog Dander IgE: <0.1 kU/L
Elm, American IgE: 1.08 kU/L — AB
IgE (Immunoglobulin E), Serum: 458 IU/mL (ref 6–495)
Johnson Grass IgE: 0.64 kU/L — AB
Maple/Box Elder IgE: 7.18 kU/L — AB
Mouse Urine IgE: <0.1 kU/L
Oak, White IgE: 0.64 kU/L — AB
Pecan, Hickory IgE: 2.24 kU/L — AB
Penicillium Chrysogen IgE: <0.1 kU/L
Pigweed, Rough IgE: 0.49 kU/L — AB
Ragweed, Short IgE: 0.53 kU/L — AB
Sheep Sorrel IgE Qn: 0.59 kU/L — AB
Timothy Grass IgE: 3.03 kU/L — AB
White Mulberry IgE: <0.1 kU/L

## 2018-01-21 LAB — ALLERGEN, GUINEA PIG EPITHELIUM, E6: Guinea Pig Epithelium: 1.43 kU/L — AB

## 2018-01-22 ENCOUNTER — Encounter: Payer: Self-pay | Admitting: Allergy and Immunology

## 2018-01-25 ENCOUNTER — Other Ambulatory Visit: Payer: Self-pay | Admitting: Allergy and Immunology

## 2018-01-25 DIAGNOSIS — J3089 Other allergic rhinitis: Secondary | ICD-10-CM

## 2018-01-25 NOTE — Progress Notes (Signed)
VIALS EXP 01-27-19

## 2018-01-26 DIAGNOSIS — J301 Allergic rhinitis due to pollen: Secondary | ICD-10-CM | POA: Diagnosis not present

## 2018-01-26 DIAGNOSIS — M19041 Primary osteoarthritis, right hand: Secondary | ICD-10-CM | POA: Insufficient documentation

## 2018-01-29 DIAGNOSIS — J3089 Other allergic rhinitis: Secondary | ICD-10-CM | POA: Diagnosis not present

## 2018-01-30 DIAGNOSIS — J301 Allergic rhinitis due to pollen: Secondary | ICD-10-CM | POA: Diagnosis not present

## 2018-01-31 ENCOUNTER — Encounter: Payer: Self-pay | Admitting: Family Medicine

## 2018-01-31 ENCOUNTER — Ambulatory Visit: Payer: Medicaid Other | Attending: Family Medicine | Admitting: Family Medicine

## 2018-01-31 VITALS — BP 130/75 | HR 88 | Temp 97.7°F | Ht 71.0 in | Wt 253.4 lb

## 2018-01-31 DIAGNOSIS — M5416 Radiculopathy, lumbar region: Secondary | ICD-10-CM

## 2018-01-31 DIAGNOSIS — I10 Essential (primary) hypertension: Secondary | ICD-10-CM | POA: Diagnosis present

## 2018-01-31 DIAGNOSIS — Z7901 Long term (current) use of anticoagulants: Secondary | ICD-10-CM | POA: Diagnosis not present

## 2018-01-31 DIAGNOSIS — G8929 Other chronic pain: Secondary | ICD-10-CM | POA: Diagnosis not present

## 2018-01-31 DIAGNOSIS — Z8673 Personal history of transient ischemic attack (TIA), and cerebral infarction without residual deficits: Secondary | ICD-10-CM | POA: Diagnosis not present

## 2018-01-31 DIAGNOSIS — F209 Schizophrenia, unspecified: Secondary | ICD-10-CM | POA: Insufficient documentation

## 2018-01-31 DIAGNOSIS — Z888 Allergy status to other drugs, medicaments and biological substances status: Secondary | ICD-10-CM | POA: Insufficient documentation

## 2018-01-31 DIAGNOSIS — M545 Low back pain: Secondary | ICD-10-CM | POA: Diagnosis not present

## 2018-01-31 DIAGNOSIS — J3089 Other allergic rhinitis: Secondary | ICD-10-CM | POA: Diagnosis not present

## 2018-01-31 DIAGNOSIS — Z79891 Long term (current) use of opiate analgesic: Secondary | ICD-10-CM | POA: Insufficient documentation

## 2018-01-31 DIAGNOSIS — I480 Paroxysmal atrial fibrillation: Secondary | ICD-10-CM | POA: Insufficient documentation

## 2018-01-31 DIAGNOSIS — F319 Bipolar disorder, unspecified: Secondary | ICD-10-CM | POA: Insufficient documentation

## 2018-01-31 DIAGNOSIS — M5116 Intervertebral disc disorders with radiculopathy, lumbar region: Secondary | ICD-10-CM | POA: Diagnosis not present

## 2018-01-31 DIAGNOSIS — Z9889 Other specified postprocedural states: Secondary | ICD-10-CM | POA: Diagnosis not present

## 2018-01-31 DIAGNOSIS — F419 Anxiety disorder, unspecified: Secondary | ICD-10-CM | POA: Insufficient documentation

## 2018-01-31 DIAGNOSIS — K219 Gastro-esophageal reflux disease without esophagitis: Secondary | ICD-10-CM | POA: Diagnosis not present

## 2018-01-31 DIAGNOSIS — M62838 Other muscle spasm: Secondary | ICD-10-CM | POA: Insufficient documentation

## 2018-01-31 DIAGNOSIS — F1290 Cannabis use, unspecified, uncomplicated: Secondary | ICD-10-CM | POA: Insufficient documentation

## 2018-01-31 DIAGNOSIS — Z13228 Encounter for screening for other metabolic disorders: Secondary | ICD-10-CM | POA: Diagnosis not present

## 2018-01-31 DIAGNOSIS — G5603 Carpal tunnel syndrome, bilateral upper limbs: Secondary | ICD-10-CM | POA: Diagnosis not present

## 2018-01-31 DIAGNOSIS — Z79899 Other long term (current) drug therapy: Secondary | ICD-10-CM | POA: Diagnosis not present

## 2018-01-31 DIAGNOSIS — F2089 Other schizophrenia: Secondary | ICD-10-CM

## 2018-01-31 MED ORDER — OMEPRAZOLE 20 MG PO CPDR: 20.0000 mg | DELAYED_RELEASE_CAPSULE | Freq: Every day | ORAL | 3 refills | Status: DC

## 2018-01-31 MED ORDER — TIZANIDINE HCL 4 MG PO TABS: 4.0000 mg | ORAL_TABLET | Freq: Three times a day (TID) | ORAL | 2 refills | Status: DC | PRN

## 2018-01-31 MED ORDER — ALBUTEROL SULFATE HFA 108 (90 BASE) MCG/ACT IN AERS: 2.0000 | INHALATION_SPRAY | Freq: Four times a day (QID) | RESPIRATORY_TRACT | 1 refills | Status: DC | PRN

## 2018-01-31 NOTE — Progress Notes (Signed)
Subjective:  Patient ID: Jason Stokes, male    DOB: 1960-10-09  Age: 57 y.o. MRN: [id number]  CC: Hypertension and Back Pain   HPI Jason Stokes is a 57 year old male with a history of hypertension, type 2 diabetes mellitus (A1c 5.7), bipolar disorder, paroxysmal A. 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 He is currently being followed by [facility] pain management where he receives Percocets but he informs me he still uses marijuana which helps with his low back pain. He is requesting a prescription for muscle relaxants as he started to cramp in his lower extremities and his hands and attributes this to recently receiving cortisone injections for bilateral carpal tunnel syndrome at [facility] 5 days ago. His bipolar disorder is managed at [facility]. Denies chest pains, shortness of breath and he has an upcoming appointment with [facility] cardiology for management of his A. fib next month. Scheduled with allergy and immunology where he will be commencing immunotherapy next week for management of his allergies.  Past Medical History:  Diagnosis Date  . Anxiety   . Bilateral carpal tunnel syndrome 01/10/2018  . Bipolar disorder ([place])   . Chronic lower back pain   . Depression   . Dysrhythmia    a-fib  . GERD (gastroesophageal reflux disease)   . History of alcohol abuse   . History of nuclear stress test    Myoview 10/16: EF 50%, diaphragmatic attenuation, no ischemia, low risk  . Hypertension   . Migraine 2012-2014  . PAF (paroxysmal atrial fibrillation) ([place]) 03/27/2015   a. Myoview neg for ischemia >> Flecainide started 10/16 >> FU ETT   . Schizophrenia ([place])   . Sciatica neuralgia   . Small vessel disease ([place])    Right basal ganglia stroke  . Stroke [facility]) "between 2012-2014"   residual "AF" (09/22/2015)    Past Surgical History:  Procedure Laterality Date  . ATRIAL FIBRILLATION ABLATION  09/22/2015  . CYST EXCISION   1996-97   surgery back of head   . ELECTROPHYSIOLOGIC STUDY N/A 09/22/2015   Procedure: Atrial Fibrillation Ablation;  Surgeon: Will [name], MD;  Location: [place] [facility];  Service: Cardiovascular;  Laterality: N/A;  . ELECTROPHYSIOLOGIC STUDY N/A 12/10/2015   Procedure: Atrial Fibrillation Ablation;  Surgeon: Will [name], MD;  Location: [place] [facility];  Service: Cardiovascular;  Laterality: N/A;  . ELECTROPHYSIOLOGIC STUDY N/A 12/11/2015   Procedure: Cardioversion;  Surgeon: Will [name], MD;  Location: [place] [facility];  Service: Cardiovascular;  Laterality: N/A;  . EXCISION MASS HEAD N/A 01/06/2017   Procedure: EXCISION MASS FOREHEAD;  Surgeon: Irene Limbo, MD;  Location: [facility];  Service: Plastics;  Laterality: N/A;  . GANGLION CYST EXCISION Left   . INTERCOSTAL NERVE BLOCK  2005  . KNEE ARTHROSCOPY Right 2016    Allergies  Allergen Reactions  . Lisinopril Anaphylaxis    Swelling of lips and tongue.     Outpatient Medications Prior to Visit  Medication Sig Dispense Refill  . azelastine (OPTIVAR) 0.05 % ophthalmic solution Place 1 drop into both eyes 2 (two) times daily. 6 mL 5  . Azelastine HCl 0.15 % SOLN Place 1 spray into both nostrils 2 (two) times daily. 30 mL 5  . benzonatate (TESSALON) 100 MG capsule Take 1 capsule (100 mg total) by mouth 2 (two) times daily as needed for cough. 20 capsule 0  . carvedilol (COREG) 12.5 MG tablet Take 1  tablet (12.5 mg total) by mouth 2 (two) times daily. 60 tablet 3  . cetirizine (ZYRTEC) 10 MG tablet Take 1 tablet (10 mg total) by mouth daily. (Patient taking differently: Take 10 mg by mouth daily as needed for allergies. ) 30 tablet 1  . CYMBALTA 60 MG capsule Take 60 mg by mouth daily.  1  . ELIQUIS 5 MG TABS tablet TAKE 1 TABLET (5 MG TOTAL) BY MOUTH 2 (TWO) TIMES DAILY. 60 tablet 5  . fluticasone (FLONASE) 50 MCG/ACT nasal spray Place 2 sprays into both nostrils daily. (Patient taking  differently: Place 2 sprays into both nostrils daily as needed for allergies. ) 16 g 1  . gabapentin (NEURONTIN) 100 MG capsule TAKE 1 CAPSULE BY MOUTH THREE TIMES A DAY  1  . hydrALAZINE (APRESOLINE) 100 MG tablet Take 1 tablet (100 mg total) by mouth 3 (three) times daily. (Patient not taking: Reported on 01/17/2018) 270 tablet 3  . ketotifen (ZADITOR) 0.025 % ophthalmic solution Place 1 drop into both eyes 2 (two) times daily. 10 mL 1  . lidocaine (LIDODERM) 5 % Place 1 patch onto the skin daily. Remove & Discard patch within 12 hours or as directed by MD 30 patch 2  . olopatadine (PATANOL) 0.1 % ophthalmic solution INSTILL 1 DROP INTO BOTH EYES TWICE A DAY 5 mL 5  . oxyCODONE (OXY IR/ROXICODONE) 5 MG immediate release tablet Take 5 mg by mouth every 6 (six) hours as needed for severe pain.   0  . prazosin (MINIPRESS) 1 MG capsule TAKE 1 CAPSULE BY MOUTH EVERYDAY AT BEDTIME  2  . traZODone (DESYREL) 100 MG tablet Take 100-200 mg by mouth at bedtime as needed for sleep.   2  . triamcinolone ointment (KENALOG) 0.5 % Apply 1 application topically 2 (two) times daily. 30 g 0  . VOLTAREN 1 % GEL Apply 4 g topically 4 (four) times daily. (Patient taking differently: Apply 4 g topically 2 (two) times daily as needed (knee pain). ) 100 g 2  . albuterol (PROVENTIL HFA;VENTOLIN HFA) 108 (90 Base) MCG/ACT inhaler Inhale 2 puffs into the lungs every 6 (six) hours as needed for wheezing or shortness of breath. 1 Inhaler 1  . omeprazole (PRILOSEC) 20 MG capsule TAKE 1 CAPSULE BY MOUTH EVERY DAY 90 capsule 0   No facility-administered medications prior to visit.     ROS Review of Systems  Constitutional: Negative for activity change and appetite change.  HENT: Negative for sinus pressure and sore throat.   Eyes: Negative for visual disturbance.  Respiratory: Negative for cough, chest tightness and shortness of breath.   Cardiovascular: Negative for chest pain and leg swelling.  Gastrointestinal: Negative  for abdominal distention, abdominal pain, constipation and diarrhea.  Endocrine: Negative.   Genitourinary: Negative for dysuria.  Musculoskeletal:       See hpi  Skin: Negative for rash.  Allergic/Immunologic: Negative.   Neurological: Negative for weakness, light-headedness and numbness.  Psychiatric/Behavioral: Negative for dysphoric mood and suicidal ideas.    Objective:  BP 130/75   Pulse 88   Temp 97.7 F (36.5 C) (Oral)   Ht 5' 11"  (1.803 m)   Wt 253 lb 6.4 oz (114.9 kg)   SpO2 98%   BMI 35.34 kg/m   BP/Weight 01/31/2018 01/17/2018 6/38/7564  Systolic BP 332 951 884  Diastolic BP 75 90 92  Wt. (Lbs) 253.4 254 248.5  BMI 35.34 35.43 32.79      Physical Exam  Constitutional: He is oriented  to person, place, and time. He appears well-developed and well-nourished.  Cardiovascular: Normal rate, normal heart sounds and intact distal pulses.  No murmur heard. Pulmonary/Chest: Effort normal and breath sounds normal. He has no wheezes. He has no rales. He exhibits no tenderness.  Abdominal: Soft. Bowel sounds are normal. He exhibits no distension and no mass. There is no tenderness.  Musculoskeletal:  TTP to palpation of lumbar spine; positive straight leg raise bilaterally  Neurological: He is alert and oriented to person, place, and time.  Skin: Skin is warm and dry.     Assessment & Plan:   1. Paroxysmal atrial fibrillation (HCC) Continue anticoagulation with Eliquis and rate control with Coreg Follow-up with EP cardiology   2. Lumbar radiculopathy Uncontrolled Currently followed by [facility] pain management He continues to use THC  3. Bilateral carpal tunnel syndrome Status post cortisone injections Use wrist brace as prescribed  4. Muscle spasm Placed on tizanidine  5. Screening for metabolic disorder - [id number] - Lipid panel  6. Other schizophrenia ([place]) Management as per mental health at [facility] ordered this encounter  Medications  .  tiZANidine (ZANAFLEX) 4 MG tablet    Sig: Take 1 tablet (4 mg total) by mouth every 8 (eight) hours as needed for muscle spasms.    Dispense:  90 tablet    Refill:  2  . omeprazole (PRILOSEC) 20 MG capsule    Sig: Take 1 capsule (20 mg total) by mouth daily.    Dispense:  30 capsule    Refill:  3  . albuterol (PROVENTIL HFA;VENTOLIN HFA) 108 (90 Base) MCG/ACT inhaler    Sig: Inhale 2 puffs into the lungs every 6 (six) hours as needed for wheezing or shortness of breath.    Dispense:  1 Inhaler    Refill:  1    Follow-up: Return in about 3 months (around 05/03/2018) for Follow-up of chronic medical conditions.   Charlott Rakes MD

## 2018-01-31 NOTE — Patient Instructions (Signed)
Muscle Cramps and Spasms Muscle cramps and spasms are when muscles tighten by themselves. They usually get better within minutes. Muscle cramps are painful. They are usually stronger and last longer than muscle spasms. Muscle spasms may or may not be painful. They can last a few seconds or much longer. Follow these instructions at home:  Drink enough fluid to keep your pee (urine) clear or pale yellow.  Massage, stretch, and relax the muscle.  If directed, apply heat to tight or tense muscles as often as told by your doctor. Use the heat source that your doctor recommends. ? Place a towel between your skin and the heat source. ? Leave the heat on for 20-30 minutes. ? Take off the heat if your skin turns bright red. This is especially important if you are unable to feel pain, heat, or cold. You may have a greater risk of getting burned.  If directed, put ice on the affected area. This may help if you are sore or have pain after a cramp or spasm. ? Put ice in a plastic bag. ? Place a towel between your skin and the bag. ? Leave the ice on for 20 minutes, 2-3 times a day.  Take over-the-counter and prescription medicines only as told by your doctor.  Pay attention to any changes in your symptoms. Contact a doctor if:  Your cramps or spasms get worse or happen more often.  Your cramps or spasms do not get better with time. This information is not intended to replace advice given to you by your health care provider. Make sure you discuss any questions you have with your health care provider. Document Released: 06/16/2008 Document Revised: 08/05/2015 Document Reviewed: 04/07/2015 Elsevier Interactive Patient Education  2018 Elsevier Inc.  

## 2018-02-01 ENCOUNTER — Telehealth: Payer: Self-pay

## 2018-02-01 ENCOUNTER — Other Ambulatory Visit: Payer: Self-pay | Admitting: Family Medicine

## 2018-02-01 DIAGNOSIS — J302 Other seasonal allergic rhinitis: Secondary | ICD-10-CM

## 2018-02-01 LAB — CMP14+EGFR
ALT: 19 IU/L (ref 0–44)
AST: 15 IU/L (ref 0–40)
Albumin/Globulin Ratio: 1.4 (ref 1.2–2.2)
Albumin: 4.1 g/dL (ref 3.5–5.5)
Alkaline Phosphatase: 82 IU/L (ref 39–117)
BUN/Creatinine Ratio: 10 (ref 9–20)
BUN: 11 mg/dL (ref 6–24)
Bilirubin Total: 0.3 mg/dL (ref 0.0–1.2)
CO2: 22 mmol/L (ref 20–29)
Calcium: 9.3 mg/dL (ref 8.7–10.2)
Chloride: 103 mmol/L (ref 96–106)
Creatinine, Ser: 1.13 mg/dL (ref 0.76–1.27)
GFR calc Af Amer: 84 mL/min/{1.73_m2} (ref >59)
GFR calc non Af Amer: 72 mL/min/{1.73_m2} (ref >59)
Globulin, Total: 3 g/dL (ref 1.5–4.5)
Glucose: 90 mg/dL (ref 65–99)
Potassium: 3.8 mmol/L (ref 3.5–5.2)
Sodium: 138 mmol/L (ref 134–144)
Total Protein: 7.1 g/dL (ref 6.0–8.5)

## 2018-02-01 LAB — LIPID PANEL
Chol/HDL Ratio: 4.6 ratio (ref 0.0–5.0)
Cholesterol, Total: 174 mg/dL (ref 100–199)
HDL: 38 mg/dL — ABNORMAL LOW (ref >39)
LDL Calculated: 106 mg/dL — ABNORMAL HIGH (ref 0–99)
Triglycerides: 151 mg/dL — ABNORMAL HIGH (ref 0–149)
VLDL Cholesterol Cal: 30 mg/dL (ref 5–40)

## 2018-02-01 MED ORDER — CARVEDILOL 12.5 MG PO TABS: 12.5000 mg | ORAL_TABLET | Freq: Two times a day (BID) | ORAL | 1 refills | Status: DC

## 2018-02-01 NOTE — Telephone Encounter (Signed)
Patient was called and informed of lab results.   Patient states that he needs his BP meds refilled but I don't know what meds he is currently taking can you follow up.

## 2018-02-07 ENCOUNTER — Encounter: Payer: Medicaid Other | Admitting: *Deleted

## 2018-02-07 NOTE — Progress Notes (Signed)
  This encounter was created in error - please disregard.patient was here to start allergy injection upon reviewing patient states he was on an antibiotic for sinus infection today was the last dose but still was not feeling well.No injection given rescheduled appointment.

## 2018-02-09 ENCOUNTER — Ambulatory Visit (INDEPENDENT_AMBULATORY_CARE_PROVIDER_SITE_OTHER): Payer: Medicaid Other

## 2018-02-09 DIAGNOSIS — J3089 Other allergic rhinitis: Secondary | ICD-10-CM

## 2018-02-09 MED ORDER — EPINEPHRINE 0.3 MG/0.3ML IJ SOAJ: 0.3000 mg | Freq: Once | INTRAMUSCULAR | 1 refills | Status: AC

## 2018-02-09 NOTE — Progress Notes (Signed)
Immunotherapy   Patient Details  Name: Jason Stokes MRN: [id number] Date of Birth: Dec 26, 1960  02/09/2018  [name] started injections for  DMITE-WEED            GR[place]-T[place]-DOG Following schedule: B  Frequency: 1-2 times weekly Epi-Pen:Epi-Pen Available  Consent signed and patient instructions given. Patient received injections in office. He had no local or systemic reactions.   Lonn Georgia I Augusta Mirkin 02/09/2018, 10:40 AM

## 2018-02-16 ENCOUNTER — Ambulatory Visit (INDEPENDENT_AMBULATORY_CARE_PROVIDER_SITE_OTHER): Payer: Medicaid Other

## 2018-02-16 DIAGNOSIS — J309 Allergic rhinitis, unspecified: Secondary | ICD-10-CM

## 2018-02-21 ENCOUNTER — Encounter: Payer: Self-pay | Admitting: Cardiology

## 2018-02-21 ENCOUNTER — Encounter (INDEPENDENT_AMBULATORY_CARE_PROVIDER_SITE_OTHER): Payer: Self-pay

## 2018-02-21 ENCOUNTER — Ambulatory Visit: Payer: Medicaid Other | Admitting: Cardiology

## 2018-02-21 VITALS — BP 114/80 | HR 73 | Ht 73.0 in | Wt 253.4 lb

## 2018-02-21 DIAGNOSIS — I480 Paroxysmal atrial fibrillation: Secondary | ICD-10-CM | POA: Diagnosis not present

## 2018-02-21 DIAGNOSIS — I10 Essential (primary) hypertension: Secondary | ICD-10-CM | POA: Diagnosis not present

## 2018-02-21 MED ORDER — DRONEDARONE HCL 400 MG PO TABS: 400.0000 mg | ORAL_TABLET | Freq: Two times a day (BID) | ORAL | 6 refills | Status: DC

## 2018-02-21 NOTE — Patient Instructions (Signed)
Medication Instructions:  Your physician has recommended you make the following change in your medication:  1. START Multaq 400 mg twice a day  * If you need a refill on your cardiac medications before your next appointment, please call your pharmacy.   Labwork: None ordered  Testing/Procedures: None ordered  Follow-Up: Your physician wants you to follow-up in: 6 months with Dr. Curt Bears.  You will receive a reminder letter in the mail two months in advance. If you don't receive a letter, please call our office to schedule the follow-up appointment.  *Please note that any paperwork needing to be filled out by the provider will need to be addressed at the front desk prior to seeing the provider. Please note that any FMLA, disability or other documents regarding health condition is subject to a $25.00 charge that must be received prior to completion of paperwork in the form of a money order or check.  Thank you for choosing [facility]!!   Trinidad Curet, RN [phone]  Any Other Special Instructions Will Be Listed Below (If Applicable).  Dronedarone tablets What is this medicine? DRONEDARONE (droe NE da rone) is an antiarrhythmic drug. It helps make your heart beat regularly. This medicine may be used for other purposes; ask your health care provider or pharmacist if you have questions. COMMON BRAND NAME(S): Multaq What should I tell my health care provider before I take this medicine? They need to know if you have any of these conditions: -heart failure -history of irregular heartbeat -liver disease -liver or lung problems with the past use of amiodarone -low levels of magnesium in the blood -low levels of potassium in the blood -other heart disease -an unusual or allergic reaction to dronedarone, other medicines, foods, dyes, or preservatives -pregnant or trying to get pregnant -breast-feeding How should I use this medicine? Take this medicine by mouth with a glass of  water. Follow the directions on the prescription label. Take one tablet with the morning meal and one tablet with the evening meal. Do not take your medicine more often than directed. Do not stop taking except on the advice of your doctor or health care professional. A special MedGuide will be given to you by the pharmacist with each prescription and refill. Be sure to read this information carefully each time. Talk to your pediatrician regarding the use of this medicine in children. Special care may be needed. Overdosage: If you think you have taken too much of this medicine contact a poison control center or emergency room at once. NOTE: This medicine is only for you. Do not share this medicine with others. What if I miss a dose? If you miss a dose, take it as soon as you can. If it is almost time for your next dose, take only that dose. Do not take double or extra doses. What may interact with this medicine? Do not take this medicine with any of the following medications: -arsenic trioxide -certain antibiotics like clarithromycin, erythromycin, pentamidine, telithromycin, troleandomycin -certain medicines for depression like tricyclic antidepressants -certain medicines for fungal infections like fluconazole, itraconazole, ketoconazole, posaconazole, voriconazole -certain medicines for irregular heart beat like amiodarone, disopyramide, dofetilide, flecainide, ibutilide, quinidine, propafenone, sotalol -certain medicines for malaria like chloroquine, halofantrine -cisapride -cyclosporine -droperidol -haloperidol -methadone -other medicines that prolong the QT interval (cause an abnormal heart rhythm) -pimozide -nefazodone -phenothiazines like chlorpromazine, mesoridazine, prochlorperazine, thioridazine -ritonavir -ziprasidone This medicine may also interact with the following medications: -certain medicines for blood pressure, heart disease, or irregular heart  beat like diltiazem,  metoprolol, propranolol, verapamil -certain medicines for cholesterol like atorvastatin, lovastatin, simvastatin -certain medicines for seizures like carbamazepine, phenobarbital, phenytoin -digoxin -grapefruit juice -rifampin -sirolimus -St. John's Wort -tacrolimus This list may not describe all possible interactions. Give your health care provider a list of all the medicines, herbs, non-prescription drugs, or dietary supplements you use. Also tell them if you smoke, drink alcohol, or use illegal drugs. Some items may interact with your medicine. What should I watch for while using this medicine? Your condition will be monitored closely when you first begin therapy. Often, this drug is first started in a hospital or other monitored health care setting. Once you are on maintenance therapy, visit your doctor or health care professional for regular checks on your progress. Because your condition and use of this medicine carry some risk, it is a good idea to carry an identification card, necklace or bracelet with details of your condition, medications, and doctor or health care professional. Dennis Bast may get drowsy or dizzy. Do not drive, use machinery, or do anything that needs mental alertness until you know how this medicine affects you. Do not stand or sit up quickly, especially if you are an older patient. This reduces the risk of dizzy or fainting spells. What side effects may I notice from receiving this medicine? Side effects that you should report to your doctor or health care professional as soon as possible: -allergic reactions like skin rash, itching or hives, swelling of the face, lips, or tongue -breathing problems -cough -dark urine -fast, irregular heartbeat -general ill feeling or flu-like symptoms -light-colored stools -loss of appetite, nausea -right upper belly pain -slow heartbeat -stomach pain -swelling of the legs or ankles -unusually weak or tired -weight gain -yellowing  of the eyes or skin Side effects that usually do not require medical attention (report to your doctor or health care professional if they continue or are bothersome): -nausea -vomiting -stomach pain This list may not describe all possible side effects. Call your doctor for medical advice about side effects. You may report side effects to FDA at 1-800-FDA-1088. Where should I keep my medicine? Keep out of the reach of children. Store at room temperature between 15 and 30 degrees C (59 and 86 degrees F). Throw away any unused medicine after the expiration date. NOTE: This sheet is a summary. It may not cover all possible information. If you have questions about this medicine, talk to your doctor, pharmacist, or health care provider.  2018 Elsevier/Gold Standard (2015-08-06 12:43:06)

## 2018-02-21 NOTE — Progress Notes (Signed)
Electrophysiology Office Note   Date:  02/21/2018   ID:  [name], DOB 1961-04-14, MRN [id number]  PCP:  Jason Rakes, MD  Cardiologist:  Jason Stokes Primary Electrophysiologist: Jason Lenoir [name], MD    No chief complaint on file.    History of Present Illness: Jason Stokes is a 57 y.o. male who presents today for electrophysiology evaluation.   He is a history of paroxysmal atrial fibrillation, hypertension and remote CVA. He presented to the emergency room with atrial fibrillation and RVR. He was started on Eliquis and diltiazem at that time. He did not follow-up with cardiology until 03/27/15 when he complained of palpitations. He had AF ablation performed on 09/22/15. Unfortunately during his procedure, it was noted that he had a clot in his left atrium and the procedure was therefore canceled. Had repeat ablation 12/10/15. He had a stress test performed after initiation of flecainide. He had ventricular ectopy with nonsustained VT.  Today, denies symptoms of chest pain, orthopnea, PND, lower extremity edema, claudication, dizziness, presyncope, syncope, bleeding, or neurologic sequela. The patient is tolerating medications without difficulties.  Overall he has done well.  Unfortunately he has had some further atrial fibrillation.  This is occurred rarely, which he thinks is due to the heat outside.  He took a trip to [place] and did well without further episodes of syncope.  His atrial fibrillation symptoms are palpitations and shortness of breath.   Past Medical History:  Diagnosis Date  . Anxiety   . Bilateral carpal tunnel syndrome 01/10/2018  . Bipolar disorder ([place])   . Chronic lower back pain   . Depression   . Dysrhythmia    a-fib  . GERD (gastroesophageal reflux disease)   . History of alcohol abuse   . History of nuclear stress test    Myoview 10/16: EF 50%, diaphragmatic attenuation, no ischemia, low risk  . Hypertension   . Migraine 2012-2014  . PAF  (paroxysmal atrial fibrillation) ([place]) 03/27/2015   a. Myoview neg for ischemia >> Flecainide started 10/16 >> FU ETT   . Schizophrenia ([place])   . Sciatica neuralgia   . Small vessel disease ([place])    Right basal ganglia stroke  . Stroke [facility]) "between 2012-2014"   residual "AF" (09/22/2015)   Past Surgical History:  Procedure Laterality Date  . ATRIAL FIBRILLATION ABLATION  09/22/2015  . CYST EXCISION  1996-97   surgery back of head   . ELECTROPHYSIOLOGIC STUDY N/A 09/22/2015   Procedure: Atrial Fibrillation Ablation;  Surgeon: Francia Verry [name], MD;  Location: [facility] CV LAB;  Service: Cardiovascular;  Laterality: N/A;  . ELECTROPHYSIOLOGIC STUDY N/A 12/10/2015   Procedure: Atrial Fibrillation Ablation;  Surgeon: Nikcole Eischeid [name], MD;  Location: [place] CV LAB;  Service: Cardiovascular;  Laterality: N/A;  . ELECTROPHYSIOLOGIC STUDY N/A 12/11/2015   Procedure: Cardioversion;  Surgeon: Han Lysne [name], MD;  Location: [place] CV LAB;  Service: Cardiovascular;  Laterality: N/A;  . EXCISION MASS HEAD N/A 01/06/2017   Procedure: EXCISION MASS FOREHEAD;  Surgeon: Irene Limbo, MD;  Location: [place];  Service: Plastics;  Laterality: N/A;  . GANGLION CYST EXCISION Left   . INTERCOSTAL NERVE BLOCK  2005  . KNEE ARTHROSCOPY Right 2016     Current Outpatient Medications  Medication Sig Dispense Refill  . albuterol (PROVENTIL HFA;VENTOLIN HFA) 108 (90 Base) MCG/ACT inhaler Inhale 2 puffs into the lungs every 6 (six) hours as needed for wheezing or shortness of breath. 1 Inhaler 1  .  azelastine (OPTIVAR) 0.05 % ophthalmic solution Place 1 drop into both eyes 2 (two) times daily. 6 mL 5  . Azelastine HCl 0.15 % SOLN Place 1 spray into both nostrils 2 (two) times daily. 30 mL 5  . carvedilol (COREG) 12.5 MG tablet Take 1 tablet (12.5 mg total) by mouth 2 (two) times daily. 60 tablet 1  . cetirizine (ZYRTEC) 10 MG tablet TAKE 1 TABLET BY MOUTH EVERY DAY 30 tablet 2    . CYMBALTA 60 MG capsule Take 60 mg by mouth daily.  1  . ELIQUIS 5 MG TABS tablet TAKE 1 TABLET (5 MG TOTAL) BY MOUTH 2 (TWO) TIMES DAILY. 60 tablet 5  . fluticasone (FLONASE) 50 MCG/ACT nasal spray Place 2 sprays into both nostrils daily. 16 g 1  . hydrALAZINE (APRESOLINE) 100 MG tablet Take 1 tablet (100 mg total) by mouth 3 (three) times daily. 270 tablet 3  . ketotifen (ZADITOR) 0.025 % ophthalmic solution Place 1 drop into both eyes 2 (two) times daily. 10 mL 1  . lidocaine (LIDODERM) 5 % Place 1 patch onto the skin daily. Remove & Discard patch within 12 hours or as directed by MD 30 patch 2  . olopatadine (PATANOL) 0.1 % ophthalmic solution INSTILL 1 DROP INTO BOTH EYES TWICE A DAY 5 mL 5  . omeprazole (PRILOSEC) 20 MG capsule Take 1 capsule (20 mg total) by mouth daily. 30 capsule 3  . oxyCODONE (OXY IR/ROXICODONE) 5 MG immediate release tablet Take 5 mg by mouth every 6 (six) hours as needed for severe pain.   0  . prazosin (MINIPRESS) 1 MG capsule TAKE 1 CAPSULE BY MOUTH EVERYDAY AT BEDTIME  2  . tiZANidine (ZANAFLEX) 4 MG tablet Take 1 tablet (4 mg total) by mouth every 8 (eight) hours as needed for muscle spasms. 90 tablet 2  . traZODone (DESYREL) 100 MG tablet Take 100-200 mg by mouth at bedtime as needed for sleep.   2  . triamcinolone ointment (KENALOG) 0.5 % Apply 1 application topically 2 (two) times daily. 30 g 0   No current facility-administered medications for this visit.     Allergies:   Lisinopril   Social History:  The patient  reports that he has been smoking cigarettes.  He has a 7.25 pack-year smoking history. He has never used smokeless tobacco. He reports that he drinks alcohol. He reports that he has current or past drug history. Drug: Marijuana.   Family History:  The patient's family history includes Heart disease in his father; Schizophrenia in his sister.   ROS:  Please see the history of present illness.   Otherwise, review of systems is positive for SOB,  CP.   All other systems are reviewed and negative.   PHYSICAL EXAM: VS:  BP 114/80   Pulse 73   Ht 6\' 1"  (1.854 m)   Wt 253 lb 6.4 oz (114.9 kg)   SpO2 (!) 89%   BMI 33.43 kg/m  , BMI Body mass index is 33.43 kg/m. GEN: Well nourished, well developed, in no acute distress  HEENT: normal  Neck: no JVD, carotid bruits, or masses Cardiac: RRR; no murmurs, rubs, or gallops,no edema  Respiratory:  clear to auscultation bilaterally, normal work of breathing GI: soft, nontender, nondistended, + BS MS: no deformity or atrophy  Skin: warm and dry Neuro:  Strength and sensation are intact Psych: euthymic mood, full affect  EKG:  EKG is ordered today. Personal review of the ekg ordered shows this rhythm, PACs,  rate 73  Recent Labs: 01/31/2018: ALT 19; BUN 11; Creatinine, Ser 1.13; Potassium 3.8; Sodium 138    Lipid Panel     Component Value Date/Time   CHOL 174 01/31/2018 1136   TRIG 151 (H) 01/31/2018 1136   HDL 38 (L) 01/31/2018 1136   CHOLHDL 4.6 01/31/2018 1136   CHOLHDL 5.4 10/17/2013 1042   VLDL 36 10/17/2013 1042   LDLCALC 106 (H) 01/31/2018 1136     Wt Readings from Last 3 Encounters:  02/21/18 253 lb 6.4 oz (114.9 kg)  01/31/18 253 lb 6.4 oz (114.9 kg)  01/17/18 254 lb (115.2 kg)     Other studies Reviewed: Additional studies/ records that were reviewed today include:  SPECT 05/15/15, TTE 03/31/15 Review of the above records today demonstrates:  SPECT  The left ventricular ejection fraction is mildly decreased (45-54%).  Nuclear stress EF: 50%.  There was no ST segment deviation noted during stress.  Defect 1: Moderate sized, mild in intensity, fixed defect in the mid and basal inferior and basal inferolateral wall consistent with diaphragmatic attenuation. No ischemia noted.  This is a low risk study.  TTE - Normal LV systolic function; grade 1 diastolic dysfunction; mildLAE.  ETT 01/26/17 - personally reviewed  Blood pressure demonstrated a normal  response to exercise.  There was no ST segment deviation noted during stress.  Negative study for exercise induced ischemia.  There were frequent PVCs, ventricular couplets and ventricular salvos.   ASSESSMENT AND PLAN:  1.  Paroxysmal atrial fibrillation: He on Eliquis and diltiazem.  He is had a few episodes of atrial fibrillation, though he thinks that it may be due to the hot weather.  Due to his episodes of atrial fibrillation we Tiffanny Lamarche start Stokes on Multitak today.  This patients CHA2DS2-VASc Score and unadjusted Ischemic Stroke Rate (% per year) is equal to 3.2 % stroke rate/year from a score of 3  Above score calculated as 1 point each if present [CHF, HTN, DM, Vascular=MI/PAD/Aortic Plaque, Age if 65-74, or Male] Above score calculated as 2 points each if present [Age > 75, or Stroke/TIA/TE]   2. Hypertension: Currently well controlled.  No changes.  3. Syncope: No further episodes noted.  Current medicines are reviewed at length with the patient today.   The patient does not have concerns regarding his medicines.  The following changes were made today: Start Multitak  Labs/ tests ordered today include:  Orders Placed This Encounter  Procedures  . EKG 12-Lead     Disposition:   FU with Loring Liskey 6 months.  Signed, Garlin Batdorf [name], MD  02/21/2018 11:10 AM     [facility] [address] [place] [address] [phone] (office) [phone] (fax)

## 2018-02-22 ENCOUNTER — Encounter

## 2018-02-22 ENCOUNTER — Institutional Professional Consult (permissible substitution): Payer: Medicaid Other | Admitting: Neurology

## 2018-02-23 ENCOUNTER — Ambulatory Visit (INDEPENDENT_AMBULATORY_CARE_PROVIDER_SITE_OTHER): Payer: Medicaid Other

## 2018-02-23 ENCOUNTER — Telehealth: Payer: Self-pay

## 2018-02-23 DIAGNOSIS — J309 Allergic rhinitis, unspecified: Secondary | ICD-10-CM | POA: Diagnosis not present

## 2018-02-23 NOTE — Telephone Encounter (Signed)
I have done a Multaq PA over the phone with Eta at [facility]. Per Eta this PA has been approvedPA# [id number]  I have notified the pts pharmacy.

## 2018-02-26 ENCOUNTER — Telehealth: Payer: Self-pay | Admitting: Neurology

## 2018-02-26 DIAGNOSIS — M542 Cervicalgia: Principal | ICD-10-CM

## 2018-02-26 DIAGNOSIS — G8929 Other chronic pain: Secondary | ICD-10-CM

## 2018-02-26 NOTE — Telephone Encounter (Signed)
I called the patient.  If the patient still having neck pain and pain down the arms, we will go on and get MRI evaluation of the cervical spine.  The study was ordered previously but was denied because we did not give the patient a 6-week conservative management trial.

## 2018-02-26 NOTE — Telephone Encounter (Signed)
Medicaid order sent to GI. They will obtain the auth and will reach out to the pt to schedule.  °

## 2018-02-26 NOTE — Telephone Encounter (Signed)
MRI of the cervical spine will be set up.

## 2018-02-26 NOTE — Telephone Encounter (Signed)
Pt returning Dr. Tobey Grim call stating he still is in a great amount of pain. Would like to move forward with scheduling an MRI

## 2018-02-26 NOTE — Addendum Note (Signed)
Addended by: Kathrynn Ducking on: 02/26/2018 10:35 AM   Modules accepted: Orders

## 2018-02-27 NOTE — Telephone Encounter (Signed)
Patient initially was seen on 08 January 2018.  He had EMG nerve conduction studies on 10 January 2018, found to have carpal tunnel syndrome, sent to a hand surgeon.  He has continued to have neck pain and discomfort since initially seen, he has been treated with Cymbalta, tizanidine, and oxycodone.  He continues to have discomfort, he has had greater than 6 weeks of conservative management, MRI of the cervical spine has been ordered.

## 2018-02-27 NOTE — Telephone Encounter (Signed)
Can you tell me the 6 week conservative management treatment the patient went through?

## 2018-02-27 NOTE — Telephone Encounter (Signed)
Noted thank you

## 2018-03-02 ENCOUNTER — Ambulatory Visit (INDEPENDENT_AMBULATORY_CARE_PROVIDER_SITE_OTHER): Payer: Medicaid Other

## 2018-03-02 ENCOUNTER — Ambulatory Visit
Admission: RE | Admit: 2018-03-02 | Discharge: 2018-03-02 | Disposition: A | Discharge status: Home / Self Care | Payer: Medicaid Other | Source: Ambulatory Visit | Attending: Neurology | Admitting: Neurology

## 2018-03-02 ENCOUNTER — Telehealth: Payer: Self-pay | Admitting: Neurology

## 2018-03-02 DIAGNOSIS — G8929 Other chronic pain: Secondary | ICD-10-CM

## 2018-03-02 DIAGNOSIS — J309 Allergic rhinitis, unspecified: Secondary | ICD-10-CM

## 2018-03-02 DIAGNOSIS — M542 Cervicalgia: Principal | ICD-10-CM

## 2018-03-02 NOTE — Telephone Encounter (Signed)
  I called the patient.  MRI shows some spondylitic changes at C3-4 level off to the left, no evidence of nerve impingement on the right side, the patient is complaining of right arm and hand discomfort.  EMG evaluation did not show a radiculopathy.  The patient is on anticoagulant therapy.  I have recommended physical therapy on the neck and shoulders, if he wishes to do this he is to contact our office.  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 C4-5 and C5-6 also shows similar but milder changes. Compared with previous MRI from 10/08/2014 these changes appear to have progressed

## 2018-03-06 NOTE — Telephone Encounter (Signed)
Pt returned call. Please call to advise

## 2018-03-06 NOTE — Telephone Encounter (Signed)
I called the patient.  The MRI of the cervical spine did not show evidence of nerve root impingement on the right, the patient is mainly complaining of right arm pain.  I have recommended physical therapy, the patient is amenable to this, in the past he has not tolerated gabapentin or Lyrica, he could potentially go up on the Cymbalta if needed.  We will get a revisit set up in about 3 to 4 months.

## 2018-03-06 NOTE — Telephone Encounter (Signed)
Called pt. Scheduled follow up for 06/06/18 at 12pm w/ Dr. Jannifer Franklin. Pt verbalized understanding.

## 2018-03-06 NOTE — Addendum Note (Signed)
Addended by: Kathrynn Ducking on: 03/06/2018 10:24 AM   Modules accepted: Orders

## 2018-03-09 ENCOUNTER — Ambulatory Visit (INDEPENDENT_AMBULATORY_CARE_PROVIDER_SITE_OTHER): Payer: Medicaid Other

## 2018-03-09 DIAGNOSIS — J309 Allergic rhinitis, unspecified: Secondary | ICD-10-CM

## 2018-03-16 ENCOUNTER — Ambulatory Visit (INDEPENDENT_AMBULATORY_CARE_PROVIDER_SITE_OTHER): Payer: Medicaid Other

## 2018-03-16 DIAGNOSIS — J309 Allergic rhinitis, unspecified: Secondary | ICD-10-CM | POA: Diagnosis not present

## 2018-03-23 ENCOUNTER — Ambulatory Visit (INDEPENDENT_AMBULATORY_CARE_PROVIDER_SITE_OTHER): Payer: Medicaid Other

## 2018-03-23 DIAGNOSIS — J309 Allergic rhinitis, unspecified: Secondary | ICD-10-CM

## 2018-03-30 ENCOUNTER — Ambulatory Visit (INDEPENDENT_AMBULATORY_CARE_PROVIDER_SITE_OTHER): Payer: Medicaid Other

## 2018-03-30 DIAGNOSIS — J309 Allergic rhinitis, unspecified: Secondary | ICD-10-CM | POA: Diagnosis not present

## 2018-04-03 ENCOUNTER — Encounter: Payer: Self-pay | Admitting: Allergy and Immunology

## 2018-04-03 ENCOUNTER — Ambulatory Visit: Payer: Medicaid Other | Admitting: Allergy and Immunology

## 2018-04-03 VITALS — BP 128/86 | HR 80 | Resp 20

## 2018-04-03 DIAGNOSIS — H1045 Other chronic allergic conjunctivitis: Secondary | ICD-10-CM

## 2018-04-03 DIAGNOSIS — H1013 Acute atopic conjunctivitis, bilateral: Secondary | ICD-10-CM

## 2018-04-03 DIAGNOSIS — J3089 Other allergic rhinitis: Secondary | ICD-10-CM | POA: Diagnosis not present

## 2018-04-03 DIAGNOSIS — J301 Allergic rhinitis due to pollen: Secondary | ICD-10-CM

## 2018-04-03 DIAGNOSIS — J4540 Moderate persistent asthma, uncomplicated: Secondary | ICD-10-CM

## 2018-04-03 DIAGNOSIS — F17200 Nicotine dependence, unspecified, uncomplicated: Secondary | ICD-10-CM

## 2018-04-03 NOTE — Patient Instructions (Addendum)
  1.  Perform Allergen avoidance measures. Move away from mold infested house  2.  Use nicotine substitutes to replace tobacco smoke exposure  3.  Treat and prevent inflammation:   A.  Flonase 1 spray each nostril twice a day  B.  Azelastine nasal spray 1 spray each nostril twice a day  C.  Montelukast 10 mg 1 tablet 1 time per day  D.  Breo 200 - 1 inhalation 1 time per day  E.  Lotemax - 1 drop each eye one time per day for 3 weeks  4.  If needed:   A.  Cetirizine 10 mg 1 tablet 1-2 times a day  B.  Azelastine eyedrops 1 drop each eye twice a day  C.  Pro-air HFA 2 puffs every 4-6 hours  5. Continue immunotherapy  6. Return to clinic in 3 weeks or earlier if problem  7.  Plan for fall flu vaccine

## 2018-04-03 NOTE — Progress Notes (Signed)
Follow-up Note  Referring Provider: Charlott Rakes, MD Primary Provider: Charlott Rakes, MD Date of Office Visit: 04/03/2018  Subjective:   Jason Stokes (DOB: 21-Apr-1961) is a 57 y.o. male who returns to the [facility] [place] on 04/03/2018 in re-evaluation of the following:  HPI: Jason Stokes returns to this clinic in reevaluation of his allergic rhinoconjunctivitis and component of mild intermittent asthma and tobacco use.  His last visit to this clinic was his initial evaluation of 17 January 2018.  He has started a course of immunotherapy which is going well without any adverse effect.  He still continues to have problems through this summer with intermittent nasal congestion and sneezing and itchy red watery eyes.  As well, he has developed some problems with shortness of breath and chest tightness and has been using a bronchodilator daily for the past month or so.  He believes that the trigger for this issue is mold exposure up in his attic.  Apparently he has addressed this issue with his landlord but nothing is being done to rectify this to any great extent.  He plans on moving out of the house at the end of October.  Of course, he is very allergic to the outdoors and whenever he goes outdoors he also appears to have a problem with his eyes and airway.  Allergies as of 04/03/2018      Reactions   Lisinopril Anaphylaxis   Swelling of lips and tongue.      Medication List      albuterol 108 (90 Base) MCG/ACT inhaler Commonly known as:  PROVENTIL HFA;VENTOLIN HFA Inhale 2 puffs into the lungs every 6 (six) hours as needed for wheezing or shortness of breath.   azelastine 0.05 % ophthalmic solution Commonly known as:  OPTIVAR Place 1 drop into both eyes 2 (two) times daily.   Azelastine HCl 0.15 % Soln Place 1 spray into both nostrils 2 (two) times daily.   carvedilol 12.5 MG tablet Commonly known as:  COREG Take 1 tablet (12.5 mg total) by mouth 2 (two) times daily.   cetirizine 10 MG tablet Commonly known as:  ZYRTEC TAKE 1 TABLET BY MOUTH EVERY DAY   CYMBALTA 60 MG capsule Generic drug:  DULoxetine Take 60 mg by mouth daily.   dronedarone 400 MG tablet Commonly known as:  MULTAQ Take 1 tablet (400 mg total) by mouth 2 (two) times daily with a meal.   ELIQUIS 5 MG Tabs tablet Generic drug:  apixaban TAKE 1 TABLET (5 MG TOTAL) BY MOUTH 2 (TWO) TIMES DAILY.   fluticasone 50 MCG/ACT nasal spray Commonly known as:  FLONASE Place 2 sprays into both nostrils daily.   hydrALAZINE 100 MG tablet Commonly known as:  APRESOLINE Take 1 tablet (100 mg total) by mouth 3 (three) times daily.   ketotifen 0.025 % ophthalmic solution Commonly known as:  ZADITOR Place 1 drop into both eyes 2 (two) times daily.   lidocaine 5 % Commonly known as:  LIDODERM Place 1 patch onto the skin daily. Remove & Discard patch within 12 hours or as directed by MD   olopatadine 0.1 % ophthalmic solution Commonly known as:  PATANOL INSTILL 1 DROP INTO BOTH EYES TWICE A DAY   omeprazole 20 MG capsule Commonly known as:  PRILOSEC Take 1 capsule (20 mg total) by mouth daily.   oxyCODONE 5 MG immediate release tablet Commonly known as:  Oxy IR/ROXICODONE Take 5 mg by mouth every 6 (six) hours as needed  for severe pain.   prazosin 1 MG capsule Commonly known as:  MINIPRESS TAKE 1 CAPSULE BY MOUTH EVERYDAY AT BEDTIME   tiZANidine 4 MG tablet Commonly known as:  ZANAFLEX Take 1 tablet (4 mg total) by mouth every 8 (eight) hours as needed for muscle spasms.   triamcinolone ointment 0.5 % Commonly known as:  KENALOG Apply 1 application topically 2 (two) times daily.       Past Medical History:  Diagnosis Date  . Anxiety   . Bilateral carpal tunnel syndrome 01/10/2018  . Bipolar disorder ([place])   . Chronic lower back pain   . Depression   . Dysrhythmia    a-fib  . GERD (gastroesophageal reflux disease)   . History of alcohol abuse   . History of nuclear  stress test    Myoview 10/16: EF 50%, diaphragmatic attenuation, no ischemia, low risk  . Hypertension   . Migraine 2012-2014  . PAF (paroxysmal atrial fibrillation) ([place]) 03/27/2015   a. Myoview neg for ischemia >> Flecainide started 10/16 >> FU ETT   . Schizophrenia ([place])   . Sciatica neuralgia   . Small vessel disease ([place])    Right basal ganglia stroke  . Stroke [facility]) "between 2012-2014"   residual "AF" (09/22/2015)    Past Surgical History:  Procedure Laterality Date  . ATRIAL FIBRILLATION ABLATION  09/22/2015  . CYST EXCISION  1996-97   surgery back of head   . ELECTROPHYSIOLOGIC STUDY N/A 09/22/2015   Procedure: Atrial Fibrillation Ablation;  Surgeon: Will [name], MD;  Location: [place] [facility];  Service: Cardiovascular;  Laterality: N/A;  . ELECTROPHYSIOLOGIC STUDY N/A 12/10/2015   Procedure: Atrial Fibrillation Ablation;  Surgeon: Will [name], MD;  Location: [place] [facility];  Service: Cardiovascular;  Laterality: N/A;  . ELECTROPHYSIOLOGIC STUDY N/A 12/11/2015   Procedure: Cardioversion;  Surgeon: Will [name], MD;  Location: [place] [facility];  Service: Cardiovascular;  Laterality: N/A;  . EXCISION MASS HEAD N/A 01/06/2017   Procedure: EXCISION MASS FOREHEAD;  Surgeon: Irene Limbo, MD;  Location: [place];  Service: Plastics;  Laterality: N/A;  . GANGLION CYST EXCISION Left   . INTERCOSTAL NERVE BLOCK  2005  . KNEE ARTHROSCOPY Right 2016    Review of systems negative except as noted in HPI / PMHx or noted below:  Review of Systems  Constitutional: Negative.   HENT: Negative.   Eyes: Negative.   Respiratory: Negative.   Cardiovascular: Negative.   Gastrointestinal: Negative.   Genitourinary: Negative.   Musculoskeletal: Negative.   Skin: Negative.   Neurological: Negative.   Endo/Heme/Allergies: Negative.   Psychiatric/Behavioral: Negative.      Objective:   Vitals:   04/03/18 1816  BP: 128/86  Pulse: 80  Resp:  20          Physical Exam  HENT:  Head: Normocephalic.  Right Ear: Tympanic membrane, external ear and ear canal normal.  Left Ear: Tympanic membrane, external ear and ear canal normal.  Nose: Mucosal edema present. No rhinorrhea.  Mouth/Throat: Uvula is midline, oropharynx is clear and moist and mucous membranes are normal. No oropharyngeal exudate.  Eyes: Right conjunctiva is injected. Left conjunctiva is injected.  Neck: Trachea normal. No tracheal tenderness present. No tracheal deviation present. No thyromegaly present.  Cardiovascular: Normal rate, regular rhythm, S1 normal, S2 normal and normal heart sounds.  No murmur heard. Pulmonary/Chest: Breath sounds normal. No stridor. No respiratory distress. He has no wheezes. He has no rales.  Musculoskeletal:  He exhibits no edema.  Lymphadenopathy:       Head (right side): No tonsillar adenopathy present.       Head (left side): No tonsillar adenopathy present.    He has no cervical adenopathy.  Neurological: He is alert.  Skin: No rash noted. He is not diaphoretic. No erythema. Nails show no clubbing.    Diagnostics:    Spirometry was performed and demonstrated an FEV1 of 3.05 at 92 % of predicted.  Results of blood tests obtained 17 January 2018 identified IgE 458 IU/mL, IgE antibodies directed against grass, trees, weeds, and Aspergillus and [place] pig.  Assessment and Plan:   1. Not well controlled moderate persistent asthma   2. Perennial allergic rhinitis   3. Seasonal allergic rhinitis due to pollen   4. Perennial allergic conjunctivitis of both eyes   5. Light tobacco smoker     1.  Perform Allergen avoidance measures. Move away from mold infested house  2.  Use nicotine substitutes to replace tobacco smoke exposure  3.  Treat and prevent inflammation:   A.  Flonase 1 spray each nostril twice a day  B.  Azelastine nasal spray 1 spray each nostril twice a day  C.  Montelukast 10 mg 1 tablet 1 time per day  D.   Breo 200 - 1 inhalation 1 time per day  E.  Lotemax - 1 drop each eye one time per day for 3 weeks  4.  If needed:   A.  Cetirizine 10 mg 1 tablet 1-2 times a day  B.  Azelastine eyedrops 1 drop each eye twice a day  C.  Pro-air HFA 2 puffs every 4-6 hours  5. Continue immunotherapy  6. Return to clinic in 3 weeks or earlier if problem  7.  Plan for fall flu vaccine  Eoin still has significant inflammation of his airway and his conjunctiva and I am going to have him utilize this plan of anti-inflammatory medications as noted above to address that issue while he also continues to use immunotherapy.  I will see him back in this clinic in 3 weeks or earlier if there is a problem.  Allena Katz, MD [facility] [place]

## 2018-04-04 ENCOUNTER — Encounter: Payer: Self-pay | Admitting: Allergy and Immunology

## 2018-04-04 NOTE — Addendum Note (Signed)
Addended by: Lucrezia Starch I on: 04/04/2018 07:35 AM   Modules accepted: Orders

## 2018-04-06 ENCOUNTER — Ambulatory Visit (INDEPENDENT_AMBULATORY_CARE_PROVIDER_SITE_OTHER): Payer: Medicaid Other

## 2018-04-06 DIAGNOSIS — J309 Allergic rhinitis, unspecified: Secondary | ICD-10-CM | POA: Diagnosis not present

## 2018-04-10 ENCOUNTER — Other Ambulatory Visit: Payer: Self-pay | Admitting: *Deleted

## 2018-04-10 ENCOUNTER — Telehealth: Payer: Self-pay | Admitting: Allergy and Immunology

## 2018-04-10 ENCOUNTER — Other Ambulatory Visit: Payer: Self-pay | Admitting: Allergy and Immunology

## 2018-04-10 MED ORDER — ALBUTEROL SULFATE HFA 108 (90 BASE) MCG/ACT IN AERS: 2.0000 | INHALATION_SPRAY | Freq: Four times a day (QID) | RESPIRATORY_TRACT | 1 refills | Status: DC | PRN

## 2018-04-10 MED ORDER — AZELASTINE HCL 0.05 % OP SOLN: 1.0000 [drp] | Freq: Two times a day (BID) | OPHTHALMIC | 5 refills | Status: DC

## 2018-04-10 MED ORDER — AZELASTINE HCL 0.15 % NA SOLN: 1.0000 | Freq: Two times a day (BID) | NASAL | 5 refills | Status: DC

## 2018-04-10 NOTE — Telephone Encounter (Signed)
Pt called and needs the rx called in from last week cetirizine, azelastine,proair. [facility]. [phone].

## 2018-04-10 NOTE — Telephone Encounter (Signed)
Prescription have been sent in. I called the patient and let him know.

## 2018-04-11 ENCOUNTER — Other Ambulatory Visit: Payer: Self-pay | Admitting: *Deleted

## 2018-04-11 MED ORDER — ALBUTEROL SULFATE HFA 108 (90 BASE) MCG/ACT IN AERS: 2.0000 | INHALATION_SPRAY | RESPIRATORY_TRACT | 1 refills | Status: DC | PRN

## 2018-04-12 ENCOUNTER — Telehealth: Payer: Self-pay

## 2018-04-12 NOTE — Telephone Encounter (Signed)
Received fax for PA for Azelastine HCL 0.15%. PA has been completed, approved and faxed back to pharmacy.

## 2018-04-13 ENCOUNTER — Ambulatory Visit (INDEPENDENT_AMBULATORY_CARE_PROVIDER_SITE_OTHER): Payer: Medicaid Other

## 2018-04-13 DIAGNOSIS — J309 Allergic rhinitis, unspecified: Secondary | ICD-10-CM

## 2018-04-15 ENCOUNTER — Other Ambulatory Visit: Payer: Self-pay | Admitting: Cardiology

## 2018-04-16 ENCOUNTER — Encounter: Payer: Self-pay | Admitting: *Deleted

## 2018-04-16 NOTE — Progress Notes (Signed)
This encounter was created in error - please disregard.

## 2018-04-19 ENCOUNTER — Ambulatory Visit: Payer: Medicaid Other

## 2018-04-19 ENCOUNTER — Ambulatory Visit: Payer: Medicaid Other | Attending: Family Medicine | Admitting: Physician Assistant

## 2018-04-19 ENCOUNTER — Other Ambulatory Visit: Payer: Self-pay

## 2018-04-19 VITALS — BP 131/82 | HR 109 | Temp 98.4°F | Resp 18 | Ht 73.0 in | Wt 245.0 lb

## 2018-04-19 DIAGNOSIS — R062 Wheezing: Secondary | ICD-10-CM | POA: Diagnosis not present

## 2018-04-19 DIAGNOSIS — Z888 Allergy status to other drugs, medicaments and biological substances status: Secondary | ICD-10-CM | POA: Insufficient documentation

## 2018-04-19 DIAGNOSIS — Z8673 Personal history of transient ischemic attack (TIA), and cerebral infarction without residual deficits: Secondary | ICD-10-CM | POA: Diagnosis not present

## 2018-04-19 DIAGNOSIS — J069 Acute upper respiratory infection, unspecified: Secondary | ICD-10-CM

## 2018-04-19 DIAGNOSIS — I480 Paroxysmal atrial fibrillation: Secondary | ICD-10-CM | POA: Insufficient documentation

## 2018-04-19 DIAGNOSIS — G5603 Carpal tunnel syndrome, bilateral upper limbs: Secondary | ICD-10-CM | POA: Insufficient documentation

## 2018-04-19 DIAGNOSIS — J324 Chronic pansinusitis: Secondary | ICD-10-CM

## 2018-04-19 DIAGNOSIS — F209 Schizophrenia, unspecified: Secondary | ICD-10-CM | POA: Diagnosis not present

## 2018-04-19 DIAGNOSIS — K219 Gastro-esophageal reflux disease without esophagitis: Secondary | ICD-10-CM | POA: Insufficient documentation

## 2018-04-19 DIAGNOSIS — Z7901 Long term (current) use of anticoagulants: Secondary | ICD-10-CM | POA: Insufficient documentation

## 2018-04-19 DIAGNOSIS — I10 Essential (primary) hypertension: Secondary | ICD-10-CM | POA: Diagnosis not present

## 2018-04-19 DIAGNOSIS — Z79899 Other long term (current) drug therapy: Secondary | ICD-10-CM | POA: Diagnosis not present

## 2018-04-19 DIAGNOSIS — F319 Bipolar disorder, unspecified: Secondary | ICD-10-CM | POA: Insufficient documentation

## 2018-04-19 MED ORDER — AZITHROMYCIN 250 MG PO TABS: ORAL_TABLET | ORAL | 0 refills | Status: DC

## 2018-04-19 MED ORDER — METHYLPREDNISOLONE SODIUM SUCC 125 MG IJ SOLR
125.0000 mg | Freq: Once | INTRAMUSCULAR | Status: AC
  Administered 2018-04-19: 125 mg via INTRAMUSCULAR

## 2018-04-19 NOTE — Progress Notes (Signed)
Patient ID: Jason Stokes, male   DOB: [birth year], 57 y.o.   MRN: [id number]     Jason Stokes, is a 57 y.o. male  [id number]  [id number]  DOB - 1960-11-28  Subjective:  Chief Complaint and HPI: Jason Stokes is a 57 y.o. male here today with >1 week h/o sinus congestion and sinus pain.  Ears feel full/can't hear well. No fever.  Seems to be wheezing more over the last week.  Went to pulmonologist about 1 week ago.  No changes to regimen.  +some cough.  Mucus thick and sometimes yellow.  Tolerates steroid and hasn't had to take any in a long time.     ROS:   Constitutional:  No f/c, No night sweats, No unexplained weight loss. EENT:  No vision changes, No blurry vision, No hearing changes. +sinus and ear congestion Respiratory: + cough, No SOB, some wheezing Cardiac: No CP, no palpitations GI:  No abd pain, No N/V/D. GU: No Urinary s/sx Musculoskeletal: No joint pain Neuro: No headache, no dizziness, no motor weakness.  Skin: No rash Endocrine:  No polydipsia. No polyuria.  Psych: Denies SI/HI  No problems updated.  ALLERGIES: Allergies  Allergen Reactions  . Lisinopril Anaphylaxis    Swelling of lips and tongue.    PAST MEDICAL HISTORY: Past Medical History:  Diagnosis Date  . Anxiety   . Bilateral carpal tunnel syndrome 01/10/2018  . Bipolar disorder ([place])   . Chronic lower back pain   . Depression   . Dysrhythmia    a-fib  . GERD (gastroesophageal reflux disease)   . History of alcohol abuse   . History of nuclear stress test    Myoview 10/16: EF 50%, diaphragmatic attenuation, no ischemia, low risk  . Hypertension   . Migraine 2012-2014  . PAF (paroxysmal atrial fibrillation) ([place]) 03/27/2015   a. Myoview neg for ischemia >> Flecainide started 10/16 >> FU ETT   . Schizophrenia ([place])   . Sciatica neuralgia   . Small vessel disease ([place])    Right basal ganglia stroke  . Stroke [facility]) "between 2012-2014"   residual "AF" (09/22/2015)    MEDICATIONS AT  HOME: Prior to Admission medications   Medication Sig Start Date End Date Taking? Authorizing Provider  albuterol (PROAIR HFA) 108 (90 Base) MCG/ACT inhaler Inhale 2 puffs into the lungs every 4 (four) hours as needed for wheezing or shortness of breath. 04/11/18  Yes Kozlow, Donnamarie Poag, MD  azelastine (OPTIVAR) 0.05 % ophthalmic solution Place 1 drop into both eyes 2 (two) times daily. 04/10/18  Yes Kozlow, Donnamarie Poag, MD  Azelastine HCl 0.15 % SOLN Place 1 spray into both nostrils 2 (two) times daily. 04/10/18  Yes Kozlow, Donnamarie Poag, MD  carvedilol (COREG) 12.5 MG tablet Take 1 tablet (12.5 mg total) by mouth 2 (two) times daily. 02/01/18  Yes Newlin, Charlane Ferretti, MD  cetirizine (ZYRTEC) 10 MG tablet TAKE 1 TABLET BY MOUTH EVERY DAY 02/01/18  Yes Newlin, Enobong, MD  CYMBALTA 60 MG capsule Take 60 mg by mouth daily. 04/29/14  Yes [provider]  dronedarone (MULTAQ) 400 MG tablet Take 1 tablet (400 mg total) by mouth 2 (two) times daily with a meal. 02/21/18  Yes Camnitz, Will Hassell Done, MD  ELIQUIS 5 MG TABS tablet TAKE 1 TABLET (5 MG TOTAL) BY MOUTH 2 (TWO) TIMES DAILY. 08/23/17  Yes Camnitz, Will Hassell Done, MD  fluticasone (FLONASE) 50 MCG/ACT nasal spray Place 2 sprays into both nostrils daily. 10/31/17  Yes Charlott Rakes, MD  hydrALAZINE (APRESOLINE) 100 MG tablet Take 1 tablet (100 mg total) by mouth 3 (three) times daily. 04/16/18  Yes Camnitz, Will Hassell Done, MD  ketotifen (ZADITOR) 0.025 % ophthalmic solution Place 1 drop into both eyes 2 (two) times daily. 09/22/16  Yes Funches, Josalyn, MD  lidocaine (LIDODERM) 5 % Place 1 patch onto the skin daily. Remove & Discard patch within 12 hours or as directed by MD 10/31/17  Yes Charlott Rakes, MD  olopatadine (PATANOL) 0.1 % ophthalmic solution INSTILL 1 DROP INTO BOTH EYES TWICE A DAY 11/27/17  Yes Newlin, Enobong, MD  omeprazole (PRILOSEC) 20 MG capsule Take 1 capsule (20 mg total) by mouth daily. 01/31/18  Yes Charlott Rakes, MD  oxyCODONE (OXY IR/ROXICODONE) 5 MG  immediate release tablet Take 5 mg by mouth every 6 (six) hours as needed for severe pain.  12/01/17  Yes [provider]  prazosin (MINIPRESS) 1 MG capsule TAKE 1 CAPSULE BY MOUTH EVERYDAY AT BEDTIME 09/04/17  Yes [provider]  tiZANidine (ZANAFLEX) 4 MG tablet Take 1 tablet (4 mg total) by mouth every 8 (eight) hours as needed for muscle spasms. 01/31/18  Yes Charlott Rakes, MD  triamcinolone ointment (KENALOG) 0.5 % Apply 1 application topically 2 (two) times daily. 07/10/15  Yes Funches, Adriana Mccallum, MD  azithromycin (ZITHROMAX) 250 MG tablet Take 2 today then 1 daily 04/19/18   Argentina Donovan, PA-C     Objective:  EXAM:   Vitals:   04/19/18 1527  BP: 131/82  Pulse: (!) 109  Resp: 18  Temp: 98.4 F (36.9 C)  TempSrc: Oral  SpO2: 96%  Weight: 245 lb (111.1 kg)  Height: 6\' 1"  (1.854 m)    General appearance : A&OX3. NAD. Non-toxic-appearing HEENT: Atraumatic and Normocephalic.  PERRLA. EOM intact.  TM full B.  + turbinates very swollen.  Maxillary sinuses TTP Mouth-MMM, post pharynx WNL w/o erythema, + PND. Neck: supple, no JVD. No cervical lymphadenopathy. No thyromegaly Chest/Lungs:  Breathing-non-labored, Good air entry bilaterally, breath sounds fair without rales or rhonchi.  There is moderate wheezing L lung   CVS: S1 S2 regular, no murmurs, gallops, rubs.  rate down to 92 on exam Extremities: Bilateral Lower Ext shows no edema, both legs are warm to touch with = pulse throughout Neurology:  CN II-XII grossly intact, Non focal.   Psych:  TP linear. J/I WNL. Normal speech. Appropriate eye contact and affect.  Skin:  No Rash  Data Review Lab Results  Component Value Date   HGBA1C 5.7 04/03/2017   HGBA1C 5.60 07/10/2015     Assessment & Plan   1. Pansinusitis, unspecified chronicity - methylPREDNISolone sodium succinate (SOLU-MEDROL) 125 mg/2 mL injection 125 mg - azithromycin (ZITHROMAX) 250 MG tablet; Take 2 today then 1 daily  Dispense: 6  tablet; Refill: 0  2. Wheezing - methylPREDNISolone sodium succinate (SOLU-MEDROL) 125 mg/2 mL injection 125 mg  3. Upper respiratory tract infection, unspecified type - azithromycin (ZITHROMAX) 250 MG tablet; Take 2 today then 1 daily  Dispense: 6 tablet; Refill: 0  Fluids, rest, respiratory care  Patient have been counseled extensively about nutrition and exercise  Return for keep 05/07/2018 appt.  The patient was given clear instructions to go to ER or return to medical center if symptoms don't improve, worsen or new problems develop. The patient verbalized understanding. The patient was told to call to get lab results if they haven't heard anything in the next week.     Freeman Caldron, PA-C [facility] and  [place], [place]   04/19/2018, 3:42 PM

## 2018-04-19 NOTE — Progress Notes (Signed)
Flu shot: yes Sinus giving migranes and been going on for about 1 wk Pain: shoulder neck lower back. 10, 50mnth

## 2018-04-19 NOTE — Patient Instructions (Signed)

## 2018-04-20 ENCOUNTER — Ambulatory Visit: Payer: Self-pay

## 2018-04-20 DIAGNOSIS — J309 Allergic rhinitis, unspecified: Secondary | ICD-10-CM

## 2018-04-27 ENCOUNTER — Ambulatory Visit (INDEPENDENT_AMBULATORY_CARE_PROVIDER_SITE_OTHER): Payer: Medicaid Other

## 2018-04-27 DIAGNOSIS — J309 Allergic rhinitis, unspecified: Secondary | ICD-10-CM | POA: Diagnosis not present

## 2018-05-02 ENCOUNTER — Ambulatory Visit: Payer: Medicaid Other | Attending: Critical Care Medicine | Admitting: Critical Care Medicine

## 2018-05-02 ENCOUNTER — Encounter: Payer: Self-pay | Admitting: Critical Care Medicine

## 2018-05-02 VITALS — BP 125/88 | HR 87 | Temp 98.3°F | Resp 18 | Ht 73.0 in | Wt 243.0 lb

## 2018-05-02 DIAGNOSIS — Z8673 Personal history of transient ischemic attack (TIA), and cerebral infarction without residual deficits: Secondary | ICD-10-CM | POA: Diagnosis not present

## 2018-05-02 DIAGNOSIS — I10 Essential (primary) hypertension: Secondary | ICD-10-CM | POA: Insufficient documentation

## 2018-05-02 DIAGNOSIS — Z7901 Long term (current) use of anticoagulants: Secondary | ICD-10-CM | POA: Insufficient documentation

## 2018-05-02 DIAGNOSIS — Z77120 Contact with and (suspected) exposure to mold (toxic): Secondary | ICD-10-CM | POA: Insufficient documentation

## 2018-05-02 DIAGNOSIS — Z792 Long term (current) use of antibiotics: Secondary | ICD-10-CM | POA: Insufficient documentation

## 2018-05-02 DIAGNOSIS — Z79899 Other long term (current) drug therapy: Secondary | ICD-10-CM | POA: Diagnosis not present

## 2018-05-02 DIAGNOSIS — J321 Chronic frontal sinusitis: Secondary | ICD-10-CM | POA: Diagnosis present

## 2018-05-02 DIAGNOSIS — F319 Bipolar disorder, unspecified: Secondary | ICD-10-CM | POA: Diagnosis not present

## 2018-05-02 DIAGNOSIS — J3089 Other allergic rhinitis: Secondary | ICD-10-CM | POA: Insufficient documentation

## 2018-05-02 DIAGNOSIS — F17210 Nicotine dependence, cigarettes, uncomplicated: Secondary | ICD-10-CM | POA: Diagnosis not present

## 2018-05-02 DIAGNOSIS — K219 Gastro-esophageal reflux disease without esophagitis: Secondary | ICD-10-CM | POA: Insufficient documentation

## 2018-05-02 DIAGNOSIS — F209 Schizophrenia, unspecified: Secondary | ICD-10-CM | POA: Diagnosis not present

## 2018-05-02 DIAGNOSIS — Z888 Allergy status to other drugs, medicaments and biological substances status: Secondary | ICD-10-CM | POA: Insufficient documentation

## 2018-05-02 DIAGNOSIS — Z8249 Family history of ischemic heart disease and other diseases of the circulatory system: Secondary | ICD-10-CM | POA: Insufficient documentation

## 2018-05-02 DIAGNOSIS — Z79891 Long term (current) use of opiate analgesic: Secondary | ICD-10-CM | POA: Diagnosis not present

## 2018-05-02 DIAGNOSIS — F419 Anxiety disorder, unspecified: Secondary | ICD-10-CM | POA: Diagnosis not present

## 2018-05-02 DIAGNOSIS — Z818 Family history of other mental and behavioral disorders: Secondary | ICD-10-CM | POA: Diagnosis not present

## 2018-05-02 DIAGNOSIS — Z7952 Long term (current) use of systemic steroids: Secondary | ICD-10-CM | POA: Insufficient documentation

## 2018-05-02 DIAGNOSIS — Z7951 Long term (current) use of inhaled steroids: Secondary | ICD-10-CM | POA: Diagnosis not present

## 2018-05-02 DIAGNOSIS — Z720 Tobacco use: Secondary | ICD-10-CM | POA: Diagnosis not present

## 2018-05-02 DIAGNOSIS — J4520 Mild intermittent asthma, uncomplicated: Secondary | ICD-10-CM | POA: Insufficient documentation

## 2018-05-02 DIAGNOSIS — J4541 Moderate persistent asthma with (acute) exacerbation: Secondary | ICD-10-CM | POA: Diagnosis not present

## 2018-05-02 HISTORY — DX: Moderate persistent asthma with (acute) exacerbation: J45.41

## 2018-05-02 MED ORDER — PREDNISONE 10 MG PO TABS: 40.0000 mg | ORAL_TABLET | Freq: Every day | ORAL | 0 refills | Status: DC

## 2018-05-02 MED ORDER — FLUTICASONE PROPIONATE 50 MCG/ACT NA SUSP: NASAL | 1 refills | Status: DC

## 2018-05-02 MED ORDER — FLUTICASONE PROPIONATE HFA 220 MCG/ACT IN AERO: 2.0000 | INHALATION_SPRAY | Freq: Two times a day (BID) | RESPIRATORY_TRACT | 12 refills | Status: DC

## 2018-05-02 NOTE — Assessment & Plan Note (Signed)
Moderate persistent asthma with acute flare exacerbated by ongoing mold exposure in the home Plan Prednisone 40 mg daily x5 days will be prescribed No additional antibiotics indicated Begin Flovent HFA 220 mcg strength 2 puffs twice daily Increase fluticasone nasal spray to 2 puffs twice daily for 5 days then reduce to 2 puffs daily thereafter A chest x-ray will be obtained

## 2018-05-02 NOTE — Patient Instructions (Addendum)
Start Flovent HFA 2 puffs twice daily Increase fluticasone nasal spray to 2 puffs twice daily each nostril for 5 days then reduce back to 2 puffs daily each nostril Begin prednisone 10 mg tablet take 4 of these daily for 5 days then stop Use albuterol 2 puffs every 6 hours as needed only for shortness of breath Focus on smoking cessation and obtain nicotine lozenge 2 mg and use 3 times daily to reduce cigarette use A flu shot will be given today A chest x-ray will be obtained  return to clinic 1 month   Asthma, Adult Asthma is a condition of the lungs in which the airways tighten and narrow. Asthma can make it hard to breathe. Asthma cannot be cured, but medicine and lifestyle changes can help control it. Asthma may be started (triggered) by:  Animal skin flakes (dander).  Dust.  Cockroaches.  Pollen.  Mold.  Smoke.  Cleaning products.  Hair sprays or aerosol sprays.  Paint fumes or strong smells.  Cold air, weather changes, and winds.  Crying or laughing hard.  Stress.  Certain medicines or drugs.  Foods, such as dried fruit, potato chips, and sparkling grape juice.  Infections or conditions (colds, flu).  Exercise.  Certain medical conditions or diseases.  Exercise or tiring activities.  Follow these instructions at home:  Take medicine as told by your doctor.  Use a peak flow meter as told by your doctor. A peak flow meter is a tool that measures how well the lungs are working.  Record and keep track of the peak flow meter's readings.  Understand and use the asthma action plan. An asthma action plan is a written plan for taking care of your asthma and treating your attacks.  To help prevent asthma attacks: ? Do not smoke. Stay away from secondhand smoke. ? Change your heating and air conditioning filter often. ? Limit your use of fireplaces and wood stoves. ? Get rid of pests (such as roaches and mice) and their droppings. ? Throw away plants if you  see mold on them. ? Clean your floors. Dust regularly. Use cleaning products that do not smell. ? Have someone vacuum when you are not home. Use a vacuum cleaner with a HEPA filter if possible. ? Replace carpet with wood, tile, or vinyl flooring. Carpet can trap animal skin flakes and dust. ? Use allergy-proof pillows, mattress covers, and box spring covers. ? Wash bed sheets and blankets every week in hot water and dry them in a dryer. ? Use blankets that are made of polyester or cotton. ? Clean bathrooms and kitchens with bleach. If possible, have someone repaint the walls in these rooms with mold-resistant paint. Keep out of the rooms that are being cleaned and painted. ? Wash hands often. Contact a doctor if:  You have make a whistling sound when breaking (wheeze), have shortness of breath, or have a cough even if taking medicine to prevent attacks.  The colored mucus you cough up (sputum) is thicker than usual.  The colored mucus you cough up changes from clear or white to yellow, green, gray, or bloody.  You have problems from the medicine you are taking such as: ? A rash. ? Itching. ? Swelling. ? Trouble breathing.  You need reliever medicines more than 2-3 times a week.  Your peak flow measurement is still at 50-79% of your personal best after following the action plan for 1 hour.  You have a fever. Get help right away if:  You seem to be worse and are not responding to medicine during an asthma attack.  You are short of breath even at rest.  You get short of breath when doing very little activity.  You have trouble eating, drinking, or talking.  You have chest pain.  You have a fast heartbeat.  Your lips or fingernails start to turn blue.  You are light-headed, dizzy, or faint.  Your peak flow is less than 50% of your personal best. This information is not intended to replace advice given to you by your health care provider. Make sure you discuss any questions  you have with your health care provider. Document Released: 12/21/2007 Document Revised: 12/10/2015 Document Reviewed: 01/31/2013 Elsevier Interactive Patient Education  2017 [employer].

## 2018-05-02 NOTE — Assessment & Plan Note (Signed)
Allergic rhinitis mold induced

## 2018-05-02 NOTE — Progress Notes (Addendum)
Subjective:    Patient ID: OSKER AYOUB, male    DOB: 1960/11/08, 57 y.o.   MRN: [id number]  57 y.o.M here 10/3 for sinusitis.  Hx of severe allergies.  Pt given Zpak and depo medrol injection  Helped with pain ,  aves sinus infection 3-4x per year. No bronchitis rx. Shortness of Breath  This is a recurrent problem. The current episode started more than 1 year ago. The problem occurs daily (dyspnea with exertion, 157ft or up steps). The problem has been gradually worsening. Associated symptoms include ear pain, headaches, leg swelling, orthopnea, PND, rhinorrhea, sputum production and wheezing. Pertinent negatives include no hemoptysis, leg pain, sore throat or swollen glands. The symptoms are aggravated by any activity, exercise, pollens, odors, fumes, lying flat and animal exposure. Associated symptoms comments: Mucus is foamy,   Chest is tight on the left side, notes wheezing esp at night. He has tried beta agonist inhalers (using SABA frequently) for the symptoms. The treatment provided moderate relief. His past medical history is significant for allergies. There is no history of asthma, CAD, DVT, a heart failure, PE or pneumonia. (Is on immunotherapy injections x 1 month, hx of afib)    Depression screen [facility] 2/9 05/02/2018 11/02/2016 09/22/2016 06/21/2016 07/10/2015  Decreased Interest 0 0 2 1 0  Down, Depressed, Hopeless 0 0 0 2 0  PHQ - 2 Score 0 0 2 3 0  Altered sleeping 2 - 0 0 -  Tired, decreased energy 0 - 2 0 -  Change in appetite 0 - 0 1 -  Feeling bad or failure about yourself  0 - 2 0 -  Trouble concentrating 0 - 0 1 -  Moving slowly or fidgety/restless 0 - 1 2 -  Suicidal thoughts 0 - 0 0 -  PHQ-9 Score 2 - 7 7 -   Housing issue with MOLD issues  Past Medical History:  Diagnosis Date  . Anxiety   . Bilateral carpal tunnel syndrome 01/10/2018  . Bipolar disorder ([place])   . Chronic lower back pain   . Depression   . Dysrhythmia    a-fib  . GERD (gastroesophageal reflux  disease)   . History of alcohol abuse   . History of nuclear stress test    Myoview 10/16: EF 50%, diaphragmatic attenuation, no ischemia, low risk  . Hypertension   . Migraine 2012-2014  . Moderate persistent asthma with acute exacerbation 05/02/2018  . PAF (paroxysmal atrial fibrillation) ([place]) 03/27/2015   a. Myoview neg for ischemia >> Flecainide started 10/16 >> FU ETT   . Schizophrenia ([place])   . Sciatica neuralgia   . Small vessel disease ([place])    Right basal ganglia stroke  . Stroke [facility]) "between 2012-2014"   residual "AF" (09/22/2015)     Family History  Problem Relation Age of Onset  . Heart disease Father   . Schizophrenia Sister      Social History   Socioeconomic History  . Marital status: Single    Spouse name: [name]  . Number of children: 3  . Years of education: HS  . Highest education level: Not on file  Occupational History    Comment: disabled  Social Needs  . Financial resource strain: Not on file  . Food insecurity:    Worry: Not on file    Inability: Not on file  . Transportation needs:    Medical: Not on file    Non-medical: Not on file  Tobacco Use  . Smoking status:  Current Some Day Smoker    Packs/day: 0.10    Years: 29.00    Pack years: 2.90    Types: Cigarettes  . Smokeless tobacco: Never Used  Substance and Sexual Activity  . Alcohol use: Yes    Alcohol/week: 0.0 standard drinks    Comment: drinks social  . Drug use: Yes    Types: Marijuana    Comment: smoked marijuana 01-02-17 for pain  . Sexual activity: Not Currently  Lifestyle  . Physical activity:    Days per week: Not on file    Minutes per session: Not on file  . Stress: Not on file  Relationships  . Social connections:    Talks on phone: Not on file    Gets together: Not on file    Attends religious service: Not on file    Active member of club or organization: Not on file    Attends meetings of clubs or organizations: Not on file    Relationship status: Not on file   . Intimate partner violence:    Fear of current or ex partner: Not on file    Emotionally abused: Not on file    Physically abused: Not on file    Forced sexual activity: Not on file  Other Topics Concern  . Not on file  Social History Narrative   Patient lives at home with [place].    Patient has 3 children 2 step.    Patient has 13 years of schooling.    Patient is right handed.      Allergies  Allergen Reactions  . Lisinopril Anaphylaxis    Swelling of lips and tongue.     Outpatient Medications Prior to Visit  Medication Sig Dispense Refill  . albuterol (PROAIR HFA) 108 (90 Base) MCG/ACT inhaler Inhale 2 puffs into the lungs every 4 (four) hours as needed for wheezing or shortness of breath. 1 Inhaler 1  . azelastine (OPTIVAR) 0.05 % ophthalmic solution Place 1 drop into both eyes 2 (two) times daily. 6 mL 5  . Azelastine HCl 0.15 % SOLN Place 1 spray into both nostrils 2 (two) times daily. 30 mL 5  . carvedilol (COREG) 12.5 MG tablet Take 1 tablet (12.5 mg total) by mouth 2 (two) times daily. 60 tablet 1  . cetirizine (ZYRTEC) 10 MG tablet TAKE 1 TABLET BY MOUTH EVERY DAY 30 tablet 2  . CYMBALTA 60 MG capsule Take 60 mg by mouth daily.  1  . dronedarone (MULTAQ) 400 MG tablet Take 1 tablet (400 mg total) by mouth 2 (two) times daily with a meal. 60 tablet 6  . ELIQUIS 5 MG TABS tablet TAKE 1 TABLET (5 MG TOTAL) BY MOUTH 2 (TWO) TIMES DAILY. 60 tablet 5  . hydrALAZINE (APRESOLINE) 100 MG tablet Take 1 tablet (100 mg total) by mouth 3 (three) times daily. 270 tablet 3  . ketotifen (ZADITOR) 0.025 % ophthalmic solution Place 1 drop into both eyes 2 (two) times daily. 10 mL 1  . lidocaine (LIDODERM) 5 % Place 1 patch onto the skin daily. Remove & Discard patch within 12 hours or as directed by MD 30 patch 2  . olopatadine (PATANOL) 0.1 % ophthalmic solution INSTILL 1 DROP INTO BOTH EYES TWICE A DAY 5 mL 5  . omeprazole (PRILOSEC) 20 MG capsule Take 1 capsule (20 mg total) by  mouth daily. 30 capsule 3  . prazosin (MINIPRESS) 1 MG capsule TAKE 1 CAPSULE BY MOUTH EVERYDAY AT BEDTIME  2  . tiZANidine (  ZANAFLEX) 4 MG tablet Take 1 tablet (4 mg total) by mouth every 8 (eight) hours as needed for muscle spasms. 90 tablet 2  . triamcinolone ointment (KENALOG) 0.5 % Apply 1 application topically 2 (two) times daily. 30 g 0  . fluticasone (FLONASE) 50 MCG/ACT nasal spray Place 2 sprays into both nostrils daily. 16 g 1  . diltiazem (CARDIZEM CD) 300 MG 24 hr capsule Take 1 capsule by mouth daily.  2  . oxyCODONE (OXY IR/ROXICODONE) 5 MG immediate release tablet Take 5 mg by mouth every 6 (six) hours as needed for severe pain.   0  . pregabalin (LYRICA) 50 MG capsule Take 50 mg by mouth 3 (three) times daily.  0  . azithromycin (ZITHROMAX) 250 MG tablet Take 2 today then 1 daily 6 tablet 0   No facility-administered medications prior to visit.     Review of Systems  HENT: Positive for ear pain and rhinorrhea. Negative for sore throat.   Respiratory: Positive for sputum production, shortness of breath and wheezing. Negative for hemoptysis.   Cardiovascular: Positive for orthopnea, leg swelling and PND.  Neurological: Positive for headaches.       Objective:   Physical Exam  Vitals:   05/02/18 1023  BP: 125/88  Pulse: 87  Resp: 18  Temp: 98.3 F (36.8 C)  TempSrc: Oral  SpO2: 99%  Weight: 243 lb (110.2 kg)  Height: 6\' 1"  (1.854 m)    Gen: Pleasant, well-nourished, in no distress,  normal affect  ENT: Severe nasal turbinate edema but no purulence,  mouth clear,  oropharynx clear, ++ postnasal drip  Neck: No JVD, no TMG, no carotid bruits  Lungs: No use of accessory muscles, no dullness to percussion, inspiratory and expiratory wheeze at both bases  Cardiovascular: RRR, heart sounds normal, no murmur or gallops, no peripheral edema  Abdomen: soft and NT, no HSM,  BS normal  Musculoskeletal: No deformities, no cyanosis or clubbing  Neuro: alert, non  focal  Skin: Warm, no lesions or rashes  No results found.  CXR 2016 NAD Pulmonary function test from September and July 2019 from allergy and asthma center office are reviewed and showed normal spirometry IgE testing showed elevated IgE levels with multiple allergens including mold allergies CXR 04/2018:  Normal, NAD    Assessment & Plan:  I personally reviewed all images and lab data in the [facility] system as well as any outside material available during this office visit and agree with the  radiology impressions.   Sinusitis, chronic Chronic sinusitis on the basis of chronic allergic rhinitis now improved status post antibiotic therapy however increased lower airway inflammation with moderate persistent asthma  Review asthma assessment  Moderate persistent asthma with acute exacerbation Moderate persistent asthma with acute flare exacerbated by ongoing mold exposure in the home Plan Prednisone 40 mg daily x5 days will be prescribed No additional antibiotics indicated Begin Flovent HFA 220 mcg strength 2 puffs twice daily Increase fluticasone nasal spray to 2 puffs twice daily for 5 days then reduce to 2 puffs daily thereafter A chest x-ray will be obtained   Allergic rhinitis caused by mold Allergic rhinitis mold induced  Tobacco use Ongoing tobacco use 5 minutes of smoking cessation counseling was given to the patient   Kyre was seen today for follow-up.  Diagnoses and all orders for this visit:  Moderate persistent asthma with acute exacerbation -     DG Chest 2 View; Future  Chronic frontal sinusitis -  fluticasone (FLONASE) 50 MCG/ACT nasal spray; Two sprays in each nostril twice a day for 5 days then reduce to once a day two sprays each nostril  Allergic rhinitis caused by mold  Tobacco use  Other orders -     fluticasone (FLOVENT HFA) 220 MCG/ACT inhaler; Inhale 2 puffs into the lungs 2 (two) times daily. -     predniSONE (DELTASONE) 10 MG tablet; Take 4  tablets (40 mg total) by mouth daily with breakfast for 5 days.   I also reviewed smoking cessation with this patient on this visit

## 2018-05-02 NOTE — Assessment & Plan Note (Signed)
Ongoing tobacco use 5 minutes of smoking cessation counseling was given to the patient

## 2018-05-02 NOTE — Assessment & Plan Note (Signed)
Chronic sinusitis on the basis of chronic allergic rhinitis now improved status post antibiotic therapy however increased lower airway inflammation with moderate persistent asthma  Review asthma assessment

## 2018-05-03 ENCOUNTER — Ambulatory Visit (HOSPITAL_COMMUNITY)
Admission: RE | Admit: 2018-05-03 | Discharge: 2018-05-03 | Disposition: A | Discharge status: Home / Self Care | Payer: Medicaid Other | Source: Ambulatory Visit | Attending: Critical Care Medicine | Admitting: Critical Care Medicine

## 2018-05-03 DIAGNOSIS — J4541 Moderate persistent asthma with (acute) exacerbation: Secondary | ICD-10-CM | POA: Insufficient documentation

## 2018-05-04 ENCOUNTER — Ambulatory Visit (INDEPENDENT_AMBULATORY_CARE_PROVIDER_SITE_OTHER): Payer: Medicaid Other | Admitting: *Deleted

## 2018-05-04 DIAGNOSIS — J309 Allergic rhinitis, unspecified: Secondary | ICD-10-CM

## 2018-05-04 MED ORDER — PREDNISONE 10 MG PO TABS: 40.0000 mg | ORAL_TABLET | Freq: Every day | ORAL | 0 refills | Status: AC

## 2018-05-04 MED ORDER — ALBUTEROL SULFATE HFA 108 (90 BASE) MCG/ACT IN AERS: 2.0000 | INHALATION_SPRAY | RESPIRATORY_TRACT | 4 refills | Status: DC | PRN

## 2018-05-04 MED ORDER — FLUTICASONE PROPIONATE HFA 220 MCG/ACT IN AERO: 2.0000 | INHALATION_SPRAY | Freq: Two times a day (BID) | RESPIRATORY_TRACT | 12 refills | Status: DC

## 2018-05-04 NOTE — Addendum Note (Signed)
Addended by: Elsie Stain on: 05/04/2018 08:13 AM   Modules accepted: Orders

## 2018-05-07 ENCOUNTER — Ambulatory Visit: Payer: Medicaid Other | Attending: Family Medicine | Admitting: Family Medicine

## 2018-05-07 ENCOUNTER — Encounter: Payer: Self-pay | Admitting: Family Medicine

## 2018-05-07 VITALS — BP 118/79 | HR 90 | Temp 97.7°F | Ht 73.0 in | Wt 249.6 lb

## 2018-05-07 DIAGNOSIS — J328 Other chronic sinusitis: Secondary | ICD-10-CM | POA: Diagnosis not present

## 2018-05-07 DIAGNOSIS — Z7901 Long term (current) use of anticoagulants: Secondary | ICD-10-CM | POA: Diagnosis not present

## 2018-05-07 DIAGNOSIS — Z79899 Other long term (current) drug therapy: Secondary | ICD-10-CM | POA: Diagnosis not present

## 2018-05-07 DIAGNOSIS — I10 Essential (primary) hypertension: Secondary | ICD-10-CM | POA: Insufficient documentation

## 2018-05-07 DIAGNOSIS — Z8673 Personal history of transient ischemic attack (TIA), and cerebral infarction without residual deficits: Secondary | ICD-10-CM | POA: Diagnosis not present

## 2018-05-07 DIAGNOSIS — Z23 Encounter for immunization: Secondary | ICD-10-CM | POA: Diagnosis not present

## 2018-05-07 DIAGNOSIS — G8929 Other chronic pain: Secondary | ICD-10-CM

## 2018-05-07 DIAGNOSIS — F419 Anxiety disorder, unspecified: Secondary | ICD-10-CM | POA: Insufficient documentation

## 2018-05-07 DIAGNOSIS — K219 Gastro-esophageal reflux disease without esophagitis: Secondary | ICD-10-CM | POA: Diagnosis not present

## 2018-05-07 DIAGNOSIS — J4540 Moderate persistent asthma, uncomplicated: Secondary | ICD-10-CM | POA: Insufficient documentation

## 2018-05-07 DIAGNOSIS — M5441 Lumbago with sciatica, right side: Secondary | ICD-10-CM | POA: Insufficient documentation

## 2018-05-07 DIAGNOSIS — F209 Schizophrenia, unspecified: Secondary | ICD-10-CM | POA: Insufficient documentation

## 2018-05-07 DIAGNOSIS — Z1159 Encounter for screening for other viral diseases: Secondary | ICD-10-CM | POA: Diagnosis not present

## 2018-05-07 DIAGNOSIS — E119 Type 2 diabetes mellitus without complications: Secondary | ICD-10-CM | POA: Diagnosis not present

## 2018-05-07 DIAGNOSIS — G5603 Carpal tunnel syndrome, bilateral upper limbs: Secondary | ICD-10-CM | POA: Diagnosis not present

## 2018-05-07 DIAGNOSIS — I480 Paroxysmal atrial fibrillation: Secondary | ICD-10-CM | POA: Diagnosis not present

## 2018-05-07 DIAGNOSIS — F319 Bipolar disorder, unspecified: Secondary | ICD-10-CM | POA: Insufficient documentation

## 2018-05-07 DIAGNOSIS — J329 Chronic sinusitis, unspecified: Secondary | ICD-10-CM | POA: Insufficient documentation

## 2018-05-07 DIAGNOSIS — Z888 Allergy status to other drugs, medicaments and biological substances status: Secondary | ICD-10-CM | POA: Insufficient documentation

## 2018-05-07 MED ORDER — CARVEDILOL 12.5 MG PO TABS: 12.5000 mg | ORAL_TABLET | Freq: Two times a day (BID) | ORAL | 3 refills | Status: DC

## 2018-05-07 MED ORDER — OMEPRAZOLE 20 MG PO CPDR: 20.0000 mg | DELAYED_RELEASE_CAPSULE | Freq: Every day | ORAL | 3 refills | Status: DC

## 2018-05-07 NOTE — Patient Instructions (Signed)

## 2018-05-07 NOTE — Progress Notes (Signed)
Subjective:  Patient ID: Jason Stokes, male    DOB: December 28, 1960  Age: 57 y.o. MRN: [id number]  CC: Hypertension   HPI Jason Stokes is a 57 year old male with a history of hypertension, type 2 diabetes mellitus (A1c 5.7), bipolar disorder, paroxysmal A. 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 He was seen by Dr. Joya Gaskins 1 week ago for asthma exacerbation and placed on prednisone and Flovent which he states he is yet to pick up from the pharmacy but complains his asthma is uncontrolled. He would like to know what Dr. Joya Gaskins said in his notes about mold and his lung condition as he is concerned he has mold in his house.  He also wants a referral to a pulmonologist- Dr Kara Mead ([phone]) "because I need him to tell me how much damage mold has done to my lungs" even though I have informed him Dr. Joya Gaskins is a pulmonologist. Chest x-ray ordered by Dr. Joya Gaskins reveals normal exam. He currently sees asthma and allergy center where he receives immunotherapy.  On inquiring about his back he states his pain is greater than 10/10 despite receiving pain medications from [facility] pain management. He has been compliant with his cardiology appointments and denies chest pains.  Past Medical History:  Diagnosis Date  . Anxiety   . Bilateral carpal tunnel syndrome 01/10/2018  . Bipolar disorder ([employer])   . Chronic lower back pain   . Depression   . Dysrhythmia    a-fib  . GERD (gastroesophageal reflux disease)   . History of alcohol abuse   . History of nuclear stress test    Myoview 10/16: EF 50%, diaphragmatic attenuation, no ischemia, low risk  . Hypertension   . Migraine 2012-2014  . Moderate persistent asthma with acute exacerbation 05/02/2018  . PAF (paroxysmal atrial fibrillation) ([place]) 03/27/2015   a. Myoview neg for ischemia >> Flecainide started 10/16 >> FU ETT   . Schizophrenia ([place])   .  Sciatica neuralgia   . Small vessel disease ([place])    Right basal ganglia stroke  . Stroke [facility]) "between 2012-2014"   residual "AF" (09/22/2015)    Past Surgical History:  Procedure Laterality Date  . ATRIAL FIBRILLATION ABLATION  09/22/2015  . CYST EXCISION  1996-97   surgery back of head   . ELECTROPHYSIOLOGIC STUDY N/A 09/22/2015   Procedure: Atrial Fibrillation Ablation;  Surgeon: Will [name], MD;  Location: [place] [facility];  Service: Cardiovascular;  Laterality: N/A;  . ELECTROPHYSIOLOGIC STUDY N/A 12/10/2015   Procedure: Atrial Fibrillation Ablation;  Surgeon: Will [name], MD;  Location: [place] [facility];  Service: Cardiovascular;  Laterality: N/A;  . ELECTROPHYSIOLOGIC STUDY N/A 12/11/2015   Procedure: Cardioversion;  Surgeon: Will [name], MD;  Location: [place] [facility];  Service: Cardiovascular;  Laterality: N/A;  . EXCISION MASS HEAD N/A 01/06/2017   Procedure: EXCISION MASS FOREHEAD;  Surgeon: Irene Limbo, MD;  Location: [place];  Service: Plastics;  Laterality: N/A;  . GANGLION CYST EXCISION Left   . INTERCOSTAL NERVE BLOCK  2005  . KNEE ARTHROSCOPY Right 2016    Allergies  Allergen Reactions  . Lisinopril Anaphylaxis    Swelling of lips and tongue.     Outpatient Medications Prior to Visit  Medication Sig Dispense Refill  . albuterol (PROAIR HFA) 108 (90 Base) MCG/ACT inhaler Inhale 2 puffs into the lungs every 4 (four) hours as needed  for wheezing or shortness of breath. 1 Inhaler 4  . azelastine (OPTIVAR) 0.05 % ophthalmic solution Place 1 drop into both eyes 2 (two) times daily. 6 mL 5  . Azelastine HCl 0.15 % SOLN Place 1 spray into both nostrils 2 (two) times daily. 30 mL 5  . cetirizine (ZYRTEC) 10 MG tablet TAKE 1 TABLET BY MOUTH EVERY DAY 30 tablet 2  . CYMBALTA 60 MG capsule Take 60 mg by mouth daily.  1  . diltiazem (CARDIZEM CD) 300 MG 24 hr capsule Take 1 capsule by mouth daily.  2  . dronedarone (MULTAQ)  400 MG tablet Take 1 tablet (400 mg total) by mouth 2 (two) times daily with a meal. 60 tablet 6  . ELIQUIS 5 MG TABS tablet TAKE 1 TABLET (5 MG TOTAL) BY MOUTH 2 (TWO) TIMES DAILY. 60 tablet 5  . fluticasone (FLONASE) 50 MCG/ACT nasal spray Two sprays in each nostril twice a day for 5 days then reduce to once a day two sprays each nostril 16 g 1  . fluticasone (FLOVENT HFA) 220 MCG/ACT inhaler Inhale 2 puffs into the lungs 2 (two) times daily. 1 Inhaler 12  . hydrALAZINE (APRESOLINE) 100 MG tablet Take 1 tablet (100 mg total) by mouth 3 (three) times daily. 270 tablet 3  . ketotifen (ZADITOR) 0.025 % ophthalmic solution Place 1 drop into both eyes 2 (two) times daily. 10 mL 1  . lidocaine (LIDODERM) 5 % Place 1 patch onto the skin daily. Remove & Discard patch within 12 hours or as directed by MD 30 patch 2  . olopatadine (PATANOL) 0.1 % ophthalmic solution INSTILL 1 DROP INTO BOTH EYES TWICE A DAY 5 mL 5  . oxyCODONE (OXY IR/ROXICODONE) 5 MG immediate release tablet Take 5 mg by mouth every 6 (six) hours as needed for severe pain.   0  . prazosin (MINIPRESS) 1 MG capsule TAKE 1 CAPSULE BY MOUTH EVERYDAY AT BEDTIME  2  . predniSONE (DELTASONE) 10 MG tablet Take 4 tablets (40 mg total) by mouth daily with breakfast for 5 days. 20 tablet 0  . pregabalin (LYRICA) 50 MG capsule Take 50 mg by mouth 3 (three) times daily.  0  . tiZANidine (ZANAFLEX) 4 MG tablet Take 1 tablet (4 mg total) by mouth every 8 (eight) hours as needed for muscle spasms. 90 tablet 2  . triamcinolone ointment (KENALOG) 0.5 % Apply 1 application topically 2 (two) times daily. 30 g 0  . carvedilol (COREG) 12.5 MG tablet Take 1 tablet (12.5 mg total) by mouth 2 (two) times daily. 60 tablet 1  . omeprazole (PRILOSEC) 20 MG capsule Take 1 capsule (20 mg total) by mouth daily. 30 capsule 3   No facility-administered medications prior to visit.     ROS Review of Systems  Constitutional: Negative for activity change and appetite  change.  HENT: Negative for sinus pressure and sore throat.   Eyes: Negative for visual disturbance.  Respiratory: Positive for shortness of breath and wheezing. Negative for cough and chest tightness.   Cardiovascular: Negative for chest pain and leg swelling.  Gastrointestinal: Negative for abdominal distention, abdominal pain, constipation and diarrhea.  Endocrine: Negative.   Genitourinary: Negative for dysuria.  Musculoskeletal: Positive for back pain. Negative for joint swelling and myalgias.  Skin: Negative for rash.  Allergic/Immunologic: Negative.   Neurological: Positive for numbness. Negative for weakness and light-headedness.  Psychiatric/Behavioral: Negative for dysphoric mood and suicidal ideas.    Objective:  BP 118/79   Pulse  90   Temp 97.7 F (36.5 C) (Oral)   Ht 6' 1"  (1.854 m)   Wt 249 lb 9.6 oz (113.2 kg)   SpO2 99%   BMI 32.93 kg/m   BP/Weight 05/07/2018 05/02/2018 27/11/1698  Systolic BP 174 944 967  Diastolic BP 79 88 82  Wt. (Lbs) 249.6 243 245  BMI 32.93 32.06 32.32    Physical Exam  Constitutional: He is oriented to person, place, and time. He appears well-developed and well-nourished.  Cardiovascular: Normal rate, normal heart sounds and intact distal pulses.  No murmur heard. Pulmonary/Chest: Effort normal and breath sounds normal. He has no wheezes. He has no rales. He exhibits no tenderness.  Abdominal: Soft. Bowel sounds are normal. He exhibits no distension and no mass. There is no tenderness.  Musculoskeletal: Normal range of motion. He exhibits tenderness (TTP in lumbar spine out of proportion to pressure applied).  Neurological: He is alert and oriented to person, place, and time.  Skin: Skin is warm and dry.  Psychiatric: He has a normal mood and affect.      Assessment & Plan:   1. Paroxysmal atrial fibrillation (HCC) Currently in sinus rhythm On anticoagulation with Eliquis and rate control with Coreg Followed by EP -  CMP14+EGFR - Lipid panel  2. Need for immunization against influenza - Flu Vaccine QUAD 36+ mos IM  3. Need for hepatitis C screening test - Hepatitis c antibody (reflex)  4. Other chronic sinusitis Stable Currently on Flonase and immunotherapy from allergy and immunology  5. Chronic left-sided low back pain with right-sided sciatica Uncontrolled Currently under the care of pain management  6. Moderate persistent asthma without complication Uncontrolled as per patient but my physical exam is unremarkable He would like a referral to pulmonary- Dr Kara Mead but I have informed him a referral is not indicated at this time as he is yet to pick up his inhaled steroids as prescribed by Dr. Joya Gaskins. Advised that if symptoms are uncontrolled on his Flovent and albuterol he can call the clinic and I am happy to place a referral to another pulmonologist for him. He is concerned about his respiratory symptoms worsened by the mold in his house and would like to see a pulmonologist  7. Essential hypertension Controlled   Meds ordered this encounter  Medications  . omeprazole (PRILOSEC) 20 MG capsule    Sig: Take 1 capsule (20 mg total) by mouth daily.    Dispense:  30 capsule    Refill:  3  . carvedilol (COREG) 12.5 MG tablet    Sig: Take 1 tablet (12.5 mg total) by mouth 2 (two) times daily.    Dispense:  60 tablet    Refill:  3    Follow-up: Return in about 3 months (around 08/07/2018) for follow up of chronic medical conditions.   Charlott Rakes MD

## 2018-05-08 ENCOUNTER — Telehealth: Payer: Self-pay | Admitting: Licensed Clinical Social Worker

## 2018-05-08 LAB — LIPID PANEL
Chol/HDL Ratio: 5.2 ratio — ABNORMAL HIGH (ref 0.0–5.0)
Cholesterol, Total: 171 mg/dL (ref 100–199)
HDL: 33 mg/dL — ABNORMAL LOW (ref >39)
LDL Calculated: 97 mg/dL (ref 0–99)
Triglycerides: 204 mg/dL — ABNORMAL HIGH (ref 0–149)
VLDL Cholesterol Cal: 41 mg/dL — ABNORMAL HIGH (ref 5–40)

## 2018-05-08 LAB — CMP14+EGFR
ALT: 17 IU/L (ref 0–44)
AST: 14 IU/L (ref 0–40)
Albumin/Globulin Ratio: 1.3 (ref 1.2–2.2)
Albumin: 4.2 g/dL (ref 3.5–5.5)
Alkaline Phosphatase: 82 IU/L (ref 39–117)
BUN/Creatinine Ratio: 8 — ABNORMAL LOW (ref 9–20)
BUN: 10 mg/dL (ref 6–24)
Bilirubin Total: 0.2 mg/dL (ref 0.0–1.2)
CO2: 23 mmol/L (ref 20–29)
Calcium: 9.7 mg/dL (ref 8.7–10.2)
Chloride: 104 mmol/L (ref 96–106)
Creatinine, Ser: 1.31 mg/dL — ABNORMAL HIGH (ref 0.76–1.27)
GFR calc Af Amer: 69 mL/min/{1.73_m2} (ref >59)
GFR calc non Af Amer: 60 mL/min/{1.73_m2} (ref >59)
Globulin, Total: 3.2 g/dL (ref 1.5–4.5)
Glucose: 100 mg/dL — ABNORMAL HIGH (ref 65–99)
Potassium: 4.1 mmol/L (ref 3.5–5.2)
Sodium: 141 mmol/L (ref 134–144)
Total Protein: 7.4 g/dL (ref 6.0–8.5)

## 2018-05-08 LAB — HEPATITIS C ANTIBODY (REFLEX): HCV Ab: <0.1 s/co ratio (ref 0.0–0.9)

## 2018-05-08 LAB — HCV COMMENT:

## 2018-05-08 NOTE — Telephone Encounter (Signed)
Call placed to patient. LCSWA introduced self and explained role at [facility]. Pt was informed of consult from Dr. Joya Gaskins regarding housing concerns.   Pt stated that he currently has black mold in the home that has not been successfully addressed by the landlord. Due to his housing conditions, pt's medical conditions are uncontrolled.   LCSWA informed pt of partnership with [employer] and their services. Pt agreed to [facility] making a referral for assistance.   Completed referral was faxed to [employer].

## 2018-05-11 ENCOUNTER — Ambulatory Visit (INDEPENDENT_AMBULATORY_CARE_PROVIDER_SITE_OTHER): Payer: Medicaid Other

## 2018-05-11 DIAGNOSIS — J309 Allergic rhinitis, unspecified: Secondary | ICD-10-CM | POA: Diagnosis not present

## 2018-05-14 ENCOUNTER — Other Ambulatory Visit: Payer: Self-pay | Admitting: Family Medicine

## 2018-05-14 DIAGNOSIS — J302 Other seasonal allergic rhinitis: Secondary | ICD-10-CM

## 2018-05-15 ENCOUNTER — Other Ambulatory Visit: Payer: Self-pay

## 2018-05-15 ENCOUNTER — Encounter: Payer: Self-pay | Admitting: Licensed Clinical Social Worker

## 2018-05-15 ENCOUNTER — Telehealth: Payer: Self-pay | Admitting: Family Medicine

## 2018-05-15 ENCOUNTER — Telehealth: Payer: Self-pay

## 2018-05-15 DIAGNOSIS — J321 Chronic frontal sinusitis: Secondary | ICD-10-CM

## 2018-05-15 MED ORDER — FLUTICASONE PROPIONATE 50 MCG/ACT NA SUSP: NASAL | 1 refills | Status: DC

## 2018-05-15 MED ORDER — FLUTICASONE PROPIONATE HFA 220 MCG/ACT IN AERO: 2.0000 | INHALATION_SPRAY | Freq: Two times a day (BID) | RESPIRATORY_TRACT | 12 refills | Status: DC

## 2018-05-15 NOTE — Telephone Encounter (Signed)
Patient was called and informed of lab results. 

## 2018-05-15 NOTE — Telephone Encounter (Signed)
-----   Message from Charlott Rakes, MD sent at 05/09/2018  1:14 PM EDT ----- Labs are stable.  Cholesterol is normal but triglycerides which is a type of cholesterol is elevated.  Please encouraged to comply with a low-cholesterol diet and lifestyle modifications.

## 2018-05-15 NOTE — BH Specialist Note (Signed)
LCSWA returned pt's call. Pt stated that an housing evaluation was completed last Friday; however, over the weekend he received a notice of violations from the landlord via certified mail. Pt provided a copy of this notice to [employer] today. He would like to discuss his tenant rights and options.   [place] notified Trinda Pascal and Devonne Doughty of patient's request for follow up.

## 2018-05-15 NOTE — Telephone Encounter (Signed)
Medication has been resent to [facility].

## 2018-05-15 NOTE — Telephone Encounter (Signed)
Pt came stating he never received his inhaler nor his nasal spray please resend his medications -fluticasone (FLONASE) 50 MCG/ACT nasal spray  -fluticasone (FLOVENT HFA) 220 MCG/ACT inhaler  To -[id number] - [place], [place] [address] Please follow up as soon as possible and he would also like a referral to a pulmonary specialist

## 2018-05-18 ENCOUNTER — Ambulatory Visit (INDEPENDENT_AMBULATORY_CARE_PROVIDER_SITE_OTHER): Payer: Medicaid Other | Admitting: *Deleted

## 2018-05-18 DIAGNOSIS — J309 Allergic rhinitis, unspecified: Secondary | ICD-10-CM

## 2018-05-25 ENCOUNTER — Ambulatory Visit (INDEPENDENT_AMBULATORY_CARE_PROVIDER_SITE_OTHER): Payer: Medicaid Other | Admitting: *Deleted

## 2018-05-25 DIAGNOSIS — J309 Allergic rhinitis, unspecified: Secondary | ICD-10-CM

## 2018-06-01 ENCOUNTER — Ambulatory Visit (INDEPENDENT_AMBULATORY_CARE_PROVIDER_SITE_OTHER): Payer: Medicaid Other

## 2018-06-01 DIAGNOSIS — J309 Allergic rhinitis, unspecified: Secondary | ICD-10-CM | POA: Diagnosis not present

## 2018-06-06 ENCOUNTER — Telehealth: Payer: Self-pay | Admitting: Neurology

## 2018-06-06 ENCOUNTER — Ambulatory Visit: Payer: Self-pay | Admitting: Neurology

## 2018-06-06 NOTE — Telephone Encounter (Signed)
This patient did not show for a revisit appointment today. 

## 2018-06-07 ENCOUNTER — Telehealth: Payer: Self-pay | Admitting: *Deleted

## 2018-06-07 ENCOUNTER — Encounter: Payer: Self-pay | Admitting: Neurology

## 2018-06-07 NOTE — Telephone Encounter (Signed)
Patient missed his appt yesterday.  He thought it was today.  The next available is in June.  He wants to be seen sooner than that. Please call.

## 2018-06-07 NOTE — Telephone Encounter (Signed)
Spoke with pt. and gave appt. 06/20/18, arrival time 1130 for 1200 appt/fim

## 2018-06-08 ENCOUNTER — Ambulatory Visit (INDEPENDENT_AMBULATORY_CARE_PROVIDER_SITE_OTHER): Payer: Medicaid Other

## 2018-06-08 DIAGNOSIS — J309 Allergic rhinitis, unspecified: Secondary | ICD-10-CM | POA: Diagnosis not present

## 2018-06-20 ENCOUNTER — Ambulatory Visit: Payer: Medicaid Other | Admitting: Neurology

## 2018-06-20 ENCOUNTER — Encounter: Payer: Self-pay | Admitting: Neurology

## 2018-06-20 ENCOUNTER — Other Ambulatory Visit: Payer: Self-pay

## 2018-06-20 VITALS — BP 148/93 | HR 93 | Resp 18 | Ht 73.0 in | Wt 253.0 lb

## 2018-06-20 DIAGNOSIS — G8929 Other chronic pain: Secondary | ICD-10-CM | POA: Diagnosis not present

## 2018-06-20 DIAGNOSIS — M5441 Lumbago with sciatica, right side: Secondary | ICD-10-CM

## 2018-06-20 MED ORDER — OXCARBAZEPINE 150 MG PO TABS: 150.0000 mg | ORAL_TABLET | Freq: Two times a day (BID) | ORAL | 2 refills | Status: DC

## 2018-06-20 MED ORDER — OXYCODONE HCL 10 MG PO TABS: 10.0000 mg | ORAL_TABLET | Freq: Three times a day (TID) | ORAL | 0 refills | Status: DC

## 2018-06-20 MED ORDER — PREGABALIN 100 MG PO CAPS: 100.0000 mg | ORAL_CAPSULE | Freq: Two times a day (BID) | ORAL | Status: DC

## 2018-06-20 NOTE — Progress Notes (Signed)
Reason for visit: Bilateral carpal tunnel syndrome, chronic back pain  Jason Stokes is an 57 y.o. male  History of present illness:  Mr. Jason Stokes is a 57 year old right-handed black male with a history of bilateral carpal tunnel syndrome, he has seen Dr. Fredna Dow for this.  The patient apparently is followed through the [facility] by Dr. Royce Macadamia.  He is on oxycodone 10 mg 3 times daily, he takes Lyrica 100 mg twice daily.  He has some burning sensations down the arms and legs which Lyrica has helped some.  He has a history of factitious extensor tremors of the legs which she claims still occurs 2 or 3 times a night.  The patient has been wearing a wrist splint for the right wrist.  He remains on Eliquis as a blood thinner.  The patient returns to this office for further evaluation.  Past Medical History:  Diagnosis Date  . Anxiety   . Bilateral carpal tunnel syndrome 01/10/2018  . Bipolar disorder ([place])   . Chronic lower back pain   . Depression   . Dysrhythmia    a-fib  . GERD (gastroesophageal reflux disease)   . History of alcohol abuse   . History of nuclear stress test    Myoview 10/16: EF 50%, diaphragmatic attenuation, no ischemia, low risk  . Hypertension   . Migraine 2012-2014  . Moderate persistent asthma with acute exacerbation 05/02/2018  . PAF (paroxysmal atrial fibrillation) ([place]) 03/27/2015   a. Myoview neg for ischemia >> Flecainide started 10/16 >> FU ETT   . Schizophrenia ([place])   . Sciatica neuralgia   . Small vessel disease ([place])    Right basal ganglia stroke  . Stroke [facility]) "between 2012-2014"   residual "AF" (09/22/2015)    Past Surgical History:  Procedure Laterality Date  . ATRIAL FIBRILLATION ABLATION  09/22/2015  . CYST EXCISION  1996-97   surgery back of head   . ELECTROPHYSIOLOGIC STUDY N/A 09/22/2015   Procedure: Atrial Fibrillation Ablation;  Surgeon: Will [name], MD;  Location: [place] [facility];  Service: Cardiovascular;  Laterality:  N/A;  . ELECTROPHYSIOLOGIC STUDY N/A 12/10/2015   Procedure: Atrial Fibrillation Ablation;  Surgeon: Will [name], MD;  Location: [place] [facility];  Service: Cardiovascular;  Laterality: N/A;  . ELECTROPHYSIOLOGIC STUDY N/A 12/11/2015   Procedure: Cardioversion;  Surgeon: Will [name], MD;  Location: [place] [facility];  Service: Cardiovascular;  Laterality: N/A;  . EXCISION MASS HEAD N/A 01/06/2017   Procedure: EXCISION MASS FOREHEAD;  Surgeon: Irene Limbo, MD;  Location: [place];  Service: Plastics;  Laterality: N/A;  . GANGLION CYST EXCISION Left   . INTERCOSTAL NERVE BLOCK  2005  . KNEE ARTHROSCOPY Right 2016    Family History  Problem Relation Age of Onset  . Heart disease Father   . Schizophrenia Sister     Social history:  reports that he has been smoking cigarettes. He has a 2.90 pack-year smoking history. He has never used smokeless tobacco. He reports that he drinks alcohol. He reports that he has current or past drug history. Drug: Marijuana.    Allergies  Allergen Reactions  . Lisinopril Anaphylaxis    Swelling of lips and tongue.    Medications:  Prior to Admission medications   Medication Sig Start Date End Date Taking? Authorizing Provider  albuterol (PROAIR HFA) 108 (90 Base) MCG/ACT inhaler Inhale 2 puffs into the lungs every 4 (four) hours as needed for wheezing or  shortness of breath. 05/04/18  Yes Elsie Stain, MD  azelastine (OPTIVAR) 0.05 % ophthalmic solution Place 1 drop into both eyes 2 (two) times daily. 04/10/18  Yes Kozlow, Donnamarie Poag, MD  Azelastine HCl 0.15 % SOLN Place 1 spray into both nostrils 2 (two) times daily. 04/10/18  Yes Kozlow, Donnamarie Poag, MD  carvedilol (COREG) 12.5 MG tablet Take 1 tablet (12.5 mg total) by mouth 2 (two) times daily. 05/07/18  Yes Newlin, Charlane Ferretti, MD  cetirizine (ZYRTEC) 10 MG tablet TAKE 1 TABLET BY MOUTH EVERY DAY 05/14/18  Yes Newlin, Enobong, MD  CYMBALTA 60 MG capsule Take 60 mg by  mouth daily. 04/29/14  Yes [provider]  diltiazem (CARDIZEM CD) 300 MG 24 hr capsule Take 1 capsule by mouth daily. 04/15/18  Yes [provider]  dronedarone (MULTAQ) 400 MG tablet Take 1 tablet (400 mg total) by mouth 2 (two) times daily with a meal. 02/21/18  Yes Camnitz, Will Hassell Done, MD  ELIQUIS 5 MG TABS tablet TAKE 1 TABLET (5 MG TOTAL) BY MOUTH 2 (TWO) TIMES DAILY. 08/23/17  Yes Camnitz, Will Hassell Done, MD  fluticasone [facility]) 50 MCG/ACT nasal spray Two sprays in each nostril twice a day for 5 days then reduce to once a day two sprays each nostril 05/15/18  Yes Newlin, Enobong, MD  fluticasone (FLOVENT HFA) 220 MCG/ACT inhaler Inhale 2 puffs into the lungs 2 (two) times daily. 05/15/18  Yes Charlott Rakes, MD  hydrALAZINE (APRESOLINE) 100 MG tablet Take 1 tablet (100 mg total) by mouth 3 (three) times daily. 04/16/18  Yes Camnitz, Will Hassell Done, MD  ketotifen (ZADITOR) 0.025 % ophthalmic solution Place 1 drop into both eyes 2 (two) times daily. 09/22/16  Yes Funches, Josalyn, MD  lidocaine (LIDODERM) 5 % Place 1 patch onto the skin daily. Remove & Discard patch within 12 hours or as directed by MD 10/31/17  Yes Charlott Rakes, MD  olopatadine (PATANOL) 0.1 % ophthalmic solution INSTILL 1 DROP INTO BOTH EYES TWICE A DAY 11/27/17  Yes Newlin, Enobong, MD  omeprazole (PRILOSEC) 20 MG capsule Take 1 capsule (20 mg total) by mouth daily. 05/07/18  Yes Newlin, Charlane Ferretti, MD  prazosin (MINIPRESS) 1 MG capsule TAKE 1 CAPSULE BY MOUTH EVERYDAY AT BEDTIME 09/04/17  Yes [provider]  tiZANidine (ZANAFLEX) 4 MG tablet Take 1 tablet (4 mg total) by mouth every 8 (eight) hours as needed for muscle spasms. 01/31/18  Yes Charlott Rakes, MD  triamcinolone ointment (KENALOG) 0.5 % Apply 1 application topically 2 (two) times daily. 07/10/15  Yes Funches, Josalyn, MD  OXcarbazepine (TRILEPTAL) 150 MG tablet Take 1 tablet (150 mg total) by mouth 2 (two) times daily. 06/20/18   Kathrynn Ducking,  MD  Oxycodone HCl 10 MG TABS Take 1 tablet (10 mg total) by mouth 3 (three) times daily. 06/20/18   Kathrynn Ducking, MD  pregabalin (LYRICA) 100 MG capsule Take 1 capsule (100 mg total) by mouth 2 (two) times daily. 06/20/18   Kathrynn Ducking, MD    ROS:  Out of a complete 14 system review of symptoms, the patient complains only of the following symptoms, and all other reviewed systems are negative.  Eye discharge, eye itching, eye redness Ringing in the ears, runny nose Chest pain, leg swelling Rectal pain Blood in the urine Environmental allergies, frequent infections  Blood pressure (!) 148/93, pulse 93, resp. rate 18, height 6\' 1"  (1.854 m), weight 253 lb (114.8 kg).  Physical Exam  General: The patient is alert  and cooperative at the time of the examination.  Skin: No significant peripheral edema is noted.   Neurologic Exam  Mental status: The patient is alert and oriented x 3 at the time of the examination. The patient has apparent normal recent and remote memory, with an apparently normal attention span and concentration ability.   Cranial nerves: Facial symmetry is present. Speech is normal, no aphasia or dysarthria is noted. Extraocular movements are full. Visual fields are full.  Pinprick sensation on the face is symmetric, the patient has decreased vibration sensation on the right face as compared to the left, he splits the midline with vibration sensation in the center of the forehead.  Motor: The patient has good strength in all 4 extremities.  Sensory examination: Soft touch sensation is decreased on the right arm and leg as compared to the left, pinprick sensation is decreased on the right arm and right leg as compared to the left.  Vibration sensation is decreased in the left arm as compared to the right.  Coordination: The patient has good finger-nose-finger and heel-to-shin bilaterally.  Gait and station: The patient has a normal gait. Tandem gait is normal.  Romberg is negative. No drift is seen.  Reflexes: Deep tendon reflexes are symmetric.   Assessment/Plan:  1.  Nonorganic sensory examination  2.  Factitious tremors, legs  3.  Chronic low back pain, arm discomfort  The patient is on Cymbalta and Lyrica, we will add low-dose Trileptal for the burning sensations that he complains of starting at 150 mg twice daily, he will call for any dose adjustments.  He will follow-up in 6 months, sooner if needed.  Jill Alexanders MD 06/20/2018 6:25 PM  [facility] [address] [place], [place] [address]  Phone [phone] Fax [phone]

## 2018-06-22 ENCOUNTER — Ambulatory Visit (INDEPENDENT_AMBULATORY_CARE_PROVIDER_SITE_OTHER): Payer: Medicaid Other | Admitting: *Deleted

## 2018-06-22 DIAGNOSIS — J309 Allergic rhinitis, unspecified: Secondary | ICD-10-CM | POA: Diagnosis not present

## 2018-06-29 ENCOUNTER — Ambulatory Visit (INDEPENDENT_AMBULATORY_CARE_PROVIDER_SITE_OTHER): Payer: Medicaid Other | Admitting: *Deleted

## 2018-06-29 DIAGNOSIS — J309 Allergic rhinitis, unspecified: Secondary | ICD-10-CM | POA: Diagnosis not present

## 2018-07-06 ENCOUNTER — Ambulatory Visit (INDEPENDENT_AMBULATORY_CARE_PROVIDER_SITE_OTHER): Payer: Medicaid Other | Admitting: *Deleted

## 2018-07-06 DIAGNOSIS — J309 Allergic rhinitis, unspecified: Secondary | ICD-10-CM | POA: Diagnosis not present

## 2018-07-13 ENCOUNTER — Ambulatory Visit (INDEPENDENT_AMBULATORY_CARE_PROVIDER_SITE_OTHER): Payer: Medicaid Other | Admitting: *Deleted

## 2018-07-13 DIAGNOSIS — J309 Allergic rhinitis, unspecified: Secondary | ICD-10-CM

## 2018-07-20 ENCOUNTER — Ambulatory Visit (INDEPENDENT_AMBULATORY_CARE_PROVIDER_SITE_OTHER): Payer: Medicaid Other | Admitting: *Deleted

## 2018-07-20 DIAGNOSIS — J309 Allergic rhinitis, unspecified: Secondary | ICD-10-CM | POA: Diagnosis not present

## 2018-07-24 ENCOUNTER — Encounter: Payer: Self-pay | Admitting: Critical Care Medicine

## 2018-07-24 ENCOUNTER — Other Ambulatory Visit: Payer: Self-pay

## 2018-07-24 ENCOUNTER — Ambulatory Visit: Payer: Medicaid Other | Attending: Critical Care Medicine | Admitting: Critical Care Medicine

## 2018-07-24 VITALS — BP 125/90 | HR 62 | Temp 98.8°F | Resp 20 | Wt 254.0 lb

## 2018-07-24 DIAGNOSIS — J3089 Other allergic rhinitis: Secondary | ICD-10-CM

## 2018-07-24 DIAGNOSIS — J329 Chronic sinusitis, unspecified: Secondary | ICD-10-CM | POA: Diagnosis not present

## 2018-07-24 DIAGNOSIS — Z720 Tobacco use: Secondary | ICD-10-CM

## 2018-07-24 DIAGNOSIS — R0789 Other chest pain: Secondary | ICD-10-CM | POA: Diagnosis not present

## 2018-07-24 DIAGNOSIS — Z79899 Other long term (current) drug therapy: Secondary | ICD-10-CM | POA: Diagnosis not present

## 2018-07-24 DIAGNOSIS — F209 Schizophrenia, unspecified: Secondary | ICD-10-CM | POA: Insufficient documentation

## 2018-07-24 DIAGNOSIS — G5603 Carpal tunnel syndrome, bilateral upper limbs: Secondary | ICD-10-CM | POA: Insufficient documentation

## 2018-07-24 DIAGNOSIS — Z1331 Encounter for screening for depression: Secondary | ICD-10-CM | POA: Insufficient documentation

## 2018-07-24 DIAGNOSIS — F319 Bipolar disorder, unspecified: Secondary | ICD-10-CM | POA: Diagnosis not present

## 2018-07-24 DIAGNOSIS — Z7901 Long term (current) use of anticoagulants: Secondary | ICD-10-CM | POA: Diagnosis not present

## 2018-07-24 DIAGNOSIS — F17210 Nicotine dependence, cigarettes, uncomplicated: Secondary | ICD-10-CM | POA: Insufficient documentation

## 2018-07-24 DIAGNOSIS — J328 Other chronic sinusitis: Secondary | ICD-10-CM | POA: Diagnosis not present

## 2018-07-24 DIAGNOSIS — Z8673 Personal history of transient ischemic attack (TIA), and cerebral infarction without residual deficits: Secondary | ICD-10-CM | POA: Diagnosis not present

## 2018-07-24 DIAGNOSIS — Z8249 Family history of ischemic heart disease and other diseases of the circulatory system: Secondary | ICD-10-CM | POA: Insufficient documentation

## 2018-07-24 DIAGNOSIS — I10 Essential (primary) hypertension: Secondary | ICD-10-CM | POA: Insufficient documentation

## 2018-07-24 DIAGNOSIS — J4520 Mild intermittent asthma, uncomplicated: Secondary | ICD-10-CM | POA: Diagnosis not present

## 2018-07-24 DIAGNOSIS — K219 Gastro-esophageal reflux disease without esophagitis: Secondary | ICD-10-CM | POA: Diagnosis not present

## 2018-07-24 NOTE — Progress Notes (Signed)
Subjective:    Patient ID: Jason Stokes, male    DOB: 1960-11-19, 58 y.o.   MRN: [id number]  57 y.o.M here 10/3 for sinusitis.  Hx of severe allergies.  Pt given Zpak and depo medrol injection  Helped with pain ,  aves sinus infection 3-4x per year. No bronchitis rx.  07/24/2018 Here for f/u from October Eval/Consult for severe persistent asthma  At last visit we: Prednisone 40 mg daily x5 days will be prescribed No additional antibiotics indicated Begin Flovent HFA 220 mcg strength 2 puffs twice daily Increase fluticasone nasal spray to 2 puffs twice daily for 5 days then reduce to 2 puffs daily thereafter  Now with left sided shoulder pain for 7  Notes numbness.  Pain is sharp , worse with deep breath.   Shortness of Breath  This is a recurrent problem. The current episode started more than 1 year ago. The problem occurs daily (dyspnea with exertion, 181ft or up steps). The problem has been gradually worsening. Associated symptoms include chest pain, leg swelling, orthopnea, PND, rhinorrhea, a sore throat, sputum production and wheezing. Pertinent negatives include no ear pain, headaches, hemoptysis, leg pain or swollen glands. The symptoms are aggravated by any activity, exercise, pollens, odors, fumes, lying flat and animal exposure. Associated symptoms comments: Mucus is foamy, and foamy   Chest is tight on the left side, notes wheezing esp at night. He has tried beta agonist inhalers (using SABA frequently) for the symptoms. The treatment provided moderate relief. His past medical history is significant for allergies. There is no history of asthma, CAD, DVT, a heart failure, PE or pneumonia. (Is on immunotherapy injections x 1 month, hx of afib)    Depression screen [facility] 2/9 07/24/2018 05/02/2018 11/02/2016 09/22/2016 06/21/2016  Decreased Interest 0 0 0 2 1  Down, Depressed, Hopeless 1 0 0 0 2  PHQ - 2 Score 1 0 0 2 3  Altered sleeping 0 2 - 0 0  Tired, decreased energy 1 0 - 2 0    Change in appetite 1 0 - 0 1  Feeling bad or failure about yourself  3 0 - 2 0  Trouble concentrating 0 0 - 0 1  Moving slowly or fidgety/restless 3 0 - 1 2  Suicidal thoughts 0 0 - 0 0  PHQ-9 Score 9 2 - 7 7   Housing issue with MOLD issues  Past Medical History:  Diagnosis Date  . Anxiety   . Bilateral carpal tunnel syndrome 01/10/2018  . Bipolar disorder ([place])   . Chronic lower back pain   . Depression   . Dysrhythmia    a-fib  . GERD (gastroesophageal reflux disease)   . History of alcohol abuse   . History of nuclear stress test    Myoview 10/16: EF 50%, diaphragmatic attenuation, no ischemia, low risk  . Hypertension   . Migraine 2012-2014  . Moderate persistent asthma with acute exacerbation 05/02/2018  . PAF (paroxysmal atrial fibrillation) ([place]) 03/27/2015   a. Myoview neg for ischemia >> Flecainide started 10/16 >> FU ETT   . Schizophrenia ([place])   . Sciatica neuralgia   . Small vessel disease ([place])    Right basal ganglia stroke  . Stroke [facility]) "between 2012-2014"   residual "AF" (09/22/2015)     Family History  Problem Relation Age of Onset  . Heart disease Father   . Schizophrenia Sister      Social History   Socioeconomic History  . Marital status: Single  Spouse name: [name]  . Number of children: 3  . Years of education: HS  . Highest education level: Not on file  Occupational History    Comment: disabled  Social Needs  . Financial resource strain: Not on file  . Food insecurity:    Worry: Not on file    Inability: Not on file  . Transportation needs:    Medical: Not on file    Non-medical: Not on file  Tobacco Use  . Smoking status: Current Some Day Smoker    Packs/day: 0.10    Years: 29.00    Pack years: 2.90    Types: Cigarettes  . Smokeless tobacco: Never Used  Substance and Sexual Activity  . Alcohol use: Yes    Alcohol/week: 0.0 standard drinks    Comment: drinks social  . Drug use: Yes    Types: Marijuana    Comment: smoked  marijuana 01-02-17 for pain  . Sexual activity: Not Currently  Lifestyle  . Physical activity:    Days per week: Not on file    Minutes per session: Not on file  . Stress: Not on file  Relationships  . Social connections:    Talks on phone: Not on file    Gets together: Not on file    Attends religious service: Not on file    Active member of club or organization: Not on file    Attends meetings of clubs or organizations: Not on file    Relationship status: Not on file  . Intimate partner violence:    Fear of current or ex partner: Not on file    Emotionally abused: Not on file    Physically abused: Not on file    Forced sexual activity: Not on file  Other Topics Concern  . Not on file  Social History Narrative   Patient lives at home with Strong.    Patient has 3 children 2 step.    Patient has 13 years of schooling.    Patient is right handed.      Allergies  Allergen Reactions  . Lisinopril Anaphylaxis    Swelling of lips and tongue.     Outpatient Medications Prior to Visit  Medication Sig Dispense Refill  . albuterol (PROAIR HFA) 108 (90 Base) MCG/ACT inhaler Inhale 2 puffs into the lungs every 4 (four) hours as needed for wheezing or shortness of breath. 1 Inhaler 4  . azelastine (OPTIVAR) 0.05 % ophthalmic solution Place 1 drop into both eyes 2 (two) times daily. 6 mL 5  . Azelastine HCl 0.15 % SOLN Place 1 spray into both nostrils 2 (two) times daily. 30 mL 5  . carvedilol (COREG) 12.5 MG tablet Take 1 tablet (12.5 mg total) by mouth 2 (two) times daily. 60 tablet 3  . cetirizine (ZYRTEC) 10 MG tablet TAKE 1 TABLET BY MOUTH EVERY DAY 30 tablet 2  . CYMBALTA 60 MG capsule Take 60 mg by mouth daily.  1  . diltiazem (CARDIZEM CD) 300 MG 24 hr capsule Take 1 capsule by mouth daily.  2  . dronedarone (MULTAQ) 400 MG tablet Take 1 tablet (400 mg total) by mouth 2 (two) times daily with a meal. 60 tablet 6  . ELIQUIS 5 MG TABS tablet TAKE 1 TABLET (5 MG TOTAL) BY MOUTH 2  (TWO) TIMES DAILY. 60 tablet 5  . fluticasone (FLONASE) 50 MCG/ACT nasal spray Two sprays in each nostril twice a day for 5 days then reduce to once a day two sprays  each nostril 16 g 1  . hydrALAZINE (APRESOLINE) 100 MG tablet Take 1 tablet (100 mg total) by mouth 3 (three) times daily. 270 tablet 3  . ketotifen (ZADITOR) 0.025 % ophthalmic solution Place 1 drop into both eyes 2 (two) times daily. 10 mL 1  . olopatadine (PATANOL) 0.1 % ophthalmic solution INSTILL 1 DROP INTO BOTH EYES TWICE A DAY 5 mL 5  . omeprazole (PRILOSEC) 20 MG capsule Take 1 capsule (20 mg total) by mouth daily. 30 capsule 3  . OXcarbazepine (TRILEPTAL) 150 MG tablet Take 1 tablet (150 mg total) by mouth 2 (two) times daily. 60 tablet 2  . Oxycodone HCl 10 MG TABS Take 1 tablet (10 mg total) by mouth 3 (three) times daily.  0  . prazosin (MINIPRESS) 1 MG capsule TAKE 1 CAPSULE BY MOUTH EVERYDAY AT BEDTIME  2  . pregabalin (LYRICA) 100 MG capsule Take 1 capsule (100 mg total) by mouth 2 (two) times daily.    Marland Kitchen tiZANidine (ZANAFLEX) 4 MG tablet Take 1 tablet (4 mg total) by mouth every 8 (eight) hours as needed for muscle spasms. 90 tablet 2  . fluticasone (FLOVENT HFA) 220 MCG/ACT inhaler Inhale 2 puffs into the lungs 2 (two) times daily. 1 Inhaler 12  . triamcinolone ointment (KENALOG) 0.5 % Apply 1 application topically 2 (two) times daily. (Patient not taking: Reported on 07/24/2018) 30 g 0  . lidocaine (LIDODERM) 5 % Place 1 patch onto the skin daily. Remove & Discard patch within 12 hours or as directed by MD (Patient not taking: Reported on 07/24/2018) 30 patch 2   No facility-administered medications prior to visit.     Review of Systems  HENT: Positive for rhinorrhea and sore throat. Negative for ear pain.   Respiratory: Positive for sputum production, shortness of breath and wheezing. Negative for hemoptysis.   Cardiovascular: Positive for chest pain, orthopnea, leg swelling and PND.  Neurological: Negative for  headaches.       Objective:   Physical Exam   Vitals:   07/24/18 1117  BP: 125/90  Pulse: 62  Resp: 20  Temp: 98.8 F (37.1 C)  TempSrc: Oral  SpO2: 98%  Weight: 254 lb (115.2 kg)    Gen: Pleasant, well-nourished, in no distress,  normal affect  ENT: Improved nasal turbinate edema and no purulence,  mouth clear,  oropharynx clear, + postnasal drip  Neck: No JVD, no TMG, no carotid bruits  Lungs: No use of accessory muscles, no dullness to percussion, no wheezes and clear breath sounds  Cardiovascular: RRR, heart sounds normal, no murmur or gallops, no peripheral edema  Abdomen: soft and NT, no HSM,  BS normal  Musculoskeletal: No deformities, no cyanosis or clubbing  Neuro: alert, non focal  Skin: Warm, no lesions or rashes  No results found.  CXR 2016 NAD Pulmonary function test from September and July 2019 from allergy and asthma center office are reviewed and showed normal spirometry IgE testing showed elevated IgE levels with multiple allergens including mold allergies CXR 04/2018:  Normal, NAD    Assessment & Plan:  I personally reviewed all images and lab data in the [facility] system as well as any outside material available during this office visit and agree with the  radiology impressions.   Asthma, mild intermittent Mild intermittent asthma with no evidence for acute flare and no evidence that inhaled Flovent has offered additional benefit  Plan Continue aggressive treatment of allergic rhinitis per allergy and immunology physician Discontinue Flovent inhaler  Continue to advised patient to try to work on a mitigation strategy for the mold exposure in the patient's home   Sinusitis, chronic Chronic sinusitis which now is improved  Allergic rhinitis caused by mold Ongoing mold exposure in the patient's rental home The patient has gone to the [employer] and other stakeholders to attempt to mitigate this problem but has not been  successful The patient is not able to move out of his current housing  Musculoskeletal chest pain Musculoskeletal chest wall pain without evidence of acute intrapulmonary or cardiac process  The patient was advised to continue use his as needed muscle relaxant and local heat  Tobacco use Ongoing tobacco use  Tobacco cessation counseling was given to the patient   Diagnoses and all orders for this visit:  Musculoskeletal chest pain  Mild intermittent asthma without complication  Allergic rhinitis caused by mold  Other chronic sinusitis  Tobacco use   I also reviewed smoking cessation with this patient on this visit

## 2018-07-24 NOTE — Assessment & Plan Note (Signed)
Musculoskeletal chest wall pain without evidence of acute intrapulmonary or cardiac process  The patient was advised to continue use his as needed muscle relaxant and local heat

## 2018-07-24 NOTE — Assessment & Plan Note (Signed)
Mild intermittent asthma with no evidence for acute flare and no evidence that inhaled Flovent has offered additional benefit  Plan Continue aggressive treatment of allergic rhinitis per allergy and immunology physician Discontinue Flovent inhaler Continue to advised patient to try to work on a mitigation strategy for the mold exposure in the patient's home

## 2018-07-24 NOTE — Assessment & Plan Note (Signed)
Ongoing mold exposure in the patient's rental home The patient has gone to the [employer] and other stakeholders to attempt to mitigate this problem but has not been successful The patient is not able to move out of his current housing

## 2018-07-24 NOTE — Assessment & Plan Note (Signed)
Chronic sinusitis which now is improved

## 2018-07-24 NOTE — Patient Instructions (Addendum)
Discontinue Flovent Continue your nasal steroid spray and other medications from allergy and asthma physician You may use a heating pad for your chest wall pain and use your as needed muscle relaxant No additional testing indicated Dr. Joya Gaskins is available to see you again in 3 months sooner if your breathing worsens Keep scheduled primary care visits status

## 2018-07-24 NOTE — Assessment & Plan Note (Signed)
Ongoing tobacco use  Tobacco cessation counseling was given to the patient

## 2018-07-24 NOTE — Progress Notes (Signed)
Pain behind shoulder blade with deep breathes x 8 days

## 2018-07-31 ENCOUNTER — Telehealth: Payer: Self-pay | Admitting: *Deleted

## 2018-07-31 MED ORDER — OLOPATADINE HCL 0.7 % OP SOLN: 1.0000 [drp] | Freq: Every day | OPHTHALMIC | 5 refills | Status: DC

## 2018-07-31 NOTE — Telephone Encounter (Signed)
Azelastine 0.05% eye drops are not covered by insurance patient must try olopatadine 0.2% or Pazeo Dr Neldon Mc please advise.

## 2018-07-31 NOTE — Telephone Encounter (Signed)
try olopatadine 0.2% or Pazeo 1 drop daily

## 2018-07-31 NOTE — Telephone Encounter (Signed)
New rx sent in

## 2018-08-03 ENCOUNTER — Other Ambulatory Visit: Payer: Self-pay

## 2018-08-03 ENCOUNTER — Ambulatory Visit (INDEPENDENT_AMBULATORY_CARE_PROVIDER_SITE_OTHER): Payer: Medicaid Other | Admitting: *Deleted

## 2018-08-03 DIAGNOSIS — J309 Allergic rhinitis, unspecified: Secondary | ICD-10-CM

## 2018-08-07 ENCOUNTER — Ambulatory Visit: Payer: Medicaid Other | Admitting: Family Medicine

## 2018-08-09 ENCOUNTER — Telehealth: Payer: Self-pay

## 2018-08-09 NOTE — Telephone Encounter (Signed)
Patient stated that he has been having some left side neck pain that he thinks is caused by his nerves. He stated he would like to see Dr. Jannifer Franklin. Please advise

## 2018-08-09 NOTE — Telephone Encounter (Signed)
I contacted the pt and lvm advising we could schedule him for an appt with Dr. Jannifer Franklin on 08/14/18 at 7:30, check in time of 7 am. Pt advised to call back if he is agreeable to this appt.

## 2018-08-10 ENCOUNTER — Ambulatory Visit (INDEPENDENT_AMBULATORY_CARE_PROVIDER_SITE_OTHER): Payer: Medicaid Other

## 2018-08-10 DIAGNOSIS — J309 Allergic rhinitis, unspecified: Secondary | ICD-10-CM | POA: Diagnosis not present

## 2018-08-13 NOTE — Telephone Encounter (Signed)
I contacted the pt back and was able to speak with the pt. He was agreeable the appt for tomorrow 08/14/18 at 7:30 check in time of 7 am. Appt scheduled.

## 2018-08-14 ENCOUNTER — Encounter: Payer: Self-pay | Admitting: Neurology

## 2018-08-14 ENCOUNTER — Ambulatory Visit: Payer: Medicaid Other | Attending: Family Medicine | Admitting: Family Medicine

## 2018-08-14 ENCOUNTER — Encounter: Payer: Self-pay | Admitting: Family Medicine

## 2018-08-14 ENCOUNTER — Ambulatory Visit: Payer: Medicaid Other | Admitting: Neurology

## 2018-08-14 VITALS — BP 152/86 | HR 85 | Ht 73.0 in | Wt 257.0 lb

## 2018-08-14 VITALS — BP 134/83 | HR 83 | Temp 97.7°F | Ht 73.0 in | Wt 252.2 lb

## 2018-08-14 DIAGNOSIS — M5416 Radiculopathy, lumbar region: Secondary | ICD-10-CM

## 2018-08-14 DIAGNOSIS — F209 Schizophrenia, unspecified: Secondary | ICD-10-CM | POA: Diagnosis not present

## 2018-08-14 DIAGNOSIS — K219 Gastro-esophageal reflux disease without esophagitis: Secondary | ICD-10-CM | POA: Insufficient documentation

## 2018-08-14 DIAGNOSIS — Z1211 Encounter for screening for malignant neoplasm of colon: Secondary | ICD-10-CM

## 2018-08-14 DIAGNOSIS — Z79899 Other long term (current) drug therapy: Secondary | ICD-10-CM | POA: Diagnosis not present

## 2018-08-14 DIAGNOSIS — J3089 Other allergic rhinitis: Secondary | ICD-10-CM

## 2018-08-14 DIAGNOSIS — R0981 Nasal congestion: Secondary | ICD-10-CM | POA: Insufficient documentation

## 2018-08-14 DIAGNOSIS — J45909 Unspecified asthma, uncomplicated: Secondary | ICD-10-CM | POA: Insufficient documentation

## 2018-08-14 DIAGNOSIS — F419 Anxiety disorder, unspecified: Secondary | ICD-10-CM | POA: Insufficient documentation

## 2018-08-14 DIAGNOSIS — G8929 Other chronic pain: Secondary | ICD-10-CM

## 2018-08-14 DIAGNOSIS — I480 Paroxysmal atrial fibrillation: Secondary | ICD-10-CM | POA: Insufficient documentation

## 2018-08-14 DIAGNOSIS — G5603 Carpal tunnel syndrome, bilateral upper limbs: Secondary | ICD-10-CM | POA: Diagnosis not present

## 2018-08-14 DIAGNOSIS — Z888 Allergy status to other drugs, medicaments and biological substances status: Secondary | ICD-10-CM | POA: Insufficient documentation

## 2018-08-14 DIAGNOSIS — Z7901 Long term (current) use of anticoagulants: Secondary | ICD-10-CM | POA: Diagnosis not present

## 2018-08-14 DIAGNOSIS — M5441 Lumbago with sciatica, right side: Secondary | ICD-10-CM | POA: Diagnosis not present

## 2018-08-14 DIAGNOSIS — F2089 Other schizophrenia: Secondary | ICD-10-CM

## 2018-08-14 DIAGNOSIS — I10 Essential (primary) hypertension: Secondary | ICD-10-CM | POA: Diagnosis present

## 2018-08-14 DIAGNOSIS — M542 Cervicalgia: Secondary | ICD-10-CM

## 2018-08-14 DIAGNOSIS — E119 Type 2 diabetes mellitus without complications: Secondary | ICD-10-CM | POA: Diagnosis not present

## 2018-08-14 DIAGNOSIS — Z8673 Personal history of transient ischemic attack (TIA), and cerebral infarction without residual deficits: Secondary | ICD-10-CM | POA: Insufficient documentation

## 2018-08-14 DIAGNOSIS — F319 Bipolar disorder, unspecified: Secondary | ICD-10-CM | POA: Diagnosis not present

## 2018-08-14 MED ORDER — CARVEDILOL 12.5 MG PO TABS: 12.5000 mg | ORAL_TABLET | Freq: Two times a day (BID) | ORAL | 3 refills | Status: DC

## 2018-08-14 MED ORDER — APIXABAN 5 MG PO TABS: 5.0000 mg | ORAL_TABLET | Freq: Two times a day (BID) | ORAL | 5 refills | Status: DC

## 2018-08-14 MED ORDER — OMEPRAZOLE 20 MG PO CPDR: 20.0000 mg | DELAYED_RELEASE_CAPSULE | Freq: Every day | ORAL | 3 refills | Status: DC

## 2018-08-14 NOTE — Progress Notes (Signed)
Reason for visit: Neck pain, chronic low back pain  Jason Stokes is an 58 y.o. male  History of present illness:  Jason Stokes is a 57 year old right-handed white male with a history of chronic low back pain.  The patient is followed by Dr. Royce Macadamia through the [facility].  The patient comes back today on an urgent basis claiming that he suddenly began to have severe back pain in the midportion of December 2019.  The patient had spread of the pain up the back into the left neck.  He denies any pain down the arms on either side, but does have some numbness in both hands associated with bilateral carpal tunnel syndrome, left greater than right.  The patient had neck stiffness.  He was given shots in the hips and then placed on a prednisone Dosepak which also helped the neck problem.  The patient is feeling some better but not back to normal.  He denies any weakness of the extremities.  He uses a cane for ambulation, he uses a lumbar support device.  He returns for an evaluation.  He does have tizanidine but he takes it only if he feels he needs it.  The patient remains on Lyrica, Cymbalta, and low-dose Trileptal.  Past Medical History:  Diagnosis Date  . Anxiety   . Bilateral carpal tunnel syndrome 01/10/2018  . Bipolar disorder ([place])   . Chronic lower back pain   . Depression   . Dysrhythmia    a-fib  . GERD (gastroesophageal reflux disease)   . History of alcohol abuse   . History of nuclear stress test    Myoview 10/16: EF 50%, diaphragmatic attenuation, no ischemia, low risk  . Hypertension   . Migraine 2012-2014  . Moderate persistent asthma with acute exacerbation 05/02/2018  . PAF (paroxysmal atrial fibrillation) ([place]) 03/27/2015   a. Myoview neg for ischemia >> Flecainide started 10/16 >> FU ETT   . Schizophrenia ([place])   . Sciatica neuralgia   . Small vessel disease ([place])    Right basal ganglia stroke  . Stroke [facility]) "between 2012-2014"   residual "AF" (09/22/2015)     Past Surgical History:  Procedure Laterality Date  . ATRIAL FIBRILLATION ABLATION  09/22/2015  . CYST EXCISION  1996-97   surgery back of head   . ELECTROPHYSIOLOGIC STUDY N/A 09/22/2015   Procedure: Atrial Fibrillation Ablation;  Surgeon: Will [name], MD;  Location: [place] CV [facility];  Service: Cardiovascular;  Laterality: N/A;  . ELECTROPHYSIOLOGIC STUDY N/A 12/10/2015   Procedure: Atrial Fibrillation Ablation;  Surgeon: Will [name], MD;  Location: [place] [facility];  Service: Cardiovascular;  Laterality: N/A;  . ELECTROPHYSIOLOGIC STUDY N/A 12/11/2015   Procedure: Cardioversion;  Surgeon: Will [name], MD;  Location: [place] [facility];  Service: Cardiovascular;  Laterality: N/A;  . EXCISION MASS HEAD N/A 01/06/2017   Procedure: EXCISION MASS FOREHEAD;  Surgeon: Irene Limbo, MD;  Location: [place];  Service: Plastics;  Laterality: N/A;  . GANGLION CYST EXCISION Left   . INTERCOSTAL NERVE BLOCK  2005  . KNEE ARTHROSCOPY Right 2016    Family History  Problem Relation Age of Onset  . Heart disease Father   . Schizophrenia Sister     Social history:  reports that he has been smoking cigarettes. He has a 2.90 pack-year smoking history. He has never used smokeless tobacco. He reports current alcohol use. He reports current drug use. Drug: Marijuana.    Allergies  Allergen Reactions  . Lisinopril Anaphylaxis    Swelling of lips and tongue.    Medications:  Prior to Admission medications   Medication Sig Start Date End Date Taking? Authorizing Provider  albuterol (PROAIR HFA) 108 (90 Base) MCG/ACT inhaler Inhale 2 puffs into the lungs every 4 (four) hours as needed for wheezing or shortness of breath. 05/04/18   Elsie Stain, MD  Azelastine HCl 0.15 % SOLN Place 1 spray into both nostrils 2 (two) times daily. 04/10/18   Kozlow, Donnamarie Poag, MD  carvedilol (COREG) 12.5 MG tablet Take 1 tablet (12.5 mg total) by mouth 2 (two) times daily.  05/07/18   Charlott Rakes, MD  cetirizine (ZYRTEC) 10 MG tablet TAKE 1 TABLET BY MOUTH EVERY DAY 05/14/18   Charlott Rakes, MD  CYMBALTA 60 MG capsule Take 60 mg by mouth daily. 04/29/14   [provider]  diltiazem (CARDIZEM CD) 300 MG 24 hr capsule Take 1 capsule by mouth daily. 04/15/18   [provider]  dronedarone (MULTAQ) 400 MG tablet Take 1 tablet (400 mg total) by mouth 2 (two) times daily with a meal. 02/21/18   Camnitz, Will Hassell Done, MD  ELIQUIS 5 MG TABS tablet TAKE 1 TABLET (5 MG TOTAL) BY MOUTH 2 (TWO) TIMES DAILY. 08/23/17   Camnitz, Will Hassell Done, MD  fluticasone (FLONASE) 50 MCG/ACT nasal spray Two sprays in each nostril twice a day for 5 days then reduce to once a day two sprays each nostril 05/15/18   Charlott Rakes, MD  hydrALAZINE (APRESOLINE) 100 MG tablet Take 1 tablet (100 mg total) by mouth 3 (three) times daily. 04/16/18   Camnitz, Ocie Doyne, MD  ketotifen (ZADITOR) 0.025 % ophthalmic solution Place 1 drop into both eyes 2 (two) times daily. 09/22/16   Funches, Adriana Mccallum, MD  Olopatadine HCl (PAZEO) 0.7 % SOLN Place 1 drop into both eyes daily. 07/31/18   Kozlow, Donnamarie Poag, MD  omeprazole (PRILOSEC) 20 MG capsule Take 1 capsule (20 mg total) by mouth daily. 05/07/18   Charlott Rakes, MD  OXcarbazepine (TRILEPTAL) 150 MG tablet Take 1 tablet (150 mg total) by mouth 2 (two) times daily. 06/20/18   Kathrynn Ducking, MD  Oxycodone HCl 10 MG TABS Take 1 tablet (10 mg total) by mouth 3 (three) times daily. 06/20/18   Kathrynn Ducking, MD  prazosin (MINIPRESS) 1 MG capsule TAKE 1 CAPSULE BY MOUTH EVERYDAY AT BEDTIME 09/04/17   [provider]  pregabalin (LYRICA) 100 MG capsule Take 1 capsule (100 mg total) by mouth 2 (two) times daily. 06/20/18   Kathrynn Ducking, MD  tiZANidine (ZANAFLEX) 4 MG tablet Take 1 tablet (4 mg total) by mouth every 8 (eight) hours as needed for muscle spasms. 01/31/18   Charlott Rakes, MD  triamcinolone ointment (KENALOG) 0.5 % Apply 1  application topically 2 (two) times daily. Patient not taking: Reported on 07/24/2018 07/10/15   Boykin Nearing, MD    ROS:  Out of a complete 14 system review of symptoms, the patient complains only of the following symptoms, and all other reviewed systems are negative.  Ringing in the ears Eye pain Neck pain  Blood pressure (!) 152/86, pulse 85, height 6\' 1"  (1.854 m), weight 257 lb (116.6 kg).  Physical Exam  General: The patient is alert and cooperative at the time of the examination.  Neuromuscular: Range of movement the cervical spine is relatively full when turning the head to the right, lacks about 35 degrees of lateral rotation when turning  to the left.  Skin: No significant peripheral edema is noted.   Neurologic Exam  Mental status: The patient is alert and oriented x 3 at the time of the examination. The patient has apparent normal recent and remote memory, with an apparently normal attention span and concentration ability.   Cranial nerves: Facial symmetry is present. Speech is normal, no aphasia or dysarthria is noted. Extraocular movements are full. Visual fields are full.  Motor: The patient has good strength in all 4 extremities.  Sensory examination: Soft touch sensation is symmetric on the face, arms, and legs.  Coordination: The patient has good finger-nose-finger and heel-to-shin bilaterally.  Gait and station: The patient has a normal gait, the patient uses a cane for ambulation. Tandem gait is slightly unsteady. Romberg is negative. No drift is seen.  Reflexes: Deep tendon reflexes are symmetric.   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 C4-5 and C5-6 also shows similar but milder changes. Compared with previous MRI from 10/08/2014 these changes appear to have progressed.  * MRI scan images were reviewed online. I agree with the  written report.    Assessment/Plan:  1.  Chronic low back pain  2.  New onset left sided neck pain  3.  Bilateral carpal tunnel syndrome  4.  Factitious leg tremors  The patient is complaining of neuromuscular discomfort in the left neck that began in December.  He has improved with the use of steroids, but he is not completely back to his usual baseline.  The patient will go on tizanidine on a scheduled basis taking 4 mg twice daily for the next 4 weeks.  He will be sent for physical therapy for neuromuscular therapy on the cervical spine.  He will follow-up for his next scheduled visit in June 2020.  Jill Alexanders MD 08/14/2018 7:57 AM  [facility] [address] [place], [place] [address]  Phone [phone] Fax [phone]

## 2018-08-14 NOTE — Patient Instructions (Signed)
We will get physical therapy for the neck. For the next 4 weeks, start on Tizanidine 4 mg tablets twice a day.

## 2018-08-14 NOTE — Progress Notes (Signed)
Subjective:  Patient ID: Jason Stokes, male    DOB: 09-30-1960  Age: 58 y.o. MRN: [id number]  CC: Hypertension   HPI Jason Stokes is a 58 year old male with a history of hypertension, type 2 diabetes mellitus (A1c 5.7), bipolar disorder, paroxysmal A. 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 He complains of 1 week history of nasal congestion,sinus pressure  Tinnitus but no cough or fever and has been compliant with his nasal sprays, Zyrtec and currently received Immunotherapy for Allergic rhinitis with his allergist with the last one received on 08/10/18.  His chronic low back pain is managed by [facility] - Dr Royce Macadamia and he sees Dr Jannifer Franklin for carpal tunnel syndrome. Doing well on his anticoagulation for Afib and will be scheduling an appointment with his EP Cardiologist.  Past Medical History:  Diagnosis Date  . Anxiety   . Bilateral carpal tunnel syndrome 01/10/2018  . Bipolar disorder ([place])   . Chronic lower back pain   . Depression   . Dysrhythmia    a-fib  . GERD (gastroesophageal reflux disease)   . History of alcohol abuse   . History of nuclear stress test    Myoview 10/16: EF 50%, diaphragmatic attenuation, no ischemia, low risk  . Hypertension   . Migraine 2012-2014  . Moderate persistent asthma with acute exacerbation 05/02/2018  . PAF (paroxysmal atrial fibrillation) ([place]) 03/27/2015   a. Myoview neg for ischemia >> Flecainide started 10/16 >> FU ETT   . Schizophrenia ([place])   . Sciatica neuralgia   . Small vessel disease ([place])    Right basal ganglia stroke  . Stroke [facility]) "between 2012-2014"   residual "AF" (09/22/2015)    Past Surgical History:  Procedure Laterality Date  . ATRIAL FIBRILLATION ABLATION  09/22/2015  . CYST EXCISION  1996-97   surgery back of head   . ELECTROPHYSIOLOGIC STUDY N/A 09/22/2015   Procedure: Atrial Fibrillation Ablation;  Surgeon:  Will [name], MD;  Location: [place] [facility];  Service: Cardiovascular;  Laterality: N/A;  . ELECTROPHYSIOLOGIC STUDY N/A 12/10/2015   Procedure: Atrial Fibrillation Ablation;  Surgeon: Will [name], MD;  Location: [place] [facility];  Service: Cardiovascular;  Laterality: N/A;  . ELECTROPHYSIOLOGIC STUDY N/A 12/11/2015   Procedure: Cardioversion;  Surgeon: Will [name], MD;  Location: [place] [facility];  Service: Cardiovascular;  Laterality: N/A;  . EXCISION MASS HEAD N/A 01/06/2017   Procedure: EXCISION MASS FOREHEAD;  Surgeon: Irene Limbo, MD;  Location: [place];  Service: Plastics;  Laterality: N/A;  . GANGLION CYST EXCISION Left   . INTERCOSTAL NERVE BLOCK  2005  . KNEE ARTHROSCOPY Right 2016    Allergies  Allergen Reactions  . Lisinopril Anaphylaxis    Swelling of lips and tongue.     Outpatient Medications Prior to Visit  Medication Sig Dispense Refill  . albuterol (PROAIR HFA) 108 (90 Base) MCG/ACT inhaler Inhale 2 puffs into the lungs every 4 (four) hours as needed for wheezing or shortness of breath. 1 Inhaler 4  . Azelastine HCl 0.15 % SOLN Place 1 spray into both nostrils 2 (two) times daily. 30 mL 5  . cetirizine (ZYRTEC) 10 MG tablet TAKE 1 TABLET BY MOUTH EVERY DAY 30 tablet 2  . CYMBALTA 60 MG capsule Take 60 mg by mouth daily.  1  . diltiazem (CARDIZEM CD) 300 MG 24 hr capsule Take 1 capsule by  mouth daily.  2  . dronedarone (MULTAQ) 400 MG tablet Take 1 tablet (400 mg total) by mouth 2 (two) times daily with a meal. 60 tablet 6  . fluticasone (FLONASE) 50 MCG/ACT nasal spray Two sprays in each nostril twice a day for 5 days then reduce to once a day two sprays each nostril 16 g 1  . hydrALAZINE (APRESOLINE) 100 MG tablet Take 1 tablet (100 mg total) by mouth 3 (three) times daily. 270 tablet 3  . ketotifen (ZADITOR) 0.025 % ophthalmic solution Place 1 drop into both eyes 2 (two) times daily. 10 mL 1  . Olopatadine HCl  (PAZEO) 0.7 % SOLN Place 1 drop into both eyes daily. 1 Bottle 5  . OXcarbazepine (TRILEPTAL) 150 MG tablet Take 1 tablet (150 mg total) by mouth 2 (two) times daily. 60 tablet 2  . Oxycodone HCl 10 MG TABS Take 1 tablet (10 mg total) by mouth 3 (three) times daily.  0  . prazosin (MINIPRESS) 1 MG capsule TAKE 1 CAPSULE BY MOUTH EVERYDAY AT BEDTIME  2  . pregabalin (LYRICA) 100 MG capsule Take 1 capsule (100 mg total) by mouth 2 (two) times daily.    Marland Kitchen tiZANidine (ZANAFLEX) 4 MG tablet Take 1 tablet (4 mg total) by mouth every 8 (eight) hours as needed for muscle spasms. 90 tablet 2  . triamcinolone ointment (KENALOG) 0.5 % Apply 1 application topically 2 (two) times daily. 30 g 0  . carvedilol (COREG) 12.5 MG tablet Take 1 tablet (12.5 mg total) by mouth 2 (two) times daily. 60 tablet 3  . ELIQUIS 5 MG TABS tablet TAKE 1 TABLET (5 MG TOTAL) BY MOUTH 2 (TWO) TIMES DAILY. 60 tablet 5  . omeprazole (PRILOSEC) 20 MG capsule Take 1 capsule (20 mg total) by mouth daily. 30 capsule 3   No facility-administered medications prior to visit.     ROS Review of Systems  Constitutional: Negative for activity change and appetite change.  HENT: Negative for sinus pressure and sore throat.   Eyes: Negative for visual disturbance.  Respiratory: Positive for cough. Negative for chest tightness and shortness of breath.   Cardiovascular: Negative for chest pain and leg swelling.  Gastrointestinal: Negative for abdominal distention, abdominal pain, constipation and diarrhea.  Endocrine: Negative.   Genitourinary: Negative for dysuria.  Musculoskeletal: Negative for joint swelling and myalgias.  Skin: Negative for rash.  Allergic/Immunologic: Negative.   Neurological: Negative for weakness, light-headedness and numbness.  Psychiatric/Behavioral: Negative for dysphoric mood and suicidal ideas.    Objective:  BP 134/83   Pulse 83   Temp 97.7 F (36.5 C) (Oral)   Ht 6\' 1"  (1.854 m)   Wt 252 lb 3.2 oz  (114.4 kg)   SpO2 99%   BMI 33.27 kg/m   BP/Weight 08/14/2018 7/78/2423 11/18/6142  Systolic BP 315 400 867  Diastolic BP 86 83 90  Wt. (Lbs) 257 252.2 254  BMI 33.91 33.27 33.51      Physical Exam Constitutional:      Appearance: He is well-developed.  Cardiovascular:     Rate and Rhythm: Normal rate.     Heart sounds: Normal heart sounds. No murmur.  Pulmonary:     Effort: Pulmonary effort is normal.     Breath sounds: Normal breath sounds. No wheezing or rales.  Chest:     Chest wall: No tenderness.  Abdominal:     General: Bowel sounds are normal. There is no distension.     Palpations: Abdomen is soft.  There is no mass.     Tenderness: There is no abdominal tenderness.  Musculoskeletal: Normal range of motion.  Neurological:     Mental Status: He is alert and oriented to person, place, and time.      CMP Latest Ref Rng & Units 05/07/2018 01/31/2018 08/29/2017  Glucose 65 - 99 mg/dL 100(H) 90 114(H)  BUN 6 - 24 mg/dL 10 11 12   Creatinine 0.76 - 1.27 mg/dL 1.31(H) 1.13 1.26  Sodium 134 - 144 mmol/L 141 138 140  Potassium 3.5 - 5.2 mmol/L 4.1 3.8 3.9  Chloride 96 - 106 mmol/L 104 103 102  CO2 20 - 29 mmol/L 23 22 22   Calcium 8.7 - 10.2 mg/dL 9.7 9.3 9.7  Total Protein 6.0 - 8.5 g/dL 7.4 7.1 7.5  Total Bilirubin 0.0 - 1.2 mg/dL 0.2 0.3 0.2  Alkaline Phos 39 - 117 IU/L 82 82 82  AST 0 - 40 IU/L 14 15 16   ALT 0 - 44 IU/L 17 19 16     Lipid Panel     Component Value Date/Time   CHOL 171 05/07/2018 1004   TRIG 204 (H) 05/07/2018 1004   HDL 33 (L) 05/07/2018 1004   CHOLHDL 5.2 (H) 05/07/2018 1004   CHOLHDL 5.4 10/17/2013 1042   VLDL 36 10/17/2013 1042   LDLCALC 97 05/07/2018 1004     Assessment & Plan:   1. Other schizophrenia ([place]) Managed by Psych  2. Paroxysmal atrial fibrillation ([facility]) Rate control with Carvedilol and anticoagulation with Eliquis - Comprehensive metabolic panel - apixaban (ELIQUIS) 5 MG TABS tablet; Take 1 tablet (5 mg total) by  mouth 2 (two) times daily.  Dispense: 60 tablet; Refill: 5  3. Allergic rhinitis caused by mold Uncontrolled, maximized therapy with regards to management Discussed saline nasal rinses Currently receiving immunotheray  4. Screening for colon cancer - Ambulatory referral to Gastroenterology  5. Chronic left-sided low back pain with right-sided sciatica As per pain management  6. Essential hypertension Controlled Counseled on blood pressure goal of less than 130/80, low-sodium, DASH diet, medication compliance, 150 minutes of moderate intensity exercise per week. Discussed medication compliance, adverse effects. - carvedilol (COREG) 12.5 MG tablet; Take 1 tablet (12.5 mg total) by mouth 2 (two) times daily.  Dispense: 60 tablet; Refill: 3   Meds ordered this encounter  Medications  . carvedilol (COREG) 12.5 MG tablet    Sig: Take 1 tablet (12.5 mg total) by mouth 2 (two) times daily.    Dispense:  60 tablet    Refill:  3  . omeprazole (PRILOSEC) 20 MG capsule    Sig: Take 1 capsule (20 mg total) by mouth daily.    Dispense:  30 capsule    Refill:  3  . apixaban (ELIQUIS) 5 MG TABS tablet    Sig: Take 1 tablet (5 mg total) by mouth 2 (two) times daily.    Dispense:  60 tablet    Refill:  5    Follow-up: Return in about 6 months (around 02/12/2019) for follow up of chronic medical conditions.   Charlott Rakes MD

## 2018-08-15 ENCOUNTER — Encounter: Payer: Self-pay | Admitting: Family Medicine

## 2018-08-15 LAB — COMPREHENSIVE METABOLIC PANEL
ALT: 22 IU/L (ref 0–44)
AST: 16 IU/L (ref 0–40)
Albumin/Globulin Ratio: 1.5 (ref 1.2–2.2)
Albumin: 4.2 g/dL (ref 3.8–4.9)
Alkaline Phosphatase: 76 IU/L (ref 39–117)
BUN/Creatinine Ratio: 10 (ref 9–20)
BUN: 13 mg/dL (ref 6–24)
Bilirubin Total: 0.3 mg/dL (ref 0.0–1.2)
CO2: 22 mmol/L (ref 20–29)
Calcium: 9.6 mg/dL (ref 8.7–10.2)
Chloride: 104 mmol/L (ref 96–106)
Creatinine, Ser: 1.32 mg/dL — ABNORMAL HIGH (ref 0.76–1.27)
GFR calc Af Amer: 69 mL/min/{1.73_m2} (ref >59)
GFR calc non Af Amer: 59 mL/min/{1.73_m2} — ABNORMAL LOW (ref >59)
Globulin, Total: 2.8 g/dL (ref 1.5–4.5)
Glucose: 111 mg/dL — ABNORMAL HIGH (ref 65–99)
Potassium: 3.8 mmol/L (ref 3.5–5.2)
Sodium: 142 mmol/L (ref 134–144)
Total Protein: 7 g/dL (ref 6.0–8.5)

## 2018-08-17 ENCOUNTER — Ambulatory Visit (INDEPENDENT_AMBULATORY_CARE_PROVIDER_SITE_OTHER): Payer: Medicaid Other | Admitting: *Deleted

## 2018-08-17 DIAGNOSIS — J309 Allergic rhinitis, unspecified: Secondary | ICD-10-CM | POA: Diagnosis not present

## 2018-08-19 ENCOUNTER — Other Ambulatory Visit: Payer: Self-pay | Admitting: Family Medicine

## 2018-08-19 DIAGNOSIS — J302 Other seasonal allergic rhinitis: Secondary | ICD-10-CM

## 2018-08-20 ENCOUNTER — Telehealth: Payer: Self-pay

## 2018-08-20 NOTE — Telephone Encounter (Signed)
-----   Message from Charlott Rakes, MD sent at 08/15/2018  6:22 AM EST ----- Labs are stable

## 2018-08-20 NOTE — Telephone Encounter (Signed)
Patient was called and informed of lab results. 

## 2018-08-24 ENCOUNTER — Ambulatory Visit (INDEPENDENT_AMBULATORY_CARE_PROVIDER_SITE_OTHER): Payer: Medicaid Other

## 2018-08-24 DIAGNOSIS — J309 Allergic rhinitis, unspecified: Secondary | ICD-10-CM

## 2018-08-31 ENCOUNTER — Ambulatory Visit (INDEPENDENT_AMBULATORY_CARE_PROVIDER_SITE_OTHER): Payer: Medicaid Other | Admitting: *Deleted

## 2018-08-31 DIAGNOSIS — J309 Allergic rhinitis, unspecified: Secondary | ICD-10-CM | POA: Diagnosis not present

## 2018-09-05 DIAGNOSIS — J301 Allergic rhinitis due to pollen: Secondary | ICD-10-CM | POA: Diagnosis not present

## 2018-09-05 NOTE — Progress Notes (Signed)
VIALS EXP 09-06-2019

## 2018-09-21 ENCOUNTER — Ambulatory Visit (INDEPENDENT_AMBULATORY_CARE_PROVIDER_SITE_OTHER): Payer: Medicaid Other | Admitting: *Deleted

## 2018-09-21 DIAGNOSIS — J309 Allergic rhinitis, unspecified: Secondary | ICD-10-CM

## 2018-09-24 NOTE — Progress Notes (Signed)
Exp 09/25/19

## 2018-09-25 DIAGNOSIS — J301 Allergic rhinitis due to pollen: Secondary | ICD-10-CM

## 2018-09-27 ENCOUNTER — Encounter: Payer: Self-pay | Admitting: Physical Therapy

## 2018-09-27 ENCOUNTER — Ambulatory Visit: Payer: Medicaid Other | Attending: Neurology | Admitting: Physical Therapy

## 2018-09-27 DIAGNOSIS — M542 Cervicalgia: Secondary | ICD-10-CM | POA: Diagnosis present

## 2018-09-27 NOTE — Therapy (Signed)
[facility] [address] [place], [place], [address] Phone: [phone]   Fax:  [phone]  Physical Therapy Evaluation  Patient Details  Name: Jason Stokes MRN: [id number] Date of Birth: 1961-07-03 Referring Provider (PT): Kathrynn Ducking, MD   Encounter Date: 09/27/2018  PT End of Session - 09/27/18 1132    Visit Number  1    Number of Visits  12    Date for PT Re-Evaluation  11/22/18    Authorization Type  MCD     PT Start Time  0845    PT Stop Time  0930    PT Time Calculation (min)  45 min    Activity Tolerance  Patient tolerated treatment well    Behavior During Therapy  [facility] for tasks assessed/performed       Past Medical History:  Diagnosis Date  . Anxiety   . Bilateral carpal tunnel syndrome 01/10/2018  . Bipolar disorder ([place])   . Chronic lower back pain   . Depression   . Dysrhythmia    a-fib  . GERD (gastroesophageal reflux disease)   . History of alcohol abuse   . History of nuclear stress test    Myoview 10/16: EF 50%, diaphragmatic attenuation, no ischemia, low risk  . Hypertension   . Migraine 2012-2014  . Moderate persistent asthma with acute exacerbation 05/02/2018  . PAF (paroxysmal atrial fibrillation) ([place]) 03/27/2015   a. Myoview neg for ischemia >> Flecainide started 10/16 >> FU ETT   . Schizophrenia ([place])   . Sciatica neuralgia   . Small vessel disease ([place])    Right basal ganglia stroke  . Stroke [facility]) "between 2012-2014"   residual "AF" (09/22/2015)    Past Surgical History:  Procedure Laterality Date  . ATRIAL FIBRILLATION ABLATION  09/22/2015  . CYST EXCISION  1996-97   surgery back of head   . ELECTROPHYSIOLOGIC STUDY N/A 09/22/2015   Procedure: Atrial Fibrillation Ablation;  Surgeon: Will [name], MD;  Location: [place];  Service: Cardiovascular;  Laterality: N/A;  . ELECTROPHYSIOLOGIC STUDY N/A 12/10/2015   Procedure: Atrial Fibrillation Ablation;   Surgeon: Will [name], MD;  Location: [place] [facility];  Service: Cardiovascular;  Laterality: N/A;  . ELECTROPHYSIOLOGIC STUDY N/A 12/11/2015   Procedure: Cardioversion;  Surgeon: Will [name], MD;  Location: [place] [facility];  Service: Cardiovascular;  Laterality: N/A;  . EXCISION MASS HEAD N/A 01/06/2017   Procedure: EXCISION MASS FOREHEAD;  Surgeon: Irene Limbo, MD;  Location: [place];  Service: Plastics;  Laterality: N/A;  . GANGLION CYST EXCISION Left   . INTERCOSTAL NERVE BLOCK  2005  . KNEE ARTHROSCOPY Right 2016    There were no vitals filed for this visit.   Subjective Assessment - 09/27/18 1111    Subjective  Pt relays he has had chronic back pain since 2000 and is going to pain managment clinic for this. He says in Nov 2019 his back spasms so much that he turned his body too quickly and pain went up into his neck. He has had severe but intermittent neck pain since. He says when the pain is there he has radiculopathy, but if no pain he does not have radiculopathy.     Pertinent History  PMH: chronic LBP, bilat CTS, anx, bipolar,dep, a-fib, GERD, alcohol abuse, HTN,migranes, asthma,schizophrenia,sciatica, CVA    Limitations  Sitting;Reading;Lifting;Standing;Walking    Diagnostic tests  Abnormal MRI scan of cervical spine showing prominent spondylitic change at  C3-4 with moderate left sided foraminal narrowing. C4-5 and C5-6 also shows similar but milder changes. Compared with previous MRI from 10/08/2014 these changes appear to have progressed    Patient Stated Goals  get the pain down and move my neck better    Currently in Pain?  Yes    Pain Score  9     Pain Location  Neck    Pain Orientation  Right    Pain Descriptors / Indicators  Aching;Tightness;Shooting    Pain Type  Chronic pain    Pain Radiating Towards  intermittent bilat UE radiculopathy however he does have CTS so unclear if this true radiculopathy, no present radiculopathy today     Pain Onset  More than a month ago    Pain Frequency  Intermittent    Aggravating Factors   any head movements    Pain Relieving Factors  pain meds, sometimes neck stretching    Multiple Pain Sites  Yes   chronic LBP seeing pain managment for this        [facility] PT Assessment - 09/27/18 0001      Assessment   Medical Diagnosis  Neck pain, does have bilat N/T in hands but also has CTS    Referring Provider (PT)  Kathrynn Ducking, MD    Onset Date/Surgical Date  --   onset of pain Nov 2019   Next MD Visit  not scheduled    Prior Therapy  PT in past for back pain      Precautions   Precautions  None      Restrictions   Weight Bearing Restrictions  No      Balance Screen   Has the patient fallen in the past 6 months  No      [place] residence      Prior Function   Level of Independence  Independent with basic ADLs    Vocation  On disability    Leisure  watch grandkids and sports      Sensation   Light Touch  Appears Intact      Coordination   Gross Motor Movements are Fluid and Coordinated  Yes      Posture/Postural Control   Posture Comments  mild slumped posture with rounded shoulders, fwd head and T kyphosis      ROM / Strength   AROM / PROM / Strength  AROM;Strength;PROM      AROM   Overall AROM Comments  shoulders WNL    AROM Assessment Site  Cervical    Cervical Flexion  30    Cervical Extension  15    Cervical - Right Side Bend  12    Cervical - Left Side Bend  10    Cervical - Right Rotation  22    Cervical - Left Rotation  20      PROM   Overall PROM Comments  neck PROM WFL with only mild defecits so suggests muscular restriction in AROM      Strength   Overall Strength Comments  UE strength WNL bilat      Flexibility   Soft Tissue Assessment /Muscle Length  --   tightness in cerv P.S, traps     Palpation   Spinal mobility  mild decrease in C-T spine mobility    Palpation comment  TTP C3-4 and Cerv P.S       Special Tests   Other special tests  negative spurlings for radiculopathy,  did relay some relief in pain with distraction so unclear if true radiculopathy as he had no N/T in UE or hands today and he also has CTS      Transfers   Transfers  Independent with all Transfers      Ambulation/Gait   Gait Comments  uses SPC for gait, WFL pattern but decreased velocity                Objective measurements completed on examination: See above findings.      [facility] Adult PT Treatment/Exercise - 09/27/18 0001      Modalities   Modalities  Electrical Stimulation;Moist Heat      Moist Heat Therapy   Number Minutes Moist Heat  10 Minutes    Moist Heat Location  Cervical      Electrical Stimulation   Electrical Stimulation Location  neck    Electrical Stimulation Action  IFC    Electrical Stimulation Parameters  tolerance, 10 min    Electrical Stimulation Goals  Tone;Pain      Manual Therapy   Manual therapy comments  had some relief with cervical distraction today             PT Education - 09/27/18 1131    Education Details  HEP, POC, pain science education and need for gentle ROM in pain free ranges, motion is lotion and pain does not equal harm    Person(s) Educated  Patient    Methods  Explanation;Demonstration;Verbal cues;Handout    Comprehension  Verbalized understanding;Need further instruction       PT Short Term Goals - 09/27/18 1148      PT SHORT TERM GOAL #1   Title  Pt will be I and compliant with HEP. 4 weeks 10/25/18    Baseline  no HEP until today      PT SHORT TERM GOAL #2   Title  Pt will relay at least 2 new ways to manage his neck pain at home including stretching, heat/ice, TENS, gentle motion, relaxation techniques. 4 weeks 10/25/18    Baseline  only knows to take pain meds     Status  New        PT Long Term Goals - 09/27/18 1150      PT LONG TERM GOAL #1   Title  Pt will improve neck ROM to [facility] (at least 75% ROM). 8 weeks 11/22/18     Baseline  less than 25% neck ROM.     Status  New      PT LONG TERM GOAL #2   Title  Pt will be able to perform his usual ADL's without increasing increment of pain 20%. 8 weeks 11/22/18    Baseline  9/10 pain    Status  New      PT LONG TERM GOAL #3   Title  Pt will relay improved sleep quality due to less overall neck pain. 8 weeks 11/22/18    Baseline  not sleeping well due to neck pain and discomfort    Status  New             Plan - 09/27/18 1134    Clinical Impression Statement  Pt presents with neck pain and muscle spasm along with spondylitic change C3-4 and Lt foraminal narrowing seen on neck MRI. It is unclear if he has radiculopathy at this point as he did not have any N/T today and no special testing caused this. He does however have intermittent N/T in hand but  this may due to his CTS. He has decreased overall neck ROM and mobility, increased muscle tightness and spasm and increased pain limiting his functional abilities causing him to rotate or sidebend his neck. He will benefit from skilled PT to address his defecits.     Personal Factors and Comorbidities  Comorbidity 1;Comorbidity 2;Past/Current Experience;Fitness    Comorbidities  PMH: chronic LBP, bilat CTS, anx, bipolar,dep, a-fib, GERD, alcohol abuse, HTN,migranes, asthma,schizophrenia,sciatica, CVA    Examination-Activity Limitations  Hygiene/Grooming;Reach Overhead    Examination-Participation Restrictions  Driving;Meal Prep;Cleaning;Community Activity    Stability/Clinical Decision Making  Evolving/Moderate complexity    Clinical Decision Making  Moderate    Rehab Potential  Fair    PT Frequency  Other (comment)   1-2   PT Duration  8 weeks    PT Treatment/Interventions  Cryotherapy;Electrical Stimulation;Iontophoresis 4mg /ml Dexamethasone;Moist Heat;Traction;Ultrasound;Therapeutic activities;Therapeutic exercise;Neuromuscular re-education;Manual techniques;Passive range of motion;Dry needling;Taping;Spinal  Manipulations;Joint Manipulations    PT Next Visit Plan  review HEP and begin neck ROM and stretching, consider MT for STM, neck mobs and traction, consider modalaties for pain    PT Home Exercise Plan  [id number], UT and levator stretch, doorway stretch, cerv SNAG ext and rotation with towel, chin tucks, rows and ext with green band    Recommended Other Services  more pain science education    Consulted and Agree with Plan of Care  Patient       Patient will benefit from skilled therapeutic intervention in order to improve the following deficits and impairments:  Decreased activity tolerance, Decreased endurance, Decreased range of motion, Decreased strength, Hypomobility, Increased muscle spasms, Increased fascial restricitons, Pain, Postural dysfunction  Visit Diagnosis: Cervicalgia     Problem List Patient Active Problem List   Diagnosis Date Noted  . Musculoskeletal chest pain 07/24/2018  . Asthma, mild intermittent 05/02/2018  . Allergic rhinitis caused by mold 05/02/2018  . Tobacco use 05/02/2018  . Bilateral carpal tunnel syndrome 01/10/2018  . Stroke ([place])   . Schizophrenia ([place])   . Migraine   . History of nuclear stress test   . History of alcohol abuse   . GERD (gastroesophageal reflux disease)   . Dysrhythmia   . Chronic lower back pain   . Anxiety   . Arthritis of knee, right 04/12/2017  . Bipolar disorder ([place]) 04/03/2017  . Lipoma of forehead 09/22/2016  . Trigger ring finger of right hand 06/22/2016  . Paroxysmal atrial fibrillation ([facility])   . AF (atrial fibrillation) ([place]) 12/10/2015  . Eczema 07/10/2015  . History of CVA (cerebrovascular accident) 05/04/2015  . Tear of medial meniscus of right knee 03/18/2015  . Chronic pain of right knee 03/12/2015  . Other and unspecified hyperlipidemia 11/25/2013  . Nasal congestion 11/25/2013  . Sinusitis, chronic 05/09/2013  . Chronic low back pain 10/12/2012  . Lumbar radiculopathy 10/12/2012  . Hypertension  10/12/2012  . Depression 10/12/2012  . Insomnia 10/12/2012    Silvestre Mesi 09/27/2018, 11:58 AM  [facility] [address] [place], [place], [address] Phone: [phone]   Fax:  [phone]  Name: Jason Stokes MRN: [id number] Date of Birth: [birth year]

## 2018-09-28 ENCOUNTER — Ambulatory Visit (INDEPENDENT_AMBULATORY_CARE_PROVIDER_SITE_OTHER): Payer: Medicaid Other

## 2018-09-28 DIAGNOSIS — J309 Allergic rhinitis, unspecified: Secondary | ICD-10-CM

## 2018-10-05 ENCOUNTER — Ambulatory Visit (INDEPENDENT_AMBULATORY_CARE_PROVIDER_SITE_OTHER): Payer: Medicaid Other | Admitting: *Deleted

## 2018-10-05 ENCOUNTER — Ambulatory Visit: Payer: Medicaid Other | Admitting: Cardiology

## 2018-10-05 DIAGNOSIS — J309 Allergic rhinitis, unspecified: Secondary | ICD-10-CM

## 2018-10-08 ENCOUNTER — Telehealth: Payer: Self-pay | Admitting: Physical Therapy

## 2018-10-08 ENCOUNTER — Ambulatory Visit: Payer: Medicaid Other | Admitting: Physical Therapy

## 2018-10-08 NOTE — Telephone Encounter (Signed)
Alecsander Hattabaugh was contacted today regarding the temporary closing of [facility] due to Covid-19.  Therapist introduced self as he was evaluated by another therapist did his evaluation. He reports use of heat at home has helped. He reports the exercises are not helping much. PTA encouraged him to keep trying them and if pain increases to stop them. Pt also reports he needs electrodes for his home TENS unit. Advised him we would address that on his return visit or he can check on [facility] to see if they have them. Pt verbalized understanding. Advised pt that if our schedules are to change further our front office will be in contact with him. He verbalized understanding of this as well.    [facility] will follow up with patients when we are able to resume care.  Willow Ora, PTA, [facility] [address], [place], [place] [address] [phone] 10/08/18, 3:10 PM  [facility] [address] [place], [place]  [address] Phone:  [phone] Fax:  [phone] \

## 2018-10-11 ENCOUNTER — Other Ambulatory Visit: Payer: Self-pay

## 2018-10-11 ENCOUNTER — Ambulatory Visit: Payer: Medicaid Other | Admitting: Physical Therapy

## 2018-10-11 ENCOUNTER — Encounter: Payer: Self-pay | Admitting: Physical Therapy

## 2018-10-11 DIAGNOSIS — M542 Cervicalgia: Secondary | ICD-10-CM | POA: Diagnosis not present

## 2018-10-11 NOTE — Therapy (Signed)
[facility] [address] [place], [place], [address] Phone: [phone]   Fax:  [phone]  Physical Therapy Treatment  Patient Details  Name: SKIPPER DACOSTA MRN: [id number] Date of Birth: 1961/01/09 Referring Provider (PT): Kathrynn Ducking, MD   Encounter Date: 10/11/2018  PT End of Session - 10/11/18 1207    Visit Number  2    Number of Visits  12    Date for PT Re-Evaluation  11/22/18    Authorization Type  MCD -approved 3 visits 10/08/2018-10/28/2018    Authorization - Visit Number  1    Authorization - Number of Visits  3    PT Start Time  1037    PT Stop Time  1125    PT Time Calculation (min)  48 min    Activity Tolerance  Patient tolerated treatment well   Pt reports decreased pain to 6/10 at end of session   Behavior During Therapy  [facility] for tasks assessed/performed       Past Medical History:  Diagnosis Date  . Anxiety   . Bilateral carpal tunnel syndrome 01/10/2018  . Bipolar disorder ([place])   . Chronic lower back pain   . Depression   . Dysrhythmia    a-fib  . GERD (gastroesophageal reflux disease)   . History of alcohol abuse   . History of nuclear stress test    Myoview 10/16: EF 50%, diaphragmatic attenuation, no ischemia, low risk  . Hypertension   . Migraine 2012-2014  . Moderate persistent asthma with acute exacerbation 05/02/2018  . PAF (paroxysmal atrial fibrillation) ([place]) 03/27/2015   a. Myoview neg for ischemia >> Flecainide started 10/16 >> FU ETT   . Schizophrenia ([place])   . Sciatica neuralgia   . Small vessel disease ([place])    Right basal ganglia stroke  . Stroke [facility]) "between 2012-2014"   residual "AF" (09/22/2015)    Past Surgical History:  Procedure Laterality Date  . ATRIAL FIBRILLATION ABLATION  09/22/2015  . CYST EXCISION  1996-97   surgery back of head   . ELECTROPHYSIOLOGIC STUDY N/A 09/22/2015   Procedure: Atrial Fibrillation Ablation;  Surgeon: Will [name], MD;   Location: [facility];  Service: Cardiovascular;  Laterality: N/A;  . ELECTROPHYSIOLOGIC STUDY N/A 12/10/2015   Procedure: Atrial Fibrillation Ablation;  Surgeon: Will [name], MD;  Location: [facility];  Service: Cardiovascular;  Laterality: N/A;  . ELECTROPHYSIOLOGIC STUDY N/A 12/11/2015   Procedure: Cardioversion;  Surgeon: Will [name], MD;  Location: [place] [facility];  Service: Cardiovascular;  Laterality: N/A;  . EXCISION MASS HEAD N/A 01/06/2017   Procedure: EXCISION MASS FOREHEAD;  Surgeon: Irene Limbo, MD;  Location: [facility];  Service: Plastics;  Laterality: N/A;  . GANGLION CYST EXCISION Left   . INTERCOSTAL NERVE BLOCK  2005  . KNEE ARTHROSCOPY Right 2016    There were no vitals filed for this visit.  Subjective Assessment - 10/11/18 1041    Subjective  I'd be doing better if I could get this pain to go away. Maybe note a little improvement with the new exercises.  (Pended)     Pertinent History  PMH: chronic LBP, bilat CTS, anx, bipolar,dep, a-fib, GERD, alcohol abuse, HTN,migranes, asthma,schizophrenia,sciatica, CVA  (Pended)     Limitations  Sitting;Reading;Lifting;Standing;Walking  (Pended)     Diagnostic tests  Abnormal MRI scan of cervical spine showing prominent spondylitic change at C3-4 with moderate left sided foraminal narrowing. C4-5 and  C5-6 also shows similar but milder changes. Compared with previous MRI from 10/08/2014 these changes appear to have progressed  (Pended)     Patient Stated Goals  get the pain down and move my neck better  (Pended)     Currently in Pain?  Yes  (Pended)     Pain Score  9   (Pended)     Pain Location  Neck  (Pended)     Pain Orientation  Right;Left  (Pended)     Pain Descriptors / Indicators  --  (Pended)    pulling   Pain Type  Chronic pain  (Pended)     Pain Onset  More than a month ago  (Pended)     Pain Frequency  Intermittent  (Pended)     Aggravating Factors   unsure, head  movements, quick movements in bed  (Pended)          [facility] PT Assessment - 10/11/18 0001      AROM   AROM Assessment Site  Cervical    Cervical Flexion  21    Cervical Extension  11    Cervical - Right Rotation  32                   [facility] Adult PT Treatment/Exercise - 10/11/18 0001      Exercises   Exercises  Neck;Shoulder      Neck Exercises: Theraband   Other Theraband Exercises  Review of HEP (without theraband, but checking technique)-shoulder rows and shoulder extension, x 5 reps each (pt demo good form)      Neck Exercises: Seated   Neck Retraction  10 reps   towel  behind neck at wall   Cervical Rotation  10 reps;Right;Left   stretch with towel-cues for technique   Shoulder Rolls  Forwards;10 reps    Other Seated Exercise  Seated neck extension with towel, 5 reps -review of HEP    Other Seated Exercise  Educated in gentle active neck ROM rotation x 5 reps, neck flexion/extension x 5 reps      Shoulder Exercises: Stretch   Corner Stretch  3 reps;30 seconds   2x at corner, once in doorway-review of HEP     Manual Therapy   Manual Therapy  Passive ROM   Passive stretch to upper traps; all for pain relief   Manual therapy comments  gentle manual distraction cervical spine, stretching performed to upper traps bilateral    Passive ROM  Passive stretching with attention paid to occiput and along upper traps for stretch             PT Education - 10/11/18 1207    Education Details  REview of HEP, review of importance of gentle ROM in pain free ranges throughout the day to avoid stiffness    Person(s) Educated  Patient    Methods  Explanation;Demonstration;Verbal cues    Comprehension  Verbalized understanding;Returned demonstration;Verbal cues required       PT Short Term Goals - 09/27/18 1148      PT SHORT TERM GOAL #1   Title  Pt will be I and compliant with HEP. 4 weeks 10/25/18    Baseline  no HEP until today      PT SHORT TERM GOAL #2    Title  Pt will relay at least 2 new ways to manage his neck pain at home including stretching, heat/ice, TENS, gentle motion, relaxation techniques. 4 weeks 10/25/18    Baseline  only knows  to take pain meds     Status  New        PT Long Term Goals - 09/27/18 1150      PT LONG TERM GOAL #1   Title  Pt will improve neck ROM to [facility] (at least 75% ROM). 8 weeks 11/22/18    Baseline  less than 25% neck ROM.     Status  New      PT LONG TERM GOAL #2   Title  Pt will be able to perform his usual ADL's without increasing increment of pain 20%. 8 weeks 11/22/18    Baseline  9/10 pain    Status  New      PT LONG TERM GOAL #3   Title  Pt will relay improved sleep quality due to less overall neck pain. 8 weeks 11/22/18    Baseline  not sleeping well due to neck pain and discomfort    Status  New            Plan - 10/11/18 1208    Clinical Impression Statement  Skilled PT session today with review of HEP from last visit, manual therapy and stretching to neck musculature, upper traps and occipital area.  Pt needs some cues for correct technique of shoulder exercises and to relax shoulders throughout seated neck exercises.  Pt does report imporvement in pain from 9/10 to 6/10 at end of PT session.  Pt will continue to beneift from skilled PT to address pain, neck flexibility limitations.    Personal Factors and Comorbidities  Comorbidity 1;Comorbidity 2;Past/Current Experience;Fitness    Comorbidities  PMH: chronic LBP, bilat CTS, anx, bipolar,dep, a-fib, GERD, alcohol abuse, HTN,migranes, asthma,schizophrenia,sciatica, CVA    Examination-Activity Limitations  Hygiene/Grooming;Reach Overhead    Examination-Participation Restrictions  Driving;Meal Prep;Cleaning;Community Activity    Stability/Clinical Decision Making  Evolving/Moderate complexity    Rehab Potential  Fair    PT Frequency  Other (comment)   1-2   PT Duration  8 weeks    PT Treatment/Interventions  Cryotherapy;Electrical  Stimulation;Iontophoresis 4mg /ml Dexamethasone;Moist Heat;Traction;Ultrasound;Therapeutic activities;Therapeutic exercise;Neuromuscular re-education;Manual techniques;Passive range of motion;Dry needling;Taping;Spinal Manipulations;Joint Manipulations    PT Next Visit Plan  Continue neck ROM, stretching, passive overpressures for home; consider MT for STM, neck mobs and traction, modalities for pain    PT Home Exercise Plan  [id number], UT and levator stretch, doorway stretch, cerv SNAG ext and rotation with towel, chin tucks, rows and ext with green band    Consulted and Agree with Plan of Care  Patient       Patient will benefit from skilled therapeutic intervention in order to improve the following deficits and impairments:  Decreased activity tolerance, Decreased endurance, Decreased range of motion, Decreased strength, Hypomobility, Increased muscle spasms, Increased fascial restricitons, Pain, Postural dysfunction  Visit Diagnosis: Cervicalgia     Problem List Patient Active Problem List   Diagnosis Date Noted  . Musculoskeletal chest pain 07/24/2018  . Asthma, mild intermittent 05/02/2018  . Allergic rhinitis caused by mold 05/02/2018  . Tobacco use 05/02/2018  . Bilateral carpal tunnel syndrome 01/10/2018  . Stroke ([place])   . Schizophrenia ([place])   . Migraine   . History of nuclear stress test   . History of alcohol abuse   . GERD (gastroesophageal reflux disease)   . Dysrhythmia   . Chronic lower back pain   . Anxiety   . Arthritis of knee, right 04/12/2017  . Bipolar disorder ([place]) 04/03/2017  . Lipoma of forehead 09/22/2016  . Trigger  ring finger of right hand 06/22/2016  . Paroxysmal atrial fibrillation ([facility])   . AF (atrial fibrillation) ([place]) 12/10/2015  . Eczema 07/10/2015  . History of CVA (cerebrovascular accident) 05/04/2015  . Tear of medial meniscus of right knee 03/18/2015  . Chronic pain of right knee 03/12/2015  . Other and unspecified hyperlipidemia  11/25/2013  . Nasal congestion 11/25/2013  . Sinusitis, chronic 05/09/2013  . Chronic low back pain 10/12/2012  . Lumbar radiculopathy 10/12/2012  . Hypertension 10/12/2012  . Depression 10/12/2012  . Insomnia 10/12/2012    Laray Corbit W. 10/11/2018, 12:11 PM Frazier Butt., PT  [facility] [address] [place], [place], [address] Phone: [phone]   Fax:  [phone]  Name: NATALIE LECLAIRE MRN: [id number] Date of Birth: 1961/05/11

## 2018-10-12 ENCOUNTER — Ambulatory Visit (INDEPENDENT_AMBULATORY_CARE_PROVIDER_SITE_OTHER): Payer: Medicaid Other | Admitting: *Deleted

## 2018-10-12 DIAGNOSIS — J309 Allergic rhinitis, unspecified: Secondary | ICD-10-CM | POA: Diagnosis not present

## 2018-10-16 ENCOUNTER — Other Ambulatory Visit: Payer: Self-pay

## 2018-10-16 ENCOUNTER — Ambulatory Visit: Payer: Medicaid Other | Admitting: Physical Therapy

## 2018-10-16 ENCOUNTER — Encounter: Payer: Self-pay | Admitting: Physical Therapy

## 2018-10-16 DIAGNOSIS — M542 Cervicalgia: Secondary | ICD-10-CM | POA: Diagnosis not present

## 2018-10-16 NOTE — Therapy (Signed)
[facility] [address] [place], [place], [address] Phone: [phone]   Fax:  [phone]  Physical Therapy Treatment  Patient Details  Name: Jason Stokes MRN: [id number] Date of Birth: 01/06/1961 Referring Provider (PT): Kathrynn Ducking, MD   Encounter Date: 10/16/2018  PT End of Session - 10/16/18 1053    Visit Number  3    Number of Visits  12    Date for PT Re-Evaluation  11/22/18    Authorization Type  MCD -approved 3 visits 10/08/2018-10/28/2018    Authorization - Visit Number  2    Authorization - Number of Visits  3    PT Start Time  1030   pt arrives late   PT Stop Time  1123    PT Time Calculation (min)  53 min    Activity Tolerance  Patient tolerated treatment well   Pt reports decreased pain to 6/10 at end of session   Behavior During Therapy  [facility] for tasks assessed/performed       Past Medical History:  Diagnosis Date  . Anxiety   . Bilateral carpal tunnel syndrome 01/10/2018  . Bipolar disorder ([place])   . Chronic lower back pain   . Depression   . Dysrhythmia    a-fib  . GERD (gastroesophageal reflux disease)   . History of alcohol abuse   . History of nuclear stress test    Myoview 10/16: EF 50%, diaphragmatic attenuation, no ischemia, low risk  . Hypertension   . Migraine 2012-2014  . Moderate persistent asthma with acute exacerbation 05/02/2018  . PAF (paroxysmal atrial fibrillation) ([place]) 03/27/2015   a. Myoview neg for ischemia >> Flecainide started 10/16 >> FU ETT   . Schizophrenia ([place])   . Sciatica neuralgia   . Small vessel disease ([place])    Right basal ganglia stroke  . Stroke [facility]) "between 2012-2014"   residual "AF" (09/22/2015)    Past Surgical History:  Procedure Laterality Date  . ATRIAL FIBRILLATION ABLATION  09/22/2015  . CYST EXCISION  1996-97   surgery back of head   . ELECTROPHYSIOLOGIC STUDY N/A 09/22/2015   Procedure: Atrial Fibrillation Ablation;  Surgeon: Will  [name], MD;  Location: [facility];  Service: Cardiovascular;  Laterality: N/A;  . ELECTROPHYSIOLOGIC STUDY N/A 12/10/2015   Procedure: Atrial Fibrillation Ablation;  Surgeon: Will [name], MD;  Location: [facility];  Service: Cardiovascular;  Laterality: N/A;  . ELECTROPHYSIOLOGIC STUDY N/A 12/11/2015   Procedure: Cardioversion;  Surgeon: Will [name], MD;  Location: [facility];  Service: Cardiovascular;  Laterality: N/A;  . EXCISION MASS HEAD N/A 01/06/2017   Procedure: EXCISION MASS FOREHEAD;  Surgeon: Irene Limbo, MD;  Location: [facility];  Service: Plastics;  Laterality: N/A;  . GANGLION CYST EXCISION Left   . INTERCOSTAL NERVE BLOCK  2005  . KNEE ARTHROSCOPY Right 2016    There were no vitals filed for this visit.  Subjective Assessment - 10/16/18 1032    Subjective  Feel like fluid is building up in my knee, so I haven't really been able to do my exercises.    Pertinent History  PMH: chronic LBP, bilat CTS, anx, bipolar,dep, a-fib, GERD, alcohol abuse, HTN,migranes, asthma,schizophrenia,sciatica, CVA    Limitations  Sitting;Reading;Lifting;Standing;Walking    Diagnostic tests  Abnormal MRI scan of cervical spine showing prominent spondylitic change at C3-4 with moderate left sided foraminal narrowing. C4-5 and C5-6 also shows similar but milder changes. Compared  with previous MRI from 10/08/2014 these changes appear to have progressed    Patient Stated Goals  get the pain down and move my neck better    Currently in Pain?  Yes    Pain Score  10-Worst pain ever   Worse than 10   Pain Location  Neck    Pain Orientation  Right    Pain Descriptors / Indicators  Aching;Tightness;Shooting    Pain Type  Chronic pain    Pain Onset  More than a month ago    Pain Frequency  Intermittent    Aggravating Factors   any head movements    Pain Relieving Factors  pain meds, sometimes neck stretching                        [facility] Adult PT Treatment/Exercise - 10/16/18 0001      Self-Care   Self-Care  Other Self-Care Comments    Other Self-Care Comments   Pt wants to know how to decrease stiffness after feeling better after therapy, heat/TENS at home; discussed managing pain throughout the day, using heat/TENS, stretches, gentle ROM, self-massage with tennis balls- to avoid pain getting up to or staying all the way at 10/10      Neck Exercises: Seated   Neck Retraction  5 reps    Cervical Rotation  10 reps;Right;Left   with end range overpressures   Other Seated Exercise  Seated neck flexion/extension 10 reps with end range overpressures last rep    Other Seated Exercise  Using tennis ball, massage to mid/low traps, paraspinals against wall      Modalities   Modalities  Electrical Stimulation;Moist Heat      Moist Heat Therapy   Number Minutes Moist Heat  10 Minutes    Moist Heat Location  Cervical      Electrical Stimulation   Electrical Stimulation Location  neck   R and L   Electrical Stimulation Action  IFC    Electrical Stimulation Parameters  10 minutes, to pt's tolerance, increased to 11 V    Electrical Stimulation Goals  Tone;Pain      Manual Therapy   Manual Therapy  Passive ROM;Soft tissue mobilization   in sitting position-cues for upright posture   Manual therapy comments  massage to upper, mid traps, paraspinal area of lower c-spine, for relaxation, decreased stiffness    Soft tissue mobilization  Gentle manual distraction stretch to upper traps, SCM 5 reps each side             PT Education - 10/16/18 1053    Education Details  Updates to HEP    Person(s) Educated  Patient    Methods  Explanation;Demonstration;Verbal cues;Handout    Comprehension  Verbalized understanding;Returned demonstration;Verbal cues required       PT Short Term Goals - 10/16/18 1054      PT SHORT TERM GOAL #1   Title  Pt will be I and compliant with HEP. 4 weeks  10/25/18    Baseline  no HEP until today; HEP initiated, reviewed and updated (10/16/18)    Status  On-going      PT SHORT TERM GOAL #2   Title  Pt will relay at least 2 new ways to manage his neck pain at home including stretching, heat/ice, TENS, gentle motion, relaxation techniques. 4 weeks 10/25/18    Baseline  10/16/18:  stretching helps; trying to use heat, TENS at home    Status  On-going  PT Long Term Goals - 09/27/18 1150      PT LONG TERM GOAL #1   Title  Pt will improve neck ROM to [facility] (at least 75% ROM). 8 weeks 11/22/18    Baseline  less than 25% neck ROM.     Status  New      PT LONG TERM GOAL #2   Title  Pt will be able to perform his usual ADL's without increasing increment of pain 20%. 8 weeks 11/22/18    Baseline  9/10 pain    Status  New      PT LONG TERM GOAL #3   Title  Pt will relay improved sleep quality due to less overall neck pain. 8 weeks 11/22/18    Baseline  not sleeping well due to neck pain and discomfort    Status  New            Plan - 10/16/18 1136    Clinical Impression Statement  Skilled PT session continuing to focus on gentle ROM, overpressures and stretches that patient can perform at home, as well as TENs/heat instruction (pt has at home, was given new electrodes), and self-massage instruction using tennis balls against wall.  Pt verbalizes frustration that the feels better after therapy, but then tends to stiffen back up again.  Really focused on using "tools" we have instructed him in (as mentioned above) to use throughout the day to manage pain and avoid it getting so bad.  Pt seems to verbalize understanding.  Pt will continue to benefit from skilled PT to address neck pain, flexibility limitations.    Personal Factors and Comorbidities  Comorbidity 1;Comorbidity 2;Past/Current Experience;Fitness    Comorbidities  PMH: chronic LBP, bilat CTS, anx, bipolar,dep, a-fib, GERD, alcohol abuse, HTN,migranes, asthma,schizophrenia,sciatica, CVA     Examination-Activity Limitations  Hygiene/Grooming;Reach Overhead    Examination-Participation Restrictions  Driving;Meal Prep;Cleaning;Community Activity    Stability/Clinical Decision Making  Evolving/Moderate complexity    Rehab Potential  Fair    PT Frequency  Other (comment)   1-2   PT Duration  8 weeks    PT Treatment/Interventions  Cryotherapy;Electrical Stimulation;Iontophoresis 4mg /ml Dexamethasone;Moist Heat;Traction;Ultrasound;Therapeutic activities;Therapeutic exercise;Neuromuscular re-education;Manual techniques;Passive range of motion;Dry needling;Taping;Spinal Manipulations;Joint Manipulations    PT Next Visit Plan  Check STGs and ask for more visits from Medicaid, per POC/ LTGs; Continue neck ROM (check ROM measures), stretching, passive overpressures for home; consider MT for STM, modalities for pain    PT Home Exercise Plan  [id number], UT and levator stretch, doorway stretch, cerv SNAG ext and rotation with towel, chin tucks, rows and ext with green band    Consulted and Agree with Plan of Care  Patient       Patient will benefit from skilled therapeutic intervention in order to improve the following deficits and impairments:  Decreased activity tolerance, Decreased endurance, Decreased range of motion, Decreased strength, Hypomobility, Increased muscle spasms, Increased fascial restricitons, Pain, Postural dysfunction  Visit Diagnosis: Cervicalgia     Problem List Patient Active Problem List   Diagnosis Date Noted  . Musculoskeletal chest pain 07/24/2018  . Asthma, mild intermittent 05/02/2018  . Allergic rhinitis caused by mold 05/02/2018  . Tobacco use 05/02/2018  . Bilateral carpal tunnel syndrome 01/10/2018  . Stroke ([place])   . Schizophrenia ([place])   . Migraine   . History of nuclear stress test   . History of alcohol abuse   . GERD (gastroesophageal reflux disease)   . Dysrhythmia   . Chronic lower back pain   .  Anxiety   . Arthritis of knee, right  04/12/2017  . Bipolar disorder ([place]) 04/03/2017  . Lipoma of forehead 09/22/2016  . Trigger ring finger of right hand 06/22/2016  . Paroxysmal atrial fibrillation ([facility])   . AF (atrial fibrillation) ([place]) 12/10/2015  . Eczema 07/10/2015  . History of CVA (cerebrovascular accident) 05/04/2015  . Tear of medial meniscus of right knee 03/18/2015  . Chronic pain of right knee 03/12/2015  . Other and unspecified hyperlipidemia 11/25/2013  . Nasal congestion 11/25/2013  . Sinusitis, chronic 05/09/2013  . Chronic low back pain 10/12/2012  . Lumbar radiculopathy 10/12/2012  . Hypertension 10/12/2012  . Depression 10/12/2012  . Insomnia 10/12/2012    Akyla Vavrek W. 10/16/2018, 1:45 PM  Frazier Butt., PT   [facility] [address] [place], [place], [address] Phone: [phone]   Fax:  [phone]  Name: Jason Stokes MRN: [id number] Date of Birth: 1960-07-22

## 2018-10-16 NOTE — Patient Instructions (Addendum)
Access Code: [email or web address]  URL: [email or web address]  Date: 10/16/2018  Prepared by: Mady Haagensen   Exercises  Standing Cervical Rotation AROM with Overpressure - 3 reps - 1 sets - 2-3x daily - 7x weekly  Seated Cervical Rotation AROM - 10 reps - 3 sets - 2-3x daily - 7x weekly  Seated Cervical Flexion AROM - 10 reps - 3 sets - 2-3x daily - 7x weekly   Also provided patient with two tennis balls in knotted stockinette material, for self-massage to upper cervical musculature

## 2018-10-19 ENCOUNTER — Ambulatory Visit (INDEPENDENT_AMBULATORY_CARE_PROVIDER_SITE_OTHER): Payer: Medicaid Other | Admitting: *Deleted

## 2018-10-19 ENCOUNTER — Telehealth: Payer: Self-pay | Admitting: *Deleted

## 2018-10-19 DIAGNOSIS — J309 Allergic rhinitis, unspecified: Secondary | ICD-10-CM

## 2018-10-19 NOTE — Telephone Encounter (Signed)
Called patient to let them know due to recent [place] and Health Department Protocols, we are not seeing patients in the office. We are instead seeing if they would like to schedule this appointment as a Research scientist (medical) or Laptop. Unable to reach patient. LVMTCB

## 2018-10-22 NOTE — Progress Notes (Signed)
Subjective:    Patient ID: Jason Stokes, male    DOB: October 26, 1960, 58 y.o.   MRN: [id number] Virtual Visit via Telephone Note  I connected with [name] on 10/23/18 at  9:00 AM EDT by telephone and verified that I am speaking with the correct person using two identifiers.   I discussed the limitations, risks, security and privacy concerns of performing an evaluation and management service by telephone and the availability of in person appointments. I also discussed with the patient that there may be a patient responsible charge related to this service. The patient expressed understanding and agreed to proceed.   History of Present Illness:  58 y.o.M with history of severe persistent asthma and chronic allergic rhinitis and recurrent sinusitis.  This is a telephone visit and I confirmed the patient was on the phone by himself  The patient notes with the increase in pollen he is having increased sinus pressure and yellow mucus out of the nose.  He has increased soreness in the throat.  He is not short of breath mostly goes out of doors.  The patient notes no fever.  There is no cough.  The patient is on Astelin nasal spray along with Flonase and Zyrtec and also antihistamine eyedrops.  Patient does have the albuterol inhaler which he uses as needed.  The patient notes increasing postnasal drainage.  He states the mold situation in his rental house has been mitigated.  Shortness of Breath  This is a recurrent problem. The current episode started more than 1 year ago. The problem occurs daily (dyspnea with exertion, 137ft or up steps). The problem has been gradually worsening. Associated symptoms include headaches, rhinorrhea, a sore throat and wheezing. Pertinent negatives include no chest pain, ear pain, fever, hemoptysis, leg pain, leg swelling, orthopnea, PND, rash, sputum production or swollen glands. The symptoms are aggravated by any activity, exercise, pollens, odors, fumes, lying  flat and animal exposure. Associated symptoms comments: Mucus is foamy, and foamy   Chest is tight on the left side, notes wheezing esp at night. He has tried beta agonist inhalers (using SABA frequently) for the symptoms. The treatment provided moderate relief. His past medical history is significant for allergies. There is no history of asthma, CAD, DVT, a heart failure, PE or pneumonia. (Is on immunotherapy injections x 1 month, hx of afib)    Past Medical History:  Diagnosis Date  . Anxiety   . Bilateral carpal tunnel syndrome 01/10/2018  . Bipolar disorder ([place])   . Chronic lower back pain   . Depression   . Dysrhythmia    a-fib  . GERD (gastroesophageal reflux disease)   . History of alcohol abuse   . History of nuclear stress test    Myoview 10/16: EF 50%, diaphragmatic attenuation, no ischemia, low risk  . Hypertension   . Migraine 2012-2014  . Moderate persistent asthma with acute exacerbation 05/02/2018  . PAF (paroxysmal atrial fibrillation) ([place]) 03/27/2015   a. Myoview neg for ischemia >> Flecainide started 10/16 >> FU ETT   . Schizophrenia ([place])   . Sciatica neuralgia   . Small vessel disease ([place])    Right basal ganglia stroke  . Stroke [facility]) "between 2012-2014"   residual "AF" (09/22/2015)     Family History  Problem Relation Age of Onset  . Heart disease Father   . Schizophrenia Sister      Social History   Socioeconomic History  . Marital status: Single    Spouse  name: [name]  . Number of children: 3  . Years of education: HS  . Highest education level: Not on file  Occupational History    Comment: disabled  Social Needs  . Financial resource strain: Not on file  . Food insecurity:    Worry: Not on file    Inability: Not on file  . Transportation needs:    Medical: Not on file    Non-medical: Not on file  Tobacco Use  . Smoking status: Current Some Day Smoker    Packs/day: 0.10    Years: 29.00    Pack years: 2.90    Types: Cigarettes  .  Smokeless tobacco: Never Used  Substance and Sexual Activity  . Alcohol use: Yes    Alcohol/week: 0.0 standard drinks    Comment: drinks social  . Drug use: Yes    Types: Marijuana    Comment: smoked marijuana 01-02-17 for pain  . Sexual activity: Not Currently  Lifestyle  . Physical activity:    Days per week: Not on file    Minutes per session: Not on file  . Stress: Not on file  Relationships  . Social connections:    Talks on phone: Not on file    Gets together: Not on file    Attends religious service: Not on file    Active member of club or organization: Not on file    Attends meetings of clubs or organizations: Not on file    Relationship status: Not on file  . Intimate partner violence:    Fear of current or ex partner: Not on file    Emotionally abused: Not on file    Physically abused: Not on file    Forced sexual activity: Not on file  Other Topics Concern  . Not on file  Social History Narrative   Patient lives at home with [place].    Patient has 3 children 2 step.    Patient has 13 years of schooling.    Patient is right handed.      Allergies  Allergen Reactions  . Lisinopril Anaphylaxis    Swelling of lips and tongue.     Outpatient Medications Prior to Visit  Medication Sig Dispense Refill  . albuterol (PROAIR HFA) 108 (90 Base) MCG/ACT inhaler Inhale 2 puffs into the lungs every 4 (four) hours as needed for wheezing or shortness of breath. 1 Inhaler 4  . apixaban (ELIQUIS) 5 MG TABS tablet Take 1 tablet (5 mg total) by mouth 2 (two) times daily. 60 tablet 5  . Azelastine HCl 0.15 % SOLN Place 1 spray into both nostrils 2 (two) times daily. 30 mL 5  . carvedilol (COREG) 12.5 MG tablet Take 1 tablet (12.5 mg total) by mouth 2 (two) times daily. 60 tablet 3  . cetirizine (ZYRTEC) 10 MG tablet TAKE 1 TABLET BY MOUTH EVERY DAY 30 tablet 2  . CYMBALTA 60 MG capsule Take 60 mg by mouth daily.  1  . diltiazem (CARDIZEM CD) 300 MG 24 hr capsule Take 1 capsule  by mouth daily.  2  . dronedarone (MULTAQ) 400 MG tablet Take 1 tablet (400 mg total) by mouth 2 (two) times daily with a meal. 60 tablet 6  . fluticasone (FLONASE) 50 MCG/ACT nasal spray Two sprays in each nostril twice a day for 5 days then reduce to once a day two sprays each nostril 16 g 1  . hydrALAZINE (APRESOLINE) 100 MG tablet Take 1 tablet (100 mg total) by mouth 3 (  three) times daily. 270 tablet 3  . ketotifen (ZADITOR) 0.025 % ophthalmic solution Place 1 drop into both eyes 2 (two) times daily. 10 mL 1  . Olopatadine HCl (PAZEO) 0.7 % SOLN Place 1 drop into both eyes daily. 1 Bottle 5  . omeprazole (PRILOSEC) 20 MG capsule Take 1 capsule (20 mg total) by mouth daily. 30 capsule 3  . OXcarbazepine (TRILEPTAL) 150 MG tablet Take 1 tablet (150 mg total) by mouth 2 (two) times daily. 60 tablet 2  . Oxycodone HCl 10 MG TABS Take 1 tablet (10 mg total) by mouth 3 (three) times daily.  0  . prazosin (MINIPRESS) 1 MG capsule TAKE 1 CAPSULE BY MOUTH EVERYDAY AT BEDTIME  2  . pregabalin (LYRICA) 100 MG capsule Take 1 capsule (100 mg total) by mouth 2 (two) times daily.    Marland Kitchen tiZANidine (ZANAFLEX) 4 MG tablet Take 1 tablet (4 mg total) by mouth every 8 (eight) hours as needed for muscle spasms. 90 tablet 2  . triamcinolone ointment (KENALOG) 0.5 % Apply 1 application topically 2 (two) times daily. 30 g 0   No facility-administered medications prior to visit.     Review of Systems  Constitutional: Negative for fever.  HENT: Positive for postnasal drip, rhinorrhea, sinus pressure, sinus pain, sneezing, sore throat and tinnitus. Negative for ear discharge and ear pain.   Eyes: Positive for redness and itching.  Respiratory: Positive for shortness of breath and wheezing. Negative for cough, hemoptysis, sputum production and chest tightness.   Cardiovascular: Negative for chest pain, palpitations, orthopnea, leg swelling and PND.  Gastrointestinal: Negative.   Skin: Negative.  Negative for rash.   Allergic/Immunologic: Positive for environmental allergies.  Neurological: Positive for headaches.  Psychiatric/Behavioral: Negative for dysphoric mood. The patient is nervous/anxious.    Observations/Objective: No observations were made as this is a telephone visit  Assessment and Plan: #1 severe allergic rhinitis with associated recurrent sinusitis and underlying asthma  Plan will be to prescribe pulse prednisone for 5 days 40 mg daily then discontinue  and also a 7-day course of doxycycline for sinusitis We will also renew Astelin and Flonase nasal sprays along with antihistamine eyedrops.  The patient had no other concerns Follow Up Instructions: We will plan to follow this patient up in the office within the next 6 to 8 weeks   I discussed the assessment and treatment plan with the patient. The patient was provided an opportunity to ask questions and all were answered. The patient agreed with the plan and demonstrated an understanding of the instructions.   The patient was advised to call back or seek an in-person evaluation if the symptoms worsen or if the condition fails to improve as anticipated.  I provided 15 minutes of non-face-to-face time during this encounter.   Asencion Noble, MD

## 2018-10-23 ENCOUNTER — Other Ambulatory Visit: Payer: Self-pay

## 2018-10-23 ENCOUNTER — Encounter: Payer: Self-pay | Admitting: Critical Care Medicine

## 2018-10-23 ENCOUNTER — Ambulatory Visit: Payer: Medicaid Other | Attending: Critical Care Medicine | Admitting: Critical Care Medicine

## 2018-10-23 ENCOUNTER — Ambulatory Visit: Payer: Medicaid Other | Attending: Neurology | Admitting: Physical Therapy

## 2018-10-23 ENCOUNTER — Encounter: Payer: Self-pay | Admitting: Physical Therapy

## 2018-10-23 DIAGNOSIS — J4520 Mild intermittent asthma, uncomplicated: Secondary | ICD-10-CM

## 2018-10-23 DIAGNOSIS — J321 Chronic frontal sinusitis: Secondary | ICD-10-CM

## 2018-10-23 DIAGNOSIS — J301 Allergic rhinitis due to pollen: Secondary | ICD-10-CM

## 2018-10-23 DIAGNOSIS — J0101 Acute recurrent maxillary sinusitis: Secondary | ICD-10-CM

## 2018-10-23 DIAGNOSIS — M542 Cervicalgia: Secondary | ICD-10-CM | POA: Diagnosis present

## 2018-10-23 DIAGNOSIS — R293 Abnormal posture: Secondary | ICD-10-CM | POA: Diagnosis present

## 2018-10-23 MED ORDER — DOXYCYCLINE HYCLATE 100 MG PO TABS: 100.0000 mg | ORAL_TABLET | Freq: Two times a day (BID) | ORAL | 0 refills | Status: AC

## 2018-10-23 MED ORDER — PREDNISONE 10 MG PO TABS: ORAL_TABLET | ORAL | 0 refills | Status: DC

## 2018-10-23 MED ORDER — AZELASTINE HCL 0.15 % NA SOLN: 1.0000 | Freq: Two times a day (BID) | NASAL | 5 refills | Status: DC

## 2018-10-23 MED ORDER — OLOPATADINE HCL 0.7 % OP SOLN: 1.0000 [drp] | Freq: Every day | OPHTHALMIC | 5 refills | Status: DC

## 2018-10-23 MED ORDER — FLUTICASONE PROPIONATE 50 MCG/ACT NA SUSP: NASAL | 2 refills | Status: DC

## 2018-10-23 NOTE — Progress Notes (Signed)
Worked up patient for tele-visit with Dr. Joya Gaskins. He states that his breathing was doing "ok" until the season changed. Is worried that he has a sinus infection.

## 2018-10-23 NOTE — Patient Instructions (Signed)
  Use rolling pin with wife's assistance to roll along shoulder muscles (upper traps), light to moderate massage  Shoulder blade squeezes in sitting, 5-10 reps  Also provided picture of lying down chin tucks, shoulder press

## 2018-10-23 NOTE — Addendum Note (Signed)
Addended by: Frazier Butt on: 10/23/2018 03:58 PM   Modules accepted: Orders

## 2018-10-23 NOTE — Therapy (Signed)
[facility] [address] [place], [place], [address] Phone: [phone]   Fax:  [phone]  Physical Therapy Treatment  Patient Details  Name: Jason Stokes MRN: [id number] Date of Birth: [birth year] Referring Provider (PT): Kathrynn Ducking, MD   Encounter Date: 10/23/2018  PT End of Session - 10/23/18 1540    Visit Number  4    Number of Visits  12    Date for PT Re-Evaluation  12/22/18    Authorization Type  MCD -approved 3 visits 10/08/2018-10/28/2018; have requested additional visits    Authorization - Visit Number  3    Authorization - Number of Visits  3    PT Start Time  1025    PT Stop Time  1105    PT Time Calculation (min)  40 min    Activity Tolerance  Patient tolerated treatment well   Pt reports decreased pain to 6/10 at end of session   Behavior During Therapy  [facility] for tasks assessed/performed       Past Medical History:  Diagnosis Date  . Anxiety   . Bilateral carpal tunnel syndrome 01/10/2018  . Bipolar disorder ([place])   . Chronic lower back pain   . Depression   . Dysrhythmia    a-fib  . GERD (gastroesophageal reflux disease)   . History of alcohol abuse   . History of nuclear stress test    Myoview 10/16: EF 50%, diaphragmatic attenuation, no ischemia, low risk  . Hypertension   . Migraine 2012-2014  . Moderate persistent asthma with acute exacerbation 05/02/2018  . PAF (paroxysmal atrial fibrillation) ([place]) 03/27/2015   a. Myoview neg for ischemia >> Flecainide started 10/16 >> FU ETT   . Schizophrenia ([place])   . Sciatica neuralgia   . Small vessel disease ([place])    Right basal ganglia stroke  . Stroke [facility]) "between 2012-2014"   residual "AF" (09/22/2015)    Past Surgical History:  Procedure Laterality Date  . ATRIAL FIBRILLATION ABLATION  09/22/2015  . CYST EXCISION  1996-97   surgery back of head   . ELECTROPHYSIOLOGIC STUDY N/A 09/22/2015   Procedure: Atrial Fibrillation Ablation;   Surgeon: Will [name], MD;  Location: [facility];  Service: Cardiovascular;  Laterality: N/A;  . ELECTROPHYSIOLOGIC STUDY N/A 12/10/2015   Procedure: Atrial Fibrillation Ablation;  Surgeon: Will [name], MD;  Location: [facility];  Service: Cardiovascular;  Laterality: N/A;  . ELECTROPHYSIOLOGIC STUDY N/A 12/11/2015   Procedure: Cardioversion;  Surgeon: Will [name], MD;  Location: [facility];  Service: Cardiovascular;  Laterality: N/A;  . EXCISION MASS HEAD N/A 01/06/2017   Procedure: EXCISION MASS FOREHEAD;  Surgeon: Irene Limbo, MD;  Location: [facility];  Service: Plastics;  Laterality: N/A;  . GANGLION CYST EXCISION Left   . INTERCOSTAL NERVE BLOCK  2005  . KNEE ARTHROSCOPY Right 2016    There were no vitals filed for this visit.  Subjective Assessment - 10/23/18 1027    Subjective  Just having a problem with my allergies.  Therapy is helping, but not really helping.  My back pain is flared up.  Had a real quick movement this weekend and felt the shooting pain down my back.    Pertinent History  PMH: chronic LBP, bilat CTS, anx, bipolar,dep, a-fib, GERD, alcohol abuse, HTN,migranes, asthma,schizophrenia,sciatica, CVA    Limitations  Sitting;Reading;Lifting;Standing;Walking    Diagnostic tests  Abnormal MRI scan of cervical spine showing prominent spondylitic  change at C3-4 with moderate left sided foraminal narrowing. C4-5 and C5-6 also shows similar but milder changes. Compared with previous MRI from 10/08/2014 these changes appear to have progressed    Patient Stated Goals  get the pain down and move my neck better    Currently in Pain?  Yes    Pain Score  8     Pain Location  Neck    Pain Orientation  Right    Pain Descriptors / Indicators  Aching;Tightness;Shooting    Pain Type  Chronic pain    Pain Onset  More than a month ago    Pain Frequency  Intermittent    Aggravating Factors   quick movements    Pain Relieving  Factors  pain meds, sometimes neck stretching         [facility] PT Assessment - 10/23/18 0001      AROM   AROM Assessment Site  Cervical    Cervical Flexion  35    Cervical Extension  25    Cervical - Right Side Bend  15    Cervical - Left Side Bend  15    Cervical - Right Rotation  45    Cervical - Left Rotation  60                   [facility] Adult PT Treatment/Exercise - 10/23/18 0001      Self-Care   Self-Care  Other Self-Care Comments    Other Self-Care Comments   Verbally reviewed techniques and strategies to help control pain-pt able to verbalize TENS, gentle exercises and heat/ice as ways to help decrease pain.  Also, he reports wife massages shoulder/upper back area.  Asked about tennis ball sef-massage and pt reports he did not try that at home.  Educated patient in how wife could assist with massage using foam roll or rolling pin along upper/mid traps. (brief demo with pt reporting some relief in pain).      Exercises   Exercises  Neck;Shoulder      Neck Exercises: Seated   Other Seated Exercise  Seated scapular retraction 2 sets x 5 reps    Other Seated Exercise  Review of seated cervical rotation, flexion/extension with passive overpressures-pt return demo understanding.      Neck Exercises: Supine   Neck Retraction  5 reps   2 sets     Shoulder Exercises: Supine   Other Supine Exercises  Shoulder press into pillow, 2 sets x 5 reps      Manual Therapy   Manual Therapy  Passive ROM;Soft tissue mobilization   in sitting-cues for upright posture   Manual therapy comments  In sitting,massage using foam roller to upper, mid traps, paraspinal area of lower c-spine, for relaxation, decreased stiffness    Soft tissue mobilization  Gentle manual distraction stretch to upper traps, SCM 5 reps each side    Passive ROM  Gentle passive ROM cervical rotation and sidebending for improved fluid motion             PT Education - 10/23/18 1536    Education Details   Updates to HEP-see instructions    Person(s) Educated  Patient    Methods  Explanation;Demonstration;Verbal cues;Handout    Comprehension  Verbalized understanding;Returned demonstration;Verbal cues required       PT Short Term Goals - 10/23/18 1030      PT SHORT TERM GOAL #1   Title  Pt will be I and compliant with HEP. 4 weeks 10/25/18  Baseline  no HEP until today; HEP initiated, reviewed and updated (10/16/18)    Status  Achieved      PT SHORT TERM GOAL #2   Title  Pt will relay at least 2 new ways to manage his neck pain at home including stretching, heat/ice, TENS, gentle motion, relaxation techniques. 4 weeks 10/25/18    Baseline  10/16/18:  stretching helps; trying to use heat, TENS at home    Status  Achieved        PT Long Term Goals - 10/23/18 1548      PT LONG TERM GOAL #1   Title  Pt will improve neck ROM to [facility] (at least 75% ROM). 8 weeks 11/22/2018    Baseline  R rotation 45 degrees, extension 25 degrees 10/23/2018    Time  4    Status  On-going    Target Date  11/22/18      PT LONG TERM GOAL #2   Title  Pt will be able to perform his usual ADL's without increasing increment of pain 20%. 8 weeks 11/22/18    Baseline  9/10 pain; up to 10/10 pain 10/23/2018    Time  4    Status  On-going    Target Date  11/22/18      PT LONG TERM GOAL #3   Title  Pt will relay improved sleep quality due to less overall neck pain. 8 weeks 11/22/18    Baseline  not sleeping well due to neck pain and discomfort; 10/23/2018-6 hours of sleep, but has difficulty finding good position    Time  4    Status  On-going    Target Date  11/22/18            Plan - 10/23/18 1541    Clinical Impression Statement  Assessed STGs today, with pt meeting 2 of 2 STGs.  Pt rates pain as 8/10 (down to 6/10 pain at end of session), which patient overall neck pain has not been as high as 10/10 as much as previously.  He feels that back pain is limiting progression with neck pain.  Overall, neck flexibility  and range of motion has improved, and this session's exercise and education was focused on postural exercise and ways wife can help with massage with rolling pin or foam roller.  Pt would conitnue to benefit from skilled PT to further address neck flexibility, postural re-education, pain control for more full participation in ADLs.    Personal Factors and Comorbidities  Comorbidity 1;Comorbidity 2;Past/Current Experience;Fitness    Comorbidities  PMH: chronic LBP, bilat CTS, anx, bipolar,dep, a-fib, GERD, alcohol abuse, HTN,migranes, asthma,schizophrenia,sciatica, CVA    Examination-Activity Limitations  Hygiene/Grooming;Reach Overhead    Examination-Participation Restrictions  Driving;Meal Prep;Cleaning;Community Activity    Stability/Clinical Decision Making  Evolving/Moderate complexity    Rehab Potential  Fair    PT Frequency  2x / week    PT Duration  4 weeks   per recert 03/22/6386   PT Treatment/Interventions  Cryotherapy;Electrical Stimulation;Iontophoresis 4mg /ml Dexamethasone;Moist Heat;Traction;Ultrasound;Therapeutic activities;Therapeutic exercise;Neuromuscular re-education;Manual techniques;Passive range of motion;Dry needling;Taping;Spinal Manipulations;Joint Manipulations    PT Next Visit Plan  Review updates to HEP, continue postural strengthening and re-education, review how pt has been able to work on manual therapy at home (?with wife's assistance iwth roller or with tennis balls)    PT Home Exercise Plan  [id number], [place] and levator stretch, doorway stretch, cerv SNAG ext and rotation with towel, chin tucks, rows and ext with green band  Consulted and Agree with Plan of Care  Patient       Patient will benefit from skilled therapeutic intervention in order to improve the following deficits and impairments:  Decreased activity tolerance, Decreased endurance, Decreased range of motion, Decreased strength, Hypomobility, Increased muscle spasms, Increased fascial restricitons, Pain,  Postural dysfunction  Visit Diagnosis: Cervicalgia  Abnormal posture     Problem List Patient Active Problem List   Diagnosis Date Noted  . Musculoskeletal chest pain 07/24/2018  . Asthma, mild intermittent 05/02/2018  . Allergic rhinitis caused by mold 05/02/2018  . Tobacco use 05/02/2018  . Bilateral carpal tunnel syndrome 01/10/2018  . Stroke ([place])   . Schizophrenia ([place])   . Migraine   . History of nuclear stress test   . History of alcohol abuse   . GERD (gastroesophageal reflux disease)   . Dysrhythmia   . Chronic lower back pain   . Anxiety   . Arthritis of knee, right 04/12/2017  . Bipolar disorder ([place]) 04/03/2017  . Lipoma of forehead 09/22/2016  . Trigger ring finger of right hand 06/22/2016  . Paroxysmal atrial fibrillation ([facility])   . AF (atrial fibrillation) ([place]) 12/10/2015  . Eczema 07/10/2015  . History of CVA (cerebrovascular accident) 05/04/2015  . Tear of medial meniscus of right knee 03/18/2015  . Chronic pain of right knee 03/12/2015  . Other and unspecified hyperlipidemia 11/25/2013  . Nasal congestion 11/25/2013  . Sinusitis, chronic 05/09/2013  . Chronic low back pain 10/12/2012  . Lumbar radiculopathy 10/12/2012  . Hypertension 10/12/2012  . Depression 10/12/2012  . Insomnia 10/12/2012    Bobbyjoe Pabst W. 10/23/2018, 3:53 PM Frazier Butt., PT  [facility] [address] [place], [place], [address] Phone: [phone]   Fax:  [phone]  Name: GOTTFRIED STANDISH MRN: [id number] Date of Birth: [birth year]

## 2018-10-24 NOTE — Telephone Encounter (Signed)
Called patient to let them know due to recent [place] and Health Department Protocols, we are not seeing patients in the office. We are instead seeing if they would like to schedule this appointment as a Nurse, children's or Laptop. Patient is aware if they decide to reschedule this appointment, they may not be seen or scheduled for the next 4-6 months. Patient at this time declines Virtual Visits. Patient does not have computer, internet, or smart phone access. Message sent scheduling and nurse.   Concerns and/or Complaints:                    Since your last visit or hospitalization:   1. Have you been having new or worsening chest pain? NO 2. Have you been having new or worsening shortness of breath? NO  3. Have you been having new or worsening leg swelling, wt gain, or increase in abdominal girth (pants fitting more tightly)? NO 4. Have you had any passing out spells? NO 5. Have you had any extreme tiredness or fatigue? NO   Only current complaint of allergies.

## 2018-10-24 NOTE — Telephone Encounter (Signed)
Called Jason Stokes to discuss further. Jason Stokes agreeable to telephone visit with Dr. Curt Bears since he does not have smart phone/computer. Explained that we haven't seen him since 02/2018 and he is on antiarrythmic it would be beneficial to at least have a telephone conversation about how he has been doing since we last saw him. Aware we will call him next week and time given to Jason Stokes.  Jason Stokes agreeable to plan.

## 2018-10-30 ENCOUNTER — Telehealth: Payer: Medicaid Other | Admitting: Cardiology

## 2018-10-30 ENCOUNTER — Ambulatory Visit: Payer: Medicaid Other | Admitting: Physical Therapy

## 2018-10-30 NOTE — Telephone Encounter (Signed)
Virtual Visit Pre-Appointment Phone Call  Steps For Call:  1. Confirm consent - "In the setting of the current Covid19 crisis, you are scheduled for a (phone or video) visit with your provider on (date) at (time).  Just as we do with many in-office visits, in order for you to participate in this visit, we must obtain consent.  If you'd like, I can send this to your mychart (if signed up) or email for you to review.  Otherwise, I can obtain your verbal consent now.  All virtual visits are billed to your insurance company just like a normal visit would be.  By agreeing to a virtual visit, we'd like you to understand that the technology does not allow for your provider to perform an examination, and thus may limit your provider's ability to fully assess your condition.  Finally, though the technology is pretty good, we cannot assure that it will always work on either your or our end, and in the setting of a video visit, we may have to convert it to a phone-only visit.  In either situation, we cannot ensure that we have a secure connection.  Are you willing to proceed?" STAFF: Did the patient verbally acknowledge consent to treatment?  YES  2. Give patient instructions for WebEx/MyChart download to smartphone or Doximity/Doxy.me if video visit (depending on what platform provider is using)  3. Advise patient to be prepared with any vital sign or heart rhythm information, their current medicines, and a piece of paper and pen handy for any instructions they may receive the day of their visit  4. Inform patient they will receive a phone call 15 minutes prior to their appointment time (may be from unknown caller ID) so they should be prepared to answer  5. Confirm that appointment type is correct in Epic appointment notes (video vs telephone)    TELEPHONE CALL NOTE  [name] has been deemed a candidate for a follow-up tele-health visit to limit community exposure during the Covid-19 pandemic. I  spoke with the patient via phone to ensure availability of phone/video source, confirm preferred email & phone number, and discuss instructions and expectations.  I reminded Jason Stokes to be prepared with any vital sign and/or heart rhythm information that could potentially be obtained via home monitoring, at the time of his visit. I reminded Jason Stokes to expect a phone call at the time of his visit if his visit.  Stanton Kidney, RN 10/30/2018 9:37 AM   DOWNLOADING THE [place] TO SMARTPHONE  - If Apple, go to [employer] and type in WebEx in the search bar. [place] [employer], the blue/green circle. The app is free but as with any other app downloads, their phone may require them to verify saved payment information or Apple password. The patient does NOT have to create an account.  - If Android, ask patient to go to [employer] and type in WebEx in the search bar. [employer], the blue/green circle. The app is free but as with any other app downloads, their phone may require them to verify saved payment information or Android password. The patient does NOT have to create an account.   CONSENT FOR TELE-HEALTH VISIT - PLEASE REVIEW  I hereby voluntarily request, consent and authorize [facility] and its employed or contracted physicians, physician assistants, nurse practitioners or other licensed health care professionals (the Practitioner), to provide me with telemedicine health care services (the "  Services") as deemed necessary by the treating Practitioner. I acknowledge and consent to receive the Services by the Practitioner via telemedicine. I understand that the telemedicine visit will involve communicating with the Practitioner through live audiovisual communication technology and the disclosure of certain medical information by electronic transmission. I acknowledge that I have been given the opportunity to request an in-person assessment or  other available alternative prior to the telemedicine visit and am voluntarily participating in the telemedicine visit.  I understand that I have the right to withhold or withdraw my consent to the use of telemedicine in the course of my care at any time, without affecting my right to future care or treatment, and that the Practitioner or I may terminate the telemedicine visit at any time. I understand that I have the right to inspect all information obtained and/or recorded in the course of the telemedicine visit and may receive copies of available information for a reasonable fee.  I understand that some of the potential risks of receiving the Services via telemedicine include:  Marland Kitchen Delay or interruption in medical evaluation due to technological equipment failure or disruption; . Information transmitted may not be sufficient (e.g. poor resolution of images) to allow for appropriate medical decision making by the Practitioner; and/or  . In rare instances, security protocols could fail, causing a breach of personal health information.  Furthermore, I acknowledge that it is my responsibility to provide information about my medical history, conditions and care that is complete and accurate to the best of my ability. I acknowledge that Practitioner's advice, recommendations, and/or decision may be based on factors not within their control, such as incomplete or inaccurate data provided by me or distortions of diagnostic images or specimens that may result from electronic transmissions. I understand that the practice of medicine is not an exact science and that Practitioner makes no warranties or guarantees regarding treatment outcomes. I acknowledge that I will receive a copy of this consent concurrently upon execution via email to the email address I last provided but may also request a printed copy by calling the office of [facility].    I understand that my insurance will be billed for this visit.   I  have read or had this consent read to me. . I understand the contents of this consent, which adequately explains the benefits and risks of the Services being provided via telemedicine.  . I have been provided ample opportunity to ask questions regarding this consent and the Services and have had my questions answered to my satisfaction. . I give my informed consent for the services to be provided through the use of telemedicine in my medical care  By participating in this telemedicine visit I agree to the above.

## 2018-10-31 ENCOUNTER — Telehealth (INDEPENDENT_AMBULATORY_CARE_PROVIDER_SITE_OTHER): Payer: Medicaid Other | Admitting: Cardiology

## 2018-10-31 ENCOUNTER — Encounter: Payer: Self-pay | Admitting: Cardiology

## 2018-10-31 ENCOUNTER — Other Ambulatory Visit: Payer: Self-pay

## 2018-10-31 DIAGNOSIS — I480 Paroxysmal atrial fibrillation: Secondary | ICD-10-CM | POA: Diagnosis not present

## 2018-10-31 NOTE — Progress Notes (Signed)
Electrophysiology TeleHealth Note   Due to national recommendations of social distancing due to COVID 19, an audio/video telehealth visit is felt to be most appropriate for this patient at this time.  See epic message for the patient's consent to telehealth for [facility].   Date:  10/31/2018   ID:  Jason Stokes, DOB August 21, 1960, MRN [id number]  Location: patient's home  Provider location: [address], [place]  Evaluation Performed: Follow-up visit  PCP:  Charlott Rakes, MD  Cardiologist:  Radford Pax  Electrophysiologist:  Dr Curt Bears  Chief Complaint:  AF  History of Present Illness:    Jason Stokes is a 58 y.o. male who presents via audio/video conferencing for a telehealth visit today.  Since last being seen in our clinic, the patient reports doing very well.  Today, he denies symptoms of palpitations, chest pain, shortness of breath,  lower extremity edema, dizziness, presyncope, or syncope.  The patient is otherwise without complaint today.  The patient denies symptoms of fevers, chills, cough, or new SOB worrisome for COVID 19.  He has a history of paroxysmal atrial fibrillation, hypertension, and remote CVA.  He had an AF ablation on 12/10/2015.  He had a stress test performed afterwards after initiation of flecainide where he had frequent ventricular ectopy and nonsustained VT.  Flecainide was stopped.  Today, denies symptoms of palpitations, chest pain, shortness of breath, orthopnea, PND, lower extremity edema, claudication, dizziness, presyncope, syncope, bleeding, or neurologic sequela. The patient is tolerating medications without difficulties.  Overall he is feeling well.  He has no chest pain or shortness of breath.  Is able to do all of his daily activities.  He has been having some neck pain and was prescribed Lyrica.  He does feel that he went in and out of atrial fibrillation quite a bit more being on the Lyrica and he has since stopped the  medication.  He is currently undergoing physical therapy for neck pain.  Otherwise he is doing well and feels that he is not in atrial fibrillation.  He has not left his house much due to the coronavirus pandemic.  Past Medical History:  Diagnosis Date  . Anxiety   . Bilateral carpal tunnel syndrome 01/10/2018  . Bipolar disorder ([place])   . Chronic lower back pain   . Depression   . Dysrhythmia    a-fib  . GERD (gastroesophageal reflux disease)   . History of alcohol abuse   . History of nuclear stress test    Myoview 10/16: EF 50%, diaphragmatic attenuation, no ischemia, low risk  . Hypertension   . Migraine 2012-2014  . Moderate persistent asthma with acute exacerbation 05/02/2018  . PAF (paroxysmal atrial fibrillation) ([place]) 03/27/2015   a. Myoview neg for ischemia >> Flecainide started 10/16 >> FU ETT   . Schizophrenia ([place])   . Sciatica neuralgia   . Small vessel disease ([place])    Right basal ganglia stroke  . Stroke [facility]) "between 2012-2014"   residual "AF" (09/22/2015)    Past Surgical History:  Procedure Laterality Date  . ATRIAL FIBRILLATION ABLATION  09/22/2015  . CYST EXCISION  1996-97   surgery back of head   . ELECTROPHYSIOLOGIC STUDY N/A 09/22/2015   Procedure: Atrial Fibrillation Ablation;  Surgeon: Kymberlyn Eckford [name], MD;  Location: [facility];  Service: Cardiovascular;  Laterality: N/A;  . ELECTROPHYSIOLOGIC STUDY N/A 12/10/2015   Procedure: Atrial Fibrillation Ablation;  Surgeon: Adahlia Stembridge [name], MD;  Location: [facility]  CV [facility];  Service: Cardiovascular;  Laterality: N/A;  . ELECTROPHYSIOLOGIC STUDY N/A 12/11/2015   Procedure: Cardioversion;  Surgeon: Constantine Ruddick [name], MD;  Location: [place] [facility];  Service: Cardiovascular;  Laterality: N/A;  . EXCISION MASS HEAD N/A 01/06/2017   Procedure: EXCISION MASS FOREHEAD;  Surgeon: Irene Limbo, MD;  Location: [facility];  Service: Plastics;  Laterality: N/A;  . GANGLION CYST EXCISION  Left   . INTERCOSTAL NERVE BLOCK  2005  . KNEE ARTHROSCOPY Right 2016    Current Outpatient Medications  Medication Sig Dispense Refill  . albuterol (PROAIR HFA) 108 (90 Base) MCG/ACT inhaler Inhale 2 puffs into the lungs every 4 (four) hours as needed for wheezing or shortness of breath. 1 Inhaler 4  . apixaban (ELIQUIS) 5 MG TABS tablet Take 1 tablet (5 mg total) by mouth 2 (two) times daily. 60 tablet 5  . Azelastine HCl 0.15 % SOLN Place 1 spray into both nostrils 2 (two) times daily. 30 mL 5  . carvedilol (COREG) 12.5 MG tablet Take 1 tablet (12.5 mg total) by mouth 2 (two) times daily. 60 tablet 3  . cetirizine (ZYRTEC) 10 MG tablet TAKE 1 TABLET BY MOUTH EVERY DAY 30 tablet 2  . CYMBALTA 60 MG capsule Take 60 mg by mouth daily.  1  . diltiazem (CARDIZEM CD) 300 MG 24 hr capsule Take 1 capsule by mouth daily.  2  . dronedarone (MULTAQ) 400 MG tablet Take 1 tablet (400 mg total) by mouth 2 (two) times daily with a meal. 60 tablet 6  . fluticasone (FLONASE) 50 MCG/ACT nasal spray Two sprays in each nostril twice a day for 5 days then reduce to once a day two sprays each nostril 16 g 2  . hydrALAZINE (APRESOLINE) 100 MG tablet Take 1 tablet (100 mg total) by mouth 3 (three) times daily. 270 tablet 3  . Olopatadine HCl (PAZEO) 0.7 % SOLN Place 1 drop into both eyes daily. 1 Bottle 5  . omeprazole (PRILOSEC) 20 MG capsule Take 1 capsule (20 mg total) by mouth daily. 30 capsule 3  . OXcarbazepine (TRILEPTAL) 150 MG tablet Take 1 tablet (150 mg total) by mouth 2 (two) times daily. 60 tablet 2  . prazosin (MINIPRESS) 1 MG capsule TAKE 1 CAPSULE BY MOUTH EVERYDAY AT BEDTIME  2  . predniSONE (DELTASONE) 10 MG tablet Take 4 tablets daily for 5 days then stop 20 tablet 0  . pregabalin (LYRICA) 75 MG capsule Take 1 capsule by mouth 4 (four) times daily as needed.    Marland Kitchen tiZANidine (ZANAFLEX) 4 MG tablet Take 1 tablet (4 mg total) by mouth every 8 (eight) hours as needed for muscle spasms. 90 tablet 2    No current facility-administered medications for this visit.     Allergies:   Lisinopril   Social History:  The patient  reports that he has quit smoking. He quit after 29.00 years of use. He has never used smokeless tobacco. He reports current alcohol use. He reports current drug use. Drug: Marijuana.   Family History:  The patient's  family history includes Heart disease in his father; Schizophrenia in his sister.   ROS:  Please see the history of present illness.   All other systems are personally reviewed and negative.    Exam:    Vital Signs:  There were no vitals taken for this visit.  Over the phone, no acute distress, no shortness of breath   Labs/Other Tests and Data Reviewed:  Recent Labs: 08/14/2018: ALT 22; BUN 13; Creatinine, Ser 1.32; Potassium 3.8; Sodium 142   Wt Readings from Last 3 Encounters:  08/14/18 252 lb 3.2 oz (114.4 kg)  08/14/18 257 lb (116.6 kg)  07/24/18 254 lb (115.2 kg)     Other studies personally reviewed: Additional studies/ records that were reviewed today include: ECG 02/21/2018 personally reviewed Review of the above records today demonstrates:   Sinus rhythm, PAC, rate 73   ASSESSMENT & PLAN:    1.  Paroxysmal atrial fibrillation:Currently on Eliquis and diltiazem.  He has been in and out of atrial fibrillation when he started Lyrica.  He has stopped taking this medication and has done much better.  Would encourage him to undergo physical therapy versus medical management of his neck pain if he does go back into atrial fibrillation.  This patients CHA2DS2-VASc Score and unadjusted Ischemic Stroke Rate (% per year) is equal to 3.2 % stroke rate/year from a score of 3  Above score calculated as 1 point each if present [CHF, HTN, DM, Vascular=MI/PAD/Aortic Plaque, Age if 65-74, or Male] Above score calculated as 2 points each if present [Age > 75, or Stroke/TIA/TE]  2.  Hypertension: Blood pressure had previously been well  controlled.  The patient has not recently taken his blood pressure.  No changes.   COVID 19 screen The patient denies symptoms of COVID 19 at this time.  The importance of social distancing was discussed today.  Follow-up: 6 month  Current medicines are reviewed at length with the patient today.   The patient does not have concerns regarding his medicines.  The following changes were made today:  none  Labs/ tests ordered today include:  No orders of the defined types were placed in this encounter.  This patient has neither a computer or a smart phone and thus the visit was done without video assistance.  The video was a telephone visit.  Patient Risk:  after full review of this patients clinical status, I feel that they are at moderate risk at this time.  Today, I have spent 12 minutes with the patient with telehealth technology discussing atrial fibrillation.    Signed, Rajinder Mesick [name], MD  10/31/2018 12:55 PM     [facility] [address] [place] [address] [phone] (office) [phone] (fax)

## 2018-11-02 ENCOUNTER — Ambulatory Visit: Payer: Medicaid Other | Admitting: Physical Therapy

## 2018-11-02 ENCOUNTER — Ambulatory Visit (INDEPENDENT_AMBULATORY_CARE_PROVIDER_SITE_OTHER): Payer: Medicaid Other | Admitting: *Deleted

## 2018-11-02 DIAGNOSIS — J309 Allergic rhinitis, unspecified: Secondary | ICD-10-CM

## 2018-11-05 ENCOUNTER — Ambulatory Visit: Payer: Medicaid Other | Admitting: Physical Therapy

## 2018-11-06 ENCOUNTER — Ambulatory Visit: Payer: Medicaid Other | Admitting: Physical Therapy

## 2018-11-09 ENCOUNTER — Ambulatory Visit (INDEPENDENT_AMBULATORY_CARE_PROVIDER_SITE_OTHER): Payer: Medicaid Other | Admitting: *Deleted

## 2018-11-09 ENCOUNTER — Ambulatory Visit: Payer: Medicaid Other | Admitting: Physical Therapy

## 2018-11-09 ENCOUNTER — Encounter: Payer: Self-pay | Admitting: Physical Therapy

## 2018-11-09 ENCOUNTER — Other Ambulatory Visit: Payer: Self-pay

## 2018-11-09 DIAGNOSIS — J309 Allergic rhinitis, unspecified: Secondary | ICD-10-CM | POA: Diagnosis not present

## 2018-11-09 DIAGNOSIS — R293 Abnormal posture: Secondary | ICD-10-CM

## 2018-11-09 DIAGNOSIS — M542 Cervicalgia: Secondary | ICD-10-CM

## 2018-11-09 NOTE — Therapy (Signed)
[facility] [address] [place], [place], [address] Phone: [phone]   Fax:  [phone]  Physical Therapy Treatment  Patient Details  Name: Jason Stokes MRN: [id number] Date of Birth: 04/10/1961 Referring Provider (PT): Kathrynn Ducking, MD   Encounter Date: 11/09/2018  PT End of Session - 11/09/18 1439    Visit Number  5    Number of Visits  12    Date for PT Re-Evaluation  11/29/18    Authorization Type  CCME approved 8 visits from 4/17 - 11/29/2018    Authorization - Visit Number  1    Authorization - Number of Visits  8    PT Start Time  1021    PT Stop Time  1105    PT Time Calculation (min)  44 min    Activity Tolerance  No increased pain   Pt reports decreased pain to 6/10 at end of session   Behavior During Therapy  [facility] for tasks assessed/performed       Past Medical History:  Diagnosis Date  . Anxiety   . Bilateral carpal tunnel syndrome 01/10/2018  . Bipolar disorder ([place])   . Chronic lower back pain   . Depression   . Dysrhythmia    a-fib  . GERD (gastroesophageal reflux disease)   . History of alcohol abuse   . History of nuclear stress test    Myoview 10/16: EF 50%, diaphragmatic attenuation, no ischemia, low risk  . Hypertension   . Migraine 2012-2014  . Moderate persistent asthma with acute exacerbation 05/02/2018  . PAF (paroxysmal atrial fibrillation) ([place]) 03/27/2015   a. Myoview neg for ischemia >> Flecainide started 10/16 >> FU ETT   . Schizophrenia ([place])   . Sciatica neuralgia   . Small vessel disease ([place])    Right basal ganglia stroke  . Stroke [facility]) "between 2012-2014"   residual "AF" (09/22/2015)    Past Surgical History:  Procedure Laterality Date  . ATRIAL FIBRILLATION ABLATION  09/22/2015  . CYST EXCISION  1996-97   surgery back of head   . ELECTROPHYSIOLOGIC STUDY N/A 09/22/2015   Procedure: Atrial Fibrillation Ablation;  Surgeon: Will [name], MD;  Location: [facility];  Service: Cardiovascular;  Laterality: N/A;  . ELECTROPHYSIOLOGIC STUDY N/A 12/10/2015   Procedure: Atrial Fibrillation Ablation;  Surgeon: Will [name], MD;  Location: [facility];  Service: Cardiovascular;  Laterality: N/A;  . ELECTROPHYSIOLOGIC STUDY N/A 12/11/2015   Procedure: Cardioversion;  Surgeon: Will [name], MD;  Location: [facility];  Service: Cardiovascular;  Laterality: N/A;  . EXCISION MASS HEAD N/A 01/06/2017   Procedure: EXCISION MASS FOREHEAD;  Surgeon: Irene Limbo, MD;  Location: [facility];  Service: Plastics;  Laterality: N/A;  . GANGLION CYST EXCISION Left   . INTERCOSTAL NERVE BLOCK  2005  . KNEE ARTHROSCOPY Right 2016    There were no vitals filed for this visit.  Subjective Assessment - 11/09/18 1024    Subjective  Having same amount of back pain; "has learned to just deal with it."  Pain in the neck has switched sides, now pulling on the L side, behind his ear.  Not sure if it is the weather because back and knees are hurting more today too.    Pertinent History  PMH: chronic LBP, bilat CTS, anx, bipolar,dep, a-fib, GERD, alcohol abuse, HTN,migranes, asthma,schizophrenia,sciatica, CVA    Limitations  Sitting;Reading;Lifting;Standing;Walking    Diagnostic tests  Abnormal MRI  scan of cervical spine showing prominent spondylitic change at C3-4 with moderate left sided foraminal narrowing. C4-5 and C5-6 also shows similar but milder changes. Compared with previous MRI from 10/08/2014 these changes appear to have progressed    Patient Stated Goals  get the pain down and move my neck better    Currently in Pain?  Yes    Pain Score  10-Worst pain ever    Pain Location  Neck    Pain Orientation  Left    Pain Descriptors / Indicators  Tightness    Pain Type  Chronic pain    Pain Onset  More than a month ago                       [facility] Adult PT Treatment/Exercise - 11/09/18 1027      Neck  Exercises: Supine   Cervical Isometrics  Right rotation;Left rotation;Extension;10 reps;5 secs    Cervical Isometrics Limitations  posterior resistance with pillow, lateral resistance from therapist on temple with head at 45 deg of rotation    Capital Flexion  10 reps    Capital Flexion Limitations  with 45 deg of rotation to L and R x 2 sets    Shoulder ABduction  Both;10 reps    Shoulder Abduction Limitations  bilateral horizontal ABD with scapular retraction with green theraband    Upper Extremity D2  Flexion;Extension;10 reps;Theraband    Theraband Level (UE D2)  Level 3 (Green)    Other Supine Exercise  Isometric scapular and shoulder retraction and depression with 5 second hold x 10 reps    Other Supine Exercise  Supine bilat shoulder ER x 10 reps with green theraband      Modalities   Modalities  Electrical Stimulation;Moist Heat      Moist Heat Therapy   Number Minutes Moist Heat  20 Minutes    Moist Heat Location  Cervical      Electrical Stimulation   Electrical Stimulation Location  posterior neck    Electrical Stimulation Action  Attempted IFC but electrodes would not maintain contact with skin leading to open channel    Electrical Stimulation Parameters  n/a             PT Education - 11/09/18 1439    Education Details  adding more visits    Person(s) Educated  Patient    Methods  Explanation    Comprehension  Verbalized understanding       PT Short Term Goals - 10/23/18 1030      PT SHORT TERM GOAL #1   Title  Pt will be I and compliant with HEP. 4 weeks 10/25/18    Baseline  no HEP until today; HEP initiated, reviewed and updated (10/16/18)    Status  Achieved      PT SHORT TERM GOAL #2   Title  Pt will relay at least 2 new ways to manage his neck pain at home including stretching, heat/ice, TENS, gentle motion, relaxation techniques. 4 weeks 10/25/18    Baseline  10/16/18:  stretching helps; trying to use heat, TENS at home    Status  Achieved         PT Long Term Goals - 10/23/18 1548      PT LONG TERM GOAL #1   Title  Pt will improve neck ROM to [facility] (at least 75% ROM). 8 weeks 11/22/2018    Baseline  R rotation 45 degrees, extension 25 degrees 10/23/2018  Time  4    Status  On-going    Target Date  11/22/18      PT LONG TERM GOAL #2   Title  Pt will be able to perform his usual ADL's without increasing increment of pain 20%. 8 weeks 11/22/18    Baseline  9/10 pain; up to 10/10 pain 10/23/2018    Time  4    Status  On-going    Target Date  11/22/18      PT LONG TERM GOAL #3   Title  Pt will relay improved sleep quality due to less overall neck pain. 8 weeks 11/22/18    Baseline  not sleeping well due to neck pain and discomfort; 10/23/2018-6 hours of sleep, but has difficulty finding good position    Time  4    Status  On-going    Target Date  11/22/18            Plan - 11/09/18 1440    Clinical Impression Statement  Pt returns with increased pain in L side of neck today.  Unable to incorporate TENS unit during exercises but able to apply heat while performing isometric and gentle AROM followed by supine postural exercises.  At end of session pt reported decreased pain to 6/10 (his baseline).  Will continue to address and progress towards LTG.    Personal Factors and Comorbidities  Comorbidity 1;Comorbidity 2;Past/Current Experience;Fitness    Comorbidities  PMH: chronic LBP, bilat CTS, anx, bipolar,dep, a-fib, GERD, alcohol abuse, HTN,migranes, asthma,schizophrenia,sciatica, CVA    Examination-Activity Limitations  Hygiene/Grooming;Reach Overhead    Examination-Participation Restrictions  Driving;Meal Prep;Cleaning;Community Activity    Stability/Clinical Decision Making  Evolving/Moderate complexity    Rehab Potential  Fair    PT Frequency  2x / week    PT Duration  4 weeks   per recert 11/17/8411   PT Treatment/Interventions  Cryotherapy;Electrical Stimulation;Iontophoresis 4mg /ml Dexamethasone;Moist  Heat;Traction;Ultrasound;Therapeutic activities;Therapeutic exercise;Neuromuscular re-education;Manual techniques;Passive range of motion;Dry needling;Taping;Spinal Manipulations;Joint Manipulations    PT Next Visit Plan  Juliann Pulse - his cert ends 2/44 - will need date extension request after your visit with him.  If pain is flared up he responds well to heat and IFC TENS on larger estim machines.  Needs to continue to work on posture.  May benefit from manual therapy but also responded well to gentle, progressive AROM    PT Home Exercise Plan  [id number], UT and levator stretch, doorway stretch, cerv SNAG ext and rotation with towel, chin tucks, rows and ext with green band    Consulted and Agree with Plan of Care  Patient       Patient will benefit from skilled therapeutic intervention in order to improve the following deficits and impairments:  Decreased activity tolerance, Decreased endurance, Decreased range of motion, Decreased strength, Hypomobility, Increased muscle spasms, Increased fascial restricitons, Pain, Postural dysfunction  Visit Diagnosis: Cervicalgia  Abnormal posture     Problem List Patient Active Problem List   Diagnosis Date Noted  . Musculoskeletal chest pain 07/24/2018  . Asthma, mild intermittent 05/02/2018  . Allergic rhinitis caused by mold 05/02/2018  . Tobacco use 05/02/2018  . Bilateral carpal tunnel syndrome 01/10/2018  . Stroke ([place])   . Schizophrenia ([place])   . Migraine   . History of nuclear stress test   . History of alcohol abuse   . GERD (gastroesophageal reflux disease)   . Dysrhythmia   . Chronic lower back pain   . Anxiety   . Arthritis of knee, right 04/12/2017  .  Bipolar disorder ([place]) 04/03/2017  . Lipoma of forehead 09/22/2016  . Trigger ring finger of right hand 06/22/2016  . Paroxysmal atrial fibrillation ([facility])   . AF (atrial fibrillation) ([place]) 12/10/2015  . Eczema 07/10/2015  . History of CVA (cerebrovascular accident) 05/04/2015  .  Tear of medial meniscus of right knee 03/18/2015  . Chronic pain of right knee 03/12/2015  . Other and unspecified hyperlipidemia 11/25/2013  . Nasal congestion 11/25/2013  . Sinusitis, chronic 05/09/2013  . Chronic low back pain 10/12/2012  . Lumbar radiculopathy 10/12/2012  . Hypertension 10/12/2012  . Depression 10/12/2012  . Insomnia 10/12/2012    Rico Junker, PT, DPT 11/09/18    3:01 PM    [facility] [address] [place], [place], [address] Phone: [phone]   Fax:  [phone]  Name: Jason Stokes MRN: [id number] Date of Birth: [birth year]

## 2018-11-16 ENCOUNTER — Ambulatory Visit (INDEPENDENT_AMBULATORY_CARE_PROVIDER_SITE_OTHER): Payer: Medicaid Other | Admitting: *Deleted

## 2018-11-16 DIAGNOSIS — J309 Allergic rhinitis, unspecified: Secondary | ICD-10-CM | POA: Diagnosis not present

## 2018-11-28 ENCOUNTER — Other Ambulatory Visit: Payer: Self-pay

## 2018-11-28 ENCOUNTER — Encounter: Payer: Self-pay | Admitting: Physical Therapy

## 2018-11-28 ENCOUNTER — Ambulatory Visit: Payer: Medicaid Other | Attending: Neurology | Admitting: Physical Therapy

## 2018-11-28 DIAGNOSIS — M542 Cervicalgia: Secondary | ICD-10-CM | POA: Diagnosis present

## 2018-11-28 DIAGNOSIS — R293 Abnormal posture: Secondary | ICD-10-CM | POA: Insufficient documentation

## 2018-11-30 ENCOUNTER — Ambulatory Visit (INDEPENDENT_AMBULATORY_CARE_PROVIDER_SITE_OTHER): Payer: Medicaid Other | Admitting: *Deleted

## 2018-11-30 DIAGNOSIS — J309 Allergic rhinitis, unspecified: Secondary | ICD-10-CM

## 2018-12-01 NOTE — Therapy (Addendum)
[facility] [address] [place], [place], [address] Phone: [phone]   Fax:  [phone]  Physical Therapy Treatment  Patient Details  Name: Jason Stokes MRN: [id number] Date of Birth: February 20, 1961 Referring Provider (PT): Kathrynn Ducking, MD   Encounter Date: 11/28/2018  CLINIC OPERATION CHANGES: [facility] is operating at a low capacity due to COVID-19.  The patient was brought into the clinic for evaluation and/or treatment following universal masking by staff, social distancing, and <10 people in the clinic.  The patient's COVID risk of complications score is 4.     11/28/18 0935  PT Visits / Re-Eval  Visit Number 6  Number of Visits 12  Date for PT Re-Evaluation 11/29/18  Authorization  Authorization Type CCME approved 8 visits from 4/17 - 11/29/2018  Authorization - Visit Number 2  Authorization - Number of Visits 8  PT Time Calculation  PT Start Time 0932  PT Stop Time 1013  PT Time Calculation (min) 41 min  PT - End of Session  Activity Tolerance No increased pain;Patient tolerated treatment well (Pt reports decreased pain to 6/10 at end of session)  Behavior During Therapy [facility] for tasks assessed/performed    Past Medical History:  Diagnosis Date  . Anxiety   . Bilateral carpal tunnel syndrome 01/10/2018  . Bipolar disorder ([place])   . Chronic lower back pain   . Depression   . Dysrhythmia    a-fib  . GERD (gastroesophageal reflux disease)   . History of alcohol abuse   . History of nuclear stress test    Myoview 10/16: EF 50%, diaphragmatic attenuation, no ischemia, low risk  . Hypertension   . Migraine 2012-2014  . Moderate persistent asthma with acute exacerbation 05/02/2018  . PAF (paroxysmal atrial fibrillation) ([place]) 03/27/2015   a. Myoview neg for ischemia >> Flecainide started 10/16 >> FU ETT   . Schizophrenia ([place])   . Sciatica neuralgia   . Small vessel  disease ([place])    Right basal ganglia stroke  . Stroke [facility]) "between 2012-2014"   residual "AF" (09/22/2015)    Past Surgical History:  Procedure Laterality Date  . ATRIAL FIBRILLATION ABLATION  09/22/2015  . CYST EXCISION  1996-97   surgery back of head   . ELECTROPHYSIOLOGIC STUDY N/A 09/22/2015   Procedure: Atrial Fibrillation Ablation;  Surgeon: Will [name], MD;  Location: [place] [facility];  Service: Cardiovascular;  Laterality: N/A;  . ELECTROPHYSIOLOGIC STUDY N/A 12/10/2015   Procedure: Atrial Fibrillation Ablation;  Surgeon: Will [name], MD;  Location: [place] [facility];  Service: Cardiovascular;  Laterality: N/A;  . ELECTROPHYSIOLOGIC STUDY N/A 12/11/2015   Procedure: Cardioversion;  Surgeon: Will [name], MD;  Location: [place] [facility];  Service: Cardiovascular;  Laterality: N/A;  . EXCISION MASS HEAD N/A 01/06/2017   Procedure: EXCISION MASS FOREHEAD;  Surgeon: Irene Limbo, MD;  Location: [place];  Service: Plastics;  Laterality: N/A;  . GANGLION CYST EXCISION Left   . INTERCOSTAL NERVE BLOCK  2005  . KNEE ARTHROSCOPY Right 2016    There were no vitals filed for this visit.     11/28/18 0934  Symptoms/Limitations  Subjective Did some work over the weekend and his neck hurts today. Overdid it on Mother's Day he thinks with grilling.   Pertinent History PMH: chronic LBP, bilat CTS, anx, bipolar,dep, a-fib, GERD, alcohol abuse, HTN,migranes, asthma,schizophrenia,sciatica, CVA  Limitations Sitting;Reading;Lifting;Standing;Walking  Diagnostic tests Abnormal MRI scan of cervical  spine showing prominent spondylitic change at C3-4 with moderate left sided foraminal narrowing. C4-5 and C5-6 also shows similar but milder changes. Compared with previous MRI from 10/08/2014 these changes appear to have progressed  Patient Stated Goals get the pain down and move my neck better  Pain Assessment  Currently in Pain? Yes  Pain Score 9  Pain  Location Neck  Pain Orientation Left  Pain Descriptors / Indicators Aching;Tightness  Pain Type Chronic pain  Pain Frequency Intermittent  Aggravating Factors  quick movements  Pain Relieving Factors pain meds, sometimes neck stretching      11/28/18 1943  Neck Exercises: Seated  Shoulder Rolls Backwards;10 reps (2 sets, before and after doing stretches)  Neck Exercises: Supine  Neck Retraction 5 reps;10 secs;Limitations  Neck Retraction Limitations cues on technique and hold time  Cervical Rotation Right;Left;Both;10 reps;Limitations  Cervical Rotation Limitations within pain free ranges  Moist Heat Therapy  Number Minutes Moist Heat 10 Minutes  Moist Heat Location Cervical  Electrical Stimulation  Electrical Stimulation Location posterior neck  Electrical Stimulation Action IFC to cervical spine for pain reduction/decr muscle tightness  Electrical Stimulation Parameters 10 mintues with intensity to tolerance  Electrical Stimulation Goals Pain  Manual Therapy  Manual Therapy Soft tissue mobilization;Manual Traction;Muscle Energy Technique  Soft tissue mobilization to upper trap, scalenes and rhomboid  Manual Traction gentle cervical traction for 30-45 sec holds   Muscle Energy Technique concurrent with gentle cervical traction- scapular depression x 10 reps, then scapular retraction x 10 reps.   Neck Exercises: Stretches  Upper Trapezius Stretch Limitations cues on ex form and hold times  Upper Trapezius Stretch 3 reps;30 seconds;Limitations  Other Neck Stretches lateral flexion stretch with opposite side arm anchored for 30 sec's x 3 reps each side.        PT Short Term Goals - 10/23/18 1030      PT SHORT TERM GOAL #1   Title  Pt will be I and compliant with HEP. 4 weeks 10/25/18    Baseline  no HEP until today; HEP initiated, reviewed and updated (10/16/18)    Status  Achieved      PT SHORT TERM GOAL #2   Title  Pt will relay at least 2 new ways to manage his neck  pain at home including stretching, heat/ice, TENS, gentle motion, relaxation techniques. 4 weeks 10/25/18    Baseline  10/16/18:  stretching helps; trying to use heat, TENS at home    Status  Achieved        PT Long Term Goals - 10/23/18 1548      PT LONG TERM GOAL #1   Title  Pt will improve neck ROM to [facility] (at least 75% ROM). 8 weeks 11/22/2018    Baseline  R rotation 45 degrees, extension 25 degrees 10/23/2018    Time  4    Status  On-going    Target Date  11/22/18      PT LONG TERM GOAL #2   Title  Pt will be able to perform his usual ADL's without increasing increment of pain 20%. 8 weeks 11/22/18    Baseline  9/10 pain; up to 10/10 pain 10/23/2018    Time  4    Status  On-going    Target Date  11/22/18      PT LONG TERM GOAL #3   Title  Pt will relay improved sleep quality due to less overall neck pain. 8 weeks 11/22/18    Baseline  not sleeping  well due to neck pain and discomfort; 10/23/2018-6 hours of sleep, but has difficulty finding good position    Time  4    Status  On-going    Target Date  11/22/18         LTG revision by PT: PT Long Term Goals - 12/03/18 2115      PT LONG TERM GOAL #1   Title  Pt will improve neck ROM by 10-12 degrees in all planes of movement    Baseline  R rotation 45 degrees, extension 25 degrees 10/23/2018    Time  6    Period  Weeks    Status  Revised    Target Date  01/17/19      PT LONG TERM GOAL #2   Title  Pt will be able to perform daily ADL's with pain remaining at </= 4/10    Baseline  can increase to 9/10 with activity, decreases to 6/10 with treatment    Time  6    Status  Revised    Target Date  01/17/19      PT LONG TERM GOAL #3   Title  Pt will relay improved sleep quality due to less overall neck pain    Baseline  not sleeping well due to neck pain and discomfort; 10/23/2018-6 hours of sleep, but has difficulty finding good position    Time  6    Period  Weeks    Status  Revised    Target Date  01/17/19      PT LONG TERM GOAL  #4   Title  Patient will demonstrate independence and compliance with cervical ROM and postural HEP    Baseline  Performs HEP about 50% of the time    Time  6    Period  Weeks    Status  New    Target Date  01/17/19      Rico Junker, PT, DPT 12/03/18    9:27 PM     11/28/18 0935  Plan  Clinical Impression Statement Today's skilled session continued to address cervical pain and muscle tightness. Pt did report decreased pain to 6/10 after session. The pt is slowly progressing toward goals and should benefit from continued PT to progress toward unmet goals.   Personal Factors and Comorbidities Comorbidity 1;Comorbidity 2;Past/Current Experience;Fitness  Comorbidities PMH: chronic LBP, bilat CTS, anx, bipolar,dep, a-fib, GERD, alcohol abuse, HTN,migranes, asthma,schizophrenia,sciatica, CVA  Examination-Activity Limitations Hygiene/Grooming;Reach Overhead  Examination-Participation Restrictions Driving;Meal Prep;Cleaning;Community Activity  Pt will benefit from skilled therapeutic intervention in order to improve on the following deficits Decreased activity tolerance;Decreased endurance;Decreased range of motion;Decreased strength;Hypomobility;Increased muscle spasms;Increased fascial restricitons;Pain;Postural dysfunction  Stability/Clinical Decision Making Evolving/Moderate complexity  Rehab Potential Fair  PT Frequency 2x / week  PT Duration 4 weeks (per recert 03/21/7653)  PT Treatment/Interventions Cryotherapy;Electrical Stimulation;Iontophoresis 4mg /ml Dexamethasone;Moist Heat;Traction;Ultrasound;Therapeutic activities;Therapeutic exercise;Neuromuscular re-education;Manual techniques;Passive range of motion;Dry needling;Taping;Spinal Manipulations;Joint Manipulations  PT Next Visit Plan Juliann Pulse - his cert ends 6/50 - will need date extension request after your visit with him.  If pain is flared up he responds well to heat and IFC TENS on larger estim machines.  Needs to continue to work  on posture.  May benefit from manual therapy but also responded well to gentle, progressive AROM  PT Home Exercise Plan [id number], UT and levator stretch, doorway stretch, cerv SNAG ext and rotation with towel, chin tucks, rows and ext with green band  Consulted and Agree with Plan of Care Patient     Plan  addendum by PT following treatment by PTA.  Plan was updated and date extension requested to complete remaining 6 visits approved by CCME.   11/28/18 0935  Plan  Clinical Impression Statement Today's skilled session continued to address cervical pain and muscle tightness. Pt did report decreased pain to 6/10 after session. The pt is making slow progress towards goals.  He experiences a decrease in symptoms within a session but demonstrates very little carryover session to session.  Pt has been about 50% compliant with exercises and is often limited due to pain in another area of his body as a result of various activities he performs each day.  Patient will benefit from continued PT visits in order to continue to address pain and transition to more independence with HEP and management of pain during daily activities.  Personal Factors and Comorbidities Comorbidity 1;Comorbidity 2;Past/Current Experience;Fitness  Comorbidities PMH: chronic LBP, bilat CTS, anx, bipolar,dep, a-fib, GERD, alcohol abuse, HTN,migranes, asthma,schizophrenia,sciatica, CVA  Examination-Activity Limitations Hygiene/Grooming;Reach Overhead  Examination-Participation Restrictions Driving;Meal Prep;Cleaning;Community Activity  Pt will benefit from skilled therapeutic intervention in order to improve on the following deficits Decreased activity tolerance;Decreased endurance;Decreased range of motion;Decreased strength;Hypomobility;Increased muscle spasms;Increased fascial restricitons;Pain;Postural dysfunction  Stability/Clinical Decision Making Evolving/Moderate complexity  Rehab Potential Fair  PT Frequency 1x / week  PT  Duration 6 weeks (per recert 0/03/4708)  PT Treatment/Interventions Cryotherapy;Electrical Stimulation;Iontophoresis 4mg /ml Dexamethasone;Moist Heat;Traction;Ultrasound;Therapeutic activities;Therapeutic exercise;Neuromuscular re-education;Manual techniques;Passive range of motion;Dry needling;Taping;Spinal Manipulations;Joint Manipulations  PT Next Visit Plan Juliann Pulse - his cert ends 6/28 - will need date extension request after your visit with him.  If pain is flared up he responds well to heat and IFC TENS on larger estim machines.  Needs to continue to work on posture.  May benefit from manual therapy but also responded well to gentle, progressive AROM  PT Home Exercise Plan [id number], UT and levator stretch, doorway stretch, cerv SNAG ext and rotation with towel, chin tucks, rows and ext with green band  Consulted and Agree with Plan of Care Patient   Rico Junker, PT, DPT 12/03/18    9:26 PM        Patient will benefit from skilled therapeutic intervention in order to improve the following deficits and impairments:  Decreased activity tolerance, Decreased endurance, Decreased range of motion, Decreased strength, Hypomobility, Increased muscle spasms, Increased fascial restricitons, Pain, Postural dysfunction  Visit Diagnosis: Cervicalgia  Abnormal posture     Problem List Patient Active Problem List   Diagnosis Date Noted  . Musculoskeletal chest pain 07/24/2018  . Asthma, mild intermittent 05/02/2018  . Allergic rhinitis caused by mold 05/02/2018  . Tobacco use 05/02/2018  . Bilateral carpal tunnel syndrome 01/10/2018  . Stroke ([place])   . Schizophrenia ([place])   . Migraine   . History of nuclear stress test   . History of alcohol abuse   . GERD (gastroesophageal reflux disease)   . Dysrhythmia   . Chronic lower back pain   . Anxiety   . Arthritis of knee, right 04/12/2017  . Bipolar disorder ([place]) 04/03/2017  . Lipoma of forehead 09/22/2016  . Trigger ring finger of  right hand 06/22/2016  . Paroxysmal atrial fibrillation ([facility])   . AF (atrial fibrillation) ([place]) 12/10/2015  . Eczema 07/10/2015  . History of CVA (cerebrovascular accident) 05/04/2015  . Tear of medial meniscus of right knee 03/18/2015  . Chronic pain of right knee 03/12/2015  . Other and unspecified hyperlipidemia 11/25/2013  . Nasal congestion 11/25/2013  . Sinusitis, chronic 05/09/2013  .  Chronic low back pain 10/12/2012  . Lumbar radiculopathy 10/12/2012  . Hypertension 10/12/2012  . Depression 10/12/2012  . Insomnia 10/12/2012    Willow Ora, PTA, [facility] [address] [place], [place] [address] [phone] 12/01/18, 7:55 PM   Rico Junker, PT, DPT 12/03/18    9:27 PM     Name: BENECIO KLUGER MRN: [id number] Date of Birth: 10-05-1960

## 2018-12-03 ENCOUNTER — Other Ambulatory Visit: Payer: Self-pay | Admitting: Family Medicine

## 2018-12-03 DIAGNOSIS — J302 Other seasonal allergic rhinitis: Secondary | ICD-10-CM

## 2018-12-07 ENCOUNTER — Other Ambulatory Visit: Payer: Self-pay

## 2018-12-07 ENCOUNTER — Ambulatory Visit: Payer: Medicaid Other | Admitting: Rehabilitative and Restorative Service Providers"

## 2018-12-07 DIAGNOSIS — M542 Cervicalgia: Secondary | ICD-10-CM | POA: Diagnosis not present

## 2018-12-07 DIAGNOSIS — R293 Abnormal posture: Secondary | ICD-10-CM

## 2018-12-07 NOTE — Therapy (Signed)
[facility] [address] [place], [place], [address] Phone: [phone]   Fax:  [phone]  Physical Therapy Treatment  Patient Details  Name: Jason Stokes MRN: [id number] Date of Birth: 05/06/1961 Referring Provider (PT): Kathrynn Ducking, MD   Encounter Date: 12/07/2018  PT End of Session - 12/07/18 1057    Visit Number  7    Number of Visits  12    Date for PT Re-Evaluation  01/17/19    Authorization Type  6 visits 5/22-01/17/2019    Authorization - Visit Number  1    Authorization - Number of Visits  6    PT Start Time  0940    PT Stop Time  1020    PT Time Calculation (min)  40 min    Activity Tolerance  Patient tolerated treatment well   Pt reports decreased pain to 6/10 at end of session   Behavior During Therapy  [facility] for tasks assessed/performed       Past Medical History:  Diagnosis Date  . Anxiety   . Bilateral carpal tunnel syndrome 01/10/2018  . Bipolar disorder ([place])   . Chronic lower back pain   . Depression   . Dysrhythmia    a-fib  . GERD (gastroesophageal reflux disease)   . History of alcohol abuse   . History of nuclear stress test    Myoview 10/16: EF 50%, diaphragmatic attenuation, no ischemia, low risk  . Hypertension   . Migraine 2012-2014  . Moderate persistent asthma with acute exacerbation 05/02/2018  . PAF (paroxysmal atrial fibrillation) ([place]) 03/27/2015   a. Myoview neg for ischemia >> Flecainide started 10/16 >> FU ETT   . Schizophrenia ([place])   . Sciatica neuralgia   . Small vessel disease ([place])    Right basal ganglia stroke  . Stroke [facility]) "between 2012-2014"   residual "AF" (09/22/2015)    Past Surgical History:  Procedure Laterality Date  . ATRIAL FIBRILLATION ABLATION  09/22/2015  . CYST EXCISION  1996-97   surgery back of head   . ELECTROPHYSIOLOGIC STUDY N/A 09/22/2015   Procedure: Atrial Fibrillation Ablation;  Surgeon: Will [name], MD;  Location: [place] [facility];  Service: Cardiovascular;  Laterality: N/A;  . ELECTROPHYSIOLOGIC STUDY N/A 12/10/2015   Procedure: Atrial Fibrillation Ablation;  Surgeon: Will [name], MD;  Location: [place] [facility];  Service: Cardiovascular;  Laterality: N/A;  . ELECTROPHYSIOLOGIC STUDY N/A 12/11/2015   Procedure: Cardioversion;  Surgeon: Will [name], MD;  Location: [place] [facility];  Service: Cardiovascular;  Laterality: N/A;  . EXCISION MASS HEAD N/A 01/06/2017   Procedure: EXCISION MASS FOREHEAD;  Surgeon: Irene Limbo, MD;  Location: [place];  Service: Plastics;  Laterality: N/A;  . GANGLION CYST EXCISION Left   . INTERCOSTAL NERVE BLOCK  2005  . KNEE ARTHROSCOPY Right 2016    There were no vitals filed for this visit.  Subjective Assessment - 12/07/18 0941    Subjective  The patient has bilateral neck pain worse on the left side.  He reports he cannot turn his head quickly.  He requests to be near a wall plug to get electrical stimulation.  The pain goes up the back of his head.    Pertinent History  PMH: chronic LBP, bilat CTS, anx, bipolar,dep, a-fib, GERD, alcohol abuse, HTN,migranes, asthma,schizophrenia,sciatica, CVA    Patient Stated Goals  get the pain down and move my neck better    Currently in Pain?  Yes    Pain Score  9     Pain Location  Neck    Pain Orientation  Left    Pain Descriptors / Indicators  Throbbing    Pain Type  Chronic pain    Pain Onset  More than a month ago    Pain Frequency  Intermittent    Aggravating Factors   rotation, movements    Pain Relieving Factors  pain meds    Effect of Pain on Daily Activities  also had chronic back pain; R leg is "restless leg"      CLINIC OPERATION CHANGES: [facility] is operating at a low capacity due to COVID-19.  The patient was brought into the clinic for evaluation and/or treatment following universal masking by staff, social distancing, and <10 people in the  clinic.  The patient's COVID risk of complications score is 4.    [facility] PT Assessment - 12/07/18 1012      AROM   AROM Assessment Site  Cervical    Cervical - Right Rotation  55    Cervical - Left Rotation  46                   [facility] Adult PT Treatment/Exercise - 12/07/18 0944      Exercises   Exercises  Neck;Shoulder      Neck Exercises: Seated   Other Seated Exercise  Shoulder rolls, scapular retraction seated.  Attempted seated horizontal shoulder abduction *patient elevates shoulders to compensate.      Neck Exercises: Supine   Cervical Isometrics  Left lateral flexion;Right lateral flexion    Cervical Isometrics Limitations  manual resistance    Neck Retraction  5 reps    Neck Retraction Limitations  cues on technique provided manuallly and through demonstration    Cervical Rotation  Right;Left      Neck Exercises: Sidelying   Lateral Flexion  Left    Lateral Flexion Limitations  in right sidelying had patient lift head against gravity x 5 reps    Other Sidelying Exercise  Sidelying with arms in 90 degrees of flexion, performed upper trunk rotation with shoulder abduction to "open the book" x 10 reps for chest stretch and upper back stretch.      Manual Therapy   Manual Therapy  Soft tissue mobilization;Manual Traction;Muscle Energy Technique;Joint mobilization    Manual therapy comments  Supine position and sidelying    Joint Mobilization  lateral glides beginning with grade I-II to determine patient tolerance.  He c/o pain L side with R>L glides and R side with L>R glide.    Soft tissue mobilization  emphasis on L upper trap, L scalenes, L parascapular musculature, R upper trap, suboccipital musculature.    Manual Traction  gentle cervical traction x 20 second holds               PT Short Term Goals - 12/03/18 2115      PT SHORT TERM GOAL #1   Title  = LTG        PT Long Term Goals - 12/03/18 2115      PT LONG TERM GOAL #1   Title  Pt will  improve neck ROM by 10-12 degrees in all planes of movement    Baseline  R rotation 45 degrees, extension 25 degrees 10/23/2018    Time  6    Period  Weeks    Status  Revised    Target Date  01/17/19  PT LONG TERM GOAL #2   Title  Pt will be able to perform daily ADL's with pain remaining at </= 4/10    Baseline  can increase to 9/10 with activity, decreases to 6/10 with treatment    Time  6    Status  Revised    Target Date  01/17/19      PT LONG TERM GOAL #3   Title  Pt will relay improved sleep quality due to less overall neck pain    Baseline  not sleeping well due to neck pain and discomfort; 10/23/2018-6 hours of sleep, but has difficulty finding good position    Time  6    Period  Weeks    Status  Revised    Target Date  01/17/19      PT LONG TERM GOAL #4   Title  Patient will demonstrate independence and compliance with cervical ROM and postural HEP    Baseline  Performs HEP about 50% of the time    Time  6    Period  Weeks    Status  New    Target Date  01/17/19            Plan - 12/07/18 1004    Clinical Impression Statement  Although the patient presents with 8/10 pain and requests e-stim, PT was able to get neck pain down to 6/10 (same as after modalities) with exercise and manual techniques.  PT and patient discussed him having a more active roll in stretching, posture/positioning to improve long term outcomes.       PT Treatment/Interventions  Cryotherapy;Electrical Stimulation;Iontophoresis 4mg /ml Dexamethasone;Moist Heat;Traction;Ultrasound;Therapeutic activities;Therapeutic exercise;Neuromuscular re-education;Manual techniques;Passive range of motion;Dry needling;Taping;Spinal Manipulations;Joint Manipulations    PT Next Visit Plan  *Margreta Journey will need to send cert order update for 6/6-7/2.  Warm up with arm bike to tolerance to get increased mobility/strength in shoulders.  Postural strengthening, shoulder strengthening (compensates for shoulder weakness by  elevating shoulders), scapular depression.  General spinal mobility stretching, door frame stretches for UEs to tolerance for chest opening, thoracic self mobilization.    Consulted and Agree with Plan of Care  Patient       Patient will benefit from skilled therapeutic intervention in order to improve the following deficits and impairments:  Decreased activity tolerance, Decreased endurance, Decreased range of motion, Decreased strength, Hypomobility, Increased muscle spasms, Increased fascial restricitons, Pain, Postural dysfunction  Visit Diagnosis: Cervicalgia  Abnormal posture     Problem List Patient Active Problem List   Diagnosis Date Noted  . Musculoskeletal chest pain 07/24/2018  . Asthma, mild intermittent 05/02/2018  . Allergic rhinitis caused by mold 05/02/2018  . Tobacco use 05/02/2018  . Bilateral carpal tunnel syndrome 01/10/2018  . Stroke ([place])   . Schizophrenia ([place])   . Migraine   . History of nuclear stress test   . History of alcohol abuse   . GERD (gastroesophageal reflux disease)   . Dysrhythmia   . Chronic lower back pain   . Anxiety   . Arthritis of knee, right 04/12/2017  . Bipolar disorder ([place]) 04/03/2017  . Lipoma of forehead 09/22/2016  . Trigger ring finger of right hand 06/22/2016  . Paroxysmal atrial fibrillation ([facility])   . AF (atrial fibrillation) ([place]) 12/10/2015  . Eczema 07/10/2015  . History of CVA (cerebrovascular accident) 05/04/2015  . Tear of medial meniscus of right knee 03/18/2015  . Chronic pain of right knee 03/12/2015  . Other and unspecified hyperlipidemia 11/25/2013  . Nasal congestion 11/25/2013  .  Sinusitis, chronic 05/09/2013  . Chronic low back pain 10/12/2012  . Lumbar radiculopathy 10/12/2012  . Hypertension 10/12/2012  . Depression 10/12/2012  . Insomnia 10/12/2012    Tane Biegler, PT 12/07/2018, 4:37 PM  [facility] [address] [place], [place], [address] Phone: [phone]   Fax:  [phone]  Name: Jason Stokes MRN: [id number] Date of Birth: 1960/07/20

## 2018-12-12 ENCOUNTER — Ambulatory Visit: Payer: Medicaid Other | Admitting: Physical Therapy

## 2018-12-12 ENCOUNTER — Encounter: Payer: Self-pay | Admitting: Physical Therapy

## 2018-12-12 ENCOUNTER — Other Ambulatory Visit: Payer: Self-pay

## 2018-12-12 DIAGNOSIS — R293 Abnormal posture: Secondary | ICD-10-CM

## 2018-12-12 DIAGNOSIS — M542 Cervicalgia: Secondary | ICD-10-CM | POA: Diagnosis not present

## 2018-12-12 NOTE — Therapy (Signed)
[facility] [address] [place], [place], [address] Phone: [phone]   Fax:  [phone]  Physical Therapy Treatment  Patient Details  Name: Jason Stokes MRN: [id number] Date of Birth: 27-May-1961 Referring Provider (PT): Kathrynn Ducking, MD   Encounter Date: 12/12/2018  PT End of Session - 12/12/18 0935    Visit Number  8    Number of Visits  12    Date for PT Re-Evaluation  01/17/19    Authorization Type  6 visits 5/22-01/17/2019    Authorization - Visit Number  2    Authorization - Number of Visits  6    PT Start Time  0931    PT Stop Time  1015    PT Time Calculation (min)  44 min    Activity Tolerance  Patient tolerated treatment well   Pt reports decreased pain to 6-7/10 at end of session   Behavior During Therapy  [facility] for tasks assessed/performed       Past Medical History:  Diagnosis Date  . Anxiety   . Bilateral carpal tunnel syndrome 01/10/2018  . Bipolar disorder ([place])   . Chronic lower back pain   . Depression   . Dysrhythmia    a-fib  . GERD (gastroesophageal reflux disease)   . History of alcohol abuse   . History of nuclear stress test    Myoview 10/16: EF 50%, diaphragmatic attenuation, no ischemia, low risk  . Hypertension   . Migraine 2012-2014  . Moderate persistent asthma with acute exacerbation 05/02/2018  . PAF (paroxysmal atrial fibrillation) ([place]) 03/27/2015   a. Myoview neg for ischemia >> Flecainide started 10/16 >> FU ETT   . Schizophrenia ([place])   . Sciatica neuralgia   . Small vessel disease ([place])    Right basal ganglia stroke  . Stroke [facility]) "between 2012-2014"   residual "AF" (09/22/2015)    Past Surgical History:  Procedure Laterality Date  . ATRIAL FIBRILLATION ABLATION  09/22/2015  . CYST EXCISION  1996-97   surgery back of head   . ELECTROPHYSIOLOGIC STUDY N/A 09/22/2015   Procedure: Atrial Fibrillation Ablation;  Surgeon: Will [name], MD;  Location: [place] [facility];  Service: Cardiovascular;  Laterality: N/A;  . ELECTROPHYSIOLOGIC STUDY N/A 12/10/2015   Procedure: Atrial Fibrillation Ablation;  Surgeon: Will [name], MD;  Location: [place] [facility];  Service: Cardiovascular;  Laterality: N/A;  . ELECTROPHYSIOLOGIC STUDY N/A 12/11/2015   Procedure: Cardioversion;  Surgeon: Will [name], MD;  Location: [place] [facility];  Service: Cardiovascular;  Laterality: N/A;  . EXCISION MASS HEAD N/A 01/06/2017   Procedure: EXCISION MASS FOREHEAD;  Surgeon: Irene Limbo, MD;  Location: [place];  Service: Plastics;  Laterality: N/A;  . GANGLION CYST EXCISION Left   . INTERCOSTAL NERVE BLOCK  2005  . KNEE ARTHROSCOPY Right 2016    There were no vitals filed for this visit.  Subjective Assessment - 12/12/18 0933    Subjective  Reports hurting and stiff "all over". No falls. Does plan to go to [place] to see grandkids next Thursday,     Pertinent History  PMH: chronic LBP, bilat CTS, anx, bipolar,dep, a-fib, GERD, alcohol abuse, HTN,migranes, asthma,schizophrenia,sciatica, CVA    Limitations  Sitting;Reading;Lifting;Standing;Walking    Diagnostic tests  Abnormal MRI scan of cervical spine showing prominent spondylitic change at C3-4 with moderate left sided foraminal narrowing. C4-5 and C5-6 also shows similar but milder changes. Compared with previous MRI from 10/08/2014  these changes appear to have progressed    Patient Stated Goals  get the pain down and move my neck better    Currently in Pain?  Yes    Pain Score  10-Worst pain ever    Pain Location  Generalized   neck and back   Pain Descriptors / Indicators  Dull;Aching    Pain Type  Chronic pain    Pain Frequency  Intermittent    Aggravating Factors   rotation, movements, rainy/cold weather     Pain Relieving Factors  pain meds          [facility] Adult PT Treatment/Exercise - 12/12/18 1136      Neck Exercises: Machines for Strengthening   Other Machines  for Strengthening  Scifit with UE/LE's at level 5.0 with goal >/=50 rpm for strengthening and activity tolerance concurrent with moist hot pack to lower back.       Shoulder Exercises: Seated   Horizontal ABduction  AROM;Strengthening;Both;10 reps;Theraband;Limitations    Theraband Level (Shoulder Horizontal ABduction)  Level 4 (Blue)    Horizontal ABduction Limitations  cues to not elevate shoulders and for slow, controlled movements    Diagonals  AROM;Strengthening;Both;10 reps;Theraband;Limitations    Theraband Level (Shoulder Diagonals)  Level 4 (Blue)    Diagonals Limitations  alternating diagonals for 10 reps each side with cues on form and slow, controlled movements     Other Seated Exercises  seated shoulder rolls backwards before and after stretches/band ex's with cues for slow, smaller movements      Moist Heat Therapy   Number Minutes Moist Heat  10 Minutes    Moist Heat Location  Other (comment)   x10 to back with scifit, x10 to cervical with manual therapy     Manual Therapy   Manual Therapy  Soft tissue mobilization;Manual Traction;Muscle Energy Technique    Manual therapy comments  supine concurrent with moist hot pack along bil scapular muscles/lower cervical muscles- all manual therapy performed for decreased pain and muscle tightness.     Soft tissue mobilization  bil suboccipitals, upper traps, left scalenes    Manual Traction  gentle cervical traction x 20 second holds, progressed to longer holds with gentle overpressure for scapular depression for increased muscular stretching (30-40 sec holds for 3-4 reps each side)    Muscle Energy Technique  concurrent with gentle cervical traction- scapular depression x 10 reps, then scapular retraction x 10 reps.       Neck Exercises: Stretches   Upper Trapezius Stretch  3 reps;30 seconds;Limitations    Upper Trapezius Stretch Limitations  cues on ex form and hold times    Corner Stretch  4 reps;30 seconds;Limitations    Corner  Stretch Limitations  cues on form and hold times (pt attempting to stop at just 8-10 sec's)         PT Short Term Goals - 12/03/18 2115      PT SHORT TERM GOAL #1   Title  = LTG        PT Long Term Goals - 12/03/18 2115      PT LONG TERM GOAL #1   Title  Pt will improve neck ROM by 10-12 degrees in all planes of movement    Baseline  R rotation 45 degrees, extension 25 degrees 10/23/2018    Time  6    Period  Weeks    Status  Revised    Target Date  01/17/19      PT LONG TERM GOAL #2  Title  Pt will be able to perform daily ADL's with pain remaining at </= 4/10    Baseline  can increase to 9/10 with activity, decreases to 6/10 with treatment    Time  6    Status  Revised    Target Date  01/17/19      PT LONG TERM GOAL #3   Title  Pt will relay improved sleep quality due to less overall neck pain    Baseline  not sleeping well due to neck pain and discomfort; 10/23/2018-6 hours of sleep, but has difficulty finding good position    Time  6    Period  Weeks    Status  Revised    Target Date  01/17/19      PT LONG TERM GOAL #4   Title  Patient will demonstrate independence and compliance with cervical ROM and postural HEP    Baseline  Performs HEP about 50% of the time    Time  6    Period  Weeks    Status  New    Target Date  01/17/19            Plan - 12/12/18 0936    Clinical Impression Statement  Today's skilled session once again focused on use of manual therapy and gentle ex's for decreased muscle tightness and pain. Pt continues to need cues to not elevate shoulders with ex's/stretches as compensatory motions. Pt did rate his pain as less, 6-7/10, after session. The pt should benefit from continued PT to progress toward unmet goals.     PT Treatment/Interventions  Cryotherapy;Electrical Stimulation;Iontophoresis 4mg /ml Dexamethasone;Moist Heat;Traction;Ultrasound;Therapeutic activities;Therapeutic exercise;Neuromuscular re-education;Manual techniques;Passive  range of motion;Dry needling;Taping;Spinal Manipulations;Joint Manipulations    PT Next Visit Plan   Postural strengthening, shoulder strengthening (compensates for shoulder weakness by elevating shoulders), scapular depression.  General spinal mobility stretching, door frame stretches for UEs to tolerance for chest opening, thoracic self mobilization.    PT Home Exercise Plan  [id number], [place] and levator stretch, doorway stretch, cerv SNAG ext and rotation with towel, chin tucks, rows and ext with green band    Consulted and Agree with Plan of Care  Patient       Patient will benefit from skilled therapeutic intervention in order to improve the following deficits and impairments:  Decreased activity tolerance, Decreased endurance, Decreased range of motion, Decreased strength, Hypomobility, Increased muscle spasms, Increased fascial restricitons, Pain, Postural dysfunction  Visit Diagnosis: Cervicalgia  Abnormal posture     Problem List Patient Active Problem List   Diagnosis Date Noted  . Musculoskeletal chest pain 07/24/2018  . Asthma, mild intermittent 05/02/2018  . Allergic rhinitis caused by mold 05/02/2018  . Tobacco use 05/02/2018  . Bilateral carpal tunnel syndrome 01/10/2018  . Stroke ([place])   . Schizophrenia ([place])   . Migraine   . History of nuclear stress test   . History of alcohol abuse   . GERD (gastroesophageal reflux disease)   . Dysrhythmia   . Chronic lower back pain   . Anxiety   . Arthritis of knee, right 04/12/2017  . Bipolar disorder ([place]) 04/03/2017  . Lipoma of forehead 09/22/2016  . Trigger ring finger of right hand 06/22/2016  . Paroxysmal atrial fibrillation ([facility])   . AF (atrial fibrillation) ([place]) 12/10/2015  . Eczema 07/10/2015  . History of CVA (cerebrovascular accident) 05/04/2015  . Tear of medial meniscus of right knee 03/18/2015  . Chronic pain of right knee 03/12/2015  . Other and unspecified hyperlipidemia 11/25/2013  .  Nasal congestion  11/25/2013  . Sinusitis, chronic 05/09/2013  . Chronic low back pain 10/12/2012  . Lumbar radiculopathy 10/12/2012  . Hypertension 10/12/2012  . Depression 10/12/2012  . Insomnia 10/12/2012    Willow Ora, PTA, [facility] [address], [place], [place] [address] [phone] 12/12/18, 12:03 PM   Name: Jason Stokes MRN: [id number] Date of Birth: Jul 06, 1961

## 2018-12-14 ENCOUNTER — Ambulatory Visit (INDEPENDENT_AMBULATORY_CARE_PROVIDER_SITE_OTHER): Payer: Medicaid Other | Admitting: *Deleted

## 2018-12-14 DIAGNOSIS — J309 Allergic rhinitis, unspecified: Secondary | ICD-10-CM

## 2018-12-19 ENCOUNTER — Encounter: Payer: Self-pay | Admitting: Rehabilitative and Restorative Service Providers"

## 2018-12-19 ENCOUNTER — Other Ambulatory Visit: Payer: Self-pay

## 2018-12-19 ENCOUNTER — Ambulatory Visit: Payer: Medicaid Other | Attending: Neurology | Admitting: Rehabilitative and Restorative Service Providers"

## 2018-12-19 DIAGNOSIS — M542 Cervicalgia: Secondary | ICD-10-CM | POA: Insufficient documentation

## 2018-12-19 DIAGNOSIS — R293 Abnormal posture: Secondary | ICD-10-CM | POA: Diagnosis present

## 2018-12-19 NOTE — Patient Instructions (Signed)
Access Code: [email or web address]  URL: [email or web address]  Date: 12/19/2018  Prepared by: Rudell Cobb   Exercises Seated Cervical Flexion AROM - 10 reps - 3 sets - 2-3x daily - 7x weekly Upper Cervical Rotation SNAG with Strap - 10 reps - 3 sets - 1x daily - 7x weekly Seated Bilateral Shoulder External Rotation with Resistance - 10 reps - 2 sets - 3-5 seconds hold - 2x daily - 7x weekly Supine Thoracic Mobilization Towel Roll Vertical with Arm Stretch - 1 reps - 1 sets - 60 minutes hold - 2x daily - 7x weekly Seated Shoulder Horizontal Abduction - Thumbs Up - 10 reps - 1 sets - 3 hold - 2x daily - 7x weekly

## 2018-12-19 NOTE — Therapy (Signed)
[facility] [address] [place], [place], [address] Phone: [phone]   Fax:  [phone]  Physical Therapy Treatment  Patient Details  Name: Jason Stokes MRN: [id number] Date of Birth: September 26, 1960 Referring Provider (PT): Kathrynn Ducking, MD   Encounter Date: 12/19/2018  PT End of Session - 12/19/18 1541    Visit Number  9    Number of Visits  12    Date for PT Re-Evaluation  01/17/19    Authorization Type  6 visits 5/22-01/17/2019    Authorization - Visit Number  3    Authorization - Number of Visits  6    PT Start Time  0908    PT Stop Time  0948    PT Time Calculation (min)  40 min    Activity Tolerance  Patient tolerated treatment well   Pt reports decreased pain to 6-7/10 at end of session   Behavior During Therapy  [facility] for tasks assessed/performed       Past Medical History:  Diagnosis Date  . Anxiety   . Bilateral carpal tunnel syndrome 01/10/2018  . Bipolar disorder ([place])   . Chronic lower back pain   . Depression   . Dysrhythmia    a-fib  . GERD (gastroesophageal reflux disease)   . History of alcohol abuse   . History of nuclear stress test    Myoview 10/16: EF 50%, diaphragmatic attenuation, no ischemia, low risk  . Hypertension   . Migraine 2012-2014  . Moderate persistent asthma with acute exacerbation 05/02/2018  . PAF (paroxysmal atrial fibrillation) ([place]) 03/27/2015   a. Myoview neg for ischemia >> Flecainide started 10/16 >> FU ETT   . Schizophrenia ([place])   . Sciatica neuralgia   . Small vessel disease ([place])    Right basal ganglia stroke  . Stroke [facility]) "between 2012-2014"   residual "AF" (09/22/2015)    Past Surgical History:  Procedure Laterality Date  . ATRIAL FIBRILLATION ABLATION  09/22/2015  . CYST EXCISION  1996-97   surgery back of head   . ELECTROPHYSIOLOGIC STUDY N/A 09/22/2015   Procedure: Atrial Fibrillation Ablation;  Surgeon: Will [name], MD;  Location: [place] [facility];  Service: Cardiovascular;  Laterality: N/A;  . ELECTROPHYSIOLOGIC STUDY N/A 12/10/2015   Procedure: Atrial Fibrillation Ablation;  Surgeon: Will [name], MD;  Location: [place] [facility];  Service: Cardiovascular;  Laterality: N/A;  . ELECTROPHYSIOLOGIC STUDY N/A 12/11/2015   Procedure: Cardioversion;  Surgeon: Will [name], MD;  Location: [place] [facility];  Service: Cardiovascular;  Laterality: N/A;  . EXCISION MASS HEAD N/A 01/06/2017   Procedure: EXCISION MASS FOREHEAD;  Surgeon: Irene Limbo, MD;  Location: [place];  Service: Plastics;  Laterality: N/A;  . GANGLION CYST EXCISION Left   . INTERCOSTAL NERVE BLOCK  2005  . KNEE ARTHROSCOPY Right 2016    There were no vitals filed for this visit.  Subjective Assessment - 12/19/18 0908    Subjective  The patient reports his sciatic nerve pain is "over ten."  PT inquired about plans to drive to [place] tomorrow and he reports "I've got meds for that."    He cannot tell if his neck is sore b/c his low back hurts so bad today.    Pertinent History  PMH: chronic LBP, bilat CTS, anx, bipolar,dep, a-fib, GERD, alcohol abuse, HTN,migranes, asthma,schizophrenia,sciatica, CVA    Diagnostic tests  Abnormal MRI scan of cervical spine showing prominent spondylitic change at C3-4  with moderate left sided foraminal narrowing. C4-5 and C5-6 also shows similar but milder changes. Compared with previous MRI from 10/08/2014 these changes appear to have progressed    Patient Stated Goals  get the pain down and move my neck better    Currently in Pain?  Yes    Pain Score  10-Worst pain ever    Pain Location  Back    Pain Orientation  Right;Left   started with the right and now it is on the left   Pain Descriptors / Indicators  Shooting;Sharp    Pain Type  Chronic pain    Pain Onset  More than a month ago    Pain Frequency  Intermittent    Aggravating Factors   worse when sitting or lying down    Pain  Relieving Factors  gentle movements, pain meds    Multiple Pain Sites  Yes    Pain Score  8    Pain Location  Neck    Pain Orientation  Right    Pain Descriptors / Indicators  Aching    Pain Type  Chronic pain    Pain Onset  More than a month ago    Pain Frequency  Intermittent    Aggravating Factors   unsure    Pain Relieving Factors  pain meds       CLINIC OPERATION CHANGES: Outpatient Neuro Rehab is open at lower capacity following universal masking, social distancing, and patient screening.  The patient's COVID risk of complications score is 4.   [facility] PT Assessment - 12/19/18 0920      AROM   Cervical - Right Rotation  60    Cervical - Left Rotation  68                   [facility] Adult PT Treatment/Exercise - 12/19/18 0915      Self-Care   Self-Care  Other Self-Care Comments    Other Self-Care Comments   Began to discuss h/o chronic pain and introduce pain science concepts discussing overall/ global health as ways to reduce pain level.  Patient always arrives with at least 8/10 pain.  We discussed instead of decreasing pain to 0/10 having stretches, a wellness routine to begin to be able to impact pain to reduce it (as it reduces during our therapy session to 6/10 neck and 8/10 low back).  Discussed gentle movement, regular stretches, and postural strengthening.      Exercises   Exercises  Other Exercises;Neck;Shoulder      Neck Exercises: Machines for Strengthening   UBE (Upper Arm Bike)  2 minutes forward/ 2 minutes backward to tolerance.      Neck Exercises: Theraband   Shoulder External Rotation  15 reps    Shoulder External Rotation Limitations  red theraband with cues on shoulder depression      Neck Exercises: Standing   Other Standing Exercises  Attempted door frame stretch standing, but patient c/o worsening pain in bilateral rotator cuffs.       Neck Exercises: Seated   Neck Retraction  5 reps    Neck Retraction Limitations  with cues      Neck  Exercises: Supine   Neck Retraction  5 reps    Neck Retraction Limitations  cues on technique provided manuallly and through demonstration    Cervical Rotation  Right;Left;5 reps    Other Supine Exercise  towel roll stretch supine for chest opening.      Manual Therapy  Manual Therapy  Soft tissue mobilization;Manual Traction;Muscle Energy Technique    Manual therapy comments  supine    Soft tissue mobilization  bilateral upper trap and scalenes    Manual Traction  gentle cervical traction x 20 second holds, progressed to longer holds with gentle overpressure for scapular depression for increased muscular stretching (30-40 sec holds for 3-4 reps each side)             PT Education - 12/19/18 1540    Education Details  Updated HEP adding strengthening.    Person(s) Educated  Patient    Methods  Explanation    Comprehension  Verbalized understanding       PT Short Term Goals - 12/03/18 2115      PT SHORT TERM GOAL #1   Title  = LTG        PT Long Term Goals - 12/03/18 2115      PT LONG TERM GOAL #1   Title  Pt will improve neck ROM by 10-12 degrees in all planes of movement    Baseline  R rotation 45 degrees, extension 25 degrees 10/23/2018    Time  6    Period  Weeks    Status  Revised    Target Date  01/17/19      PT LONG TERM GOAL #2   Title  Pt will be able to perform daily ADL's with pain remaining at </= 4/10    Baseline  can increase to 9/10 with activity, decreases to 6/10 with treatment    Time  6    Status  Revised    Target Date  01/17/19      PT LONG TERM GOAL #3   Title  Pt will relay improved sleep quality due to less overall neck pain    Baseline  not sleeping well due to neck pain and discomfort; 10/23/2018-6 hours of sleep, but has difficulty finding good position    Time  6    Period  Weeks    Status  Revised    Target Date  01/17/19      PT LONG TERM GOAL #4   Title  Patient will demonstrate independence and compliance with cervical ROM and  postural HEP    Baseline  Performs HEP about 50% of the time    Time  6    Period  Weeks    Status  New    Target Date  01/17/19            Plan - 12/19/18 1546    Clinical Impression Statement  The patient is continuing to benefit from physical therapy with focus of intervention on postural strengthening, UE strengthening to reduce compensatory shoulder elevation, and establishing HEP for future mgmt of chronic pain.    PT Treatment/Interventions  Cryotherapy;Electrical Stimulation;Iontophoresis 4mg /ml Dexamethasone;Moist Heat;Traction;Ultrasound;Therapeutic activities;Therapeutic exercise;Neuromuscular re-education;Manual techniques;Passive range of motion;Dry needling;Taping;Spinal Manipulations;Joint Manipulations    PT Next Visit Plan   Postural strengthening, shoulder strengthening (compensates for shoulder weakness by elevating shoulders), scapular depression.  General spinal mobility stretching, door frame stretches for UEs to tolerance for chest opening, thoracic self mobilization.    Consulted and Agree with Plan of Care  Patient       Patient will benefit from skilled therapeutic intervention in order to improve the following deficits and impairments:  Decreased activity tolerance, Decreased endurance, Decreased range of motion, Decreased strength, Hypomobility, Increased muscle spasms, Increased fascial restricitons, Pain, Postural dysfunction  Visit Diagnosis: Cervicalgia  Abnormal posture  Problem List Patient Active Problem List   Diagnosis Date Noted  . Musculoskeletal chest pain 07/24/2018  . Asthma, mild intermittent 05/02/2018  . Allergic rhinitis caused by mold 05/02/2018  . Tobacco use 05/02/2018  . Bilateral carpal tunnel syndrome 01/10/2018  . Stroke ([place])   . Schizophrenia ([place])   . Migraine   . History of nuclear stress test   . History of alcohol abuse   . GERD (gastroesophageal reflux disease)   . Dysrhythmia   . Chronic lower back pain    . Anxiety   . Arthritis of knee, right 04/12/2017  . Bipolar disorder ([place]) 04/03/2017  . Lipoma of forehead 09/22/2016  . Trigger ring finger of right hand 06/22/2016  . Paroxysmal atrial fibrillation ([facility])   . AF (atrial fibrillation) ([place]) 12/10/2015  . Eczema 07/10/2015  . History of CVA (cerebrovascular accident) 05/04/2015  . Tear of medial meniscus of right knee 03/18/2015  . Chronic pain of right knee 03/12/2015  . Other and unspecified hyperlipidemia 11/25/2013  . Nasal congestion 11/25/2013  . Sinusitis, chronic 05/09/2013  . Chronic low back pain 10/12/2012  . Lumbar radiculopathy 10/12/2012  . Hypertension 10/12/2012  . Depression 10/12/2012  . Insomnia 10/12/2012    Nekeisha Aure, PT 12/19/2018, 3:47 PM  [facility] [address] [place], [place], [address] Phone: [phone]   Fax:  [phone]  Name: AMARION PORTELL MRN: [id number] Date of Birth: December 28, 1960

## 2018-12-25 ENCOUNTER — Ambulatory Visit: Payer: Medicaid Other | Admitting: Critical Care Medicine

## 2018-12-25 NOTE — Progress Notes (Deleted)
Subjective:    Patient ID: Jason Stokes, male    DOB: 12-21-1960, 58 y.o.   MRN: [id number]   57 y.o.M with history of severe persistent asthma and chronic allergic rhinitis and recurrent sinusitis.  This is a telephone visit and I confirmed the patient was on the phone by himself  The patient notes with the increase in pollen he is having increased sinus pressure and yellow mucus out of the nose.  He has increased soreness in the throat.  He is not short of breath mostly goes out of doors.  The patient notes no fever.  There is no cough.  The patient is on Astelin nasal spray along with Flonase and Zyrtec and also antihistamine eyedrops.  Patient does have the albuterol inhaler which he uses as needed.  The patient notes increasing postnasal drainage.  He states the mold situation in his rental house has been mitigated.  Shortness of Breath  This is a recurrent problem. The current episode started more than 1 year ago. The problem occurs daily (dyspnea with exertion, 172ft or up steps). The problem has been gradually worsening. Associated symptoms include headaches, rhinorrhea, a sore throat and wheezing. Pertinent negatives include no chest pain, ear pain, fever, hemoptysis, leg pain, leg swelling, orthopnea, PND, rash, sputum production or swollen glands. The symptoms are aggravated by any activity, exercise, pollens, odors, fumes, lying flat and animal exposure. Associated symptoms comments: Mucus is foamy, and foamy   Chest is tight on the left side, notes wheezing esp at night. He has tried beta agonist inhalers (using SABA frequently) for the symptoms. The treatment provided moderate relief. His past medical history is significant for allergies. There is no history of asthma, CAD, DVT, a heart failure, PE or pneumonia. (Is on immunotherapy injections x 1 month, hx of afib)    Past Medical History: Diagnosis Date . Anxiety  . Bilateral carpal tunnel syndrome 01/10/2018 . Bipolar  disorder ([place])  . Chronic lower back pain  . Depression  . Dysrhythmia   a-fib . GERD (gastroesophageal reflux disease)  . History of alcohol abuse  . History of nuclear stress test   Myoview 10/16: EF 50%, diaphragmatic attenuation, no ischemia, low risk . Hypertension  . Migraine 2012-2014 . Moderate persistent asthma with acute exacerbation 05/02/2018 . PAF (paroxysmal atrial fibrillation) ([place]) 03/27/2015  a. Myoview neg for ischemia >> Flecainide started 10/16 >> FU ETT  . Schizophrenia ([place])  . Sciatica neuralgia  . Small vessel disease ([place])   Right basal ganglia stroke . Stroke [facility]) "between 2012-2014"  residual "AF" (09/22/2015)    Family History Problem Relation Age of Onset . Heart disease Father  . Schizophrenia Sister     Social History  Socioeconomic History . Marital status: Single   Spouse name: [name] . Number of children: 3 . Years of education: HS . Highest education level: Not on file Occupational History   Comment: disabled Social Needs . Financial resource strain: Not on file . Food insecurity:   Worry: Not on file   Inability: Not on file . Transportation needs:   Medical: Not on file   Non-medical: Not on file Tobacco Use . Smoking status: Current Some Day Smoker   Packs/day: 0.10   Years: 29.00   Pack years: 2.90   Types: Cigarettes . Smokeless tobacco: Never Used Substance and Sexual Activity . Alcohol use: Yes   Alcohol/week: 0.0 standard drinks   Comment: drinks social . Drug use: Yes   Types: Marijuana  Comment: smoked marijuana 01-02-17 for pain . Sexual activity: Not Currently Lifestyle . Physical activity:   Days per week: Not on file   Minutes per session: Not on file . Stress: Not on file Relationships . Social connections:   Talks on phone: Not on file   Gets together: Not on file   Attends religious service: Not on file   Active member of club or  organization: Not on file   Attends meetings of clubs or organizations: Not on file   Relationship status: Not on file . Intimate partner violence:   Fear of current or ex partner: Not on file   Emotionally abused: Not on file   Physically abused: Not on file   Forced sexual activity: Not on file Other Topics Concern . Not on file Social History Narrative  Patient lives at home with [place].   Patient has 3 children 2 step.   Patient has 13 years of schooling.   Patient is right handed.     Allergies Allergen Reactions . Lisinopril Anaphylaxis   Swelling of lips and tongue.    Outpatient Medications Prior to Visit Medication Sig Dispense Refill . albuterol (PROAIR HFA) 108 (90 Base) MCG/ACT inhaler Inhale 2 puffs into the lungs every 4 (four) hours as needed for wheezing or shortness of breath. 1 Inhaler 4 . apixaban (ELIQUIS) 5 MG TABS tablet Take 1 tablet (5 mg total) by mouth 2 (two) times daily. 60 tablet 5 . Azelastine HCl 0.15 % SOLN Place 1 spray into both nostrils 2 (two) times daily. 30 mL 5 . carvedilol (COREG) 12.5 MG tablet Take 1 tablet (12.5 mg total) by mouth 2 (two) times daily. 60 tablet 3 . cetirizine (ZYRTEC) 10 MG tablet TAKE 1 TABLET BY MOUTH EVERY DAY 30 tablet 2 . CYMBALTA 60 MG capsule Take 60 mg by mouth daily.  1 . diltiazem (CARDIZEM CD) 300 MG 24 hr capsule Take 1 capsule by mouth daily.  2 . dronedarone (MULTAQ) 400 MG tablet Take 1 tablet (400 mg total) by mouth 2 (two) times daily with a meal. 60 tablet 6 . fluticasone (FLONASE) 50 MCG/ACT nasal spray Two sprays in each nostril twice a day for 5 days then reduce to once a day two sprays each nostril 16 g 1 . hydrALAZINE (APRESOLINE) 100 MG tablet Take 1 tablet (100 mg total) by mouth 3 (three) times daily. 270 tablet 3 . ketotifen (ZADITOR) 0.025 % ophthalmic solution Place 1 drop into both eyes 2 (two) times daily. 10 mL 1 . Olopatadine HCl (PAZEO) 0.7 % SOLN Place 1 drop into  both eyes daily. 1 Bottle 5 . omeprazole (PRILOSEC) 20 MG capsule Take 1 capsule (20 mg total) by mouth daily. 30 capsule 3 . OXcarbazepine (TRILEPTAL) 150 MG tablet Take 1 tablet (150 mg total) by mouth 2 (two) times daily. 60 tablet 2 . Oxycodone HCl 10 MG TABS Take 1 tablet (10 mg total) by mouth 3 (three) times daily.  0 . prazosin (MINIPRESS) 1 MG capsule TAKE 1 CAPSULE BY MOUTH EVERYDAY AT BEDTIME  2 . pregabalin (LYRICA) 100 MG capsule Take 1 capsule (100 mg total) by mouth 2 (two) times daily.   Marland Kitchen tiZANidine (ZANAFLEX) 4 MG tablet Take 1 tablet (4 mg total) by mouth every 8 (eight) hours as needed for muscle spasms. 90 tablet 2 . triamcinolone ointment (KENALOG) 0.5 % Apply 1 application topically 2 (two) times daily. 30 g 0  No facility-administered medications prior to visit.    Review of  Systems  Constitutional: Negative for fever.  HENT: Positive for postnasal drip, rhinorrhea, sinus pressure, sinus pain, sneezing, sore throat and tinnitus. Negative for ear discharge and ear pain.   Eyes: Positive for redness and itching.  Respiratory: Positive for shortness of breath and wheezing. Negative for cough, hemoptysis, sputum production and chest tightness.   Cardiovascular: Negative for chest pain, palpitations, orthopnea, leg swelling and PND.  Gastrointestinal: Negative.   Skin: Negative.  Negative for rash.  Allergic/Immunologic: Positive for environmental allergies.  Neurological: Positive for headaches.  Psychiatric/Behavioral: Negative for dysphoric mood. The patient is nervous/anxious.    Observations/Objective: No observations were made as this is a telephone visit  Assessment and Plan: #1 severe allergic rhinitis with associated recurrent sinusitis and underlying asthma             Plan will be to prescribe pulse prednisone for 5 days 40 mg daily then discontinue  and also a 7-day course of doxycycline for sinusitis We will also renew Astelin and Flonase nasal  sprays along with antihistamine eyedrops.  The patient had no other concerns      Review of Systems     Objective:   Physical Exam        Assessment & Plan:

## 2018-12-26 ENCOUNTER — Ambulatory Visit: Payer: Medicaid Other | Admitting: Rehabilitative and Restorative Service Providers"

## 2019-01-01 ENCOUNTER — Encounter: Payer: Self-pay | Admitting: Critical Care Medicine

## 2019-01-01 ENCOUNTER — Other Ambulatory Visit: Payer: Self-pay

## 2019-01-01 ENCOUNTER — Ambulatory Visit: Payer: Medicaid Other | Attending: Critical Care Medicine | Admitting: Critical Care Medicine

## 2019-01-01 DIAGNOSIS — K219 Gastro-esophageal reflux disease without esophagitis: Secondary | ICD-10-CM

## 2019-01-01 DIAGNOSIS — J321 Chronic frontal sinusitis: Secondary | ICD-10-CM | POA: Diagnosis not present

## 2019-01-01 DIAGNOSIS — J302 Other seasonal allergic rhinitis: Secondary | ICD-10-CM | POA: Diagnosis not present

## 2019-01-01 DIAGNOSIS — J3089 Other allergic rhinitis: Secondary | ICD-10-CM

## 2019-01-01 DIAGNOSIS — J4520 Mild intermittent asthma, uncomplicated: Secondary | ICD-10-CM

## 2019-01-01 MED ORDER — ALBUTEROL SULFATE HFA 108 (90 BASE) MCG/ACT IN AERS: 2.0000 | INHALATION_SPRAY | RESPIRATORY_TRACT | 4 refills | Status: DC | PRN

## 2019-01-01 MED ORDER — CETIRIZINE HCL 10 MG PO TABS: 10.0000 mg | ORAL_TABLET | Freq: Every day | ORAL | 6 refills | Status: DC

## 2019-01-01 MED ORDER — PAZEO 0.7 % OP SOLN: 1.0000 [drp] | Freq: Two times a day (BID) | OPHTHALMIC | 5 refills | Status: DC

## 2019-01-01 MED ORDER — FLUTICASONE PROPIONATE 50 MCG/ACT NA SUSP: NASAL | 6 refills | Status: DC

## 2019-01-01 MED ORDER — OMEPRAZOLE 20 MG PO CPDR: 20.0000 mg | DELAYED_RELEASE_CAPSULE | Freq: Every day | ORAL | 6 refills | Status: DC

## 2019-01-01 NOTE — Progress Notes (Signed)
Patient ID: Jason Stokes, male   DOB: [birth year], 58 y.o.   MRN: [id number] Virtual Visit via Telephone Note  I connected with [name] on 01/01/19 at  9:10 AM EDT by telephone and verified that I am speaking with the correct person using two identifiers.   Consent:  I discussed the limitations, risks, security and privacy concerns of performing an evaluation and management service by telephone and the availability of in person appointments. I also discussed with the patient that there may be a patient responsible charge related to this service. The patient expressed understanding and agreed to proceed.  Location of patient: I was in office  Location of provider: The patient was at home  Persons participating in the televisit with the patient.   No one else is on the call but the patient    History of Present Illness: 58 y.o.M with history of severe persistent asthma and chronic allergic rhinitis and recurrent sinusitis.  This is a telephone visit and I confirmed the patient was on the phone by himself  Since the last visit the patient notes increased drainage from the eyes with dryness in the eyes and mucus in the eyes.  He is on the once a day Patanol eyedrops.  He has been monitoring his blood pressure and is been stable.  He is now on a chronic pain clinic and is on scheduled oxycodone for a pain specialist.  The patient denies any reflux symptoms.  The patient states his dyspnea is at baseline.  He uses albuterol as needed.  He is not been in the emergency room or the hospital recently.  He does take allergy immunotherapy every 2 weeks.  The patient states his home environment is improved.  He has minimal sinus pressure and nasal drainage.  He is short of breath with heavy exertion only.  He is not waking up from sleep short of breath.  There is no excess cough.  There is no soreness in the throat.      He states the mold situation in his rental house has been  mitigated.  Getting allergy immunotherapy by [facility] [place]  Pos IN BOLD Constitutional:   No  weight loss, night sweats,  Fevers, chills, fatigue, lassitude. HEENT:   No headaches,  Difficulty swallowing,  Tooth/dental problems,  Sore throat,                No sneezing, itching, ear ache, nasal congestion, post nasal drip,   CV:  No chest pain,  Orthopnea, PND, swelling in lower extremities, anasarca, dizziness, palpitations  GI   heartburn, indigestion, abdominal pain, nausea, vomiting, diarrhea, change in bowel habits, loss of appetite  Resp:  shortness of breath with exertion not at rest.  No excess mucus, no productive cough,  No non-productive cough,  No coughing up of blood.  No change in color of mucus.   wheezing.  No chest wall deformity  Skin: no rash or lesions.  GU: no dysuria, change in color of urine, no urgency or frequency.  No flank pain.  MS:  No joint pain or swelling.  No decreased range of motion.  No back pain.  Psych:  No change in mood or affect. No depression or anxiety.  No memory loss.   Observations/Objective: No observations as this was a telephone visit  Assessment and Plan: #1 moderate persistent asthma stable at this time without exacerbation with significant atopic features  Plan will be to continue albuterol on  an as-needed basis and continue Flonase nasal spray along with antihistamine therapy  #2 severe allergic rhinitis with allergic conjunctivitis  Plan will be to increase the antihistamine eyedrops to twice daily and have advised the patient to follow-up with his eye provider  #3 gastroesophageal reflux disease  Plan will be to continue proton pump inhibitor therapy daily Follow Up Instructions: Patient understands will be an in office exam scheduled for end of July I sent refills on his medications to the pharmacy and increased his antihistamine eyedrops to twice daily   I discussed the assessment and treatment plan with the  patient. The patient was provided an opportunity to ask questions and all were answered. The patient agreed with the plan and demonstrated an understanding of the instructions.   The patient was advised to call back or seek an in-person evaluation if the symptoms worsen or if the condition fails to improve as anticipated.  I provided30 minutes of non-face-to-face time during this encounter  including  median intraservice time , review of notes, labs, imaging, medications  and explaining diagnosis and management to the patient .    Asencion Noble, MD

## 2019-01-01 NOTE — Progress Notes (Signed)
Patient verified DOB Patient has taken medication today. Patient has eaten today. Patient denies pain at this time. Patient complains of eye drainage for the last 3 weeks with the mucous becoming thicker.

## 2019-01-02 ENCOUNTER — Ambulatory Visit: Payer: Medicaid Other | Admitting: Rehabilitative and Restorative Service Providers"

## 2019-01-04 ENCOUNTER — Ambulatory Visit (INDEPENDENT_AMBULATORY_CARE_PROVIDER_SITE_OTHER): Payer: Medicaid Other | Admitting: *Deleted

## 2019-01-04 DIAGNOSIS — J309 Allergic rhinitis, unspecified: Secondary | ICD-10-CM | POA: Diagnosis not present

## 2019-01-09 ENCOUNTER — Telehealth: Payer: Self-pay

## 2019-01-09 ENCOUNTER — Encounter: Payer: Self-pay | Admitting: Rehabilitative and Restorative Service Providers"

## 2019-01-09 ENCOUNTER — Ambulatory Visit: Payer: Medicaid Other | Admitting: Rehabilitative and Restorative Service Providers"

## 2019-01-09 NOTE — Telephone Encounter (Signed)
I reached out to the pt and left a vm asking him to call me back to discuss rescheduling his 01/11/19 appt. Temp office hours are Monday-Thursday due to covid 19. Pt has been removed from 01/11/19 schedule.

## 2019-01-09 NOTE — Therapy (Signed)
[facility] [address] [place], [place], [address] Phone: [phone]   Fax:  [phone]  Patient Details  Name: Jason Stokes MRN: [id number] Date of Birth: 02/14/1961 Referring Provider:  No ref. provider found  PHYSICAL THERAPY DISCHARGE SUMMARY  Visits from Start of Care: 9  Current functional level related to goals / functional outcomes:   *Long term goals not reassessed due to the patient not returning PT Long Term Goals - 12/03/18 2115      PT LONG TERM GOAL #1   Title  Pt will improve neck ROM by 10-12 degrees in all planes of movement    Baseline  R rotation 45 degrees, extension 25 degrees 10/23/2018    Time  6    Period  Weeks    Status  Revised    Target Date  01/17/19      PT LONG TERM GOAL #2   Title  Pt will be able to perform daily ADL's with pain remaining at </= 4/10    Baseline  can increase to 9/10 with activity, decreases to 6/10 with treatment    Time  6    Status  Revised    Target Date  01/17/19      PT LONG TERM GOAL #3   Title  Pt will relay improved sleep quality due to less overall neck pain    Baseline  not sleeping well due to neck pain and discomfort; 10/23/2018-6 hours of sleep, but has difficulty finding good position    Time  6    Period  Weeks    Status  Revised    Target Date  01/17/19      PT LONG TERM GOAL #4   Title  Patient will demonstrate independence and compliance with cervical ROM and postural HEP    Baseline  Performs HEP about 50% of the time    Time  6    Period  Weeks    Status  New    Target Date  01/17/19         Remaining deficits: See last treatment note on 12/19/2018 for patient status.  He did not show for remaining visits.   Education / Equipment: Home program.  Plan: Patient agrees to discharge.  Patient goals were not met. Patient is being discharged due to not returning since the last visit.  ?????     Thank you for the referral of this  patient. Rudell Cobb, MPT            [place] 01/09/2019, 1:14 PM  [facility] [address] [place], [place], [address] Phone: [phone]   Fax:  [phone]

## 2019-01-11 ENCOUNTER — Ambulatory Visit: Payer: Medicaid Other | Admitting: Neurology

## 2019-01-17 ENCOUNTER — Other Ambulatory Visit: Payer: Self-pay | Admitting: *Deleted

## 2019-01-17 MED ORDER — DILTIAZEM HCL ER COATED BEADS 300 MG PO CP24: 300.0000 mg | ORAL_CAPSULE | Freq: Every day | ORAL | 1 refills | Status: DC

## 2019-01-25 ENCOUNTER — Ambulatory Visit (INDEPENDENT_AMBULATORY_CARE_PROVIDER_SITE_OTHER): Payer: Medicaid Other | Admitting: *Deleted

## 2019-01-25 ENCOUNTER — Telehealth: Payer: Self-pay

## 2019-01-25 DIAGNOSIS — J309 Allergic rhinitis, unspecified: Secondary | ICD-10-CM

## 2019-01-25 NOTE — Telephone Encounter (Signed)
Patient was called and informed of handicap form being ready for pick up.

## 2019-02-01 ENCOUNTER — Ambulatory Visit (INDEPENDENT_AMBULATORY_CARE_PROVIDER_SITE_OTHER): Payer: Medicaid Other | Admitting: *Deleted

## 2019-02-01 DIAGNOSIS — J309 Allergic rhinitis, unspecified: Secondary | ICD-10-CM | POA: Diagnosis not present

## 2019-02-08 ENCOUNTER — Ambulatory Visit (INDEPENDENT_AMBULATORY_CARE_PROVIDER_SITE_OTHER): Payer: Medicaid Other | Admitting: *Deleted

## 2019-02-08 DIAGNOSIS — J309 Allergic rhinitis, unspecified: Secondary | ICD-10-CM | POA: Diagnosis not present

## 2019-02-15 ENCOUNTER — Ambulatory Visit (INDEPENDENT_AMBULATORY_CARE_PROVIDER_SITE_OTHER): Payer: Medicaid Other

## 2019-02-15 DIAGNOSIS — J309 Allergic rhinitis, unspecified: Secondary | ICD-10-CM | POA: Diagnosis not present

## 2019-02-22 ENCOUNTER — Ambulatory Visit (INDEPENDENT_AMBULATORY_CARE_PROVIDER_SITE_OTHER): Payer: Medicaid Other | Admitting: *Deleted

## 2019-02-22 DIAGNOSIS — J309 Allergic rhinitis, unspecified: Secondary | ICD-10-CM

## 2019-02-24 ENCOUNTER — Ambulatory Visit (HOSPITAL_COMMUNITY)
Admission: EM | Admit: 2019-02-24 | Discharge: 2019-02-24 | Disposition: A | Discharge status: Home / Self Care | Payer: Medicaid Other | Attending: Family Medicine | Admitting: Family Medicine

## 2019-02-24 ENCOUNTER — Other Ambulatory Visit: Payer: Self-pay

## 2019-02-24 ENCOUNTER — Encounter (HOSPITAL_COMMUNITY): Payer: Self-pay

## 2019-02-24 DIAGNOSIS — H1031 Unspecified acute conjunctivitis, right eye: Secondary | ICD-10-CM

## 2019-02-24 DIAGNOSIS — H168 Other keratitis: Secondary | ICD-10-CM

## 2019-02-24 MED ORDER — ERYTHROMYCIN 5 MG/GM OP OINT: TOPICAL_OINTMENT | OPHTHALMIC | 0 refills | Status: DC

## 2019-02-24 NOTE — ED Triage Notes (Signed)
Patient presents to Urgent Care with complaints of right eye pain and redness since last night. Patient reports he has not tried eyedrops, does not think a foreign body in it. Pt has eye taped shut with paper tape upon arrival.

## 2019-02-24 NOTE — Discharge Instructions (Signed)
Start erythromycin ointment as directed. Monitor for any worsening of symptoms, changes in vision, sensitivity to light, eye swelling, painful eye movement, follow up with ophthalmology for further evaluation.

## 2019-02-24 NOTE — ED Provider Notes (Signed)
[place]    CSN: [id number] Arrival date & time: 02/24/19  1659     History   Chief Complaint Chief Complaint  Patient presents with   eye redness    HPI Jason Stokes is a 58 y.o. male.   58 year old male comes in for evaluation of right eye pain after waking up last night.  States since then, has had increased pain to the right eye, watery drainage, photophobia.  Denies eye crusting, vision changes. Denies URI symptoms such as cough, congestion, sore throat. Denies fever, chills, body aches. Patient taped right eye shut due to pain. Denies contact lens use.      Past Medical History:  Diagnosis Date   Anxiety    Bilateral carpal tunnel syndrome 01/10/2018   Bipolar disorder (HCC)    Chronic lower back pain    Depression    Dysrhythmia    a-fib   GERD (gastroesophageal reflux disease)    History of alcohol abuse    History of nuclear stress test    Myoview 10/16: EF 50%, diaphragmatic attenuation, no ischemia, low risk   Hypertension    Migraine 2012-2014   Moderate persistent asthma with acute exacerbation 05/02/2018   PAF (paroxysmal atrial fibrillation) ([place]) 03/27/2015   a. Myoview neg for ischemia >> Flecainide started 10/16 >> FU ETT    Schizophrenia ([place])    Sciatica neuralgia    Small vessel disease ([place])    Right basal ganglia stroke   Stroke ([place]) "between 2012-2014"   residual "AF" (09/22/2015)    Patient Active Problem List   Diagnosis Date Noted   Musculoskeletal chest pain 07/24/2018   Asthma, mild intermittent 05/02/2018   Allergic rhinitis caused by mold 05/02/2018   Tobacco use 05/02/2018   Bilateral carpal tunnel syndrome 01/10/2018   Stroke ([place])    Schizophrenia ([place])    Migraine    History of nuclear stress test    History of alcohol abuse    GERD (gastroesophageal reflux disease)    Dysrhythmia    Chronic lower back pain    Anxiety    Arthritis of knee, right 04/12/2017   Bipolar  disorder ([place]) 04/03/2017   Lipoma of forehead 09/22/2016   Trigger ring finger of right hand 06/22/2016   Paroxysmal atrial fibrillation (HCC)    AF (atrial fibrillation) ([place]) 12/10/2015   Eczema 07/10/2015   History of CVA (cerebrovascular accident) 05/04/2015   Tear of medial meniscus of right knee 03/18/2015   Chronic pain of right knee 03/12/2015   Other and unspecified hyperlipidemia 11/25/2013   Nasal congestion 11/25/2013   Sinusitis, chronic 05/09/2013   Chronic low back pain 10/12/2012   Lumbar radiculopathy 10/12/2012   Hypertension 10/12/2012   Depression 10/12/2012   Insomnia 10/12/2012    Past Surgical History:  Procedure Laterality Date   ATRIAL FIBRILLATION ABLATION  09/22/2015   CYST EXCISION  1996-97   surgery back of head    ELECTROPHYSIOLOGIC STUDY N/A 09/22/2015   Procedure: Atrial Fibrillation Ablation;  Surgeon: Will [name], MD;  Location: [facility];  Service: Cardiovascular;  Laterality: N/A;   ELECTROPHYSIOLOGIC STUDY N/A 12/10/2015   Procedure: Atrial Fibrillation Ablation;  Surgeon: Will [name], MD;  Location: [facility];  Service: Cardiovascular;  Laterality: N/A;   ELECTROPHYSIOLOGIC STUDY N/A 12/11/2015   Procedure: Cardioversion;  Surgeon: Will [name], MD;  Location: [facility];  Service: Cardiovascular;  Laterality: N/A;   EXCISION MASS HEAD N/A 01/06/2017  Procedure: EXCISION MASS FOREHEAD;  Surgeon: Irene Limbo, MD;  Location: [facility];  Service: Plastics;  Laterality: N/A;   GANGLION CYST EXCISION Left    INTERCOSTAL NERVE BLOCK  2005   KNEE ARTHROSCOPY Right 2016       Home Medications    Prior to Admission medications   Medication Sig Start Date End Date Taking? Authorizing Provider  albuterol (PROAIR HFA) 108 (90 Base) MCG/ACT inhaler Inhale 2 puffs into the lungs every 4 (four) hours as needed for wheezing or shortness of breath. 01/01/19    Elsie Stain, MD  apixaban (ELIQUIS) 5 MG TABS tablet Take 1 tablet (5 mg total) by mouth 2 (two) times daily. 08/14/18   Charlott Rakes, MD  Azelastine HCl 0.15 % SOLN Place 1 spray into both nostrils 2 (two) times daily. 10/23/18   Elsie Stain, MD  carvedilol (COREG) 12.5 MG tablet Take 1 tablet (12.5 mg total) by mouth 2 (two) times daily. 08/14/18   Charlott Rakes, MD  cetirizine (ZYRTEC) 10 MG tablet Take 1 tablet (10 mg total) by mouth daily. 01/01/19   Elsie Stain, MD  CYMBALTA 60 MG capsule Take 60 mg by mouth daily. 04/29/14   [provider]  diltiazem (CARDIZEM CD) 300 MG 24 hr capsule Take 1 capsule (300 mg total) by mouth daily. 01/17/19   Camnitz, Ocie Doyne, MD  dronedarone (MULTAQ) 400 MG tablet Take 1 tablet (400 mg total) by mouth 2 (two) times daily with a meal. 02/21/18   Camnitz, Ocie Doyne, MD  erythromycin ophthalmic ointment Place a 1/2 inch ribbon of ointment into the lower eyelid 3 times a day until symptoms resolve. 02/24/19   Tasia Catchings, Nezzie Manera V, PA-C  fluticasone (FLONASE) 50 MCG/ACT nasal spray Two sprays in each nostril twice a day for 5 days then reduce to once a day two sprays each nostril 01/01/19   Elsie Stain, MD  hydrALAZINE (APRESOLINE) 100 MG tablet Take 1 tablet (100 mg total) by mouth 3 (three) times daily. 04/16/18   Camnitz, Ocie Doyne, MD  Olopatadine HCl (PAZEO) 0.7 % SOLN Place 1 drop into both eyes 2 (two) times a day. 01/01/19   Elsie Stain, MD  omeprazole (PRILOSEC) 20 MG capsule Take 1 capsule (20 mg total) by mouth daily. 01/01/19   Elsie Stain, MD  OXcarbazepine (TRILEPTAL) 150 MG tablet Take 1 tablet (150 mg total) by mouth 2 (two) times daily. 06/20/18   Kathrynn Ducking, MD  Oxycodone HCl 10 MG TABS TAKE 1 (ONE) TABLET FOUR TIMES DAILY, AS NEEDED 12/07/18   [provider]  prazosin (MINIPRESS) 1 MG capsule TAKE 1 CAPSULE BY MOUTH EVERYDAY AT BEDTIME 09/04/17   [provider]  pregabalin (LYRICA) 75 MG  capsule Take 1 capsule by mouth 4 (four) times daily as needed. 09/17/18   [provider]  tiZANidine (ZANAFLEX) 4 MG tablet Take 1 tablet (4 mg total) by mouth every 8 (eight) hours as needed for muscle spasms. 01/31/18   Charlott Rakes, MD    Family History Family History  Problem Relation Age of Onset   Heart disease Father    Schizophrenia Sister     Social History Social History   Tobacco Use   Smoking status: Former Smoker    Years: 29.00   Smokeless tobacco: Never Used  Substance Use Topics   Alcohol use: Yes    Alcohol/week: 0.0 standard drinks    Comment: drinks social   Drug use: Yes  Types: Marijuana    Comment: smoked marijuana 01-02-17 for pain     Allergies   Lisinopril   Review of Systems Review of Systems  Reason unable to perform ROS: See HPI as above.     Physical Exam Triage Vital Signs ED Triage Vitals  Enc Vitals Group     BP 02/24/19 1722 130/87     Pulse Rate 02/24/19 1722 71     Resp 02/24/19 1722 17     Temp 02/24/19 1722 98.4 F (36.9 C)     Temp Source 02/24/19 1722 Temporal     SpO2 02/24/19 1722 98 %     Weight --      Height --      Head Circumference --      Peak Flow --      Pain Score 02/24/19 1721 9     Pain Loc --      Pain Edu? --      Excl. in [place]? --    No data found.  Updated Vital Signs BP 130/87 (BP Location: Right Arm)    Pulse 71    Temp 98.4 F (36.9 C) (Temporal)    Resp 17    SpO2 98%   Visual Acuity (unable to read [facility] chart due to photophobia) Right Eye Distance:   6+ ft count fingers Left Eye Distance:   6+ ft count fingers Bilateral Distance:    Right Eye Near:   Left Eye Near:    Bilateral Near:     Physical Exam Eyes:     General: Lids are normal. Lids are everted, no foreign bodies appreciated.     Extraocular Movements: Extraocular movements intact.     Conjunctiva/sclera:     Right eye: Right conjunctiva is injected. Chemosis present.     Left eye: Left conjunctiva is not  injected. No chemosis.    Pupils: Pupils are equal, round, and reactive to light.     Comments: Photophobic on exam. Unable to tolerate tonopen with movements of pupils.   Fluorescein stain without uptake.  Pulmonary:     Effort: Pulmonary effort is normal. No respiratory distress.  Skin:    General: Skin is warm and dry.    UC Treatments / Results  Labs (all labs ordered are listed, but only abnormal results are displayed) Labs Reviewed - No data to display  EKG   Radiology No results found.  Procedures Procedures (including critical care time)  Medications Ordered in UC Medications - No data to display  Initial Impression / Assessment and Plan / UC Course  I have reviewed the triage vital signs and the nursing notes.  Pertinent labs & imaging results that were available during my care of the patient were reviewed by me and considered in my medical decision making (see chart for details).    Discussed case with Dr. Joseph Art, who also examined patient.  Discussed possible exposure keratitis causing symptoms.  Will use erythromycin ointment at this time, other symptomatic treatment discussed.  Lower suspicion for acute angle glaucoma at this time given no changes to vision per patient, and reactive pupil.  Return precautions given.  Patient expresses understanding and agrees to plan.  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 Provider   erythromycin ophthalmic ointment Place a 1/2 inch ribbon of ointment into the lower eyelid  3 times a day until symptoms resolve. 1 g Tobin Chad, [place] 02/24/19 1843

## 2019-03-01 ENCOUNTER — Ambulatory Visit (INDEPENDENT_AMBULATORY_CARE_PROVIDER_SITE_OTHER): Payer: Medicaid Other | Admitting: *Deleted

## 2019-03-01 DIAGNOSIS — J309 Allergic rhinitis, unspecified: Secondary | ICD-10-CM

## 2019-03-06 ENCOUNTER — Telehealth: Payer: Self-pay | Admitting: Neurology

## 2019-03-06 DIAGNOSIS — M542 Cervicalgia: Secondary | ICD-10-CM | POA: Insufficient documentation

## 2019-03-06 NOTE — Telephone Encounter (Signed)
The patient is aware to expect a call from PT for scheduling.

## 2019-03-06 NOTE — Addendum Note (Signed)
Addended by: Marcial Pacas on: 03/06/2019 12:24 PM   Modules accepted: Orders

## 2019-03-06 NOTE — Telephone Encounter (Signed)
I spoke to the patient.  States his neck pain/stiffness is still bothersome and he would like to continue PT.

## 2019-03-06 NOTE — Telephone Encounter (Signed)
Pt states that he is needing his PT referral renewed in order for him to continue. Please advise.

## 2019-03-06 NOTE — Telephone Encounter (Signed)
PT order was placed.

## 2019-03-15 ENCOUNTER — Ambulatory Visit (INDEPENDENT_AMBULATORY_CARE_PROVIDER_SITE_OTHER): Payer: Medicaid Other

## 2019-03-15 DIAGNOSIS — J309 Allergic rhinitis, unspecified: Secondary | ICD-10-CM | POA: Diagnosis not present

## 2019-03-19 ENCOUNTER — Ambulatory Visit: Payer: Medicaid Other | Attending: Neurology | Admitting: Physical Therapy

## 2019-03-19 ENCOUNTER — Other Ambulatory Visit: Payer: Self-pay

## 2019-03-19 ENCOUNTER — Encounter: Payer: Self-pay | Admitting: Physical Therapy

## 2019-03-19 DIAGNOSIS — M542 Cervicalgia: Secondary | ICD-10-CM | POA: Diagnosis not present

## 2019-03-19 DIAGNOSIS — R29898 Other symptoms and signs involving the musculoskeletal system: Secondary | ICD-10-CM | POA: Diagnosis present

## 2019-03-19 DIAGNOSIS — R293 Abnormal posture: Secondary | ICD-10-CM | POA: Insufficient documentation

## 2019-03-20 NOTE — Therapy (Signed)
[facility] [address] [place], [place], [address] Phone: [phone]   Fax:  [phone]  Physical Therapy Evaluation  Patient Details  Name: Jason Stokes MRN: [id number] Date of Birth: [birth year] Referring Provider (PT): Marcial Pacas   Encounter Date: 03/19/2019 CLINIC OPERATION CHANGES: [facility] is open at lower capacity following universal masking, social distancing, and patient screening.  The patient's COVID risk of complications score is 4.  PT End of Session - 03/20/19 0818    Visit Number  1   New POC   Number of Visits  12    Date for PT Re-Evaluation  06/18/19    Authorization Type  Medicaid-requesting auth    PT Start Time  0850    PT Stop Time  0932    PT Time Calculation (min)  42 min    Activity Tolerance  Patient tolerated treatment well   Pt rates pain as >10/10 throughout session   Behavior During Therapy  [facility] for tasks assessed/performed       Past Medical History:  Diagnosis Date  . Anxiety   . Bilateral carpal tunnel syndrome 01/10/2018  . Bipolar disorder ([place])   . Chronic lower back pain   . Depression   . Dysrhythmia    a-fib  . GERD (gastroesophageal reflux disease)   . History of alcohol abuse   . History of nuclear stress test    Myoview 10/16: EF 50%, diaphragmatic attenuation, no ischemia, low risk  . Hypertension   . Migraine 2012-2014  . Moderate persistent asthma with acute exacerbation 05/02/2018  . PAF (paroxysmal atrial fibrillation) ([place]) 03/27/2015   a. Myoview neg for ischemia >> Flecainide started 10/16 >> FU ETT   . Schizophrenia ([place])   . Sciatica neuralgia   . Small vessel disease ([name])    Right basal ganglia stroke  . Stroke [facility]) "between 2012-2014"   residual "AF" (09/22/2015)    Past Surgical History:  Procedure Laterality Date  . ATRIAL FIBRILLATION ABLATION  09/22/2015  . CYST EXCISION  1996-97   surgery back of head   . ELECTROPHYSIOLOGIC  STUDY N/A 09/22/2015   Procedure: Atrial Fibrillation Ablation;  Surgeon: Will [name], MD;  Location: [facility];  Service: Cardiovascular;  Laterality: N/A;  . ELECTROPHYSIOLOGIC STUDY N/A 12/10/2015   Procedure: Atrial Fibrillation Ablation;  Surgeon: Will [name], MD;  Location: [facility];  Service: Cardiovascular;  Laterality: N/A;  . ELECTROPHYSIOLOGIC STUDY N/A 12/11/2015   Procedure: Cardioversion;  Surgeon: Will [name], MD;  Location: [facility];  Service: Cardiovascular;  Laterality: N/A;  . EXCISION MASS HEAD N/A 01/06/2017   Procedure: EXCISION MASS FOREHEAD;  Surgeon: Irene Limbo, MD;  Location: [facility];  Service: Plastics;  Laterality: N/A;  . GANGLION CYST EXCISION Left   . INTERCOSTAL NERVE BLOCK  2005  . KNEE ARTHROSCOPY Right 2016    There were no vitals filed for this visit.   Subjective Assessment - 03/19/19 0902    Subjective  I'm back because the nerves are still bothering me in my neck.  I'm not doing that bike again, I think that's what did me in.    Pertinent History  PMH: chronic LBP, bilat CTS, anx, bipolar,dep, a-fib, GERD, alcohol abuse, HTN,migranes, asthma,schizophrenia,sciatica, CVA    Diagnostic tests  Abnormal MRI scan of cervical spine showing prominent spondylitic change at C3-4 with moderate left sided foraminal narrowing. C4-5 and C5-6 also shows similar but  milder changes. Compared with previous MRI from 10/08/2014 these changes appear to have progressed   Per pt report 03/19/2019, he has not seen MD (to see Dr. Jannifer Franklin 05/2019) or had any additional scans done   Patient Stated Goals  Get nerve back under control so I can move    Currently in Pain?  Yes    Pain Score  --   over 10   Pain Location  --   All the way up and down from my neck to my toes   Pain Orientation  Right;Left    Pain Descriptors / Indicators  Aching;Burning   stiffness, irritable   Pain Type  Chronic pain    Pain  Onset  More than a month ago    Pain Frequency  Intermittent    Aggravating Factors   Pain worse when lying down or sitting too long    Pain Relieving Factors  Once I get these nerves tired out, I get better.         [facility] PT Assessment - 03/20/19 0809      Assessment   Medical Diagnosis  Neck pain    Referring Provider (PT)  Marcial Pacas    Onset Date/Surgical Date  03/06/19   MD order   Next MD Visit  to see Dr. Jannifer Franklin in November, has not seen since 08/14/2018    Prior Therapy  PT d/c'd in June 2020      Precautions   Precautions  None      Balance Screen   Has the patient fallen in the past 6 months  No    Has the patient had a decrease in activity level because of a fear of falling?   No      Home Environment   Living Environment  Private residence    Available Help at Discharge  Personal care attendant   Aide, 2 hrs, 7 days per week-cooking, cleaning, making bed     Prior Function   Level of Independence  Independent with basic ADLs    Vocation  On disability    Leisure  Enjoys fishing, watching grandchildren play      Observation/Other Assessments   Other Surveys   Neck Disability Index    Neck Disability Index   76% disability (worst ratings); 50% disability (best ratings)   Pt checks multiple boxes each item;PT used lowest score each     Sensation   Light Touch  Impaired by gross assessment   Feels different on R side     Posture/Postural Control   Posture/Postural Control  Postural limitations    Postural Limitations  Rounded Shoulders;Forward head      ROM / Strength   AROM / PROM / Strength  Strength;AROM      AROM   Overall AROM   Deficits    Overall AROM Comments  Limitations in shoulder flexion and abduction bilaterally; bilateral shoulder external rotation, internal rotation limitations due to pain.  Pt reports pain increase going all the way down spine with cervical motions and numbness into his arms.    AROM Assessment Site  Cervical    Cervical  Flexion  55   "pulling" sensation   Cervical Extension  10    Cervical - Right Side Bend  10    Cervical - Left Side Bend  11    Cervical - Right Rotation  32    Cervical - Left Rotation  32      Strength   Overall Strength  Deficits   due to decreased shoulder ROM     Palpation   Palpation comment  tender to palpation along upper traps and along paraspinals/border of scapula bilaterally      Transfers   Transfers  Independent with all Transfers                Objective measurements completed on examination: See above findings.                PT Short Term Goals - 03/20/19 0829      PT SHORT TERM GOAL #1   Title  Pt will be independent with HEP for postural stretching and strengthening, for improved management of cervical pain.  TARGET 3 weeks:  04/19/2019    Baseline  Unsure if pt is performing previous HEP; rates pain >10/10 cervical spine    Time  3    Period  Weeks    Status  New    Target Date  04/19/19      PT SHORT TERM GOAL #2   Title  Pt will relay at least 2 new ways to manage his neck pain at home including stretching, heat/ice, TENS, gentle motion, relaxation techniques.    Baseline  >10/10 pain cervical spine; at times radiating down into hands    Time  3    Period  Weeks    Status  New    Target Date  04/19/19      PT SHORT TERM GOAL #3   Title  Pt will demonstrate improved cervical rotation by 5 degrees for improved flexibility, decreased pain    Baseline  32 degrees R and L rotation    Time  3    Period  Weeks    Status  New    Target Date  04/19/19        PT Long Term Goals - 03/20/19 [id number]      PT LONG TERM GOAL #1   Title  Pt will improve neck ROM by 10-12 degrees in all planes of movement, for improved flexibility, activity participation.  TARGET 6 weeks:  05/17/2019    Baseline  Rand L rotation 32 degrees, extension 10 degrees at eval    Time  6    Period  Weeks    Target Date  05/17/19      PT LONG TERM GOAL #2    Title  Pt will report improvement (at worst pain) on NDI to less than or equal to 70% disability.    Baseline  76% disability (at worst rating) on NDI    Time  6    Period  Weeks    Status  New    Target Date  05/17/19      PT LONG TERM GOAL #3   Title  Pt will verbalize/demonstrate plans for ongoing exercise routine for pain management, improved participation in leisure activities.    Baseline  Pt not performing fitness/exercise routine, leisure activities due to pain    Time  6    Period  Weeks    Status  New    Target Date  05/17/19             Plan - 03/20/19 0820    Clinical Impression Statement  Pt is a 58 year old male with history of chronic neck pain, referred to OPPT for neck pain (pt was discharged 12/2018 from bout of PT addressing neck pain at that time).  Pt presents with abnormal posture, decreased shoulder and neck  ROM/flexibility, muscle stiffness and tightness surrounding shoulders and cervical spine, reports of radiating pain into UEs and hands as well as "all the way to his toes."  His neck disability index indicates he is 50 (at best)-76% (at worst) limited in daily activitiesdue to pain.  Pain limits him from leisure activities such as fishing as well as playing with his grandchildren.  Since d/c in June, he demonstrates increased limitations in neck flexibility/ROM measures.  Pt will benefit from skilled PT to address neck pain and management of chronic pain to decrease limitations in his activity participation.    Personal Factors and Comorbidities  Past/Current Experience;Comorbidity 3+    Comorbidities  PMH: chronic LBP, bilat CTS, anx, bipolar,dep, a-fib, GERD, alcohol abuse, HTN,migranes, asthma,schizophrenia,sciatica, CVA    Examination-Activity Limitations  Hygiene/Grooming;Reach Overhead    Examination-Participation Restrictions  Driving;Meal Prep;Cleaning;Community Activity   Fishing, playing with grandkids   Stability/Clinical Decision Making   Evolving/Moderate complexity    Clinical Decision Making  Moderate    Rehab Potential  Fair    PT Frequency  1x / week    PT Duration  6 weeks   plus eval   PT Treatment/Interventions  Cryotherapy;Electrical Stimulation;Iontophoresis 4mg /ml Dexamethasone;Moist Heat;Traction;Ultrasound;Therapeutic activities;Therapeutic exercise;Neuromuscular re-education;Manual techniques;Passive range of motion;Dry needling;Taping;Spinal Manipulations;Joint Manipulations;ADLs/Self Care Home Management    PT Next Visit Plan  Is patient doing any of the exercises from HEP?  If not, provide with HEP for postural stretching, strengthening; modalities/manual therapy for pain; ?TENS for cervical spine (please address with pt how he can obtain electrodes for his TENS unit for his LBP-he asked at eval and I forgot to address)    Consulted and Agree with Plan of Care  Patient       Patient will benefit from skilled therapeutic intervention in order to improve the following deficits and impairments:  Decreased activity tolerance, Impaired flexibility, Decreased range of motion, Decreased strength, Hypomobility, Increased muscle spasms, Postural dysfunction  Visit Diagnosis: Cervicalgia  Abnormal posture  Other symptoms and signs involving the musculoskeletal system     Problem List Patient Active Problem List   Diagnosis Date Noted  . Neck pain on left side 03/06/2019  . Musculoskeletal chest pain 07/24/2018  . Asthma, mild intermittent 05/02/2018  . Allergic rhinitis caused by mold 05/02/2018  . Tobacco use 05/02/2018  . Bilateral carpal tunnel syndrome 01/10/2018  . Stroke ([place])   . Schizophrenia ([place])   . Migraine   . History of nuclear stress test   . History of alcohol abuse   . GERD (gastroesophageal reflux disease)   . Dysrhythmia   . Chronic lower back pain   . Anxiety   . Arthritis of knee, right 04/12/2017  . Bipolar disorder ([place]) 04/03/2017  . Lipoma of forehead 09/22/2016  . Trigger  ring finger of right hand 06/22/2016  . Paroxysmal atrial fibrillation (HCC)   . AF (atrial fibrillation) ([place]) 12/10/2015  . Eczema 07/10/2015  . History of CVA (cerebrovascular accident) 05/04/2015  . Tear of medial meniscus of right knee 03/18/2015  . Chronic pain of right knee 03/12/2015  . Other and unspecified hyperlipidemia 11/25/2013  . Nasal congestion 11/25/2013  . Sinusitis, chronic 05/09/2013  . Chronic low back pain 10/12/2012  . Lumbar radiculopathy 10/12/2012  . Hypertension 10/12/2012  . Depression 10/12/2012  . Insomnia 10/12/2012    Loretta Kluender W. 03/20/2019, 8:37 AM  Frazier Butt., PT  [facility] [address] [place], [place], [address] Phone: [phone]   Fax:  [place]  Name: Jason Stokes MRN: [id number] Date of Birth: 12/08/1960

## 2019-03-27 ENCOUNTER — Encounter (HOSPITAL_COMMUNITY): Payer: Self-pay

## 2019-03-27 ENCOUNTER — Other Ambulatory Visit: Payer: Self-pay

## 2019-03-27 ENCOUNTER — Ambulatory Visit (HOSPITAL_COMMUNITY)
Admission: EM | Admit: 2019-03-27 | Discharge: 2019-03-27 | Disposition: A | Discharge status: Home / Self Care | Payer: Medicaid Other

## 2019-03-27 DIAGNOSIS — H5711 Ocular pain, right eye: Secondary | ICD-10-CM | POA: Diagnosis not present

## 2019-03-27 MED ORDER — TETRACAINE HCL 0.5 % OP SOLN
OPHTHALMIC | Status: AC
  Filled 2019-03-27: qty 4

## 2019-03-27 NOTE — ED Provider Notes (Signed)
[facility]    CSN: [id number] Arrival date & time: 03/27/19  [id number]      History   Chief Complaint Chief Complaint  Patient presents with  . Conjunctivitis    HPI Jason Stokes is a 58 y.o. male.   Patient is a 58 year old male with past medical history of anxiety, bipolar, chronic back pain, depression, A. fib, GERD, alcohol abuse, hypertension, migraine, schizophrenia,, CVA, chronic sinusitis and allergic rhinitis.  Presents today with right eye pain, tearing, conjunctival injection and photophobia.  Symptoms of been constant and worsening since yesterday.  He may or may not have gotten some grass in his eyes.  History of allergies and takes nasal spray and daily allergy eyedrops.  Some discomfort in the left eye but worse on the right.  Feels congested.  No fever, chills.  No itching.  No trauma to the eye.  ROS per HPI      Past Medical History:  Diagnosis Date  . Anxiety   . Bilateral carpal tunnel syndrome 01/10/2018  . Bipolar disorder ([place])   . Chronic lower back pain   . Depression   . Dysrhythmia    a-fib  . GERD (gastroesophageal reflux disease)   . History of alcohol abuse   . History of nuclear stress test    Myoview 10/16: EF 50%, diaphragmatic attenuation, no ischemia, low risk  . Hypertension   . Migraine 2012-2014  . Moderate persistent asthma with acute exacerbation 05/02/2018  . PAF (paroxysmal atrial fibrillation) ([place]) 03/27/2015   a. Myoview neg for ischemia >> Flecainide started 10/16 >> FU ETT   . Schizophrenia ([place])   . Sciatica neuralgia   . Small vessel disease ([place])    Right basal ganglia stroke  . Stroke [facility]) "between 2012-2014"   residual "AF" (09/22/2015)    Patient Active Problem List   Diagnosis Date Noted  . Neck pain on left side 03/06/2019  . Musculoskeletal chest pain 07/24/2018  . Asthma, mild intermittent 05/02/2018  . Allergic rhinitis caused by mold 05/02/2018  . Tobacco use 05/02/2018  . Bilateral carpal  tunnel syndrome 01/10/2018  . Stroke ([place])   . Schizophrenia ([place])   . Migraine   . History of nuclear stress test   . History of alcohol abuse   . GERD (gastroesophageal reflux disease)   . Dysrhythmia   . Chronic lower back pain   . Anxiety   . Arthritis of knee, right 04/12/2017  . Bipolar disorder ([place]) 04/03/2017  . Lipoma of forehead 09/22/2016  . Trigger ring finger of right hand 06/22/2016  . Paroxysmal atrial fibrillation (HCC)   . AF (atrial fibrillation) ([place]) 12/10/2015  . Eczema 07/10/2015  . History of CVA (cerebrovascular accident) 05/04/2015  . Tear of medial meniscus of right knee 03/18/2015  . Chronic pain of right knee 03/12/2015  . Other and unspecified hyperlipidemia 11/25/2013  . Nasal congestion 11/25/2013  . Sinusitis, chronic 05/09/2013  . Chronic low back pain 10/12/2012  . Lumbar radiculopathy 10/12/2012  . Hypertension 10/12/2012  . Depression 10/12/2012  . Insomnia 10/12/2012    Past Surgical History:  Procedure Laterality Date  . ATRIAL FIBRILLATION ABLATION  09/22/2015  . CYST EXCISION  1996-97   surgery back of head   . ELECTROPHYSIOLOGIC STUDY N/A 09/22/2015   Procedure: Atrial Fibrillation Ablation;  Surgeon: Will [name], MD;  Location: [facility];  Service: Cardiovascular;  Laterality: N/A;  . ELECTROPHYSIOLOGIC STUDY N/A 12/10/2015   Procedure: Atrial Fibrillation Ablation;  Surgeon: Will [name], MD;  Location: [facility];  Service: Cardiovascular;  Laterality: N/A;  . ELECTROPHYSIOLOGIC STUDY N/A 12/11/2015   Procedure: Cardioversion;  Surgeon: Will [name], MD;  Location: [facility];  Service: Cardiovascular;  Laterality: N/A;  . EXCISION MASS HEAD N/A 01/06/2017   Procedure: EXCISION MASS FOREHEAD;  Surgeon: Irene Limbo, MD;  Location: [facility];  Service: Plastics;  Laterality: N/A;  . GANGLION CYST EXCISION Left   . INTERCOSTAL NERVE BLOCK  2005  . KNEE ARTHROSCOPY Right  2016       Home Medications    Prior to Admission medications   Medication Sig Start Date End Date Taking? Authorizing Provider  albuterol (PROAIR HFA) 108 (90 Base) MCG/ACT inhaler Inhale 2 puffs into the lungs every 4 (four) hours as needed for wheezing or shortness of breath. 01/01/19   Elsie Stain, MD  apixaban (ELIQUIS) 5 MG TABS tablet Take 1 tablet (5 mg total) by mouth 2 (two) times daily. 08/14/18   Charlott Rakes, MD  Azelastine HCl 0.15 % SOLN Place 1 spray into both nostrils 2 (two) times daily. 10/23/18   Elsie Stain, MD  carvedilol (COREG) 12.5 MG tablet Take 1 tablet (12.5 mg total) by mouth 2 (two) times daily. 08/14/18   Charlott Rakes, MD  cetirizine (ZYRTEC) 10 MG tablet Take 1 tablet (10 mg total) by mouth daily. 01/01/19   Elsie Stain, MD  CYMBALTA 60 MG capsule Take 60 mg by mouth daily. 04/29/14   [provider]  diltiazem (CARDIZEM CD) 300 MG 24 hr capsule Take 1 capsule (300 mg total) by mouth daily. 01/17/19   Camnitz, Ocie Doyne, MD  dronedarone (MULTAQ) 400 MG tablet Take 1 tablet (400 mg total) by mouth 2 (two) times daily with a meal. 02/21/18   Camnitz, Ocie Doyne, MD  erythromycin ophthalmic ointment Place a 1/2 inch ribbon of ointment into the lower eyelid 3 times a day until symptoms resolve. 02/24/19   Tasia Catchings, Amy V, PA-C  fluticasone (FLONASE) 50 MCG/ACT nasal spray Two sprays in each nostril twice a day for 5 days then reduce to once a day two sprays each nostril 01/01/19   Elsie Stain, MD  hydrALAZINE (APRESOLINE) 100 MG tablet Take 1 tablet (100 mg total) by mouth 3 (three) times daily. 04/16/18   Camnitz, Ocie Doyne, MD  Olopatadine HCl (PAZEO) 0.7 % SOLN Place 1 drop into both eyes 2 (two) times a day. 01/01/19   Elsie Stain, MD  omeprazole (PRILOSEC) 20 MG capsule Take 1 capsule (20 mg total) by mouth daily. 01/01/19   Elsie Stain, MD  OXcarbazepine (TRILEPTAL) 150 MG tablet Take 1 tablet (150 mg total) by mouth 2 (two)  times daily. 06/20/18   Kathrynn Ducking, MD  Oxycodone HCl 10 MG TABS TAKE 1 (ONE) TABLET FOUR TIMES DAILY, AS NEEDED 12/07/18   [provider]  prazosin (MINIPRESS) 1 MG capsule TAKE 1 CAPSULE BY MOUTH EVERYDAY AT BEDTIME 09/04/17   [provider]  pregabalin (LYRICA) 75 MG capsule Take 1 capsule by mouth 4 (four) times daily as needed. 09/17/18   [provider]  tiZANidine (ZANAFLEX) 4 MG tablet Take 1 tablet (4 mg total) by mouth every 8 (eight) hours as needed for muscle spasms. 01/31/18   Charlott Rakes, MD    Family History Family History  Problem Relation Age of Onset  . Heart disease Father   . Schizophrenia Sister  Social History Social History   Tobacco Use  . Smoking status: Former Smoker    Years: 29.00  . Smokeless tobacco: Never Used  Substance Use Topics  . Alcohol use: Yes    Alcohol/week: 0.0 standard drinks    Comment: drinks social  . Drug use: Yes    Types: Marijuana    Comment: smoked marijuana 01-02-17 for pain     Allergies   Lisinopril   Review of Systems Review of Systems   Physical Exam Triage Vital Signs ED Triage Vitals  Enc Vitals Group     BP 03/27/19 0855 (!) 153/93     Pulse Rate 03/27/19 0855 74     Resp 03/27/19 0855 18     Temp 03/27/19 0855 98.3 F (36.8 C)     Temp Source 03/27/19 0855 Oral     SpO2 03/27/19 0855 100 %     Weight 03/27/19 0853 256 lb (116.1 kg)     Height --      Head Circumference --      Peak Flow --      Pain Score 03/27/19 0853 10     Pain Loc --      Pain Edu? --      Excl. in [place]? --    No data found.  Updated Vital Signs BP (!) 153/93 (BP Location: Right Arm)   Pulse 74   Temp 98.3 F (36.8 C) (Oral)   Resp 18   Wt 256 lb (116.1 kg)   SpO2 100%   BMI 33.78 kg/m   Visual Acuity Right Eye Distance:   Left Eye Distance:   Bilateral Distance:    Right Eye Near:   Left Eye Near:    Bilateral Near:     Physical Exam Vitals signs and nursing note  reviewed.  Constitutional:      General: He is not in acute distress.    Appearance: He is not ill-appearing, toxic-appearing or diaphoretic.  HENT:     Head: Normocephalic and atraumatic.     Nose: Nose normal.  Eyes:     Conjunctiva/sclera:     Right eye: Right conjunctiva is injected. Chemosis present. No exudate.    Left eye: Left conjunctiva is injected. No exudate.    Comments: TTP of the right eye lids and globe.  Moderate chemosis on the right with injection.  Severe tearing.   Mild injection to the left without pain consensual photophobia   Neck:     Musculoskeletal: Normal range of motion.  Pulmonary:     Effort: Pulmonary effort is normal.  Musculoskeletal: Normal range of motion.  Skin:    General: Skin is warm and dry.  Neurological:     Mental Status: He is alert.  Psychiatric:        Mood and Affect: Mood normal.      UC Treatments / Results  Labs (all labs ordered are listed, but only abnormal results are displayed) Labs Reviewed - No data to display  EKG   Radiology No results found.  Procedures Procedures (including critical care time)  Medications Ordered in UC Medications  tetracaine (PONTOCAINE) 0.5 % ophthalmic solution (has no administration in time range)    Initial Impression / Assessment and Plan / UC Course  I have reviewed the triage vital signs and the nursing notes.  Pertinent labs & imaging results that were available during my care of the patient were reviewed by me and considered in my medical decision making (see chart  for details).     Patient with 1 day of right eye pain, moderate to severe chemosis and injection. Photophobia Severe tearing of the right eye Patient unable to fully open the right eye. Unable to obtain intraocular pressure due to patient's cooperation and inability keep the eye open 1 drop of tetracaine was put in the right eye  Most likely patient's condition is related to allergies Although, based  on the condition of patient's eye, and inability to do an eye exam here,  feel he would be better to see ophthalmologist for further evaluation management. Call Dr. Kathlen Mody with [facility] and they will see him right away.  Patient's wife is taking him there.    Final Clinical Impressions(s) / UC Diagnoses   Final diagnoses:  Pain of right eye     Discharge Instructions     Dr. Kathlen Mody is good to see you in his office right now Please go directly there.     ED Prescriptions    None     Controlled Substance Prescriptions [place] Controlled Substance Registry consulted? Not Applicable   Orvan July, NP 03/27/19 1022

## 2019-03-27 NOTE — Discharge Instructions (Signed)
Dr. Kathlen Mody is good to see you in his office right now Please go directly there.

## 2019-03-27 NOTE — ED Triage Notes (Signed)
Pt states his left eye is painful and has drainage. Pt states his eye is pink.

## 2019-03-29 ENCOUNTER — Ambulatory Visit (INDEPENDENT_AMBULATORY_CARE_PROVIDER_SITE_OTHER): Payer: Medicaid Other

## 2019-03-29 DIAGNOSIS — J309 Allergic rhinitis, unspecified: Secondary | ICD-10-CM

## 2019-04-01 ENCOUNTER — Ambulatory Visit: Payer: Medicaid Other | Admitting: Physical Therapy

## 2019-04-08 ENCOUNTER — Ambulatory Visit: Payer: Medicaid Other | Admitting: Physical Therapy

## 2019-04-08 ENCOUNTER — Telehealth: Payer: Self-pay | Admitting: Physical Therapy

## 2019-04-08 NOTE — Telephone Encounter (Signed)
Called and spoke with Jason Stokes regarding no show for PT last week and no show for PT visit today.  He reports having other appointments conflicting.    PT made patient aware of Medicaid approval dates and upcoming 04/15/19 appointment.  Also, offered pt another appointment time this week, 5:45 04/10/19, which pt agreed to.  Mady Haagensen, PT 04/08/19 10:44 AM Phone: [phone] Fax: [phone]

## 2019-04-09 ENCOUNTER — Other Ambulatory Visit: Payer: Self-pay

## 2019-04-09 ENCOUNTER — Encounter: Payer: Self-pay | Admitting: Cardiology

## 2019-04-09 ENCOUNTER — Ambulatory Visit (INDEPENDENT_AMBULATORY_CARE_PROVIDER_SITE_OTHER): Payer: Medicaid Other | Admitting: Cardiology

## 2019-04-09 VITALS — BP 128/80 | HR 66 | Ht 73.0 in | Wt 259.6 lb

## 2019-04-09 DIAGNOSIS — I480 Paroxysmal atrial fibrillation: Secondary | ICD-10-CM

## 2019-04-09 NOTE — Progress Notes (Signed)
Electrophysiology Office Note   Date:  04/09/2019   ID:  Jason Stokes, DOB 1961/03/19, MRN [id number]  PCP:  Charlott Rakes, MD  Cardiologist:  [name] Primary Electrophysiologist: Jayleena Stille [name], MD    No chief complaint on file.    History of Present Illness: Jason Stokes is a 58 y.o. male who presents today for electrophysiology evaluation.   He is a history of paroxysmal atrial fibrillation, hypertension and remote CVA. He presented to the emergency room with atrial fibrillation and RVR. He was started on Eliquis and diltiazem at that time. He did not follow-up with cardiology until 03/27/15 when he complained of palpitations. He had AF ablation performed on 09/22/15. Unfortunately during his procedure, it was noted that he had a clot in his left atrium and the procedure was therefore canceled. Had repeat ablation 12/10/15. He had a stress test performed after initiation of flecainide. He had ventricular ectopy with nonsustained VT.  Today, denies symptoms of palpitations, chest pain, shortness of breath, orthopnea, PND, lower extremity edema, claudication, dizziness, presyncope, syncope, bleeding, or neurologic sequela. The patient is tolerating medications without difficulties.  Overall he is doing well.  He has no chest pain or shortness of breath.  Is able to do all activities.   Past Medical History:  Diagnosis Date  . Anxiety   . Bilateral carpal tunnel syndrome 01/10/2018  . Bipolar disorder ([place])   . Chronic lower back pain   . Depression   . Dysrhythmia    a-fib  . GERD (gastroesophageal reflux disease)   . History of alcohol abuse   . History of nuclear stress test    Myoview 10/16: EF 50%, diaphragmatic attenuation, no ischemia, low risk  . Hypertension   . Migraine 2012-2014  . Moderate persistent asthma with acute exacerbation 05/02/2018  . PAF (paroxysmal atrial fibrillation) ([place]) 03/27/2015   a. Myoview neg for ischemia >> Flecainide started 10/16 >>  FU ETT   . Schizophrenia ([place])   . Sciatica neuralgia   . Small vessel disease ([place])    Right basal ganglia stroke  . Stroke [facility]) "between 2012-2014"   residual "AF" (09/22/2015)   Past Surgical History:  Procedure Laterality Date  . ATRIAL FIBRILLATION ABLATION  09/22/2015  . CYST EXCISION  1996-97   surgery back of head   . ELECTROPHYSIOLOGIC STUDY N/A 09/22/2015   Procedure: Atrial Fibrillation Ablation;  Surgeon: Novice Vrba [name], MD;  Location: [facility] [facility];  Service: Cardiovascular;  Laterality: N/A;  . ELECTROPHYSIOLOGIC STUDY N/A 12/10/2015   Procedure: Atrial Fibrillation Ablation;  Surgeon: Tatianna Ibbotson [name], MD;  Location: [place] [facility];  Service: Cardiovascular;  Laterality: N/A;  . ELECTROPHYSIOLOGIC STUDY N/A 12/11/2015   Procedure: Cardioversion;  Surgeon: Kathrynne Kulinski [name], MD;  Location: [place] [facility];  Service: Cardiovascular;  Laterality: N/A;  . EXCISION MASS HEAD N/A 01/06/2017   Procedure: EXCISION MASS FOREHEAD;  Surgeon: Irene Limbo, MD;  Location: [facility];  Service: Plastics;  Laterality: N/A;  . GANGLION CYST EXCISION Left   . INTERCOSTAL NERVE BLOCK  2005  . KNEE ARTHROSCOPY Right 2016     Current Outpatient Medications  Medication Sig Dispense Refill  . albuterol (PROAIR HFA) 108 (90 Base) MCG/ACT inhaler Inhale 2 puffs into the lungs every 4 (four) hours as needed for wheezing or shortness of breath. 1 Inhaler 4  . apixaban (ELIQUIS) 5 MG TABS tablet Take 1 tablet (5 mg total) by mouth 2 (two) times daily.  60 tablet 5  . Azelastine HCl 0.15 % SOLN Place 1 spray into both nostrils 2 (two) times daily. 30 mL 5  . carvedilol (COREG) 12.5 MG tablet Take 1 tablet (12.5 mg total) by mouth 2 (two) times daily. 60 tablet 3  . cetirizine (ZYRTEC) 10 MG tablet Take 1 tablet (10 mg total) by mouth daily. 30 tablet 6  . CYMBALTA 60 MG capsule Take 60 mg by mouth daily.  1  . diltiazem (CARDIZEM CD) 300 MG 24 hr capsule Take 1  capsule (300 mg total) by mouth daily. 90 capsule 1  . dronedarone (MULTAQ) 400 MG tablet Take 1 tablet (400 mg total) by mouth 2 (two) times daily with a meal. 60 tablet 6  . erythromycin ophthalmic ointment Place a 1/2 inch ribbon of ointment into the lower eyelid 3 times a day until symptoms resolve. 1 g 0  . fluticasone (FLONASE) 50 MCG/ACT nasal spray Two sprays in each nostril twice a day for 5 days then reduce to once a day two sprays each nostril 16 g 6  . hydrALAZINE (APRESOLINE) 100 MG tablet Take 1 tablet (100 mg total) by mouth 3 (three) times daily. 270 tablet 3  . Olopatadine HCl (PAZEO) 0.7 % SOLN Place 1 drop into both eyes 2 (two) times a day. 1 Bottle 5  . omeprazole (PRILOSEC) 20 MG capsule Take 1 capsule (20 mg total) by mouth daily. 30 capsule 6  . OXcarbazepine (TRILEPTAL) 150 MG tablet Take 1 tablet (150 mg total) by mouth 2 (two) times daily. 60 tablet 2  . Oxycodone HCl 10 MG TABS TAKE 1 (ONE) TABLET FOUR TIMES DAILY, AS NEEDED    . prazosin (MINIPRESS) 1 MG capsule TAKE 1 CAPSULE BY MOUTH EVERYDAY AT BEDTIME  2  . pregabalin (LYRICA) 75 MG capsule Take 1 capsule by mouth 4 (four) times daily as needed.    Marland Kitchen tiZANidine (ZANAFLEX) 4 MG tablet Take 1 tablet (4 mg total) by mouth every 8 (eight) hours as needed for muscle spasms. 90 tablet 2   No current facility-administered medications for this visit.     Allergies:   Lisinopril   Social History:  The patient  reports that he has quit smoking. He quit after 29.00 years of use. He has never used smokeless tobacco. He reports current alcohol use. He reports current drug use. Drug: Marijuana.   Family History:  The patient's family history includes Heart disease in his father; Schizophrenia in his sister.   ROS:  Please see the history of present illness.   Otherwise, review of systems is positive for none.   All other systems are reviewed and negative.   PHYSICAL EXAM: VS:  There were no vitals taken for this visit. ,  BMI There is no height or weight on file to calculate BMI. GEN: Well nourished, well developed, in no acute distress  HEENT: normal  Neck: no JVD, carotid bruits, or masses Cardiac: RRR; no murmurs, rubs, or gallops,no edema  Respiratory:  clear to auscultation bilaterally, normal work of breathing GI: soft, nontender, nondistended, + BS MS: no deformity or atrophy  Skin: warm and dry Neuro:  Strength and sensation are intact Psych: euthymic mood, full affect  EKG:  EKG is ordered today. Personal review of the ekg ordered shows this rhythm, rate 66  Recent Labs: 08/14/2018: ALT 22; BUN 13; Creatinine, Ser 1.32; Potassium 3.8; Sodium 142    Lipid Panel     Component Value Date/Time   CHOL 171  05/07/2018 1004   TRIG 204 (H) 05/07/2018 1004   HDL 33 (L) 05/07/2018 1004   CHOLHDL 5.2 (H) 05/07/2018 1004   CHOLHDL 5.4 10/17/2013 1042   VLDL 36 10/17/2013 1042   LDLCALC 97 05/07/2018 1004     Wt Readings from Last 3 Encounters:  03/27/19 256 lb (116.1 kg)  08/14/18 252 lb 3.2 oz (114.4 kg)  08/14/18 257 lb (116.6 kg)     Other studies Reviewed: Additional studies/ records that were reviewed today include:  SPECT 05/15/15, TTE 03/31/15 Review of the above records today demonstrates:  SPECT  The left ventricular ejection fraction is mildly decreased (45-54%).  Nuclear stress EF: 50%.  There was no ST segment deviation noted during stress.  Defect 1: Moderate sized, mild in intensity, fixed defect in the mid and basal inferior and basal inferolateral wall consistent with diaphragmatic attenuation. No ischemia noted.  This is a low risk study.  TTE - Normal LV systolic function; grade 1 diastolic dysfunction; mildLAE.  ETT 01/26/17 - personally reviewed  Blood pressure demonstrated a normal response to exercise.  There was no ST segment deviation noted during stress.  Negative study for exercise induced ischemia.  There were frequent PVCs, ventricular couplets and  ventricular salvos.   ASSESSMENT AND PLAN:  1.  Paroxysmal atrial fibrillation: Currently on Eliquis and Multitak.  He has remained in sinus rhythm.  No changes.  This patients CHA2DS2-VASc Score and unadjusted Ischemic Stroke Rate (% per year) is equal to 3.2 % stroke rate/year from a score of 3  Above score calculated as 1 point each if present [CHF, HTN, DM, Vascular=MI/PAD/Aortic Plaque, Age if 65-74, or Male] Above score calculated as 2 points each if present [Age > 75, or Stroke/TIA/TE]   2. Hypertension: Currently well controlled  3. Syncope: No further episodes  Current medicines are reviewed at length with the patient today.   The patient does not have concerns regarding his medicines.  The following changes were made today: None  Labs/ tests ordered today include:  No orders of the defined types were placed in this encounter.    Disposition:   FU with Parmvir Boomer 6 months.  Signed, Mayleen Borrero [name], MD  04/09/2019 8:37 AM     [facility] [address] [place] [address] [phone] (office) [phone] (fax)

## 2019-04-09 NOTE — Patient Instructions (Signed)
Medication Instructions:  The current medical regimen is effective;  continue present plan and medications.  If you need a refill on your cardiac medications before your next appointment, please call your pharmacy.   Follow-Up: At [facility], you and your health needs are our priority.  As part of our continuing mission to provide you with exceptional heart care, we have created designated Provider Care Teams.  These Care Teams include your primary Cardiologist (physician) and Advanced Practice Providers (APPs -  Physician Assistants and Nurse Practitioners) who all work together to provide you with the care you need, when you need it. You will need a follow up appointment in 6 months.  Please call our office 2 months in advance to schedule this appointment.  You may see Will Martin Camnitz, MD or one of the following Advanced Practice Providers on your designated Care Team:   Amber Seiler, NP . Renee Ursuy, PA-C  Thank you for choosing [facility]!!      

## 2019-04-10 ENCOUNTER — Ambulatory Visit: Payer: Medicaid Other | Admitting: Physical Therapy

## 2019-04-10 ENCOUNTER — Telehealth: Payer: Self-pay | Admitting: Physical Therapy

## 2019-04-10 NOTE — Telephone Encounter (Signed)
Pt no show for PT appointment today. They where contacted and informed of this. He states he thought it was for tomorrow and he wrote it down wrong. He was reminded of his next appointment and if he can not make it to let us know as we will have to discharge him due to our no show policy as he has now had 3 no shows  Elsie Ra, PT, DPT 04/10/19 6:27 PM

## 2019-04-15 ENCOUNTER — Other Ambulatory Visit: Payer: Self-pay

## 2019-04-15 ENCOUNTER — Ambulatory Visit: Payer: Medicaid Other | Admitting: Physical Therapy

## 2019-04-15 DIAGNOSIS — R293 Abnormal posture: Secondary | ICD-10-CM

## 2019-04-15 DIAGNOSIS — R29898 Other symptoms and signs involving the musculoskeletal system: Secondary | ICD-10-CM

## 2019-04-15 DIAGNOSIS — M542 Cervicalgia: Secondary | ICD-10-CM

## 2019-04-15 NOTE — Therapy (Signed)
[facility] [address] [place], [place], [address] Phone: [phone]   Fax:  [phone]  Physical Therapy Treatment  Patient Details  Name: Jason Stokes MRN: [id number] Date of Birth: 12-21-1960 Referring Provider (PT): Marcial Pacas   Encounter Date: 04/15/2019  PT End of Session - 04/15/19 0908    Visit Number  2   New POC   Number of Visits  12    Date for PT Re-Evaluation  06/18/19    Authorization Type  3 MCD visits 9/14 to 10/4    Authorization - Visit Number  1    Authorization - Number of Visits  3    PT Start Time  3664   pt late   PT Stop Time  0932    PT Time Calculation (min)  38 min    Activity Tolerance  Patient tolerated treatment well   Pt rates pain as >10/10 throughout session   Behavior During Therapy  [facility] for tasks assessed/performed       Past Medical History:  Diagnosis Date  . Anxiety   . Bilateral carpal tunnel syndrome 01/10/2018  . Bipolar disorder ([place])   . Chronic lower back pain   . Depression   . Dysrhythmia    a-fib  . GERD (gastroesophageal reflux disease)   . History of alcohol abuse   . History of nuclear stress test    Myoview 10/16: EF 50%, diaphragmatic attenuation, no ischemia, low risk  . Hypertension   . Migraine 2012-2014  . Moderate persistent asthma with acute exacerbation 05/02/2018  . PAF (paroxysmal atrial fibrillation) ([place]) 03/27/2015   a. Myoview neg for ischemia >> Flecainide started 10/16 >> FU ETT   . Schizophrenia ([place])   . Sciatica neuralgia   . Small vessel disease ([place])    Right basal ganglia stroke  . Stroke [facility]) "between 2012-2014"   residual "AF" (09/22/2015)    Past Surgical History:  Procedure Laterality Date  . ATRIAL FIBRILLATION ABLATION  09/22/2015  . CYST EXCISION  1996-97   surgery back of head   . ELECTROPHYSIOLOGIC STUDY N/A 09/22/2015   Procedure: Atrial Fibrillation Ablation;  Surgeon: Will [name], MD;  Location: [place] [facility];  Service: Cardiovascular;  Laterality: N/A;  . ELECTROPHYSIOLOGIC STUDY N/A 12/10/2015   Procedure: Atrial Fibrillation Ablation;  Surgeon: Will [name], MD;  Location: [place] [facility];  Service: Cardiovascular;  Laterality: N/A;  . ELECTROPHYSIOLOGIC STUDY N/A 12/11/2015   Procedure: Cardioversion;  Surgeon: Will [name], MD;  Location: [place] [facility];  Service: Cardiovascular;  Laterality: N/A;  . EXCISION MASS HEAD N/A 01/06/2017   Procedure: EXCISION MASS FOREHEAD;  Surgeon: Irene Limbo, MD;  Location: [place];  Service: Plastics;  Laterality: N/A;  . GANGLION CYST EXCISION Left   . INTERCOSTAL NERVE BLOCK  2005  . KNEE ARTHROSCOPY Right 2016    There were no vitals filed for this visit.  Subjective Assessment - 04/15/19 0907    Subjective  Relays 9/10 pain in his neck and back today.Denies radiculopathy today    Pertinent History  PMH: chronic LBP, bilat CTS, anx, bipolar,dep, a-fib, GERD, alcohol abuse, HTN,migranes, asthma,schizophrenia,sciatica, CVA    Limitations  Sitting;Reading;Lifting;Standing;Walking    Diagnostic tests  Abnormal MRI scan of cervical spine showing prominent spondylitic change at C3-4 with moderate left sided foraminal narrowing. C4-5 and C5-6 also shows similar but milder changes. Compared with previous MRI from 10/08/2014 these changes appear to have  progressed   Per pt report 03/19/2019, he has not seen MD (to see Dr. Jannifer Franklin 05/2019) or had any additional scans done   Patient Stated Goals  Get nerve back under control so I can move    Pain Onset  More than a month ago    Pain Onset  More than a month ago         [facility] PT Assessment - 04/15/19 0001      Assessment   Medical Diagnosis  Neck pain    Referring Provider (PT)  Marcial Pacas    Next MD Visit  to see Dr. Jannifer Franklin in November, has not seen since 08/14/2018      AROM   Cervical Flexion  30    Cervical Extension  50    Cervical - Right Side  Bend  20    Cervical - Left Side Bend  25    Cervical - Right Rotation  40    Cervical - Left Rotation  35                   [facility] Adult PT Treatment/Exercise - 04/15/19 0001      Exercises   Other Exercises   UT stretch and levator stretch 30 sec X 2 bilat for neck, cervical rotation and extension SNAG with towel 10 reps X 5 sec , chin tucks X 15 reps. For strengthening performed rows and ext with green X 15 ea, then Horizontal abd and bilat ER with red X 15 ea      Modalities   Modalities  Electrical Stimulation;Moist Heat      Moist Heat Therapy   Number Minutes Moist Heat  15 Minutes    Moist Heat Location  Lumbar Spine;Cervical      Electrical Stimulation   Electrical Stimulation Location  posterior neck    Electrical Stimulation Action  IFC    Electrical Stimulation Parameters  tolerance in sitting    Electrical Stimulation Goals  Pain               PT Short Term Goals - 04/15/19 0927      PT SHORT TERM GOAL #1   Title  Pt will be independent with HEP for postural stretching and strengthening, for improved management of cervical pain.  TARGET 3 weeks:  04/19/2019    Baseline  He relays compliance and has no questions    Time  3    Period  Weeks    Status  Achieved    Target Date  04/19/19      PT SHORT TERM GOAL #2   Title  Pt will relay at least 2 new ways to manage his neck pain at home including stretching, heat/ice, TENS, gentle motion, relaxation techniques.    Baseline  relays walking, and stretching helps    Time  3    Period  Weeks    Status  Achieved    Target Date  04/19/19      PT SHORT TERM GOAL #3   Title  Pt will demonstrate improved cervical rotation by 5 degrees for improved flexibility, decreased pain    Baseline  32 degrees R and L rotation on eval, measured today 04/15/19 and had 40 deg rotation to Lt and 35 deg rotation to Rt    Time  3    Period  Weeks    Status  Partially Met    Target Date  04/19/19        PT Long  Term Goals - 03/20/19 7001      PT LONG TERM GOAL #1   Title  Pt will improve neck ROM by 10-12 degrees in all planes of movement, for improved flexibility, activity participation.  TARGET 6 weeks:  05/17/2019    Baseline  Rand L rotation 32 degrees, extension 10 degrees at eval    Time  6    Period  Weeks    Target Date  05/17/19      PT LONG TERM GOAL #2   Title  Pt will report improvement (at worst pain) on NDI to less than or equal to 70% disability.    Baseline  76% disability (at worst rating) on NDI    Time  6    Period  Weeks    Status  New    Target Date  05/17/19      PT LONG TERM GOAL #3   Title  Pt will verbalize/demonstrate plans for ongoing exercise routine for pain management, improved participation in leisure activities.    Baseline  Pt not performing fitness/exercise routine, leisure activities due to pain    Time  6    Period  Weeks    Status  New    Target Date  05/17/19            Plan - 04/15/19 0930    Clinical Impression Statement  He returns for first time since eval on 03/20/19. His measurements were updated with reflect some improvement in ROM but still limited in this area.He continues to report high pain levels and recieved MHP with TENS to decrease pain. He has met 2/3 STG so far. He was shown horizontal abd and bilat ER strengthening to add to his program at home. Prognosis fair at this time.    Personal Factors and Comorbidities  Past/Current Experience;Comorbidity 3+    Comorbidities  PMH: chronic LBP, bilat CTS, anx, bipolar,dep, a-fib, GERD, alcohol abuse, HTN,migranes, asthma,schizophrenia,sciatica, CVA    Examination-Activity Limitations  Hygiene/Grooming;Reach Overhead    Examination-Participation Restrictions  Driving;Meal Prep;Cleaning;Community Activity   Fishing, playing with grandkids   Stability/Clinical Decision Making  Evolving/Moderate complexity    Rehab Potential  Fair    PT Frequency  1x / week    PT Duration  6 weeks   plus  eval   PT Treatment/Interventions  Cryotherapy;Electrical Stimulation;Iontophoresis 58m/ml Dexamethasone;Moist Heat;Traction;Ultrasound;Therapeutic activities;Therapeutic exercise;Neuromuscular re-education;Manual techniques;Passive range of motion;Dry needling;Taping;Spinal Manipulations;Joint Manipulations;ADLs/Self Care Home Management    PT Next Visit Plan  modalities/manual therapy for pain; ?TENS for cervical spine    PT Home Exercise Plan  [id number], UT and levator stretch, doorway stretch, cerv SNAG ext and rotation with towel, chin tucks, rows and ext with green band, added H abd, and bilat ER with band    Consulted and Agree with Plan of Care  Patient       Patient will benefit from skilled therapeutic intervention in order to improve the following deficits and impairments:  Decreased activity tolerance, Impaired flexibility, Decreased range of motion, Decreased strength, Hypomobility, Increased muscle spasms, Postural dysfunction  Visit Diagnosis: Cervicalgia  Abnormal posture  Other symptoms and signs involving the musculoskeletal system     Problem List Patient Active Problem List   Diagnosis Date Noted  . Neck pain on left side 03/06/2019  . Musculoskeletal chest pain 07/24/2018  . Asthma, mild intermittent 05/02/2018  . Allergic rhinitis caused by mold 05/02/2018  . Tobacco use 05/02/2018  . Bilateral carpal tunnel syndrome 01/10/2018  . Stroke ([place]   .  Schizophrenia ([place])   . Migraine   . History of nuclear stress test   . History of alcohol abuse   . GERD (gastroesophageal reflux disease)   . Dysrhythmia   . Chronic lower back pain   . Anxiety   . Arthritis of knee, right 04/12/2017  . Bipolar disorder ([place]) 04/03/2017  . Lipoma of forehead 09/22/2016  . Trigger ring finger of right hand 06/22/2016  . Paroxysmal atrial fibrillation (H[facility])   . AF (atrial fibrillation) ([place]) 12/10/2015  . Eczema 07/10/2015  . History of CVA (cerebrovascular accident)  05/04/2015  . Tear of medial meniscus of right knee 03/18/2015  . Chronic pain of right knee 03/12/2015  . Other and unspecified hyperlipidemia 11/25/2013  . Nasal congestion 11/25/2013  . Sinusitis, chronic 05/09/2013  . Chronic low back pain 10/12/2012  . Lumbar radiculopathy 10/12/2012  . Hypertension 10/12/2012  . Depression 10/12/2012  . Insomnia 10/12/2012    Silvestre Mesi 04/15/2019, 9:57 AM  [facility] [address] [place], [place], [address] Phone: [phone]   Fax:  [phone]  Name: Jason Stokes MRN: [id number] Date of Birth: 1961-03-26

## 2019-04-16 ENCOUNTER — Other Ambulatory Visit: Payer: Self-pay | Admitting: Family Medicine

## 2019-04-16 DIAGNOSIS — J301 Allergic rhinitis due to pollen: Secondary | ICD-10-CM | POA: Diagnosis not present

## 2019-04-16 NOTE — Progress Notes (Signed)
Vials exp 04-15-20

## 2019-04-19 ENCOUNTER — Ambulatory Visit (INDEPENDENT_AMBULATORY_CARE_PROVIDER_SITE_OTHER): Payer: Medicaid Other | Admitting: *Deleted

## 2019-04-19 DIAGNOSIS — J309 Allergic rhinitis, unspecified: Secondary | ICD-10-CM

## 2019-04-22 ENCOUNTER — Ambulatory Visit: Payer: Medicaid Other | Admitting: Physical Therapy

## 2019-05-02 ENCOUNTER — Ambulatory Visit: Payer: Medicaid Other | Attending: Neurology | Admitting: Physical Therapy

## 2019-05-02 ENCOUNTER — Other Ambulatory Visit: Payer: Self-pay

## 2019-05-02 ENCOUNTER — Encounter: Payer: Self-pay | Admitting: Physical Therapy

## 2019-05-02 DIAGNOSIS — R293 Abnormal posture: Secondary | ICD-10-CM | POA: Diagnosis present

## 2019-05-02 DIAGNOSIS — R29898 Other symptoms and signs involving the musculoskeletal system: Secondary | ICD-10-CM | POA: Insufficient documentation

## 2019-05-02 DIAGNOSIS — M542 Cervicalgia: Secondary | ICD-10-CM | POA: Insufficient documentation

## 2019-05-02 NOTE — Patient Instructions (Addendum)
Levator Scapula Stretch, Sitting    Sit, one hand tucked under hip on side to be stretched, other hand over top of head. Turn head toward other side and look down. Use hand on head to gently stretch neck in that position. Hold ___ seconds. Repeat ___ times per session. Do ___ sessions per day.  Copyright  VHI. All rights reserved.  Seated Stretch: Neck / Shoulder    Reaching behind, place right hand on opposite temple. Gently pull head forward and to the right. Hold __15-30__ seconds. Repeat with other hand. Repeat _3___ times. Do ___1-2_ sessions per day.  http://gt2.exer.us/794   Copyright  VHI. All rights reserved.  Levator Scapula Stretch, Sitting    Sit, one hand on same-side shoulder blade, other hand on head. Gently pull head down and away. Hold _15-30__ seconds. Repeat __3_ times per session. Do _1-2__ sessions per day.  Copyright  VHI. All rights reserved.  Pectoral Stretch    With arms behind *CORNER*, gently lean forward. Stretch is felt across chest. Hold __15-30__ seconds. Repeat __3__ times. Do _1-2___ sessions per day.  http://gt2.exer.us/32   Copyright  VHI. All rights reserved.

## 2019-05-03 ENCOUNTER — Ambulatory Visit (INDEPENDENT_AMBULATORY_CARE_PROVIDER_SITE_OTHER): Payer: Medicaid Other

## 2019-05-03 DIAGNOSIS — J309 Allergic rhinitis, unspecified: Secondary | ICD-10-CM

## 2019-05-05 ENCOUNTER — Encounter: Payer: Self-pay | Admitting: Physical Therapy

## 2019-05-05 NOTE — Therapy (Signed)
[facility] [address] [place], [place], [address] Phone: [phone]   Fax:  [phone]  Physical Therapy Treatment  Patient Details  Name: Jason Stokes MRN: [id number] Date of Birth: [birth year] Referring Provider (PT): Marcial Pacas   Encounter Date: 05/02/2019  PT End of Session - 05/05/19 1032    Visit Number  3   New POC   Number of Visits  12    Date for PT Re-Evaluation  06/18/19    Authorization Type  2 visits 05/02/19-05/15/2019 (Pt's orignial Josem Kaufmann was 3 MCD visits 9/14-10/4, but pt was only seen once during this period due to 2 no-shows; PT requested date extension to cover additional 2 visits)    Authorization - Visit Number  1    Authorization - Number of Visits  2    PT Start Time  1232    PT Stop Time  1316    PT Time Calculation (min)  44 min    Activity Tolerance  Patient tolerated treatment well   Pt rates pain as 8/10 at end of session; down from 10/10 intiially   Behavior During Therapy  [facility] for tasks assessed/performed       Past Medical History:  Diagnosis Date  . Anxiety   . Bilateral carpal tunnel syndrome 01/10/2018  . Bipolar disorder ([place])   . Chronic lower back pain   . Depression   . Dysrhythmia    a-fib  . GERD (gastroesophageal reflux disease)   . History of alcohol abuse   . History of nuclear stress test    Myoview 10/16: EF 50%, diaphragmatic attenuation, no ischemia, low risk  . Hypertension   . Migraine 2012-2014  . Moderate persistent asthma with acute exacerbation 05/02/2018  . PAF (paroxysmal atrial fibrillation) ([place]) 03/27/2015   a. Myoview neg for ischemia >> Flecainide started 10/16 >> FU ETT   . Schizophrenia ([place])   . Sciatica neuralgia   . Small vessel disease ([place])    Right basal ganglia stroke  . Stroke [facility]) "between 2012-2014"   residual "AF" (09/22/2015)    Past Surgical History:  Procedure Laterality Date  . ATRIAL FIBRILLATION ABLATION  09/22/2015  .  CYST EXCISION  1996-97   surgery back of head   . ELECTROPHYSIOLOGIC STUDY N/A 09/22/2015   Procedure: Atrial Fibrillation Ablation;  Surgeon: Will [name], MD;  Location: [place] [facility];  Service: Cardiovascular;  Laterality: N/A;  . ELECTROPHYSIOLOGIC STUDY N/A 12/10/2015   Procedure: Atrial Fibrillation Ablation;  Surgeon: Will [name], MD;  Location: [place] CV [facility];  Service: Cardiovascular;  Laterality: N/A;  . ELECTROPHYSIOLOGIC STUDY N/A 12/11/2015   Procedure: Cardioversion;  Surgeon: Will [name], MD;  Location: [place] [facility];  Service: Cardiovascular;  Laterality: N/A;  . EXCISION MASS HEAD N/A 01/06/2017   Procedure: EXCISION MASS FOREHEAD;  Surgeon: Irene Limbo, MD;  Location: [place];  Service: Plastics;  Laterality: N/A;  . GANGLION CYST EXCISION Left   . INTERCOSTAL NERVE BLOCK  2005  . KNEE ARTHROSCOPY Right 2016    There were no vitals filed for this visit.  Subjective Assessment - 05/05/19 1017    Subjective  Pain never really goes below 10/10.  Just trying to see Dr. Jannifer Franklin at the end of the month.  Having a lot of sciatica pain today, often feel that at this time of the year with the cold weather.    Pertinent History  PMH: chronic LBP, bilat CTS,  anx, bipolar,dep, a-fib, GERD, alcohol abuse, HTN,migranes, asthma,schizophrenia,sciatica, CVA    Limitations  Sitting;Reading;Lifting;Standing;Walking    Diagnostic tests  Abnormal MRI scan of cervical spine showing prominent spondylitic change at C3-4 with moderate left sided foraminal narrowing. C4-5 and C5-6 also shows similar but milder changes. Compared with previous MRI from 10/08/2014 these changes appear to have progressed   Per pt report 03/19/2019, he has not seen MD (to see Dr. Jannifer Franklin 05/2019) or had any additional scans done   Patient Stated Goals  Get nerve back under control so I can move    Currently in Pain?  Yes    Pain Score  10-Worst pain ever    Pain  Location  Neck    Pain Orientation  Right;Left   Pt c/o pain R>L today   Pain Descriptors / Indicators  Aching;Burning    Pain Type  Chronic pain    Pain Onset  More than a month ago    Pain Frequency  Constant    Aggravating Factors   weather changes    Pain Relieving Factors  TENS, heat/ice    Pain Onset  More than a month ago                       [facility] Adult PT Treatment/Exercise - 05/05/19 1018      Self-Care   Self-Care  Other Self-Care Comments    Other Self-Care Comments   Discussed pt's hx of chronic pain (also previously discussed in previous bouts of therapy); that combination of self-massage (with use of theracane-provided instructions on how to obtain), regular stretching and postural strengthening cane help to consistently bring pain down to a more manageable level (today pain rated 8/10 at end of session,down from 10/10).  Pt wants to know how to obtain new electrodes for his home TENS unit-educated pt that he should be able to order new electrodes and needs to look at brand of his home TENS unit for ordering information.      Neck Exercises: Seated   Neck Retraction  5 reps    Neck Retraction Limitations  with cues; pt c/o increased pain.    Cervical Rotation  10 reps;Right;Left    Cervical Rotation Limitations  Instructed patient on adding slight end range overpressure with neck rotation for added stretch    Shoulder Rolls  Backwards;10 reps, 2 sets   Other Seated Exercise  Seated upper traps stretch, seated levator stretch, 3 reps each, 15-30 seconds.        Shoulder Exercises: Standing   Other Standing Exercises  Pt return demo of form (without use of theraband), for standing scapular retraction x 5 reps, standing horizontal abduction x 5 reps      Shoulder Exercises: Stretch   Corner Stretch  3 reps;30 seconds    Corner Stretch Limitations  in corner, cues for hand placement to avoid shoulder pain      Manual Therapy   Manual Therapy  Soft tissue  mobilization    Manual therapy comments  Performed with pt in sitting position for pain relief and improved flexibility    Soft tissue mobilization  bilateral upper traps, scalenes, SCM with passive stretch and massage to trigger point areas.  Instructed patient in use of and pt able to demonstrate use of theracane for self-massage to upper/mid traps, 2 minutes each side with no increase in pain.      Additional therex: Seated cervical sidebending 5 reps each, R and L, with gentle  overpressures at end range x 3 Shoulder elevation, depression, 2 sets x 5 reps        PT Education - 05/05/19 1031    Education Details  Additional exercises to HEP; pain science education-combination of self-massage, stretch, strengthening to manage pain    Person(s) Educated  Patient    Methods  Explanation;Demonstration    Comprehension  Verbalized understanding;Returned demonstration       PT Short Term Goals - 04/15/19 0927      PT SHORT TERM GOAL #1   Title  Pt will be independent with HEP for postural stretching and strengthening, for improved management of cervical pain.  TARGET 3 weeks:  04/19/2019    Baseline  He relays compliance and has no questions    Time  3    Period  Weeks    Status  Achieved    Target Date  04/19/19      PT SHORT TERM GOAL #2   Title  Pt will relay at least 2 new ways to manage his neck pain at home including stretching, heat/ice, TENS, gentle motion, relaxation techniques.    Baseline  relays walking, and stretching helps    Time  3    Period  Weeks    Status  Achieved    Target Date  04/19/19      PT SHORT TERM GOAL #3   Title  Pt will demonstrate improved cervical rotation by 5 degrees for improved flexibility, decreased pain    Baseline  32 degrees R and L rotation on eval, measured today 04/15/19 and had 40 deg rotation to Lt and 35 deg rotation to Rt    Time  3    Period  Weeks    Status  Partially Met    Target Date  04/19/19        PT Long Term  Goals - 03/20/19 1027      PT LONG TERM GOAL #1   Title  Pt will improve neck ROM by 10-12 degrees in all planes of movement, for improved flexibility, activity participation.  TARGET 6 weeks:  05/17/2019    Baseline  Rand L rotation 32 degrees, extension 10 degrees at eval    Time  6    Period  Weeks    Target Date  05/17/19      PT LONG TERM GOAL #2   Title  Pt will report improvement (at worst pain) on NDI to less than or equal to 70% disability.    Baseline  76% disability (at worst rating) on NDI    Time  6    Period  Weeks    Status  New    Target Date  05/17/19      PT LONG TERM GOAL #3   Title  Pt will verbalize/demonstrate plans for ongoing exercise routine for pain management, improved participation in leisure activities.    Baseline  Pt not performing fitness/exercise routine, leisure activities due to pain    Time  6    Period  Weeks    Status  New    Target Date  05/17/19            Plan - 05/05/19 1036    Clinical Impression Statement  Pt returns for first visit since 04/15/2019 (due to awaiting reauth for Medicaid visits, since pt seen only once during initial Medicaid auth period).  Pt's pain rating decreases from 10/10>8/10 at end of session.  Focus of today's session on how patient can  manage pain on his own, incuding ways to self-massage painful muscular trigger points, as well as importance of consistently performing stretching and postural stregnthening exercises.  Added stretches to HEP (but note that pt was given these stretches earlier this year in previous POC).  Will fully assess goals next visit to determine further POC.    Personal Factors and Comorbidities  Past/Current Experience;Comorbidity 3+    Comorbidities  PMH: chronic LBP, bilat CTS, anx, bipolar,dep, a-fib, GERD, alcohol abuse, HTN,migranes, asthma,schizophrenia,sciatica, CVA    Examination-Activity Limitations  Hygiene/Grooming;Reach Overhead    Examination-Participation Restrictions   Driving;Meal Prep;Cleaning;Community Activity   Fishing, playing with grandkids   Stability/Clinical Decision Making  Evolving/Moderate complexity    Rehab Potential  Fair    PT Frequency  1x / week    PT Duration  6 weeks   plus eval   PT Treatment/Interventions  Cryotherapy;Electrical Stimulation;Iontophoresis 22m/ml Dexamethasone;Moist Heat;Traction;Ultrasound;Therapeutic activities;Therapeutic exercise;Neuromuscular re-education;Manual techniques;Passive range of motion;Dry needling;Taping;Spinal Manipulations;Joint Manipulations;ADLs/Self Care Home Management    PT Next Visit Plan  Check if patient obtained theracane for manual self-massage to upper/mid traps; if not, educate in use of tennis ball for self-massage; check HEP, ROM measures.  Check LTGs and determine if renew versus d/c (will need to submit for Medicaid and updated POC likely)    PT Home Exercise Plan  [id number], UT and levator stretch, doorway stretch, cerv SNAG ext and rotation with towel, chin tucks, rows and ext with green band, added H abd, and bilat ER with band    Consulted and Agree with Plan of Care  Patient       Patient will benefit from skilled therapeutic intervention in order to improve the following deficits and impairments:  Decreased activity tolerance, Impaired flexibility, Decreased range of motion, Decreased strength, Hypomobility, Increased muscle spasms, Postural dysfunction  Visit Diagnosis: Cervicalgia  Abnormal posture  Other symptoms and signs involving the musculoskeletal system     Problem List Patient Active Problem List   Diagnosis Date Noted  . Neck pain on left side 03/06/2019  . Musculoskeletal chest pain 07/24/2018  . Asthma, mild intermittent 05/02/2018  . Allergic rhinitis caused by mold 05/02/2018  . Tobacco use 05/02/2018  . Bilateral carpal tunnel syndrome 01/10/2018  . Stroke ([place]   . Schizophrenia ([place]   . Migraine   . History of nuclear stress test   . History of  alcohol abuse   . GERD (gastroesophageal reflux disease)   . Dysrhythmia   . Chronic lower back pain   . Anxiety   . Arthritis of knee, right 04/12/2017  . Bipolar disorder ([place] 04/03/2017  . Lipoma of forehead 09/22/2016  . Trigger ring finger of right hand 06/22/2016  . Paroxysmal atrial fibrillation ([facility])   . AF (atrial fibrillation) ([place] 12/10/2015  . Eczema 07/10/2015  . History of CVA (cerebrovascular accident) 05/04/2015  . Tear of medial meniscus of right knee 03/18/2015  . Chronic pain of right knee 03/12/2015  . Other and unspecified hyperlipidemia 11/25/2013  . Nasal congestion 11/25/2013  . Sinusitis, chronic 05/09/2013  . Chronic low back pain 10/12/2012  . Lumbar radiculopathy 10/12/2012  . Hypertension 10/12/2012  . Depression 10/12/2012  . Insomnia 10/12/2012    Jason Stokes W. 05/05/2019, 10:41 AM  MFrazier Butt, PT   [address] [address]Phone: [phone]  Fax:  [phone] Name: Jason SIRICOMRN: [id number] of Birth: 802-21-1962

## 2019-05-13 ENCOUNTER — Ambulatory Visit: Payer: Medicaid Other | Admitting: Physical Therapy

## 2019-05-13 ENCOUNTER — Other Ambulatory Visit: Payer: Self-pay

## 2019-05-13 DIAGNOSIS — M542 Cervicalgia: Secondary | ICD-10-CM | POA: Diagnosis not present

## 2019-05-13 DIAGNOSIS — R29898 Other symptoms and signs involving the musculoskeletal system: Secondary | ICD-10-CM

## 2019-05-13 DIAGNOSIS — R293 Abnormal posture: Secondary | ICD-10-CM

## 2019-05-13 NOTE — Therapy (Signed)
[facility] [address] [place], [place], [address] Phone: [phone]   Fax:  [phone]  Physical Therapy Treatment/Discharge PHYSICAL THERAPY DISCHARGE SUMMARY  Visits from Start of Care: 4  Current functional level related to goals / functional outcomes: See below   Remaining deficits: Pain and neck tighntess   Education / Equipment: HEP  Plan: Patient agrees to discharge.  Patient goals were not met. Patient is being discharged due to lack of progress.  ?????       Patient Details  Name: Jason Stokes MRN: [id number] Date of Birth: 17-May-1961 Referring Provider (PT): Marcial Pacas   Encounter Date: 05/13/2019  PT End of Session - 05/13/19 1105    Visit Number  4    Number of Visits  12    Date for PT Re-Evaluation  06/18/19    Authorization Type  2 visits 05/02/19-05/15/2019 (Pt's orignial Josem Kaufmann was 3 MCD visits 9/14-10/4, but pt was only seen once during this period due to 2 no-shows; PT requested date extension to cover additional 2 visits)    Authorization - Visit Number  2    Authorization - Number of Visits  2    PT Start Time  1015    PT Stop Time  1100    PT Time Calculation (min)  45 min    Activity Tolerance  Patient tolerated treatment well   Pt rates pain as 8/10 at end of session; down from 10/10 intiially   Behavior During Therapy  [facility] for tasks assessed/performed       Past Medical History:  Diagnosis Date  . Anxiety   . Bilateral carpal tunnel syndrome 01/10/2018  . Bipolar disorder ([place])   . Chronic lower back pain   . Depression   . Dysrhythmia    a-fib  . GERD (gastroesophageal reflux disease)   . History of alcohol abuse   . History of nuclear stress test    Myoview 10/16: EF 50%, diaphragmatic attenuation, no ischemia, low risk  . Hypertension   . Migraine 2012-2014  . Moderate persistent asthma with acute exacerbation 05/02/2018  . PAF (paroxysmal atrial fibrillation)  ([place]) 03/27/2015   a. Myoview neg for ischemia >> Flecainide started 10/16 >> FU ETT   . Schizophrenia ([place])   . Sciatica neuralgia   . Small vessel disease ([place])    Right basal ganglia stroke  . Stroke [facility]) "between 2012-2014"   residual "AF" (09/22/2015)    Past Surgical History:  Procedure Laterality Date  . ATRIAL FIBRILLATION ABLATION  09/22/2015  . CYST EXCISION  1996-97   surgery back of head   . ELECTROPHYSIOLOGIC STUDY N/A 09/22/2015   Procedure: Atrial Fibrillation Ablation;  Surgeon: Will [name], MD;  Location: [place] [facility];  Service: Cardiovascular;  Laterality: N/A;  . ELECTROPHYSIOLOGIC STUDY N/A 12/10/2015   Procedure: Atrial Fibrillation Ablation;  Surgeon: Will [name], MD;  Location: [place] [facility];  Service: Cardiovascular;  Laterality: N/A;  . ELECTROPHYSIOLOGIC STUDY N/A 12/11/2015   Procedure: Cardioversion;  Surgeon: Will [name], MD;  Location: [place] [facility];  Service: Cardiovascular;  Laterality: N/A;  . EXCISION MASS HEAD N/A 01/06/2017   Procedure: EXCISION MASS FOREHEAD;  Surgeon: Irene Limbo, MD;  Location: [place];  Service: Plastics;  Laterality: N/A;  . GANGLION CYST EXCISION Left   . INTERCOSTAL NERVE BLOCK  2005  . KNEE ARTHROSCOPY Right 2016    There were no vitals filed for this visit.  Subjective Assessment - 05/13/19 1038    Subjective  pain still 10/10 all over, He will see MD next week. And PT recommends discontinueing PT as we are not able to improve his pain overall. He knows exercises and stretches to do at home now.    Pertinent History  PMH: chronic LBP, bilat CTS, anx, bipolar,dep, a-fib, GERD, alcohol abuse, HTN,migranes, asthma,schizophrenia,sciatica, CVA    Limitations  Sitting;Reading;Lifting;Standing;Walking    Diagnostic tests  Abnormal MRI scan of cervical spine showing prominent spondylitic change at C3-4 with moderate left sided foraminal narrowing. C4-5 and C5-6 also shows  similar but milder changes. Compared with previous MRI from 10/08/2014 these changes appear to have progressed   Per pt report 03/19/2019, he has not seen MD (to see Dr. Jannifer Franklin 05/2019) or had any additional scans done   Patient Stated Goals  Get nerve back under control so I can move    Pain Score  10-Worst pain ever    Pain Location  --   neck and back   Pain Onset  More than a month ago    Pain Onset  More than a month ago         [facility] PT Assessment - 05/13/19 0001      Assessment   Medical Diagnosis  Neck pain    Next MD Visit  to see Dr. Jannifer Franklin next week      AROM   Cervical Flexion  30    Cervical Extension  30    Cervical - Right Side Bend  20    Cervical - Left Side Bend  25    Cervical - Right Rotation  40    Cervical - Left Rotation  55                   [facility] Adult PT Treatment/Exercise - 05/13/19 0001      Self-Care   Other Self-Care Comments   Discussed pt's hx of chronic pain (also previously discussed in previous bouts of therapy); that combination of self-massage (with use of theracane-provided instructions on how to obtain and had him perform 3 min each side of his neck), regular stretching and postural strengthening cane help to consistently bring pain down to a more manageable level       Exercises   Other Exercises   UT stretch and levator stretch 30 sec X 2 bilat for neck, scap retraction X 20 reps, cervical rotation and extension SNAG with towel 10 reps X 5 sec , chin tucks X 15 reps.  neck AROM 10 reps for sidebending, shoulder rolls X 10 reps, bilat ER squeeze X 10 reps      Moist Heat Therapy   Number Minutes Moist Heat  15 Minutes    Moist Heat Location  Lumbar Spine;Cervical      Electrical Stimulation   Electrical Stimulation Location  posterior neck    Electrical Stimulation Action  IFC    Electrical Stimulation Parameters  tolernace in sitting    Electrical Stimulation Goals  Pain             PT Education - 05/13/19 1104     Education Details  thereacane review, HEP review, POC to discharge from PT and refer back to MD for pain managment    Person(s) Educated  Patient    Methods  Explanation    Comprehension  Verbalized understanding       PT Short Term Goals - 04/15/19 3903      PT  SHORT TERM GOAL #1   Title  Pt will be independent with HEP for postural stretching and strengthening, for improved management of cervical pain.  TARGET 3 weeks:  04/19/2019    Baseline  He relays compliance and has no questions    Time  3    Period  Weeks    Status  Achieved    Target Date  04/19/19      PT SHORT TERM GOAL #2   Title  Pt will relay at least 2 new ways to manage his neck pain at home including stretching, heat/ice, TENS, gentle motion, relaxation techniques.    Baseline  relays walking, and stretching helps    Time  3    Period  Weeks    Status  Achieved    Target Date  04/19/19      PT SHORT TERM GOAL #3   Title  Pt will demonstrate improved cervical rotation by 5 degrees for improved flexibility, decreased pain    Baseline  32 degrees R and L rotation on eval, measured today 04/15/19 and had 40 deg rotation to Lt and 35 deg rotation to Rt    Time  3    Period  Weeks    Status  Partially Met    Target Date  04/19/19        PT Long Term Goals - 05/13/19 1108      PT LONG TERM GOAL #1   Title  Pt will improve neck ROM by 10-12 degrees in all planes of movement, for improved flexibility, activity participation.  TARGET 6 weeks:  05/17/2019    Baseline  partially met    Time  6    Period  Weeks    Status  Partially Met      PT LONG TERM GOAL #2   Title  Pt will report improvement (at worst pain) on NDI to less than or equal to 70% disability.    Baseline  76% disability (at worst rating) on NDI    Time  6    Period  Weeks    Status  Not Met      PT LONG TERM GOAL #3   Title  Pt will verbalize/demonstrate plans for ongoing exercise routine for pain management, improved participation in leisure  activities.    Baseline  Pt not performing fitness/exercise routine, leisure activities due to pain    Time  6    Period  Weeks    Status  Achieved      PT LONG TERM GOAL #4   Title  Patient will demonstrate independence and compliance with cervical ROM and postural HEP    Baseline  Performs HEP about 50% of the time    Status  Partially Met            Plan - 05/13/19 1106    Clinical Impression Statement  He is at his end of POC for approaved medicaid PT visits. He continues to report 10/10 pain and his ROM measurements mostly the same (except some improvement in Lt rotation ROM). PT recommending discharge due to lack of progress and continued pain and will refer back to his MD for pain managment. Self care education performed with him today for self massage techniques with therecane and continueing HEP and TENS for pain and ROM. He had no further questions or concerns and agrees to discharge.    Personal Factors and Comorbidities  Past/Current Experience;Comorbidity 3+    Comorbidities  PMH: chronic LBP, bilat  CTS, anx, bipolar,dep, a-fib, GERD, alcohol abuse, HTN,migranes, asthma,schizophrenia,sciatica, CVA    Examination-Activity Limitations  Hygiene/Grooming;Reach Overhead    Examination-Participation Restrictions  Driving;Meal Prep;Cleaning;Community Activity   Fishing, playing with grandkids   Stability/Clinical Decision Making  Evolving/Moderate complexity    Rehab Potential  Fair    PT Frequency  1x / week    PT Duration  6 weeks   plus eval   PT Treatment/Interventions  Cryotherapy;Electrical Stimulation;Iontophoresis 25m/ml Dexamethasone;Moist Heat;Traction;Ultrasound;Therapeutic activities;Therapeutic exercise;Neuromuscular re-education;Manual techniques;Passive range of motion;Dry needling;Taping;Spinal Manipulations;Joint Manipulations;ADLs/Self Care Home Management    PT Home Exercise Plan  [id number], [place] and levator stretch, doorway stretch, cerv SNAG ext and rotation  with towel, chin tucks, rows and ext with green band, added H abd, and bilat ER with band    Consulted and Agree with Plan of Care  Patient       Patient will benefit from skilled therapeutic intervention in order to improve the following deficits and impairments:  Decreased activity tolerance, Impaired flexibility, Decreased range of motion, Decreased strength, Hypomobility, Increased muscle spasms, Postural dysfunction  Visit Diagnosis: Cervicalgia  Abnormal posture  Other symptoms and signs involving the musculoskeletal system     Problem List Patient Active Problem List   Diagnosis Date Noted  . Neck pain on left side 03/06/2019  . Musculoskeletal chest pain 07/24/2018  . Asthma, mild intermittent 05/02/2018  . Allergic rhinitis caused by mold 05/02/2018  . Tobacco use 05/02/2018  . Bilateral carpal tunnel syndrome 01/10/2018  . Stroke ([place]   . Schizophrenia ([place]   . Migraine   . History of nuclear stress test   . History of alcohol abuse   . GERD (gastroesophageal reflux disease)   . Dysrhythmia   . Chronic lower back pain   . Anxiety   . Arthritis of knee, right 04/12/2017  . Bipolar disorder ([place] 04/03/2017  . Lipoma of forehead 09/22/2016  . Trigger ring finger of right hand 06/22/2016  . Paroxysmal atrial fibrillation ([facility])   . AF (atrial fibrillation) ([place] 12/10/2015  . Eczema 07/10/2015  . History of CVA (cerebrovascular accident) 05/04/2015  . Tear of medial meniscus of right knee 03/18/2015  . Chronic pain of right knee 03/12/2015  . Other and unspecified hyperlipidemia 11/25/2013  . Nasal congestion 11/25/2013  . Sinusitis, chronic 05/09/2013  . Chronic low back pain 10/12/2012  . Lumbar radiculopathy 10/12/2012  . Hypertension 10/12/2012  . Depression 10/12/2012  . Insomnia 10/12/2012    BDebbe Odea10/26/2020, 11:10 AM  [facility]  [address]Phone: [phone]  Fax:  [phone] Name: HWYAT INFINGERMRN: [id number] of Birth: [birth year]

## 2019-05-17 ENCOUNTER — Ambulatory Visit (INDEPENDENT_AMBULATORY_CARE_PROVIDER_SITE_OTHER): Payer: Medicaid Other

## 2019-05-17 DIAGNOSIS — J309 Allergic rhinitis, unspecified: Secondary | ICD-10-CM | POA: Diagnosis not present

## 2019-05-31 ENCOUNTER — Ambulatory Visit (INDEPENDENT_AMBULATORY_CARE_PROVIDER_SITE_OTHER): Payer: Medicaid Other | Admitting: *Deleted

## 2019-05-31 DIAGNOSIS — J309 Allergic rhinitis, unspecified: Secondary | ICD-10-CM | POA: Diagnosis not present

## 2019-06-11 ENCOUNTER — Ambulatory Visit: Payer: Medicaid Other | Admitting: Neurology

## 2019-06-11 ENCOUNTER — Encounter: Payer: Self-pay | Admitting: Neurology

## 2019-06-11 ENCOUNTER — Other Ambulatory Visit: Payer: Self-pay

## 2019-06-11 VITALS — BP 129/86 | HR 87 | Temp 97.8°F | Wt 260.5 lb

## 2019-06-11 DIAGNOSIS — G8929 Other chronic pain: Secondary | ICD-10-CM | POA: Diagnosis not present

## 2019-06-11 DIAGNOSIS — M5441 Lumbago with sciatica, right side: Secondary | ICD-10-CM

## 2019-06-11 MED ORDER — METHOCARBAMOL 500 MG PO TABS: 500.0000 mg | ORAL_TABLET | Freq: Three times a day (TID) | ORAL | 1 refills | Status: DC

## 2019-06-11 NOTE — Progress Notes (Signed)
Reason for visit: Low back pain  Jason Stokes is an 58 y.o. male  History of present illness:  Jason Stokes is a 57 year old right-handed black male with a history of chronic low back pain and some neck discomfort.  The patient has just recently completed physical therapy for the neck and back in the last several weeks, he had recurrence of his low back pain spontaneously 2 days ago.  He is having muscle spasms in the low back with some discomfort going down the left leg with some numbness in the left leg.  This is similar to his previous episodes.  The patient last had MRI of the lumbar spine in 2019.  The patient claims that in the past a trial on steroids has resulted in atrial fibrillation.  He returns for an evaluation.  He has oxycodone at home to take if he needs a pain reliever.  Past Medical History:  Diagnosis Date   Anxiety    Bilateral carpal tunnel syndrome 01/10/2018   Bipolar disorder ([facility])    Chronic lower back pain    Depression    Dysrhythmia    a-fib   GERD (gastroesophageal reflux disease)    History of alcohol abuse    History of nuclear stress test    Myoview 10/16: EF 50%, diaphragmatic attenuation, no ischemia, low risk   Hypertension    Migraine 2012-2014   Moderate persistent asthma with acute exacerbation 05/02/2018   PAF (paroxysmal atrial fibrillation) ([place]) 03/27/2015   a. Myoview neg for ischemia >> Flecainide started 10/16 >> FU ETT    Schizophrenia ([place])    Sciatica neuralgia    Small vessel disease ([place])    Right basal ganglia stroke   Stroke [facility]) "between 2012-2014"   residual "AF" (09/22/2015)    Past Surgical History:  Procedure Laterality Date   ATRIAL FIBRILLATION ABLATION  09/22/2015   CYST EXCISION  1996-97   surgery back of head    ELECTROPHYSIOLOGIC STUDY N/A 09/22/2015   Procedure: Atrial Fibrillation Ablation;  Surgeon: Will [name], MD;  Location: [place] [facility];  Service: Cardiovascular;  Laterality:  N/A;   ELECTROPHYSIOLOGIC STUDY N/A 12/10/2015   Procedure: Atrial Fibrillation Ablation;  Surgeon: Will [name], MD;  Location: [place] [facility];  Service: Cardiovascular;  Laterality: N/A;   ELECTROPHYSIOLOGIC STUDY N/A 12/11/2015   Procedure: Cardioversion;  Surgeon: Will [name], MD;  Location: [place] [facility];  Service: Cardiovascular;  Laterality: N/A;   EXCISION MASS HEAD N/A 01/06/2017   Procedure: EXCISION MASS FOREHEAD;  Surgeon: Irene Limbo, MD;  Location: [place];  Service: Plastics;  Laterality: N/A;   GANGLION CYST EXCISION Left    INTERCOSTAL NERVE BLOCK  2005   KNEE ARTHROSCOPY Right 2016    Family History  Problem Relation Age of Onset   Heart disease Father    Schizophrenia Sister     Social history:  reports that he has quit smoking. He quit after 29.00 years of use. He has never used smokeless tobacco. He reports current alcohol use. He reports current drug use. Drug: Marijuana.    Allergies  Allergen Reactions   Lisinopril Anaphylaxis    Swelling of lips and tongue.    Medications:  Prior to Admission medications   Medication Sig Start Date End Date Taking? Authorizing Provider  albuterol (PROAIR HFA) 108 (90 Base) MCG/ACT inhaler Inhale 2 puffs into the lungs every 4 (four) hours as needed for wheezing or shortness of breath.  01/01/19  Yes Elsie Stain, MD  apixaban (ELIQUIS) 5 MG TABS tablet Take 1 tablet (5 mg total) by mouth 2 (two) times daily. 08/14/18  Yes Newlin, Charlane Ferretti, MD  Azelastine HCl 0.15 % SOLN Place 1 spray into both nostrils 2 (two) times daily. 10/23/18  Yes Elsie Stain, MD  carvedilol (COREG) 12.5 MG tablet Take 1 tablet (12.5 mg total) by mouth 2 (two) times daily. 08/14/18  Yes Charlott Rakes, MD  cetirizine (ZYRTEC) 10 MG tablet Take 1 tablet (10 mg total) by mouth daily. 01/01/19  Yes Elsie Stain, MD  CYMBALTA 30 MG capsule Take by mouth daily. 04/04/19  Yes [provider]  diltiazem (CARDIZEM CD) 300 MG 24 hr capsule Take 1 capsule (300 mg total) by mouth daily. 01/17/19  Yes Camnitz, Will Hassell Done, MD  dronedarone (MULTAQ) 400 MG tablet Take 1 tablet (400 mg total) by mouth 2 (two) times daily with a meal. 02/21/18  Yes Camnitz, Ocie Doyne, MD  erythromycin ophthalmic ointment Place a 1/2 inch ribbon of ointment into the lower eyelid 3 times a day until symptoms resolve. 02/24/19  Yes Yu, Amy V, PA-C  fluticasone (FLONASE) 50 MCG/ACT nasal spray Two sprays in each nostril twice a day for 5 days then reduce to once a day two sprays each nostril 01/01/19  Yes Elsie Stain, MD  hydrALAZINE (APRESOLINE) 100 MG tablet Take 1 tablet (100 mg total) by mouth 3 (three) times daily. 04/16/18  Yes Camnitz, Will Hassell Done, MD  Olopatadine HCl (PAZEO) 0.7 % SOLN Place 1 drop into both eyes 2 (two) times a day. 01/01/19  Yes Elsie Stain, MD  omeprazole (PRILOSEC) 20 MG capsule TAKE 1 CAPSULE BY MOUTH EVERY DAY 04/16/19  Yes Elsie Stain, MD  OXcarbazepine (TRILEPTAL) 150 MG tablet Take 1 tablet (150 mg total) by mouth 2 (two) times daily. 06/20/18  Yes Kathrynn Ducking, MD  Oxycodone HCl 10 MG TABS TAKE 1 (ONE) TABLET FOUR TIMES DAILY, AS NEEDED 12/07/18  Yes [provider]  prazosin (MINIPRESS) 1 MG capsule TAKE 1 CAPSULE BY MOUTH EVERYDAY AT BEDTIME 09/04/17  Yes [provider]  pregabalin (LYRICA) 75 MG capsule Take 1 capsule by mouth 4 (four) times daily as needed. 09/17/18  Yes [provider]  tiZANidine (ZANAFLEX) 4 MG tablet Take 1 tablet (4 mg total) by mouth every 8 (eight) hours as needed for muscle spasms. 01/31/18  Yes Charlott Rakes, MD  pregabalin (LYRICA) 25 MG capsule 1 capsule PO 4 times a day 04/04/19   [provider]    ROS:  Out of a complete 14 system review of symptoms, the patient complains only of the following symptoms, and all other reviewed systems are negative.  Back pain, left leg discomfort Neck  stiffness  Blood pressure 129/86, pulse 87, temperature 97.8 F (36.6 C), weight 260 lb 8 oz (118.2 kg).  Physical Exam  General: The patient is alert and cooperative at the time of the examination.  The patient is moderately obese.  Neuromuscular: The patient can only flex about 15 to 20 degrees with the low back.  Skin: No significant peripheral edema is noted.   Neurologic Exam  Mental status: The patient is alert and oriented x 3 at the time of the examination. The patient has apparent normal recent and remote memory, with an apparently normal attention span and concentration ability.   Cranial nerves: Facial symmetry is present. Speech is normal, no aphasia or dysarthria is noted.  Extraocular movements are full. Visual fields are full.  Motor: The patient has good strength in all 4 extremities, but he has a lot of giveaway with the left leg with effort..  Sensory examination: Soft touch sensation is symmetric on the face, arms, and legs.  Coordination: The patient has good finger-nose-finger and heel-to-shin bilaterally.  Gait and station: The patient has a slow, slightly wide-based gait.  Tandem gait is unsteady.  Romberg is negative.  Reflexes: Deep tendon reflexes are symmetric, but are depressed.   Assessment/Plan:  1.  History of low back pain with recent exacerbation  The patient will be given a trial of methocarbamol, we will avoid the steroids as he has had trouble with atrial fibrillation previously.  If the pain does not improve with gentle stretching and with muscle relaxants, we will refer him back again for neuromuscular therapy for the low back.  Otherwise, he will follow up in 6 months.  Jill Alexanders MD 06/11/2019 11:30 AM  [facility] [address] [place], [place] [address]  Phone [phone] Fax [phone]

## 2019-06-17 ENCOUNTER — Ambulatory Visit (INDEPENDENT_AMBULATORY_CARE_PROVIDER_SITE_OTHER): Payer: Medicaid Other | Admitting: *Deleted

## 2019-06-17 DIAGNOSIS — J309 Allergic rhinitis, unspecified: Secondary | ICD-10-CM | POA: Diagnosis not present

## 2019-06-28 ENCOUNTER — Ambulatory Visit (INDEPENDENT_AMBULATORY_CARE_PROVIDER_SITE_OTHER): Payer: Medicaid Other | Admitting: *Deleted

## 2019-06-28 DIAGNOSIS — J309 Allergic rhinitis, unspecified: Secondary | ICD-10-CM | POA: Diagnosis not present

## 2019-07-05 ENCOUNTER — Ambulatory Visit (INDEPENDENT_AMBULATORY_CARE_PROVIDER_SITE_OTHER): Payer: Medicaid Other | Admitting: *Deleted

## 2019-07-05 DIAGNOSIS — J309 Allergic rhinitis, unspecified: Secondary | ICD-10-CM | POA: Diagnosis not present

## 2019-07-09 ENCOUNTER — Encounter: Payer: Self-pay | Admitting: Family Medicine

## 2019-07-09 ENCOUNTER — Other Ambulatory Visit: Payer: Self-pay

## 2019-07-09 ENCOUNTER — Ambulatory Visit: Payer: Medicaid Other | Attending: Family Medicine | Admitting: Family Medicine

## 2019-07-09 DIAGNOSIS — L0292 Furuncle, unspecified: Secondary | ICD-10-CM

## 2019-07-09 DIAGNOSIS — J321 Chronic frontal sinusitis: Secondary | ICD-10-CM

## 2019-07-09 DIAGNOSIS — Z1211 Encounter for screening for malignant neoplasm of colon: Secondary | ICD-10-CM

## 2019-07-09 DIAGNOSIS — L603 Nail dystrophy: Secondary | ICD-10-CM | POA: Diagnosis not present

## 2019-07-09 DIAGNOSIS — L0202 Furuncle of face: Secondary | ICD-10-CM

## 2019-07-09 MED ORDER — PREDNISONE 20 MG PO TABS: 20.0000 mg | ORAL_TABLET | Freq: Every day | ORAL | 0 refills | Status: DC

## 2019-07-09 NOTE — Progress Notes (Signed)
Virtual Visit via Telephone Note  I connected with Jason Stokes, on 07/09/2019 at 10:02 AM by telephone due to the COVID-19 pandemic and verified that I am speaking with the correct person using two identifiers.   Consent: I discussed the limitations, risks, security and privacy concerns of performing an evaluation and management service by telephone and the availability of in person appointments. I also discussed with the patient that there may be a patient responsible charge related to this service. The patient expressed understanding and agreed to proceed.   Location of Patient: Home  Location of Provider: Clinic   Persons participating in Telemedicine visit: Nathinel Heckman Farrington-CMA Dr. Margarita Rana     History of Present Illness: Jason Stokes is a 58 year old male with a history of hypertension, type 2 diabetes mellitus (A1c 5.7), bipolar disorder, paroxysmal A. 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 It drains purulent material sometimes He also has been breaking out on his back; back lesions are itchy and itch occurs at night and he would like to be referred to dermatology for evaluation.  He she has the same bed with his wife who does not itch or have similar symptoms. He would also like a Podiatry referral because his toenails are growing hapharzardly   He complains his sinuses have been flaring up with associated frontal sinus pressure, cough and he receives immunotherapy the last of which was in 07/05/2019.  Denies presence of fever.  Past Medical History:  Diagnosis Date  . Anxiety   . Bilateral carpal tunnel syndrome 01/10/2018  . Bipolar disorder ([place])   . Chronic lower back pain   . Depression   . Dysrhythmia    a-fib  . GERD  (gastroesophageal reflux disease)   . History of alcohol abuse   . History of nuclear stress test    Myoview 10/16: EF 50%, diaphragmatic attenuation, no ischemia, low risk  . Hypertension   . Migraine 2012-2014  . Moderate persistent asthma with acute exacerbation 05/02/2018  . PAF (paroxysmal atrial fibrillation) ([place]) 03/27/2015   a. Myoview neg for ischemia >> Flecainide started 10/16 >> FU ETT   . Schizophrenia ([place])   . Sciatica neuralgia   . Small vessel disease ([place])    Right basal ganglia stroke  . Stroke [facility]) "between 2012-2014"   residual "AF" (09/22/2015)   Allergies  Allergen Reactions  . Lisinopril Anaphylaxis    Swelling of lips and tongue.    Current Outpatient Medications on File Prior to Visit  Medication Sig Dispense Refill  . albuterol (PROAIR HFA) 108 (90 Base) MCG/ACT inhaler Inhale 2 puffs into the lungs every 4 (four) hours as needed for wheezing or shortness of breath. 1 Inhaler 4  . apixaban (ELIQUIS) 5 MG TABS tablet Take 1 tablet (5 mg total) by mouth 2 (two) times daily. 60 tablet 5  . Azelastine HCl 0.15 % SOLN Place 1 spray into both nostrils 2 (two) times daily. 30 mL 5  . carvedilol (COREG) 12.5 MG tablet Take 1 tablet (12.5 mg total) by mouth 2 (two) times daily. 60 tablet 3  . cetirizine (ZYRTEC) 10 MG tablet Take 1 tablet (10 mg total) by mouth daily. 30 tablet 6  . CYMBALTA 30 MG capsule Take by mouth daily.    Marland Kitchen  diltiazem (CARDIZEM CD) 300 MG 24 hr capsule Take 1 capsule (300 mg total) by mouth daily. 90 capsule 1  . dronedarone (MULTAQ) 400 MG tablet Take 1 tablet (400 mg total) by mouth 2 (two) times daily with a meal. 60 tablet 6  . erythromycin ophthalmic ointment Place a 1/2 inch ribbon of ointment into the lower eyelid 3 times a day until symptoms resolve. 1 g 0  . fluticasone (FLONASE) 50 MCG/ACT nasal spray Two sprays in each nostril twice a day for 5 days then reduce to once a day two sprays each nostril 16 g 6  . hydrALAZINE (APRESOLINE)  100 MG tablet Take 1 tablet (100 mg total) by mouth 3 (three) times daily. 270 tablet 3  . methocarbamol (ROBAXIN) 500 MG tablet Take 1 tablet (500 mg total) by mouth 3 (three) times daily. 30 tablet 1  . Olopatadine HCl (PAZEO) 0.7 % SOLN Place 1 drop into both eyes 2 (two) times a day. 1 Bottle 5  . omeprazole (PRILOSEC) 20 MG capsule TAKE 1 CAPSULE BY MOUTH EVERY DAY 90 capsule 1  . Oxycodone HCl 10 MG TABS TAKE 1 (ONE) TABLET FOUR TIMES DAILY, AS NEEDED    . prazosin (MINIPRESS) 1 MG capsule TAKE 1 CAPSULE BY MOUTH EVERYDAY AT BEDTIME  2  . pregabalin (LYRICA) 25 MG capsule 1 capsule PO 4 times a day    . pregabalin (LYRICA) 75 MG capsule Take 1 capsule by mouth 4 (four) times daily as needed.    Marland Kitchen tiZANidine (ZANAFLEX) 4 MG tablet Take 1 tablet (4 mg total) by mouth every 8 (eight) hours as needed for muscle spasms. (Patient not taking: Reported on 07/09/2019) 90 tablet 2   No current facility-administered medications on file prior to visit.    Observations/Objective: Awake, alert, oriented x3 Not in acute distress   Lab Results  Component Value Date   HGBA1C 5.7 04/03/2017    Assessment and Plan: 1. Screening for colon cancer - Ambulatory referral to Gastroenterology  2. Furunculosis - Ambulatory referral to Dermatology  3. Dystrophic nail Referred to podiatry as per request  4. Chronic frontal sinusitis Uncontrolled with acute flare  currently on immunotherapy - predniSONE (DELTASONE) 20 MG tablet; Take 1 tablet (20 mg total) by mouth daily with breakfast.  Dispense: 5 tablet; Refill: 0   Follow Up Instructions: Keep previously scheduled appointment   I discussed the assessment and treatment plan with the patient. The patient was provided an opportunity to ask questions and all were answered. The patient agreed with the plan and demonstrated an understanding of the instructions.   The patient was advised to call back or seek an in-person evaluation if the symptoms  worsen or if the condition fails to improve as anticipated.     I provided 15 minutes total of non-face-to-face time during this encounter including median intraservice time, reviewing previous notes, labs, imaging, medications, management and patient verbalized understanding.     Charlott Rakes, MD, FAAFP. [facility], [place]   07/09/2019, 10:02 AM

## 2019-07-09 NOTE — Progress Notes (Signed)
Patient has been called and DOB has been verified. Patient has been screened and transferred to PCP to start phone visit.   Patient states that he has boils on his face after shaving.  Toenails are turning black.

## 2019-07-16 ENCOUNTER — Ambulatory Visit (HOSPITAL_COMMUNITY)
Admission: EM | Admit: 2019-07-16 | Discharge: 2019-07-16 | Disposition: A | Discharge status: Home / Self Care | Payer: Medicaid Other | Attending: Urgent Care | Admitting: Urgent Care

## 2019-07-16 ENCOUNTER — Other Ambulatory Visit: Payer: Self-pay

## 2019-07-16 DIAGNOSIS — H5789 Other specified disorders of eye and adnexa: Secondary | ICD-10-CM

## 2019-07-16 DIAGNOSIS — L03213 Periorbital cellulitis: Secondary | ICD-10-CM | POA: Diagnosis not present

## 2019-07-16 DIAGNOSIS — H5712 Ocular pain, left eye: Secondary | ICD-10-CM

## 2019-07-16 MED ORDER — CEFDINIR 300 MG PO CAPS: 300.0000 mg | ORAL_CAPSULE | Freq: Two times a day (BID) | ORAL | 0 refills | Status: DC

## 2019-07-16 MED ORDER — ERYTHROMYCIN 5 MG/GM OP OINT: TOPICAL_OINTMENT | OPHTHALMIC | 0 refills | Status: DC

## 2019-07-16 NOTE — Discharge Instructions (Addendum)
[facility] Address: [address], [place], [place] [address] Hours:  Open ? Closes 5PM Products and Services: [email or web address] Appointments: [email or web address] Phone: [phone]

## 2019-07-16 NOTE — ED Provider Notes (Signed)
[place]   MRN: [id number] DOB: August 11, 1960  Subjective:   Jason Stokes is a 58 y.o. male presenting for 1 month history of mild to moderate persistent left eye puffiness.  Over the weekend, patient states it became more red and painful.  States that he woke up this morning and the eye itself is not red.  He has some photosensitivity.  States that a month ago he was seen at his eye doctor and was treated for an eye infection with oral antibiotic and an eyedrop.  Today, he denies any changes in his vision, drainage from his eye.  Denies fever, sinus pain, runny or stuffy nose, ear pain, sore throat, cough.  Patient does not wear contacts.  No current facility-administered medications for this encounter.  Current Outpatient Medications:  .  albuterol (PROAIR HFA) 108 (90 Base) MCG/ACT inhaler, Inhale 2 puffs into the lungs every 4 (four) hours as needed for wheezing or shortness of breath., Disp: 1 Inhaler, Rfl: 4 .  apixaban (ELIQUIS) 5 MG TABS tablet, Take 1 tablet (5 mg total) by mouth 2 (two) times daily., Disp: 60 tablet, Rfl: 5 .  Azelastine HCl 0.15 % SOLN, Place 1 spray into both nostrils 2 (two) times daily., Disp: 30 mL, Rfl: 5 .  carvedilol (COREG) 12.5 MG tablet, Take 1 tablet (12.5 mg total) by mouth 2 (two) times daily., Disp: 60 tablet, Rfl: 3 .  cetirizine (ZYRTEC) 10 MG tablet, Take 1 tablet (10 mg total) by mouth daily., Disp: 30 tablet, Rfl: 6 .  CYMBALTA 30 MG capsule, Take by mouth daily., Disp: , Rfl:  .  diltiazem (CARDIZEM CD) 300 MG 24 hr capsule, Take 1 capsule (300 mg total) by mouth daily., Disp: 90 capsule, Rfl: 1 .  dronedarone (MULTAQ) 400 MG tablet, Take 1 tablet (400 mg total) by mouth 2 (two) times daily with a meal., Disp: 60 tablet, Rfl: 6 .  erythromycin ophthalmic ointment, Place a 1/2 inch ribbon of ointment into the lower eyelid 3 times a day until symptoms resolve., Disp: 1 g, Rfl: 0 .  fluticasone (FLONASE) 50 MCG/ACT nasal spray, Two sprays  in each nostril twice a day for 5 days then reduce to once a day two sprays each nostril, Disp: 16 g, Rfl: 6 .  hydrALAZINE (APRESOLINE) 100 MG tablet, Take 1 tablet (100 mg total) by mouth 3 (three) times daily., Disp: 270 tablet, Rfl: 3 .  methocarbamol (ROBAXIN) 500 MG tablet, Take 1 tablet (500 mg total) by mouth 3 (three) times daily., Disp: 30 tablet, Rfl: 1 .  Olopatadine HCl (PAZEO) 0.7 % SOLN, Place 1 drop into both eyes 2 (two) times a day., Disp: 1 Bottle, Rfl: 5 .  omeprazole (PRILOSEC) 20 MG capsule, TAKE 1 CAPSULE BY MOUTH EVERY DAY, Disp: 90 capsule, Rfl: 1 .  Oxycodone HCl 10 MG TABS, TAKE 1 (ONE) TABLET FOUR TIMES DAILY, AS NEEDED, Disp: , Rfl:  .  prazosin (MINIPRESS) 1 MG capsule, TAKE 1 CAPSULE BY MOUTH EVERYDAY AT BEDTIME, Disp: , Rfl: 2 .  predniSONE (DELTASONE) 20 MG tablet, Take 1 tablet (20 mg total) by mouth daily with breakfast., Disp: 5 tablet, Rfl: 0 .  pregabalin (LYRICA) 25 MG capsule, 1 capsule PO 4 times a day, Disp: , Rfl:  .  pregabalin (LYRICA) 75 MG capsule, Take 1 capsule by mouth 4 (four) times daily as needed., Disp: , Rfl:  .  tiZANidine (ZANAFLEX) 4 MG tablet, Take 1 tablet (4 mg total) by mouth every  8 (eight) hours as needed for muscle spasms. (Patient not taking: Reported on 07/09/2019), Disp: 90 tablet, Rfl: 2   Allergies  Allergen Reactions  . Lisinopril Anaphylaxis    Swelling of lips and tongue.    Past Medical History:  Diagnosis Date  . Anxiety   . Bilateral carpal tunnel syndrome 01/10/2018  . Bipolar disorder ([place])   . Chronic lower back pain   . Depression   . Dysrhythmia    a-fib  . GERD (gastroesophageal reflux disease)   . History of alcohol abuse   . History of nuclear stress test    Myoview 10/16: EF 50%, diaphragmatic attenuation, no ischemia, low risk  . Hypertension   . Migraine 2012-2014  . Moderate persistent asthma with acute exacerbation 05/02/2018  . PAF (paroxysmal atrial fibrillation) ([place]) 03/27/2015   a. Myoview  neg for ischemia >> Flecainide started 10/16 >> FU ETT   . Schizophrenia ([place])   . Sciatica neuralgia   . Small vessel disease ([place])    Right basal ganglia stroke  . Stroke [facility]) "between 2012-2014"   residual "AF" (09/22/2015)     Past Surgical History:  Procedure Laterality Date  . ATRIAL FIBRILLATION ABLATION  09/22/2015  . CYST EXCISION  1996-97   surgery back of head   . ELECTROPHYSIOLOGIC STUDY N/A 09/22/2015   Procedure: Atrial Fibrillation Ablation;  Surgeon: Will [name], MD;  Location: [facility];  Service: Cardiovascular;  Laterality: N/A;  . ELECTROPHYSIOLOGIC STUDY N/A 12/10/2015   Procedure: Atrial Fibrillation Ablation;  Surgeon: Will [name], MD;  Location: [facility];  Service: Cardiovascular;  Laterality: N/A;  . ELECTROPHYSIOLOGIC STUDY N/A 12/11/2015   Procedure: Cardioversion;  Surgeon: Will [name], MD;  Location: [facility];  Service: Cardiovascular;  Laterality: N/A;  . EXCISION MASS HEAD N/A 01/06/2017   Procedure: EXCISION MASS FOREHEAD;  Surgeon: Irene Limbo, MD;  Location: [facility];  Service: Plastics;  Laterality: N/A;  . GANGLION CYST EXCISION Left   . INTERCOSTAL NERVE BLOCK  2005  . KNEE ARTHROSCOPY Right 2016    Family History  Problem Relation Age of Onset  . Heart disease Father   . Schizophrenia Sister     Social History   Tobacco Use  . Smoking status: Former Smoker    Years: 29.00  . Smokeless tobacco: Never Used  Substance Use Topics  . Alcohol use: Yes    Alcohol/week: 0.0 standard drinks    Comment: drinks social  . Drug use: Yes    Types: Marijuana    Comment: smoked marijuana 01-02-17 for pain    ROS   Objective:   Vitals: BP (!) 138/100 (BP Location: Right Arm)   Pulse 78   Temp 97.8 F (36.6 C) (Oral)   Resp 18   Wt 265 lb (120.2 kg)   SpO2 98%   BMI 34.96 kg/m   Physical Exam Constitutional:      General: He is not in acute distress.    Appearance:  Normal appearance. He is well-developed. He is obese. He is not ill-appearing, toxic-appearing or diaphoretic.  HENT:     Head: Normocephalic and atraumatic.     Right Ear: External ear normal.     Left Ear: External ear normal.     Nose: Nose normal.     Mouth/Throat:     Pharynx: Oropharynx is clear.  Eyes:     General: No scleral icterus.       Right eye:  Discharge (white) present. No hordeolum.        Left eye: No discharge or hordeolum.     Extraocular Movements: Extraocular movements intact.     Conjunctiva/sclera:     Right eye: Right conjunctiva is not injected. No chemosis, exudate or hemorrhage.    Left eye: Left conjunctiva is injected. No chemosis, exudate or hemorrhage.    Pupils: Pupils are equal, round, and reactive to light.   Cardiovascular:     Rate and Rhythm: Normal rate.  Pulmonary:     Effort: Pulmonary effort is normal.  Musculoskeletal:     Cervical back: Normal range of motion.  Neurological:     Mental Status: He is alert and oriented to person, place, and time.  Psychiatric:        Mood and Affect: Mood normal.        Behavior: Behavior normal.        Thought Content: Thought content normal.        Judgment: Judgment normal.    Assessment and Plan :   1. Left eye pain   2. Preseptal cellulitis of left eye   3. Red eye     Will have patient start cefdinir with erythromycin ointment for his left eye.  He also has ongoing drainage from his right eye which he is not complaining about today.  However, emphasized need to follow-up as soon as possible with his eye doctor, [facility]. Counseled patient on potential for adverse effects with medications prescribed/recommended today, ER and return-to-clinic precautions discussed, patient verbalized understanding.    Jaynee Eagles, PA-C 07/16/19 1054

## 2019-07-16 NOTE — ED Triage Notes (Signed)
Pt states he has left eye pain this started this morning. Pt states it's sore. Pt states his eye is red and the light hurts his eye.

## 2019-07-17 ENCOUNTER — Ambulatory Visit: Payer: Self-pay | Admitting: *Deleted

## 2019-07-20 ENCOUNTER — Other Ambulatory Visit: Payer: Self-pay | Admitting: Family Medicine

## 2019-07-20 ENCOUNTER — Other Ambulatory Visit: Payer: Self-pay | Admitting: Cardiology

## 2019-07-20 DIAGNOSIS — I480 Paroxysmal atrial fibrillation: Secondary | ICD-10-CM

## 2019-07-22 ENCOUNTER — Other Ambulatory Visit: Payer: Self-pay

## 2019-07-22 ENCOUNTER — Ambulatory Visit: Payer: Medicaid Other | Admitting: Podiatry

## 2019-07-22 DIAGNOSIS — M79676 Pain in unspecified toe(s): Secondary | ICD-10-CM | POA: Diagnosis not present

## 2019-07-22 DIAGNOSIS — B351 Tinea unguium: Secondary | ICD-10-CM

## 2019-07-22 DIAGNOSIS — I480 Paroxysmal atrial fibrillation: Secondary | ICD-10-CM

## 2019-07-22 MED ORDER — APIXABAN 5 MG PO TABS: 5.0000 mg | ORAL_TABLET | Freq: Two times a day (BID) | ORAL | 5 refills | Status: DC

## 2019-07-22 NOTE — Telephone Encounter (Signed)
Eliquis 5mg  refill request received, pt is 58 yrs old, weight-120.2kg, Crea-1.32 on 08/14/2018, Diagnosis-Afib, and last seen by Dr. Curt Bears on 04/09/2019. Dose is appropriate based on dosing criteria. Will send in refill to requested pharmacy.

## 2019-07-25 NOTE — Progress Notes (Signed)
   SUBJECTIVE Patient presents to office today complaining of elongated, thickened nails that cause pain while ambulating in shoes. He is unable to trim his own nails. Patient is here for further evaluation and treatment.  Past Medical History:  Diagnosis Date  . Anxiety   . Bilateral carpal tunnel syndrome 01/10/2018  . Bipolar disorder ([place])   . Chronic lower back pain   . Depression   . Dysrhythmia    a-fib  . GERD (gastroesophageal reflux disease)   . History of alcohol abuse   . History of nuclear stress test    Myoview 10/16: EF 50%, diaphragmatic attenuation, no ischemia, low risk  . Hypertension   . Migraine 2012-2014  . Moderate persistent asthma with acute exacerbation 05/02/2018  . PAF (paroxysmal atrial fibrillation) ([place]) 03/27/2015   a. Myoview neg for ischemia >> Flecainide started 10/16 >> FU ETT   . Schizophrenia ([place])   . Sciatica neuralgia   . Small vessel disease ([place])    Right basal ganglia stroke  . Stroke [facility] "between 2012-2014"   residual "AF" (09/22/2015)    OBJECTIVE General Patient is awake, alert, and oriented x 3 and in no acute distress. Derm Skin is dry and supple bilateral. Negative open lesions or macerations. Remaining integument unremarkable. Nails are tender, long, thickened and dystrophic with subungual debris, consistent with onychomycosis, 1-5 bilateral. No signs of infection noted. Vasc  DP and PT pedal pulses palpable bilaterally. Temperature gradient within normal limits.  Neuro Epicritic and protective threshold sensation grossly intact bilaterally.  Musculoskeletal Exam No symptomatic pedal deformities noted bilateral. Muscular strength within normal limits.  ASSESSMENT 1. Onychodystrophic nails 1-5 bilateral with hyperkeratosis of nails.  2. Onychomycosis of nail due to dermatophyte bilateral 3. Pain in foot bilateral  PLAN OF CARE 1. Patient evaluated today.  2. Instructed to maintain good pedal hygiene and foot care.  3.  Mechanical debridement of nails 1-5 bilaterally performed using a nail nipper. Filed with dremel without incident.  4. Return to clinic in 3 mos.    Edrick Kins, DPM [facility]  Dr. Edrick Kins, [address]                                        [place], [place] [address]                Office [phone]  Fax [phone]

## 2019-07-26 ENCOUNTER — Ambulatory Visit (INDEPENDENT_AMBULATORY_CARE_PROVIDER_SITE_OTHER): Payer: Medicaid Other

## 2019-07-26 DIAGNOSIS — J309 Allergic rhinitis, unspecified: Secondary | ICD-10-CM

## 2019-08-08 ENCOUNTER — Encounter (HOSPITAL_COMMUNITY): Payer: Self-pay

## 2019-08-08 ENCOUNTER — Other Ambulatory Visit: Payer: Self-pay

## 2019-08-08 ENCOUNTER — Emergency Department (HOSPITAL_COMMUNITY)
Admission: EM | Admit: 2019-08-08 | Discharge: 2019-08-08 | Disposition: A | Discharge status: Home / Self Care | Payer: Medicaid Other | Attending: Emergency Medicine | Admitting: Emergency Medicine

## 2019-08-08 ENCOUNTER — Emergency Department (HOSPITAL_COMMUNITY): Payer: Medicaid Other

## 2019-08-08 DIAGNOSIS — S0512XD Contusion of eyeball and orbital tissues, left eye, subsequent encounter: Secondary | ICD-10-CM

## 2019-08-08 DIAGNOSIS — J45909 Unspecified asthma, uncomplicated: Secondary | ICD-10-CM | POA: Diagnosis not present

## 2019-08-08 DIAGNOSIS — Z87891 Personal history of nicotine dependence: Secondary | ICD-10-CM | POA: Insufficient documentation

## 2019-08-08 DIAGNOSIS — I10 Essential (primary) hypertension: Secondary | ICD-10-CM | POA: Diagnosis not present

## 2019-08-08 DIAGNOSIS — Y929 Unspecified place or not applicable: Secondary | ICD-10-CM | POA: Insufficient documentation

## 2019-08-08 DIAGNOSIS — Y999 Unspecified external cause status: Secondary | ICD-10-CM | POA: Insufficient documentation

## 2019-08-08 DIAGNOSIS — Y939 Activity, unspecified: Secondary | ICD-10-CM | POA: Insufficient documentation

## 2019-08-08 DIAGNOSIS — S0592XA Unspecified injury of left eye and orbit, initial encounter: Secondary | ICD-10-CM | POA: Diagnosis present

## 2019-08-08 DIAGNOSIS — Z79899 Other long term (current) drug therapy: Secondary | ICD-10-CM | POA: Diagnosis not present

## 2019-08-08 DIAGNOSIS — S0232XA Fracture of orbital floor, left side, initial encounter for closed fracture: Secondary | ICD-10-CM | POA: Diagnosis not present

## 2019-08-08 LAB — BASIC METABOLIC PANEL
Anion gap: 10 (ref 5–15)
BUN: 15 mg/dL (ref 6–20)
CO2: 24 mmol/L (ref 22–32)
Calcium: 9.3 mg/dL (ref 8.9–10.3)
Chloride: 104 mmol/L (ref 98–111)
Creatinine, Ser: 1.49 mg/dL — ABNORMAL HIGH (ref 0.61–1.24)
GFR calc Af Amer: 59 mL/min — ABNORMAL LOW (ref >60)
GFR calc non Af Amer: 51 mL/min — ABNORMAL LOW (ref >60)
Glucose, Bld: 95 mg/dL (ref 70–99)
Potassium: 4.1 mmol/L (ref 3.5–5.1)
Sodium: 138 mmol/L (ref 135–145)

## 2019-08-08 LAB — CBC WITH DIFFERENTIAL/PLATELET
Abs Immature Granulocytes: 0.03 10*3/uL (ref 0.00–0.07)
Basophils Absolute: 0 10*3/uL (ref 0.0–0.1)
Basophils Relative: 1 %
Eosinophils Absolute: 0.2 10*3/uL (ref 0.0–0.5)
Eosinophils Relative: 3 %
HCT: 46.7 % (ref 39.0–52.0)
Hemoglobin: 14.7 g/dL (ref 13.0–17.0)
Immature Granulocytes: 1 %
Lymphocytes Relative: 28 %
Lymphs Abs: 1.6 10*3/uL (ref 0.7–4.0)
MCH: 24.9 pg — ABNORMAL LOW (ref 26.0–34.0)
MCHC: 31.5 g/dL (ref 30.0–36.0)
MCV: 79.2 fL — ABNORMAL LOW (ref 80.0–100.0)
Monocytes Absolute: 0.7 10*3/uL (ref 0.1–1.0)
Monocytes Relative: 12 %
Neutro Abs: 3.3 10*3/uL (ref 1.7–7.7)
Neutrophils Relative %: 55 %
Platelets: 224 10*3/uL (ref 150–400)
RBC: 5.9 MIL/uL — ABNORMAL HIGH (ref 4.22–5.81)
RDW: 16.4 % — ABNORMAL HIGH (ref 11.5–15.5)
WBC: 5.8 10*3/uL (ref 4.0–10.5)
nRBC: 0 % (ref 0.0–0.2)

## 2019-08-08 MED ORDER — SODIUM CHLORIDE (PF) 0.9 % IJ SOLN
INTRAMUSCULAR | Status: AC
  Filled 2019-08-08: qty 50

## 2019-08-08 MED ORDER — IOHEXOL 300 MG/ML  SOLN
75.0000 mL | Freq: Once | INTRAMUSCULAR | Status: AC | PRN
  Administered 2019-08-08: 75 mL via INTRAVENOUS

## 2019-08-08 NOTE — ED Notes (Signed)
An After Visit Summary was printed and given to the patient. Discharge instructions given and no further questions at this time.  

## 2019-08-08 NOTE — ED Triage Notes (Signed)
Pt c/o left eye pain, states that his PCP told him to come to [facility] for MRI.
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
## 2019-08-09 ENCOUNTER — Ambulatory Visit (INDEPENDENT_AMBULATORY_CARE_PROVIDER_SITE_OTHER): Payer: Medicaid Other | Admitting: *Deleted

## 2019-08-09 DIAGNOSIS — J309 Allergic rhinitis, unspecified: Secondary | ICD-10-CM | POA: Diagnosis not present

## 2019-08-16 ENCOUNTER — Ambulatory Visit (INDEPENDENT_AMBULATORY_CARE_PROVIDER_SITE_OTHER): Payer: Medicaid Other | Admitting: *Deleted

## 2019-08-16 DIAGNOSIS — J309 Allergic rhinitis, unspecified: Secondary | ICD-10-CM | POA: Diagnosis not present

## 2019-08-18 ENCOUNTER — Other Ambulatory Visit: Payer: Self-pay | Admitting: Family Medicine

## 2019-08-18 DIAGNOSIS — I10 Essential (primary) hypertension: Secondary | ICD-10-CM

## 2019-09-06 ENCOUNTER — Ambulatory Visit (INDEPENDENT_AMBULATORY_CARE_PROVIDER_SITE_OTHER): Payer: Medicaid Other | Admitting: *Deleted

## 2019-09-06 DIAGNOSIS — J309 Allergic rhinitis, unspecified: Secondary | ICD-10-CM

## 2019-09-20 ENCOUNTER — Ambulatory Visit (INDEPENDENT_AMBULATORY_CARE_PROVIDER_SITE_OTHER): Payer: Medicaid Other | Admitting: *Deleted

## 2019-09-20 DIAGNOSIS — J309 Allergic rhinitis, unspecified: Secondary | ICD-10-CM

## 2019-09-26 ENCOUNTER — Other Ambulatory Visit: Payer: Self-pay | Admitting: Critical Care Medicine

## 2019-09-30 ENCOUNTER — Ambulatory Visit (INDEPENDENT_AMBULATORY_CARE_PROVIDER_SITE_OTHER): Payer: Medicaid Other

## 2019-09-30 DIAGNOSIS — J309 Allergic rhinitis, unspecified: Secondary | ICD-10-CM | POA: Diagnosis not present

## 2019-10-01 ENCOUNTER — Other Ambulatory Visit: Payer: Self-pay | Admitting: Cardiology

## 2019-10-01 NOTE — Progress Notes (Signed)
VIALS EXP 09-30-20

## 2019-10-02 DIAGNOSIS — J301 Allergic rhinitis due to pollen: Secondary | ICD-10-CM | POA: Diagnosis not present

## 2019-10-11 ENCOUNTER — Observation Stay (HOSPITAL_COMMUNITY)
Admission: AD | Admit: 2019-10-11 | Discharge: 2019-10-11 | Disposition: A | Discharge status: Home / Self Care | Payer: Medicaid Other | Attending: Psychiatry | Admitting: Psychiatry

## 2019-10-11 ENCOUNTER — Ambulatory Visit (INDEPENDENT_AMBULATORY_CARE_PROVIDER_SITE_OTHER): Payer: Medicaid Other | Admitting: *Deleted

## 2019-10-11 DIAGNOSIS — F209 Schizophrenia, unspecified: Secondary | ICD-10-CM | POA: Diagnosis not present

## 2019-10-11 DIAGNOSIS — G43909 Migraine, unspecified, not intractable, without status migrainosus: Secondary | ICD-10-CM | POA: Insufficient documentation

## 2019-10-11 DIAGNOSIS — Z8673 Personal history of transient ischemic attack (TIA), and cerebral infarction without residual deficits: Secondary | ICD-10-CM | POA: Diagnosis not present

## 2019-10-11 DIAGNOSIS — I10 Essential (primary) hypertension: Secondary | ICD-10-CM | POA: Insufficient documentation

## 2019-10-11 DIAGNOSIS — J309 Allergic rhinitis, unspecified: Secondary | ICD-10-CM | POA: Diagnosis not present

## 2019-10-11 DIAGNOSIS — J4540 Moderate persistent asthma, uncomplicated: Secondary | ICD-10-CM | POA: Diagnosis not present

## 2019-10-11 DIAGNOSIS — Z8249 Family history of ischemic heart disease and other diseases of the circulatory system: Secondary | ICD-10-CM | POA: Diagnosis not present

## 2019-10-11 DIAGNOSIS — K219 Gastro-esophageal reflux disease without esophagitis: Secondary | ICD-10-CM | POA: Insufficient documentation

## 2019-10-11 DIAGNOSIS — I4891 Unspecified atrial fibrillation: Secondary | ICD-10-CM | POA: Insufficient documentation

## 2019-10-11 DIAGNOSIS — F419 Anxiety disorder, unspecified: Principal | ICD-10-CM | POA: Insufficient documentation

## 2019-10-11 DIAGNOSIS — F319 Bipolar disorder, unspecified: Secondary | ICD-10-CM | POA: Diagnosis not present

## 2019-10-11 DIAGNOSIS — Z888 Allergy status to other drugs, medicaments and biological substances status: Secondary | ICD-10-CM | POA: Diagnosis not present

## 2019-10-11 DIAGNOSIS — M5440 Lumbago with sciatica, unspecified side: Secondary | ICD-10-CM | POA: Diagnosis not present

## 2019-10-11 DIAGNOSIS — Z818 Family history of other mental and behavioral disorders: Secondary | ICD-10-CM | POA: Insufficient documentation

## 2019-10-11 NOTE — Progress Notes (Signed)
TTS counselor rescinded the IVC.
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
## 2019-10-11 NOTE — Progress Notes (Signed)
Pt under IVC, cleared for discharge home.  IVC rescinded by NP Lindon Romp.  Pt left ED A&O x 4, no distress noted, calm & cooperative, gait steady.

## 2019-10-21 ENCOUNTER — Ambulatory Visit: Payer: Medicaid Other | Admitting: Podiatry

## 2019-10-25 ENCOUNTER — Other Ambulatory Visit: Payer: Self-pay | Admitting: *Deleted

## 2019-10-25 ENCOUNTER — Ambulatory Visit (INDEPENDENT_AMBULATORY_CARE_PROVIDER_SITE_OTHER): Payer: Medicaid Other

## 2019-10-25 DIAGNOSIS — J309 Allergic rhinitis, unspecified: Secondary | ICD-10-CM | POA: Diagnosis not present

## 2019-10-25 MED ORDER — EPINEPHRINE 0.3 MG/0.3ML IJ SOAJ: 0.3000 mg | Freq: Once | INTRAMUSCULAR | 1 refills | Status: AC

## 2019-10-29 ENCOUNTER — Other Ambulatory Visit: Payer: Self-pay

## 2019-10-29 ENCOUNTER — Encounter: Payer: Self-pay | Admitting: Allergy and Immunology

## 2019-10-29 ENCOUNTER — Ambulatory Visit: Payer: Medicaid Other | Admitting: Family Medicine

## 2019-10-29 ENCOUNTER — Ambulatory Visit: Payer: Medicaid Other | Admitting: Allergy and Immunology

## 2019-10-29 VITALS — BP 140/86 | HR 76 | Temp 97.4°F | Resp 18

## 2019-10-29 DIAGNOSIS — J321 Chronic frontal sinusitis: Secondary | ICD-10-CM

## 2019-10-29 DIAGNOSIS — J3089 Other allergic rhinitis: Secondary | ICD-10-CM

## 2019-10-29 DIAGNOSIS — H1045 Other chronic allergic conjunctivitis: Secondary | ICD-10-CM | POA: Diagnosis not present

## 2019-10-29 DIAGNOSIS — F17200 Nicotine dependence, unspecified, uncomplicated: Secondary | ICD-10-CM

## 2019-10-29 DIAGNOSIS — J302 Other seasonal allergic rhinitis: Secondary | ICD-10-CM

## 2019-10-29 DIAGNOSIS — J4540 Moderate persistent asthma, uncomplicated: Secondary | ICD-10-CM | POA: Diagnosis not present

## 2019-10-29 DIAGNOSIS — J301 Allergic rhinitis due to pollen: Secondary | ICD-10-CM

## 2019-10-29 DIAGNOSIS — J0140 Acute pansinusitis, unspecified: Secondary | ICD-10-CM

## 2019-10-29 DIAGNOSIS — H1013 Acute atopic conjunctivitis, bilateral: Secondary | ICD-10-CM

## 2019-10-29 MED ORDER — OLOPATADINE HCL 0.2 % OP SOLN: OPHTHALMIC | 5 refills | Status: DC

## 2019-10-29 MED ORDER — BREO ELLIPTA 200-25 MCG/INH IN AEPB: 1.0000 | INHALATION_SPRAY | Freq: Every day | RESPIRATORY_TRACT | 2 refills | Status: DC

## 2019-10-29 MED ORDER — AMOXICILLIN-POT CLAVULANATE 875-125 MG PO TABS: ORAL_TABLET | ORAL | 0 refills | Status: DC

## 2019-10-29 MED ORDER — FLUTICASONE PROPIONATE 50 MCG/ACT NA SUSP: 1.0000 | Freq: Two times a day (BID) | NASAL | 5 refills | Status: DC

## 2019-10-29 MED ORDER — PREDNISONE 10 MG PO TABS: ORAL_TABLET | ORAL | 0 refills | Status: DC

## 2019-10-29 MED ORDER — AZELASTINE HCL 0.15 % NA SOLN: 1.0000 | Freq: Two times a day (BID) | NASAL | 5 refills | Status: DC

## 2019-10-29 MED ORDER — CETIRIZINE HCL 10 MG PO TABS: ORAL_TABLET | ORAL | 5 refills | Status: DC

## 2019-10-29 MED ORDER — ALBUTEROL SULFATE HFA 108 (90 BASE) MCG/ACT IN AERS: 2.0000 | INHALATION_SPRAY | RESPIRATORY_TRACT | 2 refills | Status: DC | PRN

## 2019-10-29 MED ORDER — MONTELUKAST SODIUM 10 MG PO TABS: 10.0000 mg | ORAL_TABLET | Freq: Every day | ORAL | 5 refills | Status: DC

## 2019-10-29 NOTE — Patient Instructions (Addendum)
  1.  Perform Allergen avoidance measures as best as possible.  2.  Move away from mold infested house secondary to health condition  3.  Use nicotine substitutes to replace tobacco smoke exposure  4.  Treat and prevent inflammation:   A.  Flonase 1 spray each nostril twice a day  B.  Azelastine nasal spray 1 spray each nostril twice a day  C.  Montelukast 10 mg 1 tablet 1 time per day  D.  Breo 200 - 1 inhalation 1 time per day  E.  Prednisone 10 mg - 1 tablet 1 time per day for 10 days only  5. Treat infection:   A. Augmentin 875 - 1 tablet 1 time per day for 10 days only  6.  If needed:   A.  Cetirizine 10 mg 1 tablet 1-2 times a day  B.  Pataday - 1 drop each eye 1 time per day  C.  Pro-air HFA 2 puffs every 4-6 hours  7. Continue immunotherapy  8. Return to clinic in 6 months or earlier if problem  9.  Plan for Covid vaccine

## 2019-10-29 NOTE — Progress Notes (Signed)
[facility] [place] [facility] [place]   Follow-up Note  Referring Provider: Charlott Rakes, MD Primary Provider: Charlott Rakes, MD Date of Office Visit: 10/29/2019  Subjective:   Jason Stokes (DOB: 02-05-1961) is a 59 y.o. male who returns to the [facility] on 10/29/2019 in re-evaluation of the following:  HPI: Kirby returns to this clinic in evaluation of his allergic rhinoconjunctivitis and asthma treated with immunotherapy and a history of tobacco use.  His last visit to this clinic was 03 April 2018.  Over the course of the past 3 to 4 weeks, since the spring pollen has arrived, he has had significant problems with sneezing and nasal congestion and itchy eyes.  Initially the material emanating from his nose was clear but now it has turned yellow.  He has been having coughing and wheezing and has been using a bronchodilator about 4 times per week.  This occurs even though he remains on Flonase and azelastine nasal spray.    He is using a steroid eyedrop prescribed by his ophthalmologist as he had a traumatic blowout fracture of his left orbit in January 2021.  He continues to smoke a few cigarettes per day.  He continues on immunotherapy currently at every week or so without any adverse effect.  He did obtain the flu vaccine last year but did not obtain the Covid vaccine yet.  He still lives in his mold infested household.  Allergies as of 10/29/2019      Reactions   Lisinopril Anaphylaxis   Swelling of lips and tongue.      Medication List    albuterol 108 (90 Base) MCG/ACT inhaler Commonly known as: ProAir HFA Inhale 2 puffs into the lungs every 4 (four) hours as needed for wheezing or shortness of breath.   apixaban 5 MG Tabs tablet Commonly known as: Eliquis Take 1 tablet (5 mg total) by mouth 2 (two) times daily.   Azelastine HCl 0.15 % Soln Place 1 spray into both nostrils 2 (two) times daily.   carvedilol 12.5 MG  tablet Commonly known as: COREG TAKE 1 TABLET (12.5 MG TOTAL) BY MOUTH 2 (TWO) TIMES DAILY.   cetirizine 10 MG tablet Commonly known as: ZYRTEC Take 1 tablet (10 mg total) by mouth daily.   Cymbalta 30 MG capsule Generic drug: DULoxetine Take by mouth daily.   diltiazem 300 MG 24 hr capsule Commonly known as: CARDIZEM CD TAKE 1 CAPSULE BY MOUTH EVERY DAY   dronedarone 400 MG tablet Commonly known as: MULTAQ Take 1 tablet (400 mg total) by mouth 2 (two) times daily with a meal.   fluticasone 50 MCG/ACT nasal spray Commonly known as: FLONASE Two sprays in each nostril twice a day for 5 days then reduce to once a day two sprays each nostril   hydrALAZINE 100 MG tablet Commonly known as: APRESOLINE TAKE 1 TABLET (100 MG TOTAL) BY MOUTH 3 (THREE) TIMES DAILY.   omeprazole 20 MG capsule Commonly known as: PRILOSEC TAKE 1 CAPSULE BY MOUTH EVERY DAY   Oxycodone HCl 10 MG Tabs TAKE 1 (ONE) TABLET FOUR TIMES DAILY, AS NEEDED   Pazeo 0.7 % Soln Generic drug: Olopatadine HCl Place 1 drop into both eyes 2 (two) times a day.   prazosin 1 MG capsule Commonly known as: MINIPRESS TAKE 1 CAPSULE BY MOUTH EVERYDAY AT BEDTIME   pregabalin 75 MG capsule Commonly known as: LYRICA Take 1 capsule by mouth 4 (four) times daily as needed.  Past Medical History:  Diagnosis Date  . Anxiety   . Bilateral carpal tunnel syndrome 01/10/2018  . Bipolar disorder ([place])   . Chronic lower back pain   . Depression   . Dysrhythmia    a-fib  . GERD (gastroesophageal reflux disease)   . History of alcohol abuse   . History of nuclear stress test    Myoview 10/16: EF 50%, diaphragmatic attenuation, no ischemia, low risk  . Hypertension   . Migraine 2012-2014  . Moderate persistent asthma with acute exacerbation 05/02/2018  . PAF (paroxysmal atrial fibrillation) ([place]) 03/27/2015   a. Myoview neg for ischemia >> Flecainide started 10/16 >> FU ETT   . Schizophrenia ([place])   . Sciatica  neuralgia   . Small vessel disease ([place])    Right basal ganglia stroke  . Stroke [facility]) "between 2012-2014"   residual "AF" (09/22/2015)    Past Surgical History:  Procedure Laterality Date  . ATRIAL FIBRILLATION ABLATION  09/22/2015  . CYST EXCISION  1996-97   surgery back of head   . ELECTROPHYSIOLOGIC STUDY N/A 09/22/2015   Procedure: Atrial Fibrillation Ablation;  Surgeon: Will [name], MD;  Location: [facility];  Service: Cardiovascular;  Laterality: N/A;  . ELECTROPHYSIOLOGIC STUDY N/A 12/10/2015   Procedure: Atrial Fibrillation Ablation;  Surgeon: Will [name], MD;  Location: [facility];  Service: Cardiovascular;  Laterality: N/A;  . ELECTROPHYSIOLOGIC STUDY N/A 12/11/2015   Procedure: Cardioversion;  Surgeon: Will [name], MD;  Location: [facility];  Service: Cardiovascular;  Laterality: N/A;  . EXCISION MASS HEAD N/A 01/06/2017   Procedure: EXCISION MASS FOREHEAD;  Surgeon: Irene Limbo, MD;  Location: [facility];  Service: Plastics;  Laterality: N/A;  . GANGLION CYST EXCISION Left   . INTERCOSTAL NERVE BLOCK  2005  . KNEE ARTHROSCOPY Right 2016    Review of systems negative except as noted in HPI / PMHx or noted below:  Review of Systems  Constitutional: Negative.   HENT: Negative.   Eyes: Negative.   Respiratory: Negative.   Cardiovascular: Negative.   Gastrointestinal: Negative.   Genitourinary: Negative.   Musculoskeletal: Negative.   Skin: Negative.   Neurological: Negative.   Endo/Heme/Allergies: Negative.   Psychiatric/Behavioral: Negative.      Objective:   Vitals:   10/29/19 0914  BP: 140/86  Pulse: 76  Resp: 18  Temp: (!) 97.4 F (36.3 C)  SpO2: 96%          Physical Exam Constitutional:      Appearance: He is not diaphoretic.     Comments: Nasal voice   HENT:     Head: Normocephalic.     Right Ear: Tympanic membrane, ear canal and external ear normal.     Left Ear: Tympanic  membrane, ear canal and external ear normal.     Nose: Mucosal edema present. No rhinorrhea.     Mouth/Throat:     Pharynx: Uvula midline. No oropharyngeal exudate.  Eyes:     Conjunctiva/sclera: Conjunctivae normal.  Neck:     Thyroid: No thyromegaly.     Trachea: Trachea normal. No tracheal tenderness or tracheal deviation.  Cardiovascular:     Rate and Rhythm: Normal rate and regular rhythm.     Heart sounds: Normal heart sounds, S1 normal and S2 normal. No murmur.  Pulmonary:     Effort: No respiratory distress.     Breath sounds: Normal breath sounds. No stridor. No wheezing or rales.  Lymphadenopathy:  Head:     Right side of head: No tonsillar adenopathy.     Left side of head: No tonsillar adenopathy.     Cervical: No cervical adenopathy.  Skin:    Findings: No erythema or rash.     Nails: There is no clubbing.  Neurological:     Mental Status: He is alert.     Diagnostics:    Spirometry was performed and demonstrated an FEV1 of 3.23 at 100% % of predicted.  The patient had an Asthma Control Test with the following results: ACT Total Score: 17.    Results of a CT scan orbit obtained 08 August 2019 identifies the following:  Orbits: Right orbit is normal. Globe is normal. No traumatic finding. Intraorbital contents are normal. Surrounding bone structures are normal.  Left orbit shows extension of a medial wall blowout fracture. There is an old medial wall blowout fracture visible on the study of 09/22/2015, but there is acute extension, with the area of involvement now measuring up to 1.7 cm of the circumference. There is bulging the medial orbital fat. No gross medial rectus trapping, but correlation with extraocular motion is recommended. 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 No postseptal hematoma. No evidence of globe injury.  Visualized sinuses: Mild mucosal inflammatory changes elsewhere affecting the paranasal  sinuses.  Results of blood tests obtained 08 August 2019 identifies WBC 5.8, absolute eosinophil 200, absolute lymphocyte 1600, hemoglobin 14.7, platelet 224.  Assessment and Plan:   1. Not well controlled moderate persistent asthma   2. Perennial allergic rhinitis   3. Seasonal allergic rhinitis due to pollen   4. Perennial allergic conjunctivitis of both eyes   5. Light tobacco smoker   6. Acute non-recurrent pansinusitis   7. Chronic frontal sinusitis   8. Seasonal allergies     1.  Perform Allergen avoidance measures as best as possible.  2.  Move away from mold infested house  3.  Use nicotine substitutes to replace tobacco smoke exposure  4.  Treat and prevent inflammation:   A.  Flonase 1 spray each nostril twice a day  B.  Azelastine nasal spray 1 spray each nostril twice a day  C.  Montelukast 10 mg 1 tablet 1 time per day  D.  Breo 200 - 1 inhalation 1 time per day  E.  Prednisone 10 mg - 1 tablet 1 time per day for 10 days only  5. Treat infection:   A. Augmentin 875 - 1 tablet 1 time per day for 10 days only  6.  If needed:   A.  Cetirizine 10 mg 1 tablet 1-2 times a day  B.  Pataday - 1 drop each eye 1 time per day  C.  Pro-air HFA 2 puffs every 4-6 hours  7. Continue immunotherapy  8. Return to clinic in 6 months or earlier if problem  9.  Plan for Covid vaccine  Dannon appears to have be having an exacerbation of his multiorgan atopic disease complicated by a episode of sinusitis for which we will treat him with a combination of anti-inflammatory agents for his airway and a broad-spectrum antibiotic as noted above.  He will also continue on immunotherapy.  Assuming he does well with this plan I will see him back in this clinic in 6 months or earlier if there is a problem.  Allena Katz, MD Allergy / Immunology [place]

## 2019-10-30 ENCOUNTER — Encounter: Payer: Self-pay | Admitting: Allergy and Immunology

## 2019-10-30 ENCOUNTER — Other Ambulatory Visit: Payer: Self-pay | Admitting: *Deleted

## 2019-10-30 ENCOUNTER — Telehealth: Payer: Self-pay | Admitting: *Deleted

## 2019-10-30 NOTE — Telephone Encounter (Signed)
PA for Memory Dance has been submitted through [facility] and is currently suspended/ pending. PA for Azelastine has been approved through [employer], form has been faxed to pharmacy and placed in bulk scanning.

## 2019-10-31 MED ORDER — FLUTICASONE-SALMETEROL 500-50 MCG/DOSE IN AEPB: 1.0000 | INHALATION_SPRAY | Freq: Every day | RESPIRATORY_TRACT | 5 refills | Status: DC

## 2019-10-31 NOTE — Telephone Encounter (Signed)
New prescription has been sent in. Called patient and advised through detailed voicemail per [facility] permission.

## 2019-10-31 NOTE — Telephone Encounter (Signed)
PA for Breo 200 was denied stating that the patient must try and fail Dulera, Symbicort, and/or Advair Diskus. Please advise change in medication. Thank You.

## 2019-10-31 NOTE — Telephone Encounter (Signed)
Please inform patient that we need to give him Advair 500 Diskus-1 inhalation once a day to replace his Memory Dance that his insurance company will not pay for.

## 2019-10-31 NOTE — Addendum Note (Signed)
Addended by: Chip Boer R on: 10/31/2019 04:07 PM   Modules accepted: Orders

## 2019-11-04 MED ORDER — AZELASTINE HCL 0.1 % NA SOLN: 1.0000 | Freq: Two times a day (BID) | NASAL | 5 refills | Status: DC

## 2019-11-04 NOTE — Addendum Note (Signed)
Addended by: Neomia Dear on: 11/04/2019 05:16 PM   Modules accepted: Orders

## 2019-11-08 ENCOUNTER — Ambulatory Visit (INDEPENDENT_AMBULATORY_CARE_PROVIDER_SITE_OTHER): Payer: Medicaid Other

## 2019-11-08 DIAGNOSIS — J309 Allergic rhinitis, unspecified: Secondary | ICD-10-CM

## 2019-11-15 ENCOUNTER — Telehealth: Payer: Self-pay | Admitting: Allergy and Immunology

## 2019-11-15 NOTE — Telephone Encounter (Signed)
Patient called and states he needs a prior authorization for his Epi Pen. Patient says there is a lot of bees around his house and he does not want to get stung and not have his Epi Pen.  Uses [facility] on [address].  Please advise.

## 2019-11-15 NOTE — Telephone Encounter (Signed)
[facility] and spoke with a pharmacy technician and got it straightened out so that it is not an issue for the patient to go pick up his EpiPen. Called patient and advised. Patient verbalized understanding.

## 2019-11-19 ENCOUNTER — Encounter: Payer: Self-pay | Admitting: Cardiology

## 2019-11-19 ENCOUNTER — Other Ambulatory Visit: Payer: Self-pay

## 2019-11-19 ENCOUNTER — Ambulatory Visit (INDEPENDENT_AMBULATORY_CARE_PROVIDER_SITE_OTHER): Payer: Medicaid Other | Admitting: Cardiology

## 2019-11-19 VITALS — BP 130/78 | HR 85 | Ht 73.0 in | Wt 254.0 lb

## 2019-11-19 DIAGNOSIS — I480 Paroxysmal atrial fibrillation: Secondary | ICD-10-CM

## 2019-11-19 NOTE — Progress Notes (Signed)
Electrophysiology Office Note   Date:  11/19/2019   ID:  Jason Stokes, DOB 05/02/1961, MRN [id number]  PCP:  Charlott Rakes, MD  Cardiologist:  [name] Primary Electrophysiologist: Elliet Goodnow [name], MD    No chief complaint on file.    History of Present Illness: Jason Stokes is a 59 y.o. male who presents today for electrophysiology evaluation.   He is a history of paroxysmal atrial fibrillation, hypertension and remote CVA. He presented to the emergency room with atrial fibrillation and RVR. He was started on Eliquis and diltiazem at that time. He did not follow-up with cardiology until 03/27/15 when he complained of palpitations. He had AF ablation performed on 09/22/15. Unfortunately during his procedure, it was noted that he had a clot in his left atrium and the procedure was therefore canceled. Had repeat ablation 12/10/15. He had a stress test performed after initiation of flecainide. He had ventricular ectopy with nonsustained VT.  Today, denies symptoms of palpitations, chest pain, shortness of breath, orthopnea, PND, lower extremity edema, claudication, dizziness, presyncope, syncope, bleeding, or neurologic sequela. The patient is tolerating medications without difficulties.  Overall he is doing well.  He has no chest pain or shortness of breath.  He is noted 3 episodes of atrial fibrillation all of which have lasted less than 10 minutes.  Aside from that, he has no major complaints.  He is planning to go to [place] later this week.   Past Medical History:  Diagnosis Date  . Anxiety   . Bilateral carpal tunnel syndrome 01/10/2018  . Bipolar disorder ([place])   . Chronic lower back pain   . Depression   . Dysrhythmia    a-fib  . GERD (gastroesophageal reflux disease)   . History of alcohol abuse   . History of nuclear stress test    Myoview 10/16: EF 50%, diaphragmatic attenuation, no ischemia, low risk  . Hypertension   . Migraine 2012-2014  . Moderate persistent  asthma with acute exacerbation 05/02/2018  . PAF (paroxysmal atrial fibrillation) ([place]) 03/27/2015   a. Myoview neg for ischemia >> Flecainide started 10/16 >> FU ETT   . Schizophrenia ([place])   . Sciatica neuralgia   . Small vessel disease ([employer])    Right basal ganglia stroke  . Stroke [facility]) "between 2012-2014"   residual "AF" (09/22/2015)   Past Surgical History:  Procedure Laterality Date  . ATRIAL FIBRILLATION ABLATION  09/22/2015  . CYST EXCISION  1996-97   surgery back of head   . ELECTROPHYSIOLOGIC STUDY N/A 09/22/2015   Procedure: Atrial Fibrillation Ablation;  Surgeon: Hagan Vanauken [name], MD;  Location: [facility];  Service: Cardiovascular;  Laterality: N/A;  . ELECTROPHYSIOLOGIC STUDY N/A 12/10/2015   Procedure: Atrial Fibrillation Ablation;  Surgeon: Shamarcus Hoheisel [name], MD;  Location: [facility];  Service: Cardiovascular;  Laterality: N/A;  . ELECTROPHYSIOLOGIC STUDY N/A 12/11/2015   Procedure: Cardioversion;  Surgeon: Jaydis Duchene [name], MD;  Location: [facility];  Service: Cardiovascular;  Laterality: N/A;  . EXCISION MASS HEAD N/A 01/06/2017   Procedure: EXCISION MASS FOREHEAD;  Surgeon: Irene Limbo, MD;  Location: [facility];  Service: Plastics;  Laterality: N/A;  . GANGLION CYST EXCISION Left   . INTERCOSTAL NERVE BLOCK  2005  . KNEE ARTHROSCOPY Right 2016     Current Outpatient Medications  Medication Sig Dispense Refill  . albuterol (PROAIR HFA) 108 (90 Base) MCG/ACT inhaler Inhale 2 puffs into the lungs every 4 (four) hours  as needed for wheezing or shortness of breath. 18 g 2  . amoxicillin-clavulanate (AUGMENTIN) 875-125 MG tablet Take one tablet once daily for 10 days. 10 tablet 0  . apixaban (ELIQUIS) 5 MG TABS tablet Take 1 tablet (5 mg total) by mouth 2 (two) times daily. 60 tablet 5  . azelastine (ASTELIN) 0.1 % nasal spray Place 1 spray into both nostrils 2 (two) times daily. 30 mL 5  . Azelastine HCl 0.15 % SOLN Place 1  spray into both nostrils 2 (two) times daily. 30 mL 5  . carvedilol (COREG) 12.5 MG tablet TAKE 1 TABLET (12.5 MG TOTAL) BY MOUTH 2 (TWO) TIMES DAILY. 60 tablet 3  . cetirizine (ZYRTEC) 10 MG tablet Take 1 tablet 1-2 times daily as needed. 60 tablet 5  . CYMBALTA 30 MG capsule Take by mouth daily.    Marland Kitchen diltiazem (CARDIZEM CD) 300 MG 24 hr capsule TAKE 1 CAPSULE BY MOUTH EVERY DAY 90 capsule 3  . dronedarone (MULTAQ) 400 MG tablet Take 1 tablet (400 mg total) by mouth 2 (two) times daily with a meal. 60 tablet 6  . fluticasone (FLONASE) 50 MCG/ACT nasal spray Place 1 spray into both nostrils 2 (two) times daily. 16 g 5  . fluticasone furoate-vilanterol (BREO ELLIPTA) 200-25 MCG/INH AEPB Inhale 1 puff into the lungs daily. 60 each 2  . Fluticasone-Salmeterol (ADVAIR) 500-50 MCG/DOSE AEPB Inhale 1 puff into the lungs daily at 12 noon. 60 each 5  . hydrALAZINE (APRESOLINE) 100 MG tablet TAKE 1 TABLET (100 MG TOTAL) BY MOUTH 3 (THREE) TIMES DAILY. 270 tablet 1  . montelukast (SINGULAIR) 10 MG tablet Take 1 tablet (10 mg total) by mouth at bedtime. 30 tablet 5  . Olopatadine HCl (PATADAY) 0.2 % SOLN Use one drop in each eye once daily as needed. 2.5 mL 5  . Olopatadine HCl (PAZEO) 0.7 % SOLN Place 1 drop into both eyes 2 (two) times a day. 1 Bottle 5  . omeprazole (PRILOSEC) 20 MG capsule TAKE 1 CAPSULE BY MOUTH EVERY DAY 30 capsule 6  . Oxycodone HCl 10 MG TABS TAKE 1 (ONE) TABLET FOUR TIMES DAILY, AS NEEDED    . prazosin (MINIPRESS) 1 MG capsule TAKE 1 CAPSULE BY MOUTH EVERYDAY AT BEDTIME  2  . predniSONE (DELTASONE) 10 MG tablet Take one tablet once daily for 10 days. 10 tablet 0  . pregabalin (LYRICA) 75 MG capsule Take 1 capsule by mouth 4 (four) times daily as needed.     No current facility-administered medications for this visit.    Allergies:   Lisinopril   Social History:  The patient  reports that he has quit smoking. He quit after 29.00 years of use. He has never used smokeless tobacco.  He reports current alcohol use. He reports current drug use. Drug: Marijuana.   Family History:  The patient's family history includes Heart disease in his father; Schizophrenia in his sister.   ROS:  Please see the history of present illness.   Otherwise, review of systems is positive for none.   All other systems are reviewed and negative.   PHYSICAL EXAM: VS:  BP 130/78   Pulse 85   Ht 6\' 1"  (1.854 m)   Wt 254 lb (115.2 kg)   BMI 33.51 kg/m  , BMI Body mass index is 33.51 kg/m. GEN: Well nourished, well developed, in no acute distress  HEENT: normal  Neck: no JVD, carotid bruits, or masses Cardiac: RRR; no murmurs, rubs, or gallops,no edema  Respiratory:  clear to auscultation bilaterally, normal work of breathing GI: soft, nontender, nondistended, + BS MS: no deformity or atrophy  Skin: warm and dry Neuro:  Strength and sensation are intact Psych: euthymic mood, full affect  EKG:  EKG is ordered today. Personal review of the ekg ordered shows sinus rhythm, rate 85, PVC  Recent Labs: 08/08/2019: BUN 15; Creatinine, Ser 1.49; Hemoglobin 14.7; Platelets 224; Potassium 4.1; Sodium 138    Lipid Panel     Component Value Date/Time   CHOL 171 05/07/2018 1004   TRIG 204 (H) 05/07/2018 1004   HDL 33 (L) 05/07/2018 1004   CHOLHDL 5.2 (H) 05/07/2018 1004   CHOLHDL 5.4 10/17/2013 1042   VLDL 36 10/17/2013 1042   LDLCALC 97 05/07/2018 1004     Wt Readings from Last 3 Encounters:  11/19/19 254 lb (115.2 kg)  07/16/19 265 lb (120.2 kg)  06/11/19 260 lb 8 oz (118.2 kg)     Other studies Reviewed: Additional studies/ records that were reviewed today include:  SPECT 05/15/15, TTE 03/31/15 Review of the above records today demonstrates:  SPECT  The left ventricular ejection fraction is mildly decreased (45-54%).  Nuclear stress EF: 50%.  There was no ST segment deviation noted during stress.  Defect 1: Moderate sized, mild in intensity, fixed defect in the mid and basal  inferior and basal inferolateral wall consistent with diaphragmatic attenuation. No ischemia noted.  This is a low risk study.  TTE - Normal LV systolic function; grade 1 diastolic dysfunction; mildLAE.  ETT 01/26/17 - personally reviewed  Blood pressure demonstrated a normal response to exercise.  There was no ST segment deviation noted during stress.  Negative study for exercise induced ischemia.  There were frequent PVCs, ventricular couplets and ventricular salvos.   ASSESSMENT AND PLAN:  1.  Paroxysmal atrial fibrillation: Currently on Eliquis and Multaq.  CHA2DS2-VASc of 3.  He remains in sinus rhythm.  He has had a few episodes of atrial fibrillation but they have lasted only a few minutes.  He is content with his overall control.  2. Hypertension: Currently well controlled  3. Syncope: No further episodes  Current medicines are reviewed at length with the patient today.   The patient does not have concerns regarding his medicines.  The following changes were made today: None  Labs/ tests ordered today include:  Orders Placed This Encounter  Procedures  . EKG 12-Lead     Disposition:   FU with Lucus Lambertson 6 months.  Signed, Forest Redwine [name], MD  11/19/2019 9:39 AM     [facility] [address] [place] [address] [phone] (office) [phone] (fax)

## 2019-11-22 ENCOUNTER — Ambulatory Visit (INDEPENDENT_AMBULATORY_CARE_PROVIDER_SITE_OTHER): Payer: Medicaid Other | Admitting: *Deleted

## 2019-11-22 DIAGNOSIS — J309 Allergic rhinitis, unspecified: Secondary | ICD-10-CM

## 2019-12-02 ENCOUNTER — Ambulatory Visit (INDEPENDENT_AMBULATORY_CARE_PROVIDER_SITE_OTHER): Payer: Medicaid Other

## 2019-12-02 DIAGNOSIS — J309 Allergic rhinitis, unspecified: Secondary | ICD-10-CM | POA: Diagnosis not present

## 2019-12-10 ENCOUNTER — Ambulatory Visit: Payer: Medicaid Other | Admitting: Neurology

## 2019-12-10 ENCOUNTER — Other Ambulatory Visit: Payer: Self-pay

## 2019-12-10 NOTE — Progress Notes (Deleted)
[name] DOB: September 30, 1960  REASON FOR VISIT: follow up HISTORY FROM: patient  HISTORY OF PRESENT ILLNESS: Today 12/10/19  [name] is a 59 year old male with history of chronic low back pain and neck discomfort.  He indicates previously steroids resulted in atrial fibrillation.  HISTORY 06/11/2019 Dr. Jannifer Franklin: [name] is a 59 year old right-handed black male with a history of chronic low back pain and some neck discomfort.  The patient has just recently completed physical therapy for the neck and back in the last several weeks, he had recurrence of his low back pain spontaneously 2 days ago.  He is having muscle spasms in the low back with some discomfort going down the left leg with some numbness in the left leg.  This is similar to his previous episodes.  The patient last had MRI of the lumbar spine in 2019.  The patient claims that in the past a trial on steroids has resulted in atrial fibrillation.  He returns for an evaluation.  He has oxycodone at home to take if he needs a pain reliever.   REVIEW OF SYSTEMS: Out of a complete 14 system review of symptoms, the patient complains only of the following symptoms, and all other reviewed systems are negative.  ALLERGIES: Allergies  Allergen Reactions  . Lisinopril Anaphylaxis    Swelling of lips and tongue.    HOME MEDICATIONS: Outpatient Medications Prior to Visit  Medication Sig Dispense Refill  . albuterol (PROAIR HFA) 108 (90 Base) MCG/ACT inhaler Inhale 2 puffs into the lungs every 4 (four) hours as needed for wheezing or shortness of breath. 18 g 2  . amoxicillin-clavulanate (AUGMENTIN) 875-125 MG tablet Take one tablet once daily for 10 days. 10 tablet 0  . apixaban (ELIQUIS) 5 MG TABS tablet Take 1 tablet (5 mg total) by mouth 2 (two) times daily. 60 tablet 5  . azelastine (ASTELIN) 0.1 % nasal spray Place 1 spray into both nostrils 2 (two) times daily. 30 mL 5  . Azelastine HCl 0.15 % SOLN Place 1 spray into  both nostrils 2 (two) times daily. 30 mL 5  . carvedilol (COREG) 12.5 MG tablet TAKE 1 TABLET (12.5 MG TOTAL) BY MOUTH 2 (TWO) TIMES DAILY. 60 tablet 3  . cetirizine (ZYRTEC) 10 MG tablet Take 1 tablet 1-2 times daily as needed. 60 tablet 5  . CYMBALTA 30 MG capsule Take by mouth daily.    Marland Kitchen diltiazem (CARDIZEM CD) 300 MG 24 hr capsule TAKE 1 CAPSULE BY MOUTH EVERY DAY 90 capsule 3  . dronedarone (MULTAQ) 400 MG tablet Take 1 tablet (400 mg total) by mouth 2 (two) times daily with a meal. 60 tablet 6  . fluticasone (FLONASE) 50 MCG/ACT nasal spray Place 1 spray into both nostrils 2 (two) times daily. 16 g 5  . fluticasone furoate-vilanterol (BREO ELLIPTA) 200-25 MCG/INH AEPB Inhale 1 puff into the lungs daily. 60 each 2  . Fluticasone-Salmeterol (ADVAIR) 500-50 MCG/DOSE AEPB Inhale 1 puff into the lungs daily at 12 noon. 60 each 5  . hydrALAZINE (APRESOLINE) 100 MG tablet TAKE 1 TABLET (100 MG TOTAL) BY MOUTH 3 (THREE) TIMES DAILY. 270 tablet 1  . montelukast (SINGULAIR) 10 MG tablet Take 1 tablet (10 mg total) by mouth at bedtime. 30 tablet 5  . Olopatadine HCl (PATADAY) 0.2 % SOLN Use one drop in each eye once daily as needed. 2.5 mL 5  . Olopatadine HCl (PAZEO) 0.7 % SOLN Place 1 drop into both eyes 2 (two)  times a day. 1 Bottle 5  . omeprazole (PRILOSEC) 20 MG capsule TAKE 1 CAPSULE BY MOUTH EVERY DAY 30 capsule 6  . Oxycodone HCl 10 MG TABS TAKE 1 (ONE) TABLET FOUR TIMES DAILY, AS NEEDED    . prazosin (MINIPRESS) 1 MG capsule TAKE 1 CAPSULE BY MOUTH EVERYDAY AT BEDTIME  2  . predniSONE (DELTASONE) 10 MG tablet Take one tablet once daily for 10 days. 10 tablet 0  . pregabalin (LYRICA) 75 MG capsule Take 1 capsule by mouth 4 (four) times daily as needed.     No facility-administered medications prior to visit.    PAST MEDICAL HISTORY: Past Medical History:  Diagnosis Date  . Anxiety   . Bilateral carpal tunnel syndrome 01/10/2018  . Bipolar disorder ([place])   . Chronic lower back pain     . Depression   . Dysrhythmia    a-fib  . GERD (gastroesophageal reflux disease)   . History of alcohol abuse   . History of nuclear stress test    Myoview 10/16: EF 50%, diaphragmatic attenuation, no ischemia, low risk  . Hypertension   . Migraine 2012-2014  . Moderate persistent asthma with acute exacerbation 05/02/2018  . PAF (paroxysmal atrial fibrillation) ([place]) 03/27/2015   a. Myoview neg for ischemia >> Flecainide started 10/16 >> FU ETT   . Schizophrenia ([place])   . Sciatica neuralgia   . Small vessel disease ([place])    Right basal ganglia stroke  . Stroke [facility]) "between 2012-2014"   residual "AF" (09/22/2015)    PAST SURGICAL HISTORY: Past Surgical History:  Procedure Laterality Date  . ATRIAL FIBRILLATION ABLATION  09/22/2015  . CYST EXCISION  1996-97   surgery back of head   . ELECTROPHYSIOLOGIC STUDY N/A 09/22/2015   Procedure: Atrial Fibrillation Ablation;  Surgeon: Will [name], MD;  Location: [facility];  Service: Cardiovascular;  Laterality: N/A;  . ELECTROPHYSIOLOGIC STUDY N/A 12/10/2015   Procedure: Atrial Fibrillation Ablation;  Surgeon: Will [name], MD;  Location: [place] [facility];  Service: Cardiovascular;  Laterality: N/A;  . ELECTROPHYSIOLOGIC STUDY N/A 12/11/2015   Procedure: Cardioversion;  Surgeon: Will [name], MD;  Location: [facility];  Service: Cardiovascular;  Laterality: N/A;  . EXCISION MASS HEAD N/A 01/06/2017   Procedure: EXCISION MASS FOREHEAD;  Surgeon: Irene Limbo, MD;  Location: [facility];  Service: Plastics;  Laterality: N/A;  . GANGLION CYST EXCISION Left   . INTERCOSTAL NERVE BLOCK  2005  . KNEE ARTHROSCOPY Right 2016    FAMILY HISTORY: Family History  Problem Relation Age of Onset  . Heart disease Father   . Schizophrenia Sister     SOCIAL HISTORY: Social History   Socioeconomic History  . Marital status: Single    Spouse name: [name]  . Number of children: 3  . Years of  education: HS  . Highest education level: Not on file  Occupational History    Comment: disabled  Tobacco Use  . Smoking status: Former Smoker    Years: 29.00  . Smokeless tobacco: Never Used  Substance and Sexual Activity  . Alcohol use: Yes    Alcohol/week: 0.0 standard drinks    Comment: drinks social  . Drug use: Yes    Types: Marijuana    Comment: smoked marijuana 01-02-17 for pain  . Sexual activity: Not Currently  Other Topics Concern  . Not on file  Social History Narrative   Patient lives at home with [place].    Patient  has 3 children 2 step.    Patient has 13 years of schooling.    Patient is right handed.    Social Determinants of Health   Financial Resource Strain:   . Difficulty of Paying Living Expenses:   Food Insecurity:   . Worried About Charity fundraiser in the Last Year:   . Arboriculturist in the Last Year:   Transportation Needs:   . Film/video editor (Medical):   Marland Kitchen Lack of Transportation (Non-Medical):   Physical Activity:   . Days of Exercise per Week:   . Minutes of Exercise per Session:   Stress:   . Feeling of Stress :   Social Connections:   . Frequency of Communication with Friends and Family:   . Frequency of Social Gatherings with Friends and Family:   . Attends Religious Services:   . Active Member of Clubs or Organizations:   . Attends Archivist Meetings:   Marland Kitchen Marital Status:   Intimate Partner Violence:   . Fear of Current or Ex-Partner:   . Emotionally Abused:   Marland Kitchen Physically Abused:   . Sexually Abused:       PHYSICAL EXAM  There were no vitals filed for this visit. There is no height or weight on file to calculate BMI.  Generalized: Well developed, in no acute distress   Neurological examination  Mentation: Alert oriented to time, place, history taking. Follows all commands speech and language fluent Cranial nerve II-XII: Pupils were equal round reactive to light. Extraocular movements were full,  visual field were full on confrontational test. Facial sensation and strength were normal. Uvula tongue midline. Head turning and shoulder shrug  were normal and symmetric. Motor: The motor testing reveals 5 over 5 strength of all 4 extremities. Good symmetric motor tone is noted throughout.  Sensory: Sensory testing is intact to soft touch on all 4 extremities. No evidence of extinction is noted.  Coordination: Cerebellar testing reveals good finger-nose-finger and heel-to-shin bilaterally.  Gait and station: Gait is normal. Tandem gait is normal. Romberg is negative. No drift is seen.  Reflexes: Deep tendon reflexes are symmetric and normal bilaterally.   DIAGNOSTIC DATA (LABS, IMAGING, TESTING) - I reviewed patient records, labs, notes, testing and imaging myself where available.  Lab Results  Component Value Date   WBC 5.8 08/08/2019   HGB 14.7 08/08/2019   HCT 46.7 08/08/2019   MCV 79.2 (L) 08/08/2019   PLT 224 08/08/2019      Component Value Date/Time   NA 138 08/08/2019 1430   NA 142 08/14/2018 1044   K 4.1 08/08/2019 1430   CL 104 08/08/2019 1430   CO2 24 08/08/2019 1430   GLUCOSE 95 08/08/2019 1430   BUN 15 08/08/2019 1430   BUN 13 08/14/2018 1044   CREATININE 1.49 (H) 08/08/2019 1430   CREATININE 1.48 (H) 11/30/2015 1217   CALCIUM 9.3 08/08/2019 1430   PROT 7.0 08/14/2018 1044   ALBUMIN 4.2 08/14/2018 1044   AST 16 08/14/2018 1044   ALT 22 08/14/2018 1044   ALKPHOS 76 08/14/2018 1044   BILITOT 0.3 08/14/2018 1044   GFRNONAA 51 (L) 08/08/2019 1430   GFRNONAA 65 10/04/2013 1102   GFRAA 59 (L) 08/08/2019 1430   GFRAA 75 10/04/2013 1102   Lab Results  Component Value Date   CHOL 171 05/07/2018   HDL 33 (L) 05/07/2018   LDLCALC 97 05/07/2018   TRIG 204 (H) 05/07/2018   CHOLHDL 5.2 (H) 05/07/2018  Lab Results  Component Value Date   HGBA1C 5.7 04/03/2017   Lab Results  Component Value Date   VITAMINB12 240 02/12/2014   Lab Results  Component Value Date     TSH 1.010 09/13/2016      ASSESSMENT AND PLAN 59 y.o. year old male  has a past medical history of Anxiety, Bilateral carpal tunnel syndrome (01/10/2018), Bipolar disorder ([place]), Chronic lower back pain, Depression, Dysrhythmia, GERD (gastroesophageal reflux disease), History of alcohol abuse, History of nuclear stress test, Hypertension, Migraine (2012-2014), Moderate persistent asthma with acute exacerbation (05/02/2018), PAF (paroxysmal atrial fibrillation) ([place]) (03/27/2015), Schizophrenia ([place]), Sciatica neuralgia, Small vessel disease ([place]), and Stroke ([place]) ("between 2012-2014"). here with ***   I spent 15 minutes with the patient. 50% of this time was spent   Butler Denmark, [place], DNP 12/10/2019, 5:19 AM [facility] [address], [place], [place] [address] [phone]

## 2019-12-11 ENCOUNTER — Ambulatory Visit (INDEPENDENT_AMBULATORY_CARE_PROVIDER_SITE_OTHER): Payer: Medicaid Other

## 2019-12-11 DIAGNOSIS — J309 Allergic rhinitis, unspecified: Secondary | ICD-10-CM | POA: Diagnosis not present

## 2019-12-20 ENCOUNTER — Ambulatory Visit (INDEPENDENT_AMBULATORY_CARE_PROVIDER_SITE_OTHER): Payer: Medicaid Other

## 2019-12-20 DIAGNOSIS — J309 Allergic rhinitis, unspecified: Secondary | ICD-10-CM

## 2019-12-26 ENCOUNTER — Ambulatory Visit (INDEPENDENT_AMBULATORY_CARE_PROVIDER_SITE_OTHER): Payer: Medicaid Other

## 2019-12-26 DIAGNOSIS — J309 Allergic rhinitis, unspecified: Secondary | ICD-10-CM

## 2020-01-03 ENCOUNTER — Other Ambulatory Visit: Payer: Self-pay

## 2020-01-03 ENCOUNTER — Ambulatory Visit (INDEPENDENT_AMBULATORY_CARE_PROVIDER_SITE_OTHER): Payer: Medicaid Other

## 2020-01-03 ENCOUNTER — Ambulatory Visit: Payer: Medicaid Other | Attending: Family Medicine

## 2020-01-03 DIAGNOSIS — H2012 Chronic iridocyclitis, left eye: Secondary | ICD-10-CM

## 2020-01-03 DIAGNOSIS — J309 Allergic rhinitis, unspecified: Secondary | ICD-10-CM

## 2020-01-07 ENCOUNTER — Telehealth: Payer: Self-pay

## 2020-01-07 NOTE — Telephone Encounter (Signed)
Patient name and DOB has been verified Patient was informed of lab results. Patient had no questions.  

## 2020-01-07 NOTE — Telephone Encounter (Signed)
-----   Message from Charlott Rakes, MD sent at 01/07/2020  1:36 PM EDT ----- Lab results so far have been negative. Please obtain the name of his ophthalmologist so labs can be faxed to him. Thank you

## 2020-01-08 ENCOUNTER — Other Ambulatory Visit: Payer: Self-pay

## 2020-01-08 ENCOUNTER — Other Ambulatory Visit: Payer: Self-pay | Admitting: Ophthalmology

## 2020-01-08 ENCOUNTER — Ambulatory Visit
Admission: RE | Admit: 2020-01-08 | Discharge: 2020-01-08 | Disposition: A | Discharge status: Home / Self Care | Payer: Medicaid Other | Source: Ambulatory Visit | Attending: Ophthalmology | Admitting: Ophthalmology

## 2020-01-08 DIAGNOSIS — H2012 Chronic iridocyclitis, left eye: Secondary | ICD-10-CM

## 2020-01-09 LAB — CMP14+EGFR
ALT: 14 IU/L (ref 0–44)
AST: 13 IU/L (ref 0–40)
Albumin/Globulin Ratio: 1.4 (ref 1.2–2.2)
Albumin: 4.3 g/dL (ref 3.8–4.9)
Alkaline Phosphatase: 90 IU/L (ref 48–121)
BUN/Creatinine Ratio: 8 — ABNORMAL LOW (ref 9–20)
BUN: 9 mg/dL (ref 6–24)
Bilirubin Total: 0.4 mg/dL (ref 0.0–1.2)
CO2: 21 mmol/L (ref 20–29)
Calcium: 9.3 mg/dL (ref 8.7–10.2)
Chloride: 102 mmol/L (ref 96–106)
Creatinine, Ser: 1.17 mg/dL (ref 0.76–1.27)
GFR calc Af Amer: 79 mL/min/{1.73_m2} (ref >59)
GFR calc non Af Amer: 68 mL/min/{1.73_m2} (ref >59)
Globulin, Total: 3 g/dL (ref 1.5–4.5)
Glucose: 177 mg/dL — ABNORMAL HIGH (ref 65–99)
Potassium: 3.6 mmol/L (ref 3.5–5.2)
Sodium: 138 mmol/L (ref 134–144)
Total Protein: 7.3 g/dL (ref 6.0–8.5)

## 2020-01-09 LAB — CBC
Hematocrit: 45.2 % (ref 37.5–51.0)
Hemoglobin: 14.2 g/dL (ref 13.0–17.7)
MCH: 24.7 pg — ABNORMAL LOW (ref 26.6–33.0)
MCHC: 31.4 g/dL — ABNORMAL LOW (ref 31.5–35.7)
MCV: 79 fL (ref 79–97)
Platelets: 215 10*3/uL (ref 150–450)
RBC: 5.75 x10E6/uL (ref 4.14–5.80)
RDW: 15.4 % (ref 11.6–15.4)
WBC: 4.8 10*3/uL (ref 3.4–10.8)

## 2020-01-09 LAB — ANGIOTENSIN CONVERTING ENZYME: Angio Convert Enzyme: 25 U/L (ref 14–82)

## 2020-01-09 LAB — RPR: RPR Ser Ql: NONREACTIVE

## 2020-01-09 LAB — HLA-B27 ANTIGEN: HLA B27: NEGATIVE

## 2020-01-14 ENCOUNTER — Encounter: Payer: Self-pay | Admitting: Neurology

## 2020-01-14 ENCOUNTER — Ambulatory Visit: Payer: Medicaid Other | Admitting: Neurology

## 2020-01-14 VITALS — BP 126/84 | HR 84 | Ht 73.0 in | Wt 258.0 lb

## 2020-01-14 DIAGNOSIS — M5441 Lumbago with sciatica, right side: Secondary | ICD-10-CM | POA: Diagnosis not present

## 2020-01-14 DIAGNOSIS — M5416 Radiculopathy, lumbar region: Secondary | ICD-10-CM | POA: Diagnosis not present

## 2020-01-14 DIAGNOSIS — G8929 Other chronic pain: Secondary | ICD-10-CM | POA: Diagnosis not present

## 2020-01-14 MED ORDER — METHOCARBAMOL 500 MG PO TABS: 500.0000 mg | ORAL_TABLET | Freq: Three times a day (TID) | ORAL | 1 refills | Status: DC | PRN

## 2020-01-14 NOTE — Patient Instructions (Signed)
I will reorder the Robaxin to take as needed for muscle spasms I have ordered neuromuscular therapy for the low back pain  See you back in 6 months
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
## 2020-01-16 ENCOUNTER — Encounter: Payer: Self-pay | Admitting: Family Medicine

## 2020-01-16 ENCOUNTER — Ambulatory Visit (INDEPENDENT_AMBULATORY_CARE_PROVIDER_SITE_OTHER): Payer: Medicaid Other

## 2020-01-16 ENCOUNTER — Other Ambulatory Visit: Payer: Self-pay

## 2020-01-16 ENCOUNTER — Ambulatory Visit: Payer: Medicaid Other | Attending: Family Medicine | Admitting: Family Medicine

## 2020-01-16 VITALS — BP 136/68 | HR 84 | Ht 73.0 in | Wt 253.0 lb

## 2020-01-16 DIAGNOSIS — J309 Allergic rhinitis, unspecified: Secondary | ICD-10-CM | POA: Diagnosis not present

## 2020-01-16 DIAGNOSIS — F419 Anxiety disorder, unspecified: Secondary | ICD-10-CM | POA: Diagnosis not present

## 2020-01-16 DIAGNOSIS — Z8673 Personal history of transient ischemic attack (TIA), and cerebral infarction without residual deficits: Secondary | ICD-10-CM | POA: Diagnosis not present

## 2020-01-16 DIAGNOSIS — Z79899 Other long term (current) drug therapy: Secondary | ICD-10-CM | POA: Insufficient documentation

## 2020-01-16 DIAGNOSIS — J0140 Acute pansinusitis, unspecified: Secondary | ICD-10-CM

## 2020-01-16 DIAGNOSIS — M5116 Intervertebral disc disorders with radiculopathy, lumbar region: Secondary | ICD-10-CM | POA: Diagnosis not present

## 2020-01-16 DIAGNOSIS — Z888 Allergy status to other drugs, medicaments and biological substances status: Secondary | ICD-10-CM | POA: Insufficient documentation

## 2020-01-16 DIAGNOSIS — I480 Paroxysmal atrial fibrillation: Secondary | ICD-10-CM

## 2020-01-16 DIAGNOSIS — K219 Gastro-esophageal reflux disease without esophagitis: Secondary | ICD-10-CM | POA: Insufficient documentation

## 2020-01-16 DIAGNOSIS — F319 Bipolar disorder, unspecified: Secondary | ICD-10-CM | POA: Insufficient documentation

## 2020-01-16 DIAGNOSIS — M62830 Muscle spasm of back: Secondary | ICD-10-CM | POA: Diagnosis not present

## 2020-01-16 DIAGNOSIS — Z7952 Long term (current) use of systemic steroids: Secondary | ICD-10-CM | POA: Diagnosis not present

## 2020-01-16 DIAGNOSIS — Z8249 Family history of ischemic heart disease and other diseases of the circulatory system: Secondary | ICD-10-CM | POA: Diagnosis not present

## 2020-01-16 DIAGNOSIS — R072 Precordial pain: Secondary | ICD-10-CM | POA: Diagnosis not present

## 2020-01-16 DIAGNOSIS — E1151 Type 2 diabetes mellitus with diabetic peripheral angiopathy without gangrene: Secondary | ICD-10-CM | POA: Diagnosis not present

## 2020-01-16 DIAGNOSIS — F209 Schizophrenia, unspecified: Secondary | ICD-10-CM | POA: Diagnosis not present

## 2020-01-16 DIAGNOSIS — I10 Essential (primary) hypertension: Secondary | ICD-10-CM

## 2020-01-16 DIAGNOSIS — M62838 Other muscle spasm: Secondary | ICD-10-CM

## 2020-01-16 DIAGNOSIS — Z7951 Long term (current) use of inhaled steroids: Secondary | ICD-10-CM | POA: Insufficient documentation

## 2020-01-16 DIAGNOSIS — Z7901 Long term (current) use of anticoagulants: Secondary | ICD-10-CM | POA: Insufficient documentation

## 2020-01-16 MED ORDER — AMOXICILLIN 500 MG PO CAPS: 500.0000 mg | ORAL_CAPSULE | Freq: Three times a day (TID) | ORAL | 0 refills | Status: DC

## 2020-01-16 MED ORDER — LIDOCAINE 5 % EX PTCH: 1.0000 | MEDICATED_PATCH | CUTANEOUS | 0 refills | Status: DC

## 2020-01-16 MED ORDER — CARVEDILOL 12.5 MG PO TABS: 12.5000 mg | ORAL_TABLET | Freq: Two times a day (BID) | ORAL | 3 refills | Status: DC

## 2020-01-16 NOTE — Progress Notes (Addendum)
Subjective:  Patient ID: Jason Stokes, male    DOB: 01-21-1961  Age: 59 y.o. MRN: [id number]  CC: Allergies and Back Pain   HPI Jason Stokes is a 59 year old male with a history of hypertension, type 2 diabetes mellitus (A1c 5.7), bipolar disorder, paroxysmal A. 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  He has had a 2 week history of intermittent right sided sharp pains 10/10 He also has pain in his left chest wall for 2 weeks unrelated to activity. Deep breathing makes L chest wall pain better and pain is absent at the moment but was present this morning.  Cutting the grass causes his right side to hurt. R side pain is absent now but returns when he is getting into the car. Sometimes has GERD which is controlled on PPI. States he is not in Afib as he knows what Afib feels like. Last moved his bowel today Receives Oxycodone from the [facility] and has a f/u on 7/8  [name] He complains of feeling stuffy with nasal congestion and his nasal spray has been ineffective. He had pressure in his face but no fever or myalgias.  Currently receiving immunotherapy for allergies; 3 months ago he was treated with Augmentin by his allergist for similar symptoms.  Past Medical History:  Diagnosis Date  . Anxiety   . Bilateral carpal tunnel syndrome 01/10/2018  . Bipolar disorder ([place])   . Chronic lower back pain   . Depression   . Dysrhythmia    a-fib  . GERD (gastroesophageal reflux disease)   . History of alcohol abuse   . History of nuclear stress test    Myoview 10/16: EF 50%, diaphragmatic attenuation, no ischemia, low risk  . Hypertension   . Migraine 2012-2014  . Moderate persistent asthma with acute exacerbation 05/02/2018  . PAF  (paroxysmal atrial fibrillation) ([place]) 03/27/2015   a. Myoview neg for ischemia >> Flecainide started 10/16 >> FU ETT   . Schizophrenia ([place])   . Sciatica neuralgia   . Small vessel disease ([place])    Right basal ganglia stroke  . Stroke [facility]) "between 2012-2014"   residual "AF" (09/22/2015)    Past Surgical History:  Procedure Laterality Date  . ATRIAL FIBRILLATION ABLATION  09/22/2015  . CYST EXCISION  1996-97   surgery back of head   . ELECTROPHYSIOLOGIC STUDY N/A 09/22/2015   Procedure: Atrial Fibrillation Ablation;  Surgeon: Jason [name], MD;  Location: [place] [facility];  Service: Cardiovascular;  Laterality: N/A;  . ELECTROPHYSIOLOGIC STUDY N/A 12/10/2015   Procedure: Atrial Fibrillation Ablation;  Surgeon: Jason [name], MD;  Location: [place] [facility];  Service: Cardiovascular;  Laterality: N/A;  . ELECTROPHYSIOLOGIC STUDY N/A 12/11/2015   Procedure: Cardioversion;  Surgeon: Jason [name], MD;  Location: [place] [facility];  Service: Cardiovascular;  Laterality: N/A;  . EXCISION MASS HEAD N/A 01/06/2017   Procedure: EXCISION MASS FOREHEAD;  Surgeon: Irene Limbo, MD;  Location: [place];  Service: Plastics;  Laterality: N/A;  . GANGLION CYST EXCISION Left   . INTERCOSTAL NERVE BLOCK  2005  . KNEE ARTHROSCOPY Right 2016    Family History  Problem Relation Age of Onset  . Heart disease Father   . Schizophrenia Sister  Allergies  Allergen Reactions  . Lisinopril Anaphylaxis    Swelling of lips and tongue.    Outpatient Medications Prior to Visit  Medication Sig Dispense Refill  . albuterol (PROAIR HFA) 108 (90 Base) MCG/ACT inhaler Inhale 2 puffs into the lungs every 4 (four) hours as needed for wheezing or shortness of breath. 18 g 2  . amoxicillin-clavulanate (AUGMENTIN) 875-125 MG tablet Take one tablet once daily for 10 days. 10 tablet 0  . apixaban (ELIQUIS) 5 MG TABS tablet Take 1 tablet (5 mg total) by mouth 2 (two) times  daily. 60 tablet 5  . azelastine (ASTELIN) 0.1 % nasal spray Place 1 spray into both nostrils 2 (two) times daily. 30 mL 5  . Azelastine HCl 0.15 % SOLN Place 1 spray into both nostrils 2 (two) times daily. 30 mL 5  . carvedilol (COREG) 12.5 MG tablet TAKE 1 TABLET (12.5 MG TOTAL) BY MOUTH 2 (TWO) TIMES DAILY. 60 tablet 3  . cetirizine (ZYRTEC) 10 MG tablet Take 1 tablet 1-2 times daily as needed. 60 tablet 5  . CYMBALTA 30 MG capsule Take by mouth daily.    Marland Kitchen diltiazem (CARDIZEM CD) 300 MG 24 hr capsule TAKE 1 CAPSULE BY MOUTH EVERY DAY 90 capsule 3  . dronedarone (MULTAQ) 400 MG tablet Take 1 tablet (400 mg total) by mouth 2 (two) times daily with a meal. 60 tablet 6  . fluticasone (FLONASE) 50 MCG/ACT nasal spray Place 1 spray into both nostrils 2 (two) times daily. 16 g 5  . fluticasone furoate-vilanterol (BREO ELLIPTA) 200-25 MCG/INH AEPB Inhale 1 puff into the lungs daily. 60 each 2  . Fluticasone-Salmeterol (ADVAIR) 500-50 MCG/DOSE AEPB Inhale 1 puff into the lungs daily at 12 noon. 60 each 5  . hydrALAZINE (APRESOLINE) 100 MG tablet TAKE 1 TABLET (100 MG TOTAL) BY MOUTH 3 (THREE) TIMES DAILY. 270 tablet 1  . methocarbamol (ROBAXIN) 500 MG tablet Take 1 tablet (500 mg total) by mouth every 8 (eight) hours as needed for muscle spasms. 30 tablet 1  . montelukast (SINGULAIR) 10 MG tablet Take 1 tablet (10 mg total) by mouth at bedtime. 30 tablet 5  . Olopatadine HCl (PATADAY) 0.2 % SOLN Use one drop in each eye once daily as needed. 2.5 mL 5  . Olopatadine HCl (PAZEO) 0.7 % SOLN Place 1 drop into both eyes 2 (two) times a day. 1 Bottle 5  . omeprazole (PRILOSEC) 20 MG capsule TAKE 1 CAPSULE BY MOUTH EVERY DAY 30 capsule 6  . Oxycodone HCl 10 MG TABS TAKE 1 (ONE) TABLET FOUR TIMES DAILY, AS NEEDED    . prazosin (MINIPRESS) 1 MG capsule TAKE 1 CAPSULE BY MOUTH EVERYDAY AT BEDTIME  2  . predniSONE (DELTASONE) 10 MG tablet Take one tablet once daily for 10 days. 10 tablet 0  . pregabalin (LYRICA)  75 MG capsule Take 1 capsule by mouth 4 (four) times daily as needed.     No facility-administered medications prior to visit.     ROS Review of Systems  Constitutional: Negative for activity change and appetite change.  HENT: Positive for congestion and sinus pressure. Negative for sore throat.   Eyes: Positive for visual disturbance.  Respiratory: Negative for cough, chest tightness and shortness of breath.   Cardiovascular: Positive for chest pain. Negative for leg swelling.  Gastrointestinal: Positive for abdominal pain. Negative for abdominal distention, constipation and diarrhea.  Endocrine: Negative.   Genitourinary: Negative for dysuria.  Musculoskeletal: Negative for joint swelling and myalgias.  See HPI  Skin: Negative for rash.  Allergic/Immunologic: Negative.   Neurological: Negative for weakness, light-headedness and numbness.  Psychiatric/Behavioral: Negative for dysphoric mood and suicidal ideas.    Objective:  BP 136/68   Pulse 84   Ht 6\' 1"  (1.854 m)   Wt 253 lb (114.8 kg)   SpO2 99%   BMI 33.38 kg/m   BP/Weight 01/16/2020 7/34/1937 9/0/2409  Systolic BP 735 329 924  Diastolic BP 68 84 78  Wt. (Lbs) 253 258 254  BMI 33.38 34.04 33.51  Some encounter information is confidential and restricted. Go to Review Flowsheets activity to see all data.      Physical Exam Constitutional:      Appearance: He is well-developed.  HENT:     Head:     Comments: Tenderness to percussion of frontal, maxillary and ethmoidal sinuses bilaterally    Right Ear: Tympanic membrane normal.     Left Ear: Tympanic membrane normal.  Neck:     Vascular: No JVD.  Cardiovascular:     Rate and Rhythm: Normal rate.     Heart sounds: Normal heart sounds. No murmur heard.   Pulmonary:     Effort: Pulmonary effort is normal.     Breath sounds: Normal breath sounds. No wheezing or rales.  Chest:     Chest wall: No tenderness (no reproducible chestwall TTP).  Abdominal:      General: Bowel sounds are normal. There is no distension.     Palpations: Abdomen is soft. There is no mass.     Tenderness: There is no abdominal tenderness.  Musculoskeletal:        General: Normal range of motion.     Right lower leg: No edema.     Left lower leg: No edema.     Comments: Tenderness to palpation of right side of her abdomen.  Negative Murphy sign Tenderness also elicited with range of motion and twisting  Neurological:     Mental Status: He is alert and oriented to person, place, and time.  Psychiatric:        Mood and Affect: Mood normal.     CMP Latest Ref Rng & Units 01/03/2020 08/08/2019 08/14/2018  Glucose 65 - 99 mg/dL 177(H) 95 111(H)  BUN 6 - 24 mg/dL 9 15 13   Creatinine 0.76 - 1.27 mg/dL 1.17 1.49(H) 1.32(H)  Sodium 134 - 144 mmol/L 138 138 142  Potassium 3.5 - 5.2 mmol/L 3.6 4.1 3.8  Chloride 96 - 106 mmol/L 102 104 104  CO2 20 - 29 mmol/L 21 24 22   Calcium 8.7 - 10.2 mg/dL 9.3 9.3 9.6  Total Protein 6.0 - 8.5 g/dL 7.3 - 7.0  Total Bilirubin 0.0 - 1.2 mg/dL 0.4 - 0.3  Alkaline Phos 48 - 121 IU/L 90 - 76  AST 0 - 40 IU/L 13 - 16  ALT 0 - 44 IU/L 14 - 22    Lipid Panel     Component Value Date/Time   CHOL 171 05/07/2018 1004   TRIG 204 (H) 05/07/2018 1004   HDL 33 (L) 05/07/2018 1004   CHOLHDL 5.2 (H) 05/07/2018 1004   CHOLHDL 5.4 10/17/2013 1042   VLDL 36 10/17/2013 1042   LDLCALC 97 05/07/2018 1004    CBC    Component Value Date/Time   WBC 4.8 01/03/2020 1129   WBC 5.8 08/08/2019 1430   RBC 5.75 01/03/2020 1129   RBC 5.90 (H) 08/08/2019 1430   HGB 14.2 01/03/2020 1129   HCT 45.2 01/03/2020  1129   PLT 215 01/03/2020 1129   MCV 79 01/03/2020 1129   MCH 24.7 (L) 01/03/2020 1129   MCH 24.9 (L) 08/08/2019 1430   MCHC 31.4 (L) 01/03/2020 1129   MCHC 31.5 08/08/2019 1430   RDW 15.4 01/03/2020 1129   LYMPHSABS 1.6 08/08/2019 1430   LYMPHSABS 2.0 09/13/2016 0832   MONOABS 0.7 08/08/2019 1430   EOSABS 0.2 08/08/2019 1430   EOSABS 0.1  09/13/2016 0832   BASOSABS 0.0 08/08/2019 1430   BASOSABS 0.0 09/13/2016 3291    Lab Results  Component Value Date   HGBA1C 5.7 04/03/2017    Assessment & Plan:  1. Muscle spasm This could explain his right-sided pain and left chest wall pain as pain is related to movement He is on oxycodone from the pain clinic and also has Robaxin We Jason add on Lidoderm patch Advised to apply heat - lidocaine (LIDODERM) 5 %; Place 1 patch onto the skin daily. Remove & Discard patch within 12 hours or as directed by MD  Dispense: 30 patch; Refill: 0  2. Acute non-recurrent pansinusitis Uncontrolled on Flonase, antihistamine We Jason place on antibiotics - amoxicillin (AMOXIL) 500 MG capsule; Take 1 capsule (500 mg total) by mouth 3 (three) times daily.  Dispense: 30 capsule; Refill: 0  3. Precordial pain EKG reveals normal sinus rhythm, no ST changes Likely of musculoskeletal origin Advised that if symptoms recur he Jason need to go to the ED  4. Paroxysmal atrial fibrillation (HCC) Currently in sinus rhythm Continue diltiazem for rate control and Eliquis for anticoagulation  5. Essential hypertension Controlled Counseled on blood pressure goal of less than 130/80, low-sodium, DASH diet, medication compliance, 150 minutes of moderate intensity exercise per week. Discussed medication compliance, adverse effects. - carvedilol (COREG) 12.5 MG tablet; Take 1 tablet (12.5 mg total) by mouth 2 (two) times daily.  Dispense: 60 tablet; Refill: 3   Return in about 6 months (around 07/18/2020) for chronic disease management.   Charlott Rakes, MD, FAAFP. [facility] and [address] [place], [place]   01/16/2020, 10:57 AM

## 2020-01-16 NOTE — Progress Notes (Signed)
I have read the note, and I agree with the clinical assessment and plan.  Hershall Benkert K Annison Birchard   

## 2020-01-16 NOTE — Progress Notes (Signed)
Having pain in left side around to back, has been there off and on for 2 weeks.

## 2020-01-16 NOTE — Patient Instructions (Signed)
Muscle Cramps and Spasms Muscle cramps and spasms are when muscles tighten by themselves. They usually get better within minutes. Muscle cramps are painful. They are usually stronger and last longer than muscle spasms. Muscle spasms may or may not be painful. They can last a few seconds or much longer. Cramps and spasms can affect any muscle, but they occur most often in the calf muscles of the leg. They are usually not caused by a serious problem. In many cases, the cause is not known. Some common causes include:  Doing more physical work or exercise than your body is ready for.  Using the muscles too much (overuse) by repeating certain movements too many times.  Staying in a certain position for a long time.  Playing a sport or doing an activity without preparing properly.  Using bad form or technique while playing a sport or doing an activity.  Not having enough water in your body (dehydration).  Injury.  Side effects of some medicines.  Low levels of the salts and minerals in your blood (electrolytes), such as low potassium or calcium. Follow these instructions at home: Managing pain and stiffness      Massage, stretch, and relax the muscle. Do this for many minutes at a time.  If told, put heat on tight or tense muscles as often as told by your doctor. Use the heat source that your doctor recommends, such as a moist heat pack or a heating pad. ? Place a towel between your skin and the heat source. ? Leave the heat on for 20-30 minutes. ? Remove the heat if your skin turns bright red. This is very important if you are not able to feel pain, heat, or cold. You may have a greater risk of getting burned.  If told, put ice on the affected area. This may help if you are sore or have pain after a cramp or spasm. ? Put ice in a plastic bag. ? Place a towel between your skin and the bag. ? Leave the ice on for 20 minutes, 2-3 times a day.  Try taking hot showers or baths to help  relax tight muscles. Eating and drinking  Drink enough fluid to keep your pee (urine) pale yellow.  Eat a healthy diet to help ensure that your muscles work well. This should include: ? Fruits and vegetables. ? Lean protein. ? Whole grains. ? Low-fat or nonfat dairy products. General instructions  If you are having cramps often, avoid intense exercise for several days.  Take over-the-counter and prescription medicines only as told by your doctor.  Watch for any changes in your symptoms.  Keep all follow-up visits as told by your doctor. This is important. Contact a doctor if:  Your cramps or spasms get worse or happen more often.  Your cramps or spasms do not get better with time. Summary  Muscle cramps and spasms are when muscles tighten by themselves. They usually get better within minutes.  Cramps and spasms occur most often in the calf muscles of the leg.  Massage, stretch, and relax the muscle. This may help the cramp or spasm go away.  Drink enough fluid to keep your pee (urine) pale yellow. This information is not intended to replace advice given to you by your health care provider. Make sure you discuss any questions you have with your health care provider. Document Revised: 11/27/2017 Document Reviewed: 11/27/2017 Elsevier Patient Education  2020 [employer]vier Inc[employer]  

## 2020-01-17 ENCOUNTER — Telehealth: Payer: Self-pay

## 2020-01-17 NOTE — Telephone Encounter (Signed)
Medicaid denied prior auth for Lidoderm Patches

## 2020-01-30 ENCOUNTER — Ambulatory Visit (INDEPENDENT_AMBULATORY_CARE_PROVIDER_SITE_OTHER): Payer: Medicaid Other

## 2020-01-30 DIAGNOSIS — J309 Allergic rhinitis, unspecified: Secondary | ICD-10-CM | POA: Diagnosis not present

## 2020-02-11 NOTE — Progress Notes (Signed)
VIALS EXP 02-11-21

## 2020-02-11 NOTE — Progress Notes (Signed)
ADDITIONAL LABELS

## 2020-02-14 DIAGNOSIS — J301 Allergic rhinitis due to pollen: Secondary | ICD-10-CM | POA: Diagnosis not present

## 2020-02-17 DIAGNOSIS — J3089 Other allergic rhinitis: Secondary | ICD-10-CM | POA: Diagnosis not present

## 2020-02-19 ENCOUNTER — Telehealth: Payer: Self-pay

## 2020-02-19 NOTE — Telephone Encounter (Signed)
PA FOR LIDODERM APPROVED THRU 02/18/21

## 2020-02-21 ENCOUNTER — Ambulatory Visit (INDEPENDENT_AMBULATORY_CARE_PROVIDER_SITE_OTHER): Payer: Medicaid Other

## 2020-02-21 DIAGNOSIS — J309 Allergic rhinitis, unspecified: Secondary | ICD-10-CM

## 2020-03-13 ENCOUNTER — Ambulatory Visit (INDEPENDENT_AMBULATORY_CARE_PROVIDER_SITE_OTHER): Payer: Medicaid Other | Admitting: *Deleted

## 2020-03-13 DIAGNOSIS — J309 Allergic rhinitis, unspecified: Secondary | ICD-10-CM | POA: Diagnosis not present

## 2020-03-20 ENCOUNTER — Other Ambulatory Visit: Payer: Self-pay | Admitting: Cardiology

## 2020-04-01 ENCOUNTER — Ambulatory Visit (INDEPENDENT_AMBULATORY_CARE_PROVIDER_SITE_OTHER): Payer: Medicaid Other | Admitting: *Deleted

## 2020-04-01 ENCOUNTER — Telehealth: Payer: Self-pay | Admitting: *Deleted

## 2020-04-01 DIAGNOSIS — J309 Allergic rhinitis, unspecified: Secondary | ICD-10-CM | POA: Diagnosis not present

## 2020-04-01 NOTE — Telephone Encounter (Signed)
Patient notified and will come in weekly.

## 2020-04-01 NOTE — Telephone Encounter (Signed)
Patient came in for his allergy shots and stated that he would prefer to come weekly for now since the Pollen season is bad he gets better relief coming weekly with his allergy shots instead of every 3 weeks. I told him that shouldn't be an issue but that I would check with the doctor and give him a call with an update. Please advise. Thank You.

## 2020-04-01 NOTE — Telephone Encounter (Signed)
It would be fine for Jason Stokes to come in every week for his immunotherapy.

## 2020-04-09 ENCOUNTER — Ambulatory Visit (INDEPENDENT_AMBULATORY_CARE_PROVIDER_SITE_OTHER): Payer: Medicaid Other | Admitting: *Deleted

## 2020-04-09 DIAGNOSIS — J309 Allergic rhinitis, unspecified: Secondary | ICD-10-CM | POA: Diagnosis not present

## 2020-04-13 ENCOUNTER — Ambulatory Visit: Payer: Self-pay

## 2020-04-13 ENCOUNTER — Ambulatory Visit (INDEPENDENT_AMBULATORY_CARE_PROVIDER_SITE_OTHER): Payer: Medicaid Other | Admitting: Orthopaedic Surgery

## 2020-04-13 DIAGNOSIS — M1711 Unilateral primary osteoarthritis, right knee: Secondary | ICD-10-CM

## 2020-04-13 DIAGNOSIS — M25561 Pain in right knee: Secondary | ICD-10-CM

## 2020-04-13 DIAGNOSIS — M25562 Pain in left knee: Secondary | ICD-10-CM | POA: Diagnosis not present

## 2020-04-13 DIAGNOSIS — M1712 Unilateral primary osteoarthritis, left knee: Secondary | ICD-10-CM | POA: Diagnosis not present

## 2020-04-13 DIAGNOSIS — M170 Bilateral primary osteoarthritis of knee: Secondary | ICD-10-CM

## 2020-04-13 MED ORDER — LIDOCAINE HCL 1 % IJ SOLN
3.0000 mL | INTRAMUSCULAR | Status: AC | PRN
  Administered 2020-04-13: 3 mL

## 2020-04-13 MED ORDER — METHYLPREDNISOLONE ACETATE 40 MG/ML IJ SUSP
40.0000 mg | INTRAMUSCULAR | Status: AC | PRN
  Administered 2020-04-13: 40 mg via INTRA_ARTICULAR

## 2020-04-13 NOTE — Progress Notes (Signed)
Office Visit Note   [name]           Date of Birth: [birth year]           MRN: [id number] Visit Date: 04/13/2020              Requested by: Charlott Rakes, MD [place],  [place] [address] PCP: Charlott Rakes, MD   Assessment & Plan: Visit Diagnoses:  1. Left knee pain, unspecified chronicity   2. Right knee pain, unspecified chronicity   3. Unilateral primary osteoarthritis, left knee   4. Unilateral primary osteoarthritis, right knee     Plan: I was able to aspirate 70 to 80 cc of clear fluid off of the left knee and only about 10 cc of fluid off the right knee.  I placed an injection of a steroid in both knees today.  He is a candidate for hyaluronic acid, however, Medicaid has not cover these injections in the past and we tried that for him before.  He will work on activity modification and quad strengthening exercises.  At some point he may be a candidate for knee replacement surgery but he wants to hold off on this until he gets to the point where the injections are not helping and the injections have certainly help before.  All question concerns were answered addressed.  Follow-up can be as needed.  Follow-Up Instructions: Return if symptoms worsen or fail to improve.   Orders:  Orders Placed This Encounter  Procedures  . Large Joint Inj  . Large Joint Inj  . XR Knee 1-2 Views Right  . XR Knee 1-2 Views Left   No orders of the defined types were placed in this encounter.     Procedures: Large Joint Inj: R knee on 04/13/2020 10:32 AM Indications: diagnostic evaluation and pain Details: 22 G 1.5 in needle, superolateral approach  Arthrogram: No  Medications: 3 mL lidocaine 1 %; 40 mg methylPREDNISolone acetate 40 MG/ML Outcome: tolerated well, no immediate complications Procedure, treatment alternatives, risks and benefits explained, specific risks discussed. Consent was given by the patient. Immediately prior to procedure a time out  was called to verify the correct patient, procedure, equipment, support staff and site/side marked as required. Patient was prepped and draped in the usual sterile fashion.   Large Joint Inj: L knee on 04/13/2020 10:32 AM Indications: diagnostic evaluation and pain Details: 22 G 1.5 in needle, superolateral approach  Arthrogram: No  Medications: 3 mL lidocaine 1 %; 40 mg methylPREDNISolone acetate 40 MG/ML Outcome: tolerated well, no immediate complications Procedure, treatment alternatives, risks and benefits explained, specific risks discussed. Consent was given by the patient. Immediately prior to procedure a time out was called to verify the correct patient, procedure, equipment, support staff and site/side marked as required. Patient was prepped and draped in the usual sterile fashion.       Clinical Data: No additional findings.   Subjective: Chief Complaint  Patient presents with  . Left Knee - Pain  . Right Knee - Pain  Patient is a very pleasant 59 year old gentleman I seen before.  He has chronic bilateral knee pain and has had aspirations and steroid injections on his knees in the past.  His right knee had an arthroscopic intervention that we did several years ago.  That knee has been hurting him worse.  He does walk with a walking cane.  He does have a history of strokes in the past.  He does  take Eliquis.  He is now diabetic.  He denies any acute change in medical status other than significant bilateral knee pain and swelling.  He denies any recent injuries.  HPI  Review of Systems Today he denies any headache, chest pain, shortness of breath, fever, chills, nausea, vomiting  Objective: Vital Signs: There were no vitals taken for this visit.  Physical Exam He is alert and oriented x3 and in no acute distress Ortho Exam Examination of his right knee shows significant varus malalignment.  Examination of the left knee shows more neutral alignment.  Both knees are warm  and slightly swollen with an effusion with painful range of motion.  The right knee hurts significantly along the medial joint line. Specialty Comments:  No specialty comments available.  Imaging: XR Knee 1-2 Views Left  Result Date: 04/13/2020 2 views of the left knee showed no acute findings.  There is moderate tricompartment arthritis.  XR Knee 1-2 Views Right  Result Date: 04/13/2020 2 views of the right knee show moderate to severe tricompartment arthritis mainly involving the medial joint space and the patellofemoral joint.  There is varus malalignment and significant medial joint space narrowing.    PMFS History: Patient Active Problem List   Diagnosis Date Noted  . Neck pain on left side 03/06/2019  . Musculoskeletal chest pain 07/24/2018  . Asthma, mild intermittent 05/02/2018  . Allergic rhinitis caused by mold 05/02/2018  . Tobacco use 05/02/2018  . Bilateral carpal tunnel syndrome 01/10/2018  . Stroke ([place])   . Schizophrenia ([place])   . Migraine   . History of nuclear stress test   . History of alcohol abuse   . GERD (gastroesophageal reflux disease)   . Dysrhythmia   . Chronic lower back pain   . Anxiety   . Arthritis of knee, right 04/12/2017  . Bipolar disorder ([place]) 04/03/2017  . Lipoma of forehead 09/22/2016  . Trigger ring finger of right hand 06/22/2016  . Paroxysmal atrial fibrillation (HCC)   . AF (atrial fibrillation) ([place]) 12/10/2015  . Eczema 07/10/2015  . History of CVA (cerebrovascular accident) 05/04/2015  . Tear of medial meniscus of right knee 03/18/2015  . Chronic pain of right knee 03/12/2015  . Other and unspecified hyperlipidemia 11/25/2013  . Nasal congestion 11/25/2013  . Sinusitis, chronic 05/09/2013  . Chronic low back pain 10/12/2012  . Lumbar radiculopathy 10/12/2012  . Hypertension 10/12/2012  . Depression 10/12/2012  . Insomnia 10/12/2012   Past Medical History:  Diagnosis Date  . Anxiety   . Bilateral carpal tunnel  syndrome 01/10/2018  . Bipolar disorder ([place])   . Chronic lower back pain   . Depression   . Dysrhythmia    a-fib  . GERD (gastroesophageal reflux disease)   . History of alcohol abuse   . History of nuclear stress test    Myoview 10/16: EF 50%, diaphragmatic attenuation, no ischemia, low risk  . Hypertension   . Migraine 2012-2014  . Moderate persistent asthma with acute exacerbation 05/02/2018  . PAF (paroxysmal atrial fibrillation) ([place]) 03/27/2015   a. Myoview neg for ischemia >> Flecainide started 10/16 >> FU ETT   . Schizophrenia ([place])   . Sciatica neuralgia   . Small vessel disease ([place])    Right basal ganglia stroke  . Stroke [facility]) "between 2012-2014"   residual "AF" (09/22/2015)    Family History  Problem Relation Age of Onset  . Heart disease Father   . Schizophrenia Sister     Past Surgical History:  Procedure Laterality Date  . ATRIAL FIBRILLATION ABLATION  09/22/2015  . CYST EXCISION  1996-97   surgery back of head   . ELECTROPHYSIOLOGIC STUDY N/A 09/22/2015   Procedure: Atrial Fibrillation Ablation;  Surgeon: Will [name], MD;  Location: [facility];  Service: Cardiovascular;  Laterality: N/A;  . ELECTROPHYSIOLOGIC STUDY N/A 12/10/2015   Procedure: Atrial Fibrillation Ablation;  Surgeon: Will [name], MD;  Location: [facility];  Service: Cardiovascular;  Laterality: N/A;  . ELECTROPHYSIOLOGIC STUDY N/A 12/11/2015   Procedure: Cardioversion;  Surgeon: Will [name], MD;  Location: [facility];  Service: Cardiovascular;  Laterality: N/A;  . EXCISION MASS HEAD N/A 01/06/2017   Procedure: EXCISION MASS FOREHEAD;  Surgeon: Irene Limbo, MD;  Location: [facility];  Service: Plastics;  Laterality: N/A;  . GANGLION CYST EXCISION Left   . INTERCOSTAL NERVE BLOCK  2005  . KNEE ARTHROSCOPY Right 2016   Social History   Occupational History    Comment: disabled  Tobacco Use  . Smoking status: Former Smoker    Years:  29.00  . Smokeless tobacco: Never Used  Vaping Use  . Vaping Use: Never used  Substance and Sexual Activity  . Alcohol use: Yes    Alcohol/week: 0.0 standard drinks    Comment: drinks social  . Drug use: Yes    Types: Marijuana    Comment: smoked marijuana 01-02-17 for pain  . Sexual activity: Not Currently

## 2020-04-17 ENCOUNTER — Ambulatory Visit (INDEPENDENT_AMBULATORY_CARE_PROVIDER_SITE_OTHER): Payer: Medicaid Other | Admitting: *Deleted

## 2020-04-17 DIAGNOSIS — J309 Allergic rhinitis, unspecified: Secondary | ICD-10-CM | POA: Diagnosis not present

## 2020-04-20 ENCOUNTER — Telehealth: Payer: Self-pay

## 2020-04-20 NOTE — Telephone Encounter (Signed)
Will route to PCP for review. 

## 2020-04-20 NOTE — Telephone Encounter (Signed)
Patient came into clinic to request a knee/ back brace. Patient would like to pick up the prescription.   Please advise [phone]

## 2020-04-21 NOTE — Telephone Encounter (Signed)
Which of his knees does he need a brace for?

## 2020-04-21 NOTE — Telephone Encounter (Signed)
Right knee.

## 2020-04-22 ENCOUNTER — Other Ambulatory Visit: Payer: Self-pay | Admitting: Family Medicine

## 2020-04-22 MED ORDER — MISC. DEVICES MISC: 0 refills | Status: DC

## 2020-04-22 NOTE — Telephone Encounter (Signed)
Requested Prescriptions  Pending Prescriptions Disp Refills   omeprazole (PRILOSEC) 20 MG capsule [Pharmacy Med Name: OMEPRAZOLE DR 20 MG CAPSULE] 90 capsule 2    Sig: TAKE 1 CAPSULE BY MOUTH EVERY DAY     Gastroenterology: Proton Pump Inhibitors Passed - 04/22/2020  6:21 AM      Passed - Valid encounter within last 12 months    Recent Outpatient Visits          3 months ago Muscle spasm   [place], En[place]ong, MD   9 months ago Screening for colon cancer   [place], Charlane Ferretti, MD   1 year ago Mild intermittent asthma without complication   [place] Elsie Stain, MD   1 year ago Acute recurrent maxillary sinusitis   Sharon, Patrick E, MD   1 year ago Allergic rhinitis caused by mold   [facility] Charlott Rakes, MD

## 2020-04-22 NOTE — Telephone Encounter (Signed)
Done

## 2020-04-22 NOTE — Telephone Encounter (Signed)
Patient has been informed and orders has been placed in box for pick up.

## 2020-04-24 ENCOUNTER — Ambulatory Visit (INDEPENDENT_AMBULATORY_CARE_PROVIDER_SITE_OTHER): Payer: Medicaid Other

## 2020-04-24 DIAGNOSIS — J309 Allergic rhinitis, unspecified: Secondary | ICD-10-CM | POA: Diagnosis not present

## 2020-05-04 ENCOUNTER — Other Ambulatory Visit: Payer: Self-pay

## 2020-05-04 ENCOUNTER — Emergency Department (HOSPITAL_COMMUNITY)
Admission: EM | Admit: 2020-05-04 | Discharge: 2020-05-04 | Disposition: A | Discharge status: Home / Self Care | Payer: Medicaid Other | Attending: Emergency Medicine | Admitting: Emergency Medicine

## 2020-05-04 ENCOUNTER — Ambulatory Visit (INDEPENDENT_AMBULATORY_CARE_PROVIDER_SITE_OTHER): Payer: Medicaid Other

## 2020-05-04 DIAGNOSIS — Z7951 Long term (current) use of inhaled steroids: Secondary | ICD-10-CM | POA: Insufficient documentation

## 2020-05-04 DIAGNOSIS — Z87891 Personal history of nicotine dependence: Secondary | ICD-10-CM | POA: Insufficient documentation

## 2020-05-04 DIAGNOSIS — J4541 Moderate persistent asthma with (acute) exacerbation: Secondary | ICD-10-CM | POA: Insufficient documentation

## 2020-05-04 DIAGNOSIS — J309 Allergic rhinitis, unspecified: Secondary | ICD-10-CM

## 2020-05-04 DIAGNOSIS — Z79899 Other long term (current) drug therapy: Secondary | ICD-10-CM | POA: Diagnosis not present

## 2020-05-04 DIAGNOSIS — I10 Essential (primary) hypertension: Secondary | ICD-10-CM | POA: Insufficient documentation

## 2020-05-04 DIAGNOSIS — Z7901 Long term (current) use of anticoagulants: Secondary | ICD-10-CM | POA: Diagnosis not present

## 2020-05-04 DIAGNOSIS — M5431 Sciatica, right side: Secondary | ICD-10-CM | POA: Insufficient documentation

## 2020-05-04 MED ORDER — PREDNISONE 50 MG PO TABS: 50.0000 mg | ORAL_TABLET | Freq: Every day | ORAL | 0 refills | Status: AC

## 2020-05-04 MED ORDER — CYCLOBENZAPRINE HCL 10 MG PO TABS: 10.0000 mg | ORAL_TABLET | Freq: Two times a day (BID) | ORAL | 0 refills | Status: DC | PRN

## 2020-05-04 MED ORDER — PREDNISONE 20 MG PO TABS
60.0000 mg | ORAL_TABLET | Freq: Once | ORAL | Status: AC
  Administered 2020-05-04: 60 mg via ORAL
  Filled 2020-05-04: qty 3

## 2020-05-04 MED ORDER — MORPHINE SULFATE (PF) 4 MG/ML IV SOLN
4.0000 mg | Freq: Once | INTRAVENOUS | Status: AC
  Administered 2020-05-04: 4 mg via INTRAMUSCULAR
  Filled 2020-05-04: qty 1

## 2020-05-04 MED ORDER — LIDOCAINE 5 % EX PTCH: 1.0000 | MEDICATED_PATCH | CUTANEOUS | 0 refills | Status: DC

## 2020-05-04 NOTE — Discharge Instructions (Signed)
You taking her home pain medication  If you have any additional Robaxin at home do not take it with a new prescription for Flexeril.  I have written you for steroids take as prescribed  Follow-up with PCP or neurosurgery for evaluation

## 2020-05-04 NOTE — ED Triage Notes (Signed)
Patient reports he is having a sciatic flare running down the right leg. Patient reports this has happened before.

## 2020-05-04 NOTE — ED Provider Notes (Signed)
[place] DEPT Provider Note   CSN: [id number] Arrival date & time: 05/04/20  1224     History Chief Complaint  Patient presents with  . Sciatica    Jason Stokes is a 59 y.o. male with past medical history significant for alcohol abuse, chronic pain, A. fib on anticoagulation, schizophrenia, sciatic neuralgia, anxiety, CVA who presents for evaluation of back pain.  Patient states it feels like a "flare" of his chronic back pain.  He takes Norco chronically and gabapentin.  No recent injury or trauma.  Will occasionally radiate down his right leg.  Located right gluteus into the posterior aspect.  He denies any history IV drug use, bowel or bladder incontinence, saddle paresthesia.  He has been ambulatory without difficulty.  Has had good resolution of pain with prednisone in the past.  No prior history of diabetes.  Denies fever, chills, nausea vomiting, chest pain, shortness of breath abdominal pain, diarrhea, dysuria, weakness, paresthesias.  Denies aggravating or alleviating factors.  Previously followed by neurosurgery Dr. Jannifer Franklin however previous provider has recently retired.  He is requesting referral to new neurosurgeon.  He is not sure what group this was with previously  He is followed by Dr. Ninfa Linden with orthopedics for chronic bilateral knee pain.  Walks with a cane at baseline  History obtained from patient and past medical records.  No interpreter used.  HPI     Past Medical History:  Diagnosis Date  . Anxiety   . Bilateral carpal tunnel syndrome 01/10/2018  . Bipolar disorder ([place])   . Chronic lower back pain   . Depression   . Dysrhythmia    a-fib  . GERD (gastroesophageal reflux disease)   . History of alcohol abuse   . History of nuclear stress test    Myoview 10/16: EF 50%, diaphragmatic attenuation, no ischemia, low risk  . Hypertension   . Migraine 2012-2014  . Moderate persistent asthma with acute exacerbation  05/02/2018  . PAF (paroxysmal atrial fibrillation) ([place]) 03/27/2015   a. Myoview neg for ischemia >> Flecainide started 10/16 >> FU ETT   . Schizophrenia ([place])   . Sciatica neuralgia   . Small vessel disease ([place])    Right basal ganglia stroke  . Stroke [facility]) "between 2012-2014"   residual "AF" (09/22/2015)    Patient Active Problem List   Diagnosis Date Noted  . Neck pain on left side 03/06/2019  . Musculoskeletal chest pain 07/24/2018  . Asthma, mild intermittent 05/02/2018  . Allergic rhinitis caused by mold 05/02/2018  . Tobacco use 05/02/2018  . Bilateral carpal tunnel syndrome 01/10/2018  . Stroke ([place])   . Schizophrenia ([place])   . Migraine   . History of nuclear stress test   . History of alcohol abuse   . GERD (gastroesophageal reflux disease)   . Dysrhythmia   . Chronic lower back pain   . Anxiety   . Arthritis of knee, right 04/12/2017  . Bipolar disorder ([place]) 04/03/2017  . Lipoma of forehead 09/22/2016  . Trigger ring finger of right hand 06/22/2016  . Paroxysmal atrial fibrillation ([facility])   . AF (atrial fibrillation) ([place]) 12/10/2015  . Eczema 07/10/2015  . History of CVA (cerebrovascular accident) 05/04/2015  . Tear of medial meniscus of right knee 03/18/2015  . Chronic pain of right knee 03/12/2015  . Other and unspecified hyperlipidemia 11/25/2013  . Nasal congestion 11/25/2013  . Sinusitis, chronic 05/09/2013  . Chronic low back pain 10/12/2012  . Lumbar radiculopathy 10/12/2012  .  Hypertension 10/12/2012  . Depression 10/12/2012  . Insomnia 10/12/2012    Past Surgical History:  Procedure Laterality Date  . ATRIAL FIBRILLATION ABLATION  09/22/2015  . CYST EXCISION  1996-97   surgery back of head   . ELECTROPHYSIOLOGIC STUDY N/A 09/22/2015   Procedure: Atrial Fibrillation Ablation;  Surgeon: Will [name], MD;  Location: [place] CV LAB;  Service: Cardiovascular;  Laterality: N/A;  . ELECTROPHYSIOLOGIC STUDY N/A 12/10/2015   Procedure: Atrial  Fibrillation Ablation;  Surgeon: Will [name], MD;  Location: [place] CV LAB;  Service: Cardiovascular;  Laterality: N/A;  . ELECTROPHYSIOLOGIC STUDY N/A 12/11/2015   Procedure: Cardioversion;  Surgeon: Will [name], MD;  Location: [place] CV LAB;  Service: Cardiovascular;  Laterality: N/A;  . EXCISION MASS HEAD N/A 01/06/2017   Procedure: EXCISION MASS FOREHEAD;  Surgeon: Irene Limbo, MD;  Location: [facility];  Service: Plastics;  Laterality: N/A;  . GANGLION CYST EXCISION Left   . INTERCOSTAL NERVE BLOCK  2005  . KNEE ARTHROSCOPY Right 2016       Family History  Problem Relation Age of Onset  . Heart disease Father   . Schizophrenia Sister     Social History   Tobacco Use  . Smoking status: Former Smoker    Years: 29.00  . Smokeless tobacco: Never Used  Vaping Use  . Vaping Use: Never used  Substance Use Topics  . Alcohol use: Yes    Alcohol/week: 0.0 standard drinks    Comment: drinks social  . Drug use: Yes    Types: Marijuana    Comment: smoked marijuana 01-02-17 for pain    Home Medications Prior to Admission medications   Medication Sig Start Date End Date Taking? Authorizing Provider  albuterol (PROAIR HFA) 108 (90 Base) MCG/ACT inhaler Inhale 2 puffs into the lungs every 4 (four) hours as needed for wheezing or shortness of breath. 10/29/19   Kozlow, Donnamarie Poag, MD  amoxicillin (AMOXIL) 500 MG capsule Take 1 capsule (500 mg total) by mouth 3 (three) times daily. 01/16/20   Charlott Rakes, MD  apixaban (ELIQUIS) 5 MG TABS tablet Take 1 tablet (5 mg total) by mouth 2 (two) times daily. 07/22/19   Camnitz, Ocie Doyne, MD  azelastine (ASTELIN) 0.1 % nasal spray Place 1 spray into both nostrils 2 (two) times daily. 11/04/19   Kozlow, Donnamarie Poag, MD  Azelastine HCl 0.15 % SOLN Place 1 spray into both nostrils 2 (two) times daily. 10/29/19   Kozlow, Donnamarie Poag, MD  carvedilol (COREG) 12.5 MG tablet Take 1 tablet (12.5 mg total) by mouth 2 (two)  times daily. 01/16/20   Charlott Rakes, MD  cetirizine (ZYRTEC) 10 MG tablet Take 1 tablet 1-2 times daily as needed. 10/29/19   Kozlow, Donnamarie Poag, MD  cyclobenzaprine (FLEXERIL) 10 MG tablet Take 1 tablet (10 mg total) by mouth 2 (two) times daily as needed for muscle spasms. 05/04/20   Micael Barb A, PA-C  CYMBALTA 30 MG capsule Take by mouth daily. 04/04/19   [provider]  diltiazem (CARDIZEM CD) 300 MG 24 hr capsule TAKE 1 CAPSULE BY MOUTH EVERY DAY 07/22/19   Camnitz, Ocie Doyne, MD  dronedarone (MULTAQ) 400 MG tablet Take 1 tablet (400 mg total) by mouth 2 (two) times daily with a meal. 02/21/18   Camnitz, Will Hassell Done, MD  fluticasone (FLONASE) 50 MCG/ACT nasal spray Place 1 spray into both nostrils 2 (two) times daily. 10/29/19   Kozlow, Donnamarie Poag, MD  fluticasone furoate-vilanterol (  BREO ELLIPTA) 200-25 MCG/INH AEPB Inhale 1 puff into the lungs daily. 10/29/19   Kozlow, Donnamarie Poag, MD  Fluticasone-Salmeterol (ADVAIR) 500-50 MCG/DOSE AEPB Inhale 1 puff into the lungs daily at 12 noon. 10/31/19   Kozlow, Donnamarie Poag, MD  hydrALAZINE (APRESOLINE) 100 MG tablet TAKE 1 TABLET (100 MG TOTAL) BY MOUTH 3 (THREE) TIMES DAILY. 03/20/20   Camnitz, Will Hassell Done, MD  lidocaine (LIDODERM) 5 % Place 1 patch onto the skin daily. Remove & Discard patch within 12 hours or as directed by MD 05/04/20   Matisse Roskelley A, PA-C  methocarbamol (ROBAXIN) 500 MG tablet Take 1 tablet (500 mg total) by mouth every 8 (eight) hours as needed for muscle spasms. 01/14/20   Suzzanne Cloud, NP  Misc. Devices MISC Right knee brace. Diagnosis R knee pain 04/22/20   Charlott Rakes, MD  Misc. Devices MISC Back brace. Diagnosis chronic back pain 04/22/20   Charlott Rakes, MD  montelukast (SINGULAIR) 10 MG tablet Take 1 tablet (10 mg total) by mouth at bedtime. 10/29/19   Kozlow, Donnamarie Poag, MD  Olopatadine HCl (PATADAY) 0.2 % SOLN Use one drop in each eye once daily as needed. 10/29/19   Kozlow, Donnamarie Poag, MD  Olopatadine HCl (PAZEO) 0.7 % SOLN  Place 1 drop into both eyes 2 (two) times a day. 01/01/19   Elsie Stain, MD  omeprazole (PRILOSEC) 20 MG capsule TAKE 1 CAPSULE BY MOUTH EVERY DAY 04/22/20   Charlott Rakes, MD  Oxycodone HCl 10 MG TABS TAKE 1 (ONE) TABLET FOUR TIMES DAILY, AS NEEDED 12/07/18   [provider]  prazosin (MINIPRESS) 1 MG capsule TAKE 1 CAPSULE BY MOUTH EVERYDAY AT BEDTIME 09/04/17   [provider]  predniSONE (DELTASONE) 50 MG tablet Take 1 tablet (50 mg total) by mouth daily for 5 days. 05/04/20 05/09/20  Brailey Buescher A, PA-C  pregabalin (LYRICA) 75 MG capsule Take 1 capsule by mouth 4 (four) times daily as needed. 09/17/18   [provider]    Allergies    Lisinopril  Review of Systems   Review of Systems  Constitutional: Negative.   HENT: Negative.   Respiratory: Negative.   Cardiovascular: Negative.   Gastrointestinal: Negative.   Genitourinary: Negative.   Musculoskeletal: Positive for back pain. Negative for arthralgias, gait problem, joint swelling, myalgias, neck pain and neck stiffness.  Skin: Negative.   Neurological: Negative.   All other systems reviewed and are negative.   Physical Exam Updated Vital Signs BP 132/82 (BP Location: Right Arm)   Pulse 78   Temp 97.6 F (36.4 C) (Oral)   Resp 18   Ht 6\' 1"  (1.854 m)   Wt 121.6 kg   SpO2 97%   BMI 35.36 kg/m   Physical Exam Physical Exam  Constitutional: Pt appears well-developed and well-nourished. No distress.  HENT:  Head: Normocephalic and atraumatic.  Mouth/Throat: Oropharynx is clear and moist. No oropharyngeal exudate.  Eyes: Conjunctivae are normal.  Neck: Normal range of motion. Neck supple.  Full ROM without pain  Cardiovascular: Normal rate, regular rhythm and intact distal pulses.  2+ BL pulses to radial, DP pulses bilaterally. Pulmonary/Chest: Effort normal and breath sounds normal. No respiratory distress. Pt has no wheezes.  Abdominal: Soft. Pt exhibits no distension. There is no  tenderness, rebound or guarding. No abd bruit or pulsatile mass. Musculoskeletal:  Full range of motion of the T-spine and L-spine with flexion, hyperextension, and lateral flexion. No midline tenderness or stepoffs. No tenderness to palpation of the  spinous processes of the T-spine or L-spine. Moderate tenderness to palpation of the paraspinous muscles of the L-spine on RIGHT. Positive straight leg raise on right at 40".  Moderate tenderness palpation over right piriformis and into SI region.  No overlying skin changes. Lymphadenopathy:    Pt has no cervical adenopathy.  Neurological: Pt is alert. Pt has normal reflexes.  Reflex Scores:      Bicep reflexes are 2+ on the right side and 2+ on the left side.      Brachioradialis reflexes are 2+ on the right side and 2+ on the left side.      Patellar reflexes are 2+ on the right side and 2+ on the left side.      Achilles reflexes are 2+ on the right side and 2+ on the left side. Speech is clear and goal oriented, follows commands Normal 5/5 strength in upper and lower extremities bilaterally including dorsiflexion and plantar flexion, strong and equal grip strength Sensation normal to light and sharp touch Moves extremities without ataxia, coordination intact Normal gait>> walks with a walker at baseline Normal balance No Clonus Skin: Skin is warm and dry. No rash noted or lesions noted. Pt is not diaphoretic. No erythema, ecchymosis or warmth.  1+ pitting edema to BLE. Psychiatric: Pt has a normal mood and affect. Behavior is normal.  Nursing note and vitals reviewed. ED Results / Procedures / Treatments   Labs (all labs ordered are listed, but only abnormal results are displayed) Labs Reviewed - No data to display  EKG None  Radiology No results found.  Procedures Procedures (including critical care time)  Medications Ordered in ED Medications  morphine 4 MG/ML injection 4 mg (4 mg Intramuscular Given 05/04/20 1356)    predniSONE (DELTASONE) tablet 60 mg (60 mg Oral Given 05/04/20 1355)   ED Course  I have reviewed the triage vital signs and the nursing notes.  Pertinent labs & imaging results that were available during my care of the patient were reviewed by me and considered in my medical decision making (see chart for details).  59 year old presents for evaluation of acute on chronic back pain.  No recent injury or trauma.  States it feels like a "flare" of his chronic back pain.  Has radicular symptoms down the right.  No history IV drug use, bowel or bladder incontinence, saddle paresthesia.  She ambulates with a cane at baseline.  Was previously seen by Dr. Jannifer Franklin with neurosurgery however has not been evaluated as his provider recently retired.  He is followed by Dr. Ninfa Linden with orthopedics for bilateral, chronic knee pain.  He is on chronic opioid management.  Exam today consistent with sciatica with positive straight leg raise and I am able to reproduce his pain.  He is not diabetic.  Will start on steroids.  Was given single dose of pain medicine here.  Discussed follow-up with neurosurgery versus orthopedics for his acute on chronic back pain.  I have written for a short steroid burst.  Has lidocaine patches, muscle relaxers.  He is ready on heavy dose of chronic pain medicine I do not feel we should increase at this time.  Discussed close follow-up which she is agreeable to.  I have low suspicion for acute neurosurgical emergency such as cauda equina, discitis, osteomyelitis, transverse myelitis, psoas abscess, bacterial infectious process, VTE.  His abdomen is soft, nontender.  No midline pulsatile abdominal mass, flank pain I have low suspicion for acute AAA, dissection or additional intra-abdominal process  as cause of his back pain.  The patient has been appropriately medically screened and/or stabilized in the ED. I have low suspicion for any other emergent medical condition which would require  further screening, evaluation or treatment in the ED or require inpatient management.  Patient is hemodynamically stable and in no acute distress.  Patient able to ambulate in department prior to ED.  Evaluation does not show acute pathology that would require ongoing or additional emergent interventions while in the emergency department or further inpatient treatment.  I have discussed the diagnosis with the patient and answered all questions.  Pain is been managed while in the emergency department and patient has no further complaints prior to discharge.  Patient is comfortable with plan discussed in room and is stable for discharge at this time.  I have discussed strict return precautions for returning to the emergency department.  Patient was encouraged to follow-up with PCP/specialist refer to at discharge.    MDM Rules/Calculators/A&P                           Final Clinical Impression(s) / ED Diagnoses Final diagnoses:  Sciatica of right side    Rx / DC Orders ED Discharge Orders         Ordered    predniSONE (DELTASONE) 50 MG tablet  Daily        05/04/20 1410    cyclobenzaprine (FLEXERIL) 10 MG tablet  2 times daily PRN        05/04/20 1410    lidocaine (LIDODERM) 5 %  Every 24 hours        05/04/20 1410           Traevon Meiring A, PA-C 05/04/20 1523    Drenda Freeze, MD 05/05/20 1505

## 2020-05-06 ENCOUNTER — Telehealth: Payer: Self-pay | Admitting: Family Medicine

## 2020-05-06 DIAGNOSIS — M5416 Radiculopathy, lumbar region: Secondary | ICD-10-CM

## 2020-05-06 NOTE — Telephone Encounter (Signed)
Copied from [place] [id number]. Topic: General - Other >> May 06, 2020 12:14 PM Keene Breath wrote: Reason for CRM: Patient would like the nurse or doctor to call him regarding referral to a neuro surgeon for his back.  He states that he is almost out of medication and there was not an appt. Soon for him to be seen.  Please call to discuss at [phone]

## 2020-05-07 NOTE — Telephone Encounter (Signed)
Patient was seen in ED on 05/04/2020 for back pain and now requesting referral.

## 2020-05-07 NOTE — Telephone Encounter (Signed)
Referral has been placed. 

## 2020-05-08 ENCOUNTER — Telehealth: Payer: Self-pay | Admitting: Family Medicine

## 2020-05-08 NOTE — Telephone Encounter (Signed)
Thank you :)

## 2020-05-08 NOTE — Telephone Encounter (Signed)
Patient called to request a PA for a knee and back brace.  Please call patient to discuss at [phone]

## 2020-05-12 NOTE — Telephone Encounter (Signed)
He asked for prescription for right knee brace and back brace and I wrote the prescription for him.  I am not sure what he means by a prior authorization.  We typically do that for medications.  He would need to obtain more information regarding this.

## 2020-05-14 ENCOUNTER — Ambulatory Visit (INDEPENDENT_AMBULATORY_CARE_PROVIDER_SITE_OTHER): Payer: Medicaid Other

## 2020-05-14 DIAGNOSIS — J309 Allergic rhinitis, unspecified: Secondary | ICD-10-CM | POA: Diagnosis not present

## 2020-05-20 NOTE — Progress Notes (Signed)
Cardiology Office Note Date:  05/20/2020  Patient ID:  Jason Stokes, Jason Stokes February 05, 1961, MRN [id number] PCP:  Charlott Rakes, MD  Cardiologist:  Dr. Radford Pax Electrophysiologist: Dr. Curt Bears    Chief Complaint:  60mo f/u  History of Present Illness: Jason Stokes is a 59 y.o. male with history of HTN, stroke, bipolar disorder, schizophrenia, migraine HA, AFib.  He comes today to be seen for Dr. Curt Bears, last seen by him may 2021.  He was doing well, had a3 episodes of AF all short lived, about 10 minutes or less and was happy with his management.  There was mention of h/o syncope that had not been recurrent.  No changes were made, he was looking forward to a trip to [place].  TODAY He is doing well from a cardiac standpoint He says his Afib is abut the same, infrequent and brief episodes, continues to be happy with his current regime. No bleeding or signs of bleeding He is largely limited by orthopedic issues, sciatica pain, knee pain especially. He denies any CP, SOB or DOE He has never fainted again  His PMD manages his lipids, labs, is due to see her soon.   AFib Hx Diagnosed 2016 PVI ablation scheduled for 09/22/2015 noted with to have LA clot and the procedure was aborted >> performed on 12/10/2015  AAD Flecainide > intolerant with increased palpitations and sweating, ETT noted PVCs and NSVT, stopped 2016 Amiodarone march 2017 >> stopped 03/14/16 post ablation Multaq started 02/2018 >>> is current  Past Medical History:  Diagnosis Date  . Anxiety   . Bilateral carpal tunnel syndrome 01/10/2018  . Bipolar disorder ([place])   . Chronic lower back pain   . Depression   . Dysrhythmia    a-fib  . GERD (gastroesophageal reflux disease)   . History of alcohol abuse   . History of nuclear stress test    Myoview 10/16: EF 50%, diaphragmatic attenuation, no ischemia, low risk  . Hypertension   . Migraine 2012-2014  . Moderate persistent asthma with acute exacerbation 05/02/2018  .  PAF (paroxysmal atrial fibrillation) ([place]) 03/27/2015   a. Myoview neg for ischemia >> Flecainide started 10/16 >> FU ETT   . Schizophrenia ([place])   . Sciatica neuralgia   . Small vessel disease ([place])    Right basal ganglia stroke  . Stroke [facility]) "between 2012-2014"   residual "AF" (09/22/2015)    Past Surgical History:  Procedure Laterality Date  . ATRIAL FIBRILLATION ABLATION  09/22/2015  . CYST EXCISION  1996-97   surgery back of head   . ELECTROPHYSIOLOGIC STUDY N/A 09/22/2015   Procedure: Atrial Fibrillation Ablation;  Surgeon: Will [name], MD;  Location: [facility];  Service: Cardiovascular;  Laterality: N/A;  . ELECTROPHYSIOLOGIC STUDY N/A 12/10/2015   Procedure: Atrial Fibrillation Ablation;  Surgeon: Will [name], MD;  Location: [facility];  Service: Cardiovascular;  Laterality: N/A;  . ELECTROPHYSIOLOGIC STUDY N/A 12/11/2015   Procedure: Cardioversion;  Surgeon: Will [name], MD;  Location: [facility];  Service: Cardiovascular;  Laterality: N/A;  . EXCISION MASS HEAD N/A 01/06/2017   Procedure: EXCISION MASS FOREHEAD;  Surgeon: Irene Limbo, MD;  Location: [facility];  Service: Plastics;  Laterality: N/A;  . GANGLION CYST EXCISION Left   . INTERCOSTAL NERVE BLOCK  2005  . KNEE ARTHROSCOPY Right 2016    Current Outpatient Medications  Medication Sig Dispense Refill  . albuterol (PROAIR HFA) 108 (90 Base) MCG/ACT inhaler Inhale  2 puffs into the lungs every 4 (four) hours as needed for wheezing or shortness of breath. 18 g 2  . amoxicillin (AMOXIL) 500 MG capsule Take 1 capsule (500 mg total) by mouth 3 (three) times daily. 30 capsule 0  . apixaban (ELIQUIS) 5 MG TABS tablet Take 1 tablet (5 mg total) by mouth 2 (two) times daily. 60 tablet 5  . azelastine (ASTELIN) 0.1 % nasal spray Place 1 spray into both nostrils 2 (two) times daily. 30 mL 5  . Azelastine HCl 0.15 % SOLN Place 1 spray into both nostrils 2 (two) times  daily. 30 mL 5  . carvedilol (COREG) 12.5 MG tablet Take 1 tablet (12.5 mg total) by mouth 2 (two) times daily. 60 tablet 3  . cetirizine (ZYRTEC) 10 MG tablet Take 1 tablet 1-2 times daily as needed. 60 tablet 5  . cyclobenzaprine (FLEXERIL) 10 MG tablet Take 1 tablet (10 mg total) by mouth 2 (two) times daily as needed for muscle spasms. 20 tablet 0  . CYMBALTA 30 MG capsule Take by mouth daily.    Marland Kitchen diltiazem (CARDIZEM CD) 300 MG 24 hr capsule TAKE 1 CAPSULE BY MOUTH EVERY DAY 90 capsule 3  . dronedarone (MULTAQ) 400 MG tablet Take 1 tablet (400 mg total) by mouth 2 (two) times daily with a meal. 60 tablet 6  . fluticasone (FLONASE) 50 MCG/ACT nasal spray Place 1 spray into both nostrils 2 (two) times daily. 16 g 5  . fluticasone furoate-vilanterol (BREO ELLIPTA) 200-25 MCG/INH AEPB Inhale 1 puff into the lungs daily. 60 each 2  . Fluticasone-Salmeterol (ADVAIR) 500-50 MCG/DOSE AEPB Inhale 1 puff into the lungs daily at 12 noon. 60 each 5  . hydrALAZINE (APRESOLINE) 100 MG tablet TAKE 1 TABLET (100 MG TOTAL) BY MOUTH 3 (THREE) TIMES DAILY. 270 tablet 2  . lidocaine (LIDODERM) 5 % Place 1 patch onto the skin daily. Remove & Discard patch within 12 hours or as directed by MD 30 patch 0  . methocarbamol (ROBAXIN) 500 MG tablet Take 1 tablet (500 mg total) by mouth every 8 (eight) hours as needed for muscle spasms. 30 tablet 1  . Misc. Devices MISC Right knee brace. Diagnosis R knee pain 1 each 0  . Misc. Devices MISC Back brace. Diagnosis chronic back pain 1 each 0  . montelukast (SINGULAIR) 10 MG tablet Take 1 tablet (10 mg total) by mouth at bedtime. 30 tablet 5  . Olopatadine HCl (PATADAY) 0.2 % SOLN Use one drop in each eye once daily as needed. 2.5 mL 5  . Olopatadine HCl (PAZEO) 0.7 % SOLN Place 1 drop into both eyes 2 (two) times a day. 1 Bottle 5  . omeprazole (PRILOSEC) 20 MG capsule TAKE 1 CAPSULE BY MOUTH EVERY DAY 90 capsule 2  . Oxycodone HCl 10 MG TABS TAKE 1 (ONE) TABLET FOUR TIMES  DAILY, AS NEEDED    . prazosin (MINIPRESS) 1 MG capsule TAKE 1 CAPSULE BY MOUTH EVERYDAY AT BEDTIME  2  . pregabalin (LYRICA) 75 MG capsule Take 1 capsule by mouth 4 (four) times daily as needed.     No current facility-administered medications for this visit.    Allergies:   Lisinopril   Social History:  The patient  reports that he has quit smoking. He quit after 29.00 years of use. He has never used smokeless tobacco. He reports current alcohol use. He reports current drug use. Drug: Marijuana.   Family History:  The patient's family history includes Heart disease  in his father; Schizophrenia in his sister.  ROS:  Please see the history of present illness.    All other systems are reviewed and otherwise negative.   PHYSICAL EXAM:  VS:  There were no vitals taken for this visit. BMI: There is no height or weight on file to calculate BMI. Well nourished, well developed, in no acute distress HEENT: normocephalic, atraumatic Neck: no JVD, carotid bruits or masses Cardiac:  RRR; no significant murmurs, no rubs, or gallops Lungs:  CTA b/l, no wheezing, rhonchi or rales Abd: soft, nontender MS: no deformity or atrophy Ext: no edema Skin: warm and dry, no rash Neuro:  No gross deficits appreciated Psych: euthymic mood, full affect   EKG:  Done today and reviewed by myself ST 101bpm, normal othewise   12/11/2015: DCCV CONCLUSIONS: 1. Sinus rhythm upon presentation.   2. Successful electrical isolation and anatomical encircling of all four pulmonary veins with radiofrequency current. 3. No inducible arrhythmias following ablation both on and off of Isuprel 4. No early apparent complications   12/02/6158: EPS/ablation CONCLUSIONS: 1. Sinus rhythm upon presentation.   2. Successful electrical isolation and anatomical encircling of all four pulmonary veins with radiofrequency current. 3. No inducible arrhythmias following ablation both on and off of Isuprel 4. No early apparent  complications    01/17/7105: CONCLUSIONS: 1. Atrial fibrillation upon presentation.   2. Blood clot found on transseptal sheath in the left atrium.  Procedure canceled and CT performed showing no acute clot.    05/15/15 SPECT  The left ventricular ejection fraction is mildly decreased (45-54%).  Nuclear stress EF: 50%.  There was no ST segment deviation noted during stress.  Defect 1: Moderate sized, mild in intensity, fixed defect in the mid and basal inferior and basal inferolateral wall consistent with diaphragmatic attenuation. No ischemia noted.  This is a low risk study.  TTE 03/31/15 - Normal LV systolic function; grade 1 diastolic dysfunction; mildLAE.  ETT 01/26/17 - personally reviewed  Blood pressure demonstrated a normal response to exercise.  There was no ST segment deviation noted during stress.  Negative study for exercise induced ischemia.  There were frequent PVCs, ventricular couplets and ventricular salvos.  Recent Labs: 01/03/2020: ALT 14; BUN 9; Creatinine, Ser 1.17; Hemoglobin 14.2; Platelets 215; Potassium 3.6; Sodium 138  No results found for requested labs within last 8760 hours.   CrCl cannot be calculated (Patient's most recent lab result is older than the maximum 21 days allowed.).   Wt Readings from Last 3 Encounters:  05/04/20 268 lb (121.6 kg)  01/16/20 253 lb (114.8 kg)  01/14/20 258 lb (117 kg)     Other studies reviewed: Additional studies/records reviewed today include: summarized above  ASSESSMENT AND PLAN:  1. Paroxysmal Afib     CHA2DS2Vasc is 3, on Eliquis, appropriately dosed     Maintained on Multaq, good control  2. HTN     A bithigh today, he mentions his back/sciatica is really bothering him today     Asked to keep an eye on it   Disposition: F/u with [place] in 84mo, sooner if needed  Current medicines are reviewed at length with the patient today.  The patient did not have any concerns regarding  medicines.  Venetia Night, PA-C 05/20/2020 12:57 PM     [place]  [id number] [phone] (office)  [phone] (fax)

## 2020-05-21 ENCOUNTER — Ambulatory Visit (INDEPENDENT_AMBULATORY_CARE_PROVIDER_SITE_OTHER): Payer: Medicaid Other | Admitting: Physician Assistant

## 2020-05-21 ENCOUNTER — Encounter: Payer: Self-pay | Admitting: Physician Assistant

## 2020-05-21 ENCOUNTER — Telehealth: Payer: Self-pay | Admitting: Family Medicine

## 2020-05-21 ENCOUNTER — Ambulatory Visit: Payer: Medicaid Other | Admitting: Family

## 2020-05-21 ENCOUNTER — Other Ambulatory Visit: Payer: Self-pay

## 2020-05-21 VITALS — BP 146/86 | HR 101 | Ht 73.0 in | Wt 258.0 lb

## 2020-05-21 DIAGNOSIS — I10 Essential (primary) hypertension: Secondary | ICD-10-CM

## 2020-05-21 DIAGNOSIS — I480 Paroxysmal atrial fibrillation: Secondary | ICD-10-CM | POA: Diagnosis not present

## 2020-05-21 MED ORDER — DRONEDARONE HCL 400 MG PO TABS
400.0000 mg | ORAL_TABLET | Freq: Two times a day (BID) | ORAL | 3 refills | Status: DC
Start: 2020-05-21 — End: 2021-03-23

## 2020-05-21 MED ORDER — APIXABAN 5 MG PO TABS: 5.0000 mg | ORAL_TABLET | Freq: Two times a day (BID) | ORAL | 6 refills | Status: DC

## 2020-05-21 NOTE — Telephone Encounter (Signed)
PT is in need of a back brace and a knee brace for intense pain. Please advise.

## 2020-05-21 NOTE — Patient Instructions (Signed)
Medication Instructions:  Your physician recommends that you continue on your current medications as directed. Please refer to the Current Medication list given to you today.  *If you need a refill on your cardiac medications before your next appointment, please call your pharmacy*   Lab Work: NONE ORDERED  TODAY   If you have labs (blood work) drawn today and your tests are completely normal, you will receive your results only by: . MyChart Message (if you have MyChart) OR . A paper copy in the mail If you have any lab test that is abnormal or we need to change your treatment, we will call you to review the results.   Testing/Procedures: NONE ORDERED  TODAY   Follow-Up: At [facility], you and your health needs are our priority.  As part of our continuing mission to provide you with exceptional heart care, we have created designated Provider Care Teams.  These Care Teams include your primary Cardiologist (physician) and Advanced Practice Providers (APPs -  Physician Assistants and Nurse Practitioners) who all work together to provide you with the care you need, when you need it.  We recommend signing up for the patient portal called "MyChart".  Sign up information is provided on this After Visit Summary.  MyChart is used to connect with patients for Virtual Visits (Telemedicine).  Patients are able to view lab/test results, encounter notes, upcoming appointments, etc.  Non-urgent messages can be sent to your provider as well.   To learn more about what you can do with MyChart, go to [email or web address].    Your next appointment:   6 month(s)  The format for your next appointment:   In Person  Provider:   You may see Will Martin Camnitz, MD or one of the following Advanced Practice Providers on your designated Care Team:    Amber Seiler, NP  Renee Ursuy, PA-C  Michael "Andy" Tillery, PA-C    Other Instructions   

## 2020-05-22 ENCOUNTER — Telehealth: Payer: Self-pay

## 2020-05-22 NOTE — Telephone Encounter (Signed)
**Note De-Identified  Obfuscation** I started a Multaq PA through covermymeds: Key: [id number]

## 2020-05-26 NOTE — Telephone Encounter (Signed)
Patient has an up coming appointment.

## 2020-05-27 NOTE — Telephone Encounter (Signed)
Call placed to patient # [phone]  this morning to clarify his request for prior authorization for his braces. Message left with call back requested to this CM   Call placed to [employer], spoke to [place] who stated that they have no record of the patient.  She would have a representative from the retail store contact this CM.  Message received from [place]. He stated that they do not have any information on the patient and knee and back braces are generally provate pay.   Call placed to [facility] # [phone], spoke to [facility].  She said that medicaid may pay for some braces.  The patient would need to come to their clinic to be assessed for the most appropriate brace.   They would request any prior authorization if necessary.

## 2020-05-28 ENCOUNTER — Ambulatory Visit: Payer: Medicaid Other | Attending: Family Medicine | Admitting: Family Medicine

## 2020-05-28 ENCOUNTER — Other Ambulatory Visit: Payer: Self-pay

## 2020-05-28 DIAGNOSIS — G8929 Other chronic pain: Secondary | ICD-10-CM

## 2020-05-28 DIAGNOSIS — J328 Other chronic sinusitis: Secondary | ICD-10-CM | POA: Diagnosis not present

## 2020-05-28 DIAGNOSIS — I723 Aneurysm of iliac artery: Secondary | ICD-10-CM | POA: Insufficient documentation

## 2020-05-28 DIAGNOSIS — M25562 Pain in left knee: Secondary | ICD-10-CM

## 2020-05-28 MED ORDER — PREDNISONE 20 MG PO TABS: 20.0000 mg | ORAL_TABLET | Freq: Two times a day (BID) | ORAL | 0 refills | Status: DC

## 2020-05-28 MED ORDER — MISC. DEVICES MISC: 0 refills | Status: DC

## 2020-05-28 NOTE — Progress Notes (Signed)
Having trouble with sinus.  

## 2020-05-28 NOTE — Progress Notes (Signed)
Virtual Visit via Telephone Note  I connected with Jason Stokes, on 05/28/2020 at 8:46 AM by telephone due to the COVID-19 pandemic and verified that I am speaking with the correct person using two identifiers.   Consent: I discussed the limitations, risks, security and privacy concerns of performing an evaluation and management service by telephone and the availability of in person appointments. I also discussed with the patient that there may be a patient responsible charge related to this service. The patient expressed understanding and agreed to proceed.   Location of Patient: Home  Location of Provider: Clinic   Persons participating in Telemedicine visit: Mark Hassey Farrington-CMA Dr. Margarita Rana     History of Present Illness: Jason Stokes is a 59 year old male with a history of hypertension, type 2 diabetes mellitus (A1c 5.7), bipolar disorder, paroxysmal A. 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 He informs me that his sinuses are 'flaring up'.  He is currently on Zyrtec, azelastine nasal spray and immunotherapy and is under the care of allergy and immunology.  He is scheduled for his next immunotherapy injection tomorrow.  He endorses nasal congestion and postnasal drip but no myalgias, fever or facial pain.  He last received amoxicillin for acute on chronic sinus infection last week. He is requesting a L knee brace; he is s/p arthrocentesis and his left knee hurts. He had requested a back brace prescription of which I have previously recommended also requested a right knee brace previously but today he informs me he wants a left knee brace.  Past Medical History:  Diagnosis Date  . Anxiety   . Bilateral carpal tunnel syndrome 01/10/2018  . Bipolar disorder ([place])   . Chronic lower back pain   . Depression   . Dysrhythmia    a-fib  . GERD  (gastroesophageal reflux disease)   . History of alcohol abuse   . History of nuclear stress test    Myoview 10/16: EF 50%, diaphragmatic attenuation, no ischemia, low risk  . Hypertension   . Migraine 2012-2014  . Moderate persistent asthma with acute exacerbation 05/02/2018  . PAF (paroxysmal atrial fibrillation) ([place]) 03/27/2015   a. Myoview neg for ischemia >> Flecainide started 10/16 >> FU ETT   . Schizophrenia ([place])   . Sciatica neuralgia   . Small vessel disease ([place])    Right basal ganglia stroke  . Stroke [facility]) "between 2012-2014"   residual "AF" (09/22/2015)   Allergies  Allergen Reactions  . Lisinopril Anaphylaxis    Swelling of lips and tongue.    Current Outpatient Medications on File Prior to Visit  Medication Sig Dispense Refill  . albuterol (PROAIR HFA) 108 (90 Base) MCG/ACT inhaler Inhale 2 puffs into the lungs every 4 (four) hours as needed for wheezing or shortness of breath. 18 g 2  . amoxicillin (AMOXIL) 500 MG capsule Take 1 capsule (500 mg total) by mouth 3 (three) times daily. 30 capsule 0  . apixaban (ELIQUIS) 5 MG TABS tablet Take 1 tablet (5 mg total) by mouth 2 (two) times daily. 60 tablet 6  . azelastine (ASTELIN) 0.1 % nasal spray Place 1 spray into both nostrils 2 (two) times daily. 30 mL 5  . Azelastine HCl 0.15 % SOLN Place 1 spray into both nostrils 2 (two) times daily. 30 mL 5  . baclofen (LIORESAL) 10 MG tablet Take 10 mg by mouth 3 (three) times daily as  needed for muscle pain.    . carvedilol (COREG) 12.5 MG tablet Take 1 tablet (12.5 mg total) by mouth 2 (two) times daily. 60 tablet 3  . cetirizine (ZYRTEC) 10 MG tablet Take 1 tablet 1-2 times daily as needed. 60 tablet 5  . cyclobenzaprine (FLEXERIL) 10 MG tablet Take 1 tablet (10 mg total) by mouth 2 (two) times daily as needed for muscle spasms. 20 tablet 0  . CYMBALTA 30 MG capsule Take by mouth daily.    Marland Kitchen diltiazem (CARDIZEM CD) 300 MG 24 hr capsule TAKE 1 CAPSULE BY MOUTH EVERY DAY 90  capsule 3  . dronedarone (MULTAQ) 400 MG tablet Take 1 tablet (400 mg total) by mouth 2 (two) times daily with a meal. 180 tablet 3  . fluticasone (FLONASE) 50 MCG/ACT nasal spray Place 1 spray into both nostrils 2 (two) times daily. 16 g 5  . fluticasone furoate-vilanterol (BREO ELLIPTA) 200-25 MCG/INH AEPB Inhale 1 puff into the lungs daily. 60 each 2  . Fluticasone-Salmeterol (ADVAIR) 500-50 MCG/DOSE AEPB Inhale 1 puff into the lungs daily at 12 noon. 60 each 5  . hydrALAZINE (APRESOLINE) 100 MG tablet TAKE 1 TABLET (100 MG TOTAL) BY MOUTH 3 (THREE) TIMES DAILY. 270 tablet 2  . lidocaine (LIDODERM) 5 % Place 1 patch onto the skin daily. Remove & Discard patch within 12 hours or as directed by MD 30 patch 0  . methocarbamol (ROBAXIN) 500 MG tablet Take 1 tablet (500 mg total) by mouth every 8 (eight) hours as needed for muscle spasms. 30 tablet 1  . Misc. Devices MISC Right knee brace. Diagnosis R knee pain 1 each 0  . Misc. Devices MISC Back brace. Diagnosis chronic back pain 1 each 0  . montelukast (SINGULAIR) 10 MG tablet Take 1 tablet (10 mg total) by mouth at bedtime. 30 tablet 5  . Olopatadine HCl (PATADAY) 0.2 % SOLN Use one drop in each eye once daily as needed. 2.5 mL 5  . Olopatadine HCl (PAZEO) 0.7 % SOLN Place 1 drop into both eyes 2 (two) times a day. 1 Bottle 5  . omeprazole (PRILOSEC) 20 MG capsule TAKE 1 CAPSULE BY MOUTH EVERY DAY 90 capsule 2  . Oxycodone HCl 10 MG TABS TAKE 1 (ONE) TABLET FOUR TIMES DAILY, AS NEEDED    . prazosin (MINIPRESS) 1 MG capsule TAKE 1 CAPSULE BY MOUTH EVERYDAY AT BEDTIME  2  . pregabalin (LYRICA) 75 MG capsule Take 1 capsule by mouth 4 (four) times daily as needed.     No current facility-administered medications on file prior to visit.    Observations/Objective: Awake, alert, oriented x3 Not in acute distress  Lab Results  Component Value Date   HGBA1C 5.7 04/03/2017    Assessment and Plan: 1. Other chronic sinusitis Advised no  indication for antibiotic at this time Encouraged to use saline nasal irrigation Continue with immunotherapy tomorrow and advised to keep appointment with allergist - predniSONE (DELTASONE) 20 MG tablet; Take 1 tablet (20 mg total) by mouth 2 (two) times daily with a meal.  Dispense: 5 tablet; Refill: 0  2. Chronic pain of left knee Status post left knee arthrocentesis - Misc. Devices MISC; Left knee brace. Diagnosis L knee pain  Dispense: 1 each; Refill: 0   Follow Up Instructions: Keep previously scheduled appointment   I discussed the assessment and treatment plan with the patient. The patient was provided an opportunity to ask questions and all were answered. The patient agreed with the plan and  demonstrated an understanding of the instructions.   The patient was advised to call back or seek an in-person evaluation if the symptoms worsen or if the condition fails to improve as anticipated.     I provided 13 minutes total of non-face-to-face time during this encounter including median intraservice time, reviewing previous notes, investigations, ordering medications, medical decision making, coordinating care and patient verbalized understanding at the end of the visit.     Charlott Rakes, MD, FAAFP. [facility] [place], [place]   05/28/2020, 8:46 AM

## 2020-06-01 ENCOUNTER — Telehealth: Payer: Self-pay

## 2020-06-01 DIAGNOSIS — M48062 Spinal stenosis, lumbar region with neurogenic claudication: Secondary | ICD-10-CM | POA: Insufficient documentation

## 2020-06-01 NOTE — Telephone Encounter (Signed)
This CM spoke to the patient who said that he took the prescriptions for the braces to [facility].  Call placed to [facility] to check on status of orders for braces. Spoke to [place] who said that she would need to check with the technician and call this CM back tomorrow.

## 2020-06-02 ENCOUNTER — Ambulatory Visit: Payer: Medicaid Other | Admitting: Family Medicine

## 2020-06-02 ENCOUNTER — Other Ambulatory Visit: Payer: Self-pay | Admitting: Neurosurgery

## 2020-06-02 ENCOUNTER — Ambulatory Visit: Payer: Medicaid Other | Admitting: Neurology

## 2020-06-02 DIAGNOSIS — I723 Aneurysm of iliac artery: Secondary | ICD-10-CM

## 2020-06-03 ENCOUNTER — Ambulatory Visit (INDEPENDENT_AMBULATORY_CARE_PROVIDER_SITE_OTHER): Payer: Medicaid Other

## 2020-06-03 DIAGNOSIS — J309 Allergic rhinitis, unspecified: Secondary | ICD-10-CM | POA: Diagnosis not present

## 2020-06-03 NOTE — Telephone Encounter (Signed)
Call placed to [facility] to check on status of order for braces. Spoke to [place] who said that she will need to get some more information and call this CM back with the status

## 2020-06-09 NOTE — Telephone Encounter (Signed)
Call placed to [place] again to check on the status of the order for braces. Spoke to Jason Stokes who stated that she does not have any more information about the order and is now stated that she does not see any prescriptions.  She will need to check with the technician and said she would call this CM back at # [phone]

## 2020-06-16 ENCOUNTER — Other Ambulatory Visit: Payer: Self-pay

## 2020-06-16 DIAGNOSIS — G8929 Other chronic pain: Secondary | ICD-10-CM

## 2020-06-16 DIAGNOSIS — M25562 Pain in left knee: Secondary | ICD-10-CM

## 2020-06-16 MED ORDER — MISC. DEVICES MISC: 0 refills | Status: AC

## 2020-06-16 NOTE — Telephone Encounter (Signed)
Call placed to [place], spoke to [place] who said she has not been able to find the prescriptions for the braces and requests that they be re-sent. Fax # [phone]. She also stated that she has tried to contact the patient multiple times and has not been able to reach him.

## 2020-06-16 NOTE — Telephone Encounter (Signed)
Orders has been re faxed to number below.

## 2020-06-19 ENCOUNTER — Ambulatory Visit (INDEPENDENT_AMBULATORY_CARE_PROVIDER_SITE_OTHER): Payer: Medicaid Other | Admitting: *Deleted

## 2020-06-19 DIAGNOSIS — J309 Allergic rhinitis, unspecified: Secondary | ICD-10-CM

## 2020-06-23 ENCOUNTER — Other Ambulatory Visit: Payer: Self-pay

## 2020-06-23 ENCOUNTER — Ambulatory Visit
Admission: RE | Admit: 2020-06-23 | Discharge: 2020-06-23 | Disposition: A | Discharge status: Home / Self Care | Payer: Medicaid Other | Source: Ambulatory Visit | Attending: Neurosurgery | Admitting: Neurosurgery

## 2020-06-23 DIAGNOSIS — I723 Aneurysm of iliac artery: Secondary | ICD-10-CM

## 2020-06-23 MED ORDER — IOPAMIDOL (ISOVUE-370) INJECTION 76%
75.0000 mL | Freq: Once | INTRAVENOUS | Status: AC | PRN
  Administered 2020-06-23: 75 mL via INTRAVENOUS

## 2020-06-25 NOTE — Telephone Encounter (Signed)
Call placed to [place], spoke to [place] who said that she has received the orders for the braces.  They have tried to reach him multiple times but have not been successful, they will continue to try to contact him

## 2020-06-30 ENCOUNTER — Ambulatory Visit: Payer: Self-pay | Admitting: *Deleted

## 2020-06-30 NOTE — Telephone Encounter (Signed)
Opened chart to answer a question form the agent.   No action on my part as far as triage or speaking with the pt.

## 2020-07-01 ENCOUNTER — Telehealth: Payer: Self-pay | Admitting: Family Medicine

## 2020-07-01 ENCOUNTER — Other Ambulatory Visit: Payer: Self-pay

## 2020-07-01 ENCOUNTER — Encounter: Payer: Self-pay | Admitting: Family Medicine

## 2020-07-01 ENCOUNTER — Ambulatory Visit: Payer: Medicaid Other | Attending: Family Medicine | Admitting: Family Medicine

## 2020-07-01 DIAGNOSIS — I723 Aneurysm of iliac artery: Secondary | ICD-10-CM | POA: Diagnosis not present

## 2020-07-01 NOTE — Progress Notes (Signed)
Virtual Visit via Telephone Note  I connected with Jason Stokes, on 07/01/2020 at 3:12 PM by telephone due to the COVID-19 pandemic and verified that I am speaking with the correct person using two identifiers.   Consent: I discussed the limitations, risks, security and privacy concerns of performing an evaluation and management service by telephone and the availability of in person appointments. I also discussed with the patient that there may be a patient responsible charge related to this service. The patient expressed understanding and agreed to proceed.   Location of Patient: Out and about  Location of Provider: Clinic   Persons participating in Telemedicine visit: Kyo Cocuzza [place]-CMA Dr. Margarita Rana     History of Present Illness: Jason Guzzetta Smithis a 59 year old male with a history of hypertension, type 2 diabetes mellitus (A1c 5.7), bipolar disorder, paroxysmal A. 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 He had a CT angiogram of the abdomen and pelvis ordered by Dr. Marylyn Ishihara and was informed he had an aneurysm which he would like removed before Christmas (of note today is 07/01/2020) CT angiogram abdomen pelvis: IMPRESSION: 1. 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 Consider elective vascular surgical/specialist consultation. 2. No abdominal aortic aneurysm. 3. Small paraumbilical hernia containing a loop of small bowel without obstruction or strangulation.  He is yelling and informs me he is upset because he has an aneurysm and no one will get back to him.  Review of his chart indicates he called yesterday and he was placed on my schedule to have this discussed today.   Past Medical History:  Diagnosis Date  . Anxiety   . Bilateral carpal tunnel syndrome 01/10/2018  . Bipolar disorder ([place])   . Chronic lower back pain   . Depression   .  Dysrhythmia    a-fib  . GERD (gastroesophageal reflux disease)   . History of alcohol abuse   . History of nuclear stress test    Myoview 10/16: EF 50%, diaphragmatic attenuation, no ischemia, low risk  . Hypertension   . Migraine 2012-2014  . Moderate persistent asthma with acute exacerbation 05/02/2018  . PAF (paroxysmal atrial fibrillation) ([place]) 03/27/2015   a. Myoview neg for ischemia >> Flecainide started 10/16 >> FU ETT   . Schizophrenia ([place])   . Sciatica neuralgia   . Small vessel disease ([place])    Right basal ganglia stroke  . Stroke [facility]) "between 2012-2014"   residual "AF" (09/22/2015)   Allergies  Allergen Reactions  . Lisinopril Anaphylaxis    Swelling of lips and tongue.    Current Outpatient Medications on File Prior to Visit  Medication Sig Dispense Refill  . albuterol (PROAIR HFA) 108 (90 Base) MCG/ACT inhaler Inhale 2 puffs into the lungs every 4 (four) hours as needed for wheezing or shortness of breath. 18 g 2  . amoxicillin (AMOXIL) 500 MG capsule Take 1 capsule (500 mg total) by mouth 3 (three) times daily. 30 capsule 0  . apixaban (ELIQUIS) 5 MG TABS tablet Take 1 tablet (5 mg total) by mouth 2 (two) times daily. 60 tablet 6  . azelastine (ASTELIN) 0.1 % nasal spray Place 1 spray into both nostrils 2 (two) times daily. 30 mL 5  . Azelastine HCl 0.15 % SOLN Place 1 spray into both nostrils 2 (two) times daily. 30 mL 5  . baclofen (LIORESAL) 10 MG tablet Take 10 mg by mouth 3 (three) times daily  as needed for muscle pain.    . carvedilol (COREG) 12.5 MG tablet Take 1 tablet (12.5 mg total) by mouth 2 (two) times daily. 60 tablet 3  . cetirizine (ZYRTEC) 10 MG tablet Take 1 tablet 1-2 times daily as needed. 60 tablet 5  . cyclobenzaprine (FLEXERIL) 10 MG tablet Take 1 tablet (10 mg total) by mouth 2 (two) times daily as needed for muscle spasms. 20 tablet 0  . CYMBALTA 30 MG capsule Take by mouth daily.    Marland Kitchen diltiazem (CARDIZEM CD) 300 MG 24 hr capsule TAKE 1  CAPSULE BY MOUTH EVERY DAY 90 capsule 3  . dronedarone (MULTAQ) 400 MG tablet Take 1 tablet (400 mg total) by mouth 2 (two) times daily with a meal. 180 tablet 3  . fluticasone (FLONASE) 50 MCG/ACT nasal spray Place 1 spray into both nostrils 2 (two) times daily. 16 g 5  . fluticasone furoate-vilanterol (BREO ELLIPTA) 200-25 MCG/INH AEPB Inhale 1 puff into the lungs daily. 60 each 2  . Fluticasone-Salmeterol (ADVAIR) 500-50 MCG/DOSE AEPB Inhale 1 puff into the lungs daily at 12 noon. 60 each 5  . hydrALAZINE (APRESOLINE) 100 MG tablet TAKE 1 TABLET (100 MG TOTAL) BY MOUTH 3 (THREE) TIMES DAILY. 270 tablet 2  . lidocaine (LIDODERM) 5 % Place 1 patch onto the skin daily. Remove & Discard patch within 12 hours or as directed by MD 30 patch 0  . methocarbamol (ROBAXIN) 500 MG tablet Take 1 tablet (500 mg total) by mouth every 8 (eight) hours as needed for muscle spasms. 30 tablet 1  . Misc. Devices MISC Back brace. Diagnosis chronic back pain 1 each 0  . Misc. Devices MISC Left knee brace. Diagnosis L knee pain 1 each 0  . montelukast (SINGULAIR) 10 MG tablet Take 1 tablet (10 mg total) by mouth at bedtime. 30 tablet 5  . Olopatadine HCl (PATADAY) 0.2 % SOLN Use one drop in each eye once daily as needed. 2.5 mL 5  . Olopatadine HCl (PAZEO) 0.7 % SOLN Place 1 drop into both eyes 2 (two) times a day. 1 Bottle 5  . omeprazole (PRILOSEC) 20 MG capsule TAKE 1 CAPSULE BY MOUTH EVERY DAY 90 capsule 2  . Oxycodone HCl 10 MG TABS TAKE 1 (ONE) TABLET FOUR TIMES DAILY, AS NEEDED    . prazosin (MINIPRESS) 1 MG capsule TAKE 1 CAPSULE BY MOUTH EVERYDAY AT BEDTIME  2  . predniSONE (DELTASONE) 20 MG tablet Take 1 tablet (20 mg total) by mouth 2 (two) times daily with a meal. 5 tablet 0  . pregabalin (LYRICA) 75 MG capsule Take 1 capsule by mouth 4 (four) times daily as needed.     No current facility-administered medications on file prior to visit.    Observations/Objective: Awake, alert, oriented x3 Not in  acute distress  Assessment and Plan: 1. Aneurysm of internal iliac artery ([place]) I have referred him to vascular surgery for further evaluation - Ambulatory referral to Vascular Surgery   Follow Up Instructions: Keep previously scheduled appointment   I discussed the assessment and treatment plan with the patient. The patient was provided an opportunity to ask questions and all were answered. The patient agreed with the plan and demonstrated an understanding of the instructions.   The patient was advised to call back or seek an in-person evaluation if the symptoms worsen or if the condition fails to improve as anticipated.     I provided 11 minutes total of non-face-to-face time during this encounter including median  intraservice time, reviewing previous notes, investigations, ordering medications, medical decision making, coordinating care and patient verbalized understanding at the end of the visit.     Charlott Rakes, MD, FAAFP. [facility] and [facility], [place]   07/01/2020, 3:12 PM

## 2020-07-01 NOTE — Telephone Encounter (Signed)
Please schedule a virtual visit to discuss.

## 2020-07-01 NOTE — Telephone Encounter (Signed)
Pt has been scheduled an appointment.

## 2020-07-01 NOTE — Progress Notes (Signed)
Pt would like to discuss his recent CT results.

## 2020-07-01 NOTE — Telephone Encounter (Signed)
Patient would like a call back today if possible.    Copied from [place] [id number]. Topic: General - Call Back - No Documentation >> Jun 30, 2020 12:53 PM Jason Stokes wrote: Reason for CRM: Pt called to report that he had a CT scan and they found an aneurism. Pt wants PCP to review  Best contact: [phone] Pt is requesting a call back today bc he states he is on blood thinners.

## 2020-07-01 NOTE — Telephone Encounter (Signed)
Will route to PCP for review. 

## 2020-07-06 ENCOUNTER — Other Ambulatory Visit: Payer: Self-pay | Admitting: Cardiology

## 2020-07-09 ENCOUNTER — Ambulatory Visit (INDEPENDENT_AMBULATORY_CARE_PROVIDER_SITE_OTHER): Payer: Medicaid Other

## 2020-07-09 DIAGNOSIS — J309 Allergic rhinitis, unspecified: Secondary | ICD-10-CM | POA: Diagnosis not present

## 2020-07-15 ENCOUNTER — Ambulatory Visit: Payer: Medicaid Other | Admitting: Neurology

## 2020-07-21 NOTE — Progress Notes (Signed)
Vials exp 07-22-20

## 2020-07-22 DIAGNOSIS — J301 Allergic rhinitis due to pollen: Secondary | ICD-10-CM

## 2020-07-23 ENCOUNTER — Ambulatory Visit (INDEPENDENT_AMBULATORY_CARE_PROVIDER_SITE_OTHER): Payer: Medicaid Other

## 2020-07-23 DIAGNOSIS — J309 Allergic rhinitis, unspecified: Secondary | ICD-10-CM

## 2020-07-24 ENCOUNTER — Other Ambulatory Visit: Payer: Self-pay | Admitting: Family Medicine

## 2020-07-24 DIAGNOSIS — J3089 Other allergic rhinitis: Secondary | ICD-10-CM | POA: Diagnosis not present

## 2020-08-03 ENCOUNTER — Telehealth: Payer: Self-pay | Admitting: Family Medicine

## 2020-08-03 NOTE — Telephone Encounter (Signed)
Please set pt with a virtual visit or next available.

## 2020-08-03 NOTE — Telephone Encounter (Signed)
We can schedule for a virtual visit.

## 2020-08-03 NOTE — Telephone Encounter (Signed)
Copied from [place] [phone]. Topic: Quick Communication - See Telephone Encounter >> Aug 03, 2020  9:41 AM Loma [place] wrote: CRM for notification. See Telephone encounter for: 08/03/20. Pt wanted to let Dr Delane Ginger know positive for covid, no symptoms as of yet.

## 2020-08-03 NOTE — Telephone Encounter (Signed)
FYI

## 2020-08-11 ENCOUNTER — Other Ambulatory Visit: Payer: Self-pay

## 2020-08-11 ENCOUNTER — Encounter: Payer: Self-pay | Admitting: Vascular Surgery

## 2020-08-11 ENCOUNTER — Ambulatory Visit: Payer: Medicaid Other | Admitting: Vascular Surgery

## 2020-08-11 VITALS — BP 143/84 | HR 94 | Temp 98.2°F

## 2020-08-11 DIAGNOSIS — I723 Aneurysm of iliac artery: Secondary | ICD-10-CM

## 2020-08-11 NOTE — H&P (View-Only) (Signed)
ASSESSMENT & PLAN:  60 y.o. male with 31mm right internal iliac artery aneurysm.  Counseled patient extensively about the natural history of iliac aneurysms.  The usual threshold for intervention is 30 to 35 mm.  Patient is highly anxious about this and desires repair.  I think this is prudent given his young age and good functional status.  We will plan for percutaneous repair of right internal iliac aneurysm with outflow coiling and inflow occlusion via left common femoral artery access in the Cath Lab 08/19/2020.  Hold Eliquis morning of Monday 08/17/2020  CHIEF COMPLAINT:   Right internal iliac artery aneurysm  HISTORY:  HISTORY OF PRESENT ILLNESS: Jason Stokes is a 60 y.o. male referred to clinic for evaluation of right internal iliac artery aneurysm.  Discovered during the course of a work-up for right sciatic pain.  CTA was obtained which demonstrated a 28 mm right internal iliac artery aneurysm.  He was referred to my office for evaluation.  He is asymptomatic from this aneurysm.  He is very nervous about the aneurysm and desires repair.  I counseled him for 30 minutes about the natural history and methods of repair.  Understanding and still wished to proceed with repair.  Past Medical History:  Diagnosis Date  . Anxiety   . Bilateral carpal tunnel syndrome 01/10/2018  . Bipolar disorder ([facility])   . Chronic lower back pain   . Depression   . Dysrhythmia    a-fib  . GERD (gastroesophageal reflux disease)   . History of alcohol abuse   . History of nuclear stress test    Myoview 10/16: EF 50%, diaphragmatic attenuation, no ischemia, low risk  . Hypertension   . Migraine 2012-2014  . Moderate persistent asthma with acute exacerbation 05/02/2018  . PAF (paroxysmal atrial fibrillation) ([facility]) 03/27/2015   a. Myoview neg for ischemia >> Flecainide started 10/16 >> FU ETT   . Schizophrenia ([facility])   . Sciatica neuralgia   . Small vessel disease ([facility])    Right basal ganglia stroke  .  Stroke [facility]) "between 2012-2014"   residual "AF" (09/22/2015)    Past Surgical History:  Procedure Laterality Date  . ATRIAL FIBRILLATION ABLATION  09/22/2015  . CYST EXCISION  1996-97   surgery back of head   . ELECTROPHYSIOLOGIC STUDY N/A 09/22/2015   Procedure: Atrial Fibrillation Ablation;  Surgeon: Will Jorja Loa, MD;  Location: [facility];  Service: Cardiovascular;  Laterality: N/A;  . ELECTROPHYSIOLOGIC STUDY N/A 12/10/2015   Procedure: Atrial Fibrillation Ablation;  Surgeon: Will Jorja Loa, MD;  Location: [facility] CV LAB;  Service: Cardiovascular;  Laterality: N/A;  . ELECTROPHYSIOLOGIC STUDY N/A 12/11/2015   Procedure: Cardioversion;  Surgeon: Will Jorja Loa, MD;  Location: [facility] LAB;  Service: Cardiovascular;  Laterality: N/A;  . EXCISION MASS HEAD N/A 01/06/2017   Procedure: EXCISION MASS FOREHEAD;  Surgeon: Glenna Fellows, MD;  Location: [facility];  Service: Plastics;  Laterality: N/A;  . GANGLION CYST EXCISION Left   . INTERCOSTAL NERVE BLOCK  2005  . KNEE ARTHROSCOPY Right 2016    Family History  Problem Relation Age of Onset  . Heart disease Father   . Schizophrenia Sister     Social History   Socioeconomic History  . Marital status: Single    Spouse name: [name]  . Number of children: 3  . Years of education: HS  . Highest education level: Not on file  Occupational History    Comment: disabled  Tobacco Use  . Smoking status: Former Smoker    Years: 29.00  . Smokeless tobacco: Never Used  Vaping Use  . Vaping Use: Never used  Substance and Sexual Activity  . Alcohol use: Yes    Alcohol/week: 0.0 standard drinks    Comment: drinks social  . Drug use: Yes    Types: Marijuana    Comment: smoked marijuana 01-02-17 for pain  . Sexual activity: Not Currently  Other Topics Concern  . Not on file  Social History Narrative   Patient lives at home with Sargeant.    Patient has 3 children 2 step.    Patient has  13 years of schooling.    Patient is right handed.    Social Determinants of Health   Financial Resource Strain: Not on file  Food Insecurity: Not on file  Transportation Needs: Not on file  Physical Activity: Not on file  Stress: Not on file  Social Connections: Not on file  Intimate Partner Violence: Not on file    Allergies  Allergen Reactions  . Lisinopril Anaphylaxis    Swelling of lips and tongue.    Current Outpatient Medications  Medication Sig Dispense Refill  . albuterol (PROAIR HFA) 108 (90 Base) MCG/ACT inhaler Inhale 2 puffs into the lungs every 4 (four) hours as needed for wheezing or shortness of breath. 18 g 2  . amoxicillin (AMOXIL) 500 MG capsule Take 1 capsule (500 mg total) by mouth 3 (three) times daily. 30 capsule 0  . apixaban (ELIQUIS) 5 MG TABS tablet Take 1 tablet (5 mg total) by mouth 2 (two) times daily. 60 tablet 6  . azelastine (ASTELIN) 0.1 % nasal spray Place 1 spray into both nostrils 2 (two) times daily. 30 mL 5  . Azelastine HCl 0.15 % SOLN Place 1 spray into both nostrils 2 (two) times daily. 30 mL 5  . baclofen (LIORESAL) 10 MG tablet Take 10 mg by mouth 3 (three) times daily as needed for muscle pain.    . carvedilol (COREG) 12.5 MG tablet Take 1 tablet (12.5 mg total) by mouth 2 (two) times daily. 60 tablet 3  . cetirizine (ZYRTEC) 10 MG tablet Take 1 tablet 1-2 times daily as needed. 60 tablet 5  . cyclobenzaprine (FLEXERIL) 10 MG tablet Take 1 tablet (10 mg total) by mouth 2 (two) times daily as needed for muscle spasms. 20 tablet 0  . CYMBALTA 30 MG capsule Take by mouth daily.    Marland Kitchen diltiazem (CARDIZEM CD) 300 MG 24 hr capsule TAKE 1 CAPSULE BY MOUTH EVERY DAY 90 capsule 2  . dronedarone (MULTAQ) 400 MG tablet Take 1 tablet (400 mg total) by mouth 2 (two) times daily with a meal. 180 tablet 3  . fluticasone (FLONASE) 50 MCG/ACT nasal spray Place 1 spray into both nostrils 2 (two) times daily. 16 g 5  . fluticasone furoate-vilanterol (BREO  ELLIPTA) 200-25 MCG/INH AEPB Inhale 1 puff into the lungs daily. 60 each 2  . Fluticasone-Salmeterol (ADVAIR) 500-50 MCG/DOSE AEPB Inhale 1 puff into the lungs daily at 12 noon. 60 each 5  . hydrALAZINE (APRESOLINE) 100 MG tablet TAKE 1 TABLET (100 MG TOTAL) BY MOUTH 3 (THREE) TIMES DAILY. 270 tablet 2  . lidocaine (LIDODERM) 5 % Place 1 patch onto the skin daily. Remove & Discard patch within 12 hours or as directed by MD 30 patch 0  . methocarbamol (ROBAXIN) 500 MG tablet Take 1 tablet (500 mg total) by mouth every 8 (eight) hours as needed  for muscle spasms. 30 tablet 1  . Misc. Devices MISC Back brace. Diagnosis chronic back pain 1 each 0  . Misc. Devices MISC Left knee brace. Diagnosis L knee pain 1 each 0  . montelukast (SINGULAIR) 10 MG tablet Take 1 tablet (10 mg total) by mouth at bedtime. 30 tablet 5  . Olopatadine HCl (PATADAY) 0.2 % SOLN Use one drop in each eye once daily as needed. 2.5 mL 5  . Olopatadine HCl (PAZEO) 0.7 % SOLN Place 1 drop into both eyes 2 (two) times a day. 1 Bottle 5  . omeprazole (PRILOSEC) 20 MG capsule TAKE 1 CAPSULE BY MOUTH EVERY DAY 90 capsule 2  . Oxycodone HCl 10 MG TABS TAKE 1 (ONE) TABLET FOUR TIMES DAILY, AS NEEDED    . prazosin (MINIPRESS) 1 MG capsule TAKE 1 CAPSULE BY MOUTH EVERYDAY AT BEDTIME  2  . predniSONE (DELTASONE) 20 MG tablet Take 1 tablet (20 mg total) by mouth 2 (two) times daily with a meal. 5 tablet 0  . pregabalin (LYRICA) 75 MG capsule Take 1 capsule by mouth 4 (four) times daily as needed.     No current facility-administered medications for this visit.    REVIEW OF SYSTEMS:  [X]  denotes positive finding, [ ]  denotes negative finding Cardiac  Comments:  Chest pain or chest pressure:    Shortness of breath upon exertion: x   Short of breath when lying flat:    Irregular heart rhythm: x       Vascular    Pain in calf, thigh, or hip brought on by ambulation: x   Pain in feet at night that wakes you up from your sleep:  x    Blood clot in your veins:    Leg swelling:  x       Pulmonary    Oxygen at home:    Productive cough:     Wheezing:         Neurologic    Sudden weakness in arms or legs:  x   Sudden numbness in arms or legs:  x   Sudden onset of difficulty speaking or slurred speech:    Temporary loss of vision in one eye:  x   Problems with dizziness:         Gastrointestinal    Blood in stool:     Vomited blood:         Genitourinary    Burning when urinating:     Blood in urine:        Psychiatric    Major depression:  x       Hematologic    Bleeding problems:    Problems with blood clotting too easily:        Skin    Rashes or ulcers: x       Constitutional    Fever or chills:     PHYSICAL EXAM:   Vitals:   08/11/20 0906 08/11/20 0908  BP: 127/85 (!) 143/84  Pulse: 94   Temp: 98.2 F (36.8 C)   TempSrc: Skin   SpO2: 98%    Constitutional: well appearing in no distress. Appears well nourished.  Neurologic: CN intact. No focal findings. No sensory loss. Psychiatric: Mood and affect symmetric and appropriate. Eyes: No icterus. No conjunctival pallor. Ears, nose, throat: mucous membranes moist. Midline trachea.  Cardiac: irregular rate and rhythm.  Respiratory: unlabored. Abdominal: soft, non-tender, non-distended.  Extremity: No edema. No cyanosis. No pallor.  Skin: No gangrene. No ulceration.  Lymphatic: No Stemmer's sign. No palpable lymphadenopathy.   DATA REVIEW:    Most recent CBC CBC Latest Ref Rng & Units 01/03/2020 08/08/2019 01/03/2017  WBC 3.4 - 10.8 x10E3/uL 4.8 5.8 5.1  Hemoglobin 13.0 - 17.7 g/dL 14.2 14.7 13.3  Hematocrit 37.5 - 51.0 % 45.2 46.7 41.5  Platelets 150 - 450 x10E3/uL 215 224 176     Most recent CMP CMP Latest Ref Rng & Units 01/03/2020 08/08/2019 08/14/2018  Glucose 65 - 99 mg/dL 177(H) 95 111(H)  BUN 6 - 24 mg/dL 9 15 13   Creatinine 0.76 - 1.27 mg/dL 1.17 1.49(H) 1.32(H)  Sodium 134 - 144 mmol/L 138 138 142  Potassium 3.5 - 5.2  mmol/L 3.6 4.1 3.8  Chloride 96 - 106 mmol/L 102 104 104  CO2 20 - 29 mmol/L 21 24 22   Calcium 8.7 - 10.2 mg/dL 9.3 9.3 9.6  Total Protein 6.0 - 8.5 g/dL 7.3 - 7.0  Total Bilirubin 0.0 - 1.2 mg/dL 0.4 - 0.3  Alkaline Phos 48 - 121 IU/L 90 - 76  AST 0 - 40 IU/L 13 - 16  ALT 0 - 44 IU/L 14 - 22    Renal function CrCl cannot be calculated (Patient's most recent lab result is older than the maximum 21 days allowed.).  Hemoglobin A1C (no units)  Date Value  04/03/2017 5.7    LDL Calculated  Date Value Ref Range Status  05/07/2018 97 0 - 99 mg/dL Final     Vascular Imaging: CTA A/P 06/23/20  Personally reviewed 75mm right internal iliac artery aneurysm amenable to endovascular therapy  Yevonne Aline. Stanford Breed, MD [facility] Phone Number: [phone] 08/11/2020 9:09 AM

## 2020-08-11 NOTE — Progress Notes (Signed)
  ASSESSMENT & PLAN:  60 y.o. male with 28mm right internal iliac artery aneurysm.  Counseled patient extensively about the natural history of iliac aneurysms.  The usual threshold for intervention is 30 to 35 mm.  Patient is highly anxious about this and desires repair.  I think this is prudent given his young age and good functional status.  We will plan for percutaneous repair of right internal iliac aneurysm with outflow coiling and inflow occlusion via left common femoral artery access in the Cath Lab 08/19/2020.  Hold Eliquis morning of Monday 08/17/2020  CHIEF COMPLAINT:   Right internal iliac artery aneurysm  HISTORY:  HISTORY OF PRESENT ILLNESS: Jason Stokes is a 60 y.o. male referred to clinic for evaluation of right internal iliac artery aneurysm.  Discovered during the course of a work-up for right sciatic pain.  CTA was obtained which demonstrated a 28 mm right internal iliac artery aneurysm.  He was referred to my office for evaluation.  He is asymptomatic from this aneurysm.  He is very nervous about the aneurysm and desires repair.  I counseled him for 30 minutes about the natural history and methods of repair.  Understanding and still wished to proceed with repair.  Past Medical History:  Diagnosis Date  . Anxiety   . Bilateral carpal tunnel syndrome 01/10/2018  . Bipolar disorder (HCC)   . Chronic lower back pain   . Depression   . Dysrhythmia    a-fib  . GERD (gastroesophageal reflux disease)   . History of alcohol abuse   . History of nuclear stress test    Myoview 10/16: EF 50%, diaphragmatic attenuation, no ischemia, low risk  . Hypertension   . Migraine 2012-2014  . Moderate persistent asthma with acute exacerbation 05/02/2018  . PAF (paroxysmal atrial fibrillation) (HCC) 03/27/2015   a. Myoview neg for ischemia >> Flecainide started 10/16 >> FU ETT   . Schizophrenia (HCC)   . Sciatica neuralgia   . Small vessel disease (HCC)    Right basal ganglia stroke  .  Stroke (HCC) "between 2012-2014"   residual "AF" (09/22/2015)    Past Surgical History:  Procedure Laterality Date  . ATRIAL FIBRILLATION ABLATION  09/22/2015  . CYST EXCISION  1996-97   surgery back of head   . ELECTROPHYSIOLOGIC STUDY N/A 09/22/2015   Procedure: Atrial Fibrillation Ablation;  Surgeon: Will Martin Camnitz, MD;  Location: [facility];  Service: Cardiovascular;  Laterality: N/A;  . ELECTROPHYSIOLOGIC STUDY N/A 12/10/2015   Procedure: Atrial Fibrillation Ablation;  Surgeon: Will Martin Camnitz, MD;  Location: [facility];  Service: Cardiovascular;  Laterality: N/A;  . ELECTROPHYSIOLOGIC STUDY N/A 12/11/2015   Procedure: Cardioversion;  Surgeon: Will Martin Camnitz, MD;  Location: [facility];  Service: Cardiovascular;  Laterality: N/A;  . EXCISION MASS HEAD N/A 01/06/2017   Procedure: EXCISION MASS FOREHEAD;  Surgeon: Thimmappa, Brinda, MD;  Location: [facility];  Service: Plastics;  Laterality: N/A;  . GANGLION CYST EXCISION Left   . INTERCOSTAL NERVE BLOCK  2005  . KNEE ARTHROSCOPY Right 2016    Family History  Problem Relation Age of Onset  . Heart disease Father   . Schizophrenia Sister     Social History   Socioeconomic History  . Marital status: Single    Spouse name: Shirley  . Number of children: 3  . Years of education: HS  . Highest education level: Not on file  Occupational History    Comment: disabled    Tobacco Use  . Smoking status: Former Smoker    Years: 29.00  . Smokeless tobacco: Never Used  Vaping Use  . Vaping Use: Never used  Substance and Sexual Activity  . Alcohol use: Yes    Alcohol/week: 0.0 standard drinks    Comment: drinks social  . Drug use: Yes    Types: Marijuana    Comment: smoked marijuana 01-02-17 for pain  . Sexual activity: Not Currently  Other Topics Concern  . Not on file  Social History Narrative   Patient lives at home with [place].    Patient has 3 children 2 step.    Patient has  13 years of schooling.    Patient is right handed.    Social Determinants of Health   Financial Resource Strain: Not on file  Food Insecurity: Not on file  Transportation Needs: Not on file  Physical Activity: Not on file  Stress: Not on file  Social Connections: Not on file  Intimate Partner Violence: Not on file    Allergies  Allergen Reactions  . Lisinopril Anaphylaxis    Swelling of lips and tongue.    Current Outpatient Medications  Medication Sig Dispense Refill  . albuterol (PROAIR HFA) 108 (90 Base) MCG/ACT inhaler Inhale 2 puffs into the lungs every 4 (four) hours as needed for wheezing or shortness of breath. 18 g 2  . amoxicillin (AMOXIL) 500 MG capsule Take 1 capsule (500 mg total) by mouth 3 (three) times daily. 30 capsule 0  . apixaban (ELIQUIS) 5 MG TABS tablet Take 1 tablet (5 mg total) by mouth 2 (two) times daily. 60 tablet 6  . azelastine (ASTELIN) 0.1 % nasal spray Place 1 spray into both nostrils 2 (two) times daily. 30 mL 5  . Azelastine HCl 0.15 % SOLN Place 1 spray into both nostrils 2 (two) times daily. 30 mL 5  . baclofen (LIORESAL) 10 MG tablet Take 10 mg by mouth 3 (three) times daily as needed for muscle pain.    . carvedilol (COREG) 12.5 MG tablet Take 1 tablet (12.5 mg total) by mouth 2 (two) times daily. 60 tablet 3  . cetirizine (ZYRTEC) 10 MG tablet Take 1 tablet 1-2 times daily as needed. 60 tablet 5  . cyclobenzaprine (FLEXERIL) 10 MG tablet Take 1 tablet (10 mg total) by mouth 2 (two) times daily as needed for muscle spasms. 20 tablet 0  . CYMBALTA 30 MG capsule Take by mouth daily.    Marland Kitchen diltiazem (CARDIZEM CD) 300 MG 24 hr capsule TAKE 1 CAPSULE BY MOUTH EVERY DAY 90 capsule 2  . dronedarone (MULTAQ) 400 MG tablet Take 1 tablet (400 mg total) by mouth 2 (two) times daily with a meal. 180 tablet 3  . fluticasone (FLONASE) 50 MCG/ACT nasal spray Place 1 spray into both nostrils 2 (two) times daily. 16 g 5  . fluticasone furoate-vilanterol (BREO  ELLIPTA) 200-25 MCG/INH AEPB Inhale 1 puff into the lungs daily. 60 each 2  . Fluticasone-Salmeterol (ADVAIR) 500-50 MCG/DOSE AEPB Inhale 1 puff into the lungs daily at 12 noon. 60 each 5  . hydrALAZINE (APRESOLINE) 100 MG tablet TAKE 1 TABLET (100 MG TOTAL) BY MOUTH 3 (THREE) TIMES DAILY. 270 tablet 2  . lidocaine (LIDODERM) 5 % Place 1 patch onto the skin daily. Remove & Discard patch within 12 hours or as directed by MD 30 patch 0  . methocarbamol (ROBAXIN) 500 MG tablet Take 1 tablet (500 mg total) by mouth every 8 (eight) hours as needed  for muscle spasms. 30 tablet 1  . Misc. Devices MISC Back brace. Diagnosis chronic back pain 1 each 0  . Misc. Devices MISC Left knee brace. Diagnosis L knee pain 1 each 0  . montelukast (SINGULAIR) 10 MG tablet Take 1 tablet (10 mg total) by mouth at bedtime. 30 tablet 5  . Olopatadine HCl (PATADAY) 0.2 % SOLN Use one drop in each eye once daily as needed. 2.5 mL 5  . Olopatadine HCl (PAZEO) 0.7 % SOLN Place 1 drop into both eyes 2 (two) times a day. 1 Bottle 5  . omeprazole (PRILOSEC) 20 MG capsule TAKE 1 CAPSULE BY MOUTH EVERY DAY 90 capsule 2  . Oxycodone HCl 10 MG TABS TAKE 1 (ONE) TABLET FOUR TIMES DAILY, AS NEEDED    . prazosin (MINIPRESS) 1 MG capsule TAKE 1 CAPSULE BY MOUTH EVERYDAY AT BEDTIME  2  . predniSONE (DELTASONE) 20 MG tablet Take 1 tablet (20 mg total) by mouth 2 (two) times daily with a meal. 5 tablet 0  . pregabalin (LYRICA) 75 MG capsule Take 1 capsule by mouth 4 (four) times daily as needed.     No current facility-administered medications for this visit.    REVIEW OF SYSTEMS:  [X]  denotes positive finding, [ ]  denotes negative finding Cardiac  Comments:  Chest pain or chest pressure:    Shortness of breath upon exertion: x   Short of breath when lying flat:    Irregular heart rhythm: x       Vascular    Pain in calf, thigh, or hip brought on by ambulation: x   Pain in feet at night that wakes you up from your sleep:  x    Blood clot in your veins:    Leg swelling:  x       Pulmonary    Oxygen at home:    Productive cough:     Wheezing:         Neurologic    Sudden weakness in arms or legs:  x   Sudden numbness in arms or legs:  x   Sudden onset of difficulty speaking or slurred speech:    Temporary loss of vision in one eye:  x   Problems with dizziness:         Gastrointestinal    Blood in stool:     Vomited blood:         Genitourinary    Burning when urinating:     Blood in urine:        Psychiatric    Major depression:  x       Hematologic    Bleeding problems:    Problems with blood clotting too easily:        Skin    Rashes or ulcers: x       Constitutional    Fever or chills:     PHYSICAL EXAM:   Vitals:   08/11/20 0906 08/11/20 0908  BP: 127/85 (!) 143/84  Pulse: 94   Temp: 98.2 F (36.8 C)   TempSrc: Skin   SpO2: 98%    Constitutional: well appearing in no distress. Appears well nourished.  Neurologic: CN intact. No focal findings. No sensory loss. Psychiatric: Mood and affect symmetric and appropriate. Eyes: No icterus. No conjunctival pallor. Ears, nose, throat: mucous membranes moist. Midline trachea.  Cardiac: irregular rate and rhythm.  Respiratory: unlabored. Abdominal: soft, non-tender, non-distended.  Extremity: No edema. No cyanosis. No pallor.  Skin: No gangrene. No ulceration.  Lymphatic: No Stemmer's sign. No palpable lymphadenopathy.   DATA REVIEW:    Most recent CBC CBC Latest Ref Rng & Units 01/03/2020 08/08/2019 01/03/2017  WBC 3.4 - 10.8 x10E3/uL 4.8 5.8 5.1  Hemoglobin 13.0 - 17.7 g/dL 14.2 14.7 13.3  Hematocrit 37.5 - 51.0 % 45.2 46.7 41.5  Platelets 150 - 450 x10E3/uL 215 224 176     Most recent CMP CMP Latest Ref Rng & Units 01/03/2020 08/08/2019 08/14/2018  Glucose 65 - 99 mg/dL 177(H) 95 111(H)  BUN 6 - 24 mg/dL 9 15 13   Creatinine 0.76 - 1.27 mg/dL 1.17 1.49(H) 1.32(H)  Sodium 134 - 144 mmol/L 138 138 142  Potassium 3.5 - 5.2  mmol/L 3.6 4.1 3.8  Chloride 96 - 106 mmol/L 102 104 104  CO2 20 - 29 mmol/L 21 24 22   Calcium 8.7 - 10.2 mg/dL 9.3 9.3 9.6  Total Protein 6.0 - 8.5 g/dL 7.3 - 7.0  Total Bilirubin 0.0 - 1.2 mg/dL 0.4 - 0.3  Alkaline Phos 48 - 121 IU/L 90 - 76  AST 0 - 40 IU/L 13 - 16  ALT 0 - 44 IU/L 14 - 22    Renal function CrCl cannot be calculated (Patient's most recent lab result is older than the maximum 21 days allowed.).  Hemoglobin A1C (no units)  Date Value  04/03/2017 5.7    LDL Calculated  Date Value Ref Range Status  05/07/2018 97 0 - 99 mg/dL Final     Vascular Imaging: CTA A/P 06/23/20  Personally reviewed 75mm right internal iliac artery aneurysm amenable to endovascular therapy  Yevonne Aline. Stanford Breed, MD [facility] Phone Number: [phone] 08/11/2020 9:09 AM

## 2020-08-12 ENCOUNTER — Other Ambulatory Visit: Payer: Self-pay

## 2020-08-12 DIAGNOSIS — I723 Aneurysm of iliac artery: Secondary | ICD-10-CM

## 2020-08-18 ENCOUNTER — Other Ambulatory Visit (HOSPITAL_COMMUNITY)
Admission: RE | Admit: 2020-08-18 | Discharge: 2020-08-18 | Disposition: A | Discharge status: Home / Self Care | Payer: Medicaid Other | Source: Ambulatory Visit | Attending: Vascular Surgery | Admitting: Vascular Surgery

## 2020-08-18 DIAGNOSIS — Z20822 Contact with and (suspected) exposure to covid-19: Secondary | ICD-10-CM | POA: Diagnosis not present

## 2020-08-18 DIAGNOSIS — Z01812 Encounter for preprocedural laboratory examination: Secondary | ICD-10-CM | POA: Insufficient documentation

## 2020-08-18 LAB — SARS CORONAVIRUS 2 (TAT 6-24 HRS): SARS Coronavirus 2: NEGATIVE

## 2020-08-19 ENCOUNTER — Other Ambulatory Visit: Payer: Self-pay

## 2020-08-19 ENCOUNTER — Encounter (HOSPITAL_COMMUNITY)
Admission: RE | Disposition: A | Discharge status: Nursing Home (Includes ICF) | Payer: Self-pay | Source: Home / Self Care | Attending: Vascular Surgery

## 2020-08-19 ENCOUNTER — Ambulatory Visit (HOSPITAL_COMMUNITY)
Admission: RE | Admit: 2020-08-19 | Discharge: 2020-08-19 | Disposition: A | Discharge status: Nursing Home (Includes ICF) | Payer: Medicaid Other | Attending: Vascular Surgery | Admitting: Vascular Surgery

## 2020-08-19 ENCOUNTER — Encounter (HOSPITAL_COMMUNITY): Payer: Self-pay | Admitting: Vascular Surgery

## 2020-08-19 DIAGNOSIS — Z79899 Other long term (current) drug therapy: Secondary | ICD-10-CM | POA: Insufficient documentation

## 2020-08-19 DIAGNOSIS — Z7901 Long term (current) use of anticoagulants: Secondary | ICD-10-CM | POA: Insufficient documentation

## 2020-08-19 DIAGNOSIS — I723 Aneurysm of iliac artery: Secondary | ICD-10-CM | POA: Diagnosis present

## 2020-08-19 DIAGNOSIS — Z87891 Personal history of nicotine dependence: Secondary | ICD-10-CM | POA: Diagnosis not present

## 2020-08-19 HISTORY — PX: EMBOLIZATION: CATH118239

## 2020-08-19 HISTORY — PX: EMBOLIZATION (CATH LAB): CATH118239

## 2020-08-19 LAB — POCT I-STAT, CHEM 8
BUN: 12 mg/dL (ref 6–20)
Calcium, Ion: 1.25 mmol/L (ref 1.15–1.40)
Chloride: 104 mmol/L (ref 98–111)
Creatinine, Ser: 1.3 mg/dL — ABNORMAL HIGH (ref 0.61–1.24)
Glucose, Bld: 111 mg/dL — ABNORMAL HIGH (ref 70–99)
HCT: 45 % (ref 39.0–52.0)
Hemoglobin: 15.3 g/dL (ref 13.0–17.0)
Potassium: 3.6 mmol/L (ref 3.5–5.1)
Sodium: 141 mmol/L (ref 135–145)
TCO2: 26 mmol/L (ref 22–32)

## 2020-08-19 SURGERY — EMBOLIZATION
Anesthesia: LOCAL | Laterality: Right

## 2020-08-19 MED ORDER — SODIUM CHLORIDE 0.9 % IV SOLN: INTRAVENOUS | Status: DC

## 2020-08-19 MED ORDER — SODIUM CHLORIDE 0.9% FLUSH: 3.0000 mL | INTRAVENOUS | Status: DC | PRN

## 2020-08-19 MED ORDER — MIDAZOLAM HCL 2 MG/2ML IJ SOLN
INTRAMUSCULAR | Status: DC | PRN
  Administered 2020-08-19 (×3): 1 mg via INTRAVENOUS

## 2020-08-19 MED ORDER — FENTANYL CITRATE (PF) 100 MCG/2ML IJ SOLN
INTRAMUSCULAR | Status: DC | PRN
  Administered 2020-08-19 (×3): 50 ug via INTRAVENOUS

## 2020-08-19 MED ORDER — LIDOCAINE HCL (PF) 1 % IJ SOLN
INTRAMUSCULAR | Status: DC | PRN
  Administered 2020-08-19: 15 mL via INTRADERMAL

## 2020-08-19 MED ORDER — ONDANSETRON HCL 4 MG/2ML IJ SOLN: 4.0000 mg | Freq: Four times a day (QID) | INTRAMUSCULAR | Status: DC | PRN

## 2020-08-19 MED ORDER — HEPARIN SODIUM (PORCINE) 1000 UNIT/ML IJ SOLN
INTRAMUSCULAR | Status: DC | PRN
  Administered 2020-08-19: 6000 [IU] via INTRAVENOUS

## 2020-08-19 MED ORDER — LIDOCAINE HCL (PF) 1 % IJ SOLN
INTRAMUSCULAR | Status: AC
  Filled 2020-08-19: qty 30

## 2020-08-19 MED ORDER — FENTANYL CITRATE (PF) 100 MCG/2ML IJ SOLN
INTRAMUSCULAR | Status: AC
  Filled 2020-08-19: qty 2

## 2020-08-19 MED ORDER — HYDROCODONE-ACETAMINOPHEN 5-325 MG PO TABS
ORAL_TABLET | ORAL | Status: AC
  Administered 2020-08-19: 2 via ORAL
  Filled 2020-08-19: qty 2

## 2020-08-19 MED ORDER — HEPARIN (PORCINE) IN NACL 1000-0.9 UT/500ML-% IV SOLN
INTRAVENOUS | Status: DC | PRN
  Administered 2020-08-19 (×2): 500 mL

## 2020-08-19 MED ORDER — IODIXANOL 320 MG/ML IV SOLN
INTRAVENOUS | Status: DC | PRN
  Administered 2020-08-19: 115 mL via INTRA_ARTERIAL

## 2020-08-19 MED ORDER — ATORVASTATIN CALCIUM 40 MG PO TABS: 40.0000 mg | ORAL_TABLET | Freq: Every day | ORAL | Status: DC

## 2020-08-19 MED ORDER — SODIUM CHLORIDE 0.9 % WEIGHT BASED INFUSION: 1.0000 mL/kg/h | INTRAVENOUS | Status: DC

## 2020-08-19 MED ORDER — ACETAMINOPHEN 325 MG PO TABS: 650.0000 mg | ORAL_TABLET | ORAL | Status: DC | PRN

## 2020-08-19 MED ORDER — HYDRALAZINE HCL 20 MG/ML IJ SOLN: 5.0000 mg | INTRAMUSCULAR | Status: DC | PRN

## 2020-08-19 MED ORDER — SODIUM CHLORIDE 0.9 % IV SOLN: 250.0000 mL | INTRAVENOUS | Status: DC | PRN

## 2020-08-19 MED ORDER — HEPARIN (PORCINE) IN NACL 1000-0.9 UT/500ML-% IV SOLN
INTRAVENOUS | Status: AC
  Filled 2020-08-19: qty 1000

## 2020-08-19 MED ORDER — HYDROCODONE-ACETAMINOPHEN 5-325 MG PO TABS
2.0000 | ORAL_TABLET | Freq: Once | ORAL | Status: AC
Start: 2020-08-19 — End: 2020-08-19

## 2020-08-19 MED ORDER — LABETALOL HCL 5 MG/ML IV SOLN: 10.0000 mg | INTRAVENOUS | Status: DC | PRN

## 2020-08-19 MED ORDER — PROTAMINE SULFATE 10 MG/ML IV SOLN
INTRAVENOUS | Status: AC
  Filled 2020-08-19: qty 5

## 2020-08-19 MED ORDER — SODIUM CHLORIDE 0.9% FLUSH: 3.0000 mL | Freq: Two times a day (BID) | INTRAVENOUS | Status: DC

## 2020-08-19 MED ORDER — MIDAZOLAM HCL 2 MG/2ML IJ SOLN
INTRAMUSCULAR | Status: AC
  Filled 2020-08-19: qty 2

## 2020-08-19 MED ORDER — ASPIRIN EC 81 MG PO TBEC: 81.0000 mg | DELAYED_RELEASE_TABLET | Freq: Every day | ORAL | Status: DC

## 2020-08-19 SURGICAL SUPPLY — 25 items
CATH ANGIO 5F BER2 65CM (CATHETERS) ×2 IMPLANT
CATH CROSS OVER TEMPO 5F (CATHETERS) ×2 IMPLANT
CATH NAVICROSS ANG 65CM (CATHETERS) ×1 IMPLANT
CATH SOFTOUCH MOTARJEME 5F (CATHETERS) ×2 IMPLANT
CATHETER NAVICROSS ANG 65CM (CATHETERS) ×2
CLOSURE PERCLOSE PROSTYLE (VASCULAR PRODUCTS) ×2 IMPLANT
COIL EMBL 14X103.2 LOOP (Embolic) IMPLANT
COIL NESTER 14X10 (Embolic) ×6 IMPLANT
COIL NESTER 14X12 (Embolic) ×6 IMPLANT
COIL NESTER 14X6 (Embolic) ×4 IMPLANT
DEVICE TORQUE H2O (MISCELLANEOUS) ×2 IMPLANT
GLIDEWIRE ADV .035X180CM (WIRE) ×2 IMPLANT
GUIDEWIRE ANGLED .035X150CM (WIRE) ×2 IMPLANT
KIT MICROPUNCTURE NIT STIFF (SHEATH) ×2 IMPLANT
KIT PV (KITS) ×2 IMPLANT
PLUG VASCULAR AMPLATZER 14MM (Vascular Products) ×1 IMPLANT
SHEATH DESTINATION 8F 45CM (SHEATH) ×2 IMPLANT
SHEATH FLEX ANSEL ANG 5F 45CM (SHEATH) ×2 IMPLANT
SHEATH FLEX ANSEL ANG 6F 45CM (SHEATH) ×1 IMPLANT
SHEATH PINNACLE 5F 10CM (SHEATH) ×2 IMPLANT
SHEATH PROBE COVER 6X72 (BAG) ×1 IMPLANT
TRANSDUCER W/STOPCOCK (MISCELLANEOUS) ×2 IMPLANT
TRAY PV CATH (CUSTOM PROCEDURE TRAY) ×2 IMPLANT
WIRE BENTSON .035X145CM (WIRE) ×1 IMPLANT
WIRE ROSEN-J .035X180CM (WIRE) ×2 IMPLANT

## 2020-08-19 NOTE — Interval H&P Note (Signed)
History and Physical Interval Note:  08/19/2020 8:12 AM  Jason Stokes  has presented today for surgery, with the diagnosis of internal illiac atery aneurysm.  The various methods of treatment have been discussed with the patient and family. After consideration of risks, benefits and other options for treatment, the patient has consented to  Procedure(s): LOWER EXTREMITY ANGIOGRAPHY (Right) as a surgical intervention.  The patient's history has been reviewed, patient examined, no change in status, stable for surgery.  I have reviewed the patient's chart and labs.  Questions were answered to the patient's satisfaction.     Cherre Robins

## 2020-08-19 NOTE — Op Note (Signed)
DATE OF SERVICE: 08/19/2020  PATIENT:  Jason Stokes  60 y.o. male  PRE-OPERATIVE DIAGNOSIS:  Right internal iliac artery aneurysm  POST-OPERATIVE DIAGNOSIS:  Same  PROCEDURE:   1) US guided LCFA access 2) Aortogram 3) Right internal iliac artery angiography with third order cannulation (140mL contrast) 4) Embolization of right internal iliac artery aneurysm 5) Conscious sedation (117 min)  SURGEON:  Surgeon(s) and Role:    * Cherre Robins, MD - Primary  ASSISTANT: none  ANESTHESIA:   local and IV sedation  EBL: min  BLOOD ADMINISTERED:none  DRAINS: none   LOCAL MEDICATIONS USED:  LIDOCAINE   SPECIMEN:  none  COUNTS: confirmed correct.  TOURNIQUET:  None  PATIENT DISPOSITION:  PACU - hemodynamically stable.   Delay start of Pharmacological VTE agent (>24hrs) due to surgical blood loss or risk of bleeding: no  INDICATION FOR PROCEDURE: Jason Stokes is a 60 y.o. male with right internal iliac artery aneurysm measuring 51mm. The patient was extremely nervous about the aneurysm. I counseled the patient that the risk of rupture would only increase at >77mm, but he still strongly desired repair. Given his young age and his anxiety about the aneurysm I offered him repair. We discussed risks, benefits, and alternatives. We specifically discussed risk of access site complication. The patient understood and wished to proceed.  OPERATIVE FINDINGS: Highly tortuous aortic bifurcation and origin of right internal iliac artery. Successful coil / plug embolization of right internal iliac artery aneurysm.  DESCRIPTION OF PROCEDURE: After identification of the patient in the pre-operative holding area, the patient was transferred to the operating room. The patient was positioned supine on the operating room table. Anesthesia was induced. The groins was prepped and draped in standard fashion. A surgical pause was performed confirming correct patient, procedure, and operative  location.  The left groin was anesthetized with subcutaneous injection of 1% lidocaine. Using ultrasound guidance, the left common femoral artery was accessed with micropuncture technique. Fluoroscopy was used to confirm cannulation over the femoral head. Sheathogram was not performed. The 734F sheath was upsized to 34F.   An 035 glidewire advantage was advanced into the distal aorta. Over the wire an omni flush catheter was advanced to the level of L2. Aortogram was performed - see above for details.   The right common iliac artery was selected with the 035 glidewire advantage. The wire was advanced into the superficial femoral artery. Over the wire the motarjame catheter was advanced into the external iliac artery. Selective angiography was performed - see above for details.   The decision was made to intervene. The patient was heparinized with 6000 units of heparin. The sheath was exchanged for a 58F x 45cm sheath.  I selected posterior branch of the right hypogastric artery with a Berenstein catheter and Glidewire advantage.  The Bernstein catheter was advanced into the branch.  Angiogram was performed to confirm accurate selection.  The anterior branch was embolized with multiple Nester coils.  I then selected the anterior branch of the right hypogastric artery with Berenstein catheter and Glidewire advantage.  Catheter was advanced into the branch.  Angiogram was performed to confirm accurate selection.  The anterior branch was embolized with multiple Nester coils.  I then attempted to plug embolize the origin of the right hypogastric artery with a 10 mm Amplatzer plug.  This was too small to occlude the origin of the internal iliac artery.  I remove this device.  I upsized our sheath to an 8 [place] by  45 cm sheath.  Reselected the aneurysm and delivered the sheath into the body of the aneurysm.  A 14 mm Amplatzer plug was then delivered cross the orifice of the internal iliac artery the sheath was  withdrawn the plug deployed.  The plug did see a little proud on the origin of the internal iliac artery, but I could not get the plug to securely seat any other way.  1 to 2 mm of encroachment on the orifice of the internal iliac artery was noted flow was not disturbed to the external iliac in any way.  A palpable pulse was noted in the right groin.  Satisfied we ended the case here.  A perclose device was used to close the arteriotomy. Hemostasis was excellent upon completion.  Upon completion of the case instrument and sharps counts were confirmed correct. The patient was transferred to the PACU in good condition. I was present for all portions of the procedure.  Yevonne Aline. Stanford Breed, MD [facility] Phone Number: [phone] 08/19/2020 10:40 AM

## 2020-08-19 NOTE — Discharge Instructions (Signed)

## 2020-08-20 ENCOUNTER — Other Ambulatory Visit: Payer: Self-pay

## 2020-08-20 DIAGNOSIS — I723 Aneurysm of iliac artery: Secondary | ICD-10-CM

## 2020-08-21 ENCOUNTER — Ambulatory Visit (INDEPENDENT_AMBULATORY_CARE_PROVIDER_SITE_OTHER): Payer: Medicaid Other

## 2020-08-21 DIAGNOSIS — J309 Allergic rhinitis, unspecified: Secondary | ICD-10-CM | POA: Diagnosis not present

## 2020-09-03 ENCOUNTER — Ambulatory Visit (INDEPENDENT_AMBULATORY_CARE_PROVIDER_SITE_OTHER): Payer: Medicaid Other

## 2020-09-03 DIAGNOSIS — J309 Allergic rhinitis, unspecified: Secondary | ICD-10-CM | POA: Diagnosis not present

## 2020-09-08 ENCOUNTER — Ambulatory Visit
Admission: RE | Admit: 2020-09-08 | Discharge: 2020-09-08 | Disposition: A | Discharge status: Home / Self Care | Payer: Medicaid Other | Source: Ambulatory Visit | Attending: Vascular Surgery | Admitting: Vascular Surgery

## 2020-09-08 ENCOUNTER — Other Ambulatory Visit: Payer: Self-pay

## 2020-09-08 DIAGNOSIS — I723 Aneurysm of iliac artery: Secondary | ICD-10-CM

## 2020-09-08 MED ORDER — IOPAMIDOL (ISOVUE-370) INJECTION 76%
75.0000 mL | Freq: Once | INTRAVENOUS | Status: AC | PRN
  Administered 2020-09-08: 75 mL via INTRAVENOUS

## 2020-09-10 ENCOUNTER — Encounter: Payer: Self-pay | Admitting: Family Medicine

## 2020-09-10 ENCOUNTER — Other Ambulatory Visit: Payer: Self-pay | Admitting: Family Medicine

## 2020-09-10 ENCOUNTER — Other Ambulatory Visit: Payer: Self-pay

## 2020-09-10 ENCOUNTER — Ambulatory Visit (INDEPENDENT_AMBULATORY_CARE_PROVIDER_SITE_OTHER): Payer: Medicaid Other | Admitting: Family Medicine

## 2020-09-10 VITALS — BP 130/88 | HR 85 | Temp 98.7°F | Resp 14 | Ht 73.0 in | Wt 254.2 lb

## 2020-09-10 DIAGNOSIS — J4541 Moderate persistent asthma with (acute) exacerbation: Secondary | ICD-10-CM | POA: Diagnosis not present

## 2020-09-10 DIAGNOSIS — J302 Other seasonal allergic rhinitis: Secondary | ICD-10-CM

## 2020-09-10 DIAGNOSIS — F17200 Nicotine dependence, unspecified, uncomplicated: Secondary | ICD-10-CM | POA: Diagnosis not present

## 2020-09-10 DIAGNOSIS — H1013 Acute atopic conjunctivitis, bilateral: Secondary | ICD-10-CM

## 2020-09-10 DIAGNOSIS — H1010 Acute atopic conjunctivitis, unspecified eye: Secondary | ICD-10-CM

## 2020-09-10 DIAGNOSIS — Z79899 Other long term (current) drug therapy: Secondary | ICD-10-CM | POA: Diagnosis not present

## 2020-09-10 DIAGNOSIS — J0100 Acute maxillary sinusitis, unspecified: Secondary | ICD-10-CM

## 2020-09-10 MED ORDER — AMOXICILLIN-POT CLAVULANATE 875-125 MG PO TABS: 1.0000 | ORAL_TABLET | Freq: Two times a day (BID) | ORAL | 0 refills | Status: AC

## 2020-09-10 MED ORDER — CETIRIZINE HCL 10 MG PO TABS: 10.0000 mg | ORAL_TABLET | Freq: Two times a day (BID) | ORAL | 5 refills | Status: DC

## 2020-09-10 NOTE — Telephone Encounter (Signed)
Please advise on what else patient can take so that he can take an antihistamine twice daily. Insurance will only cover a 30 day supply for antihistamine.

## 2020-09-10 NOTE — Patient Instructions (Addendum)
Asthma Continue montelukast 10 mg once a day to prevent cough or wheeze.   Continue Breo Ellipta 200-1 puff once a day to prevent cough or wheeze Continue albuterol 2 puffs once every 4 hours as needed for cough or wheeze You may use albuterol 2 puffs 5 to 15 minutes before activity to prevent cough or wheeze Prednisone 10 mg tablets. Take 2 tablets once a day for 4 days, then take 1 tablet on the 5th day, then stop.   Acute sinusitis Begin Augmentin 875 mg twice a day for 10 days Begin Flonase 2 sprays in each nostril once a day for a stuffy nose Restart saline nasal rinses  Allergic rhinitis Continue allergen avoidance measures directed toward dust mite, weed pollen, grass pollen, tree pollen, and dog as listed below Continue allergen immunotherapy and have access to an epinephrine auto injector Continue cetirizine 10 mg once a day as needed for a runny nose or itch.Remember to rotate to a different antihistamine about every 3 months. Some examples of over the counter antihistamines include Zyrtec (cetirizine), Xyzal (levocetirizine), Allegra (fexofenadine), and Claritin (loratidine).  Continue Flonase 1 to 2 sprays in each nostril once a day as needed for stuffy nose. In the right nostril, point the applicator out toward the right ear. In the left nostril, point the applicator out toward the left ear Continue azelastine 1 to 2 sprays in each nostril twice a day as needed for nasal symptoms Consider saline nasal rinses as needed for nasal symptoms. Use this before any medicated nasal sprays for best result  Allergic conjunctivitis Some over the counter eye drops include Pataday one drop in each eye once a day as needed for red, itchy eyes OR Zaditor one drop in each eye twice a day as needed for red itchy eyes.  Reflux Continue dietary lifestyle modifications as listed below Continue omeprazole 20 mg once a day to control reflux  Tobacco use Continue to try to cut down or quit smoking  tobacco  Call the clinic if this treatment plan is not working well for you  Follow up in 2 months or sooner if needed.

## 2020-09-10 NOTE — Progress Notes (Signed)
[place] [address] Dept: [phone]  FOLLOW UP NOTE  Patient ID: Jason Stokes, male    DOB: [birth year]  Age: 60 y.o. MRN: [id number] Date of Office Visit: 09/10/2020  Assessment  Chief Complaint: Sinus Problem (Can't breathe at night. White mucus. Cough at times but very seldom. Sinus headache and eye swelling.)  HPI Jason Stokes is a 60 year old male who presents to the clinic for evaluation of acute asthma exacerbation and acute sinusitis.  He was last seen in this clinic on 10/29/2019 by Dr. Neldon Mc for evaluation of asthma, allergic rhinitis, allergic conjunctivitis, reflux, and tobacco use.  He is currently receiving allergen immunotherapy and beta-blocker therapy.  At today's visit he reports that for the last 1-1/2 weeks he has been experiencing nasal congestion which is worse at night, ocular pruritus with occasional, headache with pressure occurring about both eyes.  Asthma is reported as not well controlled over the last week to week and a half with symptoms including shortness of breath with activity and rest, wheezing occurring in the daytime and nighttime, and occasional dry cough.  Continues montelukast 10 mg once a day, Breo 201 puff once a day, and for the last week and a half has been using albuterol twice a day with moderate relief of symptoms.  Allergic rhinitis is reported as poorly controlled with nasal congestion, clear to yellow rhinorrhea, sneezing continuous sneezing, and continuous thick postnasal drainage.  He continues azelastine twice a day and has used Flonase one time yesterday.  He reports he has been out of cetirizine for the last month and is not using saline nasal rinses.  He continues allergen immunotherapy with no large local reactions.  He reports a significant decrease in his symptoms of allergic rhinitis while continuing on allergen immunotherapy.  Allergic conjunctivitis is reported as poorly controlled with red itchy eyes for which  he is over-the-counter allergy eyedrops.  Reflux is reported as well controlled with omeprazole once a day.  He continues to smoke cigarettes when he gets " stressed out".  His current medications are listed in the chart.   Drug Allergies:  Allergies  Allergen Reactions  . Lisinopril Anaphylaxis    Swelling of lips and tongue.    Physical Exam: BP 130/88   Pulse 85   Temp 98.7 F (37.1 C)   Resp 14   Ht 6\' 1"  (1.854 m)   Wt 254 lb 3.2 oz (115.3 kg)   SpO2 99%   BMI 33.54 kg/m    Physical Exam Vitals reviewed.  Constitutional:      Appearance: Normal appearance.  HENT:     Head: Normocephalic and atraumatic.     Right Ear: Tympanic membrane normal.     Left Ear: Tympanic membrane normal.     Nose:     Comments: Bilateral nares edematous and pale.  Pharynx slightly erythematous with no exudate.  Ears normal.  Eyes with injected conjunctivea Cardiovascular:     Rate and Rhythm: Normal rate and regular rhythm.     Heart sounds: Normal heart sounds. No murmur heard.   Pulmonary:     Effort: Pulmonary effort is normal.     Breath sounds: Normal breath sounds.     Comments: Lungs clear to auscultation Musculoskeletal:        General: Normal range of motion.     Cervical back: Normal range of motion and neck supple.  Skin:    General: Skin is warm and dry.  Neurological:  Mental Status: He is alert and oriented to person, place, and time.  Psychiatric:        Mood and Affect: Mood normal.        Behavior: Behavior normal.        Thought Content: Thought content normal.        Judgment: Judgment normal.     Diagnostics: FVC 3.80, FEV1 2.81.  Predicted FVC 4.47, predicted FEV1 3.49.  Spirometry indicates normal ventilatory function.  Assessment and Plan: 1. Moderate persistent asthma with acute exacerbation   2. Seasonal allergies   3. Current use of beta blocker   4. Light tobacco smoker   5. Seasonal allergic conjunctivitis   6. Acute non-recurrent  maxillary sinusitis     Meds ordered this encounter  Medications  . cetirizine (ZYRTEC) 10 MG tablet    Sig: Take 1 tablet (10 mg total) by mouth 2 (two) times daily.    Dispense:  60 tablet    Refill:  5  . amoxicillin-clavulanate (AUGMENTIN) 875-125 MG tablet    Sig: Take 1 tablet by mouth 2 (two) times daily for 10 days.    Dispense:  20 tablet    Refill:  0    Patient Instructions  Asthma Continue montelukast 10 mg once a day to prevent cough or wheeze.   Continue Breo Ellipta 200-1 puff once a day to prevent cough or wheeze Continue albuterol 2 puffs once every 4 hours as needed for cough or wheeze You may use albuterol 2 puffs 5 to 15 minutes before activity to prevent cough or wheeze Prednisone 10 mg tablets. Take 2 tablets once a day for 4 days, then take 1 tablet on the 5th day, then stop.   Acute sinusitis Begin Augmentin 875 mg twice a day for 10 days Begin Flonase 2 sprays in each nostril once a day for a stuffy nose Restart saline nasal rinses  Allergic rhinitis Continue allergen avoidance measures directed toward dust mite, weed pollen, grass pollen, tree pollen, and dog as listed below Continue allergen immunotherapy and have access to an epinephrine auto injector Continue cetirizine 10 mg once a day as needed for a runny nose or itch.Remember to rotate to a different antihistamine about every 3 months. Some examples of over the counter antihistamines include Zyrtec (cetirizine), Xyzal (levocetirizine), Allegra (fexofenadine), and Claritin (loratidine).  Continue Flonase 1 to 2 sprays in each nostril once a day as needed for stuffy nose. In the right nostril, point the applicator out toward the right ear. In the left nostril, point the applicator out toward the left ear Continue azelastine 1 to 2 sprays in each nostril twice a day as needed for nasal symptoms Consider saline nasal rinses as needed for nasal symptoms. Use this before any medicated nasal sprays for best  result  Allergic conjunctivitis Some over the counter eye drops include Pataday one drop in each eye once a day as needed for red, itchy eyes OR Zaditor one drop in each eye twice a day as needed for red itchy eyes.  Reflux Continue dietary lifestyle modifications as listed below Continue omeprazole 20 mg once a day to control reflux  Tobacco use Continue to try to cut down or quit smoking tobacco  Call the clinic if this treatment plan is not working well for you  Follow up in 2 months or sooner if needed.    Return in about 2 months (around 11/08/2020), or if symptoms worsen or fail to improve.    Thank you  for the opportunity to care for this patient.  Please do not hesitate to contact me with questions.  Gareth Morgan, FNP [facility]

## 2020-09-10 NOTE — Telephone Encounter (Signed)
Lets try carbinoxamine 4 mg up to three times a day as needed please. Have him stop his current antihistamine also please

## 2020-09-14 MED ORDER — CARBINOXAMINE MALEATE 4 MG PO TABS: 4.0000 mg | ORAL_TABLET | Freq: Three times a day (TID) | ORAL | 5 refills | Status: DC

## 2020-09-14 NOTE — Addendum Note (Signed)
Addended by: Clovis Cao A on: 09/14/2020 08:27 AM   Modules accepted: Orders

## 2020-09-14 NOTE — Telephone Encounter (Signed)
Called and spoke to patient and informed him of the change in medication and let him know that it has been sent in. Patient is also aware that the Zyrtec can be purchased over the counter if he's only interested in the zyrtec. Patient was advised to call our office if questions or concerns arose and he agreed.

## 2020-09-15 ENCOUNTER — Encounter: Payer: Medicaid Other | Admitting: Vascular Surgery

## 2020-09-15 ENCOUNTER — Other Ambulatory Visit: Payer: Self-pay | Admitting: Family Medicine

## 2020-09-15 DIAGNOSIS — I10 Essential (primary) hypertension: Secondary | ICD-10-CM

## 2020-09-21 ENCOUNTER — Ambulatory Visit (INDEPENDENT_AMBULATORY_CARE_PROVIDER_SITE_OTHER): Payer: Medicaid Other | Admitting: *Deleted

## 2020-09-21 DIAGNOSIS — J309 Allergic rhinitis, unspecified: Secondary | ICD-10-CM | POA: Diagnosis not present

## 2020-09-29 ENCOUNTER — Ambulatory Visit (INDEPENDENT_AMBULATORY_CARE_PROVIDER_SITE_OTHER): Payer: Medicaid Other | Admitting: Vascular Surgery

## 2020-09-29 ENCOUNTER — Other Ambulatory Visit: Payer: Self-pay

## 2020-09-29 VITALS — BP 139/82 | HR 103 | Temp 98.1°F | Resp 18 | Ht 73.0 in | Wt 257.1 lb

## 2020-09-29 DIAGNOSIS — Z9889 Other specified postprocedural states: Secondary | ICD-10-CM

## 2020-09-29 NOTE — Progress Notes (Signed)
ASSESSMENT & PLAN:  60 y.o. 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 He has some right buttock and thigh claudication which I counseled him is not uncommon after this procedure. He is cleared from my standpoint to undergo any spinal procedure he needs for his sciatica. Follow up with me in 6 months with ABI to ensure buttock / thigh claudication improves.   CHIEF COMPLAINT:   Right internal iliac artery aneurysm  HISTORY:  HISTORY OF PRESENT ILLNESS: Jason Stokes is a 60 y.o. male referred to clinic for evaluation of right internal iliac artery aneurysm.  Discovered during the course of a work-up for right sciatic pain.  CTA was obtained which demonstrated a 28 mm right internal iliac artery aneurysm.  He was referred to my office for evaluation.  He is asymptomatic from this aneurysm.  He is very nervous about the aneurysm and desires repair.  I counseled him for 30 minutes about the natural history and methods of repair.  Understanding and still wished to proceed with repair.  Past Medical History:  Diagnosis Date  . Anxiety   . Bilateral carpal tunnel syndrome 01/10/2018  . Bipolar disorder ([place])   . Chronic lower back pain   . Depression   . Dysrhythmia    a-fib  . GERD (gastroesophageal reflux disease)   . History of alcohol abuse   . History of nuclear stress test    Myoview 10/16: EF 50%, diaphragmatic attenuation, no ischemia, low risk  . Hypertension   . Migraine 2012-2014  . Moderate persistent asthma with acute exacerbation 05/02/2018  . PAF (paroxysmal atrial fibrillation) ([place]) 03/27/2015   a. Myoview neg for ischemia >> Flecainide started 10/16 >> FU ETT   . Schizophrenia ([place])   . Sciatica neuralgia   . Small vessel disease ([place])    Right basal ganglia stroke  . Stroke [facility]) "between 2012-2014"   residual "AF" (09/22/2015)    Past Surgical History:  Procedure Laterality Date  . ATRIAL FIBRILLATION ABLATION   09/22/2015  . CYST EXCISION  1996-97   surgery back of head   . ELECTROPHYSIOLOGIC STUDY N/A 09/22/2015   Procedure: Atrial Fibrillation Ablation;  Surgeon: Will [name], MD;  Location: [place] [facility];  Service: Cardiovascular;  Laterality: N/A;  . ELECTROPHYSIOLOGIC STUDY N/A 12/10/2015   Procedure: Atrial Fibrillation Ablation;  Surgeon: Will [name], MD;  Location: [place] [facility];  Service: Cardiovascular;  Laterality: N/A;  . ELECTROPHYSIOLOGIC STUDY N/A 12/11/2015   Procedure: Cardioversion;  Surgeon: Will [name], MD;  Location: [place] [facility];  Service: Cardiovascular;  Laterality: N/A;  . EMBOLIZATION Right 08/19/2020   Procedure: EMBOLIZATION;  Surgeon: Cherre Robins, MD;  Location: [place] [facility];  Service: Cardiovascular;  Laterality: Right;  hypogastric  . EXCISION MASS HEAD N/A 01/06/2017   Procedure: EXCISION MASS FOREHEAD;  Surgeon: Irene Limbo, MD;  Location: [facility];  Service: Plastics;  Laterality: N/A;  . GANGLION CYST EXCISION Left   . INTERCOSTAL NERVE BLOCK  2005  . KNEE ARTHROSCOPY Right 2016    Family History  Problem Relation Age of Onset  . Heart disease Father   . Schizophrenia Sister     Social History   Socioeconomic History  . Marital status: Single    Spouse name: [name]  . Number of children: 3  . Years of education: HS  . Highest education level: Not on file  Occupational History  Comment: disabled  Tobacco Use  . Smoking status: Former Smoker    Years: 29.00  . Smokeless tobacco: Never Used  Vaping Use  . Vaping Use: Never used  Substance and Sexual Activity  . Alcohol use: Yes    Alcohol/week: 0.0 standard drinks    Comment: drinks social  . Drug use: Yes    Types: Marijuana    Comment: smoked marijuana 01-02-17 for pain  . Sexual activity: Not Currently  Other Topics Concern  . Not on file  Social History Narrative   Patient lives at home with [place].    Patient has 3  children 2 step.    Patient has 13 years of schooling.    Patient is right handed.    Social Determinants of Health   Financial Resource Strain: Not on file  Food Insecurity: Not on file  Transportation Needs: Not on file  Physical Activity: Not on file  Stress: Not on file  Social Connections: Not on file  Intimate Partner Violence: Not on file    Allergies  Allergen Reactions  . Lisinopril Anaphylaxis    Swelling of lips and tongue.    Current Outpatient Medications  Medication Sig Dispense Refill  . albuterol (PROAIR HFA) 108 (90 Base) MCG/ACT inhaler Inhale 2 puffs into the lungs every 4 (four) hours as needed for wheezing or shortness of breath. 18 g 2  . apixaban (ELIQUIS) 5 MG TABS tablet Take 1 tablet (5 mg total) by mouth 2 (two) times daily. 60 tablet 6  . Azelastine HCl 0.15 % SOLN Place 1 spray into both nostrils 2 (two) times daily. 30 mL 5  . baclofen (LIORESAL) 10 MG tablet Take 10 mg by mouth 3 (three) times daily as needed for muscle pain.    . Carbinoxamine Maleate 4 MG TABS Take 1 tablet (4 mg total) by mouth in the morning, at noon, and at bedtime. 90 tablet 5  . carvedilol (COREG) 12.5 MG tablet TAKE 1 TABLET BY MOUTH 2 TIMES DAILY. 60 tablet 0  . cetirizine (ZYRTEC) 10 MG tablet Take 1 tablet (10 mg total) by mouth daily. 60 tablet 5  . cyclobenzaprine (FLEXERIL) 10 MG tablet Take 1 tablet (10 mg total) by mouth 2 (two) times daily as needed for muscle spasms. 20 tablet 0  . CYMBALTA 30 MG capsule Take 30 mg by mouth at bedtime.    Marland Kitchen diltiazem (CARDIZEM CD) 300 MG 24 hr capsule TAKE 1 CAPSULE BY MOUTH EVERY DAY (Patient taking differently: Take 300 mg by mouth daily.) 90 capsule 2  . dronedarone (MULTAQ) 400 MG tablet Take 1 tablet (400 mg total) by mouth 2 (two) times daily with a meal. 180 tablet 3  . fluticasone (FLONASE) 50 MCG/ACT nasal spray Place 1 spray into both nostrils 2 (two) times daily. (Patient taking differently: Place 1 spray into both nostrils  2 (two) times daily as needed for allergies.) 16 g 5  . fluticasone furoate-vilanterol (BREO ELLIPTA) 200-25 MCG/INH AEPB Inhale 1 puff into the lungs daily. 60 each 2  . Fluticasone-Salmeterol (ADVAIR) 500-50 MCG/DOSE AEPB Inhale 1 puff into the lungs daily at 12 noon. (Patient taking differently: Inhale 1 puff into the lungs daily.) 60 each 5  . GARLIC PO Take 5 mLs by mouth daily. Power    . hydrALAZINE (APRESOLINE) 100 MG tablet TAKE 1 TABLET (100 MG TOTAL) BY MOUTH 3 (THREE) TIMES DAILY. 270 tablet 2  . lidocaine (LIDODERM) 5 % Place 1 patch onto the skin daily. Remove &  Discard patch within 12 hours or as directed by MD (Patient taking differently: Place 1 patch onto the skin every 12 (twelve) hours as needed (pain). Remove & Discard patch within 12 hours or as directed by MD) 30 patch 0  . methocarbamol (ROBAXIN) 500 MG tablet Take 1 tablet (500 mg total) by mouth every 8 (eight) hours as needed for muscle spasms. 30 tablet 1  . Misc. Devices MISC Back brace. Diagnosis chronic back pain 1 each 0  . Misc. Devices MISC Left knee brace. Diagnosis L knee pain 1 each 0  . montelukast (SINGULAIR) 10 MG tablet Take 1 tablet (10 mg total) by mouth at bedtime. 30 tablet 5  . Olopatadine HCl (PATADAY) 0.2 % SOLN Use one drop in each eye once daily as needed. 2.5 mL 5  . Olopatadine HCl (PAZEO) 0.7 % SOLN Place 1 drop into both eyes 2 (two) times a day. 1 Bottle 5  . omeprazole (PRILOSEC) 20 MG capsule TAKE 1 CAPSULE BY MOUTH EVERY DAY (Patient taking differently: Take 20 mg by mouth daily.) 90 capsule 2  . Oxycodone HCl 10 MG TABS Take 10 mg by mouth 4 (four) times daily as needed (Pain).    . prazosin (MINIPRESS) 1 MG capsule Take 1 mg by mouth at bedtime.  2  . predniSONE (DELTASONE) 20 MG tablet Take 1 tablet (20 mg total) by mouth 2 (two) times daily with a meal. 5 tablet 0  . pregabalin (LYRICA) 75 MG capsule Take 1 capsule by mouth 4 (four) times daily as needed (pain).    . Vitamin D3 (VITAMIN  D) 25 MCG tablet Take 1,000 Units by mouth daily.     No current facility-administered medications for this visit.    REVIEW OF SYSTEMS:  [X]  denotes positive finding, [ ]  denotes negative finding Cardiac  Comments:  Chest pain or chest pressure:    Shortness of breath upon exertion: x   Short of breath when lying flat:    Irregular heart rhythm: x       Vascular    Pain in calf, thigh, or hip brought on by ambulation: x   Pain in feet at night that wakes you up from your sleep:  x   Blood clot in your veins:    Leg swelling:  x       Pulmonary    Oxygen at home:    Productive cough:     Wheezing:         Neurologic    Sudden weakness in arms or legs:  x   Sudden numbness in arms or legs:  x   Sudden onset of difficulty speaking or slurred speech:    Temporary loss of vision in one eye:  x   Problems with dizziness:         Gastrointestinal    Blood in stool:     Vomited blood:         Genitourinary    Burning when urinating:     Blood in urine:        Psychiatric    Major depression:  x       Hematologic    Bleeding problems:    Problems with blood clotting too easily:        Skin    Rashes or ulcers: x       Constitutional    Fever or chills:     PHYSICAL EXAM:   Vitals:   09/29/20 1018  BP: 139/82  Pulse: (!) 103  Resp: 18  Temp: 98.1 F (36.7 C)  TempSrc: Temporal  SpO2: 99%  Weight: 257 lb 1.6 oz (116.6 kg)  Height: 6\' 1"  (1.854 m)   Constitutional: well appearing in no distress. Appears well nourished.  Neurologic: CN intact. No focal findings. No sensory loss. Psychiatric: Mood and affect symmetric and appropriate. Eyes: No icterus. No conjunctival pallor. Ears, nose, throat: mucous membranes moist. Midline trachea.  Cardiac: irregular rate and rhythm.  Respiratory: unlabored. Abdominal: soft, non-tender, non-distended.  Extremity: No edema. No cyanosis. No pallor.  Skin: No gangrene. No ulceration.  Lymphatic: No Stemmer's sign. No  palpable lymphadenopathy.   DATA REVIEW:    Most recent CBC CBC Latest Ref Rng & Units 08/19/2020 01/03/2020 08/08/2019  WBC 3.4 - 10.8 x10E3/uL - 4.8 5.8  Hemoglobin 13.0 - 17.0 g/dL 15.3 14.2 14.7  Hematocrit 39.0 - 52.0 % 45.0 45.2 46.7  Platelets 150 - 450 x10E3/uL - 215 224     Most recent CMP CMP Latest Ref Rng & Units 08/19/2020 01/03/2020 08/08/2019  Glucose 70 - 99 mg/dL 111(H) 177(H) 95  BUN 6 - 20 mg/dL 12 9 15   Creatinine 0.61 - 1.24 mg/dL 1.30(H) 1.17 1.49(H)  Sodium 135 - 145 mmol/L 141 138 138  Potassium 3.5 - 5.1 mmol/L 3.6 3.6 4.1  Chloride 98 - 111 mmol/L 104 102 104  CO2 20 - 29 mmol/L - 21 24  Calcium 8.7 - 10.2 mg/dL - 9.3 9.3  Total Protein 6.0 - 8.5 g/dL - 7.3 -  Total Bilirubin 0.0 - 1.2 mg/dL - 0.4 -  Alkaline Phos 48 - 121 IU/L - 90 -  AST 0 - 40 IU/L - 13 -  ALT 0 - 44 IU/L - 14 -    Renal function CrCl cannot be calculated (Patient's most recent lab result is older than the maximum 21 days allowed.).  Hemoglobin A1C (no units)  Date Value  04/03/2017 5.7    LDL Calculated  Date Value Ref Range Status  05/07/2018 97 0 - 99 mg/dL Final     Vascular Imaging: CTA A/P 09/08/20 Thrombosis of right internal iliac artery aneurysm after plug / coil embolization.   Yevonne Aline. Stanford Breed, MD [facility] Phone Number: [phone] 09/29/2020 10:53 AM

## 2020-10-01 ENCOUNTER — Other Ambulatory Visit: Payer: Self-pay

## 2020-10-01 DIAGNOSIS — Z9889 Other specified postprocedural states: Secondary | ICD-10-CM

## 2020-10-02 ENCOUNTER — Ambulatory Visit (INDEPENDENT_AMBULATORY_CARE_PROVIDER_SITE_OTHER): Payer: Medicaid Other | Admitting: *Deleted

## 2020-10-02 DIAGNOSIS — J309 Allergic rhinitis, unspecified: Secondary | ICD-10-CM | POA: Diagnosis not present

## 2020-10-09 ENCOUNTER — Ambulatory Visit (INDEPENDENT_AMBULATORY_CARE_PROVIDER_SITE_OTHER): Payer: Medicaid Other | Admitting: *Deleted

## 2020-10-09 DIAGNOSIS — J309 Allergic rhinitis, unspecified: Secondary | ICD-10-CM

## 2020-10-16 ENCOUNTER — Ambulatory Visit (INDEPENDENT_AMBULATORY_CARE_PROVIDER_SITE_OTHER): Payer: Medicaid Other

## 2020-10-16 DIAGNOSIS — J309 Allergic rhinitis, unspecified: Secondary | ICD-10-CM | POA: Diagnosis not present

## 2020-10-22 ENCOUNTER — Ambulatory Visit (INDEPENDENT_AMBULATORY_CARE_PROVIDER_SITE_OTHER): Payer: Medicaid Other | Admitting: *Deleted

## 2020-10-22 DIAGNOSIS — J309 Allergic rhinitis, unspecified: Secondary | ICD-10-CM | POA: Diagnosis not present

## 2020-10-26 ENCOUNTER — Other Ambulatory Visit: Payer: Self-pay | Admitting: Family Medicine

## 2020-10-26 DIAGNOSIS — I10 Essential (primary) hypertension: Secondary | ICD-10-CM

## 2020-10-28 ENCOUNTER — Other Ambulatory Visit: Payer: Self-pay | Admitting: Family Medicine

## 2020-10-28 DIAGNOSIS — I10 Essential (primary) hypertension: Secondary | ICD-10-CM

## 2020-10-29 ENCOUNTER — Telehealth: Payer: Self-pay | Admitting: Family Medicine

## 2020-10-29 NOTE — Telephone Encounter (Signed)
Called patient and LVM advising patient that I was calling from [facility] in regards to his appointment for Wednesday 11/04/20. Unfortunately, Levada Dy is not going to be in the office to complete the visit in-person but she will give him a call at his appointment time for a virtual visit. Advised patient if he wanted to reschedule for a different day in person he could call [phone].   If patient prefers in person and next available in person is too far out- mobile medicine unit will be located at [place] on Wednesday 11/04/20 for walk-in visits from 9-12:15 and 2-6pm.

## 2020-11-04 ENCOUNTER — Other Ambulatory Visit: Payer: Self-pay

## 2020-11-04 ENCOUNTER — Ambulatory Visit: Payer: Medicaid Other | Attending: Physician Assistant | Admitting: Physician Assistant

## 2020-11-04 ENCOUNTER — Encounter: Payer: Self-pay | Admitting: Physician Assistant

## 2020-11-04 DIAGNOSIS — I10 Essential (primary) hypertension: Secondary | ICD-10-CM

## 2020-11-04 DIAGNOSIS — J4520 Mild intermittent asthma, uncomplicated: Secondary | ICD-10-CM

## 2020-11-04 DIAGNOSIS — D171 Benign lipomatous neoplasm of skin and subcutaneous tissue of trunk: Secondary | ICD-10-CM

## 2020-11-04 MED ORDER — ALBUTEROL SULFATE HFA 108 (90 BASE) MCG/ACT IN AERS: 2.0000 | INHALATION_SPRAY | RESPIRATORY_TRACT | 2 refills | Status: DC | PRN

## 2020-11-04 MED ORDER — CARVEDILOL 12.5 MG PO TABS: 12.5000 mg | ORAL_TABLET | Freq: Two times a day (BID) | ORAL | 1 refills | Status: DC

## 2020-11-04 MED ORDER — FLUTICASONE-SALMETEROL 500-50 MCG/DOSE IN AEPB
1.0000 | INHALATION_SPRAY | Freq: Every day | RESPIRATORY_TRACT | 5 refills | Status: DC
Start: 2020-11-04 — End: 2020-11-10

## 2020-11-04 NOTE — Progress Notes (Signed)
Virtual Visit via Telephone Note  I connected with Jason Stokes on 11/04/20 at 10:10 AM EDT by telephone and verified that I am speaking with the correct person using two identifiers.  Location: Patient: home Provider: [facility] office   I discussed the limitations, risks, security and privacy concerns of performing an evaluation and management service by telephone and the availability of in person appointments. I also discussed with the patient that there may be a patient responsible charge related to this service. The patient expressed understanding and agreed to proceed.   History of Present Illness:  Patient is c/o of having a lipoma on the R side of his torso. He has had one before and wants to see the same surgeon and have this removed.  It has been getting larger and bothering him more.  Also needs RF on carvedilol and inhalers    Observations/Objective: NAd.  A&Ox3   Assessment and Plan: 1. Lipoma of torso - Ambulatory referral to General Surgery  2. Essential hypertension Continue current regimen - carvedilol (COREG) 12.5 MG tablet; Take 1 tablet (12.5 mg total) by mouth 2 (two) times daily.  Dispense: 60 tablet; Refill: 1  3. Mild intermittent asthma without complication - Fluticasone-Salmeterol (ADVAIR) 500-50 MCG/DOSE AEPB; Inhale 1 puff into the lungs daily at 12 noon.  Dispense: 60 each; Refill: 5 - albuterol (PROAIR HFA) 108 (90 Base) MCG/ACT inhaler; Inhale 2 puffs into the lungs every 4 (four) hours as needed for wheezing or shortness of breath.  Dispense: 18 g; Refill: 2    Follow Up Instructions: See PCP with chronic conditions in 2-3 months   I discussed the assessment and treatment plan with the patient. The patient was provided an opportunity to ask questions and all were answered. The patient agreed with the plan and demonstrated an understanding of the instructions.   The patient was advised to call back or seek an in-person evaluation if the symptoms worsen  or if the condition fails to improve as anticipated.  I provided 14 minutes of non-face-to-face time during this encounter.   Freeman Caldron, PA-C  Patient ID: Jason Stokes, male   DOB: [birth year], 60 y.o.   MRN: [id number]

## 2020-11-06 ENCOUNTER — Ambulatory Visit (INDEPENDENT_AMBULATORY_CARE_PROVIDER_SITE_OTHER): Payer: Medicaid Other

## 2020-11-06 DIAGNOSIS — J309 Allergic rhinitis, unspecified: Secondary | ICD-10-CM | POA: Diagnosis not present

## 2020-11-10 ENCOUNTER — Other Ambulatory Visit: Payer: Self-pay

## 2020-11-10 ENCOUNTER — Ambulatory Visit: Payer: Medicaid Other | Admitting: Allergy and Immunology

## 2020-11-10 ENCOUNTER — Encounter: Payer: Self-pay | Admitting: Allergy and Immunology

## 2020-11-10 VITALS — BP 120/64 | HR 95 | Temp 98.6°F | Resp 20

## 2020-11-10 DIAGNOSIS — J301 Allergic rhinitis due to pollen: Secondary | ICD-10-CM

## 2020-11-10 DIAGNOSIS — J3089 Other allergic rhinitis: Secondary | ICD-10-CM

## 2020-11-10 DIAGNOSIS — H1013 Acute atopic conjunctivitis, bilateral: Secondary | ICD-10-CM | POA: Diagnosis not present

## 2020-11-10 DIAGNOSIS — J4540 Moderate persistent asthma, uncomplicated: Secondary | ICD-10-CM | POA: Diagnosis not present

## 2020-11-10 DIAGNOSIS — F17200 Nicotine dependence, unspecified, uncomplicated: Secondary | ICD-10-CM

## 2020-11-10 NOTE — Patient Instructions (Addendum)
  1.  Perform Allergen avoidance measures as best as possible.  2.  Use nicotine substitutes to replace tobacco smoke exposure  3.  Continue to Treat and prevent inflammation:   A.  Flonase 1 spray each nostril twice a day  B.  Azelastine nasal spray 1 spray each nostril twice a day  C.  Montelukast 10 mg 1 tablet 1 time per day  D.  Breo 200 - 1 inhalation 1 time per day  E.  Immunotherapy  4. If needed:   A.  Cetirizine 10 mg 1 tablet 1-2 times a day  B.  Pataday - 1 drop each eye 1 time per day  C.  Pro-air HFA 2 puffs every 4-6 hours  5. Return to clinic in 6 months or earlier if problem

## 2020-11-10 NOTE — Progress Notes (Signed)
[facility] [place]   Follow-up Note  Referring Provider: Charlott Rakes, MD Primary Provider: Charlott Rakes, MD Date of Office Visit: 11/10/2020  Subjective:   Jason Stokes (DOB: Dec 12, 1960) is a 60 y.o. male who returns to the [facility] on 11/10/2020 in re-evaluation of the following:  HPI: Khali returns to this clinic in reevaluation of asthma and allergic rhinitis and allergic conjunctivitis and tobacco use.  His last visit with me was 29 October 2019 and he visited with our nurse practitioner on 10 September 2020.  Overall he is doing relatively well since his last visit with our nurse practitioner at which point in time he received a systemic steroid or an antibiotic for a suspected episode of sinusitis and possible atopic flare.  Currently he has use of a short acting bronchodilators averaging out to 1-2 times per week while he continues on his combination inhaler and montelukast.  He is able to breathe through his nose and smell without any problem while using a nasal steroid.  His immunotherapy is currently every 3 weeks without any adverse effect.  He continues to smoke around 3 cigarettes/day.  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  Allergies as of 11/10/2020      Reactions   Lisinopril Anaphylaxis   Swelling of lips and tongue.      Medication List    albuterol 108 (90 Base) MCG/ACT inhaler Commonly known as: ProAir HFA Inhale 2 puffs into the lungs every 4 (four) hours as needed for wheezing or shortness of breath.   apixaban 5 MG Tabs tablet Commonly known as: Eliquis Take 1 tablet (5 mg total) by mouth 2 (two) times daily.   Azelastine HCl 0.15 % Soln Place 1 spray into both  nostrils 2 (two) times daily.   baclofen 10 MG tablet Commonly known as: LIORESAL Take 10 mg by mouth 3 (three) times daily as needed for muscle pain.   Breo Ellipta 200-25 MCG/INH Aepb Generic drug: fluticasone furoate-vilanterol Inhale 1 puff into the lungs daily.   carvedilol 12.5 MG tablet Commonly known as: COREG Take 1 tablet (12.5 mg total) by mouth 2 (two) times daily.   cetirizine 10 MG tablet Commonly known as: ZYRTEC Take 1 tablet (10 mg total) by mouth daily.   cyclobenzaprine 10 MG tablet Commonly known as: FLEXERIL Take 1 tablet (10 mg total) by mouth 2 (two) times daily as needed for muscle spasms.   Cymbalta 30 MG capsule Generic drug: DULoxetine Take 30 mg by mouth at bedtime.   diltiazem 300 MG 24 hr capsule Commonly known as: CARDIZEM CD TAKE 1 CAPSULE BY MOUTH EVERY DAY   dronedarone 400 MG tablet Commonly known as: MULTAQ Take 1 tablet (400 mg total) by mouth 2 (two) times daily with a meal.   fluticasone 50 MCG/ACT nasal spray Commonly known as: FLONASE Place 1 spray into both nostrils 2 (two) times daily.   GARLIC PO Take 5 mLs by mouth daily. Power   hydrALAZINE 100 MG tablet Commonly known as: APRESOLINE TAKE 1 TABLET (100 MG TOTAL) BY MOUTH 3 (THREE) TIMES DAILY.   lidocaine 5 % Commonly known as: Lidoderm Place 1 patch onto the skin daily. Remove & Discard patch  within 12 hours or as directed by MD   methocarbamol 500 MG tablet Commonly known as: Robaxin Take 1 tablet (500 mg total) by mouth every 8 (eight) hours as needed for muscle spasms.   Misc. Devices Misc Back brace. Diagnosis chronic back pain   Misc. Devices Misc Left knee brace. Diagnosis L knee pain   montelukast 10 MG tablet Commonly known as: Singulair Take 1 tablet (10 mg total) by mouth at bedtime.   Olopatadine HCl 0.2 % Soln Commonly known as: Pataday Use one drop in each eye once daily as needed.   omeprazole 20 MG capsule Commonly known as:  PRILOSEC TAKE 1 CAPSULE BY MOUTH EVERY DAY   Oxycodone HCl 10 MG Tabs Take 10 mg by mouth 4 (four) times daily as needed (Pain).   prazosin 1 MG capsule Commonly known as: MINIPRESS Take 1 mg by mouth at bedtime.   pregabalin 75 MG capsule Commonly known as: LYRICA Take 1 capsule by mouth 4 (four) times daily as needed (pain).   Vitamin D3 25 MCG tablet Commonly known as: Vitamin D Take 1,000 Units by mouth daily.       Past Medical History:  Diagnosis Date  . Anxiety   . Bilateral carpal tunnel syndrome 01/10/2018  . Bipolar disorder ([place])   . Chronic lower back pain   . Depression   . Dysrhythmia    a-fib  . GERD (gastroesophageal reflux disease)   . History of alcohol abuse   . History of nuclear stress test    Myoview 10/16: EF 50%, diaphragmatic attenuation, no ischemia, low risk  . Hypertension   . Migraine 2012-2014  . Moderate persistent asthma with acute exacerbation 05/02/2018  . PAF (paroxysmal atrial fibrillation) ([place]) 03/27/2015   a. Myoview neg for ischemia >> Flecainide started 10/16 >> FU ETT   . Schizophrenia ([place])   . Sciatica neuralgia   . Small vessel disease ([place])    Right basal ganglia stroke  . Stroke [facility]) "between 2012-2014"   residual "AF" (09/22/2015)    Past Surgical History:  Procedure Laterality Date  . ATRIAL FIBRILLATION ABLATION  09/22/2015  . CYST EXCISION  1996-97   surgery back of head   . ELECTROPHYSIOLOGIC STUDY N/A 09/22/2015   Procedure: Atrial Fibrillation Ablation;  Surgeon: Will [name], MD;  Location: [facility];  Service: Cardiovascular;  Laterality: N/A;  . ELECTROPHYSIOLOGIC STUDY N/A 12/10/2015   Procedure: Atrial Fibrillation Ablation;  Surgeon: Will [name], MD;  Location: [facility];  Service: Cardiovascular;  Laterality: N/A;  . ELECTROPHYSIOLOGIC STUDY N/A 12/11/2015   Procedure: Cardioversion;  Surgeon: Will [name], MD;  Location: [facility];  Service: Cardiovascular;   Laterality: N/A;  . EMBOLIZATION Right 08/19/2020   Procedure: EMBOLIZATION;  Surgeon: Cherre Robins, MD;  Location: [facility];  Service: Cardiovascular;  Laterality: Right;  hypogastric  . EXCISION MASS HEAD N/A 01/06/2017   Procedure: EXCISION MASS FOREHEAD;  Surgeon: Irene Limbo, MD;  Location: [facility];  Service: Plastics;  Laterality: N/A;  . GANGLION CYST EXCISION Left   . INTERCOSTAL NERVE BLOCK  2005  . KNEE ARTHROSCOPY Right 2016    Review of systems negative except as noted in HPI / PMHx or noted below:  Review of Systems  Constitutional: Negative.   HENT: Negative.   Eyes: Negative.   Respiratory: Negative.   Cardiovascular: Negative.   Gastrointestinal: Negative.   Genitourinary: Negative.   Musculoskeletal: Negative.  Skin: Negative.   Neurological: Negative.   Endo/Heme/Allergies: Negative.   Psychiatric/Behavioral: Negative.      Objective:   Vitals:   11/10/20 1111  BP: 120/64  Pulse: 95  Resp: 20  Temp: 98.6 F (37 C)  SpO2: 97%          Physical Exam Constitutional:      Appearance: He is not diaphoretic.  HENT:     Head: Normocephalic.     Right Ear: Ear canal and external ear normal. There is impacted cerumen.     Left Ear: Ear canal and external ear normal. There is impacted cerumen.     Nose: Nose normal. No mucosal edema or rhinorrhea.     Mouth/Throat:     Pharynx: Uvula midline. No oropharyngeal exudate.  Eyes:     Conjunctiva/sclera: Conjunctivae normal.  Neck:     Thyroid: No thyromegaly.     Trachea: Trachea normal. No tracheal tenderness or tracheal deviation.  Cardiovascular:     Rate and Rhythm: Normal rate and regular rhythm.     Heart sounds: Normal heart sounds, S1 normal and S2 normal. No murmur heard.   Pulmonary:     Effort: No respiratory distress.     Breath sounds: Normal breath sounds. No stridor. No wheezing or rales.  Lymphadenopathy:     Head:     Right side of head: No  tonsillar adenopathy.     Left side of head: No tonsillar adenopathy.     Cervical: No cervical adenopathy.  Skin:    Findings: No erythema or rash.     Nails: There is no clubbing.  Neurological:     Mental Status: He is alert.     Diagnostics:    Spirometry was performed and demonstrated an FEV1 of 2.90 at 85 % of predicted.  Assessment and Plan:   1. Asthma, moderate persistent, well-controlled   2. Perennial allergic rhinitis   3. Seasonal allergic rhinitis due to pollen   4. Perennial allergic conjunctivitis of both eyes   5. Light tobacco smoker     1.  Perform Allergen avoidance measures as best as possible.  2.  Use nicotine substitutes to replace tobacco smoke exposure  3.  Continue to Treat and prevent inflammation:   A.  Flonase 1 spray each nostril twice a day  B.  Azelastine nasal spray 1 spray each nostril twice a day  C.  Montelukast 10 mg 1 tablet 1 time per day  D.  Breo 200 - 1 inhalation 1 time per day  E.  Immunotherapy  4. If needed:   A.  Cetirizine 10 mg 1 tablet 1-2 times a day  B.  Pataday - 1 drop each eye 1 time per day  C.  Pro-air HFA 2 puffs every 4-6 hours  5. Return to clinic in 6 months or earlier if problem  Overall Jarad appears to be stable on a large collection of medical therapy directed against inflammation of his airway which is a result of not just his atopic disease but his ongoing tobacco smoke exposure.  We will keep him on the plan noted above and see him back in his clinic in 6 months or earlier if there is a problem.  Allena Katz, MD Allergy / Immunology [place]

## 2020-11-11 ENCOUNTER — Other Ambulatory Visit (HOSPITAL_COMMUNITY): Payer: Self-pay | Admitting: Neurosurgery

## 2020-11-11 ENCOUNTER — Other Ambulatory Visit: Payer: Self-pay | Admitting: Neurosurgery

## 2020-11-11 ENCOUNTER — Telehealth: Payer: Self-pay | Admitting: *Deleted

## 2020-11-11 ENCOUNTER — Encounter: Payer: Self-pay | Admitting: Allergy and Immunology

## 2020-11-11 DIAGNOSIS — M5441 Lumbago with sciatica, right side: Secondary | ICD-10-CM

## 2020-11-11 NOTE — Telephone Encounter (Signed)
Clinical pharmacist to review Eliquis which the patient takes for atrial fibrillation

## 2020-11-11 NOTE — Telephone Encounter (Signed)
   [facility] Pre-operative Risk Assessment    Patient Name: Jason Stokes  DOB: December 26, 1960  MRN: [id number]   HEARTCARE STAFF: - Please ensure there is not already an duplicate clearance open for this procedure. - Under Visit Info/Reason for Call, type in Other and utilize the format Clearance MM/DD/YY or Clearance TBD. Do not use dashes or single digits. - If request is for dental extraction, please clarify the # of teeth to be extracted.  Request for surgical clearance:  1. What type of surgery is being performed? Lumbar Myelogram/CT   2. When is this surgery scheduled? 11/30/2020    3. What type of clearance is required (medical clearance vs. Pharmacy clearance to hold med vs. Both)? Both  4. Are there any medications that need to be held prior to surgery and how long?Eliquis   5. Practice name and name of physician performing surgery? [facility], Dr Ashok Pall   6. What is the office phone number? [phone]   7.   What is the office fax number? [phone]  8.   Anesthesia type (None, local, MAC, general) ? Valium   Bevelyn Buckles L 11/11/2020, 4:54 PM  _________________________________________________________________   (provider comments below)

## 2020-11-12 NOTE — Telephone Encounter (Signed)
Patient with diagnosis of afib on Eliquis for anticoagulation.    Procedure: Lumbar Myelogram/CT  Date of procedure: 11/30/20  [id number] Score = 3  {Click here to calculate score.  REFRESH note before signing.       :[id number]}LZJQ indicates a 3.2% annual risk of stroke. The patient's score is based upon: CHF History: No HTN History: Yes Diabetes History: No Stroke History: Yes Vascular Disease History: No Age Score: 0 Gender Score: 0      CrCl 81.5 ml/min Platelet count 215  Typically recommend a 3 day hold for spinal procedures. Patient is high risk off anticoagulation due to hx of stroke and LA clot. I will defer to Dr. Curt Bears.

## 2020-11-12 NOTE — Telephone Encounter (Signed)
   Primary Cardiologist: Will [name], MD  Chart reviewed as part of pre-operative protocol coverage. Given past medical history and time since last visit, based on ACC/AHA guidelines, Jason Stokes would be at acceptable risk for the planned procedure without further cardiovascular testing.   Patient with diagnosis of afib on Eliquis for anticoagulation.    Procedure: Lumbar Myelogram/CT Date of procedure: 11/30/20  CHA2DS2-VASc Score = 3  This indicates a 3.2% annual risk of stroke. The patient's score is based upon: CHF History: No HTN History: Yes Diabetes History: No Stroke History: Yes Vascular Disease History: No Age Score: 0 Gender Score: 0      CrCl 81.5 ml/min Platelet count 215  Recommend a 3 day hold for spinal procedures. Patient is high risk off anticoagulation due to hx of stroke and LA clot.  Patient was advised that if he develops new symptoms prior to surgery to contact our office to arrange a follow-up appointment.  He verbalized understanding.  I will route this recommendation to the requesting party via Epic fax function and remove from pre-op pool.  Please call with questions.  Jossie Ng. Messi Twedt NP-C    11/12/2020, 10:27 AM [address] Office [phone] Fax [phone]

## 2020-11-20 ENCOUNTER — Telehealth: Payer: Self-pay | Admitting: Family Medicine

## 2020-11-20 ENCOUNTER — Ambulatory Visit (INDEPENDENT_AMBULATORY_CARE_PROVIDER_SITE_OTHER): Payer: Medicaid Other

## 2020-11-20 DIAGNOSIS — J309 Allergic rhinitis, unspecified: Secondary | ICD-10-CM | POA: Diagnosis not present

## 2020-11-20 DIAGNOSIS — M5416 Radiculopathy, lumbar region: Secondary | ICD-10-CM

## 2020-11-20 NOTE — Telephone Encounter (Signed)
He will need to inform his current pain clinic so they can write him prescriptions to carry him over until his appointment with the next pain clinic.

## 2020-11-20 NOTE — Telephone Encounter (Signed)
Copied from [place] [phone]. Topic: General - Other >> Nov 19, 2020  4:00 PM Celene Kras wrote: Reason for CRM: Pt called stating that he is needing to be referred to another pain clinic other than [facility]. He states that they are requiring him to be seen every two weeks, but that his medicaid is only covering 22 visits. He states that he cannot see anyone until July due to insurance. He states that he has no pain medication and is dealing with sciatic pain in his lower back. Please advise.

## 2020-11-20 NOTE — Telephone Encounter (Signed)
Will route to PCP for review. 

## 2020-11-20 NOTE — Telephone Encounter (Signed)
Pt was called and informed of referral being placed. ?

## 2020-11-23 ENCOUNTER — Other Ambulatory Visit: Payer: Self-pay

## 2020-11-23 ENCOUNTER — Encounter: Payer: Self-pay | Admitting: Cardiology

## 2020-11-23 ENCOUNTER — Ambulatory Visit (INDEPENDENT_AMBULATORY_CARE_PROVIDER_SITE_OTHER): Payer: Medicaid Other | Admitting: Cardiology

## 2020-11-23 VITALS — BP 132/76 | HR 83 | Ht 73.0 in | Wt 271.0 lb

## 2020-11-23 DIAGNOSIS — I480 Paroxysmal atrial fibrillation: Secondary | ICD-10-CM | POA: Diagnosis not present

## 2020-11-23 DIAGNOSIS — Z79899 Other long term (current) drug therapy: Secondary | ICD-10-CM

## 2020-11-23 LAB — CBC
Hematocrit: 39.4 % (ref 37.5–51.0)
Hemoglobin: 13 g/dL (ref 13.0–17.7)
MCH: 25 pg — ABNORMAL LOW (ref 26.6–33.0)
MCHC: 33 g/dL (ref 31.5–35.7)
MCV: 76 fL — ABNORMAL LOW (ref 79–97)
Platelets: 190 10*3/uL (ref 150–450)
RBC: 5.19 x10E6/uL (ref 4.14–5.80)
RDW: 19.2 % — ABNORMAL HIGH (ref 11.6–15.4)
WBC: 8.1 10*3/uL (ref 3.4–10.8)

## 2020-11-23 NOTE — Patient Instructions (Signed)
Medication Instructions:  Your physician recommends that you continue on your current medications as directed. Please refer to the Current Medication list given to you today.  *If you need a refill on your cardiac medications before your next appointment, please call your pharmacy*   Lab Work: Today: CBC If you have labs (blood work) drawn today and your tests are completely normal, you will receive your results only by: MyChart Message (if you have MyChart) OR A paper copy in the mail If you have any lab test that is abnormal or we need to change your treatment, we will call you to review the results.   Testing/Procedures: None ordered   Follow-Up: At [facility], you and your health needs are our priority.  As part of our continuing mission to provide you with exceptional heart care, we have created designated Provider Care Teams.  These Care Teams include your primary Cardiologist (physician) and Advanced Practice Providers (APPs -  Physician Assistants and Nurse Practitioners) who all work together to provide you with the care you need, when you need it.  Your next appointment:   6 month(s)  The format for your next appointment:   In Person  Provider:   Will Camnitz, MD    Thank you for choosing [facility]!!   Kariyah Baugh, RN [phone]     

## 2020-11-23 NOTE — Progress Notes (Signed)
Electrophysiology Office Note   Date:  11/23/2020   ID:  Jason Stokes, DOB Mar 08, 1961, MRN [id number]  PCP:  Charlott Rakes, MD  Cardiologist:  [name] Primary Electrophysiologist: Viana Sleep [name], MD    No chief complaint on file.    History of Present Illness: Jason Stokes is a 60 y.o. male who presents today for electrophysiology evaluation.     He has a history significant for paroxysmal atrial fibrillation, hypertension, and remote CVA.  He presented to the emergency room with atrial fibrillation and rapid rates.  Started on Eliquis and diltiazem.  He followed up in cardiology clinic 03/27/2015 with palpitations.  He was scheduled for ablation on 09/22/2015.  Unfortunately during the procedure he was noted to have a left atrial clot and the procedure was therefore canceled.  He had a repeat ablation 12/10/2015.  Today, denies symptoms of palpitations, chest pain, shortness of breath, orthopnea, PND, lower extremity edema, claudication, dizziness, presyncope, syncope, bleeding, or neurologic sequela. The patient is tolerating medications without difficulties.  Since last being seen he has done well.  He is noted no further episodes of atrial fibrillation.  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  Since that time he has done well and is without major complaint.   Past Medical History:  Diagnosis Date  . Anxiety   . Bilateral carpal tunnel syndrome 01/10/2018  . Bipolar disorder ([place])   . Chronic lower back pain   . Depression   . Dysrhythmia    a-fib  . GERD (gastroesophageal reflux disease)   . History of alcohol abuse   . History of nuclear stress test    Myoview 10/16: EF 50%, diaphragmatic attenuation, no ischemia, low risk  . Hypertension   . Migraine 2012-2014  . Moderate persistent asthma with acute exacerbation 05/02/2018  . PAF (paroxysmal  atrial fibrillation) ([place]) 03/27/2015   a. Myoview neg for ischemia >> Flecainide started 10/16 >> FU ETT   . Schizophrenia ([place])   . Sciatica neuralgia   . Small vessel disease ([place])    Right basal ganglia stroke  . Stroke [facility]) "between 2012-2014"   residual "AF" (09/22/2015)   Past Surgical History:  Procedure Laterality Date  . ATRIAL FIBRILLATION ABLATION  09/22/2015  . CYST EXCISION  1996-97   surgery back of head   . ELECTROPHYSIOLOGIC STUDY N/A 09/22/2015   Procedure: Atrial Fibrillation Ablation;  Surgeon: Jamelle Goldston [name], MD;  Location: [place] [facility];  Service: Cardiovascular;  Laterality: N/A;  . ELECTROPHYSIOLOGIC STUDY N/A 12/10/2015   Procedure: Atrial Fibrillation Ablation;  Surgeon: Verneal Wiers [name], MD;  Location: [place] [facility];  Service: Cardiovascular;  Laterality: N/A;  . ELECTROPHYSIOLOGIC STUDY N/A 12/11/2015   Procedure: Cardioversion;  Surgeon: Nicki Gracy [name], MD;  Location: [place] [facility];  Service: Cardiovascular;  Laterality: N/A;  . EMBOLIZATION Right 08/19/2020   Procedure: EMBOLIZATION;  Surgeon: Cherre Robins, MD;  Location: [place] [facility];  Service: Cardiovascular;  Laterality: Right;  hypogastric  . EXCISION MASS HEAD N/A 01/06/2017   Procedure: EXCISION MASS FOREHEAD;  Surgeon: Irene Limbo, MD;  Location: [place];  Service: Plastics;  Laterality: N/A;  . GANGLION CYST EXCISION Left   . INTERCOSTAL NERVE BLOCK  2005  . KNEE ARTHROSCOPY Right 2016     Current Outpatient Medications  Medication Sig Dispense Refill  . albuterol (  PROAIR HFA) 108 (90 Base) MCG/ACT inhaler Inhale 2 puffs into the lungs every 4 (four) hours as needed for wheezing or shortness of breath. 18 g 2  . apixaban (ELIQUIS) 5 MG TABS tablet Take 1 tablet (5 mg total) by mouth 2 (two) times daily. 60 tablet 6  . Azelastine HCl 0.15 % SOLN Place 1 spray into both nostrils 2 (two) times daily. 30 mL 5  . baclofen (LIORESAL) 10 MG tablet Take  10 mg by mouth 3 (three) times daily as needed for muscle pain.    . carvedilol (COREG) 12.5 MG tablet Take 1 tablet (12.5 mg total) by mouth 2 (two) times daily. 60 tablet 1  . cetirizine (ZYRTEC) 10 MG tablet Take 1 tablet (10 mg total) by mouth daily. 60 tablet 5  . cyclobenzaprine (FLEXERIL) 10 MG tablet Take 1 tablet (10 mg total) by mouth 2 (two) times daily as needed for muscle spasms. 20 tablet 0  . CYMBALTA 30 MG capsule Take 30 mg by mouth at bedtime.    Marland Kitchen diltiazem (CARDIZEM CD) 300 MG 24 hr capsule TAKE 1 CAPSULE BY MOUTH EVERY DAY (Patient taking differently: Take 300 mg by mouth daily.) 90 capsule 2  . dronedarone (MULTAQ) 400 MG tablet Take 1 tablet (400 mg total) by mouth 2 (two) times daily with a meal. 180 tablet 3  . fluticasone (FLONASE) 50 MCG/ACT nasal spray Place 1 spray into both nostrils 2 (two) times daily. (Patient taking differently: Place 1 spray into both nostrils 2 (two) times daily as needed for allergies.) 16 g 5  . fluticasone furoate-vilanterol (BREO ELLIPTA) 200-25 MCG/INH AEPB Inhale 1 puff into the lungs daily. 60 each 2  . GARLIC PO Take 5 mLs by mouth daily. Power    . hydrALAZINE (APRESOLINE) 100 MG tablet TAKE 1 TABLET (100 MG TOTAL) BY MOUTH 3 (THREE) TIMES DAILY. 270 tablet 2  . lidocaine (LIDODERM) 5 % Place 1 patch onto the skin daily. Remove & Discard patch within 12 hours or as directed by MD (Patient taking differently: Place 1 patch onto the skin every 12 (twelve) hours as needed (pain). Remove & Discard patch within 12 hours or as directed by MD) 30 patch 0  . methocarbamol (ROBAXIN) 500 MG tablet Take 1 tablet (500 mg total) by mouth every 8 (eight) hours as needed for muscle spasms. 30 tablet 1  . Misc. Devices MISC Back brace. Diagnosis chronic back pain 1 each 0  . Misc. Devices MISC Left knee brace. Diagnosis L knee pain 1 each 0  . montelukast (SINGULAIR) 10 MG tablet Take 1 tablet (10 mg total) by mouth at bedtime. 30 tablet 5  . Olopatadine  HCl (PATADAY) 0.2 % SOLN Use one drop in each eye once daily as needed. 2.5 mL 5  . omeprazole (PRILOSEC) 20 MG capsule TAKE 1 CAPSULE BY MOUTH EVERY DAY (Patient taking differently: Take 20 mg by mouth daily.) 90 capsule 2  . Oxycodone HCl 10 MG TABS Take 10 mg by mouth 4 (four) times daily as needed (Pain).    . prazosin (MINIPRESS) 1 MG capsule Take 1 mg by mouth at bedtime.  2  . pregabalin (LYRICA) 75 MG capsule Take 1 capsule by mouth 4 (four) times daily as needed (pain).    . Vitamin D3 (VITAMIN D) 25 MCG tablet Take 1,000 Units by mouth daily.     No current facility-administered medications for this visit.    Allergies:   Lisinopril   Social History:  The patient  reports that he has quit smoking. He quit after 29.00 years of use. He has never used smokeless tobacco. He reports current alcohol use. He reports current drug use. Drug: Marijuana.   Family History:  The patient's family history includes Heart disease in his father; Schizophrenia in his sister.   ROS:  Please see the history of present illness.   Otherwise, review of systems is positive for none.   All other systems are reviewed and negative.   PHYSICAL EXAM: VS:  BP 132/76   Pulse 83   Ht 6\' 1"  (1.854 m)   Wt 271 lb (122.9 kg)   BMI 35.75 kg/m  , BMI Body mass index is 35.75 kg/m. GEN: Well nourished, well developed, in no acute distress  HEENT: normal  Neck: no JVD, carotid bruits, or masses Cardiac: RRR; no murmurs, rubs, or gallops,no edema  Respiratory:  clear to auscultation bilaterally, normal work of breathing GI: soft, nontender, nondistended, + BS MS: no deformity or atrophy  Skin: warm and dry Neuro:  Strength and sensation are intact Psych: euthymic mood, full affect  EKG:  EKG is ordered today. Personal review of the ekg ordered shows sinus rhythm, rate 83  Recent Labs: 01/03/2020: ALT 14; Platelets 215 08/19/2020: BUN 12; Creatinine, Ser 1.30; Hemoglobin 15.3; Potassium 3.6; Sodium 141     Lipid Panel     Component Value Date/Time   CHOL 171 05/07/2018 1004   TRIG 204 (H) 05/07/2018 1004   HDL 33 (L) 05/07/2018 1004   CHOLHDL 5.2 (H) 05/07/2018 1004   CHOLHDL 5.4 10/17/2013 1042   VLDL 36 10/17/2013 1042   LDLCALC 97 05/07/2018 1004     Wt Readings from Last 3 Encounters:  11/23/20 271 lb (122.9 kg)  09/29/20 257 lb 1.6 oz (116.6 kg)  09/10/20 254 lb 3.2 oz (115.3 kg)     Other studies Reviewed: Additional studies/ records that were reviewed today include:  SPECT 05/15/15, TTE 03/31/15 Review of the above records today demonstrates:  SPECT  The left ventricular ejection fraction is mildly decreased (45-54%).  Nuclear stress EF: 50%.  There was no ST segment deviation noted during stress.  Defect 1: Moderate sized, mild in intensity, fixed defect in the mid and basal inferior and basal inferolateral wall consistent with diaphragmatic attenuation. No ischemia noted.  This is a low risk study.  TTE - Normal LV systolic function; grade 1 diastolic dysfunction; mildLAE.  ETT 01/26/17 - personally reviewed  Blood pressure demonstrated a normal response to exercise.  There was no ST segment deviation noted during stress.  Negative study for exercise induced ischemia.  There were frequent PVCs, ventricular couplets and ventricular salvos.   ASSESSMENT AND PLAN:  1.  Paroxysmal atrial fibrillation currently on Eliquis and Multaq.  CHA2DS2-VASc 3.  High risk medication monitoring.  Status post ablation 12/10/2015.  He fortunately has remained in sinus rhythm.    2.  Hypertension: Currently well controlled  3.  Syncope: Had another episode that sounds orthostatic in nature.  No changes.  Current medicines are reviewed at length with the patient today.   The patient does not have concerns regarding his medicines.  The following changes were made today: None  Labs/ tests ordered today include:  Orders Placed This Encounter  Procedures  . CBC  .  EKG 12-Lead     Disposition:   FU with Clara Smolen 6 months.  Signed, Dreshawn Hendershott [name], MD  11/23/2020 3:32 PM     [facility]  [address] [place] [address] [address] [phone] (office) [phone] (fax)

## 2020-11-27 ENCOUNTER — Ambulatory Visit (INDEPENDENT_AMBULATORY_CARE_PROVIDER_SITE_OTHER): Payer: Medicaid Other

## 2020-11-27 DIAGNOSIS — J309 Allergic rhinitis, unspecified: Secondary | ICD-10-CM

## 2020-11-30 ENCOUNTER — Other Ambulatory Visit: Payer: Self-pay

## 2020-11-30 ENCOUNTER — Ambulatory Visit (HOSPITAL_COMMUNITY)
Admission: RE | Admit: 2020-11-30 | Discharge: 2020-11-30 | Disposition: A | Discharge status: Home / Self Care | Payer: Medicaid Other | Source: Ambulatory Visit | Attending: Neurosurgery | Admitting: Neurosurgery

## 2020-11-30 DIAGNOSIS — M48061 Spinal stenosis, lumbar region without neurogenic claudication: Secondary | ICD-10-CM | POA: Diagnosis not present

## 2020-11-30 DIAGNOSIS — M5441 Lumbago with sciatica, right side: Secondary | ICD-10-CM

## 2020-11-30 MED ORDER — DIAZEPAM 5 MG PO TABS
10.0000 mg | ORAL_TABLET | Freq: Once | ORAL | Status: AC
  Filled 2020-11-30: qty 2

## 2020-11-30 MED ORDER — DIAZEPAM 5 MG PO TABS
ORAL_TABLET | ORAL | Status: AC
  Administered 2020-11-30: 10 mg via ORAL
  Filled 2020-11-30: qty 1

## 2020-11-30 MED ORDER — IOHEXOL 300 MG/ML  SOLN
10.0000 mL | Freq: Once | INTRAMUSCULAR | Status: AC | PRN
  Administered 2020-11-30: 10 mL via INTRATHECAL

## 2020-11-30 MED ORDER — LIDOCAINE HCL (PF) 1 % IJ SOLN
5.0000 mL | Freq: Once | INTRAMUSCULAR | Status: AC
  Administered 2020-11-30: 5 mL via INTRADERMAL

## 2020-11-30 MED ORDER — OXYCODONE HCL 5 MG PO TABS
5.0000 mg | ORAL_TABLET | ORAL | Status: DC | PRN
  Administered 2020-11-30: 10 mg via ORAL

## 2020-11-30 MED ORDER — DIAZEPAM 5 MG PO TABS
ORAL_TABLET | ORAL | Status: AC
  Filled 2020-11-30: qty 1

## 2020-11-30 MED ORDER — OXYCODONE HCL 5 MG PO TABS
ORAL_TABLET | ORAL | Status: AC
  Filled 2020-11-30: qty 2

## 2020-11-30 MED ORDER — ONDANSETRON HCL 4 MG/2ML IJ SOLN: 4.0000 mg | Freq: Four times a day (QID) | INTRAMUSCULAR | Status: DC | PRN

## 2020-11-30 NOTE — Op Note (Signed)
*   No surgery found * Lumbar Myelogram  PATIENT:  Jason Stokes is a 60 y.o. male  PRE-OPERATIVE DIAGNOSIS:  Lumbar radiculopathy  POST-OPERATIVE DIAGNOSIS:  same  PROCEDURE:  Lumbar Myelogram  SURGEON:  Jasdeep Kepner  ANESTHESIA:   local LOCAL MEDICATIONS USED:  LIDOCAINE  and Amount: 10  ml Procedure Note: IMAD SHOSTAK is a 60 y.o. male Was taken to the fluoroscopy suite and  positioned prone on the fluoroscopy table. His back was prepared and draped in a sterile manner. I infiltrated 10 cc into the lumbar region. I then introduced a spinal needle into the thecal sac at the :4/5 interlaminar space. I infiltrated 10cc of Isovue 300 into the thecal sac. Fluoroscopy showed the needle and contrast in the thecal sac. [name] tolerated the procedure well. he Will be taken to CT for evaluation.     PATIENT DISPOSITION:  Short Stay

## 2020-11-30 NOTE — Discharge Instructions (Signed)
Myelogram and Lumbar Puncture Discharge Instructions  1. Go home and rest quietly for the next 24 hours.  It is important to lie flat for the next 24 hours.  Get up only to go to the restroom.  You may lie in the bed or on a couch on your back, your stomach, your left side or your right side.  You may have one pillow under your head.  You may have pillows between your knees while you are on your side or under your knees while you are on your back.  2. DO NOT drive today.  Recline the seat as far back as it will go, while still wearing your seat belt, on the way home.  3. You may get up to go to the bathroom as needed.  You may sit up for 10 minutes to eat.  You may resume your normal diet and medications unless otherwise indicated.  4. The incidence of headache, nausea, or vomiting is about 5% (one in 20 patients).  If you develop a headache, lie flat and drink plenty of fluids until the headache goes away.  Caffeinated beverages may be helpful.  If you develop severe nausea and vomiting or a headache that does not go away with flat bed rest, call Dr Christella Noa  5. You may resume normal activities after your 24 hours of bed rest is over; however, do not exert yourself strongly or do any heavy lifting tomorrow.  6. Call your physician for a follow-up appointment.  The results of your myelogram will be sent directly to your physician by the following day.  7. If you have any questions or if complications develop after you arrive home, please call  Dr Christella Noa  Discharge instructions have been explained to the patient.  The patient, or the person responsible for the patient, fully understands these instructions.  Myelogram and Lumbar Puncture Discharge Instructions  8. Go home and rest quietly for the next 24 hours.  It is important to lie flat for the next 24 hours.  Get up only to go to the restroom.  You may lie in the bed or on a couch on your back, your stomach, your left side or your right side.   You may have one pillow under your head.  You may have pillows between your knees while you are on your side or under your knees while you are on your back.  9. DO NOT drive today.  Recline the seat as far back as it will go, while still wearing your seat belt, on the way home.  10. You may get up to go to the bathroom as needed.  You may sit up for 10 minutes to eat.  You may resume your normal diet and medications unless otherwise indicated.  11. The incidence of headache, nausea, or vomiting is about 5% (one in 20 patients).  If you develop a headache, lie flat and drink plenty of fluids until the headache goes away.  Caffeinated beverages may be helpful.  If you develop severe nausea and vomiting or a headache that does not go away with flat bed rest, call Dr Christella Noa  12. You may resume normal activities after your 24 hours of bed rest is over; however, do not exert yourself strongly or do any heavy lifting tomorrow.  13. Call your physician for a follow-up appointment.  The results of your myelogram will be sent directly to your physician by the following day.  14. If you have any questions  or if complications develop after you arrive home, please call Dr Christella Noa  Discharge instructions have been explained to the patient.  The patient, or the person responsible for the patient, fully understands these instructions.  Call if any bleeding or signs of infection redness,drainage, fever, pain or swelling at site

## 2020-12-04 ENCOUNTER — Ambulatory Visit (INDEPENDENT_AMBULATORY_CARE_PROVIDER_SITE_OTHER): Payer: Medicaid Other | Admitting: *Deleted

## 2020-12-04 ENCOUNTER — Encounter (HOSPITAL_COMMUNITY): Payer: Self-pay | Admitting: Emergency Medicine

## 2020-12-04 ENCOUNTER — Other Ambulatory Visit: Payer: Self-pay

## 2020-12-04 ENCOUNTER — Ambulatory Visit (HOSPITAL_COMMUNITY)
Admission: EM | Admit: 2020-12-04 | Discharge: 2020-12-04 | Disposition: A | Discharge status: Home / Self Care | Payer: Medicaid Other | Attending: Medical Oncology | Admitting: Medical Oncology

## 2020-12-04 DIAGNOSIS — L0501 Pilonidal cyst with abscess: Secondary | ICD-10-CM

## 2020-12-04 DIAGNOSIS — J309 Allergic rhinitis, unspecified: Secondary | ICD-10-CM | POA: Diagnosis not present

## 2020-12-04 MED ORDER — DOXYCYCLINE HYCLATE 100 MG PO CAPS: 100.0000 mg | ORAL_CAPSULE | Freq: Two times a day (BID) | ORAL | 0 refills | Status: DC

## 2020-12-04 NOTE — ED Provider Notes (Signed)
[place]    CSN: [id number] Arrival date & time: 12/04/20  1414      History   Chief Complaint Chief Complaint  Patient presents with  . Abscess    HPI Jason Stokes is a 60 y.o. male.   HPI  Abscess: Pt presents with an abscess of his lower back. He reports that the has had this off and on for the past month. He reports that it has erupted many times but has never fully gone away. He has been on doxycyline for the past month which has helped some. NO fevers, vomiting or history of MRSA. Of note he is on anticoagulation medication.    Past Medical History:  Diagnosis Date  . Anxiety   . Bilateral carpal tunnel syndrome 01/10/2018  . Bipolar disorder ([place])   . Chronic lower back pain   . Depression   . Dysrhythmia    a-fib  . GERD (gastroesophageal reflux disease)   . History of alcohol abuse   . History of nuclear stress test    Myoview 10/16: EF 50%, diaphragmatic attenuation, no ischemia, low risk  . Hypertension   . Migraine 2012-2014  . Moderate persistent asthma with acute exacerbation 05/02/2018  . PAF (paroxysmal atrial fibrillation) ([place]) 03/27/2015   a. Myoview neg for ischemia >> Flecainide started 10/16 >> FU ETT   . Schizophrenia ([place])   . Sciatica neuralgia   . Small vessel disease ([place])    Right basal ganglia stroke  . Stroke [facility]) "between 2012-2014"   residual "AF" (09/22/2015)    Patient Active Problem List   Diagnosis Date Noted  . Neck pain on left side 03/06/2019  . Musculoskeletal chest pain 07/24/2018  . Asthma, mild intermittent 05/02/2018  . Allergic rhinitis caused by mold 05/02/2018  . Tobacco use 05/02/2018  . Bilateral carpal tunnel syndrome 01/10/2018  . Stroke ([place])   . Schizophrenia ([place])   . Migraine   . History of nuclear stress test   . History of alcohol abuse   . GERD (gastroesophageal reflux disease)   . Dysrhythmia   . Chronic lower back pain   . Anxiety   . Arthritis of knee, right 04/12/2017  .  Bipolar disorder ([place]) 04/03/2017  . Lipoma of forehead 09/22/2016  . Trigger ring finger of right hand 06/22/2016  . Paroxysmal atrial fibrillation (H[facility])   . AF (atrial fibrillation) ([place]) 12/10/2015  . Eczema 07/10/2015  . History of CVA (cerebrovascular accident) 05/04/2015  . Tear of medial meniscus of right knee 03/18/2015  . Chronic pain of right knee 03/12/2015  . Other and unspecified hyperlipidemia 11/25/2013  . Nasal congestion 11/25/2013  . Sinusitis, chronic 05/09/2013  . Chronic low back pain 10/12/2012  . Lumbar radiculopathy 10/12/2012  . Hypertension 10/12/2012  . Depression 10/12/2012  . Insomnia 10/12/2012    Past Surgical History:  Procedure Laterality Date  . ATRIAL FIBRILLATION ABLATION  09/22/2015  . CYST EXCISION  1996-97   surgery back of head   . ELECTROPHYSIOLOGIC STUDY N/A 09/22/2015   Procedure: Atrial Fibrillation Ablation;  Surgeon: Will [name], MD;  Location: [facility];  Service: Cardiovascular;  Laterality: N/A;  . ELECTROPHYSIOLOGIC STUDY N/A 12/10/2015   Procedure: Atrial Fibrillation Ablation;  Surgeon: Will [name], MD;  Location: [facility];  Service: Cardiovascular;  Laterality: N/A;  . ELECTROPHYSIOLOGIC STUDY N/A 12/11/2015   Procedure: Cardioversion;  Surgeon: Will [name], MD;  Location: [facility];  Service: Cardiovascular;  Laterality: N/A;  . EMBOLIZATION Right 08/19/2020   Procedure: EMBOLIZATION;  Surgeon: Cherre Robins, MD;  Location: [facility];  Service: Cardiovascular;  Laterality: Right;  hypogastric  . EXCISION MASS HEAD N/A 01/06/2017   Procedure: EXCISION MASS FOREHEAD;  Surgeon: Irene Limbo, MD;  Location: [facility];  Service: Plastics;  Laterality: N/A;  . GANGLION CYST EXCISION Left   . INTERCOSTAL NERVE BLOCK  2005  . KNEE ARTHROSCOPY Right 2016       Home Medications    Prior to Admission medications   Medication Sig Start Date End Date  Taking? Authorizing Provider  albuterol (PROAIR HFA) 108 (90 Base) MCG/ACT inhaler Inhale 2 puffs into the lungs every 4 (four) hours as needed for wheezing or shortness of breath. 11/04/20   McClung, Dionne Bucy, PA-C  ALPHAGAN P 0.1 % SOLN Place 1 drop into the left eye 2 (two) times daily. 10/28/20   [provider]  apixaban (ELIQUIS) 5 MG TABS tablet Take 1 tablet (5 mg total) by mouth 2 (two) times daily. 05/21/20   Baldwin Jamaica, PA-C  Azelastine HCl 0.15 % SOLN Place 1 spray into both nostrils 2 (two) times daily. 10/29/19   Kozlow, Donnamarie Poag, MD  baclofen (LIORESAL) 10 MG tablet Take 10 mg by mouth 3 (three) times daily as needed for muscle pain. 11/28/19   [provider]  carvedilol (COREG) 12.5 MG tablet Take 1 tablet (12.5 mg total) by mouth 2 (two) times daily. 11/04/20   Argentina Donovan, PA-C  cetirizine (ZYRTEC) 10 MG tablet Take 1 tablet (10 mg total) by mouth daily. 09/10/20   Dara Hoyer, FNP  cyclobenzaprine (FLEXERIL) 10 MG tablet Take 1 tablet (10 mg total) by mouth 2 (two) times daily as needed for muscle spasms. 05/04/20   Henderly, Britni A, PA-C  CYMBALTA 30 MG capsule Take 30 mg by mouth at bedtime. 04/04/19   [provider]  diltiazem (CARDIZEM CD) 300 MG 24 hr capsule TAKE 1 CAPSULE BY MOUTH EVERY DAY Patient taking differently: Take 300 mg by mouth daily. 07/07/20   Camnitz, Ocie Doyne, MD  doxycycline (VIBRA-TABS) 100 MG tablet Take 100 mg by mouth 2 (two) times daily. 10/28/20   [provider]  dronedarone (MULTAQ) 400 MG tablet Take 1 tablet (400 mg total) by mouth 2 (two) times daily with a meal. 05/21/20   Baldwin Jamaica, PA-C  fluticasone [facility]) 50 MCG/ACT nasal spray Place 1 spray into both nostrils 2 (two) times daily. 10/29/19   Kozlow, Donnamarie Poag, MD  fluticasone furoate-vilanterol (BREO ELLIPTA) 200-25 MCG/INH AEPB Inhale 1 puff into the lungs daily. 10/29/19   Kozlow, Donnamarie Poag, MD  folic acid (FOLVITE) 1 MG tablet Take 1 mg by  mouth daily. 10/16/20   [provider]  GARLIC PO Take 1 tablet by mouth daily as needed (To build immune system up).    [provider]  hydrALAZINE (APRESOLINE) 100 MG tablet TAKE 1 TABLET (100 MG TOTAL) BY MOUTH 3 (THREE) TIMES DAILY. 03/20/20   Camnitz, Will Hassell Done, MD  lidocaine (LIDODERM) 5 % Place 1 patch onto the skin daily. Remove & Discard patch within 12 hours or as directed by MD Patient taking differently: Place 1 patch onto the skin every 12 (twelve) hours as needed (pain). Remove & Discard patch within 12 hours or as directed by MD 05/04/20   Henderly, Britni A, PA-C  methocarbamol (ROBAXIN) 500 MG tablet Take 1 tablet (500 mg total)  by mouth every 8 (eight) hours as needed for muscle spasms. 01/14/20   Glean Salvo, NP  methotrexate (RHEUMATREX) 2.5 MG tablet Take 2.5 mg by mouth daily. 11/11/20   [provider]  Misc. Devices MISC Back brace. Diagnosis chronic back pain 06/16/20   Hoy Register, MD  Misc. Devices MISC Left knee brace. Diagnosis L knee pain 06/16/20   Hoy Register, MD  montelukast (SINGULAIR) 10 MG tablet Take 1 tablet (10 mg total) by mouth at bedtime. 10/29/19   Kozlow, Alvira Philips, MD  naloxone [facility]) nasal spray 4 mg/0.1 mL Place 1 spray into the nose daily as needed for opioid reversal. 10/19/20   [provider]  Olopatadine HCl (PATADAY) 0.2 % SOLN Use one drop in each eye once daily as needed. Patient taking differently: Place 1 drop into both eyes daily as needed (Dry eye). 10/29/19   Kozlow, Alvira Philips, MD  omeprazole (PRILOSEC) 20 MG capsule TAKE 1 CAPSULE BY MOUTH EVERY DAY Patient taking differently: Take 20 mg by mouth daily. 04/22/20   Hoy Register, MD  Oxycodone HCl 10 MG TABS Take 10 mg by mouth 4 (four) times daily as needed (Pain). 12/07/18   [provider]  prazosin (MINIPRESS) 1 MG capsule Take 1 mg by mouth at bedtime. 09/04/17   [provider]  prednisoLONE acetate (PRED FORTE) 1 % ophthalmic  suspension Place 1 drop into the left eye 6 (six) times daily. 10/08/20   [provider]  predniSONE (DELTASONE) 20 MG tablet Take 20 mg by mouth 3 (three) times daily.    [provider]  pregabalin (LYRICA) 75 MG capsule Take 1 capsule by mouth 4 (four) times daily as needed (pain). 09/17/18   [provider]  SIMBRINZA 1-0.2 % SUSP Place 1 drop into both eyes daily as needed for allergies. 10/21/20   [provider]  Vitamin D3 (VITAMIN D) 25 MCG tablet Take 1,000 Units by mouth daily.    [provider]    Family History Family History  Problem Relation Age of Onset  . Heart disease Father   . Schizophrenia Sister     Social History Social History   Tobacco Use  . Smoking status: Former Smoker    Years: 29.00  . Smokeless tobacco: Never Used  Vaping Use  . Vaping Use: Never used  Substance Use Topics  . Alcohol use: Yes    Alcohol/week: 0.0 standard drinks    Comment: drinks social  . Drug use: Yes    Types: Marijuana    Comment: smoked marijuana 01-02-17 for pain     Allergies   Lisinopril   Review of Systems Review of Systems  As stated above in HPI Physical Exam Triage Vital Signs ED Triage Vitals  Enc Vitals Group     BP 12/04/20 1430 124/74     Pulse Rate 12/04/20 1430 76     Resp 12/04/20 1430 18     Temp 12/04/20 1430 98.3 F (36.8 C)     Temp Source 12/04/20 1430 Oral     SpO2 12/04/20 1430 99 %     Weight --      Height --      Head Circumference --      Peak Flow --      Pain Score 12/04/20 1428 8     Pain Loc --      Pain Edu? --      Excl. in GC? --    No data found.  Updated Vital Signs BP 124/74 (BP Location: Left Arm)   Pulse 76   Temp 98.3 F (36.8 C) (Oral)   Resp 18   SpO2 99%   Physical Exam Vitals and nursing note reviewed.  Constitutional:      General: He is not in acute distress.    Appearance: Normal appearance. He is obese. He is not ill-appearing, toxic-appearing or  diaphoretic.  Cardiovascular:     Rate and Rhythm: Normal rate and regular rhythm.     Heart sounds: Normal heart sounds.  Pulmonary:     Effort: Pulmonary effort is normal.     Breath sounds: Normal breath sounds.  Skin:    Comments: There is resolving erythema and edema of the midline low back above buttock. There is a small draining sinus along with induration and fluctuance.   Neurological:     Mental Status: He is alert.      UC Treatments / Results  Labs (all labs ordered are listed, but only abnormal results are displayed) Labs Reviewed - No data to display  EKG   Radiology No results found.  Procedures Incision and Drainage  Date/Time: 12/04/2020 3:19 PM Performed by: Hughie Closs, PA-C Authorized by: Hughie Closs, PA-C   Consent:    Consent obtained:  Verbal   Consent given by:  Patient   Risks, benefits, and alternatives were discussed: yes     Risks discussed:  Bleeding, incomplete drainage, pain and infection   Alternatives discussed:  Delayed treatment and alternative treatment Universal protocol:    Patient identity confirmed:  Verbally with patient Location:    Type:  Pilonidal cyst Pre-procedure details:    Skin preparation:  Antiseptic wash Sedation:    Sedation type:  None Anesthesia:    Anesthesia method:  Local infiltration   Local anesthetic:  Lidocaine 1% WITH epi Procedure type:    Complexity:  Simple Procedure details:    Incision types:  Single straight   Incision depth:  Subcutaneous   Drainage:  Bloody and purulent (non-malodorous)   Drainage amount:  Moderate   Wound treatment:  Wound left open   Packing materials:  None Post-procedure details:    Procedure completion:  Tolerated well, no immediate complications   (including critical care time)  Medications Ordered in UC Medications - No data to display  Initial Impression / Assessment and Plan / UC Course  I have reviewed the triage vital signs and the nursing  notes.  Pertinent labs & imaging results that were available during my care of the patient were reviewed by me and considered in my medical decision making (see chart for details).     New.  Discussed that this is a pilonidal cyst.  I offered to either switch his antibiotic and observe or perform an I&D.  At this time he would like to proceed forward with an I&D.  Wound culture obtained.  As he is improving on the doxycycline and after I&D procedure I would like for him to continue the doxycycline medication and we will switch this as needed based off of his culture results.  We discussed red flag signs and symptoms as well. Final Clinical Impressions(s) / UC Diagnoses   Final diagnoses:  None   Discharge Instructions   None    ED Prescriptions    None     PDMP not reviewed this encounter.   Hughie Closs, [place] 12/04/20 1521

## 2020-12-04 NOTE — ED Triage Notes (Signed)
Pt presents with abscess on lower back xs 1 month, states has popped multiple times.

## 2020-12-06 ENCOUNTER — Emergency Department (HOSPITAL_COMMUNITY): Payer: Medicaid Other

## 2020-12-06 ENCOUNTER — Encounter (HOSPITAL_COMMUNITY): Payer: Self-pay | Admitting: *Deleted

## 2020-12-06 ENCOUNTER — Other Ambulatory Visit: Payer: Self-pay

## 2020-12-06 ENCOUNTER — Ambulatory Visit (HOSPITAL_COMMUNITY)
Admission: EM | Admit: 2020-12-06 | Discharge: 2020-12-06 | Disposition: A | Discharge status: Home / Self Care | Payer: Medicaid Other

## 2020-12-06 ENCOUNTER — Encounter (HOSPITAL_COMMUNITY): Payer: Self-pay | Admitting: Emergency Medicine

## 2020-12-06 ENCOUNTER — Emergency Department (HOSPITAL_COMMUNITY)
Admission: EM | Admit: 2020-12-06 | Discharge: 2020-12-06 | Disposition: A | Discharge status: Home / Self Care | Payer: Medicaid Other | Attending: Emergency Medicine | Admitting: Emergency Medicine

## 2020-12-06 DIAGNOSIS — Z7901 Long term (current) use of anticoagulants: Secondary | ICD-10-CM | POA: Diagnosis not present

## 2020-12-06 DIAGNOSIS — M25561 Pain in right knee: Secondary | ICD-10-CM | POA: Diagnosis not present

## 2020-12-06 DIAGNOSIS — Y92410 Unspecified street and highway as the place of occurrence of the external cause: Secondary | ICD-10-CM | POA: Diagnosis not present

## 2020-12-06 DIAGNOSIS — I10 Essential (primary) hypertension: Secondary | ICD-10-CM | POA: Insufficient documentation

## 2020-12-06 DIAGNOSIS — Z7951 Long term (current) use of inhaled steroids: Secondary | ICD-10-CM | POA: Diagnosis not present

## 2020-12-06 DIAGNOSIS — M5410 Radiculopathy, site unspecified: Secondary | ICD-10-CM | POA: Diagnosis not present

## 2020-12-06 DIAGNOSIS — Z79899 Other long term (current) drug therapy: Secondary | ICD-10-CM | POA: Diagnosis not present

## 2020-12-06 DIAGNOSIS — M542 Cervicalgia: Secondary | ICD-10-CM

## 2020-12-06 DIAGNOSIS — Z87891 Personal history of nicotine dependence: Secondary | ICD-10-CM | POA: Insufficient documentation

## 2020-12-06 DIAGNOSIS — M5441 Lumbago with sciatica, right side: Secondary | ICD-10-CM | POA: Diagnosis not present

## 2020-12-06 DIAGNOSIS — J4541 Moderate persistent asthma with (acute) exacerbation: Secondary | ICD-10-CM | POA: Insufficient documentation

## 2020-12-06 DIAGNOSIS — S2232XA Fracture of one rib, left side, initial encounter for closed fracture: Secondary | ICD-10-CM | POA: Insufficient documentation

## 2020-12-06 DIAGNOSIS — S161XXA Strain of muscle, fascia and tendon at neck level, initial encounter: Secondary | ICD-10-CM | POA: Diagnosis not present

## 2020-12-06 DIAGNOSIS — S299XXA Unspecified injury of thorax, initial encounter: Secondary | ICD-10-CM | POA: Diagnosis present

## 2020-12-06 DIAGNOSIS — S39012A Strain of muscle, fascia and tendon of lower back, initial encounter: Secondary | ICD-10-CM

## 2020-12-06 DIAGNOSIS — M5430 Sciatica, unspecified side: Secondary | ICD-10-CM

## 2020-12-06 MED ORDER — DEXAMETHASONE SODIUM PHOSPHATE 10 MG/ML IJ SOLN
10.0000 mg | Freq: Once | INTRAMUSCULAR | Status: AC
  Administered 2020-12-06: 10 mg via INTRAMUSCULAR
  Filled 2020-12-06: qty 1

## 2020-12-06 MED ORDER — DIAZEPAM 5 MG PO TABS: 5.0000 mg | ORAL_TABLET | Freq: Two times a day (BID) | ORAL | 0 refills | Status: DC

## 2020-12-06 MED ORDER — MORPHINE SULFATE (PF) 4 MG/ML IV SOLN
4.0000 mg | Freq: Once | INTRAVENOUS | Status: AC
  Administered 2020-12-06: 4 mg via INTRAMUSCULAR
  Filled 2020-12-06: qty 1

## 2020-12-06 MED ORDER — DIAZEPAM 5 MG PO TABS
5.0000 mg | ORAL_TABLET | Freq: Once | ORAL | Status: AC
  Administered 2020-12-06: 5 mg via ORAL
  Filled 2020-12-06: qty 1

## 2020-12-06 NOTE — Discharge Instructions (Addendum)
Go to [place] [facility].  
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
## 2020-12-06 NOTE — Discharge Instructions (Signed)
You will need to speak to the doctor who prescribes your chronic pain medications to get more narcotics if needed.
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
## 2020-12-07 ENCOUNTER — Telehealth: Payer: Self-pay | Admitting: Family Medicine

## 2020-12-07 ENCOUNTER — Telehealth: Payer: Self-pay | Admitting: *Deleted

## 2020-12-07 LAB — AEROBIC CULTURE W GRAM STAIN (SUPERFICIAL SPECIMEN)

## 2020-12-07 NOTE — Telephone Encounter (Signed)
Will route to PharmD for rec's re: holding anticoagulation. [name], PA-C    12/07/2020 2:26 PM
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
## 2020-12-07 NOTE — Telephone Encounter (Signed)
Pt was given earliest appointment and matters will be discussed at his upcoming visit.

## 2020-12-07 NOTE — Telephone Encounter (Signed)
Patient with diagnosis of atrial fibrillation on Eliquis for anticoagulation.    Procedure: spinal injection Date of procedure: TBD   CHA2DS2-VASc Score = 3  This indicates a 3.2% annual risk of stroke. The patient's score is based upon: CHF History: No HTN History: Yes Diabetes History: No Stroke History: Yes Vascular Disease History: No Age Score: 0 Gender Score: 0    CrCl 106.4 (69.1 with IBW) Platelet count 190  Patient at high risk off anticoagulation due to prior stroke (sometime between 2012-2014) and LA clot (2017).  Needs to have 3 day hold on Eliquis for spinal procedure.  Previous clearance for Myelogram in May was cleared without need for bridging, but will confirm with cardiologist that this is still acceptable.

## 2020-12-07 NOTE — Telephone Encounter (Signed)
   [facility] Pre-operative Risk Assessment    Patient Name: Jason Stokes  DOB: 11/22/1960  MRN: [id number]   HEARTCARE STAFF: - Please ensure there is not already an duplicate clearance open for this procedure. - Under Visit Info/Reason for Call, type in Other and utilize the format Clearance MM/DD/YY or Clearance TBD. Do not use dashes or single digits. - If request is for dental extraction, please clarify the # of teeth to be extracted.  Request for surgical clearance:  1. What type of surgery is being performed? SPINAL INJECTION   2. When is this surgery scheduled? TBD   3. What type of clearance is required (medical clearance vs. Pharmacy clearance to hold med vs. Both)? BOTH  4. Are there any medications that need to be held prior to surgery and how long? ELIQUIS x 3-5 DAYS PRIOR    5. Practice name and name of physician performing surgery? [facility]; DR. Gwenlyn Perking BARTKO   6. What is the office phone number? [phone]   7.   What is the office fax number? [phone]  8.   Anesthesia type (None, local, MAC, general) ? NONE LISTED   Julaine Hua 12/07/2020, 1:13 PM  _________________________________________________________________   (provider comments below)
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
## 2020-12-08 ENCOUNTER — Ambulatory Visit: Payer: Self-pay

## 2020-12-08 ENCOUNTER — Telehealth: Payer: Self-pay | Admitting: Cardiology

## 2020-12-08 ENCOUNTER — Encounter: Payer: Self-pay | Admitting: Family

## 2020-12-08 ENCOUNTER — Ambulatory Visit (INDEPENDENT_AMBULATORY_CARE_PROVIDER_SITE_OTHER): Payer: Medicaid Other | Admitting: Family

## 2020-12-08 ENCOUNTER — Telehealth (HOSPITAL_COMMUNITY): Payer: Self-pay | Admitting: Emergency Medicine

## 2020-12-08 VITALS — Ht 73.0 in | Wt 271.0 lb

## 2020-12-08 DIAGNOSIS — M25461 Effusion, right knee: Secondary | ICD-10-CM

## 2020-12-08 DIAGNOSIS — M25561 Pain in right knee: Secondary | ICD-10-CM | POA: Diagnosis not present

## 2020-12-08 MED ORDER — AMOXICILLIN-POT CLAVULANATE 875-125 MG PO TABS: 1.0000 | ORAL_TABLET | Freq: Two times a day (BID) | ORAL | 0 refills | Status: DC

## 2020-12-08 NOTE — Telephone Encounter (Signed)
Notes faxed to surgeon. This phone note will be removed from the preop pool. [name], PA-C  12/08/2020 4:15 PM

## 2020-12-08 NOTE — Telephone Encounter (Signed)
Pt is returning call from earlier this morning. Please advise
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
## 2020-12-08 NOTE — Telephone Encounter (Signed)
Will forward to pre op provider as pre op provider was trying to reach him yesterday.

## 2020-12-09 ENCOUNTER — Telehealth: Payer: Self-pay | Admitting: Family Medicine

## 2020-12-09 ENCOUNTER — Telehealth: Payer: Self-pay | Admitting: *Deleted

## 2020-12-09 DIAGNOSIS — M542 Cervicalgia: Secondary | ICD-10-CM

## 2020-12-09 MED ORDER — METHYLPREDNISOLONE ACETATE 40 MG/ML IJ SUSP
40.0000 mg | INTRAMUSCULAR | Status: AC | PRN
Start: 2020-12-08 — End: 2020-12-08
  Administered 2020-12-08: 40 mg via INTRA_ARTICULAR

## 2020-12-09 MED ORDER — LIDOCAINE HCL 1 % IJ SOLN
5.0000 mL | INTRAMUSCULAR | Status: AC | PRN
  Administered 2020-12-08: 5 mL

## 2020-12-09 NOTE — Telephone Encounter (Signed)
I do not understand this message.  Is he requesting a referral for physical therapy in the [facility] office or referral to neurology?

## 2020-12-09 NOTE — Telephone Encounter (Signed)
Copied from [place] [phone]. Topic: Referral - Request for Referral >> Dec 09, 2020  9:27 AM Tessa Lerner A wrote: Has patient seen PCP for this complaint? No  *If NO, is insurance requiring patient see PCP for this issue before PCP can refer them?  Referral for which specialty: Physical Therapist  Preferred provider/office: [facility]  Reason for referral: neck pain
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
## 2020-12-09 NOTE — Telephone Encounter (Signed)
Will route to PCP for review. 

## 2020-12-11 ENCOUNTER — Ambulatory Visit (INDEPENDENT_AMBULATORY_CARE_PROVIDER_SITE_OTHER): Payer: Medicaid Other

## 2020-12-11 DIAGNOSIS — J309 Allergic rhinitis, unspecified: Secondary | ICD-10-CM

## 2020-12-15 ENCOUNTER — Telehealth: Payer: Self-pay | Admitting: Cardiology
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
## 2020-12-16 ENCOUNTER — Telehealth: Payer: Self-pay | Admitting: Family Medicine
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
## 2020-12-17 ENCOUNTER — Encounter: Payer: Self-pay | Admitting: Physician Assistant

## 2020-12-17 ENCOUNTER — Ambulatory Visit (INDEPENDENT_AMBULATORY_CARE_PROVIDER_SITE_OTHER): Payer: Medicaid Other | Admitting: Physician Assistant

## 2020-12-17 ENCOUNTER — Other Ambulatory Visit: Payer: Self-pay

## 2020-12-17 VITALS — BP 128/84 | HR 86 | Ht 73.0 in | Wt 262.4 lb

## 2020-12-17 DIAGNOSIS — I480 Paroxysmal atrial fibrillation: Secondary | ICD-10-CM

## 2020-12-17 DIAGNOSIS — R609 Edema, unspecified: Secondary | ICD-10-CM | POA: Diagnosis not present

## 2020-12-17 DIAGNOSIS — I10 Essential (primary) hypertension: Secondary | ICD-10-CM

## 2020-12-17 DIAGNOSIS — R002 Palpitations: Secondary | ICD-10-CM | POA: Diagnosis not present

## 2020-12-17 MED ORDER — CARVEDILOL 25 MG PO TABS: 25.0000 mg | ORAL_TABLET | Freq: Two times a day (BID) | ORAL | 1 refills | Status: DC

## 2020-12-17 MED ORDER — DILTIAZEM HCL ER COATED BEADS 180 MG PO CP24: 180.0000 mg | ORAL_CAPSULE | Freq: Every day | ORAL | 1 refills | Status: DC

## 2020-12-17 NOTE — Telephone Encounter (Signed)
Patient is wanting rehab for his neck pain.

## 2020-12-17 NOTE — Telephone Encounter (Signed)
Patient saw cardiologist today, but received the following CRM:   "Reason for CRM: Patient would like orders for an xray submitted "today" 12/17/20  Patient would like the Xray orders submitted to [facility]   Patient has concerns related to a rib on their left side   Please contact to further advise if needed"

## 2020-12-17 NOTE — Patient Instructions (Signed)
Medication Instructions:   START TAKING COREG 25 MG TWICE A DAY   START TAKING  DILTIAZEM  180 MG ONCE A DAY    *If you need a refill on your cardiac medications before your next appointment, please call your pharmacy*   Lab Work: NONE ORDERED  TODAY   If you have labs (blood work) drawn today and your tests are completely normal, you will receive your results only by: Marland Kitchen MyChart Message (if you have MyChart) OR . A paper copy in the mail If you have any lab test that is abnormal or we need to change your treatment, we will call you to review the results.   Testing/Procedures: Your physician has requested that you have an echocardiogram. Echocardiography is a painless test that uses sound waves to create images of your heart. It provides your doctor with information about the size and shape of your heart and how well your heart's chambers and valves are working. This procedure takes approximately one hour. There are no restrictions for this procedure.  Follow-Up: At [facility], you and your health needs are our priority.  As part of our continuing mission to provide you with exceptional heart care, we have created designated Provider Care Teams.  These Care Teams include your primary Cardiologist (physician) and Advanced Practice Providers (APPs -  Physician Assistants and Nurse Practitioners) who all work together to provide you with the care you need, when you need it.  We recommend signing up for the patient portal called "MyChart".  Sign up information is provided on this After Visit Summary.  MyChart is used to connect with patients for Virtual Visits (Telemedicine).  Patients are able to view lab/test results, encounter notes, upcoming appointments, etc.  Non-urgent messages can be sent to your provider as well.   To learn more about what you can do with MyChart, go to [email or web address].    Your next appointment:   3 MONTHS   The format for your next appointment:   In  Person  Provider:   You will follow up in the [facility] located at [facility]. Your provider will be: Roderic Palau, NP or Clint R. Fenton, PA-C   Other Instructions

## 2020-12-17 NOTE — Telephone Encounter (Signed)
Done

## 2020-12-18 ENCOUNTER — Other Ambulatory Visit (INDEPENDENT_AMBULATORY_CARE_PROVIDER_SITE_OTHER): Payer: Medicaid Other

## 2020-12-18 DIAGNOSIS — I480 Paroxysmal atrial fibrillation: Secondary | ICD-10-CM | POA: Diagnosis not present

## 2020-12-21 ENCOUNTER — Ambulatory Visit (INDEPENDENT_AMBULATORY_CARE_PROVIDER_SITE_OTHER): Payer: Medicaid Other

## 2020-12-21 DIAGNOSIS — J309 Allergic rhinitis, unspecified: Secondary | ICD-10-CM | POA: Diagnosis not present

## 2020-12-21 NOTE — Progress Notes (Signed)
VIALS MADE & EXP 12-21-21

## 2020-12-22 DIAGNOSIS — J301 Allergic rhinitis due to pollen: Secondary | ICD-10-CM

## 2020-12-23 DIAGNOSIS — J3089 Other allergic rhinitis: Secondary | ICD-10-CM | POA: Diagnosis not present
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
## 2020-12-23 NOTE — Telephone Encounter (Signed)
Patient is needing an Xray of his ribs per his cardiologist.

## 2020-12-24 NOTE — Telephone Encounter (Signed)
Pt was called and a VM was left informing patient to return phone call.   If patient returns phone call please inform him of the doctor response below.

## 2020-12-28 ENCOUNTER — Other Ambulatory Visit: Payer: Self-pay

## 2020-12-28 ENCOUNTER — Encounter: Payer: Self-pay | Admitting: Physical Therapy

## 2020-12-28 ENCOUNTER — Ambulatory Visit: Payer: Medicaid Other | Attending: Family Medicine | Admitting: Physical Therapy

## 2020-12-28 DIAGNOSIS — M542 Cervicalgia: Secondary | ICD-10-CM | POA: Diagnosis present

## 2020-12-28 DIAGNOSIS — M545 Low back pain, unspecified: Secondary | ICD-10-CM | POA: Diagnosis present

## 2020-12-28 DIAGNOSIS — R293 Abnormal posture: Secondary | ICD-10-CM | POA: Insufficient documentation

## 2020-12-28 DIAGNOSIS — G8929 Other chronic pain: Secondary | ICD-10-CM | POA: Insufficient documentation

## 2020-12-28 DIAGNOSIS — R29898 Other symptoms and signs involving the musculoskeletal system: Secondary | ICD-10-CM | POA: Diagnosis present

## 2020-12-28 DIAGNOSIS — M6281 Muscle weakness (generalized): Secondary | ICD-10-CM | POA: Insufficient documentation

## 2020-12-28 NOTE — Patient Instructions (Signed)
Access Code: [facility] URL: [email or web address] Date: 12/28/2020 Prepared by: Hilda [address]  Exercises Seated Scapular Retraction - 2-3 x daily - 7 x weekly - 10 reps - 3 hold Seated Cervical Retraction - 2-3 x daily - 7 x weekly - 10 reps - 3 hold Gentle Upper Trap Stretch - 2-3 x daily - 7 x weekly - 10 reps - 3 hold Seated Hamstring Stretch - 2-3 x daily - 7 x weekly - 3 reps - 20 hold Supine Lower Trunk Rotation - 2-3 x daily - 7 x weekly - 10 reps - 5 hold Supine March - 2-3 x daily - 7 x weekly - 10 reps Supine Heel Slide - 2-3 x daily - 7 x weekly - 10 reps

## 2020-12-28 NOTE — Telephone Encounter (Signed)
Pt is requesting pain medication. Patient has been referred to pain management the referral note states that on 12/11/20 he was called and VM was left informing him that new office needed records from [facility], patient has not responded or called back with information.
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
## 2020-12-28 NOTE — Telephone Encounter (Signed)
Patient called in regarding issue. I let him know of provider message. Patient stated that he is in lots of pain and has no pain medication. Patient states If he is unable to get another x-ray he would like to have a refill on pain medication to help him through the healing process. Please advise.

## 2020-12-29 MED ORDER — METHOCARBAMOL 500 MG PO TABS: 500.0000 mg | ORAL_TABLET | Freq: Three times a day (TID) | ORAL | 1 refills | Status: DC | PRN

## 2020-12-29 MED ORDER — LIDOCAINE 5 % EX PTCH: 1.0000 | MEDICATED_PATCH | CUTANEOUS | 1 refills | Status: DC

## 2020-12-29 NOTE — Addendum Note (Signed)
Addended byCharlott Rakes on: 12/29/2020 08:17 AM   Modules accepted: Orders

## 2020-12-29 NOTE — Telephone Encounter (Signed)
Pain medications have been sent to pharmacy.

## 2020-12-31 NOTE — Addendum Note (Signed)
Addended by: Jefferson Fuel on: 12/31/2020 09:38 AM   Modules accepted: Orders

## 2020-12-31 NOTE — Telephone Encounter (Signed)
   Notes to clinic: Patient states that the medication are not working and that  he is in a lot of pain Review for changes    Requested Prescriptions  Pending Prescriptions Disp Refills   Oxycodone HCl 10 MG TABS      Sig: Take 1 tablet (10 mg total) by mouth 4 (four) times daily as needed (Pain).      There is no refill protocol information for this order      methocarbamol (ROBAXIN) 500 MG tablet 30 tablet 1    Sig: Take 1 tablet (500 mg total) by mouth every 8 (eight) hours as needed for muscle spasms.      There is no refill protocol information for this order     Signed Prescriptions Disp Refills   lidocaine (LIDODERM) 5 % 30 patch 1    Sig: Place 1 patch onto the skin daily. Remove & Discard patch within 12 hours or as directed by MD      There is no refill protocol information for this order      methocarbamol (ROBAXIN) 500 MG tablet 30 tablet 1    Sig: Take 1 tablet (500 mg total) by mouth every 8 (eight) hours as needed for muscle spasms.      There is no refill protocol information for this order

## 2020-12-31 NOTE — Telephone Encounter (Signed)
Medication Refill - Medication:   Has the patient contacted their pOxycodone HCl 10 MG TABS harmacy? No.  Pt states he was prescribed the  methocarbamol (ROBAXIN) 500 MG tablet but he stopped taking because it made his feet swell. Pt sates he is in a lot of pain, and the PCP told him to take pain med, but the ones given are not working   Preferred Pharmacy (with phone number or street name): [id number] [place], [place]  Phone:  [phone] Fax:  [phone]  Agent: Please be advised that RX refills may take up to 3 business days. We ask that you follow-up with your pharmacy.

## 2021-01-01 ENCOUNTER — Telehealth: Payer: Self-pay | Admitting: Family Medicine

## 2021-01-01 ENCOUNTER — Ambulatory Visit (INDEPENDENT_AMBULATORY_CARE_PROVIDER_SITE_OTHER): Payer: Medicaid Other

## 2021-01-01 DIAGNOSIS — J309 Allergic rhinitis, unspecified: Secondary | ICD-10-CM
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
## 2021-01-01 NOTE — Addendum Note (Signed)
Addended by: Daisy Blossom, Annie Main L on: 01/01/2021 02:19 PM   Modules accepted: Orders

## 2021-01-04 ENCOUNTER — Other Ambulatory Visit: Payer: Self-pay

## 2021-01-04 ENCOUNTER — Ambulatory Visit (INDEPENDENT_AMBULATORY_CARE_PROVIDER_SITE_OTHER): Payer: Medicaid Other | Admitting: Orthopaedic Surgery

## 2021-01-04 DIAGNOSIS — M25461 Effusion, right knee: Secondary | ICD-10-CM

## 2021-01-04 DIAGNOSIS — M1711 Unilateral primary osteoarthritis, right knee: Secondary | ICD-10-CM
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
## 2021-01-05 NOTE — Telephone Encounter (Signed)
Pt did not receive a call back and is in still a lot of pain, as no pain clinic anymore please respond to [phone].

## 2021-01-06 NOTE — Telephone Encounter (Signed)
Call placed to patient and VM was left.  Patient has been referred to another pain management specialist and they are requiring patient to obtain previous records for pain management.Patient has not obtained records and PCP does not prescribe requesting medication. Patient needs to provide records to new pain management  office.

## 2021-01-07 ENCOUNTER — Ambulatory Visit: Payer: Medicaid Other | Admitting: Physical Therapy

## 2021-01-07 ENCOUNTER — Encounter: Payer: Self-pay | Admitting: Physical Therapy

## 2021-01-07 ENCOUNTER — Other Ambulatory Visit: Payer: Self-pay

## 2021-01-07 DIAGNOSIS — R29898 Other symptoms and signs involving the musculoskeletal system: Secondary | ICD-10-CM

## 2021-01-07 DIAGNOSIS — G8929 Other chronic pain: Secondary | ICD-10-CM

## 2021-01-07 DIAGNOSIS — R293 Abnormal posture: Secondary | ICD-10-CM

## 2021-01-07 DIAGNOSIS — M6281 Muscle weakness (generalized): Secondary | ICD-10-CM

## 2021-01-07 DIAGNOSIS — M542 Cervicalgia: Secondary | ICD-10-CM

## 2021-01-07 DIAGNOSIS — M545 Low back pain, unspecified: Secondary | ICD-10-CM

## 2021-01-07 NOTE — Therapy (Signed)
[place], [place], [address] Phone: [phone]   Fax:  [phone]  Physical Therapy Treatment  Patient Details  Name: Jason Stokes MRN: [id number] Date of Birth: 02-Jul-1961 Referring Provider (PT): Charlott Rakes, MD   Encounter Date: 01/07/2021   PT End of Session - 01/07/21 1127     Visit Number 2    Number of Visits 8    Date for PT Re-Evaluation 02/22/21    Authorization Type Med Pay / MCD    Authorization Time Period 01/07/21-01/27/21    Authorization - Visit Number 1    Authorization - Number of Visits 3    PT Start Time 1962    PT Stop Time 1201    PT Time Calculation (min) 38 min    Behavior During Therapy [facility] for tasks assessed/performed             Past Medical History:  Diagnosis Date   Anxiety    Bilateral carpal tunnel syndrome 01/10/2018   Bipolar disorder ([place])    Chronic lower back pain    Depression    Dysrhythmia    a-fib   GERD (gastroesophageal reflux disease)    History of alcohol abuse    History of nuclear stress test    Myoview 10/16: EF 50%, diaphragmatic attenuation, no ischemia, low risk   Hypertension    Migraine 2012-2014   Moderate persistent asthma with acute exacerbation 05/02/2018   PAF (paroxysmal atrial fibrillation) ([place]) 03/27/2015   a. Myoview neg for ischemia >> Flecainide started 10/16 >> FU ETT    Schizophrenia ([place])    Sciatica neuralgia    Small vessel disease ([place])    Right basal ganglia stroke   Stroke [facility]) "between 2012-2014"   residual "AF" (09/22/2015)    Past Surgical History:  Procedure Laterality Date   ATRIAL FIBRILLATION ABLATION  09/22/2015   CYST EXCISION  1996-97   surgery back of head    ELECTROPHYSIOLOGIC STUDY N/A 09/22/2015   Procedure: Atrial Fibrillation Ablation;  Surgeon: Will [name], MD;  Location: [place] [facility];  Service: Cardiovascular;  Laterality: N/A;   ELECTROPHYSIOLOGIC STUDY N/A 12/10/2015    Procedure: Atrial Fibrillation Ablation;  Surgeon: Will [name], MD;  Location: [place] [facility];  Service: Cardiovascular;  Laterality: N/A;   ELECTROPHYSIOLOGIC STUDY N/A 12/11/2015   Procedure: Cardioversion;  Surgeon: Will [name], MD;  Location: [place] [facility];  Service: Cardiovascular;  Laterality: N/A;   EMBOLIZATION Right 08/19/2020   Procedure: EMBOLIZATION;  Surgeon: Cherre Robins, MD;  Location: [place] [facility];  Service: Cardiovascular;  Laterality: Right;  hypogastric   EXCISION MASS HEAD N/A 01/06/2017   Procedure: EXCISION MASS FOREHEAD;  Surgeon: Irene Limbo, MD;  Location: [place];  Service: Plastics;  Laterality: N/A;   GANGLION CYST EXCISION Left    INTERCOSTAL NERVE BLOCK  2005   KNEE ARTHROSCOPY Right 2016    There were no vitals filed for this visit.   Subjective Assessment - 01/07/21 1125     Subjective The leg is bothering me tingling in the outside of the lower right leg.    Currently in Pain? Yes    Pain Score 8     Pain Location Neck    Pain Orientation Right;Left    Pain Descriptors / Indicators Sore;Sharp    Pain Type Chronic pain    Aggravating Factors  moving neck too quickly    Pain Relieving Factors meds  Pain Score 9    Pain Location Back    Pain Orientation Lower    Pain Descriptors / Indicators Sharp;Aching    Aggravating Factors  sitting prolonged , avoid bending                               [facility] Adult PT Treatment/Exercise - 01/07/21 0001       Neck Exercises: Seated   Neck Retraction 10 reps;3 secs    Neck Retraction Limitations 2 sets    Lateral Flexion 10 reps   3 sec hold   Other Seated Exercise Shoulder blade squeezes 10 x 3 sec hold   2 sets     Lumbar Exercises: Stretches   Active Hamstring Stretch Limitations supine with strap and ankle DF/PF    Passive Hamstring Stretch 2 reps;30 seconds    Passive Hamstring Stretch Limitations seated    Lower Trunk  Rotation 10 seconds    Lower Trunk Rotation Limitations x 10 reps      Lumbar Exercises: Aerobic   Nustep Nustep L 5 UE/LE x 5 minutes      Lumbar Exercises: Supine   Heel Slides 10 reps    Heel Slides Limitations cues for abdominal draw in    Bent Knee Raise 10 reps    Bent Knee Raise Limitations Cues for abdominal draw in    Large Ball Abdominal Isometric 10 reps    Large Ball Abdominal Isometric Limitations 5 second hold                      PT Short Term Goals - 12/28/20 1558       PT SHORT TERM GOAL #1   Title Patient will be I with initial HEP to progress with PT    Baseline HEP provided at evaluation    Time 4    Period Weeks    Status New    Target Date 01/25/21      PT SHORT TERM GOAL #2   Title Patient will report </= 6/10 pain to improve mobility and performance of household tasks    Baseline patient reports 9/10 pain at rest    Time 4    Period Weeks    Status New    Target Date 01/25/21      PT SHORT TERM GOAL #3   Title Patient will demonstrate 5 deg improvement in all planes of cervical motion to indicate reduced muscular tension and pain    Baseline Cervical flexion 20 deg, extension 15 deg, right rotation 45 deg, left rotation 60 deg, right sidebend 10, left sidebend 20 deg    Time 4    Period Weeks    Status New    Target Date 01/25/21               PT Long Term Goals - 12/28/20 1603       PT LONG TERM GOAL #1   Title Patient will be I with final HEP to maintain progress from PT    Baseline HEP provided at evaluation    Time 8    Period Weeks    Status New    Target Date 02/22/21      PT LONG TERM GOAL #2   Title Patient will report </= 4/10 pain level with household activity and ambulation to reduce functional limitation    Baseline 9/10 pain level at evalaution    Time 8  Period Weeks    Status New    Target Date 02/22/21      PT LONG TERM GOAL #3   Title Patient will be able to walk >/= 30 minutes without  requiring rest due to pain in order to improve community access    Baseline patient reports 10-15 min of walking before needing break due to pain    Time 8    Period Weeks    Status New    Target Date 02/22/21      PT LONG TERM GOAL #4   Title Patient will be able to sit >/= 45 minutes without having to change positions to improve leisure    Baseline patient reports typically 20-30 minutes before getting up do to pain    Time 8    Period Weeks    Status New    Target Date 02/22/21      PT LONG TERM GOAL #5   Title Patient will demonstrate improved strength >/= 4+/5 MMT throughout BLE and BUE in order to improve performance of household tasks    Baseline patient exhibits strenth grossly </= 4/5 MMT throughout BLE and BUE    Time 8    Period Weeks    Status New    Target Date 02/22/21                   Plan - 01/07/21 1130     Clinical Impression Statement Pt arrives 20 minutes late and is able to be seen for full treatment today. He reports injection last week was beneficial briefly and then pain retruned when he started exercises. He complains mostly of right lower leg pain but also reports 9/10 neck and back pain. Began Nustep  and spent session reviewing HEP and progressing with core activation and Le nerve glides. He required min cues for abdominal draw in and breathing. He reports daily compliance with HEP. After session, pt reported burning in his leg had resolved.    PT Next Visit Plan Review HEP and progress PRN, manual for neck and lower back as tolerated to improve motion and reduce pain, progress light mobility and strengthening for posture and core    PT Home Exercise Plan [facility]    Consulted and Agree with Plan of Care Patient             Patient will benefit from skilled therapeutic intervention in order to improve the following deficits and impairments:  Abnormal gait, Decreased range of motion, Difficulty walking, Decreased activity tolerance, Increased  muscle spasms, Pain, Decreased balance, Impaired flexibility, Postural dysfunction, Improper body mechanics, Decreased strength, Impaired sensation  Visit Diagnosis: Cervicalgia  Chronic bilateral low back pain, unspecified whether sciatica present  Muscle weakness (generalized)  Abnormal posture  Other symptoms and signs involving the musculoskeletal system     Problem List Patient Active Problem List   Diagnosis Date Noted   Neck pain on left side 03/06/2019   Musculoskeletal chest pain 07/24/2018   Asthma, mild intermittent 05/02/2018   Allergic rhinitis caused by mold 05/02/2018   Tobacco use 05/02/2018   Bilateral carpal tunnel syndrome 01/10/2018   Stroke (HCC)    Schizophrenia (HCC)    Migraine    History of nuclear stress test    History of alcohol abuse    GERD (gastroesophageal reflux disease)    Dysrhythmia    Chronic lower back pain    Anxiety    Arthritis of knee, right 04/12/2017   Bipolar disorder ([place]) 04/03/2017  Lipoma of forehead 09/22/2016   Trigger ring finger of right hand 06/22/2016   Paroxysmal atrial fibrillation (HCC)    AF (atrial fibrillation) ([place]) 12/10/2015   Eczema 07/10/2015   History of CVA (cerebrovascular accident) 05/04/2015   Tear of medial meniscus of right knee 03/18/2015   Chronic pain of right knee 03/12/2015   Other and unspecified hyperlipidemia 11/25/2013   Nasal congestion 11/25/2013   Sinusitis, chronic 05/09/2013   Chronic low back pain 10/12/2012   Lumbar radiculopathy 10/12/2012   Hypertension 10/12/2012   Depression 10/12/2012   Insomnia 10/12/2012    Dorene Ar, PTA 01/07/2021, 12:23 PM  [facility] [address] [place], [place], [address] Phone: [phone]   Fax:  [phone]  Name: Jason Stokes MRN: [id number] Date of Birth: July 21, 1960

## 2021-01-08 ENCOUNTER — Ambulatory Visit (INDEPENDENT_AMBULATORY_CARE_PROVIDER_SITE_OTHER): Payer: Medicaid Other

## 2021-01-08 DIAGNOSIS — J309 Allergic rhinitis, unspecified: Secondary | ICD-10-CM | POA: Diagnosis not present

## 2021-01-12 ENCOUNTER — Encounter: Payer: Self-pay | Admitting: Physical Therapy

## 2021-01-12 ENCOUNTER — Ambulatory Visit: Payer: Medicaid Other | Admitting: Physical Therapy

## 2021-01-12 ENCOUNTER — Other Ambulatory Visit: Payer: Self-pay

## 2021-01-12 DIAGNOSIS — M542 Cervicalgia: Secondary | ICD-10-CM

## 2021-01-12 DIAGNOSIS — G8929 Other chronic pain: Secondary | ICD-10-CM

## 2021-01-12 DIAGNOSIS — M6281 Muscle weakness (generalized): Secondary | ICD-10-CM

## 2021-01-12 NOTE — Therapy (Signed)
Jason Stokes, [place], [address] Phone: [phone]   Fax:  [phone]  Physical Therapy Treatment  Patient Details  Name: Jason Stokes MRN: [id number] Date of Birth: 08-01-1960 Referring Provider (PT): Charlott Rakes, MD   Encounter Date: 01/12/2021   PT End of Session - 01/12/21 1057     Visit Number 3    Number of Visits 8    Date for PT Re-Evaluation 02/22/21    Authorization Type Med [employer] / MCD    Authorization Time Period 01/07/21-01/27/21    Authorization - Visit Number 2    Authorization - Number of Visits 3    PT Start Time 8638    PT Stop Time 1130    PT Time Calculation (min) 39 min    Activity Tolerance Patient limited by pain    Behavior During Therapy [facility] for tasks assessed/performed             Past Medical History:  Diagnosis Date   Anxiety    Bilateral carpal tunnel syndrome 01/10/2018   Bipolar disorder ([place])    Chronic lower back pain    Depression    Dysrhythmia    a-fib   GERD (gastroesophageal reflux disease)    History of alcohol abuse    History of nuclear stress test    Myoview 10/16: EF 50%, diaphragmatic attenuation, no ischemia, low risk   Hypertension    Migraine 2012-2014   Moderate persistent asthma with acute exacerbation 05/02/2018   PAF (paroxysmal atrial fibrillation) ([place]) 03/27/2015   a. Myoview neg for ischemia >> Flecainide started 10/16 >> FU ETT    Schizophrenia ([place])    Sciatica neuralgia    Small vessel disease ([place])    Right basal ganglia stroke   Stroke [facility]) "between 2012-2014"   residual "AF" (09/22/2015)    Past Surgical History:  Procedure Laterality Date   ATRIAL FIBRILLATION ABLATION  09/22/2015   CYST EXCISION  1996-97   surgery back of head    ELECTROPHYSIOLOGIC STUDY N/A 09/22/2015   Procedure: Atrial Fibrillation Ablation;  Surgeon: Will [name], MD;  Location: [place] [facility];  Service: Cardiovascular;  Laterality: N/A;    ELECTROPHYSIOLOGIC STUDY N/A 12/10/2015   Procedure: Atrial Fibrillation Ablation;  Surgeon: Will [name], MD;  Location: [place] [facility];  Service: Cardiovascular;  Laterality: N/A;   ELECTROPHYSIOLOGIC STUDY N/A 12/11/2015   Procedure: Cardioversion;  Surgeon: Will [name], MD;  Location: [place] [facility];  Service: Cardiovascular;  Laterality: N/A;   EMBOLIZATION Right 08/19/2020   Procedure: EMBOLIZATION;  Surgeon: Cherre Robins, MD;  Location: [place] [facility];  Service: Cardiovascular;  Laterality: Right;  hypogastric   EXCISION MASS HEAD N/A 01/06/2017   Procedure: EXCISION MASS FOREHEAD;  Surgeon: Irene Limbo, MD;  Location: [place];  Service: Plastics;  Laterality: N/A;   GANGLION CYST EXCISION Left    INTERCOSTAL NERVE BLOCK  2005   KNEE ARTHROSCOPY Right 2016    There were no vitals filed for this visit.   Subjective Assessment - 01/12/21 1054     Subjective Patient reports he feels like he is better, states he feels like he has more movement on the right side. Patient does continue to report 8-10/10 pain level but states he is no longer having the sharp shooting pains in the neck, but still has the right leg pain.    Patient Stated Goals Patient wants to be able to move better  with less pain    Currently in Pain? Yes    Pain Score 8     Pain Location Neck    Pain Orientation Right;Left    Pain Descriptors / Indicators Sharp;Shooting    Pain Type Chronic pain    Pain Onset More than a month ago    Pain Frequency Constant    Pain Score 10    Pain Location Back    Pain Orientation Lower    Pain Descriptors / Indicators Sharp    Pain Type Chronic pain    Pain Onset More than a month ago    Pain Frequency Constant                [facility] PT Assessment - 01/12/21 0001       AROM   Cervical - Right Side Bend 15    Cervical - Left Side Bend 25    Cervical - Right Rotation 45    Cervical - Left Rotation 70                            [facility] Adult PT Treatment/Exercise - 01/12/21 0001       Exercises   Exercises Neck;Lumbar      Neck Exercises: Theraband   Rows 15 reps;Red    Rows Limitations seated    Shoulder External Rotation 15 reps;Red    Shoulder External Rotation Limitations double er + scap retraction    Horizontal ABduction 15 reps;Red    Horizontal ABduction Limitations seated      Neck Exercises: Seated   Neck Retraction 10 reps;3 secs    Shoulder Rolls 15 reps      Lumbar Exercises: Stretches   Passive Hamstring Stretch 2 reps;30 seconds    Passive Hamstring Stretch Limitations seated egde of mat    Lower Trunk Rotation 5 reps;10 seconds    Piriformis Stretch 2 reps;60 seconds    Piriformis Stretch Limitations hooklying figure-4 PROM      Lumbar Exercises: Aerobic   Nustep L5 x 5 min with UE/LE while taking subjective      Lumbar Exercises: Supine   Bent Knee Raise 10 reps   2 sets   Bent Knee Raise Limitations cued for abdominal engagement    Straight Leg Raise 10 reps    Straight Leg Raises Limitations cued for abdominal engagement      Neck Exercises: Stretches   Upper Trapezius Stretch 2 reps;20 seconds                    PT Education - 01/12/21 1057     Education Details HEP update    Person(s) Educated Patient    Methods Explanation;Demonstration;Verbal cues;Handout    Comprehension Verbalized understanding;Returned demonstration;Need further instruction;Verbal cues required              PT Short Term Goals - 12/28/20 1558       PT SHORT TERM GOAL #1   Title Patient will be I with initial HEP to progress with PT    Baseline HEP provided at evaluation    Time 4    Period Weeks    Status New    Target Date 01/25/21      PT SHORT TERM GOAL #2   Title Patient will report </= 6/10 pain to improve mobility and performance of household tasks    Baseline patient reports 9/10 pain at rest    Time 4  Period Weeks    Status New     Target Date 01/25/21      PT SHORT TERM GOAL #3   Title Patient will demonstrate 5 deg improvement in all planes of cervical motion to indicate reduced muscular tension and pain    Baseline Cervical flexion 20 deg, extension 15 deg, right rotation 45 deg, left rotation 60 deg, right sidebend 10, left sidebend 20 deg    Time 4    Period Weeks    Status New    Target Date 01/25/21               PT Long Term Goals - 12/28/20 1603       PT LONG TERM GOAL #1   Title Patient will be I with final HEP to maintain progress from PT    Baseline HEP provided at evaluation    Time 8    Period Weeks    Status New    Target Date 02/22/21      PT LONG TERM GOAL #2   Title Patient will report </= 4/10 pain level with household activity and ambulation to reduce functional limitation    Baseline 9/10 pain level at evalaution    Time 8    Period Weeks    Status New    Target Date 02/22/21      PT LONG TERM GOAL #3   Title Patient will be able to walk >/= 30 minutes without requiring rest due to pain in order to improve community access    Baseline patient reports 10-15 min of walking before needing break due to pain    Time 8    Period Weeks    Status New    Target Date 02/22/21      PT LONG TERM GOAL #4   Title Patient will be able to sit >/= 45 minutes without having to change positions to improve leisure    Baseline patient reports typically 20-30 minutes before getting up do to pain    Time 8    Period Weeks    Status New    Target Date 02/22/21      PT LONG TERM GOAL #5   Title Patient will demonstrate improved strength >/= 4+/5 MMT throughout BLE and BUE in order to improve performance of household tasks    Baseline patient exhibits strenth grossly </= 4/5 MMT throughout BLE and BUE    Time 8    Period Weeks    Status New    Target Date 02/22/21                   Plan - 01/12/21 1059     Clinical Impression Statement Patient continues to be limited by low  back/right leg and neck pain but did report slight improvement with therapy. He was able to progress with stretching and light postural and core strengthening this visit. Able to add banded resistance for postural strengthening in seated position. Progressed HEP this visit. He would benefit from continued skilled PT to progress his mobility and strength in order to reduce pain and maximize functional ability with walking and ADLs.    PT Treatment/Interventions ADLs/Self Care Home Management;Aquatic Therapy;Cryotherapy;Electrical Stimulation;Iontophoresis 4mg /ml Dexamethasone;Moist Heat;Traction;Ultrasound;Neuromuscular re-education;Balance training;Therapeutic exercise;Therapeutic activities;Functional mobility training;Stair training;Gait training;Patient/family education;Manual techniques;Dry needling;Passive range of motion;Taping;Vasopneumatic Device;Spinal Manipulations;Joint Manipulations    PT Next Visit Plan Review HEP and progress PRN, manual for neck and lower back as tolerated to improve motion and reduce pain, progress light mobility and  strengthening for posture and core    PT Home Exercise Plan [facility]    Consulted and Agree with Plan of Care Patient             Patient will benefit from skilled therapeutic intervention in order to improve the following deficits and impairments:  Abnormal gait, Decreased range of motion, Difficulty walking, Decreased activity tolerance, Increased muscle spasms, Pain, Decreased balance, Impaired flexibility, Postural dysfunction, Improper body mechanics, Decreased strength, Impaired sensation  Visit Diagnosis: Cervicalgia  Chronic bilateral low back pain, unspecified whether sciatica present  Muscle weakness (generalized)     Problem List Patient Active Problem List   Diagnosis Date Noted   Neck pain on left side 03/06/2019   Musculoskeletal chest pain 07/24/2018   Asthma, mild intermittent 05/02/2018   Allergic rhinitis caused by mold  05/02/2018   Tobacco use 05/02/2018   Bilateral carpal tunnel syndrome 01/10/2018   Stroke ([place])    Schizophrenia ([facility])    Migraine    History of nuclear stress test    History of alcohol abuse    GERD (gastroesophageal reflux disease)    Dysrhythmia    Chronic lower back pain    Anxiety    Arthritis of knee, right 04/12/2017   Bipolar disorder ([place]) 04/03/2017   Lipoma of forehead 09/22/2016   Trigger ring finger of right hand 06/22/2016   Paroxysmal atrial fibrillation ([facility])    AF (atrial fibrillation) ([place]) 12/10/2015   Eczema 07/10/2015   History of CVA (cerebrovascular accident) 05/04/2015   Tear of medial meniscus of right knee 03/18/2015   Chronic pain of right knee 03/12/2015   Other and unspecified hyperlipidemia 11/25/2013   Nasal congestion 11/25/2013   Sinusitis, chronic 05/09/2013   Chronic low back pain 10/12/2012   Lumbar radiculopathy 10/12/2012   Hypertension 10/12/2012   Depression 10/12/2012   Insomnia 10/12/2012    Hilda Blades, PT, DPT, LAT, ATC 01/12/21  1:13 PM Phone: [phone] Fax: [facility] [address] [place], [place], [address] Phone: [phone]   Fax:  [phone]  Name: Jason Stokes MRN: [id number] Date of Birth: 1960/10/29

## 2021-01-12 NOTE — Patient Instructions (Signed)
Access Code: [facility] URL: [email or web address] Date: 01/12/2021 Prepared by: Hilda Blades  Exercises Seated Scapular Retraction - 1-2 x daily - 7 x weekly - 10 reps - 3 hold Seated Cervical Retraction - 1-2 x daily - 7 x weekly - 10 reps - 3 hold Gentle Upper Trap Stretch - 1-2 x daily - 7 x weekly - 10 reps - 3 hold Seated Hamstring Stretch - 1-2 x daily - 7 x weekly - 3 reps - 20 hold Banded Row - 1-2 x daily - 7 x weekly - 2 sets - 15 reps Supine Lower Trunk Rotation - 1-2 x daily - 7 x weekly - 10 reps - 5 hold Supine Piriformis Stretch with Foot on Ground - 1-2 x daily - 7 x weekly - 3 reps - 30 hold Supine March - 1-2 x daily - 7 x weekly - 10 reps Supine Heel Slide - 1-2 x daily - 7 x weekly - 10 reps

## 2021-01-13 ENCOUNTER — Ambulatory Visit (HOSPITAL_COMMUNITY): Payer: Medicaid Other | Attending: Internal Medicine

## 2021-01-13 DIAGNOSIS — R609 Edema, unspecified: Secondary | ICD-10-CM | POA: Insufficient documentation

## 2021-01-13 DIAGNOSIS — R600 Localized edema: Secondary | ICD-10-CM | POA: Diagnosis not present

## 2021-01-13 DIAGNOSIS — R002 Palpitations: Secondary | ICD-10-CM

## 2021-01-13 DIAGNOSIS — I77810 Thoracic aortic ectasia: Secondary | ICD-10-CM

## 2021-01-13 HISTORY — DX: Thoracic aortic ectasia: I77.810

## 2021-01-13 LAB — ECHOCARDIOGRAM COMPLETE
Area-P 1/2: 4.1 cm2
S' Lateral: 3.2 cm

## 2021-01-14 ENCOUNTER — Other Ambulatory Visit: Payer: Self-pay

## 2021-01-14 ENCOUNTER — Encounter: Payer: Self-pay | Admitting: Physician Assistant

## 2021-01-14 ENCOUNTER — Encounter (HOSPITAL_BASED_OUTPATIENT_CLINIC_OR_DEPARTMENT_OTHER): Payer: Self-pay | Admitting: Plastic Surgery

## 2021-01-14 ENCOUNTER — Ambulatory Visit: Payer: Medicaid Other | Attending: Physician Assistant | Admitting: Physician Assistant

## 2021-01-14 ENCOUNTER — Ambulatory Visit (INDEPENDENT_AMBULATORY_CARE_PROVIDER_SITE_OTHER): Payer: Medicaid Other | Admitting: *Deleted

## 2021-01-14 DIAGNOSIS — M5441 Lumbago with sciatica, right side: Secondary | ICD-10-CM | POA: Diagnosis not present

## 2021-01-14 DIAGNOSIS — S2232XD Fracture of one rib, left side, subsequent encounter for fracture with routine healing: Secondary | ICD-10-CM | POA: Diagnosis not present

## 2021-01-14 DIAGNOSIS — J309 Allergic rhinitis, unspecified: Secondary | ICD-10-CM | POA: Diagnosis not present

## 2021-01-14 DIAGNOSIS — G8929 Other chronic pain: Secondary | ICD-10-CM

## 2021-01-14 NOTE — Progress Notes (Signed)
Called and left voice mail with Audree Bane, Dr.Thimmappa's scheduler requesting for cards clearance and recommendations on his Eliquis.

## 2021-01-14 NOTE — Progress Notes (Signed)
Patient ID: Jason Stokes, male   DOB: [birth year], 60 y.o.   MRN: [id number] Virtual Visit via Telephone Note  I connected with Jason Stokes on 01/14/21 at  1:50 PM EDT by telephone and verified that I am speaking with the correct person using two identifiers.  Location: Patient: home Provider: [facility] office   I discussed the limitations, risks, security and privacy concerns of performing an evaluation and management service by telephone and the availability of in person appointments. I also discussed with the patient that there may be a patient responsible charge related to this service. The patient expressed understanding and agreed to proceed.   History of Present Illness:  patient requesting oxycodone for a rib fracture from more than one month ago.  He says he broke his 10th L rib.  No SOB.  PCP just referred to another pain clinic.  I did offer him a few tylenol #3 or tramadol which he refused.  He does not need rf of other meds.  Has appt with PCP 7/27 for chronic conditions    Observations/Objective:  NAD.  Angry affect bc I won't Rx oxycodone   Assessment and Plan: 1. Chronic midline low back pain with right-sided sciatica Pain management referral already done  2. Closed fracture of one rib of left side with routine healing, subsequent encounter He declined tylenol#3 or tramadol.   - DG Ribs Unilateral Left; Future    Follow Up Instructions: See PCP as planned 7/27   I discussed the assessment and treatment plan with the patient. The patient was provided an opportunity to ask questions and all were answered. The patient agreed with the plan and demonstrated an understanding of the instructions.   The patient was advised to call back or seek an in-person evaluation if the symptoms worsen or if the condition fails to improve as anticipated.  I provided 14 minutes of non-face-to-face time during this encounter.   Freeman Caldron, PA-C

## 2021-01-19 ENCOUNTER — Other Ambulatory Visit: Payer: Self-pay | Admitting: Physician Assistant

## 2021-01-19 ENCOUNTER — Ambulatory Visit
Admission: RE | Admit: 2021-01-19 | Discharge: 2021-01-19 | Disposition: A | Discharge status: Home / Self Care | Payer: Self-pay | Source: Ambulatory Visit | Attending: Physician Assistant | Admitting: Physician Assistant

## 2021-01-19 ENCOUNTER — Other Ambulatory Visit: Payer: Self-pay

## 2021-01-19 DIAGNOSIS — S2232XD Fracture of one rib, left side, subsequent encounter for fracture with routine healing: Secondary | ICD-10-CM

## 2021-01-19 NOTE — Progress Notes (Signed)
Abery at Dr Para Skeans office notified again of need for cardiac clearance and Eliquis orders for patient scheduled for surgery 01-25-21.

## 2021-01-21 ENCOUNTER — Other Ambulatory Visit: Payer: Self-pay

## 2021-01-21 ENCOUNTER — Encounter: Payer: Self-pay | Admitting: Physical Therapy

## 2021-01-21 ENCOUNTER — Ambulatory Visit: Payer: Medicaid Other | Attending: Family Medicine | Admitting: Physical Therapy

## 2021-01-21 DIAGNOSIS — M6281 Muscle weakness (generalized): Secondary | ICD-10-CM | POA: Diagnosis present

## 2021-01-21 DIAGNOSIS — M542 Cervicalgia: Secondary | ICD-10-CM

## 2021-01-21 DIAGNOSIS — G8929 Other chronic pain: Secondary | ICD-10-CM | POA: Insufficient documentation

## 2021-01-21 DIAGNOSIS — M545 Low back pain, unspecified: Secondary | ICD-10-CM | POA: Diagnosis present

## 2021-01-21 NOTE — Therapy (Signed)
[place], [place], [address] Phone: [phone]   Fax:  [phone]  Physical Therapy Treatment  Patient Details  Name: Jason Stokes MRN: [id number] Date of Birth: Jan 05, 1961 Referring Provider (PT): Charlott Rakes, MD   Encounter Date: 01/21/2021   PT End of Session - 01/21/21 1203     Visit Number 4    Number of Visits 8    Date for PT Re-Evaluation 02/22/21    Authorization Type Med Pay / MCD    Authorization Time Period 01/07/21-01/27/21    Authorization - Visit Number 3    Authorization - Number of Visits 3    PT Start Time 6144    PT Stop Time 1215    PT Time Calculation (min) 44 min    Activity Tolerance Patient limited by pain;Patient tolerated treatment well    Behavior During Therapy [facility] for tasks assessed/performed             Past Medical History:  Diagnosis Date   Anxiety    Bilateral carpal tunnel syndrome 01/10/2018   Bipolar disorder ([place])    Chronic lower back pain    Depression    Dysrhythmia    a-fib   GERD (gastroesophageal reflux disease)    History of alcohol abuse    History of nuclear stress test    Myoview 10/16: EF 50%, diaphragmatic attenuation, no ischemia, low risk   Hypertension    Migraine 2012-2014   Moderate persistent asthma with acute exacerbation 05/02/2018   PAF (paroxysmal atrial fibrillation) ([place]) 03/27/2015   a. Myoview neg for ischemia >> Flecainide started 10/16 >> FU ETT    Schizophrenia ([place])    Sciatica neuralgia    Small vessel disease ([place])    Right basal ganglia stroke   Stroke [facility]) "between 2012-2014"   residual "AF" (09/22/2015)    Past Surgical History:  Procedure Laterality Date   ATRIAL FIBRILLATION ABLATION  09/22/2015   CYST EXCISION  1996-97   surgery back of head    ELECTROPHYSIOLOGIC STUDY N/A 09/22/2015   Procedure: Atrial Fibrillation Ablation;  Surgeon: Will [name], MD;  Location: [place] [facility];  Service:  Cardiovascular;  Laterality: N/A;   ELECTROPHYSIOLOGIC STUDY N/A 12/10/2015   Procedure: Atrial Fibrillation Ablation;  Surgeon: Will [name], MD;  Location: [place] [facility];  Service: Cardiovascular;  Laterality: N/A;   ELECTROPHYSIOLOGIC STUDY N/A 12/11/2015   Procedure: Cardioversion;  Surgeon: Will [name], MD;  Location: [place] [facility];  Service: Cardiovascular;  Laterality: N/A;   EMBOLIZATION Right 08/19/2020   Procedure: EMBOLIZATION;  Surgeon: Cherre Robins, MD;  Location: [place] [facility];  Service: Cardiovascular;  Laterality: Right;  hypogastric   EXCISION MASS HEAD N/A 01/06/2017   Procedure: EXCISION MASS FOREHEAD;  Surgeon: Irene Limbo, MD;  Location: [place];  Service: Plastics;  Laterality: N/A;   GANGLION CYST EXCISION Left    INTERCOSTAL NERVE BLOCK  2005   KNEE ARTHROSCOPY Right 2016    There were no vitals filed for this visit.   Subjective Assessment - 01/21/21 1136     Subjective Patient reports he is doing a little better and states he has done a little bit more walking recently. States he feels he may have a little more neck range of motion.    How long can you sit comfortably? 20-30 minutes    How long can you stand comfortably? 15-20 minutes    How long can  you walk comfortably? 15-20 minutes    Patient Stated Goals Patient wants to be able to move better with less pain    Currently in Pain? Yes    Pain Score 8     Pain Location Neck    Pain Orientation Right;Left    Pain Descriptors / Indicators Sharp;Shooting    Pain Type Chronic pain    Pain Onset More than a month ago    Pain Frequency Constant    Pain Score 9    Pain Location Back    Pain Orientation Lower    Pain Descriptors / Indicators Sharp    Pain Type Chronic pain    Pain Onset More than a month ago    Pain Frequency Constant                [facility] PT Assessment - 01/21/21 0001       Assessment   Medical Diagnosis Neck pain    Referring  Provider (PT) Charlott Rakes, MD    Onset Date/Surgical Date 12/04/20      Precautions   Precautions None      Restrictions   Weight Bearing Restrictions No      Prior Function   Level of Independence Independent with basic ADLs;Independent with household mobility with device;Independent with community mobility with device      Observation/Other Assessments   Observations Patient appears in no apparent distress    Focus on Therapeutic Outcomes (FOTO)  NA - MCD      AROM   Cervical Flexion 30    Cervical Extension 35    Cervical - Right Side Bend 25    Cervical - Left Side Bend 40    Cervical - Right Rotation 50    Cervical - Left Rotation 70      Strength   Right Shoulder Flexion 4+/5    Right Shoulder ABduction 4/5    Right Shoulder External Rotation 4+/5    Left Shoulder Flexion 4+/5    Left Shoulder ABduction 4/5    Left Shoulder External Rotation 4+/5    Right Hip Flexion 4-/5    Right Hip Extension 3/5    Right Hip ABduction 3/5    Left Hip Flexion 4-/5    Left Hip Extension 3/5    Left Hip ABduction 3/5    Right Knee Flexion 4/5    Right Knee Extension 4/5    Left Knee Flexion 4/5    Left Knee Extension 4/5                           [facility] PT Treatment/Exercise - 01/21/21 0001       Exercises   Exercises Neck;Lumbar      Neck Exercises: Theraband   Shoulder External Rotation 15 reps;Red   2 sets   Shoulder External Rotation Limitations seated double er + scap retraction    Horizontal ABduction 15 reps   2 sets   Horizontal ABduction Limitations supine    Other Theraband Exercises Supine diagonals with red x 15 each      Neck Exercises: Supine   Neck Retraction 10 reps;5 secs      Lumbar Exercises: Stretches   Lower Trunk Rotation 5 reps;10 seconds      Lumbar Exercises: Aerobic   Nustep L5 x 5 min with UE/LE while taking subjective      Lumbar Exercises: Supine   Bridge 5 reps   2 sets  Straight Leg Raise 10 reps   2  sets   Straight Leg Raises Limitations cued for abdominal engagement    Large Ball Abdominal Isometric 10 reps;5 seconds      Manual Therapy   Manual Therapy Myofascial release;Passive ROM    Myofascial Release Suboccipital released with gentle cervical traction    Passive ROM Passive cervical retraction, passive upper trap stretch                    PT Education - 01/21/21 1139     Education Details HEP update    Person(s) Educated Patient    Methods Explanation;Verbal cues;Demonstration;Handout    Comprehension Verbalized understanding;Returned demonstration;Verbal cues required;Need further instruction              PT Short Term Goals - 01/21/21 1204       PT SHORT TERM GOAL #1   Title Patient will be I with initial HEP to progress with PT    Baseline patient is independent with initial HEP    Time 4    Period Weeks    Status Achieved    Target Date 01/25/21      PT SHORT TERM GOAL #2   Title Patient will report </= 6/10 pain to improve mobility and performance of household tasks    Baseline patient continues to report 8-9/10 pain    Time 4    Period Weeks    Status On-going    Target Date 01/25/21      PT SHORT TERM GOAL #3   Title Patient will demonstrate 5 deg improvement in all planes of cervical motion to indicate reduced muscular tension and pain    Baseline Cervical flexion 20 -> 30 deg, extension 15 -> 35 deg, right rotation 45 -> 50 deg, left rotation 60 -> 70 deg, right sidebend 10 -> 25 deg, left sidebend 20 -> 40 deg    Time 4    Period Weeks    Status Achieved    Target Date 01/25/21               PT Long Term Goals - 01/21/21 1237       PT LONG TERM GOAL #1   Title Patient will be I with final HEP to maintain progress from PT    Baseline patient is progressing toward final HEP    Time 8    Period Weeks    Status On-going    Target Date 02/22/21      PT LONG TERM GOAL #2   Title Patient will report </= 4/10 pain level  with household activity and ambulation to reduce functional limitation    Baseline patient continues to report 8-9/10 pain levels    Time 8    Period Weeks    Status On-going    Target Date 02/22/21      PT LONG TERM GOAL #3   Title Patient will be able to walk >/= 30 minutes without requiring rest due to pain in order to improve community access    Baseline patient reports 15-20 min of walking before needing break due to pain    Time 8    Period Weeks    Status On-going    Target Date 02/22/21      PT LONG TERM GOAL #4   Title Patient will be able to sit >/= 45 minutes without having to change positions to improve leisure    Baseline patient reports typically 30 minutes before getting  up do to pain    Time 8    Period Weeks    Status On-going    Target Date 02/22/21      PT LONG TERM GOAL #5   Title Patient will demonstrate improved strength >/= 4+/5 MMT throughout BLE and BUE in order to improve performance of household tasks    Baseline patient continues to exhibit strenth deficit grossly </= 4+/5 MMT throughout BLE and BUE    Time 8    Period Weeks    Status On-going    Target Date 02/22/21                   Plan - 01/21/21 1226     Clinical Impression Statement Patient continues to report high baseline pain level but was able to tolerate therapy much better this visit and progress with strengthening and mobility exercises. He demonstrates great improvement in his cervical range of motion, achieving his STG for motion, and is tolerating progression in his HEP for neck and lower back exercise. Manual therapy used this visit to reduce cervical pain/tension and improve cervical motion, patient reporting improvement in symptoms following manual therapy. Overall patient is progressing well with therapy and progressing toward established LTGs. He would benefit from continued skilled PT to progress his mobility and strength in order to reduce pain and maximize functional  ability with walking and ADLs.    PT Frequency 1x / week    PT Duration 8 weeks    PT Treatment/Interventions ADLs/Self Care Home Management;Aquatic Therapy;Cryotherapy;Electrical Stimulation;Iontophoresis 4mg /ml Dexamethasone;Moist Heat;Traction;Ultrasound;Neuromuscular re-education;Balance training;Therapeutic exercise;Therapeutic activities;Functional mobility training;Stair training;Gait training;Patient/family education;Manual techniques;Dry needling;Passive range of motion;Taping;Vasopneumatic Device;Spinal Manipulations;Joint Manipulations    PT Next Visit Plan Review HEP and progress PRN, manual for neck and lower back as tolerated to improve motion and reduce pain, progress light mobility and strengthening for posture and core    PT Home Exercise Plan [facility]    Consulted and Agree with Plan of Care Patient             Patient will benefit from skilled therapeutic intervention in order to improve the following deficits and impairments:  Abnormal gait, Decreased range of motion, Difficulty walking, Decreased activity tolerance, Increased muscle spasms, Pain, Decreased balance, Impaired flexibility, Postural dysfunction, Improper body mechanics, Decreased strength, Impaired sensation  Visit Diagnosis: Cervicalgia  Chronic bilateral low back pain, unspecified whether sciatica present  Muscle weakness (generalized)     Problem List Patient Active Problem List   Diagnosis Date Noted   Neck pain on left side 03/06/2019   Musculoskeletal chest pain 07/24/2018   Asthma, mild intermittent 05/02/2018   Allergic rhinitis caused by mold 05/02/2018   Tobacco use 05/02/2018   Bilateral carpal tunnel syndrome 01/10/2018   Stroke ([place])    Schizophrenia ([facility])    Migraine    History of nuclear stress test    History of alcohol abuse    GERD (gastroesophageal reflux disease)    Dysrhythmia    Chronic lower back pain    Anxiety    Arthritis of knee, right 04/12/2017   Bipolar  disorder ([place]) 04/03/2017   Lipoma of forehead 09/22/2016   Trigger ring finger of right hand 06/22/2016   Paroxysmal atrial fibrillation ([facility])    AF (atrial fibrillation) ([place]) 12/10/2015   Eczema 07/10/2015   History of CVA (cerebrovascular accident) 05/04/2015   Tear of medial meniscus of right knee 03/18/2015   Chronic pain of right knee 03/12/2015   Other and unspecified hyperlipidemia 11/25/2013  Nasal congestion 11/25/2013   Sinusitis, chronic 05/09/2013   Chronic low back pain 10/12/2012   Lumbar radiculopathy 10/12/2012   Hypertension 10/12/2012   Depression 10/12/2012   Insomnia 10/12/2012    Hilda Blades, PT, DPT, LAT, ATC 01/21/21  12:47 PM Phone: [phone] Fax: [facility] [address] [place], [place], [address] Phone: [phone]   Fax:  [phone]  Name: Jason Stokes MRN: [id number] Date of Birth: 04/14/1961

## 2021-01-21 NOTE — Patient Instructions (Signed)
Access Code: [facility] URL: [email or web address] Date: 01/21/2021 Prepared by: Hilda Blades  Exercises Seated Scapular Retraction - 1-2 x daily - 7 x weekly - 10 reps - 3 hold Seated Cervical Retraction - 1-2 x daily - 7 x weekly - 10 reps - 3 hold Gentle Upper Trap Stretch - 1-2 x daily - 7 x weekly - 10 reps - 3 hold Banded Row - 1 x daily - 7 x weekly - 2 sets - 15 reps Standing Shoulder Horizontal Abduction with Resistance - 1 x daily - 7 x weekly - 2 sets - 10 reps Supine Lower Trunk Rotation - 1-2 x daily - 7 x weekly - 5 reps - 10 hold Supine Piriformis Stretch with Foot on Ground - 1-2 x daily - 7 x weekly - 3 reps - 30 hold Supine March - 1 x daily - 7 x weekly - 10 reps - 2 sets Small Range Straight Leg Raise - 1 x daily - 7 x weekly - 2 sets - 10 reps Supine Bridge - 1 x daily - 7 x weekly - 3 sets - 5 reps Seated Hamstring Stretch - 1-2 x daily - 7 x weekly - 3 reps - 20 hold

## 2021-01-22 ENCOUNTER — Ambulatory Visit (INDEPENDENT_AMBULATORY_CARE_PROVIDER_SITE_OTHER): Payer: Medicaid Other

## 2021-01-22 DIAGNOSIS — J309 Allergic rhinitis, unspecified: Secondary | ICD-10-CM | POA: Diagnosis not present

## 2021-01-25 ENCOUNTER — Encounter (HOSPITAL_BASED_OUTPATIENT_CLINIC_OR_DEPARTMENT_OTHER)
Admission: RE | Disposition: A | Discharge status: Home / Self Care | Payer: Self-pay | Source: Home / Self Care | Attending: Plastic Surgery

## 2021-01-25 ENCOUNTER — Encounter (HOSPITAL_BASED_OUTPATIENT_CLINIC_OR_DEPARTMENT_OTHER): Payer: Self-pay | Admitting: Plastic Surgery

## 2021-01-25 ENCOUNTER — Ambulatory Visit (HOSPITAL_BASED_OUTPATIENT_CLINIC_OR_DEPARTMENT_OTHER): Payer: Medicaid Other | Admitting: Certified Registered"

## 2021-01-25 ENCOUNTER — Ambulatory Visit (HOSPITAL_BASED_OUTPATIENT_CLINIC_OR_DEPARTMENT_OTHER)
Admission: RE | Admit: 2021-01-25 | Discharge: 2021-01-25 | Disposition: A | Discharge status: Home / Self Care | Payer: Medicaid Other | Attending: Plastic Surgery | Admitting: Plastic Surgery

## 2021-01-25 ENCOUNTER — Other Ambulatory Visit: Payer: Self-pay

## 2021-01-25 DIAGNOSIS — Z79899 Other long term (current) drug therapy: Secondary | ICD-10-CM | POA: Insufficient documentation

## 2021-01-25 DIAGNOSIS — R1900 Intra-abdominal and pelvic swelling, mass and lump, unspecified site: Secondary | ICD-10-CM | POA: Insufficient documentation

## 2021-01-25 DIAGNOSIS — F17200 Nicotine dependence, unspecified, uncomplicated: Secondary | ICD-10-CM | POA: Diagnosis not present

## 2021-01-25 DIAGNOSIS — I480 Paroxysmal atrial fibrillation: Secondary | ICD-10-CM | POA: Insufficient documentation

## 2021-01-25 DIAGNOSIS — Z7901 Long term (current) use of anticoagulants: Secondary | ICD-10-CM | POA: Insufficient documentation

## 2021-01-25 DIAGNOSIS — Z8673 Personal history of transient ischemic attack (TIA), and cerebral infarction without residual deficits: Secondary | ICD-10-CM | POA: Diagnosis not present

## 2021-01-25 DIAGNOSIS — I723 Aneurysm of iliac artery: Secondary | ICD-10-CM | POA: Insufficient documentation

## 2021-01-25 HISTORY — PX: MASS EXCISION: SHX2000

## 2021-01-25 SURGERY — EXCISION MASS
Anesthesia: General | Site: Abdomen

## 2021-01-25 MED ORDER — PROPOFOL 10 MG/ML IV BOLUS
INTRAVENOUS | Status: DC | PRN
  Administered 2021-01-25: 150 mg via INTRAVENOUS
  Administered 2021-01-25: 50 mg via INTRAVENOUS

## 2021-01-25 MED ORDER — CEFAZOLIN IN SODIUM CHLORIDE 3-0.9 GM/100ML-% IV SOLN
3.0000 g | INTRAVENOUS | Status: AC
  Administered 2021-01-25: 2 g via INTRAVENOUS

## 2021-01-25 MED ORDER — CEFAZOLIN SODIUM-DEXTROSE 2-4 GM/100ML-% IV SOLN
INTRAVENOUS | Status: AC
  Filled 2021-01-25: qty 100

## 2021-01-25 MED ORDER — DEXMEDETOMIDINE (PRECEDEX) IN NS 20 MCG/5ML (4 MCG/ML) IV SYRINGE
PREFILLED_SYRINGE | INTRAVENOUS | Status: DC | PRN
  Administered 2021-01-25 (×2): 4 ug via INTRAVENOUS

## 2021-01-25 MED ORDER — KETOROLAC TROMETHAMINE 30 MG/ML IJ SOLN
INTRAMUSCULAR | Status: DC | PRN
  Administered 2021-01-25: 30 mg via INTRAVENOUS

## 2021-01-25 MED ORDER — FENTANYL CITRATE (PF) 100 MCG/2ML IJ SOLN
INTRAMUSCULAR | Status: DC | PRN
  Administered 2021-01-25 (×2): 50 ug via INTRAVENOUS

## 2021-01-25 MED ORDER — MIDAZOLAM HCL 5 MG/5ML IJ SOLN
INTRAMUSCULAR | Status: DC | PRN
  Administered 2021-01-25: 2 mg via INTRAVENOUS

## 2021-01-25 MED ORDER — 0.9 % SODIUM CHLORIDE (POUR BTL) OPTIME
TOPICAL | Status: DC | PRN
  Administered 2021-01-25: 50 mL

## 2021-01-25 MED ORDER — FENTANYL CITRATE (PF) 100 MCG/2ML IJ SOLN
INTRAMUSCULAR | Status: AC
  Filled 2021-01-25: qty 2

## 2021-01-25 MED ORDER — MIDAZOLAM HCL 2 MG/2ML IJ SOLN
INTRAMUSCULAR | Status: AC
  Filled 2021-01-25: qty 2

## 2021-01-25 MED ORDER — OXYCODONE HCL 5 MG/5ML PO SOLN: 5.0000 mg | Freq: Once | ORAL | Status: AC | PRN

## 2021-01-25 MED ORDER — CHLORHEXIDINE GLUCONATE CLOTH 2 % EX PADS: 6.0000 | MEDICATED_PAD | Freq: Once | CUTANEOUS | Status: DC

## 2021-01-25 MED ORDER — ONDANSETRON HCL 4 MG/2ML IJ SOLN
INTRAMUSCULAR | Status: DC | PRN
  Administered 2021-01-25 (×2): 4 mg via INTRAVENOUS

## 2021-01-25 MED ORDER — LACTATED RINGERS IV SOLN: INTRAVENOUS | Status: DC

## 2021-01-25 MED ORDER — ONDANSETRON HCL 4 MG/2ML IJ SOLN: 4.0000 mg | Freq: Once | INTRAMUSCULAR | Status: DC | PRN

## 2021-01-25 MED ORDER — PROPOFOL 10 MG/ML IV BOLUS
INTRAVENOUS | Status: AC
  Filled 2021-01-25: qty 20

## 2021-01-25 MED ORDER — AMISULPRIDE (ANTIEMETIC) 5 MG/2ML IV SOLN: 10.0000 mg | Freq: Once | INTRAVENOUS | Status: DC | PRN

## 2021-01-25 MED ORDER — DEXAMETHASONE SODIUM PHOSPHATE 4 MG/ML IJ SOLN
INTRAMUSCULAR | Status: DC | PRN
  Administered 2021-01-25: 10 mg via INTRAVENOUS

## 2021-01-25 MED ORDER — FENTANYL CITRATE (PF) 100 MCG/2ML IJ SOLN: 25.0000 ug | INTRAMUSCULAR | Status: DC | PRN

## 2021-01-25 MED ORDER — BUPIVACAINE HCL (PF) 0.25 % IJ SOLN
INTRAMUSCULAR | Status: DC | PRN
  Administered 2021-01-25: 10 mL

## 2021-01-25 MED ORDER — OXYCODONE HCL 5 MG PO TABS
ORAL_TABLET | ORAL | Status: AC
  Filled 2021-01-25: qty 1

## 2021-01-25 MED ORDER — OXYCODONE HCL 10 MG PO TABS: 10.0000 mg | ORAL_TABLET | Freq: Four times a day (QID) | ORAL | 0 refills | Status: DC | PRN

## 2021-01-25 MED ORDER — OXYCODONE HCL 5 MG PO TABS
5.0000 mg | ORAL_TABLET | Freq: Once | ORAL | Status: AC | PRN
  Administered 2021-01-25: 5 mg via ORAL

## 2021-01-25 MED ORDER — LIDOCAINE 2% (20 MG/ML) 5 ML SYRINGE
INTRAMUSCULAR | Status: DC | PRN
  Administered 2021-01-25: 100 mg via INTRAVENOUS

## 2021-01-25 SURGICAL SUPPLY — 63 items
ADH SKN CLS APL DERMABOND .7 (GAUZE/BANDAGES/DRESSINGS) ×1
APL PRP STRL LF DISP 70% ISPRP (MISCELLANEOUS) ×1
APL SKNCLS STERI-STRIP NONHPOA (GAUZE/BANDAGES/DRESSINGS)
BAND INSRT 18 STRL LF DISP RB (MISCELLANEOUS)
BAND RUBBER #18 3X1/16 STRL (MISCELLANEOUS) IMPLANT
BENZOIN TINCTURE PRP APPL 2/3 (GAUZE/BANDAGES/DRESSINGS) IMPLANT
BLADE CLIPPER SURG (BLADE) IMPLANT
BLADE SURG 11 STRL SS (BLADE) IMPLANT
BLADE SURG 15 STRL LF DISP TIS (BLADE) ×1 IMPLANT
BLADE SURG 15 STRL SS (BLADE) ×2
CANISTER SUCT 1200ML W/VALVE (MISCELLANEOUS) ×1 IMPLANT
CHLORAPREP W/TINT 26 (MISCELLANEOUS) ×2 IMPLANT
COVER BACK TABLE 60X90IN (DRAPES) ×2 IMPLANT
COVER MAYO STAND STRL (DRAPES) ×2 IMPLANT
DERMABOND ADVANCED (GAUZE/BANDAGES/DRESSINGS) ×1
DERMABOND ADVANCED .7 DNX12 (GAUZE/BANDAGES/DRESSINGS) IMPLANT
DRAIN JP 10F RND SILICONE (MISCELLANEOUS) IMPLANT
DRAPE LAPAROTOMY 100X72 PEDS (DRAPES) ×2 IMPLANT
DRAPE U-SHAPE 76X120 STRL (DRAPES) IMPLANT
DRAPE UTILITY XL STRL (DRAPES) ×2 IMPLANT
DRSG TELFA 3X8 NADH (GAUZE/BANDAGES/DRESSINGS) IMPLANT
ELECT COATED BLADE 2.86 ST (ELECTRODE) ×2 IMPLANT
ELECT NEEDLE BLADE 2-5/6 (NEEDLE) ×2 IMPLANT
ELECT REM PT RETURN 9FT ADLT (ELECTROSURGICAL) ×2
ELECT REM PT RETURN 9FT PED (ELECTROSURGICAL)
ELECTRODE REM PT RETRN 9FT PED (ELECTROSURGICAL) IMPLANT
ELECTRODE REM PT RTRN 9FT ADLT (ELECTROSURGICAL) IMPLANT
EVACUATOR SILICONE 100CC (DRAIN) IMPLANT
GAUZE SPONGE 4X4 12PLY STRL LF (GAUZE/BANDAGES/DRESSINGS) IMPLANT
GAUZE XEROFORM 1X8 LF (GAUZE/BANDAGES/DRESSINGS) IMPLANT
GLOVE SURG HYDRASOFT LTX SZ5.5 (GLOVE) ×4 IMPLANT
GOWN STRL REUS W/ TWL LRG LVL3 (GOWN DISPOSABLE) ×2 IMPLANT
GOWN STRL REUS W/TWL LRG LVL3 (GOWN DISPOSABLE) ×4
NDL HYPO 30GX1 BEV (NEEDLE) IMPLANT
NDL PRECISIONGLIDE 27X1.5 (NEEDLE) ×1 IMPLANT
NEEDLE HYPO 30GX1 BEV (NEEDLE) IMPLANT
NEEDLE PRECISIONGLIDE 27X1.5 (NEEDLE) ×2 IMPLANT
NS IRRIG 1000ML POUR BTL (IV SOLUTION) IMPLANT
PACK BASIN DAY SURGERY FS (CUSTOM PROCEDURE TRAY) ×2 IMPLANT
PENCIL SMOKE EVACUATOR (MISCELLANEOUS) ×2 IMPLANT
SHEET MEDIUM DRAPE 40X70 STRL (DRAPES) IMPLANT
SLEEVE SCD COMPRESS KNEE MED (STOCKING) ×2 IMPLANT
SPONGE GAUZE 2X2 8PLY STRL LF (GAUZE/BANDAGES/DRESSINGS) IMPLANT
SPONGE T-LAP 18X18 ~~LOC~~+RFID (SPONGE) ×1 IMPLANT
STAPLER VISISTAT 35W (STAPLE) ×2 IMPLANT
STRIP CLOSURE SKIN 1/2X4 (GAUZE/BANDAGES/DRESSINGS) IMPLANT
SUCTION FRAZIER HANDLE 10FR (MISCELLANEOUS)
SUCTION TUBE FRAZIER 10FR DISP (MISCELLANEOUS) IMPLANT
SUT ETHILON 4 0 PS 2 18 (SUTURE) IMPLANT
SUT MNCRL AB 4-0 PS2 18 (SUTURE) ×2 IMPLANT
SUT MON AB 5-0 P3 18 (SUTURE) IMPLANT
SUT PLAIN 5 0 P 3 18 (SUTURE) IMPLANT
SUT PROLENE 5 0 P 3 (SUTURE) IMPLANT
SUT PROLENE 6 0 P 1 18 (SUTURE) IMPLANT
SUT VIC AB 3-0 PS2 18 (SUTURE) ×2 IMPLANT
SUT VICRYL 4-0 PS2 18IN ABS (SUTURE) IMPLANT
SWAB COLLECTION DEVICE MRSA (MISCELLANEOUS) IMPLANT
SWAB CULTURE ESWAB REG 1ML (MISCELLANEOUS) IMPLANT
SYR BULB EAR ULCER 3OZ GRN STR (SYRINGE) IMPLANT
SYR CONTROL 10ML LL (SYRINGE) ×2 IMPLANT
TOWEL GREEN STERILE FF (TOWEL DISPOSABLE) ×2 IMPLANT
TRAY DSU PREP LF (CUSTOM PROCEDURE TRAY) IMPLANT
TUBE CONNECTING 20X1/4 (TUBING) ×2 IMPLANT

## 2021-01-25 NOTE — Op Note (Signed)
Operative Note   DATE OF OPERATION: 7.11.22  LOCATION: [facility]-outpatient  SURGICAL DIVISION: Plastic Surgery  PREOPERATIVE DIAGNOSES:  Soft tissue mass abdomen  POSTOPERATIVE DIAGNOSES:  same  PROCEDURE:  Excision subfascial mass trunk 3 cm  SURGEON: Irene Limbo MD MBA  ASSISTANT: none  ANESTHESIA:  General.   EBL: minimal  COMPLICATIONS: None immediate.   INDICATIONS FOR PROCEDURE:  The patient, Jason Stokes, is a 60 y.o. male born on 1960-12-28, is here for excision palpable mass abdomen with associated pain and tenderness to palpation.   FINDINGS: Clinically lipoma present in subfascial plane.  DESCRIPTION OF PROCEDURE:  The patient's operative site was marked with the patient in the preoperative area. The patient was taken to the operating room. SCDs were placed and IV antibiotics were given. The patient's operative site was prepped and draped in a sterile fashion. A time out was performed and all information was confirmed to be correct.  Incision made over palpable mass and carried through subcutaneous tissue and superficial fascia. Skin flaps elevated circumferentially in subfascial plane. Clinically lipoma present and dissected free from underlying muscular fascia. Mass diameter 3 cm. Wound irrigated. Hemostasis obtained. Local anesthetic infiltrated. Closure completed with 3-0 vicryl for approximation superficial fascia, 3-0 vicryl in dermis, and skin closure with 4-0 monocryl subcuticular. Dermabond applied.  The patient was allowed to wake from anesthesia, extubated and taken to the recovery room in satisfactory condition.   SPECIMENS: soft tissue mass abdomen  DRAINS: none

## 2021-01-25 NOTE — Anesthesia Postprocedure Evaluation (Signed)
Anesthesia Post Note  [name]  Procedure(s) Performed: EXCISION SUBCUTANEOUS VS SUBFASCIAL MASS TORSO 3CM (Abdomen)     Patient location during evaluation: PACU Anesthesia Type: General Level of consciousness: awake and alert Pain management: pain level controlled Vital Signs Assessment: post-procedure vital signs reviewed and stable Respiratory status: spontaneous breathing, nonlabored ventilation and respiratory function stable Cardiovascular status: blood pressure returned to baseline and stable Postop Assessment: no apparent nausea or vomiting Anesthetic complications: no   No notable events documented.  Last Vitals:  Vitals:   01/25/21 1415 01/25/21 1430  BP: 125/84 130/85  Pulse: 76 70  Resp: 16 (!) 7  Temp:    SpO2: 94% 93%    Last Pain:  Vitals:   01/25/21 1430  TempSrc:   PainSc: 0-No pain                 Lidia Collum

## 2021-01-25 NOTE — Anesthesia Preprocedure Evaluation (Signed)
Anesthesia Evaluation  Patient identified by MRN, date of birth, ID band Patient awake    Reviewed: Allergy & Precautions, NPO status , Patient's Chart, lab work & pertinent test results, reviewed documented beta blocker date and time   History of Anesthesia Complications Negative for: history of anesthetic complications  Airway Mallampati: II  TM Distance: >3 FB Neck ROM: Full    Dental  (+) Dental Advisory Given   Pulmonary asthma , Current Smoker and Patient abstained from smoking.,    Pulmonary exam normal        Cardiovascular hypertension, Pt. on medications and Pt. on home beta blockers Normal cardiovascular exam+ dysrhythmias Atrial Fibrillation      Neuro/Psych Anxiety Depression Bipolar Disorder Schizophrenia CVA    GI/Hepatic Neg liver ROS, GERD  ,  Endo/Other  negative endocrine ROS  Renal/GU negative Renal ROS  negative genitourinary   Musculoskeletal  (+) Arthritis ,   Abdominal   Peds  Hematology negative hematology ROS (+) Eliquis   Anesthesia Other Findings  Echo 01/13/21: EF 60-65%, normal wall motion, mod LVH, g1dd, normal RV function, trivial MR, valves otherwise unremarkable, borderline aortic root dilatation (38 mm) and mild ascending aortic dilatation (39 mm).  Reproductive/Obstetrics                             Anesthesia Physical Anesthesia Plan  ASA: 3  Anesthesia Plan: General   Post-op Pain Management:    Induction: Intravenous  PONV Risk Score and Plan: 1 and Ondansetron, Dexamethasone, Midazolam and Treatment may vary due to age or medical condition  Airway Management Planned: LMA  Additional Equipment: None  Intra-op Plan:   Post-operative Plan: Extubation in OR  Informed Consent: I have reviewed the patients History and Physical, chart, labs and discussed the procedure including the risks, benefits and alternatives for the proposed anesthesia  with the patient or authorized representative who has indicated his/her understanding and acceptance.     Dental advisory given  Plan Discussed with:   Anesthesia Plan Comments:         Anesthesia Quick Evaluation

## 2021-01-25 NOTE — Anesthesia Procedure Notes (Signed)
Procedure Name: LMA Insertion Date/Time: 01/25/2021 1:29 PM Performed by: Ezequiel Kayser, CRNA Pre-anesthesia Checklist: Patient identified, Emergency Drugs available, Suction available and Patient being monitored Patient Re-evaluated:Patient Re-evaluated prior to induction Oxygen Delivery Method: Circle System Utilized Preoxygenation: Pre-oxygenation with 100% oxygen Induction Type: IV induction Ventilation: Mask ventilation without difficulty LMA: LMA inserted LMA Size: 5.0 Number of attempts: 1 Airway Equipment and Method: Bite block Placement Confirmation: positive ETCO2 Tube secured with: Tape Dental Injury: Teeth and Oropharynx as per pre-operative assessment

## 2021-01-25 NOTE — Transfer of Care (Signed)
Immediate Anesthesia Transfer of Care Note  [name]  Procedure(s) Performed: EXCISION SUBCUTANEOUS VS SUBFASCIAL MASS TORSO 3CM (Abdomen)  Patient Location: PACU  Anesthesia Type:General  Level of Consciousness: drowsy and patient cooperative  Airway & Oxygen Therapy: Patient Spontanous Breathing and Patient connected to face mask oxygen  Post-op Assessment: Report given to RN and Post -op Vital signs reviewed and stable  Post vital signs: Reviewed and stable  Last Vitals:  Vitals Value Taken Time  BP 113/75 01/25/21 1404  Temp    Pulse 72 01/25/21 1406  Resp 12 01/25/21 1406  SpO2 96 % 01/25/21 1406  Vitals shown include unvalidated device data.  Last Pain:  Vitals:   01/25/21 1250  TempSrc: Oral  PainSc: 9       Patients Stated Pain Goal: 3 (49/61/16 4353)  Complications: No notable events documented.

## 2021-01-25 NOTE — Discharge Instructions (Addendum)
May have Ibuprofen after 8pm tonight   Post Anesthesia Home Care Instructions  Activity: Get plenty of rest for the remainder of the day. A responsible individual must stay with you for 24 hours following the procedure.  For the next 24 hours, DO NOT: -Drive a car -Paediatric nurse -Drink alcoholic beverages -Take any medication unless instructed by your physician -Make any legal decisions or sign important papers.  Meals: Start with liquid foods such as gelatin or soup. Progress to regular foods as tolerated. Avoid greasy, spicy, heavy foods. If nausea and/or vomiting occur, drink only clear liquids until the nausea and/or vomiting subsides. Call your physician if vomiting continues.  Special Instructions/Symptoms: Your throat may feel dry or sore from the anesthesia or the breathing tube placed in your throat during surgery. If this causes discomfort, gargle with warm salt water. The discomfort should disappear within 24 hours.  If you had a scopolamine patch placed behind your ear for the management of post- operative nausea and/or vomiting:  1. The medication in the patch is effective for 72 hours, after which it should be removed.  Wrap patch in a tissue and discard in the trash. Wash hands thoroughly with soap and water. 2. You may remove the patch earlier than 72 hours if you experience unpleasant side effects which may include dry mouth, dizziness or visual disturbances. 3. Avoid touching the patch. Wash your hands with soap and water after contact with the patch.  Oxycodone 5mg  given at 2:50pm
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
## 2021-01-26 LAB — SURGICAL PATHOLOGY

## 2021-01-27 ENCOUNTER — Encounter (HOSPITAL_BASED_OUTPATIENT_CLINIC_OR_DEPARTMENT_OTHER): Payer: Self-pay | Admitting: Plastic Surgery

## 2021-01-28 ENCOUNTER — Ambulatory Visit: Payer: Medicaid Other | Admitting: Physical Therapy

## 2021-01-29 ENCOUNTER — Telehealth: Payer: Self-pay

## 2021-01-29 ENCOUNTER — Ambulatory Visit (INDEPENDENT_AMBULATORY_CARE_PROVIDER_SITE_OTHER): Payer: Medicaid Other

## 2021-01-29 DIAGNOSIS — J309 Allergic rhinitis, unspecified: Secondary | ICD-10-CM

## 2021-01-29 NOTE — Telephone Encounter (Signed)
-----   Message from Ladell Pier, MD sent at 01/26/2021 10:47 PM EDT ----- Let pt know that the mass removed from abdomen was fatty tissue call a lipoma.

## 2021-01-29 NOTE — Telephone Encounter (Signed)
Patient name and DOB has been verified Patient was informed of lab results. Patient had no questions.  

## 2021-02-01 ENCOUNTER — Telehealth: Payer: Self-pay | Admitting: Cardiology

## 2021-02-01 ENCOUNTER — Other Ambulatory Visit: Payer: Self-pay | Admitting: Family Medicine

## 2021-02-01 ENCOUNTER — Other Ambulatory Visit: Payer: Self-pay

## 2021-02-01 ENCOUNTER — Telehealth: Payer: Self-pay

## 2021-02-01 DIAGNOSIS — Z9889 Other specified postprocedural states: Secondary | ICD-10-CM
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
## 2021-02-01 NOTE — Telephone Encounter (Signed)
Pt c/o swelling: STAT is pt has developed SOB within 24 hours  How much weight have you gained and in what time span? No  If swelling, where is the swelling located? Bilateral leg/feet/ankle swelling   Are you currently taking a fluid pill? No  Are you currently SOB? Pt has a cracked rib  Do you have a log of your daily weights (if so, list)? No  Have you gained 3 pounds in a day or 5 pounds in a week?   Have you traveled recently? No Pt states his swelling has been going on since his last appt 12/17/20, he have kept his legs elevated and they have just gotten bigger. Pt is not currently on a fluid pill

## 2021-02-01 NOTE — Telephone Encounter (Signed)
Pt reports increased BLEE issues since last OV on 6/2. He can no longer get even flip flops on his feet, had to go to "an old pair that would fit over swelling". Denies CP, SOB, weight gain, dizziness, palpitations or racing heart/AFib issues. At last OV w/ Tommye Standard she notates trace edema -- decreased Carvedilol, increased Diltiazem. Pt aware will review MD and let him know recommendation this week, aware it will not be today.  Will reach out to optometrist to obtain recent lab work performed on pt.

## 2021-02-02 NOTE — Telephone Encounter (Signed)
Jason Stokes is calling wanting to know Dr. Curt Bears recommendations in regards to his Edema as well as if he needs to schedule an appt due to not hearing back yet. Please advise.

## 2021-02-04 ENCOUNTER — Ambulatory Visit: Payer: Medicaid Other | Admitting: Physical Therapy

## 2021-02-05 ENCOUNTER — Other Ambulatory Visit: Payer: Self-pay

## 2021-02-05 ENCOUNTER — Ambulatory Visit: Payer: Medicaid Other | Admitting: Physical Therapy

## 2021-02-05 ENCOUNTER — Encounter: Payer: Self-pay | Admitting: Physical Therapy

## 2021-02-05 DIAGNOSIS — M6281 Muscle weakness (generalized): Secondary | ICD-10-CM

## 2021-02-05 DIAGNOSIS — M542 Cervicalgia: Secondary | ICD-10-CM | POA: Diagnosis not present

## 2021-02-05 DIAGNOSIS — G8929 Other chronic pain: Secondary | ICD-10-CM

## 2021-02-05 NOTE — Therapy (Signed)
[place], [place], [address] Phone: [phone]   Fax:  [phone]  Physical Therapy Treatment  Patient Details  Name: GENEROSO BATTENFIELD MRN: [id number] Date of Birth: 12/30/1960 Referring Provider (PT): Charlott Rakes, MD   Encounter Date: 02/05/2021   PT End of Session - 02/05/21 0836     Visit Number 5    Number of Visits 8    Date for PT Re-Evaluation 02/22/21    Authorization Type Med Pay / MCD    Authorization Time Period 01/28/2021 - 02/24/2021    Authorization - Visit Number 1    Authorization - Number of Visits 4    PT Start Time 0830    PT Stop Time 0915    PT Time Calculation (min) 45 min    Activity Tolerance Patient limited by pain;Patient tolerated treatment well    Behavior During Therapy [facility] for tasks assessed/performed             Past Medical History:  Diagnosis Date   Anxiety    Bilateral carpal tunnel syndrome 01/10/2018   Bipolar disorder ([place])    Chronic lower back pain    Depression    Dysrhythmia    a-fib   GERD (gastroesophageal reflux disease)    History of alcohol abuse    History of nuclear stress test    Myoview 10/16: EF 50%, diaphragmatic attenuation, no ischemia, low risk   Hypertension    Migraine 2012-2014   Moderate persistent asthma with acute exacerbation 05/02/2018   PAF (paroxysmal atrial fibrillation) ([place]) 03/27/2015   a. Myoview neg for ischemia >> Flecainide started 10/16 >> FU ETT    Schizophrenia ([place])    Sciatica neuralgia    Small vessel disease ([place])    Right basal ganglia stroke   Stroke [facility]) "between 2012-2014"   residual "AF" (09/22/2015)    Past Surgical History:  Procedure Laterality Date   ATRIAL FIBRILLATION ABLATION  09/22/2015   CYST EXCISION  1996-97   surgery back of head    ELECTROPHYSIOLOGIC STUDY N/A 09/22/2015   Procedure: Atrial Fibrillation Ablation;  Surgeon: Will [name], MD;  Location: [place] [facility];   Service: Cardiovascular;  Laterality: N/A;   ELECTROPHYSIOLOGIC STUDY N/A 12/10/2015   Procedure: Atrial Fibrillation Ablation;  Surgeon: Will [name], MD;  Location: [place] [facility];  Service: Cardiovascular;  Laterality: N/A;   ELECTROPHYSIOLOGIC STUDY N/A 12/11/2015   Procedure: Cardioversion;  Surgeon: Will [name], MD;  Location: [place] [facility];  Service: Cardiovascular;  Laterality: N/A;   EMBOLIZATION Right 08/19/2020   Procedure: EMBOLIZATION;  Surgeon: Cherre Robins, MD;  Location: [place] [facility];  Service: Cardiovascular;  Laterality: Right;  hypogastric   EXCISION MASS HEAD N/A 01/06/2017   Procedure: EXCISION MASS FOREHEAD;  Surgeon: Irene Limbo, MD;  Location: [place];  Service: Plastics;  Laterality: N/A;   GANGLION CYST EXCISION Left    INTERCOSTAL NERVE BLOCK  2005   KNEE ARTHROSCOPY Right 2016   MASS EXCISION N/A 01/25/2021   Procedure: EXCISION SUBCUTANEOUS VS SUBFASCIAL MASS TORSO 3CM;  Surgeon: Irene Limbo, MD;  Location: [place];  Service: Plastics;  Laterality: N/A;    There were no vitals filed for this visit.   Subjective Assessment - 02/05/21 0833     Subjective Patent reports he had a lipoma removed so he hasn't been able to do much since last visit. States he is feeling fine today, neck  and back are a little stiff since he hasn't been moving much.    Patient Stated Goals Patient wants to be able to move better with less pain    Currently in Pain? Yes    Pain Score 8     Pain Location Neck    Pain Descriptors / Indicators Tightness;Tingling;Aching    Pain Type Chronic pain    Pain Onset More than a month ago    Pain Frequency Constant    Pain Score 8    Pain Location Back    Pain Orientation Lower    Pain Descriptors / Indicators Pressure    Pain Type Chronic pain    Pain Onset More than a month ago    Pain Frequency Constant                [facility] PT Assessment - 02/05/21 0001        AROM   Cervical - Right Rotation 50    Cervical - Left Rotation 70    Lumbar Flexion 25%    Lumbar Extension 25%    Lumbar - Right Side Bend 50%    Lumbar - Left Side Bend 50%    Lumbar - Right Rotation 50%    Lumbar - Left Rotation 50%                           [facility] Adult PT Treatment/Exercise - 02/05/21 0001       Exercises   Exercises Neck;Lumbar      Neck Exercises: Supine   Neck Retraction 10 reps;5 secs   2 sets     Lumbar Exercises: Stretches   Passive Hamstring Stretch 2 reps;60 seconds    Passive Hamstring Stretch Limitations seated egde of mat    Lower Trunk Rotation 5 reps;10 seconds    Hip Flexor Stretch 2 reps;60 seconds    Hip Flexor Stretch Limitations modified thomas PROM    Piriformis Stretch 2 reps;60 seconds    Piriformis Stretch Limitations hooklying figure-4 PROM    Other Lumbar Stretch Exercise Seated lumbar flexion physioball rollout 5 x 10 sec      Lumbar Exercises: Aerobic   Nustep L5 x 5 min with UE/LE while taking subjective      Lumbar Exercises: Supine   Large Ball Abdominal Isometric 10 reps;5 seconds      Neck Exercises: Stretches   Upper Trapezius Stretch 2 reps;30 seconds    Levator Stretch 2 reps;30 seconds    Other Neck Stretches Sidelying thoracic rotation 10 x 5 sec each                    PT Education - 02/05/21 0835     Education Details HEP    Person(s) Educated Patient    Methods Explanation;Demonstration;Verbal cues    Comprehension Verbalized understanding;Returned demonstration;Verbal cues required;Need further instruction              PT Short Term Goals - 02/05/21 0859       PT SHORT TERM GOAL #1   Title Patient will be I with initial HEP to progress with PT    Baseline patient is independent with initial HEP    Time 4    Period Weeks    Status Achieved    Target Date 01/25/21      PT SHORT TERM GOAL #2   Title Patient will report </= 6/10 pain to improve mobility and  performance of  household tasks    Baseline patient continues to report 8/10 pain    Time 4    Period Weeks    Status On-going    Target Date 01/25/21      PT SHORT TERM GOAL #3   Title Patient will demonstrate 5 deg improvement in all planes of cervical motion to indicate reduced muscular tension and pain    Baseline Cervical flexion 20 -> 30 deg, extension 15 -> 35 deg, right rotation 45 -> 50 deg, left rotation 60 -> 70 deg, right sidebend 10 -> 25 deg, left sidebend 20 -> 40 deg    Time 4    Period Weeks    Status Achieved    Target Date 01/25/21               PT Long Term Goals - 01/21/21 1237       PT LONG TERM GOAL #1   Title Patient will be I with final HEP to maintain progress from PT    Baseline patient is progressing toward final HEP    Time 8    Period Weeks    Status On-going    Target Date 02/22/21      PT LONG TERM GOAL #2   Title Patient will report </= 4/10 pain level with household activity and ambulation to reduce functional limitation    Baseline patient continues to report 8-9/10 pain levels    Time 8    Period Weeks    Status On-going    Target Date 02/22/21      PT LONG TERM GOAL #3   Title Patient will be able to walk >/= 30 minutes without requiring rest due to pain in order to improve community access    Baseline patient reports 15-20 min of walking before needing break due to pain    Time 8    Period Weeks    Status On-going    Target Date 02/22/21      PT LONG TERM GOAL #4   Title Patient will be able to sit >/= 45 minutes without having to change positions to improve leisure    Baseline patient reports typically 30 minutes before getting up do to pain    Time 8    Period Weeks    Status On-going    Target Date 02/22/21      PT LONG TERM GOAL #5   Title Patient will demonstrate improved strength >/= 4+/5 MMT throughout BLE and BUE in order to improve performance of household tasks    Baseline patient continues to exhibit strenth  deficit grossly </= 4+/5 MMT throughout BLE and BUE    Time 8    Period Weeks    Status On-going    Target Date 02/22/21                   Plan - 02/05/21 0837     Clinical Impression Statement Patient tolerated therapy well with no adverse effects. He did report increased stiffness this visit secondary to lack of activity from recent surgery so therapy focused primarily on mobility and stretching exercises. Patient did report feeling much looser and able to walk better following therapy. Overall his lumbar range seems to be improving He was encouraged to work on his exercises at home to improve his mobility. Patient would benefit from continued skilled PT to progress his mobility and strength in order to reduce pain and maximize functional ability with walking and ADLs.    PT  Treatment/Interventions ADLs/Self Care Home Management;Aquatic Therapy;Cryotherapy;Electrical Stimulation;Iontophoresis '4mg'$ /ml Dexamethasone;Moist Heat;Traction;Ultrasound;Neuromuscular re-education;Balance training;Therapeutic exercise;Therapeutic activities;Functional mobility training;Stair training;Gait training;Patient/family education;Manual techniques;Dry needling;Passive range of motion;Taping;Vasopneumatic Device;Spinal Manipulations;Joint Manipulations    PT Next Visit Plan Review HEP and progress PRN, manual for neck and lower back as tolerated to improve motion and reduce pain, progress light mobility and strengthening for posture and core    PT Home Exercise Plan [facility]    Consulted and Agree with Plan of Care Patient             Patient will benefit from skilled therapeutic intervention in order to improve the following deficits and impairments:  Abnormal gait, Decreased range of motion, Difficulty walking, Decreased activity tolerance, Increased muscle spasms, Pain, Decreased balance, Impaired flexibility, Postural dysfunction, Improper body mechanics, Decreased strength, Impaired  sensation  Visit Diagnosis: Cervicalgia  Chronic bilateral low back pain, unspecified whether sciatica present  Muscle weakness (generalized)     Problem List Patient Active Problem List   Diagnosis Date Noted   Neck pain on left side 03/06/2019   Musculoskeletal chest pain 07/24/2018   Asthma, mild intermittent 05/02/2018   Allergic rhinitis caused by mold 05/02/2018   Tobacco use 05/02/2018   Bilateral carpal tunnel syndrome 01/10/2018   Stroke ([facility])    Schizophrenia ([facility])    Migraine    History of nuclear stress test    History of alcohol abuse    GERD (gastroesophageal reflux disease)    Dysrhythmia    Chronic lower back pain    Anxiety    Arthritis of knee, right 04/12/2017   Bipolar disorder ([place]) 04/03/2017   Lipoma of forehead 09/22/2016   Trigger ring finger of right hand 06/22/2016   Paroxysmal atrial fibrillation ([facility])    AF (atrial fibrillation) ([place]) 12/10/2015   Eczema 07/10/2015   History of CVA (cerebrovascular accident) 05/04/2015   Tear of medial meniscus of right knee 03/18/2015   Chronic pain of right knee 03/12/2015   Other and unspecified hyperlipidemia 11/25/2013   Nasal congestion 11/25/2013   Sinusitis, chronic 05/09/2013   Chronic low back pain 10/12/2012   Lumbar radiculopathy 10/12/2012   Hypertension 10/12/2012   Depression 10/12/2012   Insomnia 10/12/2012    Hilda Blades, PT, DPT, LAT, ATC 02/05/21  9:15 AM Phone: [phone] Fax: [facility] [address] [place], [place], [address] Phone: [phone]   Fax:  [phone]  Name: ANDREZ SPIVA MRN: [id number] Date of Birth: 03-Nov-1960

## 2021-02-05 NOTE — Telephone Encounter (Signed)
Per pt is continuing to swell now it is in the calf area whereas when called was just in feet and ankles Also pt has on his own decreased all meds to 1 a day  Will forward to Dr Curt Bears for review and recommendation./cy

## 2021-02-05 NOTE — Telephone Encounter (Signed)
Patient I calling back because he hasnt heard anything and seeing if there any update. Please advise

## 2021-02-08 NOTE — Telephone Encounter (Signed)
Pt scheduled to see Dr. Curt Bears tomorrow afternoon. Pt agreeable to plan.

## 2021-02-08 NOTE — Telephone Encounter (Signed)
Patient is following up regarding edema. Please return call when able.

## 2021-02-09 ENCOUNTER — Ambulatory Visit (INDEPENDENT_AMBULATORY_CARE_PROVIDER_SITE_OTHER): Payer: Medicaid Other | Admitting: Cardiology

## 2021-02-09 ENCOUNTER — Encounter: Payer: Self-pay | Admitting: Cardiology

## 2021-02-09 ENCOUNTER — Other Ambulatory Visit: Payer: Self-pay

## 2021-02-09 VITALS — BP 138/80 | HR 78 | Ht 73.0 in | Wt 260.0 lb

## 2021-02-09 DIAGNOSIS — R609 Edema, unspecified: Secondary | ICD-10-CM | POA: Diagnosis not present

## 2021-02-09 DIAGNOSIS — I480 Paroxysmal atrial fibrillation: Secondary | ICD-10-CM

## 2021-02-09 MED ORDER — FUROSEMIDE 20 MG PO TABS: ORAL_TABLET | ORAL | 1 refills | Status: DC

## 2021-02-09 NOTE — Progress Notes (Signed)
Electrophysiology Office Note   Date:  02/09/2021   ID:  Jason Stokes, DOB 1961-04-09, MRN [id number]  PCP:  Charlott Rakes, MD  Cardiologist:  [name] Primary Electrophysiologist: Chrissa Meetze [name], MD    No chief complaint on file.    History of Present Illness: Jason Stokes is a 60 y.o. male who presents today for electrophysiology evaluation.     He has a history significant for paroxysmal atrial fibrillation, hypertension, and remote CVA.  He is status post atrial fibrillation ablation 12/10/2015.  He has had no further episodes of atrial fibrillation since that time.  Today, denies symptoms of palpitations, chest pain, shortness of breath, orthopnea, PND, claudication, dizziness, presyncope, syncope, bleeding, or neurologic sequela. The patient is tolerating medications without difficulties.  He overall feels well.  His main complaint is lower extremity edema.  The last month, his edema has worsened.  At times, it is quite severe.  He has no chest pain or shortness of breath.   Past Medical History:  Diagnosis Date   Anxiety    Bilateral carpal tunnel syndrome 01/10/2018   Bipolar disorder (HCC)    Chronic lower back pain    Depression    Dysrhythmia    a-fib   GERD (gastroesophageal reflux disease)    History of alcohol abuse    History of nuclear stress test    Myoview 10/16: EF 50%, diaphragmatic attenuation, no ischemia, low risk   Hypertension    Migraine 2012-2014   Moderate persistent asthma with acute exacerbation 05/02/2018   PAF (paroxysmal atrial fibrillation) ([place]) 03/27/2015   a. Myoview neg for ischemia >> Flecainide started 10/16 >> FU ETT    Schizophrenia ([place])    Sciatica neuralgia    Small vessel disease ([place])    Right basal ganglia stroke   Stroke [facility]) "between 2012-2014"   residual "AF" (09/22/2015)   Past Surgical History:  Procedure Laterality Date   ATRIAL FIBRILLATION ABLATION  09/22/2015   CYST EXCISION  1996-97   surgery  back of head    ELECTROPHYSIOLOGIC STUDY N/A 09/22/2015   Procedure: Atrial Fibrillation Ablation;  Surgeon: Wilene Pharo [name], MD;  Location: [place] [facility];  Service: Cardiovascular;  Laterality: N/A;   ELECTROPHYSIOLOGIC STUDY N/A 12/10/2015   Procedure: Atrial Fibrillation Ablation;  Surgeon: Decker Cogdell [name], MD;  Location: [place] [facility];  Service: Cardiovascular;  Laterality: N/A;   ELECTROPHYSIOLOGIC STUDY N/A 12/11/2015   Procedure: Cardioversion;  Surgeon: Henning Ehle [name], MD;  Location: [place] [facility];  Service: Cardiovascular;  Laterality: N/A;   EMBOLIZATION Right 08/19/2020   Procedure: EMBOLIZATION;  Surgeon: Cherre Robins, MD;  Location: [place] [facility];  Service: Cardiovascular;  Laterality: Right;  hypogastric   EXCISION MASS HEAD N/A 01/06/2017   Procedure: EXCISION MASS FOREHEAD;  Surgeon: Irene Limbo, MD;  Location: [facility];  Service: Plastics;  Laterality: N/A;   GANGLION CYST EXCISION Left    INTERCOSTAL NERVE BLOCK  2005   KNEE ARTHROSCOPY Right 2016   MASS EXCISION N/A 01/25/2021   Procedure: EXCISION SUBCUTANEOUS VS SUBFASCIAL MASS TORSO 3CM;  Surgeon: Irene Limbo, MD;  Location: [facility];  Service: Plastics;  Laterality: N/A;     Current Outpatient Medications  Medication Sig Dispense Refill   albuterol (PROAIR HFA) 108 (90 Base) MCG/ACT inhaler Inhale 2 puffs into the lungs every 4 (four) hours as needed for wheezing or shortness of breath. 18 g 2   ALPHAGAN P 0.1 %  SOLN Place 1 drop into the left eye 2 (two) times daily.     amoxicillin-clavulanate (AUGMENTIN) 875-125 MG tablet Take 1 tablet by mouth every 12 (twelve) hours. 14 tablet 0   apixaban (ELIQUIS) 5 MG TABS tablet Take 1 tablet (5 mg total) by mouth 2 (two) times daily. 60 tablet 6   Azelastine HCl 0.15 % SOLN Place 1 spray into both nostrils 2 (two) times daily. 30 mL 5   baclofen (LIORESAL) 10 MG tablet Take 10 mg by mouth 3 (three)  times daily as needed for muscle pain.     carvedilol (COREG) 25 MG tablet Take 1 tablet (25 mg total) by mouth 2 (two) times daily. 180 tablet 1   cetirizine (ZYRTEC) 10 MG tablet Take 1 tablet (10 mg total) by mouth daily. 60 tablet 5   CYMBALTA 30 MG capsule Take 30 mg by mouth at bedtime.     diazepam (VALIUM) 5 MG tablet Take 1 tablet (5 mg total) by mouth 2 (two) times daily. 10 tablet 0   diltiazem (CARDIZEM CD) 180 MG 24 hr capsule Take 1 capsule (180 mg total) by mouth daily. 90 capsule 1   dronedarone (MULTAQ) 400 MG tablet Take 1 tablet (400 mg total) by mouth 2 (two) times daily with a meal. 180 tablet 3   fluticasone (FLONASE) 50 MCG/ACT nasal spray Place 1 spray into both nostrils 2 (two) times daily. 16 g 5   fluticasone furoate-vilanterol (BREO ELLIPTA) 200-25 MCG/INH AEPB Inhale 1 puff into the lungs daily. 60 each 2   folic acid (FOLVITE) 1 MG tablet Take 1 mg by mouth daily.     furosemide (LASIX) 20 MG tablet Take 1 tablet daily, as needed, for swelling 30 tablet 1   GARLIC PO Take 1 tablet by mouth daily as needed (To build immune system up).     hydrALAZINE (APRESOLINE) 100 MG tablet TAKE 1 TABLET (100 MG TOTAL) BY MOUTH 3 (THREE) TIMES DAILY. 270 tablet 2   methocarbamol (ROBAXIN) 500 MG tablet Take 1 tablet (500 mg total) by mouth every 8 (eight) hours as needed for muscle spasms. 30 tablet 1   methotrexate (RHEUMATREX) 2.5 MG tablet Take 2.5 mg by mouth daily.     Misc. Devices MISC Back brace. Diagnosis chronic back pain 1 each 0   Misc. Devices MISC Left knee brace. Diagnosis L knee pain 1 each 0   montelukast (SINGULAIR) 10 MG tablet Take 1 tablet (10 mg total) by mouth at bedtime. 30 tablet 5   naloxone (NARCAN) nasal spray 4 mg/0.1 mL Place 1 spray into the nose daily as needed for opioid reversal.     Olopatadine HCl (PATADAY) 0.2 % SOLN Use one drop in each eye once daily as needed. (Patient taking differently: Place 1 drop into both eyes daily as needed (Dry eye).)  2.5 mL 5   omeprazole (PRILOSEC) 20 MG capsule TAKE 1 CAPSULE BY MOUTH EVERY DAY 90 capsule 2   Oxycodone HCl 10 MG TABS Take 1 tablet (10 mg total) by mouth 4 (four) times daily as needed (Pain). 15 tablet 0   prazosin (MINIPRESS) 1 MG capsule Take 1 mg by mouth at bedtime.  2   prednisoLONE acetate (PRED FORTE) 1 % ophthalmic suspension Place 1 drop into the left eye 6 (six) times daily.     predniSONE (DELTASONE) 20 MG tablet Take 20 mg by mouth 3 (three) times daily.     pregabalin (LYRICA) 75 MG capsule Take 1 capsule by  mouth 4 (four) times daily as needed (pain).     SIMBRINZA 1-0.2 % SUSP Place 1 drop into both eyes daily as needed for allergies.     Vitamin D3 (VITAMIN D) 25 MCG tablet Take 1,000 Units by mouth daily.     No current facility-administered medications for this visit.    Allergies:   Lisinopril   Social History:  The patient  reports that he has been smoking cigarettes. He has never used smokeless tobacco. He reports current alcohol use. He reports current drug use. Drug: Marijuana.   Family History:  The patient's family history includes Heart disease in his father; Schizophrenia in his sister.   ROS:  Please see the history of present illness.   Otherwise, review of systems is positive for none.   All other systems are reviewed and negative.   PHYSICAL EXAM: VS:  BP 138/80 (BP Location: Right Arm, Patient Position: Sitting, Cuff Size: Normal)   Pulse 78   Ht '6\' 1"'$  (1.854 m)   Wt 260 lb (117.9 kg)   SpO2 96%   BMI 34.30 kg/m  , BMI Body mass index is 34.3 kg/m. GEN: Well nourished, well developed, in no acute distress  HEENT: normal  Neck: no JVD, carotid bruits, or masses Cardiac: RRR; no murmurs, rubs, or gallops, 1-2+ edema  Respiratory:  clear to auscultation bilaterally, normal work of breathing GI: soft, nontender, nondistended, + BS MS: no deformity or atrophy  Skin: warm and dry Neuro:  Strength and sensation are intact Psych: euthymic mood, full  affect  EKG:  EKG is ordered today. Personal review of the ekg ordered sinus rhythm, rate 78 shows   Recent Labs: 08/19/2020: BUN 12; Creatinine, Ser 1.30; Potassium 3.6; Sodium 141 11/23/2020: Hemoglobin 13.0; Platelets 190    Lipid Panel     Component Value Date/Time   CHOL 171 05/07/2018 1004   TRIG 204 (H) 05/07/2018 1004   HDL 33 (L) 05/07/2018 1004   CHOLHDL 5.2 (H) 05/07/2018 1004   CHOLHDL 5.4 10/17/2013 1042   VLDL 36 10/17/2013 1042   LDLCALC 97 05/07/2018 1004     Wt Readings from Last 3 Encounters:  02/09/21 260 lb (117.9 kg)  01/25/21 258 lb 9.6 oz (117.3 kg)  12/17/20 262 lb 6.4 oz (119 kg)     Other studies Reviewed: Additional studies/ records that were reviewed today include:  SPECT 05/15/15, TTE 03/31/15 Review of the above records today demonstrates:  SPECT The left ventricular ejection fraction is mildly decreased (45-54%). Nuclear stress EF: 50%. There was no ST segment deviation noted during stress. Defect 1: Moderate sized, mild in intensity, fixed defect in the mid and basal inferior and basal inferolateral wall consistent with diaphragmatic attenuation. No ischemia noted. This is a low risk study.  TTE - Normal LV systolic function; grade 1 diastolic dysfunction; mild LAE.  ETT 01/26/17 - personally reviewed Blood pressure demonstrated a normal response to exercise. There was no ST segment deviation noted during stress. Negative study for exercise induced ischemia. There were frequent PVCs, ventricular couplets and ventricular salvos.   ASSESSMENT AND PLAN:  1.  Paroxysmal atrial fibrillation: Currently on Eliquis and Multaq.  CHA2DS2-VASc of 3.  High risk medication monitoring.  Post ablation 12/10/2015.  Has remained in sinus rhythm.  No changes.  2.  Hypertension: Currently well controlled  3.  Lower extremity edema: Has 1+ lower extremity edema.  This is new for him.  His most recent echo shows grade 1 diastolic dysfunction.  We Jayelle Page give  him 20 mg of Lasix to take for the next 3 days.  He Satia Winger otherwise take Lasix on an as-needed basis.  Current medicines are reviewed at length with the patient today.   The patient does not have concerns regarding his medicines.  The following changes were made today: None  Labs/ tests ordered today include:  Orders Placed This Encounter  Procedures   Basic metabolic panel   EKG [id number]      Disposition:   FU with Kayann Maj 6 months.  Signed, Lani Mendiola [name], MD  02/09/2021 5:08 PM     [place] [address] [place] [address] [phone] (office) [phone] (fax)

## 2021-02-09 NOTE — Patient Instructions (Signed)
Medication Instructions:  Your physician has recommended you make the following change in your medication: START Lasix 20 mg once daily for 3 days, then take once daily as needed for swelling  *If you need a refill on your cardiac medications before your next appointment, please call your pharmacy*   Lab Work: BMET today  If you have labs (blood work) drawn today and your tests are completely normal, you will receive your results only by: [place] (if you have MyChart) OR A paper copy in the mail If you have any lab test that is abnormal or we need to change your treatment, we will call you to review the results.   Testing/Procedures: None ordered   Follow-Up: At [facility], you and your health needs are our priority.  As part of our continuing mission to provide you with exceptional heart care, we have created designated Provider Care Teams.  These Care Teams include your primary Cardiologist (physician) and Advanced Practice Providers (APPs -  Physician Assistants and Nurse Practitioners) who all work together to provide you with the care you need, when you need it.   Your next appointment:   3 month(s)  The format for your next appointment:   In Person  Provider:   Tommye Standard, PA-C    Thank you for choosing [facility]!!   Trinidad Curet, RN [phone]

## 2021-02-10 ENCOUNTER — Ambulatory Visit (INDEPENDENT_AMBULATORY_CARE_PROVIDER_SITE_OTHER): Payer: Medicaid Other | Admitting: *Deleted

## 2021-02-10 ENCOUNTER — Encounter: Payer: Self-pay | Admitting: Family Medicine

## 2021-02-10 ENCOUNTER — Ambulatory Visit: Payer: Medicaid Other | Attending: Family Medicine | Admitting: Family Medicine

## 2021-02-10 VITALS — BP 128/80 | HR 88 | Ht 73.0 in | Wt 255.0 lb

## 2021-02-10 DIAGNOSIS — M5441 Lumbago with sciatica, right side: Secondary | ICD-10-CM | POA: Insufficient documentation

## 2021-02-10 DIAGNOSIS — I480 Paroxysmal atrial fibrillation: Secondary | ICD-10-CM

## 2021-02-10 DIAGNOSIS — Z7901 Long term (current) use of anticoagulants: Secondary | ICD-10-CM | POA: Insufficient documentation

## 2021-02-10 DIAGNOSIS — I10 Essential (primary) hypertension: Secondary | ICD-10-CM | POA: Diagnosis not present

## 2021-02-10 DIAGNOSIS — J309 Allergic rhinitis, unspecified: Secondary | ICD-10-CM

## 2021-02-10 DIAGNOSIS — Z79899 Other long term (current) drug therapy: Secondary | ICD-10-CM

## 2021-02-10 DIAGNOSIS — H209 Unspecified iridocyclitis: Secondary | ICD-10-CM | POA: Diagnosis not present

## 2021-02-10 DIAGNOSIS — J45909 Unspecified asthma, uncomplicated: Secondary | ICD-10-CM | POA: Diagnosis not present

## 2021-02-10 DIAGNOSIS — Z7952 Long term (current) use of systemic steroids: Secondary | ICD-10-CM | POA: Insufficient documentation

## 2021-02-10 DIAGNOSIS — S2232XD Fracture of one rib, left side, subsequent encounter for fracture with routine healing: Secondary | ICD-10-CM

## 2021-02-10 DIAGNOSIS — E1165 Type 2 diabetes mellitus with hyperglycemia: Secondary | ICD-10-CM | POA: Diagnosis not present

## 2021-02-10 DIAGNOSIS — H0510 Unspecified chronic inflammatory disorders of orbit: Secondary | ICD-10-CM | POA: Diagnosis not present

## 2021-02-10 DIAGNOSIS — X58XXXD Exposure to other specified factors, subsequent encounter: Secondary | ICD-10-CM | POA: Insufficient documentation

## 2021-02-10 DIAGNOSIS — R600 Localized edema: Secondary | ICD-10-CM | POA: Diagnosis not present

## 2021-02-10 DIAGNOSIS — Z7951 Long term (current) use of inhaled steroids: Secondary | ICD-10-CM | POA: Insufficient documentation

## 2021-02-10 DIAGNOSIS — G8929 Other chronic pain: Secondary | ICD-10-CM

## 2021-02-10 LAB — BASIC METABOLIC PANEL
BUN/Creatinine Ratio: 14 (ref 9–20)
BUN: 16 mg/dL (ref 6–24)
CO2: 20 mmol/L (ref 20–29)
Calcium: 9.4 mg/dL (ref 8.7–10.2)
Chloride: 100 mmol/L (ref 96–106)
Creatinine, Ser: 1.14 mg/dL (ref 0.76–1.27)
Glucose: 404 mg/dL — ABNORMAL HIGH (ref 65–99)
Potassium: 4.6 mmol/L (ref 3.5–5.2)
Sodium: 137 mmol/L (ref 134–144)
eGFR: 74 mL/min/{1.73_m2} (ref >59)

## 2021-02-10 LAB — POCT GLYCOSYLATED HEMOGLOBIN (HGB A1C): HbA1c, POC (controlled diabetic range): 9 % — AB (ref 0.0–7.0)

## 2021-02-10 MED ORDER — TRAMADOL HCL 50 MG PO TABS: 50.0000 mg | ORAL_TABLET | Freq: Every evening | ORAL | 1 refills | Status: AC | PRN

## 2021-02-10 MED ORDER — DAPAGLIFLOZIN PROPANEDIOL 10 MG PO TABS: 10.0000 mg | ORAL_TABLET | Freq: Every day | ORAL | 3 refills | Status: DC

## 2021-02-10 MED ORDER — ACCU-CHEK AVIVA PLUS W/DEVICE KIT
PACK | 0 refills | Status: DC
Start: 2021-02-10 — End: 2021-12-24

## 2021-02-10 MED ORDER — ACCU-CHEK SOFTCLIX LANCETS MISC: 12 refills | Status: DC

## 2021-02-10 MED ORDER — ATORVASTATIN CALCIUM 40 MG PO TABS
40.0000 mg | ORAL_TABLET | Freq: Every day | ORAL | 6 refills | Status: DC
Start: 2021-02-10 — End: 2021-09-29

## 2021-02-10 MED ORDER — ACCU-CHEK AVIVA PLUS VI STRP: ORAL_STRIP | 12 refills | Status: DC
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
## 2021-02-10 NOTE — Patient Instructions (Addendum)
[facility] ph. # [id number]   Diabetes Mellitus Action Plan Following a diabetes action plan is a way for you to manage your diabetes (diabetes mellitus) symptoms. The plan is color-coded to help you understand what actions you need to take based on any symptoms you are having. If you have symptoms in the red zone, you need medical care right away. If you have symptoms in the yellow zone, you are having problems. If you have symptoms in the green zone, you are doing well. Learning about and understanding diabetes can take time. Follow the plan that you develop with your health care provider. Know the target range for your blood sugar (glucose) level, and review your treatment plan with your health care provider at eachvisit. The target range for my blood sugar level is __________________________ mg/dL. Red zone Get medical help right away if you have any of the following symptoms: A blood sugar test result that is below 54 mg/dL (3 mmol/L). A blood sugar test result that is at or above 240 mg/dL (13.3 mmol/L) for 2 days in a row. Confusion or trouble thinking clearly. Difficulty breathing. Sickness or a fever for 2 or more days that is not getting better. Moderate or large ketone levels in your urine. Feeling tired or having no energy. If you have any red zone symptoms, do not wait to see if the symptoms will go away. Get medical help right away. Call your local emergency services (911 in the [place] Do not drive yourself to the hospital. If you have severely low blood sugar (severe hypoglycemia) and you cannot eat or drink, you may need glucagon. Make sure a family member or close friend knows how to check your blood sugar and how to give youglucagon. You may need to be treated in a hospital for this condition. Yellow zone If you have any of the following symptoms, your diabetes is not under control and you may need to make some changes: A blood sugar test result that is at or above  240 mg/dL (13.3 mmol/L) for 2 days in a row. Blood sugar test results that are below 70 mg/dL (3.9 mmol/L). Other symptoms of hypoglycemia, such as: Shaking or feeling light-headed. Confusion or irritability. Feeling hungry. Having a fast heartbeat. If you have any yellow zone symptoms: Treat your hypoglycemia by eating or drinking 15 grams of a rapid-acting carbohydrate. Follow the 15:15 rule: Take 15 grams of a rapid-acting carbohydrate, such as: 1 tube of glucose gel. 4 glucose pills. 4 oz (120 mL) of fruit juice. 4 oz (120 mL) of regular (not diet) soda. Check your blood sugar 15 minutes after you take the carbohydrate. If the repeat blood sugar test is still at or below 70 mg/dL (3.9 mmol/L), take 15 grams of a carbohydrate again. If your blood sugar does not increase above 70 mg/dL (3.9 mmol/L) after 3 tries, get medical help right away. After your blood sugar returns to normal, eat a meal or a snack within 1 hour. Keep taking your daily medicines as told by your health care provider. Check your blood sugar more often than you normally would. Write down your results. Call your health care provider if you have trouble keeping your blood sugar in your target range.  Green zone These signs mean you are doing well and you can continue what you are doing to manage your diabetes: Your blood sugar is within your personal target range. For most people, a blood sugar level before a meal (preprandial) should be 80-130  mg/dL (4.4-7.2 mmol/L). You feel well, and you are able to do daily activities. If you are in the green zone, continue to manage your diabetes as told by your health care provider. To do this: Eat a healthy diet. Exercise regularly. Check your blood sugar as told by your health care provider. Take your medicines as told by your health care provider.  Where to find more information American Diabetes Association (ADA): diabetes.org Association of Diabetes Care & Education  Specialists (ADCES): diabeteseducator.org Summary Following a diabetes action plan is a way for you to manage your diabetes symptoms. The plan is color-coded to help you understand what actions you need to take based on any symptoms you are having. Follow the plan that you develop with your health care provider. Make sure you know your personal target blood sugar level. Review your treatment plan with your health care provider at each visit. This information is not intended to replace advice given to you by your health care provider. Make sure you discuss any questions you have with your healthcare provider. Document Revised: 01/09/2020 Document Reviewed: 01/09/2020 Elsevier Patient Education  [place].

## 2021-02-10 NOTE — Progress Notes (Signed)
Feet swelling, was prescribed lasix from heart doctor has not picked up medication yet. Wants referral to kidney doctor, states he takes to many medications. Still having pain in ribs.

## 2021-02-11 ENCOUNTER — Encounter: Payer: Self-pay | Admitting: Physical Therapy

## 2021-02-11 ENCOUNTER — Other Ambulatory Visit: Payer: Self-pay | Admitting: Family Medicine

## 2021-02-11 ENCOUNTER — Other Ambulatory Visit: Payer: Self-pay

## 2021-02-11 ENCOUNTER — Telehealth: Payer: Self-pay

## 2021-02-11 ENCOUNTER — Ambulatory Visit: Payer: Medicaid Other | Admitting: Physical Therapy

## 2021-02-11 DIAGNOSIS — M542 Cervicalgia: Secondary | ICD-10-CM | POA: Diagnosis not present

## 2021-02-11 DIAGNOSIS — M545 Low back pain, unspecified: Secondary | ICD-10-CM

## 2021-02-11 DIAGNOSIS — M6281 Muscle weakness (generalized): Secondary | ICD-10-CM

## 2021-02-11 MED ORDER — EMPAGLIFLOZIN 10 MG PO TABS
10.0000 mg | ORAL_TABLET | Freq: Every day | ORAL | 3 refills | Status: DC
Start: 2021-02-11 — End: 2021-04-26

## 2021-02-11 NOTE — Therapy (Signed)
[place], [place], [address] Phone: [phone]   Fax:  [phone]  Physical Therapy Treatment  Patient Details  Name: Jason Stokes MRN: [id number] Date of Birth: 07-30-1960 Referring Provider (PT): Charlott Rakes, MD   Encounter Date: 02/11/2021   PT End of Session - 02/11/21 0929     Visit Number 6    Number of Visits 8    Date for PT Re-Evaluation 02/22/21    Authorization Type Med Pay / MCD    Authorization Time Period 01/28/2021 - 02/24/2021    Authorization - Visit Number 2    Authorization - Number of Visits 4    PT Start Time 0922    PT Stop Time 1000    PT Time Calculation (min) 38 min    Activity Tolerance Patient limited by pain;Patient tolerated treatment well    Behavior During Therapy [facility] for tasks assessed/performed             Past Medical History:  Diagnosis Date   Anxiety    Bilateral carpal tunnel syndrome 01/10/2018   Bipolar disorder ([place])    Chronic lower back pain    Depression    Dysrhythmia    a-fib   GERD (gastroesophageal reflux disease)    History of alcohol abuse    History of nuclear stress test    Myoview 10/16: EF 50%, diaphragmatic attenuation, no ischemia, low risk   Hypertension    Migraine 2012-2014   Moderate persistent asthma with acute exacerbation 05/02/2018   PAF (paroxysmal atrial fibrillation) ([place]) 03/27/2015   a. Myoview neg for ischemia >> Flecainide started 10/16 >> FU ETT    Schizophrenia ([place])    Sciatica neuralgia    Small vessel disease ([place])    Right basal ganglia stroke   Stroke [facility]) "between 2012-2014"   residual "AF" (09/22/2015)    Past Surgical History:  Procedure Laterality Date   ATRIAL FIBRILLATION ABLATION  09/22/2015   CYST EXCISION  1996-97   surgery back of head    ELECTROPHYSIOLOGIC STUDY N/A 09/22/2015   Procedure: Atrial Fibrillation Ablation;  Surgeon: Will [name], MD;  Location: [place] [facility];   Service: Cardiovascular;  Laterality: N/A;   ELECTROPHYSIOLOGIC STUDY N/A 12/10/2015   Procedure: Atrial Fibrillation Ablation;  Surgeon: Will [name], MD;  Location: [place] [facility];  Service: Cardiovascular;  Laterality: N/A;   ELECTROPHYSIOLOGIC STUDY N/A 12/11/2015   Procedure: Cardioversion;  Surgeon: Will [name], MD;  Location: [place] [facility];  Service: Cardiovascular;  Laterality: N/A;   EMBOLIZATION Right 08/19/2020   Procedure: EMBOLIZATION;  Surgeon: Cherre Robins, MD;  Location: [place] [facility];  Service: Cardiovascular;  Laterality: Right;  hypogastric   EXCISION MASS HEAD N/A 01/06/2017   Procedure: EXCISION MASS FOREHEAD;  Surgeon: Irene Limbo, MD;  Location: [place];  Service: Plastics;  Laterality: N/A;   GANGLION CYST EXCISION Left    INTERCOSTAL NERVE BLOCK  2005   KNEE ARTHROSCOPY Right 2016   MASS EXCISION N/A 01/25/2021   Procedure: EXCISION SUBCUTANEOUS VS SUBFASCIAL MASS TORSO 3CM;  Surgeon: Irene Limbo, MD;  Location: [place];  Service: Plastics;  Laterality: N/A;    There were no vitals filed for this visit.   Subjective Assessment - 02/11/21 0927     Subjective Patient reports his hips and back are bothering him more this morning, states lower back started bothering him more yesterday.    Patient Stated  Goals Patient wants to be able to move better with less pain    Currently in Pain? Yes    Pain Score 8     Pain Location Neck    Pain Orientation Right    Pain Descriptors / Indicators Tightness;Aching    Pain Type Chronic pain    Pain Onset More than a month ago    Pain Frequency Constant    Pain Score 9    Pain Location Back    Pain Orientation Lower    Pain Descriptors / Indicators Tightness;Pressure    Pain Type Chronic pain    Pain Onset More than a month ago    Pain Frequency Constant                [facility] PT Assessment - 02/11/21 0001       AROM   Lumbar Flexion 25%     Lumbar - Right Rotation 50%    Lumbar - Left Rotation 50%                           [facility] Adult PT Treatment/Exercise - 02/11/21 0001       Exercises   Exercises Neck;Lumbar      Lumbar Exercises: Stretches   Passive Hamstring Stretch 3 reps;30 seconds    Passive Hamstring Stretch Limitations seated egde of mat    Lower Trunk Rotation 5 reps;10 seconds    Hip Flexor Stretch 2 reps;60 seconds    Hip Flexor Stretch Limitations modified thomas PROM    Piriformis Stretch 3 reps;30 seconds    Piriformis Stretch Limitations hooklying figure-4      Lumbar Exercises: Supine   Clam 10 reps   2 sets   Clam Limitations red    Bridge 5 reps;3 seconds   2 sets   Straight Leg Raise 5 reps   2 sets   Straight Leg Raises Limitations cued for abdominal engagement    Large Ball Abdominal Isometric 10 reps;5 seconds      Neck Exercises: Stretches   Upper Trapezius Stretch 2 reps;30 seconds    Levator Stretch 2 reps;30 seconds                    PT Education - 02/11/21 0928     Education Details HEP    Person(s) Educated Patient    Methods Explanation;Demonstration;Verbal cues    Comprehension Verbalized understanding;Returned demonstration;Verbal cues required;Need further instruction              PT Short Term Goals - 02/05/21 0859       PT SHORT TERM GOAL #1   Title Patient will be I with initial HEP to progress with PT    Baseline patient is independent with initial HEP    Time 4    Period Weeks    Status Achieved    Target Date 01/25/21      PT SHORT TERM GOAL #2   Title Patient will report </= 6/10 pain to improve mobility and performance of household tasks    Baseline patient continues to report 8/10 pain    Time 4    Period Weeks    Status On-going    Target Date 01/25/21      PT SHORT TERM GOAL #3   Title Patient will demonstrate 5 deg improvement in all planes of cervical motion to indicate reduced muscular tension and pain     Baseline Cervical flexion 20 ->  30 deg, extension 15 -> 35 deg, right rotation 45 -> 50 deg, left rotation 60 -> 70 deg, right sidebend 10 -> 25 deg, left sidebend 20 -> 40 deg    Time 4    Period Weeks    Status Achieved    Target Date 01/25/21               PT Long Term Goals - 01/21/21 1237       PT LONG TERM GOAL #1   Title Patient will be I with final HEP to maintain progress from PT    Baseline patient is progressing toward final HEP    Time 8    Period Weeks    Status On-going    Target Date 02/22/21      PT LONG TERM GOAL #2   Title Patient will report </= 4/10 pain level with household activity and ambulation to reduce functional limitation    Baseline patient continues to report 8-9/10 pain levels    Time 8    Period Weeks    Status On-going    Target Date 02/22/21      PT LONG TERM GOAL #3   Title Patient will be able to walk >/= 30 minutes without requiring rest due to pain in order to improve community access    Baseline patient reports 15-20 min of walking before needing break due to pain    Time 8    Period Weeks    Status On-going    Target Date 02/22/21      PT LONG TERM GOAL #4   Title Patient will be able to sit >/= 45 minutes without having to change positions to improve leisure    Baseline patient reports typically 30 minutes before getting up do to pain    Time 8    Period Weeks    Status On-going    Target Date 02/22/21      PT LONG TERM GOAL #5   Title Patient will demonstrate improved strength >/= 4+/5 MMT throughout BLE and BUE in order to improve performance of household tasks    Baseline patient continues to exhibit strenth deficit grossly </= 4+/5 MMT throughout BLE and BUE    Time 8    Period Weeks    Status On-going    Target Date 02/22/21                   Plan - 02/11/21 0944     Clinical Impression Statement Patient arrived reporting increased lumbar pain and tightness so majority of therapy focused on stretching  and mobility exercises followed by light strengthening. He continues to demonstrate limitations with lumbar motion but responds well to therapy and reports improvement in symptoms following exercise. patient encouraged to work on consistency with exercises at home. Patient would benefit from continued skilled PT to progress his mobility and strength in order to reduce pain and maximize functional ability with walking and ADLs.    PT Treatment/Interventions ADLs/Self Care Home Management;Aquatic Therapy;Cryotherapy;Electrical Stimulation;Iontophoresis '4mg'$ /ml Dexamethasone;Moist Heat;Traction;Ultrasound;Neuromuscular re-education;Balance training;Therapeutic exercise;Therapeutic activities;Functional mobility training;Stair training;Gait training;Patient/family education;Manual techniques;Dry needling;Passive range of motion;Taping;Vasopneumatic Device;Spinal Manipulations;Joint Manipulations    PT Next Visit Plan Review HEP and progress PRN, manual for neck and lower back as tolerated to improve motion and reduce pain, progress light mobility and strengthening for posture and core    PT Home Exercise Plan [place]    Consulted and Agree with Plan of Care Patient  Patient will benefit from skilled therapeutic intervention in order to improve the following deficits and impairments:  Abnormal gait, Decreased range of motion, Difficulty walking, Decreased activity tolerance, Increased muscle spasms, Pain, Decreased balance, Impaired flexibility, Postural dysfunction, Improper body mechanics, Decreased strength, Impaired sensation  Visit Diagnosis: Chronic bilateral low back pain, unspecified whether sciatica present  Cervicalgia  Muscle weakness (generalized)     Problem List Patient Active Problem List   Diagnosis Date Noted   Type 2 diabetes mellitus with hyperglycemia, without long-term current use of insulin ([place]) 02/10/2021   Neck pain on left side 03/06/2019   Musculoskeletal  chest pain 07/24/2018   Asthma, mild intermittent 05/02/2018   Allergic rhinitis caused by mold 05/02/2018   Tobacco use 05/02/2018   Bilateral carpal tunnel syndrome 01/10/2018   Stroke ([place])    Schizophrenia ([place])    Migraine    History of nuclear stress test    History of alcohol abuse    GERD (gastroesophageal reflux disease)    Dysrhythmia    Chronic lower back pain    Anxiety    Arthritis of knee, right 04/12/2017   Bipolar disorder ([place]) 04/03/2017   Lipoma of forehead 09/22/2016   Trigger ring finger of right hand 06/22/2016   Paroxysmal atrial fibrillation ([facility])    AF (atrial fibrillation) ([place]) 12/10/2015   Eczema 07/10/2015   History of CVA (cerebrovascular accident) 05/04/2015   Tear of medial meniscus of right knee 03/18/2015   Chronic pain of right knee 03/12/2015   Other and unspecified hyperlipidemia 11/25/2013   Nasal congestion 11/25/2013   Sinusitis, chronic 05/09/2013   Chronic low back pain 10/12/2012   Lumbar radiculopathy 10/12/2012   Hypertension 10/12/2012   Depression 10/12/2012   Insomnia 10/12/2012    Hilda Blades, PT, DPT, LAT, ATC 02/11/21  10:02 AM Phone: [phone] Fax: [place] [facility] [address] [place], [place], [address] Phone: [phone]   Fax:  [phone]  Name: SHAYD BERGKAMP MRN: [id number] Date of Birth: January 18, 1961

## 2021-02-11 NOTE — Telephone Encounter (Signed)
I have switched to [facility]

## 2021-02-11 NOTE — Telephone Encounter (Signed)
Notes to clinic:  Alternative Requested:PA NEEDED   Requested Prescriptions  Pending Prescriptions Disp Refills   JARDIANCE 10 MG TABS tablet [Pharmacy Med Name: JARDIANCE 10 MG TABLET] 30 tablet 3    Sig: TAKE 1 TABLET BY [place] DAY      Endocrinology:  Diabetes - SGLT2 Inhibitors Failed - 02/11/2021  1:48 PM      Failed - LDL in normal range and within 360 days    LDL Calculated  Date Value Ref Range Status  05/07/2018 97 0 - 99 mg/dL Final          Failed - HBA1C is between 0 and 7.9 and within 180 days    HbA1c, POC (controlled diabetic range)  Date Value Ref Range Status  02/10/2021 9.0 (A) 0.0 - 7.0 % Final          Failed - AA eGFR in normal range and within 360 days    GFR, Est African American  Date Value Ref Range Status  10/04/2013 75 mL/min Final   GFR calc Af Amer  Date Value Ref Range Status  01/03/2020 79 >59 mL/min/1.73 Final    Comment:    **[facility] currently reports eGFR in compliance with the current**   recommendations of the [employer]. [facility] will   update reporting as new guidelines are published from the NKF-ASN   Task force.    GFR, Est Non African American  Date Value Ref Range Status  10/04/2013 65 mL/min Final    Comment:      The estimated GFR is a calculation valid for adults (>=7 years old) that uses the CKD-EPI algorithm to adjust for age and sex. It is   not to be used for children, pregnant women, hospitalized patients,    patients on dialysis, or with rapidly changing kidney function. According to the NKDEP, eGFR >89 is normal, 60-89 shows mild impairment, 30-59 shows moderate impairment, 15-29 shows severe impairment and <15 is ESRD.     GFR calc non Af Amer  Date Value Ref Range Status  01/03/2020 68 >59 mL/min/1.73 Final   GFR  Date Value Ref Range Status  03/27/2015 81.12 >60.00 mL/min Final   eGFR  Date Value Ref Range Status  02/09/2021 74 >59 mL/min/1.73 Final          Passed - Cr in  normal range and within 360 days    Creat  Date Value Ref Range Status  11/30/2015 1.48 (H) 0.70 - 1.33 mg/dL Final   Creatinine, Ser  Date Value Ref Range Status  02/09/2021 1.14 0.76 - 1.27 mg/dL Final          Passed - Valid encounter within last 6 months    Recent Outpatient Visits           Yesterday Type 2 diabetes mellitus with hyperglycemia, without long-term current use of insulin ([place])   [place], [place], MD   4 weeks ago Chronic midline low back pain with right-sided sciatica   [place], [place], [place]   3 months ago Lipoma of torso   [place], [place], [place]   7 months ago Aneurysm of internal iliac artery [facility])   [place], [place], MD   8 months ago Other chronic sinusitis   [place], [place], MD       Future Appointments  In 3 months Koz[place], Donnamarie Poag, MD Allergy and [place]

## 2021-02-11 NOTE — Telephone Encounter (Signed)
Wilder Glade changed to [place] due to insurance preference.

## 2021-02-11 NOTE — Telephone Encounter (Signed)
Wilder Glade is non-preferred on patient's ins.  If appropriate, can therapy be changed to preferred-Jardiance or [facility]?

## 2021-02-12 ENCOUNTER — Telehealth: Payer: Self-pay

## 2021-02-12 DIAGNOSIS — I480 Paroxysmal atrial fibrillation: Secondary | ICD-10-CM | POA: Diagnosis not present

## 2021-02-12 NOTE — Telephone Encounter (Signed)
-----   Message from Will [name], MD sent at 02/12/2021 12:08 PM EDT ----- Glucose significantly elevated. Needs PCP follow up to discuss.

## 2021-02-12 NOTE — Telephone Encounter (Signed)
Spoke with pt and advised per Dr Curt Bears labs show elevated glucose that will need PCP follow up.  Pt verbalized understanding and thanked Therapist, sports for the call.

## 2021-02-13 DIAGNOSIS — I480 Paroxysmal atrial fibrillation: Secondary | ICD-10-CM | POA: Diagnosis not present

## 2021-02-14 DIAGNOSIS — I480 Paroxysmal atrial fibrillation: Secondary | ICD-10-CM | POA: Diagnosis not present

## 2021-02-15 ENCOUNTER — Other Ambulatory Visit: Payer: Self-pay

## 2021-02-15 DIAGNOSIS — I480 Paroxysmal atrial fibrillation: Secondary | ICD-10-CM | POA: Diagnosis not present

## 2021-02-16 DIAGNOSIS — I480 Paroxysmal atrial fibrillation: Secondary | ICD-10-CM | POA: Diagnosis not present

## 2021-02-17 DIAGNOSIS — I480 Paroxysmal atrial fibrillation: Secondary | ICD-10-CM | POA: Diagnosis not present

## 2021-02-18 ENCOUNTER — Encounter: Payer: Self-pay | Admitting: Physical Therapy

## 2021-02-18 ENCOUNTER — Other Ambulatory Visit: Payer: Self-pay

## 2021-02-18 ENCOUNTER — Ambulatory Visit: Payer: Medicaid Other | Attending: Family Medicine | Admitting: Physical Therapy

## 2021-02-18 DIAGNOSIS — G8929 Other chronic pain: Secondary | ICD-10-CM | POA: Diagnosis not present

## 2021-02-18 DIAGNOSIS — M545 Low back pain, unspecified: Secondary | ICD-10-CM | POA: Insufficient documentation

## 2021-02-18 DIAGNOSIS — M6281 Muscle weakness (generalized): Secondary | ICD-10-CM | POA: Diagnosis not present

## 2021-02-18 DIAGNOSIS — M542 Cervicalgia: Secondary | ICD-10-CM | POA: Insufficient documentation

## 2021-02-18 DIAGNOSIS — I480 Paroxysmal atrial fibrillation: Secondary | ICD-10-CM | POA: Diagnosis not present

## 2021-02-18 NOTE — Therapy (Signed)
[place], [place], [address] Phone: [phone]   Fax:  [phone]  Physical Therapy Treatment  Patient Details  Name: Jason Stokes MRN: [id number] Date of Birth: [birth year] Referring Provider (PT): Charlott Rakes, MD   Encounter Date: 02/18/2021   PT End of Session - 02/18/21 0931     Visit Number 7    Number of Visits 8    Date for PT Re-Evaluation 02/22/21    Authorization Type Med Pay / MCD    Authorization Time Period 01/28/2021 - 02/24/2021    Authorization - Visit Number 3    Authorization - Number of Visits 4    PT Start Time 0922    PT Stop Time 1000    PT Time Calculation (min) 38 min    Activity Tolerance Patient limited by pain;Patient tolerated treatment well    Behavior During Therapy [facility] for tasks assessed/performed             Past Medical History:  Diagnosis Date   Anxiety    Bilateral carpal tunnel syndrome 01/10/2018   Bipolar disorder ([place])    Chronic lower back pain    Depression    Dysrhythmia    a-fib   GERD (gastroesophageal reflux disease)    History of alcohol abuse    History of nuclear stress test    Myoview 10/16: EF 50%, diaphragmatic attenuation, no ischemia, low risk   Hypertension    Migraine 2012-2014   Moderate persistent asthma with acute exacerbation 05/02/2018   PAF (paroxysmal atrial fibrillation) ([place]) 03/27/2015   a. Myoview neg for ischemia >> Flecainide started 10/16 >> FU ETT    Schizophrenia ([place])    Sciatica neuralgia    Small vessel disease ([employer])    Right basal ganglia stroke   Stroke [facility]) "between 2012-2014"   residual "AF" (09/22/2015)    Past Surgical History:  Procedure Laterality Date   ATRIAL FIBRILLATION ABLATION  09/22/2015   CYST EXCISION  1996-97   surgery back of head    ELECTROPHYSIOLOGIC STUDY N/A 09/22/2015   Procedure: Atrial Fibrillation Ablation;  Surgeon: Will [name], MD;  Location: [place] [facility];   Service: Cardiovascular;  Laterality: N/A;   ELECTROPHYSIOLOGIC STUDY N/A 12/10/2015   Procedure: Atrial Fibrillation Ablation;  Surgeon: Will [name], MD;  Location: [place] [facility];  Service: Cardiovascular;  Laterality: N/A;   ELECTROPHYSIOLOGIC STUDY N/A 12/11/2015   Procedure: Cardioversion;  Surgeon: Will [name], MD;  Location: [place] [facility];  Service: Cardiovascular;  Laterality: N/A;   EMBOLIZATION Right 08/19/2020   Procedure: EMBOLIZATION;  Surgeon: Cherre Robins, MD;  Location: [place] [facility];  Service: Cardiovascular;  Laterality: Right;  hypogastric   EXCISION MASS HEAD N/A 01/06/2017   Procedure: EXCISION MASS FOREHEAD;  Surgeon: Irene Limbo, MD;  Location: [place];  Service: Plastics;  Laterality: N/A;   GANGLION CYST EXCISION Left    INTERCOSTAL NERVE BLOCK  2005   KNEE ARTHROSCOPY Right 2016   MASS EXCISION N/A 01/25/2021   Procedure: EXCISION SUBCUTANEOUS VS SUBFASCIAL MASS TORSO 3CM;  Surgeon: Irene Limbo, MD;  Location: [place];  Service: Plastics;  Laterality: N/A;    There were no vitals filed for this visit.   Subjective Assessment - 02/18/21 0930     Subjective Patient reports he is doing well, feeling better today and not using his cane.    Patient Stated Goals Patient wants to be able  to move better with less pain    Currently in Pain? Yes    Pain Score 7     Pain Location Neck    Pain Orientation Right    Pain Descriptors / Indicators Aching;Tightness    Pain Type Chronic pain    Pain Onset More than a month ago    Pain Frequency Constant    Pain Score 7    Pain Location Back    Pain Orientation Lower    Pain Descriptors / Indicators Tightness    Pain Type Chronic pain    Pain Onset More than a month ago    Pain Frequency Constant                [facility] PT Assessment - 02/18/21 0001       AROM   Cervical - Right Rotation 55    Cervical - Left Rotation 70                            [facility] Adult PT Treatment/Exercise - 02/18/21 0001       Exercises   Exercises Neck;Lumbar      Neck Exercises: Theraband   Horizontal ABduction 10 reps;Green   2 sets   Horizontal ABduction Limitations supine      Neck Exercises: Stretches   Upper Trapezius Stretch 2 reps;20 seconds    Levator Stretch 2 reps;20 seconds      Lumbar Exercises: Stretches   Passive Hamstring Stretch 2 reps;20 seconds    Passive Hamstring Stretch Limitations seated egde of mat    Lower Trunk Rotation 5 reps;10 seconds    Hip Flexor Stretch 3 reps;20 seconds    Hip Flexor Stretch Limitations modified thomas PROM    Piriformis Stretch 2 reps;20 seconds    Piriformis Stretch Limitations hooklying figure-4    Other Lumbar Stretch Exercise Seated lumbar flexion physioball rollout 5 x 10 sec      Lumbar Exercises: Aerobic   Nustep L5 x 5 min with UE/LE while taking subjective      Lumbar Exercises: Supine   Clam 10 reps   2 sets   Clam Limitations green    Bridge 5 reps;3 seconds   2 sets   Straight Leg Raise 5 reps   2 sets   Straight Leg Raises Limitations cued for abdominal engagement                    PT Education - 02/18/21 0931     Education Details HEP    Person(s) Educated Patient    Methods Explanation;Demonstration;Verbal cues    Comprehension Verbalized understanding;Returned demonstration;Verbal cues required;Need further instruction              PT Short Term Goals - 02/05/21 0859       PT SHORT TERM GOAL #1   Title Patient will be I with initial HEP to progress with PT    Baseline patient is independent with initial HEP    Time 4    Period Weeks    Status Achieved    Target Date 01/25/21      PT SHORT TERM GOAL #2   Title Patient will report </= 6/10 pain to improve mobility and performance of household tasks    Baseline patient continues to report 8/10 pain    Time 4    Period Weeks    Status On-going    Target Date  01/25/21  PT SHORT TERM GOAL #3   Title Patient will demonstrate 5 deg improvement in all planes of cervical motion to indicate reduced muscular tension and pain    Baseline Cervical flexion 20 -> 30 deg, extension 15 -> 35 deg, right rotation 45 -> 50 deg, left rotation 60 -> 70 deg, right sidebend 10 -> 25 deg, left sidebend 20 -> 40 deg    Time 4    Period Weeks    Status Achieved    Target Date 01/25/21               PT Long Term Goals - 01/21/21 1237       PT LONG TERM GOAL #1   Title Patient will be I with final HEP to maintain progress from PT    Baseline patient is progressing toward final HEP    Time 8    Period Weeks    Status On-going    Target Date 02/22/21      PT LONG TERM GOAL #2   Title Patient will report </= 4/10 pain level with household activity and ambulation to reduce functional limitation    Baseline patient continues to report 8-9/10 pain levels    Time 8    Period Weeks    Status On-going    Target Date 02/22/21      PT LONG TERM GOAL #3   Title Patient will be able to walk >/= 30 minutes without requiring rest due to pain in order to improve community access    Baseline patient reports 15-20 min of walking before needing break due to pain    Time 8    Period Weeks    Status On-going    Target Date 02/22/21      PT LONG TERM GOAL #4   Title Patient will be able to sit >/= 45 minutes without having to change positions to improve leisure    Baseline patient reports typically 30 minutes before getting up do to pain    Time 8    Period Weeks    Status On-going    Target Date 02/22/21      PT LONG TERM GOAL #5   Title Patient will demonstrate improved strength >/= 4+/5 MMT throughout BLE and BUE in order to improve performance of household tasks    Baseline patient continues to exhibit strenth deficit grossly </= 4+/5 MMT throughout BLE and BUE    Time 8    Period Weeks    Status On-going    Target Date 02/22/21                    Plan - 02/18/21 0931     Clinical Impression Statement Patient arrived reporting improvement in his neck and back pain and without using his cane. He tolerated therapy well with no adverse effects. Continued focus of therapy on mobility and stretching exercises and light core stabilization and postural control. Patient has been consistent with exercises over the past week and progressing his walking which seems to be contributing to his improvement in symtpoms. He did exhibit continued improvement in neck motion this visit. Likely plan for discharge next visit if he continues to improve. Patient would benefit from continued skilled PT to progress his mobility and strength in order to reduce pain and maximize functional ability with walking and ADLs.    PT Treatment/Interventions ADLs/Self Care Home Management;Aquatic Therapy;Cryotherapy;Electrical Stimulation;Iontophoresis '4mg'$ /ml Dexamethasone;Moist Heat;Traction;Ultrasound;Neuromuscular re-education;Balance training;Therapeutic exercise;Therapeutic activities;Functional mobility training;Stair training;Gait training;Patient/family education;Manual techniques;Dry needling;Passive  range of motion;Taping;Vasopneumatic Device;Spinal Manipulations;Joint Manipulations    PT Next Visit Plan Review HEP and progress PRN, manual for neck and lower back as tolerated to improve motion and reduce pain, progress light mobility and strengthening for posture and core    PT Home Exercise Plan [facility]    Consulted and Agree with Plan of Care Patient             Patient will benefit from skilled therapeutic intervention in order to improve the following deficits and impairments:  Abnormal gait, Decreased range of motion, Difficulty walking, Decreased activity tolerance, Increased muscle spasms, Pain, Decreased balance, Impaired flexibility, Postural dysfunction, Improper body mechanics, Decreased strength, Impaired sensation  Visit  Diagnosis: Chronic bilateral low back pain, unspecified whether sciatica present  Cervicalgia  Muscle weakness (generalized)     Problem List Patient Active Problem List   Diagnosis Date Noted   Type 2 diabetes mellitus with hyperglycemia, without long-term current use of insulin ([place]) 02/10/2021   Neck pain on left side 03/06/2019   Musculoskeletal chest pain 07/24/2018   Asthma, mild intermittent 05/02/2018   Allergic rhinitis caused by mold 05/02/2018   Tobacco use 05/02/2018   Bilateral carpal tunnel syndrome 01/10/2018   Stroke ([facility])    Schizophrenia ([facility])    Migraine    History of nuclear stress test    History of alcohol abuse    GERD (gastroesophageal reflux disease)    Dysrhythmia    Chronic lower back pain    Anxiety    Arthritis of knee, right 04/12/2017   Bipolar disorder ([place]) 04/03/2017   Lipoma of forehead 09/22/2016   Trigger ring finger of right hand 06/22/2016   Paroxysmal atrial fibrillation ([facility])    AF (atrial fibrillation) ([place]) 12/10/2015   Eczema 07/10/2015   History of CVA (cerebrovascular accident) 05/04/2015   Tear of medial meniscus of right knee 03/18/2015   Chronic pain of right knee 03/12/2015   Other and unspecified hyperlipidemia 11/25/2013   Nasal congestion 11/25/2013   Sinusitis, chronic 05/09/2013   Chronic low back pain 10/12/2012   Lumbar radiculopathy 10/12/2012   Hypertension 10/12/2012   Depression 10/12/2012   Insomnia 10/12/2012    Hilda Blades, PT, DPT, LAT, ATC 02/18/21  10:01 AM Phone: [phone] Fax: [facility] [address] [place], [place], [address] Phone: [phone]   Fax:  [phone]  Name: Jason Stokes MRN: [id number] Date of Birth: 02-21-1961

## 2021-02-19 DIAGNOSIS — I480 Paroxysmal atrial fibrillation: Secondary | ICD-10-CM | POA: Diagnosis not present

## 2021-02-20 DIAGNOSIS — I480 Paroxysmal atrial fibrillation: Secondary | ICD-10-CM | POA: Diagnosis not present

## 2021-02-21 DIAGNOSIS — I480 Paroxysmal atrial fibrillation: Secondary | ICD-10-CM | POA: Diagnosis not present

## 2021-02-22 DIAGNOSIS — I480 Paroxysmal atrial fibrillation: Secondary | ICD-10-CM | POA: Diagnosis not present

## 2021-02-23 ENCOUNTER — Encounter: Payer: Self-pay | Admitting: Physical Therapy

## 2021-02-23 ENCOUNTER — Ambulatory Visit: Payer: Medicaid Other | Admitting: Physical Therapy

## 2021-02-23 ENCOUNTER — Other Ambulatory Visit: Payer: Self-pay

## 2021-02-23 DIAGNOSIS — M6281 Muscle weakness (generalized): Secondary | ICD-10-CM | POA: Diagnosis not present

## 2021-02-23 DIAGNOSIS — G8929 Other chronic pain: Secondary | ICD-10-CM

## 2021-02-23 DIAGNOSIS — M545 Low back pain, unspecified: Secondary | ICD-10-CM

## 2021-02-23 DIAGNOSIS — M542 Cervicalgia: Secondary | ICD-10-CM

## 2021-02-23 DIAGNOSIS — I480 Paroxysmal atrial fibrillation: Secondary | ICD-10-CM | POA: Diagnosis not present

## 2021-02-23 NOTE — Therapy (Signed)
[place], [place], [address] Phone: [phone]   Fax:  [phone]  Physical Therapy Treatment / Discharge  Patient Details  Name: Jason Stokes MRN: [id number] Date of Birth: [birth year] Referring Provider (PT): Charlott Rakes, MD   Encounter Date: 02/23/2021   PT End of Session - 02/23/21 0938     Visit Number 8    Number of Visits 8    Date for PT Re-Evaluation 02/22/21    Authorization Type Med Pay / MCD    Authorization Time Period 01/28/2021 - 02/24/2021    Authorization - Visit Number 4    Authorization - Number of Visits 4    PT Start Time 0933   patient arrived late   PT Stop Time 1000    PT Time Calculation (min) 27 min    Activity Tolerance Patient tolerated treatment well    Behavior During Therapy [name] for tasks assessed/performed             Past Medical History:  Diagnosis Date   Anxiety    Bilateral carpal tunnel syndrome 01/10/2018   Bipolar disorder ([place])    Chronic lower back pain    Depression    Dysrhythmia    a-fib   GERD (gastroesophageal reflux disease)    History of alcohol abuse    History of nuclear stress test    Myoview 10/16: EF 50%, diaphragmatic attenuation, no ischemia, low risk   Hypertension    Migraine 2012-2014   Moderate persistent asthma with acute exacerbation 05/02/2018   PAF (paroxysmal atrial fibrillation) ([place]) 03/27/2015   a. Myoview neg for ischemia >> Flecainide started 10/16 >> FU ETT    Schizophrenia ([place])    Sciatica neuralgia    Small vessel disease ([place])    Right basal ganglia stroke   Stroke [facility]) "between 2012-2014"   residual "AF" (09/22/2015)    Past Surgical History:  Procedure Laterality Date   ATRIAL FIBRILLATION ABLATION  09/22/2015   CYST EXCISION  1996-97   surgery back of head    ELECTROPHYSIOLOGIC STUDY N/A 09/22/2015   Procedure: Atrial Fibrillation Ablation;  Surgeon: Will [name], MD;  Location: [place] [facility];  Service: Cardiovascular;  Laterality: N/A;   ELECTROPHYSIOLOGIC STUDY N/A 12/10/2015   Procedure: Atrial Fibrillation Ablation;  Surgeon: Will [name], MD;  Location: [place] [facility];  Service: Cardiovascular;  Laterality: N/A;   ELECTROPHYSIOLOGIC STUDY N/A 12/11/2015   Procedure: Cardioversion;  Surgeon: Will [name], MD;  Location: [place] [facility];  Service: Cardiovascular;  Laterality: N/A;   EMBOLIZATION Right 08/19/2020   Procedure: EMBOLIZATION;  Surgeon: Cherre Robins, MD;  Location: [place] [facility];  Service: Cardiovascular;  Laterality: Right;  hypogastric   EXCISION MASS HEAD N/A 01/06/2017   Procedure: EXCISION MASS FOREHEAD;  Surgeon: Irene Limbo, MD;  Location: [place];  Service: Plastics;  Laterality: N/A;   GANGLION CYST EXCISION Left    INTERCOSTAL NERVE BLOCK  2005   KNEE ARTHROSCOPY Right 2016   MASS EXCISION N/A 01/25/2021   Procedure: EXCISION SUBCUTANEOUS VS SUBFASCIAL MASS TORSO 3CM;  Surgeon: Irene Limbo, MD;  Location: [place];  Service: Plastics;  Laterality: N/A;    There were no vitals filed for this visit.   Subjective Assessment - 02/23/21 0935     Subjective Patient reports he is doing well. He arrives today without using his cane and states he is feeling about back to his  baseline prior to the accident, previous baseline was approximately 6/10 pain level.    How long can you sit comfortably? about 30 minutes before needing to change positions    How long can you walk comfortably? can walk 2.5 miles, approximately 3 hours, takes a few breaks along the way    Patient Stated Goals Patient wants to be able to move better with less pain    Currently in Pain? Yes    Pain Score 7     Pain Location Neck    Pain Orientation Right    Pain Descriptors / Indicators Aching;Tightness    Pain Type Chronic pain    Pain Onset More than a month ago    Pain Frequency Constant    Pain Score 7     Pain Location Back    Pain Orientation Lower    Pain Descriptors / Indicators Tightness    Pain Type Chronic pain    Pain Onset More than a month ago    Pain Frequency Constant                [facility] PT Assessment - 02/23/21 0001       Assessment   Medical Diagnosis Neck pain    Referring Provider (PT) Charlott Rakes, MD    Onset Date/Surgical Date 12/04/20      Precautions   Precautions None      Restrictions   Weight Bearing Restrictions No      Balance Screen   Has the patient fallen in the past 6 months No      Prior Function   Level of Independence Independent      Cognition   Overall Cognitive Status Within Functional Limits for tasks assessed      Observation/Other Assessments   Observations Patient appears in no apparent distress    Focus on Therapeutic Outcomes (FOTO)  NA - MCD      Strength   Overall Strength Comments Periscapular strength grossly 4/5 MMT bilaterally assessed seated    Right Shoulder Flexion 5/5    Right Shoulder ABduction 4+/5    Right Shoulder External Rotation 4+/5    Left Shoulder Flexion 5/5    Left Shoulder ABduction 4+/5    Left Shoulder External Rotation 4+/5    Right Hip Flexion 4+/5    Right Hip Extension 4/5    Right Hip ABduction 4/5    Left Hip Flexion 4+/5    Left Hip Extension 4/5    Left Hip ABduction 4/5    Right Knee Flexion 4+/5    Right Knee Extension 5/5    Left Knee Flexion 4+/5    Left Knee Extension 5/5      Ambulation/Gait   Ambulation/Gait Yes    Ambulation/Gait Assistance 7: Independent    Assistive device None                           [facility] Adult PT Treatment/Exercise - 02/23/21 0001       Exercises   Exercises Neck;Lumbar      Neck Exercises: Theraband   Rows 15 reps;Green    Horizontal ABduction 10 reps;Green    Horizontal ABduction Limitations seated      Neck Exercises: Seated   Neck Retraction 5 reps;3 secs      Neck Exercises: Stretches   Upper Trapezius  Stretch 20 seconds    Levator Stretch 20 seconds      Lumbar Exercises: Stretches  Passive Hamstring Stretch 2 reps;20 seconds    Passive Hamstring Stretch Limitations seated egde of mat    Lower Trunk Rotation 5 reps;10 seconds    Piriformis Stretch 2 reps;20 seconds    Piriformis Stretch Limitations hooklying figure-4      Lumbar Exercises: Supine   Bridge 5 reps;3 seconds    Straight Leg Raise 5 reps                    PT Education - 02/23/21 [phone]     Education Details Progress toward goals and POC discharge, HEP finalization    Person(s) Educated Patient    Methods Explanation    Comprehension Verbalized understanding              PT Short Term Goals - 02/23/21 0939       PT SHORT TERM GOAL #1   Title Patient will be I with initial HEP to progress with PT    Baseline patient is independent with initial HEP    Time 4    Period Weeks    Status Achieved    Target Date 01/25/21      PT SHORT TERM GOAL #2   Title Patient will report </= 6/10 pain to improve mobility and performance of household tasks    Baseline patient continues to report 7/10 pain    Time 4    Period Weeks    Status Partially Met    Target Date 01/25/21      PT SHORT TERM GOAL #3   Title Patient will demonstrate 5 deg improvement in all planes of cervical motion to indicate reduced muscular tension and pain    Baseline Cervical flexion 20 -> 30 deg, extension 15 -> 35 deg, right rotation 45 -> 50 deg, left rotation 60 -> 70 deg, right sidebend 10 -> 25 deg, left sidebend 20 -> 40 deg    Time 4    Period Weeks    Status Achieved    Target Date 01/25/21               PT Long Term Goals - 02/23/21 0940       PT LONG TERM GOAL #1   Title Patient will be I with final HEP to maintain progress from PT    Baseline patient is independent with current HEP    Time 8    Period Weeks    Status Achieved      PT LONG TERM GOAL #2   Title Patient will report </= 4/10 pain level  with household activity and ambulation to reduce functional limitation    Baseline patient continues to report 7/10 pain levels    Time 8    Period Weeks    Status Partially Met      PT LONG TERM GOAL #3   Title Patient will be able to walk >/= 30 minutes without requiring rest due to pain in order to improve community access    Baseline patient reports he is walking 2.5 miles which is around 3 hours and takes a couple breaks    Time 8    Period Weeks    Status Achieved      PT LONG TERM GOAL #4   Title Patient will be able to sit >/= 45 minutes without having to change positions to improve leisure    Baseline patient reports he can sit about 30 minutes before have to change positions    Time 8  Period Weeks    Status Partially Met      PT LONG TERM GOAL #5   Title Patient will demonstrate improved strength >/= 4+/5 MMT throughout BLE and BUE in order to improve performance of household tasks    Baseline patient exhibit strenth grossly 4+/5 MMT throughout BLE and BUE    Time 8    Period Weeks    Status Achieved                   Plan - 02/23/21 0943     Clinical Impression Statement Patient reports continued improvement in his symptoms and he demonstrates progress toward his goals, achieving his strength and walking goal. He does continue to report high levels of neck and back pain, but states these are about back to his previous baseline before the accident, and limitations with sitting tolerance. Patient demonstrates independence with his HEP and was encouraged to remain consistent in order to progress his strength and mobility in order to continue improve overall symptoms and function ability. Patient will be formally discharged from PT as patient is currently please with his status and is able to continue exercise to improve symptoms.    PT Treatment/Interventions ADLs/Self Care Home Management;Aquatic Therapy;Cryotherapy;Electrical Stimulation;Iontophoresis 83m/ml  Dexamethasone;Moist Heat;Traction;Ultrasound;Neuromuscular re-education;Balance training;Therapeutic exercise;Therapeutic activities;Functional mobility training;Stair training;Gait training;Patient/family education;Manual techniques;Dry needling;Passive range of motion;Taping;Vasopneumatic Device;Spinal Manipulations;Joint Manipulations    PT Next Visit Plan NA - discharge    PT Home Exercise Plan [place] Agree with Plan of Care Patient             Patient will benefit from skilled therapeutic intervention in order to improve the following deficits and impairments:  Abnormal gait, Decreased range of motion, Difficulty walking, Decreased activity tolerance, Increased muscle spasms, Pain, Decreased balance, Impaired flexibility, Postural dysfunction, Improper body mechanics, Decreased strength, Impaired sensation  Visit Diagnosis: Chronic bilateral low back pain, unspecified whether sciatica present  Cervicalgia  Muscle weakness (generalized)     Problem List Patient Active Problem List   Diagnosis Date Noted   Type 2 diabetes mellitus with hyperglycemia, without long-term current use of insulin ([place] 02/10/2021   Neck pain on left side 03/06/2019   Musculoskeletal chest pain 07/24/2018   Asthma, mild intermittent 05/02/2018   Allergic rhinitis caused by mold 05/02/2018   Tobacco use 05/02/2018   Bilateral carpal tunnel syndrome 01/10/2018   Stroke ([place]    Schizophrenia ([place]    Migraine    History of nuclear stress test    History of alcohol abuse    GERD (gastroesophageal reflux disease)    Dysrhythmia    Chronic lower back pain    Anxiety    Arthritis of knee, right 04/12/2017   Bipolar disorder ([place] 04/03/2017   Lipoma of forehead 09/22/2016   Trigger ring finger of right hand 06/22/2016   Paroxysmal atrial fibrillation ([facility])    [place]) ([place] 12/10/2015   Eczema 07/10/2015   History of CVA (cerebrovascular accident) 05/04/2015    Tear of medial meniscus of right knee 03/18/2015   Chronic pain of right knee 03/12/2015   Other and unspecified hyperlipidemia 11/25/2013   Nasal congestion 11/25/2013   Sinusitis, chronic 05/09/2013   Chronic low back pain 10/12/2012   Lumbar radiculopathy 10/12/2012   Hypertension 10/12/2012   Depression 10/12/2012   Insomnia 10/12/2012    CHilda Blades PT, DPT, LAT, ATC 02/23/21  10:03 AM Phone: [phone]: [facility] [address]  [place], [place], [address] Phone: [phone]   Fax:  [phone]  Name: DAMIR LEUNG MRN: [id number] Date of Birth: April 29, 1961   PHYSICAL THERAPY DISCHARGE SUMMARY  Visits from Start of Care: 8  Current functional level related to goals / functional outcomes: See above   Remaining deficits: See above   Education / Equipment: HEP   Patient agrees to discharge. Patient goals were partially met. Patient is being discharged due to being pleased with the current functional level.

## 2021-02-24 ENCOUNTER — Ambulatory Visit (HOSPITAL_COMMUNITY)
Admission: RE | Admit: 2021-02-24 | Discharge: 2021-02-24 | Disposition: A | Discharge status: Home / Self Care | Payer: Medicaid Other | Source: Ambulatory Visit | Attending: Vascular Surgery | Admitting: Vascular Surgery

## 2021-02-24 ENCOUNTER — Ambulatory Visit (INDEPENDENT_AMBULATORY_CARE_PROVIDER_SITE_OTHER): Payer: Medicaid Other | Admitting: Physician Assistant

## 2021-02-24 VITALS — BP 131/72 | HR 90 | Temp 97.7°F | Resp 20 | Ht 73.0 in | Wt 261.0 lb

## 2021-02-24 DIAGNOSIS — I779 Disorder of arteries and arterioles, unspecified: Secondary | ICD-10-CM

## 2021-02-24 DIAGNOSIS — I723 Aneurysm of iliac artery: Secondary | ICD-10-CM | POA: Diagnosis not present

## 2021-02-24 DIAGNOSIS — I480 Paroxysmal atrial fibrillation: Secondary | ICD-10-CM | POA: Diagnosis not present

## 2021-02-24 DIAGNOSIS — I872 Venous insufficiency (chronic) (peripheral): Secondary | ICD-10-CM

## 2021-02-24 DIAGNOSIS — M7989 Other specified soft tissue disorders: Secondary | ICD-10-CM

## 2021-02-24 DIAGNOSIS — Z9889 Other specified postprocedural states: Secondary | ICD-10-CM | POA: Insufficient documentation

## 2021-02-24 DIAGNOSIS — N521 Erectile dysfunction due to diseases classified elsewhere: Secondary | ICD-10-CM

## 2021-02-24 NOTE — Progress Notes (Addendum)
Office Note     CC:  follow up Requesting Provider:  Charlott Rakes, MD  HPI: Jason Stokes is a 60 y.o. (Jul 01, 1961) male who presents for evaluation of bilateral lower extremity swelling, left greater than right. He says this started over past 2 months. Just appeared all the sudden. He says he was in [facility] at end of May and thought maybe it was due to that. He has not changed medications or his activity level. He has been elevating his legs in recliner which helps some. He says at times his legs are achy and tingling when the swelling is a lot. The swelling seems to worsen as the day goes on. He said he thought about compression stockings but wanted to be evaluated before using them. His PCP started him on lasix but he feels this has not helped. 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 He has  no history of DVT. No family history of DVT or venous disease.  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 Stanford Breed 08/19/20. This was incidentally found during work up for sciatic pain. He was asymptomatic regarding the aneurysm. Today he also complains of erectile dysfunction since his procedure. He does also occasionally have some discomfort in both of his hips. Dr. Stanford Breed previously told him that this was to be expected. It does not interfere with his ability to ambulate. He walks regularly. He is compliant with his Statin and Eliquis. He is a current smoker.  Past Medical History:  Diagnosis Date   Anxiety    Bilateral carpal tunnel syndrome 01/10/2018   Bipolar disorder (HCC)    Chronic lower back pain    Depression    Dysrhythmia    a-fib    GERD (gastroesophageal reflux disease)    History of alcohol abuse    History of nuclear stress test    Myoview 10/16: EF 50%, diaphragmatic attenuation, no ischemia, low risk   Hypertension    Migraine 2012-2014   Moderate persistent asthma with acute exacerbation 05/02/2018   PAF (paroxysmal atrial fibrillation) ([place]) 03/27/2015   a. Myoview neg for ischemia >> Flecainide started 10/16 >> FU ETT    Schizophrenia ([place])    Sciatica neuralgia    Small vessel disease ([place])    Right basal ganglia stroke   Stroke [facility]) "between 2012-2014"   residual "AF" (09/22/2015)    Past Surgical History:  Procedure Laterality Date   ATRIAL FIBRILLATION ABLATION  09/22/2015   CYST EXCISION  1996-97   surgery back of head    ELECTROPHYSIOLOGIC STUDY N/A 09/22/2015   Procedure: Atrial Fibrillation Ablation;  Surgeon: Will [name], MD;  Location: [place] [facility];  Service: Cardiovascular;  Laterality: N/A;   ELECTROPHYSIOLOGIC STUDY N/A 12/10/2015   Procedure: Atrial Fibrillation Ablation;  Surgeon: Will [name], MD;  Location: [place] [facility];  Service: Cardiovascular;  Laterality: N/A;   ELECTROPHYSIOLOGIC STUDY N/A 12/11/2015   Procedure: Cardioversion;  Surgeon: Will [name], MD;  Location: [place] [facility];  Service: Cardiovascular;  Laterality: N/A;   EMBOLIZATION Right 08/19/2020   Procedure: EMBOLIZATION;  Surgeon: Cherre Robins, MD;  Location: [place] [facility];  Service: Cardiovascular;  Laterality: Right;  hypogastric   EXCISION MASS HEAD N/A 01/06/2017   Procedure: EXCISION MASS FOREHEAD;  Surgeon: Irene Limbo, MD;  Location: [place];  Service: Plastics;  Laterality: N/A;   GANGLION CYST EXCISION Left    INTERCOSTAL NERVE BLOCK  2005   KNEE ARTHROSCOPY Right 2016   MASS EXCISION N/A 01/25/2021   Procedure: EXCISION SUBCUTANEOUS VS SUBFASCIAL MASS TORSO 3CM;  Surgeon: Irene Limbo, MD;  Location: [place];  Service:  Plastics;  Laterality: N/A;    Social History   Socioeconomic History   Marital status: Single    Spouse name: [name]   Number of children: 3   Years of education: HS   Highest education level: Not on file  Occupational History    Comment: disabled  Tobacco Use   Smoking status: Every Day    Packs/day: 0.25    Years: 29.00    Pack years: 7.25    Types: Cigarettes   Smokeless tobacco: Never  Vaping Use   Vaping Use: Never used  Substance and Sexual Activity   Alcohol use: Yes    Alcohol/week: 0.0 standard drinks    Comment: drinks social   Drug use: Yes    Types: Marijuana    Comment: smoked marijuana 01-02-17 for pain   Sexual activity: Not Currently  Other Topics Concern   Not on file  Social History Narrative   Patient lives at home with [place].    Patient has 3 children 2 step.    Patient has 13 years of schooling.    Patient is right handed.    Social Determinants of Health   Financial Resource Strain: Not on file  Food Insecurity: Not on file  Transportation Needs: Not on file  Physical Activity: Not on file  Stress: Not on file  Social Connections: Not on file  Intimate Partner Violence: Not on file    Family History  Problem Relation Age of Onset   Heart disease Father    Schizophrenia Sister     Current Outpatient Medications  Medication Sig Dispense Refill   Accu-Chek Softclix Lancets lancets Use as instructed 3 times daily before meals 100 each 12   albuterol (PROAIR HFA) 108 (90 Base) MCG/ACT inhaler Inhale 2 puffs into the lungs every 4 (four) hours as needed for wheezing or shortness of breath. 18 g 2   ALPHAGAN P 0.1 % SOLN Place 1 drop into the left eye 2 (two) times daily.     apixaban (ELIQUIS) 5 MG TABS tablet Take 1 tablet (5 mg total) by mouth 2 (two) times daily. 60 tablet 6   atorvastatin (LIPITOR) 40 MG tablet Take 1 tablet (40 mg total) by mouth daily. 30 tablet 6   Azelastine HCl 0.15 % SOLN Place 1 spray into both nostrils 2  (two) times daily. 30 mL 5   baclofen (LIORESAL) 10 MG tablet Take 10 mg by mouth 3 (three) times daily as needed for muscle pain.     Blood Glucose Monitoring Suppl (ACCU-CHEK AVIVA PLUS) w/Device KIT Use 3 times daily before meals 1 kit 0   carvedilol (COREG) 25 MG tablet Take 1 tablet (25 mg total) by mouth 2 (two) times daily. 180 tablet 1   cetirizine (ZYRTEC) 10 MG tablet Take 1 tablet (10 mg total) by mouth daily. 60 tablet 5   CYMBALTA 30 MG capsule Take 30 mg by mouth at bedtime.     diltiazem (CARDIZEM CD) 180 MG 24 hr capsule Take 1 capsule (180 mg  total) by mouth daily. 90 capsule 1   dronedarone (MULTAQ) 400 MG tablet Take 1 tablet (400 mg total) by mouth 2 (two) times daily with a meal. 180 tablet 3   empagliflozin (JARDIANCE) 10 MG TABS tablet Take 1 tablet (10 mg total) by mouth daily. 30 tablet 3   fluticasone (FLONASE) 50 MCG/ACT nasal spray Place 1 spray into both nostrils 2 (two) times daily. 16 g 5   fluticasone furoate-vilanterol (BREO ELLIPTA) 200-25 MCG/INH AEPB Inhale 1 puff into the lungs daily. 60 each 2   folic acid (FOLVITE) 1 MG tablet Take 1 mg by mouth daily.     furosemide (LASIX) 20 MG tablet Take 1 tablet daily, as needed, for swelling 30 tablet 1   GARLIC PO Take 1 tablet by mouth daily as needed (To build immune system up).     glucose blood (ACCU-CHEK AVIVA PLUS) test strip Use as instructed 3 times daily before meals 100 each 12   hydrALAZINE (APRESOLINE) 100 MG tablet TAKE 1 TABLET (100 MG TOTAL) BY MOUTH 3 (THREE) TIMES DAILY. 270 tablet 2   methocarbamol (ROBAXIN) 500 MG tablet Take 1 tablet (500 mg total) by mouth every 8 (eight) hours as needed for muscle spasms. 30 tablet 1   methotrexate (RHEUMATREX) 2.5 MG tablet Take 2.5 mg by mouth daily.     Misc. Devices MISC Back brace. Diagnosis chronic back pain 1 each 0   Misc. Devices MISC Left knee brace. Diagnosis L knee pain 1 each 0   montelukast (SINGULAIR) 10 MG tablet Take 1 tablet (10 mg total) by  mouth at bedtime. 30 tablet 5   naloxone (NARCAN) nasal spray 4 mg/0.1 mL Place 1 spray into the nose daily as needed for opioid reversal.     Olopatadine HCl (PATADAY) 0.2 % SOLN Use one drop in each eye once daily as needed. (Patient taking differently: Place 1 drop into both eyes daily as needed (Dry eye).) 2.5 mL 5   omeprazole (PRILOSEC) 20 MG capsule TAKE 1 CAPSULE BY MOUTH EVERY DAY 90 capsule 2   Oxycodone HCl 10 MG TABS Take 1 tablet (10 mg total) by mouth 4 (four) times daily as needed (Pain). 15 tablet 0   prazosin (MINIPRESS) 1 MG capsule Take 1 mg by mouth at bedtime.  2   prednisoLONE acetate (PRED FORTE) 1 % ophthalmic suspension Place 1 drop into the left eye 6 (six) times daily.     predniSONE (DELTASONE) 20 MG tablet Take 20 mg by mouth 3 (three) times daily.     pregabalin (LYRICA) 75 MG capsule Take 1 capsule by mouth 4 (four) times daily as needed (pain).     SIMBRINZA 1-0.2 % SUSP Place 1 drop into both eyes daily as needed for allergies.     Vitamin D3 (VITAMIN D) 25 MCG tablet Take 1,000 Units by mouth daily.     No current facility-administered medications for this visit.    Allergies  Allergen Reactions   Lisinopril Anaphylaxis    Swelling of lips and tongue.     REVIEW OF SYSTEMS:  [X]  denotes positive finding, [ ]  denotes negative finding Cardiac  Comments:  Chest pain or chest pressure:    Shortness of breath upon exertion:    Short of breath when lying flat:    Irregular heart rhythm:        Vascular    Pain in calf, thigh, or hip brought on by ambulation:    Pain in feet at night that  wakes you up from your sleep:     Blood clot in your veins:    Leg swelling:  X       Pulmonary    Oxygen at home:    Productive cough:     Wheezing:         Neurologic    Sudden weakness in arms or legs:     Sudden numbness in arms or legs:     Sudden onset of difficulty speaking or slurred speech:    Temporary loss of vision in one eye:     Problems with  dizziness:         Gastrointestinal    Blood in stool:     Vomited blood:         Genitourinary    Burning when urinating:     Blood in urine:        Psychiatric    Major depression:         Hematologic    Bleeding problems:    Problems with blood clotting too easily:        Skin    Rashes or ulcers:        Constitutional    Fever or chills:      PHYSICAL EXAMINATION:  Vitals:   02/24/21 0941  BP: 131/72  Pulse: 90  Resp: 20  Temp: 97.7 F (36.5 C)  SpO2: 99%  Weight: 261 lb (118.4 kg)  Height: 6' 1"  (1.854 m)    General:  WDWN in NAD; vital signs documented above Gait: Normal HENT: WNL, normocephalic Pulmonary: normal non-labored breathing ,without wheezing Cardiac: regular HR, without  Murmurs without carotid bruit Abdomen: soft, NT, no masses Vascular Exam/Pulses:  Right Left  Radial 2+ (normal) 2+ (normal)  Femoral 2+ (normal) 2+ (normal)  Popliteal Not palpable  Not palpable  DP 2+ (normal) 1+ (weak)  PT 2+ (normal) 2+ (normal)   Extremities: without varicose veins, without reticular veins, with edema of bilateral lower extremities, left slightly more than right, without stasis pigmentation, without lipodermatosclerosis, without ulcers Musculoskeletal: no muscle wasting or atrophy  Neurologic: A&O X 3;  No focal weakness or paresthesias are detected Psychiatric:  The pt has Normal affect.   Non-Invasive Vascular Imaging:    BLE Venous Insufficiency Duplex (02/24/21):  RLE:  No DVT and SVT GSV reflux SFJ, distal thigh, and at knee GSV diameter 0.36-0.42 SSV reflux  proximal and mid calf No deep venous reflux  LLE: No DVT and SVT GSV reflux SFJ and proximal thigh  GSV diameter 0.31-0.45 No SSV reflux  No deep venous reflux Residual chronic thrombus in GSV at knee    ASSESSMENT/PLAN:: 60 y.o. male here for evaluation of bilateral lower extremity swelling. He has BLE chronic venous insufficiency with swelling. His duplex today shows no  DVT. 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 He has no deep venous revflux but does have bilateral superficial venous reflux. Based on the patient's history and examination, I recommend 3 months trial of conservative therapy with elevation, thigh high compression stockings, exercise, refraining from prolonged sitting or standing. Based on his duplex he is potential candidate for lazer ablation bilaterally. - I discussed with the patient the use of her 20-30 mm thigh high compression stockings and need for 3 month trial of such - He does have history of right iliac artery aneurysm coiling. Does not have disabling symptoms following and has palpable pulses in both feet. He is however  experiencing Erectile dysfunction. I discussed with him possible medical management for this but also may require intervention. I will have him follow up in 1 month with [facility] to see Dr. Stanford Breed to follow up regarding his right internal iliac artery coiling - I will additionally give him an appointment to follow up in 3 months with Dr. Scot Dock to discuss venous lazer ablation   Karoline Caldwell, PA-C [facility] [phone]   Clinic MD:   Oneida Alar

## 2021-02-25 DIAGNOSIS — I480 Paroxysmal atrial fibrillation: Secondary | ICD-10-CM | POA: Diagnosis not present

## 2021-02-26 ENCOUNTER — Other Ambulatory Visit: Payer: Self-pay

## 2021-02-26 DIAGNOSIS — Z9889 Other specified postprocedural states: Secondary | ICD-10-CM

## 2021-02-26 DIAGNOSIS — I480 Paroxysmal atrial fibrillation: Secondary | ICD-10-CM | POA: Diagnosis not present

## 2021-02-27 DIAGNOSIS — I480 Paroxysmal atrial fibrillation: Secondary | ICD-10-CM | POA: Diagnosis not present

## 2021-02-28 DIAGNOSIS — I480 Paroxysmal atrial fibrillation: Secondary | ICD-10-CM | POA: Diagnosis not present

## 2021-03-01 DIAGNOSIS — I480 Paroxysmal atrial fibrillation: Secondary | ICD-10-CM | POA: Diagnosis not present

## 2021-03-02 DIAGNOSIS — I480 Paroxysmal atrial fibrillation: Secondary | ICD-10-CM | POA: Diagnosis not present

## 2021-03-03 DIAGNOSIS — H209 Unspecified iridocyclitis: Secondary | ICD-10-CM | POA: Diagnosis not present

## 2021-03-03 DIAGNOSIS — I480 Paroxysmal atrial fibrillation: Secondary | ICD-10-CM | POA: Diagnosis not present

## 2021-03-03 DIAGNOSIS — H0510 Unspecified chronic inflammatory disorders of orbit: Secondary | ICD-10-CM | POA: Diagnosis not present

## 2021-03-04 DIAGNOSIS — I480 Paroxysmal atrial fibrillation: Secondary | ICD-10-CM | POA: Diagnosis not present

## 2021-03-05 ENCOUNTER — Other Ambulatory Visit: Payer: Self-pay

## 2021-03-05 ENCOUNTER — Ambulatory Visit (INDEPENDENT_AMBULATORY_CARE_PROVIDER_SITE_OTHER): Payer: Medicaid Other

## 2021-03-05 DIAGNOSIS — J309 Allergic rhinitis, unspecified: Secondary | ICD-10-CM | POA: Diagnosis not present

## 2021-03-05 DIAGNOSIS — I480 Paroxysmal atrial fibrillation: Secondary | ICD-10-CM | POA: Diagnosis not present

## 2021-03-06 DIAGNOSIS — I480 Paroxysmal atrial fibrillation: Secondary | ICD-10-CM | POA: Diagnosis not present

## 2021-03-07 DIAGNOSIS — I480 Paroxysmal atrial fibrillation: Secondary | ICD-10-CM | POA: Diagnosis not present

## 2021-03-08 DIAGNOSIS — I480 Paroxysmal atrial fibrillation: Secondary | ICD-10-CM | POA: Diagnosis not present

## 2021-03-09 DIAGNOSIS — I480 Paroxysmal atrial fibrillation: Secondary | ICD-10-CM | POA: Diagnosis not present

## 2021-03-10 ENCOUNTER — Other Ambulatory Visit: Payer: Self-pay | Admitting: Physician Assistant

## 2021-03-10 DIAGNOSIS — I480 Paroxysmal atrial fibrillation: Secondary | ICD-10-CM

## 2021-03-10 NOTE — Telephone Encounter (Signed)
Pt last saw Dr Curt Bears on 02/09/21, last labs 02/09/21 Creat 1.14, age 60, weight 118.4kg, based on specified criteria pt is on appropriate dosage of Eliquis '5mg'$  BID for afib.  Will refill rx.

## 2021-03-11 DIAGNOSIS — I480 Paroxysmal atrial fibrillation: Secondary | ICD-10-CM | POA: Diagnosis not present

## 2021-03-12 ENCOUNTER — Ambulatory Visit (INDEPENDENT_AMBULATORY_CARE_PROVIDER_SITE_OTHER): Payer: Medicaid Other | Admitting: *Deleted

## 2021-03-12 DIAGNOSIS — I480 Paroxysmal atrial fibrillation: Secondary | ICD-10-CM | POA: Diagnosis not present

## 2021-03-12 DIAGNOSIS — J309 Allergic rhinitis, unspecified: Secondary | ICD-10-CM

## 2021-03-13 DIAGNOSIS — I480 Paroxysmal atrial fibrillation: Secondary | ICD-10-CM | POA: Diagnosis not present

## 2021-03-14 DIAGNOSIS — I480 Paroxysmal atrial fibrillation: Secondary | ICD-10-CM | POA: Diagnosis not present

## 2021-03-15 DIAGNOSIS — I480 Paroxysmal atrial fibrillation: Secondary | ICD-10-CM | POA: Diagnosis not present

## 2021-03-16 DIAGNOSIS — I480 Paroxysmal atrial fibrillation: Secondary | ICD-10-CM | POA: Diagnosis not present

## 2021-03-17 DIAGNOSIS — I480 Paroxysmal atrial fibrillation: Secondary | ICD-10-CM | POA: Diagnosis not present

## 2021-03-18 ENCOUNTER — Other Ambulatory Visit: Payer: Self-pay | Admitting: Family Medicine

## 2021-03-18 DIAGNOSIS — I480 Paroxysmal atrial fibrillation: Secondary | ICD-10-CM | POA: Diagnosis not present

## 2021-03-18 MED ORDER — METFORMIN HCL 500 MG PO TABS: 500.0000 mg | ORAL_TABLET | Freq: Two times a day (BID) | ORAL | 3 refills | Status: DC

## 2021-03-18 NOTE — Progress Notes (Signed)
SGLT2I not approved by insurance until he has tried Metformin. I have ordered Metformin.

## 2021-03-19 ENCOUNTER — Ambulatory Visit (INDEPENDENT_AMBULATORY_CARE_PROVIDER_SITE_OTHER): Payer: Medicaid Other

## 2021-03-19 DIAGNOSIS — F319 Bipolar disorder, unspecified: Secondary | ICD-10-CM | POA: Diagnosis not present

## 2021-03-19 DIAGNOSIS — I10 Essential (primary) hypertension: Secondary | ICD-10-CM | POA: Diagnosis not present

## 2021-03-19 DIAGNOSIS — J309 Allergic rhinitis, unspecified: Secondary | ICD-10-CM | POA: Diagnosis not present

## 2021-03-19 DIAGNOSIS — I480 Paroxysmal atrial fibrillation: Secondary | ICD-10-CM | POA: Diagnosis not present

## 2021-03-19 DIAGNOSIS — E1129 Type 2 diabetes mellitus with other diabetic kidney complication: Secondary | ICD-10-CM | POA: Diagnosis not present

## 2021-03-19 DIAGNOSIS — R7989 Other specified abnormal findings of blood chemistry: Secondary | ICD-10-CM | POA: Diagnosis not present

## 2021-03-20 DIAGNOSIS — I480 Paroxysmal atrial fibrillation: Secondary | ICD-10-CM | POA: Diagnosis not present

## 2021-03-21 DIAGNOSIS — I480 Paroxysmal atrial fibrillation: Secondary | ICD-10-CM | POA: Diagnosis not present

## 2021-03-22 DIAGNOSIS — I480 Paroxysmal atrial fibrillation: Secondary | ICD-10-CM | POA: Diagnosis not present

## 2021-03-23 ENCOUNTER — Encounter (HOSPITAL_COMMUNITY): Payer: Self-pay | Admitting: Physician Assistant

## 2021-03-23 ENCOUNTER — Other Ambulatory Visit: Payer: Self-pay

## 2021-03-23 ENCOUNTER — Ambulatory Visit (HOSPITAL_COMMUNITY)
Admission: RE | Admit: 2021-03-23 | Discharge: 2021-03-23 | Disposition: A | Discharge status: Home / Self Care | Payer: Medicaid Other | Source: Ambulatory Visit | Attending: Physician Assistant | Admitting: Physician Assistant

## 2021-03-23 VITALS — BP 120/76 | HR 97 | Ht 73.0 in | Wt 261.8 lb

## 2021-03-23 DIAGNOSIS — Z8249 Family history of ischemic heart disease and other diseases of the circulatory system: Secondary | ICD-10-CM | POA: Insufficient documentation

## 2021-03-23 DIAGNOSIS — Z09 Encounter for follow-up examination after completed treatment for conditions other than malignant neoplasm: Secondary | ICD-10-CM | POA: Diagnosis not present

## 2021-03-23 DIAGNOSIS — Z7901 Long term (current) use of anticoagulants: Secondary | ICD-10-CM | POA: Diagnosis not present

## 2021-03-23 DIAGNOSIS — Z79899 Other long term (current) drug therapy: Secondary | ICD-10-CM | POA: Insufficient documentation

## 2021-03-23 DIAGNOSIS — Z8673 Personal history of transient ischemic attack (TIA), and cerebral infarction without residual deficits: Secondary | ICD-10-CM | POA: Diagnosis not present

## 2021-03-23 DIAGNOSIS — Z7984 Long term (current) use of oral hypoglycemic drugs: Secondary | ICD-10-CM | POA: Diagnosis not present

## 2021-03-23 DIAGNOSIS — D6869 Other thrombophilia: Secondary | ICD-10-CM | POA: Insufficient documentation

## 2021-03-23 DIAGNOSIS — I10 Essential (primary) hypertension: Secondary | ICD-10-CM | POA: Insufficient documentation

## 2021-03-23 DIAGNOSIS — E669 Obesity, unspecified: Secondary | ICD-10-CM | POA: Diagnosis not present

## 2021-03-23 DIAGNOSIS — E118 Type 2 diabetes mellitus with unspecified complications: Secondary | ICD-10-CM | POA: Diagnosis not present

## 2021-03-23 DIAGNOSIS — I480 Paroxysmal atrial fibrillation: Secondary | ICD-10-CM | POA: Insufficient documentation

## 2021-03-23 DIAGNOSIS — Z6834 Body mass index (BMI) 34.0-34.9, adult: Secondary | ICD-10-CM | POA: Diagnosis not present

## 2021-03-23 DIAGNOSIS — F17210 Nicotine dependence, cigarettes, uncomplicated: Secondary | ICD-10-CM | POA: Diagnosis not present

## 2021-03-23 MED ORDER — DRONEDARONE HCL 400 MG PO TABS
400.0000 mg | ORAL_TABLET | Freq: Two times a day (BID) | ORAL | 3 refills | Status: DC
Start: 2021-03-23 — End: 2021-12-24

## 2021-03-23 NOTE — Progress Notes (Signed)
Primary Care Physician: Charlott Rakes, MD Primary Electrophysiologist: Dr Curt Bears Referring Physician: Tommye Standard PA   Jason Stokes is a 60 y.o. male with a history of HTN, prior CVA, DM, atrial fibrillation who presents for follow up in the [facility]. Patient is on Eliquis for a CHADS2VASC score of 4. He is s/p ablation in 2017 and is currently on Multaq. He has done well with no recent episodes of afib. He denies any bleeding issues on anticoagulation.   Today, he denies symptoms of palpitations, chest pain, shortness of breath, orthopnea, PND, lower extremity edema, dizziness, presyncope, syncope, snoring, daytime somnolence, bleeding, or neurologic sequela. The patient is tolerating medications without difficulties and is otherwise without complaint today.    Atrial Fibrillation Risk Factors:  he does not have symptoms or diagnosis of sleep apnea. he does not have a history of rheumatic fever.   he has a BMI of Body mass index is 34.54 kg/m.Marland Kitchen Filed Weights   03/23/21 1017  Weight: 118.8 kg    Family History  Problem Relation Age of Onset   Heart disease Father    Schizophrenia Sister      Atrial Fibrillation Management history:  Previous antiarrhythmic drugs: Flecainide, amiodarone, Multaq Previous cardioversions: none Previous ablations: 2017 CHADS2VASC score: 4 Anticoagulation history: Eliquis   Past Medical History:  Diagnosis Date   Anxiety    Bilateral carpal tunnel syndrome 01/10/2018   Bipolar disorder ([place])    Chronic lower back pain    Depression    Dysrhythmia    a-fib   GERD (gastroesophageal reflux disease)    History of alcohol abuse    History of nuclear stress test    Myoview 10/16: EF 50%, diaphragmatic attenuation, no ischemia, low risk   Hypertension    Migraine 2012-2014   Moderate persistent asthma with acute exacerbation 05/02/2018   PAF (paroxysmal atrial fibrillation) ([place]) 03/27/2015   a.  Myoview neg for ischemia >> Flecainide started 10/16 >> FU ETT    Schizophrenia ([place])    Sciatica neuralgia    Small vessel disease ([place])    Right basal ganglia stroke   Stroke [facility]) "between 2012-2014"   residual "AF" (09/22/2015)   Past Surgical History:  Procedure Laterality Date   ATRIAL FIBRILLATION ABLATION  09/22/2015   CYST EXCISION  1996-97   surgery back of head    ELECTROPHYSIOLOGIC STUDY N/A 09/22/2015   Procedure: Atrial Fibrillation Ablation;  Surgeon: Will [name], MD;  Location: [place] [facility];  Service: Cardiovascular;  Laterality: N/A;   ELECTROPHYSIOLOGIC STUDY N/A 12/10/2015   Procedure: Atrial Fibrillation Ablation;  Surgeon: Will [name], MD;  Location: [place] [facility];  Service: Cardiovascular;  Laterality: N/A;   ELECTROPHYSIOLOGIC STUDY N/A 12/11/2015   Procedure: Cardioversion;  Surgeon: Will [name], MD;  Location: [place] [facility];  Service: Cardiovascular;  Laterality: N/A;   EMBOLIZATION Right 08/19/2020   Procedure: EMBOLIZATION;  Surgeon: Cherre Robins, MD;  Location: [place] [facility];  Service: Cardiovascular;  Laterality: Right;  hypogastric   EXCISION MASS HEAD N/A 01/06/2017   Procedure: EXCISION MASS FOREHEAD;  Surgeon: Irene Limbo, MD;  Location: [place];  Service: Plastics;  Laterality: N/A;   GANGLION CYST EXCISION Left    INTERCOSTAL NERVE BLOCK  2005   KNEE ARTHROSCOPY Right 2016   MASS EXCISION N/A 01/25/2021   Procedure: EXCISION SUBCUTANEOUS VS SUBFASCIAL MASS TORSO 3CM;  Surgeon: Irene Limbo, MD;  Location: [facility];  Service: Plastics;  Laterality: N/A;    Current Outpatient Medications  Medication Sig Dispense Refill   Accu-Chek Softclix Lancets lancets Use as instructed 3 times daily before meals 100 each 12   albuterol (PROAIR HFA) 108 (90 Base) MCG/ACT inhaler Inhale 2 puffs into the lungs every 4 (four) hours as needed for wheezing or shortness of breath. 18 g 2    ALPHAGAN P 0.1 % SOLN Place 1 drop into the left eye 2 (two) times daily.     atorvastatin (LIPITOR) 40 MG tablet Take 1 tablet (40 mg total) by mouth daily. 30 tablet 6   Azelastine HCl 0.15 % SOLN Place 1 spray into both nostrils 2 (two) times daily. 30 mL 5   baclofen (LIORESAL) 10 MG tablet Take 10 mg by mouth 3 (three) times daily as needed for muscle pain.     Blood Glucose Monitoring Suppl (ACCU-CHEK AVIVA PLUS) w/Device KIT Use 3 times daily before meals 1 kit 0   carvedilol (COREG) 25 MG tablet Take 1 tablet (25 mg total) by mouth 2 (two) times daily. 180 tablet 1   cetirizine (ZYRTEC) 10 MG tablet Take 1 tablet (10 mg total) by mouth daily. 60 tablet 5   CYMBALTA 30 MG capsule Take 30 mg by mouth at bedtime.     diltiazem (CARDIZEM CD) 180 MG 24 hr capsule Take 1 capsule (180 mg total) by mouth daily. 90 capsule 1   dronedarone (MULTAQ) 400 MG tablet Take 1 tablet (400 mg total) by mouth 2 (two) times daily with a meal. 180 tablet 3   ELIQUIS 5 MG TABS tablet TAKE 1 TABLET BY MOUTH TWICE A DAY 60 tablet 6   empagliflozin (JARDIANCE) 10 MG TABS tablet Take 1 tablet (10 mg total) by mouth daily. 30 tablet 3   fluticasone (FLONASE) 50 MCG/ACT nasal spray Place 1 spray into both nostrils 2 (two) times daily. 16 g 5   fluticasone furoate-vilanterol (BREO ELLIPTA) 200-25 MCG/INH AEPB Inhale 1 puff into the lungs daily. 60 each 2   folic acid (FOLVITE) 1 MG tablet Take 1 mg by mouth daily.     furosemide (LASIX) 20 MG tablet Take 1 tablet daily, as needed, for swelling 30 tablet 1   GARLIC PO Take 1 tablet by mouth daily as needed (To build immune system up).     glucose blood (ACCU-CHEK AVIVA PLUS) test strip Use as instructed 3 times daily before meals 100 each 12   hydrALAZINE (APRESOLINE) 100 MG tablet TAKE 1 TABLET (100 MG TOTAL) BY MOUTH 3 (THREE) TIMES DAILY. 270 tablet 2   metFORMIN (GLUCOPHAGE) 500 MG tablet Take 1 tablet (500 mg total) by mouth 2 (two) times daily with a meal. 60  tablet 3   methocarbamol (ROBAXIN) 500 MG tablet Take 1 tablet (500 mg total) by mouth every 8 (eight) hours as needed for muscle spasms. 30 tablet 1   methotrexate (RHEUMATREX) 2.5 MG tablet Take 2.5 mg by mouth daily.     Misc. Devices MISC Back brace. Diagnosis chronic back pain 1 each 0   Misc. Devices MISC Left knee brace. Diagnosis L knee pain 1 each 0   montelukast (SINGULAIR) 10 MG tablet Take 1 tablet (10 mg total) by mouth at bedtime. 30 tablet 5   naloxone (NARCAN) nasal spray 4 mg/0.1 mL Place 1 spray into the nose daily as needed for opioid reversal.     Olopatadine HCl (PATADAY) 0.2 % SOLN Use one drop in each eye  once daily as needed. (Patient taking differently: Place 1 drop into both eyes daily as needed (Dry eye).) 2.5 mL 5   omeprazole (PRILOSEC) 20 MG capsule TAKE 1 CAPSULE BY MOUTH EVERY DAY 90 capsule 2   Oxycodone HCl 10 MG TABS Take 1 tablet (10 mg total) by mouth 4 (four) times daily as needed (Pain). 15 tablet 0   prazosin (MINIPRESS) 1 MG capsule Take 1 mg by mouth at bedtime.  2   prednisoLONE acetate (PRED FORTE) 1 % ophthalmic suspension Place 1 drop into the left eye 6 (six) times daily.     predniSONE (DELTASONE) 20 MG tablet Take 20 mg by mouth 3 (three) times daily.     pregabalin (LYRICA) 75 MG capsule Take 1 capsule by mouth 4 (four) times daily as needed (pain).     SIMBRINZA 1-0.2 % SUSP Place 1 drop into both eyes daily as needed for allergies.     Vitamin D3 (VITAMIN D) 25 MCG tablet Take 1,000 Units by mouth daily.     No current facility-administered medications for this encounter.    Allergies  Allergen Reactions   Lisinopril Anaphylaxis    Swelling of lips and tongue.    Social History   Socioeconomic History   Marital status: Single    Spouse name: [name]   Number of children: 3   Years of education: HS   Highest education level: Not on file  Occupational History    Comment: disabled  Tobacco Use   Smoking status: Every Day     Packs/day: 0.25    Years: 29.00    Pack years: 7.25    Types: Cigarettes   Smokeless tobacco: Never   Tobacco comments:    1-3 cigarettes every 2-3 days 03/23/2021  Vaping Use   Vaping Use: Never used  Substance and Sexual Activity   Alcohol use: Yes    Alcohol/week: 2.0 standard drinks    Types: 2 Standard drinks or equivalent per week    Comment: 66mxed drinks   Drug use: Yes    Types: Marijuana    Comment: smoked marijuana 01-02-17 for pain   Sexual activity: Not Currently  Other Topics Concern   Not on file  Social History Narrative   [place]    Patient has 3 children 2 step.    Patient has 13 years of schooling.    Patient is right handed.    Social Determinants of Health   Financial Resource Strain: Not on file  Food Insecurity: Not on file  Transportation Needs: Not on file  Physical Activity: Not on file  Stress: Not on file  Social Connections: Not on file  Intimate Partner Violence: Not on file     ROS- All systems are reviewed and negative except as per the HPI above.  Physical Exam: Vitals:   03/23/21 1017  BP: 120/76  Pulse: 97  Weight: 118.8 kg  Height: _0  (1.854 m)    GEN- The patient is a well appearing obese male, alert and oriented x 3 today.   Head- normocephalic, atraumatic Eyes-  Sclera clear, conjunctiva pink Ears- hearing intact Oropharynx- clear Neck- supple  Lungs- Clear to ausculation bilaterally, normal work of breathing Heart- Regular rate and rhythm, no murmurs, rubs or gallops  GI- soft, NT, ND, + BS Extremities- no clubbing, cyanosis, or edema MS- no significant deformity or atrophy Skin- no rash or lesion Psych- euthymic mood, full affect Neuro- strength and sensation are intact  Wt Readings from Last 3 Encounters:  03/23/21 118.8 kg  02/24/21 118.4 kg  02/10/21 115.7 kg    EKG today demonstrates  SR Vent. rate 97 BPM PR interval 146 ms QRS duration 70 ms QT/QTcB 346/439 ms  Echo  01/13/21 demonstrated   1. Left ventricular ejection fraction, by estimation, is 60 to 65%. The left ventricle has normal function. The left ventricle has no regional wall motion abnormalities. There is moderate left ventricular hypertrophy. Left ventricular diastolic  parameters are consistent with Grade I diastolic dysfunction (impaired relaxation). Elevated left ventricular end-diastolic pressure. The E/e' is 62.   2. Right ventricular systolic function is normal. The right ventricular  size is normal.   3. The mitral valve is grossly normal. Trivial mitral valve  regurgitation.   4. The aortic valve is tricuspid. Aortic valve regurgitation is not  visualized.   5. Aortic dilatation noted. There is borderline dilatation of the aortic  root, measuring 38 mm. There is mild dilatation of the ascending aorta, measuring 39 mm.   6. The inferior vena cava is normal in size with greater than 50%  respiratory variability, suggesting right atrial pressure of 3 mmHg.   Epic records are reviewed at length today  CHA2DS2-VASc Score = 4  The patient's score is based upon: CHF History: No HTN History: Yes Diabetes History: Yes Stroke History: Yes Vascular Disease History: No Age Score: 0 Gender Score: 0       ASSESSMENT AND PLAN: 1. Paroxysmal Atrial Fibrillation (ICD10:  I48.0) The patient's CHA2DS2-VASc score is 4, indicating a 4.8% annual risk of stroke.   S/p afib ablation 2017 Patient appears to be maintaining SR. Continue Multaq 400 mg BID Continue Eliquis 5 mg BID Continue carvedilol 25 mg BID Continue diltiazem 180 mg daily  2. Secondary Hypercoagulable State (ICD10:  D68.69) The patient is at significant risk for stroke/thromboembolism based upon his CHA2DS2-VASc Score of 4.  Continue Apixaban (Eliquis).   3. Obesity Body mass index is 34.54 kg/m. Lifestyle modification was discussed at length including regular exercise and weight reduction.  4. HTN Stable, no changes  today.   Follow up with Dr Curt Bears per recall. AF clinic as needed.    [facility] [address] [place], [place] [address] [phone] 03/23/2021 10:46 AM

## 2021-03-24 DIAGNOSIS — I480 Paroxysmal atrial fibrillation: Secondary | ICD-10-CM | POA: Diagnosis not present

## 2021-03-25 DIAGNOSIS — I480 Paroxysmal atrial fibrillation: Secondary | ICD-10-CM | POA: Diagnosis not present

## 2021-03-26 DIAGNOSIS — I480 Paroxysmal atrial fibrillation: Secondary | ICD-10-CM | POA: Diagnosis not present

## 2021-03-27 DIAGNOSIS — I480 Paroxysmal atrial fibrillation: Secondary | ICD-10-CM | POA: Diagnosis not present

## 2021-03-28 DIAGNOSIS — I480 Paroxysmal atrial fibrillation: Secondary | ICD-10-CM | POA: Diagnosis not present

## 2021-03-28 NOTE — Progress Notes (Signed)
ASSESSMENT & PLAN:  60 y.o. 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  He has neuropathic type discomfort in his feet with normal peripheral pulse exam.  He has also developed erectile dysfunction. I suspect both will improve with time. Of note on his post procedure CT angiogram both pudendal / perineal arteries fill with contrast suggesting appropriate collateralization to the perineum from other pelvic sources, but part of his difficulty may be vasculogenic.  He tried a friend's Viagra, but had heart racing.  I asked him to discuss medical therapy with his primary care physician or cardiologist given his difficulty with Viagra. Follow up with me as needed  CHIEF COMPLAINT:   Right internal iliac artery aneurysm  HISTORY:  HISTORY OF PRESENT ILLNESS: Jason CHICHESTERis a 60y.o. male referred to clinic for evaluation of right internal iliac artery aneurysm.  Discovered during the course of a work-up for right sciatic pain.  CTA was obtained which demonstrated a 28 mm right internal iliac artery aneurysm.  He was referred to my office for evaluation.  He is asymptomatic from this aneurysm.  He is very nervous about the aneurysm and desires repair.  I counseled him for 30 minutes about the natural history and methods of repair.  Understanding and still wished to proceed with repair.  03/30/2021: Patient returns to clinic for evaluation.  He reports swelling in his bilateral lower extremities.  He also has tingling discomfort in his feet which occur predominantly at rest.  The pain is described as "electric."  He has no trouble walking.  He does report some swelling in his lower extremities which is improving with compression therapy.  Past Medical History:  Diagnosis Date   Anxiety    Bilateral carpal tunnel syndrome 01/10/2018   Bipolar disorder ([facility])    Chronic lower back pain    Depression    Dysrhythmia    a-fib   GERD (gastroesophageal  reflux disease)    History of alcohol abuse    History of nuclear stress test    Myoview 10/16: EF 50%, diaphragmatic attenuation, no ischemia, low risk   Hypertension    Migraine 2012-2014   Moderate persistent asthma with acute exacerbation 05/02/2018   [place]   a. Myoview neg for ischemia >> Flecainide started 10/16 >> FU ETT    Schizophrenia ([place]    Sciatica neuralgia    Small vessel disease ([place]    Right basal ganglia stroke   Stroke ([facility] "between 2012-2014"   residual "AF" (09/22/2015)    Past Surgical History:  Procedure Laterality Date   ATRIAL FIBRILLATION ABLATION  09/22/2015   CYST EXCISION  1996-97   surgery back of head    ELECTROPHYSIOLOGIC STUDY N/A 09/22/2015   Procedure: Atrial Fibrillation Ablation;  Surgeon: Will MMeredith Leeds MD;  [facility];  Service: Cardiovascular;  Laterality: N/A;   ELECTROPHYSIOLOGIC STUDY N/A 12/10/2015   Procedure: Atrial Fibrillation Ablation;  Surgeon: Will MMeredith Leeds MD;  [place] [facility];  Service: Cardiovascular;  Laterality: N/A;   ELECTROPHYSIOLOGIC STUDY N/A 12/11/2015   Procedure: Cardioversion;  Surgeon: Will MMeredith Leeds MD;  [facility];  Service: Cardiovascular;  Laterality: N/A;   EMBOLIZATION Right 08/19/2020   Procedure: EMBOLIZATION;  Surgeon: HCherre Robins MD;  [facility];  Service: Cardiovascular;  Laterality: Right;  hypogastric   EXCISION MASS HEAD N/A 01/06/2017  Procedure: EXCISION MASS FOREHEAD;  Surgeon: Irene Limbo, MD;  Location: [place];  Service: Plastics;  Laterality: N/A;   GANGLION CYST EXCISION Left    INTERCOSTAL NERVE BLOCK  2005   KNEE ARTHROSCOPY Right 2016   MASS EXCISION N/A 01/25/2021   Procedure: EXCISION SUBCUTANEOUS VS SUBFASCIAL MASS TORSO 3CM;  Surgeon: Irene Limbo, MD;  Location: [place];  Service: Plastics;  Laterality: N/A;     Family History  Problem Relation Age of Onset   Heart disease Father    Schizophrenia Sister     Social History   Socioeconomic History   Marital status: Single    Spouse name: [name]   Number of children: 3   Years of education: HS   Highest education level: Not on file  Occupational History    Comment: disabled  Tobacco Use   Smoking status: Every Day    Packs/day: 0.25    Years: 29.00    Pack years: 7.25    Types: Cigarettes   Smokeless tobacco: Never   Tobacco comments:    1-3 cigarettes every 2-3 days 03/23/2021  Vaping Use   Vaping Use: Never used  Substance and Sexual Activity   Alcohol use: Yes    Alcohol/week: 2.0 standard drinks    Types: 2 Standard drinks or equivalent per week    Comment: 15mxed drinks   Drug use: Yes    Types: Marijuana    Comment: smoked marijuana 01-02-17 for pain   Sexual activity: Not Currently  Other Topics Concern   Not on file  Social History Narrative   [place]    Patient has 3 children 2 step.    Patient has 13 years of schooling.    Patient is right handed.    Social Determinants of Health   Financial Resource Strain: Not on file  Food Insecurity: Not on file  Transportation Needs: Not on file  Physical Activity: Not on file  Stress: Not on file  Social Connections: Not on file  Intimate Partner Violence: Not on file    Allergies  Allergen Reactions   Lisinopril Anaphylaxis    Swelling of lips and tongue.    Current Outpatient Medications  Medication Sig Dispense Refill   Accu-Chek Softclix Lancets lancets Use as instructed 3 times daily before meals 100 each 12   albuterol (PROAIR HFA) 108 (90 Base) MCG/ACT inhaler Inhale 2 puffs into the lungs every 4 (four) hours as needed for wheezing or shortness of breath. 18 g 2   ALPHAGAN P 0.1 % SOLN Place 1 drop into the left eye 2 (two) times daily.     atorvastatin (LIPITOR) 40 MG tablet Take 1 tablet (40 mg total) by mouth daily. 30  tablet 6   Azelastine HCl 0.15 % SOLN Place 1 spray into both nostrils 2 (two) times daily. 30 mL 5   baclofen (LIORESAL) 10 MG tablet Take 10 mg by mouth 3 (three) times daily as needed for muscle pain.     Blood Glucose Monitoring Suppl (ACCU-CHEK AVIVA PLUS) w/Device KIT Use 3 times daily before meals 1 kit 0   carvedilol (COREG) 25 MG tablet Take 1 tablet (25 mg total) by mouth 2 (two) times daily. 180 tablet 1   cetirizine (ZYRTEC) 10 MG tablet Take 1 tablet (10 mg total) by mouth daily. 60 tablet 5   CYMBALTA 30 MG capsule Take 30 mg by mouth at bedtime.     diltiazem (CARDIZEM CD)  180 MG 24 hr capsule Take 1 capsule (180 mg total) by mouth daily. 90 capsule 1   dronedarone (MULTAQ) 400 MG tablet Take 1 tablet (400 mg total) by mouth 2 (two) times daily with a meal. 180 tablet 3   ELIQUIS 5 MG TABS tablet TAKE 1 TABLET BY MOUTH TWICE A DAY 60 tablet 6   empagliflozin (JARDIANCE) 10 MG TABS tablet Take 1 tablet (10 mg total) by mouth daily. 30 tablet 3   fluticasone (FLONASE) 50 MCG/ACT nasal spray Place 1 spray into both nostrils 2 (two) times daily. 16 g 5   fluticasone furoate-vilanterol (BREO ELLIPTA) 200-25 MCG/INH AEPB Inhale 1 puff into the lungs daily. 60 each 2   folic acid (FOLVITE) 1 MG tablet Take 1 mg by mouth daily.     furosemide (LASIX) 20 MG tablet Take 1 tablet daily, as needed, for swelling 30 tablet 1   GARLIC PO Take 1 tablet by mouth daily as needed (To build immune system up).     glucose blood (ACCU-CHEK AVIVA PLUS) test strip Use as instructed 3 times daily before meals 100 each 12   hydrALAZINE (APRESOLINE) 100 MG tablet TAKE 1 TABLET (100 MG TOTAL) BY MOUTH 3 (THREE) TIMES DAILY. 270 tablet 2   metFORMIN (GLUCOPHAGE) 500 MG tablet Take 1 tablet (500 mg total) by mouth 2 (two) times daily with a meal. 60 tablet 3   methocarbamol (ROBAXIN) 500 MG tablet Take 1 tablet (500 mg total) by mouth every 8 (eight) hours as needed for muscle spasms. 30 tablet 1   methotrexate  (RHEUMATREX) 2.5 MG tablet Take 2.5 mg by mouth daily.     Misc. Devices MISC Back brace. Diagnosis chronic back pain 1 each 0   Misc. Devices MISC Left knee brace. Diagnosis L knee pain 1 each 0   montelukast (SINGULAIR) 10 MG tablet Take 1 tablet (10 mg total) by mouth at bedtime. 30 tablet 5   naloxone (NARCAN) nasal spray 4 mg/0.1 mL Place 1 spray into the nose daily as needed for opioid reversal.     Olopatadine HCl (PATADAY) 0.2 % SOLN Use one drop in each eye once daily as needed. (Patient taking differently: Place 1 drop into both eyes daily as needed (Dry eye).) 2.5 mL 5   omeprazole (PRILOSEC) 20 MG capsule TAKE 1 CAPSULE BY MOUTH EVERY DAY 90 capsule 2   Oxycodone HCl 10 MG TABS Take 1 tablet (10 mg total) by mouth 4 (four) times daily as needed (Pain). 15 tablet 0   prazosin (MINIPRESS) 1 MG capsule Take 1 mg by mouth at bedtime.  2   prednisoLONE acetate (PRED FORTE) 1 % ophthalmic suspension Place 1 drop into the left eye 6 (six) times daily.     predniSONE (DELTASONE) 20 MG tablet Take 20 mg by mouth 3 (three) times daily.     pregabalin (LYRICA) 75 MG capsule Take 1 capsule by mouth 4 (four) times daily as needed (pain).     SIMBRINZA 1-0.2 % SUSP Place 1 drop into both eyes daily as needed for allergies.     Vitamin D3 (VITAMIN D) 25 MCG tablet Take 1,000 Units by mouth daily.     No current facility-administered medications for this visit.    REVIEW OF SYSTEMS:  [X]  denotes positive finding, [ ]  denotes negative finding Cardiac  Comments:  Chest pain or chest pressure:    Shortness of breath upon exertion:    Short of breath when lying flat:  Irregular heart rhythm:        Vascular    Pain in calf, thigh, or hip brought on by ambulation:    Pain in feet at night that wakes you up from your sleep:     Blood clot in your veins:    Leg swelling:         Pulmonary    Oxygen at home:    Productive cough:     Wheezing:         Neurologic    Sudden weakness in arms  or legs:     Sudden numbness in arms or legs:     Sudden onset of difficulty speaking or slurred speech:    Temporary loss of vision in one eye:     Problems with dizziness:         Gastrointestinal    Blood in stool:     Vomited blood:         Genitourinary    Burning when urinating:     Blood in urine:        Psychiatric    Major depression:         Hematologic    Bleeding problems:    Problems with blood clotting too easily:        Skin    Rashes or ulcers:        Constitutional    Fever or chills:     PHYSICAL EXAM:   Vitals:   03/30/21 1228  BP: 127/76  Pulse: 100  Resp: 20  Temp: 98.7 F (37.1 C)  SpO2: 98%  Weight: 260 lb (117.9 kg)  Height: 6' 1"  (1.854 m)    Constitutional: well appearing in no distress. Appears well nourished.  Neurologic: CN intact. No focal findings. No sensory loss. Psychiatric: Mood and affect symmetric and appropriate. Eyes: No icterus. No conjunctival pallor. Ears, nose, throat: mucous membranes moist. Midline trachea.  Cardiac: irregular rate and rhythm.  Respiratory: unlabored. Abdominal: soft, non-tender, non-distended.  Vascular: 2+ DP pulses. Extremity: No edema. No cyanosis. No pallor.  Skin: No gangrene. No ulceration.  Lymphatic: No Stemmer's sign. No palpable lymphadenopathy.   DATA REVIEW:    Most recent CBC CBC Latest Ref Rng & Units 11/23/2020 08/19/2020 01/03/2020  WBC 3.4 - 10.8 x10E3/uL 8.1 - 4.8  Hemoglobin 13.0 - 17.7 g/dL 13.0 15.3 14.2  Hematocrit 37.5 - 51.0 % 39.4 45.0 45.2  Platelets 150 - 450 x10E3/uL 190 - 215     Most recent CMP CMP Latest Ref Rng & Units 02/09/2021 08/19/2020 01/03/2020  Glucose 65 - 99 mg/dL 404(H) 111(H) 177(H)  BUN 6 - 24 mg/dL 16 12 9   Creatinine 0.76 - 1.27 mg/dL 1.14 1.30(H) 1.17  Sodium 134 - 144 mmol/L 137 141 138  Potassium 3.5 - 5.2 mmol/L 4.6 3.6 3.6  Chloride 96 - 106 mmol/L 100 104 102  CO2 20 - 29 mmol/L 20 - 21  Calcium 8.7 - 10.2 mg/dL 9.4 - 9.3  Total Protein  6.0 - 8.5 g/dL - - 7.3  Total Bilirubin 0.0 - 1.2 mg/dL - - 0.4  Alkaline Phos 48 - 121 IU/L - - 90  AST 0 - 40 IU/L - - 13  ALT 0 - 44 IU/L - - 14    Renal function CrCl cannot be calculated (Patient's most recent lab result is older than the maximum 21 days allowed.).  HbA1c, POC (controlled diabetic range) (%)  Date Value  02/10/2021 9.0 (A)    LDL  Calculated  Date Value Ref Range Status  05/07/2018 97 0 - 99 mg/dL Final     Vascular Imaging: CTA A/P 09/08/20 Thrombosis of right internal iliac artery aneurysm after plug / coil embolization.   Yevonne Aline. Stanford Breed, MD [facility] Phone Number: [phone] 03/28/2021 3:51 PM

## 2021-03-29 DIAGNOSIS — I480 Paroxysmal atrial fibrillation: Secondary | ICD-10-CM | POA: Diagnosis not present

## 2021-03-29 NOTE — Progress Notes (Signed)
VIALS MADE. EXP 03-29-22

## 2021-03-30 ENCOUNTER — Encounter: Payer: Self-pay | Admitting: Vascular Surgery

## 2021-03-30 ENCOUNTER — Ambulatory Visit (HOSPITAL_COMMUNITY)
Admission: RE | Admit: 2021-03-30 | Discharge: 2021-03-30 | Disposition: A | Discharge status: Home / Self Care | Payer: Medicaid Other | Source: Ambulatory Visit | Attending: Vascular Surgery | Admitting: Vascular Surgery

## 2021-03-30 ENCOUNTER — Ambulatory Visit (INDEPENDENT_AMBULATORY_CARE_PROVIDER_SITE_OTHER): Payer: Medicaid Other | Admitting: Vascular Surgery

## 2021-03-30 ENCOUNTER — Other Ambulatory Visit: Payer: Self-pay

## 2021-03-30 VITALS — BP 127/76 | HR 100 | Temp 98.7°F | Resp 20 | Ht 73.0 in | Wt 260.0 lb

## 2021-03-30 DIAGNOSIS — I480 Paroxysmal atrial fibrillation: Secondary | ICD-10-CM | POA: Diagnosis not present

## 2021-03-30 DIAGNOSIS — I723 Aneurysm of iliac artery: Secondary | ICD-10-CM | POA: Insufficient documentation

## 2021-03-30 DIAGNOSIS — N529 Male erectile dysfunction, unspecified: Secondary | ICD-10-CM

## 2021-03-31 DIAGNOSIS — J301 Allergic rhinitis due to pollen: Secondary | ICD-10-CM | POA: Diagnosis not present

## 2021-03-31 DIAGNOSIS — I480 Paroxysmal atrial fibrillation: Secondary | ICD-10-CM | POA: Diagnosis not present

## 2021-04-01 DIAGNOSIS — I480 Paroxysmal atrial fibrillation: Secondary | ICD-10-CM | POA: Diagnosis not present

## 2021-04-01 DIAGNOSIS — J3089 Other allergic rhinitis: Secondary | ICD-10-CM | POA: Diagnosis not present

## 2021-04-02 ENCOUNTER — Ambulatory Visit (INDEPENDENT_AMBULATORY_CARE_PROVIDER_SITE_OTHER): Payer: Medicaid Other

## 2021-04-02 DIAGNOSIS — I480 Paroxysmal atrial fibrillation: Secondary | ICD-10-CM | POA: Diagnosis not present

## 2021-04-02 DIAGNOSIS — J309 Allergic rhinitis, unspecified: Secondary | ICD-10-CM | POA: Diagnosis not present

## 2021-04-03 DIAGNOSIS — I480 Paroxysmal atrial fibrillation: Secondary | ICD-10-CM | POA: Diagnosis not present

## 2021-04-04 DIAGNOSIS — I480 Paroxysmal atrial fibrillation: Secondary | ICD-10-CM | POA: Diagnosis not present

## 2021-04-05 DIAGNOSIS — I480 Paroxysmal atrial fibrillation: Secondary | ICD-10-CM | POA: Diagnosis not present

## 2021-04-06 DIAGNOSIS — I480 Paroxysmal atrial fibrillation: Secondary | ICD-10-CM | POA: Diagnosis not present

## 2021-04-07 DIAGNOSIS — H209 Unspecified iridocyclitis: Secondary | ICD-10-CM | POA: Diagnosis not present

## 2021-04-07 DIAGNOSIS — I480 Paroxysmal atrial fibrillation: Secondary | ICD-10-CM | POA: Diagnosis not present

## 2021-04-07 DIAGNOSIS — H0510 Unspecified chronic inflammatory disorders of orbit: Secondary | ICD-10-CM | POA: Diagnosis not present

## 2021-04-07 DIAGNOSIS — H11442 Conjunctival cysts, left eye: Secondary | ICD-10-CM | POA: Diagnosis not present

## 2021-04-08 DIAGNOSIS — I480 Paroxysmal atrial fibrillation: Secondary | ICD-10-CM | POA: Diagnosis not present

## 2021-04-09 ENCOUNTER — Ambulatory Visit (INDEPENDENT_AMBULATORY_CARE_PROVIDER_SITE_OTHER): Payer: Medicaid Other

## 2021-04-09 DIAGNOSIS — J309 Allergic rhinitis, unspecified: Secondary | ICD-10-CM

## 2021-04-09 DIAGNOSIS — H11822 Conjunctivochalasis, left eye: Secondary | ICD-10-CM | POA: Diagnosis not present

## 2021-04-09 DIAGNOSIS — I480 Paroxysmal atrial fibrillation: Secondary | ICD-10-CM | POA: Diagnosis not present

## 2021-04-10 DIAGNOSIS — I480 Paroxysmal atrial fibrillation: Secondary | ICD-10-CM | POA: Diagnosis not present

## 2021-04-11 DIAGNOSIS — I480 Paroxysmal atrial fibrillation: Secondary | ICD-10-CM | POA: Diagnosis not present

## 2021-04-12 DIAGNOSIS — I480 Paroxysmal atrial fibrillation: Secondary | ICD-10-CM | POA: Diagnosis not present

## 2021-04-13 DIAGNOSIS — I480 Paroxysmal atrial fibrillation: Secondary | ICD-10-CM | POA: Diagnosis not present

## 2021-04-14 ENCOUNTER — Telehealth: Payer: Self-pay | Admitting: Family Medicine

## 2021-04-14 ENCOUNTER — Ambulatory Visit: Payer: Self-pay | Admitting: *Deleted

## 2021-04-14 DIAGNOSIS — I480 Paroxysmal atrial fibrillation: Secondary | ICD-10-CM | POA: Diagnosis not present

## 2021-04-14 NOTE — Telephone Encounter (Signed)
He is diabetic with an A1c of 9.0.  He can cut back on metformin to once daily and I will reassess at his upcoming visit.

## 2021-04-14 NOTE — Telephone Encounter (Signed)
Pt calling in regards to a recent visit with [facility]. He states that during the appt they discussed Erectile Dysfunction. He states that he was advised by provider to reach out to PCP to get a medication to help with this. Please advise.       [id number] [address], [place] [address]  Phone: [phone] Fax: [phone]  Hours: Not open 24 hours

## 2021-04-14 NOTE — Telephone Encounter (Signed)
Will need an office visit to address

## 2021-04-14 NOTE — Telephone Encounter (Signed)
Per agent: "Pt called stating that he is having fatigue and loss of appetite due to the medication, metformin, that he is on due to his diabetes. He states that he is concerned and is requesting to have a call back. Please advise. "  Pt reports increased fatigue, no energy, loss of appetite since starting Metformin.States took for one week "Felt bad, didn't take for a day, felt better, took again and felt bad again." Last took 2 weeks ago and "Feel a lot better without it." States BS have been 101, 110 with highest at 165. States "I won't be taking them, I might not even be diabetic."  Also reports [facility] MD advised to have PCP handle med request for ED. There is a previous encounter regarding this issue. Assured pt NT would route to practice for review and final disposition. Pt verbalizes understanding.       Reason for Disposition  [1] Caller has NON-URGENT medicine question about med that PCP prescribed AND [2] triager unable to answer question  Answer Assessment - Initial Assessment Questions 1. NAME of MEDICATION: "What medicine are you calling about?"     Med for E.D. Also not taking metformin. 2. QUESTION: "What is your question?" (e.g., double dose of medicine, side effect)     *No Answer* 3. PRESCRIBING HCP: "Who prescribed it?" Reason: if prescribed by specialist, call should be referred to that group.     *No Answer* 4. SYMPTOMS: "Do you have any symptoms?"     *No Answer* 5. SEVERITY: If symptoms are present, ask "Are they mild, moderate or severe?"     *No Answer* 6. PREGNANCY:  "Is there any chance that you are pregnant?" "When was your last menstrual period?"     *No Answer*  Protocols used: Medication Question Call-A-AH

## 2021-04-15 DIAGNOSIS — I480 Paroxysmal atrial fibrillation: Secondary | ICD-10-CM | POA: Diagnosis not present

## 2021-04-16 ENCOUNTER — Ambulatory Visit (INDEPENDENT_AMBULATORY_CARE_PROVIDER_SITE_OTHER): Payer: Medicaid Other | Admitting: *Deleted

## 2021-04-16 DIAGNOSIS — J309 Allergic rhinitis, unspecified: Secondary | ICD-10-CM | POA: Diagnosis not present

## 2021-04-16 DIAGNOSIS — I480 Paroxysmal atrial fibrillation: Secondary | ICD-10-CM | POA: Diagnosis not present

## 2021-04-17 DIAGNOSIS — I480 Paroxysmal atrial fibrillation: Secondary | ICD-10-CM | POA: Diagnosis not present

## 2021-04-18 DIAGNOSIS — I480 Paroxysmal atrial fibrillation: Secondary | ICD-10-CM | POA: Diagnosis not present

## 2021-04-19 DIAGNOSIS — I480 Paroxysmal atrial fibrillation: Secondary | ICD-10-CM | POA: Diagnosis not present

## 2021-04-19 NOTE — Telephone Encounter (Signed)
Called pt left VM , unable to reach

## 2021-04-20 DIAGNOSIS — I480 Paroxysmal atrial fibrillation: Secondary | ICD-10-CM | POA: Diagnosis not present

## 2021-04-21 DIAGNOSIS — I480 Paroxysmal atrial fibrillation: Secondary | ICD-10-CM | POA: Diagnosis not present

## 2021-04-22 ENCOUNTER — Ambulatory Visit (INDEPENDENT_AMBULATORY_CARE_PROVIDER_SITE_OTHER): Payer: Medicaid Other | Admitting: *Deleted

## 2021-04-22 DIAGNOSIS — J309 Allergic rhinitis, unspecified: Secondary | ICD-10-CM | POA: Diagnosis not present

## 2021-04-22 DIAGNOSIS — I480 Paroxysmal atrial fibrillation: Secondary | ICD-10-CM | POA: Diagnosis not present

## 2021-04-22 NOTE — Telephone Encounter (Signed)
Pt stated he will not take any metformin at all and that he needs to f /u with his vascular surgeon for his erection problems. Pt mentioned his blood sugars readings are not going up more than 165 and does not understand how is he diabetic. Also stated would like Dr Margarita Rana to assist with this issue and prescribe a possible medication but not Viagra. Kindly advice and assist. Thank you

## 2021-04-22 NOTE — Telephone Encounter (Signed)
Please schedule a visit to discuss - could be virtual

## 2021-04-22 NOTE — Telephone Encounter (Signed)
Called pt to schedule appt. No answer, LVM to CB to schedule.

## 2021-04-23 DIAGNOSIS — I480 Paroxysmal atrial fibrillation: Secondary | ICD-10-CM | POA: Diagnosis not present

## 2021-04-23 NOTE — Telephone Encounter (Signed)
Called pt made aware of MD message and upcoming appt. Verbalized understanding

## 2021-04-24 DIAGNOSIS — I480 Paroxysmal atrial fibrillation: Secondary | ICD-10-CM | POA: Diagnosis not present

## 2021-04-25 DIAGNOSIS — I480 Paroxysmal atrial fibrillation: Secondary | ICD-10-CM | POA: Diagnosis not present

## 2021-04-26 ENCOUNTER — Encounter: Payer: Self-pay | Admitting: Family Medicine

## 2021-04-26 ENCOUNTER — Other Ambulatory Visit: Payer: Self-pay

## 2021-04-26 ENCOUNTER — Ambulatory Visit: Payer: No Typology Code available for payment source | Attending: Family Medicine | Admitting: Family Medicine

## 2021-04-26 VITALS — BP 126/80 | HR 84 | Ht 73.0 in | Wt 243.0 lb

## 2021-04-26 DIAGNOSIS — E1165 Type 2 diabetes mellitus with hyperglycemia: Secondary | ICD-10-CM | POA: Diagnosis not present

## 2021-04-26 DIAGNOSIS — I480 Paroxysmal atrial fibrillation: Secondary | ICD-10-CM | POA: Diagnosis not present

## 2021-04-26 DIAGNOSIS — Z1211 Encounter for screening for malignant neoplasm of colon: Secondary | ICD-10-CM

## 2021-04-26 DIAGNOSIS — Z23 Encounter for immunization: Secondary | ICD-10-CM

## 2021-04-26 DIAGNOSIS — J4520 Mild intermittent asthma, uncomplicated: Secondary | ICD-10-CM

## 2021-04-26 DIAGNOSIS — M5416 Radiculopathy, lumbar region: Secondary | ICD-10-CM

## 2021-04-26 DIAGNOSIS — E1169 Type 2 diabetes mellitus with other specified complication: Secondary | ICD-10-CM | POA: Diagnosis not present

## 2021-04-26 DIAGNOSIS — N528 Other male erectile dysfunction: Secondary | ICD-10-CM | POA: Diagnosis not present

## 2021-04-26 DIAGNOSIS — I10 Essential (primary) hypertension: Secondary | ICD-10-CM

## 2021-04-26 LAB — POCT GLYCOSYLATED HEMOGLOBIN (HGB A1C): HbA1c, POC (controlled diabetic range): 6.4 % (ref 0.0–7.0)

## 2021-04-26 LAB — GLUCOSE, POCT (MANUAL RESULT ENTRY): POC Glucose: 120 mg/dl — AB (ref 70–99)

## 2021-04-26 NOTE — Progress Notes (Signed)
Subjective:  Patient ID: Jason Stokes, male    DOB: 06/18/1961  Age: 60 y.o. MRN: [id number]  CC: Diabetes   HPI Jason Stokes is a 60 y.o. year old male with a history of hypertension, type 2 diabetes mellitus (A1c 6.4), bipolar disorder, paroxysmal A. 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  Interval History: A1c is 6.4 down from 9.0. Fasting 101- 126.  He was prescribed Jardiance at his last visit which was not covered by insurance and subsequently switched to metformin which she states he did not do well on and discontinued it.  His A1c has surprisingly improved despite not being on any medications.  Swelling has improved in his lower extremities.  He was last seen by vascular on 03/30/2021 and encouraged to continue with compression stockings.  He continues to experience numbness in his feet. Complains of erectile dysfunction.  He tried Viagra which he received from a friend but he went into Afib.  He would like something else for it.  He states he is due to have an appointment with pain management even though I had referred him in 11/2020.  Notes in epic reveal attempt to reach patient to obtain previous records from [facility] pain management failed. Past Medical History:  Diagnosis Date   Anxiety    Bilateral carpal tunnel syndrome 01/10/2018   Bipolar disorder (HCC)    Chronic lower back pain    Depression    Dysrhythmia    a-fib   GERD (gastroesophageal reflux disease)    History of alcohol abuse    History of nuclear stress test    Myoview 10/16: EF 50%, diaphragmatic attenuation, no ischemia, low risk   Hypertension    Migraine 2012-2014   Moderate persistent asthma with acute exacerbation 05/02/2018   PAF (paroxysmal atrial fibrillation) ([place]) 03/27/2015   a. Myoview neg for ischemia >> Flecainide started 10/16 >> FU ETT     Schizophrenia ([place])    Sciatica neuralgia    Small vessel disease ([place])    Right basal ganglia stroke   Stroke [facility]) "between 2012-2014"   residual "AF" (09/22/2015)    Past Surgical History:  Procedure Laterality Date   ATRIAL FIBRILLATION ABLATION  09/22/2015   CYST EXCISION  1996-97   surgery back of head    ELECTROPHYSIOLOGIC STUDY N/A 09/22/2015   Procedure: Atrial Fibrillation Ablation;  Surgeon: Will [name], MD;  Location: [place] [facility];  Service: Cardiovascular;  Laterality: N/A;   ELECTROPHYSIOLOGIC STUDY N/A 12/10/2015   Procedure: Atrial Fibrillation Ablation;  Surgeon: Will [name], MD;  Location: [place] [facility];  Service: Cardiovascular;  Laterality: N/A;   ELECTROPHYSIOLOGIC STUDY N/A 12/11/2015   Procedure: Cardioversion;  Surgeon: Will [name], MD;  Location: [place] [facility];  Service: Cardiovascular;  Laterality: N/A;   EMBOLIZATION Right 08/19/2020   Procedure: EMBOLIZATION;  Surgeon: Cherre Robins, MD;  Location: [place] [facility];  Service: Cardiovascular;  Laterality: Right;  hypogastric   EXCISION MASS HEAD N/A 01/06/2017   Procedure: EXCISION MASS FOREHEAD;  Surgeon: Irene Limbo, MD;  Location: [place];  Service: Plastics;  Laterality: N/A;   GANGLION CYST EXCISION Left    INTERCOSTAL NERVE BLOCK  2005   KNEE ARTHROSCOPY Right 2016   MASS EXCISION N/A 01/25/2021   Procedure: EXCISION SUBCUTANEOUS VS SUBFASCIAL MASS TORSO 3CM;  Surgeon: Irene Limbo, MD;  Location: [place];  Service: Plastics;  Laterality: N/A;    Family History  Problem Relation Age of Onset   Heart disease Father    Schizophrenia Sister     Allergies  Allergen Reactions   Lisinopril Anaphylaxis    Swelling of lips and tongue.    Outpatient Medications Prior to Visit  Medication Sig Dispense Refill   Accu-Chek Softclix Lancets lancets Use as instructed 3 times daily before meals 100 each 12   albuterol  (PROAIR HFA) 108 (90 Base) MCG/ACT inhaler Inhale 2 puffs into the lungs every 4 (four) hours as needed for wheezing or shortness of breath. 18 g 2   ALPHAGAN P 0.1 % SOLN Place 1 drop into the left eye 2 (two) times daily.     atorvastatin (LIPITOR) 40 MG tablet Take 1 tablet (40 mg total) by mouth daily. 30 tablet 6   Azelastine HCl 0.15 % SOLN Place 1 spray into both nostrils 2 (two) times daily. 30 mL 5   baclofen (LIORESAL) 10 MG tablet Take 10 mg by mouth 3 (three) times daily as needed for muscle pain.     Blood Glucose Monitoring Suppl (ACCU-CHEK AVIVA PLUS) w/Device KIT Use 3 times daily before meals 1 kit 0   carvedilol (COREG) 25 MG tablet Take 1 tablet (25 mg total) by mouth 2 (two) times daily. 180 tablet 1   cetirizine (ZYRTEC) 10 MG tablet Take 1 tablet (10 mg total) by mouth daily. 60 tablet 5   CYMBALTA 30 MG capsule Take 30 mg by mouth at bedtime.     diltiazem (CARDIZEM CD) 180 MG 24 hr capsule Take 1 capsule (180 mg total) by mouth daily. 90 capsule 1   dronedarone (MULTAQ) 400 MG tablet Take 1 tablet (400 mg total) by mouth 2 (two) times daily with a meal. 180 tablet 3   ELIQUIS 5 MG TABS tablet TAKE 1 TABLET BY MOUTH TWICE A DAY 60 tablet 6   empagliflozin (JARDIANCE) 10 MG TABS tablet Take 1 tablet (10 mg total) by mouth daily. 30 tablet 3   fluticasone (FLONASE) 50 MCG/ACT nasal spray Place 1 spray into both nostrils 2 (two) times daily. 16 g 5   fluticasone furoate-vilanterol (BREO ELLIPTA) 200-25 MCG/INH AEPB Inhale 1 puff into the lungs daily. 60 each 2   folic acid (FOLVITE) 1 MG tablet Take 1 mg by mouth daily.     furosemide (LASIX) 20 MG tablet Take 1 tablet daily, as needed, for swelling 30 tablet 1   GARLIC PO Take 1 tablet by mouth daily as needed (To build immune system up).     glucose blood (ACCU-CHEK AVIVA PLUS) test strip Use as instructed 3 times daily before meals 100 each 12   hydrALAZINE (APRESOLINE) 100 MG tablet TAKE 1 TABLET (100 MG TOTAL) BY MOUTH 3  (THREE) TIMES DAILY. 270 tablet 2   metFORMIN (GLUCOPHAGE) 500 MG tablet Take 1 tablet (500 mg total) by mouth 2 (two) times daily with a meal. 60 tablet 3   methocarbamol (ROBAXIN) 500 MG tablet Take 1 tablet (500 mg total) by mouth every 8 (eight) hours as needed for muscle spasms. 30 tablet 1   methotrexate (RHEUMATREX) 2.5 MG tablet Take 2.5 mg by mouth daily.     Misc. Devices MISC Back brace. Diagnosis chronic back pain 1 each 0   Misc. Devices MISC Left knee brace. Diagnosis L knee pain 1 each 0   montelukast (SINGULAIR) 10 MG tablet Take 1 tablet (10  mg total) by mouth at bedtime. 30 tablet 5   naloxone (NARCAN) nasal spray 4 mg/0.1 mL Place 1 spray into the nose daily as needed for opioid reversal.     Olopatadine HCl (PATADAY) 0.2 % SOLN Use one drop in each eye once daily as needed. (Patient taking differently: Place 1 drop into both eyes daily as needed (Dry eye).) 2.5 mL 5   omeprazole (PRILOSEC) 20 MG capsule TAKE 1 CAPSULE BY MOUTH EVERY DAY 90 capsule 2   Oxycodone HCl 10 MG TABS Take 1 tablet (10 mg total) by mouth 4 (four) times daily as needed (Pain). 15 tablet 0   prazosin (MINIPRESS) 1 MG capsule Take 1 mg by mouth at bedtime.  2   prednisoLONE acetate (PRED FORTE) 1 % ophthalmic suspension Place 1 drop into the left eye 6 (six) times daily.     predniSONE (DELTASONE) 20 MG tablet Take 20 mg by mouth 3 (three) times daily.     pregabalin (LYRICA) 75 MG capsule Take 1 capsule by mouth 4 (four) times daily as needed (pain).     SIMBRINZA 1-0.2 % SUSP Place 1 drop into both eyes daily as needed for allergies.     Vitamin D3 (VITAMIN D) 25 MCG tablet Take 1,000 Units by mouth daily.     No facility-administered medications prior to visit.     ROS Review of Systems  Constitutional:  Negative for activity change and appetite change.  HENT:  Negative for sinus pressure and sore throat.   Eyes:  Negative for visual disturbance.  Respiratory:  Negative for cough, chest  tightness and shortness of breath.   Cardiovascular:  Negative for chest pain and leg swelling.  Gastrointestinal:  Negative for abdominal distention, abdominal pain, constipation and diarrhea.  Endocrine: Negative.   Genitourinary:  Negative for dysuria.  Musculoskeletal:  Positive for back pain. Negative for joint swelling and myalgias.  Skin:  Negative for rash.  Allergic/Immunologic: Negative.   Neurological:  Positive for numbness. Negative for weakness and light-headedness.  Psychiatric/Behavioral:  Negative for dysphoric mood and suicidal ideas.    Objective:  BP 126/80   Pulse 84   Ht 6' 1"  (1.854 m)   Wt 243 lb (110.2 kg)   SpO2 99%   BMI 32.06 kg/m   BP/Weight 04/26/2021 10/08/4008 08/24/2534  Systolic BP 644 034 742  Diastolic BP 80 76 76  Wt. (Lbs) 243 260 261.8  BMI 32.06 34.3 34.54  Some encounter information is confidential and restricted. Go to Review Flowsheets activity to see all data.      Physical Exam Constitutional:      Appearance: He is well-developed.  Cardiovascular:     Rate and Rhythm: Normal rate.     Heart sounds: Normal heart sounds. No murmur heard. Pulmonary:     Effort: Pulmonary effort is normal.     Breath sounds: Normal breath sounds. No wheezing or rales.  Chest:     Chest wall: No tenderness.  Abdominal:     General: Bowel sounds are normal. There is no distension.     Palpations: Abdomen is soft. There is no mass.     Tenderness: There is no abdominal tenderness.  Musculoskeletal:        General: Normal range of motion.     Right lower leg: No edema.     Left lower leg: No edema.  Neurological:     Mental Status: He is alert and oriented to person, place, and time.  Psychiatric:  Mood and Affect: Mood normal.    CMP Latest Ref Rng & Units 02/09/2021 08/19/2020 01/03/2020  Glucose 65 - 99 mg/dL 404(H) 111(H) 177(H)  BUN 6 - 24 mg/dL 16 12 9   Creatinine 0.76 - 1.27 mg/dL 1.14 1.30(H) 1.17  Sodium 134 - 144 mmol/L 137 141  138  Potassium 3.5 - 5.2 mmol/L 4.6 3.6 3.6  Chloride 96 - 106 mmol/L 100 104 102  CO2 20 - 29 mmol/L 20 - 21  Calcium 8.7 - 10.2 mg/dL 9.4 - 9.3  Total Protein 6.0 - 8.5 g/dL - - 7.3  Total Bilirubin 0.0 - 1.2 mg/dL - - 0.4  Alkaline Phos 48 - 121 IU/L - - 90  AST 0 - 40 IU/L - - 13  ALT 0 - 44 IU/L - - 14    Lipid Panel     Component Value Date/Time   CHOL 171 05/07/2018 1004   TRIG 204 (H) 05/07/2018 1004   HDL 33 (L) 05/07/2018 1004   CHOLHDL 5.2 (H) 05/07/2018 1004   CHOLHDL 5.4 10/17/2013 1042   VLDL 36 10/17/2013 1042   LDLCALC 97 05/07/2018 1004    CBC    Component Value Date/Time   WBC 8.1 11/23/2020 1535   WBC 5.8 08/08/2019 1430   RBC 5.19 11/23/2020 1535   RBC 5.90 (H) 08/08/2019 1430   HGB 13.0 11/23/2020 1535   HCT 39.4 11/23/2020 1535   PLT 190 11/23/2020 1535   MCV 76 (L) 11/23/2020 1535   MCH 25.0 (L) 11/23/2020 1535   MCH 24.9 (L) 08/08/2019 1430   MCHC 33.0 11/23/2020 1535   MCHC 31.5 08/08/2019 1430   RDW 19.2 (H) 11/23/2020 1535   LYMPHSABS 1.6 08/08/2019 1430   LYMPHSABS 2.0 09/13/2016 0832   MONOABS 0.7 08/08/2019 1430   EOSABS 0.2 08/08/2019 1430   EOSABS 0.1 09/13/2016 0832   BASOSABS 0.0 08/08/2019 1430   BASOSABS 0.0 09/13/2016 8466    Lab Results  Component Value Date   HGBA1C 6.4 04/26/2021    Assessment & Plan:  1. Type 2 diabetes mellitus with other specified complication, without long-term current use of insulin (HCC) A1c of 6.4 which is improved from 9.0 previously; goal is less than 7.0 Currently not on medications Will continue lifestyle modification and diet control He declines a foot exam - POCT glucose (manual entry) - POCT glycosylated hemoglobin (Hb A1C)  2. Other male erectile dysfunction He went into A. fib when he used Viagra in the past Advised that I would need to defer initiation of such medications to his cardiologist given previous reaction  3. Paroxysmal atrial fibrillation (HCC) Currently in sinus  rhythm Continue Eliquis, continue carvedilol for rate control Also on diltiazem, Multaq Follow-up with EP  4. Essential hypertension Controlled Continue current antihypertensives Counseled on blood pressure goal of less than 130/80, low-sodium, DASH diet, medication compliance, 150 minutes of moderate intensity exercise per week. Discussed medication compliance, adverse effects.  5. Lumbar radiculopathy Provided number to [facility] where he was referred previously - Ambulatory referral to Pain Clinic  6. Mild intermittent asthma without complication Stable Continue current inhalers Also on immunotherapy  7. Screening for colon cancer - Ambulatory referral to Gastroenterology  8. Need for immunization against influenza - Flu Vaccine QUAD 35moIM (Fluarix, Fluzone & Alfiuria Quad PF)    No orders of the defined types were placed in this encounter.         ECharlott Rakes MD, FAAFP. [place]  [place] [phone]   04/26/2021, 10:20 AM

## 2021-04-26 NOTE — Patient Instructions (Addendum)
Sent Referral to [facility] ph. # [phone] .They will contact the patient to schedule an appointment .

## 2021-04-27 DIAGNOSIS — I480 Paroxysmal atrial fibrillation: Secondary | ICD-10-CM | POA: Diagnosis not present

## 2021-04-28 ENCOUNTER — Other Ambulatory Visit: Payer: Self-pay | Admitting: Physician Assistant

## 2021-04-28 DIAGNOSIS — I480 Paroxysmal atrial fibrillation: Secondary | ICD-10-CM | POA: Diagnosis not present

## 2021-04-29 DIAGNOSIS — I480 Paroxysmal atrial fibrillation: Secondary | ICD-10-CM | POA: Diagnosis not present

## 2021-04-30 DIAGNOSIS — I480 Paroxysmal atrial fibrillation: Secondary | ICD-10-CM | POA: Diagnosis not present

## 2021-05-01 DIAGNOSIS — I480 Paroxysmal atrial fibrillation: Secondary | ICD-10-CM | POA: Diagnosis not present

## 2021-05-02 DIAGNOSIS — I480 Paroxysmal atrial fibrillation: Secondary | ICD-10-CM | POA: Diagnosis not present

## 2021-05-03 DIAGNOSIS — I480 Paroxysmal atrial fibrillation: Secondary | ICD-10-CM | POA: Diagnosis not present

## 2021-05-04 ENCOUNTER — Ambulatory Visit (INDEPENDENT_AMBULATORY_CARE_PROVIDER_SITE_OTHER): Payer: Medicaid Other

## 2021-05-04 DIAGNOSIS — J309 Allergic rhinitis, unspecified: Secondary | ICD-10-CM | POA: Diagnosis not present

## 2021-05-04 DIAGNOSIS — I480 Paroxysmal atrial fibrillation: Secondary | ICD-10-CM | POA: Diagnosis not present

## 2021-05-05 DIAGNOSIS — I480 Paroxysmal atrial fibrillation: Secondary | ICD-10-CM | POA: Diagnosis not present

## 2021-05-06 DIAGNOSIS — I480 Paroxysmal atrial fibrillation: Secondary | ICD-10-CM | POA: Diagnosis not present

## 2021-05-07 DIAGNOSIS — I480 Paroxysmal atrial fibrillation: Secondary | ICD-10-CM | POA: Diagnosis not present

## 2021-05-08 DIAGNOSIS — I480 Paroxysmal atrial fibrillation: Secondary | ICD-10-CM | POA: Diagnosis not present

## 2021-05-09 DIAGNOSIS — I480 Paroxysmal atrial fibrillation: Secondary | ICD-10-CM | POA: Diagnosis not present

## 2021-05-10 DIAGNOSIS — I480 Paroxysmal atrial fibrillation: Secondary | ICD-10-CM | POA: Diagnosis not present

## 2021-05-11 ENCOUNTER — Telehealth: Payer: Self-pay | Admitting: Orthopaedic Surgery

## 2021-05-11 DIAGNOSIS — I480 Paroxysmal atrial fibrillation: Secondary | ICD-10-CM | POA: Diagnosis not present

## 2021-05-11 NOTE — Telephone Encounter (Signed)
Pt called and stated his knee is filled with fluid and is having a hard time after his fall. Pt previously seen [facility] in June 22 and wanted to know if he needs to wait to see them in a few weeks or is there another provider they would recommend for him to see sooner to get it drained. The best call back number is [phone].

## 2021-05-11 NOTE — Telephone Encounter (Signed)
Called pt back.... we will see him on Thursday @ 1:30

## 2021-05-12 ENCOUNTER — Ambulatory Visit (INDEPENDENT_AMBULATORY_CARE_PROVIDER_SITE_OTHER): Payer: Medicaid Other

## 2021-05-12 DIAGNOSIS — J309 Allergic rhinitis, unspecified: Secondary | ICD-10-CM

## 2021-05-12 DIAGNOSIS — I480 Paroxysmal atrial fibrillation: Secondary | ICD-10-CM | POA: Diagnosis not present

## 2021-05-13 ENCOUNTER — Ambulatory Visit (INDEPENDENT_AMBULATORY_CARE_PROVIDER_SITE_OTHER): Payer: Medicaid Other | Admitting: Orthopaedic Surgery

## 2021-05-13 DIAGNOSIS — M1711 Unilateral primary osteoarthritis, right knee: Secondary | ICD-10-CM | POA: Diagnosis not present

## 2021-05-13 DIAGNOSIS — I480 Paroxysmal atrial fibrillation: Secondary | ICD-10-CM | POA: Diagnosis not present

## 2021-05-13 DIAGNOSIS — M25461 Effusion, right knee: Secondary | ICD-10-CM | POA: Diagnosis not present

## 2021-05-13 MED ORDER — LIDOCAINE HCL 1 % IJ SOLN
3.0000 mL | INTRAMUSCULAR | Status: AC | PRN
  Administered 2021-05-13: 3 mL

## 2021-05-13 MED ORDER — METHYLPREDNISOLONE ACETATE 40 MG/ML IJ SUSP
40.0000 mg | INTRAMUSCULAR | Status: AC | PRN
  Administered 2021-05-13: 40 mg via INTRA_ARTICULAR

## 2021-05-13 NOTE — Progress Notes (Signed)
Office Visit Note   [name]           Date of Birth: January 02, 1961           MRN: [id number] Visit Date: 05/13/2021              Requested by: Charlott Rakes, MD [place],  [place] [address] PCP: Charlott Rakes, MD   Assessment & Plan: Visit Diagnoses:  1. Effusion, right knee   2. Unilateral primary osteoarthritis, right knee     Plan: I did aspirate 50 cc of clear yellow fluid from his right knee consistent with his osteoarthritis and then placed a steroid injection in the knee itself.  Follow-up can be as needed.  He still wants to proceed this treatment route.  He does travel to [place] in late December and if he does need his knee aspirated again before then he can be worked in and he will let us know.  Follow-Up Instructions: Return if symptoms worsen or fail to improve.   Orders:  Orders Placed This Encounter  Procedures   Large Joint Inj   No orders of the defined types were placed in this encounter.     Procedures: Large Joint Inj: R knee on 05/13/2021 1:44 PM Indications: diagnostic evaluation and pain Details: 22 G 1.5 in needle, superolateral approach  Arthrogram: No  Medications: 3 mL lidocaine 1 %; 40 mg methylPREDNISolone acetate 40 MG/ML Outcome: tolerated well, no immediate complications Procedure, treatment alternatives, risks and benefits explained, specific risks discussed. Consent was given by the patient. Immediately prior to procedure a time out was called to verify the correct patient, procedure, equipment, support staff and site/side marked as required. Patient was prepped and draped in the usual sterile fashion.      Clinical Data: No additional findings.   Subjective: Chief Complaint  Patient presents with   Right Knee - Pain  The patient is well-known to [place].  Is been over 4 months since we aspirated his knee back in June.  This was his right knee and he has known osteoarthritis of the right knee.  We  called about 30 cc of fluid off his knee at that visit and placed a steroid in it.  He says about 3 weeks ago his knee went out on him and has had swelling since then.  He would like to have an aspiration again and a steroid injection in his knee today.  He had no acute change in medical status and is only prediabetic.  HPI  Review of Systems There is currently listed no headache, chest pain, shortness of breath, fever, chills, nausea, vomiting  Objective: Vital Signs: There were no vitals taken for this visit.  Physical Exam He is alert and orient x3 and in no acute distress Ortho Exam Examination of his right knee shows that is ligamentously stable and has an intact extensor mechanism.  There is a very large effusion. Specialty Comments:  No specialty comments available.  Imaging: No results found.   PMFS History: Patient Active Problem List   Diagnosis Date Noted   Secondary hypercoagulable state ([place]) 03/23/2021   Type 2 diabetes mellitus with hyperglycemia, without long-term current use of insulin ([place]) 02/10/2021   Neck pain on left side 03/06/2019   Musculoskeletal chest pain 07/24/2018   Asthma, mild intermittent 05/02/2018   Allergic rhinitis caused by mold 05/02/2018   Tobacco use 05/02/2018   Bilateral carpal tunnel syndrome 01/10/2018   Stroke ([place])  Schizophrenia ([place])    Migraine    History of nuclear stress test    History of alcohol abuse    GERD (gastroesophageal reflux disease)    Dysrhythmia    Chronic lower back pain    Anxiety    Arthritis of knee, right 04/12/2017   Bipolar disorder ([place]) 04/03/2017   Lipoma of forehead 09/22/2016   Trigger ring finger of right hand 06/22/2016   Paroxysmal atrial fibrillation (HCC)    AF (atrial fibrillation) ([place]) 12/10/2015   Eczema 07/10/2015   History of CVA (cerebrovascular accident) 05/04/2015   Tear of medial meniscus of right knee 03/18/2015   Chronic pain of right knee 03/12/2015   Other and  unspecified hyperlipidemia 11/25/2013   Nasal congestion 11/25/2013   Sinusitis, chronic 05/09/2013   Chronic low back pain 10/12/2012   Lumbar radiculopathy 10/12/2012   Hypertension 10/12/2012   Depression 10/12/2012   Insomnia 10/12/2012   Past Medical History:  Diagnosis Date   Anxiety    Bilateral carpal tunnel syndrome 01/10/2018   Bipolar disorder ([place])    Chronic lower back pain    Depression    Dysrhythmia    a-fib   GERD (gastroesophageal reflux disease)    History of alcohol abuse    History of nuclear stress test    Myoview 10/16: EF 50%, diaphragmatic attenuation, no ischemia, low risk   Hypertension    Migraine 2012-2014   Moderate persistent asthma with acute exacerbation 05/02/2018   PAF (paroxysmal atrial fibrillation) ([place]) 03/27/2015   a. Myoview neg for ischemia >> Flecainide started 10/16 >> FU ETT    Schizophrenia ([place])    Sciatica neuralgia    Small vessel disease (HCC)    Right basal ganglia stroke   Stroke [facility]) "between 2012-2014"   residual "AF" (09/22/2015)    Family History  Problem Relation Age of Onset   Heart disease Father    Schizophrenia Sister     Past Surgical History:  Procedure Laterality Date   ATRIAL FIBRILLATION ABLATION  09/22/2015   CYST EXCISION  1996-97   surgery back of head    ELECTROPHYSIOLOGIC STUDY N/A 09/22/2015   Procedure: Atrial Fibrillation Ablation;  Surgeon: Will [name], MD;  Location: [place] [facility];  Service: Cardiovascular;  Laterality: N/A;   ELECTROPHYSIOLOGIC STUDY N/A 12/10/2015   Procedure: Atrial Fibrillation Ablation;  Surgeon: Will [name], MD;  Location: [place] [facility];  Service: Cardiovascular;  Laterality: N/A;   ELECTROPHYSIOLOGIC STUDY N/A 12/11/2015   Procedure: Cardioversion;  Surgeon: Will [name], MD;  Location: [place] [facility];  Service: Cardiovascular;  Laterality: N/A;   EMBOLIZATION Right 08/19/2020   Procedure: EMBOLIZATION;  Surgeon: Cherre Robins, MD;   Location: [place] [facility];  Service: Cardiovascular;  Laterality: Right;  hypogastric   EXCISION MASS HEAD N/A 01/06/2017   Procedure: EXCISION MASS FOREHEAD;  Surgeon: Irene Limbo, MD;  Location: [place];  Service: Plastics;  Laterality: N/A;   GANGLION CYST EXCISION Left    INTERCOSTAL NERVE BLOCK  2005   KNEE ARTHROSCOPY Right 2016   MASS EXCISION N/A 01/25/2021   Procedure: EXCISION SUBCUTANEOUS VS SUBFASCIAL MASS TORSO 3CM;  Surgeon: Irene Limbo, MD;  Location: [place];  Service: Plastics;  Laterality: N/A;   Social History   Occupational History    Comment: disabled  Tobacco Use   Smoking status: Every Day    Packs/day: 0.25    Years: 29.00    Pack years: 7.25  Types: Cigarettes   Smokeless tobacco: Never   Tobacco comments:    1-3 cigarettes every 2-3 days 03/23/2021  Vaping Use   Vaping Use: Never used  Substance and Sexual Activity   Alcohol use: Yes    Alcohol/week: 2.0 standard drinks    Types: 2 Standard drinks or equivalent per week    Comment: 76mixed drinks   Drug use: Yes    Types: Marijuana    Comment: smoked marijuana 01-02-17 for pain   Sexual activity: Not Currently

## 2021-05-14 DIAGNOSIS — I480 Paroxysmal atrial fibrillation: Secondary | ICD-10-CM | POA: Diagnosis not present

## 2021-05-15 DIAGNOSIS — I480 Paroxysmal atrial fibrillation: Secondary | ICD-10-CM | POA: Diagnosis not present

## 2021-05-16 DIAGNOSIS — I480 Paroxysmal atrial fibrillation: Secondary | ICD-10-CM | POA: Diagnosis not present

## 2021-05-17 DIAGNOSIS — I480 Paroxysmal atrial fibrillation: Secondary | ICD-10-CM | POA: Diagnosis not present

## 2021-05-18 ENCOUNTER — Ambulatory Visit (INDEPENDENT_AMBULATORY_CARE_PROVIDER_SITE_OTHER): Payer: Medicaid Other | Admitting: Allergy and Immunology

## 2021-05-18 ENCOUNTER — Other Ambulatory Visit: Payer: Self-pay

## 2021-05-18 ENCOUNTER — Encounter: Payer: Self-pay | Admitting: Allergy and Immunology

## 2021-05-18 VITALS — BP 110/70 | HR 92 | Temp 98.4°F | Resp 18 | Ht 73.0 in | Wt 266.0 lb

## 2021-05-18 DIAGNOSIS — J301 Allergic rhinitis due to pollen: Secondary | ICD-10-CM

## 2021-05-18 DIAGNOSIS — F17200 Nicotine dependence, unspecified, uncomplicated: Secondary | ICD-10-CM

## 2021-05-18 DIAGNOSIS — H1013 Acute atopic conjunctivitis, bilateral: Secondary | ICD-10-CM

## 2021-05-18 DIAGNOSIS — J4540 Moderate persistent asthma, uncomplicated: Secondary | ICD-10-CM | POA: Diagnosis not present

## 2021-05-18 DIAGNOSIS — F1290 Cannabis use, unspecified, uncomplicated: Secondary | ICD-10-CM

## 2021-05-18 DIAGNOSIS — H1010 Acute atopic conjunctivitis, unspecified eye: Secondary | ICD-10-CM

## 2021-05-18 DIAGNOSIS — I480 Paroxysmal atrial fibrillation: Secondary | ICD-10-CM | POA: Diagnosis not present

## 2021-05-18 DIAGNOSIS — J3089 Other allergic rhinitis: Secondary | ICD-10-CM

## 2021-05-18 DIAGNOSIS — J302 Other seasonal allergic rhinitis: Secondary | ICD-10-CM

## 2021-05-18 DIAGNOSIS — J321 Chronic frontal sinusitis: Secondary | ICD-10-CM

## 2021-05-18 MED ORDER — AZELASTINE HCL 0.15 % NA SOLN: 1.0000 | Freq: Two times a day (BID) | NASAL | 5 refills | Status: DC

## 2021-05-18 MED ORDER — FLUTICASONE FUROATE-VILANTEROL 200-25 MCG/ACT IN AEPB: 1.0000 | INHALATION_SPRAY | Freq: Every day | RESPIRATORY_TRACT | 5 refills | Status: DC

## 2021-05-18 MED ORDER — OLOPATADINE HCL 0.2 % OP SOLN: OPHTHALMIC | 5 refills | Status: DC

## 2021-05-18 MED ORDER — FLUTICASONE PROPIONATE 50 MCG/ACT NA SUSP: 1.0000 | Freq: Two times a day (BID) | NASAL | 5 refills | Status: DC

## 2021-05-18 MED ORDER — CETIRIZINE HCL 10 MG PO TABS: 10.0000 mg | ORAL_TABLET | Freq: Every day | ORAL | 5 refills | Status: DC

## 2021-05-18 MED ORDER — AZELASTINE HCL 0.1 % NA SOLN: 2.0000 | Freq: Two times a day (BID) | NASAL | 5 refills | Status: DC

## 2021-05-18 MED ORDER — MONTELUKAST SODIUM 10 MG PO TABS
10.0000 mg | ORAL_TABLET | Freq: Every day | ORAL | 5 refills | Status: DC
Start: 2021-05-18 — End: 2021-11-23

## 2021-05-18 NOTE — Patient Instructions (Signed)
  1.  Perform Allergen avoidance measures as best as possible.  2.  Use nicotine substitutes to replace tobacco smoke exposure  3.  Continue to Treat and prevent inflammation:   A.  Flonase 1 spray each nostril twice a day  B.  Azelastine nasal spray 1 spray each nostril twice a day  C.  Montelukast 10 mg 1 tablet 1 time per day  D.  Breo 200 - 1 inhalation 1 time per day  E.  Immunotherapy  4. If needed:   A.  Cetirizine 10 mg 1 tablet 1-2 times a day  B.  Pataday - 1 drop each eye 1 time per day  C.  Pro-air HFA 2 puffs every 4-6 hours  5. Return to clinic in 6 months or earlier if problem

## 2021-05-18 NOTE — Progress Notes (Signed)
 [place] - [place] - [place] - [place] - [place]   Follow-up Note  Referring Provider: Newlin, Enobong, MD Primary Provider: Newlin, Enobong, MD Date of Office Visit: 05/18/2021  Subjective:   Jason Stokes (DOB: 06/16/1961) is a 60 y.o. male who [facility]21 in re-evaluation of the following:  HPI: Jason Stokes returns to this clinic in evaluation of asthma, allergic rhinoconjunctivitis, and tobacco use.  His last visit with me was 10 November 2020.  Overall he feels as though his asthma has been under pretty good control.  He still uses a bronchodilator about 4 times per week while he continues on anti-inflammatory medications for his lower airway including the use of a combination inhaler and montelukast.  He feels like his nose is doing relatively well using Flonase and occasionally some nasal azelastine.  It does not sound as though as he has required a systemic steroid or an antibiotic for any type of airway issue.  His immunotherapy is going quite well at this point in time currently at every 3 weeks without any adverse effect.  He continues to smoke cigarettes at about 1 or 2 cigarettes/day and smokes cannabis as well.  He believes that cannabis smoking helps his radiculopathy affecting his lower extremity.  He has received 2 COVID vaccines and has been infected with COVID once and has received this year's flu vaccine.  Allergies as of 05/18/2021       Reactions   Lisinopril Anaphylaxis   Swelling of lips and tongue.        Medication List    Accu-Chek Aviva Plus test strip Generic drug: glucose blood Use as instructed 3 times daily before meals   Accu-Chek Aviva Plus w/Device Kit Use 3 times daily before meals   Accu-Chek Softclix Lancets lancets Use as instructed 3 times daily before meals   Advair Diskus 500-50 MCG/ACT Aepb Generic drug: fluticasone-salmeterol Inhale 1 puff into the lungs daily.   albuterol 108  (90 Base) MCG/ACT inhaler Commonly known as: ProAir HFA Inhale 2 puffs into the lungs every 4 (four) hours as needed for wheezing or shortness of breath.   Alphagan P 0.1 % Soln Generic drug: brimonidine Place 1 drop into the left eye 2 (two) times daily.   atorvastatin 40 MG tablet Commonly known as: LIPITOR Take 1 tablet (40 mg total) by mouth daily.   Azelastine HCl 0.15 % Soln Place 1 spray into both nostrils 2 (two) times daily.   baclofen 10 MG tablet Commonly known as: LIORESAL Take 10 mg by mouth 3 (three) times daily as needed for muscle pain.   Breo Ellipta 200-25 MCG/ACT Aepb Generic drug: fluticasone furoate-vilanterol Inhale 1 puff into the lungs daily.   carvedilol 25 MG tablet Commonly known as: COREG Take 1 tablet (25 mg total) by mouth 2 (two) times daily.   cetirizine 10 MG tablet Commonly known as: ZYRTEC Take 1 tablet (10 mg total) by mouth daily.   Cymbalta 30 MG capsule Generic drug: DULoxetine Take 30 mg by mouth at bedtime.   diltiazem 180 MG 24 hr capsule Commonly known as: CARDIZEM CD TAKE 1 CAPSULE BY MOUTH EVERY DAY   dronedarone 400 MG tablet Commonly known as: MULTAQ Take 1 tablet (400 mg total) by mouth 2 (two) times daily with a meal.   Eliquis 5 MG Tabs tablet Generic drug: apixaban TAKE 1 TABLET BY MOUTH TWICE A DAY   fluticasone 50 MCG/ACT nasal spray Commonly known as: FLONASE   Place 1 spray into both nostrils 2 (two) times daily.   folic acid 1 MG tablet Commonly known as: FOLVITE Take 1 mg by mouth daily.   furosemide 20 MG tablet Commonly known as: LASIX Take 1 tablet daily, as needed, for swelling   GARLIC PO Take 1 tablet by mouth daily as needed (To build immune system up).   hydrALAZINE 100 MG tablet Commonly known as: APRESOLINE TAKE 1 TABLET (100 MG TOTAL) BY MOUTH 3 (THREE) TIMES DAILY.   methocarbamol 500 MG tablet Commonly known as: Robaxin Take 1 tablet (500 mg total) by mouth every 8 (eight) hours as  needed for muscle spasms.   methotrexate 2.5 MG tablet Commonly known as: RHEUMATREX Take 2.5 mg by mouth daily.   Misc. Devices Misc Back brace. Diagnosis chronic back pain   Misc. Devices Misc Left knee brace. Diagnosis L knee pain   montelukast 10 MG tablet Commonly known as: Singulair Take 1 tablet (10 mg total) by mouth at bedtime.   naloxone 4 MG/0.1ML Liqd nasal spray kit Commonly known as: NARCAN Place 1 spray into the nose daily as needed for opioid reversal.   Olopatadine HCl 0.2 % Soln Commonly known as: Pataday Use one drop in each eye once daily as needed.   omeprazole 20 MG capsule Commonly known as: PRILOSEC TAKE 1 CAPSULE BY MOUTH EVERY DAY   Oxycodone HCl 10 MG Tabs Take 1 tablet (10 mg total) by mouth 4 (four) times daily as needed (Pain).   prazosin 1 MG capsule Commonly known as: MINIPRESS Take 1 mg by mouth at bedtime.   prednisoLONE acetate 1 % ophthalmic suspension Commonly known as: PRED FORTE Place 1 drop into the left eye 6 (six) times daily.   pregabalin 75 MG capsule Commonly known as: LYRICA Take 1 capsule by mouth 4 (four) times daily as needed (pain).   Simbrinza 1-0.2 % Susp Generic drug: Brinzolamide-Brimonidine Place 1 drop into both eyes daily as needed for allergies.   Vitamin D3 25 MCG tablet Commonly known as: Vitamin D Take 1,000 Units by mouth daily.    Past Medical History:  Diagnosis Date   Anxiety    Bilateral carpal tunnel syndrome 01/10/2018   Bipolar disorder (HCC)    Chronic lower back pain    Depression    Dysrhythmia    a-fib   GERD (gastroesophageal reflux disease)    History of alcohol abuse    History of nuclear stress test    Myoview 10/16: EF 50%, diaphragmatic attenuation, no ischemia, low risk   Hypertension    Migraine 2012-2014   Moderate persistent asthma with acute exacerbation 05/02/2018   PAF (paroxysmal atrial fibrillation) ([place]) 03/27/2015   a. Myoview neg for ischemia >> Flecainide  started 10/16 >> FU ETT    Schizophrenia ([place])    Sciatica neuralgia    Small vessel disease ([place])    Right basal ganglia stroke   Stroke [facility]) "between 2012-2014"   residual "AF" (09/22/2015)    Past Surgical History:  Procedure Laterality Date   ATRIAL FIBRILLATION ABLATION  09/22/2015   CYST EXCISION  1996-97   surgery back of head    ELECTROPHYSIOLOGIC STUDY N/A 09/22/2015   Procedure: Atrial Fibrillation Ablation;  Surgeon: Will [name], MD;  Location: [place] [facility];  Service: Cardiovascular;  Laterality: N/A;   ELECTROPHYSIOLOGIC STUDY N/A 12/10/2015   Procedure: Atrial Fibrillation Ablation;  Surgeon: Will [name], MD;  Location: [place] [facility];  Service: Cardiovascular;  Laterality: N/A;  ELECTROPHYSIOLOGIC STUDY N/A 12/11/2015   Procedure: Cardioversion;  Surgeon: Will Martin Camnitz, MD;  Location: [facility];  Service: Cardiovascular;  Laterality: N/A;   EMBOLIZATION Right 08/19/2020   Procedure: EMBOLIZATION;  Surgeon: Hawken, Thomas N, MD;  Location: [facility];  Service: Cardiovascular;  Laterality: Right;  hypogastric   EXCISION MASS HEAD N/A 01/06/2017   Procedure: EXCISION MASS FOREHEAD;  Surgeon: Thimmappa, Brinda, MD;  Location: [facility];  Service: Plastics;  Laterality: N/A;   GANGLION CYST EXCISION Left    INTERCOSTAL NERVE BLOCK  2005   KNEE ARTHROSCOPY Right 2016   MASS EXCISION N/A 01/25/2021   Procedure: EXCISION SUBCUTANEOUS VS SUBFASCIAL MASS TORSO 3CM;  Surgeon: Thimmappa, Brinda, MD;  Location: [facility];  Service: Plastics;  Laterality: N/A;    Review of systems negative except as noted in HPI / PMHx or noted below:  Review of Systems  Constitutional: Negative.   HENT: Negative.    Eyes: Negative.   Respiratory: Negative.    Cardiovascular: Negative.   Gastrointestinal: Negative.   Genitourinary: Negative.   Musculoskeletal: Negative.   Skin: Negative.   Neurological: Negative.    Endo/Heme/Allergies: Negative.   Psychiatric/Behavioral: Negative.      Objective:   Vitals:   05/18/21 0906  BP: 110/70  Pulse: 92  Resp: 18  Temp: 98.4 F (36.9 C)  SpO2: 98%   Height: 6' 1" (185.4 cm)  Weight: 266 lb (120.7 kg)   Physical Exam Constitutional:      Appearance: He is not diaphoretic.  HENT:     Head: Normocephalic.     Right Ear: Tympanic membrane, ear canal and external ear normal.     Left Ear: Tympanic membrane, ear canal and external ear normal.     Nose: Nose normal. No mucosal edema or rhinorrhea.     Mouth/Throat:     Pharynx: Uvula midline. No oropharyngeal exudate.  Eyes:     Conjunctiva/sclera: Conjunctivae normal.  Neck:     Thyroid: No thyromegaly.     Trachea: Trachea normal. No tracheal tenderness or tracheal deviation.  Cardiovascular:     Rate and Rhythm: Normal rate and regular rhythm.     Heart sounds: Normal heart sounds, S1 normal and S2 normal. No murmur heard. Pulmonary:     Effort: No respiratory distress.     Breath sounds: Normal breath sounds. No stridor. No wheezing or rales.  Lymphadenopathy:     Head:     Right side of head: No tonsillar adenopathy.     Left side of head: No tonsillar adenopathy.     Cervical: No cervical adenopathy.  Skin:    Findings: No erythema or rash.     Nails: There is no clubbing.  Neurological:     Mental Status: He is alert.    Diagnostics:    Spirometry was performed and demonstrated an FEV1 of 2.83 at 82 % of predicted.  Assessment and Plan:   1. Asthma, moderate persistent, well-controlled   2. Perennial allergic rhinitis   3. Seasonal allergic rhinitis due to pollen   4. Perennial allergic conjunctivitis of both eyes   5. Seasonal allergic conjunctivitis   6. Light tobacco smoker   7. Cannabis use, uncomplicated     1.  Perform Allergen avoidance measures as best as possible.  2.  Use nicotine substitutes to replace tobacco smoke exposure  3.  Continue to Treat and  prevent inflammation:   A.  Flonase 1 spray   each nostril twice a day  B.  Azelastine nasal spray 1 spray each nostril twice a day  C.  Montelukast 10 mg 1 tablet 1 time per day  D.  Breo 200 - 1 inhalation 1 time per day  E.  Immunotherapy  4. If needed:   A.  Cetirizine 10 mg 1 tablet 1-2 times a day  B.  Pataday - 1 drop each eye 1 time per day  C.  Pro-air HFA 2 puffs every 4-6 hours  5. Return to clinic in 6 months or earlier if problem  Brigg will continue to use immunotherapy and a collection of anti-inflammatory agents administered to his upper and lower respiratory tract to control his multiorgan atopic disease as noted above.  And he has a selection of agents that he can utilize should they be required also as noted above.  Assuming he does well with this plan I will see him back in this clinic in 6 months or earlier if there is a problem.   , MD Allergy / Immunology [facility]  

## 2021-05-19 ENCOUNTER — Encounter: Payer: Self-pay | Admitting: Allergy and Immunology

## 2021-05-19 ENCOUNTER — Telehealth: Payer: Self-pay | Admitting: *Deleted

## 2021-05-19 DIAGNOSIS — I480 Paroxysmal atrial fibrillation: Secondary | ICD-10-CM | POA: Diagnosis not present

## 2021-05-19 NOTE — Telephone Encounter (Signed)
PA has been submitted through [facility] Tracks for [place] and is currently suspended/pending. Confirmation # [id number] W.

## 2021-05-20 ENCOUNTER — Other Ambulatory Visit: Payer: Self-pay | Admitting: *Deleted

## 2021-05-20 DIAGNOSIS — I480 Paroxysmal atrial fibrillation: Secondary | ICD-10-CM | POA: Diagnosis not present

## 2021-05-21 ENCOUNTER — Ambulatory Visit (INDEPENDENT_AMBULATORY_CARE_PROVIDER_SITE_OTHER): Payer: Medicaid Other

## 2021-05-21 ENCOUNTER — Other Ambulatory Visit: Payer: Self-pay | Admitting: *Deleted

## 2021-05-21 DIAGNOSIS — I480 Paroxysmal atrial fibrillation: Secondary | ICD-10-CM | POA: Diagnosis not present

## 2021-05-21 DIAGNOSIS — J309 Allergic rhinitis, unspecified: Secondary | ICD-10-CM

## 2021-05-21 MED ORDER — BREO ELLIPTA 200-25 MCG/ACT IN AEPB: 1.0000 | INHALATION_SPRAY | Freq: Every day | RESPIRATORY_TRACT | 5 refills | Status: DC

## 2021-05-21 NOTE — Telephone Encounter (Signed)
PA was denied for [facility]. Currently waiting from fax reason to determine next best step.

## 2021-05-22 DIAGNOSIS — I480 Paroxysmal atrial fibrillation: Secondary | ICD-10-CM | POA: Diagnosis not present

## 2021-05-23 DIAGNOSIS — I480 Paroxysmal atrial fibrillation: Secondary | ICD-10-CM | POA: Diagnosis not present

## 2021-05-24 DIAGNOSIS — I480 Paroxysmal atrial fibrillation: Secondary | ICD-10-CM | POA: Diagnosis not present

## 2021-05-25 ENCOUNTER — Telehealth: Payer: Self-pay | Admitting: *Deleted

## 2021-05-25 DIAGNOSIS — I480 Paroxysmal atrial fibrillation: Secondary | ICD-10-CM | POA: Diagnosis not present

## 2021-05-25 NOTE — Telephone Encounter (Signed)
   [facility] Pre-operative Risk Assessment    Patient Name: Jason Stokes  DOB: 06-14-1961 MRN: [id number]  HEARTCARE STAFF:  - IMPORTANT!!!!!! Under Visit Info/Reason for Call, type in Other and utilize the format Clearance MM/DD/YY or Clearance TBD. Do not use dashes or single digits. - Please review there is not already an duplicate clearance open for this procedure. - If request is for dental extraction, please clarify the # of teeth to be extracted. - If the patient is currently at the dentist's office, call Pre-Op Callback Staff (MA/nurse) to input urgent request.  - If the patient is not currently in the dentist office, please route to the Pre-Op pool.  Request for surgical clearance:  What type of surgery is being performed?   SPINAL INJECTION  When is this surgery scheduled?  TBD  What type of clearance is required (medical clearance vs. Pharmacy clearance to hold med vs. Both)?  PHARMACY  Are there any medications that need to be held prior to surgery and how long?  ELIQUIS X'S 3-5 DAYS   Practice name and name of physician performing surgery?  [facility] / DR. BARTKO  What is the office phone number?  [phone]   7.   What is the office fax number?  [phone]  8.   Anesthesia type (None, local, MAC, general) ?     Jeanann Lewandowsky 05/25/2021, 10:34 AM  _________________________________________________________________   (provider comments below)

## 2021-05-25 NOTE — Telephone Encounter (Signed)
Clinical pharmacist to review Eliquis 

## 2021-05-25 NOTE — Telephone Encounter (Signed)
Patient with diagnosis of atrial fibrillation on Eliquis for anticoagulation.    Procedure: spinal injection Date of procedure: TBD   CHA2DS2-VASc Score = 4   This indicates a 4.8% annual risk of stroke. The patient's score is based upon: CHF History: 0 HTN History: 1 Diabetes History: 1 Stroke History: 2 Vascular Disease History: 0 Age Score: 0 Gender Score: 0   CrCl 94 Platelet count 190  Per office protocol, patient can hold Eliquis for 3 days prior to procedure.   Patient will not need bridging with Lovenox (enoxaparin) around procedure.

## 2021-05-26 DIAGNOSIS — I480 Paroxysmal atrial fibrillation: Secondary | ICD-10-CM | POA: Diagnosis not present

## 2021-05-26 NOTE — Telephone Encounter (Signed)
    Patient Name: Jason Stokes  DOB: Mar 01, 1961 MRN: [id number]  Primary Cardiologist: Will [name], MD  Chart reviewed as part of pre-operative protocol coverage. Patient has upcoming spinal injection planned and we were asked to give our recommendations for holding Eliquis. Per Pharmacy and office protocol, "Patient can hold Eliquis for 3 days prior to procedure. Patient will not need bridging with Lovenox (enoxaparin) around procedure."  I will route this recommendation to the requesting party via [facility] fax function and remove from pre-op pool.  Please call with questions.  Darreld Mclean, PA-C 05/26/2021, 8:04 AM

## 2021-05-27 DIAGNOSIS — I480 Paroxysmal atrial fibrillation: Secondary | ICD-10-CM | POA: Diagnosis not present

## 2021-05-28 DIAGNOSIS — I480 Paroxysmal atrial fibrillation: Secondary | ICD-10-CM | POA: Diagnosis not present

## 2021-05-29 DIAGNOSIS — I480 Paroxysmal atrial fibrillation: Secondary | ICD-10-CM | POA: Diagnosis not present

## 2021-05-30 DIAGNOSIS — I480 Paroxysmal atrial fibrillation: Secondary | ICD-10-CM | POA: Diagnosis not present

## 2021-05-31 DIAGNOSIS — I480 Paroxysmal atrial fibrillation: Secondary | ICD-10-CM | POA: Diagnosis not present

## 2021-06-01 DIAGNOSIS — I480 Paroxysmal atrial fibrillation: Secondary | ICD-10-CM | POA: Diagnosis not present

## 2021-06-01 DIAGNOSIS — M48062 Spinal stenosis, lumbar region with neurogenic claudication: Secondary | ICD-10-CM | POA: Diagnosis not present

## 2021-06-02 ENCOUNTER — Other Ambulatory Visit: Payer: Self-pay

## 2021-06-02 ENCOUNTER — Ambulatory Visit (INDEPENDENT_AMBULATORY_CARE_PROVIDER_SITE_OTHER): Payer: Medicaid Other | Admitting: Vascular Surgery

## 2021-06-02 ENCOUNTER — Encounter: Payer: Self-pay | Admitting: Vascular Surgery

## 2021-06-02 VITALS — BP 136/88 | HR 64 | Temp 99.4°F | Resp 18 | Ht 72.0 in | Wt 264.7 lb

## 2021-06-02 DIAGNOSIS — I480 Paroxysmal atrial fibrillation: Secondary | ICD-10-CM | POA: Diagnosis not present

## 2021-06-02 DIAGNOSIS — H11442 Conjunctival cysts, left eye: Secondary | ICD-10-CM | POA: Diagnosis not present

## 2021-06-02 DIAGNOSIS — H209 Unspecified iridocyclitis: Secondary | ICD-10-CM | POA: Diagnosis not present

## 2021-06-02 DIAGNOSIS — I872 Venous insufficiency (chronic) (peripheral): Secondary | ICD-10-CM

## 2021-06-02 NOTE — Progress Notes (Signed)
REASON FOR VISIT:   Follow-up of chronic venous insufficiency.  MEDICAL ISSUES:   Upright form this patient has superficial venous reflux bilaterall however based on his previous duplex this was fairly limited.  His symptoms were more significant on the left side and the reflux is mostly just in the proximal thigh and saphenofemoral junction.  His symptoms have improved with conservative treatment.  We have again discussed the importance of intermittent leg elevation and the proper positioning for this.  I encouraged him to continue to wear his compression stockings.  Again we discussed the importance of exercise specifically walking and water aerobics.  Given that his symptoms have improved at this point I would not recommend laser ablation of the left great saphenous vein.  If his symptoms progress I think he would be a good candidate as the vein is significantly dilated on the left and he does have reflux in the proximal vein which appears to be significant.  I to   HPI:   Jason Stokes is a pleasant 60 y.o. male who was seen by Karoline Caldwell, PA on 02/24/2021 with bilateral lower extremity swelling.  He has no history of DVT and no previous venous procedures.  He was having some aching pain associated with the swelling which was aggravated by sitting and standing and relieved with elevation.  He was fitted for thigh-high compression stockings encouraged to elevate his legs.  He comes in for 73-monthfollow-up visit.  He states that the compression stockings do help his leg significantly.  In addition leg elevation helps.  Thus his symptoms have improved significantly.  I do not get any history of claudication, rest pain, or nonhealing ulcers.   Past Medical History:  Diagnosis Date   Anxiety    Bilateral carpal tunnel syndrome 01/10/2018   Bipolar disorder (HCC)    Chronic lower back pain    Depression    Dysrhythmia    a-fib   GERD (gastroesophageal reflux disease)    History of  alcohol abuse    History of nuclear stress test    Myoview 10/16: EF 50%, diaphragmatic attenuation, no ischemia, low risk   Hypertension    Migraine 2012-2014   Moderate persistent asthma with acute exacerbation 05/02/2018   PAF (paroxysmal atrial fibrillation) [place] 03/27/2015   a. Myoview neg for ischemia >> Flecainide started 10/16 >> FU ETT    Schizophrenia ([place]    Sciatica neuralgia    Small vessel disease (HCC)    Right basal ganglia stroke   [facility]roke [facility] "between 2012-2014"   residual "AF" (09/22/2015)    Family History  Problem Relation Age of Onset   Heart disease Father    Schizophrenia Sister     SOCIAL HISTORY: Social History   Tobacco Use   Smoking status: Every Day    Years: 29.00    Types: Cigarettes   Smokeless tobacco: Never   Tobacco comments:    1 cigarettes every day  06-02-2021  Substance Use Topics   Alcohol use: Yes    Alcohol/week: 2.0 standard drinks    Types: 2 Standard drinks or equivalent per week    Comment: 283med drinks    Allergies  Allergen Reactions   Lisinopril Anaphylaxis    Swelling of lips and tongue.    Current Outpatient Medications  Medication Sig Dispense Refill   Accu-Chek Softclix Lancets lancets Use as instructed 3 times daily before meals 100 each 12   ADVAIR DISKUS 500-50 MCG/ACT AEPB Inhale 1 puff  into the lungs daily.     albuterol (PROAIR HFA) 108 (90 Base) MCG/ACT inhaler Inhale 2 puffs into the lungs every 4 (four) hours as needed for wheezing or shortness of breath. 18 g 2   ALPHAGAN P 0.1 % SOLN Place 1 drop into the left eye 2 (two) times daily.     atorvastatin (LIPITOR) 40 MG tablet Take 1 tablet (40 mg total) by mouth daily. 30 tablet 6   azelastine (ASTELIN) 0.1 % nasal spray Place 2 sprays into both nostrils 2 (two) times daily. Use in each nostril as directed 30 mL 5   Azelastine HCl 0.15 % SOLN Place 1 spray into both nostrils 2 (two) times daily. 30 mL 5   baclofen (LIORESAL) 10 MG tablet Take 10 mg  by mouth 3 (three) times daily as needed for muscle pain.     Blood Glucose Monitoring Suppl (ACCU-CHEK AVIVA PLUS) w/Device KIT Use 3 times daily before meals 1 kit 0   BREO ELLIPTA 200-25 MCG/ACT AEPB Inhale 1 puff into the lungs daily. 28 each 5   carvedilol (COREG) 25 MG tablet Take 1 tablet (25 mg total) by mouth 2 (two) times daily. 180 tablet 1   cetirizine (ZYRTEC) 10 MG tablet Take 1 tablet (10 mg total) by mouth daily. 60 tablet 5   CYMBALTA 30 MG capsule Take 30 mg by mouth at bedtime.     diltiazem (CARDIZEM CD) 180 MG 24 hr capsule TAKE 1 CAPSULE BY MOUTH EVERY DAY 90 capsule 1   dronedarone (MULTAQ) 400 MG tablet Take 1 tablet (400 mg total) by mouth 2 (two) times daily with a meal. 180 tablet 3   ELIQUIS 5 MG TABS tablet TAKE 1 TABLET BY MOUTH TWICE A DAY 60 tablet 6   fluticasone (FLONASE) 50 MCG/ACT nasal spray Place 1 spray into both nostrils 2 (two) times daily. 16 g 5   fluticasone furoate-vilanterol (BREO ELLIPTA) 200-25 MCG/ACT AEPB Inhale 1 puff into the lungs daily. 60 each 5   folic acid (FOLVITE) 1 MG tablet Take 1 mg by mouth daily.     furosemide (LASIX) 20 MG tablet Take 1 tablet daily, as needed, for swelling 30 tablet 1   GARLIC PO Take 1 tablet by mouth daily as needed (To build immune system up).     glucose blood (ACCU-CHEK AVIVA PLUS) test strip Use as instructed 3 times daily before meals 100 each 12   hydrALAZINE (APRESOLINE) 100 MG tablet TAKE 1 TABLET (100 MG TOTAL) BY MOUTH 3 (THREE) TIMES DAILY. 270 tablet 2   methocarbamol (ROBAXIN) 500 MG tablet Take 1 tablet (500 mg total) by mouth every 8 (eight) hours as needed for muscle spasms. 30 tablet 1   methotrexate (RHEUMATREX) 2.5 MG tablet Take 2.5 mg by mouth daily.     Misc. Devices MISC Back brace. Diagnosis chronic back pain 1 each 0   Misc. Devices MISC Left knee brace. Diagnosis L knee pain 1 each 0   montelukast (SINGULAIR) 10 MG tablet Take 1 tablet (10 mg total) by mouth at bedtime. 30 tablet 5    naloxone (NARCAN) nasal spray 4 mg/0.1 mL Place 1 spray into the nose daily as needed for opioid reversal.     Olopatadine HCl (PATADAY) 0.2 % SOLN Use one drop in each eye once daily as needed. 2.5 mL 5   omeprazole (PRILOSEC) 20 MG capsule TAKE 1 CAPSULE BY MOUTH EVERY DAY 90 capsule 2   Oxycodone HCl 10 MG TABS Take 1  tablet (10 mg total) by mouth 4 (four) times daily as needed (Pain). 15 tablet 0   prazosin (MINIPRESS) 1 MG capsule Take 1 mg by mouth at bedtime.  2   prednisoLONE acetate (PRED FORTE) 1 % ophthalmic suspension Place 1 drop into the left eye 6 (six) times daily.     predniSONE (DELTASONE) 20 MG tablet Take 20 mg by mouth 3 (three) times daily.     pregabalin (LYRICA) 75 MG capsule Take 1 capsule by mouth 4 (four) times daily as needed (pain).     SIMBRINZA 1-0.2 % SUSP Place 1 drop into both eyes daily as needed for allergies.     Vitamin D3 (VITAMIN D) 25 MCG tablet Take 1,000 Units by mouth daily.     No current facility-administered medications for this visit.    REVIEW OF SYSTEMS:  [X]  denotes positive finding, [ ]  denotes negative finding Cardiac  Comments:  Chest pain or chest pressure:    Shortness of breath upon exertion:    Short of breath when lying flat:    Irregular heart rhythm:        Vascular    Pain in calf, thigh, or hip brought on by ambulation:    Pain in feet at night that wakes you up from your sleep:     Blood clot in your veins:    Leg swelling:  x       Pulmonary    Oxygen at home:    Productive cough:     Wheezing:         Neurologic    Sudden weakness in arms or legs:     Sudden numbness in arms or legs:     Sudden onset of difficulty speaking or slurred speech:    Temporary loss of vision in one eye:     Problems with dizziness:         Gastrointestinal    Blood in stool:     Vomited blood:         Genitourinary    Burning when urinating:     Blood in urine:        Psychiatric    Major depression:         Hematologic     Bleeding problems:    Problems with blood clotting too easily:        Skin    Rashes or ulcers:        Constitutional    Fever or chills:     PHYSICAL EXAM:   Vitals:   06/02/21 1341  BP: 136/88  Pulse: 64  Resp: 18  Temp: 99.4 F (37.4 C)  TempSrc: Temporal  SpO2: 100%  Weight: 264 lb 11.2 oz (120.1 kg)  Height: 6' (1.829 m)    GENERAL: The patient is a well-nourished male, in no acute distress. The vital signs are documented above. CARDIAC: There is a regular rate and rhythm.  VASCULAR: I do not detect carotid bruits. He has palpable posterior tibial pulses. I looked at his left great saphenous vein myself with the SonoSite and the vein was dilated up to 6 mm in several areas.  However I did not see significant reflux in the distal thigh. PULMONARY: There is good air exchange bilaterally without wheezing or rales. ABDOMEN: Soft and non-tender with normal pitched bowel sounds.  MUSCULOSKELETAL: There are no major deformities or cyanosis. NEUROLOGIC: No focal weakness or paresthesias are detected. SKIN: There are no ulcers or rashes noted. PSYCHIATRIC: The patient  has a normal affect.  DATA:    VENOUS DUPLEX: I have independently interpreted the venous duplex scan that was done on 02/24/2021.  On the right side, there was no evidence of DVT.  There was no deep venous reflux.  There was minimal superficial venous reflux at the saphenofemoral junction and in the distal thigh and knee on the right.  The vein was not dilated.  The small saphenous vein had a short segment of reflux but again the vein was not dilated.   On the left side, there was no evidence of DVT.  There was no deep venous reflux.  There was superficial venous reflux in the left great saphenous vein from the saphenofemoral junction to the proximal thigh.  The vein was up to 4.5 mm in diameter.  There was some chronic thrombus in the great saphenous vein at the knee.     Deitra [facility] [phone]

## 2021-06-03 DIAGNOSIS — I480 Paroxysmal atrial fibrillation: Secondary | ICD-10-CM | POA: Diagnosis not present

## 2021-06-04 ENCOUNTER — Ambulatory Visit (INDEPENDENT_AMBULATORY_CARE_PROVIDER_SITE_OTHER): Payer: Medicaid Other

## 2021-06-04 DIAGNOSIS — J309 Allergic rhinitis, unspecified: Secondary | ICD-10-CM

## 2021-06-04 DIAGNOSIS — I480 Paroxysmal atrial fibrillation: Secondary | ICD-10-CM | POA: Diagnosis not present

## 2021-06-05 DIAGNOSIS — I480 Paroxysmal atrial fibrillation: Secondary | ICD-10-CM | POA: Diagnosis not present

## 2021-06-06 DIAGNOSIS — I480 Paroxysmal atrial fibrillation: Secondary | ICD-10-CM | POA: Diagnosis not present

## 2021-06-07 ENCOUNTER — Other Ambulatory Visit: Payer: Self-pay | Admitting: Physician Assistant

## 2021-06-07 DIAGNOSIS — I480 Paroxysmal atrial fibrillation: Secondary | ICD-10-CM | POA: Diagnosis not present

## 2021-06-07 DIAGNOSIS — I10 Essential (primary) hypertension: Secondary | ICD-10-CM

## 2021-06-08 DIAGNOSIS — I480 Paroxysmal atrial fibrillation: Secondary | ICD-10-CM | POA: Diagnosis not present

## 2021-06-09 DIAGNOSIS — I480 Paroxysmal atrial fibrillation: Secondary | ICD-10-CM | POA: Diagnosis not present

## 2021-06-10 DIAGNOSIS — I480 Paroxysmal atrial fibrillation: Secondary | ICD-10-CM | POA: Diagnosis not present

## 2021-06-11 DIAGNOSIS — I480 Paroxysmal atrial fibrillation: Secondary | ICD-10-CM | POA: Diagnosis not present

## 2021-06-12 DIAGNOSIS — I480 Paroxysmal atrial fibrillation: Secondary | ICD-10-CM | POA: Diagnosis not present

## 2021-06-13 DIAGNOSIS — I480 Paroxysmal atrial fibrillation: Secondary | ICD-10-CM | POA: Diagnosis not present

## 2021-06-14 ENCOUNTER — Telehealth: Payer: Self-pay | Admitting: *Deleted

## 2021-06-14 DIAGNOSIS — I480 Paroxysmal atrial fibrillation: Secondary | ICD-10-CM | POA: Diagnosis not present

## 2021-06-14 NOTE — Telephone Encounter (Signed)
Left VM  Previously cleared this month with eliquis hold.

## 2021-06-14 NOTE — Telephone Encounter (Signed)
Our office received a clearance request today for spine injection. Our office faxed over a clearance on 05/26/21 for spine injection with recommendations for Eliquis. I called Dr. Lauris Poag office and left a message for Cloyde Reams to please call our office back. Need to clarify is this for a new injection or is this still from the request earlier this month 05/2021. Our office faxed over a clearance for spine injection 05/26/21. If new injection I will get info to pre op pool. If not and Dr. Lauris Poag office states they did not receive 05/26/21 clearance, I will be happy to re-fax clearance. Will await clarification.

## 2021-06-14 NOTE — Telephone Encounter (Signed)
Molly from Dr. Lauris Poag office called back and confirmed the fax today is indeed a new clearance request.     Pre-operative Risk Assessment    Patient Name: Jason Stokes  DOB: November 04, 1960 MRN: [id number]      Request for Surgical Clearance   Procedure:   SPINAL INJECTION  Date of Surgery: Clearance TBD                                 Surgeon:  DR. Marlaine Hind Surgeon's Group or Practice Name:  [facility] Phone number:  [phone] Fax number:  [phone]   Type of Clearance Requested: - Medical  - Pharmacy:  Hold Apixaban (Eliquis) x 3 DAYS PRIOR TO INJECTION   Type of Anesthesia:   Not Indicated   Additional requests/questions:   Jiles Prows   06/14/2021, 2:38 PM

## 2021-06-15 ENCOUNTER — Ambulatory Visit (INDEPENDENT_AMBULATORY_CARE_PROVIDER_SITE_OTHER): Payer: Medicaid Other | Admitting: *Deleted

## 2021-06-15 DIAGNOSIS — J309 Allergic rhinitis, unspecified: Secondary | ICD-10-CM | POA: Diagnosis not present

## 2021-06-15 DIAGNOSIS — I480 Paroxysmal atrial fibrillation: Secondary | ICD-10-CM | POA: Diagnosis not present

## 2021-06-16 DIAGNOSIS — I480 Paroxysmal atrial fibrillation: Secondary | ICD-10-CM | POA: Diagnosis not present

## 2021-06-17 DIAGNOSIS — H209 Unspecified iridocyclitis: Secondary | ICD-10-CM | POA: Diagnosis not present

## 2021-06-17 DIAGNOSIS — I480 Paroxysmal atrial fibrillation: Secondary | ICD-10-CM | POA: Diagnosis not present

## 2021-06-18 DIAGNOSIS — H0510 Unspecified chronic inflammatory disorders of orbit: Secondary | ICD-10-CM | POA: Diagnosis not present

## 2021-06-18 DIAGNOSIS — I480 Paroxysmal atrial fibrillation: Secondary | ICD-10-CM | POA: Diagnosis not present

## 2021-06-18 DIAGNOSIS — H44113 Panuveitis, bilateral: Secondary | ICD-10-CM | POA: Diagnosis not present

## 2021-06-19 DIAGNOSIS — I480 Paroxysmal atrial fibrillation: Secondary | ICD-10-CM | POA: Diagnosis not present

## 2021-06-20 DIAGNOSIS — I480 Paroxysmal atrial fibrillation: Secondary | ICD-10-CM | POA: Diagnosis not present

## 2021-06-21 DIAGNOSIS — I480 Paroxysmal atrial fibrillation: Secondary | ICD-10-CM | POA: Diagnosis not present

## 2021-06-21 NOTE — Telephone Encounter (Signed)
    Chart reviewed as part of pre-operative protocol coverage. Patient has upcoming spinal injection planned and we were asked to give our recommendations for holding Eliquis. Per Pharmacy and office protocol, "Patient can hold Eliquis for 3 days prior to procedure. Patient will not need bridging with Lovenox (enoxaparin) around procedure."   I will route this recommendation to the requesting party via Epic fax function and remove from pre-op pool

## 2021-06-21 NOTE — Telephone Encounter (Signed)
I did talk with pt and no cardiac changes from first injection.  First injection did not help.

## 2021-06-22 DIAGNOSIS — I480 Paroxysmal atrial fibrillation: Secondary | ICD-10-CM | POA: Diagnosis not present

## 2021-06-23 DIAGNOSIS — I480 Paroxysmal atrial fibrillation: Secondary | ICD-10-CM | POA: Diagnosis not present

## 2021-06-24 DIAGNOSIS — I480 Paroxysmal atrial fibrillation: Secondary | ICD-10-CM | POA: Diagnosis not present

## 2021-06-25 ENCOUNTER — Ambulatory Visit (INDEPENDENT_AMBULATORY_CARE_PROVIDER_SITE_OTHER): Payer: Medicaid Other

## 2021-06-25 DIAGNOSIS — I480 Paroxysmal atrial fibrillation: Secondary | ICD-10-CM | POA: Diagnosis not present

## 2021-06-25 DIAGNOSIS — J309 Allergic rhinitis, unspecified: Secondary | ICD-10-CM | POA: Diagnosis not present

## 2021-06-26 DIAGNOSIS — I480 Paroxysmal atrial fibrillation: Secondary | ICD-10-CM | POA: Diagnosis not present

## 2021-06-27 DIAGNOSIS — I480 Paroxysmal atrial fibrillation: Secondary | ICD-10-CM | POA: Diagnosis not present

## 2021-06-28 DIAGNOSIS — I480 Paroxysmal atrial fibrillation: Secondary | ICD-10-CM | POA: Diagnosis not present

## 2021-06-29 DIAGNOSIS — I480 Paroxysmal atrial fibrillation: Secondary | ICD-10-CM | POA: Diagnosis not present

## 2021-06-29 NOTE — Progress Notes (Signed)
EXP 07/01/22

## 2021-06-30 DIAGNOSIS — I480 Paroxysmal atrial fibrillation: Secondary | ICD-10-CM | POA: Diagnosis not present

## 2021-07-01 DIAGNOSIS — I480 Paroxysmal atrial fibrillation: Secondary | ICD-10-CM | POA: Diagnosis not present

## 2021-07-01 DIAGNOSIS — J3081 Allergic rhinitis due to animal (cat) (dog) hair and dander: Secondary | ICD-10-CM | POA: Diagnosis not present

## 2021-07-02 DIAGNOSIS — J3089 Other allergic rhinitis: Secondary | ICD-10-CM | POA: Diagnosis not present

## 2021-07-02 DIAGNOSIS — I480 Paroxysmal atrial fibrillation: Secondary | ICD-10-CM | POA: Diagnosis not present

## 2021-07-03 DIAGNOSIS — I480 Paroxysmal atrial fibrillation: Secondary | ICD-10-CM | POA: Diagnosis not present

## 2021-07-04 DIAGNOSIS — I480 Paroxysmal atrial fibrillation: Secondary | ICD-10-CM | POA: Diagnosis not present

## 2021-07-05 DIAGNOSIS — I480 Paroxysmal atrial fibrillation: Secondary | ICD-10-CM | POA: Diagnosis not present

## 2021-07-06 DIAGNOSIS — I480 Paroxysmal atrial fibrillation: Secondary | ICD-10-CM | POA: Diagnosis not present

## 2021-07-07 DIAGNOSIS — I480 Paroxysmal atrial fibrillation: Secondary | ICD-10-CM | POA: Diagnosis not present

## 2021-07-08 ENCOUNTER — Ambulatory Visit (INDEPENDENT_AMBULATORY_CARE_PROVIDER_SITE_OTHER): Payer: Medicaid Other

## 2021-07-08 DIAGNOSIS — J309 Allergic rhinitis, unspecified: Secondary | ICD-10-CM | POA: Diagnosis not present

## 2021-07-08 DIAGNOSIS — I480 Paroxysmal atrial fibrillation: Secondary | ICD-10-CM | POA: Diagnosis not present

## 2021-07-09 DIAGNOSIS — I480 Paroxysmal atrial fibrillation: Secondary | ICD-10-CM | POA: Diagnosis not present

## 2021-07-10 DIAGNOSIS — I480 Paroxysmal atrial fibrillation: Secondary | ICD-10-CM | POA: Diagnosis not present

## 2021-07-11 DIAGNOSIS — I480 Paroxysmal atrial fibrillation: Secondary | ICD-10-CM | POA: Diagnosis not present

## 2021-07-12 DIAGNOSIS — I480 Paroxysmal atrial fibrillation: Secondary | ICD-10-CM | POA: Diagnosis not present

## 2021-07-13 DIAGNOSIS — I480 Paroxysmal atrial fibrillation: Secondary | ICD-10-CM | POA: Diagnosis not present

## 2021-07-14 DIAGNOSIS — H44113 Panuveitis, bilateral: Secondary | ICD-10-CM | POA: Diagnosis not present

## 2021-07-14 DIAGNOSIS — I480 Paroxysmal atrial fibrillation: Secondary | ICD-10-CM | POA: Diagnosis not present

## 2021-07-15 ENCOUNTER — Ambulatory Visit (INDEPENDENT_AMBULATORY_CARE_PROVIDER_SITE_OTHER): Payer: Medicaid Other | Admitting: *Deleted

## 2021-07-15 ENCOUNTER — Encounter: Payer: Self-pay | Admitting: Physician Assistant

## 2021-07-15 ENCOUNTER — Ambulatory Visit (INDEPENDENT_AMBULATORY_CARE_PROVIDER_SITE_OTHER): Payer: Medicaid Other | Admitting: Physician Assistant

## 2021-07-15 DIAGNOSIS — I480 Paroxysmal atrial fibrillation: Secondary | ICD-10-CM | POA: Diagnosis not present

## 2021-07-15 DIAGNOSIS — M1712 Unilateral primary osteoarthritis, left knee: Secondary | ICD-10-CM

## 2021-07-15 DIAGNOSIS — J309 Allergic rhinitis, unspecified: Secondary | ICD-10-CM

## 2021-07-15 MED ORDER — LIDOCAINE HCL 1 % IJ SOLN
5.0000 mL | INTRAMUSCULAR | Status: AC | PRN
  Administered 2021-07-15: 11:00:00 5 mL

## 2021-07-15 MED ORDER — METHYLPREDNISOLONE ACETATE 40 MG/ML IJ SUSP
80.0000 mg | INTRAMUSCULAR | Status: AC | PRN
  Administered 2021-07-15: 11:00:00 80 mg via INTRA_ARTICULAR

## 2021-07-15 NOTE — Progress Notes (Signed)
Office Visit Note   [name]           Date of Birth: 1960-07-22           MRN: [id number] Visit Date: 07/15/2021              Requested by: Charlott Rakes, MD [place],  [place] [address] PCP: Charlott Rakes, MD  Chief Complaint  Patient presents with   Right Knee - Edema      HPI: Patient is a pleasant 60 year old gentleman who is a patient of Dr. Trevor Mace.  He has a history of right knee arthritis and periodically is seen to have his knee aspirated and a steroid injection.  He was told that he could he could have another aspiration and injection this year.  He just returned from vacation in [place] and feels like he is accumulated some fluid in his knee.  No fever chills  Assessment & Plan: Visit Diagnoses:  1. Unilateral primary osteoarthritis, left knee     Plan: Patient was injected with steroid 40 cc of clear yellow fluid was aspirated.  Tolerated procedure well.  He will follow-up with Dr. Ninfa Linden as needed  Follow-Up Instructions: No follow-ups on file.   Ortho Exam  Patient is alert, oriented, no adenopathy, well-dressed, normal affect, normal respiratory effort. Right knee no redness no cellulitis he does have some effusion.  Consistent with previous no signs of infection he has crepitus with range of motion  Imaging: No results found. No images are attached to the encounter.  Labs: Lab Results  Component Value Date   HGBA1C 6.4 04/26/2021   HGBA1C 9.0 (A) 02/10/2021   HGBA1C 5.7 04/03/2017   REPTSTATUS 12/07/2020 FINAL 12/04/2020   GRAMSTAIN  12/04/2020    RARE WBC PRESENT,BOTH PMN AND MONONUCLEAR RARE GRAM POSITIVE COCCI    CULT  12/04/2020    RARE STREPTOCOCCUS AGALACTIAE TESTING AGAINST S. AGALACTIAE NOT ROUTINELY PERFORMED DUE TO PREDICTABILITY OF AMP/PEN/VAN SUSCEPTIBILITY. WITHIN MIXED ORGANISMS Performed at [facility], [place] [address], [place], [place] [address]      Lab Results  Component  Value Date   ALBUMIN 4.3 01/03/2020   ALBUMIN 4.2 08/14/2018   ALBUMIN 4.2 05/07/2018    Lab Results  Component Value Date   MG 2.2 01/30/2017   No results found for: VD25OH  No results found for: PREALBUMIN CBC EXTENDED Latest Ref Rng & Units 11/23/2020 08/19/2020 01/03/2020  WBC 3.4 - 10.8 x10E3/uL 8.1 - 4.8  RBC 4.14 - 5.80 x10E6/uL 5.19 - 5.75  HGB 13.0 - 17.7 g/dL 13.0 15.3 14.2  HCT 37.5 - 51.0 % 39.4 45.0 45.2  PLT 150 - 450 x10E3/uL 190 - 215  NEUTROABS 1.7 - 7.7 K/uL - - -  LYMPHSABS 0.7 - 4.0 K/uL - - -     There is no height or weight on file to calculate BMI.  Orders:  No orders of the defined types were placed in this encounter.  No orders of the defined types were placed in this encounter.    Procedures: Large Joint Inj on 07/15/2021 10:49 AM Indications: pain and diagnostic evaluation Details: 25 G 1.5 in needle, anterolateral approach  Arthrogram: No  Medications: 80 mg methylPREDNISolone acetate 40 MG/ML; 5 mL lidocaine 1 % Aspirate: 40 mL yellow Outcome: tolerated well, no immediate complications Procedure, treatment alternatives, risks and benefits explained, specific risks discussed. Consent was given by the patient.     Clinical Data: No additional findings.  ROS:  All other systems negative, except as noted in the HPI. Review of Systems  Objective: Vital Signs: There were no vitals taken for this visit.  Specialty Comments:  No specialty comments available.  PMFS History: Patient Active Problem List   Diagnosis Date Noted   Secondary hypercoagulable state ([place]) 03/23/2021   Type 2 diabetes mellitus with hyperglycemia, without long-term current use of insulin ([place]) 02/10/2021   Neck pain on left side 03/06/2019   Musculoskeletal chest pain 07/24/2018   Asthma, mild intermittent 05/02/2018   Allergic rhinitis caused by mold 05/02/2018   Tobacco use 05/02/2018   Bilateral carpal tunnel syndrome 01/10/2018   Stroke ([place])     Schizophrenia ([place])    Migraine    History of nuclear stress test    History of alcohol abuse    GERD (gastroesophageal reflux disease)    Dysrhythmia    Chronic lower back pain    Anxiety    Arthritis of knee, right 04/12/2017   Bipolar disorder ([place]) 04/03/2017   Lipoma of forehead 09/22/2016   Trigger ring finger of right hand 06/22/2016   Paroxysmal atrial fibrillation (HCC)    AF (atrial fibrillation) ([place]) 12/10/2015   Eczema 07/10/2015   History of CVA (cerebrovascular accident) 05/04/2015   Tear of medial meniscus of right knee 03/18/2015   Chronic pain of right knee 03/12/2015   Other and unspecified hyperlipidemia 11/25/2013   Nasal congestion 11/25/2013   Sinusitis, chronic 05/09/2013   Chronic low back pain 10/12/2012   Lumbar radiculopathy 10/12/2012   Hypertension 10/12/2012   Depression 10/12/2012   Insomnia 10/12/2012   Past Medical History:  Diagnosis Date   Anxiety    Bilateral carpal tunnel syndrome 01/10/2018   Bipolar disorder ([place])    Chronic lower back pain    Depression    Dysrhythmia    a-fib   GERD (gastroesophageal reflux disease)    History of alcohol abuse    History of nuclear stress test    Myoview 10/16: EF 50%, diaphragmatic attenuation, no ischemia, low risk   Hypertension    Migraine 2012-2014   Moderate persistent asthma with acute exacerbation 05/02/2018   PAF (paroxysmal atrial fibrillation) ([place]) 03/27/2015   a. Myoview neg for ischemia >> Flecainide started 10/16 >> FU ETT    Schizophrenia ([place])    Sciatica neuralgia    Small vessel disease (HCC)    Right basal ganglia stroke   Stroke [facility]) "between 2012-2014"   residual "AF" (09/22/2015)    Family History  Problem Relation Age of Onset   Heart disease Father    Schizophrenia Sister     Past Surgical History:  Procedure Laterality Date   ATRIAL FIBRILLATION ABLATION  09/22/2015   CYST EXCISION  1996-97   surgery back of head    ELECTROPHYSIOLOGIC STUDY N/A 09/22/2015    Procedure: Atrial Fibrillation Ablation;  Surgeon: Will [name], MD;  Location: [facility];  Service: Cardiovascular;  Laterality: N/A;   ELECTROPHYSIOLOGIC STUDY N/A 12/10/2015   Procedure: Atrial Fibrillation Ablation;  Surgeon: Will [name], MD;  Location: [facility];  Service: Cardiovascular;  Laterality: N/A;   ELECTROPHYSIOLOGIC STUDY N/A 12/11/2015   Procedure: Cardioversion;  Surgeon: Will [name], MD;  Location: [facility];  Service: Cardiovascular;  Laterality: N/A;   EMBOLIZATION Right 08/19/2020   Procedure: EMBOLIZATION;  Surgeon: Cherre Robins, MD;  Location: [facility];  Service: Cardiovascular;  Laterality: Right;  hypogastric   EXCISION MASS  HEAD N/A 01/06/2017   Procedure: EXCISION MASS FOREHEAD;  Surgeon: Irene Limbo, MD;  Location: [facility];  Service: Plastics;  Laterality: N/A;   GANGLION CYST EXCISION Left    INTERCOSTAL NERVE BLOCK  2005   KNEE ARTHROSCOPY Right 2016   MASS EXCISION N/A 01/25/2021   Procedure: EXCISION SUBCUTANEOUS VS SUBFASCIAL MASS TORSO 3CM;  Surgeon: Irene Limbo, MD;  Location: [facility];  Service: Plastics;  Laterality: N/A;   Social History   Occupational History    Comment: disabled  Tobacco Use   Smoking status: Every Day    Years: 29.00    Types: Cigarettes   Smokeless tobacco: Never   Tobacco comments:    1 cigarettes every day  06-02-2021  Vaping Use   Vaping Use: Never used  Substance and Sexual Activity   Alcohol use: Yes    Alcohol/week: 2.0 standard drinks    Types: 2 Standard drinks or equivalent per week    Comment: 54mixed drinks   Drug use: Yes    Types: Marijuana    Comment: smoked marijuana 01-02-17 for pain   Sexual activity: Not Currently

## 2021-07-16 DIAGNOSIS — I480 Paroxysmal atrial fibrillation: Secondary | ICD-10-CM | POA: Diagnosis not present

## 2021-07-17 DIAGNOSIS — I480 Paroxysmal atrial fibrillation: Secondary | ICD-10-CM | POA: Diagnosis not present

## 2021-07-18 DIAGNOSIS — I480 Paroxysmal atrial fibrillation: Secondary | ICD-10-CM | POA: Diagnosis not present

## 2021-07-19 DIAGNOSIS — I480 Paroxysmal atrial fibrillation: Secondary | ICD-10-CM | POA: Diagnosis not present

## 2021-07-20 DIAGNOSIS — I480 Paroxysmal atrial fibrillation: Secondary | ICD-10-CM | POA: Diagnosis not present

## 2021-07-21 DIAGNOSIS — I480 Paroxysmal atrial fibrillation: Secondary | ICD-10-CM | POA: Diagnosis not present

## 2021-07-22 ENCOUNTER — Telehealth: Payer: Self-pay | Admitting: Family Medicine

## 2021-07-22 ENCOUNTER — Ambulatory Visit: Payer: Self-pay

## 2021-07-22 DIAGNOSIS — I480 Paroxysmal atrial fibrillation: Secondary | ICD-10-CM | POA: Diagnosis not present
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
## 2021-07-23 ENCOUNTER — Ambulatory Visit (INDEPENDENT_AMBULATORY_CARE_PROVIDER_SITE_OTHER): Payer: Medicaid Other | Admitting: *Deleted

## 2021-07-23 DIAGNOSIS — J309 Allergic rhinitis, unspecified: Secondary | ICD-10-CM | POA: Diagnosis not present

## 2021-07-23 DIAGNOSIS — I480 Paroxysmal atrial fibrillation: Secondary | ICD-10-CM | POA: Diagnosis not present

## 2021-07-24 DIAGNOSIS — I480 Paroxysmal atrial fibrillation: Secondary | ICD-10-CM | POA: Diagnosis not present

## 2021-07-25 DIAGNOSIS — I480 Paroxysmal atrial fibrillation: Secondary | ICD-10-CM | POA: Diagnosis not present

## 2021-07-26 DIAGNOSIS — I480 Paroxysmal atrial fibrillation: Secondary | ICD-10-CM | POA: Diagnosis not present

## 2021-07-27 DIAGNOSIS — I480 Paroxysmal atrial fibrillation: Secondary | ICD-10-CM | POA: Diagnosis not present

## 2021-07-28 DIAGNOSIS — H44113 Panuveitis, bilateral: Secondary | ICD-10-CM | POA: Diagnosis not present

## 2021-07-28 DIAGNOSIS — I480 Paroxysmal atrial fibrillation: Secondary | ICD-10-CM | POA: Diagnosis not present

## 2021-07-28 DIAGNOSIS — H0510 Unspecified chronic inflammatory disorders of orbit: Secondary | ICD-10-CM | POA: Diagnosis not present

## 2021-07-29 ENCOUNTER — Ambulatory Visit (INDEPENDENT_AMBULATORY_CARE_PROVIDER_SITE_OTHER): Payer: Medicaid Other

## 2021-07-29 DIAGNOSIS — J309 Allergic rhinitis, unspecified: Secondary | ICD-10-CM | POA: Diagnosis not present

## 2021-07-29 DIAGNOSIS — I480 Paroxysmal atrial fibrillation: Secondary | ICD-10-CM | POA: Diagnosis not present

## 2021-07-30 DIAGNOSIS — H44113 Panuveitis, bilateral: Secondary | ICD-10-CM | POA: Diagnosis not present

## 2021-07-30 DIAGNOSIS — I480 Paroxysmal atrial fibrillation: Secondary | ICD-10-CM | POA: Diagnosis not present

## 2021-07-31 DIAGNOSIS — I480 Paroxysmal atrial fibrillation: Secondary | ICD-10-CM | POA: Diagnosis not present

## 2021-08-01 DIAGNOSIS — I480 Paroxysmal atrial fibrillation: Secondary | ICD-10-CM | POA: Diagnosis not present

## 2021-08-02 DIAGNOSIS — I480 Paroxysmal atrial fibrillation: Secondary | ICD-10-CM | POA: Diagnosis not present

## 2021-08-03 ENCOUNTER — Telehealth: Payer: Self-pay | Admitting: Family Medicine

## 2021-08-03 DIAGNOSIS — M5441 Lumbago with sciatica, right side: Secondary | ICD-10-CM

## 2021-08-03 DIAGNOSIS — I480 Paroxysmal atrial fibrillation: Secondary | ICD-10-CM | POA: Diagnosis not present

## 2021-08-03 DIAGNOSIS — G8929 Other chronic pain: Secondary | ICD-10-CM

## 2021-08-03 NOTE — Telephone Encounter (Signed)
Pt is asking why PCP sent medication atorvastatin (LIPITOR) 40 MG tablet.Pt stated he has a heart Dr. [name] would of prescribed this not PCP. Pt is upset requesting explanation and would like a call back as soon as possible.

## 2021-08-03 NOTE — Telephone Encounter (Signed)
Patient called to discuss atorvastatin. He says that he just received it on 07/21/21 from the pharmacy and Dr. Margarita Rana is the prescriber. He says he doesn't understand why she prescribed this medication and he was not told by her or his cardiologist that he has high cholesterol. He says he would like a call back and is also requesting a referral for PT. He says he has neck pain and stiffness since he came back from the trip and would like to have a referral for PT. I advised an appointment to discuss, first available with Dr. Margarita Rana scheduled on Wednesday 09/29/21 at 1610. He prefers morning, he was placed on waiting list and advised.
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
## 2021-08-04 ENCOUNTER — Telehealth: Payer: Self-pay | Admitting: Family Medicine

## 2021-08-04 ENCOUNTER — Ambulatory Visit (INDEPENDENT_AMBULATORY_CARE_PROVIDER_SITE_OTHER): Payer: Medicaid Other

## 2021-08-04 DIAGNOSIS — J309 Allergic rhinitis, unspecified: Secondary | ICD-10-CM | POA: Diagnosis not present

## 2021-08-04 DIAGNOSIS — I480 Paroxysmal atrial fibrillation: Secondary | ICD-10-CM | POA: Diagnosis not present
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
## 2021-08-04 NOTE — Telephone Encounter (Signed)
Pt was called and informed of referral being placed. ?

## 2021-08-04 NOTE — Telephone Encounter (Signed)
Referral has been placed. 

## 2021-08-05 DIAGNOSIS — I480 Paroxysmal atrial fibrillation: Secondary | ICD-10-CM | POA: Diagnosis not present

## 2021-08-05 NOTE — Telephone Encounter (Signed)
Referral has been placed. 

## 2021-08-06 DIAGNOSIS — I480 Paroxysmal atrial fibrillation: Secondary | ICD-10-CM | POA: Diagnosis not present

## 2021-08-07 DIAGNOSIS — I480 Paroxysmal atrial fibrillation: Secondary | ICD-10-CM | POA: Diagnosis not present

## 2021-08-08 DIAGNOSIS — I480 Paroxysmal atrial fibrillation: Secondary | ICD-10-CM | POA: Diagnosis not present

## 2021-08-09 DIAGNOSIS — I480 Paroxysmal atrial fibrillation: Secondary | ICD-10-CM | POA: Diagnosis not present

## 2021-08-10 DIAGNOSIS — I480 Paroxysmal atrial fibrillation: Secondary | ICD-10-CM | POA: Diagnosis not present

## 2021-08-11 DIAGNOSIS — I480 Paroxysmal atrial fibrillation: Secondary | ICD-10-CM | POA: Diagnosis not present

## 2021-08-12 ENCOUNTER — Ambulatory Visit (INDEPENDENT_AMBULATORY_CARE_PROVIDER_SITE_OTHER): Payer: Medicaid Other

## 2021-08-12 DIAGNOSIS — I480 Paroxysmal atrial fibrillation: Secondary | ICD-10-CM | POA: Diagnosis not present

## 2021-08-12 DIAGNOSIS — J309 Allergic rhinitis, unspecified: Secondary | ICD-10-CM | POA: Diagnosis not present

## 2021-08-13 DIAGNOSIS — I480 Paroxysmal atrial fibrillation: Secondary | ICD-10-CM | POA: Diagnosis not present

## 2021-08-14 DIAGNOSIS — I480 Paroxysmal atrial fibrillation: Secondary | ICD-10-CM | POA: Diagnosis not present

## 2021-08-15 DIAGNOSIS — I480 Paroxysmal atrial fibrillation: Secondary | ICD-10-CM | POA: Diagnosis not present

## 2021-08-16 DIAGNOSIS — I480 Paroxysmal atrial fibrillation: Secondary | ICD-10-CM | POA: Diagnosis not present

## 2021-08-17 DIAGNOSIS — I480 Paroxysmal atrial fibrillation: Secondary | ICD-10-CM | POA: Diagnosis not present

## 2021-08-18 DIAGNOSIS — I480 Paroxysmal atrial fibrillation: Secondary | ICD-10-CM | POA: Diagnosis not present

## 2021-08-19 DIAGNOSIS — I480 Paroxysmal atrial fibrillation: Secondary | ICD-10-CM | POA: Diagnosis not present

## 2021-08-20 ENCOUNTER — Ambulatory Visit (INDEPENDENT_AMBULATORY_CARE_PROVIDER_SITE_OTHER): Payer: Medicaid Other | Admitting: *Deleted

## 2021-08-20 DIAGNOSIS — I480 Paroxysmal atrial fibrillation: Secondary | ICD-10-CM | POA: Diagnosis not present

## 2021-08-20 DIAGNOSIS — J309 Allergic rhinitis, unspecified: Secondary | ICD-10-CM | POA: Diagnosis not present

## 2021-08-21 DIAGNOSIS — I480 Paroxysmal atrial fibrillation: Secondary | ICD-10-CM | POA: Diagnosis not present

## 2021-08-22 DIAGNOSIS — I480 Paroxysmal atrial fibrillation: Secondary | ICD-10-CM | POA: Diagnosis not present

## 2021-08-23 DIAGNOSIS — I480 Paroxysmal atrial fibrillation: Secondary | ICD-10-CM | POA: Diagnosis not present

## 2021-08-24 DIAGNOSIS — I480 Paroxysmal atrial fibrillation: Secondary | ICD-10-CM | POA: Diagnosis not present

## 2021-08-24 NOTE — Therapy (Signed)
OUTPATIENT PHYSICAL THERAPY EVALUATION   Patient Name: Jason Stokes MRN: [id number] DOB:July 02, 1961, 61 y.o., male Today's Date: 08/26/2021   PT End of Session - 08/26/21 1130     Visit Number 1    Number of Visits 8    Date for PT Re-Evaluation 10/21/21    Authorization Type Med Pay / [employer] [place]    PT Start Time 1000    PT Stop Time 1045    PT Time Calculation (min) 45 min    Activity Tolerance Patient limited by pain    Behavior During Therapy [facility] for tasks assessed/performed             Past Medical History:  Diagnosis Date   Anxiety    Bilateral carpal tunnel syndrome 01/10/2018   Bipolar disorder ([place])    Chronic lower back pain    Depression    Dysrhythmia    a-fib   GERD (gastroesophageal reflux disease)    History of alcohol abuse    History of nuclear stress test    Myoview 10/16: EF 50%, diaphragmatic attenuation, no ischemia, low risk   Hypertension    Migraine 2012-2014   Moderate persistent asthma with acute exacerbation 05/02/2018   PAF (paroxysmal atrial fibrillation) ([place]) 03/27/2015   a. Myoview neg for ischemia >> Flecainide started 10/16 >> FU ETT    Schizophrenia ([place])    Sciatica neuralgia    Small vessel disease ([place])    Right basal ganglia stroke   Stroke [facility]) "between 2012-2014"   residual "AF" (09/22/2015)   Past Surgical History:  Procedure Laterality Date   ATRIAL FIBRILLATION ABLATION  09/22/2015   CYST EXCISION  1996-97   surgery back of head    ELECTROPHYSIOLOGIC STUDY N/A 09/22/2015   Procedure: Atrial Fibrillation Ablation;  Surgeon: Will [name], MD;  Location: [facility];  Service: Cardiovascular;  Laterality: N/A;   ELECTROPHYSIOLOGIC STUDY N/A 12/10/2015   Procedure: Atrial Fibrillation Ablation;  Surgeon: Will [name], MD;  Location: [facility];  Service: Cardiovascular;  Laterality: N/A;   ELECTROPHYSIOLOGIC STUDY N/A 12/11/2015   Procedure: Cardioversion;  Surgeon: Will [name], MD;  Location:  [facility];  Service: Cardiovascular;  Laterality: N/A;   EMBOLIZATION Right 08/19/2020   Procedure: EMBOLIZATION;  Surgeon: Cherre Robins, MD;  Location: [facility];  Service: Cardiovascular;  Laterality: Right;  hypogastric   EXCISION MASS HEAD N/A 01/06/2017   Procedure: EXCISION MASS FOREHEAD;  Surgeon: Irene Limbo, MD;  Location: [facility];  Service: Plastics;  Laterality: N/A;   GANGLION CYST EXCISION Left    INTERCOSTAL NERVE BLOCK  2005   KNEE ARTHROSCOPY Right 2016   MASS EXCISION N/A 01/25/2021   Procedure: EXCISION SUBCUTANEOUS VS SUBFASCIAL MASS TORSO 3CM;  Surgeon: Irene Limbo, MD;  Location: [facility];  Service: Plastics;  Laterality: N/A;   Patient Active Problem List   Diagnosis Date Noted   Secondary hypercoagulable state ([place]) 03/23/2021   Type 2 diabetes mellitus with hyperglycemia, without long-term current use of insulin ([place]) 02/10/2021   Neck pain on left side 03/06/2019   Musculoskeletal chest pain 07/24/2018   Asthma, mild intermittent 05/02/2018   Allergic rhinitis caused by mold 05/02/2018   Tobacco use 05/02/2018   Bilateral carpal tunnel syndrome 01/10/2018   Stroke ([place])    Schizophrenia (HCC)    Migraine    History of nuclear stress test    History of alcohol abuse    GERD (  gastroesophageal reflux disease)    Dysrhythmia    Chronic lower back pain    Anxiety    Arthritis of knee, right 04/12/2017   Bipolar disorder ([place]) 04/03/2017   Lipoma of forehead 09/22/2016   Trigger ring finger of right hand 06/22/2016   Paroxysmal atrial fibrillation (HCC)    AF (atrial fibrillation) ([place]) 12/10/2015   Eczema 07/10/2015   History of CVA (cerebrovascular accident) 05/04/2015   Tear of medial meniscus of right knee 03/18/2015   Chronic pain of right knee 03/12/2015   Other and unspecified hyperlipidemia 11/25/2013   Nasal congestion 11/25/2013   Sinusitis, chronic 05/09/2013   Chronic low back  pain 10/12/2012   Lumbar radiculopathy 10/12/2012   Hypertension 10/12/2012   Depression 10/12/2012   Insomnia 10/12/2012    PCP: Charlott Rakes, MD  REFERRING PROVIDER: Charlott Rakes, MD  REFERRING DIAG: Chronic midline low back pain with right-sided sciatica  THERAPY DIAG:  Chronic bilateral low back pain, unspecified whether sciatica present  Cervicalgia  Muscle weakness (generalized)  Other abnormalities of gait and mobility  ONSET DATE: ongoing for 10+ years   SUBJECTIVE:            SUBJECTIVE STATEMENT: Patient reports his back and neck are bothering him again. He previously had therapy and states that it helped, but since then his back and neck have been getting painful. He does also report occasional right leg sciatic pain when walking too long. He states he took a trip to [place] which is what aggravated his pain again, before the [place] trip he was walking 3-4 miles a day but now has a lot of trouble walking longer periods.  PERTINENT HISTORY:  Chronic pain, bipolar, HTN, history of CVA, depression/anxiety, Type II DM  PAIN:  Are you having pain? Yes NPRS scale: 8-9/10 Pain location: Back Pain orientation: Lower, right PAIN TYPE: Chronic Pain description: Pressure, sharp Aggravating factors: Sitting extended periods, avoids bending, walking Relieving factors: Medication  Are you having pain? Yes NPRS scale: 8-9/10 Pain location: Neck Pain orientation: Right PAIN TYPE: Chronic Pain description: Pressure, sharp Aggravating factors: Moving neck too much to quickly  Relieving factors: Medication  PRECAUTIONS: Fall  WEIGHT BEARING RESTRICTIONS No  FALLS:  Has patient fallen in last 6 months? Yes, Number of falls: 1, states right knee gave out  LIVING ENVIRONMENT: Lives with: lives with their family  OCCUPATION: Unemployed  PLOF: Independent  PATIENT GOALS: Patient wants to be able to move better with less pain    OBJECTIVE:  DIAGNOSTIC  FINDINGS:  N/A  PATIENT SURVEYS:  Modified Oswestry 32/50   SCREENING FOR RED FLAGS:  Negative  COGNITION: Overall cognitive status: Within functional limits for tasks assessed     SENSATION: Light touch: Deficits patient reports generalized sensation deficit RLE   MUSCLE LENGTH: Hamstring significant limitation bilaterally, when assessing right side patient began having spasm of right leg that resolved after a few seconds of patient relaxing  POSTURE:  Patient exhibits rounded shoulder and forward head posture, shoulders in elevated/tensed position, he demonstrates trunk lean to right and consistently shift weight due to pain  PALPATION: Generalized tenderness cervical and lumbar paraspinals, bilateral upper trap region   LUMBAR AROM  AROM A/PROM  08/26/2021  Flexion <25%  Extension 0%  Right lateral flexion 25%  Left lateral flexion 25%  Right rotation 25%  Left rotation 25%   Cervical AROM:  AROM A/PROM  08/26/2021  Flexion 15  Extension 20  Right lateral flexion 15  Left  lateral flexion 20  Right rotation 20  Left rotation       25   LE MMT:  MMT Right 08/26/2021 Left 08/26/2021  Hip flexion 4- 4-  Hip extension 3- 3-  Hip abduction 3- 3-  Knee flexion 4 4  Knee extension 4 4  Gross periscapular musculature 4-   LUMBAR SPECIAL TESTS:  Not assessed  FUNCTIONAL TESTS:  Not assessed  GAIT: Assistive device utilized: Single point cane Level of assistance: Modified independence Comments: Cane used on right, antalgic gait on right, decreased gait speed   TODAY'S TREATMENT  LTR x 10 Piriformis stretch x 20 sec each SLR x 5 each - partial range Bridge x 5 - partial range Seated hamstring stretch 2 x 20 sec each Seated shoulder blade squeezes x 5 Seated upper trap stretch 3 x 10 sec each  PATIENT EDUCATION:  Education details: Exam findings, POC, HEP Person educated: Patient Education method: Explanation, Demonstration, Tactile cues, Verbal cues,  and Handouts Education comprehension: verbalized understanding, returned demonstration, verbal cues required, tactile cues required, and needs further education  HOME EXERCISE PROGRAM: Access Code: [email or web address]   ASSESSMENT: CLINICAL IMPRESSION: Patient is a 61 y.o. male who was seen today for physical therapy evaluation and treatment for chronic lower back pain with right radiculopathy, and chronic neck pain. He demonstrates significant deficits in lumbar and cervical motion with increased muscular tension especially around cervical region, gross strength and flexibility deficits, and difficulty with mobility due to pain. Objective impairments include Abnormal gait, decreased activity tolerance, decreased balance, difficulty walking, decreased ROM, decreased strength, impaired flexibility, impaired sensation, postural dysfunction, and pain. These impairments are limiting patient from cleaning, community activity, driving, meal prep, occupation, laundry, yard work, and shopping. Personal factors including Fitness, Past/current experiences, Time since onset of injury/illness/exacerbation, and 3+ comorbidities: Chronic pain, bipolar, HTN, history of CVA, depression/anxiety, Type II DM  are also affecting patient's functional outcome. Patient will benefit from skilled PT to address above impairments and improve overall function.  REHAB POTENTIAL: Fair  CLINICAL DECISION MAKING: Evolving/moderate complexity  EVALUATION COMPLEXITY: Moderate   GOALS: Goals reviewed with patient? Yes  SHORT TERM GOALS:  STG Name Target Date Goal status  1 Patient will be I with initial HEP in order to progress with therapy. Baseline:  09/23/2021 INITIAL  2 Patient will report pain level </= 6/10 in order to reduce functional limitations Baseline: patient reports 8-9/10 pain level 09/23/2021 INITIAL   LONG TERM GOALS:   LTG Name Target Date Goal status  1 Patient will be I with final HEP to maintain progress from  PT. Baseline: HEP provided at evlauation 10/21/2021 INITIAL  2 Patient will report </= 22/50 Modified Oswestry in order to indicate improvement in functional ability. Baseline: 32/50 10/21/2021 INITIAL  3 Patient will report </= 4/10 pain level with household activity and ambulation to reduce functional limitation  Baseline: 8-9/10 pain at evaluation 10/21/2021 INITIAL  4 Patient will be able to walk >/= 30 minutes without requiring rest due to pain in order to improve community access Baseline: patient only able to walk short distances due to pain 10/21/2021 INITIAL    PLAN: PT FREQUENCY: 1x/week  PT DURATION: 1 week  PLANNED INTERVENTIONS: Therapeutic exercises, Therapeutic activity, Neuro Muscular re-education, Balance training, Gait training, Patient/Family education, Joint mobilization, Aquatic Therapy, Dry Needling, Electrical stimulation, Spinal mobilization, Cryotherapy, Moist heat, Taping, and Manual therapy  PLAN FOR NEXT SESSION: Review HEP and progress PRN, manual/dry needling mainly for cervical region, general stretching/mobility for  cervical/lumbar/hip motion, progress strength as tolerated for posture and walking ability   Hilda Blades, PT, DPT, LAT, ATC 08/26/21  1:35 PM Phone: [phone] Fax: [phone]

## 2021-08-25 DIAGNOSIS — I480 Paroxysmal atrial fibrillation: Secondary | ICD-10-CM | POA: Diagnosis not present

## 2021-08-26 ENCOUNTER — Ambulatory Visit: Payer: Medicaid Other | Attending: Family Medicine | Admitting: Physical Therapy

## 2021-08-26 ENCOUNTER — Other Ambulatory Visit: Payer: Self-pay

## 2021-08-26 ENCOUNTER — Ambulatory Visit (INDEPENDENT_AMBULATORY_CARE_PROVIDER_SITE_OTHER): Payer: Medicaid Other

## 2021-08-26 ENCOUNTER — Encounter: Payer: Self-pay | Admitting: Physical Therapy

## 2021-08-26 DIAGNOSIS — G8929 Other chronic pain: Secondary | ICD-10-CM | POA: Insufficient documentation

## 2021-08-26 DIAGNOSIS — M545 Low back pain, unspecified: Secondary | ICD-10-CM | POA: Insufficient documentation

## 2021-08-26 DIAGNOSIS — J309 Allergic rhinitis, unspecified: Secondary | ICD-10-CM

## 2021-08-26 DIAGNOSIS — M542 Cervicalgia: Secondary | ICD-10-CM | POA: Insufficient documentation

## 2021-08-26 DIAGNOSIS — R2689 Other abnormalities of gait and mobility: Secondary | ICD-10-CM | POA: Insufficient documentation

## 2021-08-26 DIAGNOSIS — M6281 Muscle weakness (generalized): Secondary | ICD-10-CM | POA: Diagnosis not present

## 2021-08-26 DIAGNOSIS — I480 Paroxysmal atrial fibrillation: Secondary | ICD-10-CM | POA: Diagnosis not present

## 2021-08-26 DIAGNOSIS — M5441 Lumbago with sciatica, right side: Secondary | ICD-10-CM | POA: Insufficient documentation

## 2021-08-26 NOTE — Patient Instructions (Signed)
Access Code: [facility] URL: [email or web address] Date: 08/26/2021 Prepared by: Hilda Blades  Exercises Seated Scapular Retraction - 1-2 x daily - 10 reps - 3 seconds hold Gentle Upper Trap Stretch - 1-2 x daily - 3 reps - 10 seconds hold Supine Lower Trunk Rotation - 1-2 x daily - 10 reps - 10 seconds hold Supine Piriformis Stretch with Foot on Ground - 1-2 x daily - 2 reps - 20 seconds hold Small Range Straight Leg Raise - 1-2 x daily - 2 sets - 5 reps Supine Bridge - 1-2 x daily - 2 sets - 5 reps Seated Hamstring Stretch - 1-2 x daily - 2 reps - 20 seconds hold

## 2021-08-27 DIAGNOSIS — I480 Paroxysmal atrial fibrillation: Secondary | ICD-10-CM | POA: Diagnosis not present

## 2021-08-28 DIAGNOSIS — I480 Paroxysmal atrial fibrillation: Secondary | ICD-10-CM | POA: Diagnosis not present

## 2021-08-29 DIAGNOSIS — I480 Paroxysmal atrial fibrillation: Secondary | ICD-10-CM | POA: Diagnosis not present

## 2021-08-30 DIAGNOSIS — I480 Paroxysmal atrial fibrillation: Secondary | ICD-10-CM | POA: Diagnosis not present

## 2021-08-31 DIAGNOSIS — I480 Paroxysmal atrial fibrillation: Secondary | ICD-10-CM | POA: Diagnosis not present

## 2021-09-01 ENCOUNTER — Ambulatory Visit (INDEPENDENT_AMBULATORY_CARE_PROVIDER_SITE_OTHER): Payer: Medicaid Other

## 2021-09-01 DIAGNOSIS — J309 Allergic rhinitis, unspecified: Secondary | ICD-10-CM | POA: Diagnosis not present

## 2021-09-01 DIAGNOSIS — I480 Paroxysmal atrial fibrillation: Secondary | ICD-10-CM | POA: Diagnosis not present

## 2021-09-02 ENCOUNTER — Other Ambulatory Visit: Payer: Self-pay

## 2021-09-02 ENCOUNTER — Ambulatory Visit: Payer: Medicaid Other

## 2021-09-02 DIAGNOSIS — M542 Cervicalgia: Secondary | ICD-10-CM | POA: Diagnosis not present

## 2021-09-02 DIAGNOSIS — G8929 Other chronic pain: Secondary | ICD-10-CM | POA: Diagnosis not present

## 2021-09-02 DIAGNOSIS — M6281 Muscle weakness (generalized): Secondary | ICD-10-CM | POA: Diagnosis not present

## 2021-09-02 DIAGNOSIS — M5441 Lumbago with sciatica, right side: Secondary | ICD-10-CM | POA: Diagnosis not present

## 2021-09-02 DIAGNOSIS — M545 Low back pain, unspecified: Secondary | ICD-10-CM | POA: Diagnosis not present

## 2021-09-02 DIAGNOSIS — I480 Paroxysmal atrial fibrillation: Secondary | ICD-10-CM | POA: Diagnosis not present

## 2021-09-02 DIAGNOSIS — R2689 Other abnormalities of gait and mobility: Secondary | ICD-10-CM | POA: Diagnosis not present

## 2021-09-02 NOTE — Therapy (Signed)
OUTPATIENT PHYSICAL THERAPY TREATMENT NOTE   Patient Name: Jason Stokes MRN: [id number] DOB:1960-10-15, 61 y.o., male Today's Date: 09/02/2021  PCP: Charlott Rakes, MD REFERRING PROVIDER: Charlott Rakes, MD   PT End of Session - 09/02/21 1056     Visit Number 2    Number of Visits 8    Date for PT Re-Evaluation 10/21/21    Authorization Type Med Pay / MCD [place]    PT Start Time 1055    PT Stop Time 1125    PT Time Calculation (min) 30 min    Activity Tolerance Patient limited by pain    Behavior During Therapy [facility] for tasks assessed/performed             Past Medical History:  Diagnosis Date   Anxiety    Bilateral carpal tunnel syndrome 01/10/2018   Bipolar disorder ([place])    Chronic lower back pain    Depression    Dysrhythmia    a-fib   GERD (gastroesophageal reflux disease)    History of alcohol abuse    History of nuclear stress test    Myoview 10/16: EF 50%, diaphragmatic attenuation, no ischemia, low risk   Hypertension    Migraine 2012-2014   Moderate persistent asthma with acute exacerbation 05/02/2018   PAF (paroxysmal atrial fibrillation) ([place]) 03/27/2015   a. Myoview neg for ischemia >> Flecainide started 10/16 >> FU ETT    Schizophrenia ([place])    Sciatica neuralgia    Small vessel disease ([place])    Right basal ganglia stroke   Stroke [facility]) "between 2012-2014"   residual "AF" (09/22/2015)   Past Surgical History:  Procedure Laterality Date   ATRIAL FIBRILLATION ABLATION  09/22/2015   CYST EXCISION  1996-97   surgery back of head    ELECTROPHYSIOLOGIC STUDY N/A 09/22/2015   Procedure: Atrial Fibrillation Ablation;  Surgeon: Will [name], MD;  Location: [place] CV LAB;  Service: Cardiovascular;  Laterality: N/A;   ELECTROPHYSIOLOGIC STUDY N/A 12/10/2015   Procedure: Atrial Fibrillation Ablation;  Surgeon: Will [name], MD;  Location: [place] CV LAB;  Service: Cardiovascular;  Laterality: N/A;   ELECTROPHYSIOLOGIC STUDY N/A 12/11/2015    Procedure: Cardioversion;  Surgeon: Will [name], MD;  Location: [place] CV LAB;  Service: Cardiovascular;  Laterality: N/A;   EMBOLIZATION Right 08/19/2020   Procedure: EMBOLIZATION;  Surgeon: Cherre Robins, MD;  Location: [place] CV LAB;  Service: Cardiovascular;  Laterality: Right;  hypogastric   EXCISION MASS HEAD N/A 01/06/2017   Procedure: EXCISION MASS FOREHEAD;  Surgeon: Irene Limbo, MD;  Location: [place];  Service: Plastics;  Laterality: N/A;   GANGLION CYST EXCISION Left    INTERCOSTAL NERVE BLOCK  2005   KNEE ARTHROSCOPY Right 2016   MASS EXCISION N/A 01/25/2021   Procedure: EXCISION SUBCUTANEOUS VS SUBFASCIAL MASS TORSO 3CM;  Surgeon: Irene Limbo, MD;  Location: [place];  Service: Plastics;  Laterality: N/A;   Patient Active Problem List   Diagnosis Date Noted   Secondary hypercoagulable state ([place]) 03/23/2021   Type 2 diabetes mellitus with hyperglycemia, without long-term current use of insulin ([place]) 02/10/2021   Neck pain on left side 03/06/2019   Musculoskeletal chest pain 07/24/2018   Asthma, mild intermittent 05/02/2018   Allergic rhinitis caused by mold 05/02/2018   Tobacco use 05/02/2018   Bilateral carpal tunnel syndrome 01/10/2018   Stroke ([place])    Schizophrenia (HCC)    Migraine    History of nuclear stress test  History of alcohol abuse    GERD (gastroesophageal reflux disease)    Dysrhythmia    Chronic lower back pain    Anxiety    Arthritis of knee, right 04/12/2017   Bipolar disorder ([place]) 04/03/2017   Lipoma of forehead 09/22/2016   Trigger ring finger of right hand 06/22/2016   Paroxysmal atrial fibrillation (HCC)    AF (atrial fibrillation) ([place]) 12/10/2015   Eczema 07/10/2015   History of CVA (cerebrovascular accident) 05/04/2015   Tear of medial meniscus of right knee 03/18/2015   Chronic pain of right knee 03/12/2015   Other and unspecified hyperlipidemia 11/25/2013   Nasal  congestion 11/25/2013   Sinusitis, chronic 05/09/2013   Chronic low back pain 10/12/2012   Lumbar radiculopathy 10/12/2012   Hypertension 10/12/2012   Depression 10/12/2012   Insomnia 10/12/2012    REFERRING DIAG: Chronic midline low back pain with right-sided sciatica  THERAPY DIAG:  Chronic bilateral low back pain, unspecified whether sciatica present  Cervicalgia  Muscle weakness (generalized)  PERTINENT HISTORY: Chronic pain, bipolar, HTN, history of CVA, depression/anxiety, Type II DM  PRECAUTIONS: fall  SUBJECTIVE: bothered mainly by allergies today, has been compliant with HEP  PAIN:  Are you having pain? Yes NPRS scale: 8/10 Pain location: Neck Pain orientation: Right PAIN TYPE: Chronic Pain description: Pressure, sharp Aggravating factors: Moving neck too much to quickly  Relieving factors: Medication  Are you having pain? Yes NPRS scale: 8/10 Pain location: Back Pain orientation: Lower, right PAIN TYPE: Chronic Pain description: Pressure, sharp Aggravating factors: Sitting extended periods, avoids bending, walking Relieving factors: Medication     OBJECTIVE:  DIAGNOSTIC FINDINGS:  N/A   PATIENT SURVEYS:  Modified Oswestry 32/50    SCREENING FOR RED FLAGS:           Negative   COGNITION: Overall cognitive status: Within functional limits for tasks assessed                        SENSATION: Light touch: Deficits patient reports generalized sensation deficit RLE    MUSCLE LENGTH: Hamstring significant limitation bilaterally, when assessing right side patient began having spasm of right leg that resolved after a few seconds of patient relaxing   POSTURE:  Patient exhibits rounded shoulder and forward head posture, shoulders in elevated/tensed position, he demonstrates trunk lean to right and consistently shift weight due to pain   PALPATION: Generalized tenderness cervical and lumbar paraspinals, bilateral upper trap region    LUMBAR AROM    AROM A/PROM  08/26/2021  Flexion <25%  Extension 0%  Right lateral flexion 25%  Left lateral flexion 25%  Right rotation 25%  Left rotation 25%    Cervical AROM:   AROM A/PROM  08/26/2021  Flexion 15  Extension 20  Right lateral flexion 15  Left lateral flexion 20  Right rotation 20  Left rotation       25    LE MMT:   MMT Right 08/26/2021 Left 08/26/2021  Hip flexion 4- 4-  Hip extension 3- 3-  Hip abduction 3- 3-  Knee flexion 4 4  Knee extension 4 4  Gross periscapular musculature 4-    LUMBAR SPECIAL TESTS:  Not assessed   FUNCTIONAL TESTS:  Not assessed   GAIT: Assistive device utilized: Single point cane Level of assistance: Modified independence Comments: Cane used on right, antalgic gait on right, decreased gait speed     TODAY'S TREATMENT   [facility] Adult PT Treatment:  DATE: 09/02/21 Therapeutic Exercise: LTR x 10 Piriformis stretch x 30 sec each, 2x B SLR x 5 each - partial range Bridge x 5 - partial range Seated hamstring stretch 2 x 20 sec each Seated shoulder blade squeezes x 5 Seated upper trap stretch 3 x 10 sec each Nustep 6 min L2, arms 10  SKTC 30s x2 B Chin tuck x5, seated  Evaluation treatment: LTR x 10 Piriformis stretch x 20 sec each SLR x 5 each - partial range Bridge x 5 - partial range Seated hamstring stretch 2 x 20 sec each Seated shoulder blade squeezes x 5 Seated upper trap stretch 3 x 10 sec each   PATIENT EDUCATION:  Education details: Exam findings, POC, HEP Person educated: Patient Education method: Explanation, Demonstration, Tactile cues, Verbal cues, and Handouts Education comprehension: verbalized understanding, returned demonstration, verbal cues required, tactile cues required, and needs further education   HOME EXERCISE PROGRAM: Access Code: [email or web address]     ASSESSMENT: CLINICAL IMPRESSION: 10 min late for session.  No change in symptoms yet, mainly bothered by  allergies today.  Purpose of session was HEP review and correction as needed.  RLE tremor observed needing time to resolve, not new to patient.  Able to perform all tasks w/o symptom increase, added new exercises/stretches as noted.  Showing improved posture and flexibility at end of session.   REHAB POTENTIAL: Fair   CLINICAL DECISION MAKING: Evolving/moderate complexity   EVALUATION COMPLEXITY: Moderate     GOALS: Goals reviewed with patient? Yes   SHORT TERM GOALS:   STG Name Target Date Goal status  1 Patient will be I with initial HEP in order to progress with therapy. Baseline:  09/23/2021 INITIAL  2 Patient will report pain level </= 6/10 in order to reduce functional limitations Baseline: patient reports 8-9/10 pain level 09/23/2021 INITIAL    LONG TERM GOALS:    LTG Name Target Date Goal status  1 Patient will be I with final HEP to maintain progress from PT. Baseline: HEP provided at evlauation 10/21/2021 INITIAL  2 Patient will report </= 22/50 Modified Oswestry in order to indicate improvement in functional ability. Baseline: 32/50 10/21/2021 INITIAL  3 Patient will report </= 4/10 pain level with household activity and ambulation to reduce functional limitation  Baseline: 8-9/10 pain at evaluation 10/21/2021 INITIAL  4 Patient will be able to walk >/= 30 minutes without requiring rest due to pain in order to improve community access Baseline: patient only able to walk short distances due to pain 10/21/2021 INITIAL      PLAN: PT FREQUENCY: 1x/week   PT DURATION: 8 weeks   PLANNED INTERVENTIONS: Therapeutic exercises, Therapeutic activity, Neuro Muscular re-education, Balance training, Gait training, Patient/Family education, Joint mobilization, Aquatic Therapy, Dry Needling, Electrical stimulation, Spinal mobilization, Cryotherapy, Moist heat, Taping, and Manual therapy   PLAN FOR NEXT SESSION: Review HEP and progress PRN, manual/dry needling mainly for cervical region,  general stretching/mobility for cervical/lumbar/hip motion, progress strength as tolerated for posture and walking ability, core strength and general flexibility tasks for postural improvement    Lanice Shirts, PT 09/02/2021, 10:58 AM

## 2021-09-03 DIAGNOSIS — I480 Paroxysmal atrial fibrillation: Secondary | ICD-10-CM | POA: Diagnosis not present

## 2021-09-04 DIAGNOSIS — I480 Paroxysmal atrial fibrillation: Secondary | ICD-10-CM | POA: Diagnosis not present

## 2021-09-05 DIAGNOSIS — I480 Paroxysmal atrial fibrillation: Secondary | ICD-10-CM | POA: Diagnosis not present

## 2021-09-06 DIAGNOSIS — I480 Paroxysmal atrial fibrillation: Secondary | ICD-10-CM | POA: Diagnosis not present

## 2021-09-07 ENCOUNTER — Ambulatory Visit: Payer: Medicaid Other | Admitting: Physical Therapy

## 2021-09-07 ENCOUNTER — Other Ambulatory Visit: Payer: Self-pay

## 2021-09-07 ENCOUNTER — Encounter: Payer: Self-pay | Admitting: Physical Therapy

## 2021-09-07 ENCOUNTER — Ambulatory Visit (INDEPENDENT_AMBULATORY_CARE_PROVIDER_SITE_OTHER): Payer: Medicaid Other

## 2021-09-07 DIAGNOSIS — J309 Allergic rhinitis, unspecified: Secondary | ICD-10-CM

## 2021-09-07 DIAGNOSIS — M5441 Lumbago with sciatica, right side: Secondary | ICD-10-CM | POA: Diagnosis not present

## 2021-09-07 DIAGNOSIS — M545 Low back pain, unspecified: Secondary | ICD-10-CM

## 2021-09-07 DIAGNOSIS — M542 Cervicalgia: Secondary | ICD-10-CM

## 2021-09-07 DIAGNOSIS — I480 Paroxysmal atrial fibrillation: Secondary | ICD-10-CM | POA: Diagnosis not present

## 2021-09-07 DIAGNOSIS — R2689 Other abnormalities of gait and mobility: Secondary | ICD-10-CM

## 2021-09-07 DIAGNOSIS — M6281 Muscle weakness (generalized): Secondary | ICD-10-CM

## 2021-09-07 DIAGNOSIS — G8929 Other chronic pain: Secondary | ICD-10-CM | POA: Diagnosis not present

## 2021-09-07 MED ORDER — ALBUTEROL SULFATE HFA 108 (90 BASE) MCG/ACT IN AERS: 2.0000 | INHALATION_SPRAY | RESPIRATORY_TRACT | 0 refills | Status: DC | PRN

## 2021-09-07 MED ORDER — OLOPATADINE HCL 0.2 % OP SOLN: 1.0000 [drp] | Freq: Every day | OPHTHALMIC | 5 refills | Status: DC | PRN

## 2021-09-07 MED ORDER — FLUTICASONE FUROATE-VILANTEROL 200-25 MCG/ACT IN AEPB: 1.0000 | INHALATION_SPRAY | Freq: Every day | RESPIRATORY_TRACT | 2 refills | Status: DC

## 2021-09-07 NOTE — Patient Instructions (Signed)
Access Code: [facility] URL: [email or web address] Date: 09/07/2021 Prepared by: Hilda Blades  Exercises Seated Scapular Retraction - 1-2 x daily - 10 reps - 3 seconds hold Gentle Upper Trap Stretch - 1-2 x daily - 3 reps - 10 seconds hold Supine Lower Trunk Rotation - 1-2 x daily - 10 reps - 10 seconds hold Supine Piriformis Stretch with Foot on Ground - 1-2 x daily - 2 reps - 20 seconds hold Small Range Straight Leg Raise - 1-2 x daily - 2 sets - 5 reps Supine Bridge - 1-2 x daily - 2 sets - 5 reps Sidelying Thoracic Lumbar Rotation - 1-2 x daily - 10 reps - 10 secons hold Seated Hamstring Stretch - 1-2 x daily - 2 reps - 20 seconds hold Standing 'L' Stretch at Counter - 1-2 x daily - 10 reps - 10 seconds hold Banded Row - 1-2 x daily - 2 sets - 10 reps

## 2021-09-07 NOTE — Therapy (Signed)
OUTPATIENT PHYSICAL THERAPY TREATMENT NOTE   Patient Name: Jason Stokes MRN: [id number] DOB:Apr 23, 1961, 61 y.o., male Today's Date: 09/07/2021  PCP: Charlott Rakes, MD REFERRING PROVIDER: Charlott Rakes, MD   PT End of Session - 09/07/21 1013     Visit Number 3    Number of Visits 8    Date for PT Re-Evaluation 10/21/21    Authorization Type Med Pay / MCD [place]    Authorization Time Period 09/02/2021 - 09/22/2021    Authorization - Visit Number 2    Authorization - Number of Visits 3    PT Start Time 1009    PT Stop Time 1048    PT Time Calculation (min) 39 min    Activity Tolerance Patient limited by pain    Behavior During Therapy [facility] for tasks assessed/performed              Past Medical History:  Diagnosis Date   Anxiety    Bilateral carpal tunnel syndrome 01/10/2018   Bipolar disorder ([place])    Chronic lower back pain    Depression    Dysrhythmia    a-fib   GERD (gastroesophageal reflux disease)    History of alcohol abuse    History of nuclear stress test    Myoview 10/16: EF 50%, diaphragmatic attenuation, no ischemia, low risk   Hypertension    Migraine 2012-2014   Moderate persistent asthma with acute exacerbation 05/02/2018   PAF (paroxysmal atrial fibrillation) ([place]) 03/27/2015   a. Myoview neg for ischemia >> Flecainide started 10/16 >> FU ETT    Schizophrenia ([place])    Sciatica neuralgia    Small vessel disease ([place])    Right basal ganglia stroke   Stroke [facility]) "between 2012-2014"   residual "AF" (09/22/2015)   Past Surgical History:  Procedure Laterality Date   ATRIAL FIBRILLATION ABLATION  09/22/2015   CYST EXCISION  1996-97   surgery back of head    ELECTROPHYSIOLOGIC STUDY N/A 09/22/2015   Procedure: Atrial Fibrillation Ablation;  Surgeon: Will [name], MD;  Location: [place] CV LAB;  Service: Cardiovascular;  Laterality: N/A;   ELECTROPHYSIOLOGIC STUDY N/A 12/10/2015   Procedure: Atrial Fibrillation Ablation;  Surgeon: Will [name], MD;  Location: [place] CV LAB;  Service: Cardiovascular;  Laterality: N/A;   ELECTROPHYSIOLOGIC STUDY N/A 12/11/2015   Procedure: Cardioversion;  Surgeon: Will [name], MD;  Location: [place] CV LAB;  Service: Cardiovascular;  Laterality: N/A;   EMBOLIZATION Right 08/19/2020   Procedure: EMBOLIZATION;  Surgeon: Cherre Robins, MD;  Location: [place] CV LAB;  Service: Cardiovascular;  Laterality: Right;  hypogastric   EXCISION MASS HEAD N/A 01/06/2017   Procedure: EXCISION MASS FOREHEAD;  Surgeon: Irene Limbo, MD;  Location: [place];  Service: Plastics;  Laterality: N/A;   GANGLION CYST EXCISION Left    INTERCOSTAL NERVE BLOCK  2005   KNEE ARTHROSCOPY Right 2016   MASS EXCISION N/A 01/25/2021   Procedure: EXCISION SUBCUTANEOUS VS SUBFASCIAL MASS TORSO 3CM;  Surgeon: Irene Limbo, MD;  Location: [place];  Service: Plastics;  Laterality: N/A;   Patient Active Problem List   Diagnosis Date Noted   Secondary hypercoagulable state ([place]) 03/23/2021   Type 2 diabetes mellitus with hyperglycemia, without long-term current use of insulin ([place]) 02/10/2021   Neck pain on left side 03/06/2019   Musculoskeletal chest pain 07/24/2018   Asthma, mild intermittent 05/02/2018   Allergic rhinitis caused by mold 05/02/2018   Tobacco use 05/02/2018  Bilateral carpal tunnel syndrome 01/10/2018   Stroke ([place])    Schizophrenia ([place])    Migraine    History of nuclear stress test    History of alcohol abuse    GERD (gastroesophageal reflux disease)    Dysrhythmia    Chronic lower back pain    Anxiety    Arthritis of knee, right 04/12/2017   Bipolar disorder ([place]) 04/03/2017   Lipoma of forehead 09/22/2016   Trigger ring finger of right hand 06/22/2016   Paroxysmal atrial fibrillation (HCC)    AF (atrial fibrillation) ([place]) 12/10/2015   Eczema 07/10/2015   History of CVA (cerebrovascular accident) 05/04/2015   Tear of medial  meniscus of right knee 03/18/2015   Chronic pain of right knee 03/12/2015   Other and unspecified hyperlipidemia 11/25/2013   Nasal congestion 11/25/2013   Sinusitis, chronic 05/09/2013   Chronic low back pain 10/12/2012   Lumbar radiculopathy 10/12/2012   Hypertension 10/12/2012   Depression 10/12/2012   Insomnia 10/12/2012    REFERRING DIAG: Chronic midline low back pain with right-sided sciatica  THERAPY DIAG:  Chronic bilateral low back pain, unspecified whether sciatica present  Cervicalgia  Muscle weakness (generalized)  Other abnormalities of gait and mobility  PERTINENT HISTORY: Chronic pain, bipolar, HTN, history of CVA, depression/anxiety, Type II DM  PRECAUTIONS: fall  SUBJECTIVE: Patient reports doing better but right knee is starting to swell up and be painful. He states burning sensation of the neck and right shoulder region.  PAIN:  Are you having pain? Yes NPRS scale: 9/10 Pain location: Neck Pain orientation: Right PAIN TYPE: Chronic Pain description: Pressure, sharp Aggravating factors: Moving neck too much to quickly  Relieving factors: Medication  Are you having pain? Yes NPRS scale: 8/10 Pain location: Back Pain orientation: Lower, right PAIN TYPE: Chronic Pain description: Pressure, sharp Aggravating factors: Sitting extended periods, avoids bending, walking Relieving factors: Medication   OBJECTIVE:  PATIENT SURVEYS:  Modified Oswestry 32/50    SENSATION: Light touch: Deficits patient reports generalized sensation deficit RLE    MUSCLE LENGTH: Hamstring significant limitation bilaterally, when assessing right side patient began having spasm of right leg that resolved after a few seconds of patient relaxing   POSTURE:  Patient exhibits rounded shoulder and forward head posture, shoulders in elevated/tensed position, he demonstrates trunk lean to right and consistently shift weight due to pain   PALPATION: Generalized tenderness  cervical and lumbar paraspinals, bilateral upper trap region    LUMBAR AROM   AROM A/PROM  08/26/2021  Flexion <25%  Extension 0%  Right lateral flexion 25%  Left lateral flexion 25%  Right rotation 25%  Left rotation 25%    Cervical AROM:   AROM A/PROM  08/26/2021  09/07/2021  Flexion 15   Extension 20   Right lateral flexion 15   Left lateral flexion 20   Right rotation 20 30  Left rotation         25         30    LE MMT:   MMT Right 08/26/2021 Left 08/26/2021  Hip flexion 4- 4-  Hip extension 3- 3-  Hip abduction 3- 3-  Knee flexion 4 4  Knee extension 4 4  Gross periscapular musculature 4-   GAIT: Assistive device utilized: Single point cane Level of assistance: Modified independence Comments: Cane used on right, antalgic gait on right, decreased gait speed     TODAY'S TREATMENT   09/07/2021:  Therapeutic Exercise: NuStep L5 x 5 min with UE/LE  while taking subjective LTR 10 x 5 sec each Piriformis stretch 2 x 20 sec each Sidelying thoracic rotation 10 x 5 sec each Upper trap stretch 2 x 20 sec each Standing bent over step back stretch at counter 10 x 5 sec Row with blue 2 x 10 Seated hamstring stretch 2 x 30 sec each   09/02/21: Therapeutic Exercise: LTR x 10 Piriformis stretch x 30 sec each, 2x B SLR x 5 each - partial range Bridge x 5 - partial range Seated hamstring stretch 2 x 20 sec each Seated shoulder blade squeezes x 5 Seated upper trap stretch 3 x 10 sec each Nustep 6 min L2, arms 10  SKTC 30s x2 B Chin tuck x5, seated  08/26/2021: (Evaluation): Therapeutic Exercise: LTR x 10 Piriformis stretch x 20 sec each SLR x 5 each - partial range Bridge x 5 - partial range Seated hamstring stretch 2 x 20 sec each Seated shoulder blade squeezes x 5 Seated upper trap stretch 3 x 10 sec each   PATIENT EDUCATION:  Education details: HEP update Person educated: Patient Education method: Explanation, Demonstration, Tactile cues, Verbal cues, and  Handouts Education comprehension: verbalized understanding, returned demonstration, verbal cues required, tactile cues required, and needs further education   HOME EXERCISE PROGRAM: Access Code: [email or web address]     ASSESSMENT: CLINICAL IMPRESSION: Patient with fair tolerance for therapy, reporting no adverse effects. He arrived late to therapy so did not have time to perform TPDN for cervical region this visit. Therapy focused primarily on progressing mobility and lumbar, thoracic, and cervical regions. Patient reporting ongoing burning sensation of neck, upper back, and shoulders likely muscular related. Updated HEP this visit to incorporate more mobility and postural exercises. Patient would benefit from continued skilled PT to progress his mobility and strength in order to reduce pain and maximize functional ability.  Objective impairments include Abnormal gait, decreased activity tolerance, decreased balance, difficulty walking, decreased ROM, decreased strength, impaired flexibility, impaired sensation, postural dysfunction, and pain.      GOALS: Goals reviewed with patient? Yes   SHORT TERM GOALS:   STG Name Target Date Goal status  1 Patient will be I with initial HEP in order to progress with therapy. Baseline:  09/23/2021 INITIAL  2 Patient will report pain level </= 6/10 in order to reduce functional limitations Baseline: patient reports 8-9/10 pain level 09/23/2021 INITIAL    LONG TERM GOALS:    LTG Name Target Date Goal status  1 Patient will be I with final HEP to maintain progress from PT. Baseline: HEP provided at evlauation 10/21/2021 INITIAL  2 Patient will report </= 22/50 Modified Oswestry in order to indicate improvement in functional ability. Baseline: 32/50 10/21/2021 INITIAL  3 Patient will report </= 4/10 pain level with household activity and ambulation to reduce functional limitation  Baseline: 8-9/10 pain at evaluation 10/21/2021 INITIAL  4 Patient will be able to walk >/=  30 minutes without requiring rest due to pain in order to improve community access Baseline: patient only able to walk short distances due to pain 10/21/2021 INITIAL      PLAN: PT FREQUENCY: 1x/week   PT DURATION: 8 weeks   PLANNED INTERVENTIONS: Therapeutic exercises, Therapeutic activity, Neuro Muscular re-education, Balance training, Gait training, Patient/Family education, Joint mobilization, Aquatic Therapy, Dry Needling, Electrical stimulation, Spinal mobilization, Cryotherapy, Moist heat, Taping, and Manual therapy   PLAN FOR NEXT SESSION: Review HEP and progress PRN, manual/dry needling mainly for cervical region, general stretching/mobility for cervical/lumbar/hip motion, progress strength  as tolerated for posture and walking ability, core strength and general flexibility tasks for postural improvement    Hilda Blades, PT, DPT, LAT, ATC 09/07/21  10:49 AM Phone: [phone] Fax: [phone]

## 2021-09-08 DIAGNOSIS — I480 Paroxysmal atrial fibrillation: Secondary | ICD-10-CM | POA: Diagnosis not present

## 2021-09-09 ENCOUNTER — Telehealth: Payer: Self-pay | Admitting: *Deleted

## 2021-09-09 DIAGNOSIS — I480 Paroxysmal atrial fibrillation: Secondary | ICD-10-CM | POA: Diagnosis not present

## 2021-09-09 NOTE — Telephone Encounter (Signed)
PA has been submitted through [facility] [employer] for [employer] and is currently pending approval/denial.

## 2021-09-09 NOTE — Therapy (Signed)
OUTPATIENT PHYSICAL THERAPY TREATMENT NOTE   Patient Name: Jason Stokes MRN: [id number] DOB:[birth year], 61 y.o., male Today's Date: 09/13/2021  PCP: Charlott Rakes, MD REFERRING PROVIDER: Charlott Rakes, MD   PT End of Session - 09/13/21 0959     Visit Number 4    Number of Visits 8    Date for PT Re-Evaluation 10/21/21    Authorization Type [employer] / [employer]    Authorization Time Period 09/02/2021 - 09/22/2021    Authorization - Visit Number 3    Authorization - Number of Visits 3    PT Start Time 0955    PT Stop Time 1040    PT Time Calculation (min) 45 min    Activity Tolerance Patient limited by pain    Behavior During Therapy [facility] for tasks assessed/performed               Past Medical History:  Diagnosis Date   Anxiety    Bilateral carpal tunnel syndrome 01/10/2018   Bipolar disorder ([place])    Chronic lower back pain    Depression    Dysrhythmia    a-fib   GERD (gastroesophageal reflux disease)    History of alcohol abuse    History of nuclear stress test    Myoview 10/16: EF 50%, diaphragmatic attenuation, no ischemia, low risk   Hypertension    Migraine 2012-2014   Moderate persistent asthma with acute exacerbation 05/02/2018   PAF (paroxysmal atrial fibrillation) ([place]) 03/27/2015   a. Myoview neg for ischemia >> Flecainide started 10/16 >> FU ETT    Schizophrenia ([place])    Sciatica neuralgia    Small vessel disease ([place])    Right basal ganglia stroke   Stroke [facility]) "between 2012-2014"   residual "AF" (09/22/2015)   Past Surgical History:  Procedure Laterality Date   ATRIAL FIBRILLATION ABLATION  09/22/2015   CYST EXCISION  1996-97   surgery back of head    ELECTROPHYSIOLOGIC STUDY N/A 09/22/2015   Procedure: Atrial Fibrillation Ablation;  Surgeon: Will [name], MD;  Location: [place] [facility];  Service: Cardiovascular;  Laterality: N/A;   ELECTROPHYSIOLOGIC STUDY N/A 12/10/2015   Procedure: Atrial Fibrillation Ablation;  Surgeon: Will [name], MD;  Location: [place] [facility];  Service: Cardiovascular;  Laterality: N/A;   ELECTROPHYSIOLOGIC STUDY N/A 12/11/2015   Procedure: Cardioversion;  Surgeon: Will [name], MD;  Location: [place] [facility];  Service: Cardiovascular;  Laterality: N/A;   EMBOLIZATION Right 08/19/2020   Procedure: EMBOLIZATION;  Surgeon: Cherre Robins, MD;  Location: [place] [facility];  Service: Cardiovascular;  Laterality: Right;  hypogastric   EXCISION MASS HEAD N/A 01/06/2017   Procedure: EXCISION MASS FOREHEAD;  Surgeon: Irene Limbo, MD;  Location: [place];  Service: Plastics;  Laterality: N/A;   GANGLION CYST EXCISION Left    INTERCOSTAL NERVE BLOCK  2005   KNEE ARTHROSCOPY Right 2016   MASS EXCISION N/A 01/25/2021   Procedure: EXCISION SUBCUTANEOUS VS SUBFASCIAL MASS TORSO 3CM;  Surgeon: Irene Limbo, MD;  Location: [place];  Service: Plastics;  Laterality: N/A;   Patient Active Problem List   Diagnosis Date Noted   Secondary hypercoagulable state ([place]) 03/23/2021   Type 2 diabetes mellitus with hyperglycemia, without long-term current use of insulin ([place]) 02/10/2021   Neck pain on left side 03/06/2019   Musculoskeletal chest pain 07/24/2018   Asthma, mild intermittent 05/02/2018   Allergic rhinitis caused by mold 05/02/2018   Tobacco use 05/02/2018  Bilateral carpal tunnel syndrome 01/10/2018   Stroke ([place])    Schizophrenia ([place])    Migraine    History of nuclear stress test    History of alcohol abuse    GERD (gastroesophageal reflux disease)    Dysrhythmia    Chronic lower back pain    Anxiety    Arthritis of knee, right 04/12/2017   Bipolar disorder ([place]) 04/03/2017   Lipoma of forehead 09/22/2016   Trigger ring finger of right hand 06/22/2016   Paroxysmal atrial fibrillation ([facility])    AF (atrial fibrillation) ([place]) 12/10/2015   Eczema 07/10/2015   History of CVA (cerebrovascular accident) 05/04/2015   Tear of medial  meniscus of right knee 03/18/2015   Chronic pain of right knee 03/12/2015   Other and unspecified hyperlipidemia 11/25/2013   Nasal congestion 11/25/2013   Sinusitis, chronic 05/09/2013   Chronic low back pain 10/12/2012   Lumbar radiculopathy 10/12/2012   Hypertension 10/12/2012   Depression 10/12/2012   Insomnia 10/12/2012    REFERRING DIAG: Chronic midline low back pain with right-sided sciatica  THERAPY DIAG:  Chronic bilateral low back pain, unspecified whether sciatica present  Cervicalgia  Muscle weakness (generalized)  Other abnormalities of gait and mobility  PERTINENT HISTORY: Chronic pain, bipolar, HTN, history of CVA, depression/anxiety, Type II DM  PRECAUTIONS: fall  SUBJECTIVE: Patient reports his neck continues to be tight and makes it difficult to move his neck. He also continues to have difficulty walking longer distance, still limited to short distances using cane.  PAIN:  Are you having pain? Yes NPRS scale: 8/10 Pain location: Neck Pain orientation: Right PAIN TYPE: Chronic Pain description: Constant, tight, pressure, sharp Aggravating factors: Moving neck too much to quickly  Relieving factors: Medication  Are you having pain? Yes NPRS scale: 6/10 Pain location: Back Pain orientation: Lower, right PAIN TYPE: Chronic Pain description: Pressure, sharp Aggravating factors: Sitting extended periods, avoids bending, walking Relieving factors: Medication   OBJECTIVE:  PATIENT SURVEYS:  Modified Oswestry 30/50    SENSATION: Light touch: Deficits patient reports generalized sensation deficit RLE    MUSCLE LENGTH: Hamstring significant limitation bilaterally, when assessing right side patient began having spasm of right leg that resolved after a few seconds of patient relaxing   POSTURE:  Patient exhibits rounded shoulder and forward head posture, shoulders in elevated/tensed position, he demonstrates trunk lean to right and consistently shift  weight due to pain   PALPATION: Generalized tenderness cervical and lumbar paraspinals, bilateral upper trap region    LUMBAR AROM   AROM A/PROM  08/26/2021  Flexion <25%  Extension 0%  Right lateral flexion 25%  Left lateral flexion 25%  Right rotation 25%  Left rotation 25%    Cervical AROM:   AROM A/PROM  08/26/2021  09/07/2021  09/13/2021  Flexion 15  20  Extension 20  30  Right lateral flexion 15  20  Left lateral flexion 20  30  Right rotation 20 30 35  Left rotation         25         30         40    LE MMT:   MMT Right 08/26/2021 Left 08/26/2021 Right / Left 09/13/2021  Hip flexion 4- 4- 4- / 4-  Hip extension 3- 3- 3 / 3  Hip abduction 3- 3- 3 / 3  Knee flexion 4 4 4  / 4  Knee extension 4 4 4  / 4  Gross periscapular musculature 4- 4-  GAIT: Assistive device utilized: Single point cane Level of assistance: Modified independence Comments: Cane used on right, antalgic gait on right, decreased gait speed     TODAY'S TREATMENT  09/13/2021:  Therapeutic Exercise: NuStep L5 x 5 min with UE/LE while taking subjective LTR 10 x 5 sec each Sidelying thoracic rotation 10 x 5 sec each Upper trap stretch 2 x 20 sec each Standing bent over step back stretch at counter 10 x 5 sec Seated cervical retraction 10 x 5 sec Manual: Skilled palpation and monitoring of muscular tension while performing TPDN treatment STM for bilateral upper trap regions Suboccipital release with gentle manual traction x 3 bouts Trigger Point Dry Needling Treatment: Pre-treatment instruction: Patient instructed on dry needling rationale, procedures, and possible side effects including pain during treatment (achy,cramping feeling), bruising, drop of blood, lightheadedness, nausea, sweating. Patient Consent Given: Yes Education handout provided: No Muscles treated: Bilateral upper trap and splenius cervicis/capitus  Needle size and number: .30x48mm x 6 Electrical stimulation performed:  No Parameters: N/A Treatment response/outcome: Twitch response elicited, Palpable decrease in muscle tension, and patient reported improved tightness with cervical motion  Post-treatment instructions: Patient instructed to expect possible mild to moderate muscle soreness later today and/or tomorrow. Patient instructed in methods to reduce muscle soreness and to continue prescribed HEP. If patient was dry needled over the lung field, patient was instructed on signs and symptoms of pneumothorax and, however unlikely, to see immediate medical attention should they occur. Patient was also educated on signs and symptoms of infection and to seek medical attention should they occur. Patient verbalized understanding of these instructions and education.   09/07/2021:  Therapeutic Exercise: NuStep L5 x 5 min with UE/LE while taking subjective LTR 10 x 5 sec each Piriformis stretch 2 x 20 sec each Sidelying thoracic rotation 10 x 5 sec each Upper trap stretch 2 x 20 sec each Standing bent over step back stretch at counter 10 x 5 sec Row with blue 2 x 10 Seated hamstring stretch 2 x 30 sec each  09/02/21: Therapeutic Exercise: LTR x 10 Piriformis stretch x 30 sec each, 2x B SLR x 5 each - partial range Bridge x 5 - partial range Seated hamstring stretch 2 x 20 sec each Seated shoulder blade squeezes x 5 Seated upper trap stretch 3 x 10 sec each Nustep 6 min L2, arms 10  SKTC 30s x2 B Chin tuck x5, seated   PATIENT EDUCATION:  Education details: HEP, TPDN Person educated: Patient Education method: Explanation, Demonstration, Tactile cues, Verbal cues Education comprehension: verbalized understanding, returned demonstration, verbal cues required, tactile cues required, and needs further education   HOME EXERCISE PROGRAM: Access Code: [email or web address]     ASSESSMENT: CLINICAL IMPRESSION: Patient with improved tolerance for therapy, reporting no adverse effects. Overall he seems to be improving and  reported improved disability level on modified ODI with improved strength and motion. Performed TPDN this visit with good response and patient reporting improve neck tightness. He did demonstrate improve cervical motion in all directions this visit compared to evaluation. Therapy focused primarily on progressing mobility and postural control. Patient did require extensive cueing for proper technique with cervical retraction exercise. No changes made to HEP this visit. Patient would benefit from continued skilled PT to progress his mobility and strength in order to reduce pain and maximize functional ability.  Objective impairments include Abnormal gait, decreased activity tolerance, decreased balance, difficulty walking, decreased ROM, decreased strength, impaired flexibility, impaired sensation, postural dysfunction, and pain.  GOALS: Goals reviewed with patient? Yes   SHORT TERM GOALS:   STG Name Target Date Goal status  1 Patient will be I with initial HEP in order to progress with therapy. Baseline: patient reports consistency with initial HEP 09/23/2021 ACHIEVED  2 Patient will report pain level </= 6/10 in order to reduce functional limitations Baseline: patient reports 8/10 neck pain, 6/10 low back pain 09/23/2021 ONGOING    LONG TERM GOALS:    LTG Name Target Date Goal status  1 Patient will be I with final HEP to maintain progress from PT. Baseline: progressing toward final HEP 10/21/2021 ONGOING  2 Patient will report </= 22/50 Modified Oswestry in order to indicate improvement in functional ability. Baseline: 30/50 (32/50 at evaluation) 10/21/2021 ONGOING  3 Patient will report </= 4/10 pain level with household activity and ambulation to reduce functional limitation  Baseline: 6-8/10 pain level 10/21/2021 ONGOING  4 Patient will be able to walk >/= 30 minutes without requiring rest due to pain in order to improve community access Baseline: patient only able to walk short distances due  to pain 10/21/2021 ONGOING      PLAN: PT FREQUENCY: 1x/week   PT DURATION: 8 weeks   PLANNED INTERVENTIONS: Therapeutic exercises, Therapeutic activity, Neuro Muscular re-education, Balance training, Gait training, Patient/Family education, Joint mobilization, Aquatic Therapy, Dry Needling, Electrical stimulation, Spinal mobilization, Cryotherapy, Moist heat, Taping, and Manual therapy   PLAN FOR NEXT SESSION: Review HEP and progress PRN, manual/dry needling mainly for cervical region, general stretching/mobility for cervical/lumbar/hip motion, progress strength as tolerated for posture and walking ability, core strength and general flexibility tasks for postural improvement    Hilda Blades, PT, DPT, LAT, ATC 09/13/21  10:40 AM Phone: [phone] Fax: [phone]

## 2021-09-10 DIAGNOSIS — I480 Paroxysmal atrial fibrillation: Secondary | ICD-10-CM | POA: Diagnosis not present

## 2021-09-11 DIAGNOSIS — I480 Paroxysmal atrial fibrillation: Secondary | ICD-10-CM | POA: Diagnosis not present

## 2021-09-12 DIAGNOSIS — I480 Paroxysmal atrial fibrillation: Secondary | ICD-10-CM | POA: Diagnosis not present

## 2021-09-13 ENCOUNTER — Other Ambulatory Visit: Payer: Self-pay

## 2021-09-13 ENCOUNTER — Ambulatory Visit: Payer: Medicaid Other | Admitting: Physical Therapy

## 2021-09-13 ENCOUNTER — Encounter: Payer: Self-pay | Admitting: Physical Therapy

## 2021-09-13 DIAGNOSIS — M542 Cervicalgia: Secondary | ICD-10-CM | POA: Diagnosis not present

## 2021-09-13 DIAGNOSIS — M5441 Lumbago with sciatica, right side: Secondary | ICD-10-CM | POA: Diagnosis not present

## 2021-09-13 DIAGNOSIS — M545 Low back pain, unspecified: Secondary | ICD-10-CM

## 2021-09-13 DIAGNOSIS — I480 Paroxysmal atrial fibrillation: Secondary | ICD-10-CM | POA: Diagnosis not present

## 2021-09-13 DIAGNOSIS — R2689 Other abnormalities of gait and mobility: Secondary | ICD-10-CM

## 2021-09-13 DIAGNOSIS — M6281 Muscle weakness (generalized): Secondary | ICD-10-CM | POA: Diagnosis not present

## 2021-09-13 DIAGNOSIS — G8929 Other chronic pain: Secondary | ICD-10-CM

## 2021-09-13 NOTE — Telephone Encounter (Signed)
PA was denied for [address], waiting for fax determination from insurance to determine best alternatives for the patient.

## 2021-09-14 DIAGNOSIS — I480 Paroxysmal atrial fibrillation: Secondary | ICD-10-CM | POA: Diagnosis not present

## 2021-09-15 DIAGNOSIS — I480 Paroxysmal atrial fibrillation: Secondary | ICD-10-CM | POA: Diagnosis not present

## 2021-09-16 DIAGNOSIS — I480 Paroxysmal atrial fibrillation: Secondary | ICD-10-CM | POA: Diagnosis not present

## 2021-09-17 ENCOUNTER — Ambulatory Visit (INDEPENDENT_AMBULATORY_CARE_PROVIDER_SITE_OTHER): Payer: Medicaid Other

## 2021-09-17 DIAGNOSIS — J309 Allergic rhinitis, unspecified: Secondary | ICD-10-CM | POA: Diagnosis not present

## 2021-09-17 DIAGNOSIS — I480 Paroxysmal atrial fibrillation: Secondary | ICD-10-CM | POA: Diagnosis not present

## 2021-09-17 NOTE — Therapy (Incomplete)
OUTPATIENT PHYSICAL THERAPY TREATMENT NOTE   Patient Name: Jason Stokes MRN: [id number] DOB:[birth year], 61 y.o., male Today's Date: 09/17/2021  PCP: Charlott Rakes, MD REFERRING PROVIDER: Charlott Rakes, MD       Past Medical History:  Diagnosis Date   Anxiety    Bilateral carpal tunnel syndrome 01/10/2018   Bipolar disorder ([place])    Chronic lower back pain    Depression    Dysrhythmia    a-fib   GERD (gastroesophageal reflux disease)    History of alcohol abuse    History of nuclear stress test    Myoview 10/16: EF 50%, diaphragmatic attenuation, no ischemia, low risk   Hypertension    Migraine 2012-2014   Moderate persistent asthma with acute exacerbation 05/02/2018   PAF (paroxysmal atrial fibrillation) ([place]) 03/27/2015   a. Myoview neg for ischemia >> Flecainide started 10/16 >> FU ETT    Schizophrenia ([place])    Sciatica neuralgia    Small vessel disease ([place])    Right basal ganglia stroke   Stroke [facility]) "between 2012-2014"   residual "AF" (09/22/2015)   Past Surgical History:  Procedure Laterality Date   ATRIAL FIBRILLATION ABLATION  09/22/2015   CYST EXCISION  1996-97   surgery back of head    ELECTROPHYSIOLOGIC STUDY N/A 09/22/2015   Procedure: Atrial Fibrillation Ablation;  Surgeon: Will [name], MD;  Location: [facility];  Service: Cardiovascular;  Laterality: N/A;   ELECTROPHYSIOLOGIC STUDY N/A 12/10/2015   Procedure: Atrial Fibrillation Ablation;  Surgeon: Will [name], MD;  Location: [facility];  Service: Cardiovascular;  Laterality: N/A;   ELECTROPHYSIOLOGIC STUDY N/A 12/11/2015   Procedure: Cardioversion;  Surgeon: Will [name], MD;  Location: [facility];  Service: Cardiovascular;  Laterality: N/A;   EMBOLIZATION Right 08/19/2020   Procedure: EMBOLIZATION;  Surgeon: Cherre Robins, MD;  Location: [facility];  Service: Cardiovascular;  Laterality: Right;  hypogastric   EXCISION MASS HEAD N/A 01/06/2017    Procedure: EXCISION MASS FOREHEAD;  Surgeon: Irene Limbo, MD;  Location: [facility];  Service: Plastics;  Laterality: N/A;   GANGLION CYST EXCISION Left    INTERCOSTAL NERVE BLOCK  2005   KNEE ARTHROSCOPY Right 2016   MASS EXCISION N/A 01/25/2021   Procedure: EXCISION SUBCUTANEOUS VS SUBFASCIAL MASS TORSO 3CM;  Surgeon: Irene Limbo, MD;  Location: [facility];  Service: Plastics;  Laterality: N/A;   Patient Active Problem List   Diagnosis Date Noted   Secondary hypercoagulable state ([place]) 03/23/2021   Type 2 diabetes mellitus with hyperglycemia, without long-term current use of insulin ([place]) 02/10/2021   Neck pain on left side 03/06/2019   Musculoskeletal chest pain 07/24/2018   Asthma, mild intermittent 05/02/2018   Allergic rhinitis caused by mold 05/02/2018   Tobacco use 05/02/2018   Bilateral carpal tunnel syndrome 01/10/2018   Stroke ([place])    Schizophrenia ([place])    Migraine    History of nuclear stress test    History of alcohol abuse    GERD (gastroesophageal reflux disease)    Dysrhythmia    Chronic lower back pain    Anxiety    Arthritis of knee, right 04/12/2017   Bipolar disorder ([place]) 04/03/2017   Lipoma of forehead 09/22/2016   Trigger ring finger of right hand 06/22/2016   Paroxysmal atrial fibrillation ([facility])    AF (atrial fibrillation) ([place]) 12/10/2015   Eczema 07/10/2015   History of CVA (cerebrovascular accident) 05/04/2015   Tear of medial  meniscus of right knee 03/18/2015   Chronic pain of right knee 03/12/2015   Other and unspecified hyperlipidemia 11/25/2013   Nasal congestion 11/25/2013   Sinusitis, chronic 05/09/2013   Chronic low back pain 10/12/2012   Lumbar radiculopathy 10/12/2012   Hypertension 10/12/2012   Depression 10/12/2012   Insomnia 10/12/2012    REFERRING DIAG: Chronic midline low back pain with right-sided sciatica  THERAPY DIAG:  No diagnosis found.  PERTINENT HISTORY: Chronic pain,  bipolar, HTN, history of CVA, depression/anxiety, Type II DM  PRECAUTIONS: fall  SUBJECTIVE: Patient reports his neck continues to be tight and makes it difficult to move his neck. He also continues to have difficulty walking longer distance, still limited to short distances using cane.  PAIN:  Are you having pain? Yes NPRS scale: 8/10 Pain location: Neck Pain orientation: Right PAIN TYPE: Chronic Pain description: Constant, tight, pressure, sharp Aggravating factors: Moving neck too much to quickly  Relieving factors: Medication  Are you having pain? Yes NPRS scale: 6/10 Pain location: Back Pain orientation: Lower, right PAIN TYPE: Chronic Pain description: Pressure, sharp Aggravating factors: Sitting extended periods, avoids bending, walking Relieving factors: Medication   OBJECTIVE:  PATIENT SURVEYS:  Modified Oswestry 30/50 - 09/13/2021   SENSATION: Light touch: Deficits patient reports generalized sensation deficit RLE    MUSCLE LENGTH: Hamstring significant limitation bilaterally, when assessing right side patient began having spasm of right leg that resolved after a few seconds of patient relaxing   POSTURE:  Patient exhibits rounded shoulder and forward head posture, shoulders in elevated/tensed position, he demonstrates trunk lean to right and consistently shift weight due to pain   PALPATION: Generalized tenderness cervical and lumbar paraspinals, bilateral upper trap region    LUMBAR AROM   AROM A/PROM  08/26/2021  Flexion <25%  Extension 0%  Right lateral flexion 25%  Left lateral flexion 25%  Right rotation 25%  Left rotation 25%    Cervical AROM:   AROM A/PROM  08/26/2021  09/07/2021  09/13/2021  Flexion 15  20  Extension 20  30  Right lateral flexion 15  20  Left lateral flexion 20  30  Right rotation 20 30 35  Left rotation         25         30         40    LE MMT:   MMT Right 08/26/2021 Left 08/26/2021 Right / Left 09/13/2021  Hip flexion  4- 4- 4- / 4-  Hip extension 3- 3- 3 / 3  Hip abduction 3- 3- 3 / 3  Knee flexion 4 4 4  / 4  Knee extension 4 4 4  / 4  Gross periscapular musculature 4- 4-   GAIT: Assistive device utilized: Single point cane Level of assistance: Modified independence Comments: Cane used on right, antalgic gait on right, decreased gait speed     TODAY'S TREATMENT  09/20/2021:  Therapeutic Exercise: NuStep L5 x 5 min with UE/LE while taking subjective LTR 10 x 5 sec each Sidelying thoracic rotation 10 x 5 sec each Upper trap stretch 2 x 20 sec each Standing bent over step back stretch at counter 10 x 5 sec Seated cervical retraction 10 x 5 sec Manual: Skilled palpation and monitoring of muscular tension while performing TPDN treatment STM for bilateral upper trap regions Suboccipital release with gentle manual traction x 3 bouts Trigger Point Dry Needling Treatment: Pre-treatment instruction: Patient instructed on dry needling rationale, procedures, and possible side effects including  pain during treatment (achy,cramping feeling), bruising, drop of blood, lightheadedness, nausea, sweating. Patient Consent Given: Yes Education handout provided: No Muscles treated: Bilateral upper trap and splenius cervicis/capitus  Needle size and number: .30x79mm x 6 Electrical stimulation performed: No Parameters: N/A Treatment response/outcome: Twitch response elicited, Palpable decrease in muscle tension, and patient reported improved tightness with cervical motion  Post-treatment instructions: Patient instructed to expect possible mild to moderate muscle soreness later today and/or tomorrow. Patient instructed in methods to reduce muscle soreness and to continue prescribed HEP. If patient was dry needled over the lung field, patient was instructed on signs and symptoms of pneumothorax and, however unlikely, to see immediate medical attention should they occur. Patient was also educated on signs and symptoms of  infection and to seek medical attention should they occur. Patient verbalized understanding of these instructions and education.   09/13/2021:  Therapeutic Exercise: NuStep L5 x 5 min with UE/LE while taking subjective LTR 10 x 5 sec each Sidelying thoracic rotation 10 x 5 sec each Upper trap stretch 2 x 20 sec each Standing bent over step back stretch at counter 10 x 5 sec Seated cervical retraction 10 x 5 sec Manual: Skilled palpation and monitoring of muscular tension while performing TPDN treatment STM for bilateral upper trap regions Suboccipital release with gentle manual traction x 3 bouts Trigger Point Dry Needling Treatment: Pre-treatment instruction: Patient instructed on dry needling rationale, procedures, and possible side effects including pain during treatment (achy,cramping feeling), bruising, drop of blood, lightheadedness, nausea, sweating. Patient Consent Given: Yes Education handout provided: No Muscles treated: Bilateral upper trap and splenius cervicis/capitus  Needle size and number: .30x90mm x 6 Electrical stimulation performed: No Parameters: N/A Treatment response/outcome: Twitch response elicited, Palpable decrease in muscle tension, and patient reported improved tightness with cervical motion  Post-treatment instructions: Patient instructed to expect possible mild to moderate muscle soreness later today and/or tomorrow. Patient instructed in methods to reduce muscle soreness and to continue prescribed HEP. If patient was dry needled over the lung field, patient was instructed on signs and symptoms of pneumothorax and, however unlikely, to see immediate medical attention should they occur. Patient was also educated on signs and symptoms of infection and to seek medical attention should they occur. Patient verbalized understanding of these instructions and education.  09/07/2021:  Therapeutic Exercise: NuStep L5 x 5 min with UE/LE while taking subjective LTR 10 x 5  sec each Piriformis stretch 2 x 20 sec each Sidelying thoracic rotation 10 x 5 sec each Upper trap stretch 2 x 20 sec each Standing bent over step back stretch at counter 10 x 5 sec Row with blue 2 x 10 Seated hamstring stretch 2 x 30 sec each  09/02/21: Therapeutic Exercise: LTR x 10 Piriformis stretch x 30 sec each, 2x B SLR x 5 each - partial range Bridge x 5 - partial range Seated hamstring stretch 2 x 20 sec each Seated shoulder blade squeezes x 5 Seated upper trap stretch 3 x 10 sec each Nustep 6 min L2, arms 10  SKTC 30s x2 B Chin tuck x5, seated   PATIENT EDUCATION:  Education details: HEP, TPDN Person educated: Patient Education method: Explanation, Demonstration, Tactile cues, Verbal cues Education comprehension: verbalized understanding, returned demonstration, verbal cues required, tactile cues required, and needs further education   HOME EXERCISE PROGRAM: Access Code: [email or web address]     ASSESSMENT: CLINICAL IMPRESSION: Patient with improved tolerance for therapy, reporting no adverse effects. Overall he seems to be improving and reported  improved disability level on modified ODI with improved strength and motion. Performed TPDN this visit with good response and patient reporting improve neck tightness. He did demonstrate improve cervical motion in all directions this visit compared to evaluation. Therapy focused primarily on progressing mobility and postural control. Patient did require extensive cueing for proper technique with cervical retraction exercise. No changes made to HEP this visit. Patient would benefit from continued skilled PT to progress his mobility and strength in order to reduce pain and maximize functional ability.  Objective impairments include Abnormal gait, decreased activity tolerance, decreased balance, difficulty walking, decreased ROM, decreased strength, impaired flexibility, impaired sensation, postural dysfunction, and pain.      GOALS: Goals  reviewed with patient? Yes   SHORT TERM GOALS:   STG Name Target Date Goal status  1 Patient will be I with initial HEP in order to progress with therapy. Baseline: patient reports consistency with initial HEP 09/23/2021 ACHIEVED  2 Patient will report pain level </= 6/10 in order to reduce functional limitations Baseline: patient reports 8/10 neck pain, 6/10 low back pain 09/23/2021 ONGOING    LONG TERM GOALS:    LTG Name Target Date Goal status  1 Patient will be I with final HEP to maintain progress from PT. Baseline: progressing toward final HEP 10/21/2021 ONGOING  2 Patient will report </= 22/50 Modified Oswestry in order to indicate improvement in functional ability. Baseline: 30/50 (32/50 at evaluation) 10/21/2021 ONGOING  3 Patient will report </= 4/10 pain level with household activity and ambulation to reduce functional limitation  Baseline: 6-8/10 pain level 10/21/2021 ONGOING  4 Patient will be able to walk >/= 30 minutes without requiring rest due to pain in order to improve community access Baseline: patient only able to walk short distances due to pain 10/21/2021 ONGOING      PLAN: PT FREQUENCY: 1x/week   PT DURATION: 8 weeks   PLANNED INTERVENTIONS: Therapeutic exercises, Therapeutic activity, Neuro Muscular re-education, Balance training, Gait training, Patient/Family education, Joint mobilization, Aquatic Therapy, Dry Needling, Electrical stimulation, Spinal mobilization, Cryotherapy, Moist heat, Taping, and Manual therapy   PLAN FOR NEXT SESSION: Review HEP and progress PRN, manual/dry needling mainly for cervical region, general stretching/mobility for cervical/lumbar/hip motion, progress strength as tolerated for posture and walking ability, core strength and general flexibility tasks for postural improvement    Hilda Blades, PT, DPT, LAT, ATC 09/17/21  10:26 AM Phone: [phone] Fax: [phone]

## 2021-09-19 DIAGNOSIS — I480 Paroxysmal atrial fibrillation: Secondary | ICD-10-CM | POA: Diagnosis not present

## 2021-09-20 ENCOUNTER — Ambulatory Visit: Payer: Medicaid Other | Admitting: Physical Therapy

## 2021-09-20 DIAGNOSIS — I480 Paroxysmal atrial fibrillation: Secondary | ICD-10-CM | POA: Diagnosis not present

## 2021-09-20 NOTE — Therapy (Incomplete)
OUTPATIENT PHYSICAL THERAPY TREATMENT NOTE   Patient Name: Jason Stokes MRN: [id number] DOB:Jan 16, 1961, 61 y.o., male Today's Date: 09/24/2021  PCP: Charlott Rakes, MD REFERRING PROVIDER: Charlott Rakes, MD       Past Medical History:  Diagnosis Date   Anxiety    Bilateral carpal tunnel syndrome 01/10/2018   Bipolar disorder ([place])    Chronic lower back pain    Depression    Dysrhythmia    a-fib   GERD (gastroesophageal reflux disease)    History of alcohol abuse    History of nuclear stress test    Myoview 10/16: EF 50%, diaphragmatic attenuation, no ischemia, low risk   Hypertension    Migraine 2012-2014   Moderate persistent asthma with acute exacerbation 05/02/2018   PAF (paroxysmal atrial fibrillation) ([place]) 03/27/2015   a. Myoview neg for ischemia >> Flecainide started 10/16 >> FU ETT    Schizophrenia ([place])    Sciatica neuralgia    Small vessel disease ([place])    Right basal ganglia stroke   Stroke [facility]) "between 2012-2014"   residual "AF" (09/22/2015)   Past Surgical History:  Procedure Laterality Date   ATRIAL FIBRILLATION ABLATION  09/22/2015   CYST EXCISION  1996-97   surgery back of head    ELECTROPHYSIOLOGIC STUDY N/A 09/22/2015   Procedure: Atrial Fibrillation Ablation;  Surgeon: Will [name], MD;  Location: [place] [facility];  Service: Cardiovascular;  Laterality: N/A;   ELECTROPHYSIOLOGIC STUDY N/A 12/10/2015   Procedure: Atrial Fibrillation Ablation;  Surgeon: Will [name], MD;  Location: [place] [facility];  Service: Cardiovascular;  Laterality: N/A;   ELECTROPHYSIOLOGIC STUDY N/A 12/11/2015   Procedure: Cardioversion;  Surgeon: Will [name], MD;  Location: [place] [facility];  Service: Cardiovascular;  Laterality: N/A;   EMBOLIZATION Right 08/19/2020   Procedure: EMBOLIZATION;  Surgeon: Cherre Robins, MD;  Location: [place] [facility];  Service: Cardiovascular;  Laterality: Right;  hypogastric   EXCISION MASS HEAD N/A 01/06/2017    Procedure: EXCISION MASS FOREHEAD;  Surgeon: Irene Limbo, MD;  Location: [place];  Service: Plastics;  Laterality: N/A;   GANGLION CYST EXCISION Left    INTERCOSTAL NERVE BLOCK  2005   KNEE ARTHROSCOPY Right 2016   MASS EXCISION N/A 01/25/2021   Procedure: EXCISION SUBCUTANEOUS VS SUBFASCIAL MASS TORSO 3CM;  Surgeon: Irene Limbo, MD;  Location: [place];  Service: Plastics;  Laterality: N/A;   Patient Active Problem List   Diagnosis Date Noted   Secondary hypercoagulable state ([place]) 03/23/2021   Type 2 diabetes mellitus with hyperglycemia, without long-term current use of insulin ([place]) 02/10/2021   Neck pain on left side 03/06/2019   Musculoskeletal chest pain 07/24/2018   Asthma, mild intermittent 05/02/2018   Allergic rhinitis caused by mold 05/02/2018   Tobacco use 05/02/2018   Bilateral carpal tunnel syndrome 01/10/2018   Stroke ([place])    Schizophrenia ([place])    Migraine    History of nuclear stress test    History of alcohol abuse    GERD (gastroesophageal reflux disease)    Dysrhythmia    Chronic lower back pain    Anxiety    Arthritis of knee, right 04/12/2017   Bipolar disorder ([place]) 04/03/2017   Lipoma of forehead 09/22/2016   Trigger ring finger of right hand 06/22/2016   Paroxysmal atrial fibrillation (HCC)    AF (atrial fibrillation) ([place]) 12/10/2015   Eczema 07/10/2015   History of CVA (cerebrovascular accident) 05/04/2015   Tear of medial  meniscus of right knee 03/18/2015   Chronic pain of right knee 03/12/2015   Other and unspecified hyperlipidemia 11/25/2013   Nasal congestion 11/25/2013   Sinusitis, chronic 05/09/2013   Chronic low back pain 10/12/2012   Lumbar radiculopathy 10/12/2012   Hypertension 10/12/2012   Depression 10/12/2012   Insomnia 10/12/2012    REFERRING DIAG: Chronic midline low back pain with right-sided sciatica  THERAPY DIAG:  No diagnosis found.  PERTINENT HISTORY: Chronic pain,  bipolar, HTN, history of CVA, depression/anxiety, Type II DM  PRECAUTIONS: fall  SUBJECTIVE: ***  PAIN:  Are you having pain? Yes NPRS scale: 8/10 Pain location: Neck Pain orientation: Right PAIN TYPE: Chronic Pain description: Constant, tight, pressure, sharp Aggravating factors: Moving neck too much to quickly  Relieving factors: Medication  Are you having pain? Yes NPRS scale: 6/10 Pain location: Back Pain orientation: Lower, right PAIN TYPE: Chronic Pain description: Pressure, sharp Aggravating factors: Sitting extended periods, avoids bending, walking Relieving factors: Medication   OBJECTIVE:  PATIENT SURVEYS:  Modified Oswestry 30/50 - 09/13/2021   SENSATION: Light touch: Deficits patient reports generalized sensation deficit RLE    MUSCLE LENGTH: Hamstring significant limitation bilaterally, when assessing right side patient began having spasm of right leg that resolved after a few seconds of patient relaxing   POSTURE:  Patient exhibits rounded shoulder and forward head posture, shoulders in elevated/tensed position, he demonstrates trunk lean to right and consistently shift weight due to pain   PALPATION: Generalized tenderness cervical and lumbar paraspinals, bilateral upper trap region    LUMBAR AROM   AROM A/PROM  08/26/2021  Flexion <25%  Extension 0%  Right lateral flexion 25%  Left lateral flexion 25%  Right rotation 25%  Left rotation 25%    Cervical AROM:   AROM A/PROM  08/26/2021  09/07/2021  09/13/2021  Flexion 15  20  Extension 20  30  Right lateral flexion 15  20  Left lateral flexion 20  30  Right rotation 20 30 35  Left rotation         25         30         40    LE MMT:   MMT Right 08/26/2021 Left 08/26/2021 Right / Left 09/13/2021  Hip flexion 4- 4- 4- / 4-  Hip extension 3- 3- 3 / 3  Hip abduction 3- 3- 3 / 3  Knee flexion '4 4 4 '$ / 4  Knee extension '4 4 4 '$ / 4  Gross periscapular musculature 4- 4-   GAIT: Assistive device  utilized: Single point cane Level of assistance: Modified independence Comments: Cane used on right, antalgic gait on right, decreased gait speed     TODAY'S TREATMENT  09/24/2021:  Therapeutic Exercise: NuStep L5 x 5 min with UE/LE while taking subjective LTR 10 x 5 sec each Sidelying thoracic rotation 10 x 5 sec each Upper trap stretch 2 x 20 sec each Standing bent over step back stretch at counter 10 x 5 sec Seated cervical retraction 10 x 5 sec Manual: Skilled palpation and monitoring of muscular tension while performing TPDN treatment STM for bilateral upper trap regions Suboccipital release with gentle manual traction x 3 bouts Trigger Point Dry Needling Treatment: Pre-treatment instruction: Patient instructed on dry needling rationale, procedures, and possible side effects including pain during treatment (achy,cramping feeling), bruising, drop of blood, lightheadedness, nausea, sweating. Patient Consent Given: Yes Education handout provided: No Muscles treated: Bilateral upper trap and splenius cervicis/capitus  Needle size  and number: .30x26m x 6 Electrical stimulation performed: No Parameters: N/A Treatment response/outcome: Twitch response elicited, Palpable decrease in muscle tension, and patient reported improved tightness with cervical motion  Post-treatment instructions: Patient instructed to expect possible mild to moderate muscle soreness later today and/or tomorrow. Patient instructed in methods to reduce muscle soreness and to continue prescribed HEP. If patient was dry needled over the lung field, patient was instructed on signs and symptoms of pneumothorax and, however unlikely, to see immediate medical attention should they occur. Patient was also educated on signs and symptoms of infection and to seek medical attention should they occur. Patient verbalized understanding of these instructions and education.   09/13/2021:  Therapeutic Exercise: NuStep L5 x 5 min  with UE/LE while taking subjective LTR 10 x 5 sec each Sidelying thoracic rotation 10 x 5 sec each Upper trap stretch 2 x 20 sec each Standing bent over step back stretch at counter 10 x 5 sec Seated cervical retraction 10 x 5 sec Manual: Skilled palpation and monitoring of muscular tension while performing TPDN treatment STM for bilateral upper trap regions Suboccipital release with gentle manual traction x 3 bouts Trigger Point Dry Needling Treatment: Pre-treatment instruction: Patient instructed on dry needling rationale, procedures, and possible side effects including pain during treatment (achy,cramping feeling), bruising, drop of blood, lightheadedness, nausea, sweating. Patient Consent Given: Yes Education handout provided: No Muscles treated: Bilateral upper trap and splenius cervicis/capitus  Needle size and number: .30x563mx 6 Electrical stimulation performed: No Parameters: N/A Treatment response/outcome: Twitch response elicited, Palpable decrease in muscle tension, and patient reported improved tightness with cervical motion  Post-treatment instructions: Patient instructed to expect possible mild to moderate muscle soreness later today and/or tomorrow. Patient instructed in methods to reduce muscle soreness and to continue prescribed HEP. If patient was dry needled over the lung field, patient was instructed on signs and symptoms of pneumothorax and, however unlikely, to see immediate medical attention should they occur. Patient was also educated on signs and symptoms of infection and to seek medical attention should they occur. Patient verbalized understanding of these instructions and education.  09/07/2021:  Therapeutic Exercise: NuStep L5 x 5 min with UE/LE while taking subjective LTR 10 x 5 sec each Piriformis stretch 2 x 20 sec each Sidelying thoracic rotation 10 x 5 sec each Upper trap stretch 2 x 20 sec each Standing bent over step back stretch at counter 10 x 5  sec Row with blue 2 x 10 Seated hamstring stretch 2 x 30 sec each   PATIENT EDUCATION:  Education details: HEP, TPDN Person educated: Patient Education method: Explanation, Demonstration, Tactile cues, Verbal cues Education comprehension: verbalized understanding, returned demonstration, verbal cues required, tactile cues required, and needs further education   HOME EXERCISE PROGRAM: Access Code: [email or web address]   ASSESSMENT: CLINICAL IMPRESSION: Patient tolerated therapy well with no adverse effects. *** Patient would benefit from continued skilled PT to progress his mobility and strength in order to reduce pain and maximize functional ability.  Overall he seems to be improving and reported improved disability level on modified ODI with improved strength and motion. Performed TPDN this visit with good response and patient reporting improve neck tightness. He did demonstrate improve cervical motion in all directions this visit compared to evaluation. Therapy focused primarily on progressing mobility and postural control. Patient did require extensive cueing for proper technique with cervical retraction exercise. No changes made to HEP this visit.   Objective impairments include Abnormal gait, decreased activity  tolerance, decreased balance, difficulty walking, decreased ROM, decreased strength, impaired flexibility, impaired sensation, postural dysfunction, and pain.      GOALS: Goals reviewed with patient? Yes   SHORT TERM GOALS:   STG Name Target Date Goal status  1 Patient will be I with initial HEP in order to progress with therapy. Baseline: patient reports consistency with initial HEP 09/23/2021 ACHIEVED  2 Patient will report pain level </= 6/10 in order to reduce functional limitations Baseline: patient reports 8/10 neck pain, 6/10 low back pain 09/23/2021 ONGOING    LONG TERM GOALS:    LTG Name Target Date Goal status  1 Patient will be I with final HEP to maintain progress from  PT. Baseline: progressing toward final HEP 10/21/2021 ONGOING  2 Patient will report </= 22/50 Modified Oswestry in order to indicate improvement in functional ability. Baseline: 30/50 (32/50 at evaluation) 10/21/2021 ONGOING  3 Patient will report </= 4/10 pain level with household activity and ambulation to reduce functional limitation  Baseline: 6-8/10 pain level 10/21/2021 ONGOING  4 Patient will be able to walk >/= 30 minutes without requiring rest due to pain in order to improve community access Baseline: patient only able to walk short distances due to pain 10/21/2021 ONGOING      PLAN: PT FREQUENCY: 1x/week   PT DURATION: 8 weeks   PLANNED INTERVENTIONS: Therapeutic exercises, Therapeutic activity, Neuro Muscular re-education, Balance training, Gait training, Patient/Family education, Joint mobilization, Aquatic Therapy, Dry Needling, Electrical stimulation, Spinal mobilization, Cryotherapy, Moist heat, Taping, and Manual therapy   PLAN FOR NEXT SESSION: Review HEP and progress PRN, manual/dry needling mainly for cervical region, general stretching/mobility for cervical/lumbar/hip motion, progress strength as tolerated for posture and walking ability, core strength and general flexibility tasks for postural improvement    Hilda Blades, PT, DPT, LAT, ATC 09/24/21  9:41 AM Phone: [phone] Fax: [phone]

## 2021-09-21 ENCOUNTER — Ambulatory Visit (INDEPENDENT_AMBULATORY_CARE_PROVIDER_SITE_OTHER): Payer: Medicaid Other | Admitting: *Deleted

## 2021-09-21 DIAGNOSIS — J309 Allergic rhinitis, unspecified: Secondary | ICD-10-CM

## 2021-09-21 DIAGNOSIS — I480 Paroxysmal atrial fibrillation: Secondary | ICD-10-CM | POA: Diagnosis not present

## 2021-09-21 NOTE — Progress Notes (Signed)
VIALS EXP 09-22-22 ?

## 2021-09-21 NOTE — Telephone Encounter (Signed)
I have yet to receive a fax determination yet from insurance. Saw patient today and he stated that he has not been having issues with picking up his Breo. I advised that if he has issues to let me know. Patient verbalized understanding.  ?

## 2021-09-22 DIAGNOSIS — J301 Allergic rhinitis due to pollen: Secondary | ICD-10-CM | POA: Diagnosis not present

## 2021-09-22 DIAGNOSIS — I480 Paroxysmal atrial fibrillation: Secondary | ICD-10-CM | POA: Diagnosis not present

## 2021-09-23 DIAGNOSIS — I480 Paroxysmal atrial fibrillation: Secondary | ICD-10-CM | POA: Diagnosis not present

## 2021-09-23 DIAGNOSIS — J3089 Other allergic rhinitis: Secondary | ICD-10-CM | POA: Diagnosis not present

## 2021-09-24 ENCOUNTER — Ambulatory Visit: Payer: Medicaid Other | Admitting: Physical Therapy

## 2021-09-24 DIAGNOSIS — I480 Paroxysmal atrial fibrillation: Secondary | ICD-10-CM | POA: Diagnosis not present

## 2021-09-25 DIAGNOSIS — I480 Paroxysmal atrial fibrillation: Secondary | ICD-10-CM | POA: Diagnosis not present

## 2021-09-26 DIAGNOSIS — I480 Paroxysmal atrial fibrillation: Secondary | ICD-10-CM | POA: Diagnosis not present

## 2021-09-27 ENCOUNTER — Other Ambulatory Visit: Payer: Self-pay

## 2021-09-27 ENCOUNTER — Ambulatory Visit (INDEPENDENT_AMBULATORY_CARE_PROVIDER_SITE_OTHER): Payer: Medicaid Other | Admitting: Physician Assistant

## 2021-09-27 ENCOUNTER — Encounter: Payer: Self-pay | Admitting: Physician Assistant

## 2021-09-27 DIAGNOSIS — M1711 Unilateral primary osteoarthritis, right knee: Secondary | ICD-10-CM

## 2021-09-27 DIAGNOSIS — M25461 Effusion, right knee: Secondary | ICD-10-CM | POA: Diagnosis not present

## 2021-09-27 DIAGNOSIS — I480 Paroxysmal atrial fibrillation: Secondary | ICD-10-CM | POA: Diagnosis not present

## 2021-09-27 MED ORDER — LIDOCAINE HCL 1 % IJ SOLN
3.0000 mL | INTRAMUSCULAR | Status: AC | PRN
  Administered 2021-09-27: 3 mL

## 2021-09-27 MED ORDER — METHYLPREDNISOLONE ACETATE 40 MG/ML IJ SUSP
40.0000 mg | INTRAMUSCULAR | Status: AC | PRN
  Administered 2021-09-27: 40 mg via INTRA_ARTICULAR
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
## 2021-09-28 DIAGNOSIS — I480 Paroxysmal atrial fibrillation: Secondary | ICD-10-CM | POA: Diagnosis not present

## 2021-09-28 NOTE — Therapy (Incomplete)
?OUTPATIENT PHYSICAL THERAPY TREATMENT NOTE ? ? ?Patient Name: Jason Stokes ?MRN: [id number] ?DOB:1960/12/24, 61 y.o., male ?Today's Date: 09/28/2021 ? ?PCP: Charlott Rakes, MD ?REFERRING PROVIDER: Charlott Rakes, MD ? ? ? ? ? ? ?Past Medical History:  ?Diagnosis Date  ? Anxiety   ? Bilateral carpal tunnel syndrome 01/10/2018  ? Bipolar disorder ([place])   ? Chronic lower back pain   ? Depression   ? Dysrhythmia   ? a-fib  ? GERD (gastroesophageal reflux disease)   ? History of alcohol abuse   ? History of nuclear stress test   ? Myoview 10/16: EF 50%, diaphragmatic attenuation, no ischemia, low risk  ? Hypertension   ? Migraine 2012-2014  ? Moderate persistent asthma with acute exacerbation 05/02/2018  ? PAF (paroxysmal atrial fibrillation) ([place]) 03/27/2015  ? a. Myoview neg for ischemia >> Flecainide started 10/16 >> FU ETT   ? Schizophrenia ([place])   ? Sciatica neuralgia   ? Small vessel disease ([place])   ? Right basal ganglia stroke  ? Stroke [facility]) "between 2012-2014"  ? residual "AF" (09/22/2015)  ? ?Past Surgical History:  ?Procedure Laterality Date  ? ATRIAL FIBRILLATION ABLATION  09/22/2015  ? CYST EXCISION  1996-97  ? surgery back of head   ? ELECTROPHYSIOLOGIC STUDY N/A 09/22/2015  ? Procedure: Atrial Fibrillation Ablation;  Surgeon: Will [name], MD;  Location: [place] [facility];  Service: Cardiovascular;  Laterality: N/A;  ? ELECTROPHYSIOLOGIC STUDY N/A 12/10/2015  ? Procedure: Atrial Fibrillation Ablation;  Surgeon: Will [name], MD;  Location: [place] [facility];  Service: Cardiovascular;  Laterality: N/A;  ? ELECTROPHYSIOLOGIC STUDY N/A 12/11/2015  ? Procedure: Cardioversion;  Surgeon: Will [name], MD;  Location: [place] [facility];  Service: Cardiovascular;  Laterality: N/A;  ? EMBOLIZATION Right 08/19/2020  ? Procedure: EMBOLIZATION;  Surgeon: Cherre Robins, MD;  Location: [place] [facility];  Service: Cardiovascular;  Laterality: Right;  hypogastric  ? EXCISION MASS HEAD N/A 01/06/2017  ?  Procedure: EXCISION MASS FOREHEAD;  Surgeon: Irene Limbo, MD;  Location: [place];  Service: Plastics;  Laterality: N/A;  ? GANGLION CYST EXCISION Left   ? INTERCOSTAL NERVE BLOCK  2005  ? KNEE ARTHROSCOPY Right 2016  ? MASS EXCISION N/A 01/25/2021  ? Procedure: EXCISION SUBCUTANEOUS VS SUBFASCIAL MASS TORSO 3CM;  Surgeon: Irene Limbo, MD;  Location: [place];  Service: Plastics;  Laterality: N/A;  ? ?Patient Active Problem List  ? Diagnosis Date Noted  ? Secondary hypercoagulable state ([place]) 03/23/2021  ? Type 2 diabetes mellitus with hyperglycemia, without long-term current use of insulin ([place]) 02/10/2021  ? Neck pain on left side 03/06/2019  ? Musculoskeletal chest pain 07/24/2018  ? Asthma, mild intermittent 05/02/2018  ? Allergic rhinitis caused by mold 05/02/2018  ? Tobacco use 05/02/2018  ? Bilateral carpal tunnel syndrome 01/10/2018  ? Stroke [facility])   ? Schizophrenia ([place])   ? Migraine   ? History of nuclear stress test   ? History of alcohol abuse   ? GERD (gastroesophageal reflux disease)   ? Dysrhythmia   ? Chronic lower back pain   ? Anxiety   ? Arthritis of knee, right 04/12/2017  ? Bipolar disorder ([place]) 04/03/2017  ? Lipoma of forehead 09/22/2016  ? Trigger ring finger of right hand 06/22/2016  ? Paroxysmal atrial fibrillation (H[facility])   ? AF (atrial fibrillation) ([place]) 12/10/2015  ? Eczema 07/10/2015  ? History of CVA (cerebrovascular accident) 05/04/2015  ? Tear of medial  meniscus of right knee 03/18/2015  ? Chronic pain of right knee 03/12/2015  ? Other and unspecified hyperlipidemia 11/25/2013  ? Nasal congestion 11/25/2013  ? Sinusitis, chronic 05/09/2013  ? Chronic low back pain 10/12/2012  ? Lumbar radiculopathy 10/12/2012  ? Hypertension 10/12/2012  ? Depression 10/12/2012  ? Insomnia 10/12/2012  ? ? ?REFERRING DIAG: Chronic midline low back pain with right-sided sciatica ? ?THERAPY DIAG:  ?No diagnosis found. ? ?PERTINENT HISTORY: Chronic pain,  bipolar, HTN, history of CVA, depression/anxiety, Type II DM ? ?PRECAUTIONS: fall ? ?SUBJECTIVE: *** ? ?PAIN:  ?Are you having pain? Yes ?NPRS scale: 8/10 ?Pain location: Neck ?Pain orientation: Right ?PAIN TYPE: Chronic ?Pain description: Constant, tight, pressure, sharp ?Aggravating factors: Moving neck too much to quickly  ?Relieving factors: Medication ? ?Are you having pain? Yes ?NPRS scale: 6/10 ?Pain location: Back ?Pain orientation: Lower, right ?PAIN TYPE: Chronic ?Pain description: Pressure, sharp ?Aggravating factors: Sitting extended periods, avoids bending, walking ?Relieving factors: Medication ? ? ?OBJECTIVE:  ?PATIENT SURVEYS:  ?Modified Oswestry 30/50 - 09/13/2021 ?  ?SENSATION: ?Light touch: Deficits patient reports generalized sensation deficit RLE  ?  ?MUSCLE LENGTH: ?Hamstring significant limitation bilaterally, when assessing right side patient began having spasm of right leg that resolved after a few seconds of patient relaxing ?  ?POSTURE:  ?Patient exhibits rounded shoulder and forward head posture, shoulders in elevated/tensed position, he demonstrates trunk lean to right and consistently shift weight due to pain ?  ?PALPATION: ?Generalized tenderness cervical and lumbar paraspinals, bilateral upper trap region  ?  ?LUMBAR AROM ?  ?AROM A/PROM  ?08/26/2021  ?Flexion <25%  ?Extension 0%  ?Right lateral flexion 25%  ?Left lateral flexion 25%  ?Right rotation 25%  ?Left rotation 25%  ?  ?Cervical AROM: ?  ?AROM A/PROM  ?08/26/2021  ?09/07/2021  ?09/13/2021  ?Flexion 15  20  ?Extension 20  30  ?Right lateral flexion 15  20  ?Left lateral flexion 20  30  ?Right rotation 20 30 35  ?Left rotation         25         30         40  ?  ?LE MMT: ?  ?MMT Right ?08/26/2021 Left ?08/26/2021 Right / Left ?09/13/2021  ?Hip flexion 4- 4- 4- / 4-  ?Hip extension 3- 3- 3 / 3  ?Hip abduction 3- 3- 3 / 3  ?Knee flexion '4 4 4 '$ / 4  ?Knee extension '4 4 4 '$ / 4  ?Gross periscapular musculature 4- 4-  ? ?GAIT: ?Assistive device  utilized: Single point cane ?Level of assistance: Modified independence ?Comments: Cane used on right, antalgic gait on right, decreased gait speed ?  ?  ?TODAY'S TREATMENT  ?09/30/2021: ? Therapeutic Exercise: ?NuStep L5 x 5 min with UE/LE while taking subjective ?LTR 10 x 5 sec each ?Sidelying thoracic rotation 10 x 5 sec each ?Upper trap stretch 2 x 20 sec each ?Standing bent over step back stretch at counter 10 x 5 sec ?Seated cervical retraction 10 x 5 sec ?Manual: ?Skilled palpation and monitoring of muscular tension while performing TPDN treatment ?STM for bilateral upper trap regions ?Suboccipital release with gentle manual traction x 3 bouts ?Trigger Point Dry Needling Treatment: ?Pre-treatment instruction: Patient instructed on dry needling rationale, procedures, and possible side effects including pain during treatment (achy,cramping feeling), bruising, drop of blood, lightheadedness, nausea, sweating. ?Patient Consent Given: Yes ?Education handout provided: No ?Muscles treated: Bilateral upper trap and splenius cervicis/capitus  ?Needle size  and number: .30x58m x 6 ?Electrical stimulation performed: No ?Parameters: N/A ?Treatment response/outcome: Twitch response elicited, Palpable decrease in muscle tension, and patient reported improved tightness with cervical motion  ?Post-treatment instructions: Patient instructed to expect possible mild to moderate muscle soreness later today and/or tomorrow. Patient instructed in methods to reduce muscle soreness and to continue prescribed HEP. If patient was dry needled over the lung field, patient was instructed on signs and symptoms of pneumothorax and, however unlikely, to see immediate medical attention should they occur. Patient was also educated on signs and symptoms of infection and to seek medical attention should they occur. Patient verbalized understanding of these instructions and education. ? ? ?09/13/2021: ? Therapeutic Exercise: ?NuStep L5 x 5 min  with UE/LE while taking subjective ?LTR 10 x 5 sec each ?Sidelying thoracic rotation 10 x 5 sec each ?Upper trap stretch 2 x 20 sec each ?Standing bent over step back stretch at counter 10 x 5 sec ?Seated cervi

## 2021-09-29 ENCOUNTER — Other Ambulatory Visit: Payer: Self-pay

## 2021-09-29 ENCOUNTER — Encounter: Payer: Self-pay | Admitting: Family Medicine

## 2021-09-29 ENCOUNTER — Ambulatory Visit: Payer: Medicaid Other | Attending: Family Medicine | Admitting: Family Medicine

## 2021-09-29 VITALS — BP 123/81 | HR 94 | Ht 73.0 in

## 2021-09-29 DIAGNOSIS — Z8679 Personal history of other diseases of the circulatory system: Secondary | ICD-10-CM | POA: Diagnosis not present

## 2021-09-29 DIAGNOSIS — M549 Dorsalgia, unspecified: Secondary | ICD-10-CM | POA: Insufficient documentation

## 2021-09-29 DIAGNOSIS — E1169 Type 2 diabetes mellitus with other specified complication: Secondary | ICD-10-CM | POA: Insufficient documentation

## 2021-09-29 DIAGNOSIS — I480 Paroxysmal atrial fibrillation: Secondary | ICD-10-CM | POA: Diagnosis not present

## 2021-09-29 DIAGNOSIS — I10 Essential (primary) hypertension: Secondary | ICD-10-CM | POA: Insufficient documentation

## 2021-09-29 DIAGNOSIS — M5116 Intervertebral disc disorders with radiculopathy, lumbar region: Secondary | ICD-10-CM | POA: Diagnosis not present

## 2021-09-29 DIAGNOSIS — H0510 Unspecified chronic inflammatory disorders of orbit: Secondary | ICD-10-CM | POA: Diagnosis not present

## 2021-09-29 DIAGNOSIS — Z79899 Other long term (current) drug therapy: Secondary | ICD-10-CM | POA: Insufficient documentation

## 2021-09-29 DIAGNOSIS — Z713 Dietary counseling and surveillance: Secondary | ICD-10-CM | POA: Insufficient documentation

## 2021-09-29 DIAGNOSIS — Z7901 Long term (current) use of anticoagulants: Secondary | ICD-10-CM | POA: Diagnosis not present

## 2021-09-29 DIAGNOSIS — Z79631 Long term (current) use of antimetabolite agent: Secondary | ICD-10-CM | POA: Insufficient documentation

## 2021-09-29 DIAGNOSIS — Z125 Encounter for screening for malignant neoplasm of prostate: Secondary | ICD-10-CM | POA: Diagnosis not present

## 2021-09-29 DIAGNOSIS — J329 Chronic sinusitis, unspecified: Secondary | ICD-10-CM | POA: Diagnosis not present

## 2021-09-29 DIAGNOSIS — J4520 Mild intermittent asthma, uncomplicated: Secondary | ICD-10-CM | POA: Diagnosis not present

## 2021-09-29 DIAGNOSIS — J3089 Other allergic rhinitis: Secondary | ICD-10-CM

## 2021-09-29 DIAGNOSIS — F319 Bipolar disorder, unspecified: Secondary | ICD-10-CM | POA: Insufficient documentation

## 2021-09-29 DIAGNOSIS — M5416 Radiculopathy, lumbar region: Secondary | ICD-10-CM

## 2021-09-29 DIAGNOSIS — Z809 Family history of malignant neoplasm, unspecified: Secondary | ICD-10-CM | POA: Diagnosis not present

## 2021-09-29 DIAGNOSIS — H44113 Panuveitis, bilateral: Secondary | ICD-10-CM | POA: Diagnosis not present

## 2021-09-29 LAB — POCT GLYCOSYLATED HEMOGLOBIN (HGB A1C): HbA1c, POC (controlled diabetic range): 5.8 % (ref 0.0–7.0)

## 2021-09-29 MED ORDER — PREDNISONE 20 MG PO TABS: 20.0000 mg | ORAL_TABLET | Freq: Every day | ORAL | 0 refills | Status: DC

## 2021-09-29 MED ORDER — ATORVASTATIN CALCIUM 40 MG PO TABS: 40.0000 mg | ORAL_TABLET | Freq: Every day | ORAL | 1 refills | Status: DC

## 2021-09-29 MED ORDER — OMEPRAZOLE 20 MG PO CPDR: DELAYED_RELEASE_CAPSULE | ORAL | 1 refills | Status: DC

## 2021-09-29 NOTE — Progress Notes (Signed)
? ?Subjective:  ?Patient ID: Jason Stokes, male    DOB: 1961-04-03  Age: 61 y.o. MRN: [id number] ? ?CC: Back Pain ? ? ?HPI ?Jason Stokes is a 61 y.o. year old male with a history of hypertension, type 2 diabetes mellitus (diet-controlled A1c 6.4), bipolar disorder, paroxysmal A. 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 ? ?Interval History: ?He saw orthopedic yesterday and had R knee arthrocentesis performed and he is feeling better after the procedure. He is trying to put off R TKA as long as he can. ? ?He is undergoing physical therapy for his neck and his lower back and received acupuncture which helped his neck pain but his back pain is still uncontrolled.  His orthopedic would like him to take OTC analgesics which he says has been ineffective. ? ?He complains that his ' sinuses are acting up and he needs an antibiotic'.  He currently receives immunotherapy from the allergy clinic with his last visit last week.  Endorses difficulty sleeping at night due to nasal congestion and sinus pressure.  He has no fever.  States he would like to 'catch it before it gets worse'. ? ?He would like to be screened for prostate cancer as he does have a family history of cancer. ?I had referred him for colonoscopy to [facility] but he informs me he was told he needed to go obtain his previous colonoscopy records from another practice.  Review of his chart indicates the patient had informed the referral coordinator that he previously went to [facility] and that is why he was referred. ?Past Medical History:  ?Diagnosis Date  ? Anxiety   ? Bilateral carpal tunnel syndrome 01/10/2018  ? Bipolar disorder ([place])   ? Chronic lower back pain   ? Depression   ? Dysrhythmia   ? a-fib  ? GERD (gastroesophageal reflux disease)   ? History of alcohol abuse   ? History of nuclear stress test   ?  Myoview 10/16: EF 50%, diaphragmatic attenuation, no ischemia, low risk  ? Hypertension   ? Migraine 2012-2014  ? Moderate persistent asthma with acute exacerbation 05/02/2018  ? PAF (paroxysmal atrial fibrillation) ([place]) 03/27/2015  ? a. Myoview neg for ischemia >> Flecainide started 10/16 >> FU ETT   ? Schizophrenia ([place])   ? Sciatica neuralgia   ? Small vessel disease ([place])   ? Right basal ganglia stroke  ? Stroke [facility]) "between 2012-2014"  ? residual "AF" (09/22/2015)  ? ? ?Past Surgical History:  ?Procedure Laterality Date  ? ATRIAL FIBRILLATION ABLATION  09/22/2015  ? CYST EXCISION  1996-97  ? surgery back of head   ? ELECTROPHYSIOLOGIC STUDY N/A 09/22/2015  ? Procedure: Atrial Fibrillation Ablation;  Surgeon: Will [name], MD;  Location: [place] [facility];  Service: Cardiovascular;  Laterality: N/A;  ? ELECTROPHYSIOLOGIC STUDY N/A 12/10/2015  ? Procedure: Atrial Fibrillation Ablation;  Surgeon: Will [name], MD;  Location: [place] [facility];  Service: Cardiovascular;  Laterality: N/A;  ? ELECTROPHYSIOLOGIC STUDY N/A 12/11/2015  ? Procedure: Cardioversion;  Surgeon: Will [name], MD;  Location: [place] [facility];  Service: Cardiovascular;  Laterality: N/A;  ? EMBOLIZATION Right 08/19/2020  ? Procedure: EMBOLIZATION;  Surgeon: Cherre Robins, MD;  Location: [place] [facility];  Service: Cardiovascular;  Laterality: Right;  hypogastric  ? EXCISION MASS HEAD N/A 01/06/2017  ? Procedure: EXCISION MASS FOREHEAD;  Surgeon: Irene Limbo, MD;  Location: [place];  Service: Plastics;  Laterality: N/A;  ? GANGLION CYST EXCISION Left   ? INTERCOSTAL NERVE BLOCK  2005  ? KNEE ARTHROSCOPY Right 2016  ? MASS EXCISION N/A 01/25/2021  ? Procedure: EXCISION SUBCUTANEOUS VS SUBFASCIAL MASS TORSO 3CM;  Surgeon: Irene Limbo, MD;  Location: [place];  Service: Plastics;  Laterality: N/A;  ? ? ?Family History  ?Problem Relation Age of Onset  ? Heart disease Father   ?  Schizophrenia Sister   ? ? ?Social History  ? ?Socioeconomic History  ? Marital status: Single  ?  Spouse name: [name]  ? Number of children: 3  ? Years of education: HS  ? Highest education level: Not on file  ?Occupational History  ?  Comment: disabled  ?Tobacco Use  ? Smoking status: Every Day  ?  Years: 29.00  ?  Types: Cigarettes  ? Smokeless tobacco: Never  ? Tobacco comments:  ?  1 cigarettes every day  06-02-2021  ?Vaping Use  ? Vaping Use: Never used  ?Substance and Sexual Activity  ? Alcohol use: Yes  ?  Alcohol/week: 2.0 standard drinks  ?  Types: 2 Standard drinks or equivalent per week  ?  Comment: 27mxed drinks  ? Drug use: Yes  ?  Types: Marijuana  ?  Comment: smoked marijuana 01-02-17 for pain  ? Sexual activity: Not Currently  ?Other Topics Concern  ? Not on file  ?Social History Narrative  ? [place]   ? Patient has 3 children 2 step.   ? Patient has 13 years of schooling.   ? Patient is right handed.   ? ?Social Determinants of Health  ? ?Financial Resource Strain: Not on file  ?Food Insecurity: Not on file  ?Transportation Needs: Not on file  ?Physical Activity: Not on file  ?Stress: Not on file  ?Social Connections: Not on file  ? ? ?Allergies  ?Allergen Reactions  ? Lisinopril Anaphylaxis  ?  Swelling of lips and tongue.  ? ? ?Outpatient Medications Prior to Visit  ?Medication Sig Dispense Refill  ? Accu-Chek Softclix Lancets lancets Use as instructed 3 times daily before meals 100 each 12  ? ADVAIR DISKUS 500-50 MCG/ACT AEPB Inhale 1 puff into the lungs daily.    ? albuterol (VENTOLIN HFA) 108 (90 Base) MCG/ACT inhaler Inhale 2 puffs into the lungs every 4 (four) hours as needed for wheezing or shortness of breath. 54 g 0  ? ALPHAGAN P 0.1 % SOLN Place 1 drop into the left eye 2 (two) times daily.    ? azelastine (ASTELIN) 0.1 % nasal spray Place 2 sprays into both nostrils 2 (two) times daily. Use in each nostril as directed 30 mL 5  ? Azelastine HCl 0.15 % SOLN  Place 1 spray into both nostrils 2 (two) times daily. 30 mL 5  ? baclofen (LIORESAL) 10 MG tablet Take 10 mg by mouth 3 (three) times daily as needed for muscle pain.    ? Blood Glucose Monitoring Suppl (ACCU-CHEK AVIVA PLUS) w/Device KIT Use 3 times daily before meals 1 kit 0  ? carvedilol (COREG) 25 MG tablet TAKE 1 TABLET BY MOUTH TWICE A DAY 180 tablet 2  ? cetirizine (ZYRTEC) 10 MG tablet Take 1 tablet (10 mg total) by mouth daily. 60 tablet 5  ? CYMBALTA 30 MG capsule Take 30 mg by mouth at bedtime.    ? diltiazem (CARDIZEM CD) 180  MG 24 hr capsule TAKE 1 CAPSULE BY MOUTH EVERY DAY 90 capsule 1  ? dronedarone (MULTAQ) 400 MG tablet Take 1 tablet (400 mg total) by mouth 2 (two) times daily with a meal. 180 tablet 3  ? ELIQUIS 5 MG TABS tablet TAKE 1 TABLET BY MOUTH TWICE A DAY 60 tablet 6  ? fluticasone (FLONASE) 50 MCG/ACT nasal spray Place 1 spray into both nostrils 2 (two) times daily. 16 g 5  ? fluticasone furoate-vilanterol (BREO ELLIPTA) 200-25 MCG/ACT AEPB Inhale 1 puff into the lungs daily. 28 each 2  ? folic acid (FOLVITE) 1 MG tablet Take 1 mg by mouth daily.    ? furosemide (LASIX) 20 MG tablet Take 1 tablet daily, as needed, for swelling 30 tablet 1  ? GARLIC PO Take 1 tablet by mouth daily as needed (To build immune system up).    ? glucose blood (ACCU-CHEK AVIVA PLUS) test strip Use as instructed 3 times daily before meals 100 each 12  ? hydrALAZINE (APRESOLINE) 100 MG tablet TAKE 1 TABLET (100 MG TOTAL) BY MOUTH 3 (THREE) TIMES DAILY. 270 tablet 2  ? methocarbamol (ROBAXIN) 500 MG tablet Take 1 tablet (500 mg total) by mouth every 8 (eight) hours as needed for muscle spasms. 30 tablet 1  ? methotrexate (RHEUMATREX) 2.5 MG tablet Take 2.5 mg by mouth daily.    ? Misc. Devices MISC Back brace. Diagnosis chronic back pain 1 each 0  ? Misc. Devices MISC Left knee brace. Diagnosis L knee pain 1 each 0  ? montelukast (SINGULAIR) 10 MG tablet Take 1 tablet (10 mg total) by mouth at bedtime. 30 tablet 5   ? naloxone (NARCAN) nasal spray 4 mg/0.1 mL Place 1 spray into the nose daily as needed for opioid reversal.    ? Olopatadine HCl (PATADAY) 0.2 % SOLN Place 1 drop into both eyes daily as needed. 2.5 mL 5  ? Ox

## 2021-09-29 NOTE — Patient Instructions (Addendum)
[facility] Ph# [phone] #[phone] ?[address] [phone]  ?

## 2021-09-30 ENCOUNTER — Telehealth: Payer: Self-pay | Admitting: Physical Therapy

## 2021-09-30 ENCOUNTER — Ambulatory Visit: Payer: Medicaid Other | Attending: Family Medicine | Admitting: Physical Therapy

## 2021-09-30 ENCOUNTER — Ambulatory Visit (INDEPENDENT_AMBULATORY_CARE_PROVIDER_SITE_OTHER): Payer: Medicaid Other

## 2021-09-30 DIAGNOSIS — G8929 Other chronic pain: Secondary | ICD-10-CM | POA: Insufficient documentation

## 2021-09-30 DIAGNOSIS — M545 Low back pain, unspecified: Secondary | ICD-10-CM | POA: Insufficient documentation

## 2021-09-30 DIAGNOSIS — J309 Allergic rhinitis, unspecified: Secondary | ICD-10-CM

## 2021-09-30 DIAGNOSIS — I480 Paroxysmal atrial fibrillation: Secondary | ICD-10-CM | POA: Diagnosis not present

## 2021-09-30 DIAGNOSIS — M542 Cervicalgia: Secondary | ICD-10-CM | POA: Insufficient documentation

## 2021-09-30 DIAGNOSIS — M6281 Muscle weakness (generalized): Secondary | ICD-10-CM | POA: Insufficient documentation

## 2021-09-30 LAB — PSA, TOTAL AND FREE
PSA, Free Pct: 20 %
PSA, Free: 0.16 ng/mL
Prostate Specific Ag, Serum: 0.8 ng/mL (ref 0.0–4.0)

## 2021-09-30 LAB — CMP14+EGFR
ALT: 17 IU/L (ref 0–44)
AST: 11 IU/L (ref 0–40)
Albumin/Globulin Ratio: 1.7 (ref 1.2–2.2)
Albumin: 4.4 g/dL (ref 3.8–4.9)
Alkaline Phosphatase: 80 IU/L (ref 44–121)
BUN/Creatinine Ratio: 13 (ref 10–24)
BUN: 15 mg/dL (ref 8–27)
Bilirubin Total: 0.5 mg/dL (ref 0.0–1.2)
CO2: 22 mmol/L (ref 20–29)
Calcium: 9.3 mg/dL (ref 8.6–10.2)
Chloride: 103 mmol/L (ref 96–106)
Creatinine, Ser: 1.19 mg/dL (ref 0.76–1.27)
Globulin, Total: 2.6 g/dL (ref 1.5–4.5)
Glucose: 136 mg/dL — ABNORMAL HIGH (ref 70–99)
Potassium: 3.7 mmol/L (ref 3.5–5.2)
Sodium: 140 mmol/L (ref 134–144)
Total Protein: 7 g/dL (ref 6.0–8.5)
eGFR: 70 mL/min/{1.73_m2} (ref >59)

## 2021-09-30 NOTE — Therapy (Signed)
?OUTPATIENT PHYSICAL THERAPY TREATMENT NOTE ? ? ?Patient Name: Jason Stokes ?MRN: [id number] ?DOB:[birth year], 61 y.o., male ?Today's Date: 10/01/2021 ? ?PCP: Charlott Rakes, MD ?REFERRING PROVIDER: Charlott Rakes, MD ? ? PT End of Session - 10/01/21 0836   ? ? Visit Number 5   ? Number of Visits 8   ? Date for PT Re-Evaluation 10/21/21   ? Authorization Type Med [employer] / [facility] [place]   ? Authorization Time Period 09/02/2021 - 09/22/2021   ? Authorization - Visit Number 3   ? Authorization - Number of Visits 3   ? PT Start Time [phone]   ? PT Stop Time 1937   ? PT Time Calculation (min) 40 min   ? Activity Tolerance Patient limited by pain   ? Behavior During Therapy [facility] for tasks assessed/performed   ? ?  ?  ? ?  ? ? ? ? ? ?Past Medical History:  ?Diagnosis Date  ? Anxiety   ? Bilateral carpal tunnel syndrome 01/10/2018  ? Bipolar disorder ([place])   ? Chronic lower back pain   ? Depression   ? Dysrhythmia   ? a-fib  ? GERD (gastroesophageal reflux disease)   ? History of alcohol abuse   ? History of nuclear stress test   ? Myoview 10/16: EF 50%, diaphragmatic attenuation, no ischemia, low risk  ? Hypertension   ? Migraine 2012-2014  ? Moderate persistent asthma with acute exacerbation 05/02/2018  ? PAF (paroxysmal atrial fibrillation) ([place]) 03/27/2015  ? a. Myoview neg for ischemia >> Flecainide started 10/16 >> FU ETT   ? Schizophrenia ([place])   ? Sciatica neuralgia   ? Small vessel disease ([place])   ? Right basal ganglia stroke  ? Stroke [facility]) "between 2012-2014"  ? residual "AF" (09/22/2015)  ? ?Past Surgical History:  ?Procedure Laterality Date  ? ATRIAL FIBRILLATION ABLATION  09/22/2015  ? CYST EXCISION  1996-97  ? surgery back of head   ? ELECTROPHYSIOLOGIC STUDY N/A 09/22/2015  ? Procedure: Atrial Fibrillation Ablation;  Surgeon: Will [name], MD;  Location: [place] [facility];  Service: Cardiovascular;  Laterality: N/A;  ? ELECTROPHYSIOLOGIC STUDY N/A 12/10/2015  ? Procedure: Atrial Fibrillation Ablation;  Surgeon: Will [name], MD;  Location: [place] [facility];  Service: Cardiovascular;  Laterality: N/A;  ? ELECTROPHYSIOLOGIC STUDY N/A 12/11/2015  ? Procedure: Cardioversion;  Surgeon: Will [name], MD;  Location: [place] [facility];  Service: Cardiovascular;  Laterality: N/A;  ? EMBOLIZATION Right 08/19/2020  ? Procedure: EMBOLIZATION;  Surgeon: Cherre Robins, MD;  Location: [place] [facility];  Service: Cardiovascular;  Laterality: Right;  hypogastric  ? EXCISION MASS HEAD N/A 01/06/2017  ? Procedure: EXCISION MASS FOREHEAD;  Surgeon: Irene Limbo, MD;  Location: [place];  Service: Plastics;  Laterality: N/A;  ? GANGLION CYST EXCISION Left   ? INTERCOSTAL NERVE BLOCK  2005  ? KNEE ARTHROSCOPY Right 2016  ? MASS EXCISION N/A 01/25/2021  ? Procedure: EXCISION SUBCUTANEOUS VS SUBFASCIAL MASS TORSO 3CM;  Surgeon: Irene Limbo, MD;  Location: [place];  Service: Plastics;  Laterality: N/A;  ? ?Patient Active Problem List  ? Diagnosis Date Noted  ? Secondary hypercoagulable state ([place]) 03/23/2021  ? Type 2 diabetes mellitus with hyperglycemia, without long-term current use of insulin ([place]) 02/10/2021  ? Neck pain on left side 03/06/2019  ? Musculoskeletal chest pain 07/24/2018  ? Asthma, mild intermittent 05/02/2018  ? Allergic rhinitis caused by mold 05/02/2018  ? Tobacco use  05/02/2018  ? Bilateral carpal tunnel syndrome 01/10/2018  ? Stroke [facility])   ? Schizophrenia ([place])   ? Migraine   ? History of nuclear stress test   ? History of alcohol abuse   ? GERD (gastroesophageal reflux disease)   ? Dysrhythmia   ? Chronic lower back pain   ? Anxiety   ? Arthritis of knee, right 04/12/2017  ? Bipolar disorder ([place]) 04/03/2017  ? Lipoma of forehead 09/22/2016  ? Trigger ring finger of right hand 06/22/2016  ? Paroxysmal atrial fibrillation ([facility])   ? AF (atrial fibrillation) ([place]) 12/10/2015  ? Eczema 07/10/2015  ? History of CVA (cerebrovascular accident) 05/04/2015  ? Tear of medial  meniscus of right knee 03/18/2015  ? Chronic pain of right knee 03/12/2015  ? Other and unspecified hyperlipidemia 11/25/2013  ? Nasal congestion 11/25/2013  ? Sinusitis, chronic 05/09/2013  ? Chronic low back pain 10/12/2012  ? Lumbar radiculopathy 10/12/2012  ? Hypertension 10/12/2012  ? Depression 10/12/2012  ? Insomnia 10/12/2012  ? ? ?REFERRING DIAG: Chronic midline low back pain with right-sided sciatica ? ?THERAPY DIAG:  ?Chronic bilateral low back pain, unspecified whether sciatica present ? ?Cervicalgia ? ?Muscle weakness (generalized) ? ?PERTINENT HISTORY: Chronic pain, bipolar, HTN, history of CVA, depression/anxiety, Type II DM ? ?PRECAUTIONS: fall ? ?SUBJECTIVE: Felt DN was helpful as it loosened up his neck, wishes his low back felt as good.  Bothered by allergies today. ? ?PAIN:  ?Are you having pain? Yes ?NPRS scale: 8/10 ?Pain location: Neck ?Pain orientation: Right ?PAIN TYPE: Chronic ?Pain description: Constant, tight, pressure, sharp ?Aggravating factors: Moving neck too much to quickly  ?Relieving factors: Medication ? ?Are you having pain? Yes ?NPRS scale: 6/10 ?Pain location: Back ?Pain orientation: Lower, right ?PAIN TYPE: Chronic ?Pain description: Pressure, sharp ?Aggravating factors: Sitting extended periods, avoids bending, walking ?Relieving factors: Medication ? ? ?OBJECTIVE:  ?PATIENT SURVEYS:  ?Modified Oswestry 30/50  ?  ?SENSATION: ?Light touch: Deficits patient reports generalized sensation deficit RLE  ?  ?MUSCLE LENGTH: ?Hamstring significant limitation bilaterally, when assessing right side patient began having spasm of right leg that resolved after a few seconds of patient relaxing ?  ?POSTURE:  ?Patient exhibits rounded shoulder and forward head posture, shoulders in elevated/tensed position, he demonstrates trunk lean to right and consistently shift weight due to pain ?  ?PALPATION: ?Generalized tenderness cervical and lumbar paraspinals, bilateral upper trap region  ?   ?LUMBAR AROM ?  ?AROM A/PROM  ?08/26/2021  ?Flexion <25%  ?Extension 0%  ?Right lateral flexion 25%  ?Left lateral flexion 25%  ?Right rotation 25%  ?Left rotation 25%  ?  ?Cervical AROM: ?  ?AROM A/PROM  ?08/26/2021  ?09/07/2021  ?09/13/2021  ?Flexion 15  20  ?Extension 20  30  ?Right lateral flexion 15  20  ?Left lateral flexion 20  30  ?Right rotation 20 30 35  ?Left rotation         25         30         40  ?  ?LE MMT: ?  ?MMT Right ?08/26/2021 Left ?08/26/2021 Right / Left ?09/13/2021  ?Hip flexion 4- 4- 4- / 4-  ?Hip extension 3- 3- 3 / 3  ?Hip abduction 3- 3- 3 / 3  ?Knee flexion '4 4 4 '$ / 4  ?Knee extension '4 4 4 '$ / 4  ?Gross periscapular musculature 4- 4-  ? ?GAIT: ?Assistive device utilized: Single point cane ?Level of assistance: Modified independence ?Comments:  Cane used on right, antalgic gait on right, decreased gait speed ?  ?  ?TODAY'S TREATMENT  ?[facility] Adult PT Treatment:                                                DATE: 10/01/21 ?Therapeutic Exercise: ?NuStep L4 x 8 min with UE/LE ?Cervical R rotation towel stretch 10x ?Cervical extension with towel bracing 10x ?LTR 10 x 5 sec each ?SL open book 10x B ?Upper trap stretch 2 x 20 sec each ?Seated lumbar stretch to chair in front 5s 10x ?Seated cervical retraction 10 x 5 sec ?Manual Therapy: ?Manual UT stretch on R 30s x3 ?Suboccipital release with gentle manual traction 6 min ? ?09/13/2021: ? Therapeutic Exercise: ?NuStep L5 x 5 min with UE/LE while taking subjective ?LTR 10 x 5 sec each ?Sidelying thoracic rotation 10 x 5 sec each ?Upper trap stretch 2 x 20 sec each ?Standing bent over step back stretch at counter 10 x 5 sec ?Seated cervical retraction 10 x 5 sec ?Manual: ?Skilled palpation and monitoring of muscular tension while performing TPDN treatment ?STM for bilateral upper trap regions ?Suboccipital release with gentle manual traction x 3 bouts ?Trigger Point Dry Needling Treatment: ?Pre-treatment instruction: Patient instructed on dry needling  rationale, procedures, and possible side effects including pain during treatment (achy,cramping feeling), bruising, drop of blood, lightheadedness, nausea, sweating. ?Patient Consent Given: Yes ?Education handout provide

## 2021-09-30 NOTE — Telephone Encounter (Signed)
Patient arrived to the clinic 35 minutes after his scheduled PT appointment. He was informed it would be considered a no-show and was rescheduled for 10/01/2021. He was informed of attendance policy. Patient expressed understanding. ? ?Hilda Blades, PT, DPT, LAT, ATC ?09/30/21  10:49 AM ?Phone: [phone] ?Fax: [phone] ? ?

## 2021-10-01 ENCOUNTER — Other Ambulatory Visit: Payer: Self-pay

## 2021-10-01 ENCOUNTER — Ambulatory Visit: Payer: Medicaid Other

## 2021-10-01 DIAGNOSIS — M545 Low back pain, unspecified: Secondary | ICD-10-CM

## 2021-10-01 DIAGNOSIS — G8929 Other chronic pain: Secondary | ICD-10-CM | POA: Diagnosis not present

## 2021-10-01 DIAGNOSIS — M542 Cervicalgia: Secondary | ICD-10-CM | POA: Diagnosis not present

## 2021-10-01 DIAGNOSIS — M6281 Muscle weakness (generalized): Secondary | ICD-10-CM | POA: Diagnosis not present

## 2021-10-01 DIAGNOSIS — I480 Paroxysmal atrial fibrillation: Secondary | ICD-10-CM | POA: Diagnosis not present

## 2021-10-02 DIAGNOSIS — I480 Paroxysmal atrial fibrillation: Secondary | ICD-10-CM | POA: Diagnosis not present

## 2021-10-03 DIAGNOSIS — I480 Paroxysmal atrial fibrillation: Secondary | ICD-10-CM | POA: Diagnosis not present

## 2021-10-04 DIAGNOSIS — I480 Paroxysmal atrial fibrillation: Secondary | ICD-10-CM | POA: Diagnosis not present

## 2021-10-05 DIAGNOSIS — I480 Paroxysmal atrial fibrillation: Secondary | ICD-10-CM | POA: Diagnosis not present

## 2021-10-05 NOTE — Therapy (Incomplete)
?OUTPATIENT PHYSICAL THERAPY TREATMENT NOTE ? ? ?Patient Name: Jason Stokes ?MRN: [id number] ?DOB:28-Mar-1961, 61 y.o., male ?Today's Date: 10/05/2021 ? ?PCP: Charlott Rakes, MD ?REFERRING PROVIDER: Charlott Rakes, MD ? ? ? ? ? ? ? ?Past Medical History:  ?Diagnosis Date  ? Anxiety   ? Bilateral carpal tunnel syndrome 01/10/2018  ? Bipolar disorder ([place])   ? Chronic lower back pain   ? Depression   ? Dysrhythmia   ? a-fib  ? GERD (gastroesophageal reflux disease)   ? History of alcohol abuse   ? History of nuclear stress test   ? Myoview 10/16: EF 50%, diaphragmatic attenuation, no ischemia, low risk  ? Hypertension   ? Migraine 2012-2014  ? Moderate persistent asthma with acute exacerbation 05/02/2018  ? PAF (paroxysmal atrial fibrillation) ([place]) 03/27/2015  ? a. Myoview neg for ischemia >> Flecainide started 10/16 >> FU ETT   ? Schizophrenia ([place])   ? Sciatica neuralgia   ? Small vessel disease ([place])   ? Right basal ganglia stroke  ? Stroke [facility]) "between 2012-2014"  ? residual "AF" (09/22/2015)  ? ?Past Surgical History:  ?Procedure Laterality Date  ? ATRIAL FIBRILLATION ABLATION  09/22/2015  ? CYST EXCISION  1996-97  ? surgery back of head   ? ELECTROPHYSIOLOGIC STUDY N/A 09/22/2015  ? Procedure: Atrial Fibrillation Ablation;  Surgeon: Will [name], MD;  Location: [place] [facility];  Service: Cardiovascular;  Laterality: N/A;  ? ELECTROPHYSIOLOGIC STUDY N/A 12/10/2015  ? Procedure: Atrial Fibrillation Ablation;  Surgeon: Will [name], MD;  Location: [place] [facility];  Service: Cardiovascular;  Laterality: N/A;  ? ELECTROPHYSIOLOGIC STUDY N/A 12/11/2015  ? Procedure: Cardioversion;  Surgeon: Will [name], MD;  Location: [place] [facility];  Service: Cardiovascular;  Laterality: N/A;  ? EMBOLIZATION Right 08/19/2020  ? Procedure: EMBOLIZATION;  Surgeon: Cherre Robins, MD;  Location: [place] [facility];  Service: Cardiovascular;  Laterality: Right;  hypogastric  ? EXCISION MASS HEAD N/A 01/06/2017   ? Procedure: EXCISION MASS FOREHEAD;  Surgeon: Irene Limbo, MD;  Location: [place];  Service: Plastics;  Laterality: N/A;  ? GANGLION CYST EXCISION Left   ? INTERCOSTAL NERVE BLOCK  2005  ? KNEE ARTHROSCOPY Right 2016  ? MASS EXCISION N/A 01/25/2021  ? Procedure: EXCISION SUBCUTANEOUS VS SUBFASCIAL MASS TORSO 3CM;  Surgeon: Irene Limbo, MD;  Location: [place];  Service: Plastics;  Laterality: N/A;  ? ?Patient Active Problem List  ? Diagnosis Date Noted  ? Secondary hypercoagulable state ([place]) 03/23/2021  ? Type 2 diabetes mellitus with hyperglycemia, without long-term current use of insulin ([place]) 02/10/2021  ? Neck pain on left side 03/06/2019  ? Musculoskeletal chest pain 07/24/2018  ? Asthma, mild intermittent 05/02/2018  ? Allergic rhinitis caused by mold 05/02/2018  ? Tobacco use 05/02/2018  ? Bilateral carpal tunnel syndrome 01/10/2018  ? Stroke [facility])   ? Schizophrenia ([place])   ? Migraine   ? History of nuclear stress test   ? History of alcohol abuse   ? GERD (gastroesophageal reflux disease)   ? Dysrhythmia   ? Chronic lower back pain   ? Anxiety   ? Arthritis of knee, right 04/12/2017  ? Bipolar disorder ([place]) 04/03/2017  ? Lipoma of forehead 09/22/2016  ? Trigger ring finger of right hand 06/22/2016  ? Paroxysmal atrial fibrillation ([facility])   ? AF (atrial fibrillation) ([place]) 12/10/2015  ? Eczema 07/10/2015  ? History of CVA (cerebrovascular accident) 05/04/2015  ? Tear of  medial meniscus of right knee 03/18/2015  ? Chronic pain of right knee 03/12/2015  ? Other and unspecified hyperlipidemia 11/25/2013  ? Nasal congestion 11/25/2013  ? Sinusitis, chronic 05/09/2013  ? Chronic low back pain 10/12/2012  ? Lumbar radiculopathy 10/12/2012  ? Hypertension 10/12/2012  ? Depression 10/12/2012  ? Insomnia 10/12/2012  ? ? ?REFERRING DIAG: Chronic midline low back pain with right-sided sciatica ? ?THERAPY DIAG:  ?No diagnosis found. ? ?PERTINENT HISTORY: Chronic pain,  bipolar, HTN, history of CVA, depression/anxiety, Type II DM ? ?PRECAUTIONS: Fall ? ?SUBJECTIVE: Felt DN was helpful as it loosened up his neck, wishes his low back felt as good.  Bothered by allergies today. ? ?PAIN:  ?Are you having pain? Yes ?NPRS scale: 8/10 ?Pain location: Neck ?Pain orientation: Right ?PAIN TYPE: Chronic ?Pain description: Constant, tight, pressure, sharp ?Aggravating factors: Moving neck too much to quickly  ?Relieving factors: Medication ? ?Are you having pain? Yes ?NPRS scale: 6/10 ?Pain location: Back ?Pain orientation: Lower, right ?PAIN TYPE: Chronic ?Pain description: Pressure, sharp ?Aggravating factors: Sitting extended periods, avoids bending, walking ?Relieving factors: Medication ? ? ?OBJECTIVE:  ?PATIENT SURVEYS:  ?Modified Oswestry 30/50 - 09/13/2021 ?  ?SENSATION: ?Light touch: Deficits patient reports generalized sensation deficit RLE  ?  ?MUSCLE LENGTH: ?Hamstring significant limitation bilaterally, when assessing right side patient began having spasm of right leg that resolved after a few seconds of patient relaxing ?  ?POSTURE:  ?Patient exhibits rounded shoulder and forward head posture, shoulders in elevated/tensed position, he demonstrates trunk lean to right and consistently shift weight due to pain ?  ?PALPATION: ?Generalized tenderness cervical and lumbar paraspinals, bilateral upper trap region  ?  ?LUMBAR AROM ?  ?AROM A/PROM  ?08/26/2021  ?Flexion <25%  ?Extension 0%  ?Right lateral flexion 25%  ?Left lateral flexion 25%  ?Right rotation 25%  ?Left rotation 25%  ?  ?Cervical AROM: ?  ?AROM A/PROM  ?08/26/2021  ?09/07/2021  ?09/13/2021  ?Flexion 15  20  ?Extension 20  30  ?Right lateral flexion 15  20  ?Left lateral flexion 20  30  ?Right rotation 20 30 35  ?Left rotation         25         30         40  ?  ?LE MMT: ?  ?MMT Right ?08/26/2021 Left ?08/26/2021 Right / Left ?09/13/2021  ?Hip flexion 4- 4- 4- / 4-  ?Hip extension 3- 3- 3 / 3  ?Hip abduction 3- 3- 3 / 3  ?Knee  flexion '4 4 4 '$ / 4  ?Knee extension '4 4 4 '$ / 4  ?Gross periscapular musculature 4- 4-  ? ?GAIT: ?Assistive device utilized: Single point cane ?Level of assistance: Modified independence ?Comments: Cane used on right, antalgic gait on right, decreased gait speed ?  ?  ?TODAY'S TREATMENT  ?[facility] Adult PT Treatment:                                                DATE: 10/07/21 ?Therapeutic Exercise: ?NuStep L5 x 5 min with UE/LE while taking subjective ?LTR 10 x 5 sec each ?Sidelying thoracic rotation 10 x 5 sec each ?Upper trap stretch 2 x 20 sec each ?Standing bent over step back stretch at counter 10 x 5 sec ?Seated cervical retraction 10 x 5 sec ?Manual: ?Skilled palpation and  monitoring of muscular tension while performing TPDN treatment ?STM for bilateral upper trap regions ?Suboccipital release with gentle manual traction x 3 bouts ?Trigger Point Dry Needling Treatment: ?Pre-treatment instruction: Patient instructed on dry needling rationale, procedures, and possible side effects including pain during treatment (achy,cramping feeling), bruising, drop of blood, lightheadedness, nausea, sweating. ?Patient Consent Given: Yes ?Education handout provided: No ?Muscles treated: Bilateral upper trap and splenius cervicis/capitus  ?Needle size and number: .30x32m x 6 ?Electrical stimulation performed: No ?Parameters: N/A ?Treatment response/outcome: Twitch response elicited, Palpable decrease in muscle tension, and patient reported improved tightness with cervical motion  ?Post-treatment instructions: Patient instructed to expect possible mild to moderate muscle soreness later today and/or tomorrow. Patient instructed in methods to reduce muscle soreness and to continue prescribed HEP. If patient was dry needled over the lung field, patient was instructed on signs and symptoms of pneumothorax and, however unlikely, to see immediate medical attention should they occur. Patient was also educated on signs and symptoms of  infection and to seek medical attention should they occur. Patient verbalized understanding of these instructions and education. ? ?[facility]                                                [facility]: 3/17

## 2021-10-06 DIAGNOSIS — I480 Paroxysmal atrial fibrillation: Secondary | ICD-10-CM | POA: Diagnosis not present

## 2021-10-07 ENCOUNTER — Ambulatory Visit: Payer: Medicaid Other | Admitting: Physical Therapy

## 2021-10-07 DIAGNOSIS — I480 Paroxysmal atrial fibrillation: Secondary | ICD-10-CM | POA: Diagnosis not present

## 2021-10-07 NOTE — Therapy (Incomplete)
?OUTPATIENT PHYSICAL THERAPY TREATMENT NOTE ? ? ?Patient Name: Jason Stokes ?MRN: [id number] ?DOB:May 24, 1961, 61 y.o., male ?Today's Date: 10/07/2021 ? ?PCP: Charlott Rakes, MD ?REFERRING PROVIDER: Charlott Rakes, MD ? ? ? ? ? ? ? ?Past Medical History:  ?Diagnosis Date  ? Anxiety   ? Bilateral carpal tunnel syndrome 01/10/2018  ? Bipolar disorder ([place])   ? Chronic lower back pain   ? Depression   ? Dysrhythmia   ? a-fib  ? GERD (gastroesophageal reflux disease)   ? History of alcohol abuse   ? History of nuclear stress test   ? Myoview 10/16: EF 50%, diaphragmatic attenuation, no ischemia, low risk  ? Hypertension   ? Migraine 2012-2014  ? Moderate persistent asthma with acute exacerbation 05/02/2018  ? PAF (paroxysmal atrial fibrillation) ([place]) 03/27/2015  ? a. Myoview neg for ischemia >> Flecainide started 10/16 >> FU ETT   ? Schizophrenia ([place])   ? Sciatica neuralgia   ? Small vessel disease ([place])   ? Right basal ganglia stroke  ? Stroke [facility]) "between 2012-2014"  ? residual "AF" (09/22/2015)  ? ?Past Surgical History:  ?Procedure Laterality Date  ? ATRIAL FIBRILLATION ABLATION  09/22/2015  ? CYST EXCISION  1996-97  ? surgery back of head   ? ELECTROPHYSIOLOGIC STUDY N/A 09/22/2015  ? Procedure: Atrial Fibrillation Ablation;  Surgeon: Will [name], MD;  Location: [place] [facility];  Service: Cardiovascular;  Laterality: N/A;  ? ELECTROPHYSIOLOGIC STUDY N/A 12/10/2015  ? Procedure: Atrial Fibrillation Ablation;  Surgeon: Will [name], MD;  Location: [place] [facility];  Service: Cardiovascular;  Laterality: N/A;  ? ELECTROPHYSIOLOGIC STUDY N/A 12/11/2015  ? Procedure: Cardioversion;  Surgeon: Will [name], MD;  Location: [place] [facility];  Service: Cardiovascular;  Laterality: N/A;  ? EMBOLIZATION Right 08/19/2020  ? Procedure: EMBOLIZATION;  Surgeon: Cherre Robins, MD;  Location: [place] [facility];  Service: Cardiovascular;  Laterality: Right;  hypogastric  ? EXCISION MASS HEAD N/A 01/06/2017   ? Procedure: EXCISION MASS FOREHEAD;  Surgeon: Irene Limbo, MD;  Location: [place];  Service: Plastics;  Laterality: N/A;  ? GANGLION CYST EXCISION Left   ? INTERCOSTAL NERVE BLOCK  2005  ? KNEE ARTHROSCOPY Right 2016  ? MASS EXCISION N/A 01/25/2021  ? Procedure: EXCISION SUBCUTANEOUS VS SUBFASCIAL MASS TORSO 3CM;  Surgeon: Irene Limbo, MD;  Location: [place];  Service: Plastics;  Laterality: N/A;  ? ?Patient Active Problem List  ? Diagnosis Date Noted  ? Secondary hypercoagulable state ([place]) 03/23/2021  ? Type 2 diabetes mellitus with hyperglycemia, without long-term current use of insulin ([place]) 02/10/2021  ? Neck pain on left side 03/06/2019  ? Musculoskeletal chest pain 07/24/2018  ? Asthma, mild intermittent 05/02/2018  ? Allergic rhinitis caused by mold 05/02/2018  ? Tobacco use 05/02/2018  ? Bilateral carpal tunnel syndrome 01/10/2018  ? Stroke [facility])   ? Schizophrenia ([place])   ? Migraine   ? History of nuclear stress test   ? History of alcohol abuse   ? GERD (gastroesophageal reflux disease)   ? Dysrhythmia   ? Chronic lower back pain   ? Anxiety   ? Arthritis of knee, right 04/12/2017  ? Bipolar disorder ([place]) 04/03/2017  ? Lipoma of forehead 09/22/2016  ? Trigger ring finger of right hand 06/22/2016  ? Paroxysmal atrial fibrillation ([facility])   ? AF (atrial fibrillation) ([place]) 12/10/2015  ? Eczema 07/10/2015  ? History of CVA (cerebrovascular accident) 05/04/2015  ? Tear of  medial meniscus of right knee 03/18/2015  ? Chronic pain of right knee 03/12/2015  ? Other and unspecified hyperlipidemia 11/25/2013  ? Nasal congestion 11/25/2013  ? Sinusitis, chronic 05/09/2013  ? Chronic low back pain 10/12/2012  ? Lumbar radiculopathy 10/12/2012  ? Hypertension 10/12/2012  ? Depression 10/12/2012  ? Insomnia 10/12/2012  ? ? ?REFERRING DIAG: Chronic midline low back pain with right-sided sciatica ? ?THERAPY DIAG:  ?No diagnosis found. ? ?PERTINENT HISTORY: Chronic pain,  bipolar, HTN, history of CVA, depression/anxiety, Type II DM ? ?PRECAUTIONS: Fall ? ?SUBJECTIVE: Felt DN was helpful as it loosened up his neck, wishes his low back felt as good.  Bothered by allergies today. ? ?PAIN:  ?Are you having pain? Yes ?NPRS scale: 8/10 ?Pain location: Neck ?Pain orientation: Right ?PAIN TYPE: Chronic ?Pain description: Constant, tight, pressure, sharp ?Aggravating factors: Moving neck too much to quickly  ?Relieving factors: Medication ? ?Are you having pain? Yes ?NPRS scale: 6/10 ?Pain location: Back ?Pain orientation: Lower, right ?PAIN TYPE: Chronic ?Pain description: Pressure, sharp ?Aggravating factors: Sitting extended periods, avoids bending, walking ?Relieving factors: Medication ? ? ?OBJECTIVE:  ?PATIENT SURVEYS:  ?Modified Oswestry 30/50 - 09/13/2021 ?  ?SENSATION: ?Light touch: Deficits patient reports generalized sensation deficit RLE  ?  ?MUSCLE LENGTH: ?Hamstring significant limitation bilaterally, when assessing right side patient began having spasm of right leg that resolved after a few seconds of patient relaxing ?  ?POSTURE:  ?Patient exhibits rounded shoulder and forward head posture, shoulders in elevated/tensed position, he demonstrates trunk lean to right and consistently shift weight due to pain ?  ?PALPATION: ?Generalized tenderness cervical and lumbar paraspinals, bilateral upper trap region  ?  ?LUMBAR AROM ?  ?AROM A/PROM  ?08/26/2021  ?Flexion <25%  ?Extension 0%  ?Right lateral flexion 25%  ?Left lateral flexion 25%  ?Right rotation 25%  ?Left rotation 25%  ?  ?Cervical AROM: ?  ?AROM A/PROM  ?08/26/2021  ?09/07/2021  ?09/13/2021  ?Flexion 15  20  ?Extension 20  30  ?Right lateral flexion 15  20  ?Left lateral flexion 20  30  ?Right rotation 20 30 35  ?Left rotation         25         30         40  ?  ?LE MMT: ?  ?MMT Right ?08/26/2021 Left ?08/26/2021 Right / Left ?09/13/2021  ?Hip flexion 4- 4- 4- / 4-  ?Hip extension 3- 3- 3 / 3  ?Hip abduction 3- 3- 3 / 3  ?Knee  flexion '4 4 4 '$ / 4  ?Knee extension '4 4 4 '$ / 4  ?Gross periscapular musculature 4- 4-  ? ?GAIT: ?Assistive device utilized: Single point cane ?Level of assistance: Modified independence ?Comments: Cane used on right, antalgic gait on right, decreased gait speed ?  ?  ?TODAY'S TREATMENT  ?[facility] Adult PT Treatment:                                                DATE: 10/11/21 ?Therapeutic Exercise: ?NuStep L5 x 5 min with UE/LE while taking subjective ?LTR 10 x 5 sec each ?Sidelying thoracic rotation 10 x 5 sec each ?Upper trap stretch 2 x 20 sec each ?Standing bent over step back stretch at counter 10 x 5 sec ?Seated cervical retraction 10 x 5 sec ?Manual: ?Skilled palpation and  monitoring of muscular tension while performing TPDN treatment ?STM for bilateral upper trap regions ?Suboccipital release with gentle manual traction x 3 bouts ?Trigger Point Dry Needling Treatment: ?Pre-treatment instruction: Patient instructed on dry needling rationale, procedures, and possible side effects including pain during treatment (achy,cramping feeling), bruising, drop of blood, lightheadedness, nausea, sweating. ?Patient Consent Given: Yes ?Education handout provided: No ?Muscles treated: Bilateral upper trap and splenius cervicis/capitus  ?Needle size and number: .30x75m x 6 ?Electrical stimulation performed: No ?Parameters: N/A ?Treatment response/outcome: Twitch response elicited, Palpable decrease in muscle tension, and patient reported improved tightness with cervical motion  ?Post-treatment instructions: Patient instructed to expect possible mild to moderate muscle soreness later today and/or tomorrow. Patient instructed in methods to reduce muscle soreness and to continue prescribed HEP. If patient was dry needled over the lung field, patient was instructed on signs and symptoms of pneumothorax and, however unlikely, to see immediate medical attention should they occur. Patient was also educated on signs and symptoms of  infection and to seek medical attention should they occur. Patient verbalized understanding of these instructions and education. ? ?[facility]                                                DATE: 3/17

## 2021-10-08 DIAGNOSIS — I480 Paroxysmal atrial fibrillation: Secondary | ICD-10-CM | POA: Diagnosis not present

## 2021-10-09 DIAGNOSIS — I480 Paroxysmal atrial fibrillation: Secondary | ICD-10-CM | POA: Diagnosis not present

## 2021-10-10 DIAGNOSIS — I480 Paroxysmal atrial fibrillation: Secondary | ICD-10-CM | POA: Diagnosis not present

## 2021-10-11 ENCOUNTER — Ambulatory Visit: Payer: Medicaid Other | Admitting: Physical Therapy

## 2021-10-11 ENCOUNTER — Telehealth: Payer: Self-pay | Admitting: Physical Therapy

## 2021-10-11 DIAGNOSIS — I480 Paroxysmal atrial fibrillation: Secondary | ICD-10-CM | POA: Diagnosis not present

## 2021-10-11 NOTE — Telephone Encounter (Signed)
Contacted patient due to missed PT appointment. Patient stated he just got back in town from [place] and forgot about his appointment. Patient was scheduled for an appointment on 10/12/2021 at 12:15pm, and he was advised of attendance policy. Patient expressed understanding. ? ?Hilda Blades, PT, DPT, LAT, ATC ?10/11/21  11:46 AM ?Phone: [phone] ?Fax: [phone] ? ?

## 2021-10-12 ENCOUNTER — Ambulatory Visit: Payer: Medicaid Other

## 2021-10-12 ENCOUNTER — Other Ambulatory Visit: Payer: Self-pay

## 2021-10-12 DIAGNOSIS — M6281 Muscle weakness (generalized): Secondary | ICD-10-CM | POA: Diagnosis not present

## 2021-10-12 DIAGNOSIS — G8929 Other chronic pain: Secondary | ICD-10-CM | POA: Diagnosis not present

## 2021-10-12 DIAGNOSIS — M542 Cervicalgia: Secondary | ICD-10-CM

## 2021-10-12 DIAGNOSIS — M545 Low back pain, unspecified: Secondary | ICD-10-CM | POA: Diagnosis not present

## 2021-10-12 DIAGNOSIS — I480 Paroxysmal atrial fibrillation: Secondary | ICD-10-CM | POA: Diagnosis not present

## 2021-10-12 NOTE — Therapy (Signed)
?OUTPATIENT PHYSICAL THERAPY TREATMENT NOTE ? ? ?Patient Name: Jason Stokes ?MRN: [id number] ?DOB:[birth year], 61 y.o., male ?Today's Date: 10/12/2021 ? ?PCP: Charlott Rakes, MD ?REFERRING PROVIDER: Charlott Rakes, MD ? ? PT End of Session - 10/12/21 1216   ? ? Visit Number 6   ? Number of Visits 8   ? Date for PT Re-Evaluation 10/21/21   ? Authorization Type Med Pay / [facility] [place]   ? Authorization Time Period 09/02/2021 - 09/22/2021   ? Authorization - Visit Number 3   ? Authorization - Number of Visits 3   ? PT Start Time 1220   ? PT Stop Time 1300   ? PT Time Calculation (min) 40 min   ? Activity Tolerance Patient limited by pain   ? Behavior During Therapy [facility] for tasks assessed/performed   ? ?  ?  ? ?  ? ? ? ? ? ?Past Medical History:  ?Diagnosis Date  ? Anxiety   ? Bilateral carpal tunnel syndrome 01/10/2018  ? Bipolar disorder ([place])   ? Chronic lower back pain   ? Depression   ? Dysrhythmia   ? a-fib  ? GERD (gastroesophageal reflux disease)   ? History of alcohol abuse   ? History of nuclear stress test   ? Myoview 10/16: EF 50%, diaphragmatic attenuation, no ischemia, low risk  ? Hypertension   ? Migraine 2012-2014  ? Moderate persistent asthma with acute exacerbation 05/02/2018  ? PAF (paroxysmal atrial fibrillation) ([place]) 03/27/2015  ? a. Myoview neg for ischemia >> Flecainide started 10/16 >> FU ETT   ? Schizophrenia ([place])   ? Sciatica neuralgia   ? Small vessel disease ([place])   ? Right basal ganglia stroke  ? Stroke [facility]) "between 2012-2014"  ? residual "AF" (09/22/2015)  ? ?Past Surgical History:  ?Procedure Laterality Date  ? ATRIAL FIBRILLATION ABLATION  09/22/2015  ? CYST EXCISION  1996-97  ? surgery back of head   ? ELECTROPHYSIOLOGIC STUDY N/A 09/22/2015  ? Procedure: Atrial Fibrillation Ablation;  Surgeon: Will [name], MD;  Location: [place] [facility];  Service: Cardiovascular;  Laterality: N/A;  ? ELECTROPHYSIOLOGIC STUDY N/A 12/10/2015  ? Procedure: Atrial Fibrillation Ablation;  Surgeon: Will [name], MD;  Location: [place] [facility];  Service: Cardiovascular;  Laterality: N/A;  ? ELECTROPHYSIOLOGIC STUDY N/A 12/11/2015  ? Procedure: Cardioversion;  Surgeon: Will [name], MD;  Location: [place] [facility];  Service: Cardiovascular;  Laterality: N/A;  ? EMBOLIZATION Right 08/19/2020  ? Procedure: EMBOLIZATION;  Surgeon: Cherre Robins, MD;  Location: [place] [facility];  Service: Cardiovascular;  Laterality: Right;  hypogastric  ? EXCISION MASS HEAD N/A 01/06/2017  ? Procedure: EXCISION MASS FOREHEAD;  Surgeon: Irene Limbo, MD;  Location: [place];  Service: Plastics;  Laterality: N/A;  ? GANGLION CYST EXCISION Left   ? INTERCOSTAL NERVE BLOCK  2005  ? KNEE ARTHROSCOPY Right 2016  ? MASS EXCISION N/A 01/25/2021  ? Procedure: EXCISION SUBCUTANEOUS VS SUBFASCIAL MASS TORSO 3CM;  Surgeon: Irene Limbo, MD;  Location: [place];  Service: Plastics;  Laterality: N/A;  ? ?Patient Active Problem List  ? Diagnosis Date Noted  ? Secondary hypercoagulable state ([place]) 03/23/2021  ? Type 2 diabetes mellitus with hyperglycemia, without long-term current use of insulin ([place]) 02/10/2021  ? Neck pain on left side 03/06/2019  ? Musculoskeletal chest pain 07/24/2018  ? Asthma, mild intermittent 05/02/2018  ? Allergic rhinitis caused by mold 05/02/2018  ? Tobacco use  05/02/2018  ? Bilateral carpal tunnel syndrome 01/10/2018  ? Stroke [facility])   ? Schizophrenia ([place])   ? Migraine   ? History of nuclear stress test   ? History of alcohol abuse   ? GERD (gastroesophageal reflux disease)   ? Dysrhythmia   ? Chronic lower back pain   ? Anxiety   ? Arthritis of knee, right 04/12/2017  ? Bipolar disorder ([place]) 04/03/2017  ? Lipoma of forehead 09/22/2016  ? Trigger ring finger of right hand 06/22/2016  ? Paroxysmal atrial fibrillation ([facility])   ? AF (atrial fibrillation) ([place]) 12/10/2015  ? Eczema 07/10/2015  ? History of CVA (cerebrovascular accident) 05/04/2015  ? Tear of medial  meniscus of right knee 03/18/2015  ? Chronic pain of right knee 03/12/2015  ? Other and unspecified hyperlipidemia 11/25/2013  ? Nasal congestion 11/25/2013  ? Sinusitis, chronic 05/09/2013  ? Chronic low back pain 10/12/2012  ? Lumbar radiculopathy 10/12/2012  ? Hypertension 10/12/2012  ? Depression 10/12/2012  ? Insomnia 10/12/2012  ? ? ?REFERRING DIAG: Chronic midline low back pain with right-sided sciatica ? ?THERAPY DIAG:  ?Chronic bilateral low back pain, unspecified whether sciatica present ? ?Cervicalgia ? ?PERTINENT HISTORY: Chronic pain, bipolar, HTN, history of CVA, depression/anxiety, Type II DM ? ?PRECAUTIONS: fall ? ?SUBJECTIVE: [place] from unexpected trip out of state, pain mainly in low back but neck also stiff/sore.  Feels DN was helpful and requests another session when scheduled with certified clinician. ? ?PAIN:  ?Are you having pain? Yes ?NPRS scale: 8/10 ?Pain location: Neck ?Pain orientation: Right ?PAIN TYPE: Chronic ?Pain description: Constant, tight, pressure, sharp ?Aggravating factors: Moving neck too much to quickly  ?Relieving factors: Medication ? ?Are you having pain? Yes ?NPRS scale: 6/10 ?Pain location: Back ?Pain orientation: Lower, right ?PAIN TYPE: Chronic ?Pain description: Pressure, sharp ?Aggravating factors: Sitting extended periods, avoids bending, walking ?Relieving factors: Medication ? ? ?OBJECTIVE:  ?PATIENT SURVEYS:  ?Modified Oswestry 30/50  ?  ?SENSATION: ?Light touch: Deficits patient reports generalized sensation deficit RLE  ?  ?MUSCLE LENGTH: ?Hamstring significant limitation bilaterally, when assessing right side patient began having spasm of right leg that resolved after a few seconds of patient relaxing ?  ?POSTURE:  ?Patient exhibits rounded shoulder and forward head posture, shoulders in elevated/tensed position, he demonstrates trunk lean to right and consistently shift weight due to pain ?  ?PALPATION: ?Generalized tenderness cervical and lumbar  paraspinals, bilateral upper trap region  ?  ?LUMBAR AROM ?  ?AROM A/PROM  ?08/26/2021 A/PROM  ?Flexion <25% 25%  ?Extension 0% 10%  ?Right lateral flexion 25% 25%  ?Left lateral flexion 25% 25%  ?Right rotation 25% 50%  ?Left rotation 25% 50%  ?  ?Cervical AROM: ?  ?AROM A/PROM  ?08/26/2021  ?09/07/2021  ?09/13/2021   ?Flexion 15  20 25%  ?Extension 20  30 10%  ?Right lateral flexion 15  20 10%  ?Left lateral flexion 20  30 25%  ?Right rotation 20 30 35 25%  ?Left rotation         25         30         40       75%  ?  ?LE MMT: ?  ?MMT Right ?08/26/2021 Left ?08/26/2021 Right / Left ?09/13/2021  ?Hip flexion 4- 4- 4- / 4-  ?Hip extension 3- 3- 3 / 3  ?Hip abduction 3- 3- 3 / 3  ?Knee flexion '4 4 4 '$ / 4  ?Knee extension 4 4  4 / 4  ?Gross periscapular musculature 4- 4-  ? ?GAIT: ?Assistive device utilized: Single point cane ?Level of assistance: Modified independence ?Comments: Cane used on right, antalgic gait on right, decreased gait speed ?  ?  ?TODAY'S TREATMENT  ?[facility] Adult PT Treatment:                                                DATE: 10/12/21 ?Therapeutic Exercise: ?NuStep L6 x 8 min with UE/LE ?Supine Horizontal abduction RTB 10x with VCs to breathe properly ?Supine alternating shoulder flexion, RTB 10/10 ?Cervical R rotation retraction towel stretch 10x ?Cervical extension/retraction with towel bracing 10x ?LTR 10 x 5 sec each ?Upper trap stretch 2 x 30 sec B ?Seated lumbar stretch to chair in front 5s 10x ?5x STS with OH reach ?Manual Therapy: ?  Attempted manual traction but unable to tolerate supine position ? ? ?[facility] Adult PT Treatment:                                                DATE: 10/01/21 ?Therapeutic Exercise: ?NuStep L4 x 8 min with UE/LE ?Cervical R rotation towel stretch 10x ?Cervical extension with towel bracing 10x ?LTR 10 x 5 sec each ?SL open book 10x B ?Upper trap stretch 2 x 20 sec each ?Seated lumbar stretch to chair in front 5s 10x ?Seated cervical retraction 10 x 5 sec ?Manual  Therapy: ?Manual UT stretch on R 30s x3 ?Suboccipital release with gentle manual traction 6 min ? ?09/13/2021: ? Therapeutic Exercise: ?NuStep L5 x 5 min with UE/LE while taking subjective ?LTR 10 x 5 sec each ?Sidelying thoracic ro

## 2021-10-13 ENCOUNTER — Ambulatory Visit (INDEPENDENT_AMBULATORY_CARE_PROVIDER_SITE_OTHER): Payer: Medicaid Other

## 2021-10-13 DIAGNOSIS — I480 Paroxysmal atrial fibrillation: Secondary | ICD-10-CM | POA: Diagnosis not present

## 2021-10-13 DIAGNOSIS — J309 Allergic rhinitis, unspecified: Secondary | ICD-10-CM | POA: Diagnosis not present

## 2021-10-13 NOTE — Therapy (Signed)
?OUTPATIENT PHYSICAL THERAPY TREATMENT NOTE ? ? ?Patient Name: Jason Stokes ?MRN: [id number] ?DOB:1960/12/23, 61 y.o., male ?Today's Date: 10/18/2021 ? ?PCP: Charlott Rakes, MD ?REFERRING PROVIDER: Charlott Rakes, MD ? ? PT End of Session - 10/18/21 1001   ? ? Visit Number 7   ? Number of Visits 8   ? Date for PT Re-Evaluation 10/21/21   ? Authorization Type Med Pay / M[employer] [place]   ? Authorization Time Period 09/23/2021 - 11/17/2021   ? Authorization - Visit Number 3   ? Authorization - Number of Visits 8   ? PT Start Time 1000   ? PT Stop Time 1045   ? PT Time Calculation (min) 45 min   ? Activity Tolerance Patient tolerated treatment well   ? Behavior During Therapy [facility] for tasks assessed/performed   ? ?  ?  ? ?  ? ? ? ? ? ? ?Past Medical History:  ?Diagnosis Date  ? Anxiety   ? Bilateral carpal tunnel syndrome 01/10/2018  ? Bipolar disorder ([place])   ? Chronic lower back pain   ? Depression   ? Dysrhythmia   ? a-fib  ? GERD (gastroesophageal reflux disease)   ? History of alcohol abuse   ? History of nuclear stress test   ? Myoview 10/16: EF 50%, diaphragmatic attenuation, no ischemia, low risk  ? Hypertension   ? Migraine 2012-2014  ? Moderate persistent asthma with acute exacerbation 05/02/2018  ? PAF (paroxysmal atrial fibrillation) ([place]) 03/27/2015  ? a. Myoview neg for ischemia >> Flecainide started 10/16 >> FU ETT   ? Schizophrenia ([place])   ? Sciatica neuralgia   ? Small vessel disease ([place])   ? Right basal ganglia stroke  ? Stroke [facility]) "between 2012-2014"  ? residual "AF" (09/22/2015)  ? ?Past Surgical History:  ?Procedure Laterality Date  ? ATRIAL FIBRILLATION ABLATION  09/22/2015  ? CYST EXCISION  1996-97  ? surgery back of head   ? ELECTROPHYSIOLOGIC STUDY N/A 09/22/2015  ? Procedure: Atrial Fibrillation Ablation;  Surgeon: Will [name], MD;  Location: [place] [facility];  Service: Cardiovascular;  Laterality: N/A;  ? ELECTROPHYSIOLOGIC STUDY N/A 12/10/2015  ? Procedure: Atrial Fibrillation Ablation;  Surgeon:  Will [name], MD;  Location: [place] [facility];  Service: Cardiovascular;  Laterality: N/A;  ? ELECTROPHYSIOLOGIC STUDY N/A 12/11/2015  ? Procedure: Cardioversion;  Surgeon: Will [name], MD;  Location: [place] [facility];  Service: Cardiovascular;  Laterality: N/A;  ? EMBOLIZATION Right 08/19/2020  ? Procedure: EMBOLIZATION;  Surgeon: Cherre Robins, MD;  Location: [place] [facility];  Service: Cardiovascular;  Laterality: Right;  hypogastric  ? EXCISION MASS HEAD N/A 01/06/2017  ? Procedure: EXCISION MASS FOREHEAD;  Surgeon: Irene Limbo, MD;  Location: [place];  Service: Plastics;  Laterality: N/A;  ? GANGLION CYST EXCISION Left   ? INTERCOSTAL NERVE BLOCK  2005  ? KNEE ARTHROSCOPY Right 2016  ? MASS EXCISION N/A 01/25/2021  ? Procedure: EXCISION SUBCUTANEOUS VS SUBFASCIAL MASS TORSO 3CM;  Surgeon: Irene Limbo, MD;  Location: [place];  Service: Plastics;  Laterality: N/A;  ? ?Patient Active Problem List  ? Diagnosis Date Noted  ? Secondary hypercoagulable state ([place]) 03/23/2021  ? Type 2 diabetes mellitus with hyperglycemia, without long-term current use of insulin ([place]) 02/10/2021  ? Neck pain on left side 03/06/2019  ? Musculoskeletal chest pain 07/24/2018  ? Asthma, mild intermittent 05/02/2018  ? Allergic rhinitis caused by mold 05/02/2018  ? Tobacco  use 05/02/2018  ? Bilateral carpal tunnel syndrome 01/10/2018  ? Stroke [facility])   ? Schizophrenia ([place])   ? Migraine   ? History of nuclear stress test   ? History of alcohol abuse   ? GERD (gastroesophageal reflux disease)   ? Dysrhythmia   ? Chronic lower back pain   ? Anxiety   ? Arthritis of knee, right 04/12/2017  ? Bipolar disorder ([place]) 04/03/2017  ? Lipoma of forehead 09/22/2016  ? Trigger ring finger of right hand 06/22/2016  ? Paroxysmal atrial fibrillation (HCC)   ? AF (atrial fibrillation) ([place]) 12/10/2015  ? Eczema 07/10/2015  ? History of CVA (cerebrovascular accident) 05/04/2015  ? Tear of  medial meniscus of right knee 03/18/2015  ? Chronic pain of right knee 03/12/2015  ? Other and unspecified hyperlipidemia 11/25/2013  ? Nasal congestion 11/25/2013  ? Sinusitis, chronic 05/09/2013  ? Chronic low back pain 10/12/2012  ? Lumbar radiculopathy 10/12/2012  ? Hypertension 10/12/2012  ? Depression 10/12/2012  ? Insomnia 10/12/2012  ? ? ?REFERRING DIAG: Chronic midline low back pain with right-sided sciatica ? ?THERAPY DIAG:  ?Chronic bilateral low back pain, unspecified whether sciatica present ? ?Cervicalgia ? ?Muscle weakness (generalized) ? ?Other abnormalities of gait and mobility ? ?PERTINENT HISTORY: Chronic pain, bipolar, HTN, history of CVA, depression/anxiety, Type II DM ? ?PRECAUTIONS: fall ? ?SUBJECTIVE: Patient reports his sciatic nerve continues to be aggravated, overall improved from last visit. He would like to do dry needling for low back. ? ?PAIN:  ?Are you having pain? Yes ?NPRS scale: 6/10 ?Pain location: Neck ?Pain orientation: Right ?PAIN TYPE: Chronic ?Pain description: Constant, tight, pressure, sharp ?Aggravating factors: Moving neck too much to quickly  ?Relieving factors: Medication ? ?Are you having pain? Yes ?NPRS scale: 6/10 ?Pain location: Back ?Pain orientation: Lower, right ?PAIN TYPE: Chronic ?Pain description: Pressure, sharp ?Aggravating factors: Sitting extended periods, avoids bending, walking ?Relieving factors: Medication ? ? ?OBJECTIVE:  ?PATIENT SURVEYS:  ?Modified Oswestry 30/50  ?  ?SENSATION: ?Light touch: Deficits patient reports generalized sensation deficit RLE  ?  ?MUSCLE LENGTH: ?Hamstring significant limitation bilaterally, when assessing right side patient began having spasm of right leg that resolved after a few seconds of patient relaxing ?  ?POSTURE:  ?Patient exhibits rounded shoulder and forward head posture, shoulders in elevated/tensed position, he demonstrates trunk lean to right and consistently shift weight due to pain ?  ?PALPATION: ?Generalized  tenderness cervical and lumbar paraspinals, bilateral upper trap region  ?  ?LUMBAR AROM ?  ?AROM A/PROM  ?08/26/2021 A/PROM ?10/12/2021 AROM ?10/18/2021  ?Flexion <25% 25% 50%  ?Extension 0% 10%   ?Right lateral flexion 25% 25%   ?Left lateral flexion 25% 25%   ?Right rotation 25% 50% 50%  ?Left rotation 25% 50% 50%  ?  ?Cervical AROM: ?  ?AROM A/PROM  ?08/26/2021  ?09/07/2021  ?09/13/2021  ?10/12/2021  ?Flexion 15  20 25%  ?Extension 20  30 10%  ?Right lateral flexion 15  20 10%  ?Left lateral flexion 20  30 25%  ?Right rotation 20 30 35 25%  ?Left rotation         25         30         40       75%  ?  ?LE MMT: ?  ?MMT Right ?08/26/2021 Left ?08/26/2021 Right / Left ?09/13/2021  ?Hip flexion 4- 4- 4- / 4-  ?Hip extension 3- 3- 3 / 3  ?Hip abduction 3- 3-  3 / 3  ?Knee flexion '4 4 4 '$ / 4  ?Knee extension '4 4 4 '$ / 4  ?Gross periscapular musculature 4- 4-  ? ?GAIT: ?Assistive device utilized: Single point cane ?Level of assistance: Modified independence ?Comments: Cane used on right, antalgic gait on right, decreased gait speed ?  ?  ?TODAY'S TREATMENT  ?[facility] Adult PT Treatment:                                                DATE: 10/18/2021 ? Therapeutic Exercise: ?NuStep L5 x 5 min with UE/LE while taking subjective ?LTR 10 x 5 sec each ?Piriformis stretch 2 x 30 sec each ?Sidelying thoracolumbar rotation 10 x 5 sec each ?Seated lumbar flexion stretch physioball roll out 10 x 5 sec ?Manual: ?Skilled palpation and monitoring of muscular tension while performing TPDN treatment ?STM for lumbar paraspinals ?Trigger Point Dry Needling Treatment: ?Pre-treatment instruction: Patient instructed on dry needling rationale, procedures, and possible side effects including pain during treatment (achy,cramping feeling), bruising, drop of blood, lightheadedness, nausea, sweating. ?Patient Consent Given: Yes ?Education handout provided: No ?Muscles treated: Bilateral lumbar multifidus  ?Needle size and number: .30x12m x 4 ?Electrical stimulation  performed: Yes ?Parameters: Micro 80 to patient tolerance x 5 min, Milli 20 to patient tolerance x 5 min ?Treatment response/outcome: Twitch response elicited, Palpable decrease in muscle tension, and pati

## 2021-10-14 DIAGNOSIS — M25561 Pain in right knee: Secondary | ICD-10-CM | POA: Diagnosis not present

## 2021-10-14 DIAGNOSIS — M545 Low back pain, unspecified: Secondary | ICD-10-CM | POA: Diagnosis not present

## 2021-10-14 DIAGNOSIS — M25562 Pain in left knee: Secondary | ICD-10-CM | POA: Diagnosis not present

## 2021-10-14 DIAGNOSIS — Z79899 Other long term (current) drug therapy: Secondary | ICD-10-CM | POA: Diagnosis not present

## 2021-10-14 DIAGNOSIS — I480 Paroxysmal atrial fibrillation: Secondary | ICD-10-CM | POA: Diagnosis not present

## 2021-10-14 DIAGNOSIS — G8929 Other chronic pain: Secondary | ICD-10-CM | POA: Diagnosis not present

## 2021-10-15 DIAGNOSIS — Z79899 Other long term (current) drug therapy: Secondary | ICD-10-CM | POA: Diagnosis not present

## 2021-10-15 DIAGNOSIS — I480 Paroxysmal atrial fibrillation: Secondary | ICD-10-CM | POA: Diagnosis not present

## 2021-10-17 DIAGNOSIS — I480 Paroxysmal atrial fibrillation: Secondary | ICD-10-CM | POA: Diagnosis not present

## 2021-10-18 ENCOUNTER — Ambulatory Visit: Payer: Medicaid Other | Attending: Family Medicine | Admitting: Physical Therapy

## 2021-10-18 ENCOUNTER — Encounter: Payer: Self-pay | Admitting: Physical Therapy

## 2021-10-18 ENCOUNTER — Other Ambulatory Visit: Payer: Self-pay

## 2021-10-18 DIAGNOSIS — I480 Paroxysmal atrial fibrillation: Secondary | ICD-10-CM | POA: Diagnosis not present

## 2021-10-18 DIAGNOSIS — R2689 Other abnormalities of gait and mobility: Secondary | ICD-10-CM | POA: Insufficient documentation

## 2021-10-18 DIAGNOSIS — M6281 Muscle weakness (generalized): Secondary | ICD-10-CM | POA: Insufficient documentation

## 2021-10-18 DIAGNOSIS — G8929 Other chronic pain: Secondary | ICD-10-CM | POA: Diagnosis not present

## 2021-10-18 DIAGNOSIS — M542 Cervicalgia: Secondary | ICD-10-CM | POA: Insufficient documentation

## 2021-10-18 DIAGNOSIS — M545 Low back pain, unspecified: Secondary | ICD-10-CM | POA: Diagnosis not present

## 2021-10-20 DIAGNOSIS — I480 Paroxysmal atrial fibrillation: Secondary | ICD-10-CM | POA: Diagnosis not present

## 2021-10-21 ENCOUNTER — Ambulatory Visit (INDEPENDENT_AMBULATORY_CARE_PROVIDER_SITE_OTHER): Payer: Medicaid Other

## 2021-10-21 DIAGNOSIS — J309 Allergic rhinitis, unspecified: Secondary | ICD-10-CM

## 2021-10-21 DIAGNOSIS — I480 Paroxysmal atrial fibrillation: Secondary | ICD-10-CM | POA: Diagnosis not present

## 2021-10-21 NOTE — Therapy (Incomplete)
?OUTPATIENT PHYSICAL THERAPY TREATMENT NOTE ? ? ?Patient Name: Jason Stokes ?MRN: [id number] ?DOB:Jan 17, 1961, 61 y.o., male ?Today's Date: 10/21/2021 ? ?PCP: Charlott Rakes, MD ?REFERRING PROVIDER: Charlott Rakes, MD ? ? ? ? ? ? ? ? ?Past Medical History:  ?Diagnosis Date  ? Anxiety   ? Bilateral carpal tunnel syndrome 01/10/2018  ? Bipolar disorder ([place])   ? Chronic lower back pain   ? Depression   ? Dysrhythmia   ? a-fib  ? GERD (gastroesophageal reflux disease)   ? History of alcohol abuse   ? History of nuclear stress test   ? Myoview 10/16: EF 50%, diaphragmatic attenuation, no ischemia, low risk  ? Hypertension   ? Migraine 2012-2014  ? Moderate persistent asthma with acute exacerbation 05/02/2018  ? PAF (paroxysmal atrial fibrillation) ([place]) 03/27/2015  ? a. Myoview neg for ischemia >> Flecainide started 10/16 >> FU ETT   ? Schizophrenia ([place])   ? Sciatica neuralgia   ? Small vessel disease ([place])   ? Right basal ganglia stroke  ? Stroke [facility]) "between 2012-2014"  ? residual "AF" (09/22/2015)  ? ?Past Surgical History:  ?Procedure Laterality Date  ? ATRIAL FIBRILLATION ABLATION  09/22/2015  ? CYST EXCISION  1996-97  ? surgery back of head   ? ELECTROPHYSIOLOGIC STUDY N/A 09/22/2015  ? Procedure: Atrial Fibrillation Ablation;  Surgeon: Will [name], MD;  Location: [place] [facility];  Service: Cardiovascular;  Laterality: N/A;  ? ELECTROPHYSIOLOGIC STUDY N/A 12/10/2015  ? Procedure: Atrial Fibrillation Ablation;  Surgeon: Will [name], MD;  Location: [place] [facility];  Service: Cardiovascular;  Laterality: N/A;  ? ELECTROPHYSIOLOGIC STUDY N/A 12/11/2015  ? Procedure: Cardioversion;  Surgeon: Will [name], MD;  Location: [place] [facility];  Service: Cardiovascular;  Laterality: N/A;  ? EMBOLIZATION Right 08/19/2020  ? Procedure: EMBOLIZATION;  Surgeon: Cherre Robins, MD;  Location: [place] [facility];  Service: Cardiovascular;  Laterality: Right;  hypogastric  ? EXCISION MASS HEAD N/A 01/06/2017   ? Procedure: EXCISION MASS FOREHEAD;  Surgeon: Irene Limbo, MD;  Location: [place];  Service: Plastics;  Laterality: N/A;  ? GANGLION CYST EXCISION Left   ? INTERCOSTAL NERVE BLOCK  2005  ? KNEE ARTHROSCOPY Right 2016  ? MASS EXCISION N/A 01/25/2021  ? Procedure: EXCISION SUBCUTANEOUS VS SUBFASCIAL MASS TORSO 3CM;  Surgeon: Irene Limbo, MD;  Location: [place];  Service: Plastics;  Laterality: N/A;  ? ?Patient Active Problem List  ? Diagnosis Date Noted  ? Secondary hypercoagulable state ([place]) 03/23/2021  ? Type 2 diabetes mellitus with hyperglycemia, without long-term current use of insulin ([place]) 02/10/2021  ? Neck pain on left side 03/06/2019  ? Musculoskeletal chest pain 07/24/2018  ? Asthma, mild intermittent 05/02/2018  ? Allergic rhinitis caused by mold 05/02/2018  ? Tobacco use 05/02/2018  ? Bilateral carpal tunnel syndrome 01/10/2018  ? Stroke [facility])   ? Schizophrenia ([place])   ? Migraine   ? History of nuclear stress test   ? History of alcohol abuse   ? GERD (gastroesophageal reflux disease)   ? Dysrhythmia   ? Chronic lower back pain   ? Anxiety   ? Arthritis of knee, right 04/12/2017  ? Bipolar disorder ([place]) 04/03/2017  ? Lipoma of forehead 09/22/2016  ? Trigger ring finger of right hand 06/22/2016  ? Paroxysmal atrial fibrillation (HCC)   ? AF (atrial fibrillation) ([place]) 12/10/2015  ? Eczema 07/10/2015  ? History of CVA (cerebrovascular accident) 05/04/2015  ? Tear  of medial meniscus of right knee 03/18/2015  ? Chronic pain of right knee 03/12/2015  ? Other and unspecified hyperlipidemia 11/25/2013  ? Nasal congestion 11/25/2013  ? Sinusitis, chronic 05/09/2013  ? Chronic low back pain 10/12/2012  ? Lumbar radiculopathy 10/12/2012  ? Hypertension 10/12/2012  ? Depression 10/12/2012  ? Insomnia 10/12/2012  ? ? ?REFERRING DIAG: Chronic midline low back pain with right-sided sciatica ? ?THERAPY DIAG:  ?No diagnosis found. ? ?PERTINENT HISTORY: Chronic pain,  bipolar, HTN, history of CVA, depression/anxiety, Type II DM ? ?PRECAUTIONS: fall ? ?SUBJECTIVE: Patient reports his sciatic nerve continues to be aggravated, overall improved from last visit. He would like to do dry needling for low back. ? ?PAIN:  ?Are you having pain? Yes ?NPRS scale: 6/10 ?Pain location: Neck ?Pain orientation: Right ?PAIN TYPE: Chronic ?Pain description: Constant, tight, pressure, sharp ?Aggravating factors: Moving neck too much to quickly  ?Relieving factors: Medication ? ?Are you having pain? Yes ?NPRS scale: 6/10 ?Pain location: Back ?Pain orientation: Lower, right ?PAIN TYPE: Chronic ?Pain description: Pressure, sharp ?Aggravating factors: Sitting extended periods, avoids bending, walking ?Relieving factors: Medication ? ? ?OBJECTIVE:  ?PATIENT SURVEYS:  ?Modified Oswestry 30/50  ?  ?SENSATION: ?Light touch: Deficits patient reports generalized sensation deficit RLE  ?  ?MUSCLE LENGTH: ?Hamstring significant limitation bilaterally, when assessing right side patient began having spasm of right leg that resolved after a few seconds of patient relaxing ?  ?POSTURE:  ?Patient exhibits rounded shoulder and forward head posture, shoulders in elevated/tensed position, he demonstrates trunk lean to right and consistently shift weight due to pain ?  ?PALPATION: ?Generalized tenderness cervical and lumbar paraspinals, bilateral upper trap region  ?  ?LUMBAR AROM ?  ?AROM A/PROM  ?08/26/2021 A/PROM ?10/12/2021 AROM ?10/18/2021  ?Flexion <25% 25% 50%  ?Extension 0% 10%   ?Right lateral flexion 25% 25%   ?Left lateral flexion 25% 25%   ?Right rotation 25% 50% 50%  ?Left rotation 25% 50% 50%  ?  ?Cervical AROM: ?  ?AROM A/PROM  ?08/26/2021  ?09/07/2021  ?09/13/2021  ?10/12/2021  ?Flexion 15  20 25%  ?Extension 20  30 10%  ?Right lateral flexion 15  20 10%  ?Left lateral flexion 20  30 25%  ?Right rotation 20 30 35 25%  ?Left rotation         25         30         40       75%  ?  ?LE MMT: ?  ?MMT Right ?08/26/2021  Left ?08/26/2021 Right / Left ?09/13/2021  ?Hip flexion 4- 4- 4- / 4-  ?Hip extension 3- 3- 3 / 3  ?Hip abduction 3- 3- 3 / 3  ?Knee flexion '4 4 4 '$ / 4  ?Knee extension '4 4 4 '$ / 4  ?Gross periscapular musculature 4- 4-  ? ?GAIT: ?Assistive device utilized: Single point cane ?Level of assistance: Modified independence ?Comments: Cane used on right, antalgic gait on right, decreased gait speed ?  ?  ?TODAY'S TREATMENT  ?[facility] Adult PT Treatment:                                                DATE: 10/18/2021 ? Therapeutic Exercise: ?NuStep L5 x 5 min with UE/LE while taking subjective ?LTR 10 x 5 sec each ?Piriformis stretch 2 x 30 sec  each ?Sidelying thoracolumbar rotation 10 x 5 sec each ?Seated lumbar flexion stretch physioball roll out 10 x 5 sec ?Manual: ?Skilled palpation and monitoring of muscular tension while performing TPDN treatment ?STM for lumbar paraspinals ?Trigger Point Dry Needling Treatment: ?Pre-treatment instruction: Patient instructed on dry needling rationale, procedures, and possible side effects including pain during treatment (achy,cramping feeling), bruising, drop of blood, lightheadedness, nausea, sweating. ?Patient Consent Given: Yes ?Education handout provided: No ?Muscles treated: Bilateral lumbar multifidus  ?Needle size and number: .30x70m x 4 ?Electrical stimulation performed: Yes ?Parameters: Micro 80 to patient tolerance x 5 min, Milli 20 to patient tolerance x 5 min ?Treatment response/outcome: Twitch response elicited, Palpable decrease in muscle tension, and patient reported reduced pain and tightness with movement ?Post-treatment instructions: Patient instructed to expect possible mild to moderate muscle soreness later today and/or tomorrow. Patient instructed in methods to reduce muscle soreness and to continue prescribed HEP. If patient was dry needled over the lung field, patient was instructed on signs and symptoms of pneumothorax and, however unlikely, to see immediate medical  attention should they occur. Patient was also educated on signs and symptoms of infection and to seek medical attention should they occur. Patient verbalized understanding of these instructions and education.

## 2021-10-22 DIAGNOSIS — I480 Paroxysmal atrial fibrillation: Secondary | ICD-10-CM | POA: Diagnosis not present

## 2021-10-23 DIAGNOSIS — I480 Paroxysmal atrial fibrillation: Secondary | ICD-10-CM | POA: Diagnosis not present

## 2021-10-24 DIAGNOSIS — I480 Paroxysmal atrial fibrillation: Secondary | ICD-10-CM | POA: Diagnosis not present

## 2021-10-25 ENCOUNTER — Ambulatory Visit: Payer: Medicaid Other | Admitting: Physical Therapy

## 2021-10-25 DIAGNOSIS — I480 Paroxysmal atrial fibrillation: Secondary | ICD-10-CM | POA: Diagnosis not present

## 2021-10-27 DIAGNOSIS — I480 Paroxysmal atrial fibrillation: Secondary | ICD-10-CM | POA: Diagnosis not present

## 2021-10-28 DIAGNOSIS — I480 Paroxysmal atrial fibrillation: Secondary | ICD-10-CM | POA: Diagnosis not present

## 2021-10-29 ENCOUNTER — Ambulatory Visit (INDEPENDENT_AMBULATORY_CARE_PROVIDER_SITE_OTHER): Payer: Medicaid Other

## 2021-10-29 DIAGNOSIS — J309 Allergic rhinitis, unspecified: Secondary | ICD-10-CM | POA: Diagnosis not present

## 2021-10-29 DIAGNOSIS — I480 Paroxysmal atrial fibrillation: Secondary | ICD-10-CM | POA: Diagnosis not present

## 2021-10-31 DIAGNOSIS — I480 Paroxysmal atrial fibrillation: Secondary | ICD-10-CM | POA: Diagnosis not present

## 2021-11-01 DIAGNOSIS — I480 Paroxysmal atrial fibrillation: Secondary | ICD-10-CM | POA: Diagnosis not present

## 2021-11-01 NOTE — Therapy (Signed)
?OUTPATIENT PHYSICAL THERAPY TREATMENT NOTE ? ? ?Patient Name: Jason Stokes ?MRN: [id number] ?DOB:[birth year], 61 y.o., male ?Today's Date: 11/03/2021 ? ?PCP: Charlott Rakes, MD ?REFERRING PROVIDER: Charlott Rakes, MD ? ? PT End of Session - 11/03/21 1040   ? ? Visit Number 8   ? Number of Visits 12   ? Date for PT Re-Evaluation 12/01/21   ? Authorization Type Med Pay / MCD [place]   ? Authorization Time Period 09/23/2021 - 11/17/2021   ? Authorization - Visit Number 4   ? Authorization - Number of Visits 8   ? PT Start Time 1001   ? PT Stop Time 1045   ? PT Time Calculation (min) 44 min   ? Activity Tolerance Patient tolerated treatment well   ? Behavior During Therapy [facility] for tasks assessed/performed   ? ?  ?  ? ?  ? ? ? ? ? ? ? ?Past Medical History:  ?Diagnosis Date  ? Anxiety   ? Bilateral carpal tunnel syndrome 01/10/2018  ? Bipolar disorder ([place])   ? Chronic lower back pain   ? Depression   ? Dysrhythmia   ? a-fib  ? GERD (gastroesophageal reflux disease)   ? History of alcohol abuse   ? History of nuclear stress test   ? Myoview 10/16: EF 50%, diaphragmatic attenuation, no ischemia, low risk  ? Hypertension   ? Migraine 2012-2014  ? Moderate persistent asthma with acute exacerbation 05/02/2018  ? PAF (paroxysmal atrial fibrillation) ([place]) 03/27/2015  ? a. Myoview neg for ischemia >> Flecainide started 10/16 >> FU ETT   ? Schizophrenia ([place])   ? Sciatica neuralgia   ? Small vessel disease ([place])   ? Right basal ganglia stroke  ? Stroke [facility]) "between 2012-2014"  ? residual "AF" (09/22/2015)  ? ?Past Surgical History:  ?Procedure Laterality Date  ? ATRIAL FIBRILLATION ABLATION  09/22/2015  ? CYST EXCISION  1996-97  ? surgery back of head   ? ELECTROPHYSIOLOGIC STUDY N/A 09/22/2015  ? Procedure: Atrial Fibrillation Ablation;  Surgeon: Will [name], MD;  Location: [place] CV [facility];  Service: Cardiovascular;  Laterality: N/A;  ? ELECTROPHYSIOLOGIC STUDY N/A 12/10/2015  ? Procedure: Atrial Fibrillation Ablation;   Surgeon: Will [name], MD;  Location: [place] CV LAB;  Service: Cardiovascular;  Laterality: N/A;  ? ELECTROPHYSIOLOGIC STUDY N/A 12/11/2015  ? Procedure: Cardioversion;  Surgeon: Will [name], MD;  Location: [place] CV LAB;  Service: Cardiovascular;  Laterality: N/A;  ? EMBOLIZATION Right 08/19/2020  ? Procedure: EMBOLIZATION;  Surgeon: Cherre Robins, MD;  Location: [place] CV LAB;  Service: Cardiovascular;  Laterality: Right;  hypogastric  ? EXCISION MASS HEAD N/A 01/06/2017  ? Procedure: EXCISION MASS FOREHEAD;  Surgeon: Irene Limbo, MD;  Location: [place];  Service: Plastics;  Laterality: N/A;  ? GANGLION CYST EXCISION Left   ? INTERCOSTAL NERVE BLOCK  2005  ? KNEE ARTHROSCOPY Right 2016  ? MASS EXCISION N/A 01/25/2021  ? Procedure: EXCISION SUBCUTANEOUS VS SUBFASCIAL MASS TORSO 3CM;  Surgeon: Irene Limbo, MD;  Location: [place];  Service: Plastics;  Laterality: N/A;  ? ?Patient Active Problem List  ? Diagnosis Date Noted  ? Secondary hypercoagulable state ([place]) 03/23/2021  ? Type 2 diabetes mellitus with hyperglycemia, without long-term current use of insulin ([place]) 02/10/2021  ? Neck pain on left side 03/06/2019  ? Musculoskeletal chest pain 07/24/2018  ? Asthma, mild intermittent 05/02/2018  ? Allergic rhinitis caused by mold 05/02/2018  ?  Tobacco use 05/02/2018  ? Bilateral carpal tunnel syndrome 01/10/2018  ? Stroke [facility])   ? Schizophrenia ([place])   ? Migraine   ? History of nuclear stress test   ? History of alcohol abuse   ? GERD (gastroesophageal reflux disease)   ? Dysrhythmia   ? Chronic lower back pain   ? Anxiety   ? Arthritis of knee, right 04/12/2017  ? Bipolar disorder ([place]) 04/03/2017  ? Lipoma of forehead 09/22/2016  ? Trigger ring finger of right hand 06/22/2016  ? Paroxysmal atrial fibrillation (H[facility])   ? AF (atrial fibrillation) ([place]) 12/10/2015  ? Eczema 07/10/2015  ? History of CVA (cerebrovascular accident) 05/04/2015  ?  Tear of medial meniscus of right knee 03/18/2015  ? Chronic pain of right knee 03/12/2015  ? Other and unspecified hyperlipidemia 11/25/2013  ? Nasal congestion 11/25/2013  ? Sinusitis, chronic 05/09/2013  ? Chronic low back pain 10/12/2012  ? Lumbar radiculopathy 10/12/2012  ? Hypertension 10/12/2012  ? Depression 10/12/2012  ? Insomnia 10/12/2012  ? ? ?REFERRING DIAG: Chronic midline low back pain with right-sided sciatica ? ?THERAPY DIAG:  ?Chronic bilateral low back pain, unspecified whether sciatica present ? ?Cervicalgia ? ?Muscle weakness (generalized) ? ?Other abnormalities of gait and mobility ? ?PERTINENT HISTORY: Chronic pain, bipolar, HTN, history of CVA, depression/anxiety, Type II DM ? ?PRECAUTIONS: fall ? ?SUBJECTIVE: Patient reports the dry needling with electricity gave him great relief in his lower back so would like to try that in his neck today. States his back is feeling a whole lot better. Patient states he is able to walk a mile at this point, he would like to get up to 2-3 miles. ? ?PAIN:  ?Are you having pain? Yes ?NPRS scale: 6/10 ?Pain location: Neck ?Pain orientation: Right ?PAIN TYPE: Chronic ?Pain description: Constant, tight, pressure, sharp ?Aggravating factors: Moving neck too much to quickly  ?Relieving factors: Medication ? ?Are you having pain? Yes ?NPRS scale: 3/10 ?Pain location: Back ?Pain orientation: Lower, right ?PAIN TYPE: Chronic ?Pain description: Pressure, sharp ?Aggravating factors: Sitting extended periods, avoids bending, walking ?Relieving factors: Medication ? ? ?OBJECTIVE:  ?PATIENT SURVEYS:  ?Modified Oswestry 25/50 - 11/03/2021 ?  ?SENSATION: ?Light touch: Deficits patient reports generalized sensation deficit RLE  ?  ?MUSCLE LENGTH: ?Hamstring significant limitation bilaterally, when assessing right side patient began having spasm of right leg that resolved after a few seconds of patient relaxing ?  ?POSTURE:  ?Patient exhibits rounded shoulder and forward head  posture - 11/03/2021 ?  ?PALPATION: ?Generalized tenderness cervical and lumbar paraspinals, bilateral upper trap region  ?  ?LUMBAR AROM ?  ?AROM A/PROM  ?08/26/2021 A/PROM ?10/12/2021 AROM ?10/18/2021  ?11/03/2021  ?Flexion <25% 25% 50% 75%  ?Extension 0% 10%  25%  ?Right lateral flexion 25% 25%    ?Left lateral flexion 25% 25%    ?Right rotation 25% 50% 50% 75%  ?Left rotation 25% 50% 50% 75%  ?  ?Cervical AROM: ?  ?AROM A/PROM  ?08/26/2021  ?09/07/2021  ?09/13/2021  ?10/12/2021  ?11/03/2021  ?Flexion 15  20 25%   ?Extension 20  30 10%   ?Right lateral flexion 15  20 10%   ?Left lateral flexion 20  30 25%   ?Right rotation 20 30 35 25% 50  ?Left rotation         25         30         40       75% 65  ?  ?  LE MMT: ?  ?MMT Right ?08/26/2021 Left ?08/26/2021 Right / Left ?09/13/2021 Rt / Lt ?11/03/2021  ?Hip flexion 4- 4- 4- / 4- 4 / 4  ?Hip extension 3- 3- 3 / 3 3 / 3  ?Hip abduction 3- 3- 3 / 3 4- / 4-  ?Knee flexion '4 4 4 '$ / 4 4 / 4  ?Knee extension '4 4 4 '$ / 4 4 / 4  ?Gross periscapular musculature 4- 4- 4-  ? ?GAIT: ?Assistive device utilized: Single point cane ?Level of assistance: Modified independence ?Comments: Cane used on right, antalgic gait on right, decreased gait speed ?  ?  ?TODAY'S TREATMENT  ?[facility] Adult PT Treatment:                                                DATE: 11/03/2021 ? Therapeutic Exercise: ?NuStep L5 x 5 min with UE/LE while taking subjective ?LTR 10 x 5 sec each ?Sidelying thoracolumbar rotation 10 x 5 sec each ?Seated lumbar flexion stretch physioball roll out 10 x 5 sec ?Seated upper trap stretch 2 x 20 sec ?Standing thoracic extension step back stretch 5 x 5 sec ?Manual: ?Skilled palpation and monitoring of muscular tension while performing TPDN treatment ?STM for cervical paraspinals and upper traps ?Suboccipital release with gentle manual traction x 3 bouts ?Trigger Point Dry Needling Treatment: ?Pre-treatment instruction: Patient instructed on dry needling rationale, procedures, and possible side  effects including pain during treatment (achy,cramping feeling), bruising, drop of blood, lightheadedness, nausea, sweating. ?Patient Consent Given: Yes ?Education handout provided: No ?Muscles treated: Bilateral

## 2021-11-02 DIAGNOSIS — I480 Paroxysmal atrial fibrillation: Secondary | ICD-10-CM | POA: Diagnosis not present

## 2021-11-03 ENCOUNTER — Other Ambulatory Visit: Payer: Self-pay

## 2021-11-03 ENCOUNTER — Ambulatory Visit (INDEPENDENT_AMBULATORY_CARE_PROVIDER_SITE_OTHER): Payer: Medicaid Other

## 2021-11-03 ENCOUNTER — Ambulatory Visit: Payer: Medicaid Other | Admitting: Physical Therapy

## 2021-11-03 ENCOUNTER — Encounter: Payer: Self-pay | Admitting: Physical Therapy

## 2021-11-03 DIAGNOSIS — R2689 Other abnormalities of gait and mobility: Secondary | ICD-10-CM | POA: Diagnosis not present

## 2021-11-03 DIAGNOSIS — M545 Low back pain, unspecified: Secondary | ICD-10-CM

## 2021-11-03 DIAGNOSIS — M542 Cervicalgia: Secondary | ICD-10-CM

## 2021-11-03 DIAGNOSIS — M6281 Muscle weakness (generalized): Secondary | ICD-10-CM

## 2021-11-03 DIAGNOSIS — J309 Allergic rhinitis, unspecified: Secondary | ICD-10-CM | POA: Diagnosis not present

## 2021-11-03 DIAGNOSIS — I480 Paroxysmal atrial fibrillation: Secondary | ICD-10-CM | POA: Diagnosis not present

## 2021-11-03 DIAGNOSIS — G8929 Other chronic pain: Secondary | ICD-10-CM | POA: Diagnosis not present

## 2021-11-03 NOTE — Patient Instructions (Signed)
Access Code: [facility] ?URL: [email or web address] ?Date: 11/03/2021 ?Prepared by: Hilda Blades ? ?Exercises ?- Seated Scapular Retraction  - 1-2 x daily - 10 reps - 3 seconds hold ?- Gentle Upper Trap Stretch  - 1-2 x daily - 3 reps - 10 seconds hold ?- Supine Lower Trunk Rotation  - 1-2 x daily - 10 reps - 10 seconds hold ?- Supine Piriformis Stretch with Foot on Ground  - 1-2 x daily - 2 reps - 20 seconds hold ?- Small Range Straight Leg Raise  - 1-2 x daily - 2 sets - 5 reps ?- Supine Bridge  - 1-2 x daily - 2 sets - 5 reps ?- Sidelying Thoracic Lumbar Rotation  - 1-2 x daily - 10 reps - 10 secons hold ?- Seated Hamstring Stretch  - 1-2 x daily - 2 reps - 20 seconds hold ?- Sit to Stand Without Arm Support  - 1-2 x daily - 2 sets - 10 reps ?- Standing 'L' Stretch at Counter  - 1-2 x daily - 10 reps - 10 seconds hold ?- Banded Row  - 1-2 x daily - 2 sets - 10 reps ?

## 2021-11-04 DIAGNOSIS — I480 Paroxysmal atrial fibrillation: Secondary | ICD-10-CM | POA: Diagnosis not present

## 2021-11-05 DIAGNOSIS — I480 Paroxysmal atrial fibrillation: Secondary | ICD-10-CM | POA: Diagnosis not present

## 2021-11-06 DIAGNOSIS — I480 Paroxysmal atrial fibrillation: Secondary | ICD-10-CM | POA: Diagnosis not present

## 2021-11-07 DIAGNOSIS — I480 Paroxysmal atrial fibrillation: Secondary | ICD-10-CM | POA: Diagnosis not present

## 2021-11-08 ENCOUNTER — Encounter: Payer: Self-pay | Admitting: Physical Therapy

## 2021-11-08 ENCOUNTER — Other Ambulatory Visit: Payer: Self-pay

## 2021-11-08 ENCOUNTER — Ambulatory Visit: Payer: Medicaid Other | Admitting: Physical Therapy

## 2021-11-08 DIAGNOSIS — M542 Cervicalgia: Secondary | ICD-10-CM | POA: Diagnosis not present

## 2021-11-08 DIAGNOSIS — R2689 Other abnormalities of gait and mobility: Secondary | ICD-10-CM

## 2021-11-08 DIAGNOSIS — M6281 Muscle weakness (generalized): Secondary | ICD-10-CM

## 2021-11-08 DIAGNOSIS — G8929 Other chronic pain: Secondary | ICD-10-CM | POA: Diagnosis not present

## 2021-11-08 DIAGNOSIS — I480 Paroxysmal atrial fibrillation: Secondary | ICD-10-CM | POA: Diagnosis not present

## 2021-11-08 DIAGNOSIS — M545 Low back pain, unspecified: Secondary | ICD-10-CM | POA: Diagnosis not present

## 2021-11-08 NOTE — Therapy (Signed)
?OUTPATIENT PHYSICAL THERAPY TREATMENT NOTE ? ? ?Patient Name: Jason Stokes ?MRN: [id number] ?DOB:1961/01/10, 61 y.o., male ?Today's Date: 11/08/2021 ? ?PCP: Charlott Rakes, MD ?REFERRING PROVIDER: Charlott Rakes, MD ? ? PT End of Session - 11/08/21 1007   ? ? Visit Number 9   ? Number of Visits 12   ? Date for PT Re-Evaluation 12/01/21   ? Authorization Type Med Pay / MCD [place]   ? Authorization Time Period 09/23/2021 - 11/17/2021   ? Authorization - Visit Number 5   ? Authorization - Number of Visits 8   ? PT Start Time 1000   ? PT Stop Time 1048   ? PT Time Calculation (min) 48 min   ? Activity Tolerance Patient tolerated treatment well   ? Behavior During Therapy [facility] for tasks assessed/performed   ? ?  ?  ? ?  ? ? ? ? ? ? ? ? ?Past Medical History:  ?Diagnosis Date  ? Anxiety   ? Bilateral carpal tunnel syndrome 01/10/2018  ? Bipolar disorder ([place])   ? Chronic lower back pain   ? Depression   ? Dysrhythmia   ? a-fib  ? GERD (gastroesophageal reflux disease)   ? History of alcohol abuse   ? History of nuclear stress test   ? Myoview 10/16: EF 50%, diaphragmatic attenuation, no ischemia, low risk  ? Hypertension   ? Migraine 2012-2014  ? Moderate persistent asthma with acute exacerbation 05/02/2018  ? PAF (paroxysmal atrial fibrillation) ([place]) 03/27/2015  ? a. Myoview neg for ischemia >> Flecainide started 10/16 >> FU ETT   ? Schizophrenia ([place])   ? Sciatica neuralgia   ? Small vessel disease ([place])   ? Right basal ganglia stroke  ? Stroke [facility]) "between 2012-2014"  ? residual "AF" (09/22/2015)  ? ?Past Surgical History:  ?Procedure Laterality Date  ? ATRIAL FIBRILLATION ABLATION  09/22/2015  ? CYST EXCISION  1996-97  ? surgery back of head   ? ELECTROPHYSIOLOGIC STUDY N/A 09/22/2015  ? Procedure: Atrial Fibrillation Ablation;  Surgeon: Will [name], MD;  Location: [place] CV LAB;  Service: Cardiovascular;  Laterality: N/A;  ? ELECTROPHYSIOLOGIC STUDY N/A 12/10/2015  ? Procedure: Atrial Fibrillation Ablation;   Surgeon: Will [name], MD;  Location: [place] CV LAB;  Service: Cardiovascular;  Laterality: N/A;  ? ELECTROPHYSIOLOGIC STUDY N/A 12/11/2015  ? Procedure: Cardioversion;  Surgeon: Will [name], MD;  Location: [place] CV LAB;  Service: Cardiovascular;  Laterality: N/A;  ? EMBOLIZATION Right 08/19/2020  ? Procedure: EMBOLIZATION;  Surgeon: Cherre Robins, MD;  Location: [place] CV LAB;  Service: Cardiovascular;  Laterality: Right;  hypogastric  ? EXCISION MASS HEAD N/A 01/06/2017  ? Procedure: EXCISION MASS FOREHEAD;  Surgeon: Irene Limbo, MD;  Location: [place];  Service: Plastics;  Laterality: N/A;  ? GANGLION CYST EXCISION Left   ? INTERCOSTAL NERVE BLOCK  2005  ? KNEE ARTHROSCOPY Right 2016  ? MASS EXCISION N/A 01/25/2021  ? Procedure: EXCISION SUBCUTANEOUS VS SUBFASCIAL MASS TORSO 3CM;  Surgeon: Irene Limbo, MD;  Location: [place];  Service: Plastics;  Laterality: N/A;  ? ?Patient Active Problem List  ? Diagnosis Date Noted  ? Secondary hypercoagulable state ([place]) 03/23/2021  ? Type 2 diabetes mellitus with hyperglycemia, without long-term current use of insulin ([place]) 02/10/2021  ? Neck pain on left side 03/06/2019  ? Musculoskeletal chest pain 07/24/2018  ? Asthma, mild intermittent 05/02/2018  ? Allergic rhinitis caused by mold 05/02/2018  ?  Tobacco use 05/02/2018  ? Bilateral carpal tunnel syndrome 01/10/2018  ? Stroke [facility])   ? Schizophrenia ([place])   ? Migraine   ? History of nuclear stress test   ? History of alcohol abuse   ? GERD (gastroesophageal reflux disease)   ? Dysrhythmia   ? Chronic lower back pain   ? Anxiety   ? Arthritis of knee, right 04/12/2017  ? Bipolar disorder ([place]) 04/03/2017  ? Lipoma of forehead 09/22/2016  ? Trigger ring finger of right hand 06/22/2016  ? Paroxysmal atrial fibrillation (HCC)   ? AF (atrial fibrillation) ([place]) 12/10/2015  ? Eczema 07/10/2015  ? History of CVA (cerebrovascular accident) 05/04/2015  ?  Tear of medial meniscus of right knee 03/18/2015  ? Chronic pain of right knee 03/12/2015  ? Other and unspecified hyperlipidemia 11/25/2013  ? Nasal congestion 11/25/2013  ? Sinusitis, chronic 05/09/2013  ? Chronic low back pain 10/12/2012  ? Lumbar radiculopathy 10/12/2012  ? Hypertension 10/12/2012  ? Depression 10/12/2012  ? Insomnia 10/12/2012  ? ? ?REFERRING DIAG: Chronic midline low back pain with right-sided sciatica ? ?THERAPY DIAG:  ?Chronic bilateral low back pain, unspecified whether sciatica present ? ?Cervicalgia ? ?Muscle weakness (generalized) ? ?Other abnormalities of gait and mobility ? ?PERTINENT HISTORY: Chronic pain, bipolar, HTN, history of CVA, depression/anxiety, Type II DM ? ?PRECAUTIONS: fall ? ?SUBJECTIVE: Patient reports he was sore for a few days after last visit but he feels the dry needling continues to be beneficial and he is feeling better. He does report a little more pain this visit but feels it is due to his allergies. He did walk a mile and a half this past weekend.  ? ?PAIN:  ?Are you having pain? Yes ?NPRS scale: 7/10 ?Pain location: Neck ?Pain orientation: Right ?PAIN TYPE: Chronic ?Pain description: Constant, tight, pressure, sharp ?Aggravating factors: Moving neck too much to quickly  ?Relieving factors: Medication ? ?Are you having pain? Yes ?NPRS scale: 5/10 ?Pain location: Back ?Pain orientation: Lower, right ?PAIN TYPE: Chronic ?Pain description: Pressure, sharp ?Aggravating factors: Sitting extended periods, avoids bending, walking ?Relieving factors: Medication ? ? ?OBJECTIVE:  ?PATIENT SURVEYS:  ?Modified Oswestry 25/50 - 11/03/2021 ?  ?SENSATION: ?Light touch: Deficits patient reports generalized sensation deficit RLE  ?  ?MUSCLE LENGTH: ?Hamstring significant limitation bilaterally, when assessing right side patient began having spasm of right leg that resolved after a few seconds of patient relaxing ?  ?POSTURE:  ?Patient exhibits rounded shoulder and forward head  posture - 11/03/2021 ?  ?PALPATION: ?Generalized tenderness cervical and lumbar paraspinals, bilateral upper trap region  ?  ?LUMBAR AROM ?  ?AROM A/PROM  ?08/26/2021 A/PROM ?10/12/2021 AROM ?10/18/2021  ?11/03/2021  ?Flexion <25% 25% 50% 75%  ?Extension 0% 10%  25%  ?Right lateral flexion 25% 25%    ?Left lateral flexion 25% 25%    ?Right rotation 25% 50% 50% 75%  ?Left rotation 25% 50% 50% 75%  ?  ?Cervical AROM: ?  ?AROM A/PROM  ?08/26/2021  ?09/07/2021  ?09/13/2021  ?10/12/2021  ?11/03/2021  ?Flexion 15  20 25%   ?Extension 20  30 10%   ?Right lateral flexion 15  20 10%   ?Left lateral flexion 20  30 25%   ?Right rotation 20 30 35 25% 50  ?Left rotation         25         30         40       75% 65  ?  ?  LE MMT: ?  ?MMT Right ?08/26/2021 Left ?08/26/2021 Right / Left ?09/13/2021 Rt / Lt ?11/03/2021  ?Hip flexion 4- 4- 4- / 4- 4 / 4  ?Hip extension 3- 3- 3 / 3 3 / 3  ?Hip abduction 3- 3- 3 / 3 4- / 4-  ?Knee flexion '4 4 4 '$ / 4 4 / 4  ?Knee extension '4 4 4 '$ / 4 4 / 4  ?Gross periscapular musculature 4- 4- 4-  ? ?GAIT: ?Assistive device utilized: Single point cane ?Level of assistance: Modified independence ?Comments: Cane used on right, antalgic gait on right, decreased gait speed ?  ?  ?TODAY'S TREATMENT  ?[facility] Adult PT Treatment:                                                DATE: 11/08/2021 ? Therapeutic Exercise: ?NuStep L6 x 5 min with UE/LE while taking subjective ?LTR 10 x 5 sec each ?Piriformis stretch ?Sidelying thoracolumbar rotation 10 x 5 sec each ?Supine chin tuck 10 x 5 sec ?Supine horizontal abduction with green x 10 ?Bridge x 10 ?SLR x 10 ?Seated lumbar flexion stretch physioball roll out 10 x 5 sec ?Sit to stand x 10 ?Standing thoracic extension step back stretch 5 x 5 sec ?Manual: ?STM for cervical paraspinals and upper traps ?Suboccipital release with gentle manual traction x 3 bouts ?Passive upper trap and levator scap stretch ?Modalities: ?E-stim unattended x 10 min post session ?Premod 80-150 for bilat upper trap  region ?MHP applied throughout treatment ? ? ?[place] Adult PT Treatment:                                                DATE: 11/03/2021 ? Therapeutic Exercise: ?NuStep L5 x 5 min with UE/LE while taking subjec

## 2021-11-09 DIAGNOSIS — I480 Paroxysmal atrial fibrillation: Secondary | ICD-10-CM | POA: Diagnosis not present

## 2021-11-10 DIAGNOSIS — I480 Paroxysmal atrial fibrillation: Secondary | ICD-10-CM | POA: Diagnosis not present

## 2021-11-10 NOTE — Therapy (Signed)
?OUTPATIENT PHYSICAL THERAPY TREATMENT NOTE ? ?DISCHARGE ? ? ?Patient Name: Jason Stokes ?MRN: [id number] ?DOB:August 05, 1960, 61 y.o., male ?Today's Date: 11/15/2021 ? ?PCP: Charlott Rakes, MD ?REFERRING PROVIDER: Charlott Rakes, MD ? ? PT End of Session - 11/15/21 1005   ? ? Visit Number 10   ? Number of Visits 12   ? Date for PT Re-Evaluation 12/01/21   ? Authorization Type Med Pay / MCD [place]   ? Authorization Time Period 09/23/2021 - 11/17/2021   ? Authorization - Visit Number 6   ? Authorization - Number of Visits 8   ? PT Start Time 1000   ? PT Stop Time 1048   ? PT Time Calculation (min) 48 min   ? Activity Tolerance Patient tolerated treatment well   ? Behavior During Therapy [facility] for tasks assessed/performed   ? ?  ?  ? ?  ? ? ? ? ? ? ? ? ? ?Past Medical History:  ?Diagnosis Date  ? Anxiety   ? Bilateral carpal tunnel syndrome 01/10/2018  ? Bipolar disorder ([place])   ? Chronic lower back pain   ? Depression   ? Dysrhythmia   ? a-fib  ? GERD (gastroesophageal reflux disease)   ? History of alcohol abuse   ? History of nuclear stress test   ? Myoview 10/16: EF 50%, diaphragmatic attenuation, no ischemia, low risk  ? Hypertension   ? Migraine 2012-2014  ? Moderate persistent asthma with acute exacerbation 05/02/2018  ? PAF (paroxysmal atrial fibrillation) ([place]) 03/27/2015  ? a. Myoview neg for ischemia >> Flecainide started 10/16 >> FU ETT   ? Schizophrenia ([place])   ? Sciatica neuralgia   ? Small vessel disease ([place])   ? Right basal ganglia stroke  ? Stroke [facility]) "between 2012-2014"  ? residual "AF" (09/22/2015)  ? ?Past Surgical History:  ?Procedure Laterality Date  ? ATRIAL FIBRILLATION ABLATION  09/22/2015  ? CYST EXCISION  1996-97  ? surgery back of head   ? ELECTROPHYSIOLOGIC STUDY N/A 09/22/2015  ? Procedure: Atrial Fibrillation Ablation;  Surgeon: Will [name], MD;  Location: [place] [facility];  Service: Cardiovascular;  Laterality: N/A;  ? ELECTROPHYSIOLOGIC STUDY N/A 12/10/2015  ? Procedure: Atrial Fibrillation  Ablation;  Surgeon: Will [name], MD;  Location: [place] [facility];  Service: Cardiovascular;  Laterality: N/A;  ? ELECTROPHYSIOLOGIC STUDY N/A 12/11/2015  ? Procedure: Cardioversion;  Surgeon: Will [name], MD;  Location: [place] [facility];  Service: Cardiovascular;  Laterality: N/A;  ? EMBOLIZATION Right 08/19/2020  ? Procedure: EMBOLIZATION;  Surgeon: Cherre Robins, MD;  Location: [place] [facility];  Service: Cardiovascular;  Laterality: Right;  hypogastric  ? EXCISION MASS HEAD N/A 01/06/2017  ? Procedure: EXCISION MASS FOREHEAD;  Surgeon: Irene Limbo, MD;  Location: [place];  Service: Plastics;  Laterality: N/A;  ? GANGLION CYST EXCISION Left   ? INTERCOSTAL NERVE BLOCK  2005  ? KNEE ARTHROSCOPY Right 2016  ? MASS EXCISION N/A 01/25/2021  ? Procedure: EXCISION SUBCUTANEOUS VS SUBFASCIAL MASS TORSO 3CM;  Surgeon: Irene Limbo, MD;  Location: [place];  Service: Plastics;  Laterality: N/A;  ? ?Patient Active Problem List  ? Diagnosis Date Noted  ? Secondary hypercoagulable state ([place]) 03/23/2021  ? Type 2 diabetes mellitus with hyperglycemia, without long-term current use of insulin ([place]) 02/10/2021  ? Neck pain on left side 03/06/2019  ? Musculoskeletal chest pain 07/24/2018  ? Asthma, mild intermittent 05/02/2018  ? Allergic rhinitis caused by  mold 05/02/2018  ? Tobacco use 05/02/2018  ? Bilateral carpal tunnel syndrome 01/10/2018  ? Stroke [facility])   ? Schizophrenia ([place])   ? Migraine   ? History of nuclear stress test   ? History of alcohol abuse   ? GERD (gastroesophageal reflux disease)   ? Dysrhythmia   ? Chronic lower back pain   ? Anxiety   ? Arthritis of knee, right 04/12/2017  ? Bipolar disorder ([place]) 04/03/2017  ? Lipoma of forehead 09/22/2016  ? Trigger ring finger of right hand 06/22/2016  ? Paroxysmal atrial fibrillation (HCC)   ? AF (atrial fibrillation) ([place]) 12/10/2015  ? Eczema 07/10/2015  ? History of CVA (cerebrovascular accident)  05/04/2015  ? Tear of medial meniscus of right knee 03/18/2015  ? Chronic pain of right knee 03/12/2015  ? Other and unspecified hyperlipidemia 11/25/2013  ? Nasal congestion 11/25/2013  ? Sinusitis, chronic 05/09/2013  ? Chronic low back pain 10/12/2012  ? Lumbar radiculopathy 10/12/2012  ? Hypertension 10/12/2012  ? Depression 10/12/2012  ? Insomnia 10/12/2012  ? ? ?REFERRING DIAG: Chronic midline low back pain with right-sided sciatica ? ?THERAPY DIAG:  ?Chronic bilateral low back pain, unspecified whether sciatica present ? ?Cervicalgia ? ?Muscle weakness (generalized) ? ?Other abnormalities of gait and mobility ? ?PERTINENT HISTORY: Chronic pain, bipolar, HTN, history of CVA, depression/anxiety, Type II DM ? ?PRECAUTIONS: fall ? ?SUBJECTIVE: Patient reports he continues to improve. His neck does occasionally give him problems but his low back is doing pretty good. ? ?PAIN:  ?Are you having pain? Yes ?NPRS scale: 4/10 ?Pain location: Neck ?Pain orientation: Right ?PAIN TYPE: Chronic ?Pain description: Constant, tight, pressure, sharp ?Aggravating factors: Moving neck too much to quickly  ?Relieving factors: Medication ? ?Are you having pain? Yes ?NPRS scale: 3/10 ?Pain location: Back ?Pain orientation: Lower, right ?PAIN TYPE: Chronic ?Pain description: Pressure, sharp ?Aggravating factors: Sitting extended periods, avoids bending, walking ?Relieving factors: Medication ? ? ?OBJECTIVE:  ?PATIENT SURVEYS:  ?Modified Oswestry 25/50 - 11/03/2021 ? 11/15/2021: 18/50 ?  ?SENSATION: ?Light touch: Deficits patient reports generalized sensation deficit RLE  ?  ?MUSCLE LENGTH: ?Hamstring significant limitation bilaterally, when assessing right side patient began having spasm of right leg that resolved after a few seconds of patient relaxing ?  ?POSTURE:  ?Patient exhibits rounded shoulder and forward head posture - 11/03/2021 ?  ?PALPATION: ?Generalized tenderness cervical and lumbar paraspinals, bilateral upper trap region   ?  ?LUMBAR AROM ?  ?AROM A/PROM  ?08/26/2021 A/PROM ?10/12/2021 AROM ?10/18/2021  ?11/03/2021  ?11/15/2021  ?Flexion <25% 25% 50% 75% 75%  ?Extension 0% 10%  25% 25%  ?Right lateral flexion 25% 25%   50%  ?Left lateral flexion 25% 25%   50%  ?Right rotation 25% 50% 50% 75% 75%  ?Left rotation 25% 50% 50% 75% 75%  ?  ?Cervical AROM: ?  ?AROM A/PROM  ?08/26/2021  ?09/13/2021  ?10/12/2021  ?11/03/2021  ?11/15/2021  ?Flexion 15 20 25%  40  ?Extension 20 30 10%  35  ?Right lateral flexion 15 20 10%  25  ?Left lateral flexion 20 30 25%  30  ?Right rotation 20 35 25% 50 60  ?Left rotation         25         40       75% 65 70  ?  ?LE MMT: ?  ?MMT Right ?08/26/2021 Left ?08/26/2021 Right / Left ?09/13/2021 Rt / Lt ?11/03/2021  ?Hip flexion 4- 4- 4- / 4- 4 /  4  ?Hip extension 3- 3- 3 / 3 3 / 3  ?Hip abduction 3- 3- 3 / 3 4- / 4-  ?Knee flexion '4 4 4 '$ / 4 4 / 4  ?Knee extension '4 4 4 '$ / 4 4 / 4  ?Gross periscapular musculature 4- 4- 4-  ? ?GAIT: ?Assistive device utilized: Single point cane ?Level of assistance: Modified independence ?Comments: Cane used on right, antalgic gait on right, decreased gait speed ?  ?  ?TODAY'S TREATMENT  ?[facility] Adult PT Treatment:                                                DATE: 11/15/2021 ? Therapeutic Exercise: ?NuStep L6 x 5 min with UE/LE while taking subjective ?LTR 10 x 5 sec each ?Piriformis stretch 2 x 20 sec each ?Sidelying thoracolumbar rotation 10 x 5 sec each ?Supine chin tuck 10 x 5 sec ?Supine horizontal abduction with green x 10 ?Bridge x 10 ?SLR x 10 ?Seated lumbar flexion stretch physioball roll out 10 x 5 sec ?Sit to stand x 10 ?Standing thoracic extension step back stretch 5 x 5 sec ?Manual: ?Suboccipital release with gentle manual traction x 3 bouts ?Passive upper trap and levator scap stretch ?Modalities: ?E-stim unattended x 10 min post session ?Premod 80-150 for bilat upper trap region ?MHP applied throughout treatment ? ? ?[place] Adult PT Treatment:                                                 DATE: 11/08/2021 ? Therapeutic Exercise: ?NuStep L6 x 5 min with UE/LE while taking subjective ?LTR 10 x 5 sec each ?Piriformis stretch ?Sidelying thoracolumbar rotation 10 x 5 sec each ?Supine chin tuck 1

## 2021-11-11 ENCOUNTER — Ambulatory Visit (INDEPENDENT_AMBULATORY_CARE_PROVIDER_SITE_OTHER): Payer: Medicaid Other

## 2021-11-11 DIAGNOSIS — J309 Allergic rhinitis, unspecified: Secondary | ICD-10-CM | POA: Diagnosis not present

## 2021-11-11 DIAGNOSIS — I480 Paroxysmal atrial fibrillation: Secondary | ICD-10-CM | POA: Diagnosis not present

## 2021-11-12 DIAGNOSIS — I480 Paroxysmal atrial fibrillation: Secondary | ICD-10-CM | POA: Diagnosis not present

## 2021-11-13 DIAGNOSIS — I480 Paroxysmal atrial fibrillation: Secondary | ICD-10-CM | POA: Diagnosis not present

## 2021-11-14 DIAGNOSIS — I480 Paroxysmal atrial fibrillation: Secondary | ICD-10-CM | POA: Diagnosis not present

## 2021-11-15 ENCOUNTER — Other Ambulatory Visit: Payer: Self-pay

## 2021-11-15 ENCOUNTER — Ambulatory Visit: Payer: Medicaid Other | Attending: Family Medicine | Admitting: Physical Therapy

## 2021-11-15 ENCOUNTER — Encounter: Payer: Self-pay | Admitting: Physical Therapy

## 2021-11-15 DIAGNOSIS — G8929 Other chronic pain: Secondary | ICD-10-CM | POA: Insufficient documentation

## 2021-11-15 DIAGNOSIS — M6281 Muscle weakness (generalized): Secondary | ICD-10-CM | POA: Insufficient documentation

## 2021-11-15 DIAGNOSIS — M545 Low back pain, unspecified: Secondary | ICD-10-CM | POA: Insufficient documentation

## 2021-11-15 DIAGNOSIS — M542 Cervicalgia: Secondary | ICD-10-CM | POA: Insufficient documentation

## 2021-11-15 DIAGNOSIS — I480 Paroxysmal atrial fibrillation: Secondary | ICD-10-CM | POA: Diagnosis not present

## 2021-11-15 DIAGNOSIS — R2689 Other abnormalities of gait and mobility: Secondary | ICD-10-CM | POA: Diagnosis not present

## 2021-11-16 DIAGNOSIS — I480 Paroxysmal atrial fibrillation: Secondary | ICD-10-CM | POA: Diagnosis not present

## 2021-11-17 DIAGNOSIS — I480 Paroxysmal atrial fibrillation: Secondary | ICD-10-CM | POA: Diagnosis not present

## 2021-11-18 DIAGNOSIS — I480 Paroxysmal atrial fibrillation: Secondary | ICD-10-CM | POA: Diagnosis not present

## 2021-11-19 ENCOUNTER — Ambulatory Visit (INDEPENDENT_AMBULATORY_CARE_PROVIDER_SITE_OTHER): Payer: Medicaid Other

## 2021-11-19 DIAGNOSIS — J309 Allergic rhinitis, unspecified: Secondary | ICD-10-CM

## 2021-11-20 DIAGNOSIS — I480 Paroxysmal atrial fibrillation: Secondary | ICD-10-CM | POA: Diagnosis not present

## 2021-11-21 DIAGNOSIS — I480 Paroxysmal atrial fibrillation: Secondary | ICD-10-CM | POA: Diagnosis not present

## 2021-11-22 ENCOUNTER — Telehealth: Payer: Self-pay | Admitting: *Deleted

## 2021-11-22 DIAGNOSIS — I480 Paroxysmal atrial fibrillation: Secondary | ICD-10-CM | POA: Diagnosis not present

## 2021-11-22 DIAGNOSIS — Z7901 Long term (current) use of anticoagulants: Secondary | ICD-10-CM | POA: Diagnosis not present

## 2021-11-22 DIAGNOSIS — Z1211 Encounter for screening for malignant neoplasm of colon: Secondary | ICD-10-CM | POA: Diagnosis not present

## 2021-11-22 DIAGNOSIS — Z800 Family history of malignant neoplasm of digestive organs: Secondary | ICD-10-CM | POA: Diagnosis not present

## 2021-11-22 NOTE — Telephone Encounter (Signed)
? ?  Pre-operative Risk Assessment  ?  ?Patient Name: Jason Stokes  ?DOB: [birth year] ?MRN: [id number]  ? ?  ? ?Request for Surgical Clearance   ? ?Procedure:   Screen for colon cancer. ? ?Date of Surgery:  Clearance 12/16/21                              ?   ?Surgeon:  Dr. Michail Sermon ?Surgeon's Group or Practice Name:  [facility]. ?Phone number:  [phone] ?Fax number:  [phone] ?  ?Type of Clearance Requested:   ?- Pharmacy:  Hold Apixaban (Eliquis) Not indicated.  ?  ?Type of Anesthesia:   Propofol. ?  ?Additional requests/questions:   ? ?Signed, ?Audley Hinojos Avanell Shackleton   ?11/22/2021, 3:17 PM   ?

## 2021-11-23 ENCOUNTER — Ambulatory Visit: Payer: Medicaid Other | Admitting: Allergy and Immunology

## 2021-11-23 ENCOUNTER — Encounter: Payer: Self-pay | Admitting: Allergy and Immunology

## 2021-11-23 VITALS — BP 138/80 | HR 76 | Temp 98.2°F | Resp 18 | Ht 73.0 in | Wt 252.4 lb

## 2021-11-23 DIAGNOSIS — J301 Allergic rhinitis due to pollen: Secondary | ICD-10-CM

## 2021-11-23 DIAGNOSIS — I480 Paroxysmal atrial fibrillation: Secondary | ICD-10-CM | POA: Diagnosis not present

## 2021-11-23 DIAGNOSIS — H1010 Acute atopic conjunctivitis, unspecified eye: Secondary | ICD-10-CM

## 2021-11-23 DIAGNOSIS — J4540 Moderate persistent asthma, uncomplicated: Secondary | ICD-10-CM | POA: Diagnosis not present

## 2021-11-23 DIAGNOSIS — H1013 Acute atopic conjunctivitis, bilateral: Secondary | ICD-10-CM

## 2021-11-23 DIAGNOSIS — J3089 Other allergic rhinitis: Secondary | ICD-10-CM

## 2021-11-23 MED ORDER — ALBUTEROL SULFATE HFA 108 (90 BASE) MCG/ACT IN AERS: 2.0000 | INHALATION_SPRAY | RESPIRATORY_TRACT | 1 refills | Status: DC | PRN

## 2021-11-23 MED ORDER — MONTELUKAST SODIUM 10 MG PO TABS: 10.0000 mg | ORAL_TABLET | Freq: Every day | ORAL | 5 refills | Status: DC

## 2021-11-23 MED ORDER — AZELASTINE HCL 0.1 % NA SOLN
2.0000 | Freq: Two times a day (BID) | NASAL | 5 refills | Status: DC
Start: 2021-11-23 — End: 2022-05-31

## 2021-11-23 MED ORDER — CETIRIZINE HCL 10 MG PO TABS: 10.0000 mg | ORAL_TABLET | Freq: Two times a day (BID) | ORAL | 5 refills | Status: DC | PRN

## 2021-11-23 MED ORDER — FLUTICASONE PROPIONATE 50 MCG/ACT NA SUSP: 1.0000 | Freq: Two times a day (BID) | NASAL | 5 refills | Status: DC

## 2021-11-23 MED ORDER — OLOPATADINE HCL 0.2 % OP SOLN: 1.0000 [drp] | Freq: Every day | OPHTHALMIC | 5 refills | Status: DC | PRN

## 2021-11-23 MED ORDER — METHYLPREDNISOLONE ACETATE 80 MG/ML IJ SUSP
80.0000 mg | Freq: Once | INTRAMUSCULAR | Status: AC
  Administered 2021-11-23: 80 mg via INTRAMUSCULAR

## 2021-11-23 MED ORDER — FLUTICASONE FUROATE-VILANTEROL 200-25 MCG/ACT IN AEPB: 1.0000 | INHALATION_SPRAY | Freq: Every day | RESPIRATORY_TRACT | 5 refills | Status: DC

## 2021-11-23 NOTE — Progress Notes (Signed)
? ?[place] ? ? ?Follow-up Note ? ?Referring Provider: Charlott Rakes, MD ?Primary Provider: Charlott Rakes, MD ?Date of Office Visit: 11/23/2021 ? ?Subjective:  ? ?Jason Stokes (DOB: 08-24-1960) is a 61 y.o. male who returns to the [facility] on 11/23/2021 in re-evaluation of the following: ? ?HPI: Jason Stokes returns to this clinic in reevaluation of asthma, allergic rhinoconjunctivitis, tobacco/cannabis smoking.  I last saw him in this clinic on 18 May 2021. ? ?He has been having problems with his eyes and nose since the pollen has arrived even though he has been consistently using a collection of anti-inflammatory agents for his upper airway and eyes and continues on immunotherapy currently at every week without adverse effect.  It should be noted that he has been prescribed topical steroid eyedrops by his ophthalmologist which he uses consistently at least twice a day.  He has not had any fever or ugly nasal discharge or bed headaches or symptoms suggesting an ongoing infection of his upper airway. ? ?He has had very little issues with his lower airways.  He does not use a short acting bronchodilator.  He does not have any coughing or wheezing.  He continues to use his [employer] on a daily basis.  He continues to smoke cannabis and rarely smokes a tobacco cigarette. ? ?It does not sound as though he has required a systemic steroid or an antibiotic for any type of airway issue since I have last seen him in this clinic. ? ?He informs me that he does not have diabetes.  Apparently in the past he was diagnosed with diabetes and was given various medications for this condition but he had side effects of his medications and he no longer uses any medications for diabetes and he says that his blood sugars are always below 160 whenever he checks his blood sugar.  He does have a blood test obtained on 29 September 2021 which identified glucose 136 mg/DL.  He has  a hemoglobin A1c on 29 September 2021 of 5.8%. ? ?Allergies as of 11/23/2021   ? ?   Reactions  ? Lisinopril Anaphylaxis  ? Swelling of lips and tongue.  ? ?  ? ?  ?Medication List  ? ? ?Accu-Chek Aviva Plus test strip ?Generic drug: glucose blood ?Use as instructed 3 times daily before meals ?  ?Accu-Chek Aviva Plus w/Device Kit ?Use 3 times daily before meals ?  ?Accu-Chek Softclix Lancets lancets ?Use as instructed 3 times daily before meals ?  ?Advair Diskus 500-50 MCG/ACT Aepb ?Generic drug: fluticasone-salmeterol ?Inhale 1 puff into the lungs daily. ?  ?albuterol 108 (90 Base) MCG/ACT inhaler ?Commonly known as: Ventolin HFA ?Inhale 2 puffs into the lungs every 4 (four) hours as needed for wheezing or shortness of breath. ?  ?Alphagan P 0.1 % Soln ?Generic drug: brimonidine ?Place 1 drop into the left eye 2 (two) times daily. ?  ?atorvastatin 40 MG tablet ?Commonly known as: LIPITOR ?Take 1 tablet (40 mg total) by mouth daily. ?  ?Azelastine HCl 0.15 % Soln ?Place 1 spray into both nostrils 2 (two) times daily. ?  ?azelastine 0.1 % nasal spray ?Commonly known as: ASTELIN ?Place 2 sprays into both nostrils 2 (two) times daily. Use in each nostril as directed ?  ?baclofen 10 MG tablet ?Commonly known as: LIORESAL ?Take 10 mg by mouth 3 (three) times daily as needed for muscle pain. ?  ?carvedilol 25 MG tablet ?Commonly known as:  COREG ?TAKE 1 TABLET BY MOUTH TWICE A DAY ?  ?cetirizine 10 MG tablet ?Commonly known as: ZYRTEC ?Take 1 tablet (10 mg total) by mouth daily. ?  ?Cymbalta 30 MG capsule ?Generic drug: DULoxetine ?Take 30 mg by mouth at bedtime. ?  ?diltiazem 180 MG 24 hr capsule ?Commonly known as: CARDIZEM CD ?TAKE 1 CAPSULE BY MOUTH EVERY DAY ?  ?dronedarone 400 MG tablet ?Commonly known as: MULTAQ ?Take 1 tablet (400 mg total) by mouth 2 (two) times daily with a meal. ?  ?Eliquis 5 MG Tabs tablet ?Generic drug: apixaban ?TAKE 1 TABLET BY MOUTH TWICE A DAY ?  ?fluticasone 50 MCG/ACT nasal spray ?Commonly  known as: FLONASE ?Place 1 spray into both nostrils 2 (two) times daily. ?  ?fluticasone furoate-vilanterol 200-25 MCG/ACT Aepb ?Commonly known as: Breo Ellipta ?Inhale 1 puff into the lungs daily. ?  ?folic acid 1 MG tablet ?Commonly known as: FOLVITE ?Take 1 mg by mouth daily. ?  ?furosemide 20 MG tablet ?Commonly known as: LASIX ?Take 1 tablet daily, as needed, for swelling ?  ?GARLIC PO ?Take 1 tablet by mouth daily as needed (To build immune system up). ?  ?hydrALAZINE 100 MG tablet ?Commonly known as: APRESOLINE ?TAKE 1 TABLET (100 MG TOTAL) BY MOUTH 3 (THREE) TIMES DAILY. ?  ?methocarbamol 500 MG tablet ?Commonly known as: Robaxin ?Take 1 tablet (500 mg total) by mouth every 8 (eight) hours as needed for muscle spasms. ?  ?methotrexate 2.5 MG tablet ?Commonly known as: RHEUMATREX ?Take 2.5 mg by mouth daily. ?  ?Misc. Devices Misc ?Back brace. Diagnosis chronic back pain ?  ?Misc. Devices Misc ?Left knee brace. Diagnosis L knee pain ?  ?montelukast 10 MG tablet ?Commonly known as: Singulair ?Take 1 tablet (10 mg total) by mouth at bedtime. ?  ?naloxone 4 MG/0.1ML Liqd nasal spray kit ?Commonly known as: NARCAN ?Place 1 spray into the nose daily as needed for opioid reversal. ?  ?Olopatadine HCl 0.2 % Soln ?Commonly known as: Pataday ?Place 1 drop into both eyes daily as needed. ?  ?omeprazole 20 MG capsule ?Commonly known as: PRILOSEC ?TAKE 1 CAPSULE BY MOUTH EVERY DAY ?  ?Oxycodone HCl 10 MG Tabs ?Take 1 tablet (10 mg total) by mouth 4 (four) times daily as needed (Pain). ?  ?prazosin 1 MG capsule ?Commonly known as: MINIPRESS ?Take 1 mg by mouth at bedtime. ?  ?prednisoLONE acetate 1 % ophthalmic suspension ?Commonly known as: PRED FORTE ?Place 1 drop into the left eye 6 (six) times daily. ?  ?predniSONE 20 MG tablet ?Commonly known as: DELTASONE ?Take 1 tablet (20 mg total) by mouth daily with breakfast. ?  ?pregabalin 75 MG capsule ?Commonly known as: LYRICA ?Take 1 capsule by mouth 4 (four) times daily  as needed (pain). ?  ?Simbrinza 1-0.2 % Susp ?Generic drug: Brinzolamide-Brimonidine ?Place 1 drop into both eyes daily as needed for allergies. ?  ?Vitamin D3 25 MCG tablet ?Commonly known as: Vitamin D ?Take 1,000 Units by mouth daily. ?  ? ?Past Medical History:  ?Diagnosis Date  ? Anxiety   ? Bilateral carpal tunnel syndrome 01/10/2018  ? Bipolar disorder ([place])   ? Chronic lower back pain   ? Depression   ? Dysrhythmia   ? a-fib  ? GERD (gastroesophageal reflux disease)   ? History of alcohol abuse   ? History of nuclear stress test   ? Myoview 10/16: EF 50%, diaphragmatic attenuation, no ischemia, low risk  ? Hypertension   ? Migraine  2012-2014  ? Moderate persistent asthma with acute exacerbation 05/02/2018  ? PAF (paroxysmal atrial fibrillation) ([place]) 03/27/2015  ? a. Myoview neg for ischemia >> Flecainide started 10/16 >> FU ETT   ? Schizophrenia ([place])   ? Sciatica neuralgia   ? Small vessel disease ([place])   ? Right basal ganglia stroke  ? Stroke [facility]) "between 2012-2014"  ? residual "AF" (09/22/2015)  ? ? ?Past Surgical History:  ?Procedure Laterality Date  ? ATRIAL FIBRILLATION ABLATION  09/22/2015  ? CYST EXCISION  1996-97  ? surgery back of head   ? ELECTROPHYSIOLOGIC STUDY N/A 09/22/2015  ? Procedure: Atrial Fibrillation Ablation;  Surgeon: Will [name], MD;  Location: [place] [facility];  Service: Cardiovascular;  Laterality: N/A;  ? ELECTROPHYSIOLOGIC STUDY N/A 12/10/2015  ? Procedure: Atrial Fibrillation Ablation;  Surgeon: Will [name], MD;  Location: [place] [facility];  Service: Cardiovascular;  Laterality: N/A;  ? ELECTROPHYSIOLOGIC STUDY N/A 12/11/2015  ? Procedure: Cardioversion;  Surgeon: Will [name], MD;  Location: [place] [facility];  Service: Cardiovascular;  Laterality: N/A;  ? EMBOLIZATION Right 08/19/2020  ? Procedure: EMBOLIZATION;  Surgeon: Cherre Robins, MD;  Location: [name] [facility];  Service: Cardiovascular;  Laterality: Right;  hypogastric  ? EXCISION MASS HEAD  N/A 01/06/2017  ? Procedure: EXCISION MASS FOREHEAD;  Surgeon: Irene Limbo, MD;  Location: [place];  Service: Plastics;  Laterality: N/A;  ? GANGLION CYST EXCISION Left   ? Armanda Heritage

## 2021-11-23 NOTE — Patient Instructions (Addendum)
?  1.  Continue to Treat and prevent inflammation: ? ? A.  Flonase 1 spray each nostril twice a day ? B.  Azelastine nasal spray 1 spray each nostril twice a day ? C.  Montelukast 10 mg 1 tablet 1 time per day ? D.  Breo 200 - 1 inhalation 1 time per day ? E.  Immunotherapy ? ?2. If needed: ? ? A.  Cetirizine 10 mg 1 tablet 1-2 times a day ? B.  Pataday - 1 drop each eye 1 time per day ? C.  Pro-air HFA 2 puffs every 4-6 hours ? ?3.  For this recent springtime episode: ? ? A.  Depo-Medrol 80 IM delivered in clinic today ? B.  Blood sugars??? ? ?4. Return to clinic in 6 months or earlier if problem ? ?  ?

## 2021-11-23 NOTE — Telephone Encounter (Signed)
Patient with diagnosis of afib on Eliquis for anticoagulation.   ? ?Procedure: screen for colon cancer ?Date of procedure: 12/16/21 ? ?CHA2DS2-VASc Score = 4  ?This indicates a 4.8% annual risk of stroke. ?The patient's score is based upon: ?CHF History: 0 ?HTN History: 1 ?Diabetes History: 1 ?Stroke History: 2 ?Vascular Disease History: 0 ?Age Score: 0 ?Gender Score: 0 ?  ?CrCl 88 mL/min using adjusted body weight due to obesity ?Platelet count 190K ? ?Assuming this request is for a colonoscopy, recommend patient hold Eliquis for 1 day prior to procedure. He should resume as soon as safely possible after given his elevated CV risk. ?

## 2021-11-24 ENCOUNTER — Encounter: Payer: Self-pay | Admitting: Allergy and Immunology

## 2021-11-24 DIAGNOSIS — I480 Paroxysmal atrial fibrillation: Secondary | ICD-10-CM | POA: Diagnosis not present

## 2021-11-24 NOTE — Telephone Encounter (Signed)
Left message for the pt to call the office for tele pre op appt 

## 2021-11-24 NOTE — Telephone Encounter (Signed)
Patient will need a virtual telephone visit ?

## 2021-11-25 ENCOUNTER — Telehealth: Payer: Self-pay | Admitting: *Deleted

## 2021-11-25 ENCOUNTER — Ambulatory Visit (INDEPENDENT_AMBULATORY_CARE_PROVIDER_SITE_OTHER): Payer: Medicaid Other | Admitting: General Practice

## 2021-11-25 DIAGNOSIS — Z01810 Encounter for preprocedural cardiovascular examination: Secondary | ICD-10-CM | POA: Diagnosis not present

## 2021-11-25 DIAGNOSIS — I480 Paroxysmal atrial fibrillation: Secondary | ICD-10-CM | POA: Diagnosis not present

## 2021-11-25 NOTE — Telephone Encounter (Signed)
Pt agreeable to plan of care for tele pre op appt today at 4 pm. Med rec and consent are done. ? ?  ?Patient Consent for Virtual Visit  ? ? ?   ? ?Jason Stokes has provided verbal consent on 11/25/2021 for a virtual visit (video or telephone). ? ? ?CONSENT FOR VIRTUAL VISIT FOR:  Jason Stokes  ?By participating in this virtual visit I agree to the following: ? ?I hereby voluntarily request, consent and authorize [facility] and its employed or contracted physicians, physician assistants, nurse practitioners or other licensed health care professionals (the Practitioner), to provide me with telemedicine health care services (the ?Services") as deemed necessary by the treating Practitioner. I acknowledge and consent to receive the Services by the Practitioner via telemedicine. I understand that the telemedicine visit will involve communicating with the Practitioner through live audiovisual communication technology and the disclosure of certain medical information by electronic transmission. I acknowledge that I have been given the opportunity to request an in-person assessment or other available alternative prior to the telemedicine visit and am voluntarily participating in the telemedicine visit. ? ?I understand that I have the right to withhold or withdraw my consent to the use of telemedicine in the course of my care at any time, without affecting my right to future care or treatment, and that the Practitioner or I may terminate the telemedicine visit at any time. I understand that I have the right to inspect all information obtained and/or recorded in the course of the telemedicine visit and may receive copies of available information for a reasonable fee.  I understand that some of the potential risks of receiving the Services via telemedicine include:  ?Delay or interruption in medical evaluation due to technological equipment failure or disruption; ?Information transmitted may not be sufficient (e.g. poor  resolution of images) to allow for appropriate medical decision making by the Practitioner; and/or  ?In rare instances, security protocols could fail, causing a breach of personal health information. ? ?Furthermore, I acknowledge that it is my responsibility to provide information about my medical history, conditions and care that is complete and accurate to the best of my ability. I acknowledge that Practitioner's advice, recommendations, and/or decision may be based on factors not within their control, such as incomplete or inaccurate data provided by me or distortions of diagnostic images or specimens that may result from electronic transmissions. I understand that the practice of medicine is not an exact science and that Practitioner makes no warranties or guarantees regarding treatment outcomes. I acknowledge that a copy of this consent can be made available to me via my patient portal ([place]), or I can request a printed copy by calling the office of [place].   ? ?I understand that my insurance will be billed for this visit.  ? ?I have read or had this consent read to me. ?I understand the contents of this consent, which adequately explains the benefits and risks of the Services being provided via telemedicine.  ?I have been provided ample opportunity to ask questions regarding this consent and the Services and have had my questions answered to my satisfaction. ?I give my informed consent for the services to be provided through the use of telemedicine in my medical care ? ? ? ?

## 2021-11-25 NOTE — Progress Notes (Signed)
? ?Virtual Visit via Telephone Note  ? ?This visit type was conducted due to national recommendations for restrictions regarding the COVID-19 Pandemic (e.g. social distancing) in an effort to limit this patient's exposure and mitigate transmission in our community.  Due to his co-morbid illnesses, this patient is at least at moderate risk for complications without adequate follow up.  This format is felt to be most appropriate for this patient at this time.  The patient did not have access to video technology/had technical difficulties with video requiring transitioning to audio format only (telephone).  All issues noted in this document were discussed and addressed.  No physical exam could be performed with this format.  Please refer to the patient's chart for his  consent to telehealth for [facility]. ? ?Evaluation Performed:  Preoperative cardiovascular risk assessment ?_____________  ? ?Date:  11/25/2021  ? ?Patient ID:  Jason Stokes, DOB [birth year], MRN [id number] ?Patient Location:  ?Home ?Provider location:   ?Office ? ?Primary Care Provider:  Charlott Rakes, MD ?Primary Cardiologist:  Will [name], MD ? ?Chief Complaint  ?  ?61 y.o. y/o male with a h/o paroxysmal atrial fibrillation, hypertension, CVA, who is pending colon cancer screening, and presents today for telephonic preoperative cardiovascular risk assessment. ? ?Past Medical History  ?  ?Past Medical History:  ?Diagnosis Date  ? Anxiety   ? Bilateral carpal tunnel syndrome 01/10/2018  ? Bipolar disorder ([place])   ? Chronic lower back pain   ? Depression   ? Dysrhythmia   ? a-fib  ? GERD (gastroesophageal reflux disease)   ? History of alcohol abuse   ? History of nuclear stress test   ? Myoview 10/16: EF 50%, diaphragmatic attenuation, no ischemia, low risk  ? Hypertension   ? Migraine 2012-2014  ? Moderate persistent asthma with acute exacerbation 05/02/2018  ? PAF (paroxysmal atrial fibrillation) ([place]) 03/27/2015  ? a. Myoview neg for  ischemia >> Flecainide started 10/16 >> FU ETT   ? Schizophrenia ([place])   ? Sciatica neuralgia   ? Small vessel disease ([place])   ? Right basal ganglia stroke  ? Stroke [facility]) "between 2012-2014"  ? residual "AF" (09/22/2015)  ? ?Past Surgical History:  ?Procedure Laterality Date  ? ATRIAL FIBRILLATION ABLATION  09/22/2015  ? CYST EXCISION  1996-97  ? surgery back of head   ? ELECTROPHYSIOLOGIC STUDY N/A 09/22/2015  ? Procedure: Atrial Fibrillation Ablation;  Surgeon: Will [name], MD;  Location: [place] [facility];  Service: Cardiovascular;  Laterality: N/A;  ? ELECTROPHYSIOLOGIC STUDY N/A 12/10/2015  ? Procedure: Atrial Fibrillation Ablation;  Surgeon: Will [name], MD;  Location: [place] [facility];  Service: Cardiovascular;  Laterality: N/A;  ? ELECTROPHYSIOLOGIC STUDY N/A 12/11/2015  ? Procedure: Cardioversion;  Surgeon: Will [name], MD;  Location: [place] [facility];  Service: Cardiovascular;  Laterality: N/A;  ? EMBOLIZATION Right 08/19/2020  ? Procedure: EMBOLIZATION;  Surgeon: Cherre Robins, MD;  Location: [place] [facility];  Service: Cardiovascular;  Laterality: Right;  hypogastric  ? EXCISION MASS HEAD N/A 01/06/2017  ? Procedure: EXCISION MASS FOREHEAD;  Surgeon: Irene Limbo, MD;  Location: [place];  Service: Plastics;  Laterality: N/A;  ? GANGLION CYST EXCISION Left   ? INTERCOSTAL NERVE BLOCK  2005  ? KNEE ARTHROSCOPY Right 2016  ? MASS EXCISION N/A 01/25/2021  ? Procedure: EXCISION SUBCUTANEOUS VS SUBFASCIAL MASS TORSO 3CM;  Surgeon: Irene Limbo, MD;  Location: [place];  Service: Plastics;  Laterality: N/A;  ? ? ?Allergies ? ?Allergies  ?Allergen Reactions  ? Lisinopril Anaphylaxis  ?  Swelling of lips and tongue.  ? ? ?History of Present Illness  ?  ?Jason Stokes is a 61 y.o. male who presents via audio/video conferencing for a telehealth visit today.  Pt was last seen in cardiology clinic on 02/09/2021 by Dr. Curt Bears.  At that time Jason Stokes was doing well .  The patient is now pending procedure as outlined above. Since his last visit, he remains stable from a cardiac standpoint. ? ?Today he denies chest pain, shortness of breath, lower extremity edema, fatigue, palpitations, melena, hematuria, hemoptysis, diaphoresis, weakness, presyncope, syncope, orthopnea, and PND. ? ? ? ?Home Medications  ?  ?Prior to Admission medications   ?Medication Sig Start Date End Date Taking? Authorizing Provider  ?Accu-Chek Softclix Lancets lancets Use as instructed 3 times daily before meals 02/10/21   Charlott Rakes, MD  ?ADVAIR DISKUS 500-50 MCG/ACT AEPB Inhale 1 puff into the lungs daily. 04/08/21   [provider]  ?albuterol (VENTOLIN HFA) 108 (90 Base) MCG/ACT inhaler Inhale 2 puffs into the lungs every 4 (four) hours as needed for wheezing or shortness of breath. 11/23/21   Kozlow, Donnamarie Poag, MD  ?ALPHAGAN P 0.1 % SOLN Place 1 drop into the left eye 2 (two) times daily. 10/28/20   [provider]  ?atorvastatin (LIPITOR) 40 MG tablet Take 1 tablet (40 mg total) by mouth daily. 09/29/21   Charlott Rakes, MD  ?azelastine (ASTELIN) 0.1 % nasal spray Place 2 sprays into both nostrils 2 (two) times daily. Use in each nostril as directed 11/23/21   Kozlow, Donnamarie Poag, MD  ?Azelastine HCl 0.15 % SOLN Place 1 spray into both nostrils 2 (two) times daily. 05/18/21   Kozlow, Donnamarie Poag, MD  ?baclofen (LIORESAL) 10 MG tablet Take 10 mg by mouth 3 (three) times daily as needed for muscle pain. 11/28/19   [provider]  ?Blood Glucose Monitoring Suppl (ACCU-CHEK AVIVA PLUS) w/Device KIT Use 3 times daily before meals 02/10/21   Charlott Rakes, MD  ?carvedilol (COREG) 25 MG tablet TAKE 1 TABLET BY MOUTH TWICE A DAY 06/08/21   Camnitz, Ocie Doyne, MD  ?cetirizine (ZYRTEC) 10 MG tablet Take 1 tablet (10 mg total) by mouth 2 (two) times daily as needed for allergies (Can take an extra dose during flare ups.). 11/23/21   Kozlow, Donnamarie Poag, MD  ?CYMBALTA 30 MG capsule  Take 30 mg by mouth at bedtime. 04/04/19   [provider]  ?diltiazem (CARDIZEM CD) 180 MG 24 hr capsule TAKE 1 CAPSULE BY MOUTH EVERY DAY 04/28/21   Camnitz, Ocie Doyne, MD  ?dronedarone (MULTAQ) 400 MG tablet Take 1 tablet (400 mg total) by mouth 2 (two) times daily with a meal. 03/23/21   Fenton, Clint R, PA  ?ELIQUIS 5 MG TABS tablet TAKE 1 TABLET BY MOUTH TWICE A DAY 03/10/21   Camnitz, Will Hassell Done, MD  ?fluticasone (FLONASE) 50 MCG/ACT nasal spray Place 1 spray into both nostrils 2 (two) times daily. 11/23/21   Kozlow, Donnamarie Poag, MD  ?fluticasone furoate-vilanterol (BREO ELLIPTA) 200-25 MCG/ACT AEPB Inhale 1 puff into the lungs daily. 11/23/21   Kozlow, Donnamarie Poag, MD  ?folic acid (FOLVITE) 1 MG tablet Take 1 mg by mouth daily. 10/16/20   [provider]  ?furosemide (LASIX) 20 MG tablet Take 1 tablet daily, as needed, for swelling 02/09/21   Camnitz, Ocie Doyne, MD  ?GARLIC PO Take  1 tablet by mouth daily as needed (To build immune system up).    [provider]  ?glucose blood (ACCU-CHEK AVIVA PLUS) test strip Use as instructed 3 times daily before meals 02/10/21   Charlott Rakes, MD  ?hydrALAZINE (APRESOLINE) 100 MG tablet TAKE 1 TABLET (100 MG TOTAL) BY MOUTH 3 (THREE) TIMES DAILY. 03/20/20   Camnitz, Ocie Doyne, MD  ?methocarbamol (ROBAXIN) 500 MG tablet Take 1 tablet (500 mg total) by mouth every 8 (eight) hours as needed for muscle spasms. 12/29/20   Charlott Rakes, MD  ?methotrexate (RHEUMATREX) 2.5 MG tablet Take 2.5 mg by mouth daily. 11/11/20   [provider]  ?[place]. Devices MISC Back brace. Diagnosis chronic back pain 06/16/20   Charlott Rakes, MD  ?Misc. Devices MISC Left knee brace. Diagnosis L knee pain 06/16/20   Charlott Rakes, MD  ?montelukast (SINGULAIR) 10 MG tablet Take 1 tablet (10 mg total) by mouth at bedtime. 11/23/21   Kozlow, Donnamarie Poag, MD  ?naloxone [facility]) nasal spray 4 mg/0.1 mL Place 1 spray into the nose daily as needed for opioid reversal. 10/19/20   [provider]  ?Olopatadine HCl (PATADAY) 0.2 % SOLN Place 1 drop into both eyes daily as needed. 11/23/21   Kozlow, Donnamarie Poag, MD  ?omeprazole (PRILOSEC) 20 MG capsule TAKE 1 CAPSULE BY MOUTH EVERY DAY 3/15/

## 2021-11-25 NOTE — Telephone Encounter (Signed)
Pt agreeable to plan of care for tele pre op appt today at 4 pm. Med rec and consent are done. ?

## 2021-11-25 NOTE — Progress Notes (Signed)
CLEARANCE NOTES HAVE BEEN FAXED TO REQUESTING OFFICE  

## 2021-11-26 ENCOUNTER — Ambulatory Visit (INDEPENDENT_AMBULATORY_CARE_PROVIDER_SITE_OTHER): Payer: Medicaid Other | Admitting: *Deleted

## 2021-11-26 DIAGNOSIS — J309 Allergic rhinitis, unspecified: Secondary | ICD-10-CM | POA: Diagnosis not present

## 2021-11-26 DIAGNOSIS — I480 Paroxysmal atrial fibrillation: Secondary | ICD-10-CM | POA: Diagnosis not present

## 2021-11-27 DIAGNOSIS — I480 Paroxysmal atrial fibrillation: Secondary | ICD-10-CM | POA: Diagnosis not present

## 2021-11-28 DIAGNOSIS — I480 Paroxysmal atrial fibrillation: Secondary | ICD-10-CM | POA: Diagnosis not present

## 2021-11-29 DIAGNOSIS — I480 Paroxysmal atrial fibrillation: Secondary | ICD-10-CM | POA: Diagnosis not present

## 2021-12-01 DIAGNOSIS — I480 Paroxysmal atrial fibrillation: Secondary | ICD-10-CM | POA: Diagnosis not present

## 2021-12-02 DIAGNOSIS — I480 Paroxysmal atrial fibrillation: Secondary | ICD-10-CM | POA: Diagnosis not present

## 2021-12-03 DIAGNOSIS — I480 Paroxysmal atrial fibrillation: Secondary | ICD-10-CM | POA: Diagnosis not present

## 2021-12-04 DIAGNOSIS — I480 Paroxysmal atrial fibrillation: Secondary | ICD-10-CM | POA: Diagnosis not present

## 2021-12-05 DIAGNOSIS — I480 Paroxysmal atrial fibrillation: Secondary | ICD-10-CM | POA: Diagnosis not present

## 2021-12-06 DIAGNOSIS — I480 Paroxysmal atrial fibrillation: Secondary | ICD-10-CM | POA: Diagnosis not present

## 2021-12-07 DIAGNOSIS — I480 Paroxysmal atrial fibrillation: Secondary | ICD-10-CM | POA: Diagnosis not present

## 2021-12-08 ENCOUNTER — Ambulatory Visit (INDEPENDENT_AMBULATORY_CARE_PROVIDER_SITE_OTHER): Payer: Medicaid Other | Admitting: Physician Assistant

## 2021-12-08 ENCOUNTER — Ambulatory Visit (INDEPENDENT_AMBULATORY_CARE_PROVIDER_SITE_OTHER): Payer: Medicaid Other

## 2021-12-08 ENCOUNTER — Encounter: Payer: Self-pay | Admitting: Physician Assistant

## 2021-12-08 DIAGNOSIS — M170 Bilateral primary osteoarthritis of knee: Secondary | ICD-10-CM | POA: Diagnosis not present

## 2021-12-08 DIAGNOSIS — M1711 Unilateral primary osteoarthritis, right knee: Secondary | ICD-10-CM

## 2021-12-08 DIAGNOSIS — M1712 Unilateral primary osteoarthritis, left knee: Secondary | ICD-10-CM

## 2021-12-08 DIAGNOSIS — I480 Paroxysmal atrial fibrillation: Secondary | ICD-10-CM | POA: Diagnosis not present

## 2021-12-08 MED ORDER — LIDOCAINE HCL 1 % IJ SOLN
3.0000 mL | INTRAMUSCULAR | Status: AC | PRN
  Administered 2021-12-08: 3 mL

## 2021-12-08 MED ORDER — METHYLPREDNISOLONE ACETATE 40 MG/ML IJ SUSP
40.0000 mg | INTRAMUSCULAR | Status: AC | PRN
  Administered 2021-12-08: 40 mg via INTRA_ARTICULAR

## 2021-12-08 NOTE — Progress Notes (Signed)
   Procedure Note  [name]             Date of Birth: August 27, 1960           MRN: [id number]             Visit Date: 12/08/2021 HPI: Jason Stokes returns today due to bilateral knee pain.  Again he has known osteoarthritis both knees.  Said increased pain left knee for past 2 weeks describes it as sharp pain.  Feels both knees give way.  He has had some swelling in his right knee.  States the injection overall on 09/27/2021 helped.  Again denies being diabetic and states that he does not feel the last cortisone injection increased his glucose levels at all.  Review of systems: Negative for fevers or chills.  Physical exam: General well-developed well-nourished male no acute distress Diaphoretic.  Bilateral knees good range of motion both knees.  No abnormal warmth or erythema of either knee.'s positive effusion right knee only.  Right knee passive range of motion reveals patellofemoral crepitus.  Radiographs: Left knee 2 views: Shows moderate to severe narrowing medial joint line.  Moderate patellofemoral arthritic changes.  No acute fractures.  No interval changes from prior films 1 year ago. Procedures: Visit Diagnoses:  1. Primary osteoarthritis of left knee   2. Unilateral primary osteoarthritis, right knee     Large Joint Inj: bilateral knee on 12/08/2021 4:30 PM Indications: pain Details: 22 G 1.5 in needle, anterolateral approach  Arthrogram: No  Medications (Right): 3 mL lidocaine 1 % Aspirate (Right): 35 mL yellow Medications (Left): 3 mL lidocaine 1 %; 40 mg methylPREDNISolone acetate 40 MG/ML Outcome: tolerated well, no immediate complications Procedure, treatment alternatives, risks and benefits explained, specific risks discussed. Consent was given by the patient. Immediately prior to procedure a time out was called to verify the correct patient, procedure, equipment, support staff and site/side marked as required. Patient was prepped and draped in the usual  sterile fashion.    Plan: Right knee is wrapped with an Ace wrap he will remove this before retiring and bed this evening.  He understands to wait least 3 months between cortisone injections.  Questions were encouraged and answered at length.

## 2021-12-09 DIAGNOSIS — I480 Paroxysmal atrial fibrillation: Secondary | ICD-10-CM | POA: Diagnosis not present

## 2021-12-10 DIAGNOSIS — I480 Paroxysmal atrial fibrillation: Secondary | ICD-10-CM | POA: Diagnosis not present

## 2021-12-11 DIAGNOSIS — I480 Paroxysmal atrial fibrillation: Secondary | ICD-10-CM | POA: Diagnosis not present

## 2021-12-12 DIAGNOSIS — I480 Paroxysmal atrial fibrillation: Secondary | ICD-10-CM | POA: Diagnosis not present

## 2021-12-13 DIAGNOSIS — I480 Paroxysmal atrial fibrillation: Secondary | ICD-10-CM | POA: Diagnosis not present

## 2021-12-14 ENCOUNTER — Ambulatory Visit (INDEPENDENT_AMBULATORY_CARE_PROVIDER_SITE_OTHER): Payer: Medicaid Other

## 2021-12-14 DIAGNOSIS — J309 Allergic rhinitis, unspecified: Secondary | ICD-10-CM

## 2021-12-14 DIAGNOSIS — I480 Paroxysmal atrial fibrillation: Secondary | ICD-10-CM | POA: Diagnosis not present

## 2021-12-15 DIAGNOSIS — I480 Paroxysmal atrial fibrillation: Secondary | ICD-10-CM | POA: Diagnosis not present

## 2021-12-16 DIAGNOSIS — I480 Paroxysmal atrial fibrillation: Secondary | ICD-10-CM | POA: Diagnosis not present

## 2021-12-16 DIAGNOSIS — Z1211 Encounter for screening for malignant neoplasm of colon: Secondary | ICD-10-CM | POA: Diagnosis not present

## 2021-12-16 DIAGNOSIS — Z800 Family history of malignant neoplasm of digestive organs: Secondary | ICD-10-CM | POA: Diagnosis not present

## 2021-12-16 DIAGNOSIS — K635 Polyp of colon: Secondary | ICD-10-CM | POA: Diagnosis not present

## 2021-12-16 DIAGNOSIS — K648 Other hemorrhoids: Secondary | ICD-10-CM | POA: Diagnosis not present

## 2021-12-16 LAB — HM COLONOSCOPY

## 2021-12-17 DIAGNOSIS — I480 Paroxysmal atrial fibrillation: Secondary | ICD-10-CM | POA: Diagnosis not present

## 2021-12-18 DIAGNOSIS — I480 Paroxysmal atrial fibrillation: Secondary | ICD-10-CM | POA: Diagnosis not present

## 2021-12-19 DIAGNOSIS — I480 Paroxysmal atrial fibrillation: Secondary | ICD-10-CM | POA: Diagnosis not present

## 2021-12-20 DIAGNOSIS — I480 Paroxysmal atrial fibrillation: Secondary | ICD-10-CM | POA: Diagnosis not present

## 2021-12-20 NOTE — Progress Notes (Deleted)
Cardiology Office Note:    Date:  12/20/2021   ID:  Jason Stokes, DOB 10/17/1960, MRN [id number]  PCP:  Charlott Rakes, [place] Cardiologist: Will [name], MD   Reason for visit: 1 month follow-up ??  History of Present Illness:    Jason HODGMAN is a 61 y.o. male with a hx of paroxysmal atrial fibrillation s/p A-fib ablation 2017, hypertension, CVA.    He had a telehealth preop appointment with Coletta Memos on Nov 25, 2021 for colon cancer screening.  Patient was feeling well.  Today, ***  Paroxysmal atrial fibrillation -***  Hypertension -*** -Goal BP is <130/80.  Recommend DASH diet (high in vegetables, fruits, low-fat dairy products, whole grains, poultry, fish, and nuts and low in sweets, sugar-sweetened beverages, and red meats), salt restriction and increase physical activity.  Hyperlipidemia -*** -Discussed cholesterol lowering diets - Mediterranean diet, DASH diet, vegetarian diet, low-carbohydrate diet and avoidance of trans fats.  Discussed healthier choice substitutes.  Nuts, high-fiber foods, and fiber supplements may also improve lipids.    Obesity -Discussed how even a 5-10% weight loss can have cardiovascular benefits.   -Recommend moderate intensity activity for 30 minutes 5 days/week and the DASH diet.  Tobacco use  -Recommend tobacco cessation.  Reviewed physiologic effects of nicotine and the immediate-eventual benefits of quitting including improvement in cough/breathing and reduction in cardiovascular events.  Discussed quitting tips such as removing triggers and getting support from family/friends and Quitline [place]. -USPSTF recommends one-time screening for abdominal aortic aneurysm (AAA) by ultrasound in men 102 -87 years old who have ever smoked.      Disposition - Follow-up in ***     Past Medical History:  Diagnosis Date   Anxiety    Bilateral carpal tunnel syndrome 01/10/2018   Bipolar disorder ([place])    Chronic lower  back pain    Depression    Dysrhythmia    a-fib   GERD (gastroesophageal reflux disease)    History of alcohol abuse    History of nuclear stress test    Myoview 10/16: EF 50%, diaphragmatic attenuation, no ischemia, low risk   Hypertension    Migraine 2012-2014   Moderate persistent asthma with acute exacerbation 05/02/2018   PAF (paroxysmal atrial fibrillation) ([place]) 03/27/2015   a. Myoview neg for ischemia >> Flecainide started 10/16 >> FU ETT    Schizophrenia ([place])    Sciatica neuralgia    Small vessel disease ([place])    Right basal ganglia stroke   Stroke [facility]) "between 2012-2014"   residual "AF" (09/22/2015)    Past Surgical History:  Procedure Laterality Date   ATRIAL FIBRILLATION ABLATION  09/22/2015   CYST EXCISION  1996-97   surgery back of head    ELECTROPHYSIOLOGIC STUDY N/A 09/22/2015   Procedure: Atrial Fibrillation Ablation;  Surgeon: Will [name], MD;  Location: [place] [facility];  Service: Cardiovascular;  Laterality: N/A;   ELECTROPHYSIOLOGIC STUDY N/A 12/10/2015   Procedure: Atrial Fibrillation Ablation;  Surgeon: Will [name], MD;  Location: [place] [facility];  Service: Cardiovascular;  Laterality: N/A;   ELECTROPHYSIOLOGIC STUDY N/A 12/11/2015   Procedure: Cardioversion;  Surgeon: Will [name], MD;  Location: [place] [facility];  Service: Cardiovascular;  Laterality: N/A;   EMBOLIZATION Right 08/19/2020   Procedure: EMBOLIZATION;  Surgeon: Cherre Robins, MD;  Location: [place] [facility];  Service: Cardiovascular;  Laterality: Right;  hypogastric   EXCISION MASS HEAD N/A 01/06/2017   Procedure: EXCISION MASS FOREHEAD;  Surgeon: Irene Limbo, MD;  Location: [place];  Service: Plastics;  Laterality: N/A;   GANGLION CYST EXCISION Left    INTERCOSTAL NERVE BLOCK  2005   KNEE ARTHROSCOPY Right 2016   MASS EXCISION N/A 01/25/2021   Procedure: EXCISION SUBCUTANEOUS VS SUBFASCIAL MASS TORSO 3CM;  Surgeon: Irene Limbo, MD;   Location: [place];  Service: Plastics;  Laterality: N/A;    Current Medications: No outpatient medications have been marked as taking for the 12/21/21 encounter (Appointment) with Warren Lacy, PA-C.     Allergies:   Lisinopril   Social History   Socioeconomic History   Marital status: Single    Spouse name: [name]   Number of children: 3   Years of education: HS   Highest education level: Not on file  Occupational History    Comment: disabled  Tobacco Use   Smoking status: Every Day    Years: 29.00    Types: Cigarettes   Smokeless tobacco: Never   Tobacco comments:    1 cigarettes every day  06-02-2021  Vaping Use   Vaping Use: Never used  Substance and Sexual Activity   Alcohol use: Yes    Alcohol/week: 2.0 standard drinks    Types: 2 Standard drinks or equivalent per week    Comment: 63mxed drinks   Drug use: Yes    Types: Marijuana    Comment: smoked marijuana 01-02-17 for pain   Sexual activity: Not Currently  Other Topics Concern   Not on file  Social History Narrative   Patient lives at home with [place]    Patient has 3 children 2 step.    Patient has 13 years of schooling.    Patient is right handed.    Social Determinants of Health   Financial Resource Strain: Not on file  Food Insecurity: Not on file  Transportation Needs: Not on file  Physical Activity: Not on file  Stress: Not on file  Social Connections: Not on file     Family History: The patient's family history includes Heart disease in his father; Schizophrenia in his sister.  ROS:   Please see the history of present illness.     EKGs/Labs/Other Studies Reviewed:    EKG:  The ekg ordered today demonstrates ***  Recent Labs: 09/29/2021: ALT 17; BUN 15; Creatinine, Ser 1.19; Potassium 3.7; Sodium 140   Recent Lipid Panel Lab Results  Component Value Date/Time   CHOL 171 05/07/2018 10:04 AM   TRIG 204 (H) 05/07/2018 10:04 AM   HDL 33 (L) 05/07/2018 10:04 AM    LDLCALC 97 05/07/2018 10:04 AM    Physical Exam:    VS:  There were no vitals taken for this visit.   No data found.  Wt Readings from Last 3 Encounters:  11/23/21 252 lb 6.4 oz (114.5 kg)  06/02/21 264 lb 11.2 oz (120.1 kg)  05/18/21 266 lb (120.7 kg)     GEN: *** Well nourished, well developed in no acute distress HEENT: Normal NECK: No JVD; No carotid bruits CARDIAC: ***RRR, no murmurs, rubs, gallops RESPIRATORY:  Clear to auscultation without rales, wheezing or rhonchi  ABDOMEN: Soft, non-tender, non-distended MUSCULOSKELETAL: No edema; No deformity  SKIN: Warm and dry NEUROLOGIC:  Alert and oriented PSYCHIATRIC:  Normal affect     ASSESSMENT AND PLAN   ***   {Are you ordering a CV Procedure (e.g. stress test, cath, DCCV, TEE, etc)?   Press F2        :[id number]}  Medication Adjustments/Labs and Tests Ordered: Current medicines are reviewed at length with the patient today.  Concerns regarding medicines are outlined above.  No orders of the defined types were placed in this encounter.  No orders of the defined types were placed in this encounter.   There are no Patient Instructions on file for this visit.   Signed, Warren Lacy, PA-C  12/20/2021 9:31 PM    [facility]

## 2021-12-21 ENCOUNTER — Ambulatory Visit: Payer: Medicaid Other | Admitting: Physician Assistant

## 2021-12-21 DIAGNOSIS — I480 Paroxysmal atrial fibrillation: Secondary | ICD-10-CM | POA: Diagnosis not present

## 2021-12-21 DIAGNOSIS — I10 Essential (primary) hypertension: Secondary | ICD-10-CM

## 2021-12-22 ENCOUNTER — Ambulatory Visit: Payer: Medicaid Other | Admitting: Physician Assistant

## 2021-12-22 ENCOUNTER — Telehealth: Payer: Self-pay | Admitting: Cardiology

## 2021-12-22 ENCOUNTER — Ambulatory Visit (INDEPENDENT_AMBULATORY_CARE_PROVIDER_SITE_OTHER): Payer: Medicaid Other

## 2021-12-22 DIAGNOSIS — J309 Allergic rhinitis, unspecified: Secondary | ICD-10-CM | POA: Diagnosis not present

## 2021-12-22 DIAGNOSIS — I480 Paroxysmal atrial fibrillation: Secondary | ICD-10-CM | POA: Diagnosis not present

## 2021-12-22 NOTE — Telephone Encounter (Signed)
Patient was calling in to see why appt was made on 6/6 and 6/7. please advise

## 2021-12-22 NOTE — Telephone Encounter (Signed)
Spoke with pt re message after reviewing chart and appointments not sure why those were made Pt is also requesting an appointment with Dr Curt Bears Attempted to schedule but did not see anything until August and pt states he needs to be seen sooner than that .Adonis Housekeeper

## 2021-12-23 DIAGNOSIS — I480 Paroxysmal atrial fibrillation: Secondary | ICD-10-CM | POA: Diagnosis not present

## 2021-12-24 ENCOUNTER — Ambulatory Visit (INDEPENDENT_AMBULATORY_CARE_PROVIDER_SITE_OTHER): Payer: Medicaid Other | Admitting: Cardiology

## 2021-12-24 ENCOUNTER — Encounter: Payer: Self-pay | Admitting: Cardiology

## 2021-12-24 ENCOUNTER — Encounter: Payer: Self-pay | Admitting: *Deleted

## 2021-12-24 VITALS — BP 122/80 | HR 119 | Ht 73.0 in | Wt 254.0 lb

## 2021-12-24 DIAGNOSIS — I480 Paroxysmal atrial fibrillation: Secondary | ICD-10-CM

## 2021-12-24 DIAGNOSIS — I4819 Other persistent atrial fibrillation: Secondary | ICD-10-CM

## 2021-12-24 DIAGNOSIS — D6869 Other thrombophilia: Secondary | ICD-10-CM

## 2021-12-24 DIAGNOSIS — Z01812 Encounter for preprocedural laboratory examination: Secondary | ICD-10-CM | POA: Diagnosis not present

## 2021-12-24 MED ORDER — AMIODARONE HCL 200 MG PO TABS: 200.0000 mg | ORAL_TABLET | Freq: Every day | ORAL | 1 refills | Status: DC

## 2021-12-24 MED ORDER — AMIODARONE HCL 200 MG PO TABS: ORAL_TABLET | ORAL | 0 refills | Status: DC

## 2021-12-24 NOTE — Progress Notes (Signed)
Electrophysiology Office Note   Date:  12/24/2021   ID:  Jason Stokes, DOB 03-Jul-1961, MRN [id number]  PCP:  Charlott Rakes, MD  Cardiologist:  [name] Primary Electrophysiologist: Kerah Hardebeck [name], MD    No chief complaint on file.     History of Present Illness: Jason Stokes is a 61 y.o. male who presents today for electrophysiology evaluation.     He has a history significant for atrial fibrillation, hypertension, remote CVA.  He is post atrial fibrillation ablation 12/10/2015.  He is currently on Multaq.  He has had no episodes of atrial fibrillation since his ablation.  Today, denies symptoms of palpitations, chest pain, shortness of breath, orthopnea, PND, lower extremity edema, claudication, dizziness, presyncope, syncope, bleeding, or neurologic sequela. The patient is tolerating medications without difficulties.  Since being seen he has done well.  He has no chest pain or shortness of breath.  Unfortunately he does have weakness and fatigue.  ECG today shows atrial fibrillation.  He states that he has been in and out of atrial fibrillation quite a bit over the last few weeks.  He would like a more effective rhythm control strategy.   Past Medical History:  Diagnosis Date   Anxiety    Bilateral carpal tunnel syndrome 01/10/2018   Bipolar disorder (HCC)    Chronic lower back pain    Depression    Dysrhythmia    a-fib   GERD (gastroesophageal reflux disease)    History of alcohol abuse    History of nuclear stress test    Myoview 10/16: EF 50%, diaphragmatic attenuation, no ischemia, low risk   Hypertension    Migraine 2012-2014   Moderate persistent asthma with acute exacerbation 05/02/2018   PAF (paroxysmal atrial fibrillation) ([place]) 03/27/2015   a. Myoview neg for ischemia >> Flecainide started 10/16 >> FU ETT    Schizophrenia ([place])    Sciatica neuralgia    Small vessel disease ([place])    Right basal ganglia stroke   Stroke [facility]) "between 2012-2014"    residual "AF" (09/22/2015)   Past Surgical History:  Procedure Laterality Date   ATRIAL FIBRILLATION ABLATION  09/22/2015   CYST EXCISION  1996-97   surgery back of head    ELECTROPHYSIOLOGIC STUDY N/A 09/22/2015   Procedure: Atrial Fibrillation Ablation;  Surgeon: Austynn Pridmore [name], MD;  Location: [place] [facility];  Service: Cardiovascular;  Laterality: N/A;   ELECTROPHYSIOLOGIC STUDY N/A 12/10/2015   Procedure: Atrial Fibrillation Ablation;  Surgeon: Camillia Marcy [name], MD;  Location: [place] [facility];  Service: Cardiovascular;  Laterality: N/A;   ELECTROPHYSIOLOGIC STUDY N/A 12/11/2015   Procedure: Cardioversion;  Surgeon: Janeal Abadi [name], MD;  Location: [place] [facility];  Service: Cardiovascular;  Laterality: N/A;   EMBOLIZATION Right 08/19/2020   Procedure: EMBOLIZATION;  Surgeon: Cherre Robins, MD;  Location: [place] [facility];  Service: Cardiovascular;  Laterality: Right;  hypogastric   EXCISION MASS HEAD N/A 01/06/2017   Procedure: EXCISION MASS FOREHEAD;  Surgeon: Irene Limbo, MD;  Location: [facility];  Service: Plastics;  Laterality: N/A;   GANGLION CYST EXCISION Left    INTERCOSTAL NERVE BLOCK  2005   KNEE ARTHROSCOPY Right 2016   MASS EXCISION N/A 01/25/2021   Procedure: EXCISION SUBCUTANEOUS VS SUBFASCIAL MASS TORSO 3CM;  Surgeon: Irene Limbo, MD;  Location: [place];  Service: Plastics;  Laterality: N/A;     Current Outpatient Medications  Medication Sig Dispense Refill   ADVAIR DISKUS 500-50  MCG/ACT AEPB Inhale 1 puff into the lungs daily.     albuterol (VENTOLIN HFA) 108 (90 Base) MCG/ACT inhaler Inhale 2 puffs into the lungs every 4 (four) hours as needed for wheezing or shortness of breath. 54 g 1   ALPHAGAN P 0.1 % SOLN Place 1 drop into the left eye 2 (two) times daily.     amiodarone (PACERONE) 200 MG tablet Take 2 tablets (400 mg total) TWICE a day for 2 weeks, then take 1 tablet (200 mg total) TWICE a day for 2  weeks, then, take 1 tablet (200 mg total) ONCE a day 84 tablet 0   amiodarone (PACERONE) 200 MG tablet Take 1 tablet (200 mg total) by mouth daily. 90 tablet 1   atorvastatin (LIPITOR) 40 MG tablet Take 1 tablet (40 mg total) by mouth daily. 90 tablet 1   azelastine (ASTELIN) 0.1 % nasal spray Place 2 sprays into both nostrils 2 (two) times daily. Use in each nostril as directed 30 mL 5   Azelastine HCl 0.15 % SOLN Place 1 spray into both nostrils 2 (two) times daily. 30 mL 5   baclofen (LIORESAL) 10 MG tablet Take 10 mg by mouth 3 (three) times daily as needed for muscle pain.     carvedilol (COREG) 25 MG tablet TAKE 1 TABLET BY MOUTH TWICE A DAY 180 tablet 2   cetirizine (ZYRTEC) 10 MG tablet Take 1 tablet (10 mg total) by mouth 2 (two) times daily as needed for allergies (Can take an extra dose during flare ups.). 60 tablet 5   CYMBALTA 30 MG capsule Take 30 mg by mouth at bedtime.     diltiazem (CARDIZEM CD) 180 MG 24 hr capsule TAKE 1 CAPSULE BY MOUTH EVERY DAY 90 capsule 1   ELIQUIS 5 MG TABS tablet TAKE 1 TABLET BY MOUTH TWICE A DAY 60 tablet 6   fluticasone (FLONASE) 50 MCG/ACT nasal spray Place 1 spray into both nostrils 2 (two) times daily. 16 g 5   fluticasone furoate-vilanterol (BREO ELLIPTA) 200-25 MCG/ACT AEPB Inhale 1 puff into the lungs daily. 28 each 5   folic acid (FOLVITE) 1 MG tablet Take 1 mg by mouth daily.     furosemide (LASIX) 20 MG tablet Take 1 tablet daily, as needed, for swelling 30 tablet 1   GARLIC PO Take 1 tablet by mouth daily as needed (To build immune system up).     hydrALAZINE (APRESOLINE) 100 MG tablet TAKE 1 TABLET (100 MG TOTAL) BY MOUTH 3 (THREE) TIMES DAILY. 270 tablet 2   methocarbamol (ROBAXIN) 500 MG tablet Take 1 tablet (500 mg total) by mouth every 8 (eight) hours as needed for muscle spasms. 30 tablet 1   methotrexate (RHEUMATREX) 2.5 MG tablet Take 2.5 mg by mouth daily.     Misc. Devices MISC Back brace. Diagnosis chronic back pain 1 each 0    Misc. Devices MISC Left knee brace. Diagnosis L knee pain 1 each 0   montelukast (SINGULAIR) 10 MG tablet Take 1 tablet (10 mg total) by mouth at bedtime. 30 tablet 5   naloxone (NARCAN) nasal spray 4 mg/0.1 mL Place 1 spray into the nose daily as needed for opioid reversal.     Olopatadine HCl (PATADAY) 0.2 % SOLN Place 1 drop into both eyes daily as needed. 2.5 mL 5   omeprazole (PRILOSEC) 20 MG capsule TAKE 1 CAPSULE BY MOUTH EVERY DAY 90 capsule 1   prazosin (MINIPRESS) 1 MG capsule Take 1 mg by mouth  at bedtime.  2   prednisoLONE acetate (PRED FORTE) 1 % ophthalmic suspension Place 1 drop into the left eye 6 (six) times daily.     predniSONE (DELTASONE) 20 MG tablet Take 1 tablet (20 mg total) by mouth daily with breakfast. 5 tablet 0   pregabalin (LYRICA) 75 MG capsule Take 1 capsule by mouth 4 (four) times daily as needed (pain).     SIMBRINZA 1-0.2 % SUSP Place 1 drop into both eyes daily as needed for allergies.     Vitamin D3 (VITAMIN D) 25 MCG tablet Take 1,000 Units by mouth daily.     No current facility-administered medications for this visit.    Allergies:   Lisinopril   Social History:  The patient  reports that he has been smoking cigarettes. He has never used smokeless tobacco. He reports current alcohol use of about 2.0 standard drinks of alcohol per week. He reports current drug use. Drug: Marijuana.   Family History:  The patient's family history includes Heart disease in his father; Schizophrenia in his sister.   ROS:  Please see the history of present illness.   Otherwise, review of systems is positive for none.   All other systems are reviewed and negative.   PHYSICAL EXAM: VS:  BP 122/80   Pulse (!) 119   Ht '6\' 1"'$  (1.854 m)   Wt 254 lb (115.2 kg)   SpO2 97%   BMI 33.51 kg/m  , BMI Body mass index is 33.51 kg/m. GEN: Well nourished, well developed, in no acute distress  HEENT: normal  Neck: no JVD, carotid bruits, or masses Cardiac: Irregular, tachycardic;  no murmurs, rubs, or gallops,no edema  Respiratory:  clear to auscultation bilaterally, normal work of breathing GI: soft, nontender, nondistended, + BS MS: no deformity or atrophy  Skin: warm and dry Neuro:  Strength and sensation are intact Psych: euthymic mood, full affect  EKG:  EKG is ordered today. Personal review of the ekg ordered shows atrial fibrillation, rate 119  Recent Labs: 09/29/2021: ALT 17; BUN 15; Creatinine, Ser 1.19; Potassium 3.7; Sodium 140    Lipid Panel     Component Value Date/Time   CHOL 171 05/07/2018 1004   TRIG 204 (H) 05/07/2018 1004   HDL 33 (L) 05/07/2018 1004   CHOLHDL 5.2 (H) 05/07/2018 1004   CHOLHDL 5.4 10/17/2013 1042   VLDL 36 10/17/2013 1042   LDLCALC 97 05/07/2018 1004     Wt Readings from Last 3 Encounters:  12/24/21 254 lb (115.2 kg)  11/23/21 252 lb 6.4 oz (114.5 kg)  06/02/21 264 lb 11.2 oz (120.1 kg)     Other studies Reviewed: Additional studies/ records that were reviewed today include:  SPECT 05/15/15, TTE 03/31/15 Review of the above records today demonstrates:  SPECT The left ventricular ejection fraction is mildly decreased (45-54%). Nuclear stress EF: 50%. There was no ST segment deviation noted during stress. Defect 1: Moderate sized, mild in intensity, fixed defect in the mid and basal inferior and basal inferolateral wall consistent with diaphragmatic attenuation. No ischemia noted. This is a low risk study.  TTE - Normal LV systolic function; grade 1 diastolic dysfunction; mild LAE.  ETT 01/26/17 - personally reviewed Blood pressure demonstrated a normal response to exercise. There was no ST segment deviation noted during stress. Negative study for exercise induced ischemia. There were frequent PVCs, ventricular couplets and ventricular salvos.   ASSESSMENT AND PLAN:  1.  Paroxysmal atrial fibrillation: Currently on Eliquis and Multaq.  CHA2DS2-VASc  of 3.  High risk medication monitoring.  Post ablation  12/10/2015.  He is unfortunately back in atrial fibrillation.  He prefer to avoid long-term medication management.  We Marlisha Vanwyk stop his Multaq today and start him on amiodarone and plan for cardioversion.  For A-fib management, we Kyrollos Cordell also plan for atrial fibrillation ablation.  Risk, benefits, and alternatives to EP study and radiofrequency ablation for afib were also discussed in detail today. These risks include but are not limited to stroke, bleeding, vascular damage, tamponade, perforation, damage to the esophagus, lungs, and other structures, pulmonary vein stenosis, worsening renal function, and death. The patient understands these risk and wishes to proceed.  We Lota Leamer therefore proceed with catheter ablation at the next available time.  Carto, ICE, anesthesia are requested for the procedure.  Clennon Nasca also obtain CT PV protocol prior to the procedure to exclude LAA thrombus and further evaluate atrial anatomy.   2.  Hypertension: Currently well controlled  3.  Lower extremity edema: Minimal  4.  Secondary hypercoagulable state: Currently on Eliquis for atrial fibrillation as above   Current medicines are reviewed at length with the patient today.   The patient does not have concerns regarding his medicines.  The following changes were made today: Start amiodarone  Labs/ tests ordered today include:  Orders Placed This Encounter  Procedures   CT CARDIAC MORPH/PULM VEIN W/CM&W/O CA SCORE   Basic metabolic panel   CBC   EKG 12-Lead      Disposition:   FU 3 months.  Signed, Becca Bayne [name], MD  12/24/2021 3:53 PM     [facility] [address] [place] [address] [phone] (office) [phone] (fax)

## 2021-12-24 NOTE — Patient Instructions (Addendum)
Medication Instructions:  Your physician has recommended you make the following change in your medication:  STOP Multaq 2.  START Amiodarone  - take 2 tablets (400 mg total) TWICE a day for 2 weeks, then  - take 1 tablet (200 mg total) TWICE a day for 2 weeks, then  - take 1 tablet (200 mg total) ONCE a day   *If you need a refill on your cardiac medications before your next appointment, please call your pharmacy*   Lab Work: Pre procedure labs -- see ablation procedure instruction letter:  BMP & CBC  If you have labs (blood work) drawn today and your tests are completely normal, you will receive your results only by: [place] (if you have MyChart) OR A paper copy in the mail If you have any lab test that is abnormal or we need to change your treatment, we will call you to review the results.   Testing/Procedures: Your physician has recommended that you have a Cardioversion (DCCV). Electrical Cardioversion uses a jolt of electricity to your heart either through paddles or wired patches attached to your chest. This is a controlled, usually prescheduled, procedure. Defibrillation is done under light anesthesia in the hospital, and you usually go home the day of the procedure. This is done to get your heart back into a normal rhythm. You are not awake for the procedure. Please see the instruction sheet given to you today.  Your physician has requested that you have cardiac CT within 7 days PRIOR to your ablation. Cardiac computed tomography (CT) is a painless test that uses an x-ray machine to take clear, detailed pictures of your heart.  Please follow instruction below located under "other instructions". You will get a call from our office to schedule the date for this test.  Your physician has recommended that you have an ablation. Catheter ablation is a medical procedure used to treat some cardiac arrhythmias (irregular heartbeats). During catheter ablation, a long, thin, flexible  tube is put into a blood vessel in your groin (upper thigh), or neck. This tube is called an ablation catheter. It is then guided to your heart through the blood vessel. Radio frequency waves destroy small areas of heart tissue where abnormal heartbeats may cause an arrhythmia to start. Please follow instruction letter given to you today.   Follow-Up: At [facility], you and your health needs are our priority.  As part of our continuing mission to provide you with exceptional heart care, we have created designated Provider Care Teams.  These Care Teams include your primary Cardiologist (physician) and Advanced Practice Providers (APPs -  Physician Assistants and Nurse Practitioners) who all work together to provide you with the care you need, when you need it.  Your next appointment:   1 month(s) after your ablation  The format for your next appointment:   In Person  Provider:   [facility]   Thank you for choosing [facility]!!   Trinidad Curet, RN [phone]    Other Instructions  Amiodarone Tablets What is this medication? AMIODARONE (a MEE oh da rone) prevents and treats a fast or irregular heartbeat (arrhythmia). It works by slowing down overactive electric signals in the heart, which stabilizes your heart rhythm. It belongs to a group of medications called antiarrhythmics. This medicine may be used for other purposes; ask your health care provider or pharmacist if you have questions. COMMON BRAND NAME(S): Cordarone, Pacerone What should I tell my care team before I take this medication?  They need to know if you have any of these conditions: Liver disease Lung disease Other heart problems Thyroid disease An unusual or allergic reaction to amiodarone, iodine, other medications, foods, dyes, or preservatives Pregnant or trying to get pregnant Breast-feeding How should I use this medication? Take this medication by mouth with a glass of water. Follow the directions  on the prescription label. You can take this medication with or without food. However, you should always take it the same way each time. Take your doses at regular intervals. Do not take your medication more often than directed. Do not stop taking except on the advice of your care team. A special MedGuide will be given to you by the pharmacist with each prescription and refill. Be sure to read this information carefully each time. Talk to your care team regarding the use of this medication in children. Special care may be needed. Overdosage: If you think you have taken too much of this medicine contact a poison control center or emergency room at once. NOTE: This medicine is only for you. Do not share this medicine with others. What if I miss a dose? If you miss a dose, take it as soon as you can. If it is almost time for your next dose, take only that dose. Do not take double or extra doses. What may interact with this medication? Do not take this medication with any of the following: Abarelix Apomorphine Arsenic trioxide Certain antibiotics like erythromycin, gemifloxacin, levofloxacin, pentamidine Certain medications for depression like amoxapine, tricyclic antidepressants Certain medications for fungal infections like fluconazole, itraconazole, ketoconazole, posaconazole, voriconazole Certain medications for irregular heartbeat like disopyramide, dronedarone, ibutilide, propafenone, sotalol Certain medications for malaria like chloroquine, halofantrine Cisapride Droperidol Haloperidol [place] Maprotiline Methadone Phenothiazines like chlorpromazine, mesoridazine, thioridazine Pimozide Ranolazine Red yeast rice Vardenafil This medication may also interact with the following: Antiviral medications for HIV or AIDS Certain medications for blood pressure, heart disease, irregular heart beat Certain medications for cholesterol like atorvastatin, cerivastatin, lovastatin,  simvastatin Certain medications for hepatitis C like sofosbuvir and ledipasvir; sofosbuvir Certain medications for seizures like phenytoin Certain medications for thyroid problems Certain medications that treat or prevent blood clots like warfarin Cholestyramine Cimetidine Clopidogrel Cyclosporine Dextromethorphan Diuretics Dofetilide Fentanyl General anesthetics Grapefruit juice Lidocaine Loratadine Methotrexate Other medications that prolong the QT interval (cause an abnormal heart rhythm) Procainamide Quinidine Rifabutin, rifampin, or rifapentine St. John's Wort Trazodone Ziprasidone This list may not describe all possible interactions. Give your health care provider a list of all the medicines, herbs, non-prescription drugs, or dietary supplements you use. Also tell them if you smoke, drink alcohol, or use illegal drugs. Some items may interact with your medicine. What should I watch for while using this medication? Your condition will be monitored closely when you first begin therapy. Often, this medication is first started in a hospital or other monitored health care setting. Once you are on maintenance therapy, visit your care team for regular checks on your progress. Because your condition and use of this medication carry some risk, it is a good idea to carry an identification card, necklace or bracelet with details of your condition, medications, and care team. You may get drowsy or dizzy. Do not drive, use machinery, or do anything that needs mental alertness until you know how this medication affects you. Do not stand or sit up quickly, especially if you are an older patient. This reduces the risk of dizzy or fainting spells. This medication can make you more sensitive to  the sun. Keep out of the sun. If you cannot avoid being in the sun, wear protective clothing and use sunscreen. Do not use sun lamps or tanning beds/booths. You should have regular eye exams before and  during treatment. Call your care team if you have blurred vision, see halos, or your eyes become sensitive to light. Your eyes may get dry. It may be helpful to use a lubricating eye solution or artificial tears solution. If you are going to have surgery or a procedure that requires contrast dyes, tell your care team that you are taking this medication. What side effects may I notice from receiving this medication? Side effects that you should report to your care team as soon as possible: Allergic reactions--skin rash, itching, hives, swelling of the face, lips, tongue, or throat Bluish-gray skin Change in vision such as blurry vision, seeing halos around lights, vision loss Heart failure--shortness of breath, swelling of the ankles, feet, or hands, sudden weight gain, unusual weakness or fatigue Heart rhythm changes--fast or irregular heartbeat, dizziness, feeling faint or lightheaded, chest pain, trouble breathing High thyroid levels (hyperthyroidism)--fast or irregular heartbeat, weight loss, excessive sweating or sensitivity to heat, tremors or shaking, anxiety, nervousness, irregular menstrual cycle or spotting Liver injury--right upper belly pain, loss of appetite, nausea, light-colored stool, dark yellow or brown urine, yellowing skin or eyes, unusual weakness or fatigue Low thyroid levels (hypothyroidism)--unusual weakness or fatigue, sensitivity to cold, constipation, hair loss, dry skin, weight gain, feelings of depression Lung injury--shortness of breath or trouble breathing, cough, spitting up blood, chest pain, fever Pain, tingling, or numbness in the hands or feet, muscle weakness, trouble walking, loss of balance or coordination Side effects that usually do not require medical attention (report to your care team if they continue or are bothersome): Nausea Vomiting This list may not describe all possible side effects. Call your doctor for medical advice about side effects. You may  report side effects to FDA at 1-800-FDA-1088. Where should I keep my medication? Keep out of the reach of children and pets. Store at room temperature between 20 and 25 degrees C (68 and 77 degrees F). Protect from light. Keep container tightly closed. Throw away any unused medication after the expiration date. NOTE: This sheet is a summary. It may not cover all possible information. If you have questions about this medicine, talk to your doctor, pharmacist, or health care provider.  2023 Elsevier/Gold Standard (2020-08-28 00:00:00)     Cardiac Ablation Cardiac ablation is a procedure to destroy (ablate) some heart tissue that is sending bad signals. These bad signals cause problems in heart rhythm. The heart has many areas that make these signals. If there are problems in these areas, they can make the heart beat in a way that is not normal. Destroying some tissues can help make the heart rhythm normal. Tell your doctor about: Any allergies you have. All medicines you are taking. These include vitamins, herbs, eye drops, creams, and over-the-counter medicines. Any problems you or family members have had with medicines that make you fall asleep (anesthetics). Any blood disorders you have. Any surgeries you have had. Any medical conditions you have, such as kidney failure. Whether you are pregnant or may be pregnant. What are the risks? This is a safe procedure. But problems may occur, including: Infection. Bruising and bleeding. Bleeding into the chest. Stroke or blood clots. Damage to nearby areas of your body. Allergies to medicines or dyes. The need for a pacemaker if the normal system  is damaged. Failure of the procedure to treat the problem. What happens before the procedure? Medicines Ask your doctor about: Changing or stopping your normal medicines. This is important. Taking aspirin and ibuprofen. Do not take these medicines unless your doctor tells you to take  them. Taking other medicines, vitamins, herbs, and supplements. General instructions Follow instructions from your doctor about what you cannot eat or drink. Plan to have someone take you home from the hospital or clinic. If you will be going home right after the procedure, plan to have someone with you for 24 hours. Ask your doctor what steps will be taken to prevent infection. What happens during the procedure?  An IV tube will be put into one of your veins. You will be given a medicine to help you relax. The skin on your neck or groin will be numbed. A cut (incision) will be made in your neck or groin. A needle will be put through your cut and into a large vein. A tube (catheter) will be put into the needle. The tube will be moved to your heart. Dye may be put through the tube. This helps your doctor see your heart. Small devices (electrodes) on the tube will send out signals. A type of energy will be used to destroy some heart tissue. The tube will be taken out. Pressure will be held on your cut. This helps stop bleeding. A bandage will be put over your cut. The exact procedure may vary among doctors and hospitals. What happens after the procedure? You will be watched until you leave the hospital or clinic. This includes checking your heart rate, breathing rate, oxygen, and blood pressure. Your cut will be watched for bleeding. You will need to lie still for a few hours. Do not drive for 24 hours or as long as your doctor tells you. Summary Cardiac ablation is a procedure to destroy some heart tissue. This is done to treat heart rhythm problems. Tell your doctor about any medical conditions you may have. Tell him or her about all medicines you are taking to treat them. This is a safe procedure. But problems may occur. These include infection, bruising, bleeding, and damage to nearby areas of your body. Follow what your doctor tells you about food and drink. You may also be told to  change or stop some of your medicines. After the procedure, do not drive for 24 hours or as long as your doctor tells you. This information is not intended to replace advice given to you by your health care provider. Make sure you discuss any questions you have with your health care provider. Document Revised: 06/06/2019 Document Reviewed: 06/06/2019 Elsevier Patient Education  [place].

## 2021-12-25 DIAGNOSIS — I480 Paroxysmal atrial fibrillation: Secondary | ICD-10-CM | POA: Diagnosis not present

## 2021-12-27 DIAGNOSIS — I480 Paroxysmal atrial fibrillation: Secondary | ICD-10-CM | POA: Diagnosis not present

## 2021-12-28 DIAGNOSIS — H0510 Unspecified chronic inflammatory disorders of orbit: Secondary | ICD-10-CM | POA: Diagnosis not present

## 2021-12-28 DIAGNOSIS — I480 Paroxysmal atrial fibrillation: Secondary | ICD-10-CM | POA: Diagnosis not present

## 2021-12-28 DIAGNOSIS — H3023 Posterior cyclitis, bilateral: Secondary | ICD-10-CM | POA: Diagnosis not present

## 2021-12-29 ENCOUNTER — Encounter: Payer: Self-pay | Admitting: Physician Assistant

## 2021-12-29 DIAGNOSIS — I480 Paroxysmal atrial fibrillation: Secondary | ICD-10-CM | POA: Diagnosis not present

## 2021-12-30 DIAGNOSIS — I480 Paroxysmal atrial fibrillation: Secondary | ICD-10-CM | POA: Diagnosis not present

## 2021-12-31 DIAGNOSIS — I480 Paroxysmal atrial fibrillation: Secondary | ICD-10-CM | POA: Diagnosis not present

## 2022-01-01 DIAGNOSIS — I480 Paroxysmal atrial fibrillation: Secondary | ICD-10-CM | POA: Diagnosis not present

## 2022-01-02 DIAGNOSIS — I480 Paroxysmal atrial fibrillation: Secondary | ICD-10-CM | POA: Diagnosis not present

## 2022-01-04 ENCOUNTER — Encounter (HOSPITAL_COMMUNITY)
Admission: RE | Disposition: A | Discharge status: Home / Self Care | Payer: Self-pay | Source: Home / Self Care | Attending: Cardiovascular Disease

## 2022-01-04 ENCOUNTER — Encounter (HOSPITAL_COMMUNITY): Payer: Self-pay | Admitting: Cardiovascular Disease

## 2022-01-04 ENCOUNTER — Ambulatory Visit (HOSPITAL_COMMUNITY)
Admission: RE | Admit: 2022-01-04 | Discharge: 2022-01-04 | Disposition: A | Discharge status: Home / Self Care | Payer: Medicaid Other | Attending: Cardiovascular Disease | Admitting: Cardiovascular Disease

## 2022-01-04 ENCOUNTER — Other Ambulatory Visit: Payer: Self-pay

## 2022-01-04 DIAGNOSIS — Z8673 Personal history of transient ischemic attack (TIA), and cerebral infarction without residual deficits: Secondary | ICD-10-CM | POA: Insufficient documentation

## 2022-01-04 DIAGNOSIS — I10 Essential (primary) hypertension: Secondary | ICD-10-CM | POA: Insufficient documentation

## 2022-01-04 DIAGNOSIS — Z538 Procedure and treatment not carried out for other reasons: Secondary | ICD-10-CM | POA: Insufficient documentation

## 2022-01-04 DIAGNOSIS — Z79899 Other long term (current) drug therapy: Secondary | ICD-10-CM | POA: Diagnosis not present

## 2022-01-04 DIAGNOSIS — I480 Paroxysmal atrial fibrillation: Secondary | ICD-10-CM | POA: Diagnosis not present

## 2022-01-04 DIAGNOSIS — D6869 Other thrombophilia: Secondary | ICD-10-CM | POA: Diagnosis not present

## 2022-01-04 DIAGNOSIS — Z7901 Long term (current) use of anticoagulants: Secondary | ICD-10-CM | POA: Insufficient documentation

## 2022-01-04 DIAGNOSIS — R600 Localized edema: Secondary | ICD-10-CM | POA: Insufficient documentation

## 2022-01-04 SURGERY — CANCELLED PROCEDURE

## 2022-01-04 MED ORDER — SODIUM CHLORIDE 0.9 % IV SOLN: INTRAVENOUS | Status: DC

## 2022-01-04 NOTE — Progress Notes (Signed)
Patient in NSR. Procedure cancelled. EKG to confirm

## 2022-01-05 DIAGNOSIS — I480 Paroxysmal atrial fibrillation: Secondary | ICD-10-CM | POA: Diagnosis not present

## 2022-01-06 ENCOUNTER — Ambulatory Visit (INDEPENDENT_AMBULATORY_CARE_PROVIDER_SITE_OTHER): Payer: Medicaid Other

## 2022-01-06 DIAGNOSIS — J309 Allergic rhinitis, unspecified: Secondary | ICD-10-CM

## 2022-01-06 DIAGNOSIS — I480 Paroxysmal atrial fibrillation: Secondary | ICD-10-CM | POA: Diagnosis not present

## 2022-01-06 NOTE — Interval H&P Note (Signed)
History and Physical Interval Note:  01/06/2022 8:52 PM  Jason Stokes  has presented today for surgery, with the diagnosis of atrial fibrillation.  The various methods of treatment have been discussed with the patient and family. After consideration of risks, benefits and other options for treatment, the patient has consented to  Procedure(s): CANCELLED PROCEDURE as a surgical intervention.  The patient's history has been reviewed, patient examined, no change in status, stable for surgery.  I have reviewed the patient's chart and labs.  Questions were answered to the patient's satisfaction.     Mertie Moores

## 2022-01-07 DIAGNOSIS — I480 Paroxysmal atrial fibrillation: Secondary | ICD-10-CM | POA: Diagnosis not present

## 2022-01-08 DIAGNOSIS — I480 Paroxysmal atrial fibrillation: Secondary | ICD-10-CM | POA: Diagnosis not present

## 2022-01-09 DIAGNOSIS — I480 Paroxysmal atrial fibrillation: Secondary | ICD-10-CM | POA: Diagnosis not present

## 2022-01-10 DIAGNOSIS — I480 Paroxysmal atrial fibrillation: Secondary | ICD-10-CM | POA: Diagnosis not present

## 2022-01-11 ENCOUNTER — Ambulatory Visit (INDEPENDENT_AMBULATORY_CARE_PROVIDER_SITE_OTHER): Payer: Medicaid Other

## 2022-01-11 DIAGNOSIS — I480 Paroxysmal atrial fibrillation: Secondary | ICD-10-CM | POA: Diagnosis not present

## 2022-01-11 DIAGNOSIS — J309 Allergic rhinitis, unspecified: Secondary | ICD-10-CM | POA: Diagnosis not present

## 2022-01-11 NOTE — Progress Notes (Signed)
VIALS EXP 01-12-23

## 2022-01-12 DIAGNOSIS — I480 Paroxysmal atrial fibrillation: Secondary | ICD-10-CM | POA: Diagnosis not present

## 2022-01-12 DIAGNOSIS — J3081 Allergic rhinitis due to animal (cat) (dog) hair and dander: Secondary | ICD-10-CM

## 2022-01-13 ENCOUNTER — Ambulatory Visit (INDEPENDENT_AMBULATORY_CARE_PROVIDER_SITE_OTHER): Payer: Medicaid Other

## 2022-01-13 ENCOUNTER — Ambulatory Visit (INDEPENDENT_AMBULATORY_CARE_PROVIDER_SITE_OTHER): Payer: Medicaid Other | Admitting: Physician Assistant

## 2022-01-13 ENCOUNTER — Encounter: Payer: Self-pay | Admitting: Physician Assistant

## 2022-01-13 VITALS — Ht 72.0 in | Wt 260.4 lb

## 2022-01-13 DIAGNOSIS — M25561 Pain in right knee: Secondary | ICD-10-CM

## 2022-01-13 DIAGNOSIS — J3089 Other allergic rhinitis: Secondary | ICD-10-CM

## 2022-01-13 DIAGNOSIS — I480 Paroxysmal atrial fibrillation: Secondary | ICD-10-CM | POA: Diagnosis not present

## 2022-01-13 MED ORDER — METHYLPREDNISOLONE ACETATE 40 MG/ML IJ SUSP
40.0000 mg | INTRAMUSCULAR | Status: AC | PRN
  Administered 2022-01-13: 40 mg via INTRA_ARTICULAR

## 2022-01-13 MED ORDER — LIDOCAINE HCL 1 % IJ SOLN
3.0000 mL | INTRAMUSCULAR | Status: AC | PRN
  Administered 2022-01-13: 3 mL

## 2022-01-13 NOTE — Progress Notes (Signed)
Office Visit Note   [name]           Date of Birth: December 24, 1960           MRN: [id number] Visit Date: 01/13/2022              Requested by: Jason Rakes, MD [address] [place],  [place] [address] PCP: Jason Rakes, MD   Assessment & Plan: Visit Diagnoses:  1. Acute pain of right knee     Plan: Ace bandage was applied to the knee today he will remove this before retiring to bed this evening.  We will see him back in 2 weeks to see how he does with the injection aspiration.  He may benefit from a supplemental injection in the future.  Follow-Up Instructions: Return in about 2 weeks (around 01/27/2022).   Orders:  Orders Placed This Encounter  Procedures   Large Joint Inj: R knee   XR Knee 1-2 Views Right   No orders of the defined types were placed in this encounter.     Procedures: Large Joint Inj: R knee on 01/13/2022 2:44 PM Indications: pain Details: 22 G 1.5 in needle, anterolateral approach  Arthrogram: No  Medications: 3 mL lidocaine 1 %; 40 mg methylPREDNISolone acetate 40 MG/ML Aspirate: 45 mL yellow Outcome: tolerated well, no immediate complications Procedure, treatment alternatives, risks and benefits explained, specific risks discussed. Consent was given by the patient. Immediately prior to procedure a time out was called to verify the correct patient, procedure, equipment, support staff and site/side marked as required. Patient was prepped and draped in the usual sterile fashion.       Clinical Data: No additional findings.   Subjective: Chief Complaint  Patient presents with   Right Knee - Pain    HPI Jason Stokes is well-known to Dr. Trevor Stokes service and has known osteoarthritis of both knees.  Comes in today due to right knee pain and swelling.  He hit his knee on something a week ago.  He was last seen on 12/08/2021 at that time his left knee was injected with cortisone injection in his right knee was  aspirated.  Had no fevers or chills. Review of Systems See HPI  Objective: Vital Signs: Ht 6' (1.829 m)   Wt 260 lb 6.4 oz (118.1 kg)   BMI 35.32 kg/m   Physical Exam Constitutional:      Appearance: He is not ill-appearing or diaphoretic.  Pulmonary:     Effort: Pulmonary effort is normal.  Neurological:     Mental Status: He is alert.  Psychiatric:        Mood and Affect: Mood normal.     Ortho Exam Right knee good range of motion.  Positive effusion right knee.  Patellofemoral crepitus. Specialty Comments:  No specialty comments available.  Imaging: XR Knee 1-2 Views Right  Result Date: 01/13/2022 Right knee 2 views show bone-on-bone medial compartment moderate to moderately severe narrowing of the lateral compartment moderate patellofemoral arthritic changes.  No acute fractures or acute findings knee is well located.    PMFS History: Patient Active Problem List   Diagnosis Date Noted   Secondary hypercoagulable state ([place]) 03/23/2021   Type 2 diabetes mellitus with hyperglycemia, without long-term current use of insulin ([place]) 02/10/2021   Neck pain on left side 03/06/2019   Musculoskeletal chest pain 07/24/2018   Asthma, mild intermittent 05/02/2018   Allergic rhinitis caused by mold 05/02/2018   Tobacco use 05/02/2018  Bilateral carpal tunnel syndrome 01/10/2018   Stroke ([place])    Schizophrenia ([place])    Migraine    History of nuclear stress test    History of alcohol abuse    GERD (gastroesophageal reflux disease)    Dysrhythmia    Chronic lower back pain    Anxiety    Arthritis of knee, right 04/12/2017   Bipolar disorder ([place]) 04/03/2017   Lipoma of forehead 09/22/2016   Trigger ring finger of right hand 06/22/2016   Paroxysmal atrial fibrillation (HCC)    AF (atrial fibrillation) ([place]) 12/10/2015   Eczema 07/10/2015   History of CVA (cerebrovascular accident) 05/04/2015   Tear of medial meniscus of right knee 03/18/2015   Chronic pain of right  knee 03/12/2015   Other and unspecified hyperlipidemia 11/25/2013   Nasal congestion 11/25/2013   Sinusitis, chronic 05/09/2013   Chronic low back pain 10/12/2012   Lumbar radiculopathy 10/12/2012   Hypertension 10/12/2012   Depression 10/12/2012   Insomnia 10/12/2012   Past Medical History:  Diagnosis Date   Anxiety    Bilateral carpal tunnel syndrome 01/10/2018   Bipolar disorder ([place])    Chronic lower back pain    Depression    Dysrhythmia    a-fib   GERD (gastroesophageal reflux disease)    History of alcohol abuse    History of nuclear stress test    Myoview 10/16: EF 50%, diaphragmatic attenuation, no ischemia, low risk   Hypertension    Migraine 2012-2014   Moderate persistent asthma with acute exacerbation 05/02/2018   PAF (paroxysmal atrial fibrillation) ([place]) 03/27/2015   a. Myoview neg for ischemia >> Flecainide started 10/16 >> FU ETT    Schizophrenia ([place])    Sciatica neuralgia    Small vessel disease (HCC)    Right basal ganglia stroke   Stroke [facility]) "between 2012-2014"   residual "AF" (09/22/2015)    Family History  Problem Relation Age of Onset   Heart disease Father    Schizophrenia Sister     Past Surgical History:  Procedure Laterality Date   ATRIAL FIBRILLATION ABLATION  09/22/2015   CYST EXCISION  1996-97   surgery back of head    ELECTROPHYSIOLOGIC STUDY N/A 09/22/2015   Procedure: Atrial Fibrillation Ablation;  Surgeon: Will [name], MD;  Location: [place] [facility];  Service: Cardiovascular;  Laterality: N/A;   ELECTROPHYSIOLOGIC STUDY N/A 12/10/2015   Procedure: Atrial Fibrillation Ablation;  Surgeon: Will [name], MD;  Location: [place] [facility];  Service: Cardiovascular;  Laterality: N/A;   ELECTROPHYSIOLOGIC STUDY N/A 12/11/2015   Procedure: Cardioversion;  Surgeon: Will [name], MD;  Location: [place] [facility];  Service: Cardiovascular;  Laterality: N/A;   EMBOLIZATION Right 08/19/2020   Procedure: EMBOLIZATION;   Surgeon: Cherre Robins, MD;  Location: [place] [facility];  Service: Cardiovascular;  Laterality: Right;  hypogastric   EXCISION MASS HEAD N/A 01/06/2017   Procedure: EXCISION MASS FOREHEAD;  Surgeon: Irene Limbo, MD;  Location: [place];  Service: Plastics;  Laterality: N/A;   GANGLION CYST EXCISION Left    INTERCOSTAL NERVE BLOCK  2005   KNEE ARTHROSCOPY Right 2016   MASS EXCISION N/A 01/25/2021   Procedure: EXCISION SUBCUTANEOUS VS SUBFASCIAL MASS TORSO 3CM;  Surgeon: Irene Limbo, MD;  Location: [facility];  Service: Plastics;  Laterality: N/A;   Social History   Occupational History    Comment: disabled  Tobacco Use   Smoking status: Every Day    Years:  29.00    Types: Cigarettes   Smokeless tobacco: Never   Tobacco comments:    1 cigarettes every day  06-02-2021  Vaping Use   Vaping Use: Never used  Substance and Sexual Activity   Alcohol use: Yes    Alcohol/week: 2.0 standard drinks of alcohol    Types: 2 Standard drinks or equivalent per week    Comment: 72mxed drinks   Drug use: Yes    Types: Marijuana    Comment: smoked marijuana 01-02-17 for pain   Sexual activity: Not Currently

## 2022-01-14 DIAGNOSIS — I480 Paroxysmal atrial fibrillation: Secondary | ICD-10-CM | POA: Diagnosis not present

## 2022-01-15 DIAGNOSIS — I480 Paroxysmal atrial fibrillation: Secondary | ICD-10-CM | POA: Diagnosis not present

## 2022-01-16 DIAGNOSIS — I480 Paroxysmal atrial fibrillation: Secondary | ICD-10-CM | POA: Diagnosis not present

## 2022-01-17 DIAGNOSIS — I480 Paroxysmal atrial fibrillation: Secondary | ICD-10-CM | POA: Diagnosis not present

## 2022-01-18 DIAGNOSIS — I480 Paroxysmal atrial fibrillation: Secondary | ICD-10-CM | POA: Diagnosis not present

## 2022-01-19 ENCOUNTER — Other Ambulatory Visit: Payer: Self-pay | Admitting: Cardiology

## 2022-01-20 DIAGNOSIS — I480 Paroxysmal atrial fibrillation: Secondary | ICD-10-CM | POA: Diagnosis not present

## 2022-01-21 DIAGNOSIS — H3093 Unspecified chorioretinal inflammation, bilateral: Secondary | ICD-10-CM | POA: Diagnosis not present

## 2022-01-21 DIAGNOSIS — H3022 Posterior cyclitis, left eye: Secondary | ICD-10-CM | POA: Diagnosis not present

## 2022-01-21 DIAGNOSIS — I480 Paroxysmal atrial fibrillation: Secondary | ICD-10-CM | POA: Diagnosis not present

## 2022-01-22 DIAGNOSIS — I480 Paroxysmal atrial fibrillation: Secondary | ICD-10-CM | POA: Diagnosis not present

## 2022-01-23 DIAGNOSIS — I480 Paroxysmal atrial fibrillation: Secondary | ICD-10-CM | POA: Diagnosis not present

## 2022-01-24 DIAGNOSIS — I480 Paroxysmal atrial fibrillation: Secondary | ICD-10-CM | POA: Diagnosis not present

## 2022-01-25 DIAGNOSIS — I480 Paroxysmal atrial fibrillation: Secondary | ICD-10-CM | POA: Diagnosis not present

## 2022-01-26 DIAGNOSIS — I480 Paroxysmal atrial fibrillation: Secondary | ICD-10-CM | POA: Diagnosis not present

## 2022-01-27 DIAGNOSIS — I480 Paroxysmal atrial fibrillation: Secondary | ICD-10-CM | POA: Diagnosis not present

## 2022-01-28 ENCOUNTER — Ambulatory Visit (INDEPENDENT_AMBULATORY_CARE_PROVIDER_SITE_OTHER): Payer: Medicaid Other | Admitting: *Deleted

## 2022-01-28 DIAGNOSIS — J309 Allergic rhinitis, unspecified: Secondary | ICD-10-CM

## 2022-01-28 DIAGNOSIS — I480 Paroxysmal atrial fibrillation: Secondary | ICD-10-CM | POA: Diagnosis not present

## 2022-01-29 DIAGNOSIS — I480 Paroxysmal atrial fibrillation: Secondary | ICD-10-CM | POA: Diagnosis not present

## 2022-01-30 ENCOUNTER — Other Ambulatory Visit: Payer: Self-pay | Admitting: Cardiology

## 2022-01-30 DIAGNOSIS — I480 Paroxysmal atrial fibrillation: Secondary | ICD-10-CM | POA: Diagnosis not present

## 2022-01-31 ENCOUNTER — Encounter: Payer: Self-pay | Admitting: Physician Assistant

## 2022-01-31 ENCOUNTER — Ambulatory Visit (INDEPENDENT_AMBULATORY_CARE_PROVIDER_SITE_OTHER): Payer: Medicaid Other | Admitting: Physician Assistant

## 2022-01-31 DIAGNOSIS — M1711 Unilateral primary osteoarthritis, right knee: Secondary | ICD-10-CM

## 2022-01-31 DIAGNOSIS — I480 Paroxysmal atrial fibrillation: Secondary | ICD-10-CM | POA: Diagnosis not present

## 2022-01-31 MED ORDER — HYDROCODONE-ACETAMINOPHEN 5-325 MG PO TABS
1.0000 | ORAL_TABLET | Freq: Four times a day (QID) | ORAL | 0 refills | Status: DC | PRN
Start: 2022-01-31 — End: 2022-02-07

## 2022-01-31 NOTE — Progress Notes (Signed)
In  Office Visit Note   [name]           Date of Birth: 1960-10-12           MRN: [id number] Visit Date: 01/31/2022              Requested by: Jason Rakes, MD [address] [place],  [place] [id number] PCP: Jason Rakes, MD   Assessment & Plan: Visit Diagnoses:  1. Unilateral primary osteoarthritis, right knee     Plan: Patient's to undergo an ablation for atrial fibrillation September.  We Jason see him back in November talk about possible knee replacement at that time.  He Jason need cardiac clearance.  He has failed conservative treatment and is requiring serial aspirations of his knee.  Questions were encouraged and answered at length.  Right knee was aspirated today 40 cc of synovial fluid was obtained patient tolerated well.  He was given hydrocodone for pain he is to use this sparingly.  Follow-Up Instructions: Return in about 4 months (around 06/03/2022).   Orders:  No orders of the defined types were placed in this encounter.  Meds ordered this encounter  Medications   HYDROcodone-acetaminophen (NORCO) 5-325 MG tablet    Sig: Take 1 tablet by mouth every 6 (six) hours as needed for moderate pain.    Dispense:  25 tablet    Refill:  0      Procedures: No procedures performed   Clinical Data: No additional findings.   Subjective: Chief Complaint  Patient presents with   Right Knee - Pain    HPI Jason Stokes comes in today for follow-up of his right knee.  He states the cortisone injection really gave him no relief.  He is having 8 out of 10 pain.  He is having constant pain at this point.  He is asking for something for pain.  He reports that he was previously in pain management clinic but is no longer in that clinic.  He notes that the fluid is came back on his knee.  Radiographs of his right knee shows bone-on-bone medial compartment and moderate to moderately severe narrowing lateral compartment.  Moderate changes of  patellofemoral joint.  He has atrial fibrillation he is due to undergo an ablation.  He is currently on Eliquis due to the atrial fibrillation. Review of Systems See HPI  Objective: Vital Signs: There were no vitals taken for this visit.  Physical Exam General: Well-developed well-nourished male no acute distress Ortho Exam Right knee good range of motion.  Tenderness along medial joint line.  Positive effusion.  Crepitus with passive range of motion of the knee. Specialty Comments:  No specialty comments available.  Imaging: No results found.   PMFS History: Patient Active Problem List   Diagnosis Date Noted   Secondary hypercoagulable state ([place]) 03/23/2021   Type 2 diabetes mellitus with hyperglycemia, without long-term current use of insulin ([place]) 02/10/2021   Neck pain on left side 03/06/2019   Musculoskeletal chest pain 07/24/2018   Asthma, mild intermittent 05/02/2018   Allergic rhinitis caused by mold 05/02/2018   Tobacco use 05/02/2018   Bilateral carpal tunnel syndrome 01/10/2018   Stroke ([place])    Schizophrenia (HCC)    Migraine    History of nuclear stress test    History of alcohol abuse    GERD (gastroesophageal reflux disease)    Dysrhythmia    Chronic lower back pain    Anxiety    Arthritis  of knee, right 04/12/2017   Bipolar disorder ([place]) 04/03/2017   Lipoma of forehead 09/22/2016   Trigger ring finger of right hand 06/22/2016   Paroxysmal atrial fibrillation (HCC)    AF (atrial fibrillation) ([place]) 12/10/2015   Eczema 07/10/2015   History of CVA (cerebrovascular accident) 05/04/2015   Tear of medial meniscus of right knee 03/18/2015   Chronic pain of right knee 03/12/2015   Other and unspecified hyperlipidemia 11/25/2013   Nasal congestion 11/25/2013   Sinusitis, chronic 05/09/2013   Chronic low back pain 10/12/2012   Lumbar radiculopathy 10/12/2012   Hypertension 10/12/2012   Depression 10/12/2012   Insomnia 10/12/2012   Past Medical History:   Diagnosis Date   Anxiety    Bilateral carpal tunnel syndrome 01/10/2018   Bipolar disorder ([place])    Chronic lower back pain    Depression    Dysrhythmia    a-fib   GERD (gastroesophageal reflux disease)    History of alcohol abuse    History of nuclear stress test    Myoview 10/16: EF 50%, diaphragmatic attenuation, no ischemia, low risk   Hypertension    Migraine 2012-2014   Moderate persistent asthma with acute exacerbation 05/02/2018   PAF (paroxysmal atrial fibrillation) ([place]) 03/27/2015   a. Myoview neg for ischemia >> Flecainide started 10/16 >> FU ETT    Schizophrenia ([place])    Sciatica neuralgia    Small vessel disease (HCC)    Right basal ganglia stroke   Stroke [facility]) "between 2012-2014"   residual "AF" (09/22/2015)    Family History  Problem Relation Age of Onset   Heart disease Father    Schizophrenia Sister     Past Surgical History:  Procedure Laterality Date   ATRIAL FIBRILLATION ABLATION  09/22/2015   CYST EXCISION  1996-97   surgery back of head    ELECTROPHYSIOLOGIC STUDY N/A 09/22/2015   Procedure: Atrial Fibrillation Ablation;  Surgeon: Jason [name], MD;  Location: [place] [facility];  Service: Cardiovascular;  Laterality: N/A;   ELECTROPHYSIOLOGIC STUDY N/A 12/10/2015   Procedure: Atrial Fibrillation Ablation;  Surgeon: Jason [name], MD;  Location: [place] [facility];  Service: Cardiovascular;  Laterality: N/A;   ELECTROPHYSIOLOGIC STUDY N/A 12/11/2015   Procedure: Cardioversion;  Surgeon: Jason [name], MD;  Location: [place] [facility];  Service: Cardiovascular;  Laterality: N/A;   EMBOLIZATION Right 08/19/2020   Procedure: EMBOLIZATION;  Surgeon: Jason Robins, MD;  Location: [place] [facility];  Service: Cardiovascular;  Laterality: Right;  hypogastric   EXCISION MASS HEAD N/A 01/06/2017   Procedure: EXCISION MASS FOREHEAD;  Surgeon: Jason Limbo, MD;  Location: [facility];  Service: Plastics;  Laterality: N/A;    GANGLION CYST EXCISION Left    INTERCOSTAL NERVE BLOCK  2005   KNEE ARTHROSCOPY Right 2016   MASS EXCISION N/A 01/25/2021   Procedure: EXCISION SUBCUTANEOUS VS SUBFASCIAL MASS TORSO 3CM;  Surgeon: Jason Limbo, MD;  Location: [facility];  Service: Plastics;  Laterality: N/A;   Social History   Occupational History    Comment: disabled  Tobacco Use   Smoking status: Every Day    Years: 29.00    Types: Cigarettes   Smokeless tobacco: Never   Tobacco comments:    1 cigarettes every day  06-02-2021  Vaping Use   Vaping Use: Never used  Substance and Sexual Activity   Alcohol use: Yes    Alcohol/week: 2.0 standard drinks of alcohol    Types: 2 Standard drinks  or equivalent per week    Comment: 45mxed drinks   Drug use: Yes    Types: Marijuana    Comment: smoked marijuana 01-02-17 for pain   Sexual activity: Not Currently

## 2022-02-01 DIAGNOSIS — I480 Paroxysmal atrial fibrillation: Secondary | ICD-10-CM | POA: Diagnosis not present

## 2022-02-02 ENCOUNTER — Ambulatory Visit (INDEPENDENT_AMBULATORY_CARE_PROVIDER_SITE_OTHER): Payer: Medicaid Other | Admitting: *Deleted

## 2022-02-02 DIAGNOSIS — J309 Allergic rhinitis, unspecified: Secondary | ICD-10-CM

## 2022-02-02 DIAGNOSIS — I480 Paroxysmal atrial fibrillation: Secondary | ICD-10-CM | POA: Diagnosis not present

## 2022-02-03 DIAGNOSIS — I480 Paroxysmal atrial fibrillation: Secondary | ICD-10-CM | POA: Diagnosis not present

## 2022-02-04 DIAGNOSIS — I480 Paroxysmal atrial fibrillation: Secondary | ICD-10-CM | POA: Diagnosis not present

## 2022-02-05 DIAGNOSIS — I480 Paroxysmal atrial fibrillation: Secondary | ICD-10-CM | POA: Diagnosis not present

## 2022-02-06 DIAGNOSIS — I480 Paroxysmal atrial fibrillation: Secondary | ICD-10-CM | POA: Diagnosis not present

## 2022-02-07 ENCOUNTER — Other Ambulatory Visit: Payer: Self-pay | Admitting: Physician Assistant

## 2022-02-07 ENCOUNTER — Telehealth: Payer: Self-pay | Admitting: Physician Assistant

## 2022-02-07 DIAGNOSIS — I480 Paroxysmal atrial fibrillation: Secondary | ICD-10-CM | POA: Diagnosis not present

## 2022-02-07 MED ORDER — HYDROCODONE-ACETAMINOPHEN 5-325 MG PO TABS: 1.0000 | ORAL_TABLET | Freq: Four times a day (QID) | ORAL | 0 refills | Status: DC | PRN

## 2022-02-07 NOTE — Telephone Encounter (Signed)
Pt called stating he need his prescription of hydrocodone to go to [facility] on [address]. Can you please send resend hydrocodone to [facility]. Please call pt when sent at [phone].

## 2022-02-08 DIAGNOSIS — I480 Paroxysmal atrial fibrillation: Secondary | ICD-10-CM | POA: Diagnosis not present

## 2022-02-09 DIAGNOSIS — I480 Paroxysmal atrial fibrillation: Secondary | ICD-10-CM | POA: Diagnosis not present

## 2022-02-10 ENCOUNTER — Ambulatory Visit (INDEPENDENT_AMBULATORY_CARE_PROVIDER_SITE_OTHER): Payer: Medicaid Other

## 2022-02-10 DIAGNOSIS — J309 Allergic rhinitis, unspecified: Secondary | ICD-10-CM

## 2022-02-10 DIAGNOSIS — I480 Paroxysmal atrial fibrillation: Secondary | ICD-10-CM | POA: Diagnosis not present

## 2022-02-10 MED ORDER — EPINEPHRINE 0.3 MG/0.3ML IJ SOAJ: 0.3000 mg | INTRAMUSCULAR | 1 refills | Status: DC | PRN

## 2022-02-10 NOTE — Progress Notes (Signed)
Patient came in and asked if his Epipen was expired. I informed patient that unfortunately I can not tell him if its expired unless I have it. I informed patient to look on his epi pen for the expiration date. After patient left I realized an Epipen has not been sent in since 2021. I called and left a message informing patient that I went ahead a sent a refill as the one at home is probably expired due to the time frame since we have sent one in.

## 2022-02-11 DIAGNOSIS — I480 Paroxysmal atrial fibrillation: Secondary | ICD-10-CM | POA: Diagnosis not present

## 2022-02-12 DIAGNOSIS — I480 Paroxysmal atrial fibrillation: Secondary | ICD-10-CM | POA: Diagnosis not present

## 2022-02-13 DIAGNOSIS — I480 Paroxysmal atrial fibrillation: Secondary | ICD-10-CM | POA: Diagnosis not present

## 2022-02-14 DIAGNOSIS — I480 Paroxysmal atrial fibrillation: Secondary | ICD-10-CM | POA: Diagnosis not present

## 2022-02-15 DIAGNOSIS — I480 Paroxysmal atrial fibrillation: Secondary | ICD-10-CM | POA: Diagnosis not present

## 2022-02-16 ENCOUNTER — Telehealth: Payer: Self-pay | Admitting: Cardiology

## 2022-02-16 DIAGNOSIS — I480 Paroxysmal atrial fibrillation: Secondary | ICD-10-CM | POA: Diagnosis not present

## 2022-02-16 NOTE — Telephone Encounter (Signed)
Left message for patient to call back  

## 2022-02-16 NOTE — Telephone Encounter (Signed)
Pt c/o swelling: STAT is pt has developed SOB within 24 hours  How much weight have you gained and in what time span? Not sure  If swelling, where is the swelling located? From calves down to ankles and feet and in both legs   Are you currently taking a fluid pill? He believes so   Are you currently SOB? No  Do you have a log of your daily weights (if so, list)?   Have you gained 3 pounds in a day or 5 pounds in a week? Yes, but he believes from steroid he has been taking from another doctor.   Have you traveled recently? No

## 2022-02-17 DIAGNOSIS — Z1152 Encounter for screening for COVID-19: Secondary | ICD-10-CM | POA: Diagnosis not present

## 2022-02-17 DIAGNOSIS — I480 Paroxysmal atrial fibrillation: Secondary | ICD-10-CM | POA: Diagnosis not present

## 2022-02-17 NOTE — Telephone Encounter (Signed)
Left message to call back  

## 2022-02-17 NOTE — Telephone Encounter (Signed)
Reports being on steroids for about a month now for his "eyes". Taking oral and gtt Unsure how long he will remain on these steroids, he goes back Monday to discuss. PCP prescribed Lasix 20 mg daily PRN d/t reported edema. Taking daily for the past 3 weeks without resolution. Pt informed that most likely Dr. Curt Bears will recommend he follow up w/ ordering provider. However I did instruct pt that if goes back to PCP on this then he needs f/u BMET to check kidney function as he has been taking lasix daily. Aware it may be tomorrow before return call on this. Patient verbalized understanding and agreeable to plan.

## 2022-02-18 ENCOUNTER — Ambulatory Visit (INDEPENDENT_AMBULATORY_CARE_PROVIDER_SITE_OTHER): Payer: Medicaid Other

## 2022-02-18 DIAGNOSIS — J309 Allergic rhinitis, unspecified: Secondary | ICD-10-CM

## 2022-02-18 DIAGNOSIS — I480 Paroxysmal atrial fibrillation: Secondary | ICD-10-CM | POA: Diagnosis not present

## 2022-02-18 NOTE — Telephone Encounter (Signed)
Pt advised to continue to follow up w/ PCP on this matter. At this time cardiology doesn't need to be involved, but to let us know if no improvement. Patient verbalized understanding and agreeable to plan.

## 2022-02-19 ENCOUNTER — Other Ambulatory Visit: Payer: Self-pay | Admitting: Cardiology

## 2022-02-19 DIAGNOSIS — I480 Paroxysmal atrial fibrillation: Secondary | ICD-10-CM | POA: Diagnosis not present

## 2022-02-20 DIAGNOSIS — I480 Paroxysmal atrial fibrillation: Secondary | ICD-10-CM | POA: Diagnosis not present

## 2022-02-21 DIAGNOSIS — I480 Paroxysmal atrial fibrillation: Secondary | ICD-10-CM | POA: Diagnosis not present

## 2022-02-22 DIAGNOSIS — I480 Paroxysmal atrial fibrillation: Secondary | ICD-10-CM | POA: Diagnosis not present

## 2022-02-23 DIAGNOSIS — I480 Paroxysmal atrial fibrillation: Secondary | ICD-10-CM | POA: Diagnosis not present

## 2022-02-24 ENCOUNTER — Ambulatory Visit (INDEPENDENT_AMBULATORY_CARE_PROVIDER_SITE_OTHER): Payer: Medicaid Other

## 2022-02-24 DIAGNOSIS — J309 Allergic rhinitis, unspecified: Secondary | ICD-10-CM

## 2022-02-24 DIAGNOSIS — I480 Paroxysmal atrial fibrillation: Secondary | ICD-10-CM | POA: Diagnosis not present

## 2022-02-25 DIAGNOSIS — I480 Paroxysmal atrial fibrillation: Secondary | ICD-10-CM | POA: Diagnosis not present

## 2022-02-26 DIAGNOSIS — I480 Paroxysmal atrial fibrillation: Secondary | ICD-10-CM | POA: Diagnosis not present

## 2022-02-27 DIAGNOSIS — I480 Paroxysmal atrial fibrillation: Secondary | ICD-10-CM | POA: Diagnosis not present

## 2022-02-28 DIAGNOSIS — I480 Paroxysmal atrial fibrillation: Secondary | ICD-10-CM | POA: Diagnosis not present

## 2022-03-01 DIAGNOSIS — I480 Paroxysmal atrial fibrillation: Secondary | ICD-10-CM | POA: Diagnosis not present

## 2022-03-02 ENCOUNTER — Other Ambulatory Visit: Payer: Self-pay | Admitting: Cardiology

## 2022-03-02 DIAGNOSIS — I480 Paroxysmal atrial fibrillation: Secondary | ICD-10-CM

## 2022-03-02 NOTE — Telephone Encounter (Signed)
Pt last saw Dr Curt Bears 12/24/21, last labs 09/29/21 Creat 1.19, age 61, weight 118.1kg,based on specified criteria pt is on appropriate dosage of Eliquis '5mg'$  BID for afib.  Will refill rx.

## 2022-03-03 DIAGNOSIS — I480 Paroxysmal atrial fibrillation: Secondary | ICD-10-CM | POA: Diagnosis not present

## 2022-03-04 ENCOUNTER — Ambulatory Visit (INDEPENDENT_AMBULATORY_CARE_PROVIDER_SITE_OTHER): Payer: Medicaid Other

## 2022-03-04 DIAGNOSIS — I480 Paroxysmal atrial fibrillation: Secondary | ICD-10-CM | POA: Diagnosis not present

## 2022-03-04 DIAGNOSIS — J309 Allergic rhinitis, unspecified: Secondary | ICD-10-CM

## 2022-03-04 DIAGNOSIS — H3022 Posterior cyclitis, left eye: Secondary | ICD-10-CM | POA: Diagnosis not present

## 2022-03-04 DIAGNOSIS — H3021 Posterior cyclitis, right eye: Secondary | ICD-10-CM | POA: Diagnosis not present

## 2022-03-05 DIAGNOSIS — I480 Paroxysmal atrial fibrillation: Secondary | ICD-10-CM | POA: Diagnosis not present

## 2022-03-06 DIAGNOSIS — I480 Paroxysmal atrial fibrillation: Secondary | ICD-10-CM | POA: Diagnosis not present

## 2022-03-07 DIAGNOSIS — I480 Paroxysmal atrial fibrillation: Secondary | ICD-10-CM | POA: Diagnosis not present

## 2022-03-08 DIAGNOSIS — I480 Paroxysmal atrial fibrillation: Secondary | ICD-10-CM | POA: Diagnosis not present

## 2022-03-09 ENCOUNTER — Ambulatory Visit: Payer: Medicaid Other | Attending: Family Medicine | Admitting: Family Medicine

## 2022-03-09 ENCOUNTER — Ambulatory Visit: Payer: Self-pay | Admitting: *Deleted

## 2022-03-09 ENCOUNTER — Encounter: Payer: Self-pay | Admitting: Family Medicine

## 2022-03-09 DIAGNOSIS — U071 COVID-19: Secondary | ICD-10-CM

## 2022-03-09 DIAGNOSIS — I480 Paroxysmal atrial fibrillation: Secondary | ICD-10-CM | POA: Diagnosis not present

## 2022-03-09 DIAGNOSIS — M5416 Radiculopathy, lumbar region: Secondary | ICD-10-CM | POA: Diagnosis not present

## 2022-03-09 MED ORDER — HYDROCOD POLI-CHLORPHE POLI ER 10-8 MG/5ML PO SUER: 5.0000 mL | Freq: Two times a day (BID) | ORAL | 0 refills | Status: DC | PRN

## 2022-03-09 MED ORDER — MOLNUPIRAVIR EUA 200MG CAPSULE: 4.0000 | ORAL_CAPSULE | Freq: Two times a day (BID) | ORAL | 0 refills | Status: AC

## 2022-03-09 MED ORDER — PREDNISONE 20 MG PO TABS: 20.0000 mg | ORAL_TABLET | Freq: Every day | ORAL | 0 refills | Status: DC

## 2022-03-09 NOTE — Telephone Encounter (Signed)
Please place him on my schedule at 130p today for a virtual visit. Thanks

## 2022-03-09 NOTE — Telephone Encounter (Signed)
Per agent: "Patient said he tested positive for Covid on 03/08/22. Patient said he is experiencing a runny nose, cough and body aches. Patient is requesting an anti-viral medication. Please advise patient."   Chief Complaint: Covid Positive Symptoms: Body aches, productive cough, itchy throat, runny nose. Son wife and daughter all positive. Frequency: Symptoms onset Sat. Pertinent Negatives: Patient denies worsening SOB, fever. H/O asthma no worsening Disposition: '[]'$ ED /'[]'$ Urgent Care (no appt availability in office) / '[]'$ Appointment(In office/virtual)/ '[]'$  [facility]/ '[]'$ Home Care/ '[]'$ Refused Recommended Disposition /'[]'$ [facility]/ '[x]'$  Follow-up with PCP Additional Notes: Pt reluctant to triage, states "I just want those pills, I have a lot of appts coming up." Pt is considered  High Risk per protocol. Offered to review self isolation guideline, declines, "I've had this 3 times now." Assured pt NT would route to practice for PCPs review. Pt interested in oral anti-viral med. Please advise.  Reason for Disposition  [1] HIGH RISK patient (e.g., weak immune system, age > 44 years, obesity with BMI 30 or higher, pregnant, chronic lung disease or other chronic medical condition) AND [2] COVID symptoms (e.g., cough, fever)  (Exceptions: Already seen by PCP and no new or worsening symptoms.)  Answer Assessment - Initial Assessment Questions 1. COVID-19 DIAGNOSIS: "How do you know that you have COVID?" (e.g., positive lab test or self-test, diagnosed by doctor or NP/PA, symptoms after exposure).     Home test 2. COVID-19 EXPOSURE: "Was there any known exposure to COVID before the symptoms began?" CDC Definition of close contact: within 6 feet (2 meters) for a total of 15 minutes or more over a 24-hour period.      Son, wife positive 3. ONSET: "When did the COVID-19 symptoms start?"      Saturday night 4. WORST SYMPTOM: "What is your worst symptom?" (e.g., cough, fever, shortness of  breath, muscle aches)     Runny nose and itchy throat 5. COUGH: "Do you have a cough?" If Yes, ask: "How bad is the cough?"       Cough, yellowish mucous 6. FEVER: "Do you have a fever?" If Yes, ask: "What is your temperature, how was it measured, and when did it start?"     No 7. RESPIRATORY STATUS: "Describe your breathing?" (e.g., normal; shortness of breath, wheezing, unable to speak)      "Every blue moon SOB with my Asthma." 8. BETTER-SAME-WORSE: "Are you getting better, staying the same or getting worse compared to yesterday?"  If getting worse, ask, "In what way?"     Worse with runny nose then stopped up 9. OTHER SYMPTOMS: "Do you have any other symptoms?"  (e.g., chills, fatigue, headache, loss of smell or taste, muscle pain, sore throat)     Body aches 10. HIGH RISK DISEASE: "Do you have any chronic medical problems?" (e.g., asthma, heart or lung disease, weak immune system, obesity, etc.)       Asthma, heart 11. VACCINE: "Have you had the COVID-19 vaccine?" If Yes, ask: "Which one, how many shots, when did you get it?"       2 vaccines, boosters "All"  13. O2 SATURATION MONITOR:  "Do you use an oxygen saturation monitor (pulse oximeter) at home?" If Yes, ask "What is your reading (oxygen level) today?" "What is your usual oxygen saturation reading?" (e.g., 95%)  Protocols used: Coronavirus (COVID-19) Diagnosed or Suspected-A-AH

## 2022-03-09 NOTE — Progress Notes (Signed)
Virtual Visit via Telephone Note  I connected with Jason Stokes, on 03/09/2022 at 1:40 PM by telephone and verified that I am speaking with the correct person using two identifiers.   Consent: I discussed the limitations, risks, security and privacy concerns of performing an evaluation and management service by telephone and the availability of in person appointments. I also discussed with the patient that there may be a patient responsible charge related to this service. The patient expressed understanding and agreed to proceed.   Location of Patient: Home  Location of Provider: Clinic   Persons participating in Telemedicine visit: Jason Stokes Dr. Margarita Rana     History of Present Illness: Jason Stokes is a 61 y.o. year old male with a history of hypertension, type 2 diabetes mellitus (diet-controlled A1c 6.4), bipolar disorder, paroxysmal A. 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  He has received 2 doses of COVID-19 vaccine and the booster. He has had rhinorrhea, stuffed up nostril, myalgia, coughing, sneezing, wheezing x 3 days but no fever.  His wife also tested positive for COVID-19. He initially thought it was his allergies acting up.  Also requesting a new pain management referral as I referred him to pain management previously but he was referred to [facility] and he does not want to see them as he has seen them in the past.  He states he did not get along with them. Past Medical History:  Diagnosis Date   Anxiety    Bilateral carpal tunnel syndrome 01/10/2018   Bipolar disorder (HCC)    Chronic lower back pain    Depression    Dysrhythmia    a-fib   GERD (gastroesophageal reflux disease)    History of alcohol  abuse    History of nuclear stress test    Myoview 10/16: EF 50%, diaphragmatic attenuation, no ischemia, low risk   Hypertension    Migraine 2012-2014   Moderate persistent asthma with acute exacerbation 05/02/2018   PAF (paroxysmal atrial fibrillation) ([place]) 03/27/2015   a. Myoview neg for ischemia >> Flecainide started 10/16 >> FU ETT    Schizophrenia ([place])    Sciatica neuralgia    Small vessel disease (HCC)    Right basal ganglia stroke   Stroke [facility]) "between 2012-2014"   residual "AF" (09/22/2015)   Allergies  Allergen Reactions   Lisinopril Anaphylaxis    Swelling of lips and tongue.    Current Outpatient Medications on File Prior to Visit  Medication Sig Dispense Refill   albuterol (VENTOLIN HFA) 108 (90 Base) MCG/ACT inhaler Inhale 2 puffs into the lungs every 4 (four) hours as needed for wheezing or shortness of breath. 54 g 1   amiodarone (PACERONE) 200 MG tablet Take 2 tablets (400 mg total) TWICE a day for 2 weeks, then take 1 tablet (200 mg total) TWICE a day for 2 weeks, then, take 1 tablet (200 mg total) ONCE a day 84 tablet 0   amiodarone (PACERONE) 200 MG tablet Take 1 tablet (200 mg total) by mouth daily. 90 tablet 1   apixaban (ELIQUIS) 5 MG TABS tablet TAKE 1 TABLET BY MOUTH TWICE A DAY 60 tablet 5   atorvastatin (LIPITOR) 40 MG tablet Take 1 tablet (40 mg total) by mouth daily. 90 tablet  1   azelastine (ASTELIN) 0.1 % nasal spray Place 2 sprays into both nostrils 2 (two) times daily. Use in each nostril as directed (Patient taking differently: Place 1 spray into both nostrils 2 (two) times daily. Use in each nostril as directed) 30 mL 5   carvedilol (COREG) 25 MG tablet TAKE 1 TABLET BY MOUTH TWICE A DAY 180 tablet 2   cetirizine (ZYRTEC) 10 MG tablet Take 1 tablet (10 mg total) by mouth 2 (two) times daily as needed for allergies (Can take an extra dose during flare ups.). 60 tablet 5   Difluprednate 0.05 % EMUL Place 1 drop into both eyes 2 (two) times daily.      diltiazem (CARDIZEM CD) 180 MG 24 hr capsule TAKE 1 CAPSULE BY MOUTH EVERY DAY 90 capsule 3   EPINEPHrine 0.3 mg/0.3 mL IJ SOAJ injection Inject 0.3 mg into the muscle as needed for anaphylaxis. 0.3 mL 1   fluticasone (FLONASE) 50 MCG/ACT nasal spray Place 1 spray into both nostrils 2 (two) times daily. 16 g 5   fluticasone furoate-vilanterol (BREO ELLIPTA) 200-25 MCG/ACT AEPB Inhale 1 puff into the lungs daily. 28 each 5   folic acid (FOLVITE) 1 MG tablet Take 1 mg by mouth daily.     GARLIC PO Take 1 tablet by mouth daily.     hydrALAZINE (APRESOLINE) 100 MG tablet TAKE 1 TABLET (100 MG TOTAL) BY MOUTH 3 (THREE) TIMES DAILY. 270 tablet 2   HYDROcodone-acetaminophen (NORCO) 5-325 MG tablet Take 1 tablet by mouth every 6 (six) hours as needed for moderate pain. 25 tablet 0   methotrexate (RHEUMATREX) 2.5 MG tablet Take 20 mg by mouth every Monday.     Misc. Devices MISC Back brace. Diagnosis chronic back pain 1 each 0   Misc. Devices MISC Left knee brace. Diagnosis L knee pain 1 each 0   montelukast (SINGULAIR) 10 MG tablet Take 1 tablet (10 mg total) by mouth at bedtime. 30 tablet 5   Olopatadine HCl (PATADAY) 0.2 % SOLN Place 1 drop into both eyes daily as needed. 2.5 mL 5   omeprazole (PRILOSEC) 20 MG capsule TAKE 1 CAPSULE BY MOUTH EVERY DAY 90 capsule 1   predniSONE (DELTASONE) 20 MG tablet Take 1 tablet (20 mg total) by mouth daily with breakfast. 5 tablet 0   Vitamin D3 (VITAMIN D) 25 MCG tablet Take 1,000 Units by mouth daily.     No current facility-administered medications on file prior to visit.    ROS: See HPI  Observations/Objective: Awake, alert, oriented x3 Not in acute distress Normal mood      Latest Ref Rng & Units 09/29/2021    4:46 PM 02/09/2021    4:59 PM 08/19/2020    7:47 AM  CMP  Glucose 70 - 99 mg/dL 136  404  111   BUN 8 - 27 mg/dL '15  16  12   '$ Creatinine 0.76 - 1.27 mg/dL 1.19  1.14  1.30   Sodium 134 - 144 mmol/L 140  137  141   Potassium 3.5 - 5.2  mmol/L 3.7  4.6  3.6   Chloride 96 - 106 mmol/L 103  100  104   CO2 20 - 29 mmol/L 22  20    Calcium 8.6 - 10.2 mg/dL 9.3  9.4    Total Protein 6.0 - 8.5 g/dL 7.0     Total Bilirubin 0.0 - 1.2 mg/dL 0.5     Alkaline Phos 44 - 121 IU/L 80  AST 0 - 40 IU/L 11     ALT 0 - 44 IU/L 17       Lipid Panel     Component Value Date/Time   CHOL 171 05/07/2018 1004   TRIG 204 (H) 05/07/2018 1004   HDL 33 (L) 05/07/2018 1004   CHOLHDL 5.2 (H) 05/07/2018 1004   CHOLHDL 5.4 10/17/2013 1042   VLDL 36 10/17/2013 1042   LDLCALC 97 05/07/2018 1004   LABVLDL 41 (H) 05/07/2018 1004    Lab Results  Component Value Date   HGBA1C 5.8 09/29/2021    Assessment and Plan: 1. COVID-19 virus infection He is at high risk for decompensation due to underlying comorbid illnesses At the moment he is not in respiratory distress but advised to go to the ED if this occurs Unable to place on Paxlovid due to interaction with amiodarone hence will place on molnupiravir - molnupiravir EUA (LAGEVRIO) 200 mg CAPS capsule; Take 4 capsules (800 mg total) by mouth 2 (two) times daily for 5 days.  Dispense: 40 capsule; Refill: 0 - chlorpheniramine-HYDROcodone (TUSSIONEX) 10-8 MG/5ML; Take 5 mLs by mouth every 12 (twelve) hours as needed for cough.  Dispense: 120 mL; Refill: 0 - predniSONE (DELTASONE) 20 MG tablet; Take 1 tablet (20 mg total) by mouth daily with breakfast.  Dispense: 5 tablet; Refill: 0  2. Lumbar radiculopathy Uncontrolled He was seen by [facility] pain management but would like to go to another pain clinic instead - Ambulatory referral to Pain Clinic   Follow Up Instructions: Has upcoming appointment in 2 weeks   I discussed the assessment and treatment plan with the patient. The patient was provided an opportunity to ask questions and all were answered. The patient agreed with the plan and demonstrated an understanding of the instructions.   The patient was advised to call back or seek an  in-person evaluation if the symptoms worsen or if the condition fails to improve as anticipated.     I provided 12 minutes total of non-face-to-face time during this encounter.   Charlott Rakes, MD, FAAFP. [facility] and [place], [place]   03/09/2022, 1:40 PM

## 2022-03-09 NOTE — Patient Instructions (Addendum)
Molnupiravir Capsules What is this medication? MOLNUPIRAVIR (MOL nue PIR a vir) treats mild to moderate COVID-19. It may help people who are at high risk of developing severe illness. This medication works by limiting the spread of the virus in your body. The FDA has allowed the emergency use of this medication. This medicine may be used for other purposes; ask your health care provider or pharmacist if you have questions. COMMON BRAND NAME(S): LAGEVRIO What should I tell my care team before I take this medication? They need to know if you have any of these conditions: Any allergies Any serious illness An unusual or allergic reaction to molnupiravir, other medications, foods, dyes, or preservatives Pregnant or trying to get pregnant Breast-feeding How should I use this medication? Take this medication by mouth with water. Take it as directed on the prescription label at the same time every day. Do not cut, crush, or chew this medication. Swallow the capsules whole. You can take it with or without food. If it upsets your stomach, take it with food. Take all of it unless your care team tells you to stop it early. Keep taking it even if you think you are better. Talk to your care team about the use of this medication in children. Special care may be needed. Overdosage: If you think you have taken too much of this medicine contact a poison control center or emergency room at once. NOTE: This medicine is only for you. Do not share this medicine with others. What if I miss a dose? If you miss a dose, take it as soon as you can unless it is more than 10 hours late. If it is more than 10 hours late, skip the missed dose. Take the next dose at the normal time. Do not take extra or 2 doses at the same time to make up for the missed dose. What may interact with this medication? Interactions have not been studied. This list may not describe all possible interactions. Give your health care provider a list of  all the medicines, herbs, non-prescription drugs, or dietary supplements you use. Also tell them if you smoke, drink alcohol, or use illegal drugs. Some items may interact with your medicine. What should I watch for while using this medication? Your condition will be monitored carefully while you are receiving this medication. Visit your care team for regular checkups. Tell your care team if your symptoms do not start to get better or if they get worse. Do not become pregnant while taking this medication. You may need a pregnancy test before starting this medication. Women must use a reliable form of birth control while taking this medication and for 4 days after stopping the medication. Women should inform their care team if they wish to become pregnant or think they might be pregnant. Men should not father a child while taking this medication and for 3 months after stopping it. There is potential for serious harm to an unborn child. Talk to your care team for more information. Do not breast-feed an infant while taking this medication and for 4 days after stopping the medication. What side effects may I notice from receiving this medication? Side effects that you should report to your care team as soon as possible: Allergic reactions--skin rash, itching, hives, swelling of the face, lips, tongue, or throat Side effects that usually do not require medical attention (report these to your care team if they continue or are bothersome): Diarrhea Dizziness Nausea This list may not  describe all possible side effects. Call your doctor for medical advice about side effects. You may report side effects to FDA at 1-800-FDA-1088. Where should I keep my medication? Keep out of the reach of children and pets. Store at room temperature between 20 and 25 degrees C (68 and 77 degrees F). Get rid of any unused medication after the expiration date. To get rid of medications that are no longer needed or have  expired: Take the medication to a medication take-back program. Check with your pharmacy or law enforcement to find a location. If you cannot return the medication, check the label or package insert to see if the medication should be thrown out in the garbage or flushed down the toilet. If you are not sure, ask your care team. If it is safe to put it in the trash, take the medication out of the container. Mix the medication with cat litter, dirt, coffee grounds, or other unwanted substance. Seal the mixture in a bag or container. Put it in the trash. NOTE: This sheet is a summary. It may not cover all possible information. If you have questions about this medicine, talk to your doctor, pharmacist, or health care provider.  2023 Elsevier/Gold Standard (2020-07-13 00:00:00)

## 2022-03-09 NOTE — Telephone Encounter (Signed)
Routing to PCP for review.

## 2022-03-10 DIAGNOSIS — I480 Paroxysmal atrial fibrillation: Secondary | ICD-10-CM | POA: Diagnosis not present

## 2022-03-10 NOTE — Telephone Encounter (Signed)
Pt has been seen 

## 2022-03-11 DIAGNOSIS — I480 Paroxysmal atrial fibrillation: Secondary | ICD-10-CM | POA: Diagnosis not present

## 2022-03-12 DIAGNOSIS — I480 Paroxysmal atrial fibrillation: Secondary | ICD-10-CM | POA: Diagnosis not present

## 2022-03-13 DIAGNOSIS — I480 Paroxysmal atrial fibrillation: Secondary | ICD-10-CM | POA: Diagnosis not present

## 2022-03-14 DIAGNOSIS — I480 Paroxysmal atrial fibrillation: Secondary | ICD-10-CM | POA: Diagnosis not present

## 2022-03-15 DIAGNOSIS — I480 Paroxysmal atrial fibrillation: Secondary | ICD-10-CM | POA: Diagnosis not present

## 2022-03-16 ENCOUNTER — Ambulatory Visit (INDEPENDENT_AMBULATORY_CARE_PROVIDER_SITE_OTHER): Payer: Medicaid Other | Admitting: *Deleted

## 2022-03-16 ENCOUNTER — Ambulatory Visit: Payer: Medicaid Other | Attending: Physician Assistant | Admitting: Physician Assistant

## 2022-03-16 ENCOUNTER — Encounter: Payer: Self-pay | Admitting: Physician Assistant

## 2022-03-16 VITALS — BP 148/94 | HR 69 | Ht 73.0 in | Wt 250.2 lb

## 2022-03-16 DIAGNOSIS — J309 Allergic rhinitis, unspecified: Secondary | ICD-10-CM

## 2022-03-16 DIAGNOSIS — M67431 Ganglion, right wrist: Secondary | ICD-10-CM | POA: Diagnosis not present

## 2022-03-16 DIAGNOSIS — E1169 Type 2 diabetes mellitus with other specified complication: Secondary | ICD-10-CM | POA: Insufficient documentation

## 2022-03-16 DIAGNOSIS — I480 Paroxysmal atrial fibrillation: Secondary | ICD-10-CM | POA: Diagnosis not present

## 2022-03-16 MED ORDER — ATORVASTATIN CALCIUM 40 MG PO TABS: 40.0000 mg | ORAL_TABLET | Freq: Every day | ORAL | 1 refills | Status: DC

## 2022-03-16 NOTE — Progress Notes (Signed)
Patient ID: Jason Stokes, male   DOB: [birth year], 61 y.o.   MRN: [id number]   Jason Stokes, is a 61 y.o. male  Jason Stokes  Jason Stokes  DOB - Stokes  Chief Complaint  Patient presents with   Referral    For skin tag removal        Subjective:   Jason Stokes is a 61 y.o. male here today for ganglion cyst on R wrist.  He prefers an appt with Dr Roney Marion has this written on a card that I gave back to him after referral was placed.  It is causing him pain and he would like to have it removed.    He has an appt with [facility] 9/29 for labs.  A1C=5.8 in March 2023  He had covid 2 weeks ago.  He is feeling much better  No problems updated.  ALLERGIES: Allergies  Allergen Reactions   Lisinopril Anaphylaxis    Swelling of lips and tongue.    PAST MEDICAL HISTORY: Past Medical History:  Diagnosis Date   Anxiety    Bilateral carpal tunnel syndrome 01/10/2018   Bipolar disorder ([place])    Chronic lower back pain    Depression    Dysrhythmia    a-fib   GERD (gastroesophageal reflux disease)    History of alcohol abuse    History of nuclear stress test    Myoview 10/16: EF 50%, diaphragmatic attenuation, no ischemia, low risk   Hypertension    Migraine 2012-2014   Moderate persistent asthma with acute exacerbation 05/02/2018   PAF (paroxysmal atrial fibrillation) ([place]) 03/27/2015   a. Myoview neg for ischemia >> Flecainide started 10/16 >> FU ETT    Schizophrenia ([place])    Sciatica neuralgia    Small vessel disease ([place])    Right basal ganglia stroke   Stroke [facility]) "between 2012-2014"   residual "AF" (09/22/2015)    MEDICATIONS AT HOME: Prior to Admission medications   Medication Sig Start Date End Date Taking? Authorizing Provider  albuterol (VENTOLIN HFA) 108 (90 Base) MCG/ACT inhaler Inhale 2 puffs into the lungs every 4 (four) hours as needed for wheezing or shortness of breath. 11/23/21  Yes Kozlow, Donnamarie Poag, MD  amiodarone (PACERONE) 200 MG tablet Take 2 tablets  (400 mg total) TWICE a day for 2 weeks, then take 1 tablet (200 mg total) TWICE a day for 2 weeks, then, take 1 tablet (200 mg total) ONCE a day 12/24/21  Yes Camnitz, Will Hassell Done, MD  amiodarone (PACERONE) 200 MG tablet Take 1 tablet (200 mg total) by mouth daily. 12/24/21  Yes Camnitz, Will Hassell Done, MD  apixaban (ELIQUIS) 5 MG TABS tablet TAKE 1 TABLET BY MOUTH TWICE A DAY 03/02/22  Yes Camnitz, Will Hassell Done, MD  azelastine (ASTELIN) 0.1 % nasal spray Place 2 sprays into both nostrils 2 (two) times daily. Use in each nostril as directed Patient taking differently: Place 1 spray into both nostrils 2 (two) times daily. Use in each nostril as directed 11/23/21  Yes Kozlow, Donnamarie Poag, MD  carvedilol (COREG) 25 MG tablet TAKE 1 TABLET BY MOUTH TWICE A DAY 06/08/21  Yes Camnitz, Ocie Doyne, MD  cetirizine (ZYRTEC) 10 MG tablet Take 1 tablet (10 mg total) by mouth 2 (two) times daily as needed for allergies (Can take an extra dose during flare ups.). 11/23/21  Yes Kozlow, Donnamarie Poag, MD  chlorpheniramine-HYDROcodone (TUSSIONEX) 10-8 MG/5ML Take 5 mLs by mouth every 12 (twelve) hours as needed for cough. 03/09/22  Yes Charlott Rakes, MD  Difluprednate 0.05 % EMUL Place 1 drop into both eyes 2 (two) times daily.   Yes [provider]  diltiazem (CARDIZEM CD) 180 MG 24 hr capsule TAKE 1 CAPSULE BY MOUTH EVERY DAY 01/31/22  Yes Camnitz, Will Hassell Done, MD  EPINEPHrine 0.3 mg/0.3 mL IJ SOAJ injection Inject 0.3 mg into the muscle as needed for anaphylaxis. 02/10/22  Yes Kozlow, Donnamarie Poag, MD  fluticasone (FLONASE) 50 MCG/ACT nasal spray Place 1 spray into both nostrils 2 (two) times daily. 11/23/21  Yes Kozlow, Donnamarie Poag, MD  fluticasone furoate-vilanterol (BREO ELLIPTA) 200-25 MCG/ACT AEPB Inhale 1 puff into the lungs daily. 11/23/21  Yes Kozlow, Donnamarie Poag, MD  folic acid (FOLVITE) 1 MG tablet Take 1 mg by mouth daily. 10/16/20  Yes [provider]  GARLIC PO Take 1 tablet by mouth daily.   Yes [provider]   hydrALAZINE (APRESOLINE) 100 MG tablet TAKE 1 TABLET (100 MG TOTAL) BY MOUTH 3 (THREE) TIMES DAILY. 03/20/20  Yes Camnitz, Ocie Doyne, MD  HYDROcodone-acetaminophen (NORCO) 5-325 MG tablet Take 1 tablet by mouth every 6 (six) hours as needed for moderate pain. 02/07/22  Yes Pete Pelt, PA-C  methotrexate (RHEUMATREX) 2.5 MG tablet Take 20 mg by mouth every Monday. 11/11/20  Yes [provider]  [place]. Devices MISC Back brace. Diagnosis chronic back pain 06/16/20  Yes Charlott Rakes, MD  Misc. Devices MISC Left knee brace. Diagnosis L knee pain 06/16/20  Yes Newlin, Enobong, MD  montelukast (SINGULAIR) 10 MG tablet Take 1 tablet (10 mg total) by mouth at bedtime. 11/23/21  Yes Kozlow, Donnamarie Poag, MD  Olopatadine HCl (PATADAY) 0.2 % SOLN Place 1 drop into both eyes daily as needed. 11/23/21  Yes Kozlow, Donnamarie Poag, MD  omeprazole (PRILOSEC) 20 MG capsule TAKE 1 CAPSULE BY MOUTH EVERY DAY 09/29/21  Yes Charlott Rakes, MD  predniSONE (DELTASONE) 20 MG tablet Take 1 tablet (20 mg total) by mouth daily with breakfast. 03/09/22  Yes Newlin, Enobong, MD  Vitamin D3 (VITAMIN D) 25 MCG tablet Take 1,000 Units by mouth daily.   Yes [provider]  atorvastatin (LIPITOR) 40 MG tablet Take 1 tablet (40 mg total) by mouth daily. 03/16/22   Shunda Rabadi, Dionne Bucy, PA-C    ROS: Neg HEENT Neg resp Neg cardiac Neg GI Neg GU Neg psych Neg neuro  Objective:   Vitals:   03/16/22 1002  BP: (!) 148/94  Pulse: 69  SpO2: 98%  Weight: 250 lb 3.2 oz (113.5 kg)  Height: '6\' 1"'$  (1.854 m)   Exam General appearance : Awake, alert, not in any distress. Speech Clear. Not toxic looking HEENT: Atraumatic and Normocephalic Neck: Supple, no JVD. No cervical lymphadenopathy.  Chest: Good air entry bilaterally, CTAB.  No rales/rhonchi/wheezing CVS: S1 S2 regular, no murmurs.  Extremities: B/L Lower Ext shows no edema, both legs are warm to touch.  R wrist-dorsum with 1cm typical ganglion cyst.   Neurology: Awake  alert, and oriented X 3, CN II-XII intact, Non focal Skin: No Rash  Data Review Lab Results  Component Value Date   HGBA1C 5.8 09/29/2021   HGBA1C 6.4 04/26/2021   HGBA1C 9.0 (A) 02/10/2021    Assessment & Plan   1. Ganglion cyst of dorsum of right wrist - Ambulatory referral to Hand Surgery  2. Type 2 diabetes mellitus with other specified complication, without long-term current use of insulin (HCC) - atorvastatin (LIPITOR) 40 MG tablet; Take 1 tablet (40 mg total) by mouth daily.  Dispense: 90  tablet; Refill: 1    Return in about 3 months (around 06/16/2022) for PCP for chronic conditions.  The patient was given clear instructions to go to ER or return to medical center if symptoms don't improve, worsen or new problems develop. The patient verbalized understanding. The patient was told to call to get lab results if they haven't heard anything in the next week.   Please RF other meds if needed   Freeman Caldron, PA-C [facility] and [facility], [place]   03/16/2022, 10:23 AM

## 2022-03-16 NOTE — Patient Instructions (Signed)
Ganglion Cyst  A ganglion cyst is a non-cancerous, fluid-filled lump of tissue that occurs near a joint, tendon, or ligament. The cyst grows out of a joint or the lining of a tendon or ligament. Ganglion cysts most often develop in the hand or wrist, but they can also develop in the shoulder, elbow, hip, knee, ankle, or foot. Ganglion cysts are ball-shaped or egg-shaped. Their size can range from the size of a pea to larger than a grape. Increased activity may cause the cyst to get bigger because more fluid starts to build up. What are the causes? The exact cause of this condition is not known, but it may be related to: Inflammation or irritation around the joint. An injury or tear in the layers of tissue around the joint (joint capsule). Repetitive movements or overuse. History of acute or repeated injury. What increases the risk? You are more likely to develop this condition if: You are a male. You are 74-36 years old. What are the signs or symptoms? The main symptom of this condition is a lump. It most often appears on the hand or wrist. In many cases, there are no other symptoms, but a cyst can sometimes cause: Tingling. Pain or tenderness. Numbness. Weakness or loss of strength in the affected joint. Decreased range of motion in the affected area of the body. How is this diagnosed? Ganglion cysts are usually diagnosed based on a physical exam. Your health care provider will feel the lump and may shine a light next to it. If it is a ganglion cyst, the light will likely shine through it. Your health care provider may order an X-ray, ultrasound, MRI, or CT scan to rule out other conditions. How is this treated? Ganglion cysts often go away on their own without treatment. If you have pain or other symptoms, treatment may be needed. Treatment is also needed if the ganglion cyst limits your movement or if it gets infected. Treatment may include: Wearing a brace or splint on your wrist or  finger. Taking anti-inflammatory medicine. Having fluid drained from the lump with a needle (aspiration). Getting an injection of medicine into the joint to decrease inflammation. This may be corticosteroids, ethanol, or hyaluronidase. Having surgery to remove the ganglion cyst. Placing a pad in your shoe or wearing shoes that will not rub against the cyst if it is on your foot. Follow these instructions at home: Do not press on the ganglion cyst, poke it with a needle, or hit it. Take over-the-counter and prescription medicines only as told by your health care provider. If you have a brace or splint: Wear it as told by your health care provider. Remove it as told by your health care provider. Ask if you need to remove it when you take a shower or a bath. Watch your ganglion cyst for any changes. Keep all follow-up visits as told by your health care provider. This is important. Contact a health care provider if: Your ganglion cyst becomes larger or more painful. You have pus coming from the lump. You have weakness or numbness in the affected area. You have a fever or chills. Get help right away if: You have a fever and have any of these in the cyst area: Increased redness. Red streaks. Swelling. Summary A ganglion cyst is a non-cancerous, fluid-filled lump that occurs near a joint, tendon, or ligament. Ganglion cysts most often develop in the hand or wrist, but they can also develop in the shoulder, elbow, hip, knee, ankle, or  foot. Ganglion cysts often go away on their own without treatment. This information is not intended to replace advice given to you by your health care provider. Make sure you discuss any questions you have with your health care provider. Document Revised: 09/25/2019 Document Reviewed: 09/25/2019 Elsevier Patient Education  Jason Stokes.

## 2022-03-17 DIAGNOSIS — I480 Paroxysmal atrial fibrillation: Secondary | ICD-10-CM | POA: Diagnosis not present

## 2022-03-18 DIAGNOSIS — I480 Paroxysmal atrial fibrillation: Secondary | ICD-10-CM | POA: Diagnosis not present

## 2022-03-19 DIAGNOSIS — I480 Paroxysmal atrial fibrillation: Secondary | ICD-10-CM | POA: Diagnosis not present

## 2022-03-20 DIAGNOSIS — I480 Paroxysmal atrial fibrillation: Secondary | ICD-10-CM | POA: Diagnosis not present

## 2022-03-21 DIAGNOSIS — I480 Paroxysmal atrial fibrillation: Secondary | ICD-10-CM | POA: Diagnosis not present

## 2022-03-22 DIAGNOSIS — I480 Paroxysmal atrial fibrillation: Secondary | ICD-10-CM | POA: Diagnosis not present

## 2022-03-23 DIAGNOSIS — I480 Paroxysmal atrial fibrillation: Secondary | ICD-10-CM | POA: Diagnosis not present

## 2022-03-24 DIAGNOSIS — H3023 Posterior cyclitis, bilateral: Secondary | ICD-10-CM | POA: Diagnosis not present

## 2022-03-24 DIAGNOSIS — I480 Paroxysmal atrial fibrillation: Secondary | ICD-10-CM | POA: Diagnosis not present

## 2022-03-25 ENCOUNTER — Ambulatory Visit (INDEPENDENT_AMBULATORY_CARE_PROVIDER_SITE_OTHER): Payer: Medicaid Other

## 2022-03-25 DIAGNOSIS — J309 Allergic rhinitis, unspecified: Secondary | ICD-10-CM | POA: Diagnosis not present

## 2022-03-25 DIAGNOSIS — I480 Paroxysmal atrial fibrillation: Secondary | ICD-10-CM | POA: Diagnosis not present

## 2022-03-26 DIAGNOSIS — I480 Paroxysmal atrial fibrillation: Secondary | ICD-10-CM | POA: Diagnosis not present

## 2022-03-27 DIAGNOSIS — I480 Paroxysmal atrial fibrillation: Secondary | ICD-10-CM | POA: Diagnosis not present

## 2022-03-28 DIAGNOSIS — H30033 Focal chorioretinal inflammation, peripheral, bilateral: Secondary | ICD-10-CM | POA: Diagnosis not present

## 2022-03-28 DIAGNOSIS — H4043X2 Glaucoma secondary to eye inflammation, bilateral, moderate stage: Secondary | ICD-10-CM | POA: Insufficient documentation

## 2022-03-28 DIAGNOSIS — I480 Paroxysmal atrial fibrillation: Secondary | ICD-10-CM | POA: Diagnosis not present

## 2022-03-28 DIAGNOSIS — H3581 Retinal edema: Secondary | ICD-10-CM | POA: Insufficient documentation

## 2022-03-28 DIAGNOSIS — Z961 Presence of intraocular lens: Secondary | ICD-10-CM | POA: Insufficient documentation

## 2022-03-29 DIAGNOSIS — I480 Paroxysmal atrial fibrillation: Secondary | ICD-10-CM | POA: Diagnosis not present

## 2022-03-30 ENCOUNTER — Ambulatory Visit (INDEPENDENT_AMBULATORY_CARE_PROVIDER_SITE_OTHER): Payer: Medicaid Other | Admitting: Orthopaedic Surgery

## 2022-03-30 DIAGNOSIS — M1711 Unilateral primary osteoarthritis, right knee: Secondary | ICD-10-CM

## 2022-03-30 DIAGNOSIS — G8929 Other chronic pain: Secondary | ICD-10-CM | POA: Diagnosis not present

## 2022-03-30 DIAGNOSIS — M1712 Unilateral primary osteoarthritis, left knee: Secondary | ICD-10-CM

## 2022-03-30 DIAGNOSIS — M25562 Pain in left knee: Secondary | ICD-10-CM | POA: Diagnosis not present

## 2022-03-30 DIAGNOSIS — M25531 Pain in right wrist: Secondary | ICD-10-CM | POA: Diagnosis not present

## 2022-03-30 DIAGNOSIS — M25561 Pain in right knee: Secondary | ICD-10-CM

## 2022-03-30 DIAGNOSIS — R2231 Localized swelling, mass and lump, right upper limb: Secondary | ICD-10-CM | POA: Insufficient documentation

## 2022-03-30 DIAGNOSIS — I480 Paroxysmal atrial fibrillation: Secondary | ICD-10-CM | POA: Diagnosis not present

## 2022-03-30 MED ORDER — LIDOCAINE HCL 1 % IJ SOLN
3.0000 mL | INTRAMUSCULAR | Status: AC | PRN
  Administered 2022-03-30: 3 mL

## 2022-03-30 MED ORDER — METHYLPREDNISOLONE ACETATE 40 MG/ML IJ SUSP
40.0000 mg | INTRAMUSCULAR | Status: AC | PRN
  Administered 2022-03-30: 40 mg via INTRA_ARTICULAR

## 2022-03-30 MED ORDER — HYDROCODONE-ACETAMINOPHEN 5-325 MG PO TABS: 1.0000 | ORAL_TABLET | Freq: Three times a day (TID) | ORAL | 0 refills | Status: DC | PRN

## 2022-03-30 NOTE — Progress Notes (Signed)
Office Visit Note   [name]           Date of Birth: 1961-06-27           MRN: [id number] Visit Date: 03/30/2022              Requested by: Charlott Rakes, MD [place],  [place] [address] PCP: Charlott Rakes, MD   Assessment & Plan: Visit Diagnoses:  1. Unilateral primary osteoarthritis, right knee   2. Unilateral primary osteoarthritis, left knee   3. Chronic pain of left knee   4. Chronic pain of right knee     Plan: I was able to aspirate 40 cc of clear fluid from the right knee consistent with osteoarthritis.  I placed a steroid injection in both knees today.  He knows to wait at least 3 months between steroid injections.  I will send in a few hydrocodone to use sparingly.  Follow-Up Instructions: No follow-ups on file.   Orders:  Orders Placed This Encounter  Procedures   Large Joint Inj   No orders of the defined types were placed in this encounter.     Procedures: Large Joint Inj: L knee on 03/30/2022 10:43 AM Indications: diagnostic evaluation and pain Details: 22 G 1.5 in needle, superolateral approach  Arthrogram: No  Medications: 3 mL lidocaine 1 %; 40 mg methylPREDNISolone acetate 40 MG/ML Outcome: tolerated well, no immediate complications Procedure, treatment alternatives, risks and benefits explained, specific risks discussed. Consent was given by the patient. Immediately prior to procedure a time out was called to verify the correct patient, procedure, equipment, support staff and site/side marked as required. Patient was prepped and draped in the usual sterile fashion.       Clinical Data: No additional findings.   Subjective: Chief Complaint  Patient presents with   Right Knee - Pain   Left Knee - Pain  The patient has known arthritis in both his knees.  The right knee is much worse.  We have had to aspirate an effusion off the right knee back in July and he needs this again today with a steroid  injection in both his knees.  [facility]  He is on Eliquis.  He has atrial fibrillation.  He has not had any type of ablation or intervention yet.  He understands that we would need cardiac clearance before proceeding with any type of knee replacement for his right knee.  HPI  Review of Systems He currently denies any headache, chest pain, shortness of breath, fever, chills, nausea, vomiting  Objective: Vital Signs: There were no vitals taken for this visit.  Physical Exam He is alert and orient x3 and in no acute distress Ortho Exam Examination of his right knee shows a moderate effusion.  Examination left knee shows no effusion.  Both knees have patellofemoral capitation with slight varus malalignment and pain throughout the arc of motion. Specialty Comments:  No specialty comments available.  Imaging: No results found.   PMFS History: Patient Active Problem List   Diagnosis Date Noted   Secondary hypercoagulable state ([place]) 03/23/2021   Type 2 diabetes mellitus with hyperglycemia, without long-term current use of insulin ([place]) 02/10/2021   Neck pain on left side 03/06/2019   Musculoskeletal chest pain 07/24/2018   Asthma, mild intermittent 05/02/2018   Allergic rhinitis caused by mold 05/02/2018   Tobacco use 05/02/2018   Bilateral carpal tunnel syndrome 01/10/2018  Stroke ([place])    Schizophrenia ([place])    Migraine    History of nuclear stress test    History of alcohol abuse    GERD (gastroesophageal reflux disease)    Dysrhythmia    Chronic lower back pain    Anxiety    Arthritis of knee, right 04/12/2017   Bipolar disorder ([place]) 04/03/2017   Lipoma of forehead 09/22/2016   Trigger ring finger of right hand 06/22/2016   Paroxysmal atrial fibrillation (HCC)    AF (atrial fibrillation) ([place]) 12/10/2015   Eczema 07/10/2015   History of CVA (cerebrovascular accident) 05/04/2015   Tear of medial meniscus of right knee  03/18/2015   Chronic pain of right knee 03/12/2015   Other and unspecified hyperlipidemia 11/25/2013   Nasal congestion 11/25/2013   Sinusitis, chronic 05/09/2013   Chronic low back pain 10/12/2012   Lumbar radiculopathy 10/12/2012   Hypertension 10/12/2012   Depression 10/12/2012   Insomnia 10/12/2012   Past Medical History:  Diagnosis Date   Anxiety    Bilateral carpal tunnel syndrome 01/10/2018   Bipolar disorder ([place])    Chronic lower back pain    Depression    Dysrhythmia    a-fib   GERD (gastroesophageal reflux disease)    History of alcohol abuse    History of nuclear stress test    Myoview 10/16: EF 50%, diaphragmatic attenuation, no ischemia, low risk   Hypertension    Migraine 2012-2014   Moderate persistent asthma with acute exacerbation 05/02/2018   PAF (paroxysmal atrial fibrillation) ([place]) 03/27/2015   a. Myoview neg for ischemia >> Flecainide started 10/16 >> FU ETT    Schizophrenia ([place])    Sciatica neuralgia    Small vessel disease (HCC)    Right basal ganglia stroke   Stroke [facility]) "between 2012-2014"   residual "AF" (09/22/2015)    Family History  Problem Relation Age of Onset   Heart disease Father    Schizophrenia Sister     Past Surgical History:  Procedure Laterality Date   ATRIAL FIBRILLATION ABLATION  09/22/2015   CYST EXCISION  1996-97   surgery back of head    ELECTROPHYSIOLOGIC STUDY N/A 09/22/2015   Procedure: Atrial Fibrillation Ablation;  Surgeon: Will [name], MD;  Location: [place] [facility];  Service: Cardiovascular;  Laterality: N/A;   ELECTROPHYSIOLOGIC STUDY N/A 12/10/2015   Procedure: Atrial Fibrillation Ablation;  Surgeon: Will [name], MD;  Location: [place] [facility];  Service: Cardiovascular;  Laterality: N/A;   ELECTROPHYSIOLOGIC STUDY N/A 12/11/2015   Procedure: Cardioversion;  Surgeon: Will [name], MD;  Location: [place] [facility];  Service: Cardiovascular;  Laterality: N/A;   EMBOLIZATION Right 08/19/2020    Procedure: EMBOLIZATION;  Surgeon: Cherre Robins, MD;  Location: [place] [facility];  Service: Cardiovascular;  Laterality: Right;  hypogastric   EXCISION MASS HEAD N/A 01/06/2017   Procedure: EXCISION MASS FOREHEAD;  Surgeon: Irene Limbo, MD;  Location: [place];  Service: Plastics;  Laterality: N/A;   GANGLION CYST EXCISION Left    INTERCOSTAL NERVE BLOCK  2005   KNEE ARTHROSCOPY Right 2016   MASS EXCISION N/A 01/25/2021   Procedure: EXCISION SUBCUTANEOUS VS SUBFASCIAL MASS TORSO 3CM;  Surgeon: Irene Limbo, MD;  Location: [place];  Service: Plastics;  Laterality: N/A;   Social History   Occupational History    Comment: disabled  Tobacco Use   Smoking status: Every Day    Years: 29.00    Types: Cigarettes  Smokeless tobacco: Never   Tobacco comments:    1 cigarettes every day  06-02-2021  Vaping Use   Vaping Use: Never used  Substance and Sexual Activity   Alcohol use: Yes    Alcohol/week: 2.0 standard drinks of alcohol    Types: 2 Standard drinks or equivalent per week    Comment: 83mxed drinks   Drug use: Yes    Types: Marijuana    Comment: smoked marijuana 01-02-17 for pain   Sexual activity: Not Currently

## 2022-03-31 ENCOUNTER — Ambulatory Visit (INDEPENDENT_AMBULATORY_CARE_PROVIDER_SITE_OTHER): Payer: Medicaid Other | Admitting: *Deleted

## 2022-03-31 DIAGNOSIS — I480 Paroxysmal atrial fibrillation: Secondary | ICD-10-CM | POA: Diagnosis not present

## 2022-03-31 DIAGNOSIS — J309 Allergic rhinitis, unspecified: Secondary | ICD-10-CM

## 2022-04-01 DIAGNOSIS — I480 Paroxysmal atrial fibrillation: Secondary | ICD-10-CM | POA: Diagnosis not present

## 2022-04-02 DIAGNOSIS — I480 Paroxysmal atrial fibrillation: Secondary | ICD-10-CM | POA: Diagnosis not present

## 2022-04-03 DIAGNOSIS — I480 Paroxysmal atrial fibrillation: Secondary | ICD-10-CM | POA: Diagnosis not present

## 2022-04-04 DIAGNOSIS — J3081 Allergic rhinitis due to animal (cat) (dog) hair and dander: Secondary | ICD-10-CM | POA: Diagnosis not present

## 2022-04-04 DIAGNOSIS — I480 Paroxysmal atrial fibrillation: Secondary | ICD-10-CM | POA: Diagnosis not present

## 2022-04-05 ENCOUNTER — Ambulatory Visit (INDEPENDENT_AMBULATORY_CARE_PROVIDER_SITE_OTHER): Payer: Medicaid Other

## 2022-04-05 DIAGNOSIS — I480 Paroxysmal atrial fibrillation: Secondary | ICD-10-CM | POA: Diagnosis not present

## 2022-04-05 DIAGNOSIS — J309 Allergic rhinitis, unspecified: Secondary | ICD-10-CM

## 2022-04-05 NOTE — Progress Notes (Signed)
VIALS EXP 04-06-23

## 2022-04-06 DIAGNOSIS — I480 Paroxysmal atrial fibrillation: Secondary | ICD-10-CM | POA: Diagnosis not present

## 2022-04-06 DIAGNOSIS — J3089 Other allergic rhinitis: Secondary | ICD-10-CM | POA: Diagnosis not present

## 2022-04-07 DIAGNOSIS — I480 Paroxysmal atrial fibrillation: Secondary | ICD-10-CM | POA: Diagnosis not present

## 2022-04-08 DIAGNOSIS — I480 Paroxysmal atrial fibrillation: Secondary | ICD-10-CM | POA: Diagnosis not present

## 2022-04-09 DIAGNOSIS — I480 Paroxysmal atrial fibrillation: Secondary | ICD-10-CM | POA: Diagnosis not present

## 2022-04-10 DIAGNOSIS — I480 Paroxysmal atrial fibrillation: Secondary | ICD-10-CM | POA: Diagnosis not present

## 2022-04-11 DIAGNOSIS — I480 Paroxysmal atrial fibrillation: Secondary | ICD-10-CM | POA: Diagnosis not present

## 2022-04-12 DIAGNOSIS — H30033 Focal chorioretinal inflammation, peripheral, bilateral: Secondary | ICD-10-CM | POA: Diagnosis not present

## 2022-04-12 DIAGNOSIS — I480 Paroxysmal atrial fibrillation: Secondary | ICD-10-CM | POA: Diagnosis not present

## 2022-04-12 DIAGNOSIS — Z961 Presence of intraocular lens: Secondary | ICD-10-CM | POA: Diagnosis not present

## 2022-04-12 DIAGNOSIS — H4043X2 Glaucoma secondary to eye inflammation, bilateral, moderate stage: Secondary | ICD-10-CM | POA: Diagnosis not present

## 2022-04-13 ENCOUNTER — Ambulatory Visit (INDEPENDENT_AMBULATORY_CARE_PROVIDER_SITE_OTHER): Payer: Medicaid Other | Admitting: *Deleted

## 2022-04-13 DIAGNOSIS — J309 Allergic rhinitis, unspecified: Secondary | ICD-10-CM | POA: Diagnosis not present

## 2022-04-13 DIAGNOSIS — I480 Paroxysmal atrial fibrillation: Secondary | ICD-10-CM | POA: Diagnosis not present

## 2022-04-14 DIAGNOSIS — I480 Paroxysmal atrial fibrillation: Secondary | ICD-10-CM | POA: Diagnosis not present

## 2022-04-15 ENCOUNTER — Ambulatory Visit: Payer: Medicaid Other | Admitting: Orthopedic Surgery

## 2022-04-15 ENCOUNTER — Ambulatory Visit: Payer: Medicaid Other | Attending: Cardiology

## 2022-04-15 DIAGNOSIS — D1721 Benign lipomatous neoplasm of skin and subcutaneous tissue of right arm: Secondary | ICD-10-CM | POA: Diagnosis not present

## 2022-04-15 DIAGNOSIS — I480 Paroxysmal atrial fibrillation: Secondary | ICD-10-CM

## 2022-04-15 DIAGNOSIS — Z01812 Encounter for preprocedural laboratory examination: Secondary | ICD-10-CM

## 2022-04-15 DIAGNOSIS — R2231 Localized swelling, mass and lump, right upper limb: Secondary | ICD-10-CM | POA: Diagnosis not present

## 2022-04-16 DIAGNOSIS — I480 Paroxysmal atrial fibrillation: Secondary | ICD-10-CM | POA: Diagnosis not present

## 2022-04-16 LAB — CBC
Hematocrit: 40.6 % (ref 37.5–51.0)
Hemoglobin: 12.8 g/dL — ABNORMAL LOW (ref 13.0–17.7)
MCH: 27 pg (ref 26.6–33.0)
MCHC: 31.5 g/dL (ref 31.5–35.7)
MCV: 86 fL (ref 79–97)
Platelets: 168 10*3/uL (ref 150–450)
RBC: 4.74 x10E6/uL (ref 4.14–5.80)
RDW: 16.4 % — ABNORMAL HIGH (ref 11.6–15.4)
WBC: 6.6 10*3/uL (ref 3.4–10.8)

## 2022-04-16 LAB — BASIC METABOLIC PANEL
BUN/Creatinine Ratio: 11 (ref 10–24)
BUN: 14 mg/dL (ref 8–27)
CO2: 24 mmol/L (ref 20–29)
Calcium: 9.6 mg/dL (ref 8.6–10.2)
Chloride: 103 mmol/L (ref 96–106)
Creatinine, Ser: 1.25 mg/dL (ref 0.76–1.27)
Glucose: 145 mg/dL — ABNORMAL HIGH (ref 70–99)
Potassium: 3.9 mmol/L (ref 3.5–5.2)
Sodium: 141 mmol/L (ref 134–144)
eGFR: 66 mL/min/{1.73_m2} (ref >59)

## 2022-04-17 DIAGNOSIS — I480 Paroxysmal atrial fibrillation: Secondary | ICD-10-CM | POA: Diagnosis not present

## 2022-04-18 DIAGNOSIS — I480 Paroxysmal atrial fibrillation: Secondary | ICD-10-CM | POA: Diagnosis not present

## 2022-04-19 DIAGNOSIS — I480 Paroxysmal atrial fibrillation: Secondary | ICD-10-CM | POA: Diagnosis not present

## 2022-04-20 ENCOUNTER — Telehealth (HOSPITAL_COMMUNITY): Payer: Self-pay | Admitting: *Deleted

## 2022-04-20 DIAGNOSIS — I480 Paroxysmal atrial fibrillation: Secondary | ICD-10-CM | POA: Diagnosis not present

## 2022-04-20 NOTE — Telephone Encounter (Signed)
Reaching out to patient to offer assistance regarding upcoming cardiac imaging study; pt verbalizes understanding of appt date/time, parking situation and where to check in, and verified current allergies; name and call back number provided for further questions should they arise  Gordy Clement RN Navigator Card[facility] [facility] [phone] office [phone] cell  Patient aware to arrive at [facility] at 9:15am

## 2022-04-21 ENCOUNTER — Ambulatory Visit (HOSPITAL_BASED_OUTPATIENT_CLINIC_OR_DEPARTMENT_OTHER)
Admission: RE | Admit: 2022-04-21 | Discharge: 2022-04-21 | Disposition: A | Discharge status: Home / Self Care | Payer: Medicaid Other | Source: Ambulatory Visit | Attending: Cardiology | Admitting: Cardiology

## 2022-04-21 ENCOUNTER — Ambulatory Visit (HOSPITAL_COMMUNITY): Admission: RE | Admit: 2022-04-21 | Payer: Medicaid Other | Source: Ambulatory Visit

## 2022-04-21 DIAGNOSIS — I480 Paroxysmal atrial fibrillation: Secondary | ICD-10-CM | POA: Diagnosis not present

## 2022-04-21 DIAGNOSIS — I4819 Other persistent atrial fibrillation: Secondary | ICD-10-CM | POA: Diagnosis not present

## 2022-04-21 MED ORDER — IOHEXOL 350 MG/ML SOLN
100.0000 mL | Freq: Once | INTRAVENOUS | Status: AC | PRN
  Administered 2022-04-21: 80 mL via INTRAVENOUS

## 2022-04-22 ENCOUNTER — Ambulatory Visit (INDEPENDENT_AMBULATORY_CARE_PROVIDER_SITE_OTHER): Payer: Medicaid Other

## 2022-04-22 DIAGNOSIS — J309 Allergic rhinitis, unspecified: Secondary | ICD-10-CM | POA: Diagnosis not present

## 2022-04-22 DIAGNOSIS — I480 Paroxysmal atrial fibrillation: Secondary | ICD-10-CM | POA: Diagnosis not present

## 2022-04-23 DIAGNOSIS — I480 Paroxysmal atrial fibrillation: Secondary | ICD-10-CM | POA: Diagnosis not present

## 2022-04-24 DIAGNOSIS — I480 Paroxysmal atrial fibrillation: Secondary | ICD-10-CM | POA: Diagnosis not present

## 2022-04-25 DIAGNOSIS — I480 Paroxysmal atrial fibrillation: Secondary | ICD-10-CM | POA: Diagnosis not present

## 2022-04-26 DIAGNOSIS — I480 Paroxysmal atrial fibrillation: Secondary | ICD-10-CM | POA: Diagnosis not present

## 2022-04-27 ENCOUNTER — Ambulatory Visit (INDEPENDENT_AMBULATORY_CARE_PROVIDER_SITE_OTHER): Payer: Medicaid Other | Admitting: Orthopedic Surgery

## 2022-04-27 ENCOUNTER — Ambulatory Visit (INDEPENDENT_AMBULATORY_CARE_PROVIDER_SITE_OTHER): Payer: Medicaid Other

## 2022-04-27 ENCOUNTER — Encounter: Payer: Self-pay | Admitting: Orthopedic Surgery

## 2022-04-27 DIAGNOSIS — I480 Paroxysmal atrial fibrillation: Secondary | ICD-10-CM | POA: Diagnosis not present

## 2022-04-27 DIAGNOSIS — M25512 Pain in left shoulder: Secondary | ICD-10-CM

## 2022-04-27 DIAGNOSIS — M542 Cervicalgia: Secondary | ICD-10-CM

## 2022-04-27 MED ORDER — METHYLPREDNISOLONE 4 MG PO TBPK: ORAL_TABLET | ORAL | 0 refills | Status: DC

## 2022-04-27 MED ORDER — METHOCARBAMOL 500 MG PO TABS: 500.0000 mg | ORAL_TABLET | Freq: Three times a day (TID) | ORAL | 0 refills | Status: DC | PRN

## 2022-04-27 NOTE — Pre-Procedure Instructions (Signed)
Instructed patient on the following items: Arrival time 0530 Nothing to eat or drink after midnight No meds AM of procedure Responsible person to drive you home and stay with you for 24 hrs  Have you missed any doses of anti-coagulant Eliquis- hasn't missed any doses    

## 2022-04-27 NOTE — Progress Notes (Signed)
Office Visit Note   [name]           Date of Birth: 1961-02-01           MRN: [id number] Visit Date: 04/27/2022 Requested by: Charlott Rakes, MD [place],   [id number] PCP: Charlott Rakes, MD  Subjective: Chief Complaint  Patient presents with   Left Shoulder - Pain    HPI: Jason Stokes is a 61 y.o. male who presents to the office reporting left shoulder pain.  He describes decreased range of motion.  Really does not report any injury but states that he woke up 3 weeks ago and he could not lift his arm.  The symptoms of started to get worse.  Reports numbness and tingling down the arm to the fingers.  Scheduled for total knee replacement in the spring.  He is on Eliquis for atrial fibrillation.  Hurts for him to lay on the left side at night..                ROS: All systems reviewed are negative as they relate to the chief complaint within the history of present illness.  Patient denies fevers or chills.  Assessment & Plan: Visit Diagnoses:  1. Left shoulder pain, unspecified chronicity     Plan: Impression is left shoulder pain with good rotator cuff strength but some loss of deltoid generated forward flexion and AB duction.  Does have paresthesias in the left arm.  This looks like radiculopathy and matches better his relatively acute history of symptom development.  Needs MRI scan of the cervical spine to evaluate left-sided radiculopathy.  Medrol Dosepak 6-day course.  Robaxin prescribed.  Norco one-time prescription only.  Follow-up after the MRI scan for further evaluation and management.  Structurally the shoulder looks pretty reasonable.  Follow-Up Instructions: No follow-ups on file.   Orders:  Orders Placed This Encounter  Procedures   XR Shoulder Left   No orders of the defined types were placed in this encounter.     Procedures: No procedures performed   Clinical Data: No additional  findings.  Objective: Vital Signs: There were no vitals taken for this visit.  Physical Exam:  Constitutional: Patient appears well-developed HEENT:  Head: Normocephalic Eyes:EOM are normal Neck: Normal range of motion Cardiovascular: Normal rate Pulmonary/chest: Effort normal Neurologic: Patient is alert Skin: Skin is warm Psychiatric: Patient has normal mood and affect  Ortho Exam: Ortho exam demonstrates passive range of motion of that left shoulder of 60/100/160.  He is got 5 out of 5 infraspinatus supraspinatus and subscap muscle testing strength on the left-hand side.  Forward flexion and AB duction both below 90 degrees.  Does have paresthesias in the C5-6 distribution on the left compared to the right.  No definite muscle atrophy.  Reflexes 0 to 1+ out of 4 bilateral biceps and triceps.  Radial pulse intact bilaterally.  Symptoms not necessarily improved with arm overhead.  Neck range of motion intact.  Specialty Comments:  No specialty comments available.  Imaging: No results found.   PMFS History: Patient Active Problem List   Diagnosis Date Noted   Secondary hypercoagulable state ([place]) 03/23/2021   Type 2 diabetes mellitus with hyperglycemia, without long-term current use of insulin ([place]) 02/10/2021   Neck pain on left side 03/06/2019   Musculoskeletal chest pain 07/24/2018   Asthma, mild intermittent 05/02/2018   Allergic rhinitis caused by mold 05/02/2018   Tobacco use 05/02/2018  Bilateral carpal tunnel syndrome 01/10/2018   Stroke ([place])    Schizophrenia ([place])    Migraine    History of nuclear stress test    History of alcohol abuse    GERD (gastroesophageal reflux disease)    Dysrhythmia    Chronic lower back pain    Anxiety    Arthritis of knee, right 04/12/2017   Bipolar disorder ([place]) 04/03/2017   Lipoma of forehead 09/22/2016   Trigger ring finger of right hand 06/22/2016   Paroxysmal atrial fibrillation (HCC)    AF (atrial fibrillation) ([place])  12/10/2015   Eczema 07/10/2015   History of CVA (cerebrovascular accident) 05/04/2015   Tear of medial meniscus of right knee 03/18/2015   Chronic pain of right knee 03/12/2015   Other and unspecified hyperlipidemia 11/25/2013   Nasal congestion 11/25/2013   Sinusitis, chronic 05/09/2013   Chronic low back pain 10/12/2012   Lumbar radiculopathy 10/12/2012   Hypertension 10/12/2012   Depression 10/12/2012   Insomnia 10/12/2012   Past Medical History:  Diagnosis Date   Anxiety    Bilateral carpal tunnel syndrome 01/10/2018   Bipolar disorder ([place])    Chronic lower back pain    Depression    Dysrhythmia    a-fib   GERD (gastroesophageal reflux disease)    History of alcohol abuse    History of nuclear stress test    Myoview 10/16: EF 50%, diaphragmatic attenuation, no ischemia, low risk   Hypertension    Migraine 2012-2014   Moderate persistent asthma with acute exacerbation 05/02/2018   PAF (paroxysmal atrial fibrillation) ([place]) 03/27/2015   a. Myoview neg for ischemia >> Flecainide started 10/16 >> FU ETT    Schizophrenia ([place])    Sciatica neuralgia    Small vessel disease (HCC)    Right basal ganglia stroke   Stroke [facility]) "between 2012-2014"   residual "AF" (09/22/2015)    Family History  Problem Relation Age of Onset   Heart disease Father    Schizophrenia Sister     Past Surgical History:  Procedure Laterality Date   ATRIAL FIBRILLATION ABLATION  09/22/2015   CYST EXCISION  1996-97   surgery back of head    ELECTROPHYSIOLOGIC STUDY N/A 09/22/2015   Procedure: Atrial Fibrillation Ablation;  Surgeon: Will [name], MD;  Location: [facility];  Service: Cardiovascular;  Laterality: N/A;   ELECTROPHYSIOLOGIC STUDY N/A 12/10/2015   Procedure: Atrial Fibrillation Ablation;  Surgeon: Will [name], MD;  Location: [facility];  Service: Cardiovascular;  Laterality: N/A;   ELECTROPHYSIOLOGIC STUDY N/A 12/11/2015   Procedure: Cardioversion;  Surgeon:  Will [name], MD;  Location: [facility];  Service: Cardiovascular;  Laterality: N/A;   EMBOLIZATION Right 08/19/2020   Procedure: EMBOLIZATION;  Surgeon: Cherre Robins, MD;  Location: [facility];  Service: Cardiovascular;  Laterality: Right;  hypogastric   EXCISION MASS HEAD N/A 01/06/2017   Procedure: EXCISION MASS FOREHEAD;  Surgeon: Irene Limbo, MD;  Location: [facility];  Service: Plastics;  Laterality: N/A;   GANGLION CYST EXCISION Left    INTERCOSTAL NERVE BLOCK  2005   KNEE ARTHROSCOPY Right 2016   MASS EXCISION N/A 01/25/2021   Procedure: EXCISION SUBCUTANEOUS VS SUBFASCIAL MASS TORSO 3CM;  Surgeon: Irene Limbo, MD;  Location: [facility];  Service: Plastics;  Laterality: N/A;   Social History   Occupational History    Comment: disabled  Tobacco Use   Smoking status: Every Day    Years:  29.00    Types: Cigarettes   Smokeless tobacco: Never   Tobacco comments:    1 cigarettes every day  06-02-2021  Vaping Use   Vaping Use: Never used  Substance and Sexual Activity   Alcohol use: Yes    Alcohol/week: 2.0 standard drinks of alcohol    Types: 2 Standard drinks or equivalent per week    Comment: 37mxed drinks   Drug use: Yes    Types: Marijuana    Comment: smoked marijuana 01-02-17 for pain   Sexual activity: Not Currently

## 2022-04-27 NOTE — Anesthesia Preprocedure Evaluation (Addendum)
Anesthesia Evaluation  Patient identified by MRN, date of birth, ID band Patient awake    Reviewed: Allergy & Precautions, NPO status , Patient's Chart, lab work & pertinent test results  History of Anesthesia Complications Negative for: history of anesthetic complications  Airway Mallampati: III  TM Distance: >3 FB Neck ROM: Full    Dental no notable dental hx. (+) Dental Advisory Given   Pulmonary asthma , Current Smoker and Patient abstained from smoking.,    Pulmonary exam normal        Cardiovascular hypertension, Pt. on medications and Pt. on home beta blockers Normal cardiovascular exam+ dysrhythmias Atrial Fibrillation      Neuro/Psych Anxiety Depression Bipolar Disorder Schizophrenia CVA    GI/Hepatic Neg liver ROS, GERD  ,  Endo/Other  negative endocrine ROS  Renal/GU negative Renal ROS  negative genitourinary   Musculoskeletal  (+) Arthritis ,   Abdominal   Peds  Hematology negative hematology ROS (+) Eliquis   Anesthesia Other Findings  Echo 01/13/21: EF 60-65%, normal wall motion, mod LVH, g1dd, normal RV function, trivial MR, valves otherwise unremarkable, borderline aortic root dilatation (38 mm) and mild ascending aortic dilatation (39 mm).  Reproductive/Obstetrics                            Anesthesia Physical  Anesthesia Plan  ASA: 3  Anesthesia Plan: General   Post-op Pain Management: Minimal or no pain anticipated   Induction: Intravenous  PONV Risk Score and Plan: 1 and Ondansetron, Dexamethasone, Midazolam and Treatment may vary due to age or medical condition  Airway Management Planned: Oral ETT  Additional Equipment:   Intra-op Plan:   Post-operative Plan: Extubation in OR  Informed Consent: I have reviewed the patients History and Physical, chart, labs and discussed the procedure including the risks, benefits and alternatives for the proposed anesthesia  with the patient or authorized representative who has indicated his/her understanding and acceptance.     Dental advisory given  Plan Discussed with: CRNA and Anesthesiologist  Anesthesia Plan Comments:        Anesthesia Quick Evaluation

## 2022-04-28 ENCOUNTER — Ambulatory Visit (HOSPITAL_COMMUNITY): Payer: Medicaid Other | Admitting: Certified Registered"

## 2022-04-28 ENCOUNTER — Other Ambulatory Visit: Payer: Self-pay

## 2022-04-28 ENCOUNTER — Ambulatory Visit (HOSPITAL_COMMUNITY)
Admission: RE | Admit: 2022-04-28 | Discharge: 2022-04-28 | Disposition: A | Discharge status: Home / Self Care | Payer: Medicaid Other | Attending: Cardiology | Admitting: Cardiology

## 2022-04-28 ENCOUNTER — Encounter (HOSPITAL_COMMUNITY)
Admission: RE | Disposition: A | Discharge status: Home / Self Care | Payer: Self-pay | Source: Home / Self Care | Attending: Cardiology

## 2022-04-28 ENCOUNTER — Ambulatory Visit (HOSPITAL_BASED_OUTPATIENT_CLINIC_OR_DEPARTMENT_OTHER): Payer: Medicaid Other | Admitting: Certified Registered"

## 2022-04-28 DIAGNOSIS — Z8673 Personal history of transient ischemic attack (TIA), and cerebral infarction without residual deficits: Secondary | ICD-10-CM | POA: Insufficient documentation

## 2022-04-28 DIAGNOSIS — I4891 Unspecified atrial fibrillation: Secondary | ICD-10-CM | POA: Diagnosis not present

## 2022-04-28 DIAGNOSIS — F17210 Nicotine dependence, cigarettes, uncomplicated: Secondary | ICD-10-CM | POA: Diagnosis not present

## 2022-04-28 DIAGNOSIS — J45909 Unspecified asthma, uncomplicated: Secondary | ICD-10-CM

## 2022-04-28 DIAGNOSIS — I4819 Other persistent atrial fibrillation: Secondary | ICD-10-CM | POA: Diagnosis not present

## 2022-04-28 DIAGNOSIS — F1290 Cannabis use, unspecified, uncomplicated: Secondary | ICD-10-CM | POA: Insufficient documentation

## 2022-04-28 DIAGNOSIS — I480 Paroxysmal atrial fibrillation: Secondary | ICD-10-CM | POA: Diagnosis not present

## 2022-04-28 DIAGNOSIS — I10 Essential (primary) hypertension: Secondary | ICD-10-CM | POA: Insufficient documentation

## 2022-04-28 DIAGNOSIS — F17200 Nicotine dependence, unspecified, uncomplicated: Secondary | ICD-10-CM | POA: Diagnosis not present

## 2022-04-28 HISTORY — PX: ATRIAL FIBRILLATION ABLATION: EP1191

## 2022-04-28 LAB — POCT ACTIVATED CLOTTING TIME
Activated Clotting Time: 233 seconds
Activated Clotting Time: 281 seconds

## 2022-04-28 SURGERY — ATRIAL FIBRILLATION ABLATION
Anesthesia: General

## 2022-04-28 MED ORDER — SODIUM CHLORIDE 0.9 % IV SOLN: INTRAVENOUS | Status: DC

## 2022-04-28 MED ORDER — TRAMADOL HCL 50 MG PO TABS
50.0000 mg | ORAL_TABLET | Freq: Once | ORAL | Status: AC
  Administered 2022-04-28: 50 mg via ORAL

## 2022-04-28 MED ORDER — SODIUM CHLORIDE 0.9 % IV SOLN: 250.0000 mL | INTRAVENOUS | Status: DC | PRN

## 2022-04-28 MED ORDER — MIDAZOLAM HCL 2 MG/2ML IJ SOLN
INTRAMUSCULAR | Status: DC | PRN
  Administered 2022-04-28: 2 mg via INTRAVENOUS

## 2022-04-28 MED ORDER — PROPOFOL 10 MG/ML IV BOLUS
INTRAVENOUS | Status: DC | PRN
  Administered 2022-04-28: 50 mg via INTRAVENOUS
  Administered 2022-04-28: 150 mg via INTRAVENOUS

## 2022-04-28 MED ORDER — DOBUTAMINE INFUSION FOR EP/ECHO/NUC (1000 MCG/ML)
INTRAVENOUS | Status: DC | PRN
  Administered 2022-04-28: 20 ug/kg/min via INTRAVENOUS

## 2022-04-28 MED ORDER — TRAMADOL HCL 50 MG PO TABS
ORAL_TABLET | ORAL | Status: AC
  Filled 2022-04-28: qty 1

## 2022-04-28 MED ORDER — ACETAMINOPHEN 325 MG PO TABS: 650.0000 mg | ORAL_TABLET | ORAL | Status: DC | PRN

## 2022-04-28 MED ORDER — CELECOXIB 200 MG PO CAPS
200.0000 mg | ORAL_CAPSULE | Freq: Once | ORAL | Status: AC
  Administered 2022-04-28: 200 mg via ORAL
  Filled 2022-04-28: qty 1

## 2022-04-28 MED ORDER — ONDANSETRON HCL 4 MG/2ML IJ SOLN
INTRAMUSCULAR | Status: DC | PRN
  Administered 2022-04-28: 4 mg via INTRAVENOUS

## 2022-04-28 MED ORDER — SUGAMMADEX SODIUM 200 MG/2ML IV SOLN
INTRAVENOUS | Status: DC | PRN
  Administered 2022-04-28: 200 mg via INTRAVENOUS

## 2022-04-28 MED ORDER — DEXAMETHASONE SODIUM PHOSPHATE 10 MG/ML IJ SOLN
INTRAMUSCULAR | Status: DC | PRN
  Administered 2022-04-28: 4 mg via INTRAVENOUS

## 2022-04-28 MED ORDER — FENTANYL CITRATE (PF) 250 MCG/5ML IJ SOLN
INTRAMUSCULAR | Status: DC | PRN
  Administered 2022-04-28: 100 ug via INTRAVENOUS

## 2022-04-28 MED ORDER — HEPARIN SODIUM (PORCINE) 1000 UNIT/ML IJ SOLN
INTRAMUSCULAR | Status: DC | PRN
  Administered 2022-04-28: 1000 [IU] via INTRAVENOUS

## 2022-04-28 MED ORDER — DOBUTAMINE INFUSION FOR EP/ECHO/NUC (1000 MCG/ML)
INTRAVENOUS | Status: AC
  Filled 2022-04-28: qty 250

## 2022-04-28 MED ORDER — ONDANSETRON HCL 4 MG/2ML IJ SOLN: 4.0000 mg | Freq: Four times a day (QID) | INTRAMUSCULAR | Status: DC | PRN

## 2022-04-28 MED ORDER — PROTAMINE SULFATE 10 MG/ML IV SOLN
INTRAVENOUS | Status: DC | PRN
  Administered 2022-04-28: 40 mg via INTRAVENOUS

## 2022-04-28 MED ORDER — HEPARIN SODIUM (PORCINE) 1000 UNIT/ML IJ SOLN
INTRAMUSCULAR | Status: AC
  Filled 2022-04-28: qty 10

## 2022-04-28 MED ORDER — LIDOCAINE 2% (20 MG/ML) 5 ML SYRINGE
INTRAMUSCULAR | Status: DC | PRN
  Administered 2022-04-28: 100 mg via INTRAVENOUS

## 2022-04-28 MED ORDER — ROCURONIUM BROMIDE 10 MG/ML (PF) SYRINGE
PREFILLED_SYRINGE | INTRAVENOUS | Status: DC | PRN
  Administered 2022-04-28: 100 mg via INTRAVENOUS

## 2022-04-28 MED ORDER — HEPARIN SODIUM (PORCINE) 1000 UNIT/ML IJ SOLN
INTRAMUSCULAR | Status: DC | PRN
  Administered 2022-04-28: 14000 [IU] via INTRAVENOUS
  Administered 2022-04-28: 8000 [IU] via INTRAVENOUS
  Administered 2022-04-28: 6000 [IU] via INTRAVENOUS

## 2022-04-28 MED ORDER — SODIUM CHLORIDE 0.9% FLUSH: 3.0000 mL | INTRAVENOUS | Status: DC | PRN

## 2022-04-28 MED ORDER — HEPARIN (PORCINE) IN NACL 1000-0.9 UT/500ML-% IV SOLN
INTRAVENOUS | Status: DC | PRN
  Administered 2022-04-28 (×4): 500 mL

## 2022-04-28 MED ORDER — HYDROCODONE-ACETAMINOPHEN 5-325 MG PO TABS: 1.0000 | ORAL_TABLET | Freq: Four times a day (QID) | ORAL | 0 refills | Status: DC | PRN

## 2022-04-28 SURGICAL SUPPLY — 20 items
CATH 8FR REPROCESSED SOUNDSTAR (CATHETERS) ×1 IMPLANT
CATH 8FR SOUNDSTAR REPROCESSED (CATHETERS) IMPLANT
CATH ABLAT QDOT MICRO BI TC DF (CATHETERS) IMPLANT
CATH OCTARAY 2.0 F 3-3-3-3-3 (CATHETERS) IMPLANT
CATH PIGTAIL STEERABLE D1 8.7 (WIRE) IMPLANT
CATH S-M CIRCA TEMP PROBE (CATHETERS) IMPLANT
CATH WEB BI DIR CSDF CRV REPRO (CATHETERS) IMPLANT
CLOSURE PERCLOSE PROSTYLE (VASCULAR PRODUCTS) IMPLANT
COVER SWIFTLINK CONNECTOR (BAG) ×1 IMPLANT
MAT PREVALON FULL STRYKER (MISCELLANEOUS) IMPLANT
PACK EP LATEX FREE (CUSTOM PROCEDURE TRAY) ×1
PACK EP LF (CUSTOM PROCEDURE TRAY) ×1 IMPLANT
PAD DEFIB RADIO PHYSIO CONN (PAD) ×1 IMPLANT
PATCH CARTO3 (PAD) IMPLANT
SHEATH CARTO VIZIGO SM CVD (SHEATH) IMPLANT
SHEATH PINNACLE 7F 10CM (SHEATH) IMPLANT
SHEATH PINNACLE 8F 10CM (SHEATH) IMPLANT
SHEATH PINNACLE 9F 10CM (SHEATH) IMPLANT
SHEATH PROBE COVER 6X72 (BAG) IMPLANT
TUBING SMART ABLATE COOLFLOW (TUBING) IMPLANT

## 2022-04-28 NOTE — Transfer of Care (Signed)
Immediate Anesthesia Transfer of Care Note  [name]  Procedure(s) Performed: ATRIAL FIBRILLATION ABLATION  Patient Location: Cath Lab  Anesthesia Type:General  Level of Consciousness: awake, alert , and oriented  Airway & Oxygen Therapy: Patient Spontanous Breathing and Patient connected to nasal cannula oxygen  Post-op Assessment: Report given to RN and Post -op Vital signs reviewed and stable  Post vital signs: Reviewed and stable  Last Vitals:  Vitals Value Taken Time  BP 165/95 04/28/22 0957  Temp 36.4 C 04/28/22 0953  Pulse 77 04/28/22 0958  Resp 16 04/28/22 0958  SpO2 100 % 04/28/22 0958  Vitals shown include unvalidated device data.  Last Pain:  Vitals:   04/28/22 0953  TempSrc: Temporal  PainSc: Asleep         Complications: There were no known notable events for this encounter.

## 2022-04-28 NOTE — Anesthesia Procedure Notes (Signed)
Procedure Name: Intubation Date/Time: 04/28/2022 7:46 AM  Performed by: Anastasio Auerbach, CRNAPre-anesthesia Checklist: Patient identified, Emergency Drugs available, Suction available and Patient being monitored Patient Re-evaluated:Patient Re-evaluated prior to induction Oxygen Delivery Method: Circle system utilized Preoxygenation: Pre-oxygenation with 100% oxygen Induction Type: IV induction Ventilation: Mask ventilation without difficulty Laryngoscope Size: Mac and 4 Grade View: Grade II Tube type: Oral Tube size: 8.0 mm Number of attempts: 1 Airway Equipment and Method: Stylet and Oral airway Placement Confirmation: ETT inserted through vocal cords under direct vision, positive ETCO2 and breath sounds checked- equal and bilateral Secured at: 24 cm Tube secured with: Tape Dental Injury: Teeth and Oropharynx as per pre-operative assessment

## 2022-04-28 NOTE — Anesthesia Postprocedure Evaluation (Signed)
Anesthesia Post Note  [name]  Procedure(s) Performed: ATRIAL FIBRILLATION ABLATION     Patient location during evaluation: PACU Anesthesia Type: General Level of consciousness: sedated Pain management: pain level controlled Vital Signs Assessment: post-procedure vital signs reviewed and stable Respiratory status: spontaneous breathing and respiratory function stable Cardiovascular status: stable Postop Assessment: no apparent nausea or vomiting Anesthetic complications: no   There were no known notable events for this encounter.  Last Vitals:  Vitals:   04/28/22 1115 04/28/22 1130  BP: (!) 144/99 (!) 149/95  Pulse: 69 75  Resp: (!) 22 (!) 24  Temp:    SpO2: 100% 100%    Last Pain:  Vitals:   04/28/22 1041  TempSrc:   PainSc: 0-No pain                 Mosetta Ferdinand DANIEL

## 2022-04-28 NOTE — Discharge Instructions (Addendum)
Post procedure care instructions No driving for 4 days. No lifting over 5 lbs for 1 week. No vigorous or sexual activity for 1 week. You may return to work/your usual activities on 05/06/22. Keep procedure site clean & dry. If you notice increased pain, swelling, bleeding or pus, call/return!  You may shower after 24 hours, but no soaking in baths/hot tubs/pools for 1 week.    You have an appointment set up with the [facility].  Multiple studies have shown that being followed by a dedicated atrial fibrillation clinic in addition to the standard care you receive from your other physicians improves health. We believe that enrollment in the atrial fibrillation clinic will allow [place] to better care for you.   The phone number to the [facility] is [phone]. The clinic is staffed Monday through Friday from 8:30am to 5pm.  Directions: The clinic is located in the [facility] [place] the hospital at the MAIN ENTRANCE "A", use [address] to the 6th floor.  Registration desk to the right of elevators on 6th floor  If you have any trouble locating the clinic, please don't hesitate to call [phone]. Cardiac Ablation, Care After  This sheet gives you information about how to care for yourself after your procedure. Your health care provider may also give you more specific instructions. If you have problems or questions, contact your health care provider. What can I expect after the procedure? After the procedure, it is common to have: Bruising around your puncture site. Tenderness around your puncture site. Skipped heartbeats. Tiredness (fatigue).  Follow these instructions at home: Puncture site care  Follow instructions from your health care provider about how to take care of your puncture site. Make sure you: If present, leave stitches (sutures), skin glue, or adhesive strips in place. These skin closures may need to stay in place for up  to 2 weeks. If adhesive strip edges start to loosen and curl up, you may trim the loose edges. Do not remove adhesive strips completely unless your health care provider tells you to do that. If a large square bandage is present, this may be removed 24 hours after surgery.  Check your puncture site every day for signs of infection. Check for: Redness, swelling, or pain. Fluid or blood. If your puncture site starts to bleed, lie down on your back, apply firm pressure to the area, and contact your health care provider. Warmth. Pus or a bad smell. A pea or small marble sized lump at the site is normal and can take up to three months to resolve.  Driving Do not drive for at least 4 days after your procedure or however long your health care provider recommends. (Do not resume driving if you have previously been instructed not to drive for other health reasons.) Do not drive or use heavy machinery while taking prescription pain medicine. Activity Avoid activities that take a lot of effort for at least 7 days after your procedure. Do not lift anything that is heavier than 5 lb (4.5 kg) for one week.  No sexual activity for 1 week.  Return to your normal activities as told by your health care provider. Ask your health care provider what activities are safe for you. General instructions Take over-the-counter and prescription medicines only as told by your health care provider. Do not use any products that contain nicotine or tobacco, such as cigarettes and e-cigarettes. If you need help quitting, ask your health care provider.  You may shower after 24 hours, but Do not take baths, swim, or use a hot tub for 1 week.  Do not drink alcohol for 24 hours after your procedure. Keep all follow-up visits as told by your health care provider. This is important. Contact a health care provider if: You have redness, mild swelling, or pain around your puncture site. You have fluid or blood coming from your puncture  site that stops after applying firm pressure to the area. Your puncture site feels warm to the touch. You have pus or a bad smell coming from your puncture site. You have a fever. You have chest pain or discomfort that spreads to your neck, jaw, or arm. You are sweating a lot. You feel nauseous. You have a fast or irregular heartbeat. You have shortness of breath. You are dizzy or light-headed and feel the need to lie down. You have pain or numbness in the arm or leg closest to your puncture site. Get help right away if: Your puncture site suddenly swells. Your puncture site is bleeding and the bleeding does not stop after applying firm pressure to the area. These symptoms may represent a serious problem that is an emergency. Do not wait to see if the symptoms will go away. Get medical help right away. Call your local emergency services (911 in the [place] Do not drive yourself to the hospital. Summary After the procedure, it is normal to have bruising and tenderness at the puncture site in your groin, neck, or forearm. Check your puncture site every day for signs of infection. Get help right away if your puncture site is bleeding and the bleeding does not stop after applying firm pressure to the area. This is a medical emergency. This information is not intended to replace advice given to you by your health care provider. Make sure you discuss any questions you have with your health care provider.

## 2022-04-28 NOTE — H&P (Signed)
Electrophysiology Office Note   Date:  04/28/2022   ID:  Jason Stokes, DOB [birth year], MRN [id number]  PCP:  Charlott Rakes, MD  Cardiologist:  [name] Primary Electrophysiologist: Matvey Llanas [name], MD    No chief complaint on file.     History of Present Illness: Jason Stokes is a 61 y.o. male who presents today for electrophysiology evaluation.     He has a history significant for atrial fibrillation, hypertension, remote CVA.  He is post atrial fibrillation ablation 12/10/2015.  He is currently on Multaq.  He has had no episodes of atrial fibrillation since his ablation.  Today, denies symptoms of palpitations, chest pain, shortness of breath, orthopnea, PND, lower extremity edema, claudication, dizziness, presyncope, syncope, bleeding, or neurologic sequela. The patient is tolerating medications without difficulties. Plan AF ablation today.    Past Medical History:  Diagnosis Date   Anxiety    Bilateral carpal tunnel syndrome 01/10/2018   Bipolar disorder (HCC)    Chronic lower back pain    Depression    Dysrhythmia    a-fib   GERD (gastroesophageal reflux disease)    History of alcohol abuse    History of nuclear stress test    Myoview 10/16: EF 50%, diaphragmatic attenuation, no ischemia, low risk   Hypertension    Migraine 2012-2014   Moderate persistent asthma with acute exacerbation 05/02/2018   PAF (paroxysmal atrial fibrillation) ([place]) 03/27/2015   a. Myoview neg for ischemia >> Flecainide started 10/16 >> FU ETT    Schizophrenia ([place])    Sciatica neuralgia    Small vessel disease ([place])    Right basal ganglia stroke   Stroke [facility]) "between 2012-2014"   residual "AF" (09/22/2015)   Past Surgical History:  Procedure Laterality Date   ATRIAL FIBRILLATION ABLATION  09/22/2015   CYST EXCISION  1996-97   surgery back of head    ELECTROPHYSIOLOGIC STUDY N/A 09/22/2015   Procedure: Atrial Fibrillation Ablation;  Surgeon: Meeah Totino [name], MD;   Location: [facility];  Service: Cardiovascular;  Laterality: N/A;   ELECTROPHYSIOLOGIC STUDY N/A 12/10/2015   Procedure: Atrial Fibrillation Ablation;  Surgeon: Felcia Huebert [name], MD;  Location: [facility];  Service: Cardiovascular;  Laterality: N/A;   ELECTROPHYSIOLOGIC STUDY N/A 12/11/2015   Procedure: Cardioversion;  Surgeon: Mohammedali Bedoy [name], MD;  Location: [facility];  Service: Cardiovascular;  Laterality: N/A;   EMBOLIZATION Right 08/19/2020   Procedure: EMBOLIZATION;  Surgeon: Cherre Robins, MD;  Location: [facility];  Service: Cardiovascular;  Laterality: Right;  hypogastric   EXCISION MASS HEAD N/A 01/06/2017   Procedure: EXCISION MASS FOREHEAD;  Surgeon: Irene Limbo, MD;  Location: [facility];  Service: Plastics;  Laterality: N/A;   GANGLION CYST EXCISION Left    INTERCOSTAL NERVE BLOCK  2005   KNEE ARTHROSCOPY Right 2016   MASS EXCISION N/A 01/25/2021   Procedure: EXCISION SUBCUTANEOUS VS SUBFASCIAL MASS TORSO 3CM;  Surgeon: Irene Limbo, MD;  Location: [facility];  Service: Plastics;  Laterality: N/A;     Current Facility-Administered Medications  Medication Dose Route Frequency Provider Last Rate Last Admin   0.9 %  sodium chloride infusion   Intravenous Continuous Constance Haw, MD 50 mL/hr at 04/28/22 0601 New Bag at 04/28/22 0601    Allergies:   Lisinopril   Social History:  The patient  reports that he has been smoking cigarettes. He has never used smokeless tobacco. He reports current alcohol  use of about 2.0 standard drinks of alcohol per week. He reports current drug use. Drug: Marijuana.   Family History:  The patient's family history includes Heart disease in his father; Schizophrenia in his sister.   ROS:  Please see the history of present illness.   Otherwise, review of systems is positive for none.   All other systems are reviewed and negative.   PHYSICAL EXAM: VS:  BP (!) 174/99    Pulse 65   Temp 98 F (36.7 C) (Temporal)   Resp 17   Ht '6\' 1"'$  (1.854 m)   Wt 116.1 kg   SpO2 100%   BMI 33.78 kg/m  , BMI Body mass index is 33.78 kg/m. GEN: Well nourished, well developed, in no acute distress  HEENT: normal  Neck: no JVD, carotid bruits, or masses Cardiac: irregular; no murmurs, rubs, or gallops,no edema  Respiratory:  clear to auscultation bilaterally, normal work of breathing GI: soft, nontender, nondistended, + BS MS: no deformity or atrophy  Skin: warm and dry Neuro:  Strength and sensation are intact Psych: euthymic mood, full affect  Recent Labs: 09/29/2021: ALT 17 04/15/2022: BUN 14; Creatinine, Ser 1.25; Hemoglobin 12.8; Platelets 168; Potassium 3.9; Sodium 141    Lipid Panel     Component Value Date/Time   CHOL 171 05/07/2018 1004   TRIG 204 (H) 05/07/2018 1004   HDL 33 (L) 05/07/2018 1004   CHOLHDL 5.2 (H) 05/07/2018 1004   CHOLHDL 5.4 10/17/2013 1042   VLDL 36 10/17/2013 1042   LDLCALC 97 05/07/2018 1004     Wt Readings from Last 3 Encounters:  04/28/22 116.1 kg  03/16/22 113.5 kg  01/13/22 118.1 kg     Other studies Reviewed: Additional studies/ records that were reviewed today include:  SPECT 05/15/15, TTE 03/31/15 Review of the above records today demonstrates:  SPECT The left ventricular ejection fraction is mildly decreased (45-54%). Nuclear stress EF: 50%. There was no ST segment deviation noted during stress. Defect 1: Moderate sized, mild in intensity, fixed defect in the mid and basal inferior and basal inferolateral wall consistent with diaphragmatic attenuation. No ischemia noted. This is a low risk study.  TTE - Normal LV systolic function; grade 1 diastolic dysfunction; mild LAE.  ETT 01/26/17 - personally reviewed Blood pressure demonstrated a normal response to exercise. There was no ST segment deviation noted during stress. Negative study for exercise induced ischemia. There were frequent PVCs, ventricular  couplets and ventricular salvos.   ASSESSMENT AND PLAN:  1.  Paroxysmal atrial fibrillation: Jason Stokes has presented today for surgery, with the diagnosis of AF.  The various methods of treatment have been discussed with the patient and family. After consideration of risks, benefits and other options for treatment, the patient has consented to  Procedure(s): Catheter ablation as a surgical intervention .  Risks include but not limited to complete heart block, stroke, esophageal damage, nerve damage, bleeding, vascular damage, tamponade, perforation, MI, and death. The patient's history has been reviewed, patient examined, no change in status, stable for surgery.  I have reviewed the patient's chart and labs.  Questions were answered to the patient's satisfaction.    Naseem Varden Curt Bears, MD 04/28/2022 7:09 AM

## 2022-04-28 NOTE — Progress Notes (Signed)
C/o chest discomfort, Dr Curt Bears called/ order followed

## 2022-04-29 ENCOUNTER — Encounter (HOSPITAL_COMMUNITY): Payer: Self-pay | Admitting: Cardiology

## 2022-04-29 DIAGNOSIS — I480 Paroxysmal atrial fibrillation: Secondary | ICD-10-CM | POA: Diagnosis not present

## 2022-04-30 DIAGNOSIS — I480 Paroxysmal atrial fibrillation: Secondary | ICD-10-CM | POA: Diagnosis not present

## 2022-05-01 DIAGNOSIS — I480 Paroxysmal atrial fibrillation: Secondary | ICD-10-CM | POA: Diagnosis not present

## 2022-05-02 DIAGNOSIS — I480 Paroxysmal atrial fibrillation: Secondary | ICD-10-CM | POA: Diagnosis not present

## 2022-05-03 DIAGNOSIS — I480 Paroxysmal atrial fibrillation: Secondary | ICD-10-CM | POA: Diagnosis not present

## 2022-05-04 DIAGNOSIS — I480 Paroxysmal atrial fibrillation: Secondary | ICD-10-CM | POA: Diagnosis not present

## 2022-05-05 DIAGNOSIS — I480 Paroxysmal atrial fibrillation: Secondary | ICD-10-CM | POA: Diagnosis not present

## 2022-05-06 ENCOUNTER — Ambulatory Visit (INDEPENDENT_AMBULATORY_CARE_PROVIDER_SITE_OTHER): Payer: Medicaid Other

## 2022-05-06 DIAGNOSIS — I480 Paroxysmal atrial fibrillation: Secondary | ICD-10-CM | POA: Diagnosis not present

## 2022-05-06 DIAGNOSIS — J309 Allergic rhinitis, unspecified: Secondary | ICD-10-CM | POA: Diagnosis not present

## 2022-05-07 DIAGNOSIS — I480 Paroxysmal atrial fibrillation: Secondary | ICD-10-CM | POA: Diagnosis not present

## 2022-05-08 DIAGNOSIS — I480 Paroxysmal atrial fibrillation: Secondary | ICD-10-CM | POA: Diagnosis not present

## 2022-05-09 ENCOUNTER — Telehealth: Payer: Self-pay | Admitting: Cardiology

## 2022-05-09 DIAGNOSIS — I480 Paroxysmal atrial fibrillation: Secondary | ICD-10-CM | POA: Diagnosis not present

## 2022-05-09 NOTE — Telephone Encounter (Signed)
Question wondering if it was normal for his to lose "a lot of fluid since surgery".  States his legs are "almost skin and bones". Pt aware that this surgery did not pull fluid off, but could improved things if back in NSR. Aware that fluid build up could have been secondary to his AFib, and being back in NSR could reverse the effect. Advised that he is not on a diuretic, and so if continues to have fluid/weight loss he may want to follow up with PCP for further evaluation, if fluid loss is as dramatic as pt describes. Patient verbalized understanding and agreeable to plan.   Dr Curt Bears aware

## 2022-05-09 NOTE — Telephone Encounter (Signed)
New Messge:      Patient said he had a procedure  last week and he have some questions.

## 2022-05-10 DIAGNOSIS — I480 Paroxysmal atrial fibrillation: Secondary | ICD-10-CM | POA: Diagnosis not present

## 2022-05-11 ENCOUNTER — Ambulatory Visit (INDEPENDENT_AMBULATORY_CARE_PROVIDER_SITE_OTHER): Payer: Medicaid Other | Admitting: *Deleted

## 2022-05-11 DIAGNOSIS — J309 Allergic rhinitis, unspecified: Secondary | ICD-10-CM | POA: Diagnosis not present

## 2022-05-11 DIAGNOSIS — I480 Paroxysmal atrial fibrillation: Secondary | ICD-10-CM | POA: Diagnosis not present

## 2022-05-12 DIAGNOSIS — I480 Paroxysmal atrial fibrillation: Secondary | ICD-10-CM | POA: Diagnosis not present

## 2022-05-13 DIAGNOSIS — Z201 Contact with and (suspected) exposure to tuberculosis: Secondary | ICD-10-CM | POA: Diagnosis not present

## 2022-05-13 DIAGNOSIS — R7612 Nonspecific reaction to cell mediated immunity measurement of gamma interferon antigen response without active tuberculosis: Secondary | ICD-10-CM | POA: Diagnosis not present

## 2022-05-13 DIAGNOSIS — Z23 Encounter for immunization: Secondary | ICD-10-CM | POA: Diagnosis not present

## 2022-05-13 DIAGNOSIS — I480 Paroxysmal atrial fibrillation: Secondary | ICD-10-CM | POA: Diagnosis not present

## 2022-05-13 DIAGNOSIS — H30033 Focal chorioretinal inflammation, peripheral, bilateral: Secondary | ICD-10-CM | POA: Diagnosis not present

## 2022-05-14 DIAGNOSIS — I480 Paroxysmal atrial fibrillation: Secondary | ICD-10-CM | POA: Diagnosis not present

## 2022-05-15 ENCOUNTER — Ambulatory Visit
Admission: RE | Admit: 2022-05-15 | Discharge: 2022-05-15 | Disposition: A | Discharge status: Home / Self Care | Payer: Medicaid Other | Source: Ambulatory Visit | Attending: Orthopedic Surgery | Admitting: Orthopedic Surgery

## 2022-05-15 DIAGNOSIS — M542 Cervicalgia: Secondary | ICD-10-CM | POA: Diagnosis not present

## 2022-05-15 DIAGNOSIS — M4802 Spinal stenosis, cervical region: Secondary | ICD-10-CM | POA: Diagnosis not present

## 2022-05-15 DIAGNOSIS — I480 Paroxysmal atrial fibrillation: Secondary | ICD-10-CM | POA: Diagnosis not present

## 2022-05-16 DIAGNOSIS — I480 Paroxysmal atrial fibrillation: Secondary | ICD-10-CM | POA: Diagnosis not present

## 2022-05-17 DIAGNOSIS — I480 Paroxysmal atrial fibrillation: Secondary | ICD-10-CM | POA: Diagnosis not present

## 2022-05-18 DIAGNOSIS — I480 Paroxysmal atrial fibrillation: Secondary | ICD-10-CM | POA: Diagnosis not present

## 2022-05-19 DIAGNOSIS — I480 Paroxysmal atrial fibrillation: Secondary | ICD-10-CM | POA: Diagnosis not present

## 2022-05-20 DIAGNOSIS — I480 Paroxysmal atrial fibrillation: Secondary | ICD-10-CM | POA: Diagnosis not present

## 2022-05-21 DIAGNOSIS — I480 Paroxysmal atrial fibrillation: Secondary | ICD-10-CM | POA: Diagnosis not present

## 2022-05-22 ENCOUNTER — Other Ambulatory Visit: Payer: Self-pay | Admitting: Family Medicine

## 2022-05-22 DIAGNOSIS — U071 COVID-19: Secondary | ICD-10-CM

## 2022-05-22 DIAGNOSIS — I480 Paroxysmal atrial fibrillation: Secondary | ICD-10-CM | POA: Diagnosis not present

## 2022-05-23 DIAGNOSIS — I480 Paroxysmal atrial fibrillation: Secondary | ICD-10-CM | POA: Diagnosis not present

## 2022-05-23 NOTE — Telephone Encounter (Signed)
Requested medications are due for refill today.  unsure  Requested medications are on the active medications list.  yes  Last refill. 03/09/2022 #5 0 rf  Future visit scheduled.   yes  Notes to clinic.  Refill not delegated.    Requested Prescriptions  Pending Prescriptions Disp Refills   predniSONE (DELTASONE) 20 MG tablet [Pharmacy Med Name: PREDNISONE 20 MG TABLET] 5 tablet 0    Sig: TAKE 1 TABLET BY MOUTH DAILY WITH BREAKFAST     Not Delegated - Endocrinology:  Oral Corticosteroids Failed - 05/22/2022 12:35 AM      Failed - This refill cannot be delegated      Failed - Manual Review: Eye exam for IOP if prolonged treatment      Failed - Glucose (serum) in normal range and within 180 days    Glucose  Date Value Ref Range Status  04/15/2022 145 (H) 70 - 99 mg/dL Final   Glucose, Bld  Date Value Ref Range Status  08/19/2020 111 (H) 70 - 99 mg/dL Final    Comment:    Glucose reference range applies only to samples taken after fasting for at least 8 hours.   POC Glucose  Date Value Ref Range Status  04/26/2021 120 (A) 70 - 99 mg/dl Final   Glucose-Capillary  Date Value Ref Range Status  12/11/2015 122 (H) 65 - 99 mg/dL Final         Failed - Last BP in normal range    BP Readings from Last 1 Encounters:  04/28/22 (!) 137/96         Failed - Bone Mineral Density or Dexa Scan completed in the last 2 years      Passed - K in normal range and within 180 days    Potassium  Date Value Ref Range Status  04/15/2022 3.9 3.5 - 5.2 mmol/L Final         Passed - Na in normal range and within 180 days    Sodium  Date Value Ref Range Status  04/15/2022 141 134 - 144 mmol/L Final         Passed - Valid encounter within last 6 months    Recent Outpatient Visits           2 months ago Ganglion cyst of dorsum of right wrist   [place], [place], [place]   2 months ago COVID-19 virus infection   [place], [place], [place]   7 months ago Type 2 diabetes mellitus with other specified complication, without long-term current use of insulin ([place])   [place], [place], [place]   1 year ago Type 2 diabetes mellitus with other specified complication, without long-term current use of insulin ([place])   [place], [place], MD   1 year ago Type 2 diabetes mellitus with hyperglycemia, without long-term current use of insulin ([place])   [place], [place], MD       Future Appointments             In 2 days Marlou Sa, Tonna [place], MD [place]   In 1 week Kozlow, Donnamarie Poag, MD [facility]   In 4 weeks Charlott Rakes, MD [place]   In 2 months Welch, MD [facility]  [facility], [facility]

## 2022-05-24 ENCOUNTER — Ambulatory Visit: Payer: Self-pay

## 2022-05-24 DIAGNOSIS — H30033 Focal chorioretinal inflammation, peripheral, bilateral: Secondary | ICD-10-CM | POA: Diagnosis not present

## 2022-05-24 DIAGNOSIS — H4043X2 Glaucoma secondary to eye inflammation, bilateral, moderate stage: Secondary | ICD-10-CM | POA: Diagnosis not present

## 2022-05-24 DIAGNOSIS — Z961 Presence of intraocular lens: Secondary | ICD-10-CM | POA: Diagnosis not present

## 2022-05-24 DIAGNOSIS — I480 Paroxysmal atrial fibrillation: Secondary | ICD-10-CM | POA: Diagnosis not present

## 2022-05-24 NOTE — Telephone Encounter (Signed)
  Chief Complaint: hyperglycemia Symptoms: BS 407 last night, 220 today, frequent urination, feeling hot and cold  Frequency: yesterday Pertinent Negatives: NA Disposition: '[]'$ ED /'[]'$ Urgent Care (no appt availability in office) / '[]'$ Appointment(In office/virtual)/ '[]'$  [facility]/ '[]'$ Home Care/ '[]'$ Refused Recommended Disposition /'[x]'$ [facility]/ '[]'$  Follow-up with PCP Additional Notes: pt states he noticed frequent urination last night and decided to take his BS which was 407. Pt checked this morning and then BS was 220. Pt was taken off of DM medications since losing weight and BS were normal. Pt is leaving for trip to [facility] next week and requesting to be seen. Offered appt for 05/26/22 at 0830 but pt refused d/t having another appt. Sent Mychart message with info on Mobile Unit for 05/26/22. Pt verbalized understanding.   Summary: blood sugar concerns   The patient checked their blood sugar last night at 9:00 PM 05/23/22 and it was 407  The patient would like to be contacted by a member of clinical staff when possible  The patient has not experienced any dizziness, nausea or vomiting but shares that they have felt generally unwell     Reason for Disposition  [1] Caller has NON-URGENT medication or insulin pump question AND [2] triager unable to answer question  Answer Assessment - Initial Assessment Questions 1. BLOOD GLUCOSE: "What is your blood glucose level?"      407 last night, 220 today  5. TYPE 1 or 2:  "Do you know what type of diabetes you have?"  (e.g., Type 1, Type 2, Gestational; doesn't know)      DM 2  6. INSULIN: "Do you take insulin?" "What type of insulin(s) do you use? What is the mode of delivery? (syringe, pen; injection or pump)?"      no 7. DIABETES PILLS: "Do you take any pills for your diabetes?" If Yes, ask: "Have you missed taking any pills recently?"     Not anymore  8. OTHER SYMPTOMS: "Do you have any symptoms?" (e.g., fever, frequent urination,  difficulty breathing, dizziness, weakness, vomiting)     Frequent urination getting cold and hot  Protocols used: Diabetes - High Blood Sugar-A-AH

## 2022-05-25 ENCOUNTER — Ambulatory Visit (INDEPENDENT_AMBULATORY_CARE_PROVIDER_SITE_OTHER): Payer: Medicaid Other | Admitting: Orthopedic Surgery

## 2022-05-25 ENCOUNTER — Encounter: Payer: Self-pay | Admitting: Orthopedic Surgery

## 2022-05-25 ENCOUNTER — Ambulatory Visit (INDEPENDENT_AMBULATORY_CARE_PROVIDER_SITE_OTHER): Payer: Medicaid Other | Admitting: *Deleted

## 2022-05-25 DIAGNOSIS — J309 Allergic rhinitis, unspecified: Secondary | ICD-10-CM | POA: Diagnosis not present

## 2022-05-25 DIAGNOSIS — M542 Cervicalgia: Secondary | ICD-10-CM

## 2022-05-25 DIAGNOSIS — I480 Paroxysmal atrial fibrillation: Secondary | ICD-10-CM | POA: Diagnosis not present

## 2022-05-25 MED ORDER — HYDROCODONE-ACETAMINOPHEN 5-325 MG PO TABS: 1.0000 | ORAL_TABLET | Freq: Three times a day (TID) | ORAL | 0 refills | Status: DC | PRN

## 2022-05-25 NOTE — Progress Notes (Signed)
Office Visit Note   [name]           Date of Birth: 10-14-1960           MRN: [id number] Visit Date: 05/25/2022 Requested by: Charlott Rakes, MD [place],  [place] [address] PCP: Charlott Rakes, MD  Subjective: Chief Complaint  Patient presents with   Other    Scan review    HPI: Jason Stokes is a 61 y.o. male who presents to the office reporting neck pain.  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  He is on Norco for his knees.  He is disabled.  Also takes Eliquis for stroke he had 3 years ago as well as atrial fibrillation.  Has a history of low back injections.  At times he has to sleep with his hand over his head..                ROS: All systems reviewed are negative as they relate to the chief complaint within the history of present illness.  Patient denies fevers or chills.  Assessment & Plan: Visit Diagnoses:  1. Neck pain     Plan: Impression is left-sided foraminal stenosis with significant pain in the arm along with some weakness.  He is not interested in any type of surgical intervention.  He does have a little bit of decreased strength external rotation biceps and deltoid.  Plan is refer to Dr. Ernestina Patches for consult.  He is leaving for 10 days on 1116.  Norco one-time prescription provided and he needs to be referred to pain management.  I will defer to Dr. Ernestina Patches as to whether or not he believes he needs referral of to Dr. Laurance Flatten for further management.  Follow-Up Instructions: No follow-ups on file.   Orders:  No orders of the defined types were placed in this encounter.  No orders of the defined types were placed in this encounter.     Procedures: No procedures performed   Clinical Data: No additional findings.  Objective: Vital Signs: There were no vitals taken for this visit.  Physical Exam:  Constitutional: Patient appears  well-developed HEENT:  Head: Normocephalic Eyes:EOM are normal Neck: Normal range of motion Cardiovascular: Normal rate Pulmonary/chest: Effort normal Neurologic: Patient is alert Skin: Skin is warm Psychiatric: Patient has normal mood and affect  Ortho Exam: Ortho exam demonstrates 5- out of 5 weakness on the left external rotation testing bicep strength testing and deltoid testing.  Triceps grip wrist extension wrist flexion also 5+ out of 5 bilaterally.  No coarse grinding or crepitus with internal/external rotation of that left shoulder.  Neck range of motion flexion chin to chest extension 30 degrees rotation about 40 degrees bilaterally.  Reflexes symmetric 0 1+ out of 4 bilateral biceps and triceps.  Specialty Comments:  No specialty comments available.  Imaging: No results found.   PMFS History: Patient Active Problem List   Diagnosis Date Noted   Secondary hypercoagulable state ([place]) 03/23/2021   Type 2 diabetes mellitus with hyperglycemia, without long-term current use of insulin ([place]) 02/10/2021   Neck pain on left side 03/06/2019   Musculoskeletal chest pain 07/24/2018   Asthma, mild intermittent 05/02/2018   Allergic rhinitis caused by mold 05/02/2018   Tobacco use 05/02/2018   Bilateral carpal tunnel syndrome 01/10/2018   Stroke ([place])    Schizophrenia ([place])  Migraine    History of nuclear stress test    History of alcohol abuse    GERD (gastroesophageal reflux disease)    Dysrhythmia    Chronic lower back pain    Anxiety    Arthritis of knee, right 04/12/2017   Bipolar disorder ([place]) 04/03/2017   Lipoma of forehead 09/22/2016   Trigger ring finger of right hand 06/22/2016   Paroxysmal atrial fibrillation (HCC)    AF (atrial fibrillation) ([place]) 12/10/2015   Eczema 07/10/2015   History of CVA (cerebrovascular accident) 05/04/2015   Tear of medial meniscus of right knee 03/18/2015   Chronic pain of right knee 03/12/2015   Other and unspecified  hyperlipidemia 11/25/2013   Nasal congestion 11/25/2013   Sinusitis, chronic 05/09/2013   Chronic low back pain 10/12/2012   Lumbar radiculopathy 10/12/2012   Hypertension 10/12/2012   Depression 10/12/2012   Insomnia 10/12/2012   Past Medical History:  Diagnosis Date   Anxiety    Bilateral carpal tunnel syndrome 01/10/2018   Bipolar disorder ([place])    Chronic lower back pain    Depression    Dysrhythmia    a-fib   GERD (gastroesophageal reflux disease)    History of alcohol abuse    History of nuclear stress test    Myoview 10/16: EF 50%, diaphragmatic attenuation, no ischemia, low risk   Hypertension    Migraine 2012-2014   Moderate persistent asthma with acute exacerbation 05/02/2018   PAF (paroxysmal atrial fibrillation) ([place]) 03/27/2015   a. Myoview neg for ischemia >> Flecainide started 10/16 >> FU ETT    Schizophrenia ([place])    Sciatica neuralgia    Small vessel disease (HCC)    Right basal ganglia stroke   Stroke [facility]) "between 2012-2014"   residual "AF" (09/22/2015)    Family History  Problem Relation Age of Onset   Heart disease Father    Schizophrenia Sister     Past Surgical History:  Procedure Laterality Date   ATRIAL FIBRILLATION ABLATION  09/22/2015   ATRIAL FIBRILLATION ABLATION N/A 04/28/2022   Procedure: ATRIAL FIBRILLATION ABLATION;  Surgeon: Constance Haw, MD;  Location: [facility];  Service: Cardiovascular;  Laterality: N/A;   CYST EXCISION  1996-97   surgery back of head    ELECTROPHYSIOLOGIC STUDY N/A 09/22/2015   Procedure: Atrial Fibrillation Ablation;  Surgeon: Will [name], MD;  Location: [place] [facility];  Service: Cardiovascular;  Laterality: N/A;   ELECTROPHYSIOLOGIC STUDY N/A 12/10/2015   Procedure: Atrial Fibrillation Ablation;  Surgeon: Will [name], MD;  Location: [place] [facility];  Service: Cardiovascular;  Laterality: N/A;   ELECTROPHYSIOLOGIC STUDY N/A 12/11/2015   Procedure: Cardioversion;  Surgeon: Will  [name], MD;  Location: [place] [facility];  Service: Cardiovascular;  Laterality: N/A;   EMBOLIZATION Right 08/19/2020   Procedure: EMBOLIZATION;  Surgeon: Cherre Robins, MD;  Location: [place] [place] [facility];  Service: Cardiovascular;  Laterality: Right;  hypogastric   EXCISION MASS HEAD N/A 01/06/2017   Procedure: EXCISION MASS FOREHEAD;  Surgeon: Irene Limbo, MD;  Location: [facility];  Service: Plastics;  Laterality: N/A;   GANGLION CYST EXCISION Left    INTERCOSTAL NERVE BLOCK  2005   KNEE ARTHROSCOPY Right 2016   MASS EXCISION N/A 01/25/2021   Procedure: EXCISION SUBCUTANEOUS VS SUBFASCIAL MASS TORSO 3CM;  Surgeon: Irene Limbo, MD;  Location: [place];  Service: Plastics;  Laterality: N/A;   Social History   Occupational History    Comment:  disabled  Tobacco Use   Smoking status: Every Day    Years: 29.00    Types: Cigarettes   Smokeless tobacco: Never   Tobacco comments:    1 cigarettes every day  06-02-2021  Vaping Use   Vaping Use: Never used  Substance and Sexual Activity   Alcohol use: Yes    Alcohol/week: 2.0 standard drinks of alcohol    Types: 2 Standard drinks or equivalent per week    Comment: 37mxed drinks   Drug use: Yes    Types: Marijuana    Comment: smoked marijuana 01-02-17 for pain   Sexual activity: Not Currently

## 2022-05-26 ENCOUNTER — Ambulatory Visit (HOSPITAL_COMMUNITY)
Admit: 2022-05-26 | Discharge: 2022-05-26 | Disposition: A | Discharge status: Home / Self Care | Payer: Medicaid Other | Source: Ambulatory Visit | Attending: Physician Assistant | Admitting: Physician Assistant

## 2022-05-26 ENCOUNTER — Encounter (HOSPITAL_COMMUNITY): Payer: Self-pay | Admitting: Physician Assistant

## 2022-05-26 ENCOUNTER — Other Ambulatory Visit: Payer: Self-pay

## 2022-05-26 VITALS — BP 118/86 | HR 77 | Ht 73.0 in | Wt 236.4 lb

## 2022-05-26 DIAGNOSIS — Z7901 Long term (current) use of anticoagulants: Secondary | ICD-10-CM | POA: Insufficient documentation

## 2022-05-26 DIAGNOSIS — Z6831 Body mass index (BMI) 31.0-31.9, adult: Secondary | ICD-10-CM | POA: Diagnosis not present

## 2022-05-26 DIAGNOSIS — D6869 Other thrombophilia: Secondary | ICD-10-CM | POA: Diagnosis not present

## 2022-05-26 DIAGNOSIS — M542 Cervicalgia: Secondary | ICD-10-CM

## 2022-05-26 DIAGNOSIS — Z79899 Other long term (current) drug therapy: Secondary | ICD-10-CM | POA: Diagnosis not present

## 2022-05-26 DIAGNOSIS — Z8673 Personal history of transient ischemic attack (TIA), and cerebral infarction without residual deficits: Secondary | ICD-10-CM | POA: Insufficient documentation

## 2022-05-26 DIAGNOSIS — I10 Essential (primary) hypertension: Secondary | ICD-10-CM | POA: Diagnosis not present

## 2022-05-26 DIAGNOSIS — E669 Obesity, unspecified: Secondary | ICD-10-CM | POA: Diagnosis not present

## 2022-05-26 DIAGNOSIS — I480 Paroxysmal atrial fibrillation: Secondary | ICD-10-CM | POA: Diagnosis not present

## 2022-05-26 LAB — COMPREHENSIVE METABOLIC PANEL
ALT: 69 U/L — ABNORMAL HIGH (ref 0–44)
AST: 52 U/L — ABNORMAL HIGH (ref 15–41)
Albumin: 3 g/dL — ABNORMAL LOW (ref 3.5–5.0)
Alkaline Phosphatase: 466 U/L — ABNORMAL HIGH (ref 38–126)
Anion gap: 12 (ref 5–15)
BUN: 8 mg/dL (ref 8–23)
CO2: 22 mmol/L (ref 22–32)
Calcium: 9.2 mg/dL (ref 8.9–10.3)
Chloride: 100 mmol/L (ref 98–111)
Creatinine, Ser: 0.9 mg/dL (ref 0.61–1.24)
GFR, Estimated: >60 mL/min (ref >60)
Glucose, Bld: 395 mg/dL — ABNORMAL HIGH (ref 70–99)
Potassium: 3.8 mmol/L (ref 3.5–5.1)
Sodium: 134 mmol/L — ABNORMAL LOW (ref 135–145)
Total Bilirubin: 1 mg/dL (ref 0.3–1.2)
Total Protein: 6.9 g/dL (ref 6.5–8.1)

## 2022-05-26 LAB — TSH: TSH: 0.405 u[IU]/mL (ref 0.350–4.500)

## 2022-05-26 NOTE — Progress Notes (Signed)
Primary Care Physician: Charlott Rakes, MD Primary Electrophysiologist: Dr Curt Bears Referring Physician: Tommye Standard PA   Jason Stokes is a 61 y.o. male with a history of HTN, prior CVA, DM, atrial fibrillation who presents for follow up in the [facility]. Patient is on Eliquis for a CHADS2VASC score of 4. He is s/p ablation in 2017 and had been maintained on Multaq. He had more frequent breakthrough episodes of afib and was seen by Dr Curt Bears 12/24/21 and was started on amiodarone has a bridge to repeat ablation. Patient is s/p afib ablation 04/28/22.  On follow up today, patient reports that he has done well since the ablation. He notes that his legs are not as swollen since the procedure and he has lost weight. He has been taking amiodarone BID instead of once daily. He denies chest pain, swallowing pain, or groin issues.   Today, he denies symptoms of palpitations, chest pain, shortness of breath, orthopnea, PND, lower extremity edema, dizziness, presyncope, syncope, snoring, daytime somnolence, bleeding, or neurologic sequela. The patient is tolerating medications without difficulties and is otherwise without complaint today.    Atrial Fibrillation Risk Factors:  he does not have symptoms or diagnosis of sleep apnea. he does not have a history of rheumatic fever.   he has a BMI of Body mass index is 31.19 kg/m.Marland Kitchen Filed Weights   05/26/22 1402  Weight: 107.2 kg    Family History  Problem Relation Age of Onset   Heart disease Father    Schizophrenia Sister      Atrial Fibrillation Management history:  Previous antiarrhythmic drugs: Flecainide, amiodarone, Multaq Previous cardioversions: none Previous ablations: 2017, 04/28/22 CHADS2VASC score: 4 Anticoagulation history: Eliquis   Past Medical History:  Diagnosis Date   Anxiety    Bilateral carpal tunnel syndrome 01/10/2018   Bipolar disorder ([place])    Chronic lower back pain     Depression    Dysrhythmia    a-fib   GERD (gastroesophageal reflux disease)    History of alcohol abuse    History of nuclear stress test    Myoview 10/16: EF 50%, diaphragmatic attenuation, no ischemia, low risk   Hypertension    Migraine 2012-2014   Moderate persistent asthma with acute exacerbation 05/02/2018   PAF (paroxysmal atrial fibrillation) ([place]) 03/27/2015   a. Myoview neg for ischemia >> Flecainide started 10/16 >> FU ETT    Schizophrenia ([place])    Sciatica neuralgia    Small vessel disease ([place])    Right basal ganglia stroke   Stroke [facility]) "between 2012-2014"   residual "AF" (09/22/2015)   Past Surgical History:  Procedure Laterality Date   ATRIAL FIBRILLATION ABLATION  09/22/2015   ATRIAL FIBRILLATION ABLATION N/A 04/28/2022   Procedure: ATRIAL FIBRILLATION ABLATION;  Surgeon: Constance Haw, MD;  Location: [facility];  Service: Cardiovascular;  Laterality: N/A;   CYST EXCISION  1996-97   surgery back of head    ELECTROPHYSIOLOGIC STUDY N/A 09/22/2015   Procedure: Atrial Fibrillation Ablation;  Surgeon: Will [name], MD;  Location: [facility];  Service: Cardiovascular;  Laterality: N/A;   ELECTROPHYSIOLOGIC STUDY N/A 12/10/2015   Procedure: Atrial Fibrillation Ablation;  Surgeon: Will [name], MD;  Location: [facility];  Service: Cardiovascular;  Laterality: N/A;   ELECTROPHYSIOLOGIC STUDY N/A 12/11/2015   Procedure: Cardioversion;  Surgeon: Will [name], MD;  Location: [facility];  Service: Cardiovascular;  Laterality: N/A;   EMBOLIZATION Right 08/19/2020  Procedure: EMBOLIZATION;  Surgeon: Cherre Robins, MD;  Location: [facility];  Service: Cardiovascular;  Laterality: Right;  hypogastric   EXCISION MASS HEAD N/A 01/06/2017   Procedure: EXCISION MASS FOREHEAD;  Surgeon: Irene Limbo, MD;  Location: [facility];  Service: Plastics;  Laterality: N/A;   GANGLION CYST EXCISION Left     INTERCOSTAL NERVE BLOCK  2005   KNEE ARTHROSCOPY Right 2016   MASS EXCISION N/A 01/25/2021   Procedure: EXCISION SUBCUTANEOUS VS SUBFASCIAL MASS TORSO 3CM;  Surgeon: Irene Limbo, MD;  Location: [facility];  Service: Plastics;  Laterality: N/A;    Current Outpatient Medications  Medication Sig Dispense Refill   albuterol (VENTOLIN HFA) 108 (90 Base) MCG/ACT inhaler Inhale 2 puffs into the lungs every 4 (four) hours as needed for wheezing or shortness of breath. 54 g 1   amiodarone (PACERONE) 200 MG tablet Take 1 tablet (200 mg total) by mouth daily. 90 tablet 1   apixaban (ELIQUIS) 5 MG TABS tablet TAKE 1 TABLET BY MOUTH TWICE A DAY 60 tablet 5   atorvastatin (LIPITOR) 40 MG tablet Take 1 tablet (40 mg total) by mouth daily. 90 tablet 1   azaTHIOprine (IMURAN) 50 MG tablet Take 100 mg by mouth daily.     azelastine (ASTELIN) 0.1 % nasal spray Place 2 sprays into both nostrils 2 (two) times daily. Use in each nostril as directed (Patient taking differently: Place 1 spray into both nostrils 2 (two) times daily. Use in each nostril as directed) 30 mL 5   brimonidine (ALPHAGAN) 0.2 % ophthalmic solution Place 1 drop into both eyes every 8 (eight) hours.     carvedilol (COREG) 25 MG tablet TAKE 1 TABLET BY MOUTH TWICE A DAY 180 tablet 2   cetirizine (ZYRTEC) 10 MG tablet Take 1 tablet (10 mg total) by mouth 2 (two) times daily as needed for allergies (Can take an extra dose during flare ups.). 60 tablet 5   chlorpheniramine-HYDROcodone (TUSSIONEX) 10-8 MG/5ML Take 5 mLs by mouth every 12 (twelve) hours as needed for cough. 120 mL 0   diclofenac Sodium (VOLTAREN) 1 % GEL Apply 1 Application topically 4 (four) times daily as needed (pain).     Difluprednate 0.05 % EMUL Place 1 drop into both eyes 2 (two) times daily.     diltiazem (CARDIZEM CD) 180 MG 24 hr capsule TAKE 1 CAPSULE BY MOUTH EVERY DAY 90 capsule 3   dorzolamide-timolol (COSOPT) 22.3-6.8 MG/ML ophthalmic solution Place 1  drop into both eyes 2 (two) times daily.     EPINEPHrine 0.3 mg/0.3 mL IJ SOAJ injection Inject 0.3 mg into the muscle as needed for anaphylaxis. 0.3 mL 1   fluticasone (FLONASE) 50 MCG/ACT nasal spray Place 1 spray into both nostrils 2 (two) times daily. (Patient taking differently: Place 2 sprays into both nostrils daily as needed for allergies.) 16 g 5   fluticasone furoate-vilanterol (BREO ELLIPTA) 200-25 MCG/ACT AEPB Inhale 1 puff into the lungs daily. 28 each 5   folic acid (FOLVITE) 1 MG tablet Take 1 mg by mouth daily.     GARLIC PO Take 1 tablet by mouth daily.     hydrALAZINE (APRESOLINE) 100 MG tablet TAKE 1 TABLET (100 MG TOTAL) BY MOUTH 3 (THREE) TIMES DAILY. 270 tablet 2   HYDROcodone-acetaminophen (NORCO) 5-325 MG tablet Take 1 tablet by mouth 3 (three) times daily as needed for moderate pain. 25 tablet 0   HYDROcodone-acetaminophen (NORCO/VICODIN) 5-325 MG tablet Take 1 tablet  by mouth every 6 (six) hours as needed for moderate pain. 15 tablet 0   HYDROcodone-acetaminophen (NORCO/VICODIN) 5-325 MG tablet Take 1 tablet by mouth every 8 (eight) hours as needed for moderate pain. 30 tablet 0   methocarbamol (ROBAXIN) 500 MG tablet Take 1 tablet (500 mg total) by mouth every 8 (eight) hours as needed for muscle spasms. 30 tablet 0   methotrexate (RHEUMATREX) 2.5 MG tablet Take 20 mg by mouth every Monday.     methylPREDNISolone (MEDROL DOSEPAK) 4 MG TBPK tablet Take as directed, 6,5,4,3,2,1 21 tablet 0   Misc. Devices MISC Back brace. Diagnosis chronic back pain 1 each 0   Misc. Devices MISC Left knee brace. Diagnosis L knee pain 1 each 0   montelukast (SINGULAIR) 10 MG tablet Take 1 tablet (10 mg total) by mouth at bedtime. 30 tablet 5   Olopatadine HCl (PATADAY) 0.2 % SOLN Place 1 drop into both eyes daily as needed. 2.5 mL 5   omeprazole (PRILOSEC) 20 MG capsule TAKE 1 CAPSULE BY MOUTH EVERY DAY 90 capsule 1   predniSONE (DELTASONE) 20 MG tablet TAKE 1 TABLET BY MOUTH DAILY WITH  BREAKFAST 5 tablet 0   Vitamin D3 (VITAMIN D) 25 MCG tablet Take 1,000 Units by mouth daily.     No current facility-administered medications for this encounter.    Allergies  Allergen Reactions   Lisinopril Anaphylaxis    Swelling of lips and tongue.   Tylenol [Acetaminophen] Other (See Comments)    Acid reflux     Social History   Socioeconomic History   Marital status: Single    Spouse name: [name]   Number of children: 3   Years of education: HS   Highest education level: Not on file  Occupational History    Comment: disabled  Tobacco Use   Smoking status: Every Day    Years: 29.00    Types: Cigarettes   Smokeless tobacco: Never   Tobacco comments:    1 cigarettes every day  06-02-2021  Vaping Use   Vaping Use: Never used  Substance and Sexual Activity   Alcohol use: Not Currently    Alcohol/week: 6.0 standard drinks of alcohol    Types: 6 Cans of beer per week    Comment: 6 pack weekly 05/26/22   Drug use: Yes    Types: Marijuana    Comment: smoked marijuana 01-02-17 for pain   Sexual activity: Not Currently  Other Topics Concern   Not on file  Social History Narrative   Patient lives at home with [place].    Patient has 3 children 2 step.    Patient has 13 years of schooling.    Patient is right handed.    Social Determinants of Health   Financial Resource Strain: Not on file  Food Insecurity: Not on file  Transportation Needs: Not on file  Physical Activity: Not on file  Stress: Not on file  Social Connections: Not on file  Intimate Partner Violence: Not on file     ROS- All systems are reviewed and negative except as per the HPI above.  Physical Exam: Vitals:   05/26/22 1402  BP: 118/86  Pulse: 77  Weight: 107.2 kg  Height: '6\' 1"'$  (1.854 m)     GEN- The patient is a well appearing male, alert and oriented x 3 today.   HEENT-head normocephalic, atraumatic, sclera clear, conjunctiva pink, hearing intact, trachea midline. Lungs- Clear to  ausculation bilaterally, normal work of breathing Heart- Regular rate and  rhythm, no murmurs, rubs or gallops  GI- soft, NT, ND, + BS Extremities- no clubbing, cyanosis, or edema MS- no significant deformity or atrophy Skin- no rash or lesion Psych- euthymic mood, full affect Neuro- strength and sensation are intact   Wt Readings from Last 3 Encounters:  05/26/22 107.2 kg  04/28/22 116.1 kg  03/16/22 113.5 kg    EKG today demonstrates  SR Vent. rate 77 BPM PR interval 144 ms QRS duration 80 ms QT/QTcB 368/416 ms  Echo 01/13/21 demonstrated   1. Left ventricular ejection fraction, by estimation, is 60 to 65%. The left ventricle has normal function. The left ventricle has no regional wall motion abnormalities. There is moderate left ventricular hypertrophy. Left ventricular diastolic parameters are consistent with Grade I diastolic dysfunction (impaired relaxation). Elevated left ventricular end-diastolic pressure. The E/e' is 70.   2. Right ventricular systolic function is normal. The right ventricular  size is normal.   3. The mitral valve is grossly normal. Trivial mitral valve  regurgitation.   4. The aortic valve is tricuspid. Aortic valve regurgitation is not  visualized.   5. Aortic dilatation noted. There is borderline dilatation of the aortic  root, measuring 38 mm. There is mild dilatation of the ascending aorta, measuring 39 mm.   6. The inferior vena cava is normal in size with greater than 50%  respiratory variability, suggesting right atrial pressure of 3 mmHg.   Epic records are reviewed at length today  CHA2DS2-VASc Score = 4  The patient's score is based upon: CHF History: 0 HTN History: 1 Diabetes History: 1 Stroke History: 2 Vascular Disease History: 0 Age Score: 0 Gender Score: 0        ASSESSMENT AND PLAN: 1. Paroxysmal Atrial Fibrillation (ICD10:  I48.0) The patient's CHA2DS2-VASc score is 4, indicating a 4.8% annual risk of stroke.   S/p afib  ablation 2017 with repeat ablation 04/28/22 Patient appears to be maintaining SR. Decrease amiodarone to 200 mg daily. Check cmet/TSH today. Continue Eliquis 5 mg BID with no missed doses for 3 months post ablation.  Continue carvedilol 25 mg BID Continue diltiazem 180 mg daily  2. Secondary Hypercoagulable State (ICD10:  D68.69) The patient is at significant risk for stroke/thromboembolism based upon his CHA2DS2-VASc Score of 4.  Continue Apixaban (Eliquis).   3. Obesity Body mass index is 31.19 kg/m. Lifestyle modification was discussed and encouraged including regular physical activity and weight reduction.  4. HTN Stable, no changes today.   Follow up with Dr Curt Bears as scheduled.    [facility] [address] [place], [place] [address] [phone] 05/26/2022 2:26 PM

## 2022-05-27 DIAGNOSIS — I480 Paroxysmal atrial fibrillation: Secondary | ICD-10-CM | POA: Diagnosis not present

## 2022-05-28 DIAGNOSIS — I480 Paroxysmal atrial fibrillation: Secondary | ICD-10-CM | POA: Diagnosis not present

## 2022-05-29 DIAGNOSIS — I480 Paroxysmal atrial fibrillation: Secondary | ICD-10-CM | POA: Diagnosis not present

## 2022-05-30 ENCOUNTER — Ambulatory Visit (INDEPENDENT_AMBULATORY_CARE_PROVIDER_SITE_OTHER): Payer: Medicaid Other | Admitting: Physical Medicine and Rehabilitation

## 2022-05-30 ENCOUNTER — Encounter: Payer: Self-pay | Admitting: Physical Medicine and Rehabilitation

## 2022-05-30 DIAGNOSIS — M4802 Spinal stenosis, cervical region: Secondary | ICD-10-CM | POA: Diagnosis not present

## 2022-05-30 DIAGNOSIS — M4722 Other spondylosis with radiculopathy, cervical region: Secondary | ICD-10-CM | POA: Diagnosis not present

## 2022-05-30 DIAGNOSIS — M5412 Radiculopathy, cervical region: Secondary | ICD-10-CM | POA: Diagnosis not present

## 2022-05-30 DIAGNOSIS — I480 Paroxysmal atrial fibrillation: Secondary | ICD-10-CM | POA: Diagnosis not present

## 2022-05-30 MED ORDER — DIAZEPAM 5 MG PO TABS: ORAL_TABLET | ORAL | 0 refills | Status: DC

## 2022-05-30 NOTE — Progress Notes (Unsigned)
OLANDER FRIEDL - 61 y.o. male MRN [id number]  Date of birth: 10-02-1960  Office Visit Note: Visit Date: 05/30/2022 PCP: Charlott Rakes, MD Referred by: Charlott Rakes, MD  Subjective: Chief Complaint  Patient presents with   Neck - Pain   HPI: Jason Stokes is a 61 y.o. male who comes in today per the request of Dr. Alphonzo Severance for evaluation of chronic, worsening and severe left sided neck pain with intermittent radiation to shoulder and down arm to hand. Pain ongoing for several months and is exacerbated by movement and activity. He describes pain as throbbing and tingling sensation, currently rates as 8 out of 10. Some relief of pain with home exercise regimen, ice/heat, TENS unit and medications. Taking Norco as needed for moderate/severe pain, short course prescribed by Dr. Marlou Sa. Patient did complete regimen of formal physical therapy/dry needling at [facility], reports good relief of pain with these treatments. Recent cervical MRI imaging exhibits multi level degenerative facet changes, advanced foraminal stenosis on the left at C3-C4, moderate on the right at C5-C6. No history of cervical surgery/injections. His chronic pain was previously managed by Dr. Kellie Shropshire at [facility], patient has requested new pain management provider, Dr. Marlou Sa did place referral last week. Patient reports history of lower back injections several years ago with Dr. Derry Skill at [facility], he reports good relief of pain with these procedures. Patient denies focal weakness. Patient denies recent trauma or falls.   Patients course is complicated by stroke, atrial fibrillation, currently taking Eliquis.    Review of Systems  Musculoskeletal:  Positive for neck pain.  Neurological:  Positive for tingling. Negative for focal weakness and weakness.  All other systems reviewed and are negative.  Otherwise per HPI.  Assessment & Plan: Visit Diagnoses:     ICD-10-CM   1. Radiculopathy, cervical region  M54.12 Ambulatory referral to Physical Medicine Rehab    2. Other spondylosis with radiculopathy, cervical region  M47.22 Ambulatory referral to Physical Medicine Rehab    3. Foraminal stenosis of cervical region  M48.02 Ambulatory referral to Physical Medicine Rehab       Plan: Findings:  Chronic, worsening and severe left sided neck pain with intermittent radiation of pain to shoulder and down arm to hand. Patient continues to have severe pain despite good conservative therapies such as formal physical therapy/dry needling, home exercise regimen, rest and use of medications. Patients clinical presentation and exam are consistent with C5/C6 nerve pattern. Next step is to perform diagnostic and therapeutic left C7-T1 interlaminar epidural steroid injection under fluoroscopic guidance. If good relief of pain with injection we can repeat this procedure infrequently as needed. He is currently taking Eliquis, will contact Dr. Allegra Lai at [facility] for permission to come off medication for injection.  He did voice concerns about anxiety surrounding procedure, I did prescribe pre-procedure Valium for him to take on day of injection. Patient instructed to continue with current medication regimen, encouraged to contact Dr. Forbes Cellar office regarding pain management referral if he has not heard back this week. If his pain persists post injection could look at re-grouping with physical therapy. No red flag symptoms noted upon exam today.     Meds & Orders:  Meds ordered this encounter  Medications   diazepam (VALIUM) 5 MG tablet    Sig: Take one tablet by mouth with food one hour prior to procedure. May repeat 30 minutes prior if needed.    Dispense:  2 tablet    Refill:  0    Orders Placed This Encounter  Procedures   Ambulatory referral to Physical Medicine Rehab    Follow-up: Return for Left C7-T1 interlaminar epidural steroid injection .    Procedures: No procedures performed      Clinical History: MRI CERVICAL SPINE WITHOUT CONTRAST   TECHNIQUE: Multiplanar, multisequence MR imaging of the cervical spine was performed. No intravenous contrast was administered.   COMPARISON:  Cervical spine CT 12/06/2020   FINDINGS: Alignment: Unremarkable   Vertebrae: No fracture, evidence of discitis, or bone lesion.   Cord: Normal signal and morphology.   Posterior Fossa, vertebral arteries, paraspinal tissues: Suboccipital scarring. No perispinal mass or inflammation.   Disc levels:   C2-3: Asymmetric left facet spurring.  No neural impingement   C3-4: Disc space narrowing and bulging with endplate and uncovertebral ridging. Left foraminal impingement. A disc osteophyte complex mildly indents the ventral cord   C4-5: Disc narrowing and bulging. Asymmetric left facet spurring. Moderate left and mild right foraminal narrowing   C5-6: Disc space narrowing and bulging with uncovertebral ridging. Mild facet spurring. Moderate bilateral foraminal narrowing   IMPRESSION: 1. 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 Moderate right foraminal narrowing at C5-6. 2. Diffusely patent spinal canal.     Electronically Signed   By: Jorje Guild M.D.   On: 05/18/2022 10:22   He reports that he has been smoking cigarettes. He has never used smokeless tobacco.  Recent Labs    09/29/21 1654  HGBA1C 5.8    Objective:  VS:  HT:    WT:   BMI:     BP:   HR: bpm  TEMP: ( )  RESP:  Physical Exam Vitals and nursing note reviewed.  HENT:     Head: Normocephalic and atraumatic.     Right Ear: External ear normal.     Left Ear: External ear normal.     Nose: Nose normal.     Mouth/Throat:     Mouth: Mucous membranes are moist.  Eyes:     Extraocular Movements: Extraocular movements intact.  Cardiovascular:     Rate and Rhythm: Normal rate.     Pulses: Normal pulses.  Pulmonary:      Effort: Pulmonary effort is normal.  Abdominal:     General: Abdomen is flat. There is no distension.  Musculoskeletal:        General: Tenderness present.     Cervical back: Tenderness present.     Comments: No discomfort noted with flexion, extension and side-to-side rotation. Patient has good strength in the upper extremities including 5 out of 5 strength in wrist extension, long finger flexion and APB. There is no atrophy of the hands intrinsically. Sensation intact bilaterally. Dysesthesias noted to left C5/C6 dermatomes. Negative Hoffman's sign.   Skin:    General: Skin is warm and dry.     Capillary Refill: Capillary refill takes less than 2 seconds.  Neurological:     General: No focal deficit present.     Mental Status: He is alert and oriented to person, place, and time.  Psychiatric:        Mood and Affect: Mood normal.        Behavior: Behavior normal.     Ortho Exam  Imaging: No results found.  Past Medical/Family/Surgical/Social History: Medications & Allergies reviewed per EMR, new medications updated. Patient Active Problem List   Diagnosis Date Noted   Hypercoagulable  state due to paroxysmal atrial fibrillation ([place]) 03/23/2021   Type 2 diabetes mellitus with hyperglycemia, without long-term current use of insulin ([place]) 02/10/2021   Neck pain on left side 03/06/2019   Musculoskeletal chest pain 07/24/2018   Asthma, mild intermittent 05/02/2018   Allergic rhinitis caused by mold 05/02/2018   Tobacco use 05/02/2018   Bilateral carpal tunnel syndrome 01/10/2018   Stroke ([place])    Schizophrenia ([place])    Migraine    History of nuclear stress test    History of alcohol abuse    GERD (gastroesophageal reflux disease)    Dysrhythmia    Chronic lower back pain    Anxiety    Arthritis of knee, right 04/12/2017   Bipolar disorder ([place]) 04/03/2017   Lipoma of forehead 09/22/2016   Trigger ring finger of right hand 06/22/2016   Paroxysmal atrial fibrillation  (H[facility])    AF (atrial fibrillation) ([place]) 12/10/2015   Eczema 07/10/2015   History of CVA (cerebrovascular accident) 05/04/2015   Tear of medial meniscus of right knee 03/18/2015   Chronic pain of right knee 03/12/2015   Other and unspecified hyperlipidemia 11/25/2013   Nasal congestion 11/25/2013   Sinusitis, chronic 05/09/2013   Chronic low back pain 10/12/2012   Lumbar radiculopathy 10/12/2012   Hypertension 10/12/2012   Depression 10/12/2012   Insomnia 10/12/2012   Past Medical History:  Diagnosis Date   Anxiety    Bilateral carpal tunnel syndrome 01/10/2018   Bipolar disorder ([place])    Chronic lower back pain    Depression    Dysrhythmia    a-fib   GERD (gastroesophageal reflux disease)    History of alcohol abuse    History of nuclear stress test    Myoview 10/16: EF 50%, diaphragmatic attenuation, no ischemia, low risk   Hypertension    Migraine 2012-2014   Moderate persistent asthma with acute exacerbation 05/02/2018   PAF (paroxysmal atrial fibrillation) ([place]) 03/27/2015   a. Myoview neg for ischemia >> Flecainide started 10/16 >> FU ETT    Schizophrenia ([place])    Sciatica neuralgia    Small vessel disease (HCC)    Right basal ganglia stroke   Stroke [facility]) "between 2012-2014"   residual "AF" (09/22/2015)   Family History  Problem Relation Age of Onset   Heart disease Father    Schizophrenia Sister    Past Surgical History:  Procedure Laterality Date   ATRIAL FIBRILLATION ABLATION  09/22/2015   ATRIAL FIBRILLATION ABLATION N/A 04/28/2022   Procedure: ATRIAL FIBRILLATION ABLATION;  Surgeon: Constance Haw, MD;  Location: [facility] [facility];  Service: Cardiovascular;  Laterality: N/A;   CYST EXCISION  1996-97   surgery back of head    ELECTROPHYSIOLOGIC STUDY N/A 09/22/2015   Procedure: Atrial Fibrillation Ablation;  Surgeon: Will [name], MD;  Location: [facility];  Service: Cardiovascular;  Laterality: N/A;   ELECTROPHYSIOLOGIC STUDY N/A  12/10/2015   Procedure: Atrial Fibrillation Ablation;  Surgeon: Will [name], MD;  Location: [place];  Service: Cardiovascular;  Laterality: N/A;   ELECTROPHYSIOLOGIC STUDY N/A 12/11/2015   Procedure: Cardioversion;  Surgeon: Will [name], MD;  Location: [place] [facility];  Service: Cardiovascular;  Laterality: N/A;   EMBOLIZATION Right 08/19/2020   Procedure: EMBOLIZATION;  Surgeon: Cherre Robins, MD;  Location: [place] [facility];  Service: Cardiovascular;  Laterality: Right;  hypogastric   EXCISION MASS HEAD N/A 01/06/2017   Procedure: EXCISION MASS FOREHEAD;  Surgeon: Irene Limbo, MD;  Location: [facility];  Service: Plastics;  Laterality: N/A;   GANGLION CYST EXCISION Left    INTERCOSTAL NERVE BLOCK  2005   KNEE ARTHROSCOPY Right 2016   MASS EXCISION N/A 01/25/2021   Procedure: EXCISION SUBCUTANEOUS VS SUBFASCIAL MASS TORSO 3CM;  Surgeon: Irene Limbo, MD;  Location: [place];  Service: Plastics;  Laterality: N/A;   Social History   Occupational History    Comment: disabled  Tobacco Use   Smoking status: Every Day    Years: 29.00    Types: Cigarettes   Smokeless tobacco: Never   Tobacco comments:    1 cigarettes every day  06-02-2021  Vaping Use   Vaping Use: Never used  Substance and Sexual Activity   Alcohol use: Not Currently    Alcohol/week: 6.0 standard drinks of alcohol    Types: 6 Cans of beer per week    Comment: 6 pack weekly 05/26/22   Drug use: Yes    Types: Marijuana    Comment: smoked marijuana 01-02-17 for pain   Sexual activity: Not Currently

## 2022-05-30 NOTE — Progress Notes (Unsigned)
Numeric Pain Rating Scale and Functional Assessment Average Pain 8   In the last MONTH (on 0-10 scale) has pain interfered with the following?  1. General activity like being  able to carry out your everyday physical activities such as walking, climbing stairs, carrying groceries, or moving a chair?  Rating( ranges between 8-10 )   Any head movement makes pain worse. Cannot raise either arm past the head, but worse on left. Pain can radiate into head and cause headaches.

## 2022-05-31 ENCOUNTER — Ambulatory Visit (INDEPENDENT_AMBULATORY_CARE_PROVIDER_SITE_OTHER): Payer: Medicaid Other | Admitting: Allergy and Immunology

## 2022-05-31 ENCOUNTER — Encounter: Payer: Self-pay | Admitting: Allergy and Immunology

## 2022-05-31 ENCOUNTER — Ambulatory Visit: Payer: Self-pay

## 2022-05-31 VITALS — BP 148/90 | HR 103 | Temp 97.3°F | Resp 18 | Ht 73.0 in | Wt 233.6 lb

## 2022-05-31 DIAGNOSIS — H1013 Acute atopic conjunctivitis, bilateral: Secondary | ICD-10-CM

## 2022-05-31 DIAGNOSIS — J3089 Other allergic rhinitis: Secondary | ICD-10-CM

## 2022-05-31 DIAGNOSIS — H1010 Acute atopic conjunctivitis, unspecified eye: Secondary | ICD-10-CM

## 2022-05-31 DIAGNOSIS — J4550 Severe persistent asthma, uncomplicated: Secondary | ICD-10-CM

## 2022-05-31 DIAGNOSIS — F17200 Nicotine dependence, unspecified, uncomplicated: Secondary | ICD-10-CM | POA: Diagnosis not present

## 2022-05-31 DIAGNOSIS — F1290 Cannabis use, unspecified, uncomplicated: Secondary | ICD-10-CM

## 2022-05-31 DIAGNOSIS — I480 Paroxysmal atrial fibrillation: Secondary | ICD-10-CM | POA: Diagnosis not present

## 2022-05-31 DIAGNOSIS — J309 Allergic rhinitis, unspecified: Secondary | ICD-10-CM

## 2022-05-31 DIAGNOSIS — J301 Allergic rhinitis due to pollen: Secondary | ICD-10-CM

## 2022-05-31 MED ORDER — ALBUTEROL SULFATE HFA 108 (90 BASE) MCG/ACT IN AERS: 2.0000 | INHALATION_SPRAY | RESPIRATORY_TRACT | 1 refills | Status: DC | PRN

## 2022-05-31 MED ORDER — FLUTICASONE PROPIONATE 50 MCG/ACT NA SUSP: NASAL | 5 refills | Status: DC

## 2022-05-31 MED ORDER — FLUTICASONE FUROATE-VILANTEROL 200-25 MCG/ACT IN AEPB: 1.0000 | INHALATION_SPRAY | Freq: Every day | RESPIRATORY_TRACT | 5 refills | Status: DC

## 2022-05-31 MED ORDER — AZELASTINE HCL 0.1 % NA SOLN: NASAL | 5 refills | Status: DC

## 2022-05-31 MED ORDER — MONTELUKAST SODIUM 10 MG PO TABS: 10.0000 mg | ORAL_TABLET | Freq: Every day | ORAL | 5 refills | Status: DC

## 2022-05-31 NOTE — Patient Instructions (Signed)
  1.  Continue to Treat and prevent inflammation:   A.  Flonase 1 spray each nostril twice a day  B.  Azelastine nasal spray 1 spray each nostril twice a day  C.  Montelukast 10 mg 1 tablet 1 time per day  D.  Breo 200 - 1 inhalation 1 time per day  E.  Immunotherapy  2. If needed:   A.  Cetirizine 10 mg 1 tablet 1-2 times a day  B.  Pataday - 1 drop each eye 1 time per day  C.  Pro-air HFA 2 puffs every 4-6 hours  3.  Tammy contact you about starting biologic agent  4. Return to clinic in 6 months or earlier if problem  5.  Obtain RSV vaccine

## 2022-05-31 NOTE — Progress Notes (Signed)
[facility] [facility] [place] [facility] [place]   Follow-up Note  Referring Provider: Charlott Rakes, MD Primary Provider: Charlott Rakes, MD Date of Office Visit: 05/31/2022  Subjective:   Jason Stokes (DOB: 1961/07/17) is a 61 y.o. male who returns to the [facility] [place] [facility] on 05/31/2022 in re-evaluation of the following:  HPI: Siler returns to this clinic in evaluation of asthma, allergic rhinoconjunctivitis and occasional exposure to smoke.  His last visit to this clinic with me was 23 Nov 2021.  He continues on a large collection of anti-inflammatory agents for both his upper and lower airway and also continues to use immunotherapy every 4 weeks without any adverse effect.  He is not entirely sure that he has good control of his asthma.  He still must use a short acting bronchodilator twice a day even while utilizing his current plan.  Fortunately, he has not required a systemic steroid to treat an exacerbation since I last saw him in this clinic.  He does have some problems with nasal congestion and sneezing as well.  He is now visiting with [facility] about his eye issues.  He has obtained the flu vaccine.  Allergies as of 05/31/2022       Reactions   Lisinopril Anaphylaxis   Swelling of lips and tongue.   Tylenol [acetaminophen] Other (See Comments)   Acid reflux         Medication List    albuterol 108 (90 Base) MCG/ACT inhaler Commonly known as: Ventolin HFA Inhale 2 puffs into the lungs every 4 (four) hours as needed for wheezing or shortness of breath.   amiodarone 200 MG tablet Commonly known as: PACERONE Take 1 tablet (200 mg total) by mouth daily.   atorvastatin 40 MG tablet Commonly known as: LIPITOR Take 1 tablet (40 mg total) by mouth daily.   azaTHIOprine 50 MG tablet Commonly known as: IMURAN Take 100 mg by mouth daily.   azelastine 0.1 % nasal spray Commonly known as: ASTELIN Place 2 sprays into  both nostrils 2 (two) times daily. Use in each nostril as directed What changed: how much to take   brimonidine 0.2 % ophthalmic solution Commonly known as: ALPHAGAN Place 1 drop into both eyes every 8 (eight) hours.   carvedilol 25 MG tablet Commonly known as: COREG TAKE 1 TABLET BY MOUTH TWICE A DAY   cetirizine 10 MG tablet Commonly known as: ZYRTEC Take 1 tablet (10 mg total) by mouth 2 (two) times daily as needed for allergies (Can take an extra dose during flare ups.).   chlorpheniramine-HYDROcodone 10-8 MG/5ML Commonly known as: TUSSIONEX Take 5 mLs by mouth every 12 (twelve) hours as needed for cough.   diazepam 5 MG tablet Commonly known as: VALIUM Take one tablet by mouth with food one hour prior to procedure. May repeat 30 minutes prior if needed.   diclofenac Sodium 1 % Gel Commonly known as: VOLTAREN Apply 1 Application topically 4 (four) times daily as needed (pain).   Difluprednate 0.05 % Emul Place 1 drop into both eyes 2 (two) times daily.   diltiazem 180 MG 24 hr capsule Commonly known as: CARDIZEM CD TAKE 1 CAPSULE BY MOUTH EVERY DAY   dorzolamide-timolol 2-0.5 % ophthalmic solution Commonly known as: COSOPT Place 1 drop into both eyes 2 (two) times daily.   Eliquis 5 MG Tabs tablet Generic drug: apixaban TAKE 1 TABLET BY MOUTH TWICE A DAY   EPINEPHrine 0.3 mg/0.3 mL Soaj  injection Commonly known as: EPI-PEN Inject 0.3 mg into the muscle as needed for anaphylaxis.   fluticasone 50 MCG/ACT nasal spray Commonly known as: FLONASE Place 1 spray into both nostrils 2 (two) times daily. What changed:  how much to take when to take this reasons to take this   fluticasone furoate-vilanterol 200-25 MCG/ACT Aepb Commonly known as: Breo Ellipta Inhale 1 puff into the lungs daily.   folic acid 1 MG tablet Commonly known as: FOLVITE Take 1 mg by mouth daily.   GARLIC PO Take 1 tablet by mouth daily.   hydrALAZINE 100 MG tablet Commonly known as:  APRESOLINE TAKE 1 TABLET (100 MG TOTAL) BY MOUTH 3 (THREE) TIMES DAILY.   HYDROcodone-acetaminophen 5-325 MG tablet Commonly known as: Norco Take 1 tablet by mouth 3 (three) times daily as needed for moderate pain.   HYDROcodone-acetaminophen 5-325 MG tablet Commonly known as: NORCO/VICODIN Take 1 tablet by mouth every 6 (six) hours as needed for moderate pain.   HYDROcodone-acetaminophen 5-325 MG tablet Commonly known as: NORCO/VICODIN Take 1 tablet by mouth every 8 (eight) hours as needed for moderate pain.   methocarbamol 500 MG tablet Commonly known as: ROBAXIN Take 1 tablet (500 mg total) by mouth every 8 (eight) hours as needed for muscle spasms.   methotrexate 2.5 MG tablet Commonly known as: RHEUMATREX Take 20 mg by mouth every Monday.   Misc. Devices Misc Back brace. Diagnosis chronic back pain   Misc. Devices Misc Left knee brace. Diagnosis L knee pain   montelukast 10 MG tablet Commonly known as: Singulair Take 1 tablet (10 mg total) by mouth at bedtime.   Olopatadine HCl 0.2 % Soln Commonly known as: Pataday Place 1 drop into both eyes daily as needed.   omeprazole 20 MG capsule Commonly known as: PRILOSEC TAKE 1 CAPSULE BY MOUTH EVERY DAY   vitamin D3 25 MCG tablet Commonly known as: CHOLECALCIFEROL Take 1,000 Units by mouth daily.    Past Medical History:  Diagnosis Date   Anxiety    Bilateral carpal tunnel syndrome 01/10/2018   Bipolar disorder (HCC)    Chronic lower back pain    Depression    Dysrhythmia    a-fib   GERD (gastroesophageal reflux disease)    History of alcohol abuse    History of nuclear stress test    Myoview 10/16: EF 50%, diaphragmatic attenuation, no ischemia, low risk   Hypertension    Migraine 2012-2014   Moderate persistent asthma with acute exacerbation 05/02/2018   PAF (paroxysmal atrial fibrillation) ([place]) 03/27/2015   a. Myoview neg for ischemia >> Flecainide started 10/16 >> FU ETT    Schizophrenia ([place])     Sciatica neuralgia    Small vessel disease ([place])    Right basal ganglia stroke   Stroke [facility]) "between 2012-2014"   residual "AF" (09/22/2015)    Past Surgical History:  Procedure Laterality Date   ATRIAL FIBRILLATION ABLATION  09/22/2015   ATRIAL FIBRILLATION ABLATION N/A 04/28/2022   Procedure: ATRIAL FIBRILLATION ABLATION;  Surgeon: Constance Haw, MD;  Location: [facility];  Service: Cardiovascular;  Laterality: N/A;   CYST EXCISION  1996-97   surgery back of head    ELECTROPHYSIOLOGIC STUDY N/A 09/22/2015   Procedure: Atrial Fibrillation Ablation;  Surgeon: Will [name], MD;  Location: [place] [facility];  Service: Cardiovascular;  Laterality: N/A;   ELECTROPHYSIOLOGIC STUDY N/A 12/10/2015   Procedure: Atrial Fibrillation Ablation;  Surgeon: Will [name], MD;  Location: [place] [facility]  [facility];  Service: Cardiovascular;  Laterality: N/A;   ELECTROPHYSIOLOGIC STUDY N/A 12/11/2015   Procedure: Cardioversion;  Surgeon: Will [name], MD;  Location: [place] [facility];  Service: Cardiovascular;  Laterality: N/A;   EMBOLIZATION Right 08/19/2020   Procedure: EMBOLIZATION;  Surgeon: Cherre Robins, MD;  Location: [facility];  Service: Cardiovascular;  Laterality: Right;  hypogastric   EXCISION MASS HEAD N/A 01/06/2017   Procedure: EXCISION MASS FOREHEAD;  Surgeon: Irene Limbo, MD;  Location: [facility];  Service: Plastics;  Laterality: N/A;   GANGLION CYST EXCISION Left    INTERCOSTAL NERVE BLOCK  2005   KNEE ARTHROSCOPY Right 2016   MASS EXCISION N/A 01/25/2021   Procedure: EXCISION SUBCUTANEOUS VS SUBFASCIAL MASS TORSO 3CM;  Surgeon: Irene Limbo, MD;  Location: [facility];  Service: Plastics;  Laterality: N/A;    Review of systems negative except as noted in HPI / PMHx or noted below:  Review of Systems  Constitutional: Negative.   HENT: Negative.    Eyes: Negative.   Respiratory: Negative.    Cardiovascular:  Negative.   Gastrointestinal: Negative.   Genitourinary: Negative.   Musculoskeletal: Negative.   Skin: Negative.   Neurological: Negative.   Endo/Heme/Allergies: Negative.   Psychiatric/Behavioral: Negative.       Objective:   Vitals:   05/31/22 0911  BP: (!) 148/90  Pulse: (!) 103  Resp: 18  Temp: (!) 97.3 F (36.3 C)  SpO2: 98%   Height: '6\' 1"'$  (185.4 cm)  Weight: 233 lb 9.6 oz (106 kg)   Physical Exam Constitutional:      Appearance: He is not diaphoretic.  HENT:     Head: Normocephalic.     Right Ear: Tympanic membrane, ear canal and external ear normal.     Left Ear: Tympanic membrane, ear canal and external ear normal.     Nose: Nose normal. No mucosal edema or rhinorrhea.     Mouth/Throat:     Pharynx: Uvula midline. No oropharyngeal exudate.  Eyes:     Conjunctiva/sclera: Conjunctivae normal.  Neck:     Thyroid: No thyromegaly.     Trachea: Trachea normal. No tracheal tenderness or tracheal deviation.  Cardiovascular:     Rate and Rhythm: Normal rate and regular rhythm.     Heart sounds: Normal heart sounds, S1 normal and S2 normal. No murmur heard. Pulmonary:     Effort: No respiratory distress.     Breath sounds: Normal breath sounds. No stridor. No wheezing or rales.  Lymphadenopathy:     Head:     Right side of head: No tonsillar adenopathy.     Left side of head: No tonsillar adenopathy.     Cervical: No cervical adenopathy.  Skin:    Findings: No erythema or rash.     Nails: There is no clubbing.  Neurological:     Mental Status: He is alert.     Diagnostics:    Spirometry was performed and demonstrated an FEV1 of 2.59 at 78 % of predicted.  Review blood tests obtained 15 April 2022 identifies WBC 6.6, absolute eosinophil 100, absolute lymphocyte 1600, hemoglobin 12.8, platelet 168.  Assessment and Plan:   1. Asthma, severe persistent, well-controlled   2. Perennial allergic rhinitis   3. Seasonal allergic rhinitis due to pollen    4. Perennial allergic conjunctivitis of both eyes   5. Seasonal allergic conjunctivitis   6. Light tobacco smoker   7. Cannabis use, uncomplicated  1.  Continue to Treat and prevent inflammation:   A.  Flonase 1 spray each nostril twice a day  B.  Azelastine nasal spray 1 spray each nostril twice a day  C.  Montelukast 10 mg 1 tablet 1 time per day  D.  Breo 200 - 1 inhalation 1 time per day  E.  Immunotherapy  2. If needed:   A.  Cetirizine 10 mg 1 tablet 1-2 times a day  B.  Pataday - 1 drop each eye 1 time per day  C.  Pro-air HFA 2 puffs every 4-6 hours  3.  Tammy contact you about starting biologic agent  4. Return to clinic in 6 months or earlier if problem  5.  Obtain RSV vaccine  We will see if Hermon is eligible to receive a biologic agents once we can get insurance approval.  We will initially attempt to obtain approval for tezepelumab.  He will continue on a large collection of anti-inflammatory agents for his airway inflammation including the use of immunotherapy.  Allena Katz, MD Allergy / Immunology [place]

## 2022-06-01 ENCOUNTER — Encounter: Payer: Self-pay | Admitting: Allergy and Immunology

## 2022-06-01 DIAGNOSIS — I480 Paroxysmal atrial fibrillation: Secondary | ICD-10-CM | POA: Diagnosis not present

## 2022-06-02 ENCOUNTER — Telehealth: Payer: Self-pay | Admitting: Physical Medicine and Rehabilitation

## 2022-06-02 DIAGNOSIS — I480 Paroxysmal atrial fibrillation: Secondary | ICD-10-CM | POA: Diagnosis not present

## 2022-06-02 NOTE — Telephone Encounter (Signed)
Pt returned call to set an referral appt. Please call pt at [phone].

## 2022-06-02 NOTE — Telephone Encounter (Signed)
Please submit for clearance for BT's

## 2022-06-03 DIAGNOSIS — I480 Paroxysmal atrial fibrillation: Secondary | ICD-10-CM | POA: Diagnosis not present

## 2022-06-04 DIAGNOSIS — I480 Paroxysmal atrial fibrillation: Secondary | ICD-10-CM | POA: Diagnosis not present

## 2022-06-05 DIAGNOSIS — I480 Paroxysmal atrial fibrillation: Secondary | ICD-10-CM | POA: Diagnosis not present

## 2022-06-06 DIAGNOSIS — I480 Paroxysmal atrial fibrillation: Secondary | ICD-10-CM | POA: Diagnosis not present

## 2022-06-07 ENCOUNTER — Telehealth: Payer: Self-pay

## 2022-06-07 DIAGNOSIS — I700 Atherosclerosis of aorta: Secondary | ICD-10-CM | POA: Insufficient documentation

## 2022-06-07 DIAGNOSIS — I480 Paroxysmal atrial fibrillation: Secondary | ICD-10-CM | POA: Diagnosis not present

## 2022-06-07 NOTE — Telephone Encounter (Signed)
Patient with diagnosis of afib on Eliquis for anticoagulation.    Procedure: epidural steroid injection Date of procedure: TBD  CHA2DS2-VASc Score = 5  This indicates a 7.2% annual risk of stroke. The patient's score is based upon: CHF History: 0 HTN History: 1 Diabetes History: 1 Stroke History: 2 Vascular Disease History: 1 Age Score: 0 Gender Score: 0  Aortic atherosclerosis noted on cardiac CT 04/2022, PMH updated.  CrCl >161m/min Platelet count 168K  Pt underwent afib ablation 04/28/22 and cannot hold anticoagulation for 3 months afterwards. Procedure would need to be mid January at the earliest. [facility] Since pt is at elevated CV risk off of anticoagulation given afib and prior stroke, would also need MD clearance at that time for 3 day DOAC hold.  **This guidance is not considered finalized until pre-operative APP has relayed final recommendations.**

## 2022-06-07 NOTE — Telephone Encounter (Signed)
   Pre-operative Risk Assessment    Patient Name: Jason Stokes  DOB: [birth year] MRN: [id number]     Request for Surgical Clearance    Procedure: Epidural steroid injection   Date of Surgery:  Clearance TBD                                 Surgeon:  Dr. Ernestina Patches  Surgeon's Group or Practice Name:  [facility] Phone number:  [phone] Fax number:  [phone] Attn: Dr. Ernestina Patches scheduling    Type of Clearance Requested:   - Pharmacy:  Hold Apixaban (Eliquis)     Type of Anesthesia:  Not Indicated   Additional requests/questions:    [name]   06/07/2022, 3:16 PM

## 2022-06-07 NOTE — Telephone Encounter (Signed)
Preoperative team, patient will not be eligible for holding anticoagulation until at least mid January due to recent A-fib ablation.  Please contact the requesting office and let them know that they will need to resubmit request for procedure closer to that time.  Thank you for your help.  Jossie Ng. Minor Iden NP-C     06/07/2022, 4:20 PM [facility] [name] [address] Office [phone] Fax [phone]

## 2022-06-08 ENCOUNTER — Telehealth: Payer: Self-pay | Admitting: Physical Medicine and Rehabilitation

## 2022-06-08 DIAGNOSIS — I480 Paroxysmal atrial fibrillation: Secondary | ICD-10-CM | POA: Diagnosis not present

## 2022-06-08 NOTE — Telephone Encounter (Signed)
Blood thinner form faxed by CSW 06/07/22

## 2022-06-09 DIAGNOSIS — I480 Paroxysmal atrial fibrillation: Secondary | ICD-10-CM | POA: Diagnosis not present

## 2022-06-10 DIAGNOSIS — I480 Paroxysmal atrial fibrillation: Secondary | ICD-10-CM | POA: Diagnosis not present

## 2022-06-11 DIAGNOSIS — I480 Paroxysmal atrial fibrillation: Secondary | ICD-10-CM | POA: Diagnosis not present

## 2022-06-12 DIAGNOSIS — I480 Paroxysmal atrial fibrillation: Secondary | ICD-10-CM | POA: Diagnosis not present

## 2022-06-13 DIAGNOSIS — I480 Paroxysmal atrial fibrillation: Secondary | ICD-10-CM | POA: Diagnosis not present

## 2022-06-14 DIAGNOSIS — I480 Paroxysmal atrial fibrillation: Secondary | ICD-10-CM | POA: Diagnosis not present

## 2022-06-15 ENCOUNTER — Ambulatory Visit (INDEPENDENT_AMBULATORY_CARE_PROVIDER_SITE_OTHER): Payer: Medicaid Other

## 2022-06-15 DIAGNOSIS — J309 Allergic rhinitis, unspecified: Secondary | ICD-10-CM

## 2022-06-15 DIAGNOSIS — I480 Paroxysmal atrial fibrillation: Secondary | ICD-10-CM | POA: Diagnosis not present

## 2022-06-15 NOTE — Telephone Encounter (Signed)
Holding until mid January 2024

## 2022-06-16 DIAGNOSIS — I480 Paroxysmal atrial fibrillation: Secondary | ICD-10-CM | POA: Diagnosis not present

## 2022-06-17 ENCOUNTER — Other Ambulatory Visit: Payer: Self-pay | Admitting: Family Medicine

## 2022-06-17 DIAGNOSIS — U071 COVID-19: Secondary | ICD-10-CM

## 2022-06-17 DIAGNOSIS — I480 Paroxysmal atrial fibrillation: Secondary | ICD-10-CM | POA: Diagnosis not present

## 2022-06-17 NOTE — Telephone Encounter (Signed)
Requested medication (s) are due for refill today: review  Requested medication (s) are on the active medication list: no  Last refill:  05/24/22  Future visit scheduled: yes  Notes to clinic:  not delegated. Pt requesting refill on rx. Please advise      Requested Prescriptions  Pending Prescriptions Disp Refills   predniSONE (DELTASONE) 20 MG tablet [Pharmacy Med Name: PREDNISONE 20 MG TABLET] 5 tablet 0    Sig: TAKE 1 TABLET BY MOUTH EVERY DAY WITH BREAKFAST     Not Delegated - Endocrinology:  Oral Corticosteroids Failed - 06/17/2022 10:17 AM      Failed - This refill cannot be delegated      Failed - Manual Review: Eye exam for IOP if prolonged treatment      Failed - Glucose (serum) in normal range and within 180 days    Glucose, Bld  Date Value Ref Range Status  05/26/2022 395 (H) 70 - 99 mg/dL Final    Comment:    Glucose reference range applies only to samples taken after fasting for at least 8 hours.   POC Glucose  Date Value Ref Range Status  04/26/2021 120 (A) 70 - 99 mg/dl Final   Glucose-Capillary  Date Value Ref Range Status  12/11/2015 122 (H) 65 - 99 mg/dL Final         Failed - Na in normal range and within 180 days    Sodium  Date Value Ref Range Status  05/26/2022 134 (L) 135 - 145 mmol/L Final  04/15/2022 141 134 - 144 mmol/L Final         Failed - Last BP in normal range    BP Readings from Last 1 Encounters:  05/31/22 (!) 148/90         Failed - Bone Mineral Density or Dexa Scan completed in the last 2 years      Passed - K in normal range and within 180 days    Potassium  Date Value Ref Range Status  05/26/2022 3.8 3.5 - 5.1 mmol/L Final         Passed - Valid encounter within last 6 months    Recent Outpatient Visits           3 months ago Ganglion cyst of dorsum of right wrist   [place], [place], [place]   3 months ago COVID-19 virus infection   [place], [place], MD   8 months ago Type 2 diabetes mellitus with other specified complication, without long-term current use of insulin ([place])   [place], [place], MD   1 year ago Type 2 diabetes mellitus with other specified complication, without long-term current use of insulin ([place])   [place], [place], MD   1 year ago Type 2 diabetes mellitus with hyperglycemia, without long-term current use of insulin ([place])   [place], Charlane Ferretti, MD       Future Appointments             In 4 days Charlott Rakes, MD [place]   In 1 month [place], MD [place]   In 5 months [place], Donnamarie Poag, MD [facility] [place]

## 2022-06-18 DIAGNOSIS — I480 Paroxysmal atrial fibrillation: Secondary | ICD-10-CM | POA: Diagnosis not present

## 2022-06-19 DIAGNOSIS — I480 Paroxysmal atrial fibrillation: Secondary | ICD-10-CM | POA: Diagnosis not present

## 2022-06-20 DIAGNOSIS — I480 Paroxysmal atrial fibrillation: Secondary | ICD-10-CM | POA: Diagnosis not present

## 2022-06-21 ENCOUNTER — Ambulatory Visit: Payer: Medicaid Other | Admitting: Family Medicine

## 2022-06-21 DIAGNOSIS — I480 Paroxysmal atrial fibrillation: Secondary | ICD-10-CM | POA: Diagnosis not present

## 2022-06-22 ENCOUNTER — Telehealth: Payer: Self-pay | Admitting: Orthopedic Surgery

## 2022-06-22 DIAGNOSIS — I480 Paroxysmal atrial fibrillation: Secondary | ICD-10-CM | POA: Diagnosis not present

## 2022-06-22 NOTE — Telephone Encounter (Signed)
Pt came to office to request refill on medication (HYDROcodone-acetaminophen (NORCO/VICODIN) 5-325 MG tablet)... Pt stated that the pain is unbearable... Pt requesting callback

## 2022-06-22 NOTE — Telephone Encounter (Signed)
Looks like his pain management referral was denied, can we send him somewhere else for pain management

## 2022-06-23 ENCOUNTER — Other Ambulatory Visit: Payer: Self-pay | Admitting: Surgical

## 2022-06-23 ENCOUNTER — Ambulatory Visit (HOSPITAL_COMMUNITY)
Admission: RE | Admit: 2022-06-23 | Discharge: 2022-06-23 | Disposition: A | Discharge status: Home / Self Care | Payer: Medicaid Other | Source: Ambulatory Visit | Attending: Physician Assistant | Admitting: Physician Assistant

## 2022-06-23 ENCOUNTER — Ambulatory Visit (INDEPENDENT_AMBULATORY_CARE_PROVIDER_SITE_OTHER): Payer: Medicaid Other

## 2022-06-23 ENCOUNTER — Other Ambulatory Visit: Payer: Self-pay | Admitting: Family Medicine

## 2022-06-23 DIAGNOSIS — J309 Allergic rhinitis, unspecified: Secondary | ICD-10-CM

## 2022-06-23 DIAGNOSIS — I480 Paroxysmal atrial fibrillation: Secondary | ICD-10-CM | POA: Diagnosis not present

## 2022-06-23 LAB — COMPREHENSIVE METABOLIC PANEL
ALT: 13 U/L (ref 0–44)
AST: 16 U/L (ref 15–41)
Albumin: 3.2 g/dL — ABNORMAL LOW (ref 3.5–5.0)
Alkaline Phosphatase: 110 U/L (ref 38–126)
Anion gap: 8 (ref 5–15)
BUN: <5 mg/dL — ABNORMAL LOW (ref 8–23)
CO2: 22 mmol/L (ref 22–32)
Calcium: 8.9 mg/dL (ref 8.9–10.3)
Chloride: 110 mmol/L (ref 98–111)
Creatinine, Ser: 0.97 mg/dL (ref 0.61–1.24)
GFR, Estimated: >60 mL/min (ref >60)
Glucose, Bld: 131 mg/dL — ABNORMAL HIGH (ref 70–99)
Potassium: 3.5 mmol/L (ref 3.5–5.1)
Sodium: 140 mmol/L (ref 135–145)
Total Bilirubin: 0.4 mg/dL (ref 0.3–1.2)
Total Protein: 6.4 g/dL — ABNORMAL LOW (ref 6.5–8.1)

## 2022-06-23 MED ORDER — HYDROCODONE-ACETAMINOPHEN 5-325 MG PO TABS: 1.0000 | ORAL_TABLET | Freq: Two times a day (BID) | ORAL | 0 refills | Status: DC | PRN

## 2022-06-23 NOTE — Telephone Encounter (Signed)
I sent in refill but I will not refill it after this. Only refilled this one time as his initial pain management referral was denied

## 2022-06-24 DIAGNOSIS — I480 Paroxysmal atrial fibrillation: Secondary | ICD-10-CM | POA: Diagnosis not present

## 2022-06-24 NOTE — Telephone Encounter (Signed)
LMVM advising

## 2022-06-25 DIAGNOSIS — I480 Paroxysmal atrial fibrillation: Secondary | ICD-10-CM | POA: Diagnosis not present

## 2022-06-26 DIAGNOSIS — I480 Paroxysmal atrial fibrillation: Secondary | ICD-10-CM | POA: Diagnosis not present

## 2022-06-27 ENCOUNTER — Telehealth: Payer: Self-pay | Admitting: Cardiology

## 2022-06-27 ENCOUNTER — Telehealth: Payer: Self-pay | Admitting: Orthopedic Surgery

## 2022-06-27 DIAGNOSIS — I480 Paroxysmal atrial fibrillation: Secondary | ICD-10-CM | POA: Diagnosis not present

## 2022-06-27 DIAGNOSIS — I10 Essential (primary) hypertension: Secondary | ICD-10-CM

## 2022-06-27 MED ORDER — DILTIAZEM HCL ER COATED BEADS 180 MG PO CP24: ORAL_CAPSULE | ORAL | 2 refills | Status: DC

## 2022-06-27 MED ORDER — APIXABAN 5 MG PO TABS: 5.0000 mg | ORAL_TABLET | Freq: Two times a day (BID) | ORAL | 5 refills | Status: DC

## 2022-06-27 MED ORDER — CARVEDILOL 25 MG PO TABS: 25.0000 mg | ORAL_TABLET | Freq: Two times a day (BID) | ORAL | 2 refills | Status: DC

## 2022-06-27 MED ORDER — AMIODARONE HCL 200 MG PO TABS: 200.0000 mg | ORAL_TABLET | Freq: Every day | ORAL | 2 refills | Status: DC

## 2022-06-27 NOTE — Telephone Encounter (Signed)
IC advised sent on 12/07

## 2022-06-27 NOTE — Telephone Encounter (Signed)
Patient is calling requesting a callback from a nurse regarding not being able to get a medication due to waiting on PA. He was unable to give the name of the medication he is calling about due to stating the label had faded off. Please advise.

## 2022-06-27 NOTE — Telephone Encounter (Signed)
Patient called. Says he was denied at the pain clinic. Would like some hydrocodone called in for him. His call back number is [phone]

## 2022-06-27 NOTE — Telephone Encounter (Signed)
Pt reports he needs a prior auth for Prednisone.  Informed pt that I am unaware why PA needed for that medication, but advised pt to call and discuss this with his PCP. Informed patient of results and verbal understanding expressed. Sent in refills as requested by pt.

## 2022-06-28 DIAGNOSIS — I480 Paroxysmal atrial fibrillation: Secondary | ICD-10-CM | POA: Diagnosis not present

## 2022-06-29 ENCOUNTER — Other Ambulatory Visit: Payer: Self-pay | Admitting: Physical Medicine and Rehabilitation

## 2022-06-29 ENCOUNTER — Telehealth: Payer: Self-pay | Admitting: Physical Medicine and Rehabilitation

## 2022-06-29 DIAGNOSIS — I480 Paroxysmal atrial fibrillation: Secondary | ICD-10-CM | POA: Diagnosis not present

## 2022-06-29 NOTE — Telephone Encounter (Signed)
Pt called stating PA Megan sent in hydrocodone and the [facility] need pre auth to release medication. Please send pre auth and call pt when sent in. Pt phone number is [phone].

## 2022-06-30 DIAGNOSIS — I480 Paroxysmal atrial fibrillation: Secondary | ICD-10-CM | POA: Diagnosis not present

## 2022-07-01 DIAGNOSIS — I480 Paroxysmal atrial fibrillation: Secondary | ICD-10-CM | POA: Diagnosis not present

## 2022-07-02 DIAGNOSIS — I480 Paroxysmal atrial fibrillation: Secondary | ICD-10-CM | POA: Diagnosis not present

## 2022-07-03 DIAGNOSIS — I480 Paroxysmal atrial fibrillation: Secondary | ICD-10-CM | POA: Diagnosis not present

## 2022-07-04 ENCOUNTER — Telehealth: Payer: Self-pay | Admitting: Orthopedic Surgery

## 2022-07-04 DIAGNOSIS — I480 Paroxysmal atrial fibrillation: Secondary | ICD-10-CM | POA: Diagnosis not present

## 2022-07-04 NOTE — Telephone Encounter (Signed)
Pt called requesting a neck brace to help with his pain and also asking for a refill of hydrocodone. Pt states he is in sever pains. Please sen to pharmacy on file. Pt phone number is [phone].

## 2022-07-05 ENCOUNTER — Telehealth: Payer: Self-pay | Admitting: Cardiology

## 2022-07-05 ENCOUNTER — Other Ambulatory Visit: Payer: Self-pay | Admitting: Orthopedic Surgery

## 2022-07-05 ENCOUNTER — Ambulatory Visit (INDEPENDENT_AMBULATORY_CARE_PROVIDER_SITE_OTHER): Payer: Medicaid Other

## 2022-07-05 ENCOUNTER — Other Ambulatory Visit: Payer: Self-pay | Admitting: Surgical

## 2022-07-05 DIAGNOSIS — I480 Paroxysmal atrial fibrillation: Secondary | ICD-10-CM | POA: Diagnosis not present

## 2022-07-05 DIAGNOSIS — R2231 Localized swelling, mass and lump, right upper limb: Secondary | ICD-10-CM | POA: Diagnosis not present

## 2022-07-05 DIAGNOSIS — J309 Allergic rhinitis, unspecified: Secondary | ICD-10-CM

## 2022-07-05 MED ORDER — HYDROCODONE-ACETAMINOPHEN 5-325 MG PO TABS: 1.0000 | ORAL_TABLET | Freq: Two times a day (BID) | ORAL | 0 refills | Status: DC | PRN

## 2022-07-05 NOTE — Telephone Encounter (Signed)
I called patient and advised.  He would like to know if he can get a cervical collar to hopefully help him to sleep. The pain is so severe that he is unable to get any rest. Please advise.

## 2022-07-05 NOTE — Telephone Encounter (Signed)
Patient with diagnosis of A Fib on Eliquis for anticoagulation.  Had ablation on 05/09/22.  Ninety days from ablation date is 07/27/22.  Procedure: Excision mass right forearm  Date of procedure: 08/08/21  CHA2DS2-VASc Score = 5  This indicates a 7.2% annual risk of stroke. The patient's score is based upon: CHF History: 0 HTN History: 1 Diabetes History: 1 Stroke History: 2 Vascular Disease History: 1 Age Score: 0 Gender Score: 0    CrCl 120 mL/min Platelet count 168K  Due to history of stroke and ablation, will route to Dr Curt Bears for input  **This guidance is not considered finalized until pre-operative APP has relayed final recommendations.**

## 2022-07-05 NOTE — Telephone Encounter (Signed)
Patient with diagnosis of A Fib on Eliquis for anticoagulation.  Had ablation on 05/09/22.  Ninety days from ablation date is 07/27/22.   Procedure: Excision mass right forearm  Date of procedure: 08/08/21   CHA2DS2-VASc Score = 5  This indicates a 7.2% annual risk of stroke. The patient's score is based upon: CHF History: 0 HTN History: 1 Diabetes History: 1 Stroke History: 2 Vascular Disease History: 1 Age Score: 0 Gender Score: 0     CrCl 120 mL/min Platelet count 168K   Patient can hold Eliquis for 2 days pre procedure

## 2022-07-05 NOTE — Telephone Encounter (Signed)
   Pre-operative Risk Assessment    Patient Name: Jason Stokes  DOB: 12/31/1960 MRN: [id number]      Request for Surgical Clearance    Procedure:   Excision mass right forearm   Date of Surgery:  Clearance 08/08/21                                 Surgeon:  Dr. Threasa Beards Group or Practice Name:  [facility]  Phone number:  [phone]  Fax number:  [phone]    Type of Clearance Requested:   - Medical  - Pharmacy:  Hold Apixaban (Eliquis) "Dr. Fredna Dow wants them to determine how many days pt can hold"    Type of Anesthesia:   choice    Additional requests/questions:      Dorthey Sawyer   07/05/2022, 10:32 AM

## 2022-07-05 NOTE — Telephone Encounter (Signed)
I will refill since he is waiting to hear back from pain mgmt

## 2022-07-05 NOTE — Telephone Encounter (Signed)
   Name: Jason Stokes  DOB: Jan 24, 1961  MRN: [id number]  Primary Cardiologist: Will [name], MD  Chart reviewed as part of pre-operative protocol coverage. Spoke with pt. He is post ablation 04/2022. The patient has an upcoming visit scheduled with Dr. Curt Bears on 08/02/2022 at which time clearance can be addressed in case there are any issues that would impact surgical recommendations.   Excision mass R forearm is not scheduled until 08/08/2022 as below. I added preop FYI to appointment note so that provider is aware to address at time of outpatient visit.  Per office protocol the cardiology provider should forward their finalized clearance decision and recommendations regarding antiplatelet therapy to the requesting party below.    I will route this message as FYI to requesting party and remove this message from the preop box as separate preop APP input not needed at this time.   Please call with any questions.  Lenna Sciara, NP  07/05/2022, 4:19 PM

## 2022-07-06 DIAGNOSIS — I480 Paroxysmal atrial fibrillation: Secondary | ICD-10-CM | POA: Diagnosis not present

## 2022-07-07 DIAGNOSIS — I480 Paroxysmal atrial fibrillation: Secondary | ICD-10-CM | POA: Diagnosis not present

## 2022-07-07 NOTE — Telephone Encounter (Signed)
IC LMVM for patient to call me back to discuss.

## 2022-07-07 NOTE — Telephone Encounter (Signed)
Did not know if you wanted to call patient so you can tell him when is a good time to come and get the collar if he would like.

## 2022-07-07 NOTE — Telephone Encounter (Signed)
I think it is okay for him to have a cervical collar but if he is having such severe pain that he is unable to sleep, he may need to consider undergoing the cervical spine ESI that he discussed with Jinny Blossom a couple months back.

## 2022-07-08 DIAGNOSIS — I480 Paroxysmal atrial fibrillation: Secondary | ICD-10-CM | POA: Diagnosis not present

## 2022-07-09 DIAGNOSIS — I480 Paroxysmal atrial fibrillation: Secondary | ICD-10-CM | POA: Diagnosis not present

## 2022-07-10 DIAGNOSIS — I480 Paroxysmal atrial fibrillation: Secondary | ICD-10-CM | POA: Diagnosis not present

## 2022-07-12 DIAGNOSIS — I480 Paroxysmal atrial fibrillation: Secondary | ICD-10-CM | POA: Diagnosis not present

## 2022-07-13 DIAGNOSIS — I480 Paroxysmal atrial fibrillation: Secondary | ICD-10-CM | POA: Diagnosis not present

## 2022-07-14 DIAGNOSIS — I480 Paroxysmal atrial fibrillation: Secondary | ICD-10-CM | POA: Diagnosis not present

## 2022-07-15 DIAGNOSIS — I480 Paroxysmal atrial fibrillation: Secondary | ICD-10-CM | POA: Diagnosis not present

## 2022-07-16 ENCOUNTER — Emergency Department (HOSPITAL_COMMUNITY)
Admission: EM | Admit: 2022-07-16 | Discharge: 2022-07-16 | Disposition: A | Discharge status: Home / Self Care | Payer: Medicaid Other | Attending: Emergency Medicine | Admitting: Emergency Medicine

## 2022-07-16 ENCOUNTER — Encounter (HOSPITAL_COMMUNITY): Payer: Self-pay

## 2022-07-16 ENCOUNTER — Emergency Department (HOSPITAL_COMMUNITY): Payer: Medicaid Other

## 2022-07-16 DIAGNOSIS — Z7951 Long term (current) use of inhaled steroids: Secondary | ICD-10-CM | POA: Insufficient documentation

## 2022-07-16 DIAGNOSIS — I10 Essential (primary) hypertension: Secondary | ICD-10-CM | POA: Diagnosis not present

## 2022-07-16 DIAGNOSIS — J45909 Unspecified asthma, uncomplicated: Secondary | ICD-10-CM | POA: Insufficient documentation

## 2022-07-16 DIAGNOSIS — Z7901 Long term (current) use of anticoagulants: Secondary | ICD-10-CM | POA: Diagnosis not present

## 2022-07-16 DIAGNOSIS — M542 Cervicalgia: Secondary | ICD-10-CM | POA: Insufficient documentation

## 2022-07-16 DIAGNOSIS — Z79899 Other long term (current) drug therapy: Secondary | ICD-10-CM | POA: Insufficient documentation

## 2022-07-16 DIAGNOSIS — I480 Paroxysmal atrial fibrillation: Secondary | ICD-10-CM | POA: Diagnosis not present

## 2022-07-16 MED ORDER — PREDNISONE 10 MG PO TABS: 20.0000 mg | ORAL_TABLET | Freq: Every day | ORAL | 0 refills | Status: DC

## 2022-07-16 MED ORDER — LIDOCAINE 5 % EX PTCH
1.0000 | MEDICATED_PATCH | CUTANEOUS | Status: DC
  Administered 2022-07-16: 1 via TRANSDERMAL
  Filled 2022-07-16: qty 1

## 2022-07-16 MED ORDER — MORPHINE SULFATE (PF) 4 MG/ML IV SOLN
4.0000 mg | Freq: Once | INTRAVENOUS | Status: AC
  Administered 2022-07-16: 4 mg via INTRAVENOUS
  Filled 2022-07-16: qty 1

## 2022-07-16 MED ORDER — FENTANYL CITRATE PF 50 MCG/ML IJ SOSY: 50.0000 ug | PREFILLED_SYRINGE | Freq: Once | INTRAMUSCULAR | Status: DC

## 2022-07-16 MED ORDER — OXYCODONE-ACETAMINOPHEN 5-325 MG PO TABS: 1.0000 | ORAL_TABLET | Freq: Four times a day (QID) | ORAL | 0 refills | Status: DC | PRN

## 2022-07-16 MED ORDER — ONDANSETRON HCL 4 MG/2ML IJ SOLN
4.0000 mg | Freq: Once | INTRAMUSCULAR | Status: AC
  Administered 2022-07-16: 4 mg via INTRAVENOUS
  Filled 2022-07-16: qty 2

## 2022-07-16 NOTE — ED Notes (Signed)
Reports right sided neck and back pain. Both rated at a 10/10 pain.

## 2022-07-16 NOTE — ED Provider Notes (Signed)
[facility] EMERGENCY DEPARTMENT Provider Note   CSN: [id number] Arrival date & time: 07/16/22  1112     History  Chief Complaint  Patient presents with   Neck Pain    Jason Stokes is a 61 y.o. male.   Neck Pain    Patient with medical history of asthma, PAF on Eliquis, chronic low back pain, bipolar, hypertension, lipidemia, vaso-occlusive disease presents to the emergency department due to neck pain.  The neck pain has been going on for "a long time".  It worsened 3 to 4 days ago atraumatically.  The pain is worse with any movement, he feels in between C3 and C5 but also now on the right side of his neck anteriorly.  Denies any vision changes, he did feel that his right upper extremity was somewhat weaker secondary to pain with raising his arm upwards.  Denies any chest pain or shortness of breath.  No lower extremity weakness or numbness.  No paresthesias.  Has tried home narcotic medicine with no improvement of symptoms.  Home Medications Prior to Admission medications   Medication Sig Start Date End Date Taking? Authorizing Provider  oxyCODONE-acetaminophen (PERCOCET/ROXICET) 5-325 MG tablet Take 1 tablet by mouth every 6 (six) hours as needed for severe pain. 07/16/22  Yes Sherrill Raring, PA-C  albuterol (VENTOLIN HFA) 108 (90 Base) MCG/ACT inhaler Inhale 2 puffs into the lungs every 4 (four) hours as needed for wheezing or shortness of breath. 05/31/22   Kozlow, Donnamarie Poag, MD  amiodarone (PACERONE) 200 MG tablet Take 1 tablet (200 mg total) by mouth daily. 06/27/22   Camnitz, Ocie Doyne, MD  apixaban (ELIQUIS) 5 MG TABS tablet Take 1 tablet (5 mg total) by mouth 2 (two) times daily. 06/27/22   Camnitz, Ocie Doyne, MD  atorvastatin (LIPITOR) 40 MG tablet Take 1 tablet (40 mg total) by mouth daily. 03/16/22   Argentina Donovan, PA-C  azaTHIOprine (IMURAN) 50 MG tablet Take 100 mg by mouth daily.    [provider]  azelastine (ASTELIN) 0.1 % nasal spray  1 spray each nostril twice a day 05/31/22   Kozlow, Donnamarie Poag, MD  brimonidine (ALPHAGAN) 0.2 % ophthalmic solution Place 1 drop into both eyes every 8 (eight) hours.    [provider]  carvedilol (COREG) 25 MG tablet Take 1 tablet (25 mg total) by mouth 2 (two) times daily. 06/27/22   Camnitz, Ocie Doyne, MD  cetirizine (ZYRTEC) 10 MG tablet Take 1 tablet (10 mg total) by mouth 2 (two) times daily as needed for allergies (Can take an extra dose during flare ups.). 11/23/21   Kozlow, Donnamarie Poag, MD  diazepam (VALIUM) 5 MG tablet Take one tablet by mouth with food one hour prior to procedure. May repeat 30 minutes prior if needed. 05/30/22   Lorine Bears, NP  diclofenac Sodium (VOLTAREN) 1 % GEL Apply 1 Application topically 4 (four) times daily as needed (pain).    [provider]  Difluprednate 0.05 % EMUL Place 1 drop into both eyes 2 (two) times daily.    [provider]  diltiazem (CARDIZEM CD) 180 MG 24 hr capsule TAKE 1 CAPSULE BY MOUTH EVERY DAY 06/27/22   Camnitz, Ocie Doyne, MD  dorzolamide-timolol (COSOPT) 22.3-6.8 MG/ML ophthalmic solution Place 1 drop into both eyes 2 (two) times daily.    [provider]  EPINEPHrine 0.3 mg/0.3 mL IJ SOAJ injection Inject 0.3 mg into the muscle as needed for anaphylaxis. 02/10/22   Allena Katz  J, MD  fluticasone (FLONASE) 50 MCG/ACT nasal spray 1 spray each nostril twice a day 05/31/22   Kozlow, Donnamarie Poag, MD  fluticasone furoate-vilanterol (BREO ELLIPTA) 200-25 MCG/ACT AEPB Inhale 1 puff into the lungs daily. 05/31/22   Kozlow, Donnamarie Poag, MD  folic acid (FOLVITE) 1 MG tablet Take 1 mg by mouth daily. 10/16/20   [provider]  GARLIC PO Take 1 tablet by mouth daily.    [provider]  hydrALAZINE (APRESOLINE) 100 MG tablet TAKE 1 TABLET (100 MG TOTAL) BY MOUTH 3 (THREE) TIMES DAILY. 03/20/20   Camnitz, Ocie Doyne, MD  methocarbamol (ROBAXIN) 500 MG tablet Take 1 tablet (500 mg total) by mouth every 8 (eight)  hours as needed for muscle spasms. 04/27/22   Meredith Pel, MD  methotrexate (RHEUMATREX) 2.5 MG tablet Take 20 mg by mouth every Monday. 11/11/20   [provider]  [place]. Devices MISC Back brace. Diagnosis chronic back pain 06/16/20   Charlott Rakes, MD  Misc. Devices MISC Left knee brace. Diagnosis L knee pain 06/16/20   Charlott Rakes, MD  montelukast (SINGULAIR) 10 MG tablet Take 1 tablet (10 mg total) by mouth at bedtime. 05/31/22   Kozlow, Donnamarie Poag, MD  Olopatadine HCl (PATADAY) 0.2 % SOLN Place 1 drop into both eyes daily as needed. 11/23/21   Kozlow, Donnamarie Poag, MD  omeprazole (PRILOSEC) 20 MG capsule TAKE 1 CAPSULE BY MOUTH EVERY DAY 06/23/22   Charlott Rakes, MD  predniSONE (DELTASONE) 20 MG tablet TAKE 1 TABLET BY MOUTH EVERY DAY WITH BREAKFAST 06/20/22   Charlott Rakes, MD  Vitamin D3 (VITAMIN D) 25 MCG tablet Take 1,000 Units by mouth daily.    [provider]      Allergies    Lisinopril and Tylenol [acetaminophen]    Review of Systems   Review of Systems  Musculoskeletal:  Positive for neck pain.    Physical Exam Updated Vital Signs BP (!) 140/94 (BP Location: Left Arm)   Pulse 79   Temp 97.9 F (36.6 C) (Oral)   Resp 18   Ht '6\' 1"'$  (1.854 m)   Wt 106.6 kg   SpO2 97%   BMI 31.00 kg/m  Physical Exam Vitals and nursing note reviewed. Exam conducted with a chaperone present.  Constitutional:      Appearance: Normal appearance.  HENT:     Head: Normocephalic and atraumatic.  Eyes:     General: No scleral icterus.       Right eye: No discharge.        Left eye: No discharge.     Extraocular Movements: Extraocular movements intact.     Pupils: Pupils are equal, round, and reactive to light.  Neck:     Comments: Midline tenderness along C3-C5.  No palpable step-offs or crepitus.  Able to range the neck but reproducible pain with ROM Cardiovascular:     Rate and Rhythm: Normal rate and regular rhythm.     Pulses: Normal pulses.     Heart sounds:  Normal heart sounds.     No friction rub. No gallop.  Pulmonary:     Effort: Pulmonary effort is normal. No respiratory distress.     Breath sounds: Normal breath sounds.  Abdominal:     General: Abdomen is flat. Bowel sounds are normal. There is no distension.     Palpations: Abdomen is soft.     Tenderness: There is no abdominal tenderness.  Musculoskeletal:     Cervical back: Normal range of  motion. Tenderness present.  Skin:    General: Skin is warm and dry.     Coloration: Skin is not jaundiced.  Neurological:     Mental Status: He is alert. Mental status is at baseline.     Coordination: Coordination normal.     Comments: Cranial nerves II through XII are grossly intact.  Upper and lower extremity strength is symmetric bilaterally.  Ambulatory with steady gait.     ED Results / Procedures / Treatments   Labs (all labs ordered are listed, but only abnormal results are displayed) Labs Reviewed - No data to display   EKG None  Radiology CT Cervical Spine Wo Contrast  Result Date: 07/16/2022 CLINICAL DATA:  61 year old male with chronic neck pain EXAM: CT CERVICAL SPINE WITHOUT CONTRAST TECHNIQUE: Multidetector CT imaging of the cervical spine was performed without intravenous contrast. Multiplanar CT image reconstructions were also generated. RADIATION DOSE REDUCTION: This exam was performed according to the departmental dose-optimization program which includes automated exposure control, adjustment of the mA and/or kV according to patient size and/or use of iterative reconstruction technique. COMPARISON:  05/15/2022 MR, 12/06/2020 CT and prior studies FINDINGS: Alignment: Normal. Skull base and vertebrae: No acute fracture. No primary bone lesion or focal pathologic process. Soft tissues and spinal canal: No prevertebral fluid or swelling. No visible canal hematoma. Disc levels: C2-3: Mild osteophytic spurring. C3-4: Disc space narrowing and spondylosis contribute to mild  central spinal and mild to moderate LEFT bony foraminal narrowing. C4-5: Mild degenerative disc disease/spondylosis contribute to mild LEFT bony foraminal narrowing. C5-6: Moderate degenerative disc disease and spondylosis contribute to mild LEFT bony foraminal narrowing. C6-7: No significant abnormality C7-T1: No significant abnormality Upper chest: No acute abnormality Other: None IMPRESSION: 1. No evidence of acute abnormality. 2. Unchanged multilevel degenerative changes as described above. Electronically Signed   By: Margarette Canada M.D.   On: 07/16/2022 13:22    Procedures Procedures    Medications Ordered in ED Medications  lidocaine (LIDODERM) 5 % 1 patch (1 patch Transdermal Patch Applied 07/16/22 1239)  morphine (PF) 4 MG/ML injection 4 mg (4 mg Intravenous Given 07/16/22 1240)  ondansetron (ZOFRAN) injection 4 mg (4 mg Intravenous Given 07/16/22 1241)    ED Course/ Medical Decision Making/ A&P                           Medical Decision Making Amount and/or Complexity of Data Reviewed Labs: ordered. Radiology: ordered.  Risk Prescription drug management.   Patient presented to the emergency department due to neck pain.  Differential includes but not mended to fracture, dislocation, acute on chronic exacerbation of cervical stenosis, vascular occlusion, radiculopathy.  On exam patient is neurovascular intact with normal neuroexam.  Upper and lower extremity strength symmetric bilaterally, he does have reproducible tenderness to the cervical spine along C3 and C4 consistent with more of muscle strain or bony abnormality rather than a vascular process or neurologic process.  Will order CT cervical spine and reassess.  Cervical spine is consistent with chronic stenosis as well as degenerative changes.  I discussed the results with the patient and gave him copy of the scan.  Pain improved after the morphine.  Will then follow-up closely with his neurosurgical group, I do not see any  need for additional evaluation.  Return precautions were discussed with the patient who verbalized understanding and agreement with the plan.  Discussed HPI, physical exam and plan of care for this patient with  attending Gareth Morgan. The attending physician evaluated this patient as part of a shared visit and agrees with plan of care.         Final Clinical Impression(s) / ED Diagnoses Final diagnoses:  Neck pain    Rx / DC Orders ED Discharge Orders          Ordered    oxyCODONE-acetaminophen (PERCOCET/ROXICET) 5-325 MG tablet  Every 6 hours PRN        07/16/22 1410              Sherrill Raring, PA-C 07/16/22 1411    Gareth Morgan, MD 07/17/22 0000

## 2022-07-16 NOTE — Discharge Instructions (Addendum)
You are seen today in the emergency department for neck pain.  Your scan shows signs narrowing which we discussed.  You can take the pain medicine as needed for severe pain.  Follow-up with your neurosurgeon for additional evaluation.  Return to the ED for loss of vision, double vision or new or concerning symptoms including weakness on one side of your body, chest pain or shortness of breath.    Narrative  CLINICAL DATA:  61 year old male with chronic neck pain    EXAM:  CT CERVICAL SPINE WITHOUT CONTRAST    TECHNIQUE:  Multidetector CT imaging of the cervical spine was performed without  intravenous contrast. Multiplanar CT image reconstructions were also  generated.    RADIATION DOSE REDUCTION: This exam was performed according to the  departmental dose-optimization program which includes automated  exposure control, adjustment of the mA and/or kV according to  patient size and/or use of iterative reconstruction technique.    COMPARISON:  05/15/2022 MR, 12/06/2020 CT and prior studies    FINDINGS:  Alignment: Normal.    Skull base and vertebrae: No acute fracture. No primary bone lesion  or focal pathologic process.    Soft tissues and spinal canal: No prevertebral fluid or swelling. No  visible canal hematoma.    Disc levels:    C2-3: Mild osteophytic spurring.    C3-4: Disc space narrowing and spondylosis contribute to mild  central spinal and mild to moderate LEFT bony foraminal narrowing.    C4-5: Mild degenerative disc disease/spondylosis contribute to mild  LEFT bony foraminal narrowing.    C5-6: Moderate degenerative disc disease and spondylosis contribute  to mild LEFT bony foraminal narrowing.    C6-7: No significant abnormality    C7-T1: No significant abnormality    Upper chest: No acute abnormality    Other: None    IMPRESSION:  1. No evidence of acute abnormality.  2. Unchanged multilevel degenerative changes as described above.

## 2022-07-16 NOTE — ED Triage Notes (Signed)
Patient arrived to the ED with complaints of neck and back pain. Patient states the neck pain started a month ago. Patient states the pain started on the left side, but now is on the right side. Patient states that he has had previous injections in his back. Patient is A&Ox4.

## 2022-07-17 DIAGNOSIS — I480 Paroxysmal atrial fibrillation: Secondary | ICD-10-CM | POA: Diagnosis not present

## 2022-07-18 DIAGNOSIS — I480 Paroxysmal atrial fibrillation: Secondary | ICD-10-CM | POA: Diagnosis not present

## 2022-07-19 ENCOUNTER — Telehealth: Payer: Self-pay | Admitting: Cardiology

## 2022-07-19 DIAGNOSIS — I480 Paroxysmal atrial fibrillation: Secondary | ICD-10-CM | POA: Diagnosis not present

## 2022-07-19 NOTE — Telephone Encounter (Signed)
Left message to call back  

## 2022-07-19 NOTE — Telephone Encounter (Signed)
Patient calling to speak with a nurse about his recent neck pain. He says he was seen at the ED for it.

## 2022-07-20 DIAGNOSIS — I480 Paroxysmal atrial fibrillation: Secondary | ICD-10-CM | POA: Diagnosis not present

## 2022-07-20 NOTE — Telephone Encounter (Signed)
Pt calling in with a concern about new neck pain. He is not sure if it is muscle spasm related. He does have a  Muscle spasms Pinched nerve on the left side of my neck, but then switched to the other side, thought I was having a stroke and went to ED. He is wondering if there is further testing he needs as he does not feel they did all they should. Aware I will discuss w/ Dr. Curt Bears and let him know. Pt agreeable to plan.

## 2022-07-21 ENCOUNTER — Ambulatory Visit (INDEPENDENT_AMBULATORY_CARE_PROVIDER_SITE_OTHER): Payer: Medicaid Other | Admitting: Surgical

## 2022-07-21 ENCOUNTER — Encounter: Payer: Self-pay | Admitting: Surgical

## 2022-07-21 ENCOUNTER — Encounter: Payer: Self-pay | Admitting: *Deleted

## 2022-07-21 DIAGNOSIS — M1711 Unilateral primary osteoarthritis, right knee: Secondary | ICD-10-CM | POA: Diagnosis not present

## 2022-07-21 DIAGNOSIS — I480 Paroxysmal atrial fibrillation: Secondary | ICD-10-CM | POA: Diagnosis not present

## 2022-07-21 DIAGNOSIS — J3081 Allergic rhinitis due to animal (cat) (dog) hair and dander: Secondary | ICD-10-CM

## 2022-07-21 MED ORDER — BUPIVACAINE HCL 0.25 % IJ SOLN
4.0000 mL | INTRAMUSCULAR | Status: AC | PRN
  Administered 2022-07-21: 4 mL via INTRA_ARTICULAR

## 2022-07-21 MED ORDER — LIDOCAINE HCL 1 % IJ SOLN
5.0000 mL | INTRAMUSCULAR | Status: AC | PRN
  Administered 2022-07-21: 5 mL

## 2022-07-21 MED ORDER — METHYLPREDNISOLONE ACETATE 40 MG/ML IJ SUSP
40.0000 mg | INTRAMUSCULAR | Status: AC | PRN
  Administered 2022-07-21: 40 mg via INTRA_ARTICULAR

## 2022-07-21 NOTE — Progress Notes (Signed)
Office Visit Note   [name]           Date of Birth: February 03, 1961           MRN: [id number] Visit Date: 07/21/2022 Requested by: Charlott Rakes, MD [address],  [place] [address] PCP: Charlott Rakes, MD  Subjective: Chief Complaint  Patient presents with   Knee Pain    HPI: Jason Stokes is a 62 y.o. male who presents to the office reporting right knee pain.  Patient states he would like a right knee injection.  He has seen Dr. Ninfa Linden in the past for bilateral knee osteoarthritis.  Right knee bothers him more than the left and has more pronounced arthritis.  Most of his pain is in the medial aspect of the knee.  No new injury.  No fevers or chills.  He is able to bear weight on the extremity.  No recent increase in his typical pain aside from worsening pain as the weather gets colder.  He is planning on trying to pursue knee replacement surgery with Dr. Ninfa Linden in spring but not within the next 3 months..                ROS: All systems reviewed are negative as they relate to the chief complaint within the history of present illness.  Patient denies fevers or chills.  Assessment & Plan: Visit Diagnoses:  1. Unilateral primary osteoarthritis, right knee     Plan: Patient is a 62 year old male who has history of bilateral knee osteoarthritis, right greater than left.  He is here today with right knee pain and request aspiration/injection.  Right knee was aspirated of 37 cc of nonpurulent synovial fluid.  Cortisone injection administered without complication.  Plan is to see how patient does with this and then follow-up with Dr. Ninfa Linden in the future for consideration of knee replacement which is his goal for spring 2024.  Advised him that he cannot have surgery in this knee for 3 months after this injection and he understands.  Follow-up as needed.  Follow-Up Instructions: No follow-ups on file.   Orders:  No orders of the defined types were  placed in this encounter.  No orders of the defined types were placed in this encounter.     Procedures: Large Joint Inj: R knee on 07/21/2022 8:06 PM Indications: diagnostic evaluation, joint swelling and pain Details: 18 G 1.5 in needle, superolateral approach  Arthrogram: No  Medications: 5 mL lidocaine 1 %; 40 mg methylPREDNISolone acetate 40 MG/ML; 4 mL bupivacaine 0.25 % Aspirate: 37 mL Outcome: tolerated well, no immediate complications Procedure, treatment alternatives, risks and benefits explained, specific risks discussed. Consent was given by the patient. Immediately prior to procedure a time out was called to verify the correct patient, procedure, equipment, support staff and site/side marked as required. Patient was prepped and draped in the usual sterile fashion.       Clinical Data: No additional findings.  Objective: Vital Signs: There were no vitals taken for this visit.  Physical Exam:  Constitutional: Patient appears well-developed HEENT:  Head: Normocephalic Eyes:EOM are normal Neck: Normal range of motion Cardiovascular: Normal rate Pulmonary/chest: Effort normal Neurologic: Patient is alert Skin: Skin is warm Psychiatric: Patient has normal mood and affect  Ortho Exam: Ortho exam demonstrates right knee with 3 degrees extension and 120 degrees of knee flexion.  No calf tenderness.  Negative Homans' sign.  No pain with hip range of motion.  He has severe tenderness over the medial joint line with moderate tenderness over the lateral joint line.  Able to perform straight leg raise without extensor lag.  No cellulitis or skin changes noted.  Moderate effusion present.  Specialty Comments:  MRI CERVICAL SPINE WITHOUT CONTRAST   TECHNIQUE: Multiplanar, multisequence MR imaging of the cervical spine was performed. No intravenous contrast was administered.   COMPARISON:  Cervical spine CT 12/06/2020   FINDINGS: Alignment: Unremarkable   Vertebrae: No  fracture, evidence of discitis, or bone lesion.   Cord: Normal signal and morphology.   Posterior Fossa, vertebral arteries, paraspinal tissues: Suboccipital scarring. No perispinal mass or inflammation.   Disc levels:   C2-3: Asymmetric left facet spurring.  No neural impingement   C3-4: Disc space narrowing and bulging with endplate and uncovertebral ridging. Left foraminal impingement. A disc osteophyte complex mildly indents the ventral cord   C4-5: Disc narrowing and bulging. Asymmetric left facet spurring. Moderate left and mild right foraminal narrowing   C5-6: Disc space narrowing and bulging with uncovertebral ridging. Mild facet spurring. Moderate bilateral foraminal narrowing   IMPRESSION: 1. 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 Moderate right foraminal narrowing at C5-6. 2. Diffusely patent spinal canal.     Electronically Signed   By: Jorje Guild M.D.   On: 05/18/2022 10:22  Imaging: No results found.   PMFS History: Patient Active Problem List   Diagnosis Date Noted   Aortic atherosclerosis ([place]) 06/07/2022   Hypercoagulable state due to paroxysmal atrial fibrillation ([place]) 03/23/2021   Type 2 diabetes mellitus with hyperglycemia, without long-term current use of insulin ([place]) 02/10/2021   Neck pain on left side 03/06/2019   Musculoskeletal chest pain 07/24/2018   Asthma, mild intermittent 05/02/2018   Allergic rhinitis caused by mold 05/02/2018   Tobacco use 05/02/2018   Bilateral carpal tunnel syndrome 01/10/2018   Stroke ([place])    Schizophrenia ([place])    Migraine    History of nuclear stress test    History of alcohol abuse    GERD (gastroesophageal reflux disease)    Dysrhythmia    Chronic lower back pain    Anxiety    Arthritis of knee, right 04/12/2017   Bipolar disorder ([place]) 04/03/2017   Lipoma of forehead 09/22/2016   Trigger ring finger of right hand 06/22/2016   Paroxysmal atrial  fibrillation (H[facility])    AF (atrial fibrillation) ([place]) 12/10/2015   Eczema 07/10/2015   History of CVA (cerebrovascular accident) 05/04/2015   Tear of medial meniscus of right knee 03/18/2015   Chronic pain of right knee 03/12/2015   Other and unspecified hyperlipidemia 11/25/2013   Nasal congestion 11/25/2013   Sinusitis, chronic 05/09/2013   Chronic low back pain 10/12/2012   Lumbar radiculopathy 10/12/2012   Hypertension 10/12/2012   Depression 10/12/2012   Insomnia 10/12/2012   Past Medical History:  Diagnosis Date   Anxiety    Bilateral carpal tunnel syndrome 01/10/2018   Bipolar disorder ([place])    Chronic lower back pain    Depression    Dysrhythmia    a-fib   GERD (gastroesophageal reflux disease)    History of alcohol abuse    History of nuclear stress test    Myoview 10/16: EF 50%, diaphragmatic attenuation, no ischemia, low risk   Hypertension    Migraine 2012-2014   Moderate persistent asthma with acute exacerbation 05/02/2018   PAF (paroxysmal atrial fibrillation) ([place]) 03/27/2015   a. Myoview neg for ischemia >>  Flecainide started 10/16 >> FU ETT    Schizophrenia ([place])    Sciatica neuralgia    Small vessel disease (HCC)    Right basal ganglia stroke   Stroke [facility]) "between 2012-2014"   residual "AF" (09/22/2015)    Family History  Problem Relation Age of Onset   Heart disease Father    Schizophrenia Sister     Past Surgical History:  Procedure Laterality Date   ATRIAL FIBRILLATION ABLATION  09/22/2015   ATRIAL FIBRILLATION ABLATION N/A 04/28/2022   Procedure: ATRIAL FIBRILLATION ABLATION;  Surgeon: Constance Haw, MD;  Location: [place] [facility];  Service: Cardiovascular;  Laterality: N/A;   CYST EXCISION  1996-97   surgery back of head    ELECTROPHYSIOLOGIC STUDY N/A 09/22/2015   Procedure: Atrial Fibrillation Ablation;  Surgeon: Will [name], MD;  Location: [place];  Service: Cardiovascular;  Laterality: N/A;   ELECTROPHYSIOLOGIC  STUDY N/A 12/10/2015   Procedure: Atrial Fibrillation Ablation;  Surgeon: Will [name], MD;  Location: [facility];  Service: Cardiovascular;  Laterality: N/A;   ELECTROPHYSIOLOGIC STUDY N/A 12/11/2015   Procedure: Cardioversion;  Surgeon: Will [name], MD;  Location: [place];  Service: Cardiovascular;  Laterality: N/A;   EMBOLIZATION Right 08/19/2020   Procedure: EMBOLIZATION;  Surgeon: Cherre Robins, MD;  Location: [facility];  Service: Cardiovascular;  Laterality: Right;  hypogastric   EXCISION MASS HEAD N/A 01/06/2017   Procedure: EXCISION MASS FOREHEAD;  Surgeon: Irene Limbo, MD;  Location: [facility];  Service: Plastics;  Laterality: N/A;   GANGLION CYST EXCISION Left    INTERCOSTAL NERVE BLOCK  2005   KNEE ARTHROSCOPY Right 2016   MASS EXCISION N/A 01/25/2021   Procedure: EXCISION SUBCUTANEOUS VS SUBFASCIAL MASS TORSO 3CM;  Surgeon: Irene Limbo, MD;  Location: [facility];  Service: Plastics;  Laterality: N/A;   Social History   Occupational History    Comment: disabled  Tobacco Use   Smoking status: Every Day    Years: 29.00    Types: Cigarettes   Smokeless tobacco: Never   Tobacco comments:    1 cigarettes every day  06-02-2021  Vaping Use   Vaping Use: Never used  Substance and Sexual Activity   Alcohol use: Not Currently    Alcohol/week: 6.0 standard drinks of alcohol    Types: 6 Cans of beer per week    Comment: 6 pack weekly 05/26/22   Drug use: Yes    Types: Marijuana    Comment: smoked marijuana 01-02-17 for pain   Sexual activity: Not Currently

## 2022-07-22 ENCOUNTER — Ambulatory Visit (INDEPENDENT_AMBULATORY_CARE_PROVIDER_SITE_OTHER): Payer: Medicaid Other

## 2022-07-22 DIAGNOSIS — J309 Allergic rhinitis, unspecified: Secondary | ICD-10-CM | POA: Diagnosis not present

## 2022-07-22 DIAGNOSIS — I480 Paroxysmal atrial fibrillation: Secondary | ICD-10-CM | POA: Diagnosis not present

## 2022-07-22 NOTE — Progress Notes (Signed)
VIALS EXP 07-23-23

## 2022-07-22 NOTE — Telephone Encounter (Signed)
Left detailed message informing pt to follow up w/ PCP, per Dr. Curt Bears. Advised him to call me next week if he needed to discuss anything further.

## 2022-07-23 DIAGNOSIS — I480 Paroxysmal atrial fibrillation: Secondary | ICD-10-CM | POA: Diagnosis not present

## 2022-07-24 DIAGNOSIS — I480 Paroxysmal atrial fibrillation: Secondary | ICD-10-CM | POA: Diagnosis not present

## 2022-07-25 DIAGNOSIS — J3089 Other allergic rhinitis: Secondary | ICD-10-CM | POA: Diagnosis not present

## 2022-07-25 DIAGNOSIS — I480 Paroxysmal atrial fibrillation: Secondary | ICD-10-CM | POA: Diagnosis not present

## 2022-07-26 DIAGNOSIS — I480 Paroxysmal atrial fibrillation: Secondary | ICD-10-CM | POA: Diagnosis not present

## 2022-07-27 ENCOUNTER — Emergency Department (HOSPITAL_COMMUNITY): Payer: Medicaid Other

## 2022-07-27 ENCOUNTER — Emergency Department (HOSPITAL_COMMUNITY)
Admission: EM | Admit: 2022-07-27 | Discharge: 2022-07-27 | Disposition: A | Discharge status: Home / Self Care | Payer: Medicaid Other | Attending: Emergency Medicine | Admitting: Emergency Medicine

## 2022-07-27 DIAGNOSIS — M5412 Radiculopathy, cervical region: Secondary | ICD-10-CM | POA: Diagnosis not present

## 2022-07-27 DIAGNOSIS — Z7901 Long term (current) use of anticoagulants: Secondary | ICD-10-CM | POA: Diagnosis not present

## 2022-07-27 DIAGNOSIS — I480 Paroxysmal atrial fibrillation: Secondary | ICD-10-CM | POA: Diagnosis not present

## 2022-07-27 DIAGNOSIS — M5410 Radiculopathy, site unspecified: Secondary | ICD-10-CM

## 2022-07-27 DIAGNOSIS — M542 Cervicalgia: Secondary | ICD-10-CM

## 2022-07-27 MED ORDER — HYDROCODONE-ACETAMINOPHEN 5-325 MG PO TABS: 1.0000 | ORAL_TABLET | ORAL | 0 refills | Status: AC | PRN

## 2022-07-27 MED ORDER — HYDROCODONE-ACETAMINOPHEN 5-325 MG PO TABS
2.0000 | ORAL_TABLET | Freq: Once | ORAL | Status: AC
  Administered 2022-07-27: 2 via ORAL
  Filled 2022-07-27: qty 2

## 2022-07-27 MED ORDER — KETOROLAC TROMETHAMINE 15 MG/ML IJ SOLN
15.0000 mg | Freq: Once | INTRAMUSCULAR | Status: AC
  Administered 2022-07-27: 15 mg via INTRAMUSCULAR
  Filled 2022-07-27: qty 1

## 2022-07-27 NOTE — ED Triage Notes (Signed)
Patient with diagnosis of cervical radiculopathy here with complaint of neck pain. Patient states he has an appointment with spine center on 1/30 but wanted to be seen earlier to seek pain relief. Patient is alert, oriented, and in no apparent distress at this time. Patient arrives wearing c-collar disassembled and hanging loosely around his neck.

## 2022-07-27 NOTE — ED Provider Triage Note (Signed)
Emergency Medicine Provider Triage Evaluation Note  Jason Stokes , a 62 y.o. male  was evaluated in triage.  Pt complains of acute on chronic neck pain.  Wears C-collar.  Has an appointment on 1/30 for further evaluation.  History of cervical radiculopathy. No weakness. Denies numbness/tingling   CT cervical spine 12/30: IMPRESSION: 1. No evidence of acute abnormality. 2. Unchanged multilevel degenerative changes as described above.  MR 10/29 IMPRESSION: 1. 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 Moderate right foraminal narrowing at C5-6. 2. Diffusely patent spinal canal.    Review of Systems  Positive: Neck pain Negative: fever  Physical Exam  BP (!) 159/102 (BP Location: Left Arm)   Pulse 92   Temp 98.2 F (36.8 C) (Oral)   Resp 17   SpO2 98%  Gen:   Awake, no distress   Resp:  Normal effort  MSK:   Moves extremities without difficulty  Other:    Medical Decision Making  Medically screening exam initiated at 1:04 PM.  Appropriate orders placed.  Jason Stokes was informed that the remainder of the evaluation will be completed by another provider, this initial triage assessment does not replace that evaluation, and the importance of remaining in the ED until their evaluation is complete.  Repeat cervical CT   Suzy Bouchard, PA-C 07/27/22 1308

## 2022-07-27 NOTE — ED Provider Notes (Signed)
[facility] EMERGENCY DEPARTMENT Provider Note   CSN: [id number] Arrival date & time: 07/27/22  1132     History  Chief Complaint  Patient presents with   Neck Pain    Jason Stokes is a 62 y.o. male.  62 year old male with a past medical history of cervical radiculopathy presents to the ED with a chief complaint of of neck pain which has been ongoing.  Patient was placed in a [place] J after being diagnosed with a bulging disc.  He does have an appointment scheduled with Dr. Christella Noa, who be seeing him at the end of the month.  He was given pain medication last time he was in the emergency department for this pain however reports his medication has now run out.  He is continued to endorse tingling to bilateral arms however no weakness.  No fever, no vision changes.  The history is provided by the patient and medical records.  Neck Pain Pain location:  Generalized neck Quality:  Cramping Pain radiates to:  Does not radiate Pain severity:  Moderate Onset quality:  Gradual Duration:  2 weeks Timing:  Constant Progression:  Worsening Associated symptoms: no fever        Home Medications Prior to Admission medications   Medication Sig Start Date End Date Taking? Authorizing Provider  HYDROcodone-acetaminophen (NORCO/VICODIN) 5-325 MG tablet Take 1 tablet by mouth every 4 (four) hours as needed for up to 3 days. 07/27/22 07/30/22 Yes Greer Wainright, PA-C  albuterol (VENTOLIN HFA) 108 (90 Base) MCG/ACT inhaler Inhale 2 puffs into the lungs every 4 (four) hours as needed for wheezing or shortness of breath. 05/31/22   Kozlow, Donnamarie Poag, MD  amiodarone (PACERONE) 200 MG tablet Take 1 tablet (200 mg total) by mouth daily. 06/27/22   Camnitz, Ocie Doyne, MD  apixaban (ELIQUIS) 5 MG TABS tablet Take 1 tablet (5 mg total) by mouth 2 (two) times daily. 06/27/22   Camnitz, Ocie Doyne, MD  atorvastatin (LIPITOR) 40 MG tablet Take 1 tablet (40 mg total) by mouth daily. 03/16/22    Argentina Donovan, PA-C  azaTHIOprine (IMURAN) 50 MG tablet Take 100 mg by mouth daily.    [provider]  azelastine (ASTELIN) 0.1 % nasal spray 1 spray each nostril twice a day 05/31/22   Kozlow, Donnamarie Poag, MD  brimonidine (ALPHAGAN) 0.2 % ophthalmic solution Place 1 drop into both eyes every 8 (eight) hours.    [provider]  carvedilol (COREG) 25 MG tablet Take 1 tablet (25 mg total) by mouth 2 (two) times daily. 06/27/22   Camnitz, Ocie Doyne, MD  cetirizine (ZYRTEC) 10 MG tablet Take 1 tablet (10 mg total) by mouth 2 (two) times daily as needed for allergies (Can take an extra dose during flare ups.). 11/23/21   Kozlow, Donnamarie Poag, MD  diazepam (VALIUM) 5 MG tablet Take one tablet by mouth with food one hour prior to procedure. May repeat 30 minutes prior if needed. 05/30/22   Lorine Bears, NP  diclofenac Sodium (VOLTAREN) 1 % GEL Apply 1 Application topically 4 (four) times daily as needed (pain).    [provider]  Difluprednate 0.05 % EMUL Place 1 drop into both eyes 2 (two) times daily.    [provider]  diltiazem (CARDIZEM CD) 180 MG 24 hr capsule TAKE 1 CAPSULE BY MOUTH EVERY DAY 06/27/22   Camnitz, Ocie Doyne, MD  dorzolamide-timolol (COSOPT) 22.3-6.8 MG/ML ophthalmic solution Place 1 drop into both eyes 2 (two) times  daily.    [provider]  EPINEPHrine 0.3 mg/0.3 mL IJ SOAJ injection Inject 0.3 mg into the muscle as needed for anaphylaxis. 02/10/22   Kozlow, Donnamarie Poag, MD  fluticasone Asencion Islam) 50 MCG/ACT nasal spray 1 spray each nostril twice a day 05/31/22   Kozlow, Donnamarie Poag, MD  fluticasone furoate-vilanterol (BREO ELLIPTA) 200-25 MCG/ACT AEPB Inhale 1 puff into the lungs daily. 05/31/22   Kozlow, Donnamarie Poag, MD  folic acid (FOLVITE) 1 MG tablet Take 1 mg by mouth daily. 10/16/20   [provider]  GARLIC PO Take 1 tablet by mouth daily.    [provider]  hydrALAZINE (APRESOLINE) 100 MG tablet TAKE 1 TABLET (100 MG TOTAL) BY  MOUTH 3 (THREE) TIMES DAILY. 03/20/20   Camnitz, Ocie Doyne, MD  methocarbamol (ROBAXIN) 500 MG tablet Take 1 tablet (500 mg total) by mouth every 8 (eight) hours as needed for muscle spasms. 04/27/22   Meredith Pel, MD  methotrexate (RHEUMATREX) 2.5 MG tablet Take 20 mg by mouth every Monday. 11/11/20   [provider]  [place]. Devices MISC Back brace. Diagnosis chronic back pain 06/16/20   Charlott Rakes, MD  Misc. Devices MISC Left knee brace. Diagnosis L knee pain 06/16/20   Charlott Rakes, MD  montelukast (SINGULAIR) 10 MG tablet Take 1 tablet (10 mg total) by mouth at bedtime. 05/31/22   Kozlow, Donnamarie Poag, MD  Olopatadine HCl (PATADAY) 0.2 % SOLN Place 1 drop into both eyes daily as needed. 11/23/21   Kozlow, Donnamarie Poag, MD  omeprazole (PRILOSEC) 20 MG capsule TAKE 1 CAPSULE BY MOUTH EVERY DAY 06/23/22   Charlott Rakes, MD  oxyCODONE-acetaminophen (PERCOCET/ROXICET) 5-325 MG tablet Take 1 tablet by mouth every 6 (six) hours as needed for severe pain. 07/16/22   Sherrill Raring, PA-C  predniSONE (DELTASONE) 10 MG tablet Take 2 tablets (20 mg total) by mouth daily. 07/16/22   Sherrill Raring, PA-C  Vitamin D3 (VITAMIN D) 25 MCG tablet Take 1,000 Units by mouth daily.    [provider]      Allergies    Lisinopril and Tylenol [acetaminophen]    Review of Systems   Review of Systems  Constitutional:  Negative for chills and fever.  Musculoskeletal:  Positive for neck pain.    Physical Exam Updated Vital Signs BP (!) 145/109   Pulse 93   Temp 98.2 F (36.8 C) (Oral)   Resp 17   SpO2 98%  Physical Exam Vitals and nursing note reviewed.  Constitutional:      Appearance: Normal appearance.  HENT:     Head: Normocephalic and atraumatic.     Mouth/Throat:     Mouth: Mucous membranes are moist.  Eyes:     Pupils: Pupils are equal, round, and reactive to light.  Neck:     Comments: [place] J collar.  Cardiovascular:     Rate and Rhythm: Normal rate.  Pulmonary:     Effort:  Pulmonary effort is normal.     Breath sounds: No wheezing.  Abdominal:     General: Abdomen is flat.  Skin:    General: Skin is warm and dry.  Neurological:     Mental Status: He is alert and oriented to person, place, and time.     Comments: 5 out of 5 strength to bilateral upper extremities.     ED Results / Procedures / Treatments   Labs (all labs ordered are listed, but only abnormal results are displayed) Labs Reviewed - No data to  display  EKG None  Radiology CT Cervical Spine Wo Contrast  Result Date: 07/27/2022 CLINICAL DATA:  Chronic neck pain EXAM: CT CERVICAL SPINE WITHOUT CONTRAST TECHNIQUE: Multidetector CT imaging of the cervical spine was performed without intravenous contrast. Multiplanar CT image reconstructions were also generated. RADIATION DOSE REDUCTION: This exam was performed according to the departmental dose-optimization program which includes automated exposure control, adjustment of the mA and/or kV according to patient size and/or use of iterative reconstruction technique. COMPARISON:  07/16/2022, 05/15/2022 FINDINGS: Alignment: Facet joints are aligned without dislocation or traumatic listhesis. Dens and lateral masses are aligned. Skull base and vertebrae: No acute fracture. No primary bone lesion or focal pathologic process. Soft tissues and spinal canal: No prevertebral fluid or swelling. No visible canal hematoma. Disc levels: Degenerative disc disease most pronounced at the C3-4 and C5-6 levels, without interval progression from recent previous CT and MRI. Upper chest: Negative. Other: None. IMPRESSION: 1. No acute fracture or traumatic listhesis of the cervical spine. 2. 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 Electronically Signed   By: Davina Poke D.O.   On: 07/27/2022 14:03    Procedures Procedures    Medications Ordered in ED Medications  ketorolac  (TORADOL) 15 MG/ML injection 15 mg (15 mg Intramuscular Given 07/27/22 1708)  HYDROcodone-acetaminophen (NORCO/VICODIN) 5-325 MG per tablet 2 tablet (2 tablets Oral Given 07/27/22 1708)    ED Course/ Medical Decision Making/ A&P                           Medical Decision Making Risk Prescription drug management.   Patient presents to the ED with ongoing neck pain, does report being evaluated previously diagnosed with cervical radiculopathy, has established neurosurgeon Dr. Christella Noa who he would see at the end of the month.  He was prescribed narcotic pain medication which she has been taking however has not run out.  I reviewed his PDMP, and I do feel that restarting treatment is appropriate for pain control.  His CT did not show any new findings at this time.  He continues to have the tingling to bilateral upper extremities, he did see Dr. Christella Noa in the past for lumbar radiculopathy.  He is wearing a [place] J neck brace, he is requesting a soft collar at this time, this was given to patient. Discussed the CT findings on today's visit without any acute new findings, no headache, no vision changes.  I do suspect patient's ongoing radiculopathy, we discussed follow-up with Dr. Christella Noa and will return if any worsening symptoms.  He is aware that narcotic pain medication cannot be represcribed to him again in the emergency department.  Patient agreeable to plan and treatment, stable for discharge.  Portions of this note were generated with [employer]. Dictation errors may occur despite best attempts at proofreading.   Final Clinical Impression(s) / ED Diagnoses Final diagnoses:  Neck pain  Radiculopathy, unspecified spinal region    Rx / DC Orders ED Discharge Orders          Ordered    HYDROcodone-acetaminophen (NORCO/VICODIN) 5-325 MG tablet  Every 4 hours PRN        07/27/22 1714              Janeece Fitting, PA-C 07/27/22 1716    Carmin Muskrat, MD 07/27/22  1907

## 2022-07-27 NOTE — Progress Notes (Signed)
Orthopedic Tech Progress Note Patient Details:  Jason Stokes [birth year] [id number]  Ortho Devices Type of Ortho Device: Soft collar Ortho Device/Splint Location: Neck Ortho Device/Splint Interventions: Application   Post Interventions Patient Tolerated: Well  [name] [name] 07/27/2022, 5:38 PM

## 2022-07-27 NOTE — Discharge Instructions (Addendum)
We discussed the results of your CT cervical spine on today's visit.  You will need to follow-up with Dr. Christella Noa for further management of your cervical radiculopathy.

## 2022-07-28 DIAGNOSIS — I480 Paroxysmal atrial fibrillation: Secondary | ICD-10-CM | POA: Diagnosis not present

## 2022-07-29 DIAGNOSIS — I480 Paroxysmal atrial fibrillation: Secondary | ICD-10-CM | POA: Diagnosis not present

## 2022-07-30 DIAGNOSIS — I480 Paroxysmal atrial fibrillation: Secondary | ICD-10-CM | POA: Diagnosis not present

## 2022-07-31 ENCOUNTER — Other Ambulatory Visit: Payer: Self-pay | Admitting: Orthopedic Surgery

## 2022-07-31 DIAGNOSIS — I480 Paroxysmal atrial fibrillation: Secondary | ICD-10-CM | POA: Diagnosis not present

## 2022-08-01 DIAGNOSIS — I480 Paroxysmal atrial fibrillation: Secondary | ICD-10-CM | POA: Diagnosis not present

## 2022-08-02 ENCOUNTER — Encounter: Payer: Self-pay | Admitting: Cardiology

## 2022-08-02 ENCOUNTER — Other Ambulatory Visit: Payer: Self-pay | Admitting: Surgical

## 2022-08-02 ENCOUNTER — Ambulatory Visit: Payer: Medicaid Other | Attending: Cardiology | Admitting: Cardiology

## 2022-08-02 VITALS — BP 138/82 | HR 94 | Ht 73.0 in | Wt 238.0 lb

## 2022-08-02 DIAGNOSIS — Z79899 Other long term (current) drug therapy: Secondary | ICD-10-CM

## 2022-08-02 DIAGNOSIS — I480 Paroxysmal atrial fibrillation: Secondary | ICD-10-CM | POA: Diagnosis not present

## 2022-08-02 DIAGNOSIS — D6869 Other thrombophilia: Secondary | ICD-10-CM | POA: Insufficient documentation

## 2022-08-02 DIAGNOSIS — I10 Essential (primary) hypertension: Secondary | ICD-10-CM | POA: Diagnosis not present

## 2022-08-02 NOTE — Patient Instructions (Addendum)
Medication Instructions:  Your physician has recommended you make the following change in your medication:  STOP Amiodarone  *If you need a refill on your cardiac medications before your next appointment, please call your pharmacy*   Lab Work: None ordered   Testing/Procedures: None ordered   Follow-Up: At [facility], you and your health needs are our priority.  As part of our continuing mission to provide you with exceptional heart care, we have created designated Provider Care Teams.  These Care Teams include your primary Cardiologist (physician) and Advanced Practice Providers (APPs -  Physician Assistants and Nurse Practitioners) who all work together to provide you with the care you need, when you need it.  Your next appointment:   6 month(s)  The format for your next appointment:   In Person  Provider:   Will Camnitz, MD    Thank you for choosing [facility]!!   Marayah Higdon, RN [phone]      

## 2022-08-02 NOTE — Progress Notes (Addendum)
Electrophysiology Office Note   Date:  08/02/2022   ID:  Jason Stokes, DOB 1961/01/13, MRN [id number]  PCP:  Charlott Rakes, MD  Cardiologist:  [name] Primary Electrophysiologist: Kelen Laura [name], MD    No chief complaint on file.     History of Present Illness: Jason Stokes is a 62 y.o. male who presents today for electrophysiology evaluation.     He has a history significant for atrial fibrillation, hypertension, CVA.  He is post A-fib ablation 12/10/2015.  He is currently on amiodarone.  He was having more frequent episodes of atrial fibrillation and is post second ablation 04/28/2022.  Today, denies symptoms of palpitations, chest pain, shortness of breath, orthopnea, PND, lower extremity edema, claudication, dizziness, presyncope, syncope, bleeding, or neurologic sequela. The patient is tolerating medications without difficulties.  His ablation he has done well.  He is noted no further episodes of atrial fibrillation.  Unfortunately he has been having neck pain.  He is potentially planned for an injection.    Past Medical History:  Diagnosis Date   Anxiety    Bilateral carpal tunnel syndrome 01/10/2018   Bipolar disorder (HCC)    Chronic lower back pain    Depression    Dysrhythmia    a-fib   GERD (gastroesophageal reflux disease)    History of alcohol abuse    History of nuclear stress test    Myoview 10/16: EF 50%, diaphragmatic attenuation, no ischemia, low risk   Hypertension    Migraine 2012-2014   Moderate persistent asthma with acute exacerbation 05/02/2018   PAF (paroxysmal atrial fibrillation) ([place]) 03/27/2015   a. Myoview neg for ischemia >> Flecainide started 10/16 >> FU ETT    Schizophrenia ([place])    Sciatica neuralgia    Small vessel disease ([place])    Right basal ganglia stroke   Stroke [facility]) "between 2012-2014"   residual "AF" (09/22/2015)   Past Surgical History:  Procedure Laterality Date   ATRIAL FIBRILLATION ABLATION  09/22/2015    ATRIAL FIBRILLATION ABLATION N/A 04/28/2022   Procedure: ATRIAL FIBRILLATION ABLATION;  Surgeon: Constance Haw, MD;  Location: [place] CV LAB;  Service: Cardiovascular;  Laterality: N/A;   CYST EXCISION  1996-97   surgery back of head    ELECTROPHYSIOLOGIC STUDY N/A 09/22/2015   Procedure: Atrial Fibrillation Ablation;  Surgeon: Jazari Ober [name], MD;  Location: [place] CV LAB;  Service: Cardiovascular;  Laterality: N/A;   ELECTROPHYSIOLOGIC STUDY N/A 12/10/2015   Procedure: Atrial Fibrillation Ablation;  Surgeon: Lile Mccurley [name], MD;  Location: [place] CV LAB;  Service: Cardiovascular;  Laterality: N/A;   ELECTROPHYSIOLOGIC STUDY N/A 12/11/2015   Procedure: Cardioversion;  Surgeon: Ines Warf [name], MD;  Location: [place] [facility];  Service: Cardiovascular;  Laterality: N/A;   EMBOLIZATION Right 08/19/2020   Procedure: EMBOLIZATION;  Surgeon: Cherre Robins, MD;  Location: [place] CV LAB;  Service: Cardiovascular;  Laterality: Right;  hypogastric   EXCISION MASS HEAD N/A 01/06/2017   Procedure: EXCISION MASS FOREHEAD;  Surgeon: Irene Limbo, MD;  Location: [place];  Service: Plastics;  Laterality: N/A;   GANGLION CYST EXCISION Left    INTERCOSTAL NERVE BLOCK  2005   KNEE ARTHROSCOPY Right 2016   MASS EXCISION N/A 01/25/2021   Procedure: EXCISION SUBCUTANEOUS VS SUBFASCIAL MASS TORSO 3CM;  Surgeon: Irene Limbo, MD;  Location: [place];  Service: Plastics;  Laterality: N/A;     Current Outpatient Medications  Medication Sig Dispense Refill  albuterol (VENTOLIN HFA) 108 (90 Base) MCG/ACT inhaler Inhale 2 puffs into the lungs every 4 (four) hours as needed for wheezing or shortness of breath. 54 g 1   apixaban (ELIQUIS) 5 MG TABS tablet Take 1 tablet (5 mg total) by mouth 2 (two) times daily. 60 tablet 5   atorvastatin (LIPITOR) 40 MG tablet Take 1 tablet (40 mg total) by mouth daily. 90 tablet 1   azaTHIOprine  (IMURAN) 50 MG tablet Take 100 mg by mouth daily.     azelastine (ASTELIN) 0.1 % nasal spray 1 spray each nostril twice a day 30 mL 5   brimonidine (ALPHAGAN) 0.2 % ophthalmic solution Place 1 drop into both eyes every 8 (eight) hours.     carvedilol (COREG) 25 MG tablet Take 1 tablet (25 mg total) by mouth 2 (two) times daily. 180 tablet 2   cetirizine (ZYRTEC) 10 MG tablet Take 1 tablet (10 mg total) by mouth 2 (two) times daily as needed for allergies (Can take an extra dose during flare ups.). 60 tablet 5   diazepam (VALIUM) 5 MG tablet Take one tablet by mouth with food one hour prior to procedure. May repeat 30 minutes prior if needed. 2 tablet 0   diclofenac Sodium (VOLTAREN) 1 % GEL Apply 1 Application topically 4 (four) times daily as needed (pain).     Difluprednate 0.05 % EMUL Place 1 drop into both eyes 2 (two) times daily.     diltiazem (CARDIZEM CD) 180 MG 24 hr capsule TAKE 1 CAPSULE BY MOUTH EVERY DAY 90 capsule 2   dorzolamide-timolol (COSOPT) 22.3-6.8 MG/ML ophthalmic solution Place 1 drop into both eyes 2 (two) times daily.     EPINEPHrine 0.3 mg/0.3 mL IJ SOAJ injection Inject 0.3 mg into the muscle as needed for anaphylaxis. 0.3 mL 1   fluticasone (FLONASE) 50 MCG/ACT nasal spray 1 spray each nostril twice a day 16 g 5   fluticasone furoate-vilanterol (BREO ELLIPTA) 200-25 MCG/ACT AEPB Inhale 1 puff into the lungs daily. 28 each 5   folic acid (FOLVITE) 1 MG tablet Take 1 mg by mouth daily.     GARLIC PO Take 1 tablet by mouth daily.     hydrALAZINE (APRESOLINE) 100 MG tablet TAKE 1 TABLET (100 MG TOTAL) BY MOUTH 3 (THREE) TIMES DAILY. 270 tablet 2   latanoprost (XALATAN) 0.005 % ophthalmic solution 1 drop daily.     methocarbamol (ROBAXIN) 500 MG tablet TAKE 1 TABLET BY MOUTH EVERY 8 HOURS AS NEEDED FOR MUSCLE SPASMS. 30 tablet 0   methotrexate (RHEUMATREX) 2.5 MG tablet Take 20 mg by mouth every Monday.     Misc. Devices MISC Back brace. Diagnosis chronic back pain 1 each 0    Misc. Devices MISC Left knee brace. Diagnosis L knee pain 1 each 0   montelukast (SINGULAIR) 10 MG tablet Take 1 tablet (10 mg total) by mouth at bedtime. 30 tablet 5   Olopatadine HCl (PATADAY) 0.2 % SOLN Place 1 drop into both eyes daily as needed. 2.5 mL 5   omeprazole (PRILOSEC) 20 MG capsule TAKE 1 CAPSULE BY MOUTH EVERY DAY 90 capsule 0   oxyCODONE-acetaminophen (PERCOCET/ROXICET) 5-325 MG tablet Take 1 tablet by mouth every 6 (six) hours as needed for severe pain. 6 tablet 0   Vitamin D3 (VITAMIN D) 25 MCG tablet Take 1,000 Units by mouth daily.     No current facility-administered medications for this visit.    Allergies:   Lisinopril and Tylenol [acetaminophen]   Social  History:  The patient  reports that he has been smoking cigarettes. He has never used smokeless tobacco. He reports that he does not currently use alcohol after a past usage of about 6.0 standard drinks of alcohol per week. He reports current drug use. Drug: Marijuana.   Family History:  The patient's family history includes Heart disease in his father; Schizophrenia in his sister.   ROS:  Please see the history of present illness.   Otherwise, review of systems is positive for none.   All other systems are reviewed and negative.   PHYSICAL EXAM: VS:  BP 138/82   Pulse 94   Ht '6\' 1"'$  (1.854 m)   Wt 238 lb (108 kg)   SpO2 95%   BMI 31.40 kg/m  , BMI Body mass index is 31.4 kg/m. GEN: Well nourished, well developed, in no acute distress  HEENT: normal  Neck: no JVD, carotid bruits, or masses Cardiac: RRR; no murmurs, rubs, or gallops,no edema  Respiratory:  clear to auscultation bilaterally, normal work of breathing GI: soft, nontender, nondistended, + BS MS: no deformity or atrophy  Skin: warm and dry Neuro:  Strength and sensation are intact Psych: euthymic mood, full affect  EKG:  EKG is ordered today. Personal review of the ekg ordered shows sinus rhythm, rate 94   Recent Labs: 04/15/2022:  Hemoglobin 12.8; Platelets 168 05/26/2022: TSH 0.405 06/23/2022: ALT 13; BUN <5; Creatinine, Ser 0.97; Potassium 3.5; Sodium 140    Lipid Panel     Component Value Date/Time   CHOL 171 05/07/2018 1004   TRIG 204 (H) 05/07/2018 1004   HDL 33 (L) 05/07/2018 1004   CHOLHDL 5.2 (H) 05/07/2018 1004   CHOLHDL 5.4 10/17/2013 1042   VLDL 36 10/17/2013 1042   LDLCALC 97 05/07/2018 1004     Wt Readings from Last 3 Encounters:  08/02/22 238 lb (108 kg)  07/16/22 235 lb (106.6 kg)  05/31/22 233 lb 9.6 oz (106 kg)     Other studies Reviewed: Additional studies/ records that were reviewed today include:  SPECT 05/15/15, TTE 03/31/15 Review of the above records today demonstrates:  SPECT The left ventricular ejection fraction is mildly decreased (45-54%). Nuclear stress EF: 50%. There was no ST segment deviation noted during stress. Defect 1: Moderate sized, mild in intensity, fixed defect in the mid and basal inferior and basal inferolateral wall consistent with diaphragmatic attenuation. No ischemia noted. This is a low risk study.  TTE - Normal LV systolic function; grade 1 diastolic dysfunction; mild LAE.  ETT 01/26/17 - personally reviewed Blood pressure demonstrated a normal response to exercise. There was no ST segment deviation noted during stress. Negative study for exercise induced ischemia. There were frequent PVCs, ventricular couplets and ventricular salvos.   ASSESSMENT AND PLAN:  1.  Paroxysmal atrial fibrillation: Currently on Eliquis 5 mg twice daily, amiodarone 200 mg daily.  CHA2DS2-VASc of 3.  Status post ablation 12/10/2015 with repeat ablation 04/28/2022.  He remains in sinus rhythm.  Hildagard Sobecki stop amiodarone today.  2.  Hypertension: well controlled   3.  Secondary to coagula state: Currently on Eliquis for atrial fibrillation as above  4.  High risk medication monitoring: Currently on amiodarone for atrial fibrillation.  Labs without abnormality.  Plan to stop  amiodarone today.  5.  Preoperative evaluation: Patient has plans for cyst excision from the right forearm.  His activity is limited from that neck pain, though prior to that, was able to do all of his daily activities  without restriction.  Would be at intermediate risk for this low to intermediate risk procedure.  Would be able to hold anticoagulation per pharmacy protocol.  Current medicines are reviewed at length with the patient today.   The patient does not have concerns regarding his medicines.  The following changes were made today: stop amiodarone  Labs/ tests ordered today include:  Orders Placed This Encounter  Procedures   EKG 12-Lead      Disposition:   FU 6 months.  Signed, Maile Linford [name], MD  08/02/2022 3:01 PM     [address] [place] [address] [phone] (office) [phone] (fax)

## 2022-08-02 NOTE — Telephone Encounter (Signed)
   [name] at the hand center calling to follow up clearance. They wanted to know how many days can the pt held blood thinner. The pt was seen today by Dr. Curt Bears

## 2022-08-03 ENCOUNTER — Encounter: Payer: Self-pay | Admitting: Cardiology

## 2022-08-03 ENCOUNTER — Telehealth: Payer: Self-pay | Admitting: Cardiology

## 2022-08-03 ENCOUNTER — Encounter (HOSPITAL_BASED_OUTPATIENT_CLINIC_OR_DEPARTMENT_OTHER): Payer: Self-pay | Admitting: Orthopedic Surgery

## 2022-08-03 DIAGNOSIS — I480 Paroxysmal atrial fibrillation: Secondary | ICD-10-CM | POA: Diagnosis not present

## 2022-08-03 NOTE — Telephone Encounter (Signed)
I have faxed over cardiac clearance notes to requested surgeon's office and tried to reach the [facility] to make them aware of the fax sent.

## 2022-08-03 NOTE — Progress Notes (Signed)
Reviewed chart with Dr. Lissa Hoard at [facility] okay to proceed with surgery as scheduled.

## 2022-08-03 NOTE — Telephone Encounter (Signed)
Jason Stokes with the [facility] c/b stating the clearance note does not address if pt can hold his eliquis or not. Please advise.   C/b number [phone]

## 2022-08-03 NOTE — Telephone Encounter (Signed)
Jason Stokes with the [facility] called back stating they found the information needed and do not need to be contacted back.

## 2022-08-03 NOTE — Telephone Encounter (Signed)
Office calling to f/u on Clearance due to pt having scheduled procedure on 08/08/22. Pt did not take morning dose of Eliquis this morning. Please advise

## 2022-08-03 NOTE — Telephone Encounter (Signed)
Error

## 2022-08-04 DIAGNOSIS — I480 Paroxysmal atrial fibrillation: Secondary | ICD-10-CM | POA: Diagnosis not present

## 2022-08-05 DIAGNOSIS — I480 Paroxysmal atrial fibrillation: Secondary | ICD-10-CM | POA: Diagnosis not present

## 2022-08-05 NOTE — Progress Notes (Signed)

## 2022-08-06 DIAGNOSIS — I480 Paroxysmal atrial fibrillation: Secondary | ICD-10-CM | POA: Diagnosis not present

## 2022-08-07 DIAGNOSIS — I480 Paroxysmal atrial fibrillation: Secondary | ICD-10-CM | POA: Diagnosis not present

## 2022-08-08 ENCOUNTER — Encounter (HOSPITAL_BASED_OUTPATIENT_CLINIC_OR_DEPARTMENT_OTHER): Payer: Self-pay | Admitting: Orthopedic Surgery

## 2022-08-08 ENCOUNTER — Encounter (HOSPITAL_BASED_OUTPATIENT_CLINIC_OR_DEPARTMENT_OTHER)
Admission: RE | Disposition: A | Discharge status: Home / Self Care | Payer: Self-pay | Source: Home / Self Care | Attending: Orthopedic Surgery

## 2022-08-08 ENCOUNTER — Ambulatory Visit (HOSPITAL_BASED_OUTPATIENT_CLINIC_OR_DEPARTMENT_OTHER): Payer: Medicaid Other | Admitting: Anesthesiology

## 2022-08-08 ENCOUNTER — Other Ambulatory Visit: Payer: Self-pay

## 2022-08-08 ENCOUNTER — Ambulatory Visit (HOSPITAL_BASED_OUTPATIENT_CLINIC_OR_DEPARTMENT_OTHER)
Admission: RE | Admit: 2022-08-08 | Discharge: 2022-08-08 | Disposition: A | Discharge status: Home / Self Care | Payer: Medicaid Other | Attending: Orthopedic Surgery | Admitting: Orthopedic Surgery

## 2022-08-08 DIAGNOSIS — Z01818 Encounter for other preprocedural examination: Secondary | ICD-10-CM

## 2022-08-08 DIAGNOSIS — I10 Essential (primary) hypertension: Secondary | ICD-10-CM | POA: Insufficient documentation

## 2022-08-08 DIAGNOSIS — E119 Type 2 diabetes mellitus without complications: Secondary | ICD-10-CM | POA: Diagnosis not present

## 2022-08-08 DIAGNOSIS — F17210 Nicotine dependence, cigarettes, uncomplicated: Secondary | ICD-10-CM | POA: Insufficient documentation

## 2022-08-08 DIAGNOSIS — Z8673 Personal history of transient ischemic attack (TIA), and cerebral infarction without residual deficits: Secondary | ICD-10-CM | POA: Insufficient documentation

## 2022-08-08 DIAGNOSIS — F418 Other specified anxiety disorders: Secondary | ICD-10-CM | POA: Diagnosis not present

## 2022-08-08 DIAGNOSIS — D1721 Benign lipomatous neoplasm of skin and subcutaneous tissue of right arm: Secondary | ICD-10-CM | POA: Diagnosis not present

## 2022-08-08 DIAGNOSIS — K219 Gastro-esophageal reflux disease without esophagitis: Secondary | ICD-10-CM | POA: Diagnosis not present

## 2022-08-08 DIAGNOSIS — R2231 Localized swelling, mass and lump, right upper limb: Secondary | ICD-10-CM

## 2022-08-08 DIAGNOSIS — I480 Paroxysmal atrial fibrillation: Secondary | ICD-10-CM | POA: Diagnosis not present

## 2022-08-08 HISTORY — PX: EXCISION MASS UPPER EXTREMETIES: SHX6704

## 2022-08-08 SURGERY — EXCISION MASS UPPER EXTREMITIES
Anesthesia: Monitor Anesthesia Care | Site: Arm Lower | Laterality: Right

## 2022-08-08 MED ORDER — CEFAZOLIN SODIUM-DEXTROSE 2-4 GM/100ML-% IV SOLN
2.0000 g | INTRAVENOUS | Status: AC
  Administered 2022-08-08: 2 g via INTRAVENOUS

## 2022-08-08 MED ORDER — CEFAZOLIN SODIUM-DEXTROSE 2-4 GM/100ML-% IV SOLN
INTRAVENOUS | Status: AC
  Filled 2022-08-08: qty 100

## 2022-08-08 MED ORDER — BUPIVACAINE HCL (PF) 0.25 % IJ SOLN
INTRAMUSCULAR | Status: DC | PRN
  Administered 2022-08-08: 9 mL

## 2022-08-08 MED ORDER — OXYCODONE HCL 5 MG/5ML PO SOLN: 5.0000 mg | Freq: Once | ORAL | Status: DC | PRN

## 2022-08-08 MED ORDER — BUPIVACAINE HCL (PF) 0.25 % IJ SOLN
INTRAMUSCULAR | Status: AC
  Filled 2022-08-08: qty 30

## 2022-08-08 MED ORDER — HYDROCODONE-ACETAMINOPHEN 5-325 MG PO TABS: ORAL_TABLET | ORAL | 0 refills | Status: DC

## 2022-08-08 MED ORDER — LIDOCAINE HCL (PF) 0.5 % IJ SOLN
INTRAMUSCULAR | Status: DC | PRN
  Administered 2022-08-08: 35 mL via INTRAVENOUS

## 2022-08-08 MED ORDER — ONDANSETRON HCL 4 MG/2ML IJ SOLN
INTRAMUSCULAR | Status: DC | PRN
  Administered 2022-08-08: 4 mg via INTRAVENOUS

## 2022-08-08 MED ORDER — FENTANYL CITRATE (PF) 100 MCG/2ML IJ SOLN: 25.0000 ug | INTRAMUSCULAR | Status: DC | PRN

## 2022-08-08 MED ORDER — FENTANYL CITRATE (PF) 100 MCG/2ML IJ SOLN
INTRAMUSCULAR | Status: AC
  Filled 2022-08-08: qty 2

## 2022-08-08 MED ORDER — FENTANYL CITRATE (PF) 100 MCG/2ML IJ SOLN
INTRAMUSCULAR | Status: DC | PRN
  Administered 2022-08-08: 50 ug via INTRAVENOUS

## 2022-08-08 MED ORDER — OXYCODONE HCL 5 MG PO TABS: 5.0000 mg | ORAL_TABLET | Freq: Once | ORAL | Status: DC | PRN

## 2022-08-08 MED ORDER — 0.9 % SODIUM CHLORIDE (POUR BTL) OPTIME
TOPICAL | Status: DC | PRN
  Administered 2022-08-08: 100 mL

## 2022-08-08 MED ORDER — PROPOFOL 500 MG/50ML IV EMUL
INTRAVENOUS | Status: DC | PRN
  Administered 2022-08-08: 100 ug/kg/min via INTRAVENOUS

## 2022-08-08 MED ORDER — LACTATED RINGERS IV SOLN: INTRAVENOUS | Status: DC

## 2022-08-08 MED ORDER — MIDAZOLAM HCL 2 MG/2ML IJ SOLN
INTRAMUSCULAR | Status: AC
  Filled 2022-08-08: qty 2

## 2022-08-08 MED ORDER — MIDAZOLAM HCL 5 MG/5ML IJ SOLN
INTRAMUSCULAR | Status: DC | PRN
  Administered 2022-08-08: 2 mg via INTRAVENOUS

## 2022-08-08 MED ORDER — AMISULPRIDE (ANTIEMETIC) 5 MG/2ML IV SOLN: 10.0000 mg | Freq: Once | INTRAVENOUS | Status: DC | PRN

## 2022-08-08 SURGICAL SUPPLY — 56 items
APL PRP STRL LF DISP 70% ISPRP (MISCELLANEOUS) ×1
APL SKNCLS STERI-STRIP NONHPOA (GAUZE/BANDAGES/DRESSINGS)
BANDAGE GAUZE 1X75IN STRL (MISCELLANEOUS) IMPLANT
BENZOIN TINCTURE PRP APPL 2/3 (GAUZE/BANDAGES/DRESSINGS) IMPLANT
BLADE MINI RND TIP GREEN BEAV (BLADE) IMPLANT
BLADE SURG 15 STRL LF DISP TIS (BLADE) ×2 IMPLANT
BLADE SURG 15 STRL SS (BLADE) ×2
BNDG CMPR 5X2 CHSV 1 LYR STRL (GAUZE/BANDAGES/DRESSINGS)
BNDG CMPR 75X11 PLY HI ABS (MISCELLANEOUS)
BNDG CMPR 75X21 PLY HI ABS (MISCELLANEOUS)
BNDG CMPR 9X4 STRL LF SNTH (GAUZE/BANDAGES/DRESSINGS)
BNDG COHESIVE 1X5 TAN STRL LF (GAUZE/BANDAGES/DRESSINGS) IMPLANT
BNDG COHESIVE 2X5 TAN ST LF (GAUZE/BANDAGES/DRESSINGS) IMPLANT
BNDG ELASTIC 2X5.8 VLCR STR LF (GAUZE/BANDAGES/DRESSINGS) IMPLANT
BNDG ELASTIC 3X5.8 VLCR STR LF (GAUZE/BANDAGES/DRESSINGS) IMPLANT
BNDG ESMARK 4X9 LF (GAUZE/BANDAGES/DRESSINGS) IMPLANT
BNDG GAUZE 1X75IN STRL (MISCELLANEOUS)
BNDG GAUZE DERMACEA FLUFF 4 (GAUZE/BANDAGES/DRESSINGS) IMPLANT
BNDG GZE DERMACEA 4 6PLY (GAUZE/BANDAGES/DRESSINGS)
BNDG PLASTER X FAST 3X3 WHT LF (CAST SUPPLIES) IMPLANT
BNDG PLSTR 9X3 FST ST WHT (CAST SUPPLIES)
CHLORAPREP W/TINT 26 (MISCELLANEOUS) ×1 IMPLANT
CORD BIPOLAR FORCEPS 12FT (ELECTRODE) ×1 IMPLANT
COVER BACK TABLE 60X90IN (DRAPES) ×1 IMPLANT
COVER MAYO STAND STRL (DRAPES) ×1 IMPLANT
CUFF TOURN SGL QUICK 18X4 (TOURNIQUET CUFF) ×1 IMPLANT
DRAPE EXTREMITY T 121X128X90 (DISPOSABLE) ×1 IMPLANT
DRAPE SURG 17X23 STRL (DRAPES) ×1 IMPLANT
GAUZE SPONGE 4X4 12PLY STRL (GAUZE/BANDAGES/DRESSINGS) ×1 IMPLANT
GAUZE STRETCH 2X75IN STRL (MISCELLANEOUS) IMPLANT
GAUZE XEROFORM 1X8 LF (GAUZE/BANDAGES/DRESSINGS) ×1 IMPLANT
GLOVE BIO SURGEON STRL SZ7.5 (GLOVE) ×1 IMPLANT
GLOVE BIOGEL PI IND STRL 8 (GLOVE) ×1 IMPLANT
GOWN STRL REUS W/ TWL LRG LVL3 (GOWN DISPOSABLE) ×1 IMPLANT
GOWN STRL REUS W/TWL LRG LVL3 (GOWN DISPOSABLE) ×1
GOWN STRL REUS W/TWL XL LVL3 (GOWN DISPOSABLE) ×1 IMPLANT
NDL HYPO 25X1 1.5 SAFETY (NEEDLE) ×1 IMPLANT
NEEDLE HYPO 25X1 1.5 SAFETY (NEEDLE) ×1 IMPLANT
NS IRRIG 1000ML POUR BTL (IV SOLUTION) ×1 IMPLANT
PACK BASIN DAY SURGERY FS (CUSTOM PROCEDURE TRAY) ×1 IMPLANT
PAD CAST 3X4 CTTN HI CHSV (CAST SUPPLIES) IMPLANT
PAD CAST 4YDX4 CTTN HI CHSV (CAST SUPPLIES) IMPLANT
PADDING CAST ABS COTTON 4X4 ST (CAST SUPPLIES) ×1 IMPLANT
PADDING CAST COTTON 3X4 STRL (CAST SUPPLIES)
PADDING CAST COTTON 4X4 STRL (CAST SUPPLIES)
STOCKINETTE 4X48 STRL (DRAPES) ×1 IMPLANT
STRIP CLOSURE SKIN 1/2X4 (GAUZE/BANDAGES/DRESSINGS) IMPLANT
SUT ETHILON 3 0 PS 1 (SUTURE) IMPLANT
SUT ETHILON 4 0 PS 2 18 (SUTURE) ×1 IMPLANT
SUT ETHILON 5 0 P 3 18 (SUTURE)
SUT NYLON ETHILON 5-0 P-3 1X18 (SUTURE) IMPLANT
SUT VIC AB 4-0 P2 18 (SUTURE) IMPLANT
SYR BULB EAR ULCER 3OZ GRN STR (SYRINGE) ×1 IMPLANT
SYR CONTROL 10ML LL (SYRINGE) ×1 IMPLANT
TOWEL GREEN STERILE FF (TOWEL DISPOSABLE) ×2 IMPLANT
UNDERPAD 30X36 HEAVY ABSORB (UNDERPADS AND DIAPERS) ×1 IMPLANT

## 2022-08-08 NOTE — H&P (Signed)
Jason Stokes is an 62 y.o. male.   Chief Complaint: forearm mass HPI: 62 yo male with right forearm mass.  It is bothersome to him.  He wishes to have it removed.  Allergies:  Allergies  Allergen Reactions   Lisinopril Anaphylaxis    Swelling of lips and tongue.   Tylenol [Acetaminophen] Other (See Comments)    Acid reflux     Past Medical History:  Diagnosis Date   Anxiety    Bilateral carpal tunnel syndrome 01/10/2018   Bipolar disorder (HCC)    Chronic lower back pain    Depression    Dysrhythmia    a-fib   GERD (gastroesophageal reflux disease)    History of alcohol abuse    History of nuclear stress test    Myoview 10/16: EF 50%, diaphragmatic attenuation, no ischemia, low risk   Hypertension    Migraine 2012-2014   Moderate persistent asthma with acute exacerbation 05/02/2018   PAF (paroxysmal atrial fibrillation) ([place]) 03/27/2015   a. Myoview neg for ischemia >> Flecainide started 10/16 >> FU ETT    Schizophrenia ([place])    Sciatica neuralgia    Small vessel disease ([place])    Right basal ganglia stroke   Stroke [facility]) "between 2012-2014"   residual "AF" (09/22/2015)    Past Surgical History:  Procedure Laterality Date   ATRIAL FIBRILLATION ABLATION  09/22/2015   ATRIAL FIBRILLATION ABLATION N/A 04/28/2022   Procedure: ATRIAL FIBRILLATION ABLATION;  Surgeon: Constance Haw, MD;  Location: [place] [facility];  Service: Cardiovascular;  Laterality: N/A;   CYST EXCISION  1996-97   surgery back of head    ELECTROPHYSIOLOGIC STUDY N/A 09/22/2015   Procedure: Atrial Fibrillation Ablation;  Surgeon: Will [name], MD;  Location: [place] [facility];  Service: Cardiovascular;  Laterality: N/A;   ELECTROPHYSIOLOGIC STUDY N/A 12/10/2015   Procedure: Atrial Fibrillation Ablation;  Surgeon: Will [name], MD;  Location: [place] [facility];  Service: Cardiovascular;  Laterality: N/A;   ELECTROPHYSIOLOGIC STUDY N/A 12/11/2015   Procedure: Cardioversion;  Surgeon:  Will [name], MD;  Location: [place] [facility];  Service: Cardiovascular;  Laterality: N/A;   EMBOLIZATION Right 08/19/2020   Procedure: EMBOLIZATION;  Surgeon: Cherre Robins, MD;  Location: [place] [facility];  Service: Cardiovascular;  Laterality: Right;  hypogastric   EXCISION MASS HEAD N/A 01/06/2017   Procedure: EXCISION MASS FOREHEAD;  Surgeon: Irene Limbo, MD;  Location: [place];  Service: Plastics;  Laterality: N/A;   GANGLION CYST EXCISION Left    INTERCOSTAL NERVE BLOCK  2005   KNEE ARTHROSCOPY Right 2016   MASS EXCISION N/A 01/25/2021   Procedure: EXCISION SUBCUTANEOUS VS SUBFASCIAL MASS TORSO 3CM;  Surgeon: Irene Limbo, MD;  Location: [place];  Service: Plastics;  Laterality: N/A;    Family History: Family History  Problem Relation Age of Onset   Heart disease Father    Schizophrenia Sister     Social History:   reports that he has been smoking cigarettes. He has never used smokeless tobacco. He reports that he does not currently use alcohol after a past usage of about 6.0 standard drinks of alcohol per week. He reports current drug use. Drug: Marijuana.  Medications: Medications Prior to Admission  Medication Sig Dispense Refill   albuterol (VENTOLIN HFA) 108 (90 Base) MCG/ACT inhaler Inhale 2 puffs into the lungs every 4 (four) hours as needed for wheezing or shortness of breath. 54 g 1   apixaban (ELIQUIS)  5 MG TABS tablet Take 1 tablet (5 mg total) by mouth 2 (two) times daily. 60 tablet 5   atorvastatin (LIPITOR) 40 MG tablet Take 1 tablet (40 mg total) by mouth daily. 90 tablet 1   azaTHIOprine (IMURAN) 50 MG tablet Take 100 mg by mouth daily.     azelastine (ASTELIN) 0.1 % nasal spray 1 spray each nostril twice a day 30 mL 5   brimonidine (ALPHAGAN) 0.2 % ophthalmic solution Place 1 drop into both eyes every 8 (eight) hours.     carvedilol (COREG) 25 MG tablet Take 1 tablet (25 mg total) by mouth 2 (two) times  daily. 180 tablet 2   cetirizine (ZYRTEC) 10 MG tablet Take 1 tablet (10 mg total) by mouth 2 (two) times daily as needed for allergies (Can take an extra dose during flare ups.). 60 tablet 5   diclofenac Sodium (VOLTAREN) 1 % GEL Apply 1 Application topically 4 (four) times daily as needed (pain).     Difluprednate 0.05 % EMUL Place 1 drop into both eyes 2 (two) times daily.     diltiazem (CARDIZEM CD) 180 MG 24 hr capsule TAKE 1 CAPSULE BY MOUTH EVERY DAY 90 capsule 2   dorzolamide-timolol (COSOPT) 22.3-6.8 MG/ML ophthalmic solution Place 1 drop into both eyes 2 (two) times daily.     EPINEPHrine 0.3 mg/0.3 mL IJ SOAJ injection Inject 0.3 mg into the muscle as needed for anaphylaxis. 0.3 mL 1   fluticasone (FLONASE) 50 MCG/ACT nasal spray 1 spray each nostril twice a day 16 g 5   fluticasone furoate-vilanterol (BREO ELLIPTA) 200-25 MCG/ACT AEPB Inhale 1 puff into the lungs daily. 28 each 5   folic acid (FOLVITE) 1 MG tablet Take 1 mg by mouth daily.     GARLIC PO Take 1 tablet by mouth daily.     hydrALAZINE (APRESOLINE) 100 MG tablet TAKE 1 TABLET (100 MG TOTAL) BY MOUTH 3 (THREE) TIMES DAILY. 270 tablet 2   latanoprost (XALATAN) 0.005 % ophthalmic solution 1 drop daily.     Misc. Devices MISC Back brace. Diagnosis chronic back pain 1 each 0   Misc. Devices MISC Left knee brace. Diagnosis L knee pain 1 each 0   montelukast (SINGULAIR) 10 MG tablet Take 1 tablet (10 mg total) by mouth at bedtime. 30 tablet 5   Olopatadine HCl (PATADAY) 0.2 % SOLN Place 1 drop into both eyes daily as needed. 2.5 mL 5   omeprazole (PRILOSEC) 20 MG capsule TAKE 1 CAPSULE BY MOUTH EVERY DAY 90 capsule 0   Vitamin D3 (VITAMIN D) 25 MCG tablet Take 1,000 Units by mouth daily.     methocarbamol (ROBAXIN) 500 MG tablet TAKE 1 TABLET BY MOUTH EVERY 8 HOURS AS NEEDED FOR MUSCLE SPASM 30 tablet 0    No results found for this or any previous visit (from the past 48 hour(s)).  No results found.    Blood pressure  127/81, pulse 96, temperature 97.7 F (36.5 C), temperature source Oral, resp. rate 16, height '6\' 1"'$  (1.854 m), weight 107.1 kg, SpO2 100 %.  General appearance: alert, cooperative, and appears stated age Head: Normocephalic, without obvious abnormality, atraumatic Neck: supple, symmetrical, trachea midline Extremities: Intact sensation and capillary refill all digits.  +epl/fpl/io.  No wounds.  Pulses: 2+ and symmetric Skin: Skin color, texture, turgor normal. No rashes or lesions Neurologic: Grossly normal Incision/Wound: none  Assessment/Plan Right forearm mass.  Non operative and operative treatment options have been discussed with the patient and patient  wishes to proceed with operative treatment. Risks, benefits, and alternatives of surgery have been discussed and the patient agrees with the plan of care.   Leanora Cover 08/08/2022, 1:01 PM

## 2022-08-08 NOTE — Transfer of Care (Signed)
Immediate Anesthesia Transfer of Care Note  [name]  Procedure(s) Performed: EXCISION MASS RIGHT FOREARM (Right: Arm Lower)  Patient Location: PACU  Anesthesia Type:General  Level of Consciousness: awake, alert , and oriented  Airway & Oxygen Therapy: Patient Spontanous Breathing and Patient connected to face mask oxygen  Post-op Assessment: Report given to RN and Post -op Vital signs reviewed and stable  Post vital signs: Reviewed and stable  Last Vitals:  Vitals Value Taken Time  BP 145/81 08/08/22 1418  Temp    Pulse 80 08/08/22 1419  Resp 28 08/08/22 1419  SpO2 96 % 08/08/22 1419  Vitals shown include unvalidated device data.  Last Pain:  Vitals:   08/08/22 1238  TempSrc: Oral  PainSc: 10-Worst pain ever      Patients Stated Pain Goal: 3 (18/98/42 1031)  Complications: No notable events documented.

## 2022-08-08 NOTE — Anesthesia Procedure Notes (Signed)
Anesthesia Regional Block: Bier block (IV Regional)   Pre-Anesthetic Checklist: , timeout performed,  Correct Patient, Correct Site, Correct Laterality,  Correct Procedure, Correct Position, site marked,  Risks and benefits discussed,  Surgical consent,  Pre-op evaluation,  At surgeon's request and post-op pain management  Laterality: Right  Prep: alcohol swabs        Procedures:,,,,, intact distal pulses, Esmarch exsanguination,  Single tourniquet utilized,  #20gu IV placed    Narrative:  CRNA: Verita Lamb, CRNA

## 2022-08-08 NOTE — Discharge Instructions (Addendum)

## 2022-08-08 NOTE — Op Note (Signed)
NAME: [place] RECORD NO: [id number] DATE OF BIRTH: 06/13/1961 FACILITY: [facility] LOCATION: [facility] PHYSICIAN: Tennis Must, MD   OPERATIVE REPORT   DATE OF PROCEDURE: 08/08/22    PREOPERATIVE DIAGNOSIS: Right forearm mass   POSTOPERATIVE DIAGNOSIS: Right forearm mass   PROCEDURE: Excision mass right forearm, subcutaneous, 13 mm   SURGEON:  Leanora Cover, M.D.   ASSISTANT: none   ANESTHESIA:  Bier block with sedation   INTRAVENOUS FLUIDS:  Per anesthesia flow sheet.   ESTIMATED BLOOD LOSS:  Minimal.   COMPLICATIONS:  None.   SPECIMENS: Right forearm mass to pathology   TOURNIQUET TIME:    Total Tourniquet Time Documented: Forearm (Right) - 16 minutes Total: Forearm (Right) - 16 minutes    DISPOSITION:  Stable to PACU.   INDICATIONS: 62 year old male with mass in right forearm.  It is bothersome to him.  He wishes to have it removed.  Risks, benefits and alternatives of surgery were discussed including the risks of blood loss, infection, damage to nerves, vessels, tendons, ligaments, bone for surgery, need for additional surgery, complications with wound healing, continued pain, stiffness, , recurrence.  He voiced understanding of these risks and elected to proceed.  OPERATIVE COURSE:  After being identified preoperatively by myself,  the patient and I agreed on the procedure and site of the procedure.  The surgical site was marked.  Surgical consent had been signed. Preoperative IV antibiotic prophylaxis was given. He was transferred to the operating room and placed on the operating table in supine position with the Right upper extremity on an arm board.  Bier block anesthesia was induced by the anesthesiologist.  Right upper extremity was prepped and draped in normal sterile orthopedic fashion.  A surgical pause was performed between the surgeons, anesthesia, and operating room staff and all were in agreement as to the patient, procedure, and  site of procedure.  Tourniquet at the proximal aspect of the forearm had been inflated for the Bier block.  Longitudinal incision was made over the mass on the dorsum of the right forearm.  This was carried in subcutaneous tissues by spreading technique.  Mass was easily identified.  It was the appearance of fat.  It was well circumscribed.  Was carefully freed up from surrounding tissues.  It was adherent to the underlying fascia but not through it.  The mass was removed.  It was approximately 13 mm in diameter.  It was sent to pathology for examination.  The area was palpated and no remaining mass was noted.  The wound was copiously irrigated with sterile saline and closed with 4-0 nylon in a horizontal mattress fashion.  It was injected with quarter percent plain Marcaine to aid in postoperative analgesia.  It was dressed with sterile Xeroform 4 x 4s and wrapped with Kerlix and Ace bandage.  The tourniquet was deflated at 16 minutes.  Fingertips were pink with brisk capillary refill after deflation of tourniquet.  The operative  drapes were broken down.  The patient was awoken from anesthesia safely.  He was transferred back to the stretcher and taken to PACU in stable condition.  I will see him back in the office in 1 week for postoperative followup.  I will give him a prescription for Norco 5/325 1-2 tabs PO q6 hours prn pain, dispense # 15.   Leanora Cover, MD Electronically signed, 08/08/22

## 2022-08-08 NOTE — Anesthesia Preprocedure Evaluation (Signed)
Anesthesia Evaluation  Patient identified by MRN, date of birth, ID band Patient awake    Reviewed: Allergy & Precautions, NPO status , Patient's Chart, lab work & pertinent test results  History of Anesthesia Complications Negative for: history of anesthetic complications  Airway Mallampati: II  TM Distance: >3 FB Neck ROM: Full    Dental  (+) Dental Advisory Given, Teeth Intact   Pulmonary neg shortness of breath, asthma , neg sleep apnea, neg recent URI, Current Smoker and Patient abstained from smoking.   breath sounds clear to auscultation       Cardiovascular hypertension, Pt. on medications and Pt. on home beta blockers (-) angina (-) CAD and (-) Past MI + dysrhythmias Atrial Fibrillation  Rhythm:Regular     Neuro/Psych  Headaches PSYCHIATRIC DISORDERS Anxiety Depression Bipolar Disorder Schizophrenia   Neuromuscular disease CVA, No Residual Symptoms    GI/Hepatic Neg liver ROS,GERD  ,,  Endo/Other  diabetes    Renal/GU negative Renal ROS     Musculoskeletal  (+) Arthritis ,    Abdominal   Peds  Hematology negative hematology ROS (+)   Anesthesia Other Findings   Reproductive/Obstetrics                             Anesthesia Physical Anesthesia Plan  ASA: 2  Anesthesia Plan: MAC and Bier Block and Bier Block-Lidocaine Only   Post-op Pain Management:    Induction: Intravenous  PONV Risk Score and Plan: 0 and Propofol infusion and Treatment may vary due to age or medical condition  Airway Management Planned: Natural Airway and Nasal Cannula  Additional Equipment: None  Intra-op Plan:   Post-operative Plan:   Informed Consent: I have reviewed the patients History and Physical, chart, labs and discussed the procedure including the risks, benefits and alternatives for the proposed anesthesia with the patient or authorized representative who has indicated his/her understanding  and acceptance.     Dental advisory given  Plan Discussed with: CRNA  Anesthesia Plan Comments:        Anesthesia Quick Evaluation

## 2022-08-09 ENCOUNTER — Encounter (HOSPITAL_BASED_OUTPATIENT_CLINIC_OR_DEPARTMENT_OTHER): Payer: Self-pay | Admitting: Orthopedic Surgery

## 2022-08-09 DIAGNOSIS — I480 Paroxysmal atrial fibrillation: Secondary | ICD-10-CM | POA: Diagnosis not present

## 2022-08-09 LAB — SURGICAL PATHOLOGY

## 2022-08-10 ENCOUNTER — Encounter (HOSPITAL_BASED_OUTPATIENT_CLINIC_OR_DEPARTMENT_OTHER): Payer: Self-pay | Admitting: Orthopedic Surgery

## 2022-08-10 DIAGNOSIS — I480 Paroxysmal atrial fibrillation: Secondary | ICD-10-CM | POA: Diagnosis not present

## 2022-08-10 NOTE — Anesthesia Postprocedure Evaluation (Signed)
Anesthesia Post Note  Patient: AAMIR MCLINDEN  Procedure(s) Performed: EXCISION MASS RIGHT FOREARM (Right: Arm Lower)     Patient location during evaluation: PACU Anesthesia Type: MAC and Bier Block Level of consciousness: awake and alert Pain management: pain level controlled Vital Signs Assessment: post-procedure vital signs reviewed and stable Respiratory status: spontaneous breathing, nonlabored ventilation and respiratory function stable Cardiovascular status: stable and blood pressure returned to baseline Postop Assessment: no apparent nausea or vomiting Anesthetic complications: no   No notable events documented.  Last Vitals:  Vitals:   08/08/22 1445 08/08/22 1506  BP: (!) 147/98 (!) 127/92  Pulse: 75 79  Resp: 20 16  Temp:  36.8 C  SpO2: 94% 98%    Last Pain:  Vitals:   08/09/22 0851  TempSrc:   PainSc: 0-No pain                 Oliana Gowens

## 2022-08-11 ENCOUNTER — Ambulatory Visit (INDEPENDENT_AMBULATORY_CARE_PROVIDER_SITE_OTHER): Payer: Medicaid Other

## 2022-08-11 DIAGNOSIS — J309 Allergic rhinitis, unspecified: Secondary | ICD-10-CM | POA: Diagnosis not present

## 2022-08-11 DIAGNOSIS — I480 Paroxysmal atrial fibrillation: Secondary | ICD-10-CM | POA: Diagnosis not present

## 2022-08-12 DIAGNOSIS — I480 Paroxysmal atrial fibrillation: Secondary | ICD-10-CM | POA: Diagnosis not present

## 2022-08-13 DIAGNOSIS — I480 Paroxysmal atrial fibrillation: Secondary | ICD-10-CM | POA: Diagnosis not present

## 2022-08-14 DIAGNOSIS — I480 Paroxysmal atrial fibrillation: Secondary | ICD-10-CM | POA: Diagnosis not present

## 2022-08-15 ENCOUNTER — Telehealth: Payer: Self-pay

## 2022-08-15 DIAGNOSIS — I480 Paroxysmal atrial fibrillation: Secondary | ICD-10-CM | POA: Diagnosis not present

## 2022-08-15 NOTE — Telephone Encounter (Signed)
Called the requesting office to get more details about procedure.  Patient is having a Left C7, T1 interlaminar epidural steroid injection

## 2022-08-15 NOTE — Telephone Encounter (Signed)
..  Pre-operative Risk Assessment    Patient Name: Jason Stokes  DOB: [birth year] MRN: [id number]      Request for Surgical Clearance    Procedure:   spine procedure  Date of Surgery:  Clearance TBD                                 Surgeon:  dr Ernestina Patches Surgeon's Group or Practice Name:  [facility] Phone number:  [phone] Fax number:  [phone]   Type of Clearance Requested:   - Medical  - Pharmacy:  Hold Apixaban (Eliquis)     Type of Anesthesia:  Not Indicated   Additional requests/questions:    Gwenlyn Found   08/15/2022, 3:22 PM

## 2022-08-16 ENCOUNTER — Encounter: Payer: Self-pay | Admitting: Family Medicine

## 2022-08-16 ENCOUNTER — Ambulatory Visit: Payer: Medicaid Other | Attending: Family Medicine | Admitting: Family Medicine

## 2022-08-16 VITALS — BP 111/74 | HR 97 | Temp 98.0°F | Ht 73.0 in | Wt 238.0 lb

## 2022-08-16 DIAGNOSIS — M542 Cervicalgia: Secondary | ICD-10-CM | POA: Diagnosis not present

## 2022-08-16 DIAGNOSIS — J45909 Unspecified asthma, uncomplicated: Secondary | ICD-10-CM | POA: Diagnosis not present

## 2022-08-16 DIAGNOSIS — I10 Essential (primary) hypertension: Secondary | ICD-10-CM | POA: Insufficient documentation

## 2022-08-16 DIAGNOSIS — F17210 Nicotine dependence, cigarettes, uncomplicated: Secondary | ICD-10-CM | POA: Insufficient documentation

## 2022-08-16 DIAGNOSIS — L0232 Furuncle of buttock: Secondary | ICD-10-CM | POA: Diagnosis not present

## 2022-08-16 DIAGNOSIS — E1140 Type 2 diabetes mellitus with diabetic neuropathy, unspecified: Secondary | ICD-10-CM | POA: Diagnosis present

## 2022-08-16 DIAGNOSIS — G8929 Other chronic pain: Secondary | ICD-10-CM | POA: Diagnosis not present

## 2022-08-16 DIAGNOSIS — M5416 Radiculopathy, lumbar region: Secondary | ICD-10-CM | POA: Insufficient documentation

## 2022-08-16 DIAGNOSIS — Z7951 Long term (current) use of inhaled steroids: Secondary | ICD-10-CM | POA: Diagnosis not present

## 2022-08-16 DIAGNOSIS — E1169 Type 2 diabetes mellitus with other specified complication: Secondary | ICD-10-CM | POA: Diagnosis not present

## 2022-08-16 DIAGNOSIS — I480 Paroxysmal atrial fibrillation: Secondary | ICD-10-CM | POA: Diagnosis not present

## 2022-08-16 DIAGNOSIS — Z79899 Other long term (current) drug therapy: Secondary | ICD-10-CM | POA: Diagnosis not present

## 2022-08-16 DIAGNOSIS — L0292 Furuncle, unspecified: Secondary | ICD-10-CM | POA: Diagnosis not present

## 2022-08-16 DIAGNOSIS — M4722 Other spondylosis with radiculopathy, cervical region: Secondary | ICD-10-CM | POA: Diagnosis not present

## 2022-08-16 DIAGNOSIS — F319 Bipolar disorder, unspecified: Secondary | ICD-10-CM | POA: Diagnosis not present

## 2022-08-16 DIAGNOSIS — Z125 Encounter for screening for malignant neoplasm of prostate: Secondary | ICD-10-CM

## 2022-08-16 DIAGNOSIS — Z7901 Long term (current) use of anticoagulants: Secondary | ICD-10-CM | POA: Diagnosis not present

## 2022-08-16 DIAGNOSIS — Z23 Encounter for immunization: Secondary | ICD-10-CM | POA: Diagnosis not present

## 2022-08-16 LAB — POCT GLYCOSYLATED HEMOGLOBIN (HGB A1C): HbA1c, POC (controlled diabetic range): 8.1 % — AB (ref 0.0–7.0)

## 2022-08-16 MED ORDER — DULOXETINE HCL 60 MG PO CPEP: 60.0000 mg | ORAL_CAPSULE | Freq: Every day | ORAL | 1 refills | Status: DC

## 2022-08-16 MED ORDER — ATORVASTATIN CALCIUM 40 MG PO TABS: 40.0000 mg | ORAL_TABLET | Freq: Every day | ORAL | 1 refills | Status: DC

## 2022-08-16 MED ORDER — METFORMIN HCL 500 MG PO TABS: 500.0000 mg | ORAL_TABLET | Freq: Two times a day (BID) | ORAL | 1 refills | Status: DC

## 2022-08-16 MED ORDER — DOXYCYCLINE HYCLATE 100 MG PO TABS: 100.0000 mg | ORAL_TABLET | Freq: Two times a day (BID) | ORAL | 0 refills | Status: DC

## 2022-08-16 NOTE — Telephone Encounter (Signed)
Patient with diagnosis of AFib on Eliquis for anticoagulation.    Procedure: Left C7, T1 interlaminar epidural steroid injection  Date of procedure: TBD   CHA2DS2-VASc Score = 5  This indicates a 7.2% annual risk of stroke. The patient's score is based upon: CHF History: 0 HTN History: 1 Diabetes History: 1 Stroke History: 2 Vascular Disease History: 1 Age Score: 0 Gender Score: 0    CrCl 111 mL/min using adj body weight Platelet count 168K.  Will need to hold Eliquis for 3 days prior to spinal injection. Due to history of CVA, will route to Dr Jason Stokes for input.  **This guidance is not considered finalized until pre-operative APP has relayed final recommendations.**

## 2022-08-16 NOTE — Telephone Encounter (Signed)
   Primary Cardiologist: Will [name], MD  Chart reviewed as part of pre-operative protocol coverage. Given past medical history and time since last visit, based on ACC/AHA guidelines, Jason Stokes would be at acceptable risk for the planned procedure without further cardiovascular testing.   He has been cleared by primary cardiologist to hold Eliquis for 3 days for spinal injection.  He should resume as soon as hemodynamically stable following the procedure.  I will route this recommendation to the requesting party via Epic fax function and remove from pre-op pool.  Please call with questions.  Emmaline Life, NP-C  08/16/2022, 12:15 PM [address], [address] Office [phone] Fax [phone]

## 2022-08-16 NOTE — Patient Instructions (Signed)
Skin Abscess  A skin abscess is an infected area on or under your skin that contains a collection of pus and other material. An abscess may also be called a furuncle, carbuncle, or boil. An abscess can occur in or on almost any part of your body. Some abscesses break open (rupture) on their own. Most continue to get worse unless they are treated. The infection can spread deeper into the body and eventually into your blood, which can make you feel ill. Treatment usually involves draining the abscess. What are the causes? An abscess occurs when germs, like bacteria, pass through your skin and cause an infection. This may be caused by: A scrape or cut on your skin. A puncture wound through your skin, including a needle injection or insect bite. Blocked oil or sweat glands. Blocked and infected hair follicles. A cyst that forms beneath your skin (sebaceous cyst) and becomes infected. What increases the risk? This condition is more likely to develop in people who: Have a weak body defense system (immune system). Have diabetes. Have dry and irritated skin. Get frequent injections or use illegal IV drugs. Have a foreign body in a wound, such as a splinter. Have problems with their lymph system or veins. What are the signs or symptoms? Symptoms of this condition include: A painful, firm bump under the skin. A bump with pus at the top. This may break through the skin and drain. Other symptoms include: Redness surrounding the abscess site. Warmth. Swelling of the lymph nodes (glands) near the abscess. Tenderness. A sore on the skin. How is this diagnosed? This condition may be diagnosed based on: A physical exam. Your medical history. A sample of pus. This may be used to find out what is causing the infection. Blood tests. Imaging tests, such as an ultrasound, CT scan, or MRI. How is this treated? A small abscess that drains on its own may not need treatment. Treatment for larger abscesses  may include: Moist heat or heat pack applied to the area several times a day. A procedure to drain the abscess (incision and drainage). Antibiotic medicines. For a severe abscess, you may first get antibiotics through an IV and then change to antibiotics by mouth. Follow these instructions at home: Medicines  Take over-the-counter and prescription medicines only as told by your health care provider. If you were prescribed an antibiotic medicine, take it as told by your health care provider. Do not stop taking the antibiotic even if you start to feel better. Abscess care  If you have an abscess that has not drained, apply heat to the affected area. Use the heat source that your health care provider recommends, such as a moist heat pack or a heating pad. Place a towel between your skin and the heat source. Leave the heat on for 20-30 minutes. Remove the heat if your skin turns bright red. This is especially important if you are unable to feel pain, heat, or cold. You may have a greater risk of getting burned. Follow instructions from your health care provider about how to take care of your abscess. Make sure you: Cover the abscess with a bandage (dressing). Change your dressing or gauze as told by your health care provider. Wash your hands with soap and water before you change the dressing or gauze. If soap and water are not available, use hand sanitizer. Check your abscess every day for signs of a worsening infection. Check for: More redness, swelling, or pain. More fluid or blood. Warmth.   More pus or a bad smell. General instructions To avoid spreading the infection: Do not share personal care items, towels, or hot tubs with others. Avoid making skin contact with other people. Keep all follow-up visits as told by your health care provider. This is important. Contact a health care provider if you have: More redness, swelling, or pain around your abscess. More fluid or blood coming from  your abscess. Warm skin around your abscess. More pus or a bad smell coming from your abscess. Muscle aches. Chills or a general ill feeling. Get help right away if you: Have severe pain. See red streaks on your skin spreading away from the abscess. See redness that spreads quickly. Have a fever or chills. Summary A skin abscess is an infected area on or under your skin that contains a collection of pus and other material. A small abscess that drains on its own may not need treatment. Treatment for larger abscesses may include having a procedure to drain the abscess and taking an antibiotic. This information is not intended to replace advice given to you by your health care provider. Make sure you discuss any questions you have with your health care provider. Document Revised: 10/07/2021 Document Reviewed: 04/12/2021 Elsevier Patient Education  2023 [employer]  

## 2022-08-16 NOTE — Progress Notes (Signed)
Subjective:  Patient ID: Jason Stokes, male    DOB: 09/10/1960  Age: 62 y.o. MRN: [id number]  CC: Hospitalization Follow-up and Diabetes   Jason Stokes is a 62 y.o. year old male with a history of hypertension, type 2 diabetes mellitus (diet-controlled A1c 8.1), bipolar disorder, paroxysmal A. 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  He remains on Eliquis.  He has had recurrent boils on his buttocks which burst then reoccur. He has had urgent care visit and was was informed he needed to see a Surgeon. He has been sitting in water with epsom salt.  Denies presence of fever.  A1c is 8.1 up from 5.8.  In the past he was on medications for diabetes but he refused this as he stated his previous diagnosis of diabetes mellitus was erroneous.  He does have neuropathy in his legs but also has lumbar radiculopathy.  He has not been checking his sugars as he does not have test strips but also does not know the name of his meter. Ophthalmology appointment comes up early next month.  His Neurosurgeon is Dr  Jason Stokes and he saw him today for a visit with plans for possible epidural spinal injection in his cervical spine.  He is currently wearing a soft neck collar.  He complains of pain in his neck and inflammation and is currently not on any pain medication.  Previously followed by Dr.  Romelle Stokes pain management but states " he did not agree with him".  Referral was also placed to [facility] and the patient said that he was called by them but is not able to provide me with a concrete answer as to why he was not seen. Past Medical History:  Diagnosis Date   Anxiety    Bilateral carpal tunnel syndrome  01/10/2018   Bipolar disorder (HCC)    Chronic lower back pain    Depression    Dysrhythmia    a-fib   GERD (gastroesophageal reflux disease)    History of alcohol abuse    History of nuclear stress test    Myoview 10/16: EF 50%, diaphragmatic attenuation, no ischemia, low risk   Hypertension    Migraine 2012-2014   Moderate persistent asthma with acute exacerbation 05/02/2018   PAF (paroxysmal atrial fibrillation) ([place]) 03/27/2015   a. Myoview neg for ischemia >> Flecainide started 10/16 >> FU ETT    Schizophrenia ([place])    Sciatica neuralgia    Small vessel disease ([place])    Right basal ganglia stroke   Stroke [facility]) "between 2012-2014"   residual "AF" (09/22/2015)    Past Surgical History:  Procedure Laterality Date   ATRIAL FIBRILLATION ABLATION  09/22/2015   ATRIAL FIBRILLATION ABLATION N/A 04/28/2022   Procedure: ATRIAL FIBRILLATION ABLATION;  Surgeon: Jason Haw, MD;  Location: [place] [facility];  Service: Cardiovascular;  Laterality: N/A;   CYST EXCISION  1996-97   surgery back of head    ELECTROPHYSIOLOGIC STUDY N/A 09/22/2015   Procedure: Atrial Fibrillation Ablation;  Surgeon: Jason [name], MD;  Location: [place] [facility];  Service: Cardiovascular;  Laterality: N/A;   ELECTROPHYSIOLOGIC STUDY N/A 12/10/2015   Procedure: Atrial Fibrillation Ablation;  Surgeon: Jason [employer],  MD;  Location: [place] [facility];  Service: Cardiovascular;  Laterality: N/A;   ELECTROPHYSIOLOGIC STUDY N/A 12/11/2015   Procedure: Cardioversion;  Surgeon: Jason [name], MD;  Location: [place] [facility];  Service: Cardiovascular;  Laterality: N/A;   EMBOLIZATION Right 08/19/2020   Procedure: EMBOLIZATION;  Surgeon: Jason Robins, MD;  Location: [place] [facility];  Service: Cardiovascular;  Laterality: Right;  hypogastric   EXCISION MASS HEAD N/A 01/06/2017   Procedure: EXCISION MASS FOREHEAD;  Surgeon: Jason Limbo, MD;  Location: [place];   Service: Plastics;  Laterality: N/A;   EXCISION MASS UPPER EXTREMETIES Right 08/08/2022   Procedure: EXCISION MASS RIGHT FOREARM;  Surgeon: Jason Cover, MD;  Location: [place];  Service: Orthopedics;  Laterality: Right;  45 MIN   GANGLION CYST EXCISION Left    INTERCOSTAL NERVE BLOCK  2005   KNEE ARTHROSCOPY Right 2016   MASS EXCISION N/A 01/25/2021   Procedure: EXCISION SUBCUTANEOUS VS SUBFASCIAL MASS TORSO 3CM;  Surgeon: Jason Limbo, MD;  Location: [place];  Service: Plastics;  Laterality: N/A;    Family History  Problem Relation Age of Onset   Heart disease Father    Schizophrenia Sister     Social History   Socioeconomic History   Marital status: Single    Spouse name: Jason Stokes   Number of children: 3   Years of education: HS   Highest education level: Not on file  Occupational History    Comment: disabled  Tobacco Use   Smoking status: Every Day    Years: 29.00    Types: Cigarettes   Smokeless tobacco: Never   Tobacco comments:    1 cigarettes every day  06-02-2021  Vaping Use   Vaping Use: Never used  Substance and Sexual Activity   Alcohol use: Not Currently    Alcohol/week: 6.0 standard drinks of alcohol    Types: 6 Cans of beer per week    Comment: 6 pack weekly 05/26/22   Drug use: Yes    Types: Marijuana    Comment: smoked marijuana 01-02-17 for pain   Sexual activity: Not Currently  Other Topics Concern   Not on file  Social History Narrative   Patient lives at home with [place].    Patient has 3 children 2 step.    Patient has 13 years of schooling.    Patient is right handed.    Social Determinants of Health   Financial Resource Strain: Not on file  Food Insecurity: Not on file  Transportation Needs: Not on file  Physical Activity: Not on file  Stress: Not on file  Social Connections: Not on file    Allergies  Allergen Reactions   Lisinopril Anaphylaxis    Swelling of lips and tongue.   Tylenol  [Acetaminophen] Other (See Comments)    Acid reflux     Outpatient Medications Prior to Visit  Medication Sig Dispense Refill   albuterol (VENTOLIN HFA) 108 (90 Base) MCG/ACT inhaler Inhale 2 puffs into the lungs every 4 (four) hours as needed for wheezing or shortness of breath. 54 g 1   apixaban (ELIQUIS) 5 MG TABS tablet Take 1 tablet (5 mg total) by mouth 2 (two) times daily. 60 tablet 5   azaTHIOprine (IMURAN) 50 MG tablet Take 100 mg by mouth daily.     azelastine (ASTELIN) 0.1 % nasal spray 1 spray each nostril twice a day 30 mL 5   brimonidine (ALPHAGAN) 0.2 % ophthalmic solution Place  1 drop into both eyes every 8 (eight) hours.     carvedilol (COREG) 25 MG tablet Take 1 tablet (25 mg total) by mouth 2 (two) times daily. 180 tablet 2   cetirizine (ZYRTEC) 10 MG tablet Take 1 tablet (10 mg total) by mouth 2 (two) times daily as needed for allergies (Can take an extra dose during flare ups.). 60 tablet 5   diclofenac Sodium (VOLTAREN) 1 % GEL Apply 1 Application topically 4 (four) times daily as needed (pain).     Difluprednate 0.05 % EMUL Place 1 drop into both eyes 2 (two) times daily.     diltiazem (CARDIZEM CD) 180 MG 24 hr capsule TAKE 1 CAPSULE BY MOUTH EVERY DAY 90 capsule 2   dorzolamide-timolol (COSOPT) 22.3-6.8 MG/ML ophthalmic solution Place 1 drop into both eyes 2 (two) times daily.     EPINEPHrine 0.3 mg/0.3 mL IJ SOAJ injection Inject 0.3 mg into the muscle as needed for anaphylaxis. 0.3 mL 1   fluticasone (FLONASE) 50 MCG/ACT nasal spray 1 spray each nostril twice a day 16 g 5   fluticasone furoate-vilanterol (BREO ELLIPTA) 200-25 MCG/ACT AEPB Inhale 1 puff into the lungs daily. 28 each 5   folic acid (FOLVITE) 1 MG tablet Take 1 mg by mouth daily.     GARLIC PO Take 1 tablet by mouth daily.     HYDROcodone-acetaminophen (NORCO/VICODIN) 5-325 MG tablet 1-2 tabs PO q6 hours prn pain 15 tablet 0   latanoprost (XALATAN) 0.005 % ophthalmic solution 1 drop daily.      methocarbamol (ROBAXIN) 500 MG tablet TAKE 1 TABLET BY MOUTH EVERY 8 HOURS AS NEEDED FOR MUSCLE SPASM 30 tablet 0   Misc. Devices MISC Back brace. Diagnosis chronic back pain 1 each 0   Misc. Devices MISC Left knee brace. Diagnosis L knee pain 1 each 0   montelukast (SINGULAIR) 10 MG tablet Take 1 tablet (10 mg total) by mouth at bedtime. 30 tablet 5   Olopatadine HCl (PATADAY) 0.2 % SOLN Place 1 drop into both eyes daily as needed. 2.5 mL 5   omeprazole (PRILOSEC) 20 MG capsule TAKE 1 CAPSULE BY MOUTH EVERY DAY 90 capsule 0   Vitamin D3 (VITAMIN D) 25 MCG tablet Take 1,000 Units by mouth daily.     atorvastatin (LIPITOR) 40 MG tablet Take 1 tablet (40 mg total) by mouth daily. 90 tablet 1   hydrALAZINE (APRESOLINE) 100 MG tablet TAKE 1 TABLET (100 MG TOTAL) BY MOUTH 3 (THREE) TIMES DAILY. 270 tablet 2   No facility-administered medications prior to visit.     ROS Review of Systems  Constitutional:  Negative for activity change and appetite change.  HENT:  Negative for sinus pressure and sore throat.   Respiratory:  Negative for chest tightness, shortness of breath and wheezing.   Cardiovascular:  Negative for chest pain and palpitations.  Gastrointestinal:  Negative for abdominal distention, abdominal pain and constipation.  Genitourinary: Negative.   Musculoskeletal:  Positive for neck pain.  Skin:        See Jason  Neurological:  Positive for numbness.  Psychiatric/Behavioral:  Negative for behavioral problems and dysphoric mood.     Objective:  BP 111/74   Pulse 97   Temp 98 F (36.7 C) (Oral)   Ht '6\' 1"'$  (1.854 m)   Wt 238 lb (108 kg)   SpO2 97%   BMI 31.40 kg/m      08/16/2022    2:51 PM 08/08/2022    3:06 PM 08/08/2022  2:45 PM  BP/Weight  Systolic BP 505 397 673  Diastolic BP 74 92 98  Wt. (Lbs) 238    BMI 31.4 kg/m2        Physical Exam Constitutional:      Appearance: He is well-developed.  Neck:     Comments: Cervical spine soft collar with restricted  range of motion Cardiovascular:     Rate and Rhythm: Normal rate.     Heart sounds: Normal heart sounds. No murmur heard. Pulmonary:     Effort: Pulmonary effort is normal.     Breath sounds: Normal breath sounds. No wheezing or rales.  Chest:     Chest wall: No tenderness.  Abdominal:     General: Bowel sounds are normal. There is no distension.     Palpations: Abdomen is soft. There is no mass.     Tenderness: There is no abdominal tenderness.  Musculoskeletal:        General: Normal range of motion.     Right lower leg: No edema.     Left lower leg: No edema.  Neurological:     Mental Status: He is alert and oriented to person, place, and time.  Psychiatric:        Mood and Affect: Mood normal.    Diabetic Foot Exam - Simple   Simple Foot Form Diabetic Foot exam was performed with the following findings: Yes 08/16/2022  3:28 PM  Visual Inspection See comments: Yes Sensation Testing Intact to touch and monofilament testing bilaterally: Yes Pulse Check Posterior Tibialis and Dorsalis pulse intact bilaterally: Yes Comments Thickened great toenails bilaterally with associated discoloration        Latest Ref Rng & Units 06/23/2022   10:57 AM 05/26/2022    2:53 PM 04/15/2022   12:26 PM  CMP  Glucose 70 - 99 mg/dL 131  395  145   BUN 8 - 23 mg/dL '5  8  14   '$ Creatinine 0.61 - 1.24 mg/dL 0.97  0.90  1.25   Sodium 135 - 145 mmol/L 140  134  141   Potassium 3.5 - 5.1 mmol/L 3.5  3.8  3.9   Chloride 98 - 111 mmol/L 110  100  103   CO2 22 - 32 mmol/L '22  22  24   '$ Calcium 8.9 - 10.3 mg/dL 8.9  9.2  9.6   Total Protein 6.5 - 8.1 g/dL 6.4  6.9    Total Bilirubin 0.3 - 1.2 mg/dL 0.4  1.0    Alkaline Phos 38 - 126 U/L 110  466    AST 15 - 41 U/L 16  52    ALT 0 - 44 U/L 13  69      Lipid Panel     Component Value Date/Time   CHOL 171 05/07/2018 1004   TRIG 204 (H) 05/07/2018 1004   HDL 33 (L) 05/07/2018 1004   CHOLHDL 5.2 (H) 05/07/2018 1004   CHOLHDL 5.4 10/17/2013  1042   VLDL 36 10/17/2013 1042   LDLCALC 97 05/07/2018 1004    CBC    Component Value Date/Time   WBC 6.6 04/15/2022 1226   WBC 5.8 08/08/2019 1430   RBC 4.74 04/15/2022 1226   RBC 5.90 (H) 08/08/2019 1430   HGB 12.8 (L) 04/15/2022 1226   HCT 40.6 04/15/2022 1226   PLT 168 04/15/2022 1226   MCV 86 04/15/2022 1226   MCH 27.0 04/15/2022 1226   MCH 24.9 (L) 08/08/2019 1430   MCHC 31.5 04/15/2022 1226  MCHC 31.5 08/08/2019 1430   RDW 16.4 (H) 04/15/2022 1226   LYMPHSABS 1.6 08/08/2019 1430   LYMPHSABS 2.0 09/13/2016 0832   MONOABS 0.7 08/08/2019 1430   EOSABS 0.2 08/08/2019 1430   EOSABS 0.1 09/13/2016 0832   BASOSABS 0.0 08/08/2019 1430   BASOSABS 0.0 09/13/2016 3716    Lab Results  Component Value Date   HGBA1C 8.1 (A) 08/16/2022    Assessment & Plan:  1. Type 2 diabetes mellitus with other specified complication, without long-term current use of insulin (HCC) Uncontrolled with A1c of 8.1, goal is less than 7.0 Jason place on metformin He Jason send me a message with the name of his glucometer so I can send in testing supplies for him Ophthalmology exam comes up in the next couple weeks Counseled on Diabetic diet, my plate method, 967 minutes of moderate intensity exercise/week Blood sugar logs with fasting goals of 80-120 mg/dl, random of less than 180 and in the event of sugars less than 60 mg/dl or greater than 400 mg/dl encouraged to notify the clinic. Advised on the need for annual eye exams, annual foot exams, Pneumonia vaccine. - POCT glycosylated hemoglobin (Hb A1C) - Microalbumin/Creatinine Ratio, Urine; Future - metFORMIN (GLUCOPHAGE) 500 MG tablet; Take 1 tablet (500 mg total) by mouth 2 (two) times daily with a meal. For 1 week then increase to 2 tablets twice a day  Dispense: 360 tablet; Refill: 1 - atorvastatin (LIPITOR) 40 MG tablet; Take 1 tablet (40 mg total) by mouth daily.  Dispense: 90 tablet; Refill: 1 - LP+Non-HDL Cholesterol; Future  2. Recurrent  boils Continue with warm compress - Ambulatory referral to General Surgery - doxycycline (VIBRA-TABS) 100 MG tablet; Take 1 tablet (100 mg total) by mouth 2 (two) times daily.  Dispense: 14 tablet; Refill: 0  4. Screening for prostate cancer - PSA, total and free; Future  5. Paroxysmal atrial fibrillation (HCC) Currently in sinus rhythm Continue anticoagulation with Eliquis Rate control with carvedilol Continue to follow-up with EP  6. Lumbar radiculopathy Uncontrolled Jason place on Cymbalta He states he did not do well on gabapentin in the past - DULoxetine (CYMBALTA) 60 MG capsule; Take 1 capsule (60 mg total) by mouth daily. For chronic pain and neuropathy  Dispense: 90 capsule; Refill: 1 - Ambulatory referral to Pain Clinic  7. Neck pain Uncontrolled Currently under neurosurgery care with plans for [facility] Jason place on Cymbalta - DULoxetine (CYMBALTA) 60 MG capsule; Take 1 capsule (60 mg total) by mouth daily. For chronic pain and neuropathy  Dispense: 90 capsule; Refill: 1  Health Care Maintenance: had colon[facility] on 12/16/21 at [facility], we Jason need to obtain report to update his chart. Meds ordered this encounter  Medications   metFORMIN (GLUCOPHAGE) 500 MG tablet    Sig: Take 1 tablet (500 mg total) by mouth 2 (two) times daily with a meal. For 1 week then increase to 2 tablets twice a day    Dispense:  360 tablet    Refill:  1   doxycycline (VIBRA-TABS) 100 MG tablet    Sig: Take 1 tablet (100 mg total) by mouth 2 (two) times daily.    Dispense:  14 tablet    Refill:  0   atorvastatin (LIPITOR) 40 MG tablet    Sig: Take 1 tablet (40 mg total) by mouth daily.    Dispense:  90 tablet    Refill:  1   DULoxetine (CYMBALTA) 60 MG capsule    Sig: Take 1 capsule (60 mg  total) by mouth daily. For chronic pain and neuropathy    Dispense:  90 capsule    Refill:  1    Follow-up: Return in about 3 months (around 11/15/2022) for Chronic medical conditions.       Charlott Rakes, MD, FAAFP. [facility] and [facility] [place], [place]   08/16/2022, 5:11 PM

## 2022-08-16 NOTE — Progress Notes (Signed)
Referral to general surgery for recurrent boil on buttocks.

## 2022-08-17 DIAGNOSIS — I480 Paroxysmal atrial fibrillation: Secondary | ICD-10-CM | POA: Diagnosis not present

## 2022-08-18 ENCOUNTER — Ambulatory Visit (INDEPENDENT_AMBULATORY_CARE_PROVIDER_SITE_OTHER): Payer: Medicaid Other

## 2022-08-18 DIAGNOSIS — J309 Allergic rhinitis, unspecified: Secondary | ICD-10-CM | POA: Diagnosis not present

## 2022-08-18 DIAGNOSIS — I480 Paroxysmal atrial fibrillation: Secondary | ICD-10-CM | POA: Diagnosis not present

## 2022-08-19 DIAGNOSIS — I480 Paroxysmal atrial fibrillation: Secondary | ICD-10-CM | POA: Diagnosis not present

## 2022-08-20 DIAGNOSIS — I480 Paroxysmal atrial fibrillation: Secondary | ICD-10-CM | POA: Diagnosis not present

## 2022-08-21 DIAGNOSIS — I480 Paroxysmal atrial fibrillation: Secondary | ICD-10-CM | POA: Diagnosis not present

## 2022-08-22 ENCOUNTER — Ambulatory Visit: Payer: Medicaid Other | Attending: Family Medicine

## 2022-08-22 DIAGNOSIS — I480 Paroxysmal atrial fibrillation: Secondary | ICD-10-CM | POA: Diagnosis not present

## 2022-08-22 DIAGNOSIS — E1169 Type 2 diabetes mellitus with other specified complication: Secondary | ICD-10-CM | POA: Diagnosis not present

## 2022-08-22 DIAGNOSIS — Z125 Encounter for screening for malignant neoplasm of prostate: Secondary | ICD-10-CM | POA: Diagnosis not present

## 2022-08-23 ENCOUNTER — Ambulatory Visit (INDEPENDENT_AMBULATORY_CARE_PROVIDER_SITE_OTHER): Payer: Medicaid Other

## 2022-08-23 DIAGNOSIS — J309 Allergic rhinitis, unspecified: Secondary | ICD-10-CM

## 2022-08-23 DIAGNOSIS — I480 Paroxysmal atrial fibrillation: Secondary | ICD-10-CM | POA: Diagnosis not present

## 2022-08-24 ENCOUNTER — Ambulatory Visit: Payer: Medicaid Other | Admitting: Physical Medicine and Rehabilitation

## 2022-08-24 ENCOUNTER — Ambulatory Visit: Payer: Self-pay

## 2022-08-24 VITALS — BP 134/78 | HR 90

## 2022-08-24 DIAGNOSIS — I480 Paroxysmal atrial fibrillation: Secondary | ICD-10-CM | POA: Diagnosis not present

## 2022-08-24 DIAGNOSIS — M5412 Radiculopathy, cervical region: Secondary | ICD-10-CM

## 2022-08-24 MED ORDER — METHYLPREDNISOLONE ACETATE 80 MG/ML IJ SUSP
80.0000 mg | Freq: Once | INTRAMUSCULAR | Status: AC
  Administered 2022-08-24: 80 mg

## 2022-08-24 NOTE — Progress Notes (Signed)
Functional Pain Scale - descriptive words and definitions   Severe (9)  Cannot do any ADL's even with assistance can barely talk/unable to sleep and unable to use distraction. Severe range order  Average Pain 9   +Driver, -BT, -Dye Allergies- stopped Eliquis 08/22/22.  Neck pain on both sides and radiates down the arms

## 2022-08-24 NOTE — Patient Instructions (Signed)

## 2022-08-25 ENCOUNTER — Other Ambulatory Visit: Payer: Self-pay

## 2022-08-25 ENCOUNTER — Ambulatory Visit: Payer: Medicaid Other | Attending: Neurosurgery

## 2022-08-25 DIAGNOSIS — M542 Cervicalgia: Secondary | ICD-10-CM | POA: Insufficient documentation

## 2022-08-25 DIAGNOSIS — M6281 Muscle weakness (generalized): Secondary | ICD-10-CM | POA: Insufficient documentation

## 2022-08-25 DIAGNOSIS — R293 Abnormal posture: Secondary | ICD-10-CM | POA: Diagnosis not present

## 2022-08-25 DIAGNOSIS — R252 Cramp and spasm: Secondary | ICD-10-CM | POA: Insufficient documentation

## 2022-08-25 DIAGNOSIS — I480 Paroxysmal atrial fibrillation: Secondary | ICD-10-CM | POA: Diagnosis not present

## 2022-08-25 LAB — PSA, TOTAL AND FREE
PSA, Free Pct: 20 %
PSA, Free: 0.2 ng/mL
Prostate Specific Ag, Serum: 1 ng/mL (ref 0.0–4.0)

## 2022-08-25 LAB — LP+NON-HDL CHOLESTEROL
Cholesterol, Total: 156 mg/dL (ref 100–199)
HDL: 29 mg/dL — ABNORMAL LOW (ref >39)
LDL Chol Calc (NIH): 100 mg/dL — ABNORMAL HIGH (ref 0–99)
Total Non-HDL-Chol (LDL+VLDL): 127 mg/dL (ref 0–129)
Triglycerides: 149 mg/dL (ref 0–149)
VLDL Cholesterol Cal: 27 mg/dL (ref 5–40)

## 2022-08-25 LAB — MICROALBUMIN / CREATININE URINE RATIO
Creatinine, Urine: 254.7 mg/dL
Microalb/Creat Ratio: 12 mg/g creat (ref 0–29)
Microalbumin, Urine: 31.8 ug/mL

## 2022-08-25 NOTE — Therapy (Signed)
OUTPATIENT PHYSICAL THERAPY CERVICAL EVALUATION   Patient Name: Jason Stokes MRN: [id number] DOB:July 15, 1961, 62 y.o., male Today's Date: 08/25/2022  END OF SESSION:  PT End of Session - 08/25/22 1338     Visit Number 1    Number of Visits 13    Date for PT Re-Evaluation 10/14/22    Authorization Type MEDICAID [place] ACCESS    PT Start Time 1333    PT Stop Time 1417    PT Time Calculation (min) 44 min    Activity Tolerance Patient tolerated treatment well    Behavior During Therapy WFL for tasks assessed/performed             Past Medical History:  Diagnosis Date   Anxiety    Bilateral carpal tunnel syndrome 01/10/2018   Bipolar disorder ([place])    Chronic lower back pain    Depression    Dysrhythmia    a-fib   GERD (gastroesophageal reflux disease)    History of alcohol abuse    History of nuclear stress test    Myoview 10/16: EF 50%, diaphragmatic attenuation, no ischemia, low risk   Hypertension    Migraine 2012-2014   Moderate persistent asthma with acute exacerbation 05/02/2018   PAF (paroxysmal atrial fibrillation) ([place]) 03/27/2015   a. Myoview neg for ischemia >> Flecainide started 10/16 >> FU ETT    Schizophrenia ([place])    Sciatica neuralgia    Small vessel disease ([place])    Right basal ganglia stroke   Stroke [facility]) "between 2012-2014"   residual "AF" (09/22/2015)   Past Surgical History:  Procedure Laterality Date   ATRIAL FIBRILLATION ABLATION  09/22/2015   ATRIAL FIBRILLATION ABLATION N/A 04/28/2022   Procedure: ATRIAL FIBRILLATION ABLATION;  Surgeon: Constance Haw, MD;  Location: [place] [facility];  Service: Cardiovascular;  Laterality: N/A;   CYST EXCISION  1996-97   surgery back of head    ELECTROPHYSIOLOGIC STUDY N/A 09/22/2015   Procedure: Atrial Fibrillation Ablation;  Surgeon: Will [name], MD;  Location: [place] [facility];  Service: Cardiovascular;  Laterality: N/A;   ELECTROPHYSIOLOGIC STUDY N/A 12/10/2015   Procedure: Atrial  Fibrillation Ablation;  Surgeon: Will [name], MD;  Location: [place] [facility];  Service: Cardiovascular;  Laterality: N/A;   ELECTROPHYSIOLOGIC STUDY N/A 12/11/2015   Procedure: Cardioversion;  Surgeon: Will [name], MD;  Location: [place] [facility];  Service: Cardiovascular;  Laterality: N/A;   EMBOLIZATION Right 08/19/2020   Procedure: EMBOLIZATION;  Surgeon: Cherre Robins, MD;  Location:  CV LAB;  Service: Cardiovascular;  Laterality: Right;  hypogastric   EXCISION MASS HEAD N/A 01/06/2017   Procedure: EXCISION MASS FOREHEAD;  Surgeon: Irene Limbo, MD;  Location: [place];  Service: Plastics;  Laterality: N/A;   EXCISION MASS UPPER EXTREMETIES Right 08/08/2022   Procedure: EXCISION MASS RIGHT FOREARM;  Surgeon: Leanora Cover, MD;  Location: [place];  Service: Orthopedics;  Laterality: Right;  45 MIN   GANGLION CYST EXCISION Left    INTERCOSTAL NERVE BLOCK  2005   KNEE ARTHROSCOPY Right 2016   MASS EXCISION N/A 01/25/2021   Procedure: EXCISION SUBCUTANEOUS VS SUBFASCIAL MASS TORSO 3CM;  Surgeon: Irene Limbo, MD;  Location: [place];  Service: Plastics;  Laterality: N/A;   Patient Active Problem List   Diagnosis Date Noted   Aortic atherosclerosis ([place]) 06/07/2022   Hypercoagulable state due to paroxysmal atrial fibrillation ([place]) 03/23/2021   Type 2 diabetes mellitus with hyperglycemia, without long-term  current use of insulin ([place]) 02/10/2021   Neck pain on left side 03/06/2019   Musculoskeletal chest pain 07/24/2018   Asthma, mild intermittent 05/02/2018   Allergic rhinitis caused by mold 05/02/2018   Tobacco use 05/02/2018   Bilateral carpal tunnel syndrome 01/10/2018   Stroke ([place])    Schizophrenia ([place])    Migraine    History of nuclear stress test    History of alcohol abuse    GERD (gastroesophageal reflux disease)    Dysrhythmia    Chronic lower back pain    Anxiety    Arthritis of  knee, right 04/12/2017   Bipolar disorder ([place]) 04/03/2017   Lipoma of forehead 09/22/2016   Trigger ring finger of right hand 06/22/2016   Paroxysmal atrial fibrillation ([facility])    AF (atrial fibrillation) ([place]) 12/10/2015   Eczema 07/10/2015   History of CVA (cerebrovascular accident) 05/04/2015   Tear of medial meniscus of right knee 03/18/2015   Chronic pain of right knee 03/12/2015   Other and unspecified hyperlipidemia 11/25/2013   Nasal congestion 11/25/2013   Sinusitis, chronic 05/09/2013   Chronic low back pain 10/12/2012   Lumbar radiculopathy 10/12/2012   Hypertension 10/12/2012   Depression 10/12/2012   Insomnia 10/12/2012    PCP: Charlott Rakes, MD  REFERRING PROVIDER: Ashok Pall, MD  REFERRING DIAG: [phone] (ICD-10-CM) - Other spondylosis with radiculopathy, cervical region   THERAPY DIAG:  Cervicalgia  Muscle weakness (generalized)  Abnormal posture  Cramp and spasm  Rationale for Evaluation and Treatment: Rehabilitation  ONSET DATE: Chronic with   SUBJECTIVE:                                                                                                                                                                                                         SUBJECTIVE STATEMENT: Pt reports he woke up and turned his head on 07/16/22 and he heard a click in his neck. 9-10/10 neck pain developed and he was not able to lift his hands above his shoulders. Pt reports having a CT scan and it showed a bulging disc. Pt reports yesterday he received a cortisone injection to his L neck. Pt notes he has not noticed a change in the pain level as of yet.  PERTINENT HISTORY:  Migraines, bipolar disorder, hx of bilat carpal tunnel, PAF  PAIN:  Are you having pain? Yes: NPRS scale: 8/10 Pain location: neck pain R>L Pain description: Throb, ache Aggravating factors: Turning my head  Relieving factors: Not sure, heat and cold packs don't seem to help  PRECAUTIONS:  None  WEIGHT BEARING RESTRICTIONS: No  FALLS:  Has patient fallen in last 6 months? No  LIVING ENVIRONMENT: Lives with: lives with their family Lives in: House/apartment Pt reports no issue c accessing or mobility within home  OCCUPATION: On disability  PLOF: Independent  PATIENT GOALS: Less pain and better function with turning head, especially with driving  NEXT MD VISIT: After the completion of PT  OBJECTIVE:   DIAGNOSTIC FINDINGS:    CT scan 07/27/22 IMPRESSION: 1. No acute fracture or traumatic listhesis of the cervical spine. 2. 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  PATIENT SURVEYS:  NDI=41, 82% complete disability  COGNITION: Overall cognitive status: Within functional limits for tasks assessed  SENSATION: WFL  POSTURE: rounded shoulders and forward head  PALPATION: TTP of the cervical paraspinals and upper trap with increased muscle tension, L>R  CERVICAL ROM:   Active ROM A/PROM (deg) eval  Flexion 35 pulling posterior bilat  Extension 40 pulling R lateral  Right lateral flexion 15 pulling bilat  Left lateral flexion 15 " needs to pop"  Right rotation 30 puling L lateral  Left rotation 40 pulling R lateral   (Blank rows = not tested)  UPPER EXTREMITY ROM: Passively able to lift arms to 110d bilat before report of pain as limitation Active ROM Right eval Left eval  Shoulder flexion 90 90  Shoulder extension    Shoulder abduction    Shoulder adduction    Shoulder extension    Shoulder internal rotation    Shoulder external rotation    Elbow flexion    Elbow extension    Wrist flexion    Wrist extension    Wrist ulnar deviation    Wrist radial deviation    Wrist pronation    Wrist supination     (Blank rows = not tested)  UPPER EXTREMITY MMT: Grossly 4+ to 5/5 and equal bilat. Neck weakness noted per poor posture MMT Right eval  Left eval  Shoulder flexion    Shoulder extension    Shoulder abduction    Shoulder adduction    Shoulder extension    Shoulder internal rotation    Shoulder external rotation    Middle trapezius    Lower trapezius    Elbow flexion    Elbow extension    Wrist flexion    Wrist extension    Wrist ulnar deviation    Wrist radial deviation    Wrist pronation    Wrist supination    Grip strength     (Blank rows = not tested)  CERVICAL SPECIAL TESTS:  Spurling's test positive; Distraction test positive  FUNCTIONAL TESTS:  NT  TODAY'S TREATMENT:                                                                                                                               [facility] Adult PT Treatment:  DATE: 08/25/22 Therapeutic Exercise: Developed, instructed in, and pt completed therex as noted in HEP   PATIENT EDUCATION:  Education details: Eval findings, POC, HEP, self care  Person educated: Patient Education method: Explanation, Demonstration, Tactile cues, Verbal cues, and Handouts Education comprehension: verbalized understanding, returned demonstration, verbal cues required, and tactile cues required  HOME EXERCISE PROGRAM: Access Code: [email or web address] URL: [email or web address] Date: 08/25/2022 Prepared by: Gar Ponto  Exercises - Seated Cervical Retraction  - 6 x daily - 7 x weekly - 1 sets - 3-5 reps - 3 hold - Seated Scapular Retraction  - 6 x daily - 7 x weekly - 1 sets - 3-5 reps - 3 hold - Seated Cervical Rotation AROM  - 6 x daily - 7 x weekly - 3 sets - 3 reps - 15 hold  ASSESSMENT:  CLINICAL IMPRESSION: Patient is a 62 y.o. male who was seen today for physical therapy evaluation and treatment for M47.22 (ICD-10-CM) - Other spondylosis with radiculopathy, cervical region. Pt presents to PT signsnad symptoms consistent with degenerative changes of the cervical spine as noted in the pt's MRI. Pt will benefit  from skilled PT to address impairments for improved neck function   OBJECTIVE IMPAIRMENTS: decreased activity tolerance, decreased ROM, decreased strength, increased muscle spasms, impaired flexibility, impaired UE functional use, postural dysfunction, obesity, and pain.   ACTIVITY LIMITATIONS: carrying, lifting, bending, sitting, sleeping, dressing, reach over head, and caring for others  PARTICIPATION LIMITATIONS: meal prep, cleaning, laundry, and driving  PERSONAL FACTORS: Age, Fitness, Past/current experiences, Time since onset of injury/illness/exacerbation, and 1 comorbidity: high BMI  are also affecting patient's functional outcome.   REHAB POTENTIAL: Good  CLINICAL DECISION MAKING: Evolving/moderate complexity  EVALUATION COMPLEXITY: Moderate   GOALS:   SHORT TERM GOALS: Target date: 09/16/22  Pt will be Ind in an initial HEP Baseline: initial Goal status: INITIAL  2.  Pt will voice understanding of measures to assist in pain reduction Baseline: initial Goal status: INITIAL  LONG TERM GOALS: Target date: 10/14/22  Pt will be Ind in a final HEP to maintain achieved LOF  Baseline: initiated Goal status: INITIAL  2.  Pt will be able to demonstrate proper sitting posture Baseline: Poor posture Goal status: INITIAL  3.  Increase cervical side bending and and rotation for improved neck function, especially with driving Baseline: see flow sheets Goal status: INITIAL  4.  Pt will demonstrate DNF endurance of 20" with a single attempt Baseline: NT Goal status: INITIAL  5.  Pt will report neck pain of 4/10 or less for improved neck function and QOL Baseline: 8/10 Goal status: INITIAL   PLAN:  PT FREQUENCY: 2x/week  PT DURATION: 8 weeks  PLANNED INTERVENTIONS: Therapeutic exercises, Therapeutic activity, Patient/Family education, Self Care, Dry Needling, Electrical stimulation, Spinal manipulation, Spinal mobilization, Cryotherapy, Moist heat, Taping, Traction,  Ultrasound, Ionotophoresis 80m/ml Dexamethasone, Manual therapy, and Re-evaluation  PLAN FOR NEXT SESSION: Review NDI; assess response to HEP; progress therex as indicated; use of modalities, manual therapy; and TPDN as indicated.  Maeva Dant MS, PT 08/26/22 6:16 AM   Check all possible CPT codes: [id number]- PT Re-evaluation, 97110- Therapeutic Exercise, 97140 - Manual Therapy, 97530 - Therapeutic Activities, [id number]- Self Care, and [phone]- Electrical stimulation (Manual)    Check all conditions that are expected to impact treatment: Morbid obesity and Musculoskeletal disorders   If treatment provided at initial evaluation, no treatment charged due to lack of authorization.

## 2022-08-26 ENCOUNTER — Telehealth: Payer: Self-pay

## 2022-08-26 ENCOUNTER — Other Ambulatory Visit: Payer: Self-pay

## 2022-08-26 ENCOUNTER — Telehealth: Payer: Self-pay | Admitting: *Deleted

## 2022-08-26 ENCOUNTER — Encounter: Payer: Self-pay | Admitting: Surgery

## 2022-08-26 ENCOUNTER — Ambulatory Visit (INDEPENDENT_AMBULATORY_CARE_PROVIDER_SITE_OTHER): Payer: Medicaid Other | Admitting: Surgery

## 2022-08-26 VITALS — BP 126/83 | HR 77 | Temp 97.9°F | Ht 73.0 in | Wt 241.0 lb

## 2022-08-26 DIAGNOSIS — L0591 Pilonidal cyst without abscess: Secondary | ICD-10-CM | POA: Diagnosis not present

## 2022-08-26 DIAGNOSIS — I480 Paroxysmal atrial fibrillation: Secondary | ICD-10-CM | POA: Diagnosis not present

## 2022-08-26 NOTE — Patient Instructions (Signed)
Our surgery scheduler will call you Monday to confirm your surgery date. Please have the [facility] surgery sheet available when speaking with her.    Pilonidal Cyst  A pilonidal cyst is a fluid-filled sac that forms under the skin near the tailbone, at the top of the crease of the buttocks (pilonidal area). Cysts that are small and not infected may not cause any problems. Cysts that become irritated or infected may grow and fill with pus. An infected cyst is called an abscess. A pilonidal abscess may cause pain and swelling. It may need to be drained or removed. What are the causes? The cause of this condition is not always known. In some cases, it may be caused by a hair that grows into your skin (ingrown hair). What increases the risk? You are more likely to develop this condition if: You are male. You have lots of hair near the crease of the buttocks. You are overweight. You have a dimple near the crease of the buttocks. You wear tight clothing. You do not bathe or shower often. You sit for long periods of time. What are the signs or symptoms? Symptoms of this condition may include pain, swelling, redness, and warmth in the pilonidal area. You may also be able to feel a lump near your tailbone if your cyst is big. If your cyst becomes infected, symptoms may include: Pus or fluid drainage. Fever. Pain, swelling, and redness getting worse. The lump getting bigger. How is this diagnosed? This condition may be diagnosed based on: Your symptoms and medical history. A physical exam. A blood test to check for infection. A test of a pus sample. How is this treated? You may not need any treatment if your cyst does not cause symptoms. If your cyst bothers you or is infected, you may need a procedure to drain or remove the cyst. Depending on the size, location, and severity of your cyst, your health care provider may: Make an incision in the cyst and drain it (incision anddrainage). Open and  drain the cyst, and then stitch the wound so that it stays open while it heals (marsupialization). You will be given instructions about how to care for your open wound while it heals. Remove all or part of the cyst, and then close the wound (cyst removal). You may need to take antibiotics before your procedure. Follow these instructions at home: Medicines Take over-the-counter and prescription medicines only as told by your health care provider. If you were prescribed antibiotics, take them as told by your health care provider. Do not stop taking the antibiotic even if you start to feel better. General instructions Keep the area around the cyst clean and dry. If there is fluid or pus draining from your cyst: Cover the area with a clean bandage (dressing). Wash the area gently with soap and water. Pat the area dry with a clean towel. Do not rub the area because that may cause bleeding. Remove hair from the area around the cyst only if your health care provider tells you to. Do not wear tight pants or sit in one position for long periods at a time. Contact a health care provider if: You have new redness, swelling, or pain. You have a fever. You have severe pain. Summary A pilonidal cyst is a fluid-filled sac that forms under the skin near the tailbone, at the top of the crease of the buttocks (pilonidal area). Cysts that become irritated or infected may grow and fill with pus. An infected  cyst is called an abscess. The cause of this condition is not always known. In some cases, it may be caused by a hair that grows into your skin (ingrown hair). You may not need any treatment if your cyst does not cause symptoms. If your cyst bothers you or is infected, you may need a procedure to drain or remove the cyst. This information is not intended to replace advice given to you by your health care provider. Make sure you discuss any questions you have with your health care provider. Document Revised:  09/29/2021 Document Reviewed: 09/29/2021 Elsevier Patient Education  [place].

## 2022-08-26 NOTE — Telephone Encounter (Signed)
   Pre-operative Risk Assessment    Patient Name: Jason Stokes  DOB: [birth year] MRN: [id number]      Request for Surgical Clearance    Procedure:   EXCISION OF PILONIDAL CYST  Date of Surgery:  Clearance 09/13/22                                 Surgeon:  DR. Maurene Capes Surgeon's Group or Practice Name:  [facility] Phone number:  [phone] Fax number:  [phone]   Type of Clearance Requested:   - Medical  - Pharmacy:  Hold Apixaban (Eliquis)     Type of Anesthesia:  Not Indicated   Additional requests/questions:    Jiles Prows   08/26/2022, 3:50 PM

## 2022-08-26 NOTE — Telephone Encounter (Signed)
Pharmacy please advise on holding Eliquis prior to pilonidal cyst excision scheduled for 09/13/2022. Thank you.

## 2022-08-26 NOTE — Telephone Encounter (Signed)
Medical Clearance faxed to Dr.Enobong Newlin.  Cardiology Cle[facility] faxed to [facility].

## 2022-08-26 NOTE — Telephone Encounter (Signed)
Patient with diagnosis of afib on Eliquis for anticoagulation.    Procedure: pilonidal cyst excision  Date of procedure: 09/13/22  CHA2DS2-VASc Score = 5  This indicates a 7.2% annual risk of stroke. The patient's score is based upon: CHF History: 0 HTN History: 1 Diabetes History: 1 Stroke History: 2 Vascular Disease History: 1 Age Score: 0 Gender Score: 0   Underwent afib ablation 04/28/22.  CrCl 60m/min using adjusted body weight Platelet count 168K  Per office protocol, patient can hold Eliquis for 1 day prior to procedure.    **This guidance is not considered finalized until pre-operative APP has relayed final recommendations.**

## 2022-08-26 NOTE — Telephone Encounter (Signed)
   Patient Name: Jason Stokes  DOB: 08-18-1960 MRN: [id number]  Primary Cardiologist: Will [name], MD  Chart reviewed as part of pre-operative protocol coverage. Given past medical history and time since last visit, based on ACC/AHA guidelines, Jason Stokes would be at acceptable risk for the planned procedure without further cardiovascular testing.   Clinical pharmacists have reviewed the patient's past medical history, labs, and current medications as part of preoperative protocol coverage. The following recommendations have been made:  Per office protocol, patient can hold Eliquis for 1 day prior to procedure.   I will route this recommendation to the requesting party via Epic fax function and remove from pre-op pool.  Please call with questions.  Mable Fill, Marissa Nestle, NP 08/26/2022, 4:36 PM

## 2022-08-27 DIAGNOSIS — I480 Paroxysmal atrial fibrillation: Secondary | ICD-10-CM | POA: Diagnosis not present

## 2022-08-28 ENCOUNTER — Encounter: Payer: Self-pay | Admitting: Surgery

## 2022-08-28 DIAGNOSIS — I480 Paroxysmal atrial fibrillation: Secondary | ICD-10-CM | POA: Diagnosis not present

## 2022-08-28 NOTE — H&P (View-Only) (Signed)
08/26/2022  Reason for Visit:  Recurrent buttocks abscess  Requesting Provider:  Charlott Rakes, MD  History of Present Illness: Jason Stokes is a 62 y.o. male presenting for evaluation of a recurrent abscess in his left upper buttocks.  The patient reports that he has has this for a long time and notes that it recurs often, with initial swelling and tenderness followed by drainage/burst and improvement.  He has been to urgent care before for this.  Most recently, he saw his PCP on 08/16/22 and was given a course of Doxycycline for 7 days.  Today, he reports the area is doing better and denies significant pain or purulent drainage.  Denies any fever, chills, chest pain, shortness of breath.  He has a history of atrial fibrillation and is on Eliquis  Past Medical History: Past Medical History:  Diagnosis Date   Anxiety    Bilateral carpal tunnel syndrome 01/10/2018   Bipolar disorder (HCC)    Chronic lower back pain    Depression    Dysrhythmia    a-fib   GERD (gastroesophageal reflux disease)    History of alcohol abuse    History of nuclear stress test    Myoview 10/16: EF 50%, diaphragmatic attenuation, no ischemia, low risk   Hypertension    Migraine 2012-2014   Moderate persistent asthma with acute exacerbation 05/02/2018   PAF (paroxysmal atrial fibrillation) ([place]) 03/27/2015   a. Myoview neg for ischemia >> Flecainide started 10/16 >> FU ETT    Schizophrenia ([place])    Sciatica neuralgia    Small vessel disease (HCC)    Right basal ganglia stroke   Stroke [facility]) "between 2012-2014"   residual "AF" (09/22/2015)     Past Surgical History: Past Surgical History:  Procedure Laterality Date   ATRIAL FIBRILLATION ABLATION  09/22/2015   ATRIAL FIBRILLATION ABLATION N/A 04/28/2022   Procedure: ATRIAL FIBRILLATION ABLATION;  Surgeon: Constance Haw, MD;  Location: [place] [facility];  Service: Cardiovascular;  Laterality: N/A;   CYST EXCISION  1996-97   surgery back of head     ELECTROPHYSIOLOGIC STUDY N/A 09/22/2015   Procedure: Atrial Fibrillation Ablation;  Surgeon: Will [name], MD;  Location: [place] [facility];  Service: Cardiovascular;  Laterality: N/A;   ELECTROPHYSIOLOGIC STUDY N/A 12/10/2015   Procedure: Atrial Fibrillation Ablation;  Surgeon: Will [name], MD;  Location: [place] [facility];  Service: Cardiovascular;  Laterality: N/A;   ELECTROPHYSIOLOGIC STUDY N/A 12/11/2015   Procedure: Cardioversion;  Surgeon: Will [name], MD;  Location: [place] [facility];  Service: Cardiovascular;  Laterality: N/A;   EMBOLIZATION Right 08/19/2020   Procedure: EMBOLIZATION;  Surgeon: Cherre Robins, MD;  Location: [place] [facility];  Service: Cardiovascular;  Laterality: Right;  hypogastric   EXCISION MASS HEAD N/A 01/06/2017   Procedure: EXCISION MASS FOREHEAD;  Surgeon: Irene Limbo, MD;  Location: [place];  Service: Plastics;  Laterality: N/A;   EXCISION MASS UPPER EXTREMETIES Right 08/08/2022   Procedure: EXCISION MASS RIGHT FOREARM;  Surgeon: Leanora Cover, MD;  Location: [place];  Service: Orthopedics;  Laterality: Right;  45 MIN   GANGLION CYST EXCISION Left    INTERCOSTAL NERVE BLOCK  2005   KNEE ARTHROSCOPY Right 2016   MASS EXCISION N/A 01/25/2021   Procedure: EXCISION SUBCUTANEOUS VS SUBFASCIAL MASS TORSO 3CM;  Surgeon: Irene Limbo, MD;  Location: [place];  Service: Plastics;  Laterality: N/A;    Home Medications: Prior to Admission medications  Medication Sig Start Date End Date Taking? Authorizing Provider  albuterol (VENTOLIN HFA) 108 (90 Base) MCG/ACT inhaler Inhale 2 puffs into the lungs every 4 (four) hours as needed for wheezing or shortness of breath. 05/31/22  Yes Kozlow, Donnamarie Poag, MD  apixaban (ELIQUIS) 5 MG TABS tablet Take 1 tablet (5 mg total) by mouth 2 (two) times daily. 06/27/22  Yes Camnitz, Will Hassell Done, MD  atorvastatin (LIPITOR) 40 MG tablet Take 1 tablet (40  mg total) by mouth daily. 08/16/22  Yes Charlott Rakes, MD  azaTHIOprine (IMURAN) 50 MG tablet Take 100 mg by mouth daily.   Yes [provider]  azelastine (ASTELIN) 0.1 % nasal spray 1 spray each nostril twice a day 05/31/22  Yes Kozlow, Donnamarie Poag, MD  brimonidine (ALPHAGAN) 0.2 % ophthalmic solution Place 1 drop into both eyes every 8 (eight) hours.   Yes [provider]  carvedilol (COREG) 25 MG tablet Take 1 tablet (25 mg total) by mouth 2 (two) times daily. 06/27/22  Yes Camnitz, Ocie Doyne, MD  cetirizine (ZYRTEC) 10 MG tablet Take 1 tablet (10 mg total) by mouth 2 (two) times daily as needed for allergies (Can take an extra dose during flare ups.). 11/23/21  Yes Kozlow, Donnamarie Poag, MD  diclofenac Sodium (VOLTAREN) 1 % GEL Apply 1 Application topically 4 (four) times daily as needed (pain).   Yes [provider]  Difluprednate 0.05 % EMUL Place 1 drop into both eyes 2 (two) times daily.   Yes [provider]  diltiazem (CARDIZEM CD) 180 MG 24 hr capsule TAKE 1 CAPSULE BY MOUTH EVERY DAY 06/27/22  Yes Camnitz, Will Hassell Done, MD  dorzolamide-timolol (COSOPT) 22.3-6.8 MG/ML ophthalmic solution Place 1 drop into both eyes 2 (two) times daily.   Yes [provider]  doxycycline (VIBRA-TABS) 100 MG tablet Take 1 tablet (100 mg total) by mouth 2 (two) times daily. 08/16/22  Yes Charlott Rakes, MD  DULoxetine (CYMBALTA) 60 MG capsule Take 1 capsule (60 mg total) by mouth daily. For chronic pain and neuropathy 08/16/22  Yes Newlin, Enobong, MD  EPINEPHrine 0.3 mg/0.3 mL IJ SOAJ injection Inject 0.3 mg into the muscle as needed for anaphylaxis. 02/10/22  Yes Kozlow, Donnamarie Poag, MD  fluticasone Asencion Islam) 50 MCG/ACT nasal spray 1 spray each nostril twice a day 05/31/22  Yes Kozlow, Donnamarie Poag, MD  fluticasone furoate-vilanterol (BREO ELLIPTA) 200-25 MCG/ACT AEPB Inhale 1 puff into the lungs daily. 05/31/22  Yes Kozlow, Donnamarie Poag, MD  folic acid (FOLVITE) 1 MG tablet Take 1 mg by mouth  daily. 10/16/20  Yes [provider]  GARLIC PO Take 1 tablet by mouth daily.   Yes [provider]  HYDROcodone-acetaminophen (NORCO/VICODIN) 5-325 MG tablet 1-2 tabs PO q6 hours prn pain 08/08/22  Yes Leanora Cover, MD  latanoprost (XALATAN) 0.005 % ophthalmic solution 1 drop daily. 07/04/22  Yes [provider]  metFORMIN (GLUCOPHAGE) 500 MG tablet Take 1 tablet (500 mg total) by mouth 2 (two) times daily with a meal. For 1 week then increase to 2 tablets twice a day 08/16/22  Yes Newlin, Enobong, MD  methocarbamol (ROBAXIN) 500 MG tablet TAKE 1 TABLET BY MOUTH EVERY 8 HOURS AS NEEDED FOR MUSCLE SPASM 08/03/22  Yes Magnant, Gerrianne Scale, PA-C  Misc. Devices MISC Back brace. Diagnosis chronic back pain 06/16/20  Yes Charlott Rakes, MD  Misc. Devices MISC Left knee brace. Diagnosis L knee pain 06/16/20  Yes Newlin, Enobong, MD  montelukast (SINGULAIR) 10 MG tablet Take 1 tablet (  10 mg total) by mouth at bedtime. 05/31/22  Yes Kozlow, Donnamarie Poag, MD  Olopatadine HCl (PATADAY) 0.2 % SOLN Place 1 drop into both eyes daily as needed. 11/23/21  Yes Kozlow, Donnamarie Poag, MD  omeprazole (PRILOSEC) 20 MG capsule TAKE 1 CAPSULE BY MOUTH EVERY DAY 06/23/22  Yes Charlott Rakes, MD  Vitamin D3 (VITAMIN D) 25 MCG tablet Take 1,000 Units by mouth daily.   Yes [provider]    Allergies: Allergies  Allergen Reactions   Lisinopril Anaphylaxis    Swelling of lips and tongue.   Tylenol [Acetaminophen] Other (See Comments)    Acid reflux     Social History:  reports that he has been smoking cigarettes. He has never used smokeless tobacco. He reports that he does not currently use alcohol after a past usage of about 6.0 standard drinks of alcohol per week. He reports current drug use. Drug: Marijuana.   Family History: Family History  Problem Relation Age of Onset   Heart disease Father    Schizophrenia Sister     Review of Systems: Review of Systems  Constitutional:  Negative for  chills and fever.  HENT:  Negative for hearing loss.   Respiratory:  Negative for shortness of breath.   Cardiovascular:  Negative for chest pain.  Gastrointestinal:  Negative for abdominal pain, nausea and vomiting.  Genitourinary:  Negative for dysuria.  Musculoskeletal:  Negative for myalgias.  Skin:        Recurrent abscess in upper left buttocks.  Neurological:  Negative for dizziness.  Psychiatric/Behavioral:  Negative for depression.     Physical Exam BP 126/83   Pulse 77   Temp 97.9 F (36.6 C) (Oral)   Ht '6\' 1"'$  (1.854 m)   Wt 241 lb (109.3 kg)   SpO2 97%   BMI 31.80 kg/m  CONSTITUTIONAL: No acute distress, well nourished. HEENT:  Normocephalic, atraumatic, extraocular motion intact. NECK: Trachea is midline, and there is no jugular venous distension.  RESPIRATORY:  Lungs are clear, and breath sounds are equal bilaterally. Normal respiratory effort without pathologic use of accessory muscles. CARDIOVASCULAR: Heart is regular without murmurs, gallops, or rubs. MUSCULOSKELETAL:  Normal muscle strength and tone in all four extremities.  No peripheral edema or cyanosis. SKIN: The patient has a 2 cm area of induration in the medial upper left buttocks consistent with a previously infected area.  No active drainage and no open wound.  However, on further inspection, the patient does have a pilonidal sinus in the upper gluteal cleft.  When pushing on the abscess site, there is serous fluid that can be expressed from the pilonidal sinus.  NEUROLOGIC:  Motor and sensation is grossly normal.  Cranial nerves are grossly intact. PSYCH:  Alert and oriented to person, place and time. Affect is normal.  Laboratory Analysis: Labs from 06/23/22: Na 140, K 3.5, Cl 110, CO2 22, BUN <5, Cr 0.97.  Total bili 0.4, AST 16, ALT 13, Alk Phos 110, albumin 3.2.    Labs from 08/16/22: HgA1c 8.1  Imaging: No results found.  Assessment and Plan: This is a 62 y.o. male with an infected pilonidal  cyst.  --Discussed with the patient he actually has a pilonidal cyst that has been getting infected, not a simple cyst of the skin.  Discussed what a pilonidal cyst is and how it can occur and how it's treated.  Discussed with him that for now that the area still has some inflammation, would wait for it to come down  as the area heals and then we would proceed with excision.  Discussed the need for excising not just the area that has been getting infected but also the sinus so this would not keep recurring.  He is in agreement. --Discussed with him the plan for excision of pilonidal cyst and reviewed the surgery at length with him including the planned incision, risks of bleeding, infection, injury to surrounding structures, that this would be an outpatient surgery, post-op recovery and suture removal, pain control, and he is willing to proceed. --Will send for cardiology clearance and medical clearance. --Will tentatively schedule him for surgery on 09/13/22.  Last dose of Eliquis would be on 09/10/22.   --All of his questions have been answered.  I spent 55 minutes dedicated to the care of this patient on the date of this encounter to include pre-visit review of records, face-to-face time with the patient discussing diagnosis and management, and any post-visit coordination of care.   Melvyn Neth, [facility]

## 2022-08-28 NOTE — Progress Notes (Signed)
08/26/2022  Reason for Visit:  Recurrent buttocks abscess  Requesting Provider:  Charlott Rakes, MD  History of Present Illness: Jason Stokes is a 62 y.o. male presenting for evaluation of a recurrent abscess in his left upper buttocks.  The patient reports that he has has this for a long time and notes that it recurs often, with initial swelling and tenderness followed by drainage/burst and improvement.  He has been to urgent care before for this.  Most recently, he saw his PCP on 08/16/22 and was given a course of Doxycycline for 7 days.  Today, he reports the area is doing better and denies significant pain or purulent drainage.  Denies any fever, chills, chest pain, shortness of breath.  He has a history of atrial fibrillation and is on Eliquis  Past Medical History: Past Medical History:  Diagnosis Date   Anxiety    Bilateral carpal tunnel syndrome 01/10/2018   Bipolar disorder (HCC)    Chronic lower back pain    Depression    Dysrhythmia    a-fib   GERD (gastroesophageal reflux disease)    History of alcohol abuse    History of nuclear stress test    Myoview 10/16: EF 50%, diaphragmatic attenuation, no ischemia, low risk   Hypertension    Migraine 2012-2014   Moderate persistent asthma with acute exacerbation 05/02/2018   PAF (paroxysmal atrial fibrillation) ([place]) 03/27/2015   a. Myoview neg for ischemia >> Flecainide started 10/16 >> FU ETT    Schizophrenia ([name])    Sciatica neuralgia    Small vessel disease (HCC)    Right basal ganglia stroke   Stroke [facility]) "between 2012-2014"   residual "AF" (09/22/2015)     Past Surgical History: Past Surgical History:  Procedure Laterality Date   ATRIAL FIBRILLATION ABLATION  09/22/2015   ATRIAL FIBRILLATION ABLATION N/A 04/28/2022   Procedure: ATRIAL FIBRILLATION ABLATION;  Surgeon: Constance Haw, MD;  Location: [place] [facility];  Service: Cardiovascular;  Laterality: N/A;   CYST EXCISION  1996-97   surgery back of head     ELECTROPHYSIOLOGIC STUDY N/A 09/22/2015   Procedure: Atrial Fibrillation Ablation;  Surgeon: Will [name], MD;  Location: [place] [facility];  Service: Cardiovascular;  Laterality: N/A;   ELECTROPHYSIOLOGIC STUDY N/A 12/10/2015   Procedure: Atrial Fibrillation Ablation;  Surgeon: Will [name], MD;  Location: [place] [facility];  Service: Cardiovascular;  Laterality: N/A;   ELECTROPHYSIOLOGIC STUDY N/A 12/11/2015   Procedure: Cardioversion;  Surgeon: Will [name], MD;  Location: [place] [facility];  Service: Cardiovascular;  Laterality: N/A;   EMBOLIZATION Right 08/19/2020   Procedure: EMBOLIZATION;  Surgeon: Cherre Robins, MD;  Location: [place] [facility];  Service: Cardiovascular;  Laterality: Right;  hypogastric   EXCISION MASS HEAD N/A 01/06/2017   Procedure: EXCISION MASS FOREHEAD;  Surgeon: Irene Limbo, MD;  Location: [place];  Service: Plastics;  Laterality: N/A;   EXCISION MASS UPPER EXTREMETIES Right 08/08/2022   Procedure: EXCISION MASS RIGHT FOREARM;  Surgeon: Leanora Cover, MD;  Location: [place];  Service: Orthopedics;  Laterality: Right;  45 MIN   GANGLION CYST EXCISION Left    INTERCOSTAL NERVE BLOCK  2005   KNEE ARTHROSCOPY Right 2016   MASS EXCISION N/A 01/25/2021   Procedure: EXCISION SUBCUTANEOUS VS SUBFASCIAL MASS TORSO 3CM;  Surgeon: Irene Limbo, MD;  Location: [place];  Service: Plastics;  Laterality: N/A;    Home Medications: Prior to Admission medications  Medication Sig Start Date End Date Taking? Authorizing Provider  albuterol (VENTOLIN HFA) 108 (90 Base) MCG/ACT inhaler Inhale 2 puffs into the lungs every 4 (four) hours as needed for wheezing or shortness of breath. 05/31/22  Yes Kozlow, Donnamarie Poag, MD  apixaban (ELIQUIS) 5 MG TABS tablet Take 1 tablet (5 mg total) by mouth 2 (two) times daily. 06/27/22  Yes Camnitz, Will Hassell Done, MD  atorvastatin (LIPITOR) 40 MG tablet Take 1 tablet (40  mg total) by mouth daily. 08/16/22  Yes Charlott Rakes, MD  azaTHIOprine (IMURAN) 50 MG tablet Take 100 mg by mouth daily.   Yes [provider]  azelastine (ASTELIN) 0.1 % nasal spray 1 spray each nostril twice a day 05/31/22  Yes Kozlow, Donnamarie Poag, MD  brimonidine (ALPHAGAN) 0.2 % ophthalmic solution Place 1 drop into both eyes every 8 (eight) hours.   Yes [provider]  carvedilol (COREG) 25 MG tablet Take 1 tablet (25 mg total) by mouth 2 (two) times daily. 06/27/22  Yes Camnitz, Ocie Doyne, MD  cetirizine (ZYRTEC) 10 MG tablet Take 1 tablet (10 mg total) by mouth 2 (two) times daily as needed for allergies (Can take an extra dose during flare ups.). 11/23/21  Yes Kozlow, Donnamarie Poag, MD  diclofenac Sodium (VOLTAREN) 1 % GEL Apply 1 Application topically 4 (four) times daily as needed (pain).   Yes [provider]  Difluprednate 0.05 % EMUL Place 1 drop into both eyes 2 (two) times daily.   Yes [provider]  diltiazem (CARDIZEM CD) 180 MG 24 hr capsule TAKE 1 CAPSULE BY MOUTH EVERY DAY 06/27/22  Yes Camnitz, Will Hassell Done, MD  dorzolamide-timolol (COSOPT) 22.3-6.8 MG/ML ophthalmic solution Place 1 drop into both eyes 2 (two) times daily.   Yes [provider]  doxycycline (VIBRA-TABS) 100 MG tablet Take 1 tablet (100 mg total) by mouth 2 (two) times daily. 08/16/22  Yes Charlott Rakes, MD  DULoxetine (CYMBALTA) 60 MG capsule Take 1 capsule (60 mg total) by mouth daily. For chronic pain and neuropathy 08/16/22  Yes Newlin, Enobong, MD  EPINEPHrine 0.3 mg/0.3 mL IJ SOAJ injection Inject 0.3 mg into the muscle as needed for anaphylaxis. 02/10/22  Yes Kozlow, Donnamarie Poag, MD  fluticasone Asencion Islam) 50 MCG/ACT nasal spray 1 spray each nostril twice a day 05/31/22  Yes Kozlow, Donnamarie Poag, MD  fluticasone furoate-vilanterol (BREO ELLIPTA) 200-25 MCG/ACT AEPB Inhale 1 puff into the lungs daily. 05/31/22  Yes Kozlow, Donnamarie Poag, MD  folic acid (FOLVITE) 1 MG tablet Take 1 mg by mouth  daily. 10/16/20  Yes [provider]  GARLIC PO Take 1 tablet by mouth daily.   Yes [provider]  HYDROcodone-acetaminophen (NORCO/VICODIN) 5-325 MG tablet 1-2 tabs PO q6 hours prn pain 08/08/22  Yes Leanora Cover, MD  latanoprost (XALATAN) 0.005 % ophthalmic solution 1 drop daily. 07/04/22  Yes [provider]  metFORMIN (GLUCOPHAGE) 500 MG tablet Take 1 tablet (500 mg total) by mouth 2 (two) times daily with a meal. For 1 week then increase to 2 tablets twice a day 08/16/22  Yes Newlin, Enobong, MD  methocarbamol (ROBAXIN) 500 MG tablet TAKE 1 TABLET BY MOUTH EVERY 8 HOURS AS NEEDED FOR MUSCLE SPASM 08/03/22  Yes Magnant, Gerrianne Scale, PA-C  Misc. Devices MISC Back brace. Diagnosis chronic back pain 06/16/20  Yes Charlott Rakes, MD  Misc. Devices MISC Left knee brace. Diagnosis L knee pain 06/16/20  Yes Newlin, Enobong, MD  montelukast (SINGULAIR) 10 MG tablet Take 1 tablet (  10 mg total) by mouth at bedtime. 05/31/22  Yes Kozlow, Donnamarie Poag, MD  Olopatadine HCl (PATADAY) 0.2 % SOLN Place 1 drop into both eyes daily as needed. 11/23/21  Yes Kozlow, Donnamarie Poag, MD  omeprazole (PRILOSEC) 20 MG capsule TAKE 1 CAPSULE BY MOUTH EVERY DAY 06/23/22  Yes Charlott Rakes, MD  Vitamin D3 (VITAMIN D) 25 MCG tablet Take 1,000 Units by mouth daily.   Yes [provider]    Allergies: Allergies  Allergen Reactions   Lisinopril Anaphylaxis    Swelling of lips and tongue.   Tylenol [Acetaminophen] Other (See Comments)    Acid reflux     Social History:  reports that he has been smoking cigarettes. He has never used smokeless tobacco. He reports that he does not currently use alcohol after a past usage of about 6.0 standard drinks of alcohol per week. He reports current drug use. Drug: Marijuana.   Family History: Family History  Problem Relation Age of Onset   Heart disease Father    Schizophrenia Sister     Review of Systems: Review of Systems  Constitutional:  Negative for  chills and fever.  HENT:  Negative for hearing loss.   Respiratory:  Negative for shortness of breath.   Cardiovascular:  Negative for chest pain.  Gastrointestinal:  Negative for abdominal pain, nausea and vomiting.  Genitourinary:  Negative for dysuria.  Musculoskeletal:  Negative for myalgias.  Skin:        Recurrent abscess in upper left buttocks.  Neurological:  Negative for dizziness.  Psychiatric/Behavioral:  Negative for depression.     Physical Exam BP 126/83   Pulse 77   Temp 97.9 F (36.6 C) (Oral)   Ht 6' 1"$  (1.854 m)   Wt 241 lb (109.3 kg)   SpO2 97%   BMI 31.80 kg/m  CONSTITUTIONAL: No acute distress, well nourished. HEENT:  Normocephalic, atraumatic, extraocular motion intact. NECK: Trachea is midline, and there is no jugular venous distension.  RESPIRATORY:  Lungs are clear, and breath sounds are equal bilaterally. Normal respiratory effort without pathologic use of accessory muscles. CARDIOVASCULAR: Heart is regular without murmurs, gallops, or rubs. MUSCULOSKELETAL:  Normal muscle strength and tone in all four extremities.  No peripheral edema or cyanosis. SKIN: The patient has a 2 cm area of induration in the medial upper left buttocks consistent with a previously infected area.  No active drainage and no open wound.  However, on further inspection, the patient does have a pilonidal sinus in the upper gluteal cleft.  When pushing on the abscess site, there is serous fluid that can be expressed from the pilonidal sinus.  NEUROLOGIC:  Motor and sensation is grossly normal.  Cranial nerves are grossly intact. PSYCH:  Alert and oriented to person, place and time. Affect is normal.  Laboratory Analysis: Labs from 06/23/22: Na 140, K 3.5, Cl 110, CO2 22, BUN <5, Cr 0.97.  Total bili 0.4, AST 16, ALT 13, Alk Phos 110, albumin 3.2.    Labs from 08/16/22: HgA1c 8.1  Imaging: No results found.  Assessment and Plan: This is a 62 y.o. male with an infected pilonidal  cyst.  --Discussed with the patient he actually has a pilonidal cyst that has been getting infected, not a simple cyst of the skin.  Discussed what a pilonidal cyst is and how it can occur and how it's treated.  Discussed with him that for now that the area still has some inflammation, would wait for it to come down  as the area heals and then we would proceed with excision.  Discussed the need for excising not just the area that has been getting infected but also the sinus so this would not keep recurring.  He is in agreement. --Discussed with him the plan for excision of pilonidal cyst and reviewed the surgery at length with him including the planned incision, risks of bleeding, infection, injury to surrounding structures, that this would be an outpatient surgery, post-op recovery and suture removal, pain control, and he is willing to proceed. --Will send for cardiology clearance and medical clearance. --Will tentatively schedule him for surgery on 09/13/22.  Last dose of Eliquis would be on 09/10/22.   --All of his questions have been answered.  I spent 55 minutes dedicated to the care of this patient on the date of this encounter to include pre-visit review of records, face-to-face time with the patient discussing diagnosis and management, and any post-visit coordination of care.   Melvyn Neth, [facility]

## 2022-08-29 ENCOUNTER — Telehealth: Payer: Self-pay

## 2022-08-29 ENCOUNTER — Telehealth: Payer: Self-pay | Admitting: Surgery

## 2022-08-29 DIAGNOSIS — I480 Paroxysmal atrial fibrillation: Secondary | ICD-10-CM | POA: Diagnosis not present

## 2022-08-29 NOTE — Telephone Encounter (Signed)
Cardiac Clearance received from NP [facility] at acceptable risk for the planned procedure without further cardiovascular testing. Per office protocol can hold Eliquis 1 day prior to procedure.

## 2022-08-29 NOTE — Telephone Encounter (Signed)
Patient has been advised of Pre-Admission date/time, and Surgery date at [facility].  Surgery Date: 09/13/22 Preadmission Testing Date: 09/05/22 (phone 8a-1p)  Patient has been made aware to call [phone], between 1-3:00pm the day before surgery, to find out what time to arrive for surgery.

## 2022-08-30 DIAGNOSIS — I480 Paroxysmal atrial fibrillation: Secondary | ICD-10-CM | POA: Diagnosis not present

## 2022-08-30 NOTE — Procedures (Signed)
Cervical Epidural Steroid Injection - Interlaminar Approach with Fluoroscopic Guidance  [name]      Date of Birth: 1960/11/30 MRN: [id number] PCP: Charlott Rakes, MD      Visit Date: 08/24/2022   Universal Protocol:    Date/Time: 02/13/245:55 AM  Consent Given By: the patient  Position: PRONE  Additional Comments: Vital signs were monitored before and after the procedure. Patient was prepped and draped in the usual sterile fashion. The correct patient, procedure, and site was verified.   Injection Procedure Details:   Procedure diagnoses: Cervical radiculopathy [M54.12]    Meds Administered:  Meds ordered this encounter  Medications   methylPREDNISolone acetate (DEPO-MEDROL) injection 80 mg     Laterality: Left  Location/Site: C7-T1  Needle: 3.5 in., 20 ga. Tuohy  Needle Placement: Paramedian epidural space  Findings:  -Comments: Excellent flow of contrast into the epidural space.  Procedure Details: Using a paramedian approach from the side mentioned above, the region overlying the inferior lamina was localized under fluoroscopic visualization and the soft tissues overlying this structure were infiltrated with 4 ml. of 1% Lidocaine without Epinephrine. A # 20 gauge, Tuohy needle was inserted into the epidural space using a paramedian approach.  The epidural space was localized using loss of resistance along with contralateral oblique bi-planar fluoroscopic views.  After negative aspirate for air, blood, and CSF, a 2 ml. volume of Isovue-250 was injected into the epidural space and the flow of contrast was observed. Radiographs were obtained for documentation purposes.   The injectate was administered into the level noted above.  Additional Comments:  No complications occurred Dressing: 2 x 2 sterile gauze and Band-Aid    Post-procedure details: Patient was observed during the procedure. Post-procedure instructions were reviewed.  Patient left  the clinic in stable condition.

## 2022-08-30 NOTE — Progress Notes (Signed)
Jason Stokes - 62 y.o. male MRN [id number]  Date of birth: 08-28-1960  Office Visit Note: Visit Date: 08/24/2022 PCP: Charlott Rakes, MD Referred by: Charlott Rakes, MD  Subjective: Chief Complaint  Patient presents with   Neck - Pain   HPI:  Jason Stokes is a 62 y.o. male who comes in today at the request of Barnet Pall, FNP and Dr. Landry Dyke Dean for planned Left C7-T1 Cervical Interlaminar epidural steroid injection with fluoroscopic guidance.  The patient has failed conservative care including home exercise, medications, time and activity modification.  This injection will be diagnostic and hopefully therapeutic.  Please see requesting physician notes for further details and justification.   ROS Otherwise per HPI.  Assessment & Plan: Visit Diagnoses:    ICD-10-CM   1. Cervical radiculopathy  M54.12 XR C-ARM NO REPORT    Epidural Steroid injection    methylPREDNISolone acetate (DEPO-MEDROL) injection 80 mg      Plan: No additional findings.   Meds & Orders:  Meds ordered this encounter  Medications   methylPREDNISolone acetate (DEPO-MEDROL) injection 80 mg    Orders Placed This Encounter  Procedures   XR C-ARM NO REPORT   Epidural Steroid injection    Follow-up: Return for visit to requesting provider as needed.   Procedures: No procedures performed  Cervical Epidural Steroid Injection - Interlaminar Approach with Fluoroscopic Guidance  [name]      Date of Birth: Jan 14, 1961 MRN: [id number] PCP: Charlott Rakes, MD      Visit Date: 08/24/2022   Universal Protocol:    Date/Time: 02/13/245:55 AM  Consent Given By: the patient  Position: PRONE  Additional Comments: Vital signs were monitored before and after the procedure. Patient was prepped and draped in the usual sterile fashion. The correct patient, procedure, and site was verified.   Injection Procedure Details:   Procedure diagnoses: Cervical radiculopathy [M54.12]     Meds Administered:  Meds ordered this encounter  Medications   methylPREDNISolone acetate (DEPO-MEDROL) injection 80 mg     Laterality: Left  Location/Site: C7-T1  Needle: 3.5 in., 20 ga. Tuohy  Needle Placement: Paramedian epidural space  Findings:  -Comments: Excellent flow of contrast into the epidural space.  Procedure Details: Using a paramedian approach from the side mentioned above, the region overlying the inferior lamina was localized under fluoroscopic visualization and the soft tissues overlying this structure were infiltrated with 4 ml. of 1% Lidocaine without Epinephrine. A # 20 gauge, Tuohy needle was inserted into the epidural space using a paramedian approach.  The epidural space was localized using loss of resistance along with contralateral oblique bi-planar fluoroscopic views.  After negative aspirate for air, blood, and CSF, a 2 ml. volume of Isovue-250 was injected into the epidural space and the flow of contrast was observed. Radiographs were obtained for documentation purposes.   The injectate was administered into the level noted above.  Additional Comments:  No complications occurred Dressing: 2 x 2 sterile gauze and Band-Aid    Post-procedure details: Patient was observed during the procedure. Post-procedure instructions were reviewed.  Patient left the clinic in stable condition.   Clinical History: MRI CERVICAL SPINE WITHOUT CONTRAST   TECHNIQUE: Multiplanar, multisequence MR imaging of the cervical spine was performed. No intravenous contrast was administered.   COMPARISON:  Cervical spine CT 12/06/2020   FINDINGS: Alignment: Unremarkable   Vertebrae: No fracture, evidence of discitis, or bone lesion.   Cord: Normal signal and morphology.   Posterior  Fossa, vertebral arteries, paraspinal tissues: Suboccipital scarring. No perispinal mass or inflammation.   Disc levels:   C2-3: Asymmetric left facet spurring.  No neural  impingement   C3-4: Disc space narrowing and bulging with endplate and uncovertebral ridging. Left foraminal impingement. A disc osteophyte complex mildly indents the ventral cord   C4-5: Disc narrowing and bulging. Asymmetric left facet spurring. Moderate left and mild right foraminal narrowing   C5-6: Disc space narrowing and bulging with uncovertebral ridging. Mild facet spurring. Moderate bilateral foraminal narrowing   IMPRESSION: 1. 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 Moderate right foraminal narrowing at C5-6. 2. Diffusely patent spinal canal.     Electronically Signed   By: Jorje Guild M.D.   On: 05/18/2022 10:22     Objective:  VS:  HT:    WT:   BMI:     BP:134/78  HR:90bpm  TEMP: ( )  RESP:  Physical Exam Vitals and nursing note reviewed.  Constitutional:      General: He is not in acute distress.    Appearance: Normal appearance. He is not ill-appearing.  HENT:     Head: Normocephalic and atraumatic.     Right Ear: External ear normal.     Left Ear: External ear normal.  Eyes:     Extraocular Movements: Extraocular movements intact.  Cardiovascular:     Rate and Rhythm: Normal rate.     Pulses: Normal pulses.  Abdominal:     General: There is no distension.     Palpations: Abdomen is soft.  Musculoskeletal:        General: No signs of injury.     Cervical back: Neck supple. Tenderness present. No rigidity.     Right lower leg: No edema.     Left lower leg: No edema.     Comments: Patient has good strength in the upper extremities with 5 out of 5 strength in wrist extension long finger flexion APB.  No intrinsic hand muscle atrophy.  Negative Hoffmann's test.  Lymphadenopathy:     Cervical: No cervical adenopathy.  Skin:    Findings: No erythema or rash.  Neurological:     General: No focal deficit present.     Mental Status: He is alert and oriented to person, place, and time.     Sensory: No  sensory deficit.     Motor: No weakness or abnormal muscle tone.     Coordination: Coordination normal.  Psychiatric:        Mood and Affect: Mood normal.        Behavior: Behavior normal.      Imaging: No results found.

## 2022-08-31 ENCOUNTER — Other Ambulatory Visit: Payer: Self-pay | Admitting: Pharmacist

## 2022-08-31 ENCOUNTER — Ambulatory Visit: Payer: Self-pay

## 2022-08-31 DIAGNOSIS — E1169 Type 2 diabetes mellitus with other specified complication: Secondary | ICD-10-CM

## 2022-08-31 DIAGNOSIS — I480 Paroxysmal atrial fibrillation: Secondary | ICD-10-CM | POA: Diagnosis not present

## 2022-08-31 MED ORDER — ACCU-CHEK GUIDE VI STRP: ORAL_STRIP | 6 refills | Status: DC

## 2022-08-31 NOTE — Telephone Encounter (Signed)
  Chief Complaint: advice Symptoms: NA Frequency:  Pertinent Negatives: NA Disposition: '[]'$ ED /'[]'$ Urgent Care (no appt availability in office) / '[]'$ Appointment(In office/virtual)/ '[]'$  [place] [facility]/ '[]'$ Home Care/ '[]'$ Refused Recommended Disposition /'[]'$  Mobile Bus/ '[x]'$  Follow-up with PCP Additional Notes: pt having cyst removed on 09/13/22, pt A1C 8.1 and said has to be lower to have procedure, pt states had cortisone shot 1-2 weeks prior and hoping that cause of increase since A1C was 5.3. pt asking if he can have labs redone or what he needs to do since wanting to have cyst removed d/t pain. Advised pt medical clearance form was sent to provider and will send message to see if labs drawn would be ok. Pt verbalized understanding.   Summary: medical ?   Patient called in wanting to ask DR Margarita Rana about a procedure he's having to have a boil removed. He wouldn't go into details.     Reason for Disposition  [1] Caller requesting NON-URGENT health information AND [2] PCP's office is the best resource  Answer Assessment - Initial Assessment Questions 1. REASON FOR CALL or QUESTION: "What is your reason for calling today?" or "How can I best help you?" or "What question do you have that I can help answer?"     Needing A1C rechecked d/t too high for surgery  Protocols used: Information Only Call - No Triage-A-AH

## 2022-09-01 DIAGNOSIS — I480 Paroxysmal atrial fibrillation: Secondary | ICD-10-CM | POA: Diagnosis not present

## 2022-09-02 ENCOUNTER — Ambulatory Visit (INDEPENDENT_AMBULATORY_CARE_PROVIDER_SITE_OTHER): Payer: Medicaid Other | Admitting: *Deleted

## 2022-09-02 DIAGNOSIS — J309 Allergic rhinitis, unspecified: Secondary | ICD-10-CM | POA: Diagnosis not present

## 2022-09-02 DIAGNOSIS — I480 Paroxysmal atrial fibrillation: Secondary | ICD-10-CM | POA: Diagnosis not present

## 2022-09-03 DIAGNOSIS — I480 Paroxysmal atrial fibrillation: Secondary | ICD-10-CM | POA: Diagnosis not present

## 2022-09-04 DIAGNOSIS — I480 Paroxysmal atrial fibrillation: Secondary | ICD-10-CM | POA: Diagnosis not present

## 2022-09-05 ENCOUNTER — Encounter
Admission: RE | Admit: 2022-09-05 | Discharge: 2022-09-05 | Disposition: A | Discharge status: Home / Self Care | Payer: Medicaid Other | Source: Ambulatory Visit | Attending: Surgery | Admitting: Surgery

## 2022-09-05 ENCOUNTER — Encounter: Payer: Self-pay | Admitting: Physical Therapy

## 2022-09-05 ENCOUNTER — Ambulatory Visit: Payer: Medicaid Other | Admitting: Physical Therapy

## 2022-09-05 VITALS — Ht 73.0 in | Wt 241.0 lb

## 2022-09-05 DIAGNOSIS — I480 Paroxysmal atrial fibrillation: Secondary | ICD-10-CM | POA: Diagnosis not present

## 2022-09-05 DIAGNOSIS — M6281 Muscle weakness (generalized): Secondary | ICD-10-CM | POA: Diagnosis not present

## 2022-09-05 DIAGNOSIS — R252 Cramp and spasm: Secondary | ICD-10-CM | POA: Diagnosis not present

## 2022-09-05 DIAGNOSIS — Z8673 Personal history of transient ischemic attack (TIA), and cerebral infarction without residual deficits: Secondary | ICD-10-CM

## 2022-09-05 DIAGNOSIS — M542 Cervicalgia: Secondary | ICD-10-CM | POA: Diagnosis not present

## 2022-09-05 DIAGNOSIS — R293 Abnormal posture: Secondary | ICD-10-CM | POA: Diagnosis not present

## 2022-09-05 DIAGNOSIS — Z01812 Encounter for preprocedural laboratory examination: Secondary | ICD-10-CM

## 2022-09-05 DIAGNOSIS — I700 Atherosclerosis of aorta: Secondary | ICD-10-CM

## 2022-09-05 DIAGNOSIS — I4891 Unspecified atrial fibrillation: Secondary | ICD-10-CM

## 2022-09-05 DIAGNOSIS — Z01818 Encounter for other preprocedural examination: Secondary | ICD-10-CM

## 2022-09-05 HISTORY — DX: Benign lipomatous neoplasm, unspecified: D17.9

## 2022-09-05 HISTORY — DX: Pilonidal cyst without abscess: L05.91

## 2022-09-05 HISTORY — DX: Anemia, unspecified: D64.9

## 2022-09-05 HISTORY — DX: Cannabis use, unspecified, uncomplicated: F12.90

## 2022-09-05 HISTORY — DX: Type 2 diabetes mellitus without complications: E11.9

## 2022-09-05 NOTE — Therapy (Signed)
OUTPATIENT PHYSICAL THERAPY TREATMENT NOTE   Patient Name: Jason Stokes MRN: [id number] DOB:1960/08/21, 62 y.o., male Today's Date: 09/05/2022  PCP: Charlott Rakes, MD   REFERRING PROVIDER: Ashok Pall, MD  END OF SESSION:   PT End of Session - 09/05/22 0813     Visit Number 2    Number of Visits 13    Date for PT Re-Evaluation 10/14/22    Authorization Type MEDICAID [id number] ACCESS    Authorization Time Period 09/05/22-09/18/22    Authorization - Visit Number 1    Authorization - Number of Visits 3    PT Start Time 0815   pt late   PT Stop Time 0855    PT Time Calculation (min) 40 min             Past Medical History:  Diagnosis Date   Anxiety    Bilateral carpal tunnel syndrome 01/10/2018   Bipolar disorder ([place])    Chronic lower back pain    Depression    Dysrhythmia    a-fib   GERD (gastroesophageal reflux disease)    History of alcohol abuse    History of nuclear stress test    Myoview 10/16: EF 50%, diaphragmatic attenuation, no ischemia, low risk   Hypertension    Migraine 2012-2014   Moderate persistent asthma with acute exacerbation 05/02/2018   PAF (paroxysmal atrial fibrillation) ([place]) 03/27/2015   a. Myoview neg for ischemia >> Flecainide started 10/16 >> FU ETT    Schizophrenia ([place])    Sciatica neuralgia    Small vessel disease ([place])    Right basal ganglia stroke   Stroke [facility]) "between 2012-2014"   residual "AF" (09/22/2015)   Past Surgical History:  Procedure Laterality Date   ATRIAL FIBRILLATION ABLATION  09/22/2015   ATRIAL FIBRILLATION ABLATION N/A 04/28/2022   Procedure: ATRIAL FIBRILLATION ABLATION;  Surgeon: Constance Haw, MD;  Location: [place] [facility];  Service: Cardiovascular;  Laterality: N/A;   CYST EXCISION  1996-97   surgery back of head    ELECTROPHYSIOLOGIC STUDY N/A 09/22/2015   Procedure: Atrial Fibrillation Ablation;  Surgeon: Will [name], MD;  Location: [place] [facility];  Service: Cardiovascular;   Laterality: N/A;   ELECTROPHYSIOLOGIC STUDY N/A 12/10/2015   Procedure: Atrial Fibrillation Ablation;  Surgeon: Will [name], MD;  Location: [place] [facility];  Service: Cardiovascular;  Laterality: N/A;   ELECTROPHYSIOLOGIC STUDY N/A 12/11/2015   Procedure: Cardioversion;  Surgeon: Will [name], MD;  Location: [place] [facility];  Service: Cardiovascular;  Laterality: N/A;   EMBOLIZATION Right 08/19/2020   Procedure: EMBOLIZATION;  Surgeon: Cherre Robins, MD;  Location: [place] [facility];  Service: Cardiovascular;  Laterality: Right;  hypogastric   EXCISION MASS HEAD N/A 01/06/2017   Procedure: EXCISION MASS FOREHEAD;  Surgeon: Irene Limbo, MD;  Location: [place];  Service: Plastics;  Laterality: N/A;   EXCISION MASS UPPER EXTREMETIES Right 08/08/2022   Procedure: EXCISION MASS RIGHT FOREARM;  Surgeon: Leanora Cover, MD;  Location: [place];  Service: Orthopedics;  Laterality: Right;  45 MIN   GANGLION CYST EXCISION Left    INTERCOSTAL NERVE BLOCK  2005   KNEE ARTHROSCOPY Right 2016   MASS EXCISION N/A 01/25/2021   Procedure: EXCISION SUBCUTANEOUS VS SUBFASCIAL MASS TORSO 3CM;  Surgeon: Irene Limbo, MD;  Location: [facility];  Service: Plastics;  Laterality: N/A;   Patient Active Problem List   Diagnosis Date Noted   Aortic atherosclerosis ([place]) 06/07/2022  Hypercoagulable state due to paroxysmal atrial fibrillation ([place]) 03/23/2021   Type 2 diabetes mellitus with hyperglycemia, without long-term current use of insulin ([place]) 02/10/2021   Neck pain on left side 03/06/2019   Musculoskeletal chest pain 07/24/2018   Asthma, mild intermittent 05/02/2018   Allergic rhinitis caused by mold 05/02/2018   Tobacco use 05/02/2018   Bilateral carpal tunnel syndrome 01/10/2018   Stroke ([place])    Schizophrenia ([place])    Migraine    History of nuclear stress test    History of alcohol abuse    GERD (gastroesophageal reflux  disease)    Dysrhythmia    Chronic lower back pain    Anxiety    Arthritis of knee, right 04/12/2017   Bipolar disorder ([place]) 04/03/2017   Lipoma of forehead 09/22/2016   Trigger ring finger of right hand 06/22/2016   Paroxysmal atrial fibrillation ([facility])    AF (atrial fibrillation) ([place]) 12/10/2015   Eczema 07/10/2015   History of CVA (cerebrovascular accident) 05/04/2015   Tear of medial meniscus of right knee 03/18/2015   Chronic pain of right knee 03/12/2015   Other and unspecified hyperlipidemia 11/25/2013   Nasal congestion 11/25/2013   Sinusitis, chronic 05/09/2013   Chronic low back pain 10/12/2012   Lumbar radiculopathy 10/12/2012   Hypertension 10/12/2012   Depression 10/12/2012   Insomnia 10/12/2012    REFERRING DIAG: M47.22 (ICD-10-CM) - Other spondylosis with radiculopathy, cervical region   THERAPY DIAG:  Cervicalgia  Muscle weakness (generalized)  Abnormal posture  Rationale for Evaluation and Treatment rehabilitation  PERTINENT HISTORY:  Migraines, bipolar disorder, hx of bilat carpal tunnel, PAF  PRECAUTIONS: None   WEIGHT BEARING RESTRICTIONS: No  SUBJECTIVE:                                                                                                                                                                                      SUBJECTIVE STATEMENT:  My neck got stiff this weekend. The exercises helped a little. I need another injection in my neck. I had one a couple of weeks ago.    PAIN:  Are you having pain? Yes: NPRS scale: 8/10 Pain location: neck pain R>L Pain description: Throb, ache Aggravating factors: Turning my head  Relieving factors: Not sure, heat and cold packs don't seem to help   OBJECTIVE: (objective measures completed at initial evaluation unless otherwise dated)   DIAGNOSTIC FINDINGS:                        CT scan 07/27/22 IMPRESSION: 1. No acute fracture or traumatic listhesis of the cervical spine. 2.  Degenerative disc disease most pronounced at  the C3-4 and C5-6 levels, without interval progression from recent previous CT and MRI.   PATIENT SURVEYS:  NDI=41, 82% complete disability   COGNITION: Overall cognitive status: Within functional limits for tasks assessed   SENSATION: WFL   POSTURE: rounded shoulders and forward head   PALPATION: TTP of the cervical paraspinals and upper trap with increased muscle tension, L>R   CERVICAL ROM:    Active ROM A/PROM (deg) eval  Flexion 35 pulling posterior bilat  Extension 40 pulling R lateral  Right lateral flexion 15 pulling bilat  Left lateral flexion 15 " needs to pop"  Right rotation 30 puling L lateral  Left rotation 40 pulling R lateral   (Blank rows = not tested)   UPPER EXTREMITY ROM: Passively able to lift arms to 110d bilat before report of pain as limitation Active ROM Right eval Left eval  Shoulder flexion 90 90  Shoulder extension      Shoulder abduction      Shoulder adduction      Shoulder extension      Shoulder internal rotation      Shoulder external rotation      Elbow flexion      Elbow extension      Wrist flexion      Wrist extension      Wrist ulnar deviation      Wrist radial deviation      Wrist pronation      Wrist supination       (Blank rows = not tested)   UPPER EXTREMITY MMT: Grossly 4+ to 5/5 and equal bilat. Neck weakness noted per poor posture MMT Right eval Left eval  Shoulder flexion      Shoulder extension      Shoulder abduction      Shoulder adduction      Shoulder extension      Shoulder internal rotation      Shoulder external rotation      Middle trapezius      Lower trapezius      Elbow flexion      Elbow extension      Wrist flexion      Wrist extension      Wrist ulnar deviation      Wrist radial deviation      Wrist pronation      Wrist supination      Grip strength       (Blank rows = not tested)   CERVICAL SPECIAL TESTS:  Spurling's test positive;  Distraction test positive   FUNCTIONAL TESTS:  NT   TODAY'S TREATMENT:  [facility] Adult PT Treatment:                                                DATE: 09/05/22 Therapeutic Exercise: Seated chin tucks  Seated scap retract Seated cervical rotation AROM Seated levator stretch x 3 each  Standing green band rows  Standing shoulder ext green  Stnading open books- modified hand on chest  Pec stretch in doorway   Manual Therapy: STW posterior neck, passive cervical rotation  [facility] Adult PT Treatment:                                                DATE: 08/25/22 Therapeutic Exercise: Developed, instructed in, and pt completed therex as noted in HEP    PATIENT EDUCATION:  Education details: Eval findings, POC, HEP, self care  Person educated: Patient Education method: Explanation, Demonstration, Tactile cues, Verbal cues, and Handouts Education comprehension: verbalized understanding, returned demonstration, verbal cues required, and tactile cues required   HOME EXERCISE PROGRAM: Access Code: [email or web address] URL: [email or web address] Date: 08/25/2022 Prepared by: Gar Ponto   Exercises - Seated Cervical Retraction  - 6 x daily - 7 x weekly - 1 sets - 3-5 reps - 3 hold - Seated Scapular Retraction  - 6 x daily - 7 x weekly - 1 sets - 3-5 reps - 3 hold - Seated Cervical Rotation AROM  - 6 x daily - 7 x weekly - 3 sets - 3 reps - 15 hold Added 09/05/22 - Standing Row with Anchored Resistance  - 1 x daily - 7 x weekly - 3 sets - 10 reps - Shoulder extension with resistance - Neutral  - 1 x daily - 7 x weekly - 3 sets - 10 reps   ASSESSMENT:   CLINICAL IMPRESSION: Patient is a 62 y.o. male who was seen today for physical therapy evaluation and treatment for M47.22 (ICD-10-CM) - Other spondylosis with radiculopathy, cervical region. Pt reports neck stiffness  over the weekend that was improved with HEP. He is requesting band exercises and new bands as his from previous episodes have broken. Progressed current HEP to include standing Scap strength. Performed STW followed by HMP to reduce pain and tension. Pt will benefit from skilled PT to address impairments for improved neck function.    OBJECTIVE IMPAIRMENTS: decreased activity tolerance, decreased ROM, decreased strength, increased muscle spasms, impaired flexibility, impaired UE functional use, postural dysfunction, obesity, and pain.    ACTIVITY LIMITATIONS: carrying, lifting, bending, sitting, sleeping, dressing, reach over head, and caring for others   PARTICIPATION LIMITATIONS: meal prep, cleaning, laundry, and driving   PERSONAL FACTORS: Age, Fitness, Past/current experiences, Time since onset of injury/illness/exacerbation, and 1 comorbidity: high BMI  are also affecting patient's functional outcome.    REHAB POTENTIAL: Good   CLINICAL DECISION MAKING: Evolving/moderate complexity   EVALUATION COMPLEXITY: Moderate     GOALS:     SHORT TERM GOALS: Target date: 09/16/22   Pt will be Ind in an initial HEP Baseline: initial Goal status: INITIAL   2.  Pt will voice understanding of measures to assist in pain reduction Baseline: initial Goal status: INITIAL   LONG TERM GOALS: Target date: 10/14/22   Pt will be Ind in a final HEP to maintain achieved LOF  Baseline: initiated Goal status: INITIAL   2.  Pt will be able to demonstrate proper sitting posture Baseline: Poor posture Goal status: INITIAL   3.  Increase cervical side bending and and rotation for improved neck function, especially with driving Baseline: see flow sheets Goal status: INITIAL   4.  Pt will demonstrate DNF endurance of 20" with a single attempt Baseline: NT Goal status: INITIAL   5.  Pt will report neck pain of 4/10 or less for improved neck function and QOL Baseline: 8/10 Goal status: INITIAL      PLAN:  PT FREQUENCY: 2x/week   PT DURATION: 8 weeks   PLANNED INTERVENTIONS: Therapeutic exercises, Therapeutic activity, Patient/Family education, Self Care, Dry Needling, Electrical stimulation, Spinal manipulation, Spinal mobilization, Cryotherapy, Moist heat, Taping, Traction, Ultrasound, Ionotophoresis 14m/ml Dexamethasone, Manual therapy, and Re-evaluation   PLAN FOR NEXT SESSION: Review NDI; assess response to HEP; progress therex as indicated; use of modalities, manual therapy; and TPDN as indicated.    JHessie Diener PTA 09/05/22 9:34 AM Phone: [phone]: [phone]

## 2022-09-05 NOTE — Patient Instructions (Addendum)
Your procedure is scheduled on:09-13-22 Tuesday Report to the Registration Desk on the 1st floor of the [facility].Then proceed to the 2nd floor Surgery Desk To find out your arrival time, please call [phone] between 1PM - 3PM on:09-12-22 Monday If your arrival time is 6:00 am, do not arrive before that time as the [facility] entrance doors do not open until 6:00 am.  REMEMBER: Instructions that are not followed completely may result in serious medical risk, up to and including death; or upon the discretion of your surgeon and anesthesiologist your surgery may need to be rescheduled.  Do not eat food after midnight the night before surgery.  No gum chewing or hard candies.  You may however, drink Water up to 2 hours before you are scheduled to arrive for your surgery. Do not drink anything within 2 hours of your scheduled arrival time.  One week prior to surgery: Stop Anti-inflammatories (NSAIDS) such as Advil, Aleve, Ibuprofen, Motrin, Naproxen, Naprosyn and Aspirin based products such as Excedrin, Goody's Powder, BC Powder.You may however, take Tylenol if needed for pain up until the day of surgery.  Stop ANY OVER THE COUNTER supplements/vitamins NOW (09-15-22) until after surgery (Vitamin D3 and Garlic)  Stop your apixaban (ELIQUIS) 1 day prior to surgery as instructed by your Cardiologist-Last dose will be on 09-11-22 Sunday  Stop your metFORMIN (GLUCOPHAGE) 2 days prior to surgery-Last dose will be on 09-10-22 Saturday  TAKE ONLY THESE MEDICATIONS THE MORNING OF SURGERY WITH A SIP OF WATER: -atorvastatin (LIPITOR)  -carvedilol (COREG)  -cetirizine (ZYRTEC)  -diltiazem (CARDIZEM CD)  -DULoxetine (CYMBALTA)  -omeprazole (PRILOSEC)-take one the night before and one on the morning of surgery - helps to prevent nausea after surgery.)  Use your Albuterol Inhaler the day of surgery and bring your Albuterol Inhaler to the hospital  No Alcohol for 24 hours before or after  surgery.  No Smoking including e-cigarettes for 24 hours before surgery.  No chewable tobacco products for at least 6 hours before surgery.  No nicotine patches on the day of surgery.  Do not use any "recreational" drugs for at least a week (preferably 2 weeks) before your surgery.  Please be advised that the combination of cocaine and anesthesia may have negative outcomes, up to and including death. If you test positive for cocaine, your surgery will be cancelled.  On the morning of surgery brush your teeth with toothpaste and water, you may rinse your mouth with mouthwash if you wish. Do not swallow any toothpaste or mouthwash.  Use CHG Soap as directed on instruction sheet.  Do not wear jewelry, make-up, hairpins, clips or nail polish.  Do not wear lotions, powders, or perfumes.   Do not shave body hair from the neck down 48 hours before surgery.  Contact lenses, hearing aids and dentures may not be worn into surgery.  Do not bring valuables to the hospital. [facility] is not responsible for any missing/lost belongings or valuables.   Notify your doctor if there is any change in your medical condition (cold, fever, infection).  Wear comfortable clothing (specific to your surgery type) to the hospital.  After surgery, you can help prevent lung complications by doing breathing exercises.  Take deep breaths and cough every 1-2 hours. Your doctor may order a device called an Incentive Spirometer to help you take deep breaths. When coughing or sneezing, hold a pillow firmly against your incision with both hands. This is called "splinting." Doing this helps protect your incision.  It also decreases belly discomfort.  If you are being admitted to the hospital overnight, leave your suitcase in the car. After surgery it may be brought to your room.  In case of increased patient census, it may be necessary for you, the patient, to continue your postoperative care in the Same Day Surgery  department.  If you are being discharged the day of surgery, you will not be allowed to drive home. You will need a responsible individual to drive you home and stay with you for 24 hours after surgery.   If you are taking public transportation, you will need to have a responsible individual with you.  Please call the [facility] Dept. at [phone] if you have any questions about these instructions.  Surgery Visitation Policy:  Patients undergoing a surgery or procedure may have two family members or support persons with them as long as the person is not COVID-19 positive or experiencing its symptoms.   Due to an increase in RSV and influenza rates and associated hospitalizations, children ages 46 and under will not be able to visit patients in [facility]. Masks continue to be strongly recommended.     Preparing for Surgery with CHLORHEXIDINE GLUCONATE (CHG) Soap  Chlorhexidine Gluconate (CHG) Soap  o An antiseptic cleaner that kills germs and bonds with the skin to continue killing germs even after washing  o Used for showering the night before surgery and morning of surgery  Before surgery, you can play an important role by reducing the number of germs on your skin.  CHG (Chlorhexidine gluconate) soap is an antiseptic cleanser which kills germs and bonds with the skin to continue killing germs even after washing.  Please do not use if you have an allergy to CHG or antibacterial soaps. If your skin becomes reddened/irritated stop using the CHG.  1. Shower the NIGHT BEFORE SURGERY and the MORNING OF SURGERY with CHG soap.  2. If you choose to wash your hair, wash your hair first as usual with your normal shampoo.  3. After shampooing, rinse your hair and body thoroughly to remove the shampoo.  4. Use CHG as you would any other liquid soap. You can apply CHG directly to the skin and wash gently with a scrungie or a clean washcloth.  5. Apply the CHG  soap to your body only from the neck down. Do not use on open wounds or open sores. Avoid contact with your eyes, ears, mouth, and genitals (private parts). Wash face and genitals (private parts) with your normal soap.  6. Wash thoroughly, paying special attention to the area where your surgery will be performed.  7. Thoroughly rinse your body with warm water.  8. Do not shower/wash with your normal soap after using and rinsing off the CHG soap.  9. Pat yourself dry with a clean towel.  10. Wear clean pajamas to bed the night before surgery.  12. Place clean sheets on your bed the night of your first shower and do not sleep with pets.  13. Shower again with the CHG soap on the day of surgery prior to arriving at the hospital.  14. Do not apply any deodorants/lotions/powders.  15. Please wear clean clothes to the hospital.

## 2022-09-06 ENCOUNTER — Encounter
Admission: RE | Admit: 2022-09-06 | Discharge: 2022-09-06 | Disposition: A | Discharge status: Home / Self Care | Payer: Medicaid Other | Source: Ambulatory Visit | Attending: Surgery | Admitting: Surgery

## 2022-09-06 DIAGNOSIS — I700 Atherosclerosis of aorta: Secondary | ICD-10-CM | POA: Diagnosis not present

## 2022-09-06 DIAGNOSIS — Z8673 Personal history of transient ischemic attack (TIA), and cerebral infarction without residual deficits: Secondary | ICD-10-CM | POA: Diagnosis not present

## 2022-09-06 DIAGNOSIS — I480 Paroxysmal atrial fibrillation: Secondary | ICD-10-CM | POA: Diagnosis not present

## 2022-09-06 DIAGNOSIS — I4891 Unspecified atrial fibrillation: Secondary | ICD-10-CM | POA: Diagnosis not present

## 2022-09-06 DIAGNOSIS — Z01812 Encounter for preprocedural laboratory examination: Secondary | ICD-10-CM | POA: Diagnosis not present

## 2022-09-06 LAB — CBC
HCT: 39.1 % (ref 39.0–52.0)
Hemoglobin: 12 g/dL — ABNORMAL LOW (ref 13.0–17.0)
MCH: 23 pg — ABNORMAL LOW (ref 26.0–34.0)
MCHC: 30.7 g/dL (ref 30.0–36.0)
MCV: 74.9 fL — ABNORMAL LOW (ref 80.0–100.0)
Platelets: 155 10*3/uL (ref 150–400)
RBC: 5.22 MIL/uL (ref 4.22–5.81)
RDW: 15.3 % (ref 11.5–15.5)
WBC: 5 10*3/uL (ref 4.0–10.5)
nRBC: 0 % (ref 0.0–0.2)

## 2022-09-06 NOTE — Therapy (Signed)
OUTPATIENT PHYSICAL THERAPY TREATMENT NOTE   Patient Name: Jason Stokes MRN: [id number] DOB:[birth year], 62 y.o., male Today's Date: 09/07/2022  PCP: Charlott Rakes, MD   REFERRING PROVIDER: Ashok Pall, MD  END OF SESSION:   PT End of Session - 09/07/22 0810     Visit Number 3    Number of Visits 13    Date for PT Re-Evaluation 10/14/22    Authorization Type MEDICAID [place] ACCESS    Authorization Time Period 09/05/22-09/18/22    Authorization - Visit Number 2    Authorization - Number of Visits 3    Progress Note Due on Visit 10    PT Start Time 0805    PT Stop Time 0845    PT Time Calculation (min) 40 min    Activity Tolerance Patient tolerated treatment well    Behavior During Therapy [facility] for tasks assessed/performed              Past Medical History:  Diagnosis Date   Anemia    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[place])    a.) s/p embolization 08/19/2020: 29 mm RIGHT internal iliac artery aneurysm   Anxiety    Aortic atherosclerosis (HCC)    Bilateral carpal tunnel syndrome 01/10/2018   Bipolar disorder ([place])    Chorioretinal inflammation of both eyes    a.) on azothioprine   Chronic lower back pain    Depression    Diastolic dysfunction    a.) TTE 01/13/2021: EF 60-65%, mod LVH, triv MR, G1DD   GERD (gastroesophageal reflux disease)    Hepatic steatosis    History of alcohol abuse    History of nuclear stress test    Myoview 10/16: EF 50%, diaphragmatic attenuation, no ischemia, low risk   Hypertension    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   a.) CT head 12/25/2012 --> RIGHT basal ganglia hypoattenuation related to remote lacunar infarct   Lipoma    Long term (current) use of anticoagulants    a.) apixaban   Long-term current use of immunomodulator    a.) on azothioprine for peripheral focal chorioretinal inflammation (both eyes)   Marijuana use    Migraine    Moderate persistent asthma with acute exacerbation 05/02/2018   PAF (paroxysmal  atrial fibrillation) ([place]) 03/27/2015   a.) CHA2DS2VASc = 5 (HTN, CVA x2, vascular disease history, T2DM);  b.) s/p ablation 10/02/2015; c.) s/p ablation 12/10/2015; d.) s/p DCCV (200 J x 1) 12/11/2015; e.) s/p ablation 04/28/2022; f.) rate/rhythm maintained on oral diltiazem + carvedilol; chronically anticoagulated with apixaban   Pilonidal cyst    Schizophrenia ([place])    Sciatica neuralgia    T2DM (type 2 diabetes mellitus) ([place])    Thoracic aortic ectasia ([place]) 01/13/2021   a.) TTE 01/13/2021: Ao root 38 mm, asc Ao 39 mm   Past Surgical History:  Procedure Laterality Date   ATRIAL FIBRILLATION ABLATION  09/22/2015   ATRIAL FIBRILLATION ABLATION N/A 04/28/2022   Procedure: ATRIAL FIBRILLATION ABLATION;  Surgeon: Constance Haw, MD;  Location: [facility];  Service: Cardiovascular;  Laterality: N/A;   CATARACT EXTRACTION Bilateral    CYST EXCISION  1996-97   surgery back of head    ELECTROPHYSIOLOGIC STUDY N/A 09/22/2015   Procedure: Atrial Fibrillation Ablation;  Surgeon: Will [name], MD;  Location: [facility];  Service: Cardiovascular;  Laterality: N/A;   ELECTROPHYSIOLOGIC STUDY N/A 12/10/2015   Procedure: Atrial Fibrillation Ablation;  Surgeon: Will [name], MD;  Location: [facility];  Service: Cardiovascular;  Laterality: N/A;   ELECTROPHYSIOLOGIC STUDY N/A 12/11/2015   Procedure: Cardioversion;  Surgeon: Will [name], MD;  Location: [facility];  Service: Cardiovascular;  Laterality: N/A;   EMBOLIZATION Right 08/19/2020   Procedure: EMBOLIZATION;  Surgeon: Cherre Robins, MD;  Location: [facility];  Service: Cardiovascular;  Laterality: Right;  hypogastric   EXCISION MASS HEAD N/A 01/06/2017   Procedure: EXCISION MASS FOREHEAD;  Surgeon: Irene Limbo, MD;  Location: [facility];  Service: Plastics;  Laterality: N/A;   EXCISION MASS UPPER EXTREMETIES Right 08/08/2022   Procedure: EXCISION MASS  RIGHT FOREARM;  Surgeon: Leanora Cover, MD;  Location: [facility];  Service: Orthopedics;  Laterality: Right;  45 MIN   GANGLION CYST EXCISION Left    INTERCOSTAL NERVE BLOCK  07/19/2003   KNEE ARTHROSCOPY Right 07/18/2014   MASS EXCISION N/A 01/25/2021   Procedure: EXCISION SUBCUTANEOUS VS SUBFASCIAL MASS TORSO 3CM;  Surgeon: Irene Limbo, MD;  Location: [facility];  Service: Plastics;  Laterality: N/A;   Patient Active Problem List   Diagnosis Date Noted   Aortic atherosclerosis ([place]) 06/07/2022   Hypercoagulable state due to paroxysmal atrial fibrillation ([place]) 03/23/2021   Type 2 diabetes mellitus with hyperglycemia, without long-term current use of insulin ([place]) 02/10/2021   Neck pain on left side 03/06/2019   Musculoskeletal chest pain 07/24/2018   Asthma, mild intermittent 05/02/2018   Allergic rhinitis caused by mold 05/02/2018   Tobacco use 05/02/2018   Bilateral carpal tunnel syndrome 01/10/2018   Stroke ([place])    Schizophrenia ([place])    Migraine    History of nuclear stress test    History of alcohol abuse    GERD (gastroesophageal reflux disease)    Dysrhythmia    Chronic lower back pain    Anxiety    Arthritis of knee, right 04/12/2017   Bipolar disorder ([place]) 04/03/2017   Lipoma of forehead 09/22/2016   Trigger ring finger of right hand 06/22/2016   Paroxysmal atrial fibrillation (HCC)    AF (atrial fibrillation) ([place]) 12/10/2015   Eczema 07/10/2015   History of CVA (cerebrovascular accident) 05/04/2015   Tear of medial meniscus of right knee 03/18/2015   Chronic pain of right knee 03/12/2015   Other and unspecified hyperlipidemia 11/25/2013   Nasal congestion 11/25/2013   Sinusitis, chronic 05/09/2013   Chronic low back pain 10/12/2012   Lumbar radiculopathy 10/12/2012   Hypertension 10/12/2012   Depression 10/12/2012   Insomnia 10/12/2012    REFERRING DIAG: M47.22 (ICD-10-CM) - Other spondylosis with radiculopathy,  cervical region   THERAPY DIAG:  Cervicalgia  Muscle weakness (generalized)  Abnormal posture  Cramp and spasm  Rationale for Evaluation and Treatment rehabilitation  PERTINENT HISTORY:  Migraines, bipolar disorder, hx of bilat carpal tunnel, PAF  PRECAUTIONS: None   WEIGHT BEARING RESTRICTIONS: No  SUBJECTIVE:  SUBJECTIVE STATEMENT:  My L side of my neck is feeling better. My R neck is stiil bothering me. The exs help to loosen my neck up.  PAIN:  Are you having pain? Yes: NPRS scale: 8/10 Pain location: neck pain R>L Pain description: Throb, ache Aggravating factors: Turning my head  Relieving factors: Not sure, heat and cold packs don't seem to help   OBJECTIVE: (objective measures completed at initial evaluation unless otherwise dated)   DIAGNOSTIC FINDINGS:                        CT scan 07/27/22 IMPRESSION: 1. No acute fracture or traumatic listhesis of the cervical spine. 2. 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   PATIENT SURVEYS:  NDI=41, 82% complete disability   COGNITION: Overall cognitive status: Within functional limits for tasks assessed   SENSATION: WFL   POSTURE: rounded shoulders and forward head   PALPATION: TTP of the cervical paraspinals and upper trap with increased muscle tension, L>R   CERVICAL ROM:    Active ROM A/PROM (deg) eval  Flexion 35 pulling posterior bilat  Extension 40 pulling R lateral  Right lateral flexion 15 pulling bilat  Left lateral flexion 15 " needs to pop"  Right rotation 30 puling L lateral  Left rotation 40 pulling R lateral   (Blank rows = not tested)   UPPER EXTREMITY ROM: Passively able to lift arms to 110d bilat before report of  pain as limitation Active ROM Right eval Left eval  Shoulder flexion 90 90  Shoulder extension      Shoulder abduction      Shoulder adduction      Shoulder extension      Shoulder internal rotation      Shoulder external rotation      Elbow flexion      Elbow extension      Wrist flexion      Wrist extension      Wrist ulnar deviation      Wrist radial deviation      Wrist pronation      Wrist supination       (Blank rows = not tested)   UPPER EXTREMITY MMT: Grossly 4+ to 5/5 and equal bilat. Neck weakness noted per poor posture MMT Right eval Left eval  Shoulder flexion      Shoulder extension      Shoulder abduction      Shoulder adduction      Shoulder extension      Shoulder internal rotation      Shoulder external rotation      Middle trapezius      Lower trapezius      Elbow flexion      Elbow extension      Wrist flexion      Wrist extension      Wrist ulnar deviation      Wrist radial deviation      Wrist pronation      Wrist supination      Grip strength       (Blank rows = not tested)   CERVICAL SPECIAL TESTS:  Spurling's test positive; Distraction test positive   FUNCTIONAL TESTS:  NT   TODAY'S TREATMENT:  [facility] Adult PT Treatment:  DATE: 09/07/22 Therapeutic Exercise: Supine chin tucks x10 3" Supine DNF lift offs x5  5" Supine shouldr hor abd star pattern 3x5 GTB Supine shoulder ER 3x10 GTB Seated cervical rotation x3 15" Seated levator stretch x 3 each 15" Modalities: Attended Estim for pain relief to the lower cervical and upper traps bilat. Premod at 33 intensity level x 15 mins  [facility] Adult PT Treatment:                                                DATE: 09/05/22 Therapeutic Exercise: Seated chin tucks  Seated scap retract Seated cervical rotation AROM Seated levator stretch x 3 each  Standing green band rows  Standing shoulder ext green  Stnading open books- modified hand on chest   Pec stretch in doorway   Manual Therapy: STW posterior neck, passive cervical rotation                                                                                                                   [facility] Adult PT Treatment:                                                DATE: 08/25/22 Therapeutic Exercise: Developed, instructed in, and pt completed therex as noted in HEP    PATIENT EDUCATION:  Education details: Eval findings, POC, HEP, self care  Person educated: Patient Education method: Explanation, Demonstration, Tactile cues, Verbal cues, and Handouts Education comprehension: verbalized understanding, returned demonstration, verbal cues required, and tactile cues required   HOME EXERCISE PROGRAM: Access Code: [email or web address] URL: [email or web address] Date: 09/07/2022 Prepared by: Gar Ponto  Exercises - Seated Cervical Retraction  - 6 x daily - 7 x weekly - 1 sets - 3-5 reps - 3 hold - Supine Deep Neck Flexor Training - Hold  - 1 x daily - 7 x weekly - 1 sets - 10 reps - 5 hold - Seated Scapular Retraction  - 6 x daily - 7 x weekly - 1 sets - 3-5 reps - 3 hold - Seated Cervical Rotation AROM  - 6 x daily - 7 x weekly - 1 sets - 3 reps - 15 hold - Gentle Levator Scapulae Stretch  - 1 x daily - 7 x weekly - 1 sets - 3 reps - 15 hold - Standing Row with Anchored Resistance  - 1 x daily - 7 x weekly - 3 sets - 10 reps - Shoulder extension with resistance - Neutral  - 1 x daily - 7 x weekly - 3 sets - 10 reps   ASSESSMENT:   CLINICAL IMPRESSION: Pt was competed for cervical flexibility and postural/posterior chain strengthening. Pt's HEP was updated. Pt returned demonstration of therex. Pt then received Premod estim to  the lower cervical and upper traps for pain relief. Afterwards, pt reported a decrease in his R cervical pain. Pt tolerated PT today without adverse effects. Pt will continue to benefit from skilled PT to address impairments for improved function.    OBJECTIVE IMPAIRMENTS: decreased activity tolerance, decreased ROM, decreased strength, increased muscle spasms, impaired flexibility, impaired UE functional use, postural dysfunction, obesity, and pain.    ACTIVITY LIMITATIONS: carrying, lifting, bending, sitting, sleeping, dressing, reach over head, and caring for others   PARTICIPATION LIMITATIONS: meal prep, cleaning, laundry, and driving   PERSONAL FACTORS: Age, Fitness, Past/current experiences, Time since onset of injury/illness/exacerbation, and 1 comorbidity: high BMI  are also affecting patient's functional outcome.    REHAB POTENTIAL: Good   CLINICAL DECISION MAKING: Evolving/moderate complexity   EVALUATION COMPLEXITY: Moderate     GOALS:     SHORT TERM GOALS: Target date: 09/16/22   Pt will be Ind in an initial HEP Baseline: initial Goal status: INITIAL   2.  Pt will voice understanding of measures to assist in pain reduction Baseline: initial Goal status: INITIAL   LONG TERM GOALS: Target date: 10/14/22   Pt will be Ind in a final HEP to maintain achieved LOF  Baseline: initiated Goal status: INITIAL   2.  Pt will be able to demonstrate proper sitting posture Baseline: Poor posture Goal status: INITIAL   3.  Increase cervical side bending and and rotation for improved neck function, especially with driving Baseline: see flow sheets Goal status: INITIAL   4.  Pt will demonstrate DNF endurance of 20" with a single attempt Baseline: NT Goal status: INITIAL   5.  Pt will report neck pain of 4/10 or less for improved neck function and QOL Baseline: 8/10 Goal status: INITIAL     PLAN:   PT FREQUENCY: 2x/week   PT DURATION: 8 weeks   PLANNED INTERVENTIONS: Therapeutic exercises, Therapeutic activity, Patient/Family education, Self Care, Dry Needling, Electrical stimulation, Spinal manipulation, Spinal mobilization, Cryotherapy, Moist heat, Taping, Traction, Ultrasound, Ionotophoresis 82m/ml  Dexamethasone, Manual therapy, and Re-evaluation   PLAN FOR NEXT SESSION: Review NDI; assess response to HEP; progress therex as indicated; use of modalities, manual therapy; and TPDN as indicated.   Lakasha Mcfall MS, PT 09/07/22 6:04 PM

## 2022-09-07 ENCOUNTER — Telehealth: Payer: Self-pay | Admitting: Family Medicine

## 2022-09-07 ENCOUNTER — Encounter: Payer: Self-pay | Admitting: Surgery

## 2022-09-07 ENCOUNTER — Ambulatory Visit: Payer: Medicaid Other

## 2022-09-07 DIAGNOSIS — R293 Abnormal posture: Secondary | ICD-10-CM

## 2022-09-07 DIAGNOSIS — I480 Paroxysmal atrial fibrillation: Secondary | ICD-10-CM | POA: Diagnosis not present

## 2022-09-07 DIAGNOSIS — M542 Cervicalgia: Secondary | ICD-10-CM

## 2022-09-07 DIAGNOSIS — R252 Cramp and spasm: Secondary | ICD-10-CM | POA: Diagnosis not present

## 2022-09-07 DIAGNOSIS — M6281 Muscle weakness (generalized): Secondary | ICD-10-CM

## 2022-09-07 NOTE — Telephone Encounter (Signed)
Copied from [facility] [phone]. Topic: General - Inquiry >> Sep 06, 2022  3:09 PM Marcellus [name] wrote: Reason for CRM: Pt stated he is getting ready to have a procedure done and needs clearance form filled out by the PCP. Stated it was already sent to [facility] . Pt is requesting an update procedure is on Tuesday, 02/27.  Please advise.

## 2022-09-07 NOTE — Telephone Encounter (Signed)
Patient was called and informed that clearance form has been faxed

## 2022-09-07 NOTE — Progress Notes (Unsigned)
Medical Clearance has been received from Dr Margarita Rana. The patient is cleared at Medium risk and may hold his Eliquis for 48 hours prior to surgery.

## 2022-09-08 ENCOUNTER — Encounter: Payer: Self-pay | Admitting: Surgery

## 2022-09-08 DIAGNOSIS — I480 Paroxysmal atrial fibrillation: Secondary | ICD-10-CM | POA: Diagnosis not present

## 2022-09-08 NOTE — Progress Notes (Signed)
Perioperative / Anesthesia Services  Pre-Admission Testing Clinical Review / Preoperative Anesthesia Consult  Date: 09/09/22  Patient Demographics:  Name: Jason Stokes DOB:   [birth year] MRN:   [id number]  Planned Surgical Procedure(s):    Case: [id number] Date/Time: 09/13/22 1309   Procedure: CYST EXCISION PILONIDAL EXTENSIVE   Anesthesia type: General   Pre-op diagnosis: pilonidal cyst   Location: [facility] OR ROOM 06 / [place] [facility]   Surgeons: Olean Ree, MD     NOTE: Available PAT nursing documentation and vital signs have been reviewed. Clinical nursing staff has updated patient's PMH/PSHx, current medication list, and drug allergies/intolerances to ensure comprehensive history available to assist in medical decision making as it pertains to the aforementioned surgical procedure and anticipated anesthetic course. Extensive review of available clinical information personally performed. Jason Stokes PMH and PSHx updated with any diagnoses/procedures that  may have been inadvertently omitted during his intake with the pre-admission testing department's nursing staff.  Clinical Discussion:  Jason Stokes is a 62 y.o. male who is submitted for pre-surgical anesthesia review and clearance prior to him undergoing the above procedure. Patient is a Current Smoker. 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  Patient is followed by cardiology Curt Bears, MD). He was last seen in the cardiology clinic on 08/02/2022; notes reviewed. At the time of his clinic visit, patient doing well overall from a cardiovascular perspective. Patient denied any chest pain, shortness of breath, PND,  orthopnea, palpitations, significant peripheral edema, weakness, fatigue, vertiginous symptoms, or presyncope/syncope. Patient with a past medical history significant for cardiovascular diagnoses. Documented physical exam was grossly benign, providing no evidence of acute exacerbation and/or decompensation of the patient's known cardiovascular conditions.  CT imaging of the head performed on 12/25/2012 demonstrated evidence of an area of hypoattenuation in the RIGHT basal ganglia related to a remote lacunar infarct.  Myocardial perfusion imaging study performed on 05/15/2015 revealing a low normal left ventricular systolic function with an EF of 50%.  There was a moderate in size, mild in intensity, fixed defect in the mid and basal inferior and basal inferolateral wall consistent with diaphragmatic attenuation.  There was no evidence of stress-induced myocardial ischemia or arrhythmia; no scintigraphic evidence of scar.  Study determined to be normal and low risk overall.  Patient has a history of atrial fibrillation.  Patient underwent cardiac ablation on 10/02/2015.  Due to refractory atrial arrhythmia, patient underwent repeat cardiac ablation on 12/10/2015.  Patient with almost immediate recurrence of atrial arrhythmia.  He underwent DCCV procedure the following day, at which time he received a single 200 J synchronized cardioversion, which converted him to normal sinus rhythm.  Again, patient developed recurrent atrial arrhythmia.  He underwent last cardiac ablation on 04/28/2022.  Since that time, patient has remained in normal sinus rhythm.  Patient found to have a 29 mm RIGHT internal iliac artery aneurysm.  Patient underwent definitive treatment with embolization on 08/19/2020.  Most recent TTE was performed on 01/13/2021 revealing normal left ventricular systolic function with an EF of 60-65%.  There were no regional wall motion abnormalities.  There was moderate LVH. Left ventricular  diastolic Doppler parameters consistent with abnormal relaxation (G1DD).  Right ventricular size and function normal.  Trivial mitral valve regurgitation observed.  There  was borderline dilatation of the aortic root measuring 38 mm.  Additionally, there was mild dilatation of the ascending aorta measuring 39 mm.  All transvalvular gradients were noted to be normal providing no evidence suggestive of valvular stenosis.  Coronary CTA performed on 04/21/2022 revealed a coronary calcium score of 20.6, which was the 61st percentile for age and sex matched control.  There was no evidence of PFO or ASD.  Study demonstrated RIGHT-sided dominance with normal coronary origin.  There was no thrombus within the LAA.  Again, patient with an atrial fibrillation diagnosis; CHA2DS2-VASc Score = 5 (HTN, CVA x 2, vascular disease history, T2DM).  Rate and rhythm currently being maintained on oral diltiazem + carvedilol.  Patient is chronically anticoagulated using standard dose apixaban.  Patient reported to be compliant with prescribed anticoagulation therapy with no evidence or reports of GI bleeding.  Blood pressure reasonably controlled at 138/82 mmHg on currently prescribed beta-blocker (carvedilol) and CCB (diltiazem) therapies.  Patient is on atorvastatin for his HLD diagnosis and ASCVD prevention.  T2DM suboptimally controlled on currently prescribed regimen; last HgbA1c was 8.1% when checked on 08/16/2022. Patient does not have an OSAH diagnosis. Functional capacity, as defined by DASI, is documented as being >/= 4 METS.  No changes were made to his medication regimen.  Patient to follow-up with outpatient cardiology in 6 months or sooner if needed.  Jason Stokes is scheduled for an elective EXTENSIVE PILONIDAL CYST EXCISION on 09/13/2022 with Dr. Ardath Sax, MD.  Given patient's past medical history significant for cardiovascular diagnoses, presurgical cardiac clearance was sought by the PAT team.  Additionally,  surgeons office also requested clearance from patient's primary care provider.  Specialty clearances were obtained as follows.    Per internal/family medicine Margarita Rana, MD), "patient is cleared to proceed with planned surgical intervention at an MODERATE risk".  Per cardiology Barbarann Ehlers, NP-C), "this patient is optimized for surgery and may proceed with the planned procedural course with an ACCEPTABLE risk of significant perioperative cardiovascular complications".    Again, this patient is on daily anticoagulation therapy.  Surgeon requesting 2-day hold of the patient's apixaban dose.  Primary care provider cleared patient for 2-day hold, however cardiology only cleared patient to hold medication for a single day.  Follow-up with surgeons office regarding anticoagulation washout period and was advised that per Dr. Hampton Abbot, recommendations from cardiology would be followed.  Patient has been instructed on need to hold his medication for 1 day prior to his procedure with plans to restart as soon as postoperatively respectively minimized by his primary attending surgeon.  Patient is aware that his last dose of apixaban should be on 09/11/2022.  Patient denies previous perioperative complications with anesthesia in the past. In review of the available records, it is noted that patient underwent a MAC + regional Bier block) anesthetic course at [facility] ([facility]) in 07/2022 without documented complications.      09/05/2022    1:00 PM 08/26/2022   10:31 AM 08/24/2022    1:39 PM  Vitals with BMI  Height '6\' 1"'$  '6\' 1"'$    Weight 240 lbs 15 oz 241 lbs   BMI [id number]   Systolic  [id number]  Diastolic  83 78  Pulse  77 90    Providers/Specialists:   NOTE: Primary physician provider listed below. Patient may have been seen by APP or partner within same practice.   PROVIDER ROLE / SPECIALTY LAST Delight Stare, MD General Surgery (Surgeon) 08/26/2022  Charlott Rakes, MD Primary Care Provider  08/16/2022  Allegra Lai, MD Cardiology 08/02/2022   Allergies:  Lisinopril and Tylenol [acetaminophen]  Current Home Medications:   No current facility-administered medications for this encounter.    albuterol (VENTOLIN HFA) 108 (90 Base) MCG/ACT inhaler   apixaban (ELIQUIS) 5 MG TABS tablet   atorvastatin (LIPITOR) 40 MG tablet   azaTHIOprine (IMURAN) 50 MG tablet   azelastine (ASTELIN) 0.1 % nasal spray   brimonidine (ALPHAGAN) 0.2 % ophthalmic solution   carvedilol (COREG) 25 MG tablet   cetirizine (ZYRTEC) 10 MG tablet   Difluprednate 0.05 % EMUL   diltiazem (CARDIZEM CD) 180 MG 24 hr capsule   dorzolamide-timolol (COSOPT) 22.3-6.8 MG/ML ophthalmic solution   doxycycline (VIBRA-TABS) 100 MG tablet   DULoxetine (CYMBALTA) 60 MG capsule   EPINEPHrine 0.3 mg/0.3 mL IJ SOAJ injection   fluticasone (FLONASE) 50 MCG/ACT nasal spray   fluticasone furoate-vilanterol (BREO ELLIPTA) 200-25 MCG/ACT AEPB   folic acid (FOLVITE) 1 MG tablet   GARLIC PO   glucose blood (ACCU-CHEK GUIDE) test strip   latanoprost (XALATAN) 0.005 % ophthalmic solution   metFORMIN (GLUCOPHAGE) 500 MG tablet   methocarbamol (ROBAXIN) 500 MG tablet   Misc. Devices MISC   Misc. Devices MISC   montelukast (SINGULAIR) 10 MG tablet   Olopatadine HCl (PATADAY) 0.2 % SOLN   omeprazole (PRILOSEC) 20 MG capsule   Vitamin D3 (VITAMIN D) 25 MCG tablet   History:   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)    a.) s/p embolization 08/19/2020: 29 mm RIGHT internal iliac artery aneurysm   Anxiety    Aortic atherosclerosis (HCC)    Bilateral carpal tunnel syndrome 01/10/2018   Bipolar disorder ([place])    Chorioretinal inflammation of both eyes    a.) on azothioprine   Chronic lower back pain    Coronary artery calcification seen on CT scan    a.) cCTA 04/21/2022: Ca score 20.6 (61st percentile for age/sex match control)   DDD (degenerative disc disease),  cervical    Depression    Diastolic dysfunction    a.) TTE 01/13/2021: EF 60-65%, mod LVH, triv MR, G1DD   GERD (gastroesophageal reflux disease)    Hepatic steatosis    History of alcohol abuse    History of nuclear stress test    Myoview 10/16: EF 50%, diaphragmatic attenuation, no ischemia, low risk   Hypertension    Lacunar infarction ([place]) 12/25/2012   a.) CT head 12/25/2012 --> RIGHT basal ganglia hypoattenuation related to remote lacunar infarct   Lipoma    Long term (current) use of anticoagulants    a.) apixaban   Long-term current use of immunomodulator    a.) on azothioprine for peripheral focal chorioretinal inflammation (both eyes)   Marijuana use    Migraine    Moderate persistent asthma with acute exacerbation 05/02/2018   PAF (paroxysmal atrial fibrillation) ([place]) 03/27/2015   a.) CHA2DS2VASc = 5 (HTN, CVA x2, vascular disease history, T2DM);  b.) s/p ablation 10/02/2015; c.) s/p ablation 12/10/2015; d.) s/p DCCV (200 J x 1) 12/11/2015; e.) s/p ablation 04/28/2022; f.) rate/rhythm maintained on oral diltiazem + carvedilol; chronically anticoagulated with apixaban   Pilonidal cyst    Schizophrenia ([place])    Sciatica neuralgia    T2DM (type 2 diabetes mellitus) ([place])    Thoracic aortic ectasia ([place]) 01/13/2021   a.) TTE 01/13/2021: Ao root 38 mm, asc Ao 39 mm  Past Surgical History:  Procedure Laterality Date   ATRIAL FIBRILLATION ABLATION  09/22/2015   ATRIAL FIBRILLATION ABLATION N/A 04/28/2022   Procedure: ATRIAL FIBRILLATION ABLATION;  Surgeon: Constance Haw, MD;  Location: [place] [facility];  Service: Cardiovascular;  Laterality: N/A;   CATARACT EXTRACTION Bilateral    CYST EXCISION  1996-97   surgery back of head    ELECTROPHYSIOLOGIC STUDY N/A 09/22/2015   Procedure: Atrial Fibrillation Ablation;  Surgeon: Will [name], MD;  Location: [place] [facility];  Service: Cardiovascular;  Laterality: N/A;   ELECTROPHYSIOLOGIC STUDY N/A 12/10/2015    Procedure: Atrial Fibrillation Ablation;  Surgeon: Will [name], MD;  Location: [place] [facility];  Service: Cardiovascular;  Laterality: N/A;   ELECTROPHYSIOLOGIC STUDY N/A 12/11/2015   Procedure: Cardioversion;  Surgeon: Will [name], MD;  Location: [place] [facility];  Service: Cardiovascular;  Laterality: N/A;   EMBOLIZATION Right 08/19/2020   Procedure: EMBOLIZATION;  Surgeon: Cherre Robins, MD;  Location: [place] [facility];  Service: Cardiovascular;  Laterality: Right;  hypogastric   EXCISION MASS HEAD N/A 01/06/2017   Procedure: EXCISION MASS FOREHEAD;  Surgeon: Irene Limbo, MD;  Location: [place];  Service: Plastics;  Laterality: N/A;   EXCISION MASS UPPER EXTREMETIES Right 08/08/2022   Procedure: EXCISION MASS RIGHT FOREARM;  Surgeon: Leanora Cover, MD;  Location: [place];  Service: Orthopedics;  Laterality: Right;  45 MIN   GANGLION CYST EXCISION Left    INTERCOSTAL NERVE BLOCK  07/19/2003   KNEE ARTHROSCOPY Right 07/18/2014   MASS EXCISION N/A 01/25/2021   Procedure: EXCISION SUBCUTANEOUS VS SUBFASCIAL MASS TORSO 3CM;  Surgeon: Irene Limbo, MD;  Location: [place];  Service: Plastics;  Laterality: N/A;   Family History  Problem Relation Age of Onset   Heart disease Father    Schizophrenia Sister    Social History   Tobacco Use   Smoking status: Some Days    Years: 29.00    Types: Cigarettes   Smokeless tobacco: Never   Tobacco comments:    1 cigarettes every day  06-02-2021  Vaping Use   Vaping Use: Never used  Substance Use Topics   Alcohol use: Yes    Alcohol/week: 6.0 standard drinks of alcohol    Types: 6 Cans of beer per week    Comment: 6 pack weekly 05/26/22   Drug use: Yes    Types: Marijuana    Pertinent Clinical Results:  LABS:   No visits with results within 3 Day(s) from this visit.  Latest known visit with results is:  Hospital Outpatient Visit on 09/06/2022   Component Date Value Ref Range Status   WBC 09/06/2022 5.0  4.0 - 10.5 K/uL Final   RBC 09/06/2022 5.22  4.22 - 5.81 MIL/uL Final   Hemoglobin 09/06/2022 12.0 (L)  13.0 - 17.0 g/dL Final   HCT 09/06/2022 39.1  39.0 - 52.0 % Final   MCV 09/06/2022 74.9 (L)  80.0 - 100.0 fL Final   MCH 09/06/2022 23.0 (L)  26.0 - 34.0 pg Final   MCHC 09/06/2022 30.7  30.0 - 36.0 g/dL Final   RDW 09/06/2022 15.3  11.5 - 15.5 % Final   Platelets 09/06/2022 155  150 - 400 K/uL Final   nRBC 09/06/2022 0.0  0.0 - 0.2 % Final   Performed at [facility], [address] [place], [place] [address]   Lab Results  Component Value Date   NA 140 06/23/2022  K 3.5 06/23/2022   CO2 22 06/23/2022   GLUCOSE 131 (H) 06/23/2022   BUN <5 (L) 06/23/2022   CREATININE 0.97 06/23/2022   CALCIUM 8.9 06/23/2022   EGFR 66 04/15/2022   GFRNONAA >60 06/23/2022    ECG: Date: 08/02/2022 Time ECG obtained: 1437 PM Rate: 94 bpm Rhythm: normal sinus Axis (leads I and aVF): Normal Intervals: PR 148 ms. QRS 70 ms. QTc 425 ms. ST segment and T wave changes: No evidence of acute ST segment elevation or depression Comparison: Similar to previous tracing obtained on 05/26/2022   IMAGING / PROCEDURES: CT CERVICAL SPINE WO CONTRAST performed on 07/27/2022 No acute fracture or traumatic listhesis of the cervical spine. 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  CT CARDIAC MORPH/PULM VEIN W/CM&W/O CA SCORE performed on 04/21/2022 There is normal pulmonary vein drainage into the left atrium with ostial measurements above. There is no thrombus in the left atrial appendage. The esophagus runs in the left atrial midline and is not in proximity to any of the pulmonary vein ostia. No PFO/ASD. Normal coronary origin. Right dominance. CAC score of 20.6 Agatston units which is 61st percentile for age-, race-, and sex-matched  controls. No acute findings in the examined extracardiac chest Aortic atherosclerosis  VAS [place] ABI WITH/WO TBI performed on 03/30/2021 Resting right ankle-brachial index is within normal range. No evidence of significant right lower extremity arterial disease. The right toe-brachial index is normal. Resting left ankle-brachial index is within normal range. No evidence of significant left lower extremity arterial disease. The left toe-brachial index is normal.   TRANSTHORACIC ECHOCARDIOGRAM performed on 01/13/2021 Left ventricular ejection fraction, by estimation, is 60 to 65%. The left ventricle has normal function. The left ventricle has no regional wall motion abnormalities. There is moderate left ventricular hypertrophy. Left ventricular diastolic parameters are consistent with Grade I diastolic dysfunction (impaired relaxation). Elevated left ventricular end-diastolic pressure. The E/e' is 16.  Right ventricular systolic function is normal. The right ventricular size is normal.  The mitral valve is grossly normal. Trivial mitral valve regurgitation.  The aortic valve is tricuspid. Aortic valve regurgitation is not visualized.  Aortic dilatation noted. There is borderline dilatation of the aortic root, measuring 38 mm. There is mild dilatation of the ascending aorta, measuring 39 mm.  The inferior vena cava is normal in size with greater than 50% respiratory variability, suggesting right atrial pressure of 3 mmHg.   EXERCISE TOLERANCE TEST performed on 01/26/2017 Blood pressure demonstrated a normal response to exercise There was no ST segment deviation noted during stress There were frequent PVCs, ventricular couplets, and ventricular salvos Negative study for exercise-induced ischemia  Impression and Plan:  Jason Stokes has been referred for pre-anesthesia review and clearance prior to him undergoing the planned anesthetic and procedural courses. Available labs, pertinent testing, and  imaging results were personally reviewed by me in preparation for upcoming operative/procedural course. [facility] medical record has been updated following extensive record review and patient interview with PAT staff.   This patient has been appropriately cleared by cardiology (ACCEPTABLE) and by internal/family medicine (MODERATE) with the individually indicated risk of significant perioperative complications. Based on clinical review performed today (09/09/22), barring any significant acute changes in the patient's overall condition, it is anticipated that he will be able to proceed with the planned surgical intervention. Any acute changes in clinical condition may necessitate his procedure being postponed and/or cancelled. Patient will  meet with anesthesia team (MD and/or CRNA) on the day of his procedure for preoperative evaluation/assessment. Questions regarding anesthetic course will be fielded at that time.   Pre-surgical instructions were reviewed with the patient during his PAT appointment, and questions were fielded to satisfaction by PAT clinical staff. He has been instructed on which medications that he will need to hold prior to surgery, as well as the ones that have been deemed safe/appropriate to take of the day of his procedure. As part of the general education provided by PAT, patient made aware both verbally and in writing, that he would need to abstain from the use of any illegal substances during his perioperative course.  He was advised that failure to follow the provided instructions could necessitate case cancellation or result serious perioperative complications up to and including death. Patient encouraged to contact PAT and/or his surgeon's office to discuss any questions or concerns that may arise prior to surgery; verbalized understanding.   Honor Loh, MSN, APRN, FNP-C, CEN [facility]  Peri-operative Services Nurse Practitioner Phone: [phone] Fax:  [phone] 09/09/22 1:01 PM  NOTE: This note has been prepared using Dragon dictation software. Despite my best ability to proofread, there is always the potential that unintentional transcriptional errors may still occur from this process.

## 2022-09-09 ENCOUNTER — Encounter: Payer: Self-pay | Admitting: Surgery

## 2022-09-09 DIAGNOSIS — I480 Paroxysmal atrial fibrillation: Secondary | ICD-10-CM | POA: Diagnosis not present

## 2022-09-10 DIAGNOSIS — I480 Paroxysmal atrial fibrillation: Secondary | ICD-10-CM | POA: Diagnosis not present

## 2022-09-11 DIAGNOSIS — I480 Paroxysmal atrial fibrillation: Secondary | ICD-10-CM | POA: Diagnosis not present

## 2022-09-12 DIAGNOSIS — Z79899 Other long term (current) drug therapy: Secondary | ICD-10-CM | POA: Diagnosis not present

## 2022-09-12 DIAGNOSIS — M545 Low back pain, unspecified: Secondary | ICD-10-CM | POA: Diagnosis not present

## 2022-09-12 DIAGNOSIS — I480 Paroxysmal atrial fibrillation: Secondary | ICD-10-CM | POA: Diagnosis not present

## 2022-09-12 DIAGNOSIS — M542 Cervicalgia: Secondary | ICD-10-CM | POA: Diagnosis not present

## 2022-09-12 DIAGNOSIS — G8929 Other chronic pain: Secondary | ICD-10-CM | POA: Diagnosis not present

## 2022-09-12 DIAGNOSIS — R892 Abnormal level of other drugs, medicaments and biological substances in specimens from other organs, systems and tissues: Secondary | ICD-10-CM | POA: Diagnosis not present

## 2022-09-12 MED ORDER — CEFAZOLIN SODIUM-DEXTROSE 2-4 GM/100ML-% IV SOLN
2.0000 g | INTRAVENOUS | Status: AC
  Administered 2022-09-13: 2 g via INTRAVENOUS

## 2022-09-12 MED ORDER — ORAL CARE MOUTH RINSE: 15.0000 mL | Freq: Once | OROMUCOSAL | Status: AC

## 2022-09-12 MED ORDER — CHLORHEXIDINE GLUCONATE CLOTH 2 % EX PADS: 6.0000 | MEDICATED_PAD | Freq: Once | CUTANEOUS | Status: DC

## 2022-09-12 MED ORDER — BUPIVACAINE LIPOSOME 1.3 % IJ SUSP: 20.0000 mL | Freq: Once | INTRAMUSCULAR | Status: DC

## 2022-09-12 MED ORDER — CHLORHEXIDINE GLUCONATE 0.12 % MT SOLN: 15.0000 mL | Freq: Once | OROMUCOSAL | Status: AC

## 2022-09-12 MED ORDER — SODIUM CHLORIDE 0.9 % IV SOLN: INTRAVENOUS | Status: DC

## 2022-09-12 MED ORDER — GABAPENTIN 300 MG PO CAPS: 300.0000 mg | ORAL_CAPSULE | ORAL | Status: DC

## 2022-09-13 ENCOUNTER — Encounter: Payer: Self-pay | Admitting: Surgery

## 2022-09-13 ENCOUNTER — Other Ambulatory Visit: Payer: Self-pay

## 2022-09-13 ENCOUNTER — Ambulatory Visit: Payer: Medicaid Other | Admitting: Urgent Care

## 2022-09-13 ENCOUNTER — Encounter
Admission: RE | Disposition: A | Discharge status: Home / Self Care | Payer: Self-pay | Source: Home / Self Care | Attending: Surgery

## 2022-09-13 ENCOUNTER — Ambulatory Visit
Admission: RE | Admit: 2022-09-13 | Discharge: 2022-09-13 | Disposition: A | Discharge status: Home / Self Care | Payer: Medicaid Other | Attending: Surgery | Admitting: Surgery

## 2022-09-13 DIAGNOSIS — Z7984 Long term (current) use of oral hypoglycemic drugs: Secondary | ICD-10-CM | POA: Diagnosis not present

## 2022-09-13 DIAGNOSIS — L0591 Pilonidal cyst without abscess: Secondary | ICD-10-CM

## 2022-09-13 DIAGNOSIS — Z7951 Long term (current) use of inhaled steroids: Secondary | ICD-10-CM | POA: Insufficient documentation

## 2022-09-13 DIAGNOSIS — Z79899 Other long term (current) drug therapy: Secondary | ICD-10-CM | POA: Insufficient documentation

## 2022-09-13 DIAGNOSIS — E119 Type 2 diabetes mellitus without complications: Secondary | ICD-10-CM | POA: Insufficient documentation

## 2022-09-13 DIAGNOSIS — Z7901 Long term (current) use of anticoagulants: Secondary | ICD-10-CM | POA: Diagnosis not present

## 2022-09-13 DIAGNOSIS — F319 Bipolar disorder, unspecified: Secondary | ICD-10-CM | POA: Diagnosis not present

## 2022-09-13 DIAGNOSIS — F419 Anxiety disorder, unspecified: Secondary | ICD-10-CM | POA: Insufficient documentation

## 2022-09-13 DIAGNOSIS — Z01818 Encounter for other preprocedural examination: Secondary | ICD-10-CM

## 2022-09-13 DIAGNOSIS — E669 Obesity, unspecified: Secondary | ICD-10-CM | POA: Diagnosis not present

## 2022-09-13 DIAGNOSIS — J4540 Moderate persistent asthma, uncomplicated: Secondary | ICD-10-CM | POA: Insufficient documentation

## 2022-09-13 DIAGNOSIS — I480 Paroxysmal atrial fibrillation: Secondary | ICD-10-CM | POA: Diagnosis not present

## 2022-09-13 DIAGNOSIS — K219 Gastro-esophageal reflux disease without esophagitis: Secondary | ICD-10-CM | POA: Diagnosis not present

## 2022-09-13 DIAGNOSIS — I10 Essential (primary) hypertension: Secondary | ICD-10-CM | POA: Insufficient documentation

## 2022-09-13 DIAGNOSIS — F17210 Nicotine dependence, cigarettes, uncomplicated: Secondary | ICD-10-CM | POA: Diagnosis not present

## 2022-09-13 DIAGNOSIS — Z6832 Body mass index (BMI) 32.0-32.9, adult: Secondary | ICD-10-CM | POA: Diagnosis not present

## 2022-09-13 HISTORY — DX: Fatty (change of) liver, not elsewhere classified: K76.0

## 2022-09-13 HISTORY — DX: Atherosclerosis of aorta: I70.0

## 2022-09-13 HISTORY — DX: Other ill-defined heart diseases: I51.89

## 2022-09-13 HISTORY — DX: Other cervical disc degeneration, unspecified cervical region: M50.30

## 2022-09-13 HISTORY — DX: Long term (current) use of anticoagulants: Z79.01

## 2022-09-13 HISTORY — DX: Long term (current) use of immunomodulator: Z79.61

## 2022-09-13 HISTORY — DX: Aneurysm of iliac artery: I72.3

## 2022-09-13 HISTORY — PX: PILONIDAL CYST EXCISION: SHX744

## 2022-09-13 HISTORY — DX: Unspecified chorioretinal inflammation, bilateral: H30.93

## 2022-09-13 HISTORY — DX: Atherosclerotic heart disease of native coronary artery without angina pectoris: I25.10

## 2022-09-13 HISTORY — DX: Type 2 diabetes mellitus without complications: E11.9

## 2022-09-13 LAB — GLUCOSE, CAPILLARY
Glucose-Capillary: 112 mg/dL — ABNORMAL HIGH (ref 70–99)
Glucose-Capillary: 124 mg/dL — ABNORMAL HIGH (ref 70–99)

## 2022-09-13 SURGERY — EXCISION, PILONIDAL CYST, EXTENSIVE
Anesthesia: General

## 2022-09-13 MED ORDER — BUPIVACAINE-EPINEPHRINE (PF) 0.5% -1:200000 IJ SOLN
INTRAMUSCULAR | Status: DC | PRN
  Administered 2022-09-13: 50 mL via INTRAMUSCULAR

## 2022-09-13 MED ORDER — CEFAZOLIN SODIUM-DEXTROSE 2-4 GM/100ML-% IV SOLN
INTRAVENOUS | Status: AC
  Filled 2022-09-13: qty 100

## 2022-09-13 MED ORDER — MIDAZOLAM HCL 2 MG/2ML IJ SOLN
INTRAMUSCULAR | Status: AC
  Filled 2022-09-13: qty 2

## 2022-09-13 MED ORDER — ROCURONIUM BROMIDE 10 MG/ML (PF) SYRINGE
PREFILLED_SYRINGE | INTRAVENOUS | Status: AC
  Filled 2022-09-13: qty 10

## 2022-09-13 MED ORDER — GABAPENTIN 300 MG PO CAPS
ORAL_CAPSULE | ORAL | Status: AC
  Filled 2022-09-13: qty 1

## 2022-09-13 MED ORDER — SUGAMMADEX SODIUM 200 MG/2ML IV SOLN
INTRAVENOUS | Status: DC | PRN
  Administered 2022-09-13: 200 mg via INTRAVENOUS

## 2022-09-13 MED ORDER — LIDOCAINE HCL (PF) 2 % IJ SOLN
INTRAMUSCULAR | Status: AC
  Filled 2022-09-13: qty 5

## 2022-09-13 MED ORDER — DEXAMETHASONE SODIUM PHOSPHATE 10 MG/ML IJ SOLN
INTRAMUSCULAR | Status: DC | PRN
  Administered 2022-09-13: 5 mg via INTRAVENOUS

## 2022-09-13 MED ORDER — ACETAMINOPHEN 10 MG/ML IV SOLN
INTRAVENOUS | Status: DC | PRN
  Administered 2022-09-13: 1000 mg via INTRAVENOUS

## 2022-09-13 MED ORDER — PREGABALIN 50 MG PO CAPS: 50.0000 mg | ORAL_CAPSULE | Freq: Three times a day (TID) | ORAL | 0 refills | Status: DC

## 2022-09-13 MED ORDER — MIDAZOLAM HCL 2 MG/2ML IJ SOLN
INTRAMUSCULAR | Status: DC | PRN
  Administered 2022-09-13: 2 mg via INTRAVENOUS

## 2022-09-13 MED ORDER — FENTANYL CITRATE (PF) 100 MCG/2ML IJ SOLN
INTRAMUSCULAR | Status: AC
  Filled 2022-09-13: qty 2

## 2022-09-13 MED ORDER — ACETAMINOPHEN 10 MG/ML IV SOLN: 1000.0000 mg | Freq: Once | INTRAVENOUS | Status: DC | PRN

## 2022-09-13 MED ORDER — LACTATED RINGERS IV SOLN: INTRAVENOUS | Status: DC | PRN

## 2022-09-13 MED ORDER — BUPIVACAINE HCL (PF) 0.5 % IJ SOLN
INTRAMUSCULAR | Status: AC
  Filled 2022-09-13: qty 30

## 2022-09-13 MED ORDER — OXYCODONE HCL 5 MG PO TABS
5.0000 mg | ORAL_TABLET | Freq: Once | ORAL | Status: AC | PRN
  Administered 2022-09-13: 5 mg via ORAL

## 2022-09-13 MED ORDER — 0.9 % SODIUM CHLORIDE (POUR BTL) OPTIME
TOPICAL | Status: DC | PRN
  Administered 2022-09-13: 500 mL

## 2022-09-13 MED ORDER — ONDANSETRON HCL 4 MG/2ML IJ SOLN
INTRAMUSCULAR | Status: AC
  Filled 2022-09-13: qty 2

## 2022-09-13 MED ORDER — PROPOFOL 10 MG/ML IV BOLUS
INTRAVENOUS | Status: AC
  Filled 2022-09-13: qty 40

## 2022-09-13 MED ORDER — OXYCODONE HCL 5 MG PO TABS
ORAL_TABLET | ORAL | Status: AC
  Filled 2022-09-13: qty 1

## 2022-09-13 MED ORDER — PHENYLEPHRINE HCL (PRESSORS) 10 MG/ML IV SOLN
INTRAVENOUS | Status: DC | PRN
  Administered 2022-09-13: 160 ug via INTRAVENOUS
  Administered 2022-09-13: 80 ug via INTRAVENOUS
  Administered 2022-09-13: 160 ug via INTRAVENOUS

## 2022-09-13 MED ORDER — FENTANYL CITRATE (PF) 100 MCG/2ML IJ SOLN
INTRAMUSCULAR | Status: DC | PRN
  Administered 2022-09-13 (×2): 50 ug via INTRAVENOUS

## 2022-09-13 MED ORDER — FENTANYL CITRATE (PF) 100 MCG/2ML IJ SOLN: 25.0000 ug | INTRAMUSCULAR | Status: DC | PRN

## 2022-09-13 MED ORDER — PHENYLEPHRINE 80 MCG/ML (10ML) SYRINGE FOR IV PUSH (FOR BLOOD PRESSURE SUPPORT)
PREFILLED_SYRINGE | INTRAVENOUS | Status: AC
  Filled 2022-09-13: qty 10

## 2022-09-13 MED ORDER — ONDANSETRON HCL 4 MG/2ML IJ SOLN
INTRAMUSCULAR | Status: DC | PRN
  Administered 2022-09-13: 4 mg via INTRAVENOUS

## 2022-09-13 MED ORDER — ACETAMINOPHEN 10 MG/ML IV SOLN
INTRAVENOUS | Status: AC
  Filled 2022-09-13: qty 100

## 2022-09-13 MED ORDER — CHLORHEXIDINE GLUCONATE 0.12 % MT SOLN
OROMUCOSAL | Status: AC
  Administered 2022-09-13: 15 mL via OROMUCOSAL
  Filled 2022-09-13: qty 15

## 2022-09-13 MED ORDER — DEXMEDETOMIDINE HCL IN NACL 200 MCG/50ML IV SOLN
INTRAVENOUS | Status: DC | PRN
  Administered 2022-09-13 (×2): 12 ug via INTRAVENOUS

## 2022-09-13 MED ORDER — ROCURONIUM BROMIDE 100 MG/10ML IV SOLN
INTRAVENOUS | Status: DC | PRN
  Administered 2022-09-13: 50 mg via INTRAVENOUS

## 2022-09-13 MED ORDER — EPINEPHRINE PF 1 MG/ML IJ SOLN
INTRAMUSCULAR | Status: AC
  Filled 2022-09-13: qty 1

## 2022-09-13 MED ORDER — OXYCODONE HCL 5 MG/5ML PO SOLN: 5.0000 mg | Freq: Once | ORAL | Status: AC | PRN

## 2022-09-13 MED ORDER — BUPIVACAINE LIPOSOME 1.3 % IJ SUSP
INTRAMUSCULAR | Status: AC
  Filled 2022-09-13: qty 20

## 2022-09-13 MED ORDER — OXYCODONE HCL 5 MG PO TABS: 5.0000 mg | ORAL_TABLET | ORAL | 0 refills | Status: DC | PRN

## 2022-09-13 MED ORDER — PROPOFOL 10 MG/ML IV BOLUS
INTRAVENOUS | Status: DC | PRN
  Administered 2022-09-13: 150 mg via INTRAVENOUS

## 2022-09-13 SURGICAL SUPPLY — 38 items
BLADE SURG 15 STRL LF DISP TIS (BLADE) ×1 IMPLANT
BLADE SURG 15 STRL SS (BLADE) ×1
BRIEF MESH DISP 2XL (UNDERPADS AND DIAPERS) ×1 IMPLANT
DRAPE LAPAROTOMY 100X77 ABD (DRAPES) ×1 IMPLANT
ELECT CAUTERY BLADE TIP 2.5 (TIP) ×1
ELECT REM PT RETURN 9FT ADLT (ELECTROSURGICAL) ×1
ELECTRODE CAUTERY BLDE TIP 2.5 (TIP) ×1 IMPLANT
ELECTRODE REM PT RTRN 9FT ADLT (ELECTROSURGICAL) ×1 IMPLANT
GAUZE 4X4 16PLY ~~LOC~~+RFID DBL (SPONGE) ×1 IMPLANT
GAUZE SPONGE 4X4 12PLY STRL (GAUZE/BANDAGES/DRESSINGS) ×1 IMPLANT
GLOVE SURG SYN 7.0 (GLOVE) ×1 IMPLANT
GLOVE SURG SYN 7.0 PF PI (GLOVE) ×1 IMPLANT
GLOVE SURG SYN 7.5  E (GLOVE) ×1
GLOVE SURG SYN 7.5 E (GLOVE) ×1 IMPLANT
GLOVE SURG SYN 7.5 PF PI (GLOVE) ×1 IMPLANT
GOWN STRL REUS W/ TWL LRG LVL3 (GOWN DISPOSABLE) ×2 IMPLANT
GOWN STRL REUS W/TWL LRG LVL3 (GOWN DISPOSABLE) ×2
KIT TURNOVER KIT A (KITS) ×1 IMPLANT
LABEL OR SOLS (LABEL) ×1 IMPLANT
MANIFOLD NEPTUNE II (INSTRUMENTS) ×1 IMPLANT
NEEDLE HYPO 22GX1.5 SAFETY (NEEDLE) ×1 IMPLANT
NS IRRIG 500ML POUR BTL (IV SOLUTION) ×1 IMPLANT
PACK BASIN MINOR ARMC (MISCELLANEOUS) ×1 IMPLANT
PUNCH BIOPSY 3 (MISCELLANEOUS) IMPLANT
PUNCH BIOPSY 4MM (MISCELLANEOUS)
PUNCH BIOPSY DERMAL 6MM STRL (MISCELLANEOUS) IMPLANT
PUNCH BIOPSY DISP 4 (MISCELLANEOUS) IMPLANT
SOL PREP PVP 2OZ (MISCELLANEOUS) ×1
SOLUTION PREP PVP 2OZ (MISCELLANEOUS) ×1 IMPLANT
SUT ETHILON 2 0 FS 18 (SUTURE) IMPLANT
SUT VIC AB 2-0 SH 27 (SUTURE) ×2
SUT VIC AB 2-0 SH 27XBRD (SUTURE) ×2 IMPLANT
SUT VIC AB 3-0 SH 27 (SUTURE) ×2
SUT VIC AB 3-0 SH 27X BRD (SUTURE) ×2 IMPLANT
SYR 20ML LL LF (SYRINGE) ×1 IMPLANT
SYR BULB IRRIG 60ML STRL (SYRINGE) ×1 IMPLANT
TRAP FLUID SMOKE EVACUATOR (MISCELLANEOUS) ×1 IMPLANT
WATER STERILE IRR 500ML POUR (IV SOLUTION) ×1 IMPLANT

## 2022-09-13 NOTE — Anesthesia Postprocedure Evaluation (Signed)
Anesthesia Post Note  [name]  Procedure(s) Performed: CYST EXCISION PILONIDAL EXTENSIVE  Patient location during evaluation: PACU Anesthesia Type: General Level of consciousness: awake and alert Pain management: pain level controlled Vital Signs Assessment: post-procedure vital signs reviewed and stable Respiratory status: spontaneous breathing, nonlabored ventilation and respiratory function stable Cardiovascular status: blood pressure returned to baseline and stable Postop Assessment: no apparent nausea or vomiting Anesthetic complications: no   No notable events documented.   Last Vitals:  Vitals:   09/13/22 1449 09/13/22 1503  BP:  112/85  Pulse:  67  Resp:  18  Temp: (!) 36.3 C 36.4 C  SpO2: 95% 100%    Last Pain:  Vitals:   09/13/22 1503  TempSrc: Temporal  PainSc: 0-No pain                 Iran Ouch

## 2022-09-13 NOTE — Interval H&P Note (Signed)
History and Physical Interval Note:  09/13/2022 12:38 PM  Jason Stokes  has presented today for surgery, with the diagnosis of pilonidal cyst.  The various methods of treatment have been discussed with the patient and family. After consideration of risks, benefits and other options for treatment, the patient has consented to  Procedure(s): CYST EXCISION PILONIDAL EXTENSIVE (N/A) as a surgical intervention.  The patient's history has been reviewed, patient examined, no change in status, stable for surgery.  I have reviewed the patient's chart and labs.  Questions were answered to the patient's satisfaction.     [name]

## 2022-09-13 NOTE — Op Note (Signed)
  Procedure Date:  09/13/2022  Pre-operative Diagnosis:  Pilonidal cyst  Post-operative Diagnosis: Pilonidal cyst  Procedure:  Pilonidal cyst excision  Surgeon:  Melvyn Neth, MD  Anesthesia:  General endotracheal  Estimated Blood Loss:  10 ml  Specimens:  Pilonidal cyst  Complications:  None  Indications for Procedure:  This is a 62 y.o. male with a pilonidal cyst with multiple episodes of flare-ups.  The options of surgery versus observation were reviewed with the patient and/or family. The risks of bleeding, infection, recurrence of symptoms, abscess or infection, were all discussed with the patient and he was willing to proceed.  Description of Procedure: The patient was correctly identified in the preoperative area and brought into the operating room.  The patient was placed supine with VTE prophylaxis in place.  Appropriate time-outs were performed.  Anesthesia was induced and the patient was intubated.  The patient was then placed in prone position. Appropriate antibiotics were infused.  The pilonidal area was prepped and draped in usual sterile fashion.  A 5 cm elliptical incision was made, incorporating the pilonidal cyst and pit.  Cautery was used to dissect down the subcutaneous tissues to the cyst and the cyst with skin was excised intact using cautery.  This was sent to pathology.  Then skin flaps were created using cautery.  The cavity was irrigated and hemostasis was assured.  Local anesthetic was infiltrated into the skin and subcutaneous tissue of the cavity.  The cavity was then closed in multiple layers using 2-0 Vicryl sutures, 3-0 Vicryl sutures, and 2-0 Nylon sutures for the skin.  The incision was cleaned and dressed with gauze and tape.  The patient was then placed back on supine position, emerged from anesthesia, extubated, and brought to the recovery room for further management.  The patient tolerated the procedure well and all counts were correct at the end  of the case.   Melvyn Neth, MD

## 2022-09-13 NOTE — Anesthesia Procedure Notes (Signed)
Procedure Name: Intubation Date/Time: 09/13/2022 1:05 PM  Performed by: Hilbert Odor, CRNAPre-anesthesia Checklist: Patient identified, Patient being monitored, Timeout performed, Emergency Drugs available and Suction available Patient Re-evaluated:Patient Re-evaluated prior to induction Oxygen Delivery Method: Circle system utilized Preoxygenation: Pre-oxygenation with 100% oxygen Induction Type: IV induction Ventilation: Mask ventilation without difficulty Laryngoscope Size: 3 and Glidescope Grade View: Grade I Tube type: Oral Tube size: 7.5 mm Number of attempts: 1 Airway Equipment and Method: Stylet Placement Confirmation: ETT inserted through vocal cords under direct vision, positive ETCO2 and breath sounds checked- equal and bilateral Secured at: 23 cm Tube secured with: Tape Dental Injury: Teeth and Oropharynx as per pre-operative assessment

## 2022-09-13 NOTE — Discharge Instructions (Addendum)
Discharge Instructions: 1.  Patient may shower, but do not scrub wounds heavily and dab dry only. 2.  Do not submerge wounds in pool/tub until fully healed. 3.  Do not remove sutures 4.  May apply ice packs to the wounds for comfort. 5.  Try to avoid sitting directly on the center of the buttocks, but lean to the right or left to decrease the amount of tension in the center. 6.  Dressing change:  Apply dry gauze dressing twice daily and as needed to keep the wound clean and dry.  Secure with tape. 7.  Please resume your Eliquis on 09/15/22 (Thursday). 8.  Do not drive while taking narcotics for pain control.  Prior to driving, make sure you are able to rotate right and left to look at blindspots without significant pain or discomfort. 9.  Avoid strenuous activity for the next two weeks.  Avoid activity that would increase sweating in the gluteal cleft for 2 weeks.AMBULATORY SURGERY  DISCHARGE INSTRUCTIONS   The drugs that you were given will stay in your system until tomorrow so for the next 24 hours you should not:  Drive an automobile Make any legal decisions Drink any alcoholic beverage   You may resume regular meals tomorrow.  Today it is better to start with liquids and gradually work up to solid foods.  You may eat anything you prefer, but it is better to start with liquids, then soup and crackers, and gradually work up to solid foods.   Please notify your doctor immediately if you have any unusual bleeding, trouble breathing, redness and pain at the surgery site, drainage, fever, or pain not relieved by medication.    Your post-operative visit with Dr.                                       is: Date:                        Time:    Please call to schedule your post-operative visit.  Additional Instructions:  please leave teal colored band on left wrist for 96hours:  this is numbing medicine Dr. Hampton Abbot placed in incision

## 2022-09-13 NOTE — Transfer of Care (Signed)
Immediate Anesthesia Transfer of Care Note  [name]  Procedure(s) Performed: CYST EXCISION PILONIDAL EXTENSIVE  Patient Location: PACU  Anesthesia Type:General  Level of Consciousness: alert  and patient cooperative  Airway & Oxygen Therapy: Patient Spontanous Breathing and Patient connected to face mask oxygen  Post-op Assessment: Report given to RN and Patient moving all extremities X 4  Post vital signs: Reviewed and stable  Last Vitals:  Vitals Value Taken Time  BP 110/76 09/13/22 1427  Temp    Pulse 82 09/13/22 1431  Resp 16 09/13/22 1429  SpO2 100 % 09/13/22 1431  Vitals shown include unvalidated device data.  Last Pain:  Vitals:   09/13/22 1236  TempSrc: Tympanic  PainSc: 8          Complications: No notable events documented.

## 2022-09-13 NOTE — Anesthesia Preprocedure Evaluation (Addendum)
Anesthesia Evaluation  Patient identified by MRN, date of birth, ID band Patient awake    Reviewed: Allergy & Precautions, NPO status , Patient's Chart, lab work & pertinent test results  History of Anesthesia Complications Negative for: history of anesthetic complications  Airway Mallampati: II  TM Distance: >3 FB Neck ROM: Full    Dental  (+) Dental Advisory Given, Teeth Intact, Missing   Pulmonary neg shortness of breath, asthma , neg sleep apnea, neg recent URI, Current Smoker and Patient abstained from smoking., former smoker   breath sounds clear to auscultation       Cardiovascular hypertension, Pt. on medications and Pt. on home beta blockers (-) angina (-) CAD and (-) Past MI + dysrhythmias Atrial Fibrillation  Rhythm:Regular     Neuro/Psych  Headaches PSYCHIATRIC DISORDERS Anxiety Depression Bipolar Disorder Schizophrenia   Neuromuscular disease CVA, No Residual Symptoms    GI/Hepatic Neg liver ROS,GERD  Controlled and Medicated,,  Endo/Other  diabetes, Poorly Controlled, Type 2    Renal/GU negative Renal ROS     Musculoskeletal  (+) Arthritis ,    Abdominal  (+) + obese  Peds  Hematology negative hematology ROS (+)   Anesthesia Other Findings   Reproductive/Obstetrics                             Anesthesia Physical Anesthesia Plan  ASA: 3  Anesthesia Plan: General   Post-op Pain Management: Regional block* and Toradol IV (intra-op)*   Induction: Intravenous  PONV Risk Score and Plan: 1 and Ondansetron, Dexamethasone and Midazolam  Airway Management Planned: Oral ETT  Additional Equipment: None  Intra-op Plan:   Post-operative Plan: Extubation in OR  Informed Consent: I have reviewed the patients History and Physical, chart, labs and discussed the procedure including the risks, benefits and alternatives for the proposed anesthesia with the patient or authorized  representative who has indicated his/her understanding and acceptance.     Dental advisory given  Plan Discussed with: CRNA  Anesthesia Plan Comments:        Anesthesia Quick Evaluation

## 2022-09-14 ENCOUNTER — Ambulatory Visit (INDEPENDENT_AMBULATORY_CARE_PROVIDER_SITE_OTHER): Payer: Medicaid Other

## 2022-09-14 ENCOUNTER — Ambulatory Visit: Payer: Medicaid Other

## 2022-09-14 ENCOUNTER — Encounter: Payer: Self-pay | Admitting: Surgery

## 2022-09-14 DIAGNOSIS — I480 Paroxysmal atrial fibrillation: Secondary | ICD-10-CM | POA: Diagnosis not present

## 2022-09-14 DIAGNOSIS — J309 Allergic rhinitis, unspecified: Secondary | ICD-10-CM

## 2022-09-15 ENCOUNTER — Telehealth: Payer: Self-pay | Admitting: Surgery

## 2022-09-15 DIAGNOSIS — Z79899 Other long term (current) drug therapy: Secondary | ICD-10-CM | POA: Diagnosis not present

## 2022-09-15 DIAGNOSIS — I480 Paroxysmal atrial fibrillation: Secondary | ICD-10-CM | POA: Diagnosis not present

## 2022-09-15 LAB — SURGICAL PATHOLOGY

## 2022-09-15 NOTE — Telephone Encounter (Signed)
Patient had pilonidal cyst surgery done on 09/13/22 Dr. Hampton Abbot.  Patient states that he was prescribed Pregabalin.  Patient states that this medication is now causing his heart to go out of rhythm and his legs are swelling.  Patient has Afib and concerned that this Rx is effecting his heart rhythm.  Patient wants alternative medication that will not effect his Afib.  He uses [facility] located [address] in [place].  Please call patient. Thank you.

## 2022-09-15 NOTE — Telephone Encounter (Signed)
Called patient back, he will stop the Pregabalin and just continue with the Oxycodone.  He states can't take Ibuprofen because of the Eliquis he is on and Tylenol causes him to have increased acid reflux.   Patient reminded of his post op appointment on 09/19/22

## 2022-09-15 NOTE — Therapy (Signed)
OUTPATIENT PHYSICAL THERAPY TREATMENT NOTE/Re-Auth   Patient Name: Jason Stokes MRN: [id number] DOB:Oct 20, 1960, 62 y.o., male Today's Date: 09/16/2022  PCP: Charlott Rakes, MD   REFERRING PROVIDER: Ashok Pall, MD  END OF SESSION:   PT End of Session - 09/16/22 0937     Visit Number 4    Number of Visits 13    Date for PT Re-Evaluation 10/14/22    Authorization Type MEDICAID [place] ACCESS    Authorization Time Period 09/05/22-09/18/22    Authorization - Visit Number 3    Authorization - Number of Visits 3    Progress Note Due on Visit 10    PT Start Time 0934    PT Stop Time [id number]    PT Time Calculation (min) 28 min    Activity Tolerance Patient limited by pain    Behavior During Therapy [facility] for tasks assessed/performed               Past Medical History:  Diagnosis Date   Anemia    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[place])    a.) s/p embolization 08/19/2020: 29 mm RIGHT internal iliac artery aneurysm   Anxiety    Aortic atherosclerosis (HCC)    Bilateral carpal tunnel syndrome 01/10/2018   Bipolar disorder ([place])    Chorioretinal inflammation of both eyes    a.) on azothioprine   Chronic lower back pain    Coronary artery calcification seen on CT scan    a.) cCTA 04/21/2022: Ca score 20.6 (61st percentile for age/sex match control)   DDD (degenerative disc disease), cervical    Depression    Diastolic dysfunction    a.) TTE 01/13/2021: EF 60-65%, mod LVH, triv MR, G1DD   GERD (gastroesophageal reflux disease)    Hepatic steatosis    History of alcohol abuse    History of nuclear stress test    Myoview 10/16: EF 50%, diaphragmatic attenuation, no ischemia, low risk   Hypertension    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   a.) CT head 12/25/2012 --> RIGHT basal ganglia hypoattenuation related to remote lacunar infarct   Lipoma    Long term (current) use of anticoagulants    a.) apixaban   Long-term current use of immunomodulator    a.) on  azothioprine for peripheral focal chorioretinal inflammation (both eyes)   Marijuana use    Migraine    Moderate persistent asthma with acute exacerbation 05/02/2018   PAF (paroxysmal atrial fibrillation) ([place]) 03/27/2015   a.) CHA2DS2VASc = 5 (HTN, CVA x2, vascular disease history, T2DM);  b.) s/p ablation 10/02/2015; c.) s/p ablation 12/10/2015; d.) s/p DCCV (200 J x 1) 12/11/2015; e.) s/p ablation 04/28/2022; f.) rate/rhythm maintained on oral diltiazem + carvedilol; chronically anticoagulated with apixaban   Pilonidal cyst    Schizophrenia ([place])    Sciatica neuralgia    T2DM (type 2 diabetes mellitus) ([place])    Thoracic aortic ectasia ([place]) 01/13/2021   a.) TTE 01/13/2021: Ao root 38 mm, asc Ao 39 mm   Past Surgical History:  Procedure Laterality Date   ATRIAL FIBRILLATION ABLATION  09/22/2015   ATRIAL FIBRILLATION ABLATION N/A 04/28/2022   Procedure: ATRIAL FIBRILLATION ABLATION;  Surgeon: Constance Haw, MD;  Location: [place] [facility];  Service: Cardiovascular;  Laterality: N/A;   CATARACT EXTRACTION Bilateral    CYST EXCISION  1996-97   surgery back of head    ELECTROPHYSIOLOGIC STUDY N/A 09/22/2015   Procedure: Atrial Fibrillation Ablation;  Surgeon: Will [name], MD;  Location[facility]  INVASIVE [facility] LAB;  Service: Cardiovascular;  Laterality: N/A;   ELECTROPHYSIOLOGIC STUDY N/A 12/10/2015   Procedure: Atrial Fibrillation Ablation;  Surgeon: Will [name], MD;  Location: [place] [facility];  Service: Cardiovascular;  Laterality: N/A;   ELECTROPHYSIOLOGIC STUDY N/A 12/11/2015   Procedure: Cardioversion;  Surgeon: Will [name], MD;  Location: [place] [facility];  Service: Cardiovascular;  Laterality: N/A;   EMBOLIZATION Right 08/19/2020   Procedure: EMBOLIZATION;  Surgeon: Cherre Robins, MD;  Location: [place] [facility];  Service: Cardiovascular;  Laterality: Right;  hypogastric   EXCISION MASS HEAD N/A 01/06/2017   Procedure: EXCISION MASS FOREHEAD;   Surgeon: Irene Limbo, MD;  Location: [place];  Service: Plastics;  Laterality: N/A;   EXCISION MASS UPPER EXTREMETIES Right 08/08/2022   Procedure: EXCISION MASS RIGHT FOREARM;  Surgeon: Leanora Cover, MD;  Location: [place];  Service: Orthopedics;  Laterality: Right;  45 MIN   GANGLION CYST EXCISION Left    INTERCOSTAL NERVE BLOCK  07/19/2003   KNEE ARTHROSCOPY Right 07/18/2014   MASS EXCISION N/A 01/25/2021   Procedure: EXCISION SUBCUTANEOUS VS SUBFASCIAL MASS TORSO 3CM;  Surgeon: Irene Limbo, MD;  Location: [place];  Service: Plastics;  Laterality: N/A;   PILONIDAL CYST EXCISION N/A 09/13/2022   Procedure: CYST EXCISION PILONIDAL EXTENSIVE;  Surgeon: Olean Ree, MD;  Location: [facility];  Service: General;  Laterality: N/A;   Patient Active Problem List   Diagnosis Date Noted   Pilonidal cyst 09/13/2022   Aortic atherosclerosis ([place]) 06/07/2022   Hypercoagulable state due to paroxysmal atrial fibrillation ([place]) 03/23/2021   Type 2 diabetes mellitus with hyperglycemia, without long-term current use of insulin ([place]) 02/10/2021   Neck pain on left side 03/06/2019   Musculoskeletal chest pain 07/24/2018   Asthma, mild intermittent 05/02/2018   Allergic rhinitis caused by mold 05/02/2018   Tobacco use 05/02/2018   Bilateral carpal tunnel syndrome 01/10/2018   Stroke ([place])    Schizophrenia ([place])    Migraine    History of nuclear stress test    History of alcohol abuse    GERD (gastroesophageal reflux disease)    Dysrhythmia    Chronic lower back pain    Anxiety    Arthritis of knee, right 04/12/2017   Bipolar disorder ([place]) 04/03/2017   Lipoma of forehead 09/22/2016   Trigger ring finger of right hand 06/22/2016   Paroxysmal atrial fibrillation (HCC)    AF (atrial fibrillation) ([place]) 12/10/2015   Eczema 07/10/2015   History of CVA (cerebrovascular accident) 05/04/2015   Tear of medial meniscus of right knee  03/18/2015   Chronic pain of right knee 03/12/2015   Other and unspecified hyperlipidemia 11/25/2013   Nasal congestion 11/25/2013   Sinusitis, chronic 05/09/2013   Chronic low back pain 10/12/2012   Lumbar radiculopathy 10/12/2012   Hypertension 10/12/2012   Depression 10/12/2012   Insomnia 10/12/2012    REFERRING DIAG: M47.22 (ICD-10-CM) - Other spondylosis with radiculopathy, cervical region   THERAPY DIAG:  Cervicalgia  Muscle weakness (generalized)  Abnormal posture  Cramp and spasm  Rationale for Evaluation and Treatment rehabilitation  PERTINENT HISTORY:  Migraines, bipolar disorder, hx of bilat carpal tunnel, PAF  PRECAUTIONS: None   WEIGHT BEARING RESTRICTIONS: No  SUBJECTIVE:  SUBJECTIVE STATEMENT:  Pt reports he underwent surgery fro the removal of a gluteal cyst on 09/13/22 and his neck is bothering him more from being manuvered around. Today he notes he does not feel like he can lie down comfortably after the procedure.   PAIN:  Are you having pain? Yes: NPRS scale: 9/10 Pain location: neck pain R>L Pain description: Throb, ache Aggravating factors: Turning my head  Relieving factors: Not sure, heat and cold packs don't seem to help   OBJECTIVE: (objective measures completed at initial evaluation unless otherwise dated)   DIAGNOSTIC FINDINGS:                        CT scan 07/27/22 IMPRESSION: 1. No acute fracture or traumatic listhesis of the cervical spine. 2. 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   PATIENT SURVEYS:  NDI=41, 82% complete disability   COGNITION: Overall cognitive status: Within functional limits for tasks assessed   SENSATION: WFL   POSTURE: rounded  shoulders and forward head   PALPATION: TTP of the cervical paraspinals and upper trap with increased muscle tension, L>R   CERVICAL ROM:    Active ROM A/PROM (deg) eval AROM 09/16/22  Flexion 35 pulling posterior bilat 32  Extension 40 pulling R lateral 40  Right lateral flexion 15 pulling bilat 15  Left lateral flexion 15 " needs to pop" 15  Right rotation 30 puling L lateral 30  Left rotation 40 pulling R lateral 38   (Blank rows = not tested)   UPPER EXTREMITY ROM: Passively able to lift arms to 110d bilat before report of pain as limitation Active ROM Right eval Left eval  Shoulder flexion 90 90  Shoulder extension      Shoulder abduction      Shoulder adduction      Shoulder extension      Shoulder internal rotation      Shoulder external rotation      Elbow flexion      Elbow extension      Wrist flexion      Wrist extension      Wrist ulnar deviation      Wrist radial deviation      Wrist pronation      Wrist supination       (Blank rows = not tested)   UPPER EXTREMITY MMT: Grossly 4+ to 5/5 and equal bilat. Neck weakness noted per poor posture MMT Right eval Left eval  Shoulder flexion      Shoulder extension      Shoulder abduction      Shoulder adduction      Shoulder extension      Shoulder internal rotation      Shoulder external rotation      Middle trapezius      Lower trapezius      Elbow flexion      Elbow extension      Wrist flexion      Wrist extension      Wrist ulnar deviation      Wrist radial deviation      Wrist pronation      Wrist supination      Grip strength       (Blank rows = not tested)   CERVICAL SPECIAL TESTS:  Spurling's test positive; Distraction test positive   FUNCTIONAL TESTS:  NT   [facility]AY[facility] TREATMENT:  [facility]  Adult PT Treatment:                                                DATE: 09/16/22 Therapeutic Exercise: Seated Cervical Retraction 10 reps - 3 hold Seated Scapular Retraction  10 reps - 3 hold Seated  Cervical Rotation AROM 3 reps - 15 hold Gentle Levator Scapulae Stretch 3 reps - 15 hold Supine shoulder ER 3x10 GTB Standing Row GTB 3x10 Shoulder extension GTB 3x10 Self Care: Use of heat and cold packs, and TENs unit set up for pain management Explanation of anatomy and pathophysiology for rationale of possible cause for pain and dysfunction and the purpose of PT  . PATIENT EDUCATION:  Education details: Eval findings, POC, HEP, self care  Person educated: Patient Education method: Explanation, Demonstration, Tactile cues, Verbal cues, and Handouts Education comprehension: verbalized understanding, returned demonstration, verbal cues required, and tactile cues required   HOME EXERCISE PROGRAM: Access Code: [email or web address] URL: [email or web address] Date: 09/07/2022 Prepared by: Gar Ponto  Exercises - Seated Cervical Retraction  - 6 x daily - 7 x weekly - 1 sets - 3-5 reps - 3 hold - Supine Deep Neck Flexor Training - Hold  - 1 x daily - 7 x weekly - 1 sets - 10 reps - 5 hold - Seated Scapular Retraction  - 6 x daily - 7 x weekly - 1 sets - 3-5 reps - 3 hold - Seated Cervical Rotation AROM  - 6 x daily - 7 x weekly - 1 sets - 3 reps - 15 hold - Gentle Levator Scapulae Stretch  - 1 x daily - 7 x weekly - 1 sets - 3 reps - 15 hold - Standing Row with Anchored Resistance  - 1 x daily - 7 x weekly - 3 sets - 10 reps - Shoulder extension with resistance - Neutral  - 1 x daily - 7 x weekly - 3 sets - 10 reps   ASSESSMENT:   CLINICAL IMPRESSION: Pt voiced understanding re: rationale for possible cause of pain and dysfunction and the purpose of PT. Therex was completed for cervical mobility and postural strengthening. Pt returned demonstration of the directed therex. Pt has physiological issues of his neck for which a slower course of improvement is anticipated. Recent surgical procedure limited the PT provided today. Pt's paticipation should improve with healing. Pt tolerated PT  today without adverse effects. Pt will continue to benefit from skilled PT to address impairments for improved cervical function with less pain.   OBJECTIVE IMPAIRMENTS: decreased activity tolerance, decreased ROM, decreased strength, increased muscle spasms, impaired flexibility, impaired UE functional use, postural dysfunction, obesity, and pain.    ACTIVITY LIMITATIONS: carrying, lifting, bending, sitting, sleeping, dressing, reach over head, and caring for others   PARTICIPATION LIMITATIONS: meal prep, cleaning, laundry, and driving   PERSONAL FACTORS: Age, Fitness, Past/current experiences, Time since onset of injury/illness/exacerbation, and 1 comorbidity: high BMI  are also affecting patient's functional outcome.    REHAB POTENTIAL: Good   CLINICAL DECISION MAKING: Evolving/moderate complexity   EVALUATION COMPLEXITY: Moderate     GOALS:     SHORT TERM GOALS: Target date: 09/16/22   Pt will be Ind in an initial HEP Baseline: initial Goal status: MET 09/16/22   2.  Pt will voice understanding of measures to assist in pain reduction Baseline: Initial  Goal status: MET 09/16/22,  use of heat and cold packs, TENs unit   LONG TERM GOALS: Target date: 10/14/22   Pt will be Ind in a final HEP to maintain achieved LOF  Baseline: initiated Goal status: Ongoing   2.  Pt will be able to demonstrate proper sitting posture Baseline: Poor posture Goal status: Ongoing   3.  Increase cervical side bending and and rotation for improved neck function, especially with driving Baseline: see flow sheets Goal status: Ongoing   4.  Pt will demonstrate DNF endurance of 20" with a single attempt Baseline: NT Goal status: Ongoing   5.  Pt will report neck pain of 4/10 or less for improved neck function and QOL Baseline: 8/10 Goal status: Ongoing     PLAN:   PT FREQUENCY: 2x/week   PT DURATION: 8 weeks   PLANNED INTERVENTIONS: Therapeutic exercises, Therapeutic activity, Patient/Family  education, Self Care, Dry Needling, Electrical stimulation, Spinal manipulation, Spinal mobilization, Cryotherapy, Moist heat, Taping, Traction, Ultrasound, Ionotophoresis '4mg'$ /ml Dexamethasone, Manual therapy, and Re-evaluation   PLAN FOR NEXT SESSION: Review NDI; assess response to HEP; progress therex as indicated; use of modalities, manual therapy; and TPDN as indicated.   Livie Vanderhoof MS, PT 09/16/22 4:23 PM

## 2022-09-16 ENCOUNTER — Ambulatory Visit: Payer: Medicaid Other | Attending: Neurosurgery

## 2022-09-16 DIAGNOSIS — M6281 Muscle weakness (generalized): Secondary | ICD-10-CM

## 2022-09-16 DIAGNOSIS — R252 Cramp and spasm: Secondary | ICD-10-CM | POA: Diagnosis not present

## 2022-09-16 DIAGNOSIS — R293 Abnormal posture: Secondary | ICD-10-CM | POA: Insufficient documentation

## 2022-09-16 DIAGNOSIS — M542 Cervicalgia: Secondary | ICD-10-CM | POA: Diagnosis not present

## 2022-09-16 DIAGNOSIS — I480 Paroxysmal atrial fibrillation: Secondary | ICD-10-CM | POA: Diagnosis not present

## 2022-09-17 DIAGNOSIS — I480 Paroxysmal atrial fibrillation: Secondary | ICD-10-CM | POA: Diagnosis not present

## 2022-09-18 DIAGNOSIS — I480 Paroxysmal atrial fibrillation: Secondary | ICD-10-CM | POA: Diagnosis not present

## 2022-09-19 ENCOUNTER — Ambulatory Visit (INDEPENDENT_AMBULATORY_CARE_PROVIDER_SITE_OTHER): Payer: Medicaid Other | Admitting: Surgery

## 2022-09-19 ENCOUNTER — Encounter: Payer: Self-pay | Admitting: Surgery

## 2022-09-19 VITALS — BP 131/80 | HR 73 | Temp 98.0°F | Ht 73.0 in | Wt 248.0 lb

## 2022-09-19 DIAGNOSIS — L0591 Pilonidal cyst without abscess: Secondary | ICD-10-CM

## 2022-09-19 DIAGNOSIS — I480 Paroxysmal atrial fibrillation: Secondary | ICD-10-CM | POA: Diagnosis not present

## 2022-09-19 DIAGNOSIS — Z09 Encounter for follow-up examination after completed treatment for conditions other than malignant neoplasm: Secondary | ICD-10-CM

## 2022-09-19 MED ORDER — OXYCODONE HCL 5 MG PO TABS: 5.0000 mg | ORAL_TABLET | ORAL | 0 refills | Status: DC | PRN

## 2022-09-19 NOTE — Patient Instructions (Signed)
We have removed 3 sutures today. We will have you come back next week to remove the remaining 3 sutures. Continue to keep a gauze pad over the area and change once a day.  We will refill your pain medication.    Follow up here next week.

## 2022-09-19 NOTE — Progress Notes (Signed)
09/19/2022  HPI: Jason Stokes is a 62 y.o. male s/p pilonidal cyst excision on 09/13/22.  Patient presents for follow up.  He reports that the wound has been doing well, but he's had some pain issues.  I had given him Lyrica post-op in addition to oxycodone as he is unable to take Tylenol or Ibuprofen or Gabapentin.  Unfortunately Lyrica also caused side effects and had to stop it.  The oxycodone has helped him, but he's had to take a higher dose.  Denies otherwise any wound dehiscence with the sutures.  Vital signs: BP 131/80   Pulse 73   Temp 98 F (36.7 C)   Ht '6\' 1"'$  (1.854 m)   Wt 248 lb (112.5 kg)   SpO2 97%   BMI 32.72 kg/m    Physical Exam: Constitutional:   No acute distress Skin:  Gluteal cleft incision has very superficial dehiscence, but the sutures are otherwise holding well.  Removed 3 of the 6 nylon sutures today.  Applied dry gauze dressing.  Assessment/Plan: This is a 62 y.o. male s/p pilonidal cyst excision.  --Discussed with the patient that he's otherwise healing well, with only superficial deshiscence of the incision.  I think it's still ok to remove some sutures so removed 3 of 6 today.  This will allow for some continued support to the incision as it heals.   --Will follow up with me next week to remove the remaining sutures. --Will order another course of oxycodone to help with pain control.   Melvyn Neth, [facility]

## 2022-09-20 ENCOUNTER — Ambulatory Visit: Payer: Medicaid Other

## 2022-09-20 DIAGNOSIS — R252 Cramp and spasm: Secondary | ICD-10-CM

## 2022-09-20 DIAGNOSIS — R293 Abnormal posture: Secondary | ICD-10-CM | POA: Diagnosis not present

## 2022-09-20 DIAGNOSIS — M6281 Muscle weakness (generalized): Secondary | ICD-10-CM | POA: Diagnosis not present

## 2022-09-20 DIAGNOSIS — M542 Cervicalgia: Secondary | ICD-10-CM | POA: Diagnosis not present

## 2022-09-20 NOTE — Therapy (Signed)
OUTPATIENT PHYSICAL THERAPY TREATMENT NOTE   Patient Name: Jason Stokes MRN: [id number] DOB:1961-03-13, 62 y.o., male Today's Date: 09/20/2022  PCP: Charlott Rakes, MD   REFERRING PROVIDER: Ashok Pall, MD  END OF SESSION:   PT End of Session - 09/20/22 1027     Visit Number 5    Number of Visits 16    Date for PT Re-Evaluation 10/14/22    Authorization Type MEDICAID [place] ACCESS    Authorization Time Period Approved 12 PT visits from 09/20/22-10/31/22    Authorization - Visit Number 1    Authorization - Number of Visits 12    Progress Note Due on Visit 10    PT Start Time 1021    PT Stop Time 1104    PT Time Calculation (min) 43 min    Activity Tolerance Patient limited by pain    Behavior During Therapy 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)    a.) s/p embolization 08/19/2020: 29 mm RIGHT internal iliac artery aneurysm   Anxiety    Aortic atherosclerosis (HCC)    Bilateral carpal tunnel syndrome 01/10/2018   Bipolar disorder ([place])    Chorioretinal inflammation of both eyes    a.) on azothioprine   Chronic lower back pain    Coronary artery calcification seen on CT scan    a.) cCTA 04/21/2022: Ca score 20.6 (61st percentile for age/sex match control)   DDD (degenerative disc disease), cervical    Depression    Diastolic dysfunction    a.) TTE 01/13/2021: EF 60-65%, mod LVH, triv MR, G1DD   GERD (gastroesophageal reflux disease)    Hepatic steatosis    History of alcohol abuse    History of nuclear stress test    Myoview 10/16: EF 50%, diaphragmatic attenuation, no ischemia, low risk   Hypertension    Lacunar 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   a.) CT head 12/25/2012 --> RIGHT basal ganglia hypoattenuation related to remote lacunar infarct   Lipoma    Long term (current) use of anticoagulants    a.) apixaban   Long-term current use of immunomodulator     a.) on azothioprine for peripheral focal chorioretinal inflammation (both eyes)   Marijuana use    Migraine    Moderate persistent asthma with acute exacerbation 05/02/2018   PAF (paroxysmal atrial fibrillation) ([place]) 03/27/2015   a.) CHA2DS2VASc = 5 (HTN, CVA x2, vascular disease history, T2DM);  b.) s/p ablation 10/02/2015; c.) s/p ablation 12/10/2015; d.) s/p DCCV (200 J x 1) 12/11/2015; e.) s/p ablation 04/28/2022; f.) rate/rhythm maintained on oral diltiazem + carvedilol; chronically anticoagulated with apixaban   Pilonidal cyst    Schizophrenia ([place])    Sciatica neuralgia    T2DM (type 2 diabetes mellitus) ([place])    Thoracic aortic ectasia ([place]) 01/13/2021   a.) TTE 01/13/2021: Ao root 38 mm, asc Ao 39 mm   Past Surgical History:  Procedure Laterality Date   ATRIAL FIBRILLATION ABLATION  09/22/2015   ATRIAL FIBRILLATION ABLATION N/A 04/28/2022   Procedure: ATRIAL FIBRILLATION ABLATION;  Surgeon: Constance Haw, MD;  Location: [place] [facility];  Service: Cardiovascular;  Laterality: N/A;   CATARACT EXTRACTION Bilateral    CYST EXCISION  1996-97   surgery back of head    ELECTROPHYSIOLOGIC STUDY N/A 09/22/2015   Procedure: Atrial Fibrillation Ablation;  Surgeon: Will Hassell Done  Camnitz, MD;  Location: [place] [facility];  Service: Cardiovascular;  Laterality: N/A;   ELECTROPHYSIOLOGIC STUDY N/A 12/10/2015   Procedure: Atrial Fibrillation Ablation;  Surgeon: Will [name], MD;  Location: [place] [facility];  Service: Cardiovascular;  Laterality: N/A;   ELECTROPHYSIOLOGIC STUDY N/A 12/11/2015   Procedure: Cardioversion;  Surgeon: Will [name], MD;  Location: [place] [facility];  Service: Cardiovascular;  Laterality: N/A;   EMBOLIZATION Right 08/19/2020   Procedure: EMBOLIZATION;  Surgeon: Cherre Robins, MD;  Location: [place] [facility];  Service: Cardiovascular;  Laterality: Right;  hypogastric   EXCISION MASS HEAD N/A 01/06/2017   Procedure: EXCISION MASS  FOREHEAD;  Surgeon: Irene Limbo, MD;  Location: [place];  Service: Plastics;  Laterality: N/A;   EXCISION MASS UPPER EXTREMETIES Right 08/08/2022   Procedure: EXCISION MASS RIGHT FOREARM;  Surgeon: Leanora Cover, MD;  Location: [place];  Service: Orthopedics;  Laterality: Right;  45 MIN   GANGLION CYST EXCISION Left    INTERCOSTAL NERVE BLOCK  07/19/2003   KNEE ARTHROSCOPY Right 07/18/2014   MASS EXCISION N/A 01/25/2021   Procedure: EXCISION SUBCUTANEOUS VS SUBFASCIAL MASS TORSO 3CM;  Surgeon: Irene Limbo, MD;  Location: [place];  Service: Plastics;  Laterality: N/A;   PILONIDAL CYST EXCISION N/A 09/13/2022   Procedure: CYST EXCISION PILONIDAL EXTENSIVE;  Surgeon: Olean Ree, MD;  Location: [facility];  Service: General;  Laterality: N/A;   Patient Active Problem List   Diagnosis Date Noted   Pilonidal cyst 09/13/2022   Aortic atherosclerosis ([place]) 06/07/2022   Hypercoagulable state due to paroxysmal atrial fibrillation ([place]) 03/23/2021   Type 2 diabetes mellitus with hyperglycemia, without long-term current use of insulin ([place]) 02/10/2021   Neck pain on left side 03/06/2019   Musculoskeletal chest pain 07/24/2018   Asthma, mild intermittent 05/02/2018   Allergic rhinitis caused by mold 05/02/2018   Tobacco use 05/02/2018   Bilateral carpal tunnel syndrome 01/10/2018   Stroke ([place])    Schizophrenia ([place])    Migraine    History of nuclear stress test    History of alcohol abuse    GERD (gastroesophageal reflux disease)    Dysrhythmia    Chronic lower back pain    Anxiety    Arthritis of knee, right 04/12/2017   Bipolar disorder ([place]) 04/03/2017   Lipoma of forehead 09/22/2016   Trigger ring finger of right hand 06/22/2016   Paroxysmal atrial fibrillation (HCC)    AF (atrial fibrillation) ([place]) 12/10/2015   Eczema 07/10/2015   History of CVA (cerebrovascular accident) 05/04/2015   Tear of medial meniscus of right  knee 03/18/2015   Chronic pain of right knee 03/12/2015   Other and unspecified hyperlipidemia 11/25/2013   Nasal congestion 11/25/2013   Sinusitis, chronic 05/09/2013   Chronic low back pain 10/12/2012   Lumbar radiculopathy 10/12/2012   Hypertension 10/12/2012   Depression 10/12/2012   Insomnia 10/12/2012    REFERRING DIAG: M47.22 (ICD-10-CM) - Other spondylosis with radiculopathy, cervical region   THERAPY DIAG:  Cervicalgia  Muscle weakness (generalized)  Abnormal posture  Cramp and spasm  Rationale for Evaluation and Treatment rehabilitation  PERTINENT HISTORY:  Migraines, bipolar disorder, hx of bilat carpal tunnel, PAF  PRECAUTIONS: None   WEIGHT BEARING RESTRICTIONS: No  SUBJECTIVE:  SUBJECTIVE STATEMENT:  R neck is hurting more than the L. Gluteal cyst surgery is feeling better.  PAIN:  Are you having pain? Yes: NPRS scale: 8/10. Pain location: neck pain R>L Pain description: Throb, ache Aggravating factors: Turning my head  Relieving factors: Not sure, heat and cold packs don't seem to help   OBJECTIVE: (objective measures completed at initial evaluation unless otherwise dated)   DIAGNOSTIC FINDINGS:                        CT scan 07/27/22 IMPRESSION: 1. No acute fracture or traumatic listhesis of the cervical spine. 2. 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   PATIENT SURVEYS:  NDI=41, 82% complete disability   COGNITION: Overall cognitive status: Within functional limits for tasks assessed   SENSATION: WFL   POSTURE: rounded shoulders and forward head   PALPATION: TTP of the cervical paraspinals and upper trap with increased muscle tension, L>R   CERVICAL ROM:     Active ROM A/PROM (deg) eval AROM 09/16/22  Flexion 35 pulling posterior bilat 32  Extension 40 pulling R lateral 40  Right lateral flexion 15 pulling bilat 15  Left lateral flexion 15 " needs to pop" 15  Right rotation 30 puling L lateral 30  Left rotation 40 pulling R lateral 38   (Blank rows = not tested)   UPPER EXTREMITY ROM: Passively able to lift arms to 110d bilat before report of pain as limitation Active ROM Right eval Left eval  Shoulder flexion 90 90  Shoulder extension      Shoulder abduction      Shoulder adduction      Shoulder extension      Shoulder internal rotation      Shoulder external rotation      Elbow flexion      Elbow extension      Wrist flexion      Wrist extension      Wrist ulnar deviation      Wrist radial deviation      Wrist pronation      Wrist supination       (Blank rows = not tested)   UPPER EXTREMITY MMT: Grossly 4+ to 5/5 and equal bilat. Neck weakness noted per poor posture MMT Right eval Left eval  Shoulder flexion      Shoulder extension      Shoulder abduction      Shoulder adduction      Shoulder extension      Shoulder internal rotation      Shoulder external rotation      Middle trapezius      Lower trapezius      Elbow flexion      Elbow extension      Wrist flexion      Wrist extension      Wrist ulnar deviation      Wrist radial deviation      Wrist pronation      Wrist supination      Grip strength       (Blank rows = not tested)   CERVICAL SPECIAL TESTS:  Spurling's test positive; Distraction test positive   FUNCTIONAL TESTS:  NT   TODAY'S TREATMENT:  [facility] Adult PT Treatment:  DATE: 09/20/22 Therapeutic Exercise: Supine cervical retraction x10 3"  Supine cervical lift offs x10 5" Seated Cervical Rotation AROM 3 reps - 15 hold Gentle Levator Scapulae Stretch 3 reps - 15 hold Supine shoulder ER 2x10 RTB Shoulder extension RTB 2x10 Manual Therapy: STM  and DTM to the cervical paraspinals and upper trap Cervical manual traction- pt reports good relief Skilled palapation to identify TrPs and taut muscle bands Trigger Point Dry Needling Treatment: Pre-treatment instruction: Patient instructed on dry needling rationale, procedures, and possible side effects including pain during treatment (achy,cramping feeling), bruising, drop of blood, lightheadedness, nausea, sweating. Patient Consent Given: Yes Education handout provided: Yes Muscles treated: Bilat upper trap  Needle size and number: .30x34m x 2 Electrical stimulation performed: No Parameters: N/A Treatment response/outcome: Twitch response elicited Post-treatment instructions: Patient instructed to expect possible mild to moderate muscle soreness later today and/or tomorrow. Patient instructed in methods to reduce muscle soreness and to continue prescribed HEP. If patient was dry needled over the lung field, patient was instructed on signs and symptoms of pneumothorax and, however unlikely, to see immediate medical attention should they occur. Patient was also educated on signs and symptoms of infection and to seek medical attention should they occur. Patient verbalized understanding of these instructions and education.   [facility]:                                                DATE: 09/16/22 Therapeutic Exercise: Seated Cervical Retraction 10 reps - 3 hold Seated Scapular Retraction  10 reps - 3 hold Seated Cervical Rotation AROM 3 reps - 15 hold Gentle Levator Scapulae Stretch 3 reps - 15 hold Supine shoulder ER 3x10 GTB Standing Row GTB 3x10 Shoulder extension GTB 3x10 Self Care: Use of heat and cold packs, and TENs unit set up for pain management Explanation of anatomy and pathophysiology for rationale of possible cause for pain and dysfunction and the purpose of PT  . PATIENT EDUCATION:  Education details: Eval findings, POC, HEP, self care  Person educated:  Patient Education method: Explanation, Demonstration, Tactile cues, Verbal cues, and Handouts Education comprehension: verbalized understanding, returned demonstration, verbal cues required, and tactile cues required   HOME EXERCISE PROGRAM: Access Code: [email or web address]: [email or web address] Date: 09/07/2022 Prepared by: AGar Ponto Exercises - Seated Cervical Retraction  - 6 x daily - 7 x weekly - 1 sets - 3-5 reps - 3 hold - Supine Deep Neck Flexor Training - Hold  - 1 x daily - 7 x weekly - 1 sets - 10 reps - 5 hold - Seated Scapular Retraction  - 6 x daily - 7 x weekly - 1 sets - 3-5 reps - 3 hold - Seated Cervical Rotation AROM  - 6 x daily - 7 x weekly - 1 sets - 3 reps - 15 hold - Gentle Levator Scapulae Stretch  - 1 x daily - 7 x weekly - 1 sets - 3 reps - 15 hold - Standing Row with Anchored Resistance  - 1 x daily - 7 x weekly - 3 sets - 10 reps - Shoulder extension with resistance - Neutral  - 1 x daily - 7 x weekly - 3 sets - 10 reps   ASSESSMENT:   CLINICAL IMPRESSION: PT was completed for manual therapy f/b TPDN to the upper traps. Pt reported relief with  cervical traction. After TPDN, therex was completed for postural and posterior chain strengthening. Pt continues to demonstrate shoulder weakness especially of the ERs. Questionable nerve issue vs. rotator cuff issue. CT scan indicated C5-C6 is a most pronounced area of disc degeneration. C5 and C6 innervate the shoulder ERs. Pt reports he has an appt with Dr. Christella Noa on 3/14 ro 3/15. After the PT session, pt reported a decrease in cervical pain. Pt will continue to benefit from skilled PT to address impairments for improved function.    OBJECTIVE IMPAIRMENTS: decreased activity tolerance, decreased ROM, decreased strength, increased muscle spasms, impaired flexibility, impaired UE functional use, postural dysfunction, obesity, and pain.    ACTIVITY LIMITATIONS: carrying, lifting, bending, sitting, sleeping,  dressing, reach over head, and caring for others   PARTICIPATION LIMITATIONS: meal prep, cleaning, laundry, and driving   PERSONAL FACTORS: Age, Fitness, Past/current experiences, Time since onset of injury/illness/exacerbation, and 1 comorbidity: high BMI  are also affecting patient's functional outcome.    REHAB POTENTIAL: Good   CLINICAL DECISION MAKING: Evolving/moderate complexity   EVALUATION COMPLEXITY: Moderate     GOALS:     SHORT TERM GOALS: Target date: 09/16/22   Pt will be Ind in an initial HEP Baseline: initial Goal status: MET 09/16/22   2.  Pt will voice understanding of measures to assist in pain reduction Baseline: Initial  Goal status: MET 09/16/22, use of heat and cold packs, TENs unit   LONG TERM GOALS: Target date: 10/14/22   Pt will be Ind in a final HEP to maintain achieved LOF  Baseline: initiated Goal status: Ongoing   2.  Pt will be able to demonstrate proper sitting posture Baseline: Poor posture Goal status: Ongoing   3.  Increase cervical side bending and and rotation for improved neck function, especially with driving Baseline: see flow sheets Goal status: Ongoing   4.  Pt will demonstrate DNF endurance of 20" with a single attempt Baseline: NT Goal status: Ongoing   5.  Pt will report neck pain of 4/10 or less for improved neck function and QOL Baseline: 8/10 Goal status: Ongoing     PLAN:   PT FREQUENCY: 2x/week   PT DURATION: 8 weeks   PLANNED INTERVENTIONS: Therapeutic exercises, Therapeutic activity, Patient/Family education, Self Care, Dry Needling, Electrical stimulation, Spinal manipulation, Spinal mobilization, Cryotherapy, Moist heat, Taping, Traction, Ultrasound, Ionotophoresis '4mg'$ /ml Dexamethasone, Manual therapy, and Re-evaluation   PLAN FOR NEXT SESSION: Review NDI; assess response to HEP; progress therex as indicated; use of modalities, manual therapy; and TPDN as indicated.   Sunny Aguon MS, PT 09/20/22 2:38 PM

## 2022-09-20 NOTE — Patient Instructions (Signed)

## 2022-09-21 ENCOUNTER — Other Ambulatory Visit (HOSPITAL_COMMUNITY): Payer: Self-pay

## 2022-09-21 ENCOUNTER — Telehealth: Payer: Self-pay

## 2022-09-21 NOTE — Telephone Encounter (Signed)
Patient Advocate Encounter   Received notification from [facility] that prior authorization is required for Breo Ellipta 200-25MCG/ACT aerosol powder  Submitted: n//a [facility]   Prior authorization not submitted at this time while waiting for response from md

## 2022-09-22 ENCOUNTER — Encounter: Payer: Self-pay | Admitting: Physical Therapy

## 2022-09-22 ENCOUNTER — Ambulatory Visit: Payer: Medicaid Other | Admitting: Physical Therapy

## 2022-09-22 ENCOUNTER — Ambulatory Visit (INDEPENDENT_AMBULATORY_CARE_PROVIDER_SITE_OTHER): Payer: Medicaid Other

## 2022-09-22 ENCOUNTER — Encounter: Payer: Self-pay | Admitting: Radiology

## 2022-09-22 DIAGNOSIS — J309 Allergic rhinitis, unspecified: Secondary | ICD-10-CM

## 2022-09-22 DIAGNOSIS — M6281 Muscle weakness (generalized): Secondary | ICD-10-CM | POA: Diagnosis not present

## 2022-09-22 DIAGNOSIS — M542 Cervicalgia: Secondary | ICD-10-CM

## 2022-09-22 DIAGNOSIS — I480 Paroxysmal atrial fibrillation: Secondary | ICD-10-CM | POA: Diagnosis not present

## 2022-09-22 DIAGNOSIS — R293 Abnormal posture: Secondary | ICD-10-CM | POA: Diagnosis not present

## 2022-09-22 DIAGNOSIS — R252 Cramp and spasm: Secondary | ICD-10-CM | POA: Diagnosis not present

## 2022-09-22 NOTE — Therapy (Signed)
OUTPATIENT PHYSICAL THERAPY TREATMENT NOTE   Patient Name: Jason Stokes MRN: [id number] DOB:[birth year], 62 y.o., male Today's Date: 09/22/2022  PCP: Charlott Rakes, MD   REFERRING PROVIDER: Ashok Pall, MD  END OF SESSION:   PT End of Session - 09/22/22 0935     Visit Number 6    Number of Visits 16    Date for PT Re-Evaluation 10/14/22    Authorization Type MEDICAID [id number] ACCESS    Authorization Time Period Approved 12 PT visits from 09/20/22-10/31/22    Authorization - Visit Number 2    Authorization - Number of Visits 12    PT Start Time 0933    PT Stop Time 1013    PT Time Calculation (min) 40 min               Past Medical History:  Diagnosis Date   Anemia    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[place])    a.) s/p embolization 08/19/2020: 29 mm RIGHT internal iliac artery aneurysm   Anxiety    Aortic atherosclerosis (HCC)    Bilateral carpal tunnel syndrome 01/10/2018   Bipolar disorder ([place])    Chorioretinal inflammation of both eyes    a.) on azothioprine   Chronic lower back pain    Coronary artery calcification seen on CT scan    a.) cCTA 04/21/2022: Ca score 20.6 (61st percentile for age/sex match control)   DDD (degenerative disc disease), cervical    Depression    Diastolic dysfunction    a.) TTE 01/13/2021: EF 60-65%, mod LVH, triv MR, G1DD   GERD (gastroesophageal reflux disease)    Hepatic steatosis    History of alcohol abuse    History of nuclear stress test    Myoview 10/16: EF 50%, diaphragmatic attenuation, no ischemia, low risk   Hypertension    Lacunar infarction ([place]) 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   a.) CT head 12/25/2012 --> RIGHT basal ganglia hypoattenuation related to remote lacunar infarct   Lipoma    Long term (current) use of anticoagulants    a.) apixaban   Long-term current use of immunomodulator    a.) on azothioprine for peripheral focal chorioretinal inflammation (both eyes)   Marijuana use    Migraine    Moderate persistent  asthma with acute exacerbation 05/02/2018   PAF (paroxysmal atrial fibrillation) ([place]) 03/27/2015   a.) CHA2DS2VASc = 5 (HTN, CVA x2, vascular disease history, T2DM);  b.) s/p ablation 10/02/2015; c.) s/p ablation 12/10/2015; d.) s/p DCCV (200 J x 1) 12/11/2015; e.) s/p ablation 04/28/2022; f.) rate/rhythm maintained on oral diltiazem + carvedilol; chronically anticoagulated with apixaban   Pilonidal cyst    Schizophrenia ([place])    Sciatica neuralgia    T2DM (type 2 diabetes mellitus) ([place])    Thoracic aortic ectasia ([place]) 01/13/2021   a.) TTE 01/13/2021: Ao root 38 mm, asc Ao 39 mm   Past Surgical History:  Procedure Laterality Date   ATRIAL FIBRILLATION ABLATION  09/22/2015   ATRIAL FIBRILLATION ABLATION N/A 04/28/2022   Procedure: ATRIAL FIBRILLATION ABLATION;  Surgeon: Constance Haw, MD;  Location: [facility];  Service: Cardiovascular;  Laterality: N/A;   CATARACT EXTRACTION Bilateral    CYST EXCISION  1996-97   surgery back of head    ELECTROPHYSIOLOGIC STUDY N/A 09/22/2015   Procedure: Atrial Fibrillation Ablation;  Surgeon: Will [name], MD;  Location: [facility];  Service: Cardiovascular;  Laterality: N/A;   ELECTROPHYSIOLOGIC STUDY N/A 12/10/2015   Procedure: Atrial Fibrillation Ablation;  Surgeon:  Will [name], MD;  Location: [facility];  Service: Cardiovascular;  Laterality: N/A;   ELECTROPHYSIOLOGIC STUDY N/A 12/11/2015   Procedure: Cardioversion;  Surgeon: Will [name], MD;  Location: [facility];  Service: Cardiovascular;  Laterality: N/A;   EMBOLIZATION Right 08/19/2020   Procedure: EMBOLIZATION;  Surgeon: Cherre Robins, MD;  Location: [facility];  Service: Cardiovascular;  Laterality: Right;  hypogastric   EXCISION MASS HEAD N/A 01/06/2017   Procedure: EXCISION MASS FOREHEAD;  Surgeon: Irene Limbo, MD;  Location: [facility];  Service: Plastics;  Laterality: N/A;   EXCISION MASS UPPER  EXTREMETIES Right 08/08/2022   Procedure: EXCISION MASS RIGHT FOREARM;  Surgeon: Leanora Cover, MD;  Location: [facility];  Service: Orthopedics;  Laterality: Right;  45 MIN   GANGLION CYST EXCISION Left    INTERCOSTAL NERVE BLOCK  07/19/2003   KNEE ARTHROSCOPY Right 07/18/2014   MASS EXCISION N/A 01/25/2021   Procedure: EXCISION SUBCUTANEOUS VS SUBFASCIAL MASS TORSO 3CM;  Surgeon: Irene Limbo, MD;  Location: [facility];  Service: Plastics;  Laterality: N/A;   PILONIDAL CYST EXCISION N/A 09/13/2022   Procedure: CYST EXCISION PILONIDAL EXTENSIVE;  Surgeon: Olean Ree, MD;  Location: [facility];  Service: General;  Laterality: N/A;   Patient Active Problem List   Diagnosis Date Noted   Pilonidal cyst 09/13/2022   Aortic atherosclerosis ([place]) 06/07/2022   Hypercoagulable state due to paroxysmal atrial fibrillation ([place]) 03/23/2021   Type 2 diabetes mellitus with hyperglycemia, without long-term current use of insulin ([place]) 02/10/2021   Neck pain on left side 03/06/2019   Musculoskeletal chest pain 07/24/2018   Asthma, mild intermittent 05/02/2018   Allergic rhinitis caused by mold 05/02/2018   Tobacco use 05/02/2018   Bilateral carpal tunnel syndrome 01/10/2018   Stroke ([place])    Schizophrenia ([place])    Migraine    History of nuclear stress test    History of alcohol abuse    GERD (gastroesophageal reflux disease)    Dysrhythmia    Chronic lower back pain    Anxiety    Arthritis of knee, right 04/12/2017   Bipolar disorder ([place]) 04/03/2017   Lipoma of forehead 09/22/2016   Trigger ring finger of right hand 06/22/2016   Paroxysmal atrial fibrillation (HCC)    AF (atrial fibrillation) ([place]) 12/10/2015   Eczema 07/10/2015   History of CVA (cerebrovascular accident) 05/04/2015   Tear of medial meniscus of right knee 03/18/2015   Chronic pain of right knee 03/12/2015   Other and unspecified hyperlipidemia 11/25/2013   Nasal congestion 11/25/2013    Sinusitis, chronic 05/09/2013   Chronic low back pain 10/12/2012   Lumbar radiculopathy 10/12/2012   Hypertension 10/12/2012   Depression 10/12/2012   Insomnia 10/12/2012    REFERRING DIAG: M47.22 (ICD-10-CM) - Other spondylosis with radiculopathy, cervical region   THERAPY DIAG:  Cervicalgia  Abnormal posture  Muscle weakness (generalized)  Rationale for Evaluation and Treatment rehabilitation  PERTINENT HISTORY:  Migraines, bipolar disorder, hx of bilat carpal tunnel, PAF  PRECAUTIONS: None   WEIGHT BEARING RESTRICTIONS: No  SUBJECTIVE:  SUBJECTIVE STATEMENT:  I am going to see Dr Christella Noa tomorrow. I am going to show him that I can't raise my left arm for the last 3 weeks. The dry needling helped and my pain went down to 6/10. But I turned my head fast and my pain has been aggravated.    PAIN:  Are you having pain? Yes: NPRS scale: 9/10. Pain location: neck pain R>L Pain description: Throb, ache Aggravating factors: Turning my head  Relieving factors: Not sure, heat and cold packs don't seem to help   OBJECTIVE: (objective measures completed at initial evaluation unless otherwise dated)   DIAGNOSTIC FINDINGS:                        CT scan 07/27/22 IMPRESSION: 1. No acute fracture or traumatic listhesis of the cervical spine. 2. 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   PATIENT SURVEYS:  NDI=41, 82% complete disability   COGNITION: Overall cognitive status: Within functional limits for tasks assessed   SENSATION: WFL   POSTURE: rounded shoulders and forward head   PALPATION: TTP of the cervical paraspinals and upper trap with increased muscle tension, L>R   CERVICAL ROM:    Active  ROM A/PROM (deg) eval AROM 09/16/22  Flexion 35 pulling posterior bilat 32  Extension 40 pulling R lateral 40  Right lateral flexion 15 pulling bilat 15  Left lateral flexion 15 " needs to pop" 15  Right rotation 30 puling L lateral 30  Left rotation 40 pulling R lateral 38   (Blank rows = not tested)   UPPER EXTREMITY ROM: Passively able to lift arms to 110d bilat before report of pain as limitation Active ROM Right eval Left eval  Shoulder flexion 90 90  Shoulder extension      Shoulder abduction      Shoulder adduction      Shoulder extension      Shoulder internal rotation      Shoulder external rotation      Elbow flexion      Elbow extension      Wrist flexion      Wrist extension      Wrist ulnar deviation      Wrist radial deviation      Wrist pronation      Wrist supination       (Blank rows = not tested)   UPPER EXTREMITY MMT: Grossly 4+ to 5/5 and equal bilat. Neck weakness noted per poor posture MMT Right eval Left eval  Shoulder flexion      Shoulder extension      Shoulder abduction      Shoulder adduction      Shoulder extension      Shoulder internal rotation      Shoulder external rotation      Middle trapezius      Lower trapezius      Elbow flexion      Elbow extension      Wrist flexion      Wrist extension      Wrist ulnar deviation      Wrist radial deviation      Wrist pronation      Wrist supination      Grip strength       (Blank rows = not tested)   CERVICAL SPECIAL TESTS:  Spurling's test positive; Distraction test positive   FUNCTIONAL  TESTS:  NT   TODAY'S TREATMENT:  [facility] Adult PT Treatment:                                                DATE: 09/22/22 Therapeutic Exercise: Supine: shoulder press isometric  Supine: ball under head: cervical rotation and nods Supine chest press with dowel Supine pullovers with dowel in comfortable ROM  Supine DNF 15 sec, 25 sec  Supine chin tuck with arm circles  Supine chin tuck with  horizontal abduction  Manual Therapy: Cervical manual traction  Modalities: HMP to cervical x 10 minutes    [facility] Adult PT Treatment:                                                DATE: 09/20/22 Therapeutic Exercise: Supine cervical retraction x10 3"  Supine cervical lift offs x10 5" Seated Cervical Rotation AROM 3 reps - 15 hold Gentle Levator Scapulae Stretch 3 reps - 15 hold Supine shoulder ER 2x10 RTB Shoulder extension RTB 2x10 Manual Therapy: STM and DTM to the cervical paraspinals and upper trap Cervical manual traction- pt reports good relief Skilled palapation to identify TrPs and taut muscle bands Trigger Point Dry Needling Treatment: Pre-treatment instruction: Patient instructed on dry needling rationale, procedures, and possible side effects including pain during treatment (achy,cramping feeling), bruising, drop of blood, lightheadedness, nausea, sweating. Patient Consent Given: Yes Education handout provided: Yes Muscles treated: Bilat upper trap  Needle size and number: .30x25m x 2 Electrical stimulation performed: No Parameters: N/A Treatment response/outcome: Twitch response elicited Post-treatment instructions: Patient instructed to expect possible mild to moderate muscle soreness later today and/or tomorrow. Patient instructed in methods to reduce muscle soreness and to continue prescribed HEP. If patient was dry needled over the lung field, patient was instructed on signs and symptoms of pneumothorax and, however unlikely, to see immediate medical attention should they occur. Patient was also educated on signs and symptoms of infection and to seek medical attention should they occur. Patient verbalized understanding of these instructions and education.   [facility]:                                                DATE: 09/16/22 Therapeutic Exercise: Seated Cervical Retraction 10 reps - 3 hold Seated Scapular Retraction  10 reps - 3 hold Seated Cervical  Rotation AROM 3 reps - 15 hold Gentle Levator Scapulae Stretch 3 reps - 15 hold Supine shoulder ER 3x10 GTB Standing Row GTB 3x10 Shoulder extension GTB 3x10 Self Care: Use of heat and cold packs, and TENs unit set up for pain management Explanation of anatomy and pathophysiology for rationale of possible cause for pain and dysfunction and the purpose of PT  . PATIENT EDUCATION:  Education details: Eval findings, POC, HEP, self care  Person educated: Patient Education method: Explanation, Demonstration, Tactile cues, Verbal cues, and Handouts Education comprehension: verbalized understanding, returned demonstration, verbal cues required, and tactile cues required   HOME EXERCISE PROGRAM: Access Code: [email or web address]: [email or web address] Date: 09/07/2022 Prepared by: AGar Ponto Exercises - Seated Cervical Retraction  -  6 x daily - 7 x weekly - 1 sets - 3-5 reps - 3 hold - Supine Deep Neck Flexor Training - Hold  - 1 x daily - 7 x weekly - 1 sets - 10 reps - 5 hold - Seated Scapular Retraction  - 6 x daily - 7 x weekly - 1 sets - 3-5 reps - 3 hold - Seated Cervical Rotation AROM  - 6 x daily - 7 x weekly - 1 sets - 3 reps - 15 hold - Gentle Levator Scapulae Stretch  - 1 x daily - 7 x weekly - 1 sets - 3 reps - 15 hold - Standing Row with Anchored Resistance  - 1 x daily - 7 x weekly - 3 sets - 10 reps - Shoulder extension with resistance - Neutral  - 1 x daily - 7 x weekly - 3 sets - 10 reps   ASSESSMENT:   CLINICAL IMPRESSION: Pt reports TPDN helped until he turned his head suddenly. Pain 9/10 at start of session. Performed supine postural activation. DNF held for 25 seconds today. Pt pain reduced to 6/10 after manual and HMP. He will see Dr Christella Noa tomorrow for f/u.   Pt will continue to benefit from skilled PT to address impairments for improved function.    OBJECTIVE IMPAIRMENTS: decreased activity tolerance, decreased ROM, decreased strength, increased muscle  spasms, impaired flexibility, impaired UE functional use, postural dysfunction, obesity, and pain.    ACTIVITY LIMITATIONS: carrying, lifting, bending, sitting, sleeping, dressing, reach over head, and caring for others   PARTICIPATION LIMITATIONS: meal prep, cleaning, laundry, and driving   PERSONAL FACTORS: Age, Fitness, Past/current experiences, Time since onset of injury/illness/exacerbation, and 1 comorbidity: high BMI  are also affecting patient's functional outcome.    REHAB POTENTIAL: Good   CLINICAL DECISION MAKING: Evolving/moderate complexity   EVALUATION COMPLEXITY: Moderate     GOALS:     SHORT TERM GOALS: Target date: 09/16/22   Pt will be Ind in an initial HEP Baseline: initial Goal status: MET 09/16/22   2.  Pt will voice understanding of measures to assist in pain reduction Baseline: Initial  Goal status: MET 09/16/22, use of heat and cold packs, TENs unit   LONG TERM GOALS: Target date: 10/14/22   Pt will be Ind in a final HEP to maintain achieved LOF  Baseline: initiated Goal status: Ongoing   2.  Pt will be able to demonstrate proper sitting posture Baseline: Poor posture Goal status: Ongoing   3.  Increase cervical side bending and and rotation for improved neck function, especially with driving Baseline: see flow sheets Goal status: Ongoing   4.  Pt will demonstrate DNF endurance of 20" with a single attempt Baseline: NT 09/22/22: 25 sec Goal status: MET   5.  Pt will report neck pain of 4/10 or less for improved neck function and QOL Baseline: 8/10 Goal status: Ongoing     PLAN:   PT FREQUENCY: 2x/week   PT DURATION: 8 weeks   PLANNED INTERVENTIONS: Therapeutic exercises, Therapeutic activity, Patient/Family education, Self Care, Dry Needling, Electrical stimulation, Spinal manipulation, Spinal mobilization, Cryotherapy, Moist heat, Taping, Traction, Ultrasound, Ionotophoresis '4mg'$ /ml Dexamethasone, Manual therapy, and Re-evaluation   PLAN FOR  NEXT SESSION: Review NDI; assess response to HEP; progress therex as indicated; use of modalities, manual therapy; and TPDN as indicated.   Hessie Diener, PTA 09/22/22 1:06 PM Phone: [phone] Fax: [phone]

## 2022-09-23 DIAGNOSIS — I480 Paroxysmal atrial fibrillation: Secondary | ICD-10-CM | POA: Diagnosis not present

## 2022-09-24 DIAGNOSIS — I480 Paroxysmal atrial fibrillation: Secondary | ICD-10-CM | POA: Diagnosis not present

## 2022-09-25 DIAGNOSIS — I480 Paroxysmal atrial fibrillation: Secondary | ICD-10-CM | POA: Diagnosis not present

## 2022-09-26 DIAGNOSIS — I480 Paroxysmal atrial fibrillation: Secondary | ICD-10-CM | POA: Diagnosis not present

## 2022-09-27 DIAGNOSIS — Z6833 Body mass index (BMI) 33.0-33.9, adult: Secondary | ICD-10-CM | POA: Diagnosis not present

## 2022-09-27 DIAGNOSIS — M25512 Pain in left shoulder: Secondary | ICD-10-CM | POA: Diagnosis not present

## 2022-09-27 DIAGNOSIS — M4722 Other spondylosis with radiculopathy, cervical region: Secondary | ICD-10-CM | POA: Diagnosis not present

## 2022-09-27 DIAGNOSIS — I480 Paroxysmal atrial fibrillation: Secondary | ICD-10-CM | POA: Diagnosis not present

## 2022-09-28 ENCOUNTER — Encounter: Payer: Self-pay | Admitting: Surgery

## 2022-09-28 ENCOUNTER — Ambulatory Visit (INDEPENDENT_AMBULATORY_CARE_PROVIDER_SITE_OTHER): Payer: Medicaid Other | Admitting: Surgery

## 2022-09-28 VITALS — BP 136/88 | HR 88 | Temp 98.2°F | Ht 73.0 in | Wt 246.0 lb

## 2022-09-28 DIAGNOSIS — L0591 Pilonidal cyst without abscess: Secondary | ICD-10-CM

## 2022-09-28 DIAGNOSIS — I480 Paroxysmal atrial fibrillation: Secondary | ICD-10-CM | POA: Diagnosis not present

## 2022-09-28 DIAGNOSIS — Z09 Encounter for follow-up examination after completed treatment for conditions other than malignant neoplasm: Secondary | ICD-10-CM

## 2022-09-28 NOTE — Progress Notes (Signed)
09/28/2022  HPI: EZEL KOPE is a 62 y.o. male s/p excision of pilonidal cyst on 09/13/22.  Patient presents for follow up.  Half of the sutures were removed on his last visit on 09/19/22.  He reports that he's been doing well and denies any pain or drainage from the area.  Vital signs: BP 136/88   Pulse 88   Temp 98.2 F (36.8 C)   Ht '6\' 1"'$  (1.854 m)   Wt 246 lb (111.6 kg)   SpO2 96%   BMI 32.46 kg/m    Physical Exam: Constitutional: No acute distress Skin:  Gluteal cleft incision is well healed.  Remaining three nylon sutures were removed.  One suture was almost buried in the healing skin.  Dry gauze dressing applied.  Assessment/Plan: This is a 62 y.o. male s/p pilonidal cyst excision.  --Patient is doing very well and his wound is healing very well without any dehiscence.  Last three sutures were removed.  Dry gauze dressing applied, but likely he wont need any dressings after tomorrow. --Follow up as needed.   Melvyn Neth, [facility]

## 2022-09-28 NOTE — Patient Instructions (Signed)
The skin edges are healed well. You may get a little of bleeding from where one suture came out.  You may remove this dressing and shower as usual.   Follow-up with our office as needed.  Please call and ask to speak with a nurse if you develop questions or concerns.

## 2022-09-29 ENCOUNTER — Ambulatory Visit (INDEPENDENT_AMBULATORY_CARE_PROVIDER_SITE_OTHER): Payer: Medicaid Other

## 2022-09-29 ENCOUNTER — Ambulatory Visit: Payer: Medicaid Other | Admitting: Physical Therapy

## 2022-09-29 ENCOUNTER — Encounter: Payer: Self-pay | Admitting: Physician Assistant

## 2022-09-29 ENCOUNTER — Encounter: Payer: Self-pay | Admitting: Physical Therapy

## 2022-09-29 ENCOUNTER — Ambulatory Visit (INDEPENDENT_AMBULATORY_CARE_PROVIDER_SITE_OTHER): Payer: Medicaid Other | Admitting: Physician Assistant

## 2022-09-29 DIAGNOSIS — M6281 Muscle weakness (generalized): Secondary | ICD-10-CM | POA: Diagnosis not present

## 2022-09-29 DIAGNOSIS — M542 Cervicalgia: Secondary | ICD-10-CM

## 2022-09-29 DIAGNOSIS — R252 Cramp and spasm: Secondary | ICD-10-CM | POA: Diagnosis not present

## 2022-09-29 DIAGNOSIS — J309 Allergic rhinitis, unspecified: Secondary | ICD-10-CM

## 2022-09-29 DIAGNOSIS — M1711 Unilateral primary osteoarthritis, right knee: Secondary | ICD-10-CM | POA: Diagnosis not present

## 2022-09-29 DIAGNOSIS — I480 Paroxysmal atrial fibrillation: Secondary | ICD-10-CM | POA: Diagnosis not present

## 2022-09-29 DIAGNOSIS — R293 Abnormal posture: Secondary | ICD-10-CM

## 2022-09-29 MED ORDER — LIDOCAINE HCL 1 % IJ SOLN
3.0000 mL | INTRAMUSCULAR | Status: AC | PRN
  Administered 2022-09-29: 3 mL

## 2022-09-29 NOTE — Progress Notes (Signed)
   Procedure Note  [name]             Date of Birth: [birth year]           MRN: [id number]             Visit Date: 09/29/2022 HPI: Jason Stokes comes in today for right knee swelling pain.  Last had cortisone injection on 07/21/2022.  He states he has had no injury.  He is wanting the knee drained today.  He understands it is too early for cortisone injection.  Physical exam: General well-developed well-nourished male no acute distress mood and affect appropriate.  Psych alert and oriented x 3 Right knee: No abnormal warmth erythema.  Good range of motion.  Slight effusion. Procedures: Visit Diagnoses:  1. Unilateral primary osteoarthritis, right knee     Large Joint Inj: R knee on 09/29/2022 11:12 AM Indications: pain Details: 22 G 1.5 in needle, anterolateral approach  Arthrogram: No  Medications: 3 mL lidocaine 1 % Aspirate: 60 mL Outcome: tolerated well, no immediate complications Procedure, treatment alternatives, risks and benefits explained, specific risks discussed. Consent was given by the patient. Immediately prior to procedure a time out was called to verify the correct patient, procedure, equipment, support staff and site/side marked as required. Patient was prepped and draped in the usual sterile fashion.    Plan he knows to wait at least until March to have repeat cortisone injection.  Knee was wrapped with an Ace bandage which she will remove before returning to bed this evening.

## 2022-09-29 NOTE — Therapy (Signed)
OUTPATIENT PHYSICAL THERAPY TREATMENT NOTE   Patient Name: Jason Stokes MRN: [id number] DOB:[birth year], 62 y.o., male Today's Date: 09/29/2022  PCP: Charlott Rakes, MD   REFERRING PROVIDER: Ashok Pall, MD  END OF SESSION:   PT End of Session - 09/29/22 0933     Visit Number 7    Number of Visits 16    Date for PT Re-Evaluation 10/14/22    Authorization Type MEDICAID [place] ACCESS    Authorization Time Period Approved 12 PT visits from 09/20/22-10/31/22    Authorization - Visit Number 3    Authorization - Number of Visits 12    PT Start Time 0930    PT Stop Time 1025    PT Time Calculation (min) 55 min               Past Medical History:  Diagnosis Date   Anemia    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[place])    a.) s/p embolization 08/19/2020: 29 mm RIGHT internal iliac artery aneurysm   Anxiety    Aortic atherosclerosis (HCC)    Bilateral carpal tunnel syndrome 01/10/2018   Bipolar disorder ([place])    Chorioretinal inflammation of both eyes    a.) on azothioprine   Chronic lower back pain    Coronary artery calcification seen on CT scan    a.) cCTA 04/21/2022: Ca score 20.6 (61st percentile for age/sex match control)   DDD (degenerative disc disease), cervical    Depression    Diastolic dysfunction    a.) TTE 01/13/2021: EF 60-65%, mod LVH, triv MR, G1DD   GERD (gastroesophageal reflux disease)    Hepatic steatosis    History of alcohol abuse    History of nuclear stress test    Myoview 10/16: EF 50%, diaphragmatic attenuation, no ischemia, low risk   Hypertension    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   a.) CT head 12/25/2012 --> RIGHT basal ganglia hypoattenuation related to remote lacunar infarct   Lipoma    Long term (current) use of anticoagulants    a.) apixaban   Long-term current use of immunomodulator    a.) on azothioprine for peripheral focal chorioretinal inflammation (both eyes)   Marijuana use    Migraine    Moderate persistent  asthma with acute exacerbation 05/02/2018   PAF (paroxysmal atrial fibrillation) ([place]) 03/27/2015   a.) CHA2DS2VASc = 5 (HTN, CVA x2, vascular disease history, T2DM);  b.) s/p ablation 10/02/2015; c.) s/p ablation 12/10/2015; d.) s/p DCCV (200 J x 1) 12/11/2015; e.) s/p ablation 04/28/2022; f.) rate/rhythm maintained on oral diltiazem + carvedilol; chronically anticoagulated with apixaban   Pilonidal cyst    Schizophrenia ([place])    Sciatica neuralgia    T2DM (type 2 diabetes mellitus) ([place])    Thoracic aortic ectasia ([place]) 01/13/2021   a.) TTE 01/13/2021: Ao root 38 mm, asc Ao 39 mm   Past Surgical History:  Procedure Laterality Date   ATRIAL FIBRILLATION ABLATION  09/22/2015   ATRIAL FIBRILLATION ABLATION N/A 04/28/2022   Procedure: ATRIAL FIBRILLATION ABLATION;  Surgeon: Constance Haw, MD;  Location: [facility];  Service: Cardiovascular;  Laterality: N/A;   CATARACT EXTRACTION Bilateral    CYST EXCISION  1996-97   surgery back of head    ELECTROPHYSIOLOGIC STUDY N/A 09/22/2015   Procedure: Atrial Fibrillation Ablation;  Surgeon: Will [name], MD;  Location: [facility];  Service: Cardiovascular;  Laterality: N/A;   ELECTROPHYSIOLOGIC STUDY N/A 12/10/2015   Procedure: Atrial Fibrillation Ablation;  Surgeon:  Will [name], MD;  Location: [facility];  Service: Cardiovascular;  Laterality: N/A;   ELECTROPHYSIOLOGIC STUDY N/A 12/11/2015   Procedure: Cardioversion;  Surgeon: Will [name], MD;  Location: [facility];  Service: Cardiovascular;  Laterality: N/A;   EMBOLIZATION Right 08/19/2020   Procedure: EMBOLIZATION;  Surgeon: Cherre Robins, MD;  Location: [facility];  Service: Cardiovascular;  Laterality: Right;  hypogastric   EXCISION MASS HEAD N/A 01/06/2017   Procedure: EXCISION MASS FOREHEAD;  Surgeon: Irene Limbo, MD;  Location: [facility];  Service: Plastics;  Laterality: N/A;   EXCISION MASS UPPER  EXTREMETIES Right 08/08/2022   Procedure: EXCISION MASS RIGHT FOREARM;  Surgeon: Leanora Cover, MD;  Location: [facility];  Service: Orthopedics;  Laterality: Right;  45 MIN   GANGLION CYST EXCISION Left    INTERCOSTAL NERVE BLOCK  07/19/2003   KNEE ARTHROSCOPY Right 07/18/2014   MASS EXCISION N/A 01/25/2021   Procedure: EXCISION SUBCUTANEOUS VS SUBFASCIAL MASS TORSO 3CM;  Surgeon: Irene Limbo, MD;  Location: [facility];  Service: Plastics;  Laterality: N/A;   PILONIDAL CYST EXCISION N/A 09/13/2022   Procedure: CYST EXCISION PILONIDAL EXTENSIVE;  Surgeon: Olean Ree, MD;  Location: [facility];  Service: General;  Laterality: N/A;   Patient Active Problem List   Diagnosis Date Noted   Pilonidal cyst 09/13/2022   Aortic atherosclerosis ([place]) 06/07/2022   Hypercoagulable state due to paroxysmal atrial fibrillation ([place]) 03/23/2021   Type 2 diabetes mellitus with hyperglycemia, without long-term current use of insulin ([place]) 02/10/2021   Neck pain on left side 03/06/2019   Musculoskeletal chest pain 07/24/2018   Asthma, mild intermittent 05/02/2018   Allergic rhinitis caused by mold 05/02/2018   Tobacco use 05/02/2018   Bilateral carpal tunnel syndrome 01/10/2018   Stroke ([place])    Schizophrenia ([place])    Migraine    History of nuclear stress test    History of alcohol abuse    GERD (gastroesophageal reflux disease)    Dysrhythmia    Chronic lower back pain    Anxiety    Arthritis of knee, right 04/12/2017   Bipolar disorder ([place]) 04/03/2017   Lipoma of forehead 09/22/2016   Trigger ring finger of right hand 06/22/2016   Paroxysmal atrial fibrillation (HCC)    AF (atrial fibrillation) ([place]) 12/10/2015   Eczema 07/10/2015   History of CVA (cerebrovascular accident) 05/04/2015   Tear of medial meniscus of right knee 03/18/2015   Chronic pain of right knee 03/12/2015   Other and unspecified hyperlipidemia 11/25/2013   Nasal congestion 11/25/2013    Sinusitis, chronic 05/09/2013   Chronic low back pain 10/12/2012   Lumbar radiculopathy 10/12/2012   Hypertension 10/12/2012   Depression 10/12/2012   Insomnia 10/12/2012    REFERRING DIAG: M47.22 (ICD-10-CM) - Other spondylosis with radiculopathy, cervical region   THERAPY DIAG:  Cervicalgia  Abnormal posture  Muscle weakness (generalized)  Rationale for Evaluation and Treatment rehabilitation  PERTINENT HISTORY:  Migraines, bipolar disorder, hx of bilat carpal tunnel, PAF  PRECAUTIONS: None   WEIGHT BEARING RESTRICTIONS: No  SUBJECTIVE:  SUBJECTIVE STATEMENT: Dr. 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 Going to have a CT scan on shoulder. The neck is 9/10.     PAIN:  Are you having pain? Yes: NPRS scale: 9/10 Pain location: neck pain R>L Pain description: Throb, ache Aggravating factors: Turning my head  Relieving factors: Not sure, heat and cold packs don't seem to help   OBJECTIVE: (objective measures completed at initial evaluation unless otherwise dated)   DIAGNOSTIC FINDINGS:                        CT scan 07/27/22 IMPRESSION: 1. No acute fracture or traumatic listhesis of the cervical spine. 2. 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   PATIENT SURVEYS:  NDI=41, 82% complete disability   COGNITION: Overall cognitive status: Within functional limits for tasks assessed   SENSATION: WFL   POSTURE: rounded shoulders and forward head   PALPATION: TTP of the cervical paraspinals and upper trap with increased muscle tension, L>R   CERVICAL ROM:    Active ROM A/PROM (deg) eval AROM 09/16/22  Flexion 35 pulling posterior bilat 32  Extension 40 pulling R lateral 40  Right lateral  flexion 15 pulling bilat 15  Left lateral flexion 15 " needs to pop" 15  Right rotation 30 puling L lateral 30  Left rotation 40 pulling R lateral 38   (Blank rows = not tested)   UPPER EXTREMITY ROM: Passively able to lift arms to 110d bilat before report of pain as limitation Active ROM Right eval Left eval PROM LEFT 09/29/22  Shoulder flexion 90 90 145  Shoulder extension       Shoulder abduction     145  Shoulder adduction       Shoulder extension       Shoulder internal rotation     50  Shoulder external rotation     55  Elbow flexion       Elbow extension       Wrist flexion       Wrist extension       Wrist ulnar deviation       Wrist radial deviation       Wrist pronation       Wrist supination        (Blank rows = not tested)   UPPER EXTREMITY MMT: Grossly 4+ to 5/5 and equal bilat. Neck weakness noted per poor posture MMT Right eval Left eval  Shoulder flexion      Shoulder extension      Shoulder abduction      Shoulder adduction      Shoulder extension      Shoulder internal rotation      Shoulder external rotation      Middle trapezius      Lower trapezius      Elbow flexion      Elbow extension      Wrist flexion      Wrist extension      Wrist ulnar deviation      Wrist radial deviation      Wrist pronation      Wrist supination      Grip strength       (Blank rows = not tested)   CERVICAL SPECIAL TESTS:  Spurling's test positive; Distraction test positive   FUNCTIONAL TESTS:  NT   TODAY'S TREATMENT:  [facility] Adult PT Treatment:  DATE: 09/29/22 Therapeutic Exercise: Seated upper trap and levator stretches Seated chin tuck Supine clasped hand pullovers x 5 for self stretch  Manual Therapy: STW to bilateral upper trap and persicap in prone Modalties: MMP Cervical x 15 minutes Trigger Point Dry Needling Treatment:Performed by Gar Ponto, PT Pre-treatment instruction: Patient instructed on  dry needling rationale, procedures, and possible side effects including pain during treatment (achy,cramping feeling), bruising, drop of blood, lightheadedness, nausea, sweating. Patient Consent Given: Yes Education handout provided: Yes Muscles treated: Bilat upper trap  Needle size and number: .30x9m x 2 Electrical stimulation performed: No Parameters: N/A Treatment response/outcome: Twitch response elicited Post-treatment instructions: Patient instructed to expect possible mild to moderate muscle soreness later today and/or tomorrow. Patient instructed in methods to reduce muscle soreness and to continue prescribed HEP. If patient was dry needled over the lung field, patient was instructed on signs and symptoms of pneumothorax and, however unlikely, to see immediate medical attention should they occur. Patient was also educated on signs and symptoms of infection and to seek medical attention should they occur. Patient verbalized understanding of these instructions and education.     [facility]                                                [facility]: [facility] Therapeutic Exercise: Supine: shoulder press isometric  Supine: ball under head: cervical rotation and nods Supine chest press with dowel Supine pullovers with dowel in comfortable ROM  Supine DNF 15 sec, 25 sec  Supine chin tuck with arm circles  Supine chin tuck with horizontal abduction  Manual Therapy: Cervical manual traction  Modalities: HMP to cervical x 10 minutes    [facility] Adult PT Treatment:                                                DATE: 09/20/22 Therapeutic Exercise: Supine cervical retraction x10 3"  Supine cervical lift offs x10 5" Seated Cervical Rotation AROM 3 reps - 15 hold Gentle Levator Scapulae Stretch 3 reps - 15 hold Supine shoulder ER 2x10 RTB Shoulder extension RTB 2x10 Manual Therapy: STM and DTM to the cervical paraspinals and upper trap Cervical manual traction- pt reports good  relief Skilled palapation to identify TrPs and taut muscle bands Trigger Point Dry Needling Treatment: Pre-treatment instruction: Patient instructed on dry needling rationale, procedures, and possible side effects including pain during treatment (achy,cramping feeling), bruising, drop of blood, lightheadedness, nausea, sweating. Patient Consent Given: Yes Education handout provided: Yes Muscles treated: Bilat upper trap  Needle size and number: .30x576mx 2 Electrical stimulation performed: No Parameters: N/A Treatment response/outcome: Twitch response elicited Post-treatment instructions: Patient instructed to expect possible mild to moderate muscle soreness later today and/or tomorrow. Patient instructed in methods to reduce muscle soreness and to continue prescribed HEP. If patient was dry needled over the lung field, patient was instructed on signs and symptoms of pneumothorax and, however unlikely, to see immediate medical attention should they occur. Patient was also educated on signs and symptoms of infection and to seek medical attention should they occur. Patient verbalized understanding of these instructions and education.   [facility]  [facility]: [facility]22 Therapeutic Exercise: Seated Cervical Retraction 10 reps - 3 hold Seated Scapular Retraction  10 reps - 3 hold Seated Cervical Rotation AROM 3 reps - 15 hold Gentle Levator Scapulae Stretch 3 reps - 15 hold Supine shoulder ER 3x10 GTB Standing Row GTB 3x10 Shoulder extension GTB 3x10 Self Care: Use of heat and cold packs, and TENs unit set up for pain management Explanation of anatomy and pathophysiology for rationale of possible cause for pain and dysfunction and the purpose of PT  . PATIENT EDUCATION:  Education details: Eval findings, POC, HEP, self care  Person educated: Patient Education method: Explanation, Demonstration, Tactile cues, Verbal cues, and  Handouts Education comprehension: verbalized understanding, returned demonstration, verbal cues required, and tactile cues required   HOME EXERCISE PROGRAM: Access Code: [email or web address] URL: [email or web address] Date: 09/07/2022 Prepared by: Gar Ponto  Exercises - Seated Cervical Retraction  - 6 x daily - 7 x weekly - 1 sets - 3-5 reps - 3 hold - Supine Deep Neck Flexor Training - Hold  - 1 x daily - 7 x weekly - 1 sets - 10 reps - 5 hold - Seated Scapular Retraction  - 6 x daily - 7 x weekly - 1 sets - 3-5 reps - 3 hold - Seated Cervical Rotation AROM  - 6 x daily - 7 x weekly - 1 sets - 3 reps - 15 hold - Gentle Levator Scapulae Stretch  - 1 x daily - 7 x weekly - 1 sets - 3 reps - 15 hold - Standing Row with Anchored Resistance  - 1 x daily - 7 x weekly - 3 sets - 10 reps - Shoulder extension with resistance - Neutral  - 1 x daily - 7 x weekly - 3 sets - 10 reps   ASSESSMENT:   CLINICAL IMPRESSION: Pt reports 9/10 cervical pain and difficulty lifting left arm. He saw Neuro surgeon who suggested he has frozen shoulder. He will have a CT scan to diagnose. Left  Shoulder PROM measurements reach 145 flexion and abduction as well as 55 for external rotation. Pt reports positive response from TPDN previously, and would like to have this treatment again. Allen Ralls, PT was available and performed TPDN to bilteral upper trap. Pt reported decreased tension afterward. Continued with manual STW to further reduce muscle tension post needling.      Pain 9/10 at start of session. Performed supine postural activation. DNF held for 25 seconds today. Pt pain reduced to 6/10 after manual and HMP. He will see Dr Christella Noa tomorrow for f/u.   Pt will continue to benefit from skilled PT to address impairments for improved function.    OBJECTIVE IMPAIRMENTS: decreased activity tolerance, decreased ROM, decreased strength, increased muscle spasms, impaired flexibility, impaired UE functional use,  postural dysfunction, obesity, and pain.    ACTIVITY LIMITATIONS: carrying, lifting, bending, sitting, sleeping, dressing, reach over head, and caring for others   PARTICIPATION LIMITATIONS: meal prep, cleaning, laundry, and driving   PERSONAL FACTORS: Age, Fitness, Past/current experiences, Time since onset of injury/illness/exacerbation, and 1 comorbidity: high BMI  are also affecting patient's functional outcome.    REHAB POTENTIAL: Good   CLINICAL DECISION MAKING: Evolving/moderate complexity   EVALUATION COMPLEXITY: Moderate     GOALS:     SHORT TERM GOALS: Target date: 09/16/22   Pt will be Ind in an initial HEP Baseline: initial Goal status: MET 09/16/22   2.  Pt will voice understanding of measures to assist in pain reduction Baseline: Initial  Goal status: MET 09/16/22, use of heat and cold packs, TENs unit   LONG TERM GOALS: Target date: 10/14/22   Pt will be Ind in a final HEP to maintain achieved LOF  Baseline: initiated Goal status: Ongoing   2.  Pt will be able to demonstrate proper sitting posture Baseline: Poor posture Goal status: Ongoing   3.  Increase cervical side bending and and rotation for improved neck function, especially with driving Baseline: see flow sheets Goal status: Ongoing   4.  Pt will demonstrate DNF endurance of 20" with a single attempt Baseline: NT 09/22/22: 25 sec Goal status: MET   5.  Pt will report neck pain of 4/10 or less for improved neck function and QOL Baseline: 8/10 Goal status: Ongoing     PLAN:   PT FREQUENCY: 2x/week   PT DURATION: 8 weeks   PLANNED INTERVENTIONS: Therapeutic exercises, Therapeutic activity, Patient/Family education, Self Care, Dry Needling, Electrical stimulation, Spinal manipulation, Spinal mobilization, Cryotherapy, Moist heat, Taping, Traction, Ultrasound, Ionotophoresis '4mg'$ /ml Dexamethasone, Manual therapy, and Re-evaluation   PLAN FOR NEXT SESSION: Review NDI; assess response to HEP; progress  therex as indicated; use of modalities, manual therapy; and TPDN as indicated.   Hessie Diener, PTA 09/29/22 11:22 AM Phone: [phone] Fax: [phone]

## 2022-09-30 ENCOUNTER — Other Ambulatory Visit: Payer: Self-pay | Admitting: Neurosurgery

## 2022-09-30 DIAGNOSIS — H30033 Focal chorioretinal inflammation, peripheral, bilateral: Secondary | ICD-10-CM | POA: Diagnosis not present

## 2022-09-30 DIAGNOSIS — H4043X2 Glaucoma secondary to eye inflammation, bilateral, moderate stage: Secondary | ICD-10-CM | POA: Diagnosis not present

## 2022-09-30 DIAGNOSIS — Z79899 Other long term (current) drug therapy: Secondary | ICD-10-CM | POA: Diagnosis not present

## 2022-09-30 DIAGNOSIS — M25512 Pain in left shoulder: Secondary | ICD-10-CM

## 2022-09-30 DIAGNOSIS — H3581 Retinal edema: Secondary | ICD-10-CM | POA: Diagnosis not present

## 2022-09-30 DIAGNOSIS — Z961 Presence of intraocular lens: Secondary | ICD-10-CM | POA: Diagnosis not present

## 2022-09-30 DIAGNOSIS — I480 Paroxysmal atrial fibrillation: Secondary | ICD-10-CM | POA: Diagnosis not present

## 2022-10-01 DIAGNOSIS — I480 Paroxysmal atrial fibrillation: Secondary | ICD-10-CM | POA: Diagnosis not present

## 2022-10-02 DIAGNOSIS — I480 Paroxysmal atrial fibrillation: Secondary | ICD-10-CM | POA: Diagnosis not present

## 2022-10-03 DIAGNOSIS — I480 Paroxysmal atrial fibrillation: Secondary | ICD-10-CM | POA: Diagnosis not present

## 2022-10-04 ENCOUNTER — Ambulatory Visit (INDEPENDENT_AMBULATORY_CARE_PROVIDER_SITE_OTHER): Payer: Medicaid Other

## 2022-10-04 ENCOUNTER — Encounter: Payer: Self-pay | Admitting: Physical Therapy

## 2022-10-04 ENCOUNTER — Ambulatory Visit: Payer: Medicaid Other | Admitting: Physical Therapy

## 2022-10-04 DIAGNOSIS — M542 Cervicalgia: Secondary | ICD-10-CM | POA: Diagnosis not present

## 2022-10-04 DIAGNOSIS — J309 Allergic rhinitis, unspecified: Secondary | ICD-10-CM | POA: Diagnosis not present

## 2022-10-04 DIAGNOSIS — I480 Paroxysmal atrial fibrillation: Secondary | ICD-10-CM | POA: Diagnosis not present

## 2022-10-04 DIAGNOSIS — R293 Abnormal posture: Secondary | ICD-10-CM

## 2022-10-04 DIAGNOSIS — M6281 Muscle weakness (generalized): Secondary | ICD-10-CM

## 2022-10-04 DIAGNOSIS — R252 Cramp and spasm: Secondary | ICD-10-CM | POA: Diagnosis not present

## 2022-10-04 NOTE — Therapy (Signed)
OUTPATIENT PHYSICAL THERAPY TREATMENT NOTE   Patient Name: Jason Stokes MRN: [id number] DOB:July 02, 1961, 62 y.o., male Today's Date: 10/04/2022  PCP: Charlott Rakes, MD   REFERRING PROVIDER: Ashok Pall, MD  END OF SESSION:   PT End of Session - 10/04/22 0931     Visit Number 8    Number of Visits 16    Date for PT Re-Evaluation 10/14/22    Authorization Type MEDICAID [place] ACCESS    Authorization Time Period Approved 12 PT visits from 09/20/22-10/31/22    Authorization - Visit Number 4    Authorization - Number of Visits 12    Progress Note Due on Visit 10    PT Start Time 0930    PT Stop Time 1008    PT Time Calculation (min) 38 min               Past Medical History:  Diagnosis Date   Anemia    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[place])    a.) s/p embolization 08/19/2020: 29 mm RIGHT internal iliac artery aneurysm   Anxiety    Aortic atherosclerosis (H[facility])    Bilateral carpal tunnel syndrome 01/10/2018   Bipolar disorder ([place])    Chorioretinal inflammation of both eyes    a.) on azothioprine   Chronic lower back pain    Coronary artery calcification seen on CT scan    a.) cCTA 04/21/2022: Ca score 20.6 (61st percentile for age/sex match control)   DDD (degenerative disc disease), cervical    Depression    Diastolic dysfunction    a.) TTE 01/13/2021: EF 60-65%, mod LVH, triv MR, G1DD   GERD (gastroesophageal reflux disease)    Hepatic steatosis    History of alcohol abuse    History of nuclear stress test    Myoview 10/16: EF 50%, diaphragmatic attenuation, no ischemia, low risk   Hypertension    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   a.) CT head 12/25/2012 --> RIGHT basal ganglia hypoattenuation related to remote lacunar infarct   Lipoma    Long term (current) use of anticoagulants    a.) apixaban   Long-term current use of immunomodulator    a.) on azothioprine for peripheral focal chorioretinal inflammation (both eyes)   Marijuana use     Migraine    Moderate persistent asthma with acute exacerbation 05/02/2018   PAF (paroxysmal atrial fibrillation) ([place]) 03/27/2015   a.) CHA2DS2VASc = 5 (HTN, CVA x2, vascular disease history, T2DM);  b.) s/p ablation 10/02/2015; c.) s/p ablation 12/10/2015; d.) s/p DCCV (200 J x 1) 12/11/2015; e.) s/p ablation 04/28/2022; f.) rate/rhythm maintained on oral diltiazem + carvedilol; chronically anticoagulated with apixaban   Pilonidal cyst    Schizophrenia ([place])    Sciatica neuralgia    T2DM (type 2 diabetes mellitus) ([place])    Thoracic aortic ectasia ([place]) 01/13/2021   a.) TTE 01/13/2021: Ao root 38 mm, asc Ao 39 mm   Past Surgical History:  Procedure Laterality Date   ATRIAL FIBRILLATION ABLATION  09/22/2015   ATRIAL FIBRILLATION ABLATION N/A 04/28/2022   Procedure: ATRIAL FIBRILLATION ABLATION;  Surgeon: Constance Haw, MD;  Location: [facility];  Service: Cardiovascular;  Laterality: N/A;   CATARACT EXTRACTION Bilateral    CYST EXCISION  1996-97   surgery back of head    ELECTROPHYSIOLOGIC STUDY N/A 09/22/2015   Procedure: Atrial Fibrillation Ablation;  Surgeon: Will [name], MD;  Location: [facility];  Service: Cardiovascular;  Laterality: N/A;   ELECTROPHYSIOLOGIC STUDY N/A  12/10/2015   Procedure: Atrial Fibrillation Ablation;  Surgeon: Will [name], MD;  Location: [facility];  Service: Cardiovascular;  Laterality: N/A;   ELECTROPHYSIOLOGIC STUDY N/A 12/11/2015   Procedure: Cardioversion;  Surgeon: Will [name], MD;  Location: [facility];  Service: Cardiovascular;  Laterality: N/A;   EMBOLIZATION Right 08/19/2020   Procedure: EMBOLIZATION;  Surgeon: Cherre Robins, MD;  Location: [facility];  Service: Cardiovascular;  Laterality: Right;  hypogastric   EXCISION MASS HEAD N/A 01/06/2017   Procedure: EXCISION MASS FOREHEAD;  Surgeon: Irene Limbo, MD;  Location: [facility];  Service: Plastics;   Laterality: N/A;   EXCISION MASS UPPER EXTREMETIES Right 08/08/2022   Procedure: EXCISION MASS RIGHT FOREARM;  Surgeon: Leanora Cover, MD;  Location: [facility];  Service: Orthopedics;  Laterality: Right;  45 MIN   GANGLION CYST EXCISION Left    INTERCOSTAL NERVE BLOCK  07/19/2003   KNEE ARTHROSCOPY Right 07/18/2014   MASS EXCISION N/A 01/25/2021   Procedure: EXCISION SUBCUTANEOUS VS SUBFASCIAL MASS TORSO 3CM;  Surgeon: Irene Limbo, MD;  Location: [facility] [place];  Service: Plastics;  Laterality: N/A;   PILONIDAL CYST EXCISION N/A 09/13/2022   Procedure: CYST EXCISION PILONIDAL EXTENSIVE;  Surgeon: Olean Ree, MD;  Location: [facility];  Service: General;  Laterality: N/A;   Patient Active Problem List   Diagnosis Date Noted   Pilonidal cyst 09/13/2022   Aortic atherosclerosis ([place]) 06/07/2022   Hypercoagulable state due to paroxysmal atrial fibrillation ([place]) 03/23/2021   Type 2 diabetes mellitus with hyperglycemia, without long-term current use of insulin ([place]) 02/10/2021   Neck pain on left side 03/06/2019   Musculoskeletal chest pain 07/24/2018   Asthma, mild intermittent 05/02/2018   Allergic rhinitis caused by mold 05/02/2018   Tobacco use 05/02/2018   Bilateral carpal tunnel syndrome 01/10/2018   Stroke ([place])    Schizophrenia ([place])    Migraine    History of nuclear stress test    History of alcohol abuse    GERD (gastroesophageal reflux disease)    Dysrhythmia    Chronic lower back pain    Anxiety    Arthritis of knee, right 04/12/2017   Bipolar disorder ([place]) 04/03/2017   Lipoma of forehead 09/22/2016   Trigger ring finger of right hand 06/22/2016   Paroxysmal atrial fibrillation (HCC)    AF (atrial fibrillation) ([place]) 12/10/2015   Eczema 07/10/2015   History of CVA (cerebrovascular accident) 05/04/2015   Tear of medial meniscus of right knee 03/18/2015   Chronic pain of right knee 03/12/2015   Other and unspecified hyperlipidemia  11/25/2013   Nasal congestion 11/25/2013   Sinusitis, chronic 05/09/2013   Chronic low back pain 10/12/2012   Lumbar radiculopathy 10/12/2012   Hypertension 10/12/2012   Depression 10/12/2012   Insomnia 10/12/2012    REFERRING DIAG: M47.22 (ICD-10-CM) - Other spondylosis with radiculopathy, cervical region   THERAPY DIAG:  Cervicalgia  Abnormal posture  Muscle weakness (generalized)  Rationale for Evaluation and Treatment rehabilitation  PERTINENT HISTORY:  Migraines, bipolar disorder, hx of bilat carpal tunnel, PAF  PRECAUTIONS: None   WEIGHT BEARING RESTRICTIONS: No  SUBJECTIVE:  SUBJECTIVE STATEMENT: The Dry needling helped for a couple of days . Then the sharp pain came back. The pain is worse on the right side of the neck today. Still waiting on the CT scan or MRI of shoulder.    PAIN:  Are you having pain? Yes: NPRS scale: 9/10 Pain location: neck pain R>L Pain description: Throb, ache Aggravating factors: Turning my head  Relieving factors: Not sure, heat and cold packs don't seem to help   OBJECTIVE: (objective measures completed at initial evaluation unless otherwise dated)   DIAGNOSTIC FINDINGS:                        CT scan 07/27/22 IMPRESSION: 1. No acute fracture or traumatic listhesis of the cervical spine. 2. 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   PATIENT SURVEYS:  NDI=41, 82% complete disability   COGNITION: Overall cognitive status: Within functional limits for tasks assessed   SENSATION: WFL   POSTURE: rounded shoulders and forward head   PALPATION: TTP of the cervical paraspinals and upper trap with increased muscle tension, L>R   CERVICAL ROM:    Active ROM  A/PROM (deg) eval AROM 09/16/22 AROM 10/04/22  Flexion 35 pulling posterior bilat 32   Extension 40 pulling R lateral 40   Right lateral flexion 15 pulling bilat 15   Left lateral flexion 15 " needs to pop" 15   Right rotation 30 puling L lateral 30 30  Left rotation 40 pulling R lateral 38 42   (Blank rows = not tested)   UPPER EXTREMITY ROM: Passively able to lift arms to 110d bilat before report of pain as limitation Active ROM Right eval Left eval PROM LEFT 09/29/22  Shoulder flexion 90 90 145  Shoulder extension       Shoulder abduction     145  Shoulder adduction       Shoulder extension       Shoulder internal rotation     50  Shoulder external rotation     55  Elbow flexion       Elbow extension       Wrist flexion       Wrist extension       Wrist ulnar deviation       Wrist radial deviation       Wrist pronation       Wrist supination        (Blank rows = not tested)   UPPER EXTREMITY MMT: Grossly 4+ to 5/5 and equal bilat. Neck weakness noted per poor posture MMT Right eval Left eval  Shoulder flexion      Shoulder extension      Shoulder abduction      Shoulder adduction      Shoulder extension      Shoulder internal rotation      Shoulder external rotation      Middle trapezius      Lower trapezius      Elbow flexion      Elbow extension      Wrist flexion      Wrist extension      Wrist ulnar deviation      Wrist radial deviation      Wrist pronation      Wrist supination      Grip strength       (Blank rows = not tested)  CERVICAL SPECIAL TESTS:  Spurling's test positive; Distraction test positive   FUNCTIONAL TESTS:  NT   TODAY'S TREATMENT:  [facility] Adult PT Treatment:                                                DATE: 10/04/22 Therapeutic Exercise: DNF 5 sec x 10 Supine head on ball cervical rotations  and head nods  Supine head on ball head press 5 sec x 10  Manual Therapy: Seated STW to bilateral upper traps and cervical  paraspinals. Supine passive cervical stretches and manual distraction.  Supine passive cervical rotation is >50 degrees each way   Modalities: HMP to cervical x 15 minutes    [facility] Adult PT Treatment:                                                DATE: 09/29/22 Therapeutic Exercise: Seated upper trap and levator stretches Seated chin tuck Supine clasped hand pullovers x 5 for self stretch  Manual Therapy: STW to bilateral upper trap and persicap in prone Modalties: MMP Cervical x 15 minutes Trigger Point Dry Needling Treatment:Performed by Gar Ponto, PT Pre-treatment instruction: Patient instructed on dry needling rationale, procedures, and possible side effects including pain during treatment (achy,cramping feeling), bruising, drop of blood, lightheadedness, nausea, sweating. Patient Consent Given: Yes Education handout provided: Yes Muscles treated: Bilat upper trap  Needle size and number: .30x67mm x 2 Electrical stimulation performed: No Parameters: N/A Treatment response/outcome: Twitch response elicited Post-treatment instructions: Patient instructed to expect possible mild to moderate muscle soreness later today and/or tomorrow. Patient instructed in methods to reduce muscle soreness and to continue prescribed HEP. If patient was dry needled over the lung field, patient was instructed on signs and symptoms of pneumothorax and, however unlikely, to see immediate medical attention should they occur. Patient was also educated on signs and symptoms of infection and to seek medical attention should they occur. Patient verbalized understanding of these instructions and education.     [facility] Adult PT Treatment:                                                DATE: 09/22/22 Therapeutic Exercise: Supine: shoulder press isometric  Supine: ball under head: cervical rotation and nods Supine chest press with dowel Supine pullovers with dowel in comfortable ROM  Supine DNF 15 sec, 25 sec   Supine chin tuck with arm circles  Supine chin tuck with horizontal abduction  Manual Therapy: Cervical manual traction  Modalities: HMP to cervical x 10 minutes    [facility] Adult PT Treatment:                                                DATE: 09/20/22 Therapeutic Exercise: Supine cervical retraction x10 3"  Supine cervical lift offs x10 5" Seated Cervical Rotation AROM 3 reps - 15 hold Gentle Levator Scapulae Stretch 3 reps - 15 hold Supine shoulder ER 2x10 RTB Shoulder  extension RTB 2x10 Manual Therapy: STM and DTM to the cervical paraspinals and upper trap Cervical manual traction- pt reports good relief Skilled palapation to identify TrPs and taut muscle bands Trigger Point Dry Needling Treatment: Pre-treatment instruction: Patient instructed on dry needling rationale, procedures, and possible side effects including pain during treatment (achy,cramping feeling), bruising, drop of blood, lightheadedness, nausea, sweating. Patient Consent Given: Yes Education handout provided: Yes Muscles treated: Bilat upper trap  Needle size and number: .30x56mm x 2 Electrical stimulation performed: No Parameters: N/A Treatment response/outcome: Twitch response elicited Post-treatment instructions: Patient instructed to expect possible mild to moderate muscle soreness later today and/or tomorrow. Patient instructed in methods to reduce muscle soreness and to continue prescribed HEP. If patient was dry needled over the lung field, patient was instructed on signs and symptoms of pneumothorax and, however unlikely, to see immediate medical attention should they occur. Patient was also educated on signs and symptoms of infection and to seek medical attention should they occur. Patient verbalized understanding of these instructions and education.   [facility] Adult PT Treatment:                                                DATE: 09/16/22 Therapeutic Exercise: Seated Cervical Retraction 10 reps - 3  hold Seated Scapular Retraction  10 reps - 3 hold Seated Cervical Rotation AROM 3 reps - 15 hold Gentle Levator Scapulae Stretch 3 reps - 15 hold Supine shoulder ER 3x10 GTB Standing Row GTB 3x10 Shoulder extension GTB 3x10 Self Care: Use of heat and cold packs, and TENs unit set up for pain management Explanation of anatomy and pathophysiology for rationale of possible cause for pain and dysfunction and the purpose of PT  . PATIENT EDUCATION:  Education details: Eval findings, POC, HEP, self care  Person educated: Patient Education method: Explanation, Demonstration, Tactile cues, Verbal cues, and Handouts Education comprehension: verbalized understanding, returned demonstration, verbal cues required, and tactile cues required   HOME EXERCISE PROGRAM: Access Code: [email or web address] URL: [email or web address] Date: 09/07/2022 Prepared by: Gar Ponto  Exercises - Seated Cervical Retraction  - 6 x daily - 7 x weekly - 1 sets - 3-5 reps - 3 hold - Supine Deep Neck Flexor Training - Hold  - 1 x daily - 7 x weekly - 1 sets - 10 reps - 5 hold - Seated Scapular Retraction  - 6 x daily - 7 x weekly - 1 sets - 3-5 reps - 3 hold - Seated Cervical Rotation AROM  - 6 x daily - 7 x weekly - 1 sets - 3 reps - 15 hold - Gentle Levator Scapulae Stretch  - 1 x daily - 7 x weekly - 1 sets - 3 reps - 15 hold - Standing Row with Anchored Resistance  - 1 x daily - 7 x weekly - 3 sets - 10 reps - Shoulder extension with resistance - Neutral  - 1 x daily - 7 x weekly - 3 sets - 10 reps   ASSESSMENT:   CLINICAL IMPRESSION: Pt reports 9-10/10 cervical pain and short term relief after TPDN last session. Scheduled for shoulder MRI April 6th. Manual STW and stretching was the focus of session today. His PROM is >50 for cervical rotation bilateral. He was given biofreeze muscle rub samples for home. Repeated HMP at end of session to further reduce pain and  tension.       OBJECTIVE IMPAIRMENTS:  decreased activity tolerance, decreased ROM, decreased strength, increased muscle spasms, impaired flexibility, impaired UE functional use, postural dysfunction, obesity, and pain.    ACTIVITY LIMITATIONS: carrying, lifting, bending, sitting, sleeping, dressing, reach over head, and caring for others   PARTICIPATION LIMITATIONS: meal prep, cleaning, laundry, and driving   PERSONAL FACTORS: Age, Fitness, Past/current experiences, Time since onset of injury/illness/exacerbation, and 1 comorbidity: high BMI  are also affecting patient's functional outcome.    REHAB POTENTIAL: Good   CLINICAL DECISION MAKING: Evolving/moderate complexity   EVALUATION COMPLEXITY: Moderate     GOALS:     SHORT TERM GOALS: Target date: 09/16/22   Pt will be Ind in an initial HEP Baseline: initial Goal status: MET 09/16/22   2.  Pt will voice understanding of measures to assist in pain reduction Baseline: Initial  Goal status: MET 09/16/22, use of heat and cold packs, TENs unit   LONG TERM GOALS: Target date: 10/14/22   Pt will be Ind in a final HEP to maintain achieved LOF  Baseline: initiated Goal status: Ongoing   2.  Pt will be able to demonstrate proper sitting posture Baseline: Poor posture 10/04/22: poor posture Goal status: Ongoing   3.  Increase cervical side bending and and rotation for improved neck function, especially with driving Baseline: see flow sheets Goal status: Ongoing   4.  Pt will demonstrate DNF endurance of 20" with a single attempt Baseline: NT 09/22/22: 25 sec Goal status: MET   5.  Pt will report neck pain of 4/10 or less for improved neck function and QOL Baseline: 8/10 Goal status: Ongoing     PLAN:   PT FREQUENCY: 2x/week   PT DURATION: 8 weeks   PLANNED INTERVENTIONS: Therapeutic exercises, Therapeutic activity, Patient/Family education, Self Care, Dry Needling, Electrical stimulation, Spinal manipulation, Spinal mobilization, Cryotherapy, Moist heat, Taping,  Traction, Ultrasound, Ionotophoresis 4mg /ml Dexamethasone, Manual therapy, and Re-evaluation   PLAN FOR NEXT SESSION: Review NDI; assess response to HEP; progress therex as indicated; use of modalities, manual therapy; and TPDN as indicated.   Hessie Diener, PTA 10/04/22 10:22 AM Phone: [phone] Fax: [phone]

## 2022-10-05 DIAGNOSIS — I480 Paroxysmal atrial fibrillation: Secondary | ICD-10-CM | POA: Diagnosis not present

## 2022-10-06 ENCOUNTER — Encounter: Payer: Self-pay | Admitting: Physical Therapy

## 2022-10-06 ENCOUNTER — Ambulatory Visit: Payer: Medicaid Other | Admitting: Physical Therapy

## 2022-10-06 ENCOUNTER — Telehealth: Payer: Self-pay | Admitting: Surgery

## 2022-10-06 DIAGNOSIS — R252 Cramp and spasm: Secondary | ICD-10-CM | POA: Diagnosis not present

## 2022-10-06 DIAGNOSIS — M542 Cervicalgia: Secondary | ICD-10-CM | POA: Diagnosis not present

## 2022-10-06 DIAGNOSIS — I480 Paroxysmal atrial fibrillation: Secondary | ICD-10-CM | POA: Diagnosis not present

## 2022-10-06 DIAGNOSIS — R293 Abnormal posture: Secondary | ICD-10-CM | POA: Diagnosis not present

## 2022-10-06 DIAGNOSIS — M6281 Muscle weakness (generalized): Secondary | ICD-10-CM

## 2022-10-06 NOTE — Therapy (Signed)
OUTPATIENT PHYSICAL THERAPY TREATMENT NOTE   Patient Name: Jason Stokes MRN: [id number] DOB:05-15-1961, 62 y.o., male Today's Date: 10/06/2022  PCP: Charlott Rakes, MD   REFERRING PROVIDER: Ashok Pall, MD  END OF SESSION:   PT End of Session - 10/06/22 0945     Visit Number 9    Number of Visits 16    Date for PT Re-Evaluation 10/14/22    Authorization Type MEDICAID [place] ACCESS    Authorization Time Period Approved 12 PT visits from 09/20/22-10/31/22    Authorization - Visit Number 5    Authorization - Number of Visits 12    Progress Note Due on Visit 10    PT Start Time 0945   15 min late   PT Stop Time 1030    PT Time Calculation (min) 45 min               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)    a.) s/p embolization 08/19/2020: 29 mm RIGHT internal iliac artery aneurysm   Anxiety    Aortic atherosclerosis (HCC)    Bilateral carpal tunnel syndrome 01/10/2018   Bipolar disorder ([place])    Chorioretinal inflammation of both eyes    a.) on azothioprine   Chronic lower back pain    Coronary artery calcification seen on CT scan    a.) cCTA 04/21/2022: Ca score 20.6 (61st percentile for age/sex match control)   DDD (degenerative disc disease), cervical    Depression    Diastolic dysfunction    a.) TTE 01/13/2021: EF 60-65%, mod LVH, triv MR, G1DD   GERD (gastroesophageal reflux disease)    Hepatic steatosis    History of alcohol abuse    History of nuclear stress test    Myoview 10/16: EF 50%, diaphragmatic attenuation, no ischemia, low risk   Hypertension    Lacunar 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   a.) CT head 12/25/2012 --> RIGHT basal ganglia hypoattenuation related to remote lacunar infarct   Lipoma    Long term (current) use of anticoagulants    a.) apixaban   Long-term current use of immunomodulator    a.) on azothioprine for peripheral focal chorioretinal inflammation (both eyes)    Marijuana use    Migraine    Moderate persistent asthma with acute exacerbation 05/02/2018   PAF (paroxysmal atrial fibrillation) ([place]) 03/27/2015   a.) CHA2DS2VASc = 5 (HTN, CVA x2, vascular disease history, T2DM);  b.) s/p ablation 10/02/2015; c.) s/p ablation 12/10/2015; d.) s/p DCCV (200 J x 1) 12/11/2015; e.) s/p ablation 04/28/2022; f.) rate/rhythm maintained on oral diltiazem + carvedilol; chronically anticoagulated with apixaban   Pilonidal cyst    Schizophrenia ([place])    Sciatica neuralgia    T2DM (type 2 diabetes mellitus) ([place])    Thoracic aortic ectasia ([place]) 01/13/2021   a.) TTE 01/13/2021: Ao root 38 mm, asc Ao 39 mm   Past Surgical History:  Procedure Laterality Date   ATRIAL FIBRILLATION ABLATION  09/22/2015   ATRIAL FIBRILLATION ABLATION N/A 04/28/2022   Procedure: ATRIAL FIBRILLATION ABLATION;  Surgeon: Constance Haw, MD;  Location: [facility];  Service: Cardiovascular;  Laterality: N/A;   CATARACT EXTRACTION Bilateral    CYST EXCISION  1996-97   surgery back of head    ELECTROPHYSIOLOGIC STUDY N/A 09/22/2015   Procedure: Atrial Fibrillation Ablation;  Surgeon: Will [name], MD;  Location: [facility];  Service: Cardiovascular;  Laterality: N/A;  ELECTROPHYSIOLOGIC STUDY N/A 12/10/2015   Procedure: Atrial Fibrillation Ablation;  Surgeon: Will [name], MD;  Location: [facility];  Service: Cardiovascular;  Laterality: N/A;   ELECTROPHYSIOLOGIC STUDY N/A 12/11/2015   Procedure: Cardioversion;  Surgeon: Will [name], MD;  Location: [facility];  Service: Cardiovascular;  Laterality: N/A;   EMBOLIZATION Right 08/19/2020   Procedure: EMBOLIZATION;  Surgeon: Cherre Robins, MD;  Location: [facility];  Service: Cardiovascular;  Laterality: Right;  hypogastric   EXCISION MASS HEAD N/A 01/06/2017   Procedure: EXCISION MASS FOREHEAD;  Surgeon: Irene Limbo, MD;  Location: [facility];  Service:  Plastics;  Laterality: N/A;   EXCISION MASS UPPER EXTREMETIES Right 08/08/2022   Procedure: EXCISION MASS RIGHT FOREARM;  Surgeon: Leanora Cover, MD;  Location: [facility];  Service: Orthopedics;  Laterality: Right;  45 MIN   GANGLION CYST EXCISION Left    INTERCOSTAL NERVE BLOCK  07/19/2003   KNEE ARTHROSCOPY Right 07/18/2014   MASS EXCISION N/A 01/25/2021   Procedure: EXCISION SUBCUTANEOUS VS SUBFASCIAL MASS TORSO 3CM;  Surgeon: Irene Limbo, MD;  Location: [facility];  Service: Plastics;  Laterality: N/A;   PILONIDAL CYST EXCISION N/A 09/13/2022   Procedure: CYST EXCISION PILONIDAL EXTENSIVE;  Surgeon: Olean Ree, MD;  Location: [facility];  Service: General;  Laterality: N/A;   Patient Active Problem List   Diagnosis Date Noted   Pilonidal cyst 09/13/2022   Aortic atherosclerosis ([place]) 06/07/2022   Hypercoagulable state due to paroxysmal atrial fibrillation ([place]) 03/23/2021   Type 2 diabetes mellitus with hyperglycemia, without long-term current use of insulin ([place]) 02/10/2021   Neck pain on left side 03/06/2019   Musculoskeletal chest pain 07/24/2018   Asthma, mild intermittent 05/02/2018   Allergic rhinitis caused by mold 05/02/2018   Tobacco use 05/02/2018   Bilateral carpal tunnel syndrome 01/10/2018   Stroke ([place])    Schizophrenia ([place])    Migraine    History of nuclear stress test    History of alcohol abuse    GERD (gastroesophageal reflux disease)    Dysrhythmia    Chronic lower back pain    Anxiety    Arthritis of knee, right 04/12/2017   Bipolar disorder ([place]) 04/03/2017   Lipoma of forehead 09/22/2016   Trigger ring finger of right hand 06/22/2016   Paroxysmal atrial fibrillation (HCC)    AF (atrial fibrillation) ([place]) 12/10/2015   Eczema 07/10/2015   History of CVA (cerebrovascular accident) 05/04/2015   Tear of medial meniscus of right knee 03/18/2015   Chronic pain of right knee 03/12/2015   Other and unspecified  hyperlipidemia 11/25/2013   Nasal congestion 11/25/2013   Sinusitis, chronic 05/09/2013   Chronic low back pain 10/12/2012   Lumbar radiculopathy 10/12/2012   Hypertension 10/12/2012   Depression 10/12/2012   Insomnia 10/12/2012    REFERRING DIAG: M47.22 (ICD-10-CM) - Other spondylosis with radiculopathy, cervical region   THERAPY DIAG:  Cervicalgia  Abnormal posture  Muscle weakness (generalized)  Rationale for Evaluation and Treatment rehabilitation  PERTINENT HISTORY:  Migraines, bipolar disorder, hx of bilat carpal tunnel, PAF  PRECAUTIONS: None   WEIGHT BEARING RESTRICTIONS: No  SUBJECTIVE:  SUBJECTIVE STATEMENT: I have relief for a day or so after PT. It helps a lot.    The Dry needling helped for a couple of days. Then the sharp pain came back. The pain is worse on the right side of the neck today. Still waiting on the CT scan or MRI of shoulder.    PAIN:  Are you having pain? Yes: NPRS scale: 9/10 Pain location: neck pain R>L Pain description: Throb, ache Aggravating factors: Turning my head  Relieving factors: Not sure, heat and cold packs don't seem to help   OBJECTIVE: (objective measures completed at initial evaluation unless otherwise dated)   DIAGNOSTIC FINDINGS:                        CT scan 07/27/22 IMPRESSION: 1. No acute fracture or traumatic listhesis of the cervical spine. 2. 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   PATIENT SURVEYS:  NDI=41, 82% complete disability   COGNITION: Overall cognitive status: Within functional limits for tasks assessed   SENSATION: WFL   POSTURE: rounded shoulders and forward head   PALPATION: TTP of the cervical paraspinals and upper  trap with increased muscle tension, L>R   CERVICAL ROM:    Active ROM A/PROM (deg) eval AROM 09/16/22 AROM 10/04/22  Flexion 35 pulling posterior bilat 32   Extension 40 pulling R lateral 40   Right lateral flexion 15 pulling bilat 15   Left lateral flexion 15 " needs to pop" 15   Right rotation 30 puling L lateral 30 30  Left rotation 40 pulling R lateral 38 42   (Blank rows = not tested)   UPPER EXTREMITY ROM: Passively able to lift arms to 110d bilat before report of pain as limitation Active ROM Right eval Left eval PROM LEFT 09/29/22  Shoulder flexion 90 90 145  Shoulder extension       Shoulder abduction     145  Shoulder adduction       Shoulder extension       Shoulder internal rotation     50  Shoulder external rotation     55  Elbow flexion       Elbow extension       Wrist flexion       Wrist extension       Wrist ulnar deviation       Wrist radial deviation       Wrist pronation       Wrist supination        (Blank rows = not tested)   UPPER EXTREMITY MMT: Grossly 4+ to 5/5 and equal bilat. Neck weakness noted per poor posture MMT Right eval Left eval  Shoulder flexion      Shoulder extension      Shoulder abduction      Shoulder adduction      Shoulder extension      Shoulder internal rotation      Shoulder external rotation      Middle trapezius      Lower trapezius      Elbow flexion      Elbow extension      Wrist flexion      Wrist extension      Wrist ulnar deviation      Wrist radial deviation      Wrist pronation      Wrist supination  Grip strength       (Blank rows = not tested)   CERVICAL SPECIAL TESTS:  Spurling's test positive; Distraction test positive   FUNCTIONAL TESTS:  NT   TODAY'S TREATMENT:  [facility] Adult PT Treatment:                                                DATE: 10/06/22 Therapeutic Exercise: DNF on towel x 10 DNF with 10  arm circles each way Supine head on ball cervical rotations and head nods  Head  lift 10 sec x 5  Supine head on ball with head press 5 sec x 10  Manual Therapy: Supine TPR to upper traps bilat Supine passive cervical stretches and manual distraction.  Supine passive cervical rotation is >50 degrees each way   Modalities: Estim (IFC) with HMP x 15 minutes to tolerance  Self Care: Tennis ball vs theracane  [facility] Adult PT Treatment:                                                DATE: 10/04/22 Therapeutic Exercise: DNF 5 sec x 10 Supine head on ball cervical rotations  and head nods  Supine head on ball head press 5 sec x 10  Manual Therapy: Seated STW to bilateral upper traps and cervical paraspinals. Supine passive cervical stretches and manual distraction.  Supine passive cervical rotation is >50 degrees each way   Modalities: HMP to cervical x 15 minutes    [facility] Adult PT Treatment:                                                DATE: 09/29/22 Therapeutic Exercise: Seated upper trap and levator stretches Seated chin tuck Supine clasped hand pullovers x 5 for self stretch  Manual Therapy: STW to bilateral upper trap and persicap in prone Modalties: MMP Cervical x 15 minutes Trigger Point Dry Needling Treatment:Performed by Gar Ponto, PT Pre-treatment instruction: Patient instructed on dry needling rationale, procedures, and possible side effects including pain during treatment (achy,cramping feeling), bruising, drop of blood, lightheadedness, nausea, sweating. Patient Consent Given: Yes Education handout provided: Yes Muscles treated: Bilat upper trap  Needle size and number: .30x85mm x 2 Electrical stimulation performed: No Parameters: N/A Treatment response/outcome: Twitch response elicited Post-treatment instructions: Patient instructed to expect possible mild to moderate muscle soreness later today and/or tomorrow. Patient instructed in methods to reduce muscle soreness and to continue prescribed HEP. If patient was dry needled over the lung  field, patient was instructed on signs and symptoms of pneumothorax and, however unlikely, to see immediate medical attention should they occur. Patient was also educated on signs and symptoms of infection and to seek medical attention should they occur. Patient verbalized understanding of these instructions and education.     [facility] Adult PT Treatment:                                                DATE:  09/22/22 Therapeutic Exercise: Supine: shoulder press isometric  Supine: ball under head: cervical rotation and nods Supine chest press with dowel Supine pullovers with dowel in comfortable ROM  Supine DNF 15 sec, 25 sec  Supine chin tuck with arm circles  Supine chin tuck with horizontal abduction  Manual Therapy: Cervical manual traction  Modalities: HMP to cervical x 10 minutes    [facility] Adult PT Treatment:                                                DATE: 09/20/22 Therapeutic Exercise: Supine cervical retraction x10 3"  Supine cervical lift offs x10 5" Seated Cervical Rotation AROM 3 reps - 15 hold Gentle Levator Scapulae Stretch 3 reps - 15 hold Supine shoulder ER 2x10 RTB Shoulder extension RTB 2x10 Manual Therapy: STM and DTM to the cervical paraspinals and upper trap Cervical manual traction- pt reports good relief Skilled palapation to identify TrPs and taut muscle bands Trigger Point Dry Needling Treatment: Pre-treatment instruction: Patient instructed on dry needling rationale, procedures, and possible side effects including pain during treatment (achy,cramping feeling), bruising, drop of blood, lightheadedness, nausea, sweating. Patient Consent Given: Yes Education handout provided: Yes Muscles treated: Bilat upper trap  Needle size and number: .30x57mm x 2 Electrical stimulation performed: No Parameters: N/A Treatment response/outcome: Twitch response elicited Post-treatment instructions: Patient instructed to expect possible mild to moderate muscle soreness later  today and/or tomorrow. Patient instructed in methods to reduce muscle soreness and to continue prescribed HEP. If patient was dry needled over the lung field, patient was instructed on signs and symptoms of pneumothorax and, however unlikely, to see immediate medical attention should they occur. Patient was also educated on signs and symptoms of infection and to seek medical attention should they occur. Patient verbalized understanding of these instructions and education.   [facility] Adult PT Treatment:                                                DATE: 09/16/22 Therapeutic Exercise: Seated Cervical Retraction 10 reps - 3 hold Seated Scapular Retraction  10 reps - 3 hold Seated Cervical Rotation AROM 3 reps - 15 hold Gentle Levator Scapulae Stretch 3 reps - 15 hold Supine shoulder ER 3x10 GTB Standing Row GTB 3x10 Shoulder extension GTB 3x10 Self Care: Use of heat and cold packs, and TENs unit set up for pain management Explanation of anatomy and pathophysiology for rationale of possible cause for pain and dysfunction and the purpose of PT  . PATIENT EDUCATION:  Education details: Eval findings, POC, HEP, self care  Person educated: Patient Education method: Explanation, Demonstration, Tactile cues, Verbal cues, and Handouts Education comprehension: verbalized understanding, returned demonstration, verbal cues required, and tactile cues required   HOME EXERCISE PROGRAM: Access Code: [email or web address] URL: [email or web address] Date: 09/07/2022 Prepared by: Gar Ponto  Exercises - Seated Cervical Retraction  - 6 x daily - 7 x weekly - 1 sets - 3-5 reps - 3 hold - Supine Deep Neck Flexor Training - Hold  - 1 x daily - 7 x weekly - 1 sets - 10 reps - 5 hold - Seated Scapular Retraction  - 6 x daily - 7 x weekly - 1 sets - 3-5  reps - 3 hold - Seated Cervical Rotation AROM  - 6 x daily - 7 x weekly - 1 sets - 3 reps - 15 hold - Gentle Levator Scapulae Stretch  - 1 x daily - 7 x weekly - 1  sets - 3 reps - 15 hold - Standing Row with Anchored Resistance  - 1 x daily - 7 x weekly - 3 sets - 10 reps - Shoulder extension with resistance - Neutral  - 1 x daily - 7 x weekly - 3 sets - 10 reps   ASSESSMENT:   CLINICAL IMPRESSION: Pt reports 9-10/10 cervical pain and short term relief after each PT session. He responded well to manual TPR however declines more TPDN as his last response was not as favorable. Introduced theracane vs tennis ball pressure for self TPR. He was given information on how to purchase a theracane. Estim and Hmp applied to day to reduce pain.       OBJECTIVE IMPAIRMENTS: decreased activity tolerance, decreased ROM, decreased strength, increased muscle spasms, impaired flexibility, impaired UE functional use, postural dysfunction, obesity, and pain.    ACTIVITY LIMITATIONS: carrying, lifting, bending, sitting, sleeping, dressing, reach over head, and caring for others   PARTICIPATION LIMITATIONS: meal prep, cleaning, laundry, and driving   PERSONAL FACTORS: Age, Fitness, Past/current experiences, Time since onset of injury/illness/exacerbation, and 1 comorbidity: high BMI  are also affecting patient's functional outcome.    REHAB POTENTIAL: Good   CLINICAL DECISION MAKING: Evolving/moderate complexity   EVALUATION COMPLEXITY: Moderate     GOALS:     SHORT TERM GOALS: Target date: 09/16/22   Pt will be Ind in an initial HEP Baseline: initial Goal status: MET 09/16/22   2.  Pt will voice understanding of measures to assist in pain reduction Baseline: Initial  Goal status: MET 09/16/22, use of heat and cold packs, TENs unit   LONG TERM GOALS: Target date: 10/14/22   Pt will be Ind in a final HEP to maintain achieved LOF  Baseline: initiated Goal status: Ongoing   2.  Pt will be able to demonstrate proper sitting posture Baseline: Poor posture 10/04/22: poor posture Goal status: Ongoing   3.  Increase cervical side bending and and rotation for  improved neck function, especially with driving Baseline: see flow sheets Goal status: Ongoing   4.  Pt will demonstrate DNF endurance of 20" with a single attempt Baseline: NT 09/22/22: 25 sec Goal status: MET   5.  Pt will report neck pain of 4/10 or less for improved neck function and QOL Baseline: 8/10 Goal status: Ongoing     PLAN:   PT FREQUENCY: 2x/week   PT DURATION: 8 weeks   PLANNED INTERVENTIONS: Therapeutic exercises, Therapeutic activity, Patient/Family education, Self Care, Dry Needling, Electrical stimulation, Spinal manipulation, Spinal mobilization, Cryotherapy, Moist heat, Taping, Traction, Ultrasound, Ionotophoresis 4mg /ml Dexamethasone, Manual therapy, and Re-evaluation   PLAN FOR NEXT SESSION: Did he get theracane? Review NDI; assess response to HEP; progress therex as indicated; use of modalities, manual therapy; and TPDN as indicated (does not want anymore TPDN).   Hessie Diener, PTA 10/06/22 10:48 AM Phone: [phone] Fax: [phone]

## 2022-10-06 NOTE — Telephone Encounter (Signed)
Patient had pilonidal cyst surgery on 09/13/22.  States that he is having some clear like drainage with swelling at the site. Feels warm to touch.  No discoloration.  States that it "hurts real bad". Wants something for pain?  Please call him.  Thank you.

## 2022-10-07 ENCOUNTER — Ambulatory Visit (INDEPENDENT_AMBULATORY_CARE_PROVIDER_SITE_OTHER): Payer: Medicaid Other | Admitting: Surgery

## 2022-10-07 ENCOUNTER — Encounter: Payer: Self-pay | Admitting: Surgery

## 2022-10-07 VITALS — BP 148/84 | HR 73 | Temp 98.3°F | Ht 73.0 in | Wt 248.0 lb

## 2022-10-07 DIAGNOSIS — L0591 Pilonidal cyst without abscess: Secondary | ICD-10-CM

## 2022-10-07 DIAGNOSIS — Z09 Encounter for follow-up examination after completed treatment for conditions other than malignant neoplasm: Secondary | ICD-10-CM

## 2022-10-07 DIAGNOSIS — I480 Paroxysmal atrial fibrillation: Secondary | ICD-10-CM | POA: Diagnosis not present

## 2022-10-07 DIAGNOSIS — L7633 Postprocedural seroma of skin and subcutaneous tissue following a dermatologic procedure: Secondary | ICD-10-CM

## 2022-10-07 NOTE — Progress Notes (Signed)
10/07/2022  HPI: KHYLIN KITTLER is a 62 y.o. male s/p excision of pilonidal cyst on 09/13/2022.  Patient was last seen on 09/28/2022 at which time the last of his sutures were removed.  He reports that earlier this week, he started having some pain at the gluteal cleft area about 3 days ago.  Then yesterday started noticing significant drainage coming from the wound.  Denies any fevers or chills but reports that the area was tender to touch and felt warm.  Reports that the drainage has been clear type fluid and not purulent.  Vital signs: BP (!) 148/84   Pulse 73   Temp 98.3 F (36.8 C)   Ht 6\' 1"  (1.854 m)   Wt 248 lb (112.5 kg)   SpO2 100%   BMI 32.72 kg/m    Physical Exam: Constitutional: No acute distress Skin: Gluteal cleft incision appears to have 2 small areas of dehiscence that is superficial.  At the inferior pole of the incision, the patient has a dehiscence of about 1 cm and just superior to that a small area of dehiscence of about 3 mm.  Both are superficial in nature.  There is no surrounding erythema or induration.  The skin is very soft.  Currently there is no drainage  Assessment/Plan: This is a 62 y.o. male s/p pilonidal cyst excision  -Discussed with the patient that likely what happened this is a result of postoperative seroma that built enough pressure at the inferior portion of the wound resulting in dehiscence and drainage.  The skin surrounding the incision is soft without any induration and there is no erythema.  There is no current drainage on exam.  I do not think this was an abscess and no I&D or antibiotics are needed at this point.  Discussed with the patient doing a dry gauze dressing over the wound to catch any further drainage.  This will start healing on its own. - Follow-up as needed.   Melvyn Neth, [facility]

## 2022-10-07 NOTE — Patient Instructions (Addendum)
The area is not infected. It had some fluid that you body will produce that needed to drain out. Keep the area washed with soap and water with your showers, rinse well, and pat dry. You may keep a dressing on the area until it stops draining.  We will refill your pain medication.   Call and let us know if you start to develop any fever or the area starts to drain thick smelly liquid.   Try to sit on the left or right side to keep the pressure off of the area. If the area is still draining in 2 weeks call us to be seen again.    Follow-up with our office as needed.  Please call and ask to speak with a nurse if you develop questions or concerns.

## 2022-10-08 DIAGNOSIS — I480 Paroxysmal atrial fibrillation: Secondary | ICD-10-CM | POA: Diagnosis not present

## 2022-10-09 DIAGNOSIS — I480 Paroxysmal atrial fibrillation: Secondary | ICD-10-CM | POA: Diagnosis not present

## 2022-10-10 DIAGNOSIS — R892 Abnormal level of other drugs, medicaments and biological substances in specimens from other organs, systems and tissues: Secondary | ICD-10-CM | POA: Diagnosis not present

## 2022-10-10 DIAGNOSIS — M542 Cervicalgia: Secondary | ICD-10-CM | POA: Diagnosis not present

## 2022-10-10 DIAGNOSIS — I480 Paroxysmal atrial fibrillation: Secondary | ICD-10-CM | POA: Diagnosis not present

## 2022-10-10 DIAGNOSIS — Z79899 Other long term (current) drug therapy: Secondary | ICD-10-CM | POA: Diagnosis not present

## 2022-10-10 DIAGNOSIS — G8929 Other chronic pain: Secondary | ICD-10-CM | POA: Diagnosis not present

## 2022-10-10 DIAGNOSIS — M545 Low back pain, unspecified: Secondary | ICD-10-CM | POA: Diagnosis not present

## 2022-10-11 ENCOUNTER — Other Ambulatory Visit: Payer: Self-pay | Admitting: Physician Assistant

## 2022-10-11 ENCOUNTER — Ambulatory Visit: Payer: Medicaid Other | Admitting: Physical Therapy

## 2022-10-11 ENCOUNTER — Encounter: Payer: Self-pay | Admitting: Physical Therapy

## 2022-10-11 DIAGNOSIS — I480 Paroxysmal atrial fibrillation: Secondary | ICD-10-CM | POA: Diagnosis not present

## 2022-10-11 DIAGNOSIS — M542 Cervicalgia: Secondary | ICD-10-CM

## 2022-10-11 DIAGNOSIS — R293 Abnormal posture: Secondary | ICD-10-CM

## 2022-10-11 DIAGNOSIS — M6281 Muscle weakness (generalized): Secondary | ICD-10-CM

## 2022-10-11 DIAGNOSIS — R252 Cramp and spasm: Secondary | ICD-10-CM | POA: Diagnosis not present

## 2022-10-11 MED ORDER — OXYCODONE HCL 5 MG PO TABS: 5.0000 mg | ORAL_TABLET | ORAL | 0 refills | Status: DC | PRN

## 2022-10-11 NOTE — Therapy (Signed)
OUTPATIENT PHYSICAL THERAPY TREATMENT NOTE   Patient Name: Jason Stokes MRN: [id number] DOB:[birth year], 62 y.o., male Today's Date: 10/11/2022  PCP: Charlott Rakes, MD   REFERRING PROVIDER: Ashok Pall, MD  END OF SESSION:   PT End of Session - 10/11/22 0957     Visit Number 10    Number of Visits 16    Date for PT Re-Evaluation 10/14/22    Authorization Type MEDICAID [place] ACCESS    Authorization Time Period Approved 12 PT visits from 09/20/22-10/31/22    Authorization - Visit Number 6    Authorization - Number of Visits 12    Progress Note Due on Visit 10    PT Start Time 0955    PT Stop Time 1048    PT Time Calculation (min) 53 min               Past Medical History:  Diagnosis Date   Anemia    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[place])    a.) s/p embolization 08/19/2020: 29 mm RIGHT internal iliac artery aneurysm   Anxiety    Aortic atherosclerosis (HCC)    Bilateral carpal tunnel syndrome 01/10/2018   Bipolar disorder ([place])    Chorioretinal inflammation of both eyes    a.) on azothioprine   Chronic lower back pain    Coronary artery calcification seen on CT scan    a.) cCTA 04/21/2022: Ca score 20.6 (61st percentile for age/sex match control)   DDD (degenerative disc disease), cervical    Depression    Diastolic dysfunction    a.) TTE 01/13/2021: EF 60-65%, mod LVH, triv MR, G1DD   GERD (gastroesophageal reflux disease)    Hepatic steatosis    History of alcohol abuse    History of nuclear stress test    Myoview 10/16: EF 50%, diaphragmatic attenuation, no ischemia, low risk   Hypertension    Lacunar infarction ([place]) 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   a.) CT head 12/25/2012 --> RIGHT basal ganglia hypoattenuation related to remote lacunar infarct   Lipoma    Long term (current) use of anticoagulants    a.) apixaban   Long-term current use of immunomodulator    a.) on azothioprine for peripheral focal chorioretinal inflammation (both eyes)   Marijuana use     Migraine    Moderate persistent asthma with acute exacerbation 05/02/2018   PAF (paroxysmal atrial fibrillation) ([place]) 03/27/2015   a.) CHA2DS2VASc = 5 (HTN, CVA x2, vascular disease history, T2DM);  b.) s/p ablation 10/02/2015; c.) s/p ablation 12/10/2015; d.) s/p DCCV (200 J x 1) 12/11/2015; e.) s/p ablation 04/28/2022; f.) rate/rhythm maintained on oral diltiazem + carvedilol; chronically anticoagulated with apixaban   Pilonidal cyst    Schizophrenia ([place])    Sciatica neuralgia    T2DM (type 2 diabetes mellitus) ([place])    Thoracic aortic ectasia ([place]) 01/13/2021   a.) TTE 01/13/2021: Ao root 38 mm, asc Ao 39 mm   Past Surgical History:  Procedure Laterality Date   ATRIAL FIBRILLATION ABLATION  09/22/2015   ATRIAL FIBRILLATION ABLATION N/A 04/28/2022   Procedure: ATRIAL FIBRILLATION ABLATION;  Surgeon: Constance Haw, MD;  Location: [facility];  Service: Cardiovascular;  Laterality: N/A;   CATARACT EXTRACTION Bilateral    CYST EXCISION  1996-97   surgery back of head    ELECTROPHYSIOLOGIC STUDY N/A 09/22/2015   Procedure: Atrial Fibrillation Ablation;  Surgeon: Will [name], MD;  Location: [facility];  Service: Cardiovascular;  Laterality: N/A;   ELECTROPHYSIOLOGIC STUDY N/A  12/10/2015   Procedure: Atrial Fibrillation Ablation;  Surgeon: Will [name], MD;  Location: [facility];  Service: Cardiovascular;  Laterality: N/A;   ELECTROPHYSIOLOGIC STUDY N/A 12/11/2015   Procedure: Cardioversion;  Surgeon: Will [name], MD;  Location: [facility];  Service: Cardiovascular;  Laterality: N/A;   EMBOLIZATION Right 08/19/2020   Procedure: EMBOLIZATION;  Surgeon: Cherre Robins, MD;  Location: [facility];  Service: Cardiovascular;  Laterality: Right;  hypogastric   EXCISION MASS HEAD N/A 01/06/2017   Procedure: EXCISION MASS FOREHEAD;  Surgeon: Irene Limbo, MD;  Location: [facility];  Service: Plastics;   Laterality: N/A;   EXCISION MASS UPPER EXTREMETIES Right 08/08/2022   Procedure: EXCISION MASS RIGHT FOREARM;  Surgeon: Leanora Cover, MD;  Location: [facility];  Service: Orthopedics;  Laterality: Right;  45 MIN   GANGLION CYST EXCISION Left    INTERCOSTAL NERVE BLOCK  07/19/2003   KNEE ARTHROSCOPY Right 07/18/2014   MASS EXCISION N/A 01/25/2021   Procedure: EXCISION SUBCUTANEOUS VS SUBFASCIAL MASS TORSO 3CM;  Surgeon: Irene Limbo, MD;  Location: [facility];  Service: Plastics;  Laterality: N/A;   PILONIDAL CYST EXCISION N/A 09/13/2022   Procedure: CYST EXCISION PILONIDAL EXTENSIVE;  Surgeon: Olean Ree, MD;  Location: [facility];  Service: General;  Laterality: N/A;   Patient Active Problem List   Diagnosis Date Noted   Pilonidal cyst 09/13/2022   Aortic atherosclerosis ([place]) 06/07/2022   Hypercoagulable state due to paroxysmal atrial fibrillation ([place]) 03/23/2021   Type 2 diabetes mellitus with hyperglycemia, without long-term current use of insulin ([place]) 02/10/2021   Neck pain on left side 03/06/2019   Musculoskeletal chest pain 07/24/2018   Asthma, mild intermittent 05/02/2018   Allergic rhinitis caused by mold 05/02/2018   Tobacco use 05/02/2018   Bilateral carpal tunnel syndrome 01/10/2018   Stroke ([place])    Schizophrenia ([place])    Migraine    History of nuclear stress test    History of alcohol abuse    GERD (gastroesophageal reflux disease)    Dysrhythmia    Chronic lower back pain    Anxiety    Arthritis of knee, right 04/12/2017   Bipolar disorder ([place]) 04/03/2017   Lipoma of forehead 09/22/2016   Trigger ring finger of right hand 06/22/2016   Paroxysmal atrial fibrillation (HCC)    AF (atrial fibrillation) ([place]) 12/10/2015   Eczema 07/10/2015   History of CVA (cerebrovascular accident) 05/04/2015   Tear of medial meniscus of right knee 03/18/2015   Chronic pain of right knee 03/12/2015   Other and unspecified hyperlipidemia  11/25/2013   Nasal congestion 11/25/2013   Sinusitis, chronic 05/09/2013   Chronic low back pain 10/12/2012   Lumbar radiculopathy 10/12/2012   Hypertension 10/12/2012   Depression 10/12/2012   Insomnia 10/12/2012    REFERRING DIAG: M47.22 (ICD-10-CM) - Other spondylosis with radiculopathy, cervical region   THERAPY DIAG:  Cervicalgia  Abnormal posture  Muscle weakness (generalized)  Rationale for Evaluation and Treatment rehabilitation  PERTINENT HISTORY:  Migraines, bipolar disorder, hx of bilat carpal tunnel, PAF  PRECAUTIONS: None   WEIGHT BEARING RESTRICTIONS: No  SUBJECTIVE:  SUBJECTIVE STATEMENT: I am doing okay today. This therapy is really helping me. My wife is ordering the theracane today. I need it. I want to try dry needling once more time but with the electricity.     PAIN:  Are you having pain? Yes: NPRS scale: 7/10 Pain location: neck pain R>L Pain description: Throb, ache Aggravating factors: Turning my head  Relieving factors: Not sure, heat and cold packs don't seem to help   OBJECTIVE: (objective measures completed at initial evaluation unless otherwise dated)   DIAGNOSTIC FINDINGS:                        CT scan 07/27/22 IMPRESSION: 1. No acute fracture or traumatic listhesis of the cervical spine. 2. 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   PATIENT SURVEYS:  NDI=41, 82% complete disability   COGNITION: Overall cognitive status: Within functional limits for tasks assessed   SENSATION: WFL   POSTURE: rounded shoulders and forward head   PALPATION: TTP of the cervical paraspinals and upper trap with increased muscle tension, L>R   CERVICAL ROM:    Active ROM  A/PROM (deg) eval AROM 09/16/22 AROM 10/04/22  Flexion 35 pulling posterior bilat 32   Extension 40 pulling R lateral 40   Right lateral flexion 15 pulling bilat 15   Left lateral flexion 15 " needs to pop" 15   Right rotation 30 puling L lateral 30 30  Left rotation 40 pulling R lateral 38 42   (Blank rows = not tested)   UPPER EXTREMITY ROM: Passively able to lift arms to 110d bilat before report of pain as limitation Active ROM Right eval Left eval PROM LEFT 09/29/22 Right 10/11/22   Shoulder flexion 90 90 145 140  Shoulder extension        Shoulder abduction     145 120  Shoulder adduction        Shoulder extension        Shoulder internal rotation     50   Shoulder external rotation     55   Elbow flexion        Elbow extension        Wrist flexion        Wrist extension        Wrist ulnar deviation        Wrist radial deviation        Wrist pronation        Wrist supination         (Blank rows = not tested)   UPPER EXTREMITY MMT: Grossly 4+ to 5/5 and equal bilat. Neck weakness noted per poor posture MMT Right eval Left eval  Shoulder flexion      Shoulder extension      Shoulder abduction      Shoulder adduction      Shoulder extension      Shoulder internal rotation      Shoulder external rotation      Middle trapezius      Lower trapezius      Elbow flexion      Elbow extension      Wrist flexion      Wrist extension      Wrist ulnar deviation      Wrist radial deviation      Wrist pronation      Wrist supination  Grip strength       (Blank rows = not tested)   CERVICAL SPECIAL TESTS:  Spurling's test positive; Distraction test positive   FUNCTIONAL TESTS:  NT   TODAY'S TREATMENT:  [facility] Adult PT Treatment:                                                DATE: 10/11/22 Therapeutic Exercise: DNF on towel x 10 DNF with 10  arm circles each way Supine head on ball cervical rotations and head nods  Head lift 10 sec x 5  Supine head on ball  with head press 5 sec x 10  Manual Therapy: Supine TPR to upper traps bilat Supine passive cervical stretches and manual distraction. Modalities: Estim (IFC) with HMP x 15 minutes to tolerance  Self Care: Theracane for self TPR  [facility] Adult PT Treatment:                                                DATE: 10/06/22 Therapeutic Exercise: DNF on towel x 10 DNF with 10  arm circles each way Supine head on ball cervical rotations and head nods  Head lift 10 sec x 5  Supine head on ball with head press 5 sec x 10  Manual Therapy: Supine TPR to upper traps bilat Supine passive cervical stretches and manual distraction.  Supine passive cervical rotation is >50 degrees each way   Modalities: Estim (IFC) with HMP x 15 minutes to tolerance  Self Care: Tennis ball vs theracane  [facility] Adult PT Treatment:                                                DATE: 10/04/22 Therapeutic Exercise: DNF 5 sec x 10 Supine head on ball cervical rotations  and head nods  Supine head on ball head press 5 sec x 10  Manual Therapy: Seated STW to bilateral upper traps and cervical paraspinals. Supine passive cervical stretches and manual distraction.  Supine passive cervical rotation is >50 degrees each way   Modalities: HMP to cervical x 15 minutes    [facility] Adult PT Treatment:                                                DATE: 09/29/22 Therapeutic Exercise: Seated upper trap and levator stretches Seated chin tuck Supine clasped hand pullovers x 5 for self stretch  Manual Therapy: STW to bilateral upper trap and persicap in prone Modalties: MMP Cervical x 15 minutes Trigger Point Dry Needling Treatment:Performed by Gar Ponto, PT Pre-treatment instruction: Patient instructed on dry needling rationale, procedures, and possible side effects including pain during treatment (achy,cramping feeling), bruising, drop of blood, lightheadedness, nausea, sweating. Patient Consent Given: Yes Education  handout provided: Yes Muscles treated: Bilat upper trap  Needle size and number: .30x59mm x 2 Electrical stimulation performed: No Parameters: N/A Treatment response/outcome: Twitch response elicited Post-treatment instructions: Patient instructed to expect possible mild to moderate  muscle soreness later today and/or tomorrow. Patient instructed in methods to reduce muscle soreness and to continue prescribed HEP. If patient was dry needled over the lung field, patient was instructed on signs and symptoms of pneumothorax and, however unlikely, to see immediate medical attention should they occur. Patient was also educated on signs and symptoms of infection and to seek medical attention should they occur. Patient verbalized understanding of these instructions and education.     [facility] Adult PT Treatment:                                                DATE: 09/22/22 Therapeutic Exercise: Supine: shoulder press isometric  Supine: ball under head: cervical rotation and nods Supine chest press with dowel Supine pullovers with dowel in comfortable ROM  Supine DNF 15 sec, 25 sec  Supine chin tuck with arm circles  Supine chin tuck with horizontal abduction  Manual Therapy: Cervical manual traction  Modalities: HMP to cervical x 10 minutes    [facility] Adult PT Treatment:                                                DATE: 09/20/22 Therapeutic Exercise: Supine cervical retraction x10 3"  Supine cervical lift offs x10 5" Seated Cervical Rotation AROM 3 reps - 15 hold Gentle Levator Scapulae Stretch 3 reps - 15 hold Supine shoulder ER 2x10 RTB Shoulder extension RTB 2x10 Manual Therapy: STM and DTM to the cervical paraspinals and upper trap Cervical manual traction- pt reports good relief Skilled palapation to identify TrPs and taut muscle bands Trigger Point Dry Needling Treatment: Pre-treatment instruction: Patient instructed on dry needling rationale, procedures, and possible side effects  including pain during treatment (achy,cramping feeling), bruising, drop of blood, lightheadedness, nausea, sweating. Patient Consent Given: Yes Education handout provided: Yes Muscles treated: Bilat upper trap  Needle size and number: .30x90mm x 2 Electrical stimulation performed: No Parameters: N/A Treatment response/outcome: Twitch response elicited Post-treatment instructions: Patient instructed to expect possible mild to moderate muscle soreness later today and/or tomorrow. Patient instructed in methods to reduce muscle soreness and to continue prescribed HEP. If patient was dry needled over the lung field, patient was instructed on signs and symptoms of pneumothorax and, however unlikely, to see immediate medical attention should they occur. Patient was also educated on signs and symptoms of infection and to seek medical attention should they occur. Patient verbalized understanding of these instructions and education.   [facility] Adult PT Treatment:                                                DATE: 09/16/22 Therapeutic Exercise: Seated Cervical Retraction 10 reps - 3 hold Seated Scapular Retraction  10 reps - 3 hold Seated Cervical Rotation AROM 3 reps - 15 hold Gentle Levator Scapulae Stretch 3 reps - 15 hold Supine shoulder ER 3x10 GTB Standing Row GTB 3x10 Shoulder extension GTB 3x10 Self Care: Use of heat and cold packs, and TENs unit set up for pain management Explanation of anatomy and pathophysiology for rationale of possible cause for pain and  dysfunction and the purpose of PT  . PATIENT EDUCATION:  Education details: Eval findings, POC, HEP, self care  Person educated: Patient Education method: Explanation, Demonstration, Tactile cues, Verbal cues, and Handouts Education comprehension: verbalized understanding, returned demonstration, verbal cues required, and tactile cues required   HOME EXERCISE PROGRAM: Access Code: [email or web address] URL: [email or web address] Date:  09/07/2022 Prepared by: Gar Ponto  Exercises - Seated Cervical Retraction  - 6 x daily - 7 x weekly - 1 sets - 3-5 reps - 3 hold - Supine Deep Neck Flexor Training - Hold  - 1 x daily - 7 x weekly - 1 sets - 10 reps - 5 hold - Seated Scapular Retraction  - 6 x daily - 7 x weekly - 1 sets - 3-5 reps - 3 hold - Seated Cervical Rotation AROM  - 6 x daily - 7 x weekly - 1 sets - 3 reps - 15 hold - Gentle Levator Scapulae Stretch  - 1 x daily - 7 x weekly - 1 sets - 3 reps - 15 hold - Standing Row with Anchored Resistance  - 1 x daily - 7 x weekly - 3 sets - 10 reps - Shoulder extension with resistance - Neutral  - 1 x daily - 7 x weekly - 3 sets - 10 reps   ASSESSMENT:   CLINICAL IMPRESSION: Pt reports 9-10/10 cervical pain and short term relief after each PT session. He responded well to manual TPR however declines more TPDN as his last response was not as favorable. Introduced theracane vs tennis ball pressure for self TPR. He was given information on how to purchase a theracane. Estim and Hmp applied to day to reduce pain.       OBJECTIVE IMPAIRMENTS: decreased activity tolerance, decreased ROM, decreased strength, increased muscle spasms, impaired flexibility, impaired UE functional use, postural dysfunction, obesity, and pain.    ACTIVITY LIMITATIONS: carrying, lifting, bending, sitting, sleeping, dressing, reach over head, and caring for others   PARTICIPATION LIMITATIONS: meal prep, cleaning, laundry, and driving   PERSONAL FACTORS: Age, Fitness, Past/current experiences, Time since onset of injury/illness/exacerbation, and 1 comorbidity: high BMI  are also affecting patient's functional outcome.    REHAB POTENTIAL: Good   CLINICAL DECISION MAKING: Evolving/moderate complexity   EVALUATION COMPLEXITY: Moderate     GOALS:     SHORT TERM GOALS: Target date: 09/16/22   Pt will be Ind in an initial HEP Baseline: initial Goal status: MET 09/16/22   2.  Pt will voice  understanding of measures to assist in pain reduction Baseline: Initial  Goal status: MET 09/16/22, use of heat and cold packs, TENs unit   LONG TERM GOALS: Target date: 10/14/22   Pt will be Ind in a final HEP to maintain achieved LOF  Baseline: initiated Goal status: Ongoing   2.  Pt will be able to demonstrate proper sitting posture Baseline: Poor posture 10/04/22: poor posture Goal status: Ongoing   3.  Increase cervical side bending and and rotation for improved neck function, especially with driving Baseline: see flow sheets 10/11/22: reports improvement in ability to turn head when driving Goal status: Ongoing   4.  Pt will demonstrate DNF endurance of 20" with a single attempt Baseline: NT 09/22/22: 25 sec Goal status: MET   5.  Pt will report neck pain of 4/10 or less for improved neck function and QOL Baseline: 8/10 Goal status: Ongoing     PLAN:   PT FREQUENCY: 2x/week   PT DURATION: 8 weeks  PLANNED INTERVENTIONS: Therapeutic exercises, Therapeutic activity, Patient/Family education, Self Care, Dry Needling, Electrical stimulation, Spinal manipulation, Spinal mobilization, Cryotherapy, Moist heat, Taping, Traction, Ultrasound, Ionotophoresis 4mg /ml Dexamethasone, Manual therapy, and Re-evaluation   PLAN FOR NEXT SESSION: Did he get theracane? Review NDI; assess response to HEP; progress therex as indicated; use of modalities, manual therapy; and TPDN as indicated (does not want anymore TPDN).   Hessie Diener, PTA 10/11/22 10:40 AM Phone: [phone] Fax: [phone]

## 2022-10-12 DIAGNOSIS — I480 Paroxysmal atrial fibrillation: Secondary | ICD-10-CM | POA: Diagnosis not present

## 2022-10-12 NOTE — Therapy (Signed)
OUTPATIENT PHYSICAL THERAPY TREATMENT NOTE/Re-Cert   Patient Name: Jason Stokes/Er-ecrt MRN: [id number] DOB:March 26, 1961, 62 y.o., male Today's Date: 10/13/2022  PCP: [place]ott Rakes, MD   REFERRING PROVIDER: Ashok Pall, MD  END OF SESSION:   PT End of Session - 10/13/22 1047     Visit Number 11    Number of Visits 16    Date for PT Re-Evaluation 12/02/22    Authorization Type MEDICAID [place] ACCESS    Authorization Time Period Approved 12 PT visits from 09/20/22-10/31/22    Authorization - Visit Number 7    Authorization - Number of Visits 12    PT Start Time [id number]    PT Stop Time [id number]    PT Time Calculation (min) 42 min    Activity Tolerance Patient limited by pain    Behavior During Therapy [facility] for tasks assessed/performed               Past Medical History:  Diagnosis Date   Anemia    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[place])    a.) s/p embolization 08/19/2020: 29 mm RIGHT internal iliac artery aneurysm   Anxiety    Aortic atherosclerosis (HCC)    Bilateral carpal tunnel syndrome 01/10/2018   Bipolar disorder ([place])    Chorioretinal inflammation of both eyes    a.) on azothioprine   Chronic lower back pain    Coronary artery calcification seen on CT scan    a.) cCTA 04/21/2022: Ca score 20.6 (61st percentile for age/sex match control)   DDD (degenerative disc disease), cervical    Depression    Diastolic dysfunction    a.) TTE 01/13/2021: EF 60-65%, mod LVH, triv MR, G1DD   GERD (gastroesophageal reflux disease)    Hepatic steatosis    History of alcohol abuse    History of nuclear stress test    Myoview 10/16: EF 50%, diaphragmatic attenuation, no ischemia, low risk   Hypertension    Lacunar infarction ([place]) 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   a.) CT head 12/25/2012 --> RIGHT basal ganglia hypoattenuation related to remote lacunar infarct   Lipoma    Long term (current) use of anticoagulants    a.) apixaban   Long-term current use of immunomodulator    a.) on  azothioprine for peripheral focal chorioretinal inflammation (both eyes)   Marijuana use    Migraine    Moderate persistent asthma with acute exacerbation 05/02/2018   PAF (paroxysmal atrial fibrillation) ([place]) 03/27/2015   a.) CHA2DS2VASc = 5 (HTN, CVA x2, vascular disease history, T2DM);  b.) s/p ablation 10/02/2015; c.) s/p ablation 12/10/2015; d.) s/p DCCV (200 J x 1) 12/11/2015; e.) s/p ablation 04/28/2022; f.) rate/rhythm maintained on oral diltiazem + carvedilol; chronically anticoagulated with apixaban   Pilonidal cyst    Schizophrenia ([place])    Sciatica neuralgia    T2DM (type 2 diabetes mellitus) ([place])    Thoracic aortic ectasia ([place]) 01/13/2021   a.) TTE 01/13/2021: Ao root 38 mm, asc Ao 39 mm   Past Surgical History:  Procedure Laterality Date   ATRIAL FIBRILLATION ABLATION  09/22/2015   ATRIAL FIBRILLATION ABLATION N/A 04/28/2022   Procedure: ATRIAL FIBRILLATION ABLATION;  Surgeon: Constance Haw, MD;  Location: [place] [facility];  Service: Cardiovascular;  Laterality: N/A;   CATARACT EXTRACTION Bilateral    CYST EXCISION  1996-97   surgery back of head    ELECTROPHYSIOLOGIC STUDY N/A 09/22/2015   Procedure: Atrial Fibrillation Ablation;  Surgeon: Will [name], MD;  Location: [place] [facility];  Service: Cardiovascular;  Laterality: N/A;   ELECTROPHYSIOLOGIC STUDY N/A 12/10/2015   Procedure: Atrial Fibrillation Ablation;  Surgeon: Will [name], MD;  Location: [place] [facility];  Service: Cardiovascular;  Laterality: N/A;   ELECTROPHYSIOLOGIC STUDY N/A 12/11/2015   Procedure: Cardioversion;  Surgeon: Will [name], MD;  Location: [place] [facility];  Service: Cardiovascular;  Laterality: N/A;   EMBOLIZATION Right 08/19/2020   Procedure: EMBOLIZATION;  Surgeon: Cherre Robins, MD;  Location: [place] [facility];  Service: Cardiovascular;  Laterality: Right;  hypogastric   EXCISION MASS HEAD N/A 01/06/2017   Procedure: EXCISION MASS FOREHEAD;   Surgeon: Irene Limbo, MD;  Location: [place];  Service: Plastics;  Laterality: N/A;   EXCISION MASS UPPER EXTREMETIES Right 08/08/2022   Procedure: EXCISION MASS RIGHT FOREARM;  Surgeon: Leanora Cover, MD;  Location: [place];  Service: Orthopedics;  Laterality: Right;  45 MIN   GANGLION CYST EXCISION Left    INTERCOSTAL NERVE BLOCK  07/19/2003   KNEE ARTHROSCOPY Right 07/18/2014   MASS EXCISION N/A 01/25/2021   Procedure: EXCISION SUBCUTANEOUS VS SUBFASCIAL MASS TORSO 3CM;  Surgeon: Irene Limbo, MD;  Location: [place];  Service: Plastics;  Laterality: N/A;   PILONIDAL CYST EXCISION N/A 09/13/2022   Procedure: CYST EXCISION PILONIDAL EXTENSIVE;  Surgeon: Olean Ree, MD;  Location: [facility];  Service: General;  Laterality: N/A;   Patient Active Problem List   Diagnosis Date Noted   Pilonidal cyst 09/13/2022   Aortic atherosclerosis ([place]) 06/07/2022   Hypercoagulable state due to paroxysmal atrial fibrillation ([place]) 03/23/2021   Type 2 diabetes mellitus with hyperglycemia, without long-term current use of insulin ([place]) 02/10/2021   Neck pain on left side 03/06/2019   Musculoskeletal chest pain 07/24/2018   Asthma, mild intermittent 05/02/2018   Allergic rhinitis caused by mold 05/02/2018   Tobacco use 05/02/2018   Bilateral carpal tunnel syndrome 01/10/2018   Stroke ([place])    Schizophrenia ([place])    Migraine    History of nuclear stress test    History of alcohol abuse    GERD (gastroesophageal reflux disease)    Dysrhythmia    Chronic lower back pain    Anxiety    Arthritis of knee, right 04/12/2017   Bipolar disorder ([place]) 04/03/2017   Lipoma of forehead 09/22/2016   Trigger ring finger of right hand 06/22/2016   Paroxysmal atrial fibrillation (HCC)    AF (atrial fibrillation) ([place]) 12/10/2015   Eczema 07/10/2015   History of CVA (cerebrovascular accident) 05/04/2015   Tear of medial meniscus of right knee  03/18/2015   Chronic pain of right knee 03/12/2015   Other and unspecified hyperlipidemia 11/25/2013   Nasal congestion 11/25/2013   Sinusitis, chronic 05/09/2013   Chronic low back pain 10/12/2012   Lumbar radiculopathy 10/12/2012   Hypertension 10/12/2012   Depression 10/12/2012   Insomnia 10/12/2012    REFERRING DIAG: M47.22 (ICD-10-CM) - Other spondylosis with radiculopathy, cervical region   THERAPY DIAG:  Cervicalgia  Abnormal posture  Muscle weakness (generalized)  Cramp and spasm  Rationale for Evaluation and Treatment rehabilitation  PERTINENT HISTORY:  Migraines, bipolar disorder, hx of bilat carpal tunnel, PAF  PRECAUTIONS: None   WEIGHT BEARING RESTRICTIONS: No  SUBJECTIVE:  SUBJECTIVE STATEMENT: Therapy is helping a whole lot. The sharp pain on the L is much better.  PAIN:  Are you having pain? Yes: NPRS scale: R 8/10, L 6/10  Pain location: neck pain R>L Pain description: Throb, ache Aggravating factors: Turning my head  Relieving factors: Not sure, heat and cold packs don't seem to help   OBJECTIVE: (objective measures completed at initial evaluation unless otherwise dated)   DIAGNOSTIC FINDINGS:                        CT scan 07/27/22 IMPRESSION: 1. No acute fracture or traumatic listhesis of the cervical spine. 2. 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   PATIENT SURVEYS:  NDI=41, 82% complete disability   COGNITION: Overall cognitive status: Within functional limits for tasks assessed   SENSATION: WFL   POSTURE: rounded shoulders and forward head   PALPATION: TTP of the cervical paraspinals and upper trap with increased muscle tension, L>R   CERVICAL ROM:     Active ROM A/PROM (deg) eval AROM 09/16/22 AROM 10/04/22 AROM 10/13/22  Flexion 35 pulling posterior bilat 32    Extension 40 pulling R lateral 40    Right lateral flexion 15 pulling bilat 15    Left lateral flexion 15 " needs to pop" 15    Right rotation 30 puling L lateral 30 30 40  Left rotation 40 pulling R lateral 38 42 50   (Blank rows = not tested)   UPPER EXTREMITY ROM: Passively able to lift arms to 110d bilat before report of pain as limitation Active ROM Right eval Left eval PROM LEFT 09/29/22 Right 10/11/22   Shoulder flexion 90 90 145 140  Shoulder extension        Shoulder abduction     145 120  Shoulder adduction        Shoulder extension        Shoulder internal rotation     50   Shoulder external rotation     55   Elbow flexion        Elbow extension        Wrist flexion        Wrist extension        Wrist ulnar deviation        Wrist radial deviation        Wrist pronation        Wrist supination         (Blank rows = not tested)   UPPER EXTREMITY MMT: Grossly 4+ to 5/5 and equal bilat. Neck weakness noted per poor posture MMT Right eval Left eval  Shoulder flexion      Shoulder extension      Shoulder abduction      Shoulder adduction      Shoulder extension      Shoulder internal rotation      Shoulder external rotation      Middle trapezius      Lower trapezius      Elbow flexion      Elbow extension      Wrist flexion      Wrist extension      Wrist ulnar deviation      Wrist radial deviation      Wrist pronation      Wrist supination      Grip strength       (Blank  rows = not tested)   CERVICAL SPECIAL TESTS:  Spurling's test positive; Distraction test positive   FUNCTIONAL TESTS:  NT   TODAY'S TREATMENT:  [facility] Adult PT Treatment:                                                DATE: 10/13/22 Therapeutic Exercise: Supine cervical retraction x10 3"  Supine cervical lift offs x10 5" Seated Cervical Rotation AROM 3 reps - 15  hold Gentle Levator Scapulae Stretch 3 reps - 15 hold Manual Therapy: STM and DTM to the cervical paraspinals and upper trap Cervical manual traction Skilled palapation to identify TrPs and taut muscle bands Trigger Point Dry Needling Treatment: Pre-treatment instruction: Patient instructed on dry needling rationale, procedures, and possible side effects including pain during treatment (achy,cramping feeling), bruising, drop of blood, lightheadedness, nausea, sweating. Patient Consent Given: Yes Education handout provided: Yes Muscles treated: Bilat upper trap and cervical paraspinals and multifidi Needle size and number: .30x63mm x 4 Electrical stimulation performed: Yes Parameters: mA 20 pps, intensity as tolerated Treatment response/outcome: Twitch response elicited Post-treatment instructions: Patient instructed to expect possible mild to moderate muscle soreness later today and/or tomorrow. Patient instructed in methods to reduce muscle soreness and to continue prescribed HEP. If patient was dry needled over the lung field, patient was instructed on signs and symptoms of pneumothorax and, however unlikely, to see immediate medical attention should they occur. Patient was also educated on signs and symptoms of infection and to seek medical attention should they occur. Patient verbalized understanding of these instructions and education.   [facility] [place] Adult PT Treatment:                                                DATE: 10/11/22 Therapeutic Exercise: DNF on towel x 10 DNF with 10  arm circles each way Supine head on ball cervical rotations and head nods  Head lift 10 sec x 5  Supine head on ball with head press 5 sec x 10  Manual Therapy: Supine TPR to upper traps bilat Supine passive cervical stretches and manual distraction. Modalities: Estim (IFC) with HMP x 15 minutes to tolerance  Self Care: Theracane for self TPR  [facility] Adult PT Treatment:                                                 DATE: 10/06/22 Therapeutic Exercise: DNF on towel x 10 DNF with 10  arm circles each way Supine head on ball cervical rotations and head nods  Head lift 10 sec x 5  Supine head on ball with head press 5 sec x 10  Manual Therapy: Supine TPR to upper traps bilat Supine passive cervical stretches and manual distraction.  Supine passive cervical rotation is >50 degrees each way   Modalities: Estim (IFC) with HMP x 15 minutes to tolerance  Self Care: Tennis ball vs theracane  . PATIENT EDUCATION:  Education details: Eval findings, POC, HEP, self care  Person educated: Patient Education method: Explanation, Demonstration, Tactile cues, Verbal cues, and Handouts Education comprehension: verbalized understanding, returned demonstration,  verbal cues required, and tactile cues required   HOME EXERCISE PROGRAM: Access Code: [email or web address] URL: [email or web address] Date: 09/07/2022 Prepared by: Gar Ponto  Exercises - Seated Cervical Retraction  - 6 x daily - 7 x weekly - 1 sets - 3-5 reps - 3 hold - Supine Deep Neck Flexor Training - Hold  - 1 x daily - 7 x weekly - 1 sets - 10 reps - 5 hold - Seated Scapular Retraction  - 6 x daily - 7 x weekly - 1 sets - 3-5 reps - 3 hold - Seated Cervical Rotation AROM  - 6 x daily - 7 x weekly - 1 sets - 3 reps - 15 hold - Gentle Levator Scapulae Stretch  - 1 x daily - 7 x weekly - 1 sets - 3 reps - 15 hold - Standing Row with Anchored Resistance  - 1 x daily - 7 x weekly - 3 sets - 10 reps - Shoulder extension with resistance - Neutral  - 1 x daily - 7 x weekly - 3 sets - 10 reps   ASSESSMENT:   CLINICAL IMPRESSION: PT was completed for manual therapy f/b TPDN with mA estim to the bilat upper traps and cervical paraspinals/multifidi. Pt then completed cervical mobility and postural strengthening. Pt reported decreased pain following the session. Pt cervical rotation was reassessed and was found improved. Pt is making progress re:  pain level and mobility. Pt tolerated PT today without adverse effects. Pt will continue to benefit from skilled PT to address impairments for improved neck function less pain.  OBJECTIVE IMPAIRMENTS: decreased activity tolerance, decreased ROM, decreased strength, increased muscle spasms, impaired flexibility, impaired UE functional use, postural dysfunction, obesity, and pain.    ACTIVITY LIMITATIONS: carrying, lifting, bending, sitting, sleeping, dressing, reach over head, and caring for others   PARTICIPATION LIMITATIONS: meal prep, cleaning, laundry, and driving   PERSONAL FACTORS: Age, Fitness, Past/current experiences, Time since onset of injury/illness/exacerbation, and 1 comorbidity: high BMI  are also affecting patient's functional outcome.    REHAB POTENTIAL: Good   CLINICAL DECISION MAKING: Evolving/moderate complexity   EVALUATION COMPLEXITY: Moderate     GOALS:     SHORT TERM GOALS: Target date: 09/16/22   Pt will be Ind in an initial HEP Baseline: initial Goal status: MET 09/16/22   2.  Pt will voice understanding of measures to assist in pain reduction Baseline: Initial  Goal status: MET 09/16/22, use of heat and cold packs, TENs unit   LONG TERM GOALS: Target date: 12/02/22   Pt will be Ind in a final HEP to maintain achieved LOF  Baseline: initiated Goal status: Ongoing   2.  Pt will be able to demonstrate proper sitting posture Baseline: Poor posture 10/04/22: poor posture Goal status: Ongoing   3.  Increase cervical side bending and and rotation for improved neck function, especially with driving Baseline: see flow sheets 10/11/22: reports improvement in ability to turn head when driving [id number]; see flow sheets Goal status: Improved   4.  Pt will demonstrate DNF endurance of 20" with a single attempt Baseline: NT 09/22/22: 25 sec Goal status: MET   5.  Pt will report neck pain of 4/10 or less for improved neck function and QOL Baseline: 8/10 Goal status:  Improved     PLAN:   PT FREQUENCY: 2x/week   PT DURATION: 8 weeks   PLANNED INTERVENTIONS: Therapeutic exercises, Therapeutic activity, Patient/Family education, Self Care, Dry Needling, Electrical stimulation, Spinal manipulation, Spinal mobilization, Cryotherapy, Moist heat,  Taping, Traction, Ultrasound, Ionotophoresis 4mg /ml Dexamethasone, Manual therapy, and Re-evaluation   PLAN FOR NEXT SESSION: Did he get theracane? Review NDI; assess response to HEP; progress therex as indicated; use of modalities, manual therapy; and TPDN as indicated (does not want anymore TPDN).   Mekiah Cambridge MS, PT 10/13/22 1:52 PM

## 2022-10-13 ENCOUNTER — Ambulatory Visit (INDEPENDENT_AMBULATORY_CARE_PROVIDER_SITE_OTHER): Payer: Medicaid Other

## 2022-10-13 ENCOUNTER — Ambulatory Visit: Payer: Medicaid Other

## 2022-10-13 DIAGNOSIS — M6281 Muscle weakness (generalized): Secondary | ICD-10-CM | POA: Diagnosis not present

## 2022-10-13 DIAGNOSIS — J309 Allergic rhinitis, unspecified: Secondary | ICD-10-CM

## 2022-10-13 DIAGNOSIS — I480 Paroxysmal atrial fibrillation: Secondary | ICD-10-CM | POA: Diagnosis not present

## 2022-10-13 DIAGNOSIS — M542 Cervicalgia: Secondary | ICD-10-CM | POA: Diagnosis not present

## 2022-10-13 DIAGNOSIS — R293 Abnormal posture: Secondary | ICD-10-CM

## 2022-10-13 DIAGNOSIS — R252 Cramp and spasm: Secondary | ICD-10-CM

## 2022-10-14 DIAGNOSIS — I480 Paroxysmal atrial fibrillation: Secondary | ICD-10-CM | POA: Diagnosis not present

## 2022-10-15 DIAGNOSIS — I480 Paroxysmal atrial fibrillation: Secondary | ICD-10-CM | POA: Diagnosis not present

## 2022-10-16 DIAGNOSIS — I480 Paroxysmal atrial fibrillation: Secondary | ICD-10-CM | POA: Diagnosis not present

## 2022-10-17 DIAGNOSIS — I480 Paroxysmal atrial fibrillation: Secondary | ICD-10-CM | POA: Diagnosis not present

## 2022-10-18 DIAGNOSIS — I480 Paroxysmal atrial fibrillation: Secondary | ICD-10-CM | POA: Diagnosis not present

## 2022-10-19 ENCOUNTER — Ambulatory Visit (INDEPENDENT_AMBULATORY_CARE_PROVIDER_SITE_OTHER): Payer: Medicaid Other

## 2022-10-19 DIAGNOSIS — J309 Allergic rhinitis, unspecified: Secondary | ICD-10-CM

## 2022-10-19 DIAGNOSIS — I480 Paroxysmal atrial fibrillation: Secondary | ICD-10-CM | POA: Diagnosis not present

## 2022-10-20 ENCOUNTER — Ambulatory Visit: Payer: Medicaid Other | Attending: Neurosurgery | Admitting: Physical Therapy

## 2022-10-20 ENCOUNTER — Encounter: Payer: Self-pay | Admitting: Physical Therapy

## 2022-10-20 DIAGNOSIS — I480 Paroxysmal atrial fibrillation: Secondary | ICD-10-CM | POA: Diagnosis not present

## 2022-10-20 DIAGNOSIS — R252 Cramp and spasm: Secondary | ICD-10-CM | POA: Diagnosis not present

## 2022-10-20 DIAGNOSIS — M25512 Pain in left shoulder: Secondary | ICD-10-CM | POA: Diagnosis not present

## 2022-10-20 DIAGNOSIS — R293 Abnormal posture: Secondary | ICD-10-CM | POA: Diagnosis not present

## 2022-10-20 DIAGNOSIS — G8929 Other chronic pain: Secondary | ICD-10-CM | POA: Insufficient documentation

## 2022-10-20 DIAGNOSIS — M542 Cervicalgia: Secondary | ICD-10-CM | POA: Insufficient documentation

## 2022-10-20 DIAGNOSIS — M6281 Muscle weakness (generalized): Secondary | ICD-10-CM | POA: Diagnosis not present

## 2022-10-20 NOTE — Therapy (Signed)
OUTPATIENT PHYSICAL THERAPY TREATMENT NOTE/Re-Cert   Patient Name: Jason Stokes/Er-ecrt MRN: [id number] DOB:11-25-1960, 62 y.o., male Today's Date: 10/20/2022  PCP: Charlott Rakes, MD   REFERRING PROVIDER: Ashok Pall, MD  END OF SESSION:   PT End of Session - 10/20/22 0808     Visit Number 12    Number of Visits 16    Date for PT Re-Evaluation 12/02/22    Authorization Type MEDICAID [place] ACCESS    Authorization Time Period Approved 12 PT visits from 09/20/22-10/31/22    Authorization - Visit Number 8    Authorization - Number of Visits 12    Progress Note Due on Visit 10    PT Start Time 0805    PT Stop Time 0848    PT Time Calculation (min) 43 min               Past Medical History:  Diagnosis Date   Anemia    Aneurysm of right internal iliac artery    a.) s/p embolization 08/19/2020: 29 mm RIGHT internal iliac artery aneurysm   Anxiety    Aortic atherosclerosis    Bilateral carpal tunnel syndrome 01/10/2018   Bipolar disorder    Chorioretinal inflammation of both eyes    a.) on azothioprine   Chronic lower back pain    Coronary artery calcification seen on CT scan    a.) cCTA 04/21/2022: Ca score 20.6 (61st percentile for age/sex match control)   DDD (degenerative disc disease), cervical    Depression    Diastolic dysfunction    a.) TTE 01/13/2021: EF 60-65%, mod LVH, triv MR, G1DD   GERD (gastroesophageal reflux disease)    Hepatic steatosis    History of alcohol abuse    History of nuclear stress test    Myoview 10/16: EF 50%, diaphragmatic attenuation, no ischemia, low risk   Hypertension    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   a.) CT head 12/25/2012 --> RIGHT basal ganglia hypoattenuation related to remote lacunar infarct   Lipoma    Long term (current) use of anticoagulants    a.) apixaban   Long-term current use of immunomodulator    a.) on azothioprine for peripheral focal chorioretinal inflammation (both eyes)   Marijuana use    Migraine     Moderate persistent asthma with acute exacerbation 05/02/2018   PAF (paroxysmal atrial fibrillation) 03/27/2015   a.) CHA2DS2VASc = 5 (HTN, CVA x2, vascular disease history, T2DM);  b.) s/p ablation 10/02/2015; c.) s/p ablation 12/10/2015; d.) s/p DCCV (200 J x 1) 12/11/2015; e.) s/p ablation 04/28/2022; f.) rate/rhythm maintained on oral diltiazem + carvedilol; chronically anticoagulated with apixaban   Pilonidal cyst    Schizophrenia    Sciatica neuralgia    T2DM (type 2 diabetes mellitus)    Thoracic aortic ectasia 01/13/2021   a.) TTE 01/13/2021: Ao root 38 mm, asc Ao 39 mm   Past Surgical History:  Procedure Laterality Date   ATRIAL FIBRILLATION ABLATION  09/22/2015   ATRIAL FIBRILLATION ABLATION N/A 04/28/2022   Procedure: ATRIAL FIBRILLATION ABLATION;  Surgeon: Constance Haw, MD;  Location: [place] [place] [facility];  Service: Cardiovascular;  Laterality: N/A;   CATARACT EXTRACTION Bilateral    CYST EXCISION  1996-97   surgery back of head    ELECTROPHYSIOLOGIC STUDY N/A 09/22/2015   Procedure: Atrial Fibrillation Ablation;  Surgeon: Will [name], MD;  Location: [place] [facility];  Service: Cardiovascular;  Laterality: N/A;   ELECTROPHYSIOLOGIC STUDY N/A 12/10/2015   Procedure: Atrial Fibrillation Ablation;  Surgeon: Will [name], MD;  Location: [place] [facility];  Service: Cardiovascular;  Laterality: N/A;   ELECTROPHYSIOLOGIC STUDY N/A 12/11/2015   Procedure: Cardioversion;  Surgeon: Will [name], MD;  Location: [place];  Service: Cardiovascular;  Laterality: N/A;   EMBOLIZATION Right 08/19/2020   Procedure: EMBOLIZATION;  Surgeon: Cherre Robins, MD;  Location: [place] [facility];  Service: Cardiovascular;  Laterality: Right;  hypogastric   EXCISION MASS HEAD N/A 01/06/2017   Procedure: EXCISION MASS FOREHEAD;  Surgeon: Irene Limbo, MD;  Location: [place];  Service: Plastics;  Laterality: N/A;   EXCISION MASS UPPER  EXTREMETIES Right 08/08/2022   Procedure: EXCISION MASS RIGHT FOREARM;  Surgeon: Leanora Cover, MD;  Location: [place];  Service: Orthopedics;  Laterality: Right;  45 MIN   GANGLION CYST EXCISION Left    INTERCOSTAL NERVE BLOCK  07/19/2003   KNEE ARTHROSCOPY Right 07/18/2014   MASS EXCISION N/A 01/25/2021   Procedure: EXCISION SUBCUTANEOUS VS SUBFASCIAL MASS TORSO 3CM;  Surgeon: Irene Limbo, MD;  Location: [place];  Service: Plastics;  Laterality: N/A;   PILONIDAL CYST EXCISION N/A 09/13/2022   Procedure: CYST EXCISION PILONIDAL EXTENSIVE;  Surgeon: Olean Ree, MD;  Location: [facility];  Service: General;  Laterality: N/A;   Patient Active Problem List   Diagnosis Date Noted   Pilonidal cyst 09/13/2022   Aortic atherosclerosis 06/07/2022   Hypercoagulable state due to paroxysmal atrial fibrillation 03/23/2021   Type 2 diabetes mellitus with hyperglycemia, without long-term current use of insulin 02/10/2021   Neck pain on left side 03/06/2019   Musculoskeletal chest pain 07/24/2018   Asthma, mild intermittent 05/02/2018   Allergic rhinitis caused by mold 05/02/2018   Tobacco use 05/02/2018   Bilateral carpal tunnel syndrome 01/10/2018   Stroke    Schizophrenia    Migraine    History of nuclear stress test    History of alcohol abuse    GERD (gastroesophageal reflux disease)    Dysrhythmia    Chronic lower back pain    Anxiety    Arthritis of knee, right 04/12/2017   Bipolar disorder 04/03/2017   Lipoma of forehead 09/22/2016   Trigger ring finger of right hand 06/22/2016   Paroxysmal atrial fibrillation    AF (atrial fibrillation) 12/10/2015   Eczema 07/10/2015   History of CVA (cerebrovascular accident) 05/04/2015   Tear of medial meniscus of right knee 03/18/2015   Chronic pain of right knee 03/12/2015   Other and unspecified hyperlipidemia 11/25/2013   Nasal congestion 11/25/2013   Sinusitis, chronic 05/09/2013   Chronic low back  pain 10/12/2012   Lumbar radiculopathy 10/12/2012   Hypertension 10/12/2012   Depression 10/12/2012   Insomnia 10/12/2012    REFERRING DIAG: M47.22 (ICD-10-CM) - Other spondylosis with radiculopathy, cervical region   THERAPY DIAG:  Cervicalgia  Abnormal posture  Muscle weakness (generalized)  Rationale for Evaluation and Treatment rehabilitation  PERTINENT HISTORY:  Migraines, bipolar disorder, hx of bilat carpal tunnel, PAF  PRECAUTIONS: None   WEIGHT BEARING RESTRICTIONS: No  SUBJECTIVE:  SUBJECTIVE STATEMENT: The needling helped and then the pain returns in a couple of days. I will get the left shoulder MRI on Saturday.   PAIN:  Are you having pain? Yes: NPRS scale: R 8/10, L 6/10  Pain location: neck pain R>L Pain description: Throb, ache Aggravating factors: Turning my head  Relieving factors: Not sure, heat and cold packs don't seem to help   OBJECTIVE: (objective measures completed at initial evaluation unless otherwise dated)   DIAGNOSTIC FINDINGS:                        CT scan 07/27/22 IMPRESSION: 1. No acute fracture or traumatic listhesis of the cervical spine. 2. 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   PATIENT SURVEYS:  NDI=41, 82% complete disability   COGNITION: Overall cognitive status: Within functional limits for tasks assessed   SENSATION: WFL   POSTURE: rounded shoulders and forward head   PALPATION: TTP of the cervical paraspinals and upper trap with increased muscle tension, L>R   CERVICAL ROM:    Active ROM A/PROM (deg) eval AROM 09/16/22 AROM 10/04/22 AROM 10/13/22 AROM 10/20/22  Flexion 35 pulling posterior bilat 32   40  Extension 40 pulling R lateral 40    40  Right lateral flexion 15 pulling bilat 15   18  Left lateral flexion 15 " needs to pop" 15   26  Right rotation 30 puling L lateral 30 30 40 30  Left rotation 40 pulling R lateral 38 42 50 40   (Blank rows = not tested)   UPPER EXTREMITY ROM: Passively able to lift arms to 110d bilat before report of pain as limitation Active ROM Right eval Left eval PROM LEFT 09/29/22 Right 10/11/22  Right 10/20/22  Shoulder flexion 90 90 145 140   Shoulder extension         Shoulder abduction     145 120 130  Shoulder adduction         Shoulder extension         Shoulder internal rotation     50    Shoulder external rotation     55    Elbow flexion         Elbow extension         Wrist flexion         Wrist extension         Wrist ulnar deviation         Wrist radial deviation         Wrist pronation         Wrist supination          (Blank rows = not tested)   UPPER EXTREMITY MMT: Grossly 4+ to 5/5 and equal bilat. Neck weakness noted per poor posture MMT Right eval Left eval  Shoulder flexion      Shoulder extension      Shoulder abduction      Shoulder adduction      Shoulder extension      Shoulder internal rotation      Shoulder external rotation      Middle trapezius      Lower trapezius      Elbow flexion      Elbow extension      Wrist flexion      Wrist extension      Wrist ulnar deviation  Wrist radial deviation      Wrist pronation      Wrist supination      Grip strength       (Blank rows = not tested)   CERVICAL SPECIAL TESTS:  Spurling's test positive; Distraction test positive   FUNCTIONAL TESTS:  NT   TODAY'S TREATMENT:  [facility] Adult PT Treatment:                                                DATE: 10/20/22 Therapeutic Exercise: Seated upper trap stretch Seated levator stretch Seated cervical retraction Supine DNF  Manual Therapy: Passive neck rotation, side bending, scalenes stretch, distraction  Modalities: IFC with HMP x 15  min    [facility] Adult PT Treatment:                                                DATE: 10/13/22 Therapeutic Exercise: Supine cervical retraction x10 3"  Supine cervical lift offs x10 5" Seated Cervical Rotation AROM 3 reps - 15 hold Gentle Levator Scapulae Stretch 3 reps - 15 hold Manual Therapy: STM and DTM to the cervical paraspinals and upper trap Cervical manual traction Skilled palapation to identify TrPs and taut muscle bands Trigger Point Dry Needling Treatment: Pre-treatment instruction: Patient instructed on dry needling rationale, procedures, and possible side effects including pain during treatment (achy,cramping feeling), bruising, drop of blood, lightheadedness, nausea, sweating. Patient Consent Given: Yes Education handout provided: Yes Muscles treated: Bilat upper trap and cervical paraspinals and multifidi Needle size and number: .30x28mm x 4 Electrical stimulation performed: Yes Parameters: mA 20 pps, intensity as tolerated Treatment response/outcome: Twitch response elicited Post-treatment instructions: Patient instructed to expect possible mild to moderate muscle soreness later today and/or tomorrow. Patient instructed in methods to reduce muscle soreness and to continue prescribed HEP. If patient was dry needled over the lung field, patient was instructed on signs and symptoms of pneumothorax and, however unlikely, to see immediate medical attention should they occur. Patient was also educated on signs and symptoms of infection and to seek medical attention should they occur. Patient verbalized understanding of these instructions and education.   [facility] Adult PT Treatment:                                                DATE: 10/11/22 Therapeutic Exercise: DNF on towel x 10 DNF with 10  arm circles each way Supine head on ball cervical rotations and head nods  Head lift 10 sec x 5  Supine head on ball with head press 5 sec x 10  Manual Therapy: Supine TPR to upper traps  bilat Supine passive cervical stretches and manual distraction. Modalities: Estim (IFC) with HMP x 15 minutes to tolerance  Self Care: Theracane for self TPR  [facility] Adult PT Treatment:                                                DATE: 10/06/22 Therapeutic  Exercise: DNF on towel x 10 DNF with 10  arm circles each way Supine head on ball cervical rotations and head nods  Head lift 10 sec x 5  Supine head on ball with head press 5 sec x 10  Manual Therapy: Supine TPR to upper traps bilat Supine passive cervical stretches and manual distraction.  Supine passive cervical rotation is >50 degrees each way   Modalities: Estim (IFC) with HMP x 15 minutes to tolerance  Self Care: Tennis ball vs theracane  . PATIENT EDUCATION:  Education details: Eval findings, POC, HEP, self care  Person educated: Patient Education method: Explanation, Demonstration, Tactile cues, Verbal cues, and Handouts Education comprehension: verbalized understanding, returned demonstration, verbal cues required, and tactile cues required   HOME EXERCISE PROGRAM: Access Code: [email or web address] URL: [email or web address] Date: 09/07/2022 Prepared by: Gar Ponto  Exercises - Seated Cervical Retraction  - 6 x daily - 7 x weekly - 1 sets - 3-5 reps - 3 hold - Supine Deep Neck Flexor Training - Hold  - 1 x daily - 7 x weekly - 1 sets - 10 reps - 5 hold - Seated Scapular Retraction  - 6 x daily - 7 x weekly - 1 sets - 3-5 reps - 3 hold - Seated Cervical Rotation AROM  - 6 x daily - 7 x weekly - 1 sets - 3 reps - 15 hold - Gentle Levator Scapulae Stretch  - 1 x daily - 7 x weekly - 1 sets - 3 reps - 15 hold - Standing Row with Anchored Resistance  - 1 x daily - 7 x weekly - 3 sets - 10 reps - Shoulder extension with resistance - Neutral  - 1 x daily - 7 x weekly - 3 sets - 10 reps   ASSESSMENT:   CLINICAL IMPRESSION: Pt reports short term improvement with TPDN last session. He received theracane for self  TPR over the weekend. He has a TENS unit at home. He reports right side of neck most bothersome and was instructed in scalenes stretch. He continues to require mod cues to sit with good posture without hiking shoulders.  Some motions of cervical AROM have improved while rotation remains the same as initial overall. He will have MRI Saturday to rule of RTC tear of left shoulder. Pt tolerated PT today without adverse effects. Pt will continue to benefit from skilled PT to address impairments for improved neck function less pain.  OBJECTIVE IMPAIRMENTS: decreased activity tolerance, decreased ROM, decreased strength, increased muscle spasms, impaired flexibility, impaired UE functional use, postural dysfunction, obesity, and pain.    ACTIVITY LIMITATIONS: carrying, lifting, bending, sitting, sleeping, dressing, reach over head, and caring for others   PARTICIPATION LIMITATIONS: meal prep, cleaning, laundry, and driving   PERSONAL FACTORS: Age, Fitness, Past/current experiences, Time since onset of injury/illness/exacerbation, and 1 comorbidity: high BMI  are also affecting patient's functional outcome.    REHAB POTENTIAL: Good   CLINICAL DECISION MAKING: Evolving/moderate complexity   EVALUATION COMPLEXITY: Moderate     GOALS:     SHORT TERM GOALS: Target date: 09/16/22   Pt will be Ind in an initial HEP Baseline: initial Goal status: MET 09/16/22   2.  Pt will voice understanding of measures to assist in pain reduction Baseline: Initial  Goal status: MET 09/16/22, use of heat and cold packs, TENs unit   LONG TERM GOALS: Target date: 12/02/22   Pt will be Ind in a final HEP to maintain achieved LOF  Baseline: initiated Goal  status: Ongoing   2.  Pt will be able to demonstrate proper sitting posture Baseline: Poor posture 10/04/22: poor posture 10/20/22: continues to require cues  Goal status: Ongoing   3.  Increase cervical side bending and and rotation for improved neck function,  especially with driving Baseline: see flow sheets 10/11/22: reports improvement in ability to turn head when driving  [id number]; see flow sheets Goal status: Improved   4.  Pt will demonstrate DNF endurance of 20" with a single attempt Baseline: NT 09/22/22: 25 sec Goal status: MET   5.  Pt will report neck pain of 4/10 or less for improved neck function and QOL Baseline: 8/10 Goal status: Improved     PLAN:   PT FREQUENCY: 2x/week   PT DURATION: 8 weeks   PLANNED INTERVENTIONS: Therapeutic exercises, Therapeutic activity, Patient/Family education, Self Care, Dry Needling, Electrical stimulation, Spinal manipulation, Spinal mobilization, Cryotherapy, Moist heat, Taping, Traction, Ultrasound, Ionotophoresis 4mg /ml Dexamethasone, Manual therapy, and Re-evaluation   PLAN FOR NEXT SESSION: MRI results?  Review NDI; assess response to HEP; progress therex as indicated; use of modalities, manual therapy; and TPDN as indicated    Hessie Diener, PTA 10/20/22 11:12 AM Phone: [phone] Fax: [phone]

## 2022-10-21 DIAGNOSIS — I480 Paroxysmal atrial fibrillation: Secondary | ICD-10-CM | POA: Diagnosis not present

## 2022-10-22 ENCOUNTER — Other Ambulatory Visit: Payer: Self-pay | Admitting: Neurosurgery

## 2022-10-22 ENCOUNTER — Ambulatory Visit
Admission: RE | Admit: 2022-10-22 | Discharge: 2022-10-22 | Disposition: A | Discharge status: Home / Self Care | Payer: Medicaid Other | Source: Ambulatory Visit | Attending: Neurosurgery | Admitting: Neurosurgery

## 2022-10-22 DIAGNOSIS — M25512 Pain in left shoulder: Secondary | ICD-10-CM

## 2022-10-22 DIAGNOSIS — I480 Paroxysmal atrial fibrillation: Secondary | ICD-10-CM | POA: Diagnosis not present

## 2022-10-22 DIAGNOSIS — M75122 Complete rotator cuff tear or rupture of left shoulder, not specified as traumatic: Secondary | ICD-10-CM | POA: Diagnosis not present

## 2022-10-22 DIAGNOSIS — M19012 Primary osteoarthritis, left shoulder: Secondary | ICD-10-CM | POA: Diagnosis not present

## 2022-10-22 DIAGNOSIS — M6258 Muscle wasting and atrophy, not elsewhere classified, other site: Secondary | ICD-10-CM | POA: Diagnosis not present

## 2022-10-23 DIAGNOSIS — I480 Paroxysmal atrial fibrillation: Secondary | ICD-10-CM | POA: Diagnosis not present

## 2022-10-24 DIAGNOSIS — I480 Paroxysmal atrial fibrillation: Secondary | ICD-10-CM | POA: Diagnosis not present

## 2022-10-25 ENCOUNTER — Encounter: Payer: Medicaid Other | Admitting: Physician Assistant

## 2022-10-25 ENCOUNTER — Ambulatory Visit (INDEPENDENT_AMBULATORY_CARE_PROVIDER_SITE_OTHER): Payer: Medicaid Other

## 2022-10-25 DIAGNOSIS — J309 Allergic rhinitis, unspecified: Secondary | ICD-10-CM | POA: Diagnosis not present

## 2022-10-25 DIAGNOSIS — Z6834 Body mass index (BMI) 34.0-34.9, adult: Secondary | ICD-10-CM | POA: Diagnosis not present

## 2022-10-25 DIAGNOSIS — M75102 Unspecified rotator cuff tear or rupture of left shoulder, not specified as traumatic: Secondary | ICD-10-CM | POA: Diagnosis not present

## 2022-10-25 DIAGNOSIS — I480 Paroxysmal atrial fibrillation: Secondary | ICD-10-CM | POA: Diagnosis not present

## 2022-10-26 DIAGNOSIS — I480 Paroxysmal atrial fibrillation: Secondary | ICD-10-CM | POA: Diagnosis not present

## 2022-10-27 ENCOUNTER — Ambulatory Visit (INDEPENDENT_AMBULATORY_CARE_PROVIDER_SITE_OTHER): Payer: Medicaid Other | Admitting: Physician Assistant

## 2022-10-27 ENCOUNTER — Encounter: Payer: Self-pay | Admitting: Physician Assistant

## 2022-10-27 VITALS — BP 155/93 | HR 86 | Temp 98.0°F | Ht 73.0 in | Wt 248.0 lb

## 2022-10-27 DIAGNOSIS — Z09 Encounter for follow-up examination after completed treatment for conditions other than malignant neoplasm: Secondary | ICD-10-CM

## 2022-10-27 DIAGNOSIS — L0591 Pilonidal cyst without abscess: Secondary | ICD-10-CM

## 2022-10-27 DIAGNOSIS — I480 Paroxysmal atrial fibrillation: Secondary | ICD-10-CM | POA: Diagnosis not present

## 2022-10-27 DIAGNOSIS — L7633 Postprocedural seroma of skin and subcutaneous tissue following a dermatologic procedure: Secondary | ICD-10-CM

## 2022-10-27 MED ORDER — OXYCODONE HCL 10 MG PO TABS: 10.0000 mg | ORAL_TABLET | ORAL | 0 refills | Status: DC | PRN

## 2022-10-27 NOTE — Patient Instructions (Signed)
The area may continue to drain for a while. If the drainage becomes thick and bad smelling or you develop any fevers or chills that is signs of infection.   Keep a dressing on the area until it stops draining fully. This may take a few more weeks.    Follow-up with our office as needed.  Please call and ask to speak with a nurse if you develop questions or concerns.

## 2022-10-27 NOTE — Progress Notes (Signed)
[facility] POST-OP OFFICE VISIT  10/27/2022  HPI: Jason Stokes is a 62 y.o. male s/p excision of pilonidal cyst on 09/13/2022 with [facility]  He reports that he continues to have intermittent episodes of drainage and pain from this area Described the drainage as "light pink" in color.  Pain seems to occur when laying flat and lasts for about 1 minute; this will precede the drainage No fever, chills Wants to ensure this is not infected  Vital signs: BP (!) 155/93   Pulse 86   Temp 98 F (36.7 C)   Ht 6\' 1"  (1.854 m)   Wt 248 lb (112.5 kg)   SpO2 98%   BMI 32.72 kg/m    Physical Exam: Constitutional: Well appearing male, NAD Skin: Chaperone present; incision to gluteal cleft is well healed. There is maybe a pinpoint (<37mm) whole to the inferior aspect of this. There is no expressible drainage. There is no way to probe this do to size. The surrounding tissue is indurated expectedly but no erythema, no evidence of abscess nor necrosis.   Assessment/Plan: This is a 62 y.o. male s/p excision of pilonidal cyst on 09/13/2022 with [facility]   - Will give a final refill for pain medications; will not get any narcotics from [place] for this issue  - No role of Abx nor I&D; likely draining seroma intermittently   - Reviewed wound care recommendation  - He can follow up on as needed basis; He understands to call with questions/concerns  -- Lynden Oxford, PA-C [facility] 10/27/2022, 2:54 PM M-F: 7am - 4pm

## 2022-10-28 ENCOUNTER — Ambulatory Visit (INDEPENDENT_AMBULATORY_CARE_PROVIDER_SITE_OTHER): Payer: Medicaid Other | Admitting: Orthopaedic Surgery

## 2022-10-28 ENCOUNTER — Encounter: Payer: Self-pay | Admitting: Orthopaedic Surgery

## 2022-10-28 VITALS — BP 156/87 | HR 78 | Ht 73.0 in | Wt 248.0 lb

## 2022-10-28 DIAGNOSIS — M7502 Adhesive capsulitis of left shoulder: Secondary | ICD-10-CM | POA: Diagnosis not present

## 2022-10-28 DIAGNOSIS — M75122 Complete rotator cuff tear or rupture of left shoulder, not specified as traumatic: Secondary | ICD-10-CM

## 2022-10-28 DIAGNOSIS — M75121 Complete rotator cuff tear or rupture of right shoulder, not specified as traumatic: Secondary | ICD-10-CM | POA: Insufficient documentation

## 2022-10-28 DIAGNOSIS — I480 Paroxysmal atrial fibrillation: Secondary | ICD-10-CM | POA: Diagnosis not present

## 2022-10-28 NOTE — Progress Notes (Signed)
Office Visit Note   [name]           Date of Birth: 06-07-1961           MRN: [id number] Visit Date: 10/28/2022              Requested by: Hoy Register, MD [address] [place] [address] [place],  [place] [address] PCP: Hoy Register, MD   Assessment & Plan: Visit Diagnoses:  1. Complete tear of left rotator cuff, unspecified whether traumatic   2. Adhesive capsulitis of left shoulder     Plan: Patient does have some foraminal stenosis in his neck but principal problem is a tear in his  left rotator cuff that likely happened more than 6 months ago and then he is gotten secondary adhesive capsulitis of his shoulder which is painful.  Will send note to therapy which she is already attending his next visit is the 17th of this month he can work on passive range of motion of his left shoulder with the supraspinatus tear.  Once his shoulder loosens up some then we can proceed with surgery.  Patient has a drill at home he will get a pulley from either [employer] or [employer], place it in a closet door and get a pulley going so he can sit at home and help with abduction of his shoulder passively.  I will check him in 4 weeks and if he has picked up some motion doing better then we can set him up for outpatient rotator cuff repair and biceps tenodesis.  Follow-Up Instructions: Return in about 4 weeks (around 11/25/2022).   Orders:  No orders of the defined types were placed in this encounter.  No orders of the defined types were placed in this encounter.     Procedures: No procedures performed   Clinical Data: No additional findings.   Subjective: Chief Complaint  Patient presents with   Left Shoulder - Pain    HPI 62 year old male with some cervical foraminal stenosis followed by Dr. Franky Macho has had problems with his left shoulder for at least 5 months with decreased range of motion and pain.  He has noticed inability to get his left arm up over his head does not  recall any specific injury.  He has tried some oxycodone which does not seem to help.  He has trouble reaching the top of his head.  Problems reaching behind him he can get his hand to his posterior axillary line.  He states he has been using his right arm and hand more due to left shoulder pain and now is starting to have some discomfort in the opposite right shoulder.  Patient with history of chronic back pain, depression, history of alcohol abuse, history of lacunar infarct, right internal iliac artery aneurysm, bilateral carpal tunnel releases.,  Type 2 diabetes.  Atrial fibs ablation in the past.  Review of Systems all systems noncontributory to HPI.   Objective: Vital Signs: BP (!) 156/87   Pulse 78   Ht  (1.854 m)   Wt 248 lb (112.5 kg)   BMI 32.72 kg/m   Physical Exam Constitutional:      Appearance: He is well-developed.  HENT:     Head: Normocephalic and atraumatic.     Right Ear: External ear normal.     Left Ear: External ear normal.  Eyes:     Pupils: Pupils are equal, round, and reactive to light.  Neck:     Thyroid:  No thyromegaly.     Trachea: No tracheal deviation.  Cardiovascular:     Rate and Rhythm: Normal rate.  Pulmonary:     Effort: Pulmonary effort is normal.     Breath sounds: No wheezing.  Abdominal:     General: Bowel sounds are normal.     Palpations: Abdomen is soft.  Musculoskeletal:     Cervical back: Neck supple.  Skin:    General: Skin is warm and dry.     Capillary Refill: Capillary refill takes less than 2 seconds.  Neurological:     Mental Status: He is alert and oriented to person, place, and time.  Psychiatric:        Behavior: Behavior normal.        Thought Content: Thought content normal.        Judgment: Judgment normal.     Ortho Exam patient has 80 degrees abduction left shoulder positive impingement test positive drop arm test Long head of the biceps is moderately tender anteriorly without subluxation negative  Yergason.  No subluxation of the shoulder.  Opposite right shoulder and get his arm up over his head easily.  Upper extremity reflexes are 2+ and symmetrical.  Mild discomfort with cervical rotation.  Some brachial plexus tenderness left side more than right side.  Specialty Comments:  MRI CERVICAL SPINE WITHOUT CONTRAST   TECHNIQUE: Multiplanar, multisequence MR imaging of the cervical spine was performed. No intravenous contrast was administered.   COMPARISON:  Cervical spine CT 12/06/2020   FINDINGS: Alignment: Unremarkable   Vertebrae: No fracture, evidence of discitis, or bone lesion.   Cord: Normal signal and morphology.   Posterior Fossa, vertebral arteries, paraspinal tissues: Suboccipital scarring. No perispinal mass or inflammation.   Disc levels:   C2-3: Asymmetric left facet spurring.  No neural impingement   C3-4: Disc space narrowing and bulging with endplate and uncovertebral ridging. Left foraminal impingement. A disc osteophyte complex mildly indents the ventral cord   C4-5: Disc narrowing and bulging. Asymmetric left facet spurring. Moderate left and mild right foraminal narrowing   C5-6: Disc space narrowing and bulging with uncovertebral ridging. Mild facet spurring. Moderate bilateral foraminal narrowing   IMPRESSION: 1. 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 Moderate right foraminal narrowing at C5-6. 2. Diffusely patent spinal canal.     Electronically Signed   By: Tiburcio Pea M.D.   On: 05/18/2022 10:22  Imaging: Narrative & Impression  CLINICAL DATA:  Left shoulder pain radiating down the arm.   EXAM: MRI OF THE LEFT SHOULDER WITHOUT CONTRAST   TECHNIQUE: Multiplanar, multisequence MR imaging of the shoulder was performed. No intravenous contrast was administered.   COMPARISON:  Left arm radiographs 04/27/2022   FINDINGS: Rotator cuff: There is a full-thickness tear of the mid to  anterior supraspinatus tendon footprint measuring up to 14 mm in AP dimension (sagittal image 24) and with up to 10 mm tendon retraction (coronal image 13). There is trace fluid at the deep aspect of the proximal infraspinatus musculotendinous junction (sagittal series 6, image 13), a tiny interstitial tear. There is also a tiny articular sided tear of the distal critical zone of the mid to anterior infraspinatus, measuring only 3 mm in AP dimension, 2 mm along the longitudinal dimension of the tendon, and 3 mm in craniocaudal thickness (sagittal series 6, image 20 and coronal series 4, image 6). There is a partial-thickness tear of the midsubstance fibers of the superior subscapularis tendon in  a region measuring up to 9 mm in craniocaudal dimension. The teres minor is intact.   Muscles:  Mild to moderate anterior supraspinatus muscle atrophy.   Biceps long head: Mild to moderate intermediate T2 signal and thickening tendinosis of the long head of the biceps tendon proximal to the bicipital groove. The superior subscapularis tendon tear allows the long head of the biceps tendon to be partially perched on the anterior superior aspect of the lesser tuberosity as the biceps tendon enters the bicipital groove (axial series 8, image 12).   Acromioclavicular Joint: There are moderate to severe degenerative changes of the acromioclavicular joint including joint space narrowing, subchondral marrow edema, and peripheral osteophytosis. Type III acromion, with mild downsloping of the anterolateral acromion. Mild-to-moderate fluid within the subacromial/subdeltoid bursa.   Glenohumeral Joint: Moderate glenohumeral cartilage thinning. Mild inferior glenoid and humeral head-neck junction degenerative osteophytes.   Labrum: Mild degenerative irregularity of the posteroinferior glenoid labrum.   Bones:  No acute fracture.   Other: None.   IMPRESSION: 1. Full-thickness tear of the mid to  anterior supraspinatus tendon footprint measuring up to 14 mm in AP dimension. 2. Tiny, shallow articular sided tear of the distal critical zone of the mid to anterior infraspinatus tendon measuring only 3 mm in AP dimension. 3. Partial-thickness tear of the midsubstance fibers of the superior subscapularis tendon in a region measuring up to 9 mm in craniocaudal dimension. 4. Mild-to-moderate long head of the biceps tendinosis proximal to the bicipital groove. The superior subscapularis tendon tear allows the long head of the biceps tendon to be partially perched on the anterior superior aspect of the lesser tuberosity as the biceps tendon enters the bicipital groove. 5. Moderate to severe degenerative changes of the acromioclavicular joint. Type III acromion.     Electronically Signed   By: Neita Garnet M.D.   On: 10/25/2022 13:26     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   Pilonidal cyst 09/13/2022   Aortic atherosclerosis 06/07/2022   Hypercoagulable state due to paroxysmal atrial fibrillation 03/23/2021   Type 2 diabetes mellitus with hyperglycemia, without long-term current use of insulin 02/10/2021   Neck pain on left side 03/06/2019   Musculoskeletal chest pain 07/24/2018   Asthma, mild intermittent 05/02/2018   Allergic rhinitis caused by mold 05/02/2018   Tobacco use 05/02/2018   Bilateral carpal tunnel syndrome 01/10/2018   Stroke    Schizophrenia    Migraine    History of nuclear stress test    History of alcohol abuse    GERD (gastroesophageal reflux disease)    Dysrhythmia    Chronic lower back pain    Anxiety    Arthritis of knee, right 04/12/2017   Bipolar disorder 04/03/2017   Lipoma of forehead 09/22/2016   Trigger ring finger of right hand 06/22/2016   Paroxysmal atrial fibrillation    AF (atrial fibrillation) 12/10/2015    Eczema 07/10/2015   History of CVA (cerebrovascular accident) 05/04/2015   Tear of medial meniscus of right knee 03/18/2015   Chronic pain of right knee 03/12/2015   Other and unspecified hyperlipidemia 11/25/2013   Nasal congestion 11/25/2013   Sinusitis, chronic 05/09/2013   Chronic low back pain 10/12/2012   Lumbar radiculopathy 10/12/2012   Hypertension 10/12/2012   Depression 10/12/2012   Insomnia 10/12/2012   Past Medical History:  Diagnosis Date   Anemia  Aneurysm of right internal iliac artery    a.) s/p embolization 08/19/2020: 29 mm RIGHT internal iliac artery aneurysm   Anxiety    Aortic atherosclerosis    Bilateral carpal tunnel syndrome 01/10/2018   Bipolar disorder    Chorioretinal inflammation of both eyes    a.) on azothioprine   Chronic lower back pain    Coronary artery calcification seen on CT scan    a.) cCTA 04/21/2022: Ca score 20.6 (61st percentile for age/sex match control)   DDD (degenerative disc disease), cervical    Depression    Diastolic dysfunction    a.) TTE 01/13/2021: EF 60-65%, mod LVH, triv MR, G1DD   GERD (gastroesophageal reflux disease)    Hepatic steatosis    History of alcohol abuse    History of nuclear stress test    Myoview 10/16: EF 50%, diaphragmatic attenuation, no ischemia, low risk   Hypertension    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   a.) CT head 12/25/2012 --> RIGHT basal ganglia hypoattenuation related to remote lacunar infarct   Lipoma    Long term (current) use of anticoagulants    a.) apixaban   Long-term current use of immunomodulator    a.) on azothioprine for peripheral focal chorioretinal inflammation (both eyes)   Marijuana use    Migraine    Moderate persistent asthma with acute exacerbation 05/02/2018   PAF (paroxysmal atrial fibrillation) 03/27/2015   a.) CHA2DS2VASc = 5 (HTN, CVA x2, vascular disease history, T2DM);  b.) s/p ablation 10/02/2015; c.) s/p ablation 12/10/2015; d.) s/p DCCV (200 J x 1)  12/11/2015; e.) s/p ablation 04/28/2022; f.) rate/rhythm maintained on oral diltiazem + carvedilol; chronically anticoagulated with apixaban   Pilonidal cyst    Schizophrenia    Sciatica neuralgia    T2DM (type 2 diabetes mellitus)    Thoracic aortic ectasia 01/13/2021   a.) TTE 01/13/2021: Ao root 38 mm, asc Ao 39 mm    Family History  Problem Relation Age of Onset   Heart disease Father    Schizophrenia Sister     Past Surgical History:  Procedure Laterality Date   ATRIAL FIBRILLATION ABLATION  09/22/2015   ATRIAL FIBRILLATION ABLATION N/A 04/28/2022   Procedure: ATRIAL FIBRILLATION ABLATION;  Surgeon: Regan Lemming, MD;  Location: [facility];  Service: Cardiovascular;  Laterality: N/A;   CATARACT EXTRACTION Bilateral    CYST EXCISION  1996-97   surgery back of head    ELECTROPHYSIOLOGIC STUDY N/A 09/22/2015   Procedure: Atrial Fibrillation Ablation;  Surgeon: Will Jorja Loa, MD;  Location: [facility];  Service: Cardiovascular;  Laterality: N/A;   ELECTROPHYSIOLOGIC STUDY N/A 12/10/2015   Procedure: Atrial Fibrillation Ablation;  Surgeon: Will Jorja Loa, MD;  Location: [facility];  Service: Cardiovascular;  Laterality: N/A;   ELECTROPHYSIOLOGIC STUDY N/A 12/11/2015   Procedure: Cardioversion;  Surgeon: Will Jorja Loa, MD;  Location: [facility];  Service: Cardiovascular;  Laterality: N/A;   EMBOLIZATION Right 08/19/2020   Procedure: EMBOLIZATION;  Surgeon: Leonie Douglas, MD;  Location: [facility];  Service: Cardiovascular;  Laterality: Right;  hypogastric   EXCISION MASS HEAD N/A 01/06/2017   Procedure: EXCISION MASS FOREHEAD;  Surgeon: Glenna Fellows, MD;  Location: [facility];  Service: Plastics;  Laterality: N/A;   EXCISION MASS UPPER EXTREMETIES Right 08/08/2022   Procedure: EXCISION MASS RIGHT FOREARM;  Surgeon: Betha Loa, MD;  Location: [facility];  Service: Orthopedics;   Laterality: Right;  45 MIN  GANGLION CYST EXCISION Left    INTERCOSTAL NERVE BLOCK  07/19/2003   KNEE ARTHROSCOPY Right 07/18/2014   MASS EXCISION N/A 01/25/2021   Procedure: EXCISION SUBCUTANEOUS VS SUBFASCIAL MASS TORSO 3CM;  Surgeon: Glenna Fellows, MD;  Location: [facility];  Service: Plastics;  Laterality: N/A;   PILONIDAL CYST EXCISION N/A 09/13/2022   Procedure: CYST EXCISION PILONIDAL EXTENSIVE;  Surgeon: Henrene Dodge, MD;  Location: [facility];  Service: General;  Laterality: N/A;   Social History   Occupational History    Comment: disabled  Tobacco Use   Smoking status: Former    Years: 29    Types: Cigarettes    Quit date: 09/10/2022    Years since quitting: 0.1    Passive exposure: Never   Smokeless tobacco: Never   Tobacco comments:    1 cigarettes every day  06-02-2021  Vaping Use   Vaping Use: Never used  Substance and Sexual Activity   Alcohol use: Yes    Alcohol/week: 6.0 standard drinks of alcohol    Types: 6 Cans of beer per week    Comment: 6 pack weekly 05/26/22   Drug use: Yes    Types: Marijuana    Comment: sunday was last time   Sexual activity: Not Currently

## 2022-10-29 DIAGNOSIS — I480 Paroxysmal atrial fibrillation: Secondary | ICD-10-CM | POA: Diagnosis not present

## 2022-10-30 DIAGNOSIS — I480 Paroxysmal atrial fibrillation: Secondary | ICD-10-CM | POA: Diagnosis not present

## 2022-10-31 DIAGNOSIS — I480 Paroxysmal atrial fibrillation: Secondary | ICD-10-CM | POA: Diagnosis not present

## 2022-11-01 DIAGNOSIS — I480 Paroxysmal atrial fibrillation: Secondary | ICD-10-CM | POA: Diagnosis not present

## 2022-11-01 NOTE — Therapy (Signed)
OUTPATIENT PHYSICAL THERAPY TREATMENT NOTE/Re-Cert   Patient Name: Jason Stokes/Er-ecrt MRN: [id number] DOB:11-25-1960, 62 y.o., male Today's Date: 10/20/2022  PCP: Charlott Rakes, MD   REFERRING PROVIDER: Ashok Pall, MD  END OF SESSION:   PT End of Session - 10/20/22 0808     Visit Number 12    Number of Visits 16    Date for PT Re-Evaluation 12/02/22    Authorization Type MEDICAID [place] ACCESS    Authorization Time Period Approved 12 PT visits from 09/20/22-10/31/22    Authorization - Visit Number 8    Authorization - Number of Visits 12    Progress Note Due on Visit 10    PT Start Time 0805    PT Stop Time 0848    PT Time Calculation (min) 43 min               Past Medical History:  Diagnosis Date   Anemia    Aneurysm of right internal iliac artery    a.) s/p embolization 08/19/2020: 29 mm RIGHT internal iliac artery aneurysm   Anxiety    Aortic atherosclerosis    Bilateral carpal tunnel syndrome 01/10/2018   Bipolar disorder    Chorioretinal inflammation of both eyes    a.) on azothioprine   Chronic lower back pain    Coronary artery calcification seen on CT scan    a.) cCTA 04/21/2022: Ca score 20.6 (61st percentile for age/sex match control)   DDD (degenerative disc disease), cervical    Depression    Diastolic dysfunction    a.) TTE 01/13/2021: EF 60-65%, mod LVH, triv MR, G1DD   GERD (gastroesophageal reflux disease)    Hepatic steatosis    History of alcohol abuse    History of nuclear stress test    Myoview 10/16: EF 50%, diaphragmatic attenuation, no ischemia, low risk   Hypertension    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   a.) CT head 12/25/2012 --> RIGHT basal ganglia hypoattenuation related to remote lacunar infarct   Lipoma    Long term (current) use of anticoagulants    a.) apixaban   Long-term current use of immunomodulator    a.) on azothioprine for peripheral focal chorioretinal inflammation (both eyes)   Marijuana use    Migraine     Moderate persistent asthma with acute exacerbation 05/02/2018   PAF (paroxysmal atrial fibrillation) 03/27/2015   a.) CHA2DS2VASc = 5 (HTN, CVA x2, vascular disease history, T2DM);  b.) s/p ablation 10/02/2015; c.) s/p ablation 12/10/2015; d.) s/p DCCV (200 J x 1) 12/11/2015; e.) s/p ablation 04/28/2022; f.) rate/rhythm maintained on oral diltiazem + carvedilol; chronically anticoagulated with apixaban   Pilonidal cyst    Schizophrenia    Sciatica neuralgia    T2DM (type 2 diabetes mellitus)    Thoracic aortic ectasia 01/13/2021   a.) TTE 01/13/2021: Ao root 38 mm, asc Ao 39 mm   Past Surgical History:  Procedure Laterality Date   ATRIAL FIBRILLATION ABLATION  09/22/2015   ATRIAL FIBRILLATION ABLATION N/A 04/28/2022   Procedure: ATRIAL FIBRILLATION ABLATION;  Surgeon: Constance Haw, MD;  Location: [place] [place] [facility];  Service: Cardiovascular;  Laterality: N/A;   CATARACT EXTRACTION Bilateral    CYST EXCISION  1996-97   surgery back of head    ELECTROPHYSIOLOGIC STUDY N/A 09/22/2015   Procedure: Atrial Fibrillation Ablation;  Surgeon: Will [name], MD;  Location: [place] [facility];  Service: Cardiovascular;  Laterality: N/A;   ELECTROPHYSIOLOGIC STUDY N/A 12/10/2015   Procedure: Atrial Fibrillation Ablation;  Surgeon: Will [name], MD;  Location: [place] [facility];  Service: Cardiovascular;  Laterality: N/A;   ELECTROPHYSIOLOGIC STUDY N/A 12/11/2015   Procedure: Cardioversion;  Surgeon: Will [name], MD;  Location: [place];  Service: Cardiovascular;  Laterality: N/A;   EMBOLIZATION Right 08/19/2020   Procedure: EMBOLIZATION;  Surgeon: Cherre Robins, MD;  Location: [place] [facility];  Service: Cardiovascular;  Laterality: Right;  hypogastric   EXCISION MASS HEAD N/A 01/06/2017   Procedure: EXCISION MASS FOREHEAD;  Surgeon: Irene Limbo, MD;  Location: [place];  Service: Plastics;  Laterality: N/A;   EXCISION MASS UPPER  EXTREMETIES Right 08/08/2022   Procedure: EXCISION MASS RIGHT FOREARM;  Surgeon: Leanora Cover, MD;  Location: [place];  Service: Orthopedics;  Laterality: Right;  45 MIN   GANGLION CYST EXCISION Left    INTERCOSTAL NERVE BLOCK  07/19/2003   KNEE ARTHROSCOPY Right 07/18/2014   MASS EXCISION N/A 01/25/2021   Procedure: EXCISION SUBCUTANEOUS VS SUBFASCIAL MASS TORSO 3CM;  Surgeon: Irene Limbo, MD;  Location: [place];  Service: Plastics;  Laterality: N/A;   PILONIDAL CYST EXCISION N/A 09/13/2022   Procedure: CYST EXCISION PILONIDAL EXTENSIVE;  Surgeon: Olean Ree, MD;  Location: [facility];  Service: General;  Laterality: N/A;   Patient Active Problem List   Diagnosis Date Noted   Pilonidal cyst 09/13/2022   Aortic atherosclerosis 06/07/2022   Hypercoagulable state due to paroxysmal atrial fibrillation 03/23/2021   Type 2 diabetes mellitus with hyperglycemia, without long-term current use of insulin 02/10/2021   Neck pain on left side 03/06/2019   Musculoskeletal chest pain 07/24/2018   Asthma, mild intermittent 05/02/2018   Allergic rhinitis caused by mold 05/02/2018   Tobacco use 05/02/2018   Bilateral carpal tunnel syndrome 01/10/2018   Stroke    Schizophrenia    Migraine    History of nuclear stress test    History of alcohol abuse    GERD (gastroesophageal reflux disease)    Dysrhythmia    Chronic lower back pain    Anxiety    Arthritis of knee, right 04/12/2017   Bipolar disorder 04/03/2017   Lipoma of forehead 09/22/2016   Trigger ring finger of right hand 06/22/2016   Paroxysmal atrial fibrillation    AF (atrial fibrillation) 12/10/2015   Eczema 07/10/2015   History of CVA (cerebrovascular accident) 05/04/2015   Tear of medial meniscus of right knee 03/18/2015   Chronic pain of right knee 03/12/2015   Other and unspecified hyperlipidemia 11/25/2013   Nasal congestion 11/25/2013   Sinusitis, chronic 05/09/2013   Chronic low back  pain 10/12/2012   Lumbar radiculopathy 10/12/2012   Hypertension 10/12/2012   Depression 10/12/2012   Insomnia 10/12/2012    REFERRING DIAG: M47.22 (ICD-10-CM) - Other spondylosis with radiculopathy, cervical region   THERAPY DIAG:  Cervicalgia  Abnormal posture  Muscle weakness (generalized)  Rationale for Evaluation and Treatment rehabilitation  PERTINENT HISTORY:  Migraines, bipolar disorder, hx of bilat carpal tunnel, PAF  PRECAUTIONS: None   WEIGHT BEARING RESTRICTIONS: No  SUBJECTIVE:  SUBJECTIVE STATEMENT: The needling helped and then the pain returns in a couple of days. I will get the left shoulder MRI on Saturday.   PAIN:  Are you having pain? Yes: NPRS scale: R 8/10, L 6/10  Pain location: neck pain R>L Pain description: Throb, ache Aggravating factors: Turning my head  Relieving factors: Not sure, heat and cold packs don't seem to help   OBJECTIVE: (objective measures completed at initial evaluation unless otherwise dated)   DIAGNOSTIC FINDINGS:                        CT scan 07/27/22 IMPRESSION: 1. No acute fracture or traumatic listhesis of the cervical spine. 2. 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   PATIENT SURVEYS:  NDI=41, 82% complete disability   COGNITION: Overall cognitive status: Within functional limits for tasks assessed   SENSATION: WFL   POSTURE: rounded shoulders and forward head   PALPATION: TTP of the cervical paraspinals and upper trap with increased muscle tension, L>R   CERVICAL ROM:    Active ROM A/PROM (deg) eval AROM 09/16/22 AROM 10/04/22 AROM 10/13/22 AROM 10/20/22  Flexion 35 pulling posterior bilat 32   40  Extension 40 pulling R lateral 40    40  Right lateral flexion 15 pulling bilat 15   18  Left lateral flexion 15 " needs to pop" 15   26  Right rotation 30 puling L lateral 30 30 40 30  Left rotation 40 pulling R lateral 38 42 50 40   (Blank rows = not tested)   UPPER EXTREMITY ROM: Passively able to lift arms to 110d bilat before report of pain as limitation Active ROM Right eval Left eval PROM LEFT 09/29/22 Right 10/11/22  Right 10/20/22  Shoulder flexion 90 90 145 140   Shoulder extension         Shoulder abduction     145 120 130  Shoulder adduction         Shoulder extension         Shoulder internal rotation     50    Shoulder external rotation     55    Elbow flexion         Elbow extension         Wrist flexion         Wrist extension         Wrist ulnar deviation         Wrist radial deviation         Wrist pronation         Wrist supination          (Blank rows = not tested)   UPPER EXTREMITY MMT: Grossly 4+ to 5/5 and equal bilat. Neck weakness noted per poor posture MMT Right eval Left eval  Shoulder flexion      Shoulder extension      Shoulder abduction      Shoulder adduction      Shoulder extension      Shoulder internal rotation      Shoulder external rotation      Middle trapezius      Lower trapezius      Elbow flexion      Elbow extension      Wrist flexion      Wrist extension      Wrist ulnar deviation  Wrist radial deviation      Wrist pronation      Wrist supination      Grip strength       (Blank rows = not tested)   CERVICAL SPECIAL TESTS:  Spurling's test positive; Distraction test positive   FUNCTIONAL TESTS:  NT   TODAY'S TREATMENT:  [facility] Adult PT Treatment:                                                DATE: 11/02/22 Therapeutic Exercise: *** Manual Therapy: *** Neuromuscular re-ed: *** Therapeutic Activity: *** Modalities: *** Self Care: ***  [facility] PT Treatment:                                                DATE: 10/20/22 Therapeutic  Exercise: Seated upper trap stretch Seated levator stretch Seated cervical retraction Supine DNF  Manual Therapy: Passive neck rotation, side bending, scalenes stretch, distraction  Modalities: IFC with HMP x 15 min    [facility] Adult PT Treatment:                                                DATE: 10/13/22 Therapeutic Exercise: Supine cervical retraction x10 3"  Supine cervical lift offs x10 5" Seated Cervical Rotation AROM 3 reps - 15 hold Gentle Levator Scapulae Stretch 3 reps - 15 hold Manual Therapy: STM and DTM to the cervical paraspinals and upper trap Cervical manual traction Skilled palapation to identify TrPs and taut muscle bands Trigger Point Dry Needling Treatment: Pre-treatment instruction: Patient instructed on dry needling rationale, procedures, and possible side effects including pain during treatment (achy,cramping feeling), bruising, drop of blood, lightheadedness, nausea, sweating. Patient Consent Given: Yes Education handout provided: Yes Muscles treated: Bilat upper trap and cervical paraspinals and multifidi Needle size and number: .30x67mm x 4 Electrical stimulation performed: Yes Parameters: mA 20 pps, intensity as tolerated Treatment response/outcome: Twitch response elicited Post-treatment instructions: Patient instructed to expect possible mild to moderate muscle soreness later today and/or tomorrow. Patient instructed in methods to reduce muscle soreness and to continue prescribed HEP. If patient was dry needled over the lung field, patient was instructed on signs and symptoms of pneumothorax and, however unlikely, to see immediate medical attention should they occur. Patient was also educated on signs and symptoms of infection and to seek medical attention should they occur. Patient verbalized understanding of these instructions and education.   [facility] Adult PT Treatment:                                                DATE: 10/11/22 Therapeutic  Exercise: DNF on towel x 10 DNF with 10  arm circles each way Supine head on ball cervical rotations and head nods  Head lift 10 sec x 5  Supine head on ball with head press 5 sec x 10  Manual Therapy: Supine TPR to upper traps bilat Supine passive cervical stretches and manual distraction. Modalities:  Estim (IFC) with HMP x 15 minutes to tolerance  Self Care: Theracane for self TPR  [facility] Adult PT Treatment:                                                DATE: 10/06/22 Therapeutic Exercise: DNF on towel x 10 DNF with 10  arm circles each way Supine head on ball cervical rotations and head nods  Head lift 10 sec x 5  Supine head on ball with head press 5 sec x 10  Manual Therapy: Supine TPR to upper traps bilat Supine passive cervical stretches and manual distraction.  Supine passive cervical rotation is >50 degrees each way   Modalities: Estim (IFC) with HMP x 15 minutes to tolerance  Self Care: Tennis ball vs theracane  . PATIENT EDUCATION:  Education details: Eval findings, POC, HEP, self care  Person educated: Patient Education method: Explanation, Demonstration, Tactile cues, Verbal cues, and Handouts Education comprehension: verbalized understanding, returned demonstration, verbal cues required, and tactile cues required   HOME EXERCISE PROGRAM: Access Code: [email or web address] URL: [email or web address] Date: 09/07/2022 Prepared by: Joellyn Rued  Exercises - Seated Cervical Retraction  - 6 x daily - 7 x weekly - 1 sets - 3-5 reps - 3 hold - Supine Deep Neck Flexor Training - Hold  - 1 x daily - 7 x weekly - 1 sets - 10 reps - 5 hold - Seated Scapular Retraction  - 6 x daily - 7 x weekly - 1 sets - 3-5 reps - 3 hold - Seated Cervical Rotation AROM  - 6 x daily - 7 x weekly - 1 sets - 3 reps - 15 hold - Gentle Levator Scapulae Stretch  - 1 x daily - 7 x weekly - 1 sets - 3 reps - 15 hold - Standing Row with Anchored Resistance  - 1 x daily - 7 x weekly - 3 sets  - 10 reps - Shoulder extension with resistance - Neutral  - 1 x daily - 7 x weekly - 3 sets - 10 reps   ASSESSMENT:   CLINICAL IMPRESSION: Pt reports short term improvement with TPDN last session. He received theracane for self TPR over the weekend. He has a TENS unit at home. He reports right side of neck most bothersome and was instructed in scalenes stretch. He continues to require mod cues to sit with good posture without hiking shoulders.  Some motions of cervical AROM have improved while rotation remains the same as initial overall. He will have MRI Saturday to rule of RTC tear of left shoulder. Pt tolerated PT today without adverse effects. Pt will continue to benefit from skilled PT to address impairments for improved neck function less pain.  OBJECTIVE IMPAIRMENTS: decreased activity tolerance, decreased ROM, decreased strength, increased muscle spasms, impaired flexibility, impaired UE functional use, postural dysfunction, obesity, and pain.    ACTIVITY LIMITATIONS: carrying, lifting, bending, sitting, sleeping, dressing, reach over head, and caring for others   PARTICIPATION LIMITATIONS: meal prep, cleaning, laundry, and driving   PERSONAL FACTORS: Age, Fitness, Past/current experiences, Time since onset of injury/illness/exacerbation, and 1 comorbidity: high BMI  are also affecting patient's functional outcome.    REHAB POTENTIAL: Good   CLINICAL DECISION MAKING: Evolving/moderate complexity   EVALUATION COMPLEXITY: Moderate     GOALS:     SHORT TERM GOALS: Target date: 09/16/22  Pt will be Ind in an initial HEP Baseline: initial Goal status: MET 09/16/22   2.  Pt will voice understanding of measures to assist in pain reduction Baseline: Initial  Goal status: MET 09/16/22, use of heat and cold packs, TENs unit   LONG TERM GOALS: Target date: 12/02/22   Pt will be Ind in a final HEP to maintain achieved LOF  Baseline: initiated Goal status: Ongoing   2.  Pt will be able  to demonstrate proper sitting posture Baseline: Poor posture 10/04/22: poor posture 10/20/22: continues to require cues  Goal status: Ongoing   3.  Increase cervical side bending and and rotation for improved neck function, especially with driving Baseline: see flow sheets 10/11/22: reports improvement in ability to turn head when driving  08/16/84; see flow sheets Goal status: Improved   4.  Pt will demonstrate DNF endurance of 20" with a single attempt Baseline: NT 09/22/22: 25 sec Goal status: MET   5.  Pt will report neck pain of 4/10 or less for improved neck function and QOL Baseline: 8/10 Goal status: Improved     PLAN:   PT FREQUENCY: 2x/week   PT DURATION: 8 weeks   PLANNED INTERVENTIONS: Therapeutic exercises, Therapeutic activity, Patient/Family education, Self Care, Dry Needling, Electrical stimulation, Spinal manipulation, Spinal mobilization, Cryotherapy, Moist heat, Taping, Traction, Ultrasound, Ionotophoresis 4mg /ml Dexamethasone, Manual therapy, and Re-evaluation   PLAN FOR NEXT SESSION: MRI results?  Review NDI; assess response to HEP; progress therex as indicated; use of modalities, manual therapy; and TPDN as indicated    [employer] MS, PT 11/01/22 2:56 PM

## 2022-11-02 ENCOUNTER — Ambulatory Visit: Payer: Medicaid Other

## 2022-11-02 DIAGNOSIS — I480 Paroxysmal atrial fibrillation: Secondary | ICD-10-CM | POA: Diagnosis not present

## 2022-11-02 DIAGNOSIS — M25512 Pain in left shoulder: Secondary | ICD-10-CM | POA: Diagnosis not present

## 2022-11-02 DIAGNOSIS — M542 Cervicalgia: Secondary | ICD-10-CM | POA: Diagnosis not present

## 2022-11-02 DIAGNOSIS — R252 Cramp and spasm: Secondary | ICD-10-CM | POA: Diagnosis not present

## 2022-11-02 DIAGNOSIS — G8929 Other chronic pain: Secondary | ICD-10-CM | POA: Diagnosis not present

## 2022-11-02 DIAGNOSIS — R293 Abnormal posture: Secondary | ICD-10-CM | POA: Diagnosis not present

## 2022-11-02 DIAGNOSIS — M6281 Muscle weakness (generalized): Secondary | ICD-10-CM | POA: Diagnosis not present

## 2022-11-03 ENCOUNTER — Ambulatory Visit (INDEPENDENT_AMBULATORY_CARE_PROVIDER_SITE_OTHER): Payer: Medicaid Other

## 2022-11-03 DIAGNOSIS — J309 Allergic rhinitis, unspecified: Secondary | ICD-10-CM

## 2022-11-03 DIAGNOSIS — I480 Paroxysmal atrial fibrillation: Secondary | ICD-10-CM | POA: Diagnosis not present

## 2022-11-04 ENCOUNTER — Ambulatory Visit: Payer: Medicaid Other

## 2022-11-04 DIAGNOSIS — I480 Paroxysmal atrial fibrillation: Secondary | ICD-10-CM | POA: Diagnosis not present

## 2022-11-05 DIAGNOSIS — I480 Paroxysmal atrial fibrillation: Secondary | ICD-10-CM | POA: Diagnosis not present

## 2022-11-06 DIAGNOSIS — I480 Paroxysmal atrial fibrillation: Secondary | ICD-10-CM | POA: Diagnosis not present

## 2022-11-07 DIAGNOSIS — I480 Paroxysmal atrial fibrillation: Secondary | ICD-10-CM | POA: Diagnosis not present

## 2022-11-08 ENCOUNTER — Ambulatory Visit: Payer: Medicaid Other

## 2022-11-08 DIAGNOSIS — M6281 Muscle weakness (generalized): Secondary | ICD-10-CM

## 2022-11-08 DIAGNOSIS — M542 Cervicalgia: Secondary | ICD-10-CM | POA: Diagnosis not present

## 2022-11-08 DIAGNOSIS — G8929 Other chronic pain: Secondary | ICD-10-CM | POA: Diagnosis not present

## 2022-11-08 DIAGNOSIS — M25512 Pain in left shoulder: Secondary | ICD-10-CM | POA: Diagnosis not present

## 2022-11-08 DIAGNOSIS — R252 Cramp and spasm: Secondary | ICD-10-CM | POA: Diagnosis not present

## 2022-11-08 DIAGNOSIS — R293 Abnormal posture: Secondary | ICD-10-CM | POA: Diagnosis not present

## 2022-11-08 DIAGNOSIS — I480 Paroxysmal atrial fibrillation: Secondary | ICD-10-CM | POA: Diagnosis not present

## 2022-11-08 NOTE — Therapy (Signed)
OUTPATIENT PHYSICAL THERAPY TREATMENT NOTE   Patient Name: Jason Stokes MRN: [id number] DOB:1960-08-09, 62 y.o., male Today's Date: 11/08/2022  PCP: Hoy Register, MD   REFERRING PROVIDER: Coletta Memos, MD; Annell Greening, MD  END OF SESSION:   PT End of Session - 11/08/22 0907     Visit Number 14    Number of Visits 21    Date for PT Re-Evaluation 12/02/22    Authorization Type MEDICAID [place] ACCESS    Authorization Time Period Submitted for 8 visits starting 11/07/22  [id number]#[id number]   Approved 12 PT visits from 09/20/22-10/31/22    Authorization - Visit Number 10    Authorization - Number of Visits 13    Progress Note Due on Visit 20    PT Start Time 0900    PT Stop Time 0930    PT Time Calculation (min) 30 min    Activity Tolerance Patient limited by pain;Patient tolerated treatment well    Behavior During Therapy [facility] for tasks assessed/performed               Past Medical History:  Diagnosis Date   Anemia    Aneurysm of right internal iliac artery    a.) s/p embolization 08/19/2020: 29 mm RIGHT internal iliac artery aneurysm   Anxiety    Aortic atherosclerosis    Bilateral carpal tunnel syndrome 01/10/2018   Bipolar disorder    Chorioretinal inflammation of both eyes    a.) on azothioprine   Chronic lower back pain    Coronary artery calcification seen on CT scan    a.) cCTA 04/21/2022: Ca score 20.6 (61st percentile for age/sex match control)   DDD (degenerative disc disease), cervical    Depression    Diastolic dysfunction    a.) TTE 01/13/2021: EF 60-65%, mod LVH, triv MR, G1DD   GERD (gastroesophageal reflux disease)    Hepatic steatosis    History of alcohol abuse    History of nuclear stress test    Myoview 10/16: EF 50%, diaphragmatic attenuation, no ischemia, low risk   Hypertension    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   a.) CT head 12/25/2012 --> RIGHT basal ganglia hypoattenuation related to remote lacunar infarct   Lipoma    Long term (current)  use of anticoagulants    a.) apixaban   Long-term current use of immunomodulator    a.) on azothioprine for peripheral focal chorioretinal inflammation (both eyes)   Marijuana use    Migraine    Moderate persistent asthma with acute exacerbation 05/02/2018   PAF (paroxysmal atrial fibrillation) 03/27/2015   a.) CHA2DS2VASc = 5 (HTN, CVA x2, vascular disease history, T2DM);  b.) s/p ablation 10/02/2015; c.) s/p ablation 12/10/2015; d.) s/p DCCV (200 J x 1) 12/11/2015; e.) s/p ablation 04/28/2022; f.) rate/rhythm maintained on oral diltiazem + carvedilol; chronically anticoagulated with apixaban   Pilonidal cyst    Schizophrenia    Sciatica neuralgia    T2DM (type 2 diabetes mellitus)    Thoracic aortic ectasia 01/13/2021   a.) TTE 01/13/2021: Ao root 38 mm, asc Ao 39 mm   Past Surgical History:  Procedure Laterality Date   ATRIAL FIBRILLATION ABLATION  09/22/2015   ATRIAL FIBRILLATION ABLATION N/A 04/28/2022   Procedure: ATRIAL FIBRILLATION ABLATION;  Surgeon: Regan Lemming, MD;  Location: [facility];  Service: Cardiovascular;  Laterality: N/A;   CATARACT EXTRACTION Bilateral    CYST EXCISION  1996-97   surgery back of head    ELECTROPHYSIOLOGIC STUDY N/A 09/22/2015  Procedure: Atrial Fibrillation Ablation;  Surgeon: Will Jorja Loa, MD;  Location: [facility];  Service: Cardiovascular;  Laterality: N/A;   ELECTROPHYSIOLOGIC STUDY N/A 12/10/2015   Procedure: Atrial Fibrillation Ablation;  Surgeon: Will Jorja Loa, MD;  Location: [facility];  Service: Cardiovascular;  Laterality: N/A;   ELECTROPHYSIOLOGIC STUDY N/A 12/11/2015   Procedure: Cardioversion;  Surgeon: Will Jorja Loa, MD;  Location: [facility];  Service: Cardiovascular;  Laterality: N/A;   EMBOLIZATION Right 08/19/2020   Procedure: EMBOLIZATION;  Surgeon: Leonie Douglas, MD;  Location: [facility];  Service: Cardiovascular;  Laterality: Right;  hypogastric    EXCISION MASS HEAD N/A 01/06/2017   Procedure: EXCISION MASS FOREHEAD;  Surgeon: Glenna Fellows, MD;  Location: [facility];  Service: Plastics;  Laterality: N/A;   EXCISION MASS UPPER EXTREMETIES Right 08/08/2022   Procedure: EXCISION MASS RIGHT FOREARM;  Surgeon: Betha Loa, MD;  Location: [facility];  Service: Orthopedics;  Laterality: Right;  45 MIN   GANGLION CYST EXCISION Left    INTERCOSTAL NERVE BLOCK  07/19/2003   KNEE ARTHROSCOPY Right 07/18/2014   MASS EXCISION N/A 01/25/2021   Procedure: EXCISION SUBCUTANEOUS VS SUBFASCIAL MASS TORSO 3CM;  Surgeon: Glenna Fellows, MD;  Location: [facility];  Service: Plastics;  Laterality: N/A;   PILONIDAL CYST EXCISION N/A 09/13/2022   Procedure: CYST EXCISION PILONIDAL EXTENSIVE;  Surgeon: Henrene Dodge, MD;  Location: [facility];  Service: General;  Laterality: N/A;   Patient Active Problem List   Diagnosis Date Noted   Complete tear of left rotator cuff 10/28/2022   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   Pilonidal cyst 09/13/2022   Aortic atherosclerosis 06/07/2022   Hypercoagulable state due to paroxysmal atrial fibrillation 03/23/2021   Type 2 diabetes mellitus with hyperglycemia, without long-term current use of insulin 02/10/2021   Neck pain on left side 03/06/2019   Musculoskeletal chest pain 07/24/2018   Asthma, mild intermittent 05/02/2018   Allergic rhinitis caused by mold 05/02/2018   Tobacco use 05/02/2018   Bilateral carpal tunnel syndrome 01/10/2018   Stroke    Schizophrenia    Migraine    History of nuclear stress test    History of alcohol abuse    GERD (gastroesophageal reflux disease)    Dysrhythmia    Chronic lower back pain    Anxiety    Arthritis of knee, right 04/12/2017   Bipolar disorder 04/03/2017   Lipoma of forehead 09/22/2016   Trigger ring finger of right hand 06/22/2016   Paroxysmal atrial fibrillation    AF (atrial fibrillation) 12/10/2015    Eczema 07/10/2015   History of CVA (cerebrovascular accident) 05/04/2015   Tear of medial meniscus of right knee 03/18/2015   Chronic pain of right knee 03/12/2015   Other and unspecified hyperlipidemia 11/25/2013   Nasal congestion 11/25/2013   Sinusitis, chronic 05/09/2013   Chronic low back pain 10/12/2012   Lumbar radiculopathy 10/12/2012   Hypertension 10/12/2012   Depression 10/12/2012   Insomnia 10/12/2012    REFERRING DIAG: M47.22 (ICD-10-CM) - Other spondylosis with radiculopathy, cervical region; M75.122 (ICD-10-CM) - Complete tear of left rotator cuff, unspecified whether traumatic; M75.02 (ICD-10-CM) - Adhesive capsulitis of left shoulder   THERAPY DIAG:  Cervicalgia  Abnormal posture  Muscle weakness (generalized)  Chronic left shoulder pain  Rationale for Evaluation and Treatment rehabilitation  PERTINENT HISTORY:  Migraines, bipolar disorder, hx of bilat carpal tunnel, PAF  PRECAUTIONS: Left shoulder rotator cuff tear and  biceps tendinosis with adhesive capsulitis. Passive ROM please.    WEIGHT BEARING RESTRICTIONS: No  SUBJECTIVE:                                                                                                                                                                                      SUBJECTIVE STATEMENT:  Pt reports his R neck and shoulder have been bothering him. Pt notes he has been trying to hold his L arm up after assisting with the R. PAIN:  Are you having pain? Yes: NPRS scale: R 8/10, L 5/10  Pain location: neck pain R>L Pain description: Throb, ache Aggravating factors: Turning my head  Relieving factors: Not sure, heat and cold packs don't seem to help   OBJECTIVE: (objective measures completed at initial evaluation unless otherwise dated)   DIAGNOSTIC FINDINGS:                        CT scan 07/27/22 IMPRESSION: 1. No acute fracture or traumatic listhesis of the cervical spine. 2. Degenerative disc disease  most pronounced at the C3-4 and C5-6 levels, without interval progression from recent previous CT and MRI.  MR Lt Shoulder 10/25/22 IMPRESSION: 1. Full-thickness tear of the mid to anterior supraspinatus tendon footprint measuring up to 14 mm in AP dimension. 2. Tiny, shallow articular sided tear of the distal critical zone of the mid to anterior infraspinatus tendon measuring only 3 mm in AP dimension. 3. Partial-thickness tear of the midsubstance fibers of the superior subscapularis tendon in a region measuring up to 9 mm in craniocaudal dimension. 4. Mild-to-moderate long head of the biceps tendinosis proximal to the bicipital groove. The superior subscapularis tendon tear allows the long head of the biceps tendon to be partially perched on the anterior superior aspect of the lesser tuberosity as the biceps tendon enters the bicipital groove. 5. Moderate to severe degenerative changes of the acromioclavicular joint. Type III acromion.   PATIENT SURVEYS:  NDI=41, 82% complete disability   COGNITION: Overall cognitive status: Within functional limits for tasks assessed   SENSATION: WFL   POSTURE: rounded shoulders and forward head   PALPATION: TTP of the cervical paraspinals and upper trap with increased muscle tension, L>R   CERVICAL ROM:    Active ROM A/PROM (deg) eval AROM 09/16/22 AROM 10/04/22 AROM 10/13/22 AROM 10/20/22  Flexion 35 pulling posterior bilat 32   40  Extension 40 pulling R lateral 40   40  Right lateral flexion 15 pulling bilat 15   18  Left lateral flexion 15 " needs to pop" 15   26  Right rotation 30 puling L lateral 30  30 40 30  Left rotation 40 pulling R lateral 38 42 50 40   (Blank rows = not tested)   UPPER EXTREMITY ROM: Passively able to lift arms to 110d bilat before report of pain as limitation Active ROM Right eval Left eval PROM LEFT 09/29/22 Right 10/11/22  Right 10/20/22  Shoulder flexion 90 90 145 140   Shoulder extension          Shoulder abduction     145 120 130  Shoulder adduction         Shoulder extension         Shoulder internal rotation     50    Shoulder external rotation     55    Elbow flexion         Elbow extension         Wrist flexion         Wrist extension         Wrist ulnar deviation         Wrist radial deviation         Wrist pronation         Wrist supination          (Blank rows = not tested)   UPPER EXTREMITY MMT: Grossly 4+ to 5/5 and equal bilat. Neck weakness noted per poor posture MMT Right eval Left eval  Shoulder flexion      Shoulder extension      Shoulder abduction      Shoulder adduction      Shoulder extension      Shoulder internal rotation      Shoulder external rotation      Middle trapezius      Lower trapezius      Elbow flexion      Elbow extension      Wrist flexion      Wrist extension      Wrist ulnar deviation      Wrist radial deviation      Wrist pronation      Wrist supination      Grip strength       (Blank rows = not tested)   CERVICAL SPECIAL TESTS:  Spurling's test positive; Distraction test positive   FUNCTIONAL TESTS:  NT  UPPER EXTREMITY EVALUATION: 11/02/22  UPPER EXTREMITY ROM:   Passive ROM Right eval Left eval  Shoulder flexion A 130, P155 P150 pain at end range  Shoulder extension    Shoulder abduction A125, P155 P135 pain at end range  Shoulder adduction    Shoulder internal rotation A L1, P50 P50 pain at end range  Shoulder external rotation A T4, P90 P70 pain at end reange  Elbow flexion    Elbow extension    Wrist flexion    Wrist extension    Wrist ulnar deviation    Wrist radial deviation    Wrist pronation    Wrist supination    (Blank rows = not tested)  TODAY'S TREATMENT:  [facility] Adult PT Treatment:                                                DATE: 11/08/22 Manual Therapy: STM to the R cervical paraspinals and upper trap c MTPR Manual cervical traction UPAs grade 3 mobs Modalities: Moist heat to  the neck and upper back x  15 mins  [facility] Adult PT Treatment:                                                DATE: 11/02/22 Therapeutic Exercise: Standing Shoulder and Trunk Flexion at Table 5 reps - 10 hold Seated Shoulder External Rotation PROM on Table 5 reps - 10 hold Updated HEP  [facility] Adult PT Treatment:                                                DATE: 10/20/22 Therapeutic Exercise: Seated upper trap stretch Seated levator stretch Seated cervical retraction Supine DNF Manual Therapy: Passive neck rotation, side bending, scalenes stretch, distraction Modalities: IFC with HMP x 15 min  [facility] Adult PT Treatment:                                                DATE: 10/13/22 Therapeutic Exercise: Supine cervical retraction x10 3"  Supine cervical lift offs x10 5" Seated Cervical Rotation AROM 3 reps - 15 hold Gentle Levator Scapulae Stretch 3 reps - 15 hold Manual Therapy: STM and DTM to the cervical paraspinals and upper trap Cervical manual traction Skilled palapation to identify TrPs and taut muscle bands Trigger Point Dry Needling Treatment: Pre-treatment instruction: Patient instructed on dry needling rationale, procedures, and possible side effects including pain during treatment (achy,cramping feeling), bruising, drop of blood, lightheadedness, nausea, sweating. Patient Consent Given: Yes Education handout provided: Yes Muscles treated: Bilat upper trap and cervical paraspinals and multifidi Needle size and number: .30x34mm x 4 Electrical stimulation performed: Yes Parameters: mA 20 pps, intensity as tolerated Treatment response/outcome: Twitch response elicited Post-treatment instructions: Patient instructed to expect possible mild to moderate muscle soreness later today and/or tomorrow. Patient instructed in methods to reduce muscle soreness and to continue prescribed HEP. If patient was dry needled over the lung field, patient was instructed on signs and symptoms of  pneumothorax and, however unlikely, to see immediate medical attention should they occur. Patient was also educated on signs and symptoms of infection and to seek medical attention should they occur. Patient verbalized understanding of these instructions and education.   PATIENT EDUCATION:  Education details: Eval findings, POC, HEP, self care  Person educated: Patient Education method: Explanation, Demonstration, Tactile cues, Verbal cues, and Handouts Education comprehension: verbalized understanding, returned demonstration, verbal cues required, and tactile cues required   HOME EXERCISE PROGRAM: Access Code: [email or web address] URL: [email or web address] Date: 11/02/2022 Prepared by: Joellyn Rued  Exercises - Seated Cervical Retraction  - 6 x daily - 7 x weekly - 1 sets - 3-5 reps - 3 hold - Supine Deep Neck Flexor Training - Hold  - 1 x daily - 7 x weekly - 1 sets - 10 reps - 5 hold - Seated Scapular Retraction  - 6 x daily - 7 x weekly - 1 sets - 3-5 reps - 3 hold - Seated Cervical Rotation AROM  - 6 x daily - 7 x weekly - 1 sets - 3 reps - 15 hold - Gentle Levator Scapulae Stretch  - 1 x daily - 7 x  weekly - 1 sets - 3 reps - 15 hold - Standing Row with Anchored Resistance  - 1 x daily - 7 x weekly - 3 sets - 10 reps - Shoulder extension with resistance - Neutral  - 1 x daily - 7 x weekly - 3 sets - 10 reps - Standing Shoulder and Trunk Flexion at Table  - 2 x daily - 7 x weekly - 1 sets - 5 reps - 10 hold - Seated Shoulder External Rotation PROM on Table  - 1 x daily - 7 x weekly - 1 sets - 5 reps - 10 hold   ASSESSMENT:   CLINICAL IMPRESSION: PT appt was shortened due to pt being 13 mins late for appt. PT was completed to address R cervical and upper shoulder pain per manual techniques. Pt found all manual techniques to helpful with decreasing his pain. Moist heat was applied at the end of the session to further address pain. Pt was advised to only complete prescribed therex for  his L shoulder to avoid further injury of his L rotator cuff. Pt voiced understanding. Pt tolerated PT today without adverse effects. Pt will continue to benefit from skilled PT to address impairments for improved function with less pain.   OBJECTIVE IMPAIRMENTS: decreased activity tolerance, decreased ROM, decreased strength, increased muscle spasms, impaired flexibility, impaired UE functional use, postural dysfunction, obesity, and pain.    ACTIVITY LIMITATIONS: carrying, lifting, bending, sitting, sleeping, dressing, reach over head, and caring for others   PARTICIPATION LIMITATIONS: meal prep, cleaning, laundry, and driving   PERSONAL FACTORS: Age, Fitness, Past/current experiences, Time since onset of injury/illness/exacerbation, and 1 comorbidity: high BMI  are also affecting patient's functional outcome.    REHAB POTENTIAL: Good   CLINICAL DECISION MAKING: Evolving/moderate complexity   EVALUATION COMPLEXITY: Moderate     GOALS:     SHORT TERM GOALS: Target date: 09/16/22   Pt will be Ind in an initial HEP Baseline: initial Goal status: MET 09/16/22   2.  Pt will voice understanding of measures to assist in pain reduction Baseline: Initial  Goal status: MET 09/16/22, use of heat and cold packs, TENs unit   LONG TERM GOALS: Target date: 12/02/22   Pt will be Ind in a final HEP to maintain achieved LOF  Baseline: initiated Goal status: Ongoing   2.  Pt will be able to demonstrate proper sitting posture Baseline: Poor posture 10/04/22: poor posture 10/20/22: continues to require cues  Goal status: Ongoing   3.  Increase cervical side bending and and rotation for improved neck function, especially with driving Baseline: see flow sheets 10/11/22: reports improvement in ability to turn head when driving  1/61/09; see flow sheets Goal status: Improved   4.  Pt will demonstrate DNF endurance of 20" with a single attempt Baseline: NT 09/22/22: 25 sec Goal status: MET   5.  Pt  will report neck pain of 4/10 or less for improved neck function and QOL Baseline: 8/10 Status: R 5/10; L 9/10 Goal status: Improved R  6. Increase Pt's L shoulder PROM for abd by 15d and ER by 10d for improved ROM in preparation for possible RC surgery  Baseline:PROM Abd 135d, ER 70  Goal Status: Initial (11/02/22)  PLAN:   PT FREQUENCY: 2x/week   PT DURATION: 4 weeks   PLANNED INTERVENTIONS: Therapeutic exercises, Therapeutic activity, Patient/Family education, Self Care, Dry Needling, Electrical stimulation, Spinal manipulation, Spinal mobilization, Cryotherapy, Moist heat, Taping, Traction, Ultrasound, Ionotophoresis /ml Dexamethasone, Manual therapy, and  Re-evaluation   PLAN FOR NEXT SESSION: MRI results?  Review NDI; assess response to HEP; progress therex as indicated; use of modalities, manual therapy; and TPDN as indicated    [employer] MS, PT 11/08/22 4:28 PM

## 2022-11-09 DIAGNOSIS — I480 Paroxysmal atrial fibrillation: Secondary | ICD-10-CM | POA: Diagnosis not present

## 2022-11-09 NOTE — Therapy (Signed)
OUTPATIENT PHYSICAL THERAPY TREATMENT NOTE   Patient Name: MERION GRIMALDO MRN: [id number] DOB:April 12, 1961, 62 y.o., male Today's Date: 11/10/2022  PCP: Hoy Register, MD   REFERRING PROVIDER: Coletta Memos, MD; Annell Greening, MD  END OF SESSION:   PT End of Session - 11/10/22 0933     Visit Number 15    Number of Visits 21    Date for PT Re-Evaluation 12/02/22    Authorization Type MEDICAID [place] ACCESS    Authorization Time Period Submitted for 8 visits starting 11/07/22  WUJW#[id number]   Approved 12 PT visits from 09/20/22-10/31/22    Authorization - Visit Number 11    Authorization - Number of Visits 21    Progress Note Due on Visit 20    PT Start Time 0932    PT Stop Time 1015    PT Time Calculation (min) 43 min    Activity Tolerance Patient limited by pain;Patient tolerated treatment well    Behavior During Therapy [facility] for tasks assessed/performed                Past Medical History:  Diagnosis Date   Anemia    Aneurysm of right internal iliac artery    a.) s/p embolization 08/19/2020: 29 mm RIGHT internal iliac artery aneurysm   Anxiety    Aortic atherosclerosis    Bilateral carpal tunnel syndrome 01/10/2018   Bipolar disorder    Chorioretinal inflammation of both eyes    a.) on azothioprine   Chronic lower back pain    Coronary artery calcification seen on CT scan    a.) cCTA 04/21/2022: Ca score 20.6 (61st percentile for age/sex match control)   DDD (degenerative disc disease), cervical    Depression    Diastolic dysfunction    a.) TTE 01/13/2021: EF 60-65%, mod LVH, triv MR, G1DD   GERD (gastroesophageal reflux disease)    Hepatic steatosis    History of alcohol abuse    History of nuclear stress test    Myoview 10/16: EF 50%, diaphragmatic attenuation, no ischemia, low risk   Hypertension    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   a.) CT head 12/25/2012 --> RIGHT basal ganglia hypoattenuation related to remote lacunar infarct   Lipoma    Long term  (current) use of anticoagulants    a.) apixaban   Long-term current use of immunomodulator    a.) on azothioprine for peripheral focal chorioretinal inflammation (both eyes)   Marijuana use    Migraine    Moderate persistent asthma with acute exacerbation 05/02/2018   PAF (paroxysmal atrial fibrillation) 03/27/2015   a.) CHA2DS2VASc = 5 (HTN, CVA x2, vascular disease history, T2DM);  b.) s/p ablation 10/02/2015; c.) s/p ablation 12/10/2015; d.) s/p DCCV (200 J x 1) 12/11/2015; e.) s/p ablation 04/28/2022; f.) rate/rhythm maintained on oral diltiazem + carvedilol; chronically anticoagulated with apixaban   Pilonidal cyst    Schizophrenia    Sciatica neuralgia    T2DM (type 2 diabetes mellitus)    Thoracic aortic ectasia 01/13/2021   a.) TTE 01/13/2021: Ao root 38 mm, asc Ao 39 mm   Past Surgical History:  Procedure Laterality Date   ATRIAL FIBRILLATION ABLATION  09/22/2015   ATRIAL FIBRILLATION ABLATION N/A 04/28/2022   Procedure: ATRIAL FIBRILLATION ABLATION;  Surgeon: Regan Lemming, MD;  Location: [facility];  Service: Cardiovascular;  Laterality: N/A;   CATARACT EXTRACTION Bilateral    CYST EXCISION  1996-97   surgery back of head    ELECTROPHYSIOLOGIC STUDY N/A 09/22/2015  Procedure: Atrial Fibrillation Ablation;  Surgeon: Will Jorja Loa, MD;  Location: [facility];  Service: Cardiovascular;  Laterality: N/A;   ELECTROPHYSIOLOGIC STUDY N/A 12/10/2015   Procedure: Atrial Fibrillation Ablation;  Surgeon: Will Jorja Loa, MD;  Location: [facility];  Service: Cardiovascular;  Laterality: N/A;   ELECTROPHYSIOLOGIC STUDY N/A 12/11/2015   Procedure: Cardioversion;  Surgeon: Will Jorja Loa, MD;  Location: [facility];  Service: Cardiovascular;  Laterality: N/A;   EMBOLIZATION Right 08/19/2020   Procedure: EMBOLIZATION;  Surgeon: Leonie Douglas, MD;  Location: [facility];  Service: Cardiovascular;  Laterality: Right;  hypogastric    EXCISION MASS HEAD N/A 01/06/2017   Procedure: EXCISION MASS FOREHEAD;  Surgeon: Glenna Fellows, MD;  Location: [facility];  Service: Plastics;  Laterality: N/A;   EXCISION MASS UPPER EXTREMETIES Right 08/08/2022   Procedure: EXCISION MASS RIGHT FOREARM;  Surgeon: Betha Loa, MD;  Location: [facility];  Service: Orthopedics;  Laterality: Right;  45 MIN   GANGLION CYST EXCISION Left    INTERCOSTAL NERVE BLOCK  07/19/2003   KNEE ARTHROSCOPY Right 07/18/2014   MASS EXCISION N/A 01/25/2021   Procedure: EXCISION SUBCUTANEOUS VS SUBFASCIAL MASS TORSO 3CM;  Surgeon: Glenna Fellows, MD;  Location: [facility];  Service: Plastics;  Laterality: N/A;   PILONIDAL CYST EXCISION N/A 09/13/2022   Procedure: CYST EXCISION PILONIDAL EXTENSIVE;  Surgeon: Henrene Dodge, MD;  Location: [facility];  Service: General;  Laterality: N/A;   Patient Active Problem List   Diagnosis Date Noted   Complete tear of left rotator cuff 10/28/2022   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   Pilonidal cyst 09/13/2022   Aortic atherosclerosis 06/07/2022   Hypercoagulable state due to paroxysmal atrial fibrillation 03/23/2021   Type 2 diabetes mellitus with hyperglycemia, without long-term current use of insulin 02/10/2021   Neck pain on left side 03/06/2019   Musculoskeletal chest pain 07/24/2018   Asthma, mild intermittent 05/02/2018   Allergic rhinitis caused by mold 05/02/2018   Tobacco use 05/02/2018   Bilateral carpal tunnel syndrome 01/10/2018   Stroke    Schizophrenia    Migraine    History of nuclear stress test    History of alcohol abuse    GERD (gastroesophageal reflux disease)    Dysrhythmia    Chronic lower back pain    Anxiety    Arthritis of knee, right 04/12/2017   Bipolar disorder 04/03/2017   Lipoma of forehead 09/22/2016   Trigger ring finger of right hand 06/22/2016   Paroxysmal atrial fibrillation    AF (atrial fibrillation)  12/10/2015   Eczema 07/10/2015   History of CVA (cerebrovascular accident) 05/04/2015   Tear of medial meniscus of right knee 03/18/2015   Chronic pain of right knee 03/12/2015   Other and unspecified hyperlipidemia 11/25/2013   Nasal congestion 11/25/2013   Sinusitis, chronic 05/09/2013   Chronic low back pain 10/12/2012   Lumbar radiculopathy 10/12/2012   Hypertension 10/12/2012   Depression 10/12/2012   Insomnia 10/12/2012    REFERRING DIAG: M47.22 (ICD-10-CM) - Other spondylosis with radiculopathy, cervical region; M75.122 (ICD-10-CM) - Complete tear of left rotator cuff, unspecified whether traumatic; M75.02 (ICD-10-CM) - Adhesive capsulitis of left shoulder   THERAPY DIAG:  Cervicalgia  Abnormal posture  Muscle weakness (generalized)  Chronic left shoulder pain  Cramp and spasm  Rationale for Evaluation and Treatment rehabilitation  PERTINENT HISTORY:  Migraines, bipolar disorder, hx of bilat carpal tunnel, PAF  PRECAUTIONS: Left shoulder  rotator cuff tear and biceps tendinosis with adhesive capsulitis. Passive ROM please.    WEIGHT BEARING RESTRICTIONS: No  SUBJECTIVE:                                                                                                                                                                                      SUBJECTIVE STATEMENT:  Pt reports his R neck feels better after each session, but the pain gradually comes back. PAIN:  Are you having pain? Yes: NPRS scale: R 8/10, L 4/10  Pain location: neck pain R>L Pain description: Throb, ache Aggravating factors: Turning my head  Relieving factors: Not sure, heat and cold packs don't seem to help   OBJECTIVE: (objective measures completed at initial evaluation unless otherwise dated)   DIAGNOSTIC FINDINGS:                        CT scan 07/27/22 IMPRESSION: 1. No acute fracture or traumatic listhesis of the cervical spine. 2. Degenerative disc disease most pronounced at  the C3-4 and C5-6 levels, without interval progression from recent previous CT and MRI.  MR Lt Shoulder 10/25/22 IMPRESSION: 1. Full-thickness tear of the mid to anterior supraspinatus tendon footprint measuring up to 14 mm in AP dimension. 2. Tiny, shallow articular sided tear of the distal critical zone of the mid to anterior infraspinatus tendon measuring only 3 mm in AP dimension. 3. Partial-thickness tear of the midsubstance fibers of the superior subscapularis tendon in a region measuring up to 9 mm in craniocaudal dimension. 4. Mild-to-moderate long head of the biceps tendinosis proximal to the bicipital groove. The superior subscapularis tendon tear allows the long head of the biceps tendon to be partially perched on the anterior superior aspect of the lesser tuberosity as the biceps tendon enters the bicipital groove. 5. Moderate to severe degenerative changes of the acromioclavicular joint. Type III acromion.   PATIENT SURVEYS:  NDI=41, 82% complete disability   COGNITION: Overall cognitive status: Within functional limits for tasks assessed   SENSATION: WFL   POSTURE: rounded shoulders and forward head   PALPATION: TTP of the cervical paraspinals and upper trap with increased muscle tension, L>R   CERVICAL ROM:    Active ROM A/PROM (deg) eval AROM 09/16/22 AROM 10/04/22 AROM 10/13/22 AROM 10/20/22  Flexion 35 pulling posterior bilat 32   40  Extension 40 pulling R lateral 40   40  Right lateral flexion 15 pulling bilat 15   18  Left lateral flexion 15 " needs to pop" 15   26  Right rotation 30 puling L lateral 30 30 40 30  Left rotation 40 pulling  R lateral 38 42 50 40   (Blank rows = not tested)   UPPER EXTREMITY ROM: Passively able to lift arms to 110d bilat before report of pain as limitation Active ROM Right eval Left eval PROM LEFT 09/29/22 Right 10/11/22  Right 10/20/22  Shoulder flexion 90 90 145 140   Shoulder extension         Shoulder abduction      145 120 130  Shoulder adduction         Shoulder extension         Shoulder internal rotation     50    Shoulder external rotation     55    Elbow flexion         Elbow extension         Wrist flexion         Wrist extension         Wrist ulnar deviation         Wrist radial deviation         Wrist pronation         Wrist supination          (Blank rows = not tested)   UPPER EXTREMITY MMT: Grossly 4+ to 5/5 and equal bilat. Neck weakness noted per poor posture MMT Right eval Left eval  Shoulder flexion      Shoulder extension      Shoulder abduction      Shoulder adduction      Shoulder extension      Shoulder internal rotation      Shoulder external rotation      Middle trapezius      Lower trapezius      Elbow flexion      Elbow extension      Wrist flexion      Wrist extension      Wrist ulnar deviation      Wrist radial deviation      Wrist pronation      Wrist supination      Grip strength       (Blank rows = not tested)   CERVICAL SPECIAL TESTS:  Spurling's test positive; Distraction test positive   FUNCTIONAL TESTS:  NT  UPPER EXTREMITY EVALUATION: 11/02/22  UPPER EXTREMITY ROM:   Passive ROM Right eval Left eval  Shoulder flexion A 130, P155 P150 pain at end range  Shoulder extension    Shoulder abduction A125, P155 P135 pain at end range  Shoulder adduction    Shoulder internal rotation A L1, P50 P50 pain at end range  Shoulder external rotation A T4, P90 P70 pain at end reange  Elbow flexion    Elbow extension    Wrist flexion    Wrist extension    Wrist ulnar deviation    Wrist radial deviation    Wrist pronation    Wrist supination    (Blank rows = not tested)  TODAY'S TREATMENT:  [facility] Adult PT Treatment:                                                DATE: 11/10/22 Therapeutic Exercise: Supine cervical retraction x10 3"  Supine cervical lift offs x5 5" Seated L Cervical Rotation AROM 2 reps - 15 hold Seated L Upper trap Stretch  2 reps - 15 hold Standing Shoulder and Trunk  Flexion at Table 5 reps - 10 hold Seated Shoulder External Rotation PROM on Table 5 reps - 10 hold Manual Therapy: Skilled palapation to identify TrPs and taut muscle bands Trigger Point Dry Needling Treatment: Pre-treatment instruction: Patient instructed on dry needling rationale, procedures, and possible side effects including pain during treatment (achy,cramping feeling), bruising, drop of blood, lightheadedness, nausea, sweating. Patient Consent Given: Yes Education handout provided: Yes Muscles treated: R upper trap and cervical paraspinals and multifidi, C3-T1 Needle size and number: .30x38mm x 4 Electrical stimulation performed: Yes Parameters: mA 20 pps, intensity as tolerated Treatment response/outcome: Twitch response elicited Post-treatment instructions: Patient instructed to expect possible mild to moderate muscle soreness later today and/or tomorrow. Patient instructed in methods to reduce muscle soreness and to continue prescribed HEP. If patient was dry needled over the lung field, patient was instructed on signs and symptoms of pneumothorax and, however unlikely, to see immediate medical attention should they occur. Patient was also educated on signs and symptoms of infection and to seek medical attention should they occur. Patient verbalized understanding of these instructions and education.   [facility] Adult PT Treatment:                                                DATE: 11/08/22 Manual Therapy: STM to the R cervical paraspinals and upper trap c MTPR Manual cervical traction UPAs grade 3 mobs Modalities: Moist heat to the neck and upper back x 15 mins  [facility] Adult PT Treatment:                                                DATE: 11/02/22 Therapeutic Exercise: Standing Shoulder and Trunk Flexion at Table 5 reps - 10 hold Seated Shoulder External Rotation PROM on Table 5 reps - 10 hold Updated HEP  [facility] Adult PT Treatment:                                                 DATE: 10/20/22 Therapeutic Exercise: Seated upper trap stretch Seated levator stretch Seated cervical retraction Supine DNF Manual Therapy: Passive neck rotation, side bending, scalenes stretch, distraction Modalities: IFC with HMP x 15 min  [facility] Adult PT Treatment:                                                DATE: 10/13/22 Therapeutic Exercise: Supine cervical retraction x10 3"  Supine cervical lift offs x10 5" Seated Cervical Rotation AROM 3 reps - 15 hold Gentle Levator Scapulae Stretch 3 reps - 15 hold Manual Therapy: STM and DTM to the cervical paraspinals and upper trap Cervical manual traction Skilled palapation to identify TrPs and taut muscle bands Trigger Point Dry Needling Treatment: Pre-treatment instruction: Patient instructed on dry needling rationale, procedures, and possible side effects including pain during treatment (achy,cramping feeling), bruising, drop of blood, lightheadedness, nausea, sweating. Patient Consent Given: Yes Education handout provided: Yes Muscles treated: Bilat upper trap and cervical paraspinals  and multifidi Needle size and number: .30x21mm x 4 Electrical stimulation performed: Yes Parameters: mA 20 pps, intensity as tolerated Treatment response/outcome: Twitch response elicited Post-treatment instructions: Patient instructed to expect possible mild to moderate muscle soreness later today and/or tomorrow. Patient instructed in methods to reduce muscle soreness and to continue prescribed HEP. If patient was dry needled over the lung field, patient was instructed on signs and symptoms of pneumothorax and, however unlikely, to see immediate medical attention should they occur. Patient was also educated on signs and symptoms of infection and to seek medical attention should they occur. Patient verbalized understanding of these instructions and education.   PATIENT EDUCATION:  Education details: Eval findings,  POC, HEP, self care  Person educated: Patient Education method: Explanation, Demonstration, Tactile cues, Verbal cues, and Handouts Education comprehension: verbalized understanding, returned demonstration, verbal cues required, and tactile cues required   HOME EXERCISE PROGRAM: Access Code: [email or web address] URL: [email or web address] Date: 11/02/2022 Prepared by: Joellyn Rued  Exercises - Seated Cervical Retraction  - 6 x daily - 7 x weekly - 1 sets - 3-5 reps - 3 hold - Supine Deep Neck Flexor Training - Hold  - 1 x daily - 7 x weekly - 1 sets - 10 reps - 5 hold - Seated Scapular Retraction  - 6 x daily - 7 x weekly - 1 sets - 3-5 reps - 3 hold - Seated Cervical Rotation AROM  - 6 x daily - 7 x weekly - 1 sets - 3 reps - 15 hold - Gentle Levator Scapulae Stretch  - 1 x daily - 7 x weekly - 1 sets - 3 reps - 15 hold - Standing Row with Anchored Resistance  - 1 x daily - 7 x weekly - 3 sets - 10 reps - Shoulder extension with resistance - Neutral  - 1 x daily - 7 x weekly - 3 sets - 10 reps - Standing Shoulder and Trunk Flexion at Table  - 2 x daily - 7 x weekly - 1 sets - 5 reps - 10 hold - Seated Shoulder External Rotation PROM on Table  - 1 x daily - 7 x weekly - 1 sets - 5 reps - 10 hold   ASSESSMENT:   CLINICAL IMPRESSION: PT was complete for mA estim to the L cervical paraspinal, multifidi, and upper traps. PT then completed postural posterior chain strengthening and cervical ROM/flexibility with focus on the R cervical muscles. Additionally, pt completed L shoulder PROM there ex. L shoulder PROM is at an appropriate range. Pt reported his R neck pain felt better and neck motion was improved. Pt tolerated PT today without adverse effects. Pt will continue to benefit from skilled PT to address impairments for improved function with less pain.   OBJECTIVE IMPAIRMENTS: decreased activity tolerance, decreased ROM, decreased strength, increased muscle spasms, impaired flexibility,  impaired UE functional use, postural dysfunction, obesity, and pain.    ACTIVITY LIMITATIONS: carrying, lifting, bending, sitting, sleeping, dressing, reach over head, and caring for others   PARTICIPATION LIMITATIONS: meal prep, cleaning, laundry, and driving   PERSONAL FACTORS: Age, Fitness, Past/current experiences, Time since onset of injury/illness/exacerbation, and 1 comorbidity: high BMI  are also affecting patient's functional outcome.    REHAB POTENTIAL: Good   CLINICAL DECISION MAKING: Evolving/moderate complexity   EVALUATION COMPLEXITY: Moderate     GOALS:     SHORT TERM GOALS: Target date: 09/16/22   Pt will be Ind in an initial HEP Baseline: initial Goal status: MET 09/16/22  2.  Pt will voice understanding of measures to assist in pain reduction Baseline: Initial  Goal status: MET 09/16/22, use of heat and cold packs, TENs unit   LONG TERM GOALS: Target date: 12/02/22   Pt will be Ind in a final HEP to maintain achieved LOF  Baseline: initiated Goal status: Ongoing   2.  Pt will be able to demonstrate proper sitting posture Baseline: Poor posture 10/04/22: poor posture 10/20/22: continues to require cues  Goal status: Ongoing   3.  Increase cervical side bending and and rotation for improved neck function, especially with driving Baseline: see flow sheets 10/11/22: reports improvement in ability to turn head when driving  1/61/09; see flow sheets Goal status: Improved   4.  Pt will demonstrate DNF endurance of 20" with a single attempt Baseline: NT 09/22/22: 25 sec Goal status: MET   5.  Pt will report neck pain of 4/10 or less for improved neck function and QOL Baseline: 8/10 Status: R 5/10; L 9/10 Goal status: Improved R  6. Increase Pt's L shoulder PROM for abd by 15d and ER by 10d for improved ROM in preparation for possible RC surgery  Baseline:PROM Abd 135d, ER 70  Goal Status: Initial (11/02/22)  PLAN:   PT FREQUENCY: 2x/week   PT DURATION: 4  weeks   PLANNED INTERVENTIONS: Therapeutic exercises, Therapeutic activity, Patient/Family education, Self Care, Dry Needling, Electrical stimulation, Spinal manipulation, Spinal mobilization, Cryotherapy, Moist heat, Taping, Traction, Ultrasound, Ionotophoresis 4mg /ml Dexamethasone, Manual therapy, and Re-evaluation   PLAN FOR NEXT SESSION: MRI results?  Review NDI; assess response to HEP; progress therex as indicated; use of modalities, manual therapy; and TPDN as indicated    [employer] MS, PT 11/10/22 11:29 AM

## 2022-11-10 ENCOUNTER — Ambulatory Visit (INDEPENDENT_AMBULATORY_CARE_PROVIDER_SITE_OTHER): Payer: Medicaid Other

## 2022-11-10 ENCOUNTER — Ambulatory Visit: Payer: Medicaid Other

## 2022-11-10 DIAGNOSIS — M542 Cervicalgia: Secondary | ICD-10-CM

## 2022-11-10 DIAGNOSIS — R252 Cramp and spasm: Secondary | ICD-10-CM

## 2022-11-10 DIAGNOSIS — J309 Allergic rhinitis, unspecified: Secondary | ICD-10-CM

## 2022-11-10 DIAGNOSIS — R293 Abnormal posture: Secondary | ICD-10-CM

## 2022-11-10 DIAGNOSIS — M6281 Muscle weakness (generalized): Secondary | ICD-10-CM | POA: Diagnosis not present

## 2022-11-10 DIAGNOSIS — G8929 Other chronic pain: Secondary | ICD-10-CM | POA: Diagnosis not present

## 2022-11-10 DIAGNOSIS — M25512 Pain in left shoulder: Secondary | ICD-10-CM | POA: Diagnosis not present

## 2022-11-10 DIAGNOSIS — I480 Paroxysmal atrial fibrillation: Secondary | ICD-10-CM | POA: Diagnosis not present

## 2022-11-11 DIAGNOSIS — I480 Paroxysmal atrial fibrillation: Secondary | ICD-10-CM | POA: Diagnosis not present
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
## 2022-11-12 DIAGNOSIS — I480 Paroxysmal atrial fibrillation: Secondary | ICD-10-CM | POA: Diagnosis not present

## 2022-11-13 DIAGNOSIS — I480 Paroxysmal atrial fibrillation: Secondary | ICD-10-CM | POA: Diagnosis not present

## 2022-11-14 ENCOUNTER — Other Ambulatory Visit (HOSPITAL_COMMUNITY): Payer: Self-pay

## 2022-11-14 DIAGNOSIS — I480 Paroxysmal atrial fibrillation: Secondary | ICD-10-CM | POA: Diagnosis not present

## 2022-11-15 ENCOUNTER — Ambulatory Visit: Payer: Medicaid Other | Attending: Family Medicine | Admitting: Family Medicine

## 2022-11-15 VITALS — BP 127/86 | HR 83 | Temp 98.2°F | Ht 73.0 in | Wt 251.2 lb

## 2022-11-15 DIAGNOSIS — Z8249 Family history of ischemic heart disease and other diseases of the circulatory system: Secondary | ICD-10-CM | POA: Diagnosis not present

## 2022-11-15 DIAGNOSIS — Z23 Encounter for immunization: Secondary | ICD-10-CM

## 2022-11-15 DIAGNOSIS — E1169 Type 2 diabetes mellitus with other specified complication: Secondary | ICD-10-CM | POA: Diagnosis not present

## 2022-11-15 DIAGNOSIS — Z79899 Other long term (current) drug therapy: Secondary | ICD-10-CM | POA: Diagnosis not present

## 2022-11-15 DIAGNOSIS — M542 Cervicalgia: Secondary | ICD-10-CM | POA: Diagnosis not present

## 2022-11-15 DIAGNOSIS — F319 Bipolar disorder, unspecified: Secondary | ICD-10-CM | POA: Insufficient documentation

## 2022-11-15 DIAGNOSIS — F1290 Cannabis use, unspecified, uncomplicated: Secondary | ICD-10-CM | POA: Diagnosis not present

## 2022-11-15 DIAGNOSIS — R600 Localized edema: Secondary | ICD-10-CM | POA: Diagnosis not present

## 2022-11-15 DIAGNOSIS — I10 Essential (primary) hypertension: Secondary | ICD-10-CM | POA: Diagnosis not present

## 2022-11-15 DIAGNOSIS — M79641 Pain in right hand: Secondary | ICD-10-CM | POA: Diagnosis not present

## 2022-11-15 DIAGNOSIS — M5416 Radiculopathy, lumbar region: Secondary | ICD-10-CM

## 2022-11-15 DIAGNOSIS — J3089 Other allergic rhinitis: Secondary | ICD-10-CM

## 2022-11-15 DIAGNOSIS — I480 Paroxysmal atrial fibrillation: Secondary | ICD-10-CM | POA: Diagnosis not present

## 2022-11-15 DIAGNOSIS — Z5941 Food insecurity: Secondary | ICD-10-CM | POA: Diagnosis not present

## 2022-11-15 DIAGNOSIS — Z87891 Personal history of nicotine dependence: Secondary | ICD-10-CM | POA: Insufficient documentation

## 2022-11-15 DIAGNOSIS — Z7984 Long term (current) use of oral hypoglycemic drugs: Secondary | ICD-10-CM

## 2022-11-15 LAB — POCT GLYCOSYLATED HEMOGLOBIN (HGB A1C): HbA1c, POC (controlled diabetic range): 6 % (ref 0.0–7.0)

## 2022-11-15 LAB — GLUCOSE, POCT (MANUAL RESULT ENTRY): POC Glucose: 114 mg/dl — AB (ref 70–99)

## 2022-11-15 MED ORDER — FUROSEMIDE 20 MG PO TABS
20.0000 mg | ORAL_TABLET | Freq: Every day | ORAL | 3 refills | Status: DC
Start: 2022-11-15 — End: 2023-09-06

## 2022-11-15 NOTE — Progress Notes (Signed)
Subjective:  Patient ID: Jason Stokes, male    DOB: 05-Oct-1960  Age: 62 y.o. MRN: [id number]  CC: Diabetes   HPI NORMAN PIACENTINI is a 62 y.o. year old male with a history of hypertension, type 2 diabetes mellitus (diet-controlled A1c 8.1), bipolar disorder, paroxysmal A. 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 He states he discontinued Metformin due to Diarrhea but he has been cutting out on sugar and reducing his portion sizes.  Does not exercise though.  He would like to see another Neurosurgeon as he is unhappy with the care from his current one with [facility].  He continues to experience low back pain and also has pain in his cervical spine.  He has had epidural spinal injections with no improvement.  Currently undergoing PT for his neck. He Complains of pain in his right hand mostly in his ring finger.  Pain is described as severe. Previously followed by pain management but was discharged due to the fact that he uses cannabis and he informs me he cannot quit cannabis that this helps with his PTSD.  He has no Asthma flares but his allergies have been flaring up. He continues to receive immunotherapy and wears a mask when he goes out. For A-fib he is currently under the care of EP. Complains of pedal edema but he has no dyspnea or chest pains.  Edema is not dependent.  Left is greater than right and he endorses previous history of left ankle surgery. 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)    a.) s/p embolization 08/19/2020: 29 mm RIGHT internal iliac artery aneurysm   Anxiety    Aortic atherosclerosis (HCC)    Bilateral carpal tunnel syndrome 01/10/2018   Bipolar disorder (HCC)    Chorioretinal inflammation  of both eyes    a.) on azothioprine   Chronic lower back pain    Coronary artery calcification seen on CT scan    a.) cCTA 04/21/2022: Ca score 20.6 (61st percentile for age/sex match control)   DDD (degenerative disc disease), cervical    Depression    Diastolic dysfunction    a.) TTE 01/13/2021: EF 60-65%, mod LVH, triv MR, G1DD   GERD (gastroesophageal reflux disease)    Hepatic steatosis    History of alcohol abuse    History of nuclear stress test    Myoview 10/16: EF 50%, diaphragmatic attenuation, no ischemia, low risk   Hypertension    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   a.) CT head 12/25/2012 --> RIGHT basal ganglia hypoattenuation related to remote lacunar infarct   Lipoma    Long term (current) use of anticoagulants    a.) apixaban   Long-term current use of immunomodulator    a.) on azothioprine for peripheral focal chorioretinal inflammation (both eyes)   Marijuana use    Migraine    Moderate persistent asthma with acute exacerbation 05/02/2018   PAF (paroxysmal atrial fibrillation) (HCC) 03/27/2015   a.) CHA2DS2VASc = 5 (HTN, CVA x2, vascular disease history, T2DM);  b.) s/p ablation 10/02/2015; c.) s/p ablation 12/10/2015; d.) s/p DCCV (200 J x 1) 12/11/2015; e.) s/p ablation 04/28/2022; f.) rate/rhythm maintained on oral diltiazem + carvedilol; chronically anticoagulated with apixaban   Pilonidal cyst  Schizophrenia (HCC)    Sciatica neuralgia    T2DM (type 2 diabetes mellitus) (HCC)    Thoracic aortic ectasia (HCC) 01/13/2021   a.) TTE 01/13/2021: Ao root 38 mm, asc Ao 39 mm    Past Surgical History:  Procedure Laterality Date   ATRIAL FIBRILLATION ABLATION  09/22/2015   ATRIAL FIBRILLATION ABLATION N/A 04/28/2022   Procedure: ATRIAL FIBRILLATION ABLATION;  Surgeon: Regan Lemming, MD;  Location: [facility];  Service: Cardiovascular;  Laterality: N/A;   CATARACT EXTRACTION Bilateral    CYST EXCISION  1996-97   surgery back of head     ELECTROPHYSIOLOGIC STUDY N/A 09/22/2015   Procedure: Atrial Fibrillation Ablation;  Surgeon: Will Jorja Loa, MD;  Location: [facility];  Service: Cardiovascular;  Laterality: N/A;   ELECTROPHYSIOLOGIC STUDY N/A 12/10/2015   Procedure: Atrial Fibrillation Ablation;  Surgeon: Will Jorja Loa, MD;  Location: [facility];  Service: Cardiovascular;  Laterality: N/A;   ELECTROPHYSIOLOGIC STUDY N/A 12/11/2015   Procedure: Cardioversion;  Surgeon: Will Jorja Loa, MD;  Location: [facility];  Service: Cardiovascular;  Laterality: N/A;   EMBOLIZATION Right 08/19/2020   Procedure: EMBOLIZATION;  Surgeon: Leonie Douglas, MD;  Location: [facility];  Service: Cardiovascular;  Laterality: Right;  hypogastric   EXCISION MASS HEAD N/A 01/06/2017   Procedure: EXCISION MASS FOREHEAD;  Surgeon: Glenna Fellows, MD;  Location: [facility];  Service: Plastics;  Laterality: N/A;   EXCISION MASS UPPER EXTREMETIES Right 08/08/2022   Procedure: EXCISION MASS RIGHT FOREARM;  Surgeon: Betha Loa, MD;  Location: [facility];  Service: Orthopedics;  Laterality: Right;  45 MIN   GANGLION CYST EXCISION Left    INTERCOSTAL NERVE BLOCK  07/19/2003   KNEE ARTHROSCOPY Right 07/18/2014   MASS EXCISION N/A 01/25/2021   Procedure: EXCISION SUBCUTANEOUS VS SUBFASCIAL MASS TORSO 3CM;  Surgeon: Glenna Fellows, MD;  Location: [facility];  Service: Plastics;  Laterality: N/A;   PILONIDAL CYST EXCISION N/A 09/13/2022   Procedure: CYST EXCISION PILONIDAL EXTENSIVE;  Surgeon: Henrene Dodge, MD;  Location: [facility];  Service: General;  Laterality: N/A;    Family History  Problem Relation Age of Onset   Heart disease Father    Schizophrenia Sister     Social History   Socioeconomic History   Marital status: Single    Spouse name: [name]   Number of children: 3   Years of education: HS   Highest education level: 11th grade  Occupational  History    Comment: disabled  Tobacco Use   Smoking status: Former    Years: 29    Types: Cigarettes    Quit date: 09/10/2022    Years since quitting: 0.1    Passive exposure: Never   Smokeless tobacco: Never   Tobacco comments:    1 cigarettes every day  06-02-2021  Vaping Use   Vaping Use: Never used  Substance and Sexual Activity   Alcohol use: Yes    Alcohol/week: 6.0 standard drinks of alcohol    Types: 6 Cans of beer per week    Comment: 6 pack weekly 05/26/22   Drug use: Yes    Types: Marijuana    Comment: sunday was last time   Sexual activity: Not Currently  Other Topics Concern   Not on file  Social History Narrative   Patient lives at home with Royal.    Patient has 3 children 2 step.    Patient has 13  years of schooling.    Patient is right handed.    Social Determinants of Health   Financial Resource Strain: Low Risk  (11/15/2022)   Overall Financial Resource Strain (CARDIA)    Difficulty of Paying Living Expenses: Not hard at all  Food Insecurity: Food Insecurity Present (11/15/2022)   Hunger Vital Sign    Worried About Running Out of Food in the Last Year: Never true    Ran Out of Food in the Last Year: Sometimes true  Transportation Needs: No Transportation Needs (11/15/2022)   PRAPARE - Administrator, Civil Service (Medical): No    Lack of Transportation (Non-Medical): No  Physical Activity: Unknown (11/15/2022)   Exercise Vital Sign    Days of Exercise per Week: 0 days    Minutes of Exercise per Session: Not on file  Stress: No Stress Concern Present (11/15/2022)   [employer] of Occupational Health - Occupational Stress Questionnaire    Feeling of Stress : Not at all  Social Connections: Socially Isolated (11/15/2022)   Social Connection and Isolation Panel [NHANES]    Frequency of Communication with Friends and Family: Twice a week    Frequency of Social Gatherings with Friends and Family: Once a week    Attends Religious  Services: Never    Database administrator or Organizations: No    Attends Engineer, structural: Not on file    Marital Status: Never married    Allergies  Allergen Reactions   Lisinopril Anaphylaxis    Swelling of lips and tongue.   Tylenol [Acetaminophen] Other (See Comments)    Acid reflux    Lyrica [Pregabalin] Palpitations    Outpatient Medications Prior to Visit  Medication Sig Dispense Refill   albuterol (VENTOLIN HFA) 108 (90 Base) MCG/ACT inhaler Inhale 2 puffs into the lungs every 4 (four) hours as needed for wheezing or shortness of breath. 54 g 1   apixaban (ELIQUIS) 5 MG TABS tablet Take 1 tablet (5 mg total) by mouth 2 (two) times daily. 60 tablet 5   atorvastatin (LIPITOR) 40 MG tablet Take 1 tablet (40 mg total) by mouth daily. (Patient taking differently: Take 40 mg by mouth every morning.) 90 tablet 1   azaTHIOprine (IMURAN) 50 MG tablet Take 100 mg by mouth every morning.     azelastine (ASTELIN) 0.1 % nasal spray 1 spray each nostril twice a day (Patient taking differently: 1 spray as needed.) 30 mL 5   brimonidine (ALPHAGAN) 0.2 % ophthalmic solution Place 1 drop into both eyes every 8 (eight) hours.     carvedilol (COREG) 25 MG tablet Take 1 tablet (25 mg total) by mouth 2 (two) times daily. 180 tablet 2   cetirizine (ZYRTEC) 10 MG tablet Take 1 tablet (10 mg total) by mouth 2 (two) times daily as needed for allergies (Can take an extra dose during flare ups.). (Patient taking differently: Take 10 mg by mouth every morning.) 60 tablet 5   Difluprednate 0.05 % EMUL Place 1 drop into both eyes 2 (two) times daily.     diltiazem (CARDIZEM CD) 180 MG 24 hr capsule TAKE 1 CAPSULE BY MOUTH EVERY DAY (Patient taking differently: 180 mg every morning. TAKE 1 CAPSULE BY MOUTH EVERY DAY) 90 capsule 2   dorzolamide-timolol (COSOPT) 22.3-6.8 MG/ML ophthalmic solution Place 1 drop into both eyes 2 (two) times daily.     DULoxetine (CYMBALTA) 60 MG capsule Take 1 capsule  (60 mg total) by mouth daily. For chronic  pain and neuropathy (Patient taking differently: Take 60 mg by mouth every morning. For chronic pain and neuropathy) 90 capsule 1   EPINEPHrine 0.3 mg/0.3 mL IJ SOAJ injection Inject 0.3 mg into the muscle as needed for anaphylaxis. 0.3 mL 1   fluticasone (FLONASE) 50 MCG/ACT nasal spray 1 spray each nostril twice a day (Patient taking differently: Place 1 spray into both nostrils as needed. 1 spray each nostril twice a day) 16 g 5   fluticasone furoate-vilanterol (BREO ELLIPTA) 200-25 MCG/ACT AEPB Inhale 1 puff into the lungs daily. (Patient taking differently: Inhale 1 puff into the lungs as needed.) 28 each 5   folic acid (FOLVITE) 1 MG tablet Take 1 mg by mouth daily.     GARLIC PO Take 1 tablet by mouth daily.     glucose blood (ACCU-CHEK GUIDE) test strip Use to check blood sugar once daily. E11.69 100 each 6   latanoprost (XALATAN) 0.005 % ophthalmic solution Place 1 drop into both eyes daily.     metFORMIN (GLUCOPHAGE) 500 MG tablet Take 1 tablet (500 mg total) by mouth 2 (two) times daily with a meal. For 1 week then increase to 2 tablets twice a day 360 tablet 1   methocarbamol (ROBAXIN) 500 MG tablet TAKE 1 TABLET BY MOUTH EVERY 8 HOURS AS NEEDED FOR MUSCLE SPASM 30 tablet 0   Misc. Devices MISC Back brace. Diagnosis chronic back pain 1 each 0   Misc. Devices MISC Left knee brace. Diagnosis L knee pain 1 each 0   montelukast (SINGULAIR) 10 MG tablet Take 1 tablet (10 mg total) by mouth at bedtime. 30 tablet 5   Olopatadine HCl (PATADAY) 0.2 % SOLN Place 1 drop into both eyes daily as needed. 2.5 mL 5   omeprazole (PRILOSEC) 20 MG capsule TAKE 1 CAPSULE BY MOUTH EVERY DAY (Patient taking differently: 20 mg every morning. TAKE 1 CAPSULE BY MOUTH EVERY DAY) 90 capsule 0   oxyCODONE 10 MG TABS Take 1 tablet (10 mg total) by mouth every 4 (four) hours as needed for severe pain. 15 tablet 0   Vitamin D3 (VITAMIN D) 25 MCG tablet Take 1,000 Units by mouth  daily.     No facility-administered medications prior to visit.     ROS Review of Systems  Constitutional:  Negative for activity change and appetite change.  HENT:  Negative for sinus pressure and sore throat.   Respiratory:  Negative for chest tightness, shortness of breath and wheezing.   Cardiovascular:  Positive for leg swelling. Negative for chest pain and palpitations.  Gastrointestinal:  Negative for abdominal distention, abdominal pain and constipation.  Genitourinary: Negative.   Musculoskeletal:        See HPI  Psychiatric/Behavioral:  Negative for behavioral problems and dysphoric mood.     Objective:  BP 127/86   Pulse 83   Temp 98.2 F (36.8 C) (Oral)   Ht 6\' 1"  (1.854 m)   Wt 251 lb 3.2 oz (113.9 kg)   SpO2 98%   BMI 33.14 kg/m      11/15/2022   10:50 AM 10/28/2022   10:41 AM 10/27/2022    2:21 PM  BP/Weight  Systolic BP 127 156 155  Diastolic BP 86 87 93  Wt. (Lbs) 251.2 248 248  BMI 33.14 kg/m2 32.72 kg/m2 32.72 kg/m2      Physical Exam Constitutional:      Appearance: He is well-developed.  Cardiovascular:     Rate and Rhythm: Normal rate.  Heart sounds: Normal heart sounds. No murmur heard. Pulmonary:     Effort: Pulmonary effort is normal.     Breath sounds: Normal breath sounds. No wheezing or rales.  Chest:     Chest wall: No tenderness.  Abdominal:     General: Bowel sounds are normal. There is no distension.     Palpations: Abdomen is soft. There is no mass.     Tenderness: There is no abdominal tenderness.  Musculoskeletal:     Right lower leg: No edema.     Left lower leg: Edema (1+) present.     Comments: Tenderness on palpation of lumbar spine Able to make a fist in both hands but he does have tenderness in right fourth MCP joint  Neurological:     Mental Status: He is alert and oriented to person, place, and time.  Psychiatric:        Mood and Affect: Mood normal.        Latest Ref Rng & Units 06/23/2022   10:57 AM  05/26/2022    2:53 PM 04/15/2022   12:26 PM  CMP  Glucose 70 - 99 mg/dL 244  010  272   BUN 8 - 23 mg/dL 5  8  14    Creatinine 0.61 - 1.24 mg/dL 5.36  6.44  0.34   Sodium 135 - 145 mmol/L 140  134  141   Potassium 3.5 - 5.1 mmol/L 3.5  3.8  3.9   Chloride 98 - 111 mmol/L 110  100  103   CO2 22 - 32 mmol/L 22  22  24    Calcium 8.9 - 10.3 mg/dL 8.9  9.2  9.6   Total Protein 6.5 - 8.1 g/dL 6.4  6.9    Total Bilirubin 0.3 - 1.2 mg/dL 0.4  1.0    Alkaline Phos 38 - 126 U/L 110  466    AST 15 - 41 U/L 16  52    ALT 0 - 44 U/L 13  69      Lipid Panel     Component Value Date/Time   CHOL 156 08/22/2022 1110   TRIG 149 08/22/2022 1110   HDL 29 (L) 08/22/2022 1110   CHOLHDL 5.2 (H) 05/07/2018 1004   CHOLHDL 5.4 10/17/2013 1042   VLDL 36 10/17/2013 1042   LDLCALC 100 (H) 08/22/2022 1110    CBC    Component Value Date/Time   WBC 5.0 09/06/2022 1243   RBC 5.22 09/06/2022 1243   HGB 12.0 (L) 09/06/2022 1243   HGB 12.8 (L) 04/15/2022 1226   HCT 39.1 09/06/2022 1243   HCT 40.6 04/15/2022 1226   PLT 155 09/06/2022 1243   PLT 168 04/15/2022 1226   MCV 74.9 (L) 09/06/2022 1243   MCV 86 04/15/2022 1226   MCH 23.0 (L) 09/06/2022 1243   MCHC 30.7 09/06/2022 1243   RDW 15.3 09/06/2022 1243   RDW 16.4 (H) 04/15/2022 1226   LYMPHSABS 1.6 08/08/2019 1430   LYMPHSABS 2.0 09/13/2016 0832   MONOABS 0.7 08/08/2019 1430   EOSABS 0.2 08/08/2019 1430   EOSABS 0.1 09/13/2016 0832   BASOSABS 0.0 08/08/2019 1430   BASOSABS 0.0 09/13/2016 7425    Lab Results  Component Value Date   HGBA1C 6.0 11/15/2022    Assessment & Plan:  1. Type 2 diabetes mellitus with other specified complication, without long-term current use of insulin (HCC) Controlled with A1c of 6.0 down from 8.1 previously He declines taking metformin Continue to work on lifestyle modification Counseled  on Diabetic diet, my plate method, 161 minutes of moderate intensity exercise/week Blood sugar logs with fasting goals of  80-120 mg/dl, random of less than 096 and in the event of sugars less than 60 mg/dl or greater than 045 mg/dl encouraged to notify the clinic. Advised on the need for annual eye exams, annual foot exams, Pneumonia vaccine. - POCT glucose (manual entry) - POCT glycosylated hemoglobin (Hb A1C) - Basic Metabolic Panel; Future  2. Allergic rhinitis due to other allergic trigger, unspecified seasonality Stable with intermittent flares Continue immunotherapy Advised to use a mask when he is out and about  3. Paroxysmal atrial fibrillation (HCC) Currently in sinus rhythm Continue with Xarelto and Cardizem Follow-up with EP  4. Lumbar radiculopathy Uncontrolled Status post epidural spinal injection in the past No longer followed by pain management - Ambulatory referral to Neurosurgery  5. Pain of right hand Might benefit from cortisone injection - Ambulatory referral to Orthopedics  6. Essential hypertension Controlled Continue antihypertensives Counseled on blood pressure goal of less than 130/80, low-sodium, DASH diet, medication compliance, 150 minutes of moderate intensity exercise per week. Discussed medication compliance, adverse effects.  7. Pedal edema Encouraged to comply with a low-sodium diet, elevate feet, use compression stockings - furosemide (LASIX) 20 MG tablet; Take 1 tablet (20 mg total) by mouth daily.  Dispense: 30 tablet; Refill: 3   Health Care Maintenance: Up-to-date on colonoscopy and have sent request to [facility] to obtain records  Meds ordered this encounter  Medications   furosemide (LASIX) 20 MG tablet    Sig: Take 1 tablet (20 mg total) by mouth daily.    Dispense:  30 tablet    Refill:  3    Follow-up: Return in about 6 months (around 05/17/2023) for Chronic medical conditions.       Hoy Register, MD, FAAFP. [facility] [place], [place] [phone]   11/15/2022, 12:02 PM

## 2022-11-15 NOTE — Patient Instructions (Signed)
Potassium Content of Foods  Potassium is a mineral found in many foods and drinks. It can affect how the heart works, affect blood pressure, and keep fluids and electrolytes balanced in the body. It is important not to have too much potassium (hyperkalemia) or too little potassium (hypokalemia) in the body, especially in the blood. Potassium is naturally found in many different types of whole foods, such as fruits, vegetables, meat, and dairy products. Processed foods tend to be lower in potassium. The amount of potassium you need each day depends on your age and any medical conditions you may have. General recommendations are: Females aged 19 and older: 2,600 mg per day. Males aged 19 and older: 3,400 mg per day. Talk with your health care provider or dietitian about how much potassium you need. What foods are high in potassium? Below are examples of foods that have greater than 200 mg of potassium per serving. Fruits Orange -- 1 medium (130 g) has 230 mg of potassium. Banana -- 1 medium (120 g) has 420 mg of potassium. Cantaloupe, chunks -- 1 cup (160 g) has 430 mg of potassium. Vegetables Potato, baked, without skin -- 1 medium (170 g) has 600 mg of potassium. Broccoli, chopped, cooked --  cup (77.5 g) has 230 mg of potassium. Tomato, chopped or sliced -- 1 cup (152 g) has 400 mg of potassium. Grains Cereal, bran with raisins -- 1 cup (59 g) has 360 mg of potassium. Granola with almonds --  cup (82 g) has 220 mg of potassium. Meats and other proteins Ground beef patty -- 4 ounces (113 g) has 240 mg of potassium. Kidney beans, boiled --  cup (130 g) has 350 mg of potassium. Almonds -- 1 ounce (approximately 22 nuts or 28 g) has 200 mg of potassium. Dairy Cow's milk, 1% -- 1 cup (237 mL) has 360 mg of potassium. Plain vanilla low-fat yogurt --  cup (184 g) has 220 mg of potassium. The items listed above may not be a complete list of foods high in potassium. Actual amounts of potassium  may be different depending on ripeness, shelf life, and food preparation. Contact a dietitian for more information. What foods are low in potassium? Below are examples of foods that have less than 200 mg of potassium per serving. Fruits Blueberries -- 1 cup (145 g) has 110 mg of potassium. Apple -- 1 medium (140 g) has 145 mg of potassium. Grapes -- 1 cup (160 g) has 175 mg of potassium. Vegetables Cabbage, raw -- 1 cup (70 g) has 120 mg of potassium. Cauliflower, chopped, cooked -- 1 cup (180 g) has 90 mg of potassium. Romaine lettuce, chopped -- 1 cup (56 g) has 120 mg of potassium. Grains Bagel, plain -- one 4-inch (10 cm) has 100 mg of potassium. Whole wheat bread -- 1 slice (26 g) has 70 mg of potassium. White rice, cooked -- 1 cup (163 g) has 50 mg of potassium. Meats and other proteins Tuna, light, canned in water -- 3 ounces (85 g) has 150 mg of potassium. Egg, fried -- 1 large (50 g) has 60 mg of potassium. Peanuts --1 ounce (35 nuts or 28 g) has 180 mg of potassium. Tofu --  cup (252 g) has 150 mg of potassium. Dairy Cheese (cheddar, colby, mozzarella, or provolone) -- 1 ounce (28 g) has 30 to 40 mg of potassium. The items listed above may not be a complete list of foods that are low in potassium. Actual amounts of   potassium may be different depending on ripeness, shelf life, and food preparation. Contact a dietitian for more information. Summary Potassium is a mineral found in many foods and drinks. It affects how the heart works, affects blood pressure, and keeps fluids and electrolytes balanced in the body. The amount of potassium you need each day depends on your age and any existing medical conditions you may have. Your health care provider or dietitian may recommend an amount of potassium that you should have each day. This information is not intended to replace advice given to you by your health care provider. Make sure you discuss any questions you have with your health  care provider. Document Revised: 04/06/2021 Document Reviewed: 03/18/2021 Elsevier Patient Education  2023 [employer]vier Inc.  

## 2022-11-16 DIAGNOSIS — J3081 Allergic rhinitis due to animal (cat) (dog) hair and dander: Secondary | ICD-10-CM | POA: Diagnosis not present

## 2022-11-16 DIAGNOSIS — I480 Paroxysmal atrial fibrillation: Secondary | ICD-10-CM | POA: Diagnosis not present

## 2022-11-16 NOTE — Progress Notes (Signed)
VIALS EXP 11-16-23

## 2022-11-17 ENCOUNTER — Ambulatory Visit (INDEPENDENT_AMBULATORY_CARE_PROVIDER_SITE_OTHER): Payer: Medicaid Other

## 2022-11-17 DIAGNOSIS — J309 Allergic rhinitis, unspecified: Secondary | ICD-10-CM | POA: Diagnosis not present

## 2022-11-17 DIAGNOSIS — I480 Paroxysmal atrial fibrillation: Secondary | ICD-10-CM | POA: Diagnosis not present

## 2022-11-18 DIAGNOSIS — J3089 Other allergic rhinitis: Secondary | ICD-10-CM | POA: Diagnosis not present

## 2022-11-18 DIAGNOSIS — I480 Paroxysmal atrial fibrillation: Secondary | ICD-10-CM | POA: Diagnosis not present

## 2022-11-19 DIAGNOSIS — I480 Paroxysmal atrial fibrillation: Secondary | ICD-10-CM | POA: Diagnosis not present

## 2022-11-20 DIAGNOSIS — I480 Paroxysmal atrial fibrillation: Secondary | ICD-10-CM | POA: Diagnosis not present

## 2022-11-21 DIAGNOSIS — I480 Paroxysmal atrial fibrillation: Secondary | ICD-10-CM | POA: Diagnosis not present

## 2022-11-22 ENCOUNTER — Ambulatory Visit: Payer: Medicaid Other | Attending: Neurosurgery

## 2022-11-22 ENCOUNTER — Encounter: Payer: Self-pay | Admitting: Orthopaedic Surgery

## 2022-11-22 ENCOUNTER — Ambulatory Visit (INDEPENDENT_AMBULATORY_CARE_PROVIDER_SITE_OTHER): Payer: Medicaid Other | Admitting: Orthopaedic Surgery

## 2022-11-22 ENCOUNTER — Telehealth: Payer: Self-pay | Admitting: Physical Medicine and Rehabilitation

## 2022-11-22 VITALS — BP 148/89 | Ht 73.0 in | Wt 254.0 lb

## 2022-11-22 DIAGNOSIS — M542 Cervicalgia: Secondary | ICD-10-CM | POA: Diagnosis not present

## 2022-11-22 DIAGNOSIS — R293 Abnormal posture: Secondary | ICD-10-CM

## 2022-11-22 DIAGNOSIS — M75122 Complete rotator cuff tear or rupture of left shoulder, not specified as traumatic: Secondary | ICD-10-CM | POA: Diagnosis not present

## 2022-11-22 DIAGNOSIS — M6281 Muscle weakness (generalized): Secondary | ICD-10-CM | POA: Diagnosis not present

## 2022-11-22 DIAGNOSIS — I480 Paroxysmal atrial fibrillation: Secondary | ICD-10-CM | POA: Diagnosis not present

## 2022-11-22 NOTE — Therapy (Signed)
OUTPATIENT PHYSICAL THERAPY TREATMENT NOTE   Patient Name: Jason Stokes MRN: [id number] DOB:1961/05/10, 62 y.o., male Today's Date: 11/22/2022  PCP: Hoy Register, MD   REFERRING PROVIDER: Coletta Memos, MD; Annell Greening, MD  END OF SESSION:   PT End of Session - 11/22/22 0944     Visit Number 16    Number of Visits 21    Date for PT Re-Evaluation 12/02/22    Authorization Type MEDICAID [place] ACCESS    Authorization Time Period Submitted for 8 visits starting 11/07/22  [id number]   Approved 12 PT visits from 09/20/22-10/31/22    Authorization - Visit Number 12   pt was 13 mins late   Authorization - Number of Visits 21    PT Start Time 0944    PT Stop Time 1015    PT Time Calculation (min) 31 min    Activity Tolerance Patient limited by pain;Patient tolerated treatment well    Behavior During Therapy [facility] for tasks assessed/performed                 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)    a.) s/p embolization 08/19/2020: 29 mm RIGHT internal iliac artery aneurysm   Anxiety    Aortic atherosclerosis (HCC)    Bilateral carpal tunnel syndrome 01/10/2018   Bipolar disorder (HCC)    Chorioretinal inflammation of both eyes    a.) on azothioprine   Chronic lower back pain    Coronary artery calcification seen on CT scan    a.) cCTA 04/21/2022: Ca score 20.6 (61st percentile for age/sex match control)   DDD (degenerative disc disease), cervical    Depression    Diastolic dysfunction    a.) TTE 01/13/2021: EF 60-65%, mod LVH, triv MR, G1DD   GERD (gastroesophageal reflux disease)    Hepatic steatosis    History of alcohol abuse    History of nuclear stress test    Myoview 10/16: EF 50%, diaphragmatic attenuation, no ischemia, low risk   Hypertension    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   a.) CT head 12/25/2012 --> RIGHT basal ganglia hypoattenuation related to remote lacunar infarct   Lipoma    Long  term (current) use of anticoagulants    a.) apixaban   Long-term current use of immunomodulator    a.) on azothioprine for peripheral focal chorioretinal inflammation (both eyes)   Marijuana use    Migraine    Moderate persistent asthma with acute exacerbation 05/02/2018   PAF (paroxysmal atrial fibrillation) (HCC) 03/27/2015   a.) CHA2DS2VASc = 5 (HTN, CVA x2, vascular disease history, T2DM);  b.) s/p ablation 10/02/2015; c.) s/p ablation 12/10/2015; d.) s/p DCCV (200 J x 1) 12/11/2015; e.) s/p ablation 04/28/2022; f.) rate/rhythm maintained on oral diltiazem + carvedilol; chronically anticoagulated with apixaban   Pilonidal cyst    Schizophrenia (HCC)    Sciatica neuralgia    T2DM (type 2 diabetes mellitus) (HCC)    Thoracic aortic ectasia (HCC) 01/13/2021   a.) TTE 01/13/2021: Ao root 38 mm, asc Ao 39 mm   Past Surgical History:  Procedure Laterality Date   ATRIAL FIBRILLATION ABLATION  09/22/2015   ATRIAL FIBRILLATION ABLATION N/A 04/28/2022   Procedure: ATRIAL FIBRILLATION ABLATION;  Surgeon: Regan Lemming, MD;  Location: [facility];  Service: Cardiovascular;  Laterality: N/A;   CATARACT EXTRACTION Bilateral    CYST EXCISION  1996-97   surgery back of head  ELECTROPHYSIOLOGIC STUDY N/A 09/22/2015   Procedure: Atrial Fibrillation Ablation;  Surgeon: Will Jorja Loa, MD;  Location: [facility];  Service: Cardiovascular;  Laterality: N/A;   ELECTROPHYSIOLOGIC STUDY N/A 12/10/2015   Procedure: Atrial Fibrillation Ablation;  Surgeon: Will Jorja Loa, MD;  Location: [facility];  Service: Cardiovascular;  Laterality: N/A;   ELECTROPHYSIOLOGIC STUDY N/A 12/11/2015   Procedure: Cardioversion;  Surgeon: Will Jorja Loa, MD;  Location: [facility];  Service: Cardiovascular;  Laterality: N/A;   EMBOLIZATION Right 08/19/2020   Procedure: EMBOLIZATION;  Surgeon: Leonie Douglas, MD;  Location: [facility];  Service: Cardiovascular;   Laterality: Right;  hypogastric   EXCISION MASS HEAD N/A 01/06/2017   Procedure: EXCISION MASS FOREHEAD;  Surgeon: Glenna Fellows, MD;  Location: [facility];  Service: Plastics;  Laterality: N/A;   EXCISION MASS UPPER EXTREMETIES Right 08/08/2022   Procedure: EXCISION MASS RIGHT FOREARM;  Surgeon: Betha Loa, MD;  Location: [facility];  Service: Orthopedics;  Laterality: Right;  45 MIN   GANGLION CYST EXCISION Left    INTERCOSTAL NERVE BLOCK  07/19/2003   KNEE ARTHROSCOPY Right 07/18/2014   MASS EXCISION N/A 01/25/2021   Procedure: EXCISION SUBCUTANEOUS VS SUBFASCIAL MASS TORSO 3CM;  Surgeon: Glenna Fellows, MD;  Location: [facility];  Service: Plastics;  Laterality: N/A;   PILONIDAL CYST EXCISION N/A 09/13/2022   Procedure: CYST EXCISION PILONIDAL EXTENSIVE;  Surgeon: Henrene Dodge, MD;  Location: [facility];  Service: General;  Laterality: N/A;   Patient Active Problem List   Diagnosis Date Noted   Complete tear of left rotator cuff 10/28/2022   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   Pilonidal cyst 09/13/2022   Aortic atherosclerosis (HCC) 06/07/2022   Hypercoagulable state due to paroxysmal atrial fibrillation (HCC) 03/23/2021   Type 2 diabetes mellitus with hyperglycemia, without long-term current use of insulin (HCC) 02/10/2021   Neck pain on left side 03/06/2019   Musculoskeletal chest pain 07/24/2018   Asthma, mild intermittent 05/02/2018   Allergic rhinitis caused by mold 05/02/2018   Tobacco use 05/02/2018   Bilateral carpal tunnel syndrome 01/10/2018   Stroke (HCC)    Schizophrenia (HCC)    Migraine    History of nuclear stress test    History of alcohol abuse    GERD (gastroesophageal reflux disease)    Dysrhythmia    Chronic lower back pain    Anxiety    Arthritis of knee, right 04/12/2017   Bipolar disorder (HCC) 04/03/2017   Lipoma of forehead 09/22/2016   Trigger ring finger of right hand 06/22/2016    Paroxysmal atrial fibrillation (HCC)    AF (atrial fibrillation) (HCC) 12/10/2015   Eczema 07/10/2015   History of CVA (cerebrovascular accident) 05/04/2015   Tear of medial meniscus of right knee 03/18/2015   Chronic pain of right knee 03/12/2015   Other and unspecified hyperlipidemia 11/25/2013   Nasal congestion 11/25/2013   Sinusitis, chronic 05/09/2013   Chronic low back pain 10/12/2012   Lumbar radiculopathy 10/12/2012   Hypertension 10/12/2012   Depression 10/12/2012   Insomnia 10/12/2012    REFERRING DIAG: M47.22 (ICD-10-CM) - Other spondylosis with radiculopathy, cervical region; M75.122 (ICD-10-CM) - Complete tear of left rotator cuff, unspecified whether traumatic; M75.02 (ICD-10-CM) - Adhesive capsulitis of left shoulder   THERAPY DIAG:  Cervicalgia  Abnormal posture  Muscle weakness (generalized)  Rationale for Evaluation and Treatment rehabilitation  PERTINENT HISTORY:  Migraines, bipolar disorder, hx of bilat carpal tunnel,  PAF  PRECAUTIONS: Left shoulder rotator cuff tear and biceps tendinosis with adhesive capsulitis. Passive ROM please.    WEIGHT BEARING RESTRICTIONS: No  SUBJECTIVE:                                                                                                                                                                                      SUBJECTIVE STATEMENT:  Pt reports his R neck and shoulder pain continues to be high with onlytemporary relief   Are you having pain? Yes: NPRS scale: R 10/10, L 4/10  Pain location: neck pain R>L Pain description: Throb, ache Aggravating factors: Turning my head  Relieving factors: Not sure, heat and cold packs don't seem to help   OBJECTIVE: (objective measures completed at initial evaluation unless otherwise dated)   DIAGNOSTIC FINDINGS:                        CT scan 07/27/22 IMPRESSION: 1. No acute fracture or traumatic listhesis of the cervical spine. 2. Degenerative disc disease  most pronounced at the C3-4 and C5-6 levels, without interval progression from recent previous CT and MRI.  MR Lt Shoulder 10/25/22 IMPRESSION: 1. Full-thickness tear of the mid to anterior supraspinatus tendon footprint measuring up to 14 mm in AP dimension. 2. Tiny, shallow articular sided tear of the distal critical zone of the mid to anterior infraspinatus tendon measuring only 3 mm in AP dimension. 3. Partial-thickness tear of the midsubstance fibers of the superior subscapularis tendon in a region measuring up to 9 mm in craniocaudal dimension. 4. Mild-to-moderate long head of the biceps tendinosis proximal to the bicipital groove. The superior subscapularis tendon tear allows the long head of the biceps tendon to be partially perched on the anterior superior aspect of the lesser tuberosity as the biceps tendon enters the bicipital groove. 5. Moderate to severe degenerative changes of the acromioclavicular joint. Type III acromion.   PATIENT SURVEYS:  NDI=41, 82% complete disability   COGNITION: Overall cognitive status: Within functional limits for tasks assessed   SENSATION: WFL   POSTURE: rounded shoulders and forward head   PALPATION: TTP of the cervical paraspinals and upper trap with increased muscle tension, L>R   CERVICAL ROM:    Active ROM A/PROM (deg) eval AROM 09/16/22 AROM 10/04/22 AROM 10/13/22 AROM 10/20/22  Flexion 35 pulling posterior bilat 32   40  Extension 40 pulling R lateral 40   40  Right lateral flexion 15 pulling bilat 15   18  Left lateral flexion 15 " needs to pop" 15   26  Right rotation 30 puling L lateral 30 30 40 30  Left rotation 40 pulling R lateral 38 42 50 40   (Blank rows = not tested)   UPPER EXTREMITY ROM: Passively able to lift arms to 110d bilat before report of pain as limitation Active ROM Right eval Left eval PROM LEFT 09/29/22 Right 10/11/22  Right 10/20/22  Shoulder flexion 90 90 145 140   Shoulder extension          Shoulder abduction     145 120 130  Shoulder adduction         Shoulder extension         Shoulder internal rotation     50    Shoulder external rotation     55    Elbow flexion         Elbow extension         Wrist flexion         Wrist extension         Wrist ulnar deviation         Wrist radial deviation         Wrist pronation         Wrist supination          (Blank rows = not tested)   UPPER EXTREMITY MMT: Grossly 4+ to 5/5 and equal bilat. Neck weakness noted per poor posture MMT Right eval Left eval  Shoulder flexion      Shoulder extension      Shoulder abduction      Shoulder adduction      Shoulder extension      Shoulder internal rotation      Shoulder external rotation      Middle trapezius      Lower trapezius      Elbow flexion      Elbow extension      Wrist flexion      Wrist extension      Wrist ulnar deviation      Wrist radial deviation      Wrist pronation      Wrist supination      Grip strength       (Blank rows = not tested)   CERVICAL SPECIAL TESTS:  Spurling's test positive; Distraction test positive   FUNCTIONAL TESTS:  NT  UPPER EXTREMITY EVALUATION: 11/02/22  UPPER EXTREMITY ROM:   Passive ROM Right eval Left eval LT 11/22/22  Shoulder flexion A 130, P155 P150 pain at end range P150 pain at end range  Shoulder extension     Shoulder abduction A125, P155 P135 pain at end range   Shoulder adduction     Shoulder internal rotation A L1, P50 P50 pain at end range   Shoulder external rotation A T4, P90 P70 pain at end reange   Elbow flexion     Elbow extension     Wrist flexion     Wrist extension     Wrist ulnar deviation     Wrist radial deviation     Wrist pronation     Wrist supination     (Blank rows = not tested)  TODAY'S TREATMENT:  [facility] Adult PT Treatment:                                                DATE: 11/22/22 Therapeutic Exercise: Standing Shoulder and Trunk Flexion at Table 5 reps - 10 hold Seated  Shoulder  External Rotation PROM on Table 5 reps - 10 hold Seated upper trap stretch x2 15" Seated levator stretch x2 15" Seated cervical retraction x2 15" Standing cervical retraction c towel  x10 5" Manual Therapy: STM to the R and L cervical paraspinals and upper trap c MTPR Manual cervical traction UPAs grade 3 mobs Modalities: Moist heat x 15 min  [facility] Adult PT Treatment:                                                DATE: 11/10/22 Therapeutic Exercise: Supine cervical retraction x10 3"  Supine cervical lift offs x5 5" Seated L Cervical Rotation AROM 2 reps - 15 hold Seated L Upper trap Stretch 2 reps - 15 hold Standing Shoulder and Trunk Flexion at Table 5 reps - 10 hold Seated Shoulder External Rotation PROM on Table 5 reps - 10 hold Manual Therapy: Skilled palapation to identify TrPs and taut muscle bands Trigger Point Dry Needling Treatment: Pre-treatment instruction: Patient instructed on dry needling rationale, procedures, and possible side effects including pain during treatment (achy,cramping feeling), bruising, drop of blood, lightheadedness, nausea, sweating. Patient Consent Given: Yes Education handout provided: Yes Muscles treated: R upper trap and cervical paraspinals and multifidi, C3-T1 Needle size and number: .30x46mm x 4 Electrical stimulation performed: Yes Parameters: mA 20 pps, intensity as tolerated Treatment response/outcome: Twitch response elicited Post-treatment instructions: Patient instructed to expect possible mild to moderate muscle soreness later today and/or tomorrow. Patient instructed in methods to reduce muscle soreness and to continue prescribed HEP. If patient was dry needled over the lung field, patient was instructed on signs and symptoms of pneumothorax and, however unlikely, to see immediate medical attention should they occur. Patient was also educated on signs and symptoms of infection and to seek medical attention should they occur. Patient  verbalized understanding of these instructions and education.   [facility] Adult PT Treatment:                                                DATE: 11/08/22 Manual Therapy: STM to the R cervical paraspinals and upper trap c MTPR Manual cervical traction UPAs grade 3 mobs Modalities: Moist heat to the neck and upper back x 15 mins  [facility] Adult PT Treatment:                                                DATE: 11/02/22 Therapeutic Exercise: Standing Shoulder and Trunk Flexion at Table 5 reps - 10 hold Seated Shoulder External Rotation PROM on Table 5 reps - 10 hold Updated HEP  [facility] Adult PT Treatment:                                                DATE: 10/20/22 Therapeutic Exercise: Seated upper trap stretch Seated levator stretch Seated cervical retraction Supine DNF Manual Therapy: Passive neck rotation, side bending, scalenes stretch, distraction Modalities: IFC with HMP x 15 min  [facility] Adult PT Treatment:                                                DATE: 10/13/22 Therapeutic Exercise: Supine cervical retraction x10 3"  Supine cervical lift offs x10 5" Seated Cervical Rotation AROM 3 reps - 15 hold Gentle Levator Scapulae Stretch 3 reps - 15 hold Manual Therapy: STM and DTM to the cervical paraspinals and upper trap Cervical manual traction Skilled palapation to identify TrPs and taut muscle bands Trigger Point Dry Needling Treatment: Pre-treatment instruction: Patient instructed on dry needling rationale, procedures, and possible side effects including pain during treatment (achy,cramping feeling), bruising, drop of blood, lightheadedness, nausea, sweating. Patient Consent Given: Yes Education handout provided: Yes Muscles treated: Bilat upper trap and cervical paraspinals and multifidi Needle size and number: .30x22mm x 4 Electrical stimulation performed: Yes Parameters: mA 20 pps, intensity as tolerated Treatment response/outcome: Twitch response elicited Post-treatment  instructions: Patient instructed to expect possible mild to moderate muscle soreness later today and/or tomorrow. Patient instructed in methods to reduce muscle soreness and to continue prescribed HEP. If patient was dry needled over the lung field, patient was instructed on signs and symptoms of pneumothorax and, however unlikely, to see immediate medical attention should they occur. Patient was also educated on signs and symptoms of infection and to seek medical attention should they occur. Patient verbalized understanding of these instructions and education.   PATIENT EDUCATION:  Education details: Eval findings, POC, HEP, self care  Person educated: Patient Education method: Explanation, Demonstration, Tactile cues, Verbal cues, and Handouts Education comprehension: verbalized understanding, returned demonstration, verbal cues required, and tactile cues required   HOME EXERCISE PROGRAM: Access Code: [email or web address] URL: [email or web address] Date: 11/02/2022 Prepared by: Joellyn Rued  Exercises - Seated Cervical Retraction  - 6 x daily - 7 x weekly - 1 sets - 3-5 reps - 3 hold - Supine Deep Neck Flexor Training - Hold  - 1 x daily - 7 x weekly - 1 sets - 10 reps - 5 hold - Seated Scapular Retraction  - 6 x daily - 7 x weekly - 1 sets - 3-5 reps - 3 hold - Seated Cervical Rotation AROM  - 6 x daily - 7 x weekly - 1 sets - 3 reps - 15 hold - Gentle Levator Scapulae Stretch  - 1 x daily - 7 x weekly - 1 sets - 3 reps - 15 hold - Standing Row with Anchored Resistance  - 1 x daily - 7 x weekly - 3 sets - 10 reps - Shoulder extension with resistance - Neutral  - 1 x daily - 7 x weekly - 3 sets - 10 reps - Standing Shoulder and Trunk Flexion at Table  - 2 x daily - 7 x weekly - 1 sets - 5 reps - 10 hold - Seated Shoulder External Rotation PROM on Table  - 1 x daily - 7 x weekly - 1 sets - 5 reps - 10 hold   ASSESSMENT:   CLINICAL IMPRESSION: PT was complete for manual therapy for STM c  MTPR and cervical ROM with UPA mobs and traction. Postural and posterior chain strengthening and flexibility of the R cervical and upper shoulder therex were then completed. Following the above treatment, pt reported a min decrease in R neck and upper shoulder pain. Additionally, ROM therex was completed  for the L shoulder. L shoulder ROM remains at a good level. Moist heat was then provided to further assist with pain reduction. Pt will continue to benefit from skilled PT to address impairments for improved function with less pain.    OBJECTIVE IMPAIRMENTS: decreased activity tolerance, decreased ROM, decreased strength, increased muscle spasms, impaired flexibility, impaired UE functional use, postural dysfunction, obesity, and pain.    ACTIVITY LIMITATIONS: carrying, lifting, bending, sitting, sleeping, dressing, reach over head, and caring for others   PARTICIPATION LIMITATIONS: meal prep, cleaning, laundry, and driving   PERSONAL FACTORS: Age, Fitness, Past/current experiences, Time since onset of injury/illness/exacerbation, and 1 comorbidity: high BMI  are also affecting patient's functional outcome.    REHAB POTENTIAL: Good   CLINICAL DECISION MAKING: Evolving/moderate complexity   EVALUATION COMPLEXITY: Moderate     GOALS:     SHORT TERM GOALS: Target date: 09/16/22   Pt will be Ind in an initial HEP Baseline: initial Goal status: MET 09/16/22   2.  Pt will voice understanding of measures to assist in pain reduction Baseline: Initial  Goal status: MET 09/16/22, use of heat and cold packs, TENs unit   LONG TERM GOALS: Target date: 12/02/22   Pt will be Ind in a final HEP to maintain achieved LOF  Baseline: initiated Goal status: Ongoing   2.  Pt will be able to demonstrate proper sitting posture Baseline: Poor posture 10/04/22: poor posture 10/20/22: continues to require cues  Goal status: Ongoing   3.  Increase cervical side bending and and rotation for improved neck  function, especially with driving Baseline: see flow sheets 10/11/22: reports improvement in ability to turn head when driving  10/25/79; see flow sheets Goal status: Improved   4.  Pt will demonstrate DNF endurance of 20" with a single attempt Baseline: NT 09/22/22: 25 sec Goal status: MET   5.  Pt will report neck pain of 4/10 or less for improved neck function and QOL Baseline: 8/10 Status: R 5/10; L 9/10 Goal status: Improved R  6. Increase Pt's L shoulder PROM for abd by 15d and ER by 10d for improved ROM in preparation for possible RC surgery  Baseline:PROM Abd 135d, ER 70  Goal Status: Initial (11/02/22)  PLAN:   PT FREQUENCY: 2x/week   PT DURATION: 4 weeks   PLANNED INTERVENTIONS: Therapeutic exercises, Therapeutic activity, Patient/Family education, Self Care, Dry Needling, Electrical stimulation, Spinal manipulation, Spinal mobilization, Cryotherapy, Moist heat, Taping, Traction, Ultrasound, Ionotophoresis 4mg /ml Dexamethasone, Manual therapy, and Re-evaluation   PLAN FOR NEXT SESSION: MRI results?  Review NDI; assess response to HEP; progress therex as indicated; use of modalities, manual therapy; and TPDN as indicated    [employer] MS, PT 11/22/22 1:45 PM

## 2022-11-22 NOTE — Progress Notes (Unsigned)
Office Visit Note   [name]           Date of Birth: [birth year]           MRN: [id number] Visit Date: 11/22/2022              Requested by: Hoy Register, MD [address] [place],  [place] [address] PCP: Hoy Register, MD   Assessment & Plan: Visit Diagnoses: No diagnosis found.  Plan: Patient has good passive range of motion with improvement in his adhesive capsulitis with physical therapy.  He is ready proceed with outpatient shoulder arthroscopy and rotator cuff repair and biceps tenodesis if needed.  We discussed outpatient surgery with preoperative block.  We discussed postoperative sling and therapy exercises.  Questions were elicited and answered he understands request to proceed he is needed cardiology preoperative clearance with his history of atrial fibrillation.  Follow-Up Instructions: No follow-ups on file.   Orders:  No orders of the defined types were placed in this encounter.  No orders of the defined types were placed in this encounter.     Procedures: No procedures performed   Clinical Data: No additional findings.   Subjective: Chief Complaint  Patient presents with   Left Shoulder - Pain, Follow-up    HPI 62 year old male returns with ongoing problems with left shoulder pain and weakness.  MRI scan shows complete rotator cuff tear measuring 14 mm in AP diameter.  Some partial tearing of the subscap and biceps tendon has tendinopathy perched on the anterior aspect of the lesser tuberosity.  Additionally some severe degenerative acromioclavicular changes which have not been symptomatic.  He is worked with physical therapy and has improved his passive range of motion.  With assistance he can get his arm up over his head now.  He been followed by Dr. Franky Macho for some cervical foraminal stenosis but is having much more symptoms with his shoulder.  Additional problems include chronic back pain depression history of alcohol  abuse history of lacunar infarct.  Right internal iliac aneurysm bilateral carpal tunnel releases type 2 diabetes and atrial fibrillation in the past.  Review of Systems all other systems noncontributory to HPI.   Objective: Vital Signs: BP (!) 148/89   Ht 6\' 1"  (1.854 m)   Wt 254 lb (115.2 kg)   BMI 33.51 kg/m   Physical Exam Constitutional:      Appearance: He is well-developed.  HENT:     Head: Normocephalic and atraumatic.     Right Ear: External ear normal.     Left Ear: External ear normal.  Eyes:     Pupils: Pupils are equal, round, and reactive to light.  Neck:     Thyroid: No thyromegaly.     Trachea: No tracheal deviation.  Cardiovascular:     Rate and Rhythm: Normal rate.  Pulmonary:     Effort: Pulmonary effort is normal.     Breath sounds: No wheezing.  Abdominal:     General: Bowel sounds are normal.     Palpations: Abdomen is soft.  Musculoskeletal:     Cervical back: Neck supple.  Skin:    General: Skin is warm and dry.     Capillary Refill: Capillary refill takes less than 2 seconds.  Neurological:     Mental Status: He is alert and oriented to person, place, and time.  Psychiatric:        Behavior: Behavior normal.        Thought  Content: Thought content normal.        Judgment: Judgment normal.     Ortho Exam patient get his arm up over his head right shoulder easily.  Left shoulder now abduction to 140 degrees passively.  Positive drop arm test positive Neer test.  Significant tenderness anteriorly over the biceps tendon.  Elbow range of motion is full sensation and is intact.  Mild brachial plexus tenderness on the left and on the right.  Specialty Comments:  MRI CERVICAL SPINE WITHOUT CONTRAST   TECHNIQUE: Multiplanar, multisequence MR imaging of the cervical spine was performed. No intravenous contrast was administered.   COMPARISON:  Cervical spine CT 12/06/2020   FINDINGS: Alignment: Unremarkable   Vertebrae: No fracture, evidence  of discitis, or bone lesion.   Cord: Normal signal and morphology.   Posterior Fossa, vertebral arteries, paraspinal tissues: Suboccipital scarring. No perispinal mass or inflammation.   Disc levels:   C2-3: Asymmetric left facet spurring.  No neural impingement   C3-4: Disc space narrowing and bulging with endplate and uncovertebral ridging. Left foraminal impingement. A disc osteophyte complex mildly indents the ventral cord   C4-5: Disc narrowing and bulging. Asymmetric left facet spurring. Moderate left and mild right foraminal narrowing   C5-6: Disc space narrowing and bulging with uncovertebral ridging. Mild facet spurring. Moderate bilateral foraminal narrowing   IMPRESSION: 1. 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 Moderate right foraminal narrowing at C5-6. 2. Diffusely patent spinal canal.     Electronically Signed   By: Tiburcio Pea M.D.   On: 05/18/2022 10:22  Imaging: Narrative & Impression CLINICAL DATA:  Left shoulder pain radiating down the arm.   EXAM: MRI OF THE LEFT SHOULDER WITHOUT CONTRAST   TECHNIQUE: Multiplanar, multisequence MR imaging of the shoulder was performed. No intravenous contrast was administered.   COMPARISON:  Left arm radiographs 04/27/2022   FINDINGS: Rotator cuff: There is a full-thickness tear of the mid to anterior supraspinatus tendon footprint measuring up to 14 mm in AP dimension (sagittal image 24) and with up to 10 mm tendon retraction (coronal image 13). There is trace fluid at the deep aspect of the proximal infraspinatus musculotendinous junction (sagittal series 6, image 13), a tiny interstitial tear. There is also a tiny articular sided tear of the distal critical zone of the mid to anterior infraspinatus, measuring only 3 mm in AP dimension, 2 mm along the longitudinal dimension of the tendon, and 3 mm in craniocaudal thickness (sagittal series 6, image 20 and coronal  series 4, image 6). There is a partial-thickness tear of the midsubstance fibers of the superior subscapularis tendon in a region measuring up to 9 mm in craniocaudal dimension. The teres minor is intact.   Muscles:  Mild to moderate anterior supraspinatus muscle atrophy.   Biceps long head: Mild to moderate intermediate T2 signal and thickening tendinosis of the long head of the biceps tendon proximal to the bicipital groove. The superior subscapularis tendon tear allows the long head of the biceps tendon to be partially perched on the anterior superior aspect of the lesser tuberosity as the biceps tendon enters the bicipital groove (axial series 8, image 12).   Acromioclavicular Joint: There are moderate to severe degenerative changes of the acromioclavicular joint including joint space narrowing, subchondral marrow edema, and peripheral osteophytosis. Type III acromion, with mild downsloping of the anterolateral acromion. Mild-to-moderate fluid within the subacromial/subdeltoid bursa.   Glenohumeral Joint: Moderate glenohumeral cartilage thinning. Mild inferior glenoid  and humeral head-neck junction degenerative osteophytes.   Labrum: Mild degenerative irregularity of the posteroinferior glenoid labrum.   Bones:  No acute fracture.   Other: None.   IMPRESSION: 1. Full-thickness tear of the mid to anterior supraspinatus tendon footprint measuring up to 14 mm in AP dimension. 2. Tiny, shallow articular sided tear of the distal critical zone of the mid to anterior infraspinatus tendon measuring only 3 mm in AP dimension. 3. Partial-thickness tear of the midsubstance fibers of the superior subscapularis tendon in a region measuring up to 9 mm in craniocaudal dimension. 4. Mild-to-moderate long head of the biceps tendinosis proximal to the bicipital groove. The superior subscapularis tendon tear allows the long head of the biceps tendon to be partially perched on  the anterior superior aspect of the lesser tuberosity as the biceps tendon enters the bicipital groove. 5. Moderate to severe degenerative changes of the acromioclavicular joint. Type III acromion.     Electronically Signed   By: Neita Garnet M.D.   On: 10/25/2022 13:26     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   Pilonidal cyst 09/13/2022   Aortic atherosclerosis (HCC) 06/07/2022   Hypercoagulable state due to paroxysmal atrial fibrillation (HCC) 03/23/2021   Type 2 diabetes mellitus with hyperglycemia, without long-term current use of insulin (HCC) 02/10/2021   Neck pain on left side 03/06/2019   Musculoskeletal chest pain 07/24/2018   Asthma, mild intermittent 05/02/2018   Allergic rhinitis caused by mold 05/02/2018   Tobacco use 05/02/2018   Bilateral carpal tunnel syndrome 01/10/2018   Stroke (HCC)    Schizophrenia (HCC)    Migraine    History of nuclear stress test    History of alcohol abuse    GERD (gastroesophageal reflux disease)    Dysrhythmia    Chronic lower back pain    Anxiety    Arthritis of knee, right 04/12/2017   Bipolar disorder (HCC) 04/03/2017   Lipoma of forehead 09/22/2016   Trigger ring finger of right hand 06/22/2016   Paroxysmal atrial fibrillation (HCC)    AF (atrial fibrillation) (HCC) 12/10/2015   Eczema 07/10/2015   History of CVA (cerebrovascular accident) 05/04/2015   Tear of medial meniscus of right knee 03/18/2015   Chronic pain of right knee 03/12/2015   Other and unspecified hyperlipidemia 11/25/2013   Nasal congestion 11/25/2013   Sinusitis, chronic 05/09/2013   Chronic low back pain 10/12/2012   Lumbar radiculopathy 10/12/2012   Hypertension 10/12/2012   Depression 10/12/2012   Insomnia 10/12/2012   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)     a.) s/p embolization 08/19/2020: 29 mm RIGHT internal iliac artery aneurysm   Anxiety    Aortic atherosclerosis (HCC)    Bilateral carpal tunnel syndrome 01/10/2018   Bipolar disorder (HCC)    Chorioretinal inflammation of both eyes    a.) on azothioprine   Chronic lower back pain    Coronary artery calcification seen on CT scan    a.) cCTA 04/21/2022: Ca score 20.6 (61st percentile for age/sex match control)   DDD (degenerative disc disease), cervical    Depression    Diastolic dysfunction    a.) TTE 01/13/2021: EF 60-65%, mod LVH, triv MR, G1DD   GERD (gastroesophageal reflux disease)    Hepatic steatosis    History of alcohol  abuse    History of nuclear stress test    Myoview 10/16: EF 50%, diaphragmatic attenuation, no ischemia, low risk   Hypertension    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   a.) CT head 12/25/2012 --> RIGHT basal ganglia hypoattenuation related to remote lacunar infarct   Lipoma    Long term (current) use of anticoagulants    a.) apixaban   Long-term current use of immunomodulator    a.) on azothioprine for peripheral focal chorioretinal inflammation (both eyes)   Marijuana use    Migraine    Moderate persistent asthma with acute exacerbation 05/02/2018   PAF (paroxysmal atrial fibrillation) (HCC) 03/27/2015   a.) CHA2DS2VASc = 5 (HTN, CVA x2, vascular disease history, T2DM);  b.) s/p ablation 10/02/2015; c.) s/p ablation 12/10/2015; d.) s/p DCCV (200 J x 1) 12/11/2015; e.) s/p ablation 04/28/2022; f.) rate/rhythm maintained on oral diltiazem + carvedilol; chronically anticoagulated with apixaban   Pilonidal cyst    Schizophrenia (HCC)    Sciatica neuralgia    T2DM (type 2 diabetes mellitus) (HCC)    Thoracic aortic ectasia (HCC) 01/13/2021   a.) TTE 01/13/2021: Ao root 38 mm, asc Ao 39 mm    Family History  Problem Relation Age of Onset   Heart disease Father    Schizophrenia Sister     Past Surgical History:  Procedure Laterality Date   ATRIAL  FIBRILLATION ABLATION  09/22/2015   ATRIAL FIBRILLATION ABLATION N/A 04/28/2022   Procedure: ATRIAL FIBRILLATION ABLATION;  Surgeon: Regan Lemming, MD;  Location: [facility];  Service: Cardiovascular;  Laterality: N/A;   CATARACT EXTRACTION Bilateral    CYST EXCISION  1996-97   surgery back of head    ELECTROPHYSIOLOGIC STUDY N/A 09/22/2015   Procedure: Atrial Fibrillation Ablation;  Surgeon: Will Jorja Loa, MD;  Location: [facility];  Service: Cardiovascular;  Laterality: N/A;   ELECTROPHYSIOLOGIC STUDY N/A 12/10/2015   Procedure: Atrial Fibrillation Ablation;  Surgeon: Will Jorja Loa, MD;  Location: [facility];  Service: Cardiovascular;  Laterality: N/A;   ELECTROPHYSIOLOGIC STUDY N/A 12/11/2015   Procedure: Cardioversion;  Surgeon: Will Jorja Loa, MD;  Location: [facility];  Service: Cardiovascular;  Laterality: N/A;   EMBOLIZATION Right 08/19/2020   Procedure: EMBOLIZATION;  Surgeon: Leonie Douglas, MD;  Location: [facility];  Service: Cardiovascular;  Laterality: Right;  hypogastric   EXCISION MASS HEAD N/A 01/06/2017   Procedure: EXCISION MASS FOREHEAD;  Surgeon: Glenna Fellows, MD;  Location: [facility];  Service: Plastics;  Laterality: N/A;   EXCISION MASS UPPER EXTREMETIES Right 08/08/2022   Procedure: EXCISION MASS RIGHT FOREARM;  Surgeon: Betha Loa, MD;  Location: [facility];  Service: Orthopedics;  Laterality: Right;  45 MIN   GANGLION CYST EXCISION Left    INTERCOSTAL NERVE BLOCK  07/19/2003   KNEE ARTHROSCOPY Right 07/18/2014   MASS EXCISION N/A 01/25/2021   Procedure: EXCISION SUBCUTANEOUS VS SUBFASCIAL MASS TORSO 3CM;  Surgeon: Glenna Fellows, MD;  Location: [facility];  Service: Plastics;  Laterality: N/A;   PILONIDAL CYST EXCISION N/A 09/13/2022   Procedure: CYST EXCISION PILONIDAL EXTENSIVE;  Surgeon: Henrene Dodge, MD;  Location: [facility];  Service: General;   Laterality: N/A;   Social History   Occupational History    Comment: disabled  Tobacco Use   Smoking status: Former    Years: 29    Types: Cigarettes    Quit date: 09/10/2022    Years since quitting: 0.2  Passive exposure: Never   Smokeless tobacco: Never   Tobacco comments:    1 cigarettes every day  06-02-2021  Vaping Use   Vaping Use: Never used  Substance and Sexual Activity   Alcohol use: Yes    Alcohol/week: 6.0 standard drinks of alcohol    Types: 6 Cans of beer per week    Comment: 6 pack weekly 05/26/22   Drug use: Yes    Types: Marijuana    Comment: sunday was last time   Sexual activity: Not Currently

## 2022-11-22 NOTE — Telephone Encounter (Signed)
Patient would like an appointment with Dr. Newton.  

## 2022-11-23 ENCOUNTER — Ambulatory Visit (INDEPENDENT_AMBULATORY_CARE_PROVIDER_SITE_OTHER): Payer: Medicaid Other

## 2022-11-23 DIAGNOSIS — J309 Allergic rhinitis, unspecified: Secondary | ICD-10-CM | POA: Diagnosis not present

## 2022-11-23 DIAGNOSIS — I480 Paroxysmal atrial fibrillation: Secondary | ICD-10-CM | POA: Diagnosis not present

## 2022-11-23 NOTE — Therapy (Signed)
OUTPATIENT PHYSICAL THERAPY TREATMENT NOTE   Patient Name: Jason Stokes MRN: [id number] DOB:04-07-1961, 62 y.o., male Today's Date: 11/24/2022  PCP: Hoy Register, MD   REFERRING PROVIDER: Coletta Memos, MD; Annell Greening, MD  END OF SESSION:   PT End of Session - 11/24/22 0940     Visit Number 17    Number of Visits 21    Date for PT Re-Evaluation 12/02/22    Authorization Type MEDICAID [place] ACCESS    Authorization Time Period Submitted for 8 visits starting 11/07/22  [id number]   Approved 12 PT visits from 09/20/22-10/31/22    Authorization - Visit Number 13    Authorization - Number of Visits 21    Progress Note Due on Visit 20    PT Start Time 0940    PT Stop Time 1025    PT Time Calculation (min) 45 min    Activity Tolerance Patient limited by pain;Patient tolerated treatment well    Behavior During Therapy [facility] for tasks assessed/performed                 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)    a.) s/p embolization 08/19/2020: 29 mm RIGHT internal iliac artery aneurysm   Anxiety    Aortic atherosclerosis (HCC)    Bilateral carpal tunnel syndrome 01/10/2018   Bipolar disorder (HCC)    Chorioretinal inflammation of both eyes    a.) on azothioprine   Chronic lower back pain    Coronary artery calcification seen on CT scan    a.) cCTA 04/21/2022: Ca score 20.6 (61st percentile for age/sex match control)   DDD (degenerative disc disease), cervical    Depression    Diastolic dysfunction    a.) TTE 01/13/2021: EF 60-65%, mod LVH, triv MR, G1DD   GERD (gastroesophageal reflux disease)    Hepatic steatosis    History of alcohol abuse    History of nuclear stress test    Myoview 10/16: EF 50%, diaphragmatic attenuation, no ischemia, low risk   Hypertension    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   a.) CT head 12/25/2012 --> RIGHT basal ganglia hypoattenuation related to remote lacunar infarct    Lipoma    Long term (current) use of anticoagulants    a.) apixaban   Long-term current use of immunomodulator    a.) on azothioprine for peripheral focal chorioretinal inflammation (both eyes)   Marijuana use    Migraine    Moderate persistent asthma with acute exacerbation 05/02/2018   PAF (paroxysmal atrial fibrillation) (HCC) 03/27/2015   a.) CHA2DS2VASc = 5 (HTN, CVA x2, vascular disease history, T2DM);  b.) s/p ablation 10/02/2015; c.) s/p ablation 12/10/2015; d.) s/p DCCV (200 J x 1) 12/11/2015; e.) s/p ablation 04/28/2022; f.) rate/rhythm maintained on oral diltiazem + carvedilol; chronically anticoagulated with apixaban   Pilonidal cyst    Schizophrenia (HCC)    Sciatica neuralgia    T2DM (type 2 diabetes mellitus) (HCC)    Thoracic aortic ectasia (HCC) 01/13/2021   a.) TTE 01/13/2021: Ao root 38 mm, asc Ao 39 mm   Past Surgical History:  Procedure Laterality Date   ATRIAL FIBRILLATION ABLATION  09/22/2015   ATRIAL FIBRILLATION ABLATION N/A 04/28/2022   Procedure: ATRIAL FIBRILLATION ABLATION;  Surgeon: Regan Lemming, MD;  Location: [facility];  Service: Cardiovascular;  Laterality: N/A;   CATARACT EXTRACTION Bilateral    CYST EXCISION  1996-97   surgery back of  head    ELECTROPHYSIOLOGIC STUDY N/A 09/22/2015   Procedure: Atrial Fibrillation Ablation;  Surgeon: Will Jorja Loa, MD;  Location: [facility];  Service: Cardiovascular;  Laterality: N/A;   ELECTROPHYSIOLOGIC STUDY N/A 12/10/2015   Procedure: Atrial Fibrillation Ablation;  Surgeon: Will Jorja Loa, MD;  Location: [facility];  Service: Cardiovascular;  Laterality: N/A;   ELECTROPHYSIOLOGIC STUDY N/A 12/11/2015   Procedure: Cardioversion;  Surgeon: Will Jorja Loa, MD;  Location: [facility];  Service: Cardiovascular;  Laterality: N/A;   EMBOLIZATION Right 08/19/2020   Procedure: EMBOLIZATION;  Surgeon: Leonie Douglas, MD;  Location: [facility];  Service:  Cardiovascular;  Laterality: Right;  hypogastric   EXCISION MASS HEAD N/A 01/06/2017   Procedure: EXCISION MASS FOREHEAD;  Surgeon: Glenna Fellows, MD;  Location: [facility];  Service: Plastics;  Laterality: N/A;   EXCISION MASS UPPER EXTREMETIES Right 08/08/2022   Procedure: EXCISION MASS RIGHT FOREARM;  Surgeon: Betha Loa, MD;  Location: [facility];  Service: Orthopedics;  Laterality: Right;  45 MIN   GANGLION CYST EXCISION Left    INTERCOSTAL NERVE BLOCK  07/19/2003   KNEE ARTHROSCOPY Right 07/18/2014   MASS EXCISION N/A 01/25/2021   Procedure: EXCISION SUBCUTANEOUS VS SUBFASCIAL MASS TORSO 3CM;  Surgeon: Glenna Fellows, MD;  Location: [facility];  Service: Plastics;  Laterality: N/A;   PILONIDAL CYST EXCISION N/A 09/13/2022   Procedure: CYST EXCISION PILONIDAL EXTENSIVE;  Surgeon: Henrene Dodge, MD;  Location: [facility];  Service: General;  Laterality: N/A;   Patient Active Problem List   Diagnosis Date Noted   Complete tear of left rotator cuff 10/28/2022   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   Pilonidal cyst 09/13/2022   Aortic atherosclerosis (HCC) 06/07/2022   Hypercoagulable state due to paroxysmal atrial fibrillation (HCC) 03/23/2021   Type 2 diabetes mellitus with hyperglycemia, without long-term current use of insulin (HCC) 02/10/2021   Neck pain on left side 03/06/2019   Musculoskeletal chest pain 07/24/2018   Asthma, mild intermittent 05/02/2018   Allergic rhinitis caused by mold 05/02/2018   Tobacco use 05/02/2018   Bilateral carpal tunnel syndrome 01/10/2018   Stroke (HCC)    Schizophrenia (HCC)    Migraine    History of nuclear stress test    History of alcohol abuse    GERD (gastroesophageal reflux disease)    Dysrhythmia    Chronic lower back pain    Anxiety    Arthritis of knee, right 04/12/2017   Bipolar disorder (HCC) 04/03/2017   Lipoma of forehead 09/22/2016   Trigger ring finger of right  hand 06/22/2016   Paroxysmal atrial fibrillation (HCC)    AF (atrial fibrillation) (HCC) 12/10/2015   Eczema 07/10/2015   History of CVA (cerebrovascular accident) 05/04/2015   Tear of medial meniscus of right knee 03/18/2015   Chronic pain of right knee 03/12/2015   Other and unspecified hyperlipidemia 11/25/2013   Nasal congestion 11/25/2013   Sinusitis, chronic 05/09/2013   Chronic low back pain 10/12/2012   Lumbar radiculopathy 10/12/2012   Hypertension 10/12/2012   Depression 10/12/2012   Insomnia 10/12/2012    REFERRING DIAG: M47.22 (ICD-10-CM) - Other spondylosis with radiculopathy, cervical region; M75.122 (ICD-10-CM) - Complete tear of left rotator cuff, unspecified whether traumatic; M75.02 (ICD-10-CM) - Adhesive capsulitis of left shoulder   THERAPY DIAG:  Cervicalgia  Abnormal posture  Muscle weakness (generalized)  Rationale for Evaluation and Treatment rehabilitation  PERTINENT HISTORY:  Migraines, bipolar disorder, hx  of bilat carpal tunnel, PAF  PRECAUTIONS: Left shoulder rotator cuff tear and biceps tendinosis with adhesive capsulitis. Passive ROM please.    WEIGHT BEARING RESTRICTIONS: No  SUBJECTIVE:                                                                                                                                                                                      SUBJECTIVE STATEMENT:  Pt reports R lateral GH pain continues to an issue. Pt notes he is to have an injection to his R neck tomorrow and is to have L shoulder surgery in approx 2 weeks.   Are you having pain? Yes: NPRS scale: R 8/10, L 4/10  Pain location: neck pain R>L Pain description: Throb, ache Aggravating factors: Turning my head  Relieving factors: Not sure, heat and cold packs don't seem to help   OBJECTIVE: (objective measures completed at initial evaluation unless otherwise dated)   DIAGNOSTIC FINDINGS:                        CT scan 07/27/22 IMPRESSION: 1.  No acute fracture or traumatic listhesis of the cervical spine. 2. 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  MR Lt Shoulder 10/25/22 IMPRESSION: 1. Full-thickness tear of the mid to anterior supraspinatus tendon footprint measuring up to 14 mm in AP dimension. 2. Tiny, shallow articular sided tear of the distal critical zone of the mid to anterior infraspinatus tendon measuring only 3 mm in AP dimension. 3. Partial-thickness tear of the midsubstance fibers of the superior subscapularis tendon in a region measuring up to 9 mm in craniocaudal dimension. 4. Mild-to-moderate long head of the biceps tendinosis proximal to the bicipital groove. The superior subscapularis tendon tear allows the long head of the biceps tendon to be partially perched on the anterior superior aspect of the lesser tuberosity as the biceps tendon enters the bicipital groove. 5. Moderate to severe degenerative changes of the acromioclavicular joint. Type III acromion.   PATIENT SURVEYS:  NDI=41, 82% complete disability   COGNITION: Overall cognitive status: Within functional limits for tasks assessed   SENSATION: WFL   POSTURE: rounded shoulders and forward head   PALPATION: TTP of the cervical paraspinals and upper trap with increased muscle tension, L>R   CERVICAL ROM:    Active ROM A/PROM (deg) eval AROM 09/16/22 AROM 10/04/22 AROM 10/13/22 AROM 10/20/22  Flexion 35 pulling posterior bilat 32   40  Extension 40 pulling R lateral 40   40  Right lateral flexion 15 pulling bilat 15   18  Left  lateral flexion 15 " needs to pop" 15   26  Right rotation 30 puling L lateral 30 30 40 30  Left rotation 40 pulling R lateral 38 42 50 40   (Blank rows = not tested)   UPPER EXTREMITY ROM: Passively able to lift arms to 110d bilat before report of pain as limitation Active ROM Right eval Left eval PROM LEFT 09/29/22  Right 10/11/22  Right 10/20/22  Shoulder flexion 90 90 145 140   Shoulder extension         Shoulder abduction     145 120 130  Shoulder adduction         Shoulder extension         Shoulder internal rotation     50    Shoulder external rotation     55    Elbow flexion         Elbow extension         Wrist flexion         Wrist extension         Wrist ulnar deviation         Wrist radial deviation         Wrist pronation         Wrist supination          (Blank rows = not tested)   UPPER EXTREMITY MMT: Grossly 4+ to 5/5 and equal bilat. Neck weakness noted per poor posture MMT Right eval Left eval  Shoulder flexion      Shoulder extension      Shoulder abduction      Shoulder adduction      Shoulder extension      Shoulder internal rotation      Shoulder external rotation      Middle trapezius      Lower trapezius      Elbow flexion      Elbow extension      Wrist flexion      Wrist extension      Wrist ulnar deviation      Wrist radial deviation      Wrist pronation      Wrist supination      Grip strength       (Blank rows = not tested)   CERVICAL SPECIAL TESTS:  Spurling's test positive; Distraction test positive   FUNCTIONAL TESTS:  NT  UPPER EXTREMITY EVALUATION: 11/02/22  UPPER EXTREMITY ROM:   Passive ROM Right eval Left eval LT 11/22/22  Shoulder flexion A 130, P155 P150 pain at end range P150 pain at end range  Shoulder extension     Shoulder abduction A125, P155 P135 pain at end range   Shoulder adduction     Shoulder internal rotation A L1, P50 P50 pain at end range   Shoulder external rotation A T4, P90 P70 pain at end reange   Elbow flexion     Elbow extension     Wrist flexion     Wrist extension     Wrist ulnar deviation     Wrist radial deviation     Wrist pronation     Wrist supination     (Blank rows = not tested)  TODAY'S TREATMENT:  [facility] Adult PT Treatment:  DATE:  11/24/22 Therapeutic Exercise: Supine cervical retraction x10 5 " Supine neck lift offs x10 5" R shoulder ER 2x12 GTB R shoulder ext 2x12 GTB Standing Shoulder and Trunk Flexion at Table 5 reps - 10 hold Seated Shoulder External Rotation PROM on Table 5 reps - 10 hold Manual Therapy: Skilled palapation to identify TrPs and taut muscle bands Trigger Point Dry Needling Treatment: Pre-treatment instruction: Patient instructed on dry needling rationale, procedures, and possible side effects including pain during treatment (achy,cramping feeling), bruising, drop of blood, lightheadedness, nausea, sweating. Patient Consent Given: Yes Education handout provided: Yes Muscles treated: R upper trap and cervical paraspinal C4 Needle size and number: .30x72mm x 4 Electrical stimulation performed: Yes Parameters: mA 20 pps, intensity as tolerated Treatment response/outcome: Twitch response elicited Post-treatment instructions: Patient instructed to expect possible mild to moderate muscle soreness later today and/or tomorrow. Patient instructed in methods to reduce muscle soreness and to continue prescribed HEP. If patient was dry needled over the lung field, patient was instructed on signs and symptoms of pneumothorax and, however unlikely, to see immediate medical attention should they occur. Patient was also educated on signs and symptoms of infection and to seek medical attention should they occur. Patient verbalized understanding of these instructions and education.   [facility] Adult PT Treatment:                                                DATE: 11/22/22 Therapeutic Exercise: Standing Shoulder and Trunk Flexion at Table 5 reps - 10 hold Seated Shoulder External Rotation PROM on Table 5 reps - 10 hold Seated upper trap stretch x2 15" Seated levator stretch x2 15" Seated cervical retraction x2 15" Standing cervical retraction c towel  x10 5" Manual Therapy: STM to the R and L cervical paraspinals and  upper trap c MTPR Manual cervical traction UPAs grade 3 mobs Modalities: Moist heat x 15 min  [facility] Adult PT Treatment:                                                DATE: 11/10/22 Therapeutic Exercise: Supine cervical retraction x10 3"  Supine cervical lift offs x5 5" Seated L Cervical Rotation AROM 2 reps - 15 hold Seated L Upper trap Stretch 2 reps - 15 hold Standing Shoulder and Trunk Flexion at Table 5 reps - 10 hold Seated Shoulder External Rotation PROM on Table 5 reps - 10 hold Manual Therapy: Skilled palapation to identify TrPs and taut muscle bands Trigger Point Dry Needling Treatment: Pre-treatment instruction: Patient instructed on dry needling rationale, procedures, and possible side effects including pain during treatment (achy,cramping feeling), bruising, drop of blood, lightheadedness, nausea, sweating. Patient Consent Given: Yes Education handout provided: Yes Muscles treated: R upper trap and cervical paraspinals and multifidi, C3-T1 Needle size and number: .30x64mm x 4 Electrical stimulation performed: Yes Parameters: mA 20 pps, intensity as tolerated Treatment response/outcome: Twitch response elicited Post-treatment instructions: Patient instructed to expect possible mild to moderate muscle soreness later today and/or tomorrow. Patient instructed in methods to reduce muscle soreness and to continue prescribed HEP. If patient was dry needled over the lung field, patient was instructed on signs and symptoms of pneumothorax and, however unlikely, to see immediate  medical attention should they occur. Patient was also educated on signs and symptoms of infection and to seek medical attention should they occur. Patient verbalized understanding of these instructions and education.   [facility] Adult PT Treatment:                                                DATE: 11/08/22 Manual Therapy: STM to the R cervical paraspinals and upper trap c MTPR Manual cervical traction UPAs grade  3 mobs Modalities: Moist heat to the neck and upper back x 15 mins  PATIENT EDUCATION:  Education details: Eval findings, POC, HEP, self care  Person educated: Patient Education method: Explanation, Demonstration, Tactile cues, Verbal cues, and Handouts Education comprehension: verbalized understanding, returned demonstration, verbal cues required, and tactile cues required   HOME EXERCISE PROGRAM: Access Code: [email or web address] URL: [email or web address] Date: 11/02/2022 Prepared by: Joellyn Rued  Exercises - Seated Cervical Retraction  - 6 x daily - 7 x weekly - 1 sets - 3-5 reps - 3 hold - Supine Deep Neck Flexor Training - Hold  - 1 x daily - 7 x weekly - 1 sets - 10 reps - 5 hold - Seated Scapular Retraction  - 6 x daily - 7 x weekly - 1 sets - 3-5 reps - 3 hold - Seated Cervical Rotation AROM  - 6 x daily - 7 x weekly - 1 sets - 3 reps - 15 hold - Gentle Levator Scapulae Stretch  - 1 x daily - 7 x weekly - 1 sets - 3 reps - 15 hold - Standing Row with Anchored Resistance  - 1 x daily - 7 x weekly - 3 sets - 10 reps - Shoulder extension with resistance - Neutral  - 1 x daily - 7 x weekly - 3 sets - 10 reps - Standing Shoulder and Trunk Flexion at Table  - 2 x daily - 7 x weekly - 1 sets - 5 reps - 10 hold - Seated Shoulder External Rotation PROM on Table  - 1 x daily - 7 x weekly - 1 sets - 5 reps - 10 hold   ASSESSMENT:   CLINICAL IMPRESSION: PT was completed for posterior chain and postural strengthening f/b TPDN to the R lower cervical and upper trap mA estim. Pt tolerated PT today without adverse effects. Following the treatment session, pt reported a decrease in R cervical and upper shoulder pain from 8/10 to 4/10. To date R cervical/upper shoulder pain has been temporary. Will continue to address this pain as well as R shoulder mobility in preparation for RC surgery.  OBJECTIVE IMPAIRMENTS: decreased activity tolerance, decreased ROM, decreased strength, increased  muscle spasms, impaired flexibility, impaired UE functional use, postural dysfunction, obesity, and pain.    ACTIVITY LIMITATIONS: carrying, lifting, bending, sitting, sleeping, dressing, reach over head, and caring for others   PARTICIPATION LIMITATIONS: meal prep, cleaning, laundry, and driving   PERSONAL FACTORS: Age, Fitness, Past/current experiences, Time since onset of injury/illness/exacerbation, and 1 comorbidity: high BMI  are also affecting patient's functional outcome.    REHAB POTENTIAL: Good   CLINICAL DECISION MAKING: Evolving/moderate complexity   EVALUATION COMPLEXITY: Moderate     GOALS:     SHORT TERM GOALS: Target date: 09/16/22   Pt will be Ind in an initial HEP Baseline: initial Goal status: MET 09/16/22   2.  Pt will  voice understanding of measures to assist in pain reduction Baseline: Initial  Goal status: MET 09/16/22, use of heat and cold packs, TENs unit   LONG TERM GOALS: Target date: 12/02/22   Pt will be Ind in a final HEP to maintain achieved LOF  Baseline: initiated Goal status: Ongoing   2.  Pt will be able to demonstrate proper sitting posture Baseline: Poor posture 10/04/22: poor posture 10/20/22: continues to require cues  Goal status: Ongoing   3.  Increase cervical side bending and and rotation for improved neck function, especially with driving Baseline: see flow sheets 10/11/22: reports improvement in ability to turn head when driving  4/54/09; see flow sheets Goal status: Improved   4.  Pt will demonstrate DNF endurance of 20" with a single attempt Baseline: NT 09/22/22: 25 sec Goal status: MET   5.  Pt will report neck pain of 4/10 or less for improved neck function and QOL Baseline: 8/10 Status: R 5/10; L 9/10 Goal status: Improved R  6. Increase Pt's L shoulder PROM for abd by 15d and ER by 10d for improved ROM in preparation for possible RC surgery  Baseline:PROM Abd 135d, ER 70  Goal Status: Initial (11/02/22)  PLAN:   PT  FREQUENCY: 2x/week   PT DURATION: 4 weeks   PLANNED INTERVENTIONS: Therapeutic exercises, Therapeutic activity, Patient/Family education, Self Care, Dry Needling, Electrical stimulation, Spinal manipulation, Spinal mobilization, Cryotherapy, Moist heat, Taping, Traction, Ultrasound, Ionotophoresis 4mg /ml Dexamethasone, Manual therapy, and Re-evaluation   PLAN FOR NEXT SESSION: MRI results?  Review NDI; assess response to HEP; progress therex as indicated; use of modalities, manual therapy; and TPDN as indicated    [employer] MS, PT 11/24/22 5:53 PM

## 2022-11-23 NOTE — Telephone Encounter (Signed)
Spoke with patient and he is having pain on the right side now. Scheduled ov for 11/25/22

## 2022-11-24 ENCOUNTER — Ambulatory Visit: Payer: Medicaid Other

## 2022-11-24 DIAGNOSIS — I480 Paroxysmal atrial fibrillation: Secondary | ICD-10-CM | POA: Diagnosis not present

## 2022-11-24 DIAGNOSIS — M542 Cervicalgia: Secondary | ICD-10-CM | POA: Diagnosis not present

## 2022-11-24 DIAGNOSIS — M6281 Muscle weakness (generalized): Secondary | ICD-10-CM | POA: Diagnosis not present

## 2022-11-24 DIAGNOSIS — R293 Abnormal posture: Secondary | ICD-10-CM | POA: Diagnosis not present

## 2022-11-25 ENCOUNTER — Ambulatory Visit (INDEPENDENT_AMBULATORY_CARE_PROVIDER_SITE_OTHER): Payer: Medicaid Other | Admitting: Physical Medicine and Rehabilitation

## 2022-11-25 ENCOUNTER — Encounter: Payer: Self-pay | Admitting: Physical Medicine and Rehabilitation

## 2022-11-25 DIAGNOSIS — G894 Chronic pain syndrome: Secondary | ICD-10-CM | POA: Diagnosis not present

## 2022-11-25 DIAGNOSIS — I480 Paroxysmal atrial fibrillation: Secondary | ICD-10-CM | POA: Diagnosis not present

## 2022-11-25 DIAGNOSIS — M4802 Spinal stenosis, cervical region: Secondary | ICD-10-CM

## 2022-11-25 DIAGNOSIS — M5412 Radiculopathy, cervical region: Secondary | ICD-10-CM | POA: Diagnosis not present

## 2022-11-25 NOTE — Progress Notes (Unsigned)
Jason Stokes - 62 y.o. male MRN [id number]  Date of birth: 1960-11-08  Office Visit Note: Visit Date: 11/25/2022 PCP: Hoy Register, MD Referred by: Hoy Register, MD  Subjective: Chief Complaint  Patient presents with   Lower Back - Pain   HPI: Jason Stokes is a 62 y.o. male who comes in today for evaluation of chronic, worsening and severe right sided neck pain radiating to shoulder and down arm. Pain ongoing for several years, worsens with movement and activity. He describes as sore and throbbing, currently rates as 8 out of 10. Reports numbness/tingling to right arm/hand. Some relief of pain with home exercise regimen, rest and use of medications. History of chronic pain management, referral was placed by Dr. Dorene Grebe in January to [facility], however patient does not wish to continue with chronic pain management at this time. He feels cannabis significantly helps to alleviate his pain. He is currently undergoing formal physical therapy at [facility], he reports short term relief of pain with these treatments. Cervical MRI imaging from 2023 exhibits moderate right foraminal narrowing at C5-C6. No high grade spinal canal stenosis. CT of cervical spine from January exhibits multi level degenerative changes, more prominent at C3-C4 and C5-C6. Patient underwent left C7-T1 interlaminar epidural steroid in our office on 08/24/2022, he reports 90% relief of left sided symptoms with this procedure for over 3 months. Patient denies focal weakness. No recent trauma or falls.   Of note, patient also mentioned chronic lower back issues. He was previously managed by Dr. Coletta Memos and Dr. Darliss Cheney at [facility] for same issue. I am unable to review full notes in EPIC. Patient states he did undergo multiple lumbar epidural steroid injections with Dr. Murray Hodgkins, some relief of pain with these procedures. Also states he was  referred to Dr. Murray Hodgkins for possible spinal cord stimulator, however he was not interested in moving forward with this procedure. He would like to discuss lumbar injections with [place], however his neck issues are more severe at this time. I did notice his primary care provider recently referred him back to [facility] to discuss lumbar spine issues. He was recently seen by Dr. Annell Greening for left rotator cuff tear, states he is discussing possible surgery with Dr. Ophelia Charter, planning to have this done in the coming weeks.   Patients course is complicated by stroke, atrial fibrillation (currently taking Eliquis), type 2 diabetes, chronic pain syndrome, bipolar disorder, schizophrenia and history of alcohol abuse.    Review of Systems  Musculoskeletal:  Positive for back pain, myalgias and neck pain.  Neurological:  Positive for tingling. Negative for sensory change, focal weakness and weakness.  All other systems reviewed and are negative.  Otherwise per HPI.  Assessment & Plan: Visit Diagnoses:    ICD-10-CM   1. Cervical radiculopathy  M54.12 Ambulatory referral to Physical Medicine Rehab    2. Foraminal stenosis of cervical region  M48.02 Ambulatory referral to Physical Medicine Rehab    3. Chronic pain syndrome  G89.4 Ambulatory referral to Physical Medicine Rehab       Plan: Findings:  1. Chronic, worsening and severe right sided neck pain radiating to shoulder and down arm. Patient continues to have severe pain despite good conservative therapies such as formal physical therapy, home exercise regimen, rest and use of medications. Patients clinical presentation and exam are consistent with both C5 and C6 nerve pattern. Next step is to perform right C7-T1 interlaminar  epidural steroid injection under fluoroscopic guidance.  If good relief of pain with injection we can repeat this procedure infrequently as needed.  He is currently taking Eliquis, will contact Dr. Loman Brooklyn at [facility] for permission to  come off medication for injection.  He did voice concerns about anxiety surrounding procedure, I did prescribe pre-procedure Valium for him to take on day of injection.  Patient has no questions regarding injection procedure at this time, we will call him to schedule pending insurance approval.  No red flag symptoms noted upon exam today.  2. Chronic lower back pain. We did not formally evaluate lumbar spine today. Dr. Alvis Lemmings recently placed referral to [facility] for chronic lumbar issues. I will go ahead and have patient sign medical release form so that we can receive records pertaining to lumbar procedures from Dr. Franky Macho and Dr. Chari Manning office.  We are happy to revisit chronic lumbar issues at a later time, would consider repeating lumbar epidural steroid injections if warranted. We also discussed in detail long term pain management with opioids. I informed him we do not manage chronic pain long term with opioids in our office. If he wishes to continue with opioids long term we would recommend re-grouping with [facility] or another local pain management practice.     Meds & Orders: No orders of the defined types were placed in this encounter.   Orders Placed This Encounter  Procedures   Ambulatory referral to Physical Medicine Rehab    Follow-up: Return for Right C7-T1 interlaminar epidural steroid injection.   Procedures: No procedures performed      Clinical History: CLINICAL DATA:  Chronic neck pain   EXAM: CT CERVICAL SPINE WITHOUT CONTRAST   TECHNIQUE: Multidetector CT imaging of the cervical spine was performed without intravenous contrast. Multiplanar CT image reconstructions were also generated.   RADIATION DOSE REDUCTION: This exam was performed according to the departmental dose-optimization program which includes automated exposure control, adjustment of the mA and/or kV according to patient size and/or use of iterative reconstruction technique.   COMPARISON:   07/16/2022, 05/15/2022   FINDINGS: Alignment: Facet joints are aligned without dislocation or traumatic listhesis. Dens and lateral masses are aligned.   Skull base and vertebrae: No acute fracture. No primary bone lesion or focal pathologic process.   Soft tissues and spinal canal: No prevertebral fluid or swelling. No visible canal hematoma.   Disc levels: Degenerative disc disease most pronounced at the C3-4 and C5-6 levels, without interval progression from recent previous CT and MRI.   Upper chest: Negative.   Other: None.   IMPRESSION: 1. No acute fracture or traumatic listhesis of the cervical spine. 2. 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     Electronically Signed   By: Duanne Guess D.O.   On: 07/27/2022 14:03   He reports that he quit smoking about 2 months ago. His smoking use included cigarettes. He has never been exposed to tobacco smoke. He has never used smokeless tobacco.  Recent Labs    08/16/22 1504 11/15/22 1052  HGBA1C 8.1* 6.0    Objective:  VS:  HT:    WT:   BMI:     BP:   HR: bpm  TEMP: ( )  RESP:  Physical Exam Vitals and nursing note reviewed.  HENT:     Head: Normocephalic and atraumatic.     Right Ear: External ear normal.  Left Ear: External ear normal.     Nose: Nose normal.     Mouth/Throat:     Mouth: Mucous membranes are moist.  Eyes:     Extraocular Movements: Extraocular movements intact.  Cardiovascular:     Rate and Rhythm: Normal rate.     Pulses: Normal pulses.  Pulmonary:     Effort: Pulmonary effort is normal.  Abdominal:     General: Abdomen is flat. There is no distension.  Musculoskeletal:        General: Tenderness present.     Cervical back: Tenderness present.     Comments: No discomfort noted with flexion, extension and side-to-side rotation. Patient has good strength in the upper extremities including 5  out of 5 strength in wrist extension, long finger flexion and APB. Shoulder range of motion is full bilaterally without any sign of impingement. There is no atrophy of the hands intrinsically. Sensation intact bilaterally. Dysesthesias noted to right C5 and C6 dermatomes. Negative Hoffman's sign. Negative Spurling's sign.     Skin:    General: Skin is warm and dry.     Capillary Refill: Capillary refill takes less than 2 seconds.  Neurological:     General: No focal deficit present.     Mental Status: He is alert and oriented to person, place, and time.  Psychiatric:        Mood and Affect: Mood normal.        Behavior: Behavior normal.     Ortho Exam  Imaging: No results found.  Past Medical/Family/Surgical/Social History: Medications & Allergies reviewed per EMR, new medications updated. Patient Active Problem List   Diagnosis Date Noted   Complete tear of left rotator cuff 10/28/2022   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   Pilonidal cyst 09/13/2022   Aortic atherosclerosis (HCC) 06/07/2022   Hypercoagulable state due to paroxysmal atrial fibrillation (HCC) 03/23/2021   Type 2 diabetes mellitus with hyperglycemia, without long-term current use of insulin (HCC) 02/10/2021   Neck pain on left side 03/06/2019   Musculoskeletal chest pain 07/24/2018   Asthma, mild intermittent 05/02/2018   Allergic rhinitis caused by mold 05/02/2018   Tobacco use 05/02/2018   Bilateral carpal tunnel syndrome 01/10/2018   Stroke (HCC)    Schizophrenia (HCC)    Migraine    History of nuclear stress test    History of alcohol abuse    GERD (gastroesophageal reflux disease)    Dysrhythmia    Chronic lower back pain    Anxiety    Arthritis of knee, right 04/12/2017   Bipolar disorder (HCC) 04/03/2017   Lipoma of forehead 09/22/2016   Trigger ring finger of right hand 06/22/2016   Paroxysmal atrial fibrillation (HCC)    AF (atrial fibrillation) (HCC) 12/10/2015   Eczema 07/10/2015    History of CVA (cerebrovascular accident) 05/04/2015   Tear of medial meniscus of right knee 03/18/2015   Chronic pain of right knee 03/12/2015   Other and unspecified hyperlipidemia 11/25/2013   Nasal congestion 11/25/2013   Sinusitis, chronic 05/09/2013   Chronic low back pain 10/12/2012   Lumbar radiculopathy 10/12/2012   Hypertension 10/12/2012   Depression 10/12/2012   Insomnia 10/12/2012   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)    a.) s/p embolization 08/19/2020: 29 mm RIGHT internal iliac artery aneurysm   Anxiety    Aortic atherosclerosis (HCC)    Bilateral carpal tunnel syndrome 01/10/2018   Bipolar  disorder (HCC)    Chorioretinal inflammation of both eyes    a.) on azothioprine   Chronic lower back pain    Coronary artery calcification seen on CT scan    a.) cCTA 04/21/2022: Ca score 20.6 (61st percentile for age/sex match control)   DDD (degenerative disc disease), cervical    Depression    Diastolic dysfunction    a.) TTE 01/13/2021: EF 60-65%, mod LVH, triv MR, G1DD   GERD (gastroesophageal reflux disease)    Hepatic steatosis    History of alcohol abuse    History of nuclear stress test    Myoview 10/16: EF 50%, diaphragmatic attenuation, no ischemia, low risk   Hypertension    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   a.) CT head 12/25/2012 --> RIGHT basal ganglia hypoattenuation related to remote lacunar infarct   Lipoma    Long term (current) use of anticoagulants    a.) apixaban   Long-term current use of immunomodulator    a.) on azothioprine for peripheral focal chorioretinal inflammation (both eyes)   Marijuana use    Migraine    Moderate persistent asthma with acute exacerbation 05/02/2018   PAF (paroxysmal atrial fibrillation) (HCC) 03/27/2015   a.) CHA2DS2VASc = 5 (HTN, CVA x2, vascular disease history, T2DM);  b.) s/p ablation 10/02/2015; c.) s/p ablation 12/10/2015; d.) s/p DCCV (200 J x  1) 12/11/2015; e.) s/p ablation 04/28/2022; f.) rate/rhythm maintained on oral diltiazem + carvedilol; chronically anticoagulated with apixaban   Pilonidal cyst    Schizophrenia (HCC)    Sciatica neuralgia    T2DM (type 2 diabetes mellitus) (HCC)    Thoracic aortic ectasia (HCC) 01/13/2021   a.) TTE 01/13/2021: Ao root 38 mm, asc Ao 39 mm   Family History  Problem Relation Age of Onset   Heart disease Father    Schizophrenia Sister    Past Surgical History:  Procedure Laterality Date   ATRIAL FIBRILLATION ABLATION  09/22/2015   ATRIAL FIBRILLATION ABLATION N/A 04/28/2022   Procedure: ATRIAL FIBRILLATION ABLATION;  Surgeon: Regan Lemming, MD;  Location: [facility];  Service: Cardiovascular;  Laterality: N/A;   CATARACT EXTRACTION Bilateral    CYST EXCISION  1996-97   surgery back of head    ELECTROPHYSIOLOGIC STUDY N/A 09/22/2015   Procedure: Atrial Fibrillation Ablation;  Surgeon: Will Jorja Loa, MD;  Location: [facility];  Service: Cardiovascular;  Laterality: N/A;   ELECTROPHYSIOLOGIC STUDY N/A 12/10/2015   Procedure: Atrial Fibrillation Ablation;  Surgeon: Will Jorja Loa, MD;  Location: [facility];  Service: Cardiovascular;  Laterality: N/A;   ELECTROPHYSIOLOGIC STUDY N/A 12/11/2015   Procedure: Cardioversion;  Surgeon: Will Jorja Loa, MD;  Location: [facility];  Service: Cardiovascular;  Laterality: N/A;   EMBOLIZATION Right 08/19/2020   Procedure: EMBOLIZATION;  Surgeon: Leonie Douglas, MD;  Location: [facility];  Service: Cardiovascular;  Laterality: Right;  hypogastric   EXCISION MASS HEAD N/A 01/06/2017   Procedure: EXCISION MASS FOREHEAD;  Surgeon: Glenna Fellows, MD;  Location: [facility];  Service: Plastics;  Laterality: N/A;   EXCISION MASS UPPER EXTREMETIES Right 08/08/2022   Procedure: EXCISION MASS RIGHT FOREARM;  Surgeon: Betha Loa, MD;  Location: [facility];  Service:  Orthopedics;  Laterality: Right;  45 MIN   GANGLION CYST EXCISION Left    INTERCOSTAL NERVE BLOCK  07/19/2003   KNEE ARTHROSCOPY Right 07/18/2014   MASS EXCISION N/A 01/25/2021   Procedure: EXCISION SUBCUTANEOUS VS SUBFASCIAL MASS TORSO  3CM;  Surgeon: Glenna Fellows, MD;  Location: [facility];  Service: Plastics;  Laterality: N/A;   PILONIDAL CYST EXCISION N/A 09/13/2022   Procedure: CYST EXCISION PILONIDAL EXTENSIVE;  Surgeon: Henrene Dodge, MD;  Location: [facility];  Service: General;  Laterality: N/A;   Social History   Occupational History    Comment: disabled  Tobacco Use   Smoking status: Former    Years: 29    Types: Cigarettes    Quit date: 09/10/2022    Years since quitting: 0.2    Passive exposure: Never   Smokeless tobacco: Never   Tobacco comments:    1 cigarettes every day  06-02-2021  Vaping Use   Vaping Use: Never used  Substance and Sexual Activity   Alcohol use: Yes    Alcohol/week: 6.0 standard drinks of alcohol    Types: 6 Cans of beer per week    Comment: 6 pack weekly 05/26/22   Drug use: Yes    Types: Marijuana    Comment: sunday was last time   Sexual activity: Not Currently

## 2022-11-25 NOTE — Progress Notes (Unsigned)
Functional Pain Scale - descriptive words and definitions  Distracting (5)    Aware of pain/able to complete some ADL's but limited by pain/sleep is affected and active distractions are only slightly useful. Moderate range order  Average Pain  varies  Lower back pain on right that radiates into the right leg to the foot

## 2022-11-26 DIAGNOSIS — I480 Paroxysmal atrial fibrillation: Secondary | ICD-10-CM | POA: Diagnosis not present

## 2022-11-27 DIAGNOSIS — I480 Paroxysmal atrial fibrillation: Secondary | ICD-10-CM | POA: Diagnosis not present

## 2022-11-28 DIAGNOSIS — I480 Paroxysmal atrial fibrillation: Secondary | ICD-10-CM | POA: Diagnosis not present

## 2022-11-29 ENCOUNTER — Telehealth: Payer: Self-pay | Admitting: *Deleted

## 2022-11-29 ENCOUNTER — Ambulatory Visit (INDEPENDENT_AMBULATORY_CARE_PROVIDER_SITE_OTHER): Payer: Medicaid Other | Admitting: Allergy and Immunology

## 2022-11-29 VITALS — BP 144/98 | HR 76 | Temp 98.1°F | Resp 16 | Ht 72.0 in | Wt 248.1 lb

## 2022-11-29 DIAGNOSIS — Z961 Presence of intraocular lens: Secondary | ICD-10-CM | POA: Diagnosis not present

## 2022-11-29 DIAGNOSIS — J301 Allergic rhinitis due to pollen: Secondary | ICD-10-CM | POA: Diagnosis not present

## 2022-11-29 DIAGNOSIS — H3581 Retinal edema: Secondary | ICD-10-CM | POA: Diagnosis not present

## 2022-11-29 DIAGNOSIS — J988 Other specified respiratory disorders: Secondary | ICD-10-CM | POA: Diagnosis not present

## 2022-11-29 DIAGNOSIS — J4550 Severe persistent asthma, uncomplicated: Secondary | ICD-10-CM

## 2022-11-29 DIAGNOSIS — I10 Essential (primary) hypertension: Secondary | ICD-10-CM

## 2022-11-29 DIAGNOSIS — H1013 Acute atopic conjunctivitis, bilateral: Secondary | ICD-10-CM

## 2022-11-29 DIAGNOSIS — Z79899 Other long term (current) drug therapy: Secondary | ICD-10-CM | POA: Diagnosis not present

## 2022-11-29 DIAGNOSIS — H4043X2 Glaucoma secondary to eye inflammation, bilateral, moderate stage: Secondary | ICD-10-CM | POA: Diagnosis not present

## 2022-11-29 DIAGNOSIS — H1010 Acute atopic conjunctivitis, unspecified eye: Secondary | ICD-10-CM

## 2022-11-29 DIAGNOSIS — J3089 Other allergic rhinitis: Secondary | ICD-10-CM | POA: Diagnosis not present

## 2022-11-29 DIAGNOSIS — H30033 Focal chorioretinal inflammation, peripheral, bilateral: Secondary | ICD-10-CM | POA: Diagnosis not present

## 2022-11-29 DIAGNOSIS — I480 Paroxysmal atrial fibrillation: Secondary | ICD-10-CM | POA: Diagnosis not present

## 2022-11-29 MED ORDER — AMOXICILLIN-POT CLAVULANATE 875-125 MG PO TABS: 1.0000 | ORAL_TABLET | Freq: Every morning | ORAL | 0 refills | Status: AC

## 2022-11-29 MED ORDER — FLUTICASONE FUROATE-VILANTEROL 200-25 MCG/ACT IN AEPB: 1.0000 | INHALATION_SPRAY | Freq: Every day | RESPIRATORY_TRACT | 1 refills | Status: DC

## 2022-11-29 MED ORDER — AZELASTINE HCL 0.1 % NA SOLN: 1.0000 | Freq: Two times a day (BID) | NASAL | 1 refills | Status: DC

## 2022-11-29 MED ORDER — MONTELUKAST SODIUM 10 MG PO TABS: 10.0000 mg | ORAL_TABLET | Freq: Every day | ORAL | 1 refills | Status: DC

## 2022-11-29 MED ORDER — ALBUTEROL SULFATE HFA 108 (90 BASE) MCG/ACT IN AERS: 2.0000 | INHALATION_SPRAY | RESPIRATORY_TRACT | 0 refills | Status: DC | PRN

## 2022-11-29 MED ORDER — CETIRIZINE HCL 10 MG PO TABS: 10.0000 mg | ORAL_TABLET | Freq: Every day | ORAL | 1 refills | Status: DC | PRN

## 2022-11-29 MED ORDER — METHYLPREDNISOLONE ACETATE 80 MG/ML IJ SUSP
80.0000 mg | Freq: Once | INTRAMUSCULAR | Status: AC
  Administered 2022-11-29: 80 mg via INTRAMUSCULAR

## 2022-11-29 MED ORDER — OLOPATADINE HCL 0.2 % OP SOLN: 1.0000 [drp] | Freq: Every day | OPHTHALMIC | 1 refills | Status: DC | PRN

## 2022-11-29 NOTE — Telephone Encounter (Signed)
I s/w the pt and he has been added on to pre op schedule 12/05/22 10:40, due to med hold and procedure date. I did advised the pt to not take his blood thinner as of 12/05/22 which will begin the 3 days hold as indicated. I will update the requesting office as well.  Med rec and consent are done.

## 2022-11-29 NOTE — Patient Instructions (Addendum)
  1.  Continue to Treat and prevent inflammation:   A.  Flonase 1 spray each nostril twice a day  B.  Azelastine nasal spray 1 spray each nostril twice a day  C.  Montelukast 10 mg 1 tablet 1 time per day  D.  Breo 200 - 1 inhalation 1 time per day  E.  Immunotherapy  2. If needed:   A.  Cetirizine 10 mg 1 tablet 1-2 times a day  B.  Pataday - 1 drop each eye 1 time per day  C.  Pro-air HFA 2 puffs every 4-6 hours  3. For this recent event:   A. Depomedrol 80 IM delivered in clinic today  B. Augmentin 875 - 1 tablet 1 time a day for 10 days  C. Can add OTC Mucinex DM - 2 times per day  D. Can add OTC nasal saline a few times per day  4. Return to clinic in 6 months or earlier if problem

## 2022-11-29 NOTE — Telephone Encounter (Signed)
Patient with diagnosis of afib on Eliquis for anticoagulation.    Procedure:   CERVICAL INTERLAMINAR ESI  Date of procedure: 12/08/22   CHA2DS2-VASc Score = 5   This indicates a 7.2% annual risk of stroke. The patient's score is based upon: CHF History: 0 HTN History: 1 Diabetes History: 1 Stroke History: 2 Vascular Disease History: 1 Age Score: 0 Gender Score: 0      CrCl 103 ml/min  Per office protocol, patient can hold Eliquis for 3 days prior to procedure.    Patient previously cleared by Dr. Elberta Fortis to hold 3 days for a similar procedure.  **This guidance is not considered finalized until pre-operative APP has relayed final recommendations.**

## 2022-11-29 NOTE — Telephone Encounter (Signed)
   Pre-operative Risk Assessment    Patient Name: Jason Stokes  DOB: January 28, 1961 MRN: [id number]      Request for Surgical Clearance    Procedure:   CERVICAL INTERLAMINAR ESI  Date of Surgery:  Clearance 12/08/22                                 Surgeon:  DR. FRED NEWTON Surgeon's Group or Practice Name:  [facility] Phone number:  [phone]; [name], RMA Fax number:  [phone]   Type of Clearance Requested:   - Medical  - Pharmacy:  Hold Apixaban (Eliquis) ELIQUIS x 2 DAYS PRIOR   Type of Anesthesia:  Not Indicated   Additional requests/questions:    Elpidio Anis   11/29/2022, 12:05 PM

## 2022-11-29 NOTE — Progress Notes (Unsigned)
[place] - [place] - [place] - [place] - [place]   Follow-up Note  Referring Provider: Hoy Register, MD Primary Provider: Hoy Register, MD Date of Office Visit: 11/29/2022  Subjective:   Jason Stokes (DOB: 1960/09/21) is a 62 y.o. male who [facility] in re-evaluation of the following:  HPI: Jason Stokes returns to this clinic in evaluation of asthma, allergic rhinoconjunctivitis, and occasional cannabis smoke exposure.  I last saw him in this clinic 31 May 2022.  He was really doing wonderful since his last visit and did not require systemic steroid or antibiotic for any type of airway issue and had minimal requirement for short acting bronchodilator while he continued on immunotherapy, Breo, montelukast, and Flonase.  Unfortunately, about 10 days ago he got really stuffy and nasal congestion and sneezing and his head feels full and has had wheezing.  He has not been around anyone who has been sick.  He did have a nosebleed this morning.  Allergies as of 11/29/2022       Reactions   Lisinopril Anaphylaxis   Swelling of lips and tongue.   Tylenol [acetaminophen] Other (See Comments)   Acid reflux    Lyrica [pregabalin] Palpitations        Medication List    Accu-Chek Guide test strip Generic drug: glucose blood Use to check blood sugar once daily. E11.69   albuterol 108 (90 Base) MCG/ACT inhaler Commonly known as: Ventolin HFA Inhale 2 puffs into the lungs every 4 (four) hours as needed for wheezing or shortness of breath.   apixaban 5 MG Tabs tablet Commonly known as: Eliquis Take 1 tablet (5 mg total) by mouth 2 (two) times daily.   atorvastatin 40 MG tablet Commonly known as: LIPITOR Take 1 tablet (40 mg total) by mouth daily.   azaTHIOprine 50 MG tablet Commonly known as: IMURAN Take 100 mg by mouth every morning.   brimonidine 0.2 % ophthalmic solution Commonly known as: ALPHAGAN Place 1 drop  into both eyes every 8 (eight) hours.   carvedilol 25 MG tablet Commonly known as: COREG Take 1 tablet (25 mg total) by mouth 2 (two) times daily.   cetirizine 10 MG tablet Commonly known as: ZYRTEC Take 1 tablet (10 mg total) by mouth daily as needed for allergies (Can take an extra dose during flare ups.).   Difluprednate 0.05 % Emul Place 1 drop into both eyes 2 (two) times daily.   diltiazem 180 MG 24 hr capsule Commonly known as: CARDIZEM CD TAKE 1 CAPSULE BY MOUTH EVERY DAY   dorzolamide-timolol 2-0.5 % ophthalmic solution Commonly known as: COSOPT Place 1 drop into both eyes 2 (two) times daily.   DULoxetine 60 MG capsule Commonly known as: Cymbalta Take 1 capsule (60 mg total) by mouth daily. For chronic pain and neuropathy   EPINEPHrine 0.3 mg/0.3 mL Soaj injection Commonly known as: EPI-PEN Inject 0.3 mg into the muscle as needed for anaphylaxis.   fluticasone 50 MCG/ACT nasal spray Commonly known as: FLONASE 1 spray each nostril twice a day   fluticasone furoate-vilanterol 200-25 MCG/ACT Aepb Commonly known as: Breo Ellipta Inhale 1 puff into the lungs daily.   folic acid 1 MG tablet Commonly known as: FOLVITE Take 1 mg by mouth daily.   furosemide 20 MG tablet Commonly known as: LASIX Take 1 tablet (20 mg total) by mouth daily.   GARLIC PO Take 1 tablet by mouth daily.   latanoprost 0.005 % ophthalmic solution Commonly  known as: XALATAN Place 1 drop into both eyes daily.   metFORMIN 500 MG tablet Commonly known as: GLUCOPHAGE Take 1 tablet (500 mg total) by mouth 2 (two) times daily with a meal. For 1 week then increase to 2 tablets twice a day   methocarbamol 500 MG tablet Commonly known as: ROBAXIN TAKE 1 TABLET BY MOUTH EVERY 8 HOURS AS NEEDED FOR MUSCLE SPASM   Misc. Devices Misc Back brace. Diagnosis chronic back pain   Misc. Devices Misc Left knee brace. Diagnosis L knee pain   montelukast 10 MG tablet Commonly known as:  Singulair Take 1 tablet (10 mg total) by mouth at bedtime.   Olopatadine HCl 0.2 % Soln Commonly known as: Pataday Place 1 drop into both eyes daily as needed.   omeprazole 20 MG capsule Commonly known as: PRILOSEC TAKE 1 CAPSULE BY MOUTH EVERY DAY   Oxycodone HCl 10 MG Tabs Take 1 tablet (10 mg total) by mouth every 4 (four) hours as needed for severe pain.   vitamin D3 25 MCG tablet Commonly known as: CHOLECALCIFEROL Take 1,000 Units by mouth daily.    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)    a.) s/p embolization 08/19/2020: 29 mm RIGHT internal iliac artery aneurysm   Anxiety    Aortic atherosclerosis (HCC)    Bilateral carpal tunnel syndrome 01/10/2018   Bipolar disorder (HCC)    Chorioretinal inflammation of both eyes    a.) on azothioprine   Chronic lower back pain    Coronary artery calcification seen on CT scan    a.) cCTA 04/21/2022: Ca score 20.6 (61st percentile for age/sex match control)   DDD (degenerative disc disease), cervical    Depression    Diastolic dysfunction    a.) TTE 01/13/2021: EF 60-65%, mod LVH, triv MR, G1DD   GERD (gastroesophageal reflux disease)    Hepatic steatosis    History of alcohol abuse    History of nuclear stress test    Myoview 10/16: EF 50%, diaphragmatic attenuation, no ischemia, low risk   Hypertension    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   a.) CT head 12/25/2012 --> RIGHT basal ganglia hypoattenuation related to remote lacunar infarct   Lipoma    Long term (current) use of anticoagulants    a.) apixaban   Long-term current use of immunomodulator    a.) on azothioprine for peripheral focal chorioretinal inflammation (both eyes)   Marijuana use    Migraine    Moderate persistent asthma with acute exacerbation 05/02/2018   PAF (paroxysmal atrial fibrillation) (HCC) 03/27/2015   a.) CHA2DS2VASc = 5 (HTN, CVA x2, vascular disease history, T2DM);  b.) s/p ablation  10/02/2015; c.) s/p ablation 12/10/2015; d.) s/p DCCV (200 J x 1) 12/11/2015; e.) s/p ablation 04/28/2022; f.) rate/rhythm maintained on oral diltiazem + carvedilol; chronically anticoagulated with apixaban   Pilonidal cyst    Schizophrenia (HCC)    Sciatica neuralgia    T2DM (type 2 diabetes mellitus) (HCC)    Thoracic aortic ectasia (HCC) 01/13/2021   a.) TTE 01/13/2021: Ao root 38 mm, asc Ao 39 mm    Past Surgical History:  Procedure Laterality Date   ATRIAL FIBRILLATION ABLATION  09/22/2015   ATRIAL FIBRILLATION ABLATION N/A 04/28/2022   Procedure: ATRIAL FIBRILLATION ABLATION;  Surgeon: Regan Lemming, MD;  Location: [facility];  Service: Cardiovascular;  Laterality: N/A;   CATARACT EXTRACTION Bilateral    CYST EXCISION  1610-96   surgery back of head    ELECTROPHYSIOLOGIC STUDY N/A 09/22/2015   Procedure: Atrial Fibrillation Ablation;  Surgeon: Will Jorja Loa, MD;  Location: [facility];  Service: Cardiovascular;  Laterality: N/A;   ELECTROPHYSIOLOGIC STUDY N/A 12/10/2015   Procedure: Atrial Fibrillation Ablation;  Surgeon: Will Jorja Loa, MD;  Location: [facility];  Service: Cardiovascular;  Laterality: N/A;   ELECTROPHYSIOLOGIC STUDY N/A 12/11/2015   Procedure: Cardioversion;  Surgeon: Will Jorja Loa, MD;  Location: [facility];  Service: Cardiovascular;  Laterality: N/A;   EMBOLIZATION Right 08/19/2020   Procedure: EMBOLIZATION;  Surgeon: Leonie Douglas, MD;  Location: [facility];  Service: Cardiovascular;  Laterality: Right;  hypogastric   EXCISION MASS HEAD N/A 01/06/2017   Procedure: EXCISION MASS FOREHEAD;  Surgeon: Glenna Fellows, MD;  Location: [facility];  Service: Plastics;  Laterality: N/A;   EXCISION MASS UPPER EXTREMETIES Right 08/08/2022   Procedure: EXCISION MASS RIGHT FOREARM;  Surgeon: Betha Loa, MD;  Location: [facility];  Service: Orthopedics;  Laterality: Right;  45  MIN   GANGLION CYST EXCISION Left    INTERCOSTAL NERVE BLOCK  07/19/2003   KNEE ARTHROSCOPY Right 07/18/2014   MASS EXCISION N/A 01/25/2021   Procedure: EXCISION SUBCUTANEOUS VS SUBFASCIAL MASS TORSO 3CM;  Surgeon: Glenna Fellows, MD;  Location: [facility];  Service: Plastics;  Laterality: N/A;   PILONIDAL CYST EXCISION N/A 09/13/2022   Procedure: CYST EXCISION PILONIDAL EXTENSIVE;  Surgeon: Henrene Dodge, MD;  Location: [facility];  Service: General;  Laterality: N/A;    Review of systems negative except as noted in HPI / PMHx or noted below:  Review of Systems  Constitutional: Negative.   HENT: Negative.    Eyes: Negative.   Respiratory: Negative.    Cardiovascular: Negative.   Gastrointestinal: Negative.   Genitourinary: Negative.   Musculoskeletal: Negative.   Skin: Negative.   Neurological: Negative.   Endo/Heme/Allergies: Negative.   Psychiatric/Behavioral: Negative.       Objective:   Vitals:   11/29/22 1042 11/29/22 1119  BP: (!) 148/98 (!) 144/98  Pulse: 76   Resp: 16   Temp: 98.1 F (36.7 C)   SpO2: 98%    Height: 6' (182.9 cm)  Weight: 248 lb 1.6 oz (112.5 kg)   Physical Exam Constitutional:      Appearance: He is not diaphoretic.  HENT:     Head: Normocephalic.     Right Ear: Tympanic membrane, ear canal and external ear normal.     Left Ear: Tympanic membrane, ear canal and external ear normal.     Nose: Mucosal edema (Punctate area bleeding right septum) present. No rhinorrhea.     Mouth/Throat:     Pharynx: Uvula midline. No oropharyngeal exudate.  Eyes:     Conjunctiva/sclera: Conjunctivae normal.  Neck:     Thyroid: No thyromegaly.     Trachea: Trachea normal. No tracheal tenderness or tracheal deviation.  Cardiovascular:     Rate and Rhythm: Normal rate and regular rhythm.     Heart sounds: Normal heart sounds, S1 normal and S2 normal. No murmur heard. Pulmonary:     Effort: No respiratory distress.     Breath sounds:  Normal breath sounds. No stridor. No wheezing or rales.  Lymphadenopathy:     Head:     Right side of head: No tonsillar adenopathy.     Left side of head: No tonsillar adenopathy.     Cervical:  No cervical adenopathy.  Skin:    Findings: No erythema or rash.     Nails: There is no clubbing.  Neurological:     Mental Status: He is alert.     Diagnostics:    Spirometry was performed and demonstrated an FEV1 of 3.24 at 98 % of predicted.  Assessment and Plan:   1. Perennial allergic rhinitis   2. Seasonal allergic rhinitis due to pollen   3. Asthma, severe persistent, well-controlled   4. Perennial allergic conjunctivitis of both eyes   5. Seasonal allergic conjunctivitis   6. Respiratory tract infection    1.  Continue to Treat and prevent inflammation:   A.  Flonase 1 spray each nostril twice a day  B.  Azelastine nasal spray 1 spray each nostril twice a day  C.  Montelukast 10 mg 1 tablet 1 time per day  D.  Breo 200 - 1 inhalation 1 time per day  E.  Immunotherapy  2. If needed:   A.  Cetirizine 10 mg 1 tablet 1-2 times a day  B.  Pataday - 1 drop each eye 1 time per day  C.  Pro-air HFA 2 puffs every 4-6 hours  3. For this recent event:   A. Depomedrol 80 IM delivered in clinic today  B. Augmentin 875 - 1 tablet 1 time a day for 10 days  C. Can add OTC Mucinex DM - 2 times per day  D. Can add OTC nasal saline a few times per day  4. Return to clinic in 6 months or earlier if problem  Gaylan appears to have a respiratory tract infection and we will treat him with the plan noted above to address this issue and he will remain on anti-inflammatory agents for his airway including the use of immunotherapy and assuming he does well with this approach we will see him back in this clinic in 6 months or earlier if there is a problem.   Laurette Schimke, MD Allergy / Immunology [facility]

## 2022-11-29 NOTE — Telephone Encounter (Signed)
   Name: Jason Stokes  DOB: 06-12-1961  MRN: [id number]  Primary Cardiologist: Will Jorja Loa, MD  Chart reviewed as part of pre-operative protocol coverage. Because of [name]'s past medical history and time since last visit, he will require a follow-up telephone visit in order to better assess preoperative cardiovascular risk.  Pre-op covering staff: - Please schedule appointment and call patient to inform them. If patient already had an upcoming appointment within acceptable timeframe, please add "pre-op clearance" to the appointment notes so provider is aware. - Please contact requesting surgeon's office via preferred method (i.e, phone, fax) to inform them of need for appointment prior to surgery.  Per office protocol, patient can hold Eliquis for 3 days prior to procedure.    Sharlene Dory, PA-C  11/29/2022, 3:57 PM

## 2022-11-29 NOTE — Telephone Encounter (Signed)
I s/w the pt and he has been added on to pre op schedule 12/05/22 10:40, due to med hold and procedure date. I did advised the pt to not take his blood thinner as of 12/05/22 which will begin the 3 days hold as indicated. I will update the requesting office as well.  Med rec and consent are done.      Patient Consent for Virtual Visit        Jason Stokes has provided verbal consent on 11/29/2022 for a virtual visit (video or telephone).   CONSENT FOR VIRTUAL VISIT FOR:  Jason Stokes  By participating in this virtual visit I agree to the following:  I hereby voluntarily request, consent and authorize [name] and its employed or contracted physicians, physician assistants, nurse practitioners or other licensed health care professionals (the Practitioner), to provide me with telemedicine health care services (the "Services") as deemed necessary by the treating Practitioner. I acknowledge and consent to receive the Services by the Practitioner via telemedicine. I understand that the telemedicine visit will involve communicating with the Practitioner through live audiovisual communication technology and the disclosure of certain medical information by electronic transmission. I acknowledge that I have been given the opportunity to request an in-person assessment or other available alternative prior to the telemedicine visit and am voluntarily participating in the telemedicine visit.  I understand that I have the right to withhold or withdraw my consent to the use of telemedicine in the course of my care at any time, without affecting my right to future care or treatment, and that the Practitioner or I may terminate the telemedicine visit at any time. I understand that I have the right to inspect all information obtained and/or recorded in the course of the telemedicine visit and may receive copies of available information for a reasonable fee.  I understand that some of the potential  risks of receiving the Services via telemedicine include:  Delay or interruption in medical evaluation due to technological equipment failure or disruption; Information transmitted may not be sufficient (e.g. poor resolution of images) to allow for appropriate medical decision making by the Practitioner; and/or  In rare instances, security protocols could fail, causing a breach of personal health information.  Furthermore, I acknowledge that it is my responsibility to provide information about my medical history, conditions and care that is complete and accurate to the best of my ability. I acknowledge that Practitioner's advice, recommendations, and/or decision may be based on factors not within their control, such as incomplete or inaccurate data provided by me or distortions of diagnostic images or specimens that may result from electronic transmissions. I understand that the practice of medicine is not an exact science and that Practitioner makes no warranties or guarantees regarding treatment outcomes. I acknowledge that a copy of this consent can be made available to me via my patient portal [name]), or I can request a printed copy by calling the office of [name].    I understand that my insurance will be billed for this visit.   I have read or had this consent read to me. I understand the contents of this consent, which adequately explains the benefits and risks of the Services being provided via telemedicine.  I have been provided ample opportunity to ask questions regarding this consent and the Services and have had my questions answered to my satisfaction. I give my informed consent for the services to be provided through the use of  telemedicine in my medical care

## 2022-11-30 DIAGNOSIS — H30033 Focal chorioretinal inflammation, peripheral, bilateral: Secondary | ICD-10-CM | POA: Diagnosis not present

## 2022-11-30 DIAGNOSIS — I480 Paroxysmal atrial fibrillation: Secondary | ICD-10-CM | POA: Diagnosis not present

## 2022-11-30 DIAGNOSIS — Z79899 Other long term (current) drug therapy: Secondary | ICD-10-CM | POA: Diagnosis not present

## 2022-12-01 ENCOUNTER — Telehealth: Payer: Self-pay

## 2022-12-01 DIAGNOSIS — I480 Paroxysmal atrial fibrillation: Secondary | ICD-10-CM | POA: Diagnosis not present

## 2022-12-01 LAB — SPIROMETRY WITH GRAPH

## 2022-12-01 NOTE — Telephone Encounter (Signed)
   Pre-operative Risk Assessment    Patient Name: Jason Stokes  DOB: 1961-07-16 MRN: [id number]      Request for Surgical Clearance    Procedure:   L Shoulder  Arthroscopy/RC Repair   Date of Surgery:  Clearance TBD                                 Surgeon: Annell Greening, MD Surgeon's Group or Practice Name:  [facility] number:  [phone] Fax number:  [phone]   Type of Clearance Requested:   - Medical  - Pharmacy:  Hold Apixaban (Eliquis)     Type of Anesthesia:   General    Additional requests/questions:   N/A  Berneda Rose   12/01/2022, 2:40 PM

## 2022-12-01 NOTE — Telephone Encounter (Signed)
Patient is also pending ESI and has appointment with APP for cardiac risk assessment on 12/05/22. Will route to Bernadene Person, NP for her awareness.  Levi Aland, NP-C  12/01/2022, 4:41 PM [address], [address] Office [phone] Fax [phone]

## 2022-12-02 DIAGNOSIS — I480 Paroxysmal atrial fibrillation: Secondary | ICD-10-CM | POA: Diagnosis not present

## 2022-12-02 NOTE — Telephone Encounter (Addendum)
Patient with diagnosis of afib on Eliquis for anticoagulation.    Procedure:  L Shoulder  Arthroscopy/RC Repair  Date of procedure: TBD   CHA2DS2-VASc Score = 5   This indicates a 7.2% annual risk of stroke. The patient's score is based upon: CHF History: 0 HTN History: 1 Diabetes History: 1 Stroke History: 2 Vascular Disease History: 1 Age Score: 0 Gender Score: 0      CrCl 76 ml/min  Per office protocol, patient can hold Eliquis for 3 days prior to procedure.   Patient previously cleared by Dr. Elberta Fortis to hold 3 days.  **This guidance is not considered finalized until pre-operative APP has relayed final recommendations.**

## 2022-12-03 DIAGNOSIS — I480 Paroxysmal atrial fibrillation: Secondary | ICD-10-CM | POA: Diagnosis not present

## 2022-12-04 DIAGNOSIS — I480 Paroxysmal atrial fibrillation: Secondary | ICD-10-CM | POA: Diagnosis not present

## 2022-12-05 ENCOUNTER — Ambulatory Visit: Payer: Medicaid Other | Attending: Cardiovascular Disease | Admitting: Nurse Practitioner

## 2022-12-05 DIAGNOSIS — Z01810 Encounter for preprocedural cardiovascular examination: Secondary | ICD-10-CM

## 2022-12-05 DIAGNOSIS — I480 Paroxysmal atrial fibrillation: Secondary | ICD-10-CM | POA: Diagnosis not present

## 2022-12-05 NOTE — Progress Notes (Signed)
Virtual Visit via Telephone Note   Because of Jason Stokes's co-morbid illnesses, he is at least at moderate risk for complications without adequate follow up.  This format is felt to be most appropriate for this patient at this time.  The patient did not have access to video technology/had technical difficulties with video requiring transitioning to audio format only (telephone).  All issues noted in this document were discussed and addressed.  No physical exam could be performed with this format.  Please refer to the patient's chart for his consent to telehealth for [facility].  Evaluation Performed:  Preoperative cardiovascular risk assessment _____________   Date:  12/05/2022   Patient ID:  Jason Stokes, DOB 05-Oct-1960, MRN [id number] Patient Location:  Home Provider location:   Office  Primary Care Provider:  Hoy Register, MD Primary Cardiologist:  Will Jorja Loa, MD  Chief Complaint / Patient Profile   62 y.o. y/o male with a h/o paroxysmal atrial fibrillation, hypertension, and CVA who is pending Intralaminar ESI on 12/08/2022 with Dr. Naaman Plummer of [facility] as well as left shoulder arthroscopy/RC repair with Dr. Prince Solian of [facility] and presents today for telephonic preoperative cardiovascular risk assessment.  History of Present Illness    Jason Stokes is a 62 y.o. male who presents via audio/video conferencing for a telehealth visit today.  Pt was last seen in cardiology clinic on 08/02/2022 by Dr. Elberta Fortis.  At that time Jason Stokes was doing well.  The patient is now pending procedure as outlined above. Since his last visit, he has done well from a cardiac standpoint.  He has had some nonpitting bilateral lower extremity edema, improved with oral Lasix.  He denies chest pain, palpitations, dyspnea, pnd, orthopnea, n, v, dizziness, syncope, weight gain, or early satiety. All other systems reviewed and are otherwise negative  except as noted above.   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)    a.) s/p embolization 08/19/2020: 29 mm RIGHT internal iliac artery aneurysm   Anxiety    Aortic atherosclerosis (HCC)    Bilateral carpal tunnel syndrome 01/10/2018   Bipolar disorder (HCC)    Chorioretinal inflammation of both eyes    a.) on azothioprine   Chronic lower back pain    Coronary artery calcification seen on CT scan    a.) cCTA 04/21/2022: Ca score 20.6 (61st percentile for age/sex match control)   DDD (degenerative disc disease), cervical    Depression    Diastolic dysfunction    a.) TTE 01/13/2021: EF 60-65%, mod LVH, triv MR, G1DD   GERD (gastroesophageal reflux disease)    Hepatic steatosis    History of alcohol abuse    History of nuclear stress test    Myoview 10/16: EF 50%, diaphragmatic attenuation, no ischemia, low risk   Hypertension    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   a.) CT head 12/25/2012 --> RIGHT basal ganglia hypoattenuation related to remote lacunar infarct   Lipoma    Long term (current) use of anticoagulants    a.) apixaban   Long-term current use of immunomodulator    a.) on azothioprine for peripheral focal chorioretinal inflammation (both eyes)   Marijuana use    Migraine    Moderate persistent asthma with acute exacerbation 05/02/2018   PAF (paroxysmal atrial fibrillation) (HCC) 03/27/2015   a.) CHA2DS2VASc = 5 (HTN,  CVA x2, vascular disease history, T2DM);  b.) s/p ablation 10/02/2015; c.) s/p ablation 12/10/2015; d.) s/p DCCV (200 J x 1) 12/11/2015; e.) s/p ablation 04/28/2022; f.) rate/rhythm maintained on oral diltiazem + carvedilol; chronically anticoagulated with apixaban   Pilonidal cyst    Schizophrenia (HCC)    Sciatica neuralgia    T2DM (type 2 diabetes mellitus) (HCC)    Thoracic aortic ectasia (HCC) 01/13/2021   a.) TTE 01/13/2021: Ao root 38 mm, asc Ao 39 mm   Past  Surgical History:  Procedure Laterality Date   ATRIAL FIBRILLATION ABLATION  09/22/2015   ATRIAL FIBRILLATION ABLATION N/A 04/28/2022   Procedure: ATRIAL FIBRILLATION ABLATION;  Surgeon: Regan Lemming, MD;  Location: [facility];  Service: Cardiovascular;  Laterality: N/A;   CATARACT EXTRACTION Bilateral    CYST EXCISION  1996-97   surgery back of head    ELECTROPHYSIOLOGIC STUDY N/A 09/22/2015   Procedure: Atrial Fibrillation Ablation;  Surgeon: Will Jorja Loa, MD;  Location: [facility];  Service: Cardiovascular;  Laterality: N/A;   ELECTROPHYSIOLOGIC STUDY N/A 12/10/2015   Procedure: Atrial Fibrillation Ablation;  Surgeon: Will Jorja Loa, MD;  Location: [facility];  Service: Cardiovascular;  Laterality: N/A;   ELECTROPHYSIOLOGIC STUDY N/A 12/11/2015   Procedure: Cardioversion;  Surgeon: Will Jorja Loa, MD;  Location: [facility];  Service: Cardiovascular;  Laterality: N/A;   EMBOLIZATION Right 08/19/2020   Procedure: EMBOLIZATION;  Surgeon: Leonie Douglas, MD;  Location: [facility];  Service: Cardiovascular;  Laterality: Right;  hypogastric   EXCISION MASS HEAD N/A 01/06/2017   Procedure: EXCISION MASS FOREHEAD;  Surgeon: Glenna Fellows, MD;  Location: [facility];  Service: Plastics;  Laterality: N/A;   EXCISION MASS UPPER EXTREMETIES Right 08/08/2022   Procedure: EXCISION MASS RIGHT FOREARM;  Surgeon: Betha Loa, MD;  Location: [facility];  Service: Orthopedics;  Laterality: Right;  45 MIN   GANGLION CYST EXCISION Left    INTERCOSTAL NERVE BLOCK  07/19/2003   KNEE ARTHROSCOPY Right 07/18/2014   MASS EXCISION N/A 01/25/2021   Procedure: EXCISION SUBCUTANEOUS VS SUBFASCIAL MASS TORSO 3CM;  Surgeon: Glenna Fellows, MD;  Location: [facility];  Service: Plastics;  Laterality: N/A;   PILONIDAL CYST EXCISION N/A 09/13/2022   Procedure: CYST EXCISION PILONIDAL EXTENSIVE;  Surgeon:  Henrene Dodge, MD;  Location: [facility];  Service: General;  Laterality: N/A;    Allergies  Allergies  Allergen Reactions   Lisinopril Anaphylaxis    Swelling of lips and tongue.   Tylenol [Acetaminophen] Other (See Comments)    Acid reflux    Lyrica [Pregabalin] Palpitations    Home Medications    Prior to Admission medications   Medication Sig Start Date End Date Taking? Authorizing Provider  albuterol (VENTOLIN HFA) 108 (90 Base) MCG/ACT inhaler Inhale 2 puffs into the lungs every 4 (four) hours as needed for wheezing or shortness of breath. 11/29/22   Kozlow, Alvira Philips, MD  amoxicillin-clavulanate (AUGMENTIN) 875-125 MG tablet Take 1 tablet by mouth in the morning for 10 days. 11/29/22 12/09/22  Kozlow, Alvira Philips, MD  apixaban (ELIQUIS) 5 MG TABS tablet Take 1 tablet (5 mg total) by mouth 2 (two) times daily. 06/27/22   Camnitz, Andree Coss, MD  atorvastatin (LIPITOR) 40 MG tablet Take 1 tablet (40 mg total) by mouth daily. Patient taking differently: Take 40 mg by mouth every morning. 08/16/22   Hoy Register, MD  azaTHIOprine (IMURAN) 50 MG tablet Take 100  mg by mouth every morning.    [provider]  azelastine (ASTELIN) 0.1 % nasal spray Place 1 spray into both nostrils 2 (two) times daily. 1 spray each nostril twice a day 11/29/22   Kozlow, Alvira Philips, MD  brimonidine (ALPHAGAN) 0.2 % ophthalmic solution Place 1 drop into both eyes every 8 (eight) hours.    [provider]  carvedilol (COREG) 25 MG tablet Take 1 tablet (25 mg total) by mouth 2 (two) times daily. 06/27/22   Camnitz, Andree Coss, MD  cetirizine (ZYRTEC) 10 MG tablet Take 1 tablet (10 mg total) by mouth daily as needed for allergies (Can take an extra dose during flare ups.). 11/29/22   Kozlow, Alvira Philips, MD  Difluprednate 0.05 % EMUL Place 1 drop into both eyes 2 (two) times daily.    [provider]  diltiazem (CARDIZEM CD) 180 MG 24 hr capsule TAKE 1 CAPSULE BY MOUTH EVERY DAY Patient taking  differently: 180 mg every morning. TAKE 1 CAPSULE BY MOUTH EVERY DAY 06/27/22   Camnitz, Andree Coss, MD  dorzolamide-timolol (COSOPT) 22.3-6.8 MG/ML ophthalmic solution Place 1 drop into both eyes 2 (two) times daily.    [provider]  DULoxetine (CYMBALTA) 60 MG capsule Take 1 capsule (60 mg total) by mouth daily. For chronic pain and neuropathy 08/16/22   Hoy Register, MD  EPINEPHrine 0.3 mg/0.3 mL IJ SOAJ injection Inject 0.3 mg into the muscle as needed for anaphylaxis. 02/10/22   Kozlow, Alvira Philips, MD  fluticasone (FLONASE) 50 MCG/ACT nasal spray 1 spray each nostril twice a day Patient taking differently: Place 1 spray into both nostrils as needed. 1 spray each nostril twice a day 05/31/22   Kozlow, Alvira Philips, MD  fluticasone furoate-vilanterol (BREO ELLIPTA) 200-25 MCG/ACT AEPB Inhale 1 puff into the lungs daily. 11/29/22   Kozlow, Alvira Philips, MD  folic acid (FOLVITE) 1 MG tablet Take 1 mg by mouth daily. 10/16/20   [provider]  furosemide (LASIX) 20 MG tablet Take 1 tablet (20 mg total) by mouth daily. 11/15/22   Hoy Register, MD  GARLIC PO Take 1 tablet by mouth daily.    [provider]  glucose blood (ACCU-CHEK GUIDE) test strip Use to check blood sugar once daily. E11.69 08/31/22   Hoy Register, MD  latanoprost (XALATAN) 0.005 % ophthalmic solution Place 1 drop into both eyes daily. 07/04/22   [provider]  metFORMIN (GLUCOPHAGE) 500 MG tablet Take 1 tablet (500 mg total) by mouth 2 (two) times daily with a meal. For 1 week then increase to 2 tablets twice a day 08/16/22   Hoy Register, MD  methocarbamol (ROBAXIN) 500 MG tablet TAKE 1 TABLET BY MOUTH EVERY 8 HOURS AS NEEDED FOR MUSCLE SPASM 08/03/22   Magnant, Joycie Peek, PA-C  Misc. Devices MISC Back brace. Diagnosis chronic back pain 06/16/20   Hoy Register, MD  Misc. Devices MISC Left knee brace. Diagnosis L knee pain 06/16/20   Hoy Register, MD  montelukast (SINGULAIR) 10 MG tablet Take 1  tablet (10 mg total) by mouth at bedtime. 11/29/22   Kozlow, Alvira Philips, MD  Olopatadine HCl (PATADAY) 0.2 % SOLN Place 1 drop into both eyes daily as needed. 11/29/22   Kozlow, Alvira Philips, MD  omeprazole (PRILOSEC) 20 MG capsule TAKE 1 CAPSULE BY MOUTH EVERY DAY Patient taking differently: 20 mg every morning. TAKE 1 CAPSULE BY MOUTH EVERY DAY 06/23/22   Hoy Register, MD  oxyCODONE 10 MG TABS Take 1 tablet (10  mg total) by mouth every 4 (four) hours as needed for severe pain. 10/27/22   Donovan Kail, PA-C  Vitamin D3 (VITAMIN D) 25 MCG tablet Take 1,000 Units by mouth daily.    [provider]    Physical Exam    Vital Signs:  Jason Stokes does not have vital signs available for review today.  Given telephonic nature of communication, physical exam is limited. AAOx3. NAD. Normal affect.  Speech and respirations are unlabored.  Accessory Clinical Findings    None  Assessment & Plan    1.  Preoperative Cardiovascular Risk Assessment:  According to the Revised Cardiac Risk Index (RCRI), his Perioperative Risk of Major Cardiac Event is (%): 0.9. His Functional Capacity in METs is: 5.78 according to the Duke Activity Status Index (DASI). Therefore, based on ACC/AHA guidelines, patient would be at  acceptable risk for the planned procedure without further cardiovascular testing.   The patient was advised that if he develops new symptoms prior to surgery to contact our office to arrange for a follow-up visit, and he verbalized understanding.  Per office protocol, patient can hold Eliquis for 3 days prior to shoulder surgery and ESI (patient previously cleared by Dr. Elberta Fortis to hold 3 days). Please resume Eliquis as soon as possible postprocedure, at the discretion of the surgeon.    A copy of this note will be routed to requesting surgeon.  Time:   Today, I have spent 5 minutes with the patient with telehealth technology discussing medical history, symptoms, and management plan.      Joylene Grapes, NP  12/05/2022, 10:48 AM

## 2022-12-06 DIAGNOSIS — I480 Paroxysmal atrial fibrillation: Secondary | ICD-10-CM | POA: Diagnosis not present

## 2022-12-06 DIAGNOSIS — M65341 Trigger finger, right ring finger: Secondary | ICD-10-CM | POA: Diagnosis not present

## 2022-12-07 ENCOUNTER — Ambulatory Visit (INDEPENDENT_AMBULATORY_CARE_PROVIDER_SITE_OTHER): Payer: Medicaid Other

## 2022-12-07 DIAGNOSIS — J309 Allergic rhinitis, unspecified: Secondary | ICD-10-CM

## 2022-12-07 DIAGNOSIS — I480 Paroxysmal atrial fibrillation: Secondary | ICD-10-CM | POA: Diagnosis not present

## 2022-12-08 ENCOUNTER — Other Ambulatory Visit: Payer: Self-pay

## 2022-12-08 ENCOUNTER — Ambulatory Visit: Payer: Medicaid Other | Admitting: Physical Medicine and Rehabilitation

## 2022-12-08 VITALS — BP 138/83 | HR 72

## 2022-12-08 DIAGNOSIS — I480 Paroxysmal atrial fibrillation: Secondary | ICD-10-CM | POA: Diagnosis not present

## 2022-12-08 DIAGNOSIS — M5412 Radiculopathy, cervical region: Secondary | ICD-10-CM | POA: Diagnosis not present

## 2022-12-08 MED ORDER — METHYLPREDNISOLONE ACETATE 80 MG/ML IJ SUSP
80.0000 mg | Freq: Once | INTRAMUSCULAR | Status: AC
Start: 2022-12-08 — End: 2022-12-08
  Administered 2022-12-08: 80 mg

## 2022-12-08 NOTE — Progress Notes (Signed)
Functional Pain Scale - descriptive words and definitions  Distracting (5)    Aware of pain/able to complete some ADL's but limited by pain/sleep is affected and active distractions are only slightly useful. Moderate range order  Average Pain  varies   +Driver, -BT, -Dye Allergies.  Neck pain on right side that radiates into right arm. Pain medication helps pain

## 2022-12-08 NOTE — Progress Notes (Signed)
MAHYAR VINK - 62 y.o. male MRN [id number]  Date of birth: 09/05/1960  Office Visit Note: Visit Date: 12/08/2022 PCP: Hoy Register, MD Referred by: Hoy Register, MD  Subjective: Chief Complaint  Patient presents with   Neck - Pain   HPI:  IZMAEL BARDON is a 62 y.o. male who comes in today at the request of Ellin Goodie, FNP and Dr. Annell Greening for planned Right C7-T1 Cervical Interlaminar epidural steroid injection with fluoroscopic guidance.  The patient has failed conservative care including home exercise, medications, time and activity modification.  This injection will be diagnostic and hopefully therapeutic.  Please see requesting physician notes for further details and justification.  He did ask me for postprocedure pain medication as he has been getting oxycodone from a general surgeon more recently post surgical procedure.  We did inform him that we do not routinely do pain medication post injection as the injection itself is quite small.  If he has any postinjection pain tomorrow the next day that is severe he can start with Tylenol but should call us and we would look at medications at that point.  He cannot do ibuprofen due to to cardiovascular disease.   ROS Otherwise per HPI.  Assessment & Plan: Visit Diagnoses:    ICD-10-CM   1. Cervical radiculopathy  M54.12 XR C-ARM NO REPORT    Epidural Steroid injection    methylPREDNISolone acetate (DEPO-MEDROL) injection 80 mg      Plan: No additional findings.   Meds & Orders:  Meds ordered this encounter  Medications   methylPREDNISolone acetate (DEPO-MEDROL) injection 80 mg    Orders Placed This Encounter  Procedures   XR C-ARM NO REPORT   Epidural Steroid injection    Follow-up: Return for visit to requesting provider as needed.   Procedures: No procedures performed  Cervical Epidural Steroid Injection - Interlaminar Approach with Fluoroscopic Guidance  Patient: ABDULREHMAN INGLETT      Date of Birth:  02-Jan-1961 MRN: [id number] PCP: Hoy Register, MD      Visit Date: 12/08/2022   Universal Protocol:    Date/Time: 05/23/249:48 AM  Consent Given By: the patient  Position: PRONE  Additional Comments: Vital signs were monitored before and after the procedure. Patient was prepped and draped in the usual sterile fashion. The correct patient, procedure, and site was verified.   Injection Procedure Details:   Procedure diagnoses: Cervical radiculopathy [M54.12]    Meds Administered:  Meds ordered this encounter  Medications   methylPREDNISolone acetate (DEPO-MEDROL) injection 80 mg     Laterality: Right  Location/Site: C7-T1  Needle: 4.5 in., 20 ga. Tuohy  Needle Placement: Paramedian epidural space  Findings:  -Comments: Excellent flow of contrast into the epidural space.  Procedure Details: Using a paramedian approach from the side mentioned above, the region overlying the inferior lamina was localized under fluoroscopic visualization and the soft tissues overlying this structure were infiltrated with 4 ml. of 1% Lidocaine without Epinephrine. A # 20 gauge, Tuohy needle was inserted into the epidural space using a paramedian approach.  The epidural space was localized using loss of resistance along with contralateral oblique bi-planar fluoroscopic views.  After negative aspirate for air, blood, and CSF, a 2 ml. volume of Isovue-250 was injected into the epidural space and the flow of contrast was observed. Radiographs were obtained for documentation purposes.   The injectate was administered into the level noted above.  Additional Comments:  The patient tolerated the procedure well Dressing: 2  x 2 sterile gauze and Band-Aid    Post-procedure details: Patient was observed during the procedure. Post-procedure instructions were reviewed.  Patient left the clinic in stable condition.   Clinical History: CLINICAL DATA:  Chronic neck pain   EXAM: CT CERVICAL SPINE  WITHOUT CONTRAST   TECHNIQUE: Multidetector CT imaging of the cervical spine was performed without intravenous contrast. Multiplanar CT image reconstructions were also generated.   RADIATION DOSE REDUCTION: This exam was performed according to the departmental dose-optimization program which includes automated exposure control, adjustment of the mA and/or kV according to patient size and/or use of iterative reconstruction technique.   COMPARISON:  07/16/2022, 05/15/2022   FINDINGS: Alignment: Facet joints are aligned without dislocation or traumatic listhesis. Dens and lateral masses are aligned.   Skull base and vertebrae: No acute fracture. No primary bone lesion or focal pathologic process.   Soft tissues and spinal canal: No prevertebral fluid or swelling. No visible canal hematoma.   Disc levels: Degenerative disc disease most pronounced at the C3-4 and C5-6 levels, without interval progression from recent previous CT and MRI.   Upper chest: Negative.   Other: None.   IMPRESSION: 1. No acute fracture or traumatic listhesis of the cervical spine. 2. 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     Electronically Signed   By: Duanne Guess D.O.   On: 07/27/2022 14:03     Objective:  VS:  HT:    WT:   BMI:     BP:138/83  HR:72bpm  TEMP: ( )  RESP:  Physical Exam Vitals and nursing note reviewed.  Constitutional:      General: He is not in acute distress.    Appearance: Normal appearance. He is not ill-appearing.  HENT:     Head: Normocephalic and atraumatic.     Right Ear: External ear normal.     Left Ear: External ear normal.  Eyes:     Extraocular Movements: Extraocular movements intact.  Cardiovascular:     Rate and Rhythm: Normal rate.     Pulses: Normal pulses.  Abdominal:     General: There is no distension.     Palpations: Abdomen is soft.   Musculoskeletal:        General: No signs of injury.     Cervical back: Neck supple. Tenderness present. No rigidity.     Right lower leg: No edema.     Left lower leg: No edema.     Comments: Patient has good strength in the upper extremities with 5 out of 5 strength in wrist extension long finger flexion APB.  No intrinsic hand muscle atrophy.  Negative Hoffmann's test.  Lymphadenopathy:     Cervical: No cervical adenopathy.  Skin:    Findings: No erythema or rash.  Neurological:     General: No focal deficit present.     Mental Status: He is alert and oriented to person, place, and time.     Sensory: No sensory deficit.     Motor: No weakness or abnormal muscle tone.     Coordination: Coordination normal.  Psychiatric:        Mood and Affect: Mood normal.        Behavior: Behavior normal.      Imaging: No results found.

## 2022-12-08 NOTE — Patient Instructions (Signed)

## 2022-12-08 NOTE — Procedures (Signed)
Cervical Epidural Steroid Injection - Interlaminar Approach with Fluoroscopic Guidance  [name]      Date of Birth: 04-04-1961 MRN: [id number] PCP: Hoy Register, MD      Visit Date: 12/08/2022   Universal Protocol:    Date/Time: 05/23/249:48 AM  Consent Given By: the patient  Position: PRONE  Additional Comments: Vital signs were monitored before and after the procedure. Patient was prepped and draped in the usual sterile fashion. The correct patient, procedure, and site was verified.   Injection Procedure Details:   Procedure diagnoses: Cervical radiculopathy [M54.12]    Meds Administered:  Meds ordered this encounter  Medications   methylPREDNISolone acetate (DEPO-MEDROL) injection 80 mg     Laterality: Right  Location/Site: C7-T1  Needle: 4.5 in., 20 ga. Tuohy  Needle Placement: Paramedian epidural space  Findings:  -Comments: Excellent flow of contrast into the epidural space.  Procedure Details: Using a paramedian approach from the side mentioned above, the region overlying the inferior lamina was localized under fluoroscopic visualization and the soft tissues overlying this structure were infiltrated with 4 ml. of 1% Lidocaine without Epinephrine. A # 20 gauge, Tuohy needle was inserted into the epidural space using a paramedian approach.  The epidural space was localized using loss of resistance along with contralateral oblique bi-planar fluoroscopic views.  After negative aspirate for air, blood, and CSF, a 2 ml. volume of Isovue-250 was injected into the epidural space and the flow of contrast was observed. Radiographs were obtained for documentation purposes.   The injectate was administered into the level noted above.  Additional Comments:  The patient tolerated the procedure well Dressing: 2 x 2 sterile gauze and Band-Aid    Post-procedure details: Patient was observed during the procedure. Post-procedure instructions were  reviewed.  Patient left the clinic in stable condition.

## 2022-12-09 ENCOUNTER — Telehealth: Payer: Self-pay

## 2022-12-09 DIAGNOSIS — I480 Paroxysmal atrial fibrillation: Secondary | ICD-10-CM | POA: Diagnosis not present

## 2022-12-09 NOTE — Telephone Encounter (Signed)
-----   Message from Corrie Dandy sent at 12/08/2022 10:25 AM EDT ----- Patient would like to know when his surgery will be?

## 2022-12-09 NOTE — Telephone Encounter (Signed)
I called patient and left a voice mail for a return call to discuss scheduling surgery.

## 2022-12-09 NOTE — Progress Notes (Deleted)
Referring Physician:  Hoy Register, MD [address] [place] [address] [place],  [place] [address]  Primary Physician:  Hoy Register, MD  History of Present Illness: 12/09/2022*** Mr. Omran Nohr has a history of HTN, afib, stroke, migraine, asthma, GERD, bipolar, schizophrenia.   He was seen in ED on 07/27/22 for neck pain with numbness/tingling in his arms. Per that note, he sees Dr. Franky Macho for his neck. He had C7-T1 IL by Dr. Alvester Morin on 12/08/22.   He is here for evaluation of his lumbar spine.        Duration: *** Location: *** Quality: *** Severity: ***  Precipitating: aggravated by *** Modifying factors: made better by *** Weakness: none Timing: *** Bowel/Bladder Dysfunction: none  Conservative measures:  Physical therapy: ***  Multimodal medical therapy including regular antiinflammatories: ***  Injections: *** epidural steroid injections  Past Surgery: ***  [name] has ***no symptoms of cervical myelopathy.  The symptoms are causing a significant impact on the patient's life.   Review of Systems:  A 10 point review of systems is negative, except for the pertinent positives and negatives detailed in the HPI.  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)    a.) s/p embolization 08/19/2020: 29 mm RIGHT internal iliac artery aneurysm   Anxiety    Aortic atherosclerosis (HCC)    Bilateral carpal tunnel syndrome 01/10/2018   Bipolar disorder (HCC)    Chorioretinal inflammation of both eyes    a.) on azothioprine   Chronic lower back pain    Coronary artery calcification seen on CT scan    a.) cCTA 04/21/2022: Ca score 20.6 (61st percentile for age/sex match control)   DDD (degenerative disc disease), cervical    Depression    Diastolic dysfunction    a.) TTE 01/13/2021: EF 60-65%, mod LVH, triv MR, G1DD   GERD (gastroesophageal reflux disease)    Hepatic steatosis     History of alcohol abuse    History of nuclear stress test    Myoview 10/16: EF 50%, diaphragmatic attenuation, no ischemia, low risk   Hypertension    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   a.) CT head 12/25/2012 --> RIGHT basal ganglia hypoattenuation related to remote lacunar infarct   Lipoma    Long term (current) use of anticoagulants    a.) apixaban   Long-term current use of immunomodulator    a.) on azothioprine for peripheral focal chorioretinal inflammation (both eyes)   Marijuana use    Migraine    Moderate persistent asthma with acute exacerbation 05/02/2018   PAF (paroxysmal atrial fibrillation) (HCC) 03/27/2015   a.) CHA2DS2VASc = 5 (HTN, CVA x2, vascular disease history, T2DM);  b.) s/p ablation 10/02/2015; c.) s/p ablation 12/10/2015; d.) s/p DCCV (200 J x 1) 12/11/2015; e.) s/p ablation 04/28/2022; f.) rate/rhythm maintained on oral diltiazem + carvedilol; chronically anticoagulated with apixaban   Pilonidal cyst    Schizophrenia (HCC)    Sciatica neuralgia    T2DM (type 2 diabetes mellitus) (HCC)    Thoracic aortic ectasia (HCC) 01/13/2021   a.) TTE 01/13/2021: Ao root 38 mm, asc Ao 39 mm    Past Surgical History: Past Surgical History:  Procedure Laterality Date   ATRIAL FIBRILLATION ABLATION  09/22/2015   ATRIAL FIBRILLATION ABLATION N/A 04/28/2022   Procedure: ATRIAL FIBRILLATION ABLATION;  Surgeon: Regan Lemming, MD;  Location: [facility];  Service: Cardiovascular;  Laterality: N/A;  CATARACT EXTRACTION Bilateral    CYST EXCISION  1996-97   surgery back of head    ELECTROPHYSIOLOGIC STUDY N/A 09/22/2015   Procedure: Atrial Fibrillation Ablation;  Surgeon: Will Jorja Loa, MD;  Location: [facility];  Service: Cardiovascular;  Laterality: N/A;   ELECTROPHYSIOLOGIC STUDY N/A 12/10/2015   Procedure: Atrial Fibrillation Ablation;  Surgeon: Will Jorja Loa, MD;  Location: [facility];  Service: Cardiovascular;  Laterality: N/A;    ELECTROPHYSIOLOGIC STUDY N/A 12/11/2015   Procedure: Cardioversion;  Surgeon: Will Jorja Loa, MD;  Location: [facility];  Service: Cardiovascular;  Laterality: N/A;   EMBOLIZATION Right 08/19/2020   Procedure: EMBOLIZATION;  Surgeon: Leonie Douglas, MD;  Location: [facility];  Service: Cardiovascular;  Laterality: Right;  hypogastric   EXCISION MASS HEAD N/A 01/06/2017   Procedure: EXCISION MASS FOREHEAD;  Surgeon: Glenna Fellows, MD;  Location: [facility];  Service: Plastics;  Laterality: N/A;   EXCISION MASS UPPER EXTREMETIES Right 08/08/2022   Procedure: EXCISION MASS RIGHT FOREARM;  Surgeon: Betha Loa, MD;  Location: [facility];  Service: Orthopedics;  Laterality: Right;  45 MIN   GANGLION CYST EXCISION Left    INTERCOSTAL NERVE BLOCK  07/19/2003   KNEE ARTHROSCOPY Right 07/18/2014   MASS EXCISION N/A 01/25/2021   Procedure: EXCISION SUBCUTANEOUS VS SUBFASCIAL MASS TORSO 3CM;  Surgeon: Glenna Fellows, MD;  Location: [facility];  Service: Plastics;  Laterality: N/A;   PILONIDAL CYST EXCISION N/A 09/13/2022   Procedure: CYST EXCISION PILONIDAL EXTENSIVE;  Surgeon: Henrene Dodge, MD;  Location: [facility];  Service: General;  Laterality: N/A;    Allergies: Allergies as of 12/14/2022 - Review Complete 12/05/2022  Allergen Reaction Noted   Lisinopril Anaphylaxis 10/12/2012   Tylenol [acetaminophen] Other (See Comments) 04/28/2022   Lyrica [pregabalin] Palpitations 09/28/2022    Medications: Outpatient Encounter Medications as of 12/14/2022  Medication Sig   albuterol (VENTOLIN HFA) 108 (90 Base) MCG/ACT inhaler Inhale 2 puffs into the lungs every 4 (four) hours as needed for wheezing or shortness of breath.   amoxicillin-clavulanate (AUGMENTIN) 875-125 MG tablet Take 1 tablet by mouth in the morning for 10 days.   apixaban (ELIQUIS) 5 MG TABS tablet Take 1 tablet (5 mg total) by mouth 2 (two) times daily.    atorvastatin (LIPITOR) 40 MG tablet Take 1 tablet (40 mg total) by mouth daily. (Patient taking differently: Take 40 mg by mouth every morning.)   azaTHIOprine (IMURAN) 50 MG tablet Take 100 mg by mouth every morning.   azelastine (ASTELIN) 0.1 % nasal spray Place 1 spray into both nostrils 2 (two) times daily. 1 spray each nostril twice a day   brimonidine (ALPHAGAN) 0.2 % ophthalmic solution Place 1 drop into both eyes every 8 (eight) hours.   carvedilol (COREG) 25 MG tablet Take 1 tablet (25 mg total) by mouth 2 (two) times daily.   cetirizine (ZYRTEC) 10 MG tablet Take 1 tablet (10 mg total) by mouth daily as needed for allergies (Can take an extra dose during flare ups.).   Difluprednate 0.05 % EMUL Place 1 drop into both eyes 2 (two) times daily.   diltiazem (CARDIZEM CD) 180 MG 24 hr capsule TAKE 1 CAPSULE BY MOUTH EVERY DAY (Patient taking differently: 180 mg every morning. TAKE 1 CAPSULE BY MOUTH EVERY DAY)   dorzolamide-timolol (COSOPT) 22.3-6.8 MG/ML ophthalmic solution Place 1 drop into both eyes 2 (two) times daily.   DULoxetine (CYMBALTA) 60 MG capsule Take  1 capsule (60 mg total) by mouth daily. For chronic pain and neuropathy   EPINEPHrine 0.3 mg/0.3 mL IJ SOAJ injection Inject 0.3 mg into the muscle as needed for anaphylaxis.   fluticasone (FLONASE) 50 MCG/ACT nasal spray 1 spray each nostril twice a day (Patient taking differently: Place 1 spray into both nostrils as needed. 1 spray each nostril twice a day)   fluticasone furoate-vilanterol (BREO ELLIPTA) 200-25 MCG/ACT AEPB Inhale 1 puff into the lungs daily.   folic acid (FOLVITE) 1 MG tablet Take 1 mg by mouth daily.   furosemide (LASIX) 20 MG tablet Take 1 tablet (20 mg total) by mouth daily.   GARLIC PO Take 1 tablet by mouth daily.   glucose blood (ACCU-CHEK GUIDE) test strip Use to check blood sugar once daily. E11.69   latanoprost (XALATAN) 0.005 % ophthalmic solution Place 1 drop into both eyes daily.   metFORMIN  (GLUCOPHAGE) 500 MG tablet Take 1 tablet (500 mg total) by mouth 2 (two) times daily with a meal. For 1 week then increase to 2 tablets twice a day   methocarbamol (ROBAXIN) 500 MG tablet TAKE 1 TABLET BY MOUTH EVERY 8 HOURS AS NEEDED FOR MUSCLE SPASM   Misc. Devices MISC Back brace. Diagnosis chronic back pain   Misc. Devices MISC Left knee brace. Diagnosis L knee pain   montelukast (SINGULAIR) 10 MG tablet Take 1 tablet (10 mg total) by mouth at bedtime.   Olopatadine HCl (PATADAY) 0.2 % SOLN Place 1 drop into both eyes daily as needed.   omeprazole (PRILOSEC) 20 MG capsule TAKE 1 CAPSULE BY MOUTH EVERY DAY (Patient taking differently: 20 mg every morning. TAKE 1 CAPSULE BY MOUTH EVERY DAY)   oxyCODONE 10 MG TABS Take 1 tablet (10 mg total) by mouth every 4 (four) hours as needed for severe pain.   Vitamin D3 (VITAMIN D) 25 MCG tablet Take 1,000 Units by mouth daily.   No facility-administered encounter medications on file as of 12/14/2022.    Social History: Social History   Tobacco Use   Smoking status: Former    Years: 29    Types: Cigarettes    Quit date: 09/10/2022    Years since quitting: 0.2    Passive exposure: Never   Smokeless tobacco: Never   Tobacco comments:    1 cigarettes every day  06-02-2021  Vaping Use   Vaping Use: Never used  Substance Use Topics   Alcohol use: Yes    Alcohol/week: 6.0 standard drinks of alcohol    Types: 6 Cans of beer per week    Comment: 6 pack weekly 05/26/22   Drug use: Yes    Types: Marijuana    Comment: sunday was last time    Family Medical History: Family History  Problem Relation Age of Onset   Heart disease Father    Schizophrenia Sister     Physical Examination: There were no vitals filed for this visit.  General: Patient is well developed, well nourished, calm, collected, and in no apparent distress. Attention to examination is appropriate.  Respiratory: Patient is breathing without any difficulty.   NEUROLOGICAL:      Awake, alert, oriented to person, place, and time.  Speech is clear and fluent. Fund of knowledge is appropriate.   Cranial Nerves: Pupils equal round and reactive to light.  Facial tone is symmetric.    *** ROM of cervical spine *** pain *** posterior cervical tenderness. *** tenderness in bilateral trapezial region.   *** ROM of  lumbar spine *** pain *** posterior lumbar tenderness.   No abnormal lesions on exposed skin.   Strength: Side Biceps Triceps Deltoid Interossei Grip Wrist Ext. Wrist Flex.  R 5 5 5 5 5 5 5   L 5 5 5 5 5 5 5    Side Iliopsoas Quads Hamstring PF DF EHL  R 5 5 5 5 5 5   L 5 5 5 5 5 5    Reflexes are ***2+ and symmetric at the biceps, triceps, brachioradialis, patella and achilles.   Hoffman's is absent.  Clonus is not present.   Bilateral upper and lower extremity sensation is intact to light touch.     Gait is normal.   ***No difficulty with tandem gait.    Medical Decision Making  Imaging: No lumbar imaging.   Assessment and Plan: Mr. Lingren is a pleasant 62 y.o. male has ***  Treatment options discussed with patient and following plan made:   - Order for physical therapy for *** spine ***. Patient to call to schedule appointment. *** - Continue current medications including ***. Reviewed dosing and side effects.  - Prescription for ***. Reviewed dosing and side effects. Take with food.  - Prescription for *** to take prn muscle spasms. Reviewed dosing and side effects. Discussed this can cause drowsiness.  - MRI of *** to further evaluate *** radiculopathy. No improvement time or medications (***).  - Referral to PMR at [facility] to discuss possible *** injections.  - Will schedule phone visit to review MRI results once I get them back.   I spent a total of *** minutes in face-to-face and non-face-to-face activities related to this patient's care today including review of outside records, review of imaging, review of symptoms, physical exam,  discussion of differential diagnosis, discussion of treatment options, and documentation.   Thank you for involving me in the care of this patient.   Drake Leach PA-C Dept. of Neurosurgery

## 2022-12-10 DIAGNOSIS — I480 Paroxysmal atrial fibrillation: Secondary | ICD-10-CM | POA: Diagnosis not present

## 2022-12-11 DIAGNOSIS — I480 Paroxysmal atrial fibrillation: Secondary | ICD-10-CM | POA: Diagnosis not present

## 2022-12-12 DIAGNOSIS — I480 Paroxysmal atrial fibrillation: Secondary | ICD-10-CM | POA: Diagnosis not present

## 2022-12-13 DIAGNOSIS — I480 Paroxysmal atrial fibrillation: Secondary | ICD-10-CM | POA: Diagnosis not present

## 2022-12-14 ENCOUNTER — Ambulatory Visit: Payer: Medicaid Other | Admitting: Orthopedic Surgery

## 2022-12-14 DIAGNOSIS — I480 Paroxysmal atrial fibrillation: Secondary | ICD-10-CM | POA: Diagnosis not present

## 2022-12-15 ENCOUNTER — Telehealth: Payer: Self-pay | Admitting: Allergy and Immunology

## 2022-12-15 ENCOUNTER — Ambulatory Visit: Payer: Medicaid Other | Admitting: Orthopedic Surgery

## 2022-12-15 ENCOUNTER — Encounter: Payer: Self-pay | Admitting: Orthopedic Surgery

## 2022-12-15 ENCOUNTER — Ambulatory Visit (INDEPENDENT_AMBULATORY_CARE_PROVIDER_SITE_OTHER): Payer: Medicaid Other

## 2022-12-15 VITALS — BP 136/82 | Ht 72.0 in | Wt 246.2 lb

## 2022-12-15 DIAGNOSIS — I480 Paroxysmal atrial fibrillation: Secondary | ICD-10-CM | POA: Diagnosis not present

## 2022-12-15 DIAGNOSIS — M48061 Spinal stenosis, lumbar region without neurogenic claudication: Secondary | ICD-10-CM | POA: Diagnosis not present

## 2022-12-15 DIAGNOSIS — M5416 Radiculopathy, lumbar region: Secondary | ICD-10-CM

## 2022-12-15 DIAGNOSIS — M47816 Spondylosis without myelopathy or radiculopathy, lumbar region: Secondary | ICD-10-CM

## 2022-12-15 DIAGNOSIS — M51369 Other intervertebral disc degeneration, lumbar region without mention of lumbar back pain or lower extremity pain: Secondary | ICD-10-CM

## 2022-12-15 DIAGNOSIS — M5116 Intervertebral disc disorders with radiculopathy, lumbar region: Secondary | ICD-10-CM | POA: Diagnosis not present

## 2022-12-15 DIAGNOSIS — J309 Allergic rhinitis, unspecified: Secondary | ICD-10-CM | POA: Diagnosis not present

## 2022-12-15 DIAGNOSIS — M5136 Other intervertebral disc degeneration, lumbar region: Secondary | ICD-10-CM

## 2022-12-15 NOTE — Patient Instructions (Signed)
It was so nice to see you today. Thank you so much for coming in.    Your images from 2022 show some wear and tear in your back. I want to get an updated MRI of your lower back to look into things further. We will get this approved through your insurance and [facility] will call you to schedule the appointment.   We will work on getting records from [facility].   Once I have the MRI results back, we will call you to schedule a follow up with me to review them. Please do not hesitate to call if you have any questions or concerns. You can also message me in MyChart.   If you have not heard back about any of the MRI in the next week, please call the office so we can help you get it scheduled.   Drake Leach PA-C [phone]

## 2022-12-15 NOTE — Progress Notes (Addendum)
Referring Physician:  Hoy Register, MD [address] [place],  [place] [address]  Primary Physician:  Hoy Register, MD  History of Present Illness: 12/15/2022 Jason Stokes has a history of HTN, afib, stroke, migraine, asthma, GERD, bipolar, schizophrenia.   He was seen in ED on 07/27/22 for neck pain with numbness/tingling in his arms. Per that note, he sees Dr. Franky Macho for his neck. He had C7-T1 IL by Dr. Alvester Morin on 12/08/22.   He is here for evaluation of his lumbar spine.   He has constant LBP with constant right lateral leg pain to his foot with numbness in the leg x 20+ years. No left leg pain. He has numbness, tingling, and weakness in right leg. He needs a TKA in right knee as well. Pain is worse with standing, bending, and prolonged sitting.   He is on ELIQUIS. He takes robaxin. His last lumbar injection at [facility] helped for over a year.   He smokes occasionally.   Bowel/Bladder Dysfunction: none  Conservative measures:  Physical therapy: did PT for his back 2 years ago with relief Multimodal medical therapy including regular antiinflammatories: robaxin, oxycodone  Injections: He's had 2 lumbar injections in last 1-2 years ago with some improvement.   Past Surgery: no spinal surgery  Jason Stokes has no symptoms of cervical myelopathy.  The symptoms are causing a significant impact on the patient's life.   Review of Systems:  A 10 point review of systems is negative, except for the pertinent positives and negatives detailed in the HPI.  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)    a.) s/p embolization 08/19/2020: 29 mm RIGHT internal iliac artery aneurysm   Anxiety    Aortic atherosclerosis (HCC)    Bilateral carpal tunnel syndrome 01/10/2018   Bipolar disorder (HCC)    Chorioretinal inflammation of both eyes    a.) on azothioprine   Chronic  lower back pain    Coronary artery calcification seen on CT scan    a.) cCTA 04/21/2022: Ca score 20.6 (61st percentile for age/sex match control)   DDD (degenerative disc disease), cervical    Depression    Diastolic dysfunction    a.) TTE 01/13/2021: EF 60-65%, mod LVH, triv MR, G1DD   GERD (gastroesophageal reflux disease)    Hepatic steatosis    History of alcohol abuse    History of nuclear stress test    Myoview 10/16: EF 50%, diaphragmatic attenuation, no ischemia, low risk   Hypertension    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   a.) CT head 12/25/2012 --> RIGHT basal ganglia hypoattenuation related to remote lacunar infarct   Lipoma    Long term (current) use of anticoagulants    a.) apixaban   Long-term current use of immunomodulator    a.) on azothioprine for peripheral focal chorioretinal inflammation (both eyes)   Marijuana use    Migraine    Moderate persistent asthma with acute exacerbation 05/02/2018   PAF (paroxysmal atrial fibrillation) (HCC) 03/27/2015   a.) CHA2DS2VASc = 5 (HTN, CVA x2, vascular disease history, T2DM);  b.) s/p ablation 10/02/2015; c.) s/p ablation 12/10/2015; d.) s/p DCCV (200 J x 1) 12/11/2015; e.) s/p ablation 04/28/2022; f.) rate/rhythm maintained on oral diltiazem + carvedilol; chronically anticoagulated with apixaban   Pilonidal cyst    Schizophrenia (HCC)    Sciatica neuralgia    T2DM (type 2 diabetes mellitus) (  [facility])    Thoracic aortic ectasia (HCC) 01/13/2021   a.) TTE 01/13/2021: Ao root 38 mm, asc Ao 39 mm    Past Surgical History: Past Surgical History:  Procedure Laterality Date   ATRIAL FIBRILLATION ABLATION  09/22/2015   ATRIAL FIBRILLATION ABLATION N/A 04/28/2022   Procedure: ATRIAL FIBRILLATION ABLATION;  Surgeon: Regan Lemming, MD;  Location: [facility];  Service: Cardiovascular;  Laterality: N/A;   CATARACT EXTRACTION Bilateral    CYST EXCISION  1996-97   surgery back of head    ELECTROPHYSIOLOGIC STUDY N/A  09/22/2015   Procedure: Atrial Fibrillation Ablation;  Surgeon: Will Jorja Loa, MD;  Location: [facility];  Service: Cardiovascular;  Laterality: N/A;   ELECTROPHYSIOLOGIC STUDY N/A 12/10/2015   Procedure: Atrial Fibrillation Ablation;  Surgeon: Will Jorja Loa, MD;  Location: [facility];  Service: Cardiovascular;  Laterality: N/A;   ELECTROPHYSIOLOGIC STUDY N/A 12/11/2015   Procedure: Cardioversion;  Surgeon: Will Jorja Loa, MD;  Location: [facility];  Service: Cardiovascular;  Laterality: N/A;   EMBOLIZATION Right 08/19/2020   Procedure: EMBOLIZATION;  Surgeon: Leonie Douglas, MD;  Location: [facility];  Service: Cardiovascular;  Laterality: Right;  hypogastric   EXCISION MASS HEAD N/A 01/06/2017   Procedure: EXCISION MASS FOREHEAD;  Surgeon: Glenna Fellows, MD;  Location: [facility];  Service: Plastics;  Laterality: N/A;   EXCISION MASS UPPER EXTREMETIES Right 08/08/2022   Procedure: EXCISION MASS RIGHT FOREARM;  Surgeon: Betha Loa, MD;  Location: [facility];  Service: Orthopedics;  Laterality: Right;  45 MIN   GANGLION CYST EXCISION Left    INTERCOSTAL NERVE BLOCK  07/19/2003   KNEE ARTHROSCOPY Right 07/18/2014   MASS EXCISION N/A 01/25/2021   Procedure: EXCISION SUBCUTANEOUS VS SUBFASCIAL MASS TORSO 3CM;  Surgeon: Glenna Fellows, MD;  Location:  SURGERY CENTER;  Service: Plastics;  Laterality: N/A;   PILONIDAL CYST EXCISION N/A 09/13/2022   Procedure: CYST EXCISION PILONIDAL EXTENSIVE;  Surgeon: Henrene Dodge, MD;  Location: [facility];  Service: General;  Laterality: N/A;    Allergies: Allergies as of 12/15/2022 - Review Complete 12/15/2022  Allergen Reaction Noted   Lisinopril Anaphylaxis 10/12/2012   Tylenol [acetaminophen] Other (See Comments) 04/28/2022   Lyrica [pregabalin] Palpitations 09/28/2022    Medications: Outpatient Encounter Medications as of 12/15/2022  Medication Sig    albuterol (VENTOLIN HFA) 108 (90 Base) MCG/ACT inhaler Inhale 2 puffs into the lungs every 4 (four) hours as needed for wheezing or shortness of breath.   apixaban (ELIQUIS) 5 MG TABS tablet Take 1 tablet (5 mg total) by mouth 2 (two) times daily.   atorvastatin (LIPITOR) 40 MG tablet Take 1 tablet (40 mg total) by mouth daily. (Patient taking differently: Take 40 mg by mouth every morning.)   azaTHIOprine (IMURAN) 50 MG tablet Take 100 mg by mouth every morning.   azelastine (ASTELIN) 0.1 % nasal spray Place 1 spray into both nostrils 2 (two) times daily. 1 spray each nostril twice a day   brimonidine (ALPHAGAN) 0.2 % ophthalmic solution Place 1 drop into both eyes every 8 (eight) hours.   carvedilol (COREG) 25 MG tablet Take 1 tablet (25 mg total) by mouth 2 (two) times daily.   cetirizine (ZYRTEC) 10 MG tablet Take 1 tablet (10 mg total) by mouth daily as needed for allergies (Can take an extra dose during flare ups.).   Difluprednate 0.05 % EMUL Place 1 drop into both eyes 2 (two) times daily.  diltiazem (CARDIZEM CD) 180 MG 24 hr capsule TAKE 1 CAPSULE BY MOUTH EVERY DAY (Patient taking differently: 180 mg every morning. TAKE 1 CAPSULE BY MOUTH EVERY DAY)   dorzolamide-timolol (COSOPT) 22.3-6.8 MG/ML ophthalmic solution Place 1 drop into both eyes 2 (two) times daily.   DULoxetine (CYMBALTA) 60 MG capsule Take 1 capsule (60 mg total) by mouth daily. For chronic pain and neuropathy   EPINEPHrine 0.3 mg/0.3 mL IJ SOAJ injection Inject 0.3 mg into the muscle as needed for anaphylaxis.   fluticasone (FLONASE) 50 MCG/ACT nasal spray 1 spray each nostril twice a day (Patient taking differently: Place 1 spray into both nostrils as needed. 1 spray each nostril twice a day)   fluticasone furoate-vilanterol (BREO ELLIPTA) 200-25 MCG/ACT AEPB Inhale 1 puff into the lungs daily.   folic acid (FOLVITE) 1 MG tablet Take 1 mg by mouth daily.   furosemide (LASIX) 20 MG tablet Take 1 tablet (20 mg total) by  mouth daily.   GARLIC PO Take 1 tablet by mouth daily.   glucose blood (ACCU-CHEK GUIDE) test strip Use to check blood sugar once daily. E11.69   latanoprost (XALATAN) 0.005 % ophthalmic solution Place 1 drop into both eyes daily.   metFORMIN (GLUCOPHAGE) 500 MG tablet Take 1 tablet (500 mg total) by mouth 2 (two) times daily with a meal. For 1 week then increase to 2 tablets twice a day   methocarbamol (ROBAXIN) 500 MG tablet TAKE 1 TABLET BY MOUTH EVERY 8 HOURS AS NEEDED FOR MUSCLE SPASM   Misc. Devices MISC Back brace. Diagnosis chronic back pain   Misc. Devices MISC Left knee brace. Diagnosis L knee pain   montelukast (SINGULAIR) 10 MG tablet Take 1 tablet (10 mg total) by mouth at bedtime.   Olopatadine HCl (PATADAY) 0.2 % SOLN Place 1 drop into both eyes daily as needed.   omeprazole (PRILOSEC) 20 MG capsule TAKE 1 CAPSULE BY MOUTH EVERY DAY (Patient taking differently: 20 mg every morning. TAKE 1 CAPSULE BY MOUTH EVERY DAY)   oxyCODONE 10 MG TABS Take 1 tablet (10 mg total) by mouth every 4 (four) hours as needed for severe pain.   Vitamin D3 (VITAMIN D) 25 MCG tablet Take 1,000 Units by mouth daily.   No facility-administered encounter medications on file as of 12/15/2022.    Social History: Social History   Tobacco Use   Smoking status: Some Days    Years: 29    Types: Cigarettes    Last attempt to quit: 09/10/2022    Years since quitting: 0.2    Passive exposure: Never   Smokeless tobacco: Never   Tobacco comments:    1 cigarettes every day  06-02-2021  Vaping Use   Vaping Use: Never used  Substance Use Topics   Alcohol use: Yes    Alcohol/week: 6.0 standard drinks of alcohol    Types: 6 Cans of beer per week    Comment: 6 pack weekly 05/26/22   Drug use: Yes    Types: Marijuana    Comment: sunday was last time    Family Medical History: Family History  Problem Relation Age of Onset   Heart disease Father    Schizophrenia Sister     Physical  Examination: Vitals:   12/15/22 1003  BP: 136/82    General: Patient is well developed, well nourished, calm, collected, and in no apparent distress. Attention to examination is appropriate.  Respiratory: Patient is breathing without any difficulty.   NEUROLOGICAL:  Awake, alert, oriented to person, place, and time.  Speech is clear and fluent. Fund of knowledge is appropriate.   Cranial Nerves: Pupils equal round and reactive to light.  Facial tone is symmetric.    He has diffuse posterior lower lumbar tenderness.   No abnormal lesions on exposed skin.   Strength: Side Biceps Triceps Deltoid Interossei Grip Wrist Ext. Wrist Flex.  R 5 5 5 5 5 5 5   L 5 5 5 5 5 5 5    Side Iliopsoas Quads Hamstring PF DF EHL  R 5 5 5 5 5 5   L 5 5 5 5 5 5    Reflexes are 2+ and symmetric at the biceps, triceps, brachioradialis, patella and achilles.   Hoffman's is absent.  Clonus is not present.   Bilateral upper and lower extremity sensation is intact to light touch.     He ambulates with a cane.   Medical Decision Making  Imaging: Lumbar xrays dated 12/06/20: FINDINGS: Five lumbar type vertebral segments. Vertebral body heights and alignment are maintained. No fracture identified. Intervertebral disc spaces are relatively preserved. Minimal degenerative endplate changes. Mild lower lumbar facet arthrosis. Vascular coils present within the right pelvis.   IMPRESSION: Negative.     Electronically Signed   By: Duanne Guess D.O.   On: 12/06/2020 13:30   CT myelogram of lumbar spine dated 11/30/20:  IMPRESSION: 1. Right eccentric disc bulging or shallow protrusion at L4-5 which contacts on the right descending L5 nerve root, more prominent on the oblique myelographic images. 2. L3-4 degenerative mild to moderate spinal stenosis which accentuates with standing. 3. Patent foramina     Electronically Signed   By: Marnee Spring M.D.   On: 11/30/2020 10:04    I have  personally reviewed the images and agree with the above interpretation.    Assessment and Plan: Mr. Signer is a pleasant 62 y.o. male has constant LBP with constant right lateral leg pain to his foot with numbness in the leg x 20+ years. No left leg pain. He has numbness, tingling, and weakness in right leg.   Imaging from 2022 shows right sided disc at L4-L5 with mild stenosis at L3-L4. Previous ESI at [facility] helped for over a year and a half.   Treatment options discussed with patient and following plan made:   - MRI of lumbar spine to further evaluate lumbar radiculopathy.  - ROI signed to get records from [facility].  - Depending on results of MRI, may consider repeating the last lumbar injection he had done and/or revisiting lumbar PT.  - He will follow up with me after MRI is done to review the results.   I spent a total of 25 minutes in face-to-face and non-face-to-face activities related to this patient's care today including review of outside records, review of imaging, review of symptoms, physical exam, discussion of differential diagnosis, discussion of treatment options, and documentation.   ADDENDUM 01/05/23:  He had right L5-S1 interlaminar ESI 06/01/21 and 12/29/20.   Thank you for involving me in the care of this patient.   Drake Leach PA-C Dept. of Neurosurgery

## 2022-12-15 NOTE — Telephone Encounter (Signed)
Jason Stokes came into the office and stated that at his last visit Dr. Lucie Leather suggested he take Mucinex and he states it is not working and it is not taking the tickle out of his throat.  He is wanting to know if something could be prescribed that is a little stronger?  He uses pharmacy [facility] on [address].

## 2022-12-16 ENCOUNTER — Other Ambulatory Visit: Payer: Self-pay

## 2022-12-16 DIAGNOSIS — I480 Paroxysmal atrial fibrillation: Secondary | ICD-10-CM | POA: Diagnosis not present

## 2022-12-16 MED ORDER — IPRATROPIUM BROMIDE 0.03 % NA SOLN: 2.0000 | Freq: Four times a day (QID) | NASAL | 5 refills | Status: DC | PRN

## 2022-12-16 NOTE — Telephone Encounter (Signed)
Spoke with pt informed him of atrovent and he stated understanding and sent in med verified pharmacy

## 2022-12-16 NOTE — Telephone Encounter (Signed)
Patient called back to see if there was an update on his message, he said the tickle in his throat is causing him to vomit and he would like some relief.

## 2022-12-16 NOTE — Telephone Encounter (Signed)
Lm for pt to call us back and also sent a mychart message

## 2022-12-16 NOTE — Telephone Encounter (Signed)
Recommend adding nasal Atrovent to help with postnasal drip.  He can take it up to 4 times a day as needed.  He should take his nasal Astelin and nasal Flonase twice a day and do sinus rinses prior to nasal sprays.  If this still does not work we may need to consider a sinus CT to evaluate for chronic sinusitis.

## 2022-12-16 NOTE — Telephone Encounter (Signed)
Patient called back and I relayed the message that was left by [facility]. Patient verbalized understanding but not satisfied with the outcome.

## 2022-12-17 DIAGNOSIS — I480 Paroxysmal atrial fibrillation: Secondary | ICD-10-CM | POA: Diagnosis not present

## 2022-12-18 DIAGNOSIS — I480 Paroxysmal atrial fibrillation: Secondary | ICD-10-CM | POA: Diagnosis not present

## 2022-12-19 DIAGNOSIS — I480 Paroxysmal atrial fibrillation: Secondary | ICD-10-CM | POA: Diagnosis not present

## 2022-12-20 DIAGNOSIS — I480 Paroxysmal atrial fibrillation: Secondary | ICD-10-CM | POA: Diagnosis not present

## 2022-12-21 DIAGNOSIS — I480 Paroxysmal atrial fibrillation: Secondary | ICD-10-CM | POA: Diagnosis not present

## 2022-12-22 DIAGNOSIS — I480 Paroxysmal atrial fibrillation: Secondary | ICD-10-CM | POA: Diagnosis not present

## 2022-12-23 ENCOUNTER — Ambulatory Visit: Payer: Medicaid Other | Admitting: Physical Therapy

## 2022-12-23 ENCOUNTER — Telehealth: Payer: Self-pay | Admitting: Radiology

## 2022-12-23 DIAGNOSIS — I480 Paroxysmal atrial fibrillation: Secondary | ICD-10-CM | POA: Diagnosis not present

## 2022-12-23 NOTE — Therapy (Deleted)
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)    a.) s/p embolization 08/19/2020: 29 mm RIGHT internal iliac artery aneurysm   Anxiety    Aortic atherosclerosis (HCC)    Bilateral carpal tunnel syndrome 01/10/2018   Bipolar disorder (HCC)    Chorioretinal inflammation of both eyes    a.) on azothioprine   Chronic lower back pain    Coronary artery calcification seen on CT scan    a.) cCTA 04/21/2022: Ca score 20.6 (61st percentile for age/sex match control)   DDD (degenerative disc disease), cervical    Depression    Diastolic dysfunction    a.) TTE 01/13/2021: EF 60-65%, mod LVH, triv MR, G1DD   GERD (gastroesophageal reflux disease)    Hepatic steatosis    History of alcohol abuse    History of nuclear stress test    Myoview 10/16: EF 50%, diaphragmatic attenuation, no ischemia, low risk   Hypertension    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   a.) CT head 12/25/2012 --> RIGHT basal ganglia hypoattenuation related to remote lacunar infarct   Lipoma    Long term (current) use of anticoagulants    a.) apixaban   Long-term current use of immunomodulator    a.) on azothioprine for peripheral focal chorioretinal inflammation (both eyes)   Marijuana use    Migraine    Moderate persistent asthma with acute exacerbation 05/02/2018   PAF (paroxysmal atrial fibrillation) (HCC) 03/27/2015   a.) CHA2DS2VASc = 5 (HTN, CVA x2, vascular disease history, T2DM);  b.) s/p ablation 10/02/2015; c.) s/p ablation 12/10/2015; d.) s/p DCCV (200 J x 1) 12/11/2015; e.) s/p ablation 04/28/2022; f.) rate/rhythm maintained on oral diltiazem + carvedilol; chronically anticoagulated with apixaban   Pilonidal cyst    Schizophrenia (HCC)    Sciatica neuralgia    T2DM (type 2 diabetes  mellitus) (HCC)    Thoracic aortic ectasia (HCC) 01/13/2021   a.) TTE 01/13/2021: Ao root 38 mm, asc Ao 39 mm   Past Surgical History:  Procedure Laterality Date   ATRIAL FIBRILLATION ABLATION  09/22/2015   ATRIAL FIBRILLATION ABLATION N/A 04/28/2022   Procedure: ATRIAL FIBRILLATION ABLATION;  Surgeon: Regan Lemming, MD;  Location: [facility];  Service: Cardiovascular;  Laterality: N/A;   CATARACT EXTRACTION Bilateral    CYST EXCISION  1996-97   surgery back of head    ELECTROPHYSIOLOGIC STUDY N/A 09/22/2015   Procedure: Atrial Fibrillation Ablation;  Surgeon: Will Jorja Loa, MD;  Location: [facility];  Service: Cardiovascular;  Laterality: N/A;   ELECTROPHYSIOLOGIC STUDY N/A 12/10/2015   Procedure: Atrial Fibrillation Ablation;  Surgeon: Will Jorja Loa, MD;  Location: [facility];  Service: Cardiovascular;  Laterality: N/A;   ELECTROPHYSIOLOGIC STUDY N/A 12/11/2015   Procedure: Cardioversion;  Surgeon: Will Jorja Loa, MD;  Location: [facility];  Service: Cardiovascular;  Laterality: N/A;   EMBOLIZATION Right 08/19/2020   Procedure: EMBOLIZATION;  Surgeon: Leonie Douglas, MD;  Location: [facility];  Service: Cardiovascular;  Laterality: Right;  hypogastric   EXCISION MASS HEAD N/A 01/06/2017   Procedure: EXCISION MASS FOREHEAD;  Surgeon: Glenna Fellows, MD;  Location: [facility];  Service: Plastics;  Laterality: N/A;   EXCISION MASS UPPER EXTREMETIES Right 08/08/2022   Procedure: EXCISION  MASS RIGHT FOREARM;  Surgeon: Betha Loa, MD;  Location: [facility];  Service: Orthopedics;  Laterality: Right;  45 MIN   GANGLION CYST EXCISION Left    INTERCOSTAL NERVE BLOCK  07/19/2003   KNEE ARTHROSCOPY Right 07/18/2014   MASS EXCISION N/A 01/25/2021   Procedure: EXCISION SUBCUTANEOUS VS SUBFASCIAL MASS TORSO 3CM;  Surgeon: Glenna Fellows, MD;  Location: [facility];  Service: Plastics;   Laterality: N/A;   PILONIDAL CYST EXCISION N/A 09/13/2022   Procedure: CYST EXCISION PILONIDAL EXTENSIVE;  Surgeon: Henrene Dodge, MD;  Location: [facility];  Service: General;  Laterality: N/A;   Patient Active Problem List   Diagnosis Date Noted   Complete tear of left rotator cuff 10/28/2022   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   Pilonidal cyst 09/13/2022   Aortic atherosclerosis (HCC) 06/07/2022   Hypercoagulable state due to paroxysmal atrial fibrillation (HCC) 03/23/2021   Type 2 diabetes mellitus with hyperglycemia, without long-term current use of insulin (HCC) 02/10/2021   Neck pain on left side 03/06/2019   Musculoskeletal chest pain 07/24/2018   Asthma, mild intermittent 05/02/2018   Allergic rhinitis caused by mold 05/02/2018   Tobacco use 05/02/2018   Bilateral carpal tunnel syndrome 01/10/2018   Stroke (HCC)    Schizophrenia (HCC)    Migraine    History of nuclear stress test    History of alcohol abuse    GERD (gastroesophageal reflux disease)    Dysrhythmia    Chronic lower back pain    Anxiety    Arthritis of knee, right 04/12/2017   Bipolar disorder (HCC) 04/03/2017   Lipoma of forehead 09/22/2016   Trigger ring finger of right hand 06/22/2016   Paroxysmal atrial fibrillation (HCC)    AF (atrial fibrillation) (HCC) 12/10/2015   Eczema 07/10/2015   History of CVA (cerebrovascular accident) 05/04/2015   Tear of medial meniscus of right knee 03/18/2015   Chronic pain of right knee 03/12/2015   Other and unspecified hyperlipidemia 11/25/2013   Nasal congestion 11/25/2013   Sinusitis, chronic 05/09/2013   Chronic low back pain 10/12/2012   Lumbar radiculopathy 10/12/2012   Hypertension 10/12/2012   Depression 10/12/2012   Insomnia 10/12/2012    PCP: ***  REFERRING PROVIDER: ***  REFERRING DIAG: ***  THERAPY DIAG:  No diagnosis found.  Rationale for Evaluation and Treatment: Rehabilitation  ONSET DATE: ***  SUBJECTIVE:                                                                                                                                                                                       SUBJECTIVE STATEMENT: *** Hand dominance: {MISC; OT HAND DOMINANCE:[phone]}  PERTINENT HISTORY: ***  PAIN:  Are you having pain? Yes: NPRS scale: ***/10 Pain location: *** Pain description: *** Aggravating factors: *** Relieving factors: ***  PRECAUTIONS: {Therapy precautions:24002}  WEIGHT BEARING RESTRICTIONS: {Yes ***/No:24003}  FALLS:  Has patient fallen in last 6 months? {fallsyesno:27318}  LIVING ENVIRONMENT: Lives with: {[facility] lives with:25569::"lives with their family"} Lives in: {Lives in:25570} Stairs: {opstairs:27293} Has following equipment at home: {Assistive devices:23999}  OCCUPATION: ***  PLOF: {PLOF:24004}  PATIENT GOALS:***  NEXT MD VISIT:   OBJECTIVE:   DIAGNOSTIC FINDINGS:  ***  PATIENT SURVEYS:  {rehab surveys:24030:a}  COGNITION: Overall cognitive status: {cognition:24006}     SENSATION: {sensation:27233}  POSTURE: ***  UPPER EXTREMITY ROM:   {AROM/PROM:27142} ROM Right eval Left eval  Shoulder flexion    Shoulder extension    Shoulder abduction    Shoulder adduction    Shoulder internal rotation    Shoulder external rotation    Elbow flexion    Elbow extension    Wrist flexion    Wrist extension    Wrist ulnar deviation    Wrist radial deviation    Wrist pronation    Wrist supination    (Blank rows = not tested)  UPPER EXTREMITY MMT:  MMT Right eval Left eval  Shoulder flexion    Shoulder extension    Shoulder abduction    Shoulder adduction    Shoulder internal rotation    Shoulder external rotation    Middle trapezius    Lower trapezius    Elbow flexion    Elbow extension    Wrist flexion    Wrist extension    Wrist ulnar deviation    Wrist radial deviation    Wrist pronation    Wrist supination    Grip strength (lbs)    (Blank  rows = not tested)  SHOULDER SPECIAL TESTS: Impingement tests: {shoulder impingement test:25231:a} SLAP lesions: {SLAP lesions:25232} Instability tests: {shoulder instability test:25233} Rotator cuff assessment: {rotator cuff assessment:25234} Biceps assessment: {biceps assessment:25235}  JOINT MOBILITY TESTING:  ***  PALPATION:  ***   TODAY'S TREATMENT:                                                                                                                                         DATE: ***   PATIENT EDUCATION: Education details: *** Person educated: {Person educated:25204} Education method: {Education Method:25205} Education comprehension: {Education Comprehension:25206}  HOME EXERCISE PROGRAM: ***  ASSESSMENT:  CLINICAL IMPRESSION: Patient is a *** y.o. *** who was seen today for physical therapy evaluation and treatment for ***.   OBJECTIVE IMPAIRMENTS: {opptimpairments:25111}.   ACTIVITY LIMITATIONS: {activitylimitations:27494}  PARTICIPATION LIMITATIONS: {participationrestrictions:25113}  PERSONAL FACTORS: {Personal factors:25162} are also affecting patient's functional outcome.   REHAB POTENTIAL: {rehabpotential:25112}  CLINICAL DECISION MAKING: {clinical decision making:25114}  EVALUATION COMPLEXITY: {Evaluation complexity:25115}   GOALS: Goals reviewed with patient? {yes/no:20286}  SHORT TERM GOALS: Target date: ***  *** Baseline:  Goal status: {GOALSTATUS:25110}  2.  *** Baseline:  Goal status: {GOALSTATUS:25110}  3.  *** Baseline:  Goal status: {GOALSTATUS:25110}  4.  *** Baseline:  Goal status: {GOALSTATUS:25110}  5.  *** Baseline:  Goal status: {GOALSTATUS:25110}  6.  *** Baseline:  Goal status: {GOALSTATUS:25110}  LONG TERM GOALS: Target date: ***  *** Baseline:  Goal status: {GOALSTATUS:25110}  2.  *** Baseline:  Goal status: {GOALSTATUS:25110}  3.  *** Baseline:  Goal status: {GOALSTATUS:25110}  4.   *** Baseline:  Goal status: {GOALSTATUS:25110}  5.  *** Baseline:  Goal status: {GOALSTATUS:25110}  6.  *** Baseline:  Goal status: {GOALSTATUS:25110}  PLAN:  PT FREQUENCY: {rehab frequency:25116}  PT DURATION: {rehab duration:25117}  PLANNED INTERVENTIONS: {rehab planned interventions:25118::"Therapeutic exercises","Therapeutic activity","Neuromuscular re-education","Balance training","Gait training","Patient/Family education","Self Care","Joint mobilization"}  PLAN FOR NEXT SESSION: ***   Aliyanna Wassmer, PT 12/23/2022, 8:18 AM

## 2022-12-24 ENCOUNTER — Ambulatory Visit
Admission: RE | Admit: 2022-12-24 | Discharge: 2022-12-24 | Disposition: A | Discharge status: Home / Self Care | Payer: Medicaid Other | Source: Ambulatory Visit | Attending: Orthopedic Surgery | Admitting: Orthopedic Surgery

## 2022-12-24 DIAGNOSIS — M48061 Spinal stenosis, lumbar region without neurogenic claudication: Secondary | ICD-10-CM | POA: Diagnosis not present

## 2022-12-24 DIAGNOSIS — M5126 Other intervertebral disc displacement, lumbar region: Secondary | ICD-10-CM | POA: Diagnosis not present

## 2022-12-24 DIAGNOSIS — M47816 Spondylosis without myelopathy or radiculopathy, lumbar region: Secondary | ICD-10-CM | POA: Diagnosis not present

## 2022-12-24 DIAGNOSIS — I480 Paroxysmal atrial fibrillation: Secondary | ICD-10-CM | POA: Diagnosis not present

## 2022-12-24 DIAGNOSIS — M5136 Other intervertebral disc degeneration, lumbar region: Secondary | ICD-10-CM

## 2022-12-24 DIAGNOSIS — M5416 Radiculopathy, lumbar region: Secondary | ICD-10-CM

## 2022-12-25 DIAGNOSIS — I480 Paroxysmal atrial fibrillation: Secondary | ICD-10-CM | POA: Diagnosis not present

## 2022-12-25 IMAGING — CR DG RIBS W/ CHEST 3+V*L*
3 series · 3 of 3 positions shown · non-contrast
Comparison: 12/06/2020.

CLINICAL DATA: Follow-up left hip rib fracture.

EXAM:
LEFT RIBS AND CHEST - 3+ VIEW

[w chest pa]
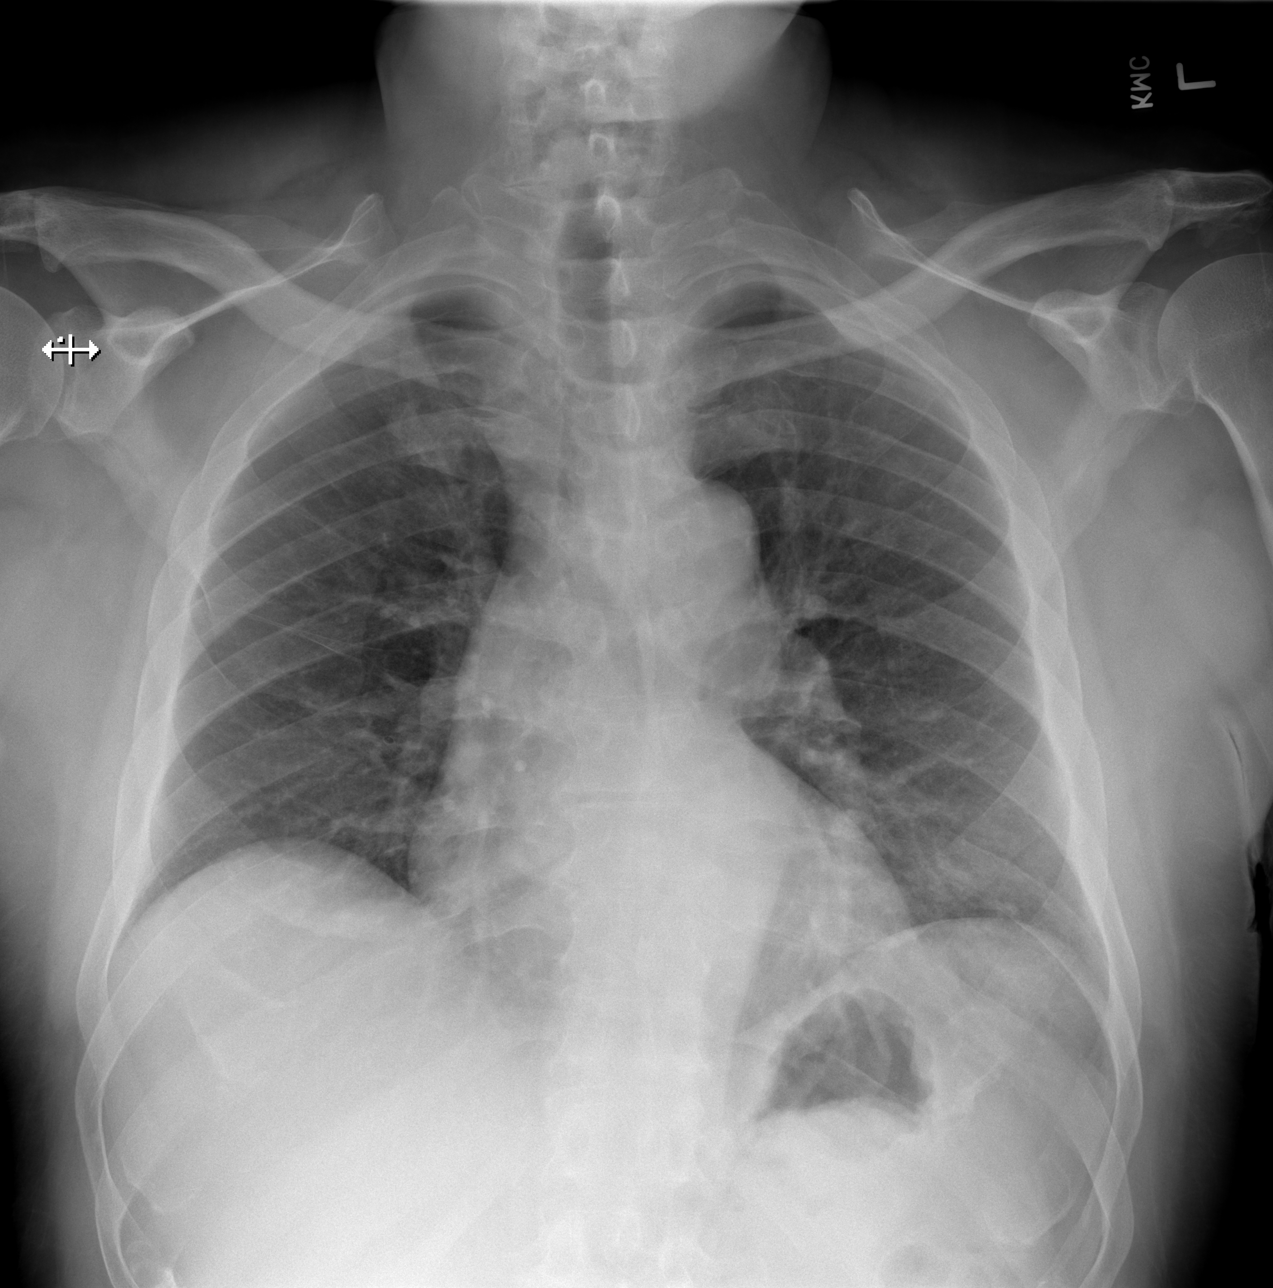

[w ribs obl left (1 of 2)]
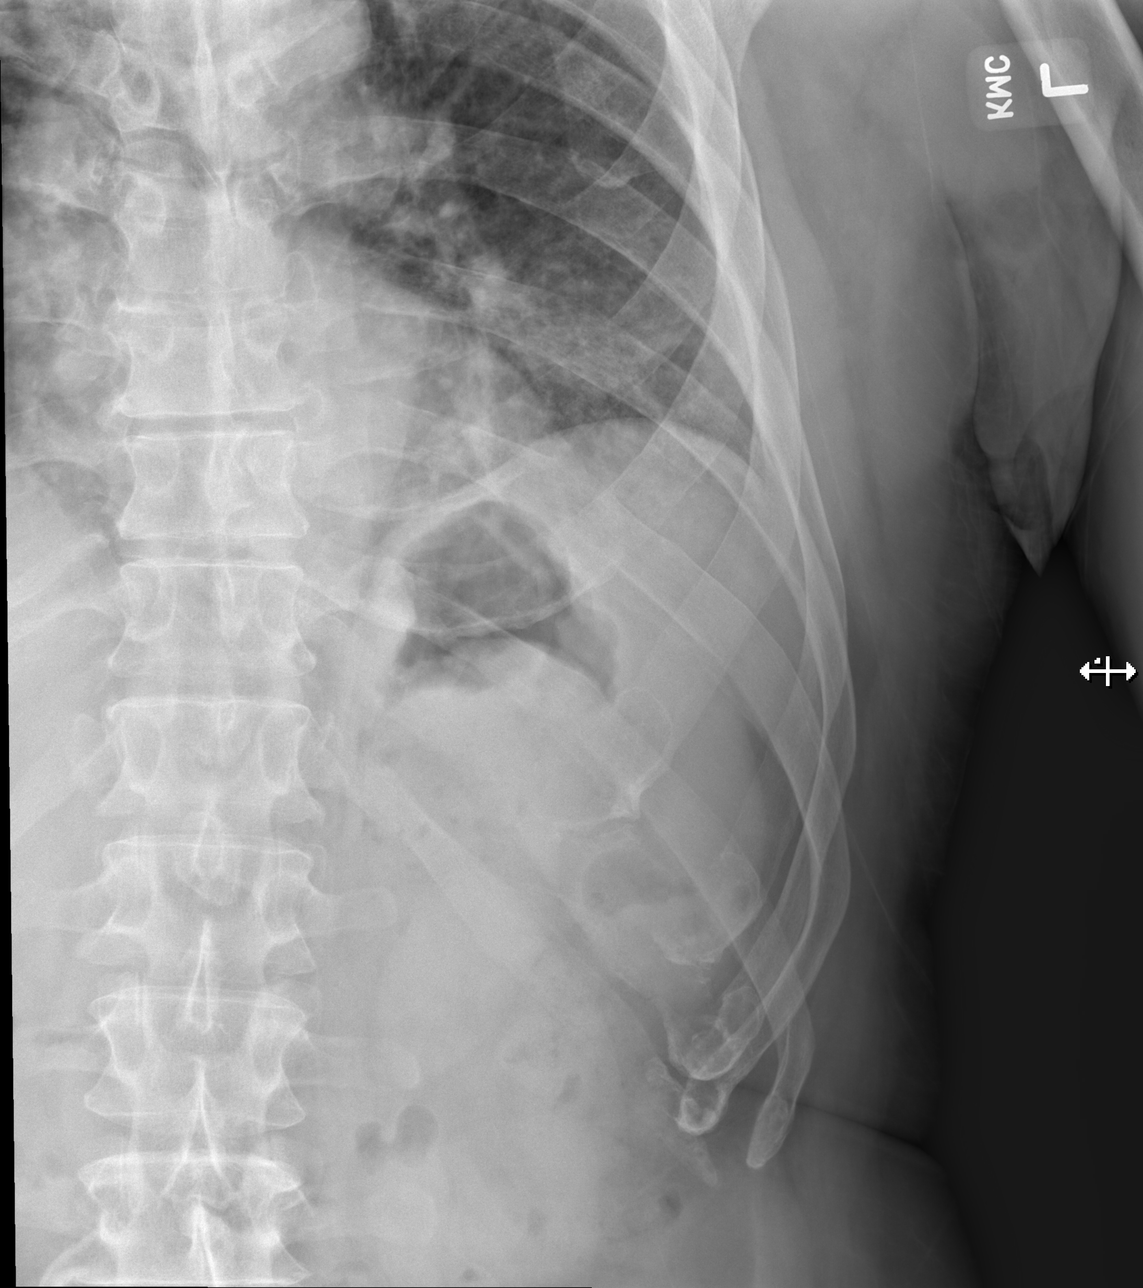

[w ribs obl left (2 of 2)]
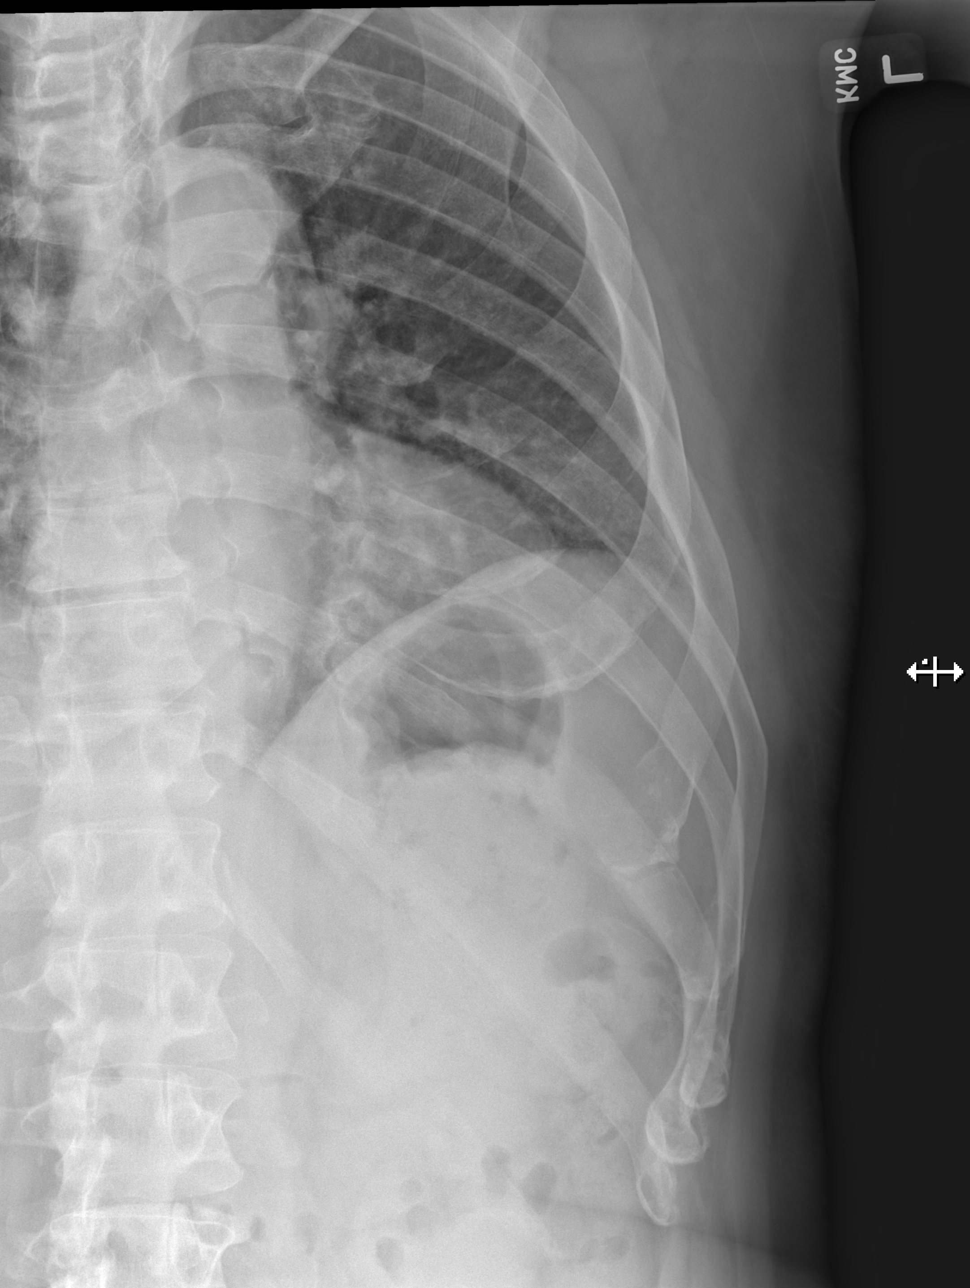

[3 of 3 positions shown; findings below may reference images not displayed]

FINDINGS: Mediastinum hilar structures normal. Heart size normal. Low lung
volumes with bibasilar atelectasis. No pleural effusion. Biapical
pleural thickening consistent scarring noted. Degenerative change
thoracic spine. Left tenth rib fracture better identified on prior
study. No significant displacement. No new rib fracture identified.
No pneumothorax.
IMPRESSION: 1. Left tenth rib fracture better identified on prior study. No
significant displacement. No new rib fracture identified. No
pneumothorax.

2.  Low lung volumes with bibasilar atelectasis.

## 2022-12-26 DIAGNOSIS — I480 Paroxysmal atrial fibrillation: Secondary | ICD-10-CM | POA: Diagnosis not present

## 2022-12-26 NOTE — Telephone Encounter (Signed)
I called patient.  He is having back MRI and wants back injection before shoulder surgery.  We agreed he would call me back to schedule shoulder surgery once he know when that will take place.  I called P.T. office back and left voice mail that patient is having shoulder surgery once back injection done.

## 2022-12-27 DIAGNOSIS — I480 Paroxysmal atrial fibrillation: Secondary | ICD-10-CM | POA: Diagnosis not present

## 2022-12-28 DIAGNOSIS — I480 Paroxysmal atrial fibrillation: Secondary | ICD-10-CM | POA: Diagnosis not present

## 2022-12-29 ENCOUNTER — Ambulatory Visit (INDEPENDENT_AMBULATORY_CARE_PROVIDER_SITE_OTHER): Payer: Medicaid Other

## 2022-12-29 DIAGNOSIS — J309 Allergic rhinitis, unspecified: Secondary | ICD-10-CM | POA: Diagnosis not present

## 2022-12-29 DIAGNOSIS — I480 Paroxysmal atrial fibrillation: Secondary | ICD-10-CM | POA: Diagnosis not present

## 2022-12-30 DIAGNOSIS — I480 Paroxysmal atrial fibrillation: Secondary | ICD-10-CM | POA: Diagnosis not present

## 2022-12-31 DIAGNOSIS — I480 Paroxysmal atrial fibrillation: Secondary | ICD-10-CM | POA: Diagnosis not present

## 2023-01-01 DIAGNOSIS — I480 Paroxysmal atrial fibrillation: Secondary | ICD-10-CM | POA: Diagnosis not present

## 2023-01-02 DIAGNOSIS — I480 Paroxysmal atrial fibrillation: Secondary | ICD-10-CM | POA: Diagnosis not present

## 2023-01-03 ENCOUNTER — Telehealth: Payer: Self-pay | Admitting: Orthopedic Surgery

## 2023-01-03 ENCOUNTER — Other Ambulatory Visit: Payer: Self-pay | Admitting: *Deleted

## 2023-01-03 ENCOUNTER — Telehealth: Payer: Self-pay | Admitting: Allergy and Immunology

## 2023-01-03 DIAGNOSIS — I480 Paroxysmal atrial fibrillation: Secondary | ICD-10-CM | POA: Diagnosis not present

## 2023-01-03 MED ORDER — CROMOLYN SODIUM 4 % OP SOLN: 1.0000 [drp] | Freq: Four times a day (QID) | OPHTHALMIC | 5 refills | Status: DC | PRN

## 2023-01-03 NOTE — Telephone Encounter (Signed)
Pt called in regards to his MRI results, MRI completed 12/24/22. Would like to sched an in person appt to go over these results. Wanted to run this by you and make sure you had seen these

## 2023-01-03 NOTE — Telephone Encounter (Signed)
Patient has been notified that the results have not been read yet and we will call him once they have been read.

## 2023-01-03 NOTE — Telephone Encounter (Signed)
Prescription has been sent in. Called patient and advised. Patient verbalized understanding.  

## 2023-01-03 NOTE — Telephone Encounter (Signed)
I called patient and advised we sent in the Olopatadine last month but he states he never received it. Patient has Medicaid and with that insurance Cromolyn eye drops are preferred. Is it ok to switch?

## 2023-01-03 NOTE — Telephone Encounter (Signed)
Patient request refill for olopatadine. Please send to [facility]

## 2023-01-03 NOTE — Telephone Encounter (Signed)
Let him know that I have not received the MRI results yet (it has not been read).   Once I have the results, we will call him and schedule an in person follow up with me.

## 2023-01-04 DIAGNOSIS — I480 Paroxysmal atrial fibrillation: Secondary | ICD-10-CM | POA: Diagnosis not present

## 2023-01-04 NOTE — Telephone Encounter (Signed)
No results in chart yet.

## 2023-01-05 DIAGNOSIS — I480 Paroxysmal atrial fibrillation: Secondary | ICD-10-CM | POA: Diagnosis not present

## 2023-01-05 NOTE — Telephone Encounter (Signed)
Looks like his results came in yesterday.

## 2023-01-06 DIAGNOSIS — I480 Paroxysmal atrial fibrillation: Secondary | ICD-10-CM | POA: Diagnosis not present

## 2023-01-06 NOTE — Progress Notes (Addendum)
Referring Physician:  No referring provider defined for this encounter.  Primary Physician:  Hoy Register, MD  History of Present Illness: 01/06/2023 Jason Stokes has a history of HTN, afib, stroke, migraine, asthma, GERD, bipolar, schizophrenia.   Last seen by me on 12/15/22 for back and right leg pain x 20+ years.   Imaging from 2022 shows right sided disc at L4-L5 with mild stenosis at L3-L4. Previous ESI at [facility] helped for over a year and a half.   He is here to review his lumbar MRI scan.   He continues with constant LBP with constant right lateral leg pain to his foot with numbness in the leg. He notes some intermittent left leg pain as well. He has numbness, tingling, and weakness in both legs. Pain is worse with standing, bending, and prolonged sitting.   He needs a TKA in right knee as well as left shoulder surgery.   He is on ELIQUIS. He takes robaxin. His last lumbar injection at [facility] helped for over a year (right L5-S1 interlaminar ESI 06/01/21 and 12/29/20).    He smokes occasionally.   Bowel/Bladder Dysfunction: none  Conservative measures:  Physical therapy: did PT for his back 2 years ago with relief Multimodal medical therapy including regular antiinflammatories: robaxin, oxycodone  Injections: right L5-S1 interlaminar ESI 06/01/21 and 12/29/20.   Past Surgery: no spinal surgery  Jason Stokes has no symptoms of cervical myelopathy.  The symptoms are causing a significant impact on the patient's life.   Review of Systems:  A 10 point review of systems is negative, except for the pertinent positives and negatives detailed in the HPI.  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)    a.) s/p embolization 08/19/2020: 29 mm RIGHT internal iliac artery aneurysm   Anxiety    Aortic atherosclerosis (HCC)    Bilateral carpal tunnel syndrome  01/10/2018   Bipolar disorder (HCC)    Chorioretinal inflammation of both eyes    a.) on azothioprine   Chronic lower back pain    Coronary artery calcification seen on CT scan    a.) cCTA 04/21/2022: Ca score 20.6 (61st percentile for age/sex match control)   DDD (degenerative disc disease), cervical    Depression    Diastolic dysfunction    a.) TTE 01/13/2021: EF 60-65%, mod LVH, triv MR, G1DD   GERD (gastroesophageal reflux disease)    Hepatic steatosis    History of alcohol abuse    History of nuclear stress test    Myoview 10/16: EF 50%, diaphragmatic attenuation, no ischemia, low risk   Hypertension    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   a.) CT head 12/25/2012 --> RIGHT basal ganglia hypoattenuation related to remote lacunar infarct   Lipoma    Long term (current) use of anticoagulants    a.) apixaban   Long-term current use of immunomodulator    a.) on azothioprine for peripheral focal chorioretinal inflammation (both eyes)   Marijuana use    Migraine    Moderate persistent asthma with acute exacerbation 05/02/2018   PAF (paroxysmal atrial fibrillation) (HCC) 03/27/2015   a.) CHA2DS2VASc = 5 (HTN, CVA x2, vascular disease history, T2DM);  b.) s/p ablation 10/02/2015; c.) s/p ablation 12/10/2015; d.) s/p DCCV (200 J x 1) 12/11/2015; e.) s/p ablation 04/28/2022; f.) rate/rhythm maintained on oral diltiazem + carvedilol; chronically anticoagulated with apixaban   Pilonidal cyst  Schizophrenia (HCC)    Sciatica neuralgia    T2DM (type 2 diabetes mellitus) (HCC)    Thoracic aortic ectasia (HCC) 01/13/2021   a.) TTE 01/13/2021: Ao root 38 mm, asc Ao 39 mm    Past Surgical History: Past Surgical History:  Procedure Laterality Date   ATRIAL FIBRILLATION ABLATION  09/22/2015   ATRIAL FIBRILLATION ABLATION N/A 04/28/2022   Procedure: ATRIAL FIBRILLATION ABLATION;  Surgeon: Regan Lemming, MD;  Location: [facility];  Service: Cardiovascular;  Laterality: N/A;    CATARACT EXTRACTION Bilateral    CYST EXCISION  1996-97   surgery back of head    ELECTROPHYSIOLOGIC STUDY N/A 09/22/2015   Procedure: Atrial Fibrillation Ablation;  Surgeon: Will Jorja Loa, MD;  Location: [facility];  Service: Cardiovascular;  Laterality: N/A;   ELECTROPHYSIOLOGIC STUDY N/A 12/10/2015   Procedure: Atrial Fibrillation Ablation;  Surgeon: Will Jorja Loa, MD;  Location: [facility];  Service: Cardiovascular;  Laterality: N/A;   ELECTROPHYSIOLOGIC STUDY N/A 12/11/2015   Procedure: Cardioversion;  Surgeon: Will Jorja Loa, MD;  Location: [facility];  Service: Cardiovascular;  Laterality: N/A;   EMBOLIZATION Right 08/19/2020   Procedure: EMBOLIZATION;  Surgeon: Leonie Douglas, MD;  Location: [facility];  Service: Cardiovascular;  Laterality: Right;  hypogastric   EXCISION MASS HEAD N/A 01/06/2017   Procedure: EXCISION MASS FOREHEAD;  Surgeon: Glenna Fellows, MD;  Location: [facility];  Service: Plastics;  Laterality: N/A;   EXCISION MASS UPPER EXTREMETIES Right 08/08/2022   Procedure: EXCISION MASS RIGHT FOREARM;  Surgeon: Betha Loa, MD;  Location: [facility];  Service: Orthopedics;  Laterality: Right;  45 MIN   GANGLION CYST EXCISION Left    INTERCOSTAL NERVE BLOCK  07/19/2003   KNEE ARTHROSCOPY Right 07/18/2014   MASS EXCISION N/A 01/25/2021   Procedure: EXCISION SUBCUTANEOUS VS SUBFASCIAL MASS TORSO 3CM;  Surgeon: Glenna Fellows, MD;  Location: [facility];  Service: Plastics;  Laterality: N/A;   PILONIDAL CYST EXCISION N/A 09/13/2022   Procedure: CYST EXCISION PILONIDAL EXTENSIVE;  Surgeon: Henrene Dodge, MD;  Location: [facility];  Service: General;  Laterality: N/A;    Allergies: Allergies as of 01/09/2023 - Review Complete 12/15/2022  Allergen Reaction Noted   Lisinopril Anaphylaxis 10/12/2012   Tylenol [acetaminophen] Other (See Comments) 04/28/2022   Lyrica  [pregabalin] Palpitations 09/28/2022    Medications: Outpatient Encounter Medications as of 01/09/2023  Medication Sig   cromolyn (OPTICROM) 4 % ophthalmic solution Place 1 drop into both eyes 4 (four) times daily as needed.   ipratropium (ATROVENT) 0.03 % nasal spray Place 2 sprays into both nostrils 4 (four) times daily as needed for rhinitis.   albuterol (VENTOLIN HFA) 108 (90 Base) MCG/ACT inhaler Inhale 2 puffs into the lungs every 4 (four) hours as needed for wheezing or shortness of breath.   apixaban (ELIQUIS) 5 MG TABS tablet Take 1 tablet (5 mg total) by mouth 2 (two) times daily.   atorvastatin (LIPITOR) 40 MG tablet Take 1 tablet (40 mg total) by mouth daily. (Patient taking differently: Take 40 mg by mouth every morning.)   azaTHIOprine (IMURAN) 50 MG tablet Take 100 mg by mouth every morning.   azelastine (ASTELIN) 0.1 % nasal spray Place 1 spray into both nostrils 2 (two) times daily. 1 spray each nostril twice a day   brimonidine (ALPHAGAN) 0.2 % ophthalmic solution Place 1 drop into both eyes every 8 (eight) hours.   carvedilol (COREG) 25 MG  tablet Take 1 tablet (25 mg total) by mouth 2 (two) times daily.   cetirizine (ZYRTEC) 10 MG tablet Take 1 tablet (10 mg total) by mouth daily as needed for allergies (Can take an extra dose during flare ups.).   Difluprednate 0.05 % EMUL Place 1 drop into both eyes 2 (two) times daily.   diltiazem (CARDIZEM CD) 180 MG 24 hr capsule TAKE 1 CAPSULE BY MOUTH EVERY DAY (Patient taking differently: 180 mg every morning. TAKE 1 CAPSULE BY MOUTH EVERY DAY)   dorzolamide-timolol (COSOPT) 22.3-6.8 MG/ML ophthalmic solution Place 1 drop into both eyes 2 (two) times daily.   DULoxetine (CYMBALTA) 60 MG capsule Take 1 capsule (60 mg total) by mouth daily. For chronic pain and neuropathy   EPINEPHrine 0.3 mg/0.3 mL IJ SOAJ injection Inject 0.3 mg into the muscle as needed for anaphylaxis.   fluticasone (FLONASE) 50 MCG/ACT nasal spray 1 spray each  nostril twice a day (Patient taking differently: Place 1 spray into both nostrils as needed. 1 spray each nostril twice a day)   fluticasone furoate-vilanterol (BREO ELLIPTA) 200-25 MCG/ACT AEPB Inhale 1 puff into the lungs daily.   folic acid (FOLVITE) 1 MG tablet Take 1 mg by mouth daily.   furosemide (LASIX) 20 MG tablet Take 1 tablet (20 mg total) by mouth daily.   GARLIC PO Take 1 tablet by mouth daily.   glucose blood (ACCU-CHEK GUIDE) test strip Use to check blood sugar once daily. E11.69   latanoprost (XALATAN) 0.005 % ophthalmic solution Place 1 drop into both eyes daily.   metFORMIN (GLUCOPHAGE) 500 MG tablet Take 1 tablet (500 mg total) by mouth 2 (two) times daily with a meal. For 1 week then increase to 2 tablets twice a day   methocarbamol (ROBAXIN) 500 MG tablet TAKE 1 TABLET BY MOUTH EVERY 8 HOURS AS NEEDED FOR MUSCLE SPASM   Misc. Devices MISC Back brace. Diagnosis chronic back pain   Misc. Devices MISC Left knee brace. Diagnosis L knee pain   montelukast (SINGULAIR) 10 MG tablet Take 1 tablet (10 mg total) by mouth at bedtime.   Olopatadine HCl (PATADAY) 0.2 % SOLN Place 1 drop into both eyes daily as needed.   omeprazole (PRILOSEC) 20 MG capsule TAKE 1 CAPSULE BY MOUTH EVERY DAY (Patient taking differently: 20 mg every morning. TAKE 1 CAPSULE BY MOUTH EVERY DAY)   oxyCODONE 10 MG TABS Take 1 tablet (10 mg total) by mouth every 4 (four) hours as needed for severe pain.   Vitamin D3 (VITAMIN D) 25 MCG tablet Take 1,000 Units by mouth daily.   No facility-administered encounter medications on file as of 01/09/2023.    Social History: Social History   Tobacco Use   Smoking status: Some Days    Years: 29    Types: Cigarettes    Last attempt to quit: 09/10/2022    Years since quitting: 0.3    Passive exposure: Never   Smokeless tobacco: Never   Tobacco comments:    1 cigarettes every day  06-02-2021  Vaping Use   Vaping Use: Never used  Substance Use Topics   Alcohol  use: Yes    Alcohol/week: 6.0 standard drinks of alcohol    Types: 6 Cans of beer per week    Comment: 6 pack weekly 05/26/22   Drug use: Yes    Types: Marijuana    Comment: sunday was last time    Family Medical History: Family History  Problem Relation Age of Onset  Heart disease Father    Schizophrenia Sister     Physical Examination: There were no vitals filed for this visit.    Awake, alert, oriented to person, place, and time.  Speech is clear and fluent. Fund of knowledge is appropriate.   Cranial Nerves: Pupils equal round and reactive to light.  Facial tone is symmetric.    He has diffuse posterior lower lumbar tenderness.   No abnormal lesions on exposed skin.   Strength:  Side Iliopsoas Quads Hamstring PF DF EHL  R 5 5 5 5 5 5   L 5 5 5 5 5 5    Reflexes are 2+ and symmetric at the patella and achilles.    Clonus is not present.   Bilateral lower extremity sensation is intact to light touch.     He ambulates with a cane.   Medical Decision Making  Imaging: MRI of lumbar spine dated 12/24/22:  FINDINGS: Segmentation:  Standard.   Alignment:  Physiologic.   Vertebrae: No fracture, evidence of discitis, or bone lesion. Mild diffuse intrinsic canal narrowing on the basis of congenitally short pedicles.   Conus medullaris and cauda equina: Conus extends to the T12-L1 level. Conus and cauda equina appear normal.   Paraspinal and other soft tissues: Negative.   Disc levels:   T12-L1: No significant disc protrusion, foraminal stenosis, or canal stenosis. Unchanged.   L1-L2: Minimal annular disc bulge. Mild facet hypertrophy. No foraminal or canal stenosis. Unchanged.   L2-L3: Mild annular disc bulge. Mild bilateral facet hypertrophy. Mild canal stenosis. No significant foraminal stenosis. Unchanged.   L3-L4: Annular disc bulge with mild facet hypertrophy. Mild prominence of the posterior epidural fat. Moderate canal stenosis with mild-to-moderate  bilateral foraminal stenosis. Findings have progressed from prior.   L4-L5: Annular disc bulge with mild facet hypertrophy. Mild prominence of the posterior epidural fat. Mild bilateral subarticular recess stenosis and mild canal stenosis. Mild-to-moderate bilateral foraminal stenosis. Minimal interval progression.   L5-S1: Mild disc bulge and bilateral facet arthropathy. No canal stenosis. Mild bilateral foraminal stenosis, more pronounced on the right. Unchanged.   IMPRESSION: 1. Mild multilevel degenerative changes of the lumbar spine superimposed on mild diffuse intrinsic canal narrowing on the basis of congenitally short pedicles. Findings have slightly progressed since 2021. 2. Moderate canal stenosis at L3-4 with mild-to-moderate bilateral foraminal stenosis. 3. Mild canal stenosis at L2-3 and L4-5. 4. Mild-to-moderate bilateral foraminal stenosis at L4-5. Mild bilateral foraminal stenosis at L5-S1.     Electronically Signed   By: Duanne Guess D.O.   On: 01/04/2023 16:47    I have personally reviewed the images and agree with the above interpretation.    Assessment and Plan: Jason Stokes is a pleasant 62 y.o. male continues with constant LBP with constant right lateral leg pain to his foot with numbness in the leg. He notes some intermittent left leg pain as well. He has numbness, tingling, and weakness in both legs. Pain is worse with standing, bending, and prolonged sitting.   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 Also with bilateral recess stenosis and foraminal stenosis at L4-L5 with mild central stenosis and mild bilateral foraminal stenosis L5-S1. Primary pain generator is likely L4-L5.    Treatment options discussed with patient and following plan made:   - Referral to PMR at [facility] to discuss lumbar injections.  - He would like ESI to be done ASAP so he can get his left shoulder surgery  scheduled.  -  He has phone visit scheduled with me in 6-8 weeks to check on his progress (on his birthday!).   I spent a total of 20 minutes in face-to-face and non-face-to-face activities related to this patient's care today including review of outside records, review of imaging, review of symptoms, physical exam, discussion of differential diagnosis, discussion of treatment options, and documentation.   ADDENDUM 01/30/23:  [facility] in [place] does not take his insurance. He needs a provider within the [facility] system. Will send to PMR in [place] (Dr. Wynn Banker).   Drake Leach PA-C Dept. of Neurosurgery

## 2023-01-07 DIAGNOSIS — I480 Paroxysmal atrial fibrillation: Secondary | ICD-10-CM | POA: Diagnosis not present

## 2023-01-08 DIAGNOSIS — I480 Paroxysmal atrial fibrillation: Secondary | ICD-10-CM | POA: Diagnosis not present

## 2023-01-09 ENCOUNTER — Ambulatory Visit (INDEPENDENT_AMBULATORY_CARE_PROVIDER_SITE_OTHER): Payer: Medicaid Other | Admitting: Orthopedic Surgery

## 2023-01-09 ENCOUNTER — Encounter: Payer: Self-pay | Admitting: Orthopedic Surgery

## 2023-01-09 VITALS — BP 130/78 | Ht 73.0 in | Wt 246.3 lb

## 2023-01-09 DIAGNOSIS — M47816 Spondylosis without myelopathy or radiculopathy, lumbar region: Secondary | ICD-10-CM

## 2023-01-09 DIAGNOSIS — I480 Paroxysmal atrial fibrillation: Secondary | ICD-10-CM | POA: Diagnosis not present

## 2023-01-09 DIAGNOSIS — M5416 Radiculopathy, lumbar region: Secondary | ICD-10-CM

## 2023-01-09 DIAGNOSIS — M48061 Spinal stenosis, lumbar region without neurogenic claudication: Secondary | ICD-10-CM

## 2023-01-09 DIAGNOSIS — M4726 Other spondylosis with radiculopathy, lumbar region: Secondary | ICD-10-CM | POA: Diagnosis not present

## 2023-01-09 NOTE — Patient Instructions (Signed)
It was so nice to see you today. Thank you so much for coming in.    You have some wear and tear in your back (arthritis) and this is likely causing your pain.   I want you to see physical medicine and rehab at the [facility] to discuss possible injections in your lower back. Dr. Yves Dill, Dr. Mariah Milling, and their PA Alphonzo Lemmings are great and will take good care of you. They should call you to schedule an appointment or you can call them at [phone].   You are scheduled for a phone visit in 6-8 weeks with me. Please do not hesitate to call if you have any questions or concerns. You can also message me in MyChart.   Drake Leach PA-C [phone]

## 2023-01-10 DIAGNOSIS — I480 Paroxysmal atrial fibrillation: Secondary | ICD-10-CM | POA: Diagnosis not present

## 2023-01-11 DIAGNOSIS — I480 Paroxysmal atrial fibrillation: Secondary | ICD-10-CM | POA: Diagnosis not present

## 2023-01-12 DIAGNOSIS — I480 Paroxysmal atrial fibrillation: Secondary | ICD-10-CM | POA: Diagnosis not present

## 2023-01-13 DIAGNOSIS — I480 Paroxysmal atrial fibrillation: Secondary | ICD-10-CM | POA: Diagnosis not present

## 2023-01-14 DIAGNOSIS — I480 Paroxysmal atrial fibrillation: Secondary | ICD-10-CM | POA: Diagnosis not present

## 2023-01-15 DIAGNOSIS — I480 Paroxysmal atrial fibrillation: Secondary | ICD-10-CM | POA: Diagnosis not present

## 2023-01-16 DIAGNOSIS — I480 Paroxysmal atrial fibrillation: Secondary | ICD-10-CM | POA: Diagnosis not present

## 2023-01-17 ENCOUNTER — Ambulatory Visit (INDEPENDENT_AMBULATORY_CARE_PROVIDER_SITE_OTHER): Payer: MEDICAID

## 2023-01-17 DIAGNOSIS — J309 Allergic rhinitis, unspecified: Secondary | ICD-10-CM | POA: Diagnosis not present

## 2023-01-17 DIAGNOSIS — I480 Paroxysmal atrial fibrillation: Secondary | ICD-10-CM | POA: Diagnosis not present

## 2023-01-18 DIAGNOSIS — I480 Paroxysmal atrial fibrillation: Secondary | ICD-10-CM | POA: Diagnosis not present

## 2023-01-19 DIAGNOSIS — I480 Paroxysmal atrial fibrillation: Secondary | ICD-10-CM | POA: Diagnosis not present

## 2023-01-20 DIAGNOSIS — I480 Paroxysmal atrial fibrillation: Secondary | ICD-10-CM | POA: Diagnosis not present

## 2023-01-21 DIAGNOSIS — I480 Paroxysmal atrial fibrillation: Secondary | ICD-10-CM | POA: Diagnosis not present

## 2023-01-22 DIAGNOSIS — I480 Paroxysmal atrial fibrillation: Secondary | ICD-10-CM | POA: Diagnosis not present

## 2023-01-23 DIAGNOSIS — I480 Paroxysmal atrial fibrillation: Secondary | ICD-10-CM | POA: Diagnosis not present

## 2023-01-24 DIAGNOSIS — I480 Paroxysmal atrial fibrillation: Secondary | ICD-10-CM | POA: Diagnosis not present

## 2023-01-25 ENCOUNTER — Ambulatory Visit (INDEPENDENT_AMBULATORY_CARE_PROVIDER_SITE_OTHER): Payer: MEDICAID

## 2023-01-25 DIAGNOSIS — J309 Allergic rhinitis, unspecified: Secondary | ICD-10-CM | POA: Diagnosis not present

## 2023-01-25 DIAGNOSIS — I480 Paroxysmal atrial fibrillation: Secondary | ICD-10-CM | POA: Diagnosis not present

## 2023-01-26 DIAGNOSIS — I480 Paroxysmal atrial fibrillation: Secondary | ICD-10-CM | POA: Diagnosis not present

## 2023-01-27 DIAGNOSIS — I480 Paroxysmal atrial fibrillation: Secondary | ICD-10-CM | POA: Diagnosis not present

## 2023-01-28 DIAGNOSIS — I480 Paroxysmal atrial fibrillation: Secondary | ICD-10-CM | POA: Diagnosis not present

## 2023-01-29 DIAGNOSIS — I480 Paroxysmal atrial fibrillation: Secondary | ICD-10-CM | POA: Diagnosis not present

## 2023-01-30 DIAGNOSIS — I480 Paroxysmal atrial fibrillation: Secondary | ICD-10-CM | POA: Diagnosis not present

## 2023-01-30 NOTE — Addendum Note (Signed)
Addended byDrake Leach on: 01/30/2023 02:48 PM   Modules accepted: Orders

## 2023-01-31 DIAGNOSIS — I480 Paroxysmal atrial fibrillation: Secondary | ICD-10-CM | POA: Diagnosis not present

## 2023-02-01 ENCOUNTER — Ambulatory Visit (INDEPENDENT_AMBULATORY_CARE_PROVIDER_SITE_OTHER): Payer: MEDICAID | Admitting: *Deleted

## 2023-02-01 DIAGNOSIS — I480 Paroxysmal atrial fibrillation: Secondary | ICD-10-CM | POA: Diagnosis not present

## 2023-02-01 DIAGNOSIS — J309 Allergic rhinitis, unspecified: Secondary | ICD-10-CM

## 2023-02-02 ENCOUNTER — Encounter: Payer: Self-pay | Admitting: Physical Medicine & Rehabilitation

## 2023-02-02 DIAGNOSIS — I480 Paroxysmal atrial fibrillation: Secondary | ICD-10-CM | POA: Diagnosis not present

## 2023-02-03 DIAGNOSIS — I480 Paroxysmal atrial fibrillation: Secondary | ICD-10-CM | POA: Diagnosis not present

## 2023-02-03 DIAGNOSIS — M65341 Trigger finger, right ring finger: Secondary | ICD-10-CM | POA: Diagnosis not present

## 2023-02-04 DIAGNOSIS — I480 Paroxysmal atrial fibrillation: Secondary | ICD-10-CM | POA: Diagnosis not present

## 2023-02-05 DIAGNOSIS — I480 Paroxysmal atrial fibrillation: Secondary | ICD-10-CM | POA: Diagnosis not present

## 2023-02-06 ENCOUNTER — Telehealth: Payer: Self-pay | Admitting: *Deleted

## 2023-02-06 DIAGNOSIS — I480 Paroxysmal atrial fibrillation: Secondary | ICD-10-CM | POA: Diagnosis not present

## 2023-02-06 NOTE — Telephone Encounter (Signed)
Patient with diagnosis of atrial fibrillation on Eliquis for anticoagulation.    Procedure:   RIGHT RIGHT  A1 PULLEY   Date of Surgery:  Clearance TBD         CHA2DS2-VASc Score = 5   This indicates a 7.2% annual risk of stroke. The patient's score is based upon: CHF History: 0 HTN History: 1 Diabetes History: 1 Stroke History: 2 Vascular Disease History: 1 Age Score: 0 Gender Score: 0   Per chart, CVA was > 10 years ago  CrCl 113 Platelet count 232  Per office protocol, patient can hold Eliquis for 2 days prior to procedure.   Patient will not need bridging with Lovenox (enoxaparin) around procedure.  **This guidance is not considered finalized until pre-operative APP has relayed final recommendations.**

## 2023-02-06 NOTE — Telephone Encounter (Signed)
   Pre-operative Risk Assessment    Patient Name: Jason Stokes  DOB: [birth year] MRN: [id number]      Request for Surgical Clearance    Procedure:   RIGHT RIGHT  A1 PULLEY  Date of Surgery:  Clearance TBD                                 Surgeon:  DR Waylan Rocher  Surgeon's Group or Practice Name:  [facility]  Phone number:  [phone] Fax number:  [phone] ATTN KERI    Type of Clearance Requested:   - Pharmacy:  Hold Apixaban (Eliquis)     Type of Anesthesia:  General    Additional requests/questions:   N/A  [name]   02/06/2023, 11:17 AM

## 2023-02-06 NOTE — Telephone Encounter (Signed)
   Patient Name: Jason Stokes  DOB: 1961-06-25 MRN: [id number]  Primary Cardiologist: Will Jorja Loa, MD  Clinical pharmacists have reviewed the patient's past medical history, labs, and current medications as part of preoperative protocol coverage. The following recommendations have been made:  Patient with diagnosis of atrial fibrillation on Eliquis for anticoagulation.     Procedure:   RIGHT RIGHT  A1 PULLEY   Date of Surgery:  Clearance TBD           CHA2DS2-VASc Score = 5   This indicates a 7.2% annual risk of stroke. The patient's score is based upon: CHF History: 0 HTN History: 1 Diabetes History: 1 Stroke History: 2 Vascular Disease History: 1 Age Score: 0 Gender Score: 0   Per chart, CVA was > 10 years ago   CrCl 113 Platelet count 232   Per office protocol, patient can hold Eliquis for 2 days prior to procedure.   Patient will not need bridging with Lovenox (enoxaparin) around procedure.     I will route this recommendation to the requesting party via Epic fax function and remove from pre-op pool.  Please call with questions.  Joni Reining, NP 02/06/2023, 1:29 PM

## 2023-02-07 DIAGNOSIS — I480 Paroxysmal atrial fibrillation: Secondary | ICD-10-CM | POA: Diagnosis not present

## 2023-02-08 DIAGNOSIS — I480 Paroxysmal atrial fibrillation: Secondary | ICD-10-CM | POA: Diagnosis not present

## 2023-02-09 ENCOUNTER — Ambulatory Visit (INDEPENDENT_AMBULATORY_CARE_PROVIDER_SITE_OTHER): Payer: MEDICAID

## 2023-02-09 DIAGNOSIS — J309 Allergic rhinitis, unspecified: Secondary | ICD-10-CM | POA: Diagnosis not present

## 2023-02-09 DIAGNOSIS — I480 Paroxysmal atrial fibrillation: Secondary | ICD-10-CM | POA: Diagnosis not present

## 2023-02-10 DIAGNOSIS — I480 Paroxysmal atrial fibrillation: Secondary | ICD-10-CM | POA: Diagnosis not present

## 2023-02-11 DIAGNOSIS — I480 Paroxysmal atrial fibrillation: Secondary | ICD-10-CM | POA: Diagnosis not present

## 2023-02-12 DIAGNOSIS — I480 Paroxysmal atrial fibrillation: Secondary | ICD-10-CM | POA: Diagnosis not present

## 2023-02-13 DIAGNOSIS — I480 Paroxysmal atrial fibrillation: Secondary | ICD-10-CM | POA: Diagnosis not present

## 2023-02-13 DIAGNOSIS — Z961 Presence of intraocular lens: Secondary | ICD-10-CM | POA: Diagnosis not present

## 2023-02-13 DIAGNOSIS — H30033 Focal chorioretinal inflammation, peripheral, bilateral: Secondary | ICD-10-CM | POA: Diagnosis not present

## 2023-02-13 DIAGNOSIS — Z79899 Other long term (current) drug therapy: Secondary | ICD-10-CM | POA: Diagnosis not present

## 2023-02-13 DIAGNOSIS — H4043X2 Glaucoma secondary to eye inflammation, bilateral, moderate stage: Secondary | ICD-10-CM | POA: Diagnosis not present

## 2023-02-13 DIAGNOSIS — H3581 Retinal edema: Secondary | ICD-10-CM | POA: Diagnosis not present

## 2023-02-14 DIAGNOSIS — I480 Paroxysmal atrial fibrillation: Secondary | ICD-10-CM | POA: Diagnosis not present

## 2023-02-15 DIAGNOSIS — I480 Paroxysmal atrial fibrillation: Secondary | ICD-10-CM | POA: Diagnosis not present

## 2023-02-16 DIAGNOSIS — M65341 Trigger finger, right ring finger: Secondary | ICD-10-CM | POA: Diagnosis not present

## 2023-02-16 DIAGNOSIS — G8929 Other chronic pain: Secondary | ICD-10-CM | POA: Diagnosis not present

## 2023-02-17 DIAGNOSIS — G8929 Other chronic pain: Secondary | ICD-10-CM | POA: Diagnosis not present

## 2023-02-17 NOTE — Progress Notes (Signed)
   Telephone Visit- Progress Note:  He was scheduled for phone visit. This was not completed and was rescheduled. No charge done.   Drake Leach PA-C Neurosurgery

## 2023-02-18 DIAGNOSIS — G8929 Other chronic pain: Secondary | ICD-10-CM | POA: Diagnosis not present

## 2023-02-19 DIAGNOSIS — G8929 Other chronic pain: Secondary | ICD-10-CM | POA: Diagnosis not present

## 2023-02-20 DIAGNOSIS — G8929 Other chronic pain: Secondary | ICD-10-CM | POA: Diagnosis not present

## 2023-02-21 ENCOUNTER — Encounter: Payer: Self-pay | Admitting: Physical Medicine & Rehabilitation

## 2023-02-21 ENCOUNTER — Encounter: Payer: Medicaid Other | Attending: Physical Medicine & Rehabilitation | Admitting: Physical Medicine & Rehabilitation

## 2023-02-21 VITALS — BP 155/111 | HR 115 | Ht 73.0 in | Wt 234.0 lb

## 2023-02-21 DIAGNOSIS — M5416 Radiculopathy, lumbar region: Secondary | ICD-10-CM | POA: Insufficient documentation

## 2023-02-21 DIAGNOSIS — G8929 Other chronic pain: Secondary | ICD-10-CM | POA: Diagnosis not present

## 2023-02-21 NOTE — Progress Notes (Signed)
Subjective:    Patient ID: Jason Stokes, male    DOB: 17-Sep-1960, 62 y.o.   MRN: [id number]  HPI 62 year old male with history of atrial fibrillation on anticoagulation, rotator cuff syndrome, cervical stenosis as well as chronic low back pain with right greater than left radicular pain who is referred to this office to evaluate for epidural steroid injection.  The patient is planning to have a rotator cuff repair with orthopedics.  He would like to get his back feeling better before he pursues this.  In addition he feels like his right-sided sciatica has spread to the left side.  He has been evaluated by neurosurgery who sent him through physical therapy and a repeat lumbar MRI with results as below.  From a symptomatology standpoint, the patient has pain and numbness in both big toes in both little toes.  He states that he is a borderline diabetic.  His blood pressure control has also been inconsistent which he states is related to his pain. Several year history of low back pain but worsening over the last 3+ months.  Last PT finished in April of 2024.  No improvement in low back or lower extremity pain.  He has been on narcotic analgesics for his pain.  He is not a good candidate for nonsteroidal anti-inflammatories due to Eliquis.  MRI LUMBAR SPINE WITHOUT CONTRAST   TECHNIQUE: Multiplanar, multisequence MR imaging of the lumbar spine was performed. No intravenous contrast was administered.   COMPARISON:  MRI 05/27/2020   FINDINGS: Segmentation:  Standard.   Alignment:  Physiologic.   Vertebrae: No fracture, evidence of discitis, or bone lesion. Mild diffuse intrinsic canal narrowing on the basis of congenitally short pedicles.   Conus medullaris and cauda equina: Conus extends to the T12-L1 level. Conus and cauda equina appear normal.   Paraspinal and other soft tissues: Negative.   Disc levels:   T12-L1: No significant disc protrusion, foraminal stenosis, or  canal stenosis. Unchanged.   L1-L2: Minimal annular disc bulge. Mild facet hypertrophy. No foraminal or canal stenosis. Unchanged.   L2-L3: Mild annular disc bulge. Mild bilateral facet hypertrophy. Mild canal stenosis. No significant foraminal stenosis. Unchanged.   L3-L4: Annular disc bulge with mild facet hypertrophy. Mild prominence of the posterior epidural fat. Moderate canal stenosis with mild-to-moderate bilateral foraminal stenosis. Findings have progressed from prior.   L4-L5: Annular disc bulge with mild facet hypertrophy. Mild prominence of the posterior epidural fat. Mild bilateral subarticular recess stenosis and mild canal stenosis. Mild-to-moderate bilateral foraminal stenosis. Minimal interval progression.   L5-S1: Mild disc bulge and bilateral facet arthropathy. No canal stenosis. Mild bilateral foraminal stenosis, more pronounced on the right. Unchanged.   IMPRESSION: 1. Mild multilevel degenerative changes of the lumbar spine superimposed on mild diffuse intrinsic canal narrowing on the basis of congenitally short pedicles. Findings have slightly progressed since 2021. 2. Moderate canal stenosis at L3-4 with mild-to-moderate bilateral foraminal stenosis. 3. Mild canal stenosis at L2-3 and L4-5. 4. Mild-to-moderate bilateral foraminal stenosis at L4-5. Mild bilateral foraminal stenosis at L5-S1.     Electronically Signed   By: Duanne Guess D.O. Pain Inventory Average Pain 10 Pain Right Now 9 My pain is constant, sharp, burning, and tingling  In the last 24 hours, has pain interfered with the following? General activity 0 Relation with others 0 Enjoyment of life 0 What TIME of day is your pain at its worst? daytime and night Sleep (in general) Poor  Pain is worse with: bending and sitting  Pain improves with: therapy/exercise Relief from Meds: 6  ability to climb steps?  no do you drive?  yes  disabled: date disabled 2013 I need  assistance with the following:  dressing, bathing, meal prep, household duties, and shopping Do you have any goals in this area?  yes  numbness tremor tingling spasms anxiety  Any changes since last visit?  no  Any changes since last visit?  no    Family History  Problem Relation Age of Onset   Heart disease Father    Schizophrenia Sister    Social History   Socioeconomic History   Marital status: Single    Spouse name: [name]   Number of children: 3   Years of education: HS   Highest education level: 11th grade  Occupational History    Comment: disabled  Tobacco Use   Smoking status: Some Days    Current packs/day: 0.00    Types: Cigarettes    Start date: 09/10/1993    Last attempt to quit: 09/10/2022    Years since quitting: 0.4    Passive exposure: Never   Smokeless tobacco: Never   Tobacco comments:    1 cigarettes every day  06-02-2021  Vaping Use   Vaping status: Never Used  Substance and Sexual Activity   Alcohol use: Yes    Alcohol/week: 6.0 standard drinks of alcohol    Types: 6 Cans of beer per week    Comment: 6 pack weekly 05/26/22   Drug use: Yes    Types: Marijuana    Comment: sunday was last time   Sexual activity: Not Currently  Other Topics Concern   Not on file  Social History Narrative   Patient lives at home with [place].    Patient has 3 children 2 step.    Patient has 13 years of schooling.    Patient is right handed.    Social Determinants of Health   Financial Resource Strain: Low Risk  (11/15/2022)   Overall Financial Resource Strain (CARDIA)    Difficulty of Paying Living Expenses: Not hard at all  Food Insecurity: Food Insecurity Present (11/15/2022)   Hunger Vital Sign    Worried About Running Out of Food in the Last Year: Never true    Ran Out of Food in the Last Year: Sometimes true  Transportation Needs: No Transportation Needs (11/15/2022)   PRAPARE - Administrator, Civil Service (Medical): No    Lack of  Transportation (Non-Medical): No  Physical Activity: Unknown (11/15/2022)   Exercise Vital Sign    Days of Exercise per Week: 0 days    Minutes of Exercise per Session: Not on file  Stress: No Stress Concern Present (11/15/2022)   [employer] of Occupational Health - Occupational Stress Questionnaire    Feeling of Stress : Not at all  Social Connections: Socially Isolated (11/15/2022)   Social Connection and Isolation Panel [NHANES]    Frequency of Communication with Friends and Family: Twice a week    Frequency of Social Gatherings with Friends and Family: Once a week    Attends Religious Services: Never    Database administrator or Organizations: No    Attends Engineer, structural: Not on file    Marital Status: Never married   Past Surgical History:  Procedure Laterality Date   ATRIAL FIBRILLATION ABLATION  09/22/2015   ATRIAL FIBRILLATION ABLATION N/A 04/28/2022   Procedure: ATRIAL FIBRILLATION ABLATION;  Surgeon: Regan Lemming, MD;  [facility];  Service: Cardiovascular;  Laterality: N/A;   CATARACT EXTRACTION Bilateral    CYST EXCISION  1996-97   surgery back of head    ELECTROPHYSIOLOGIC STUDY N/A 09/22/2015   Procedure: Atrial Fibrillation Ablation;  Surgeon: Will Jorja Loa, MD;  [facility]: [facility];  Service: Cardiovascular;  Laterality: N/A;   ELECTROPHYSIOLOGIC STUDY N/A 12/10/2015   Procedure: Atrial Fibrillation Ablation;  Surgeon: Will Jorja Loa, MD;  [facility]: [facility];  Service: Cardiovascular;  Laterality: N/A;   ELECTROPHYSIOLOGIC STUDY N/A 12/11/2015   Procedure: Cardioversion;  Surgeon: Will Jorja Loa, MD;  [facility];  Service: Cardiovascular;  Laterality: N/A;   EMBOLIZATION (CATH LAB) Right 08/19/2020   Procedure: EMBOLIZATION;  Surgeon: Leonie Douglas, MD;  [facility]: [facility];  Service: Cardiovascular;  Laterality: Right;  hypogastric   EXCISION MASS HEAD N/A  01/06/2017   Procedure: EXCISION MASS FOREHEAD;  Surgeon: Glenna Fellows, MD;  Location: [facility];  Service: Plastics;  Laterality: N/A;   EXCISION MASS UPPER EXTREMETIES Right 08/08/2022   Procedure: EXCISION MASS RIGHT FOREARM;  Surgeon: Betha Loa, MD;  Location: [facility];  Service: Orthopedics;  Laterality: Right;  45 MIN   GANGLION CYST EXCISION Left    INTERCOSTAL NERVE BLOCK  07/19/2003   KNEE ARTHROSCOPY Right 07/18/2014   MASS EXCISION N/A 01/25/2021   Procedure: EXCISION SUBCUTANEOUS VS SUBFASCIAL MASS TORSO 3CM;  Surgeon: Glenna Fellows, MD;  Location: [facility];  Service: Plastics;  Laterality: N/A;   PILONIDAL CYST EXCISION N/A 09/13/2022   Procedure: CYST EXCISION PILONIDAL EXTENSIVE;  Surgeon: Henrene Dodge, MD;  Location: [facility];  Service: General;  Laterality: N/A;   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)    a.) s/p embolization 08/19/2020: 29 mm RIGHT internal iliac artery aneurysm   Anxiety    Aortic atherosclerosis (HCC)    Bilateral carpal tunnel syndrome 01/10/2018   Bipolar disorder (HCC)    Chorioretinal inflammation of both eyes    a.) on azothioprine   Chronic lower back pain    Coronary artery calcification seen on CT scan    a.) cCTA 04/21/2022: Ca score 20.6 (61st percentile for age/sex match control)   DDD (degenerative disc disease), cervical    Depression    Diastolic dysfunction    a.) TTE 01/13/2021: EF 60-65%, mod LVH, triv MR, G1DD   GERD (gastroesophageal reflux disease)    Hepatic steatosis    History of alcohol abuse    History of nuclear stress test    Myoview 10/16: EF 50%, diaphragmatic attenuation, no ischemia, low risk   Hypertension    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   a.) CT head 12/25/2012 --> RIGHT basal ganglia hypoattenuation related to remote lacunar infarct   Lipoma    Long term (current) use of  anticoagulants    a.) apixaban   Long-term current use of immunomodulator    a.) on azothioprine for peripheral focal chorioretinal inflammation (both eyes)   Marijuana use    Migraine    Moderate persistent asthma with acute exacerbation 05/02/2018   PAF (paroxysmal atrial fibrillation) (HCC) 03/27/2015   a.) CHA2DS2VASc = 5 (HTN, CVA x2, vascular disease history, T2DM);  b.) s/p ablation 10/02/2015; c.) s/p ablation 12/10/2015; d.) s/p DCCV (200 J x 1) 12/11/2015; e.) s/p ablation 04/28/2022; f.) rate/rhythm maintained on oral diltiazem + carvedilol; chronically anticoagulated with apixaban  Pilonidal cyst    Schizophrenia (HCC)    Sciatica neuralgia    T2DM (type 2 diabetes mellitus) (HCC)    Thoracic aortic ectasia (HCC) 01/13/2021   a.) TTE 01/13/2021: Ao root 38 mm, asc Ao 39 mm   BP (!) 155/111   Pulse (!) 115   Ht 6\' 1"  (1.854 m)   Wt 234 lb (106.1 kg)   SpO2 98%   BMI 30.87 kg/m   Opioid Risk Score:   Fall Risk Score:  `1  Depression screen [facility] 2/9     02/21/2023    2:41 PM 08/16/2022    2:54 PM 08/16/2022    2:53 PM 01/01/2019    9:08 AM 07/24/2018   11:40 AM 05/02/2018   10:29 AM 11/02/2016    2:43 PM  Depression screen PHQ 2/9  Decreased Interest 2 2 2  0 0 0 0  Down, Depressed, Hopeless 0 1 1 0 1 0 0  PHQ - 2 Score 2 3 3  0 1 0 0  Altered sleeping 1 3 3   0 2   Tired, decreased energy 2 1 1  1  0   Change in appetite 0 1 1  1  0   Feeling bad or failure about yourself  0 2 2  3  0   Trouble concentrating 0 0 0  0 0   Moving slowly or fidgety/restless 1 0 0  3 0   Suicidal thoughts 0 0 0  0 0   PHQ-9 Score 6 10 10  9 2      Review of Systems  Musculoskeletal:  Positive for gait problem.  Neurological:  Positive for tremors and numbness.       SPASMS  Psychiatric/Behavioral:         ANXIETY  All other systems reviewed and are negative.      Objective:   Physical Exam General No acute distress Mood and affect are appropriate Extremities trace pedal  edema. Negative straight leg raising bilaterally Motor strength 5/5 bilateral hip flexor knee extensor ankle dorsiflexor No pain or swelling in the knees or ankle. Ambulates without assistive device no evidence of toe drag or knee instability Deep tendon reflexes 1+ right knee 0 left knee 1+ right ankle 0 left ankle Sensation intact to pinprick bilateral L3-L4 reduced bilateral L5 and reduced bilateral S1 Lumbar spine no tenderness palpation. Lumbar range of motion 50% flexion extension lateral bending.        Assessment & Plan:   1.  Lumbar spinal stenosis with multiple levels involved namely L3-4 central and foraminal, L4-5 L5-S1 foraminal. From a symptom standpoint he appears to have most symptoms at L5-S1.  He also had previously good results with L5-S1 interlaminar right paramedian which was done when he had primarily right lower extremity radicular discomfort.  He is now having bilateral.  We discussed that we can try an interlaminar but sometimes difficult to get epidural spread bilaterally with that technique.  We discussed the alternative of transforaminal L5-S1 he is in agreement. I contacted his cardiologist Dr. Elberta Fortis to see if we could stop Eliquis for 3 days prior to the procedure and then resume the next day.  He gave the okay. We will get insurance preauthorization he does appear to have failed conservative care which has been ongoing for at least 3 months. Discussed with patient agrees with plan.  He will need a driver the day of the procedure.

## 2023-02-21 NOTE — Patient Instructions (Signed)
You will need a driver for the injection appointment

## 2023-02-22 DIAGNOSIS — G8929 Other chronic pain: Secondary | ICD-10-CM | POA: Diagnosis not present

## 2023-02-23 ENCOUNTER — Ambulatory Visit (INDEPENDENT_AMBULATORY_CARE_PROVIDER_SITE_OTHER): Payer: Medicaid Other

## 2023-02-23 DIAGNOSIS — J309 Allergic rhinitis, unspecified: Secondary | ICD-10-CM

## 2023-02-23 DIAGNOSIS — G8929 Other chronic pain: Secondary | ICD-10-CM | POA: Diagnosis not present

## 2023-02-24 DIAGNOSIS — G8929 Other chronic pain: Secondary | ICD-10-CM | POA: Diagnosis not present

## 2023-02-25 DIAGNOSIS — G8929 Other chronic pain: Secondary | ICD-10-CM | POA: Diagnosis not present

## 2023-02-26 DIAGNOSIS — G8929 Other chronic pain: Secondary | ICD-10-CM | POA: Diagnosis not present

## 2023-02-27 DIAGNOSIS — G8929 Other chronic pain: Secondary | ICD-10-CM | POA: Diagnosis not present

## 2023-02-28 DIAGNOSIS — G8929 Other chronic pain: Secondary | ICD-10-CM | POA: Diagnosis not present

## 2023-03-01 ENCOUNTER — Encounter: Payer: Medicaid Other | Admitting: Orthopedic Surgery

## 2023-03-01 DIAGNOSIS — G8929 Other chronic pain: Secondary | ICD-10-CM | POA: Diagnosis not present

## 2023-03-02 DIAGNOSIS — G8929 Other chronic pain: Secondary | ICD-10-CM | POA: Diagnosis not present

## 2023-03-03 DIAGNOSIS — G8929 Other chronic pain: Secondary | ICD-10-CM | POA: Diagnosis not present

## 2023-03-04 DIAGNOSIS — G8929 Other chronic pain: Secondary | ICD-10-CM | POA: Diagnosis not present

## 2023-03-05 DIAGNOSIS — G8929 Other chronic pain: Secondary | ICD-10-CM | POA: Diagnosis not present

## 2023-03-06 ENCOUNTER — Other Ambulatory Visit: Payer: Self-pay | Admitting: Family Medicine

## 2023-03-06 DIAGNOSIS — E1169 Type 2 diabetes mellitus with other specified complication: Secondary | ICD-10-CM

## 2023-03-06 DIAGNOSIS — G8929 Other chronic pain: Secondary | ICD-10-CM | POA: Diagnosis not present

## 2023-03-07 DIAGNOSIS — G8929 Other chronic pain: Secondary | ICD-10-CM | POA: Diagnosis not present

## 2023-03-07 NOTE — Telephone Encounter (Signed)
Requested Prescriptions  Pending Prescriptions Disp Refills   atorvastatin (LIPITOR) 40 MG tablet [Pharmacy Med Name: ATORVASTATIN 40 MG TABLET] 90 tablet 1    Sig: TAKE 1 TABLET BY MOUTH EVERY DAY     Cardiovascular:  Antilipid - Statins Failed - 03/06/2023  9:30 AM      Failed - Lipid Panel in normal range within the last 12 months    Cholesterol, Total  Date Value Ref Range Status  08/22/2022 156 100 - 199 mg/dL Final   LDL Chol Calc (NIH)  Date Value Ref Range Status  08/22/2022 100 (H) 0 - 99 mg/dL Final   HDL  Date Value Ref Range Status  08/22/2022 29 (L) >39 mg/dL Final   Triglycerides  Date Value Ref Range Status  08/22/2022 149 0 - 149 mg/dL Final         Passed - Patient is not pregnant      Passed - Valid encounter within last 12 months    Recent Outpatient Visits           3 months ago Type 2 diabetes mellitus with other specified complication, without long-term current use of insulin (HCC)   [place] [facility] [place], [place], MD   6 months ago Type 2 diabetes mellitus with other specified complication, without long-term current use of insulin (HCC)   [place] [facility] Hoy Register, MD   11 months ago Ganglion cyst of dorsum of right wrist   [facility] [place], Marylene Land M, [place]   12 months ago COVID-19 virus infection   [place] [facility] [place], [place], MD   1 year ago Type 2 diabetes mellitus with other specified complication, without long-term current use of insulin [facility])   [place] [facility] Hoy Register, MD       Future Appointments             In 2 weeks Tillery, Mariam Dollar, PA-C  [facility]   In 2 months Hoy Register, MD [name]

## 2023-03-08 DIAGNOSIS — G8929 Other chronic pain: Secondary | ICD-10-CM | POA: Diagnosis not present

## 2023-03-09 ENCOUNTER — Ambulatory Visit (INDEPENDENT_AMBULATORY_CARE_PROVIDER_SITE_OTHER): Payer: Medicaid Other

## 2023-03-09 DIAGNOSIS — J309 Allergic rhinitis, unspecified: Secondary | ICD-10-CM | POA: Diagnosis not present

## 2023-03-09 DIAGNOSIS — G8929 Other chronic pain: Secondary | ICD-10-CM | POA: Diagnosis not present

## 2023-03-10 DIAGNOSIS — G8929 Other chronic pain: Secondary | ICD-10-CM | POA: Diagnosis not present

## 2023-03-11 DIAGNOSIS — G8929 Other chronic pain: Secondary | ICD-10-CM | POA: Diagnosis not present

## 2023-03-12 DIAGNOSIS — G8929 Other chronic pain: Secondary | ICD-10-CM | POA: Diagnosis not present

## 2023-03-13 DIAGNOSIS — G8929 Other chronic pain: Secondary | ICD-10-CM | POA: Diagnosis not present

## 2023-03-14 DIAGNOSIS — G8929 Other chronic pain: Secondary | ICD-10-CM | POA: Diagnosis not present

## 2023-03-15 ENCOUNTER — Ambulatory Visit (INDEPENDENT_AMBULATORY_CARE_PROVIDER_SITE_OTHER): Payer: Medicaid Other | Admitting: *Deleted

## 2023-03-15 DIAGNOSIS — J309 Allergic rhinitis, unspecified: Secondary | ICD-10-CM

## 2023-03-15 DIAGNOSIS — G8929 Other chronic pain: Secondary | ICD-10-CM | POA: Diagnosis not present

## 2023-03-16 ENCOUNTER — Ambulatory Visit: Payer: MEDICAID | Admitting: Physical Medicine & Rehabilitation

## 2023-03-16 DIAGNOSIS — G8929 Other chronic pain: Secondary | ICD-10-CM | POA: Diagnosis not present

## 2023-03-16 DIAGNOSIS — J3081 Allergic rhinitis due to animal (cat) (dog) hair and dander: Secondary | ICD-10-CM | POA: Diagnosis not present

## 2023-03-16 NOTE — Progress Notes (Signed)
VIALS EXP 03-15-24

## 2023-03-17 DIAGNOSIS — Z961 Presence of intraocular lens: Secondary | ICD-10-CM | POA: Diagnosis not present

## 2023-03-17 DIAGNOSIS — G8929 Other chronic pain: Secondary | ICD-10-CM | POA: Diagnosis not present

## 2023-03-17 DIAGNOSIS — J3089 Other allergic rhinitis: Secondary | ICD-10-CM | POA: Diagnosis not present

## 2023-03-17 DIAGNOSIS — Z79899 Other long term (current) drug therapy: Secondary | ICD-10-CM | POA: Diagnosis not present

## 2023-03-17 DIAGNOSIS — H30033 Focal chorioretinal inflammation, peripheral, bilateral: Secondary | ICD-10-CM | POA: Diagnosis not present

## 2023-03-17 DIAGNOSIS — H4043X2 Glaucoma secondary to eye inflammation, bilateral, moderate stage: Secondary | ICD-10-CM | POA: Diagnosis not present

## 2023-03-17 DIAGNOSIS — H3581 Retinal edema: Secondary | ICD-10-CM | POA: Diagnosis not present

## 2023-03-18 DIAGNOSIS — G8929 Other chronic pain: Secondary | ICD-10-CM | POA: Diagnosis not present

## 2023-03-19 DIAGNOSIS — G8929 Other chronic pain: Secondary | ICD-10-CM | POA: Diagnosis not present

## 2023-03-20 DIAGNOSIS — G8929 Other chronic pain: Secondary | ICD-10-CM | POA: Diagnosis not present

## 2023-03-21 DIAGNOSIS — G8929 Other chronic pain: Secondary | ICD-10-CM | POA: Diagnosis not present

## 2023-03-22 DIAGNOSIS — G8929 Other chronic pain: Secondary | ICD-10-CM | POA: Diagnosis not present

## 2023-03-22 NOTE — Progress Notes (Unsigned)
Error

## 2023-03-23 ENCOUNTER — Encounter: Payer: Self-pay | Admitting: Student

## 2023-03-23 ENCOUNTER — Ambulatory Visit (INDEPENDENT_AMBULATORY_CARE_PROVIDER_SITE_OTHER): Payer: Medicaid Other

## 2023-03-23 ENCOUNTER — Ambulatory Visit: Payer: Medicaid Other | Attending: Student | Admitting: Student

## 2023-03-23 VITALS — BP 138/88 | HR 89 | Ht 73.0 in | Wt 248.6 lb

## 2023-03-23 DIAGNOSIS — D6869 Other thrombophilia: Secondary | ICD-10-CM | POA: Diagnosis not present

## 2023-03-23 DIAGNOSIS — I10 Essential (primary) hypertension: Secondary | ICD-10-CM | POA: Diagnosis not present

## 2023-03-23 DIAGNOSIS — J309 Allergic rhinitis, unspecified: Secondary | ICD-10-CM | POA: Diagnosis not present

## 2023-03-23 DIAGNOSIS — I480 Paroxysmal atrial fibrillation: Secondary | ICD-10-CM | POA: Diagnosis not present

## 2023-03-23 DIAGNOSIS — G8929 Other chronic pain: Secondary | ICD-10-CM | POA: Diagnosis not present

## 2023-03-23 NOTE — Patient Instructions (Signed)
Medication Instructions:  Your physician recommends that you continue on your current medications as directed. Please refer to the Current Medication list given to you today.  *If you need a refill on your cardiac medications before your next appointment, please call your pharmacy*  Lab Work: None ordered If you have labs (blood work) drawn today and your tests are completely normal, you will receive your results only by: MyChart Message (if you have MyChart) OR A paper copy in the mail If you have any lab test that is abnormal or we need to change your treatment, we will call you to review the results.  Follow-Up: At [facility], you and your health needs are our priority.  As part of our continuing mission to provide you with exceptional heart care, we have created designated Provider Care Teams.  These Care Teams include your primary Cardiologist (physician) and Advanced Practice Providers (APPs -  Physician Assistants and Nurse Practitioners) who all work together to provide you with the care you need, when you need it.  Your next appointment:   6 month(s)  Provider:   Will Camnitz, MD  

## 2023-03-23 NOTE — Progress Notes (Signed)
  Electrophysiology Office Note:   Date:  03/23/2023  ID:  IWAO SUROWIEC, DOB [birth year], MRN [id number]  Primary Cardiologist: Will Jorja Loa, MD Electrophysiologist: Regan Lemming, MD      History of Present Illness:   Jason Stokes is a 62 y.o. male with h/o AF, HTN, and h/o CVA  seen today for routine electrophysiology followup.   Since last being seen in our clinic the patient reports doing well overall.  He was tried on a medication for his eyes and was noted to have palpitations and felt like he was back in AF.  None since. He has mild peripheral edema at the end of the day, wears compression hose. None in the morning. Denies orthopnea or undue SOB. No chest pain.  Review of systems complete and found to be negative unless listed in HPI.   EP Information / Studies Reviewed:    EKG is ordered today. Personal review as below.  EKG Interpretation Date/Time:  Thursday March 23 2023 08:38:56 EDT Ventricular Rate:  85 PR Interval:  142 QRS Duration:  80 QT Interval:  362 QTC Calculation: 430 R Axis:   5  Text Interpretation: Normal sinus rhythm Normal ECG Confirmed by Maxine Glenn [phone]) on 03/23/2023 8:43:04 AM    Arrhythmia history AF ablation 11/2015 Re-do 04/2022 Amiodarone - stopped 07/2022 s/p ablation  Physical Exam:   VS:  BP 138/88   Pulse 89   Ht 6\' 1"  (1.854 m)   Wt 248 lb 9.6 oz (112.8 kg)   SpO2 99%   BMI 32.80 kg/m    Wt Readings from Last 3 Encounters:  03/23/23 248 lb 9.6 oz (112.8 kg)  02/21/23 234 lb (106.1 kg)  01/09/23 246 lb 4.8 oz (111.7 kg)     GEN: Well nourished, well developed in no acute distress NECK: No JVD; No carotid bruits CARDIAC: Regular rate and rhythm, no murmurs, rubs, gallops RESPIRATORY:  Clear to auscultation without rales, wheezing or rhonchi  ABDOMEN: Soft, non-tender, non-distended EXTREMITIES:  No edema; No deformity   ASSESSMENT AND PLAN:    Paroxysmal Atrial Fibrillation  S/p Ablation 11/2015 ,  04/2022 EKG today shows NSR Continue Eliquis for CHA2DS2VASC of at least 3   Only one breakthrough in the setting of a new med, has since stopped   HTN Stable on current regimen    Secondary hypercoagulable state Pt on Eliquis as above   Follow up with Dr. Elberta Fortis in 6 months  Signed, Graciella Freer, PA-C

## 2023-03-24 DIAGNOSIS — G8929 Other chronic pain: Secondary | ICD-10-CM | POA: Diagnosis not present

## 2023-03-25 DIAGNOSIS — G8929 Other chronic pain: Secondary | ICD-10-CM | POA: Diagnosis not present

## 2023-03-26 DIAGNOSIS — G8929 Other chronic pain: Secondary | ICD-10-CM | POA: Diagnosis not present

## 2023-03-27 DIAGNOSIS — G8929 Other chronic pain: Secondary | ICD-10-CM | POA: Diagnosis not present

## 2023-03-28 ENCOUNTER — Encounter: Payer: Medicaid Other | Attending: Physical Medicine & Rehabilitation | Admitting: Physical Medicine & Rehabilitation

## 2023-03-28 ENCOUNTER — Encounter: Payer: Self-pay | Admitting: Physical Medicine & Rehabilitation

## 2023-03-28 VITALS — BP 160/106 | HR 96 | Ht 73.0 in | Wt 234.0 lb

## 2023-03-28 DIAGNOSIS — M5416 Radiculopathy, lumbar region: Secondary | ICD-10-CM | POA: Insufficient documentation

## 2023-03-28 DIAGNOSIS — G8929 Other chronic pain: Secondary | ICD-10-CM | POA: Diagnosis not present

## 2023-03-28 MED ORDER — IOHEXOL 180 MG/ML  SOLN
3.0000 mL | Freq: Once | INTRAMUSCULAR | Status: AC
  Administered 2023-03-28: 3 mL via INTRAVENOUS

## 2023-03-28 MED ORDER — LIDOCAINE HCL 1 % IJ SOLN
5.0000 mL | Freq: Once | INTRAMUSCULAR | Status: AC
Start: 2023-03-28 — End: 2023-03-28
  Administered 2023-03-28: 5 mL

## 2023-03-28 MED ORDER — LIDOCAINE HCL (PF) 1 % IJ SOLN
2.0000 mL | Freq: Once | INTRAMUSCULAR | Status: AC
Start: 2023-03-28 — End: 2023-03-28
  Administered 2023-03-28: 2 mL

## 2023-03-28 MED ORDER — DEXAMETHASONE SODIUM PHOSPHATE 10 MG/ML IJ SOLN
10.0000 mg | Freq: Once | INTRAMUSCULAR | Status: AC
Start: 2023-03-28 — End: 2023-03-28
  Administered 2023-03-28: 20 mg via INTRAVENOUS

## 2023-03-28 NOTE — Progress Notes (Signed)
Bilateral L5-S1 Lumbar transforaminal epidural steroid injection under fluoroscopic guidance with contrast enhancement  Indication: Lumbosacral radiculitis is not relieved by medication management or other conservative care and interfering with self-care and mobility.   Informed consent was obtained after describing risk and benefits of the procedure with the patient, this includes bleeding, bruising, infection, paralysis and medication side effects.  The patient wishes to proceed and has given written consent.  Patient was placed in prone position.  The lumbar area was marked and prepped with Betadine.  It was entered with a 25-gauge 1-1/2 inch needle and one mL of 1% lidocaine was injected into the skin and subcutaneous tissue.  Then a 22-gauge 5" spinal needle was inserted into the Right L5-S1 intervertebral foramen under AP, lateral, and oblique view.  Once needle tip was within the foramen on lateral views an dnor exceeding 6 o clock position on th epedical on AP viewed Isovue 200 was inected x 2ml Then a solution containing one mL of 10 mg per mL dexamethasone and 2 mL of 1% lidocaine was injected.  After the right sided procedure was completed, the left side was targeted using same equipments meds and technique.  The patient tolerated procedure well.  Post procedure instructions were given.  Please see post procedure form.

## 2023-03-28 NOTE — Patient Instructions (Signed)
You received an epidural steroid injection under fluoroscopic guidance. This is the most accurate way to perform an epidural injection. This injection was performed to relieve thigh or leg or foot pain that may be related to a pinched nerve in the lumbar spine. The local anesthetic injected today may cause numbness in your leg for a couple hours. If it is severe we may need to observe you for 30-60 minutes after the injection. The cortisone medicine injected today may take several days to take full effect. This medicine can also cause facial flushing or feeling of being warm.  This injection may last for days weeks or months. It can be repeated if needed. If it is not effective, another spinal level may need to be injected. Other treatments include medication management as well as physical therapy. In some cases surgery may be an option.  

## 2023-03-28 NOTE — Progress Notes (Signed)
  PROCEDURE RECORD [facility]   Name: Jason Stokes DOB:June 15, 1961 MRN: [id number]  Date:03/28/2023  Physician: Claudette Laws, MD    Nurse/CMA: Nataya Bastedo RMA   Allergies:  Allergies  Allergen Reactions   Lisinopril Anaphylaxis    Swelling of lips and tongue.   Tylenol [Acetaminophen] Other (See Comments)    Acid reflux    Lyrica [Pregabalin] Palpitations    Consent Signed: Yes.    Is patient diabetic? No.  CBG today? .  Pregnant: No. LMP: No LMP for male patient. (age 75-55)  Anticoagulants: no Anti-inflammatory: no Antibiotics: no  Procedure: Transforaminal Epidural Steroid Injection  Position: Prone Start Time: 10:54 AM  End Time: 11:02 PM  Fluoro Time: 40  RN/CMA [name] RMA [name] RMA    Time 10:33 AM 11:05 AM    BP 160/106 177/121    Pulse 101 92    Respirations 16 16    O2 Sat 96 95    S/S 6 6    Pain Level 9/10 6/10     D/C home with [name], patient A & O X 3, D/C instructions reviewed, and sits independently.

## 2023-03-29 DIAGNOSIS — G8929 Other chronic pain: Secondary | ICD-10-CM | POA: Diagnosis not present

## 2023-03-30 DIAGNOSIS — G8929 Other chronic pain: Secondary | ICD-10-CM | POA: Diagnosis not present

## 2023-03-31 DIAGNOSIS — G8929 Other chronic pain: Secondary | ICD-10-CM | POA: Diagnosis not present

## 2023-04-01 DIAGNOSIS — G8929 Other chronic pain: Secondary | ICD-10-CM | POA: Diagnosis not present

## 2023-04-02 DIAGNOSIS — G8929 Other chronic pain: Secondary | ICD-10-CM | POA: Diagnosis not present

## 2023-04-03 DIAGNOSIS — G8929 Other chronic pain: Secondary | ICD-10-CM | POA: Diagnosis not present

## 2023-04-03 NOTE — Progress Notes (Signed)
   Telephone Visit- Progress Note: Referring Physician:  No referring provider defined for this encounter.  Primary Physician:  Hoy Register, MD  This visit was performed via telephone.  Patient location: home Provider location: office  I spent a total of 10 minutes non-face-to-face activities for this visit on the date of this encounter including review of current clinical condition and response to treatment.    Patient has given verbal consent to this telephone visits and we reviewed the limitations of a telephone visit. Patient wishes to proceed.    Chief Complaint:  follow up  History of Present Illness: MCDANIEL STONEBURG is a 62 y.o. male has a history of HTN, afib, stroke, migraine, asthma, GERD, bipolar, schizophrenia.    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 Also with bilateral recess stenosis and foraminal stenosis at L4-L5 with mild central stenosis and mild bilateral foraminal stenosis L5-S1. Primary pain generator is likely L4-L5.     He had bilateral L5-S1 TF ESI on 03/28/23 with Dr. Bertram Gala.    Phone visit to check on his progress after above injection.   He has seen at least 50% improvement after his ESI. He still has constant LBP that is more of a stiffness. His right leg pain is intermittent and the burning and tingling pain is better. No left leg pain, he still has some pain/numbness in left great toe. He is pleased with his results.   He had recent trigger finger release. He is still wanting to schedule his left shoulder surgery.   He would like to start PT for his back at [facility] in [place].    He is on ELIQUIS.    He smokes occasionally.    Bowel/Bladder Dysfunction: none   Conservative measures:  Physical therapy: did PT for his back 2 years ago with relief Multimodal medical therapy including regular antiinflammatories: robaxin, oxycodone  Injections: bilateral L5-S1 TF ESI on  03/28/23 with Dr. Bertram Gala right L5-S1 interlaminar ESI 06/01/21 and 12/29/20.    Past Surgery: no spinal surgery    Exam: No exam done as this was a telephone encounter.     Imaging: none    Assessment and Plan: Mr. Jarosz is a pleasant 62 y.o. male who has seen at least 50% improvement after his ESI. He still has constant LBP that is more of a stiffness. His right leg pain is intermittent and the burning and tingling pain is better. No left leg pain, he still has some pain/numbness in left great toe.    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 Also with bilateral recess stenosis and foraminal stenosis at L4-L5 with mild central stenosis and mild bilateral foraminal stenosis L5-S1. Primary pain generator is likely L4-L5.     Treatment options discussed with patient and following plan made:   - PT orders for lumbar spine sent to [facility] in [place].  - He will contact Dr. Wynn Banker if he wants another ESI. - He would like to follow up with me prn. He will call with concerns.   Drake Leach PA-C Neurosurgery

## 2023-04-04 ENCOUNTER — Ambulatory Visit (INDEPENDENT_AMBULATORY_CARE_PROVIDER_SITE_OTHER): Payer: Self-pay | Admitting: *Deleted

## 2023-04-04 DIAGNOSIS — G8929 Other chronic pain: Secondary | ICD-10-CM | POA: Diagnosis not present

## 2023-04-04 DIAGNOSIS — J309 Allergic rhinitis, unspecified: Secondary | ICD-10-CM

## 2023-04-05 DIAGNOSIS — G8929 Other chronic pain: Secondary | ICD-10-CM | POA: Diagnosis not present

## 2023-04-06 DIAGNOSIS — G8929 Other chronic pain: Secondary | ICD-10-CM | POA: Diagnosis not present

## 2023-04-07 ENCOUNTER — Encounter: Payer: Self-pay | Admitting: Orthopedic Surgery

## 2023-04-07 ENCOUNTER — Ambulatory Visit (INDEPENDENT_AMBULATORY_CARE_PROVIDER_SITE_OTHER): Payer: Medicaid Other | Admitting: Orthopedic Surgery

## 2023-04-07 DIAGNOSIS — M5136 Other intervertebral disc degeneration, lumbar region: Secondary | ICD-10-CM

## 2023-04-07 DIAGNOSIS — G8929 Other chronic pain: Secondary | ICD-10-CM | POA: Diagnosis not present

## 2023-04-07 DIAGNOSIS — M47816 Spondylosis without myelopathy or radiculopathy, lumbar region: Secondary | ICD-10-CM

## 2023-04-07 DIAGNOSIS — M5416 Radiculopathy, lumbar region: Secondary | ICD-10-CM

## 2023-04-07 DIAGNOSIS — M4726 Other spondylosis with radiculopathy, lumbar region: Secondary | ICD-10-CM

## 2023-04-07 DIAGNOSIS — M48061 Spinal stenosis, lumbar region without neurogenic claudication: Secondary | ICD-10-CM

## 2023-04-07 NOTE — Addendum Note (Signed)
Addended byDrake Leach on: 04/07/2023 08:19 AM   Modules accepted: Orders

## 2023-04-08 DIAGNOSIS — G8929 Other chronic pain: Secondary | ICD-10-CM | POA: Diagnosis not present

## 2023-04-09 DIAGNOSIS — G8929 Other chronic pain: Secondary | ICD-10-CM | POA: Diagnosis not present

## 2023-04-10 DIAGNOSIS — G8929 Other chronic pain: Secondary | ICD-10-CM | POA: Diagnosis not present

## 2023-04-11 DIAGNOSIS — G8929 Other chronic pain: Secondary | ICD-10-CM | POA: Diagnosis not present

## 2023-04-12 ENCOUNTER — Telehealth: Payer: Self-pay

## 2023-04-12 DIAGNOSIS — G8929 Other chronic pain: Secondary | ICD-10-CM | POA: Diagnosis not present

## 2023-04-12 NOTE — Telephone Encounter (Signed)
Copied from CRM [id number]. Topic: Referral - Request for Referral >> Apr 12, 2023  8:38 AM Everette C wrote: Has patient seen PCP for this complaint? Yes.   *If NO, is insurance requiring patient see PCP for this issue before PCP can refer them? Referral for which specialty: Dermatology  Preferred provider/office: Patient has no preference but would like them in [facility]  Reason for referral: rash/skin irritation

## 2023-04-13 ENCOUNTER — Ambulatory Visit (INDEPENDENT_AMBULATORY_CARE_PROVIDER_SITE_OTHER): Payer: Medicaid Other | Admitting: *Deleted

## 2023-04-13 DIAGNOSIS — J309 Allergic rhinitis, unspecified: Secondary | ICD-10-CM

## 2023-04-13 DIAGNOSIS — G8929 Other chronic pain: Secondary | ICD-10-CM | POA: Diagnosis not present

## 2023-04-14 DIAGNOSIS — G8929 Other chronic pain: Secondary | ICD-10-CM | POA: Diagnosis not present

## 2023-04-15 DIAGNOSIS — G8929 Other chronic pain: Secondary | ICD-10-CM | POA: Diagnosis not present

## 2023-04-16 DIAGNOSIS — G8929 Other chronic pain: Secondary | ICD-10-CM | POA: Diagnosis not present

## 2023-04-17 DIAGNOSIS — G8929 Other chronic pain: Secondary | ICD-10-CM | POA: Diagnosis not present

## 2023-04-18 DIAGNOSIS — G8929 Other chronic pain: Secondary | ICD-10-CM | POA: Diagnosis not present

## 2023-04-19 DIAGNOSIS — G8929 Other chronic pain: Secondary | ICD-10-CM | POA: Diagnosis not present

## 2023-04-20 ENCOUNTER — Encounter: Payer: Self-pay | Admitting: Family Medicine

## 2023-04-20 ENCOUNTER — Ambulatory Visit: Payer: Medicaid Other | Attending: Family Medicine | Admitting: Family Medicine

## 2023-04-20 DIAGNOSIS — G8929 Other chronic pain: Secondary | ICD-10-CM | POA: Diagnosis not present

## 2023-04-20 DIAGNOSIS — M25512 Pain in left shoulder: Secondary | ICD-10-CM | POA: Diagnosis not present

## 2023-04-20 DIAGNOSIS — M5416 Radiculopathy, lumbar region: Secondary | ICD-10-CM | POA: Diagnosis not present

## 2023-04-20 DIAGNOSIS — R21 Rash and other nonspecific skin eruption: Secondary | ICD-10-CM | POA: Diagnosis not present

## 2023-04-20 DIAGNOSIS — M1711 Unilateral primary osteoarthritis, right knee: Secondary | ICD-10-CM | POA: Diagnosis not present

## 2023-04-20 DIAGNOSIS — M65341 Trigger finger, right ring finger: Secondary | ICD-10-CM | POA: Diagnosis not present

## 2023-04-20 NOTE — Progress Notes (Signed)
Virtual Visit via Telephone Note  I connected with Jason Stokes, on 04/20/2023 at 8:16 AM by telephone and verified that I am speaking with the correct person using two identifiers.   Consent: I discussed the limitations, risks, security and privacy concerns of performing an evaluation and management service by telephone and the availability of in person appointments. I also discussed with the patient that there may be a patient responsible charge related to this service. The patient expressed understanding and agreed to proceed.   Location of Patient: Home  Location of Provider: Clinic   Persons participating in Telemedicine visit: Jason Stokes Dr. Alvis Lemmings     History of Present Illness: Jason Stokes is a 62 y.o. year old male with a history of hypertension, type 2 diabetes mellitus (diet-controlled A1c 8.1), bipolar disorder, paroxysmal A. 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  He presents with ongoing right hand pain and a skin rash. He reports that his hand surgery, performed earlier in the month, did not yield the expected results. The patient's hand has not healed properly, and he is experiencing difficulty gripping objects due to pain and a burning sensation. He is also experiencing issues with his left shoulder, which is stiff and requires surgery.  The patient also mentions that he needs a knee replacement for his right knee. He therefore requires PCS services to assist with ADLs  In addition to these issues, the patient is also experiencing a skin rash. He reports breaking out in bumps all over his face, back, and legs. The rash is not itchy or painful, but it does produce pus. The patient has  been using over-the-counter creams and has changed soap, but these measures have not been effective. The rash clears up every four to five months, only to return. He would like a Dermatology referral.        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)    a.) s/p embolization 08/19/2020: 29 mm RIGHT internal iliac artery aneurysm   Anxiety    Aortic atherosclerosis (HCC)    Bilateral carpal tunnel syndrome 01/10/2018   Bipolar disorder (HCC)    Chorioretinal inflammation of both eyes    a.) on azothioprine   Chronic lower back pain    Coronary artery calcification seen on CT scan    a.) cCTA 04/21/2022: Ca score 20.6 (61st percentile for age/sex match control)   DDD (degenerative disc disease), cervical    Depression    Diastolic dysfunction    a.) TTE 01/13/2021: EF 60-65%, mod LVH, triv MR, G1DD   GERD (gastroesophageal reflux disease)    Hepatic steatosis    History of alcohol abuse    History of nuclear stress test    Myoview 10/16: EF 50%, diaphragmatic attenuation, no ischemia, low risk   Hypertension    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   a.) CT head 12/25/2012 --> RIGHT basal ganglia hypoattenuation related to remote lacunar infarct   Lipoma    Long term (current) use of anticoagulants    a.) apixaban   Long-term current use of immunomodulator    a.) on azothioprine for peripheral focal chorioretinal inflammation (both eyes)   Marijuana use  Migraine    Moderate persistent asthma with acute exacerbation 05/02/2018   PAF (paroxysmal atrial fibrillation) (HCC) 03/27/2015   a.) CHA2DS2VASc = 5 (HTN, CVA x2, vascular disease history, T2DM);  b.) s/p ablation 10/02/2015; c.) s/p ablation 12/10/2015; d.) s/p DCCV (200 J x 1) 12/11/2015; e.) s/p ablation 04/28/2022; f.) rate/rhythm maintained on oral diltiazem + carvedilol; chronically anticoagulated with apixaban   Pilonidal cyst    Schizophrenia (HCC)    Sciatica  neuralgia    T2DM (type 2 diabetes mellitus) (HCC)    Thoracic aortic ectasia (HCC) 01/13/2021   a.) TTE 01/13/2021: Ao root 38 mm, asc Ao 39 mm   Allergies  Allergen Reactions   Lisinopril Anaphylaxis    Swelling of lips and tongue.   Tylenol [Acetaminophen] Other (See Comments)    Acid reflux    Lyrica [Pregabalin] Palpitations    Current Outpatient Medications on File Prior to Visit  Medication Sig Dispense Refill   albuterol (VENTOLIN HFA) 108 (90 Base) MCG/ACT inhaler Inhale 2 puffs into the lungs every 4 (four) hours as needed for wheezing or shortness of breath. 54 g 0   apixaban (ELIQUIS) 5 MG TABS tablet Take 1 tablet (5 mg total) by mouth 2 (two) times daily. 60 tablet 5   atorvastatin (LIPITOR) 40 MG tablet TAKE 1 TABLET BY MOUTH EVERY DAY 90 tablet 1   azaTHIOprine (IMURAN) 50 MG tablet Take 100 mg by mouth every morning.     azelastine (ASTELIN) 0.1 % nasal spray Place 1 spray into both nostrils 2 (two) times daily. 1 spray each nostril twice a day 90 mL 1   brimonidine (ALPHAGAN) 0.2 % ophthalmic solution Place 1 drop into both eyes every 8 (eight) hours.     carvedilol (COREG) 25 MG tablet Take 1 tablet (25 mg total) by mouth 2 (two) times daily. 180 tablet 2   cetirizine (ZYRTEC) 10 MG tablet Take 1 tablet (10 mg total) by mouth daily as needed for allergies (Can take an extra dose during flare ups.). 180 tablet 1   cromolyn (OPTICROM) 4 % ophthalmic solution Place 1 drop into both eyes 4 (four) times daily as needed. 10 mL 5   Difluprednate 0.05 % EMUL Place 1 drop into both eyes 2 (two) times daily.     diltiazem (CARDIZEM CD) 180 MG 24 hr capsule TAKE 1 CAPSULE BY MOUTH EVERY DAY (Patient taking differently: 180 mg every morning. TAKE 1 CAPSULE BY MOUTH EVERY DAY) 90 capsule 2   dorzolamide-timolol (COSOPT) 22.3-6.8 MG/ML ophthalmic solution Place 1 drop into both eyes 2 (two) times daily.     DULoxetine (CYMBALTA) 60 MG capsule Take 1 capsule (60 mg total) by mouth  daily. For chronic pain and neuropathy 90 capsule 1   EPINEPHrine 0.3 mg/0.3 mL IJ SOAJ injection Inject 0.3 mg into the muscle as needed for anaphylaxis. 0.3 mL 1   fluticasone (FLONASE) 50 MCG/ACT nasal spray 1 spray each nostril twice a day (Patient taking differently: Place 1 spray into both nostrils as needed. 1 spray each nostril twice a day) 16 g 5   fluticasone furoate-vilanterol (BREO ELLIPTA) 200-25 MCG/ACT AEPB Inhale 1 puff into the lungs daily. 180 each 1   folic acid (FOLVITE) 1 MG tablet Take 1 mg by mouth daily.     furosemide (LASIX) 20 MG tablet Take 1 tablet (20 mg total) by mouth daily. 30 tablet 3   GARLIC PO Take 1 tablet by mouth daily.     glucose blood (ACCU-CHEK GUIDE)  test strip Use to check blood sugar once daily. E11.69 100 each 6   ipratropium (ATROVENT) 0.03 % nasal spray Place 2 sprays into both nostrils 4 (four) times daily as needed for rhinitis. 30 mL 5   latanoprost (XALATAN) 0.005 % ophthalmic solution Place 1 drop into both eyes daily.     metFORMIN (GLUCOPHAGE) 500 MG tablet Take 1 tablet (500 mg total) by mouth 2 (two) times daily with a meal. For 1 week then increase to 2 tablets twice a day 360 tablet 1   methocarbamol (ROBAXIN) 500 MG tablet TAKE 1 TABLET BY MOUTH EVERY 8 HOURS AS NEEDED FOR MUSCLE SPASM 30 tablet 0   Misc. Devices MISC Back brace. Diagnosis chronic back pain 1 each 0   Misc. Devices MISC Left knee brace. Diagnosis L knee pain 1 each 0   montelukast (SINGULAIR) 10 MG tablet Take 1 tablet (10 mg total) by mouth at bedtime. 90 tablet 1   Olopatadine HCl (PATADAY) 0.2 % SOLN Place 1 drop into both eyes daily as needed. 7.5 mL 1   omeprazole (PRILOSEC) 20 MG capsule TAKE 1 CAPSULE BY MOUTH EVERY DAY (Patient taking differently: 20 mg every morning. TAKE 1 CAPSULE BY MOUTH EVERY DAY) 90 capsule 0   oxyCODONE 10 MG TABS Take 1 tablet (10 mg total) by mouth every 4 (four) hours as needed for severe pain. 15 tablet 0   Vitamin D3 (VITAMIN D) 25  MCG tablet Take 1,000 Units by mouth daily.     No current facility-administered medications on file prior to visit.    ROS: See HPI  Observations/Objective: Awake, alert, oriented x3 Not in acute distress Normal mood      Latest Ref Rng & Units 06/23/2022   10:57 AM 05/26/2022    2:53 PM 04/15/2022   12:26 PM  CMP  Glucose 70 - 99 mg/dL 161  096  045   BUN 8 - 23 mg/dL 5  8  14    Creatinine 0.61 - 1.24 mg/dL 4.09  8.11  9.14   Sodium 135 - 145 mmol/L 140  134  141   Potassium 3.5 - 5.1 mmol/L 3.5  3.8  3.9   Chloride 98 - 111 mmol/L 110  100  103   CO2 22 - 32 mmol/L 22  22  24    Calcium 8.9 - 10.3 mg/dL 8.9  9.2  9.6   Total Protein 6.5 - 8.1 g/dL 6.4  6.9    Total Bilirubin 0.3 - 1.2 mg/dL 0.4  1.0    Alkaline Phos 38 - 126 U/L 110  466    AST 15 - 41 U/L 16  52    ALT 0 - 44 U/L 13  69      Lipid Panel     Component Value Date/Time   CHOL 156 08/22/2022 1110   TRIG 149 08/22/2022 1110   HDL 29 (L) 08/22/2022 1110   CHOLHDL 5.2 (H) 05/07/2018 1004   CHOLHDL 5.4 10/17/2013 1042   VLDL 36 10/17/2013 1042   LDLCALC 100 (H) 08/22/2022 1110   LABVLDL 27 08/22/2022 1110    Lab Results  Component Value Date   HGBA1C 6.0 11/15/2022    Assessment and Plan:     Right Hand Trigger Finger Post-operative complications with pain, decreased grip strength, and recurrence of locking. Surgery performed by Dr. Frazier Butt at [facility] earlier this month. -Continue follow-up with Dr. Frazier Butt for management.  Left Shoulder Pain Planned surgery for left shoulder pain. Currently  managed by [facility]. -Continue with planned surgery and pre-operative therapy.  Right Knee Pain Patient reports needing a right knee replacement. -Continue current management plan.  Skin Rash Patient reports recurrent skin rash with pustules. Over-the-counter treatments and soap changes have not been effective. -An office visit will be needed to determine appropriate treatment  course -Refer to dermatology for further evaluation and management per patient request  Home Care Patient has ongoing home care, but due to a change in insurance company, a new form needs to be completed. -Complete and submit form for continued home care.  Follow-up Scheduled for end of the month. -Continue with planned follow-up.          I discussed the assessment and treatment plan with the patient. The patient was provided an opportunity to ask questions and all were answered. The patient agreed with the plan and demonstrated an understanding of the instructions.   The patient was advised to call back or seek an in-person evaluation if the symptoms worsen or if the condition fails to improve as anticipated.     I provided 14 minutes total of non-face-to-face time during this encounter.   Hoy Register, MD, FAAFP. [facility] [place], [place] [phone]   04/20/2023, 8:16 AM

## 2023-04-21 ENCOUNTER — Ambulatory Visit (INDEPENDENT_AMBULATORY_CARE_PROVIDER_SITE_OTHER): Payer: Medicaid Other | Admitting: *Deleted

## 2023-04-21 DIAGNOSIS — G8929 Other chronic pain: Secondary | ICD-10-CM | POA: Diagnosis not present

## 2023-04-21 DIAGNOSIS — J309 Allergic rhinitis, unspecified: Secondary | ICD-10-CM

## 2023-04-22 DIAGNOSIS — G8929 Other chronic pain: Secondary | ICD-10-CM | POA: Diagnosis not present

## 2023-04-23 DIAGNOSIS — G8929 Other chronic pain: Secondary | ICD-10-CM | POA: Diagnosis not present

## 2023-04-24 DIAGNOSIS — G8929 Other chronic pain: Secondary | ICD-10-CM | POA: Diagnosis not present

## 2023-04-25 ENCOUNTER — Encounter: Payer: Self-pay | Admitting: Physical Therapy

## 2023-04-25 ENCOUNTER — Ambulatory Visit: Payer: Medicaid Other | Attending: Orthopedic Surgery | Admitting: Physical Therapy

## 2023-04-25 ENCOUNTER — Other Ambulatory Visit: Payer: Self-pay

## 2023-04-25 DIAGNOSIS — R2689 Other abnormalities of gait and mobility: Secondary | ICD-10-CM | POA: Insufficient documentation

## 2023-04-25 DIAGNOSIS — M51369 Other intervertebral disc degeneration, lumbar region without mention of lumbar back pain or lower extremity pain: Secondary | ICD-10-CM | POA: Insufficient documentation

## 2023-04-25 DIAGNOSIS — M47816 Spondylosis without myelopathy or radiculopathy, lumbar region: Secondary | ICD-10-CM | POA: Diagnosis not present

## 2023-04-25 DIAGNOSIS — M5459 Other low back pain: Secondary | ICD-10-CM | POA: Insufficient documentation

## 2023-04-25 DIAGNOSIS — M48061 Spinal stenosis, lumbar region without neurogenic claudication: Secondary | ICD-10-CM | POA: Diagnosis not present

## 2023-04-25 DIAGNOSIS — M5416 Radiculopathy, lumbar region: Secondary | ICD-10-CM | POA: Diagnosis not present

## 2023-04-25 DIAGNOSIS — G8929 Other chronic pain: Secondary | ICD-10-CM | POA: Diagnosis not present

## 2023-04-25 DIAGNOSIS — M6281 Muscle weakness (generalized): Secondary | ICD-10-CM | POA: Diagnosis present

## 2023-04-25 NOTE — Therapy (Signed)
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)    a.) s/p embolization 08/19/2020: 29 mm RIGHT internal iliac artery aneurysm   Anxiety    Aortic atherosclerosis (HCC)    Bilateral carpal tunnel syndrome 01/10/2018   Bipolar disorder (HCC)    Chorioretinal inflammation of both eyes    a.) on azothioprine   Chronic lower back pain    Coronary artery calcification seen on CT scan    a.) cCTA 04/21/2022: Ca score 20.6 (61st percentile for age/sex match control)   DDD (degenerative disc disease), cervical    Depression    Diastolic dysfunction    a.) TTE 01/13/2021: EF 60-65%, mod LVH, triv MR, G1DD   GERD (gastroesophageal reflux disease)    Hepatic steatosis    History of alcohol abuse    History of nuclear stress test    Myoview 10/16: EF 50%, diaphragmatic attenuation, no ischemia, low risk   Hypertension    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   a.) CT head 12/25/2012 --> RIGHT basal ganglia hypoattenuation related to remote lacunar infarct   Lipoma    Long term (current) use of anticoagulants    a.) apixaban   Long-term current use of immunomodulator    a.) on azothioprine for peripheral focal chorioretinal inflammation (both eyes)   Marijuana use    Migraine    Moderate persistent asthma with acute exacerbation 05/02/2018   PAF (paroxysmal atrial fibrillation) (HCC) 03/27/2015   a.) CHA2DS2VASc = 5 (HTN,  CVA x2, vascular disease history, T2DM);  b.) s/p ablation 10/02/2015; c.) s/p ablation 12/10/2015; d.) s/p DCCV (200 J x 1) 12/11/2015; e.) s/p ablation 04/28/2022; f.) rate/rhythm maintained on oral diltiazem + carvedilol; chronically anticoagulated with apixaban   Pilonidal cyst    Schizophrenia (HCC)    Sciatica neuralgia    T2DM (type 2 diabetes mellitus) (HCC)    Thoracic aortic ectasia (HCC) 01/13/2021   a.) TTE 01/13/2021: Ao root 38 mm, asc Ao 39 mm   Past Surgical History:  Procedure Laterality Date   ATRIAL FIBRILLATION ABLATION  09/22/2015   ATRIAL FIBRILLATION ABLATION N/A 04/28/2022   Procedure: ATRIAL FIBRILLATION ABLATION;  Surgeon: Regan Lemming, MD;  Location: [facility];  Service: Cardiovascular;  Laterality: N/A;   CATARACT EXTRACTION Bilateral    CYST EXCISION  1996-97   surgery back of head    ELECTROPHYSIOLOGIC STUDY N/A 09/22/2015   Procedure: Atrial Fibrillation Ablation;  Surgeon: Will Jorja Loa, MD;  Location: [facility];  Service: Cardiovascular;  Laterality: N/A;   ELECTROPHYSIOLOGIC STUDY N/A 12/10/2015   Procedure: Atrial Fibrillation Ablation;  Surgeon: Will Jorja Loa, MD;  Location: [facility];  Service: Cardiovascular;  Laterality: N/A;   ELECTROPHYSIOLOGIC STUDY N/A 12/11/2015   Procedure: Cardioversion;  Surgeon: Will Jorja Loa, MD;  Location: [facility];  Service: Cardiovascular;  Laterality: N/A;   EMBOLIZATION (CATH LAB) Right 08/19/2020   Procedure: EMBOLIZATION;  Surgeon: Leonie Douglas, MD;  Location: [facility];  Service: Cardiovascular;  Laterality: Right;  hypogastric   EXCISION MASS HEAD N/A 01/06/2017   Procedure: EXCISION MASS FOREHEAD;  Surgeon: Glenna Fellows, MD;  Location: [facility];  Service: Plastics;  Laterality: N/A;   EXCISION MASS UPPER EXTREMETIES Right 08/08/2022   Procedure: EXCISION MASS RIGHT FOREARM;  Surgeon: Betha Loa, MD;  Location: [facility];  Service: Orthopedics;  Laterality: Right;  45 MIN   GANGLION CYST EXCISION Left    INTERCOSTAL NERVE BLOCK  07/19/2003   KNEE ARTHROSCOPY Right 07/18/2014   MASS EXCISION N/A 01/25/2021   Procedure: EXCISION SUBCUTANEOUS VS SUBFASCIAL MASS TORSO 3CM;  Surgeon: Glenna Fellows, MD;  Location: [facility];  Service: Plastics;  Laterality: N/A;   PILONIDAL CYST EXCISION N/A 09/13/2022   Procedure: CYST EXCISION PILONIDAL EXTENSIVE;  Surgeon: Henrene Dodge, MD;  Location: [facility];  Service: General;  Laterality: N/A;   Patient Active Problem List   Diagnosis Date Noted   Complete tear of left rotator cuff 10/28/2022   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   Pilonidal cyst 09/13/2022   Aortic atherosclerosis (HCC) 06/07/2022   Hypercoagulable state due to paroxysmal atrial fibrillation (HCC) 03/23/2021   Type 2 diabetes mellitus with hyperglycemia, without long-term current use of insulin (HCC) 02/10/2021   Neck pain on left side 03/06/2019   Musculoskeletal chest pain 07/24/2018   Asthma, mild intermittent 05/02/2018   Allergic rhinitis caused by mold 05/02/2018   Tobacco use 05/02/2018   Bilateral carpal tunnel syndrome 01/10/2018   Stroke (HCC)    Schizophrenia (HCC)    Migraine    History of nuclear stress test    History of alcohol abuse    GERD (gastroesophageal reflux disease)    Dysrhythmia    Chronic lower back pain    Anxiety    Arthritis of knee, right 04/12/2017   Bipolar disorder (HCC) 04/03/2017   Lipoma of forehead 09/22/2016   Trigger ring finger of right hand 06/22/2016   Paroxysmal atrial fibrillation (HCC)    AF (atrial fibrillation) (HCC) 12/10/2015   Eczema 07/10/2015   History of CVA (cerebrovascular accident) 05/04/2015   Tear of medial meniscus of right knee 03/18/2015   Chronic pain of right knee 03/12/2015   Other and unspecified hyperlipidemia 11/25/2013   Nasal congestion 11/25/2013   Sinusitis, chronic  05/09/2013   Chronic low back pain 10/12/2012   Lumbar radiculopathy 10/12/2012   Hypertension 10/12/2012   Depression 10/12/2012   Insomnia 10/12/2012    PCP: Hoy Register, MD  REFERRING PROVIDER: Drake Leach, PA-C  REFERRING DIAG: Lumbar spondylosis; Spinal stenosis of lumbar region, unspecified whether neurogenic claudication present; Lumbar radiculopathy; DDD (degenerative disc disease), lumbar  Rationale for Evaluation and Treatment: Rehabilitation  THERAPY DIAG:  Other low back pain  Muscle weakness (generalized)  Other abnormalities of gait and mobility  ONSET DATE: Chronic   SUBJECTIVE:  SUBJECTIVE STATEMENT: Patient reports lower back pain, he is wearing a back brace. He did have an injection that helped for about a week so is going to have it done again. He reports that his back is feeling stiff and the left sciatic nerve is hurting but the right side is worse. Having burning, tingling, numbness down both legs and pain in the lower back area. He states the pain stays between a 9-10/10 pain.   PERTINENT HISTORY:  See PMH above  PAIN:  Are you having pain? Yes:  NPRS scale: 9-10/10 Pain location: Lower back, down both legs Pain description: Burning, tingling, numbness Aggravating factors: Sitting, walking, standing, bending Relieving factors: Nothing  PRECAUTIONS: None  RED FLAGS: None   WEIGHT BEARING RESTRICTIONS: No  FALLS:  Has patient fallen in last 6 months? Yes. Number of falls 1 about 4 months ago when his knee went out  PLOF: Independent with basic ADLs  PATIENT GOALS: Pain relief   OBJECTIVE:  Note: Objective measures were completed at Evaluation unless otherwise noted. PATIENT SURVEYS:  FOTO 59% functional status  COGNITION: Overall cognitive status:  Within functional limits for tasks assessed     SENSATION: Patient reports generalized sensation changes throughout both legs  MUSCLE LENGTH: Significant hamstring flexibility deficits bilaterally  POSTURE:  Rounded shoulder and forward head posture, he demonstrates trunk lean to right and consistently shift weight due to pain   PALPATION: Generalized tender to palpation bilateral lumbar paraspinals, gluteal region; lumbar spine CPA limited throughout  LUMBAR ROM:   AROM eval  Flexion 25%  Extension 0%  Right lateral flexion 25%  Left lateral flexion 25%  Right rotation 25%  Left rotation 25%   (Blank rows = not tested)  LOWER EXTREMITY ROM:      Hip PROM grossly WFL with report of muscular tightness  LOWER EXTREMITY MMT:    MMT Right eval Left eval  Hip flexion 4- 4-  Hip extension 2 2  Hip abduction 2 2  Hip adduction    Hip internal rotation    Hip external rotation    Knee flexion 4 4  Knee extension 4 4  Ankle dorsiflexion    Ankle plantarflexion    Ankle inversion    Ankle eversion     (Blank rows = not tested)  LUMBAR SPECIAL TESTS:  Lumbar radicular testing positive bilaterally  FUNCTIONAL TESTS:  30 seconds chair stand test: 6 reps with use of UE for support  GAIT: Assistive device utilized: Single point cane Level of assistance: Modified independence Comments: Decreased gait speed, antalgic on the right   TODAY'S TREATMENT:      [facility] Adult PT Treatment:                                                DATE: 04/25/2023 Therapeutic Exercise: LTR 3 x 5 sec each SKTC stretch 2 x 20 sec each Piriformis stretch 2 x 20 sec each SLR partial range x 5 each Bridge x 5 Seated hamstring stretch 2 x 20 sec each  PATIENT EDUCATION:  Education details: Exam findings, POC, HEP Person educated: Patient Education method: Explanation, Demonstration, Tactile cues, Verbal cues, and Handouts Education comprehension: verbalized understanding, returned  demonstration, verbal cues required, tactile cues required, and needs further education  HOME EXERCISE PROGRAM: Access Code: [email or web address]   ASSESSMENT: CLINICAL IMPRESSION: Patient is a 62  y.o. male who was seen today for physical therapy evaluation and treatment for chronic lower back and bilateral radicular pain. He demonstrates significant limitations in his lumbar mobility and general flexibility of the hips and LE. He exhibit gross strength deficits of the core and hip region. All impairments are impacting his functional ability.    OBJECTIVE IMPAIRMENTS: Abnormal gait, decreased activity tolerance, decreased balance, difficulty walking, decreased ROM, decreased strength, hypomobility, increased muscle spasms, impaired flexibility, impaired sensation, postural dysfunction, and pain.   ACTIVITY LIMITATIONS: carrying, lifting, bending, sitting, standing, squatting, transfers, bed mobility, dressing, and locomotion level  PARTICIPATION LIMITATIONS: meal prep, cleaning, laundry, driving, shopping, and community activity  PERSONAL FACTORS: Fitness, Past/current experiences, Time since onset of injury/illness/exacerbation, and 3+ comorbidities: see PMH above  are also affecting patient's functional outcome.   REHAB POTENTIAL: Fair due to chronicity and severity of symptoms  CLINICAL DECISION MAKING: Evolving/moderate complexity  EVALUATION COMPLEXITY: Moderate   GOALS: Goals reviewed with patient? Yes  SHORT TERM GOALS: Target date: 05/23/2023  Patient will be I with initial HEP in order to progress with therapy. Baseline: HEP provided at eval Goal status: INITIAL  2.  Patient will report lower back and leg pain </= 7/10 in order to reduce functional limitations Baseline: 9-10/10 pain Goal status: INITIAL  LONG TERM GOALS: Target date: 06/20/2023  Patient will be I with final HEP to maintain progress from PT. Baseline: HEP provided at eval Goal status: INITIAL  2.  Patient will  report >/= 66% status on FOTO to indicate improved functional ability. Baseline: 59% functional status Goal status: INITIAL  3.  Patient will be able to walk >/= 30 minutes without requiring rest due to pain in order to improve community access  Baseline: patient only able to walk short distances due to pain  Goal status: INITIAL  4.  Patient will report </= 4/10 pain level with household activity and ambulation to reduce functional limitation   Baseline: 9-10/10 pain Goal status: INITIAL  5.  Patient will demonstrate gluteal strength >/= 4-/5 MMT in order to improve standing and walking tolerance  Baseline: 2/5 MMT  Goal status: INITIAL   PLAN: PT FREQUENCY: 1-2x/week  PT DURATION: 8 weeks  PLANNED INTERVENTIONS: Therapeutic exercises, Therapeutic activity, Neuromuscular re-education, Balance training, Gait training, Patient/Family education, Self Care, Joint mobilization, Joint manipulation, Aquatic Therapy, Dry Needling, Electrical stimulation, Spinal manipulation, Spinal mobilization, Cryotherapy, Moist heat, Taping, Traction, Ionotophoresis 4mg /ml Dexamethasone, Manual therapy, and Re-evaluation.  PLAN FOR NEXT SESSION: Review HEP and progress PRN, manual/TPDN for lumbar region, progress lumbar mobility and LE stretching, progress core/hip strengthening   Rosana Hoes, PT, DPT, LAT, ATC 04/25/23  4:21 PM Phone: [phone] Fax: [phone]

## 2023-04-25 NOTE — Patient Instructions (Signed)
Access Code: [email or web address] URL: [email or web address] Date: 04/25/2023 Prepared by: Rosana Hoes  Exercises - Supine Lower Trunk Rotation  - 1 x daily - 10 reps - 5 seconds hold - Hooklying Single Knee to Chest Stretch  - 1 x daily - 3 reps - 20 seconds hold - Supine Piriformis Stretch with Foot on Ground  - 1 x daily - 3 reps - 20 seconds hold - Straight Leg Raise  - 1 x daily - 3 sets - 5 reps - Bridge  - 1 x daily - 3 sets - 5 reps - Seated Hamstring Stretch  - 1 x daily - 3 reps - 20 seconds hold

## 2023-04-26 DIAGNOSIS — G8929 Other chronic pain: Secondary | ICD-10-CM | POA: Diagnosis not present

## 2023-04-27 DIAGNOSIS — G8929 Other chronic pain: Secondary | ICD-10-CM | POA: Diagnosis not present

## 2023-04-28 ENCOUNTER — Ambulatory Visit (INDEPENDENT_AMBULATORY_CARE_PROVIDER_SITE_OTHER): Payer: Medicaid Other | Admitting: *Deleted

## 2023-04-28 DIAGNOSIS — J309 Allergic rhinitis, unspecified: Secondary | ICD-10-CM

## 2023-04-28 DIAGNOSIS — G8929 Other chronic pain: Secondary | ICD-10-CM | POA: Diagnosis not present

## 2023-04-29 DIAGNOSIS — G8929 Other chronic pain: Secondary | ICD-10-CM | POA: Diagnosis not present

## 2023-04-30 DIAGNOSIS — G8929 Other chronic pain: Secondary | ICD-10-CM | POA: Diagnosis not present

## 2023-05-02 DIAGNOSIS — G8929 Other chronic pain: Secondary | ICD-10-CM | POA: Diagnosis not present

## 2023-05-03 ENCOUNTER — Ambulatory Visit (INDEPENDENT_AMBULATORY_CARE_PROVIDER_SITE_OTHER): Payer: Medicaid Other | Admitting: *Deleted

## 2023-05-03 DIAGNOSIS — J309 Allergic rhinitis, unspecified: Secondary | ICD-10-CM

## 2023-05-08 NOTE — Therapy (Signed)
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)    a.) s/p embolization 08/19/2020: 29 mm RIGHT internal iliac artery aneurysm   Anxiety    Aortic atherosclerosis (HCC)    Bilateral carpal tunnel syndrome 01/10/2018   Bipolar disorder (HCC)    Chorioretinal inflammation of both eyes    a.) on azothioprine   Chronic lower back pain    Coronary artery calcification seen on CT scan    a.) cCTA 04/21/2022: Ca score 20.6 (61st percentile for age/sex match control)   DDD (degenerative disc disease), cervical    Depression    Diastolic dysfunction    a.) TTE 01/13/2021: EF 60-65%, mod LVH, triv MR, G1DD   GERD (gastroesophageal reflux disease)    Hepatic steatosis    History of alcohol abuse    History of nuclear stress test    Myoview 10/16: EF 50%, diaphragmatic attenuation, no ischemia, low risk   Hypertension    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   a.) CT head 12/25/2012 --> RIGHT basal ganglia hypoattenuation related to remote lacunar infarct   Lipoma    Long term (current) use of anticoagulants    a.) apixaban   Long-term current use of immunomodulator    a.) on azothioprine for peripheral focal chorioretinal inflammation (both eyes)   Marijuana use    Migraine    Moderate persistent asthma with acute exacerbation 05/02/2018   PAF (paroxysmal atrial fibrillation) (HCC) 03/27/2015   a.) CHA2DS2VASc = 5  (HTN, CVA x2, vascular disease history, T2DM);  b.) s/p ablation 10/02/2015; c.) s/p ablation 12/10/2015; d.) s/p DCCV (200 J x 1) 12/11/2015; e.) s/p ablation 04/28/2022; f.) rate/rhythm maintained on oral diltiazem + carvedilol; chronically anticoagulated with apixaban   Pilonidal cyst    Schizophrenia (HCC)    Sciatica neuralgia    T2DM (type 2 diabetes mellitus) (HCC)    Thoracic aortic ectasia (HCC) 01/13/2021   a.) TTE 01/13/2021: Ao root 38 mm, asc Ao 39 mm   Past Surgical History:  Procedure Laterality Date   ATRIAL FIBRILLATION ABLATION  09/22/2015   ATRIAL FIBRILLATION ABLATION N/A 04/28/2022   Procedure: ATRIAL FIBRILLATION ABLATION;  Surgeon: Regan Lemming, MD;  Location: [facility];  Service: Cardiovascular;  Laterality: N/A;   CATARACT EXTRACTION Bilateral    CYST EXCISION  1996-97   surgery back of head    ELECTROPHYSIOLOGIC STUDY N/A 09/22/2015   Procedure: Atrial Fibrillation Ablation;  Surgeon: Will Jorja Loa, MD;  Location: [facility];  Service: Cardiovascular;  Laterality: N/A;   ELECTROPHYSIOLOGIC STUDY N/A 12/10/2015   Procedure: Atrial Fibrillation Ablation;  Surgeon: Will Jorja Loa, MD;  Location: [facility];  Service: Cardiovascular;  Laterality: N/A;   ELECTROPHYSIOLOGIC STUDY N/A 12/11/2015   Procedure:  Cardioversion;  Surgeon: Will Jorja Loa, MD;  Location: [facility];  Service: Cardiovascular;  Laterality: N/A;   EMBOLIZATION (CATH LAB) Right 08/19/2020   Procedure: EMBOLIZATION;  Surgeon: Leonie Douglas, MD;  Location: [facility];  Service: Cardiovascular;  Laterality: Right;  hypogastric   EXCISION MASS HEAD N/A 01/06/2017   Procedure: EXCISION MASS FOREHEAD;  Surgeon: Glenna Fellows, MD;  Location: [facility];  Service: Plastics;  Laterality: N/A;   EXCISION MASS UPPER EXTREMETIES Right 08/08/2022   Procedure: EXCISION MASS RIGHT FOREARM;  Surgeon: Betha Loa, MD;  Location:  [facility];  Service: Orthopedics;  Laterality: Right;  45 MIN   GANGLION CYST EXCISION Left    INTERCOSTAL NERVE BLOCK  07/19/2003   KNEE ARTHROSCOPY Right 07/18/2014   MASS EXCISION N/A 01/25/2021   Procedure: EXCISION SUBCUTANEOUS VS SUBFASCIAL MASS TORSO 3CM;  Surgeon: Glenna Fellows, MD;  Location: [facility];  Service: Plastics;  Laterality: N/A;   PILONIDAL CYST EXCISION N/A 09/13/2022   Procedure: CYST EXCISION PILONIDAL EXTENSIVE;  Surgeon: Henrene Dodge, MD;  Location: [facility];  Service: General;  Laterality: N/A;   Patient Active Problem List   Diagnosis Date Noted   Complete tear of left rotator cuff 10/28/2022   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   Pilonidal cyst 09/13/2022   Aortic atherosclerosis (HCC) 06/07/2022   Hypercoagulable state due to paroxysmal atrial fibrillation (HCC) 03/23/2021   Type 2 diabetes mellitus with hyperglycemia, without long-term current use of insulin (HCC) 02/10/2021   Neck pain on left side 03/06/2019   Musculoskeletal chest pain 07/24/2018   Asthma, mild intermittent 05/02/2018   Allergic rhinitis caused by mold 05/02/2018   Tobacco use 05/02/2018   Bilateral carpal tunnel syndrome 01/10/2018   Stroke (HCC)    Schizophrenia (HCC)    Migraine    History of nuclear stress test    History of alcohol abuse    GERD (gastroesophageal reflux disease)    Dysrhythmia    Chronic lower back pain    Anxiety    Arthritis of knee, right 04/12/2017   Bipolar disorder (HCC) 04/03/2017   Lipoma of forehead 09/22/2016   Trigger ring finger of right hand 06/22/2016   Paroxysmal atrial fibrillation (HCC)    AF (atrial fibrillation) (HCC) 12/10/2015   Eczema 07/10/2015   History of CVA (cerebrovascular accident) 05/04/2015   Tear of medial meniscus of right knee 03/18/2015   Chronic pain of right knee 03/12/2015   Other and unspecified hyperlipidemia 11/25/2013   Nasal congestion 11/25/2013   Sinusitis,  chronic 05/09/2013   Chronic low back pain 10/12/2012   Lumbar radiculopathy 10/12/2012   Hypertension 10/12/2012   Depression 10/12/2012   Insomnia 10/12/2012    PCP: Hoy Register, MD  REFERRING PROVIDER: Drake Leach, PA-C  REFERRING DIAG: Lumbar spondylosis; Spinal stenosis of lumbar region, unspecified whether neurogenic claudication present; Lumbar radiculopathy; DDD (degenerative disc disease), lumbar  Rationale for Evaluation and Treatment: Rehabilitation  THERAPY DIAG:  Other low back pain  Muscle weakness (generalized)  Other abnormalities of gait and mobility  ONSET DATE: Chronic   SUBJECTIVE:  SUBJECTIVE STATEMENT: Low back was hurting 10/10 early this morning , but he has loosened up and it's a little better, 8/10. Pt reports he has been completing his HEP.  Eval: Patient reports lower back pain, he is wearing a back brace. He did have an injection that helped for about a week so is going to have it done again. He reports that his back is feeling stiff and the left sciatic nerve is hurting but the right side is worse. Having burning, tingling, numbness down both legs and pain in the lower back area. He states the pain stays between a 9-10/10 pain.   PERTINENT HISTORY:  See PMH above  PAIN:  Are you having pain? Yes:  NPRS scale: Eval 9-10/10  05/09/23: 8/10 Pain location: Lower back, down both legs Pain description: Burning, tingling, numbness Aggravating factors: Sitting, walking, standing, bending Relieving factors: Nothing  PRECAUTIONS: None  RED FLAGS: None   WEIGHT BEARING RESTRICTIONS: No  FALLS:  Has patient fallen in last 6 months? Yes. Number of falls 1 about 4 months ago when his knee went out  PLOF: Independent with basic ADLs  PATIENT GOALS: Pain  relief   OBJECTIVE:  Note: Objective measures were completed at Evaluation unless otherwise noted. PATIENT SURVEYS:  FOTO 59% functional status  COGNITION: Overall cognitive status: Within functional limits for tasks assessed     SENSATION: Patient reports generalized sensation changes throughout both legs  MUSCLE LENGTH: Significant hamstring flexibility deficits bilaterally  POSTURE:  Rounded shoulder and forward head posture, he demonstrates trunk lean to right and consistently shift weight due to pain   PALPATION: Generalized tender to palpation bilateral lumbar paraspinals, gluteal region; lumbar spine CPA limited throughout  LUMBAR ROM:   AROM eval  Flexion 25%  Extension 0%  Right lateral flexion 25%  Left lateral flexion 25%  Right rotation 25%  Left rotation 25%   (Blank rows = not tested)  LOWER EXTREMITY ROM:      Hip PROM grossly WFL with report of muscular tightness  LOWER EXTREMITY MMT:    MMT Right eval Left eval  Hip flexion 4- 4-  Hip extension 2 2  Hip abduction 2 2  Hip adduction    Hip internal rotation    Hip external rotation    Knee flexion 4 4  Knee extension 4 4  Ankle dorsiflexion    Ankle plantarflexion    Ankle inversion    Ankle eversion     (Blank rows = not tested)  LUMBAR SPECIAL TESTS:  Lumbar radicular testing positive bilaterally  FUNCTIONAL TESTS:  30 seconds chair stand test: 6 reps with use of UE for support  GAIT: Assistive device utilized: Single point cane Level of assistance: Modified independence Comments: Decreased gait speed, antalgic on the right   TODAY'S TREATMENT:       [facility] Adult PT Treatment:                                                DATE: 05/09/23 Therapeutic Exercise: LTR 5 x 5 sec each SKTC stretch 2 x 20 sec each Piriformis stretch 2 x 20 sec each SLR partial range x 10 each Bridge x 10 Seated hamstring stretch 2 x 20 sec each Manual Therapy: STM to the lumbar paraspinals and  QL Skilled palpation of the lumbar paraspinals to identify TrPs Modalities: Estim  c TPDN mA 10pps, intensity as tolerated Trigger Point Dry Needling Treatment: Pre-treatment instruction: Patient instructed on dry needling rationale, procedures, and possible side effects including pain during treatment (achy,cramping feeling), bruising, drop of blood, lightheadedness, nausea, sweating. Patient Consent Given: Yes Education handout provided: Yes Muscles treated: Lumbar paraspinals Needle size and number: .30x48mm x 4 Electrical stimulation performed: Yes Parameters:  mA 10 pps, intensity to tolerance Treatment response/outcome: Twitch response elicited Post-treatment instructions: Patient instructed to expect possible mild to moderate muscle soreness later today and/or tomorrow. Patient instructed in methods to reduce muscle soreness and to continue prescribed HEP. If patient was dry needled over the lung field, patient was instructed on signs and symptoms of pneumothorax and, however unlikely, to see immediate medical attention should they occur. Patient was also educated on signs and symptoms of infection and to seek medical attention should they occur. Patient verbalized understanding of these instructions and education.   [facility] Adult PT Treatment:                                                DATE: 04/25/2023 Therapeutic Exercise: LTR 3 x 5 sec each SKTC stretch 2 x 20 sec each Piriformis stretch 2 x 20 sec each SLR partial range x 5 each Bridge x 5 Seated hamstring stretch 2 x 20 sec each  PATIENT EDUCATION:  Education details: Exam findings, POC, HEP Person educated: Patient Education method: Explanation, Demonstration, Tactile cues, Verbal cues, and Handouts Education comprehension: verbalized understanding, returned demonstration, verbal cues required, tactile cues required, and needs further education  HOME EXERCISE PROGRAM: Access Code: [email or web address]   ASSESSMENT: CLINICAL  IMPRESSION: PT was completed for lumbopelvic flexibility and strengthening f/b STM as noted above and TPDN c mA estim to the lumbar paraspinals. Pt tolerated PT today without adverse effects. Pt reported a decrease in pain at end of the session to 6/10. Will assess pt's longer term response to the TPDN c estim the next PT session.Marland Kitchen   EVAL: Patient is a 62 y.o. male who was seen today for physical therapy evaluation and treatment for chronic lower back and bilateral radicular pain. He demonstrates significant limitations in his lumbar mobility and general flexibility of the hips and LE. He exhibit gross strength deficits of the core and hip region. All impairments are impacting his functional ability.    OBJECTIVE IMPAIRMENTS: Abnormal gait, decreased activity tolerance, decreased balance, difficulty walking, decreased ROM, decreased strength, hypomobility, increased muscle spasms, impaired flexibility, impaired sensation, postural dysfunction, and pain.   ACTIVITY LIMITATIONS: carrying, lifting, bending, sitting, standing, squatting, transfers, bed mobility, dressing, and locomotion level  PARTICIPATION LIMITATIONS: meal prep, cleaning, laundry, driving, shopping, and community activity  PERSONAL FACTORS: Fitness, Past/current experiences, Time since onset of injury/illness/exacerbation, and 3+ comorbidities: see PMH above  are also affecting patient's functional outcome.   REHAB POTENTIAL: Fair due to chronicity and severity of symptoms  CLINICAL DECISION MAKING: Evolving/moderate complexity  EVALUATION COMPLEXITY: Moderate   GOALS: Goals reviewed with patient? Yes  SHORT TERM GOALS: Target date: 05/23/2023  Patient will be I with initial HEP in order to progress with therapy. Baseline: HEP provided at eval Goal status: INITIAL  2.  Patient will report lower back and leg pain </= 7/10 in order to reduce functional limitations Baseline: 9-10/10 pain Goal status: INITIAL  LONG TERM  GOALS: Target date: 06/20/2023  Patient will  be I with final HEP to maintain progress from PT. Baseline: HEP provided at eval Goal status: INITIAL  2.  Patient will report >/= 66% status on FOTO to indicate improved functional ability. Baseline: 59% functional status Goal status: INITIAL  3.  Patient will be able to walk >/= 30 minutes without requiring rest due to pain in order to improve community access  Baseline: patient only able to walk short distances due to pain  Goal status: INITIAL  4.  Patient will report </= 4/10 pain level with household activity and ambulation to reduce functional limitation   Baseline: 9-10/10 pain Goal status: INITIAL  5.  Patient will demonstrate gluteal strength >/= 4-/5 MMT in order to improve standing and walking tolerance  Baseline: 2/5 MMT  Goal status: INITIAL   PLAN: PT FREQUENCY: 1-2x/week  PT DURATION: 8 weeks  PLANNED INTERVENTIONS: Therapeutic exercises, Therapeutic activity, Neuromuscular re-education, Balance training, Gait training, Patient/Family education, Self Care, Joint mobilization, Joint manipulation, Aquatic Therapy, Dry Needling, Electrical stimulation, Spinal manipulation, Spinal mobilization, Cryotherapy, Moist heat, Taping, Traction, Ionotophoresis 4mg /ml Dexamethasone, Manual therapy, and Re-evaluation.  PLAN FOR NEXT SESSION: Review HEP and progress PRN, manual/TPDN for lumbar region, progress lumbar mobility and LE stretching, progress core/hip strengthening   Saudia Smyser MS, PT 05/09/23 11:28 AM

## 2023-05-09 ENCOUNTER — Ambulatory Visit: Payer: Medicaid Other

## 2023-05-09 DIAGNOSIS — M6281 Muscle weakness (generalized): Secondary | ICD-10-CM

## 2023-05-09 DIAGNOSIS — M5459 Other low back pain: Secondary | ICD-10-CM | POA: Diagnosis not present

## 2023-05-09 DIAGNOSIS — R2689 Other abnormalities of gait and mobility: Secondary | ICD-10-CM

## 2023-05-09 NOTE — Patient Instructions (Signed)

## 2023-05-11 ENCOUNTER — Ambulatory Visit: Payer: Medicaid Other

## 2023-05-12 ENCOUNTER — Telehealth: Payer: Self-pay

## 2023-05-12 NOTE — Telephone Encounter (Signed)
Spoke with pt re: no show appt 05/12/23. Pt reports he was out of town and forgot to cancel today's appt. Pt was reminded of his next appt and the attendance policy

## 2023-05-16 ENCOUNTER — Ambulatory Visit: Payer: Medicaid Other

## 2023-05-16 DIAGNOSIS — H4043X2 Glaucoma secondary to eye inflammation, bilateral, moderate stage: Secondary | ICD-10-CM | POA: Diagnosis not present

## 2023-05-16 DIAGNOSIS — H3581 Retinal edema: Secondary | ICD-10-CM | POA: Diagnosis not present

## 2023-05-16 DIAGNOSIS — Z79899 Other long term (current) drug therapy: Secondary | ICD-10-CM | POA: Diagnosis not present

## 2023-05-16 DIAGNOSIS — H30033 Focal chorioretinal inflammation, peripheral, bilateral: Secondary | ICD-10-CM | POA: Diagnosis not present

## 2023-05-16 DIAGNOSIS — Z961 Presence of intraocular lens: Secondary | ICD-10-CM | POA: Diagnosis not present

## 2023-05-17 ENCOUNTER — Encounter: Payer: Self-pay | Admitting: Family Medicine

## 2023-05-17 ENCOUNTER — Ambulatory Visit: Payer: Medicaid Other | Attending: Family Medicine | Admitting: Family Medicine

## 2023-05-17 VITALS — BP 147/102 | HR 88 | Wt 241.2 lb

## 2023-05-17 DIAGNOSIS — E1159 Type 2 diabetes mellitus with other circulatory complications: Secondary | ICD-10-CM | POA: Diagnosis not present

## 2023-05-17 DIAGNOSIS — E1169 Type 2 diabetes mellitus with other specified complication: Secondary | ICD-10-CM | POA: Diagnosis not present

## 2023-05-17 DIAGNOSIS — J4520 Mild intermittent asthma, uncomplicated: Secondary | ICD-10-CM | POA: Diagnosis not present

## 2023-05-17 DIAGNOSIS — R21 Rash and other nonspecific skin eruption: Secondary | ICD-10-CM | POA: Diagnosis not present

## 2023-05-17 DIAGNOSIS — M65341 Trigger finger, right ring finger: Secondary | ICD-10-CM

## 2023-05-17 DIAGNOSIS — I152 Hypertension secondary to endocrine disorders: Secondary | ICD-10-CM | POA: Diagnosis not present

## 2023-05-17 DIAGNOSIS — Z23 Encounter for immunization: Secondary | ICD-10-CM | POA: Diagnosis not present

## 2023-05-17 DIAGNOSIS — Z7984 Long term (current) use of oral hypoglycemic drugs: Secondary | ICD-10-CM | POA: Diagnosis not present

## 2023-05-17 DIAGNOSIS — I480 Paroxysmal atrial fibrillation: Secondary | ICD-10-CM

## 2023-05-17 DIAGNOSIS — E1165 Type 2 diabetes mellitus with hyperglycemia: Secondary | ICD-10-CM | POA: Diagnosis not present

## 2023-05-17 LAB — GLUCOSE, POCT (MANUAL RESULT ENTRY): POC Glucose: 139 mg/dL — AB (ref 70–99)

## 2023-05-17 LAB — POCT GLYCOSYLATED HEMOGLOBIN (HGB A1C): HbA1c, POC (controlled diabetic range): 6.5 % (ref 0.0–7.0)

## 2023-05-17 MED ORDER — VALSARTAN 160 MG PO TABS: 160.0000 mg | ORAL_TABLET | Freq: Every day | ORAL | 1 refills | Status: DC

## 2023-05-17 MED ORDER — VALSARTAN 80 MG PO TABS: 80.0000 mg | ORAL_TABLET | Freq: Every day | ORAL | 1 refills | Status: DC

## 2023-05-17 MED ORDER — ATORVASTATIN CALCIUM 40 MG PO TABS: 40.0000 mg | ORAL_TABLET | Freq: Every day | ORAL | 1 refills | Status: DC

## 2023-05-17 MED ORDER — METHOCARBAMOL 500 MG PO TABS: 500.0000 mg | ORAL_TABLET | Freq: Three times a day (TID) | ORAL | 3 refills | Status: DC | PRN

## 2023-05-17 NOTE — Progress Notes (Signed)
Subjective:  Patient ID: Jason Stokes, male    DOB: Oct 20, 1960  Age: 62 y.o. MRN: [id number]  CC: Medical Management of Chronic Issues (Patient states that one of his medication keeps his leg swelling )   HPI Jason Stokes is a 62 y.o. year old male with a history of  hypertension, type 2 diabetes mellitus (diet-controlled A1c 6.5), bipolar disorder, paroxysmal A. 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  He presents with leg swelling. He believes the swelling is a side effect of one of his medications, but he is unable to recall which one. The swelling subsides when he stops taking the medication and resumes when he starts again.  The patient also reports high blood pressure, which was confirmed during a recent eye procedure.  Endorses adherence with Carvedilol and diltiazem. The patient's diabetes is currently managed without medication. He reports having stopped taking metformin and has seen an improvement in his A1c levels, which are now at 6.5. He attributes this improvement to changes in his diet.  The patient also discusses his glaucoma treatment. He recently learned he has glaucoma and is now on eye drops to manage the condition.  He also reports having had a recent eye procedure, which left him in pain and temporarily impaired his vision.  The patient also mentions a rash on his face and body, which he would like to have evaluated by a dermatologist.  Referral has been placed early on in the month.  He also reports having a finger that did not straighten out completely after surgery for trigger finger and is considering seeking a second opinion if he is not satisfied after his upcoming follow-up  appointment.       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)    a.) s/p embolization 08/19/2020: 29 mm RIGHT internal iliac artery aneurysm   Anxiety    Aortic atherosclerosis (HCC)    Bilateral carpal tunnel syndrome 01/10/2018   Bipolar disorder (HCC)    Chorioretinal inflammation of both eyes    a.) on azothioprine   Chronic lower back pain    Coronary artery calcification seen on CT scan    a.) cCTA 04/21/2022: Ca score 20.6 (61st percentile for age/sex match control)   DDD (degenerative disc disease), cervical    Depression    Diastolic dysfunction    a.) TTE 01/13/2021: EF 60-65%, mod LVH, triv MR, G1DD   GERD (gastroesophageal reflux disease)    Hepatic steatosis    History of alcohol abuse    History of nuclear stress test    Myoview 10/16: EF 50%, diaphragmatic attenuation, no ischemia, low risk   Hypertension    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   a.) CT head 12/25/2012 --> RIGHT basal ganglia hypoattenuation related to remote lacunar infarct   Lipoma    Long term (current) use of anticoagulants    a.) apixaban   Long-term current use of immunomodulator    a.) on azothioprine for peripheral focal chorioretinal inflammation (both eyes)   Marijuana use    Migraine    Moderate persistent asthma with acute exacerbation 05/02/2018   PAF (paroxysmal atrial fibrillation) (HCC) 03/27/2015   a.) CHA2DS2VASc =  5 (HTN, CVA x2, vascular disease history, T2DM);  b.) s/p ablation 10/02/2015; c.) s/p ablation 12/10/2015; d.) s/p DCCV (200 J x 1) 12/11/2015; e.) s/p ablation 04/28/2022; f.) rate/rhythm maintained on oral diltiazem + carvedilol; chronically anticoagulated with apixaban   Pilonidal cyst    Schizophrenia (HCC)    Sciatica neuralgia    T2DM (type 2 diabetes mellitus) (HCC)    Thoracic aortic ectasia (HCC) 01/13/2021   a.) TTE 01/13/2021: Ao root 38 mm, asc Ao 39 mm    Past Surgical History:  Procedure  Laterality Date   ATRIAL FIBRILLATION ABLATION  09/22/2015   ATRIAL FIBRILLATION ABLATION N/A 04/28/2022   Procedure: ATRIAL FIBRILLATION ABLATION;  Surgeon: Regan Lemming, MD;  Location: [facility];  Service: Cardiovascular;  Laterality: N/A;   CATARACT EXTRACTION Bilateral    CYST EXCISION  1996-97   surgery back of head    ELECTROPHYSIOLOGIC STUDY N/A 09/22/2015   Procedure: Atrial Fibrillation Ablation;  Surgeon: Will Jorja Loa, MD;  Location: [facility];  Service: Cardiovascular;  Laterality: N/A;   ELECTROPHYSIOLOGIC STUDY N/A 12/10/2015   Procedure: Atrial Fibrillation Ablation;  Surgeon: Will Jorja Loa, MD;  Location: [facility];  Service: Cardiovascular;  Laterality: N/A;   ELECTROPHYSIOLOGIC STUDY N/A 12/11/2015   Procedure: Cardioversion;  Surgeon: Will Jorja Loa, MD;  Location: [facility];  Service: Cardiovascular;  Laterality: N/A;   EMBOLIZATION (CATH LAB) Right 08/19/2020   Procedure: EMBOLIZATION;  Surgeon: Leonie Douglas, MD;  Location: [facility];  Service: Cardiovascular;  Laterality: Right;  hypogastric   EXCISION MASS HEAD N/A 01/06/2017   Procedure: EXCISION MASS FOREHEAD;  Surgeon: Glenna Fellows, MD;  Location: [facility];  Service: Plastics;  Laterality: N/A;   EXCISION MASS UPPER EXTREMETIES Right 08/08/2022   Procedure: EXCISION MASS RIGHT FOREARM;  Surgeon: Betha Loa, MD;  Location: [facility];  Service: Orthopedics;  Laterality: Right;  45 MIN   GANGLION CYST EXCISION Left    INTERCOSTAL NERVE BLOCK  07/19/2003   KNEE ARTHROSCOPY Right 07/18/2014   MASS EXCISION N/A 01/25/2021   Procedure: EXCISION SUBCUTANEOUS VS SUBFASCIAL MASS TORSO 3CM;  Surgeon: Glenna Fellows, MD;  Location: [facility];  Service: Plastics;  Laterality: N/A;   PILONIDAL CYST EXCISION N/A 09/13/2022   Procedure: CYST EXCISION PILONIDAL EXTENSIVE;  Surgeon: Henrene Dodge, MD;   Location: [facility];  Service: General;  Laterality: N/A;    Family History  Problem Relation Age of Onset   Heart disease Father    Schizophrenia Sister     Social History   Socioeconomic History   Marital status: Single    Spouse name: [name]   Number of children: 3   Years of education: HS   Highest education level: 11th grade  Occupational History    Comment: disabled  Tobacco Use   Smoking status: Some Days    Current packs/day: 0.00    Types: Cigarettes    Start date: 09/10/1993    Last attempt to quit: 09/10/2022    Years since quitting: 0.6    Passive exposure: Never   Smokeless tobacco: Never   Tobacco comments:    1 cigarettes every day  06-02-2021  Vaping Use   Vaping status: Never Used  Substance and Sexual Activity   Alcohol use: Yes    Alcohol/week: 6.0 standard drinks of alcohol    Types: 6 Cans of beer per week    Comment: 6 pack weekly 05/26/22  Drug use: Yes    Types: Marijuana    Comment: sunday was last time   Sexual activity: Not Currently  Other Topics Concern   Not on file  Social History Narrative   Patient lives at home with [place].    Patient has 3 children 2 step.    Patient has 13 years of schooling.    Patient is right handed.    Social Determinants of Health   Financial Resource Strain: Low Risk  (11/15/2022)   Overall Financial Resource Strain (CARDIA)    Difficulty of Paying Living Expenses: Not hard at all  Food Insecurity: Food Insecurity Present (11/15/2022)   Hunger Vital Sign    Worried About Running Out of Food in the Last Year: Never true    Ran Out of Food in the Last Year: Sometimes true  Transportation Needs: No Transportation Needs (11/15/2022)   PRAPARE - Administrator, Civil Service (Medical): No    Lack of Transportation (Non-Medical): No  Physical Activity: Unknown (11/15/2022)   Exercise Vital Sign    Days of Exercise per Week: 0 days    Minutes of Exercise per Session: Not on file  Stress: No  Stress Concern Present (11/15/2022)   [employer] of Occupational Health - Occupational Stress Questionnaire    Feeling of Stress : Not at all  Social Connections: Socially Isolated (11/15/2022)   Social Connection and Isolation Panel [NHANES]    Frequency of Communication with Friends and Family: Twice a week    Frequency of Social Gatherings with Friends and Family: Once a week    Attends Religious Services: Never    Database administrator or Organizations: No    Attends Engineer, structural: Not on file    Marital Status: Never married    Allergies  Allergen Reactions   Lisinopril Anaphylaxis    Swelling of lips and tongue.   Tylenol [Acetaminophen] Other (See Comments)    Acid reflux    Lyrica [Pregabalin] Palpitations    Outpatient Medications Prior to Visit  Medication Sig Dispense Refill   albuterol (VENTOLIN HFA) 108 (90 Base) MCG/ACT inhaler Inhale 2 puffs into the lungs every 4 (four) hours as needed for wheezing or shortness of breath. 54 g 0   apixaban (ELIQUIS) 5 MG TABS tablet Take 1 tablet (5 mg total) by mouth 2 (two) times daily. 60 tablet 5   azaTHIOprine (IMURAN) 50 MG tablet Take 100 mg by mouth every morning.     azelastine (ASTELIN) 0.1 % nasal spray Place 1 spray into both nostrils 2 (two) times daily. 1 spray each nostril twice a day 90 mL 1   brimonidine (ALPHAGAN) 0.2 % ophthalmic solution Place 1 drop into both eyes every 8 (eight) hours.     carvedilol (COREG) 25 MG tablet Take 1 tablet (25 mg total) by mouth 2 (two) times daily. 180 tablet 2   cetirizine (ZYRTEC) 10 MG tablet Take 1 tablet (10 mg total) by mouth daily as needed for allergies (Can take an extra dose during flare ups.). 180 tablet 1   cromolyn (OPTICROM) 4 % ophthalmic solution Place 1 drop into both eyes 4 (four) times daily as needed. 10 mL 5   Difluprednate 0.05 % EMUL Place 1 drop into both eyes 2 (two) times daily.     diltiazem (CARDIZEM CD) 180 MG 24 hr capsule TAKE 1  CAPSULE BY MOUTH EVERY DAY (Patient taking differently: 180 mg every morning. TAKE 1 CAPSULE BY MOUTH EVERY  DAY) 90 capsule 2   dorzolamide-timolol (COSOPT) 22.3-6.8 MG/ML ophthalmic solution Place 1 drop into both eyes 2 (two) times daily.     DULoxetine (CYMBALTA) 60 MG capsule Take 1 capsule (60 mg total) by mouth daily. For chronic pain and neuropathy 90 capsule 1   EPINEPHrine 0.3 mg/0.3 mL IJ SOAJ injection Inject 0.3 mg into the muscle as needed for anaphylaxis. 0.3 mL 1   fluticasone (FLONASE) 50 MCG/ACT nasal spray 1 spray each nostril twice a day (Patient taking differently: Place 1 spray into both nostrils as needed. 1 spray each nostril twice a day) 16 g 5   fluticasone furoate-vilanterol (BREO ELLIPTA) 200-25 MCG/ACT AEPB Inhale 1 puff into the lungs daily. 180 each 1   folic acid (FOLVITE) 1 MG tablet Take 1 mg by mouth daily.     furosemide (LASIX) 20 MG tablet Take 1 tablet (20 mg total) by mouth daily. 30 tablet 3   GARLIC PO Take 1 tablet by mouth daily.     glucose blood (ACCU-CHEK GUIDE) test strip Use to check blood sugar once daily. E11.69 100 each 6   ipratropium (ATROVENT) 0.03 % nasal spray Place 2 sprays into both nostrils 4 (four) times daily as needed for rhinitis. 30 mL 5   latanoprost (XALATAN) 0.005 % ophthalmic solution Place 1 drop into both eyes daily.     Misc. Devices MISC Back brace. Diagnosis chronic back pain 1 each 0   Misc. Devices MISC Left knee brace. Diagnosis L knee pain 1 each 0   montelukast (SINGULAIR) 10 MG tablet Take 1 tablet (10 mg total) by mouth at bedtime. 90 tablet 1   Olopatadine HCl (PATADAY) 0.2 % SOLN Place 1 drop into both eyes daily as needed. 7.5 mL 1   omeprazole (PRILOSEC) 20 MG capsule TAKE 1 CAPSULE BY MOUTH EVERY DAY (Patient taking differently: 20 mg every morning. TAKE 1 CAPSULE BY MOUTH EVERY DAY) 90 capsule 0   oxyCODONE 10 MG TABS Take 1 tablet (10 mg total) by mouth every 4 (four) hours as needed for severe pain. 15 tablet 0    Vitamin D3 (VITAMIN D) 25 MCG tablet Take 1,000 Units by mouth daily.     atorvastatin (LIPITOR) 40 MG tablet TAKE 1 TABLET BY MOUTH EVERY DAY 90 tablet 1   metFORMIN (GLUCOPHAGE) 500 MG tablet Take 1 tablet (500 mg total) by mouth 2 (two) times daily with a meal. For 1 week then increase to 2 tablets twice a day 360 tablet 1   methocarbamol (ROBAXIN) 500 MG tablet TAKE 1 TABLET BY MOUTH EVERY 8 HOURS AS NEEDED FOR MUSCLE SPASM 30 tablet 0   No facility-administered medications prior to visit.     ROS Review of Systems  Constitutional:  Negative for activity change and appetite change.  HENT:  Negative for sinus pressure and sore throat.   Respiratory:  Negative for chest tightness, shortness of breath and wheezing.   Cardiovascular:  Negative for chest pain and palpitations.  Gastrointestinal:  Negative for abdominal distention, abdominal pain and constipation.  Genitourinary: Negative.   Musculoskeletal:        See HPI  Skin:  Positive for rash.  Psychiatric/Behavioral:  Negative for behavioral problems and dysphoric mood.     Objective:  BP (!) 147/102   Pulse 88   Wt 241 lb 3.2 oz (109.4 kg)   SpO2 99%   BMI 31.82 kg/m      05/17/2023   11:40 AM 05/17/2023   11:07 AM  03/28/2023   10:36 AM  BP/Weight  Systolic BP 147 164 160  Diastolic BP 102 110 106  Wt. (Lbs)  241.2   BMI  31.82 kg/m2       Physical Exam Constitutional:      Appearance: He is well-developed.  Cardiovascular:     Rate and Rhythm: Normal rate.     Heart sounds: Normal heart sounds. No murmur heard. Pulmonary:     Effort: Pulmonary effort is normal.     Breath sounds: Normal breath sounds. No wheezing or rales.  Chest:     Chest wall: No tenderness.  Abdominal:     General: Bowel sounds are normal. There is no distension.     Palpations: Abdomen is soft. There is no mass.     Tenderness: There is no abdominal tenderness.  Musculoskeletal:        General: Normal range of motion.      Right lower leg: No edema.     Left lower leg: No edema.     Comments: Hypertrophic scar on palm of right hand.  Inability to completely straighten right ring finger.  No tenderness  Neurological:     Mental Status: He is alert and oriented to person, place, and time.  Psychiatric:        Mood and Affect: Mood normal.        Latest Ref Rng & Units 06/23/2022   10:57 AM 05/26/2022    2:53 PM 04/15/2022   12:26 PM  CMP  Glucose 70 - 99 mg/dL 161  096  045   BUN 8 - 23 mg/dL 5  8  14    Creatinine 0.61 - 1.24 mg/dL 4.09  8.11  9.14   Sodium 135 - 145 mmol/L 140  134  141   Potassium 3.5 - 5.1 mmol/L 3.5  3.8  3.9   Chloride 98 - 111 mmol/L 110  100  103   CO2 22 - 32 mmol/L 22  22  24    Calcium 8.9 - 10.3 mg/dL 8.9  9.2  9.6   Total Protein 6.5 - 8.1 g/dL 6.4  6.9    Total Bilirubin 0.3 - 1.2 mg/dL 0.4  1.0    Alkaline Phos 38 - 126 U/L 110  466    AST 15 - 41 U/L 16  52    ALT 0 - 44 U/L 13  69      Lipid Panel     Component Value Date/Time   CHOL 156 08/22/2022 1110   TRIG 149 08/22/2022 1110   HDL 29 (L) 08/22/2022 1110   CHOLHDL 5.2 (H) 05/07/2018 1004   CHOLHDL 5.4 10/17/2013 1042   VLDL 36 10/17/2013 1042   LDLCALC 100 (H) 08/22/2022 1110    CBC    Component Value Date/Time   WBC 5.0 09/06/2022 1243   RBC 5.22 09/06/2022 1243   HGB 12.0 (L) 09/06/2022 1243   HGB 12.8 (L) 04/15/2022 1226   HCT 39.1 09/06/2022 1243   HCT 40.6 04/15/2022 1226   PLT 155 09/06/2022 1243   PLT 168 04/15/2022 1226   MCV 74.9 (L) 09/06/2022 1243   MCV 86 04/15/2022 1226   MCH 23.0 (L) 09/06/2022 1243   MCHC 30.7 09/06/2022 1243   RDW 15.3 09/06/2022 1243   RDW 16.4 (H) 04/15/2022 1226   LYMPHSABS 1.6 08/08/2019 1430   LYMPHSABS 2.0 09/13/2016 0832   MONOABS 0.7 08/08/2019 1430   EOSABS 0.2 08/08/2019 1430   EOSABS 0.1 09/13/2016 7829  BASOSABS 0.0 08/08/2019 1430   BASOSABS 0.0 09/13/2016 8657    Lab Results  Component Value Date   HGBA1C 6.5 05/17/2023    Assessment  & Plan:      Hypertension Uncontrolled despite Carvedilol, Diltiazem.  Discussed the need for additional antihypertensive medication. -Start Valsartan 80mg  tablet daily. -Check potassium levels in 1 week due to potential side effect of hyperkalemia. -Counseled on blood pressure goal of less than 130/80, low-sodium, DASH diet, medication compliance, 150 minutes of moderate intensity exercise per week. Discussed medication compliance, adverse effects.   Lower extremity edema Patient reports leg swelling not visible on exam, potentially related to an unidentified medication.  -Encouraged patient to identify the medication causing the swelling and report back. -He does have Lasix which I had prescribed previously for him.  Type II diabetes A1c 6.5, improved from 8.1. Patient reports not taking Metformin and has made dietary changes. -Remove Metformin from medication list. -Continue with diet control -Monitor A1c closely due to discontinuation of Metformin.  Glaucoma Patient reports new diagnosis and treatment with eye drops. -Request notes from ophthalmologist for further information and eye exam report  Rash Patient reports widespread bumps, particularly on face and neck. -Referral to dermatology already in place.  Atrial fibrillation No current symptoms reported. In sinus rhythm  Patient is on Eliquis. -Continue current management.  Right hand trigger finger -Status post release surgery with complications -He will keep his appointment with orthopedic and if he is unsatisfied he will reach out to me for referral for second opinion.  General Health Maintenance -Administer flu shot today.          Meds ordered this encounter  Medications   DISCONTD: valsartan (DIOVAN) 160 MG tablet    Sig: Take 1 tablet (160 mg total) by mouth daily.    Dispense:  90 tablet    Refill:  1   atorvastatin (LIPITOR) 40 MG tablet    Sig: Take 1 tablet (40 mg total) by mouth daily.     Dispense:  90 tablet    Refill:  1   valsartan (DIOVAN) 80 MG tablet    Sig: Take 1 tablet (80 mg total) by mouth daily.    Dispense:  90 tablet    Refill:  1    Discontinue 160 mg   methocarbamol (ROBAXIN) 500 MG tablet    Sig: Take 1 tablet (500 mg total) by mouth every 8 (eight) hours as needed for muscle spasms.    Dispense:  90 tablet    Refill:  3    Follow-up: Return in about 1 month (around 06/17/2023) for Blood Pressure follow-up with PCP.       Hoy Register, MD, FAAFP. [facility] [address] [facility] [place], [place] [phone]   05/17/2023, 11:45 AM

## 2023-05-17 NOTE — Patient Instructions (Signed)
VISIT SUMMARY:  During today's visit, we discussed your concerns about leg swelling, high blood pressure, diabetes management, glaucoma treatment, and a rash on your face and body. We also reviewed your general health maintenance needs.  YOUR PLAN:  -HYPERTENSION: Hypertension means high blood pressure. Your current medication, Carvedilol, is not effectively controlling your blood pressure. We are starting you on Valsartan, one tablet daily, and will check your potassium levels in one week to monitor for any side effects.  -LOWER EXTREMITY EDEMA: Lower extremity edema means swelling in your legs. You believe this is related to one of your medications. Please try to identify which medication is causing the swelling and let us know.  -DIABETES: Diabetes is a condition where your blood sugar levels are too high. Your A1c has improved to 6.5 from 8.1, and you have stopped taking Metformin. We will remove Metformin from your medication list and closely monitor your A1c levels.  -GLAUCOMA: Glaucoma is an eye condition that can lead to vision loss. You are currently using eye drops for treatment. We will request notes from your ophthalmologist to get more information about your condition.  -DERMATOLOGIC CONCERNS: You have reported a rash on your face and body. We have already referred you to a dermatologist for further evaluation.  -ATRIAL FIBRILLATION: Atrial fibrillation is an irregular heartbeat. You are currently taking Eliquis and have no symptoms. We will continue with your current management.  -GENERAL HEALTH MAINTENANCE: We administered your flu shot today and will check your blood pressure again due to the elevated readings.  INSTRUCTIONS:  Please follow up in one week to check your potassium levels after starting Valsartan. Additionally, try to identify the medication causing your leg swelling and report back to [place]. Continue monitoring your A1c levels and keep your appointment with the  dermatologist for your rash.

## 2023-05-18 ENCOUNTER — Other Ambulatory Visit: Payer: Self-pay

## 2023-05-18 ENCOUNTER — Ambulatory Visit: Payer: Medicaid Other | Admitting: Physical Therapy

## 2023-05-18 ENCOUNTER — Telehealth: Payer: Self-pay

## 2023-05-18 ENCOUNTER — Ambulatory Visit: Payer: Medicaid Other | Admitting: Orthopaedic Surgery

## 2023-05-18 ENCOUNTER — Encounter: Payer: Self-pay | Admitting: Physical Therapy

## 2023-05-18 DIAGNOSIS — G8929 Other chronic pain: Secondary | ICD-10-CM

## 2023-05-18 DIAGNOSIS — M25561 Pain in right knee: Secondary | ICD-10-CM | POA: Diagnosis not present

## 2023-05-18 DIAGNOSIS — M5459 Other low back pain: Secondary | ICD-10-CM

## 2023-05-18 DIAGNOSIS — R2689 Other abnormalities of gait and mobility: Secondary | ICD-10-CM

## 2023-05-18 DIAGNOSIS — M6281 Muscle weakness (generalized): Secondary | ICD-10-CM

## 2023-05-18 DIAGNOSIS — M1711 Unilateral primary osteoarthritis, right knee: Secondary | ICD-10-CM

## 2023-05-18 MED ORDER — LIDOCAINE HCL 1 % IJ SOLN
3.0000 mL | INTRAMUSCULAR | Status: AC | PRN
  Administered 2023-05-18: 3 mL

## 2023-05-18 MED ORDER — METHYLPREDNISOLONE ACETATE 40 MG/ML IJ SUSP
40.0000 mg | INTRAMUSCULAR | Status: AC | PRN
  Administered 2023-05-18: 40 mg via INTRA_ARTICULAR

## 2023-05-18 NOTE — Telephone Encounter (Signed)
Right knee gel 

## 2023-05-18 NOTE — Therapy (Signed)
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)    a.) s/p embolization 08/19/2020: 29 mm RIGHT internal iliac artery aneurysm   Anxiety    Aortic atherosclerosis (HCC)    Bilateral carpal tunnel syndrome 01/10/2018   Bipolar disorder (HCC)    Chorioretinal inflammation of both eyes    a.) on azothioprine   Chronic lower back pain    Coronary artery calcification seen on CT scan    a.) cCTA 04/21/2022: Ca score 20.6 (61st percentile for age/sex match control)   DDD (degenerative disc disease), cervical    Depression    Diastolic dysfunction    a.) TTE 01/13/2021: EF 60-65%, mod LVH, triv MR, G1DD   GERD (gastroesophageal reflux disease)    Hepatic steatosis    History of alcohol abuse    History of nuclear stress test    Myoview 10/16: EF 50%, diaphragmatic attenuation, no ischemia, low risk   Hypertension    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   a.) CT head 12/25/2012 --> RIGHT basal ganglia hypoattenuation related to remote lacunar infarct   Lipoma    Long term (current) use of anticoagulants    a.) apixaban   Long-term current use of immunomodulator    a.) on azothioprine for peripheral focal chorioretinal inflammation (both eyes)   Marijuana use    Migraine    Moderate persistent asthma with acute exacerbation 05/02/2018   PAF (paroxysmal atrial fibrillation) (HCC) 03/27/2015   a.) CHA2DS2VASc = 5  (HTN, CVA x2, vascular disease history, T2DM);  b.) s/p ablation 10/02/2015; c.) s/p ablation 12/10/2015; d.) s/p DCCV (200 J x 1) 12/11/2015; e.) s/p ablation 04/28/2022; f.) rate/rhythm maintained on oral diltiazem + carvedilol; chronically anticoagulated with apixaban   Pilonidal cyst    Schizophrenia (HCC)    Sciatica neuralgia    T2DM (type 2 diabetes mellitus) (HCC)    Thoracic aortic ectasia (HCC) 01/13/2021   a.) TTE 01/13/2021: Ao root 38 mm, asc Ao 39 mm   Past Surgical History:  Procedure Laterality Date   ATRIAL FIBRILLATION ABLATION  09/22/2015   ATRIAL FIBRILLATION ABLATION N/A 04/28/2022   Procedure: ATRIAL FIBRILLATION ABLATION;  Surgeon: Regan Lemming, MD;  Location: [facility];  Service: Cardiovascular;  Laterality: N/A;   CATARACT EXTRACTION Bilateral    CYST EXCISION  1996-97   surgery back of head    ELECTROPHYSIOLOGIC STUDY N/A 09/22/2015   Procedure: Atrial Fibrillation Ablation;  Surgeon: Will Jorja Loa, MD;  Location: [facility];  Service: Cardiovascular;  Laterality: N/A;   ELECTROPHYSIOLOGIC STUDY N/A 12/10/2015   Procedure: Atrial Fibrillation Ablation;  Surgeon: Will Jorja Loa, MD;  Location: [facility];  Service: Cardiovascular;  Laterality: N/A;   ELECTROPHYSIOLOGIC STUDY N/A 12/11/2015  Procedure: Cardioversion;  Surgeon: Will Jorja Loa, MD;  Location: [facility];  Service: Cardiovascular;  Laterality: N/A;   EMBOLIZATION (CATH LAB) Right 08/19/2020   Procedure: EMBOLIZATION;  Surgeon: Leonie Douglas, MD;  Location: [facility];  Service: Cardiovascular;  Laterality: Right;  hypogastric   EXCISION MASS HEAD N/A 01/06/2017   Procedure: EXCISION MASS FOREHEAD;  Surgeon: Glenna Fellows, MD;  Location: [facility];  Service: Plastics;  Laterality: N/A;   EXCISION MASS UPPER EXTREMETIES Right 08/08/2022   Procedure: EXCISION MASS RIGHT FOREARM;  Surgeon: Betha Loa, MD;  Location:  [facility];  Service: Orthopedics;  Laterality: Right;  45 MIN   GANGLION CYST EXCISION Left    INTERCOSTAL NERVE BLOCK  07/19/2003   KNEE ARTHROSCOPY Right 07/18/2014   MASS EXCISION N/A 01/25/2021   Procedure: EXCISION SUBCUTANEOUS VS SUBFASCIAL MASS TORSO 3CM;  Surgeon: Glenna Fellows, MD;  Location: [facility];  Service: Plastics;  Laterality: N/A;   PILONIDAL CYST EXCISION N/A 09/13/2022   Procedure: CYST EXCISION PILONIDAL EXTENSIVE;  Surgeon: Henrene Dodge, MD;  Location: [facility];  Service: General;  Laterality: N/A;   Patient Active Problem List   Diagnosis Date Noted   Complete tear of left rotator cuff 10/28/2022   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   Pilonidal cyst 09/13/2022   Aortic atherosclerosis (HCC) 06/07/2022   Hypercoagulable state due to paroxysmal atrial fibrillation (HCC) 03/23/2021   Type 2 diabetes mellitus with hyperglycemia, without long-term current use of insulin (HCC) 02/10/2021   Neck pain on left side 03/06/2019   Musculoskeletal chest pain 07/24/2018   Asthma, mild intermittent 05/02/2018   Allergic rhinitis caused by mold 05/02/2018   Tobacco use 05/02/2018   Bilateral carpal tunnel syndrome 01/10/2018   Stroke (HCC)    Schizophrenia (HCC)    Migraine    History of nuclear stress test    History of alcohol abuse    GERD (gastroesophageal reflux disease)    Dysrhythmia    Chronic lower back pain    Anxiety    Arthritis of knee, right 04/12/2017   Bipolar disorder (HCC) 04/03/2017   Lipoma of forehead 09/22/2016   Trigger ring finger of right hand 06/22/2016   Paroxysmal atrial fibrillation (HCC)    AF (atrial fibrillation) (HCC) 12/10/2015   Eczema 07/10/2015   History of CVA (cerebrovascular accident) 05/04/2015   Tear of medial meniscus of right knee 03/18/2015   Chronic pain of right knee 03/12/2015   Other and unspecified hyperlipidemia 11/25/2013   Nasal congestion 11/25/2013   Sinusitis,  chronic 05/09/2013   Chronic low back pain 10/12/2012   Lumbar radiculopathy 10/12/2012   Hypertension associated with diabetes (HCC) 10/12/2012   Depression 10/12/2012   Insomnia 10/12/2012    PCP: Hoy Register, MD  REFERRING PROVIDER: Drake Leach, PA-C  REFERRING DIAG: Lumbar spondylosis; Spinal stenosis of lumbar region, unspecified whether neurogenic claudication present; Lumbar radiculopathy; DDD (degenerative disc disease), lumbar  Rationale for Evaluation and Treatment: Rehabilitation  THERAPY DIAG:  Other low back pain  Muscle weakness (generalized)  Other abnormalities of gait and mobility  ONSET DATE: Chronic   SUBJECTIVE:  SUBJECTIVE STATEMENT: Patient reports lower back is still at an 8/10. He did do some walking yesterday that helped loosen it up some.   Eval: Patient reports lower back pain, he is wearing a back brace. He did have an injection that helped for about a week so is going to have it done again. He reports that his back is feeling stiff and the left sciatic nerve is hurting but the right side is worse. Having burning, tingling, numbness down both legs and pain in the lower back area. He states the pain stays between a 9-10/10 pain.   PERTINENT HISTORY:  See PMH above  PAIN:  Are you having pain? Yes:  NPRS scale: 8/10 Pain location: Lower back, down both legs Pain description: Burning, tingling, numbness Aggravating factors: Sitting, walking, standing, bending Relieving factors: Nothing  PRECAUTIONS: None  PATIENT GOALS: Pain relief   OBJECTIVE:  Note: Objective measures were completed at Evaluation unless otherwise noted. PATIENT SURVEYS:  FOTO 59% functional status   SENSATION: Patient reports generalized sensation changes throughout both legs  MUSCLE  LENGTH: Significant hamstring flexibility deficits bilaterally  POSTURE:  Rounded shoulder and forward head posture, he demonstrates trunk lean to right and consistently shift weight due to pain   PALPATION: Generalized tender to palpation bilateral lumbar paraspinals, gluteal region; lumbar spine CPA limited throughout  LUMBAR ROM:   AROM eval 05/18/2023  Flexion 25% 50%  Extension 0% < 25%  Right lateral flexion 25%   Left lateral flexion 25%   Right rotation 25%   Left rotation 25%    (Blank rows = not tested)  LOWER EXTREMITY ROM:      Hip PROM grossly WFL with report of muscular tightness  LOWER EXTREMITY MMT:    MMT Right eval Left eval  Hip flexion 4- 4-  Hip extension 2 2  Hip abduction 2 2  Hip adduction    Hip internal rotation    Hip external rotation    Knee flexion 4 4  Knee extension 4 4  Ankle dorsiflexion    Ankle plantarflexion    Ankle inversion    Ankle eversion     (Blank rows = not tested)  LUMBAR SPECIAL TESTS:  Lumbar radicular testing positive bilaterally  FUNCTIONAL TESTS:  30 seconds chair stand test: 6 reps with use of UE for support  GAIT: Assistive device utilized: Single point cane Level of assistance: Modified independence Comments: Decreased gait speed, antalgic on the right   TODAY'S TREATMENT:      [facility] Adult PT Treatment:                                                DATE: 05/18/2023 Therapeutic Exercise: NuStep L5 x 5 min with LE/LE to improve mobility and muscle endurance Seated hamstring stretch 2 x 20 sec each LTR x 10 Piriformis stretch 2 x 20 sec each Bridge 2 x 10 Hooklying clamshell with blue 2 x 10   [facility] Adult PT Treatment:                                                DATE: 05/09/23 Therapeutic Exercise: LTR 5 x 5 sec each SKTC stretch 2 x 20 sec each Piriformis stretch  2 x 20 sec each SLR partial range x 10 each Bridge x 10 Seated hamstring stretch 2 x 20 sec each Manual Therapy: STM to the  lumbar paraspinals and QL Skilled palpation of the lumbar paraspinals to identify TrPs Modalities: Estim c TPDN mA 10pps, intensity as tolerated Trigger Point Dry Needling Treatment: Pre-treatment instruction: Patient instructed on dry needling rationale, procedures, and possible side effects including pain during treatment (achy,cramping feeling), bruising, drop of blood, lightheadedness, nausea, sweating. Patient Consent Given: Yes Education handout provided: Yes Muscles treated: Lumbar paraspinals Needle size and number: .30x9mm x 4 Electrical stimulation performed: Yes Parameters:  mA 10 pps, intensity to tolerance Treatment response/outcome: Twitch response elicited Post-treatment instructions: Patient instructed to expect possible mild to moderate muscle soreness later today and/or tomorrow. Patient instructed in methods to reduce muscle soreness and to continue prescribed HEP. If patient was dry needled over the lung field, patient was instructed on signs and symptoms of pneumothorax and, however unlikely, to see immediate medical attention should they occur. Patient was also educated on signs and symptoms of infection and to seek medical attention should they occur. Patient verbalized understanding of these instructions and education.   [facility] Adult PT Treatment:                                                DATE: 04/25/2023 Therapeutic Exercise: LTR 3 x 5 sec each SKTC stretch 2 x 20 sec each Piriformis stretch 2 x 20 sec each SLR partial range x 5 each Bridge x 5 Seated hamstring stretch 2 x 20 sec each  PATIENT EDUCATION:  Education details: HEP Person educated: Patient Education method: Programmer, multimedia, Facilities manager, Actor cues, Verbal cues, and Handouts Education comprehension: verbalized understanding, returned demonstration, verbal cues required, tactile cues required, and needs further education  HOME EXERCISE PROGRAM: Access Code: [email or web address]   ASSESSMENT: CLINICAL  IMPRESSION: Patient tolerated therapy well with no adverse effects. Therapy limited due to patient arriving late. Therapy focused on improving lumbar mobility and flexibility, and progressing core and hip strengthening with good tolerance. He does exhibit a slight improvement in lumbar motion. Updated his HEP to progress strengthening for home. Patient would benefit from continued skilled PT to progress his mobility and strength in order to reduce pain and maximize functional ability.   EVAL: Patient is a 62 y.o. male who was seen today for physical therapy evaluation and treatment for chronic lower back and bilateral radicular pain. He demonstrates significant limitations in his lumbar mobility and general flexibility of the hips and LE. He exhibit gross strength deficits of the core and hip region. All impairments are impacting his functional ability.    OBJECTIVE IMPAIRMENTS: Abnormal gait, decreased activity tolerance, decreased balance, difficulty walking, decreased ROM, decreased strength, hypomobility, increased muscle spasms, impaired flexibility, impaired sensation, postural dysfunction, and pain.   ACTIVITY LIMITATIONS: carrying, lifting, bending, sitting, standing, squatting, transfers, bed mobility, dressing, and locomotion level  PARTICIPATION LIMITATIONS: meal prep, cleaning, laundry, driving, shopping, and community activity  PERSONAL FACTORS: Fitness, Past/current experiences, Time since onset of injury/illness/exacerbation, and 3+ comorbidities: see PMH above  are also affecting patient's functional outcome.    GOALS: Goals reviewed with patient? Yes  SHORT TERM GOALS: Target date: 05/23/2023  Patient will be I with initial HEP in order to progress with therapy. Baseline: HEP provided at eval Goal status: INITIAL  2.  Patient will report lower back and leg pain </= 7/10 in order to reduce functional limitations Baseline: 9-10/10 pain Goal status: INITIAL  LONG TERM GOALS:  Target date: 06/20/2023  Patient will be I with final HEP to maintain progress from PT. Baseline: HEP provided at eval Goal status: INITIAL  2.  Patient will report >/= 66% status on FOTO to indicate improved functional ability. Baseline: 59% functional status Goal status: INITIAL  3.  Patient will be able to walk >/= 30 minutes without requiring rest due to pain in order to improve community access  Baseline: patient only able to walk short distances due to pain  Goal status: INITIAL  4.  Patient will report </= 4/10 pain level with household activity and ambulation to reduce functional limitation   Baseline: 9-10/10 pain Goal status: INITIAL  5.  Patient will demonstrate gluteal strength >/= 4-/5 MMT in order to improve standing and walking tolerance  Baseline: 2/5 MMT  Goal status: INITIAL   PLAN: PT FREQUENCY: 1-2x/week  PT DURATION: 8 weeks  PLANNED INTERVENTIONS: Therapeutic exercises, Therapeutic activity, Neuromuscular re-education, Balance training, Gait training, Patient/Family education, Self Care, Joint mobilization, Joint manipulation, Aquatic Therapy, Dry Needling, Electrical stimulation, Spinal manipulation, Spinal mobilization, Cryotherapy, Moist heat, Taping, Traction, Ionotophoresis 4mg /ml Dexamethasone, Manual therapy, and Re-evaluation.  PLAN FOR NEXT SESSION: Review HEP and progress PRN, manual/TPDN for lumbar region, progress lumbar mobility and LE stretching, progress core/hip strengthening   Rosana Hoes, PT, DPT, LAT, ATC 05/18/23  9:39 AM Phone: [phone] Fax: [phone]

## 2023-05-18 NOTE — Progress Notes (Signed)
The patient is a 62 year old gentleman well-known to [place].  He has well-documented significant arthritis in his right knee.  He comes in today requesting an aspiration of fluid from that right knee as well as a steroid injection.  He has had a recent fall.  He is also appropriately inquiring about hyaluronic acid.  On examination he has varus malalignment of his right knee with a mild effusion.  There is pain throughout the arc of motion of that knee with significant medial joint line tenderness and patellofemoral crepitation.  The knee feels ligamentously stable.  We did aspirate 20 to 30 cc of fluid from his right knee and placed a steroid in his right knee today.  He is definitely candidate for hyaluronic acid for that knee to treat the long-term pain from osteoarthritis.  We will see if we get this ordered and approved for him.  He agrees with this treatment plan.  This patient is diagnosed with osteoarthritis of the knee(s).    Radiographs show evidence of joint space narrowing, osteophytes, subchondral sclerosis and/or subchondral cysts.  This patient has knee pain which interferes with functional and activities of daily living.    This patient has experienced inadequate response, adverse effects and/or intolerance with conservative treatments such as acetaminophen, NSAIDS, topical creams, physical therapy or regular exercise, knee bracing and/or weight loss.   This patient has experienced inadequate response or has a contraindication to intra articular steroid injections for at least 3 months.   This patient is not scheduled to have a total knee replacement within 6 months of starting treatment with viscosupplementation.     Procedure Note  [name]             Date of Birth: [birth year]           MRN: [id number]             Visit Date: 05/18/2023  Procedures: Visit Diagnoses:  1. Unilateral primary osteoarthritis, right knee   2. Chronic pain of right knee     Large  Joint Inj: R knee on 05/18/2023 4:58 PM Indications: diagnostic evaluation and pain Details: 22 G 1.5 in needle, superolateral approach  Arthrogram: No  Medications: 3 mL lidocaine 1 %; 40 mg methylPREDNISolone acetate 40 MG/ML Outcome: tolerated well, no immediate complications Procedure, treatment alternatives, risks and benefits explained, specific risks discussed. Consent was given by the patient. Immediately prior to procedure a time out was called to verify the correct patient, procedure, equipment, support staff and site/side marked as required. Patient was prepped and draped in the usual sterile fashion.

## 2023-05-19 ENCOUNTER — Other Ambulatory Visit: Payer: Self-pay

## 2023-05-19 ENCOUNTER — Encounter: Payer: Self-pay | Admitting: Orthopaedic Surgery

## 2023-05-19 ENCOUNTER — Ambulatory Visit (INDEPENDENT_AMBULATORY_CARE_PROVIDER_SITE_OTHER): Payer: Medicaid Other | Admitting: Orthopaedic Surgery

## 2023-05-19 VITALS — BP 172/104 | HR 90 | Ht 73.0 in | Wt 245.0 lb

## 2023-05-19 DIAGNOSIS — M25511 Pain in right shoulder: Secondary | ICD-10-CM

## 2023-05-19 DIAGNOSIS — G8929 Other chronic pain: Secondary | ICD-10-CM | POA: Diagnosis not present

## 2023-05-19 NOTE — Telephone Encounter (Signed)
Talked with patient and advised him that Medicaid does not cover any gel injections.

## 2023-05-19 NOTE — Telephone Encounter (Signed)
Patient voiced that he understands

## 2023-05-19 NOTE — Progress Notes (Signed)
Office Visit Note   [name]           Date of Birth: [birth year]           MRN: [id number] Visit Date: 05/19/2023              Requested by: Hoy Register, MD [id number] [place] [address] [place],  [place] [address] PCP: Hoy Register, MD   Assessment & Plan: Visit Diagnoses:  1. Chronic right shoulder pain     Plan: Recheck patient 3 weeks of his blood pressure still not controlled he will have to put off the return visit and I discussed them he needs to get his blood pressure stable particular with past history of CVA before he can proceed with elective shoulder surgery with rotator cuff repair and biceps tenodesis.  Follow-Up Instructions: Return in about 3 weeks (around 06/09/2023).   Orders:  Orders Placed This Encounter  Procedures   XR Shoulder Right   No orders of the defined types were placed in this encounter.     Procedures: No procedures performed   Clinical Data: No additional findings.   Subjective: Chief Complaint  Patient presents with   Right Shoulder - Pain   Left Shoulder - Pain    HPI 62 year old male returns we previously discussed the surgery on his left shoulder where he has biceps tendon subluxation and rotator cuff tear in his left shoulder.  States recently right shoulders been hurting as much as the left.  He has hypertension and just had some changes medication but today's blood pressure is 173/123 and repeat is 172/104.  Review of Systems past history of CVA without weakness.  Positive tobacco abuse's for GERD.  Patient is on oxycodone 5 mg he gets 12 tablets monthly on average.   Objective: Vital Signs: BP (!) 172/104   Pulse 90   Ht 6\' 1"  (1.854 m)   Wt 245 lb (111.1 kg)   BMI 32.32 kg/m   Physical Exam Constitutional:      Appearance: He is well-developed.  HENT:     Head: Normocephalic and atraumatic.     Right Ear: External ear normal.     Left Ear: External ear normal.  Eyes:     Pupils: Pupils are  equal, round, and reactive to light.  Neck:     Thyroid: No thyromegaly.     Trachea: No tracheal deviation.  Cardiovascular:     Rate and Rhythm: Normal rate.  Pulmonary:     Effort: Pulmonary effort is normal.     Breath sounds: No wheezing.  Abdominal:     General: Bowel sounds are normal.     Palpations: Abdomen is soft.  Musculoskeletal:     Cervical back: Neck supple.  Skin:    General: Skin is warm and dry.     Capillary Refill: Capillary refill takes less than 2 seconds.  Neurological:     Mental Status: He is alert and oriented to person, place, and time.  Psychiatric:        Behavior: Behavior normal.        Thought Content: Thought content normal.        Judgment: Judgment normal.     Ortho Exam patient can still get his arm up over his head positive drop arm test positive Neer test on the left.  Tender over the biceps positive Yergason.  Full elbow extension sensation in the hands intact.  Specialty Comments:  CLINICAL DATA:  Chronic neck  pain   EXAM: CT CERVICAL SPINE WITHOUT CONTRAST   TECHNIQUE: Multidetector CT imaging of the cervical spine was performed without intravenous contrast. Multiplanar CT image reconstructions were also generated.   RADIATION DOSE REDUCTION: This exam was performed according to the departmental dose-optimization program which includes automated exposure control, adjustment of the mA and/or kV according to patient size and/or use of iterative reconstruction technique.   COMPARISON:  07/16/2022, 05/15/2022   FINDINGS: Alignment: Facet joints are aligned without dislocation or traumatic listhesis. Dens and lateral masses are aligned.   Skull base and vertebrae: No acute fracture. No primary bone lesion or focal pathologic process.   Soft tissues and spinal canal: No prevertebral fluid or swelling. No visible canal hematoma.   Disc levels: Degenerative disc disease most pronounced at the C3-4 and C5-6 levels, without  interval progression from recent previous CT and MRI.   Upper chest: Negative.   Other: None.   IMPRESSION: 1. No acute fracture or traumatic listhesis of the cervical spine. 2. 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     Electronically Signed   By: Duanne Guess D.O.   On: 07/27/2022 14:03  Imaging: No results found.   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   Pilonidal cyst 09/13/2022   Aortic atherosclerosis (HCC) 06/07/2022   Hypercoagulable state due to paroxysmal atrial fibrillation (HCC) 03/23/2021   Type 2 diabetes mellitus with hyperglycemia, without long-term current use of insulin (HCC) 02/10/2021   Neck pain on left side 03/06/2019   Musculoskeletal chest pain 07/24/2018   Asthma, mild intermittent 05/02/2018   Allergic rhinitis caused by mold 05/02/2018   Tobacco use 05/02/2018   Bilateral carpal tunnel syndrome 01/10/2018   Stroke (HCC)    Schizophrenia (HCC)    Migraine    History of nuclear stress test    History of alcohol abuse    GERD (gastroesophageal reflux disease)    Dysrhythmia    Chronic lower back pain    Anxiety    Arthritis of knee, right 04/12/2017   Bipolar disorder (HCC) 04/03/2017   Lipoma of forehead 09/22/2016   Trigger ring finger of right hand 06/22/2016   Paroxysmal atrial fibrillation (HCC)    AF (atrial fibrillation) (HCC) 12/10/2015   Eczema 07/10/2015   History of CVA (cerebrovascular accident) 05/04/2015   Tear of medial meniscus of right knee 03/18/2015   Chronic pain of right knee 03/12/2015   Other and unspecified hyperlipidemia 11/25/2013   Nasal congestion 11/25/2013   Sinusitis, chronic 05/09/2013   Chronic low back pain 10/12/2012   Lumbar radiculopathy 10/12/2012   Hypertension associated  with diabetes (HCC) 10/12/2012   Depression 10/12/2012   Insomnia 10/12/2012   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)    a.) s/p embolization 08/19/2020: 29 mm RIGHT internal iliac artery aneurysm   Anxiety    Aortic atherosclerosis (HCC)    Bilateral carpal tunnel syndrome 01/10/2018   Bipolar disorder (HCC)    Chorioretinal inflammation of both eyes    a.) on azothioprine   Chronic lower back pain    Coronary artery calcification seen on CT scan    a.) cCTA 04/21/2022: Ca score 20.6 (61st percentile for age/sex match control)   DDD (degenerative  disc disease), cervical    Depression    Diastolic dysfunction    a.) TTE 01/13/2021: EF 60-65%, mod LVH, triv MR, G1DD   GERD (gastroesophageal reflux disease)    Hepatic steatosis    History of alcohol abuse    History of nuclear stress test    Myoview 10/16: EF 50%, diaphragmatic attenuation, no ischemia, low risk   Hypertension    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   a.) CT head 12/25/2012 --> RIGHT basal ganglia hypoattenuation related to remote lacunar infarct   Lipoma    Long term (current) use of anticoagulants    a.) apixaban   Long-term current use of immunomodulator    a.) on azothioprine for peripheral focal chorioretinal inflammation (both eyes)   Marijuana use    Migraine    Moderate persistent asthma with acute exacerbation 05/02/2018   PAF (paroxysmal atrial fibrillation) (HCC) 03/27/2015   a.) CHA2DS2VASc = 5 (HTN, CVA x2, vascular disease history, T2DM);  b.) s/p ablation 10/02/2015; c.) s/p ablation 12/10/2015; d.) s/p DCCV (200 J x 1) 12/11/2015; e.) s/p ablation 04/28/2022; f.) rate/rhythm maintained on oral diltiazem + carvedilol; chronically anticoagulated with apixaban   Pilonidal cyst    Schizophrenia (HCC)    Sciatica neuralgia    T2DM (type 2 diabetes mellitus) (HCC)    Thoracic aortic ectasia (HCC) 01/13/2021   a.) TTE 01/13/2021: Ao root 38  mm, asc Ao 39 mm    Family History  Problem Relation Age of Onset   Heart disease Father    Schizophrenia Sister     Past Surgical History:  Procedure Laterality Date   ATRIAL FIBRILLATION ABLATION  09/22/2015   ATRIAL FIBRILLATION ABLATION N/A 04/28/2022   Procedure: ATRIAL FIBRILLATION ABLATION;  Surgeon: Regan Lemming, MD;  Location: [facility];  Service: Cardiovascular;  Laterality: N/A;   CATARACT EXTRACTION Bilateral    CYST EXCISION  1996-97   surgery back of head    ELECTROPHYSIOLOGIC STUDY N/A 09/22/2015   Procedure: Atrial Fibrillation Ablation;  Surgeon: Will Jorja Loa, MD;  Location: [facility];  Service: Cardiovascular;  Laterality: N/A;   ELECTROPHYSIOLOGIC STUDY N/A 12/10/2015   Procedure: Atrial Fibrillation Ablation;  Surgeon: Will Jorja Loa, MD;  Location: [facility];  Service: Cardiovascular;  Laterality: N/A;   ELECTROPHYSIOLOGIC STUDY N/A 12/11/2015   Procedure: Cardioversion;  Surgeon: Will Jorja Loa, MD;  Location: [facility];  Service: Cardiovascular;  Laterality: N/A;   EMBOLIZATION (CATH LAB) Right 08/19/2020   Procedure: EMBOLIZATION;  Surgeon: Leonie Douglas, MD;  Location: [facility];  Service: Cardiovascular;  Laterality: Right;  hypogastric   EXCISION MASS HEAD N/A 01/06/2017   Procedure: EXCISION MASS FOREHEAD;  Surgeon: Glenna Fellows, MD;  Location: [facility];  Service: Plastics;  Laterality: N/A;   EXCISION MASS UPPER EXTREMETIES Right 08/08/2022   Procedure: EXCISION MASS RIGHT FOREARM;  Surgeon: Betha Loa, MD;  Location: [facility];  Service: Orthopedics;  Laterality: Right;  45 MIN   GANGLION CYST EXCISION Left    INTERCOSTAL NERVE BLOCK  07/19/2003   KNEE ARTHROSCOPY Right 07/18/2014   MASS EXCISION N/A 01/25/2021   Procedure: EXCISION SUBCUTANEOUS VS SUBFASCIAL MASS TORSO 3CM;  Surgeon: Glenna Fellows, MD;  Location: [facility];   Service: Plastics;  Laterality: N/A;   PILONIDAL CYST EXCISION N/A 09/13/2022   Procedure: CYST EXCISION PILONIDAL EXTENSIVE;  Surgeon: Henrene Dodge, MD;  Location: [facility];  Service: General;  Laterality: N/A;  Social History   Occupational History    Comment: disabled  Tobacco Use   Smoking status: Some Days    Current packs/day: 0.00    Types: Cigarettes    Start date: 09/10/1993    Last attempt to quit: 09/10/2022    Years since quitting: 0.6    Passive exposure: Never   Smokeless tobacco: Never   Tobacco comments:    1 cigarettes every day  06-02-2021  Vaping Use   Vaping status: Never Used  Substance and Sexual Activity   Alcohol use: Yes    Alcohol/week: 6.0 standard drinks of alcohol    Types: 6 Cans of beer per week    Comment: 6 pack weekly 05/26/22   Drug use: Yes    Types: Marijuana    Comment: sunday was last time   Sexual activity: Not Currently

## 2023-05-23 ENCOUNTER — Ambulatory Visit: Payer: Medicaid Other | Attending: Orthopedic Surgery

## 2023-05-23 DIAGNOSIS — M6281 Muscle weakness (generalized): Secondary | ICD-10-CM | POA: Insufficient documentation

## 2023-05-23 DIAGNOSIS — R2689 Other abnormalities of gait and mobility: Secondary | ICD-10-CM | POA: Diagnosis present

## 2023-05-23 DIAGNOSIS — M5459 Other low back pain: Secondary | ICD-10-CM | POA: Insufficient documentation

## 2023-05-23 NOTE — Therapy (Signed)
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)    a.) s/p embolization 08/19/2020: 29 mm RIGHT internal iliac artery aneurysm   Anxiety    Aortic atherosclerosis (HCC)    Bilateral carpal tunnel syndrome 01/10/2018   Bipolar disorder (HCC)    Chorioretinal inflammation of both eyes    a.) on azothioprine   Chronic lower back pain    Coronary artery calcification seen on CT scan    a.) cCTA 04/21/2022: Ca score 20.6 (61st percentile for age/sex match control)   DDD (degenerative disc disease), cervical    Depression    Diastolic dysfunction    a.) TTE 01/13/2021: EF 60-65%, mod LVH, triv MR, G1DD   GERD (gastroesophageal reflux disease)    Hepatic steatosis    History of alcohol abuse    History of nuclear stress test    Myoview 10/16: EF 50%, diaphragmatic attenuation, no ischemia, low risk   Hypertension    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   a.) CT head 12/25/2012 --> RIGHT basal ganglia hypoattenuation related to remote lacunar infarct   Lipoma    Long term (current) use of anticoagulants    a.) apixaban   Long-term current use of immunomodulator    a.) on azothioprine for peripheral focal chorioretinal inflammation (both eyes)   Marijuana use    Migraine    Moderate persistent asthma with acute exacerbation 05/02/2018   PAF (paroxysmal atrial fibrillation) (HCC) 03/27/2015   a.) CHA2DS2VASc = 5 (HTN, CVA x2, vascular disease history,  T2DM);  b.) s/p ablation 10/02/2015; c.) s/p ablation 12/10/2015; d.) s/p DCCV (200 J x 1) 12/11/2015; e.) s/p ablation 04/28/2022; f.) rate/rhythm maintained on oral diltiazem + carvedilol; chronically anticoagulated with apixaban   Pilonidal cyst    Schizophrenia (HCC)    Sciatica neuralgia    T2DM (type 2 diabetes mellitus) (HCC)    Thoracic aortic ectasia (HCC) 01/13/2021   a.) TTE 01/13/2021: Ao root 38 mm, asc Ao 39 mm   Past Surgical History:  Procedure Laterality Date   ATRIAL FIBRILLATION ABLATION  09/22/2015   ATRIAL FIBRILLATION ABLATION N/A 04/28/2022   Procedure: ATRIAL FIBRILLATION ABLATION;  Surgeon: Regan Lemming, MD;  Location: [facility];  Service: Cardiovascular;  Laterality: N/A;   CATARACT EXTRACTION Bilateral    CYST EXCISION  1996-97   surgery back of head    ELECTROPHYSIOLOGIC STUDY N/A 09/22/2015   Procedure: Atrial Fibrillation Ablation;  Surgeon: Will Jorja Loa, MD;  Location: [facility];  Service: Cardiovascular;  Laterality: N/A;   ELECTROPHYSIOLOGIC STUDY N/A 12/10/2015   Procedure: Atrial Fibrillation Ablation;  Surgeon: Will Jorja Loa, MD;  Location: [facility];  Service: Cardiovascular;  Laterality: N/A;   ELECTROPHYSIOLOGIC STUDY N/A 12/11/2015   Procedure: Cardioversion;  Surgeon: Will [employer],  MD;  Location: [facility];  Service: Cardiovascular;  Laterality: N/A;   EMBOLIZATION (CATH LAB) Right 08/19/2020   Procedure: EMBOLIZATION;  Surgeon: Leonie Douglas, MD;  Location: [facility];  Service: Cardiovascular;  Laterality: Right;  hypogastric   EXCISION MASS HEAD N/A 01/06/2017   Procedure: EXCISION MASS FOREHEAD;  Surgeon: Glenna Fellows, MD;  Location: [facility];  Service: Plastics;  Laterality: N/A;   EXCISION MASS UPPER EXTREMETIES Right 08/08/2022   Procedure: EXCISION MASS RIGHT FOREARM;  Surgeon: Betha Loa, MD;  Location: [facility];  Service:  Orthopedics;  Laterality: Right;  45 MIN   GANGLION CYST EXCISION Left    INTERCOSTAL NERVE BLOCK  07/19/2003   KNEE ARTHROSCOPY Right 07/18/2014   MASS EXCISION N/A 01/25/2021   Procedure: EXCISION SUBCUTANEOUS VS SUBFASCIAL MASS TORSO 3CM;  Surgeon: Glenna Fellows, MD;  Location: [facility];  Service: Plastics;  Laterality: N/A;   PILONIDAL CYST EXCISION N/A 09/13/2022   Procedure: CYST EXCISION PILONIDAL EXTENSIVE;  Surgeon: Henrene Dodge, MD;  Location: [facility];  Service: General;  Laterality: N/A;   Patient Active Problem List   Diagnosis Date Noted   Complete tear of left rotator cuff 10/28/2022   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   Pilonidal cyst 09/13/2022   Aortic atherosclerosis (HCC) 06/07/2022   Hypercoagulable state due to paroxysmal atrial fibrillation (HCC) 03/23/2021   Type 2 diabetes mellitus with hyperglycemia, without long-term current use of insulin (HCC) 02/10/2021   Neck pain on left side 03/06/2019   Musculoskeletal chest pain 07/24/2018   Asthma, mild intermittent 05/02/2018   Allergic rhinitis caused by mold 05/02/2018   Tobacco use 05/02/2018   Bilateral carpal tunnel syndrome 01/10/2018   Stroke (HCC)    Schizophrenia (HCC)    Migraine    History of nuclear stress test    History of alcohol abuse    GERD (gastroesophageal reflux disease)    Dysrhythmia    Chronic lower back pain    Anxiety    Arthritis of knee, right 04/12/2017   Bipolar disorder (HCC) 04/03/2017   Lipoma of forehead 09/22/2016   Trigger ring finger of right hand 06/22/2016   Paroxysmal atrial fibrillation (HCC)    AF (atrial fibrillation) (HCC) 12/10/2015   Eczema 07/10/2015   History of CVA (cerebrovascular accident) 05/04/2015   Tear of medial meniscus of right knee 03/18/2015   Chronic pain of right knee 03/12/2015   Other and unspecified hyperlipidemia 11/25/2013   Nasal congestion 11/25/2013   Sinusitis, chronic 05/09/2013   Chronic low back  pain 10/12/2012   Lumbar radiculopathy 10/12/2012   Hypertension associated with diabetes (HCC) 10/12/2012   Depression 10/12/2012   Insomnia 10/12/2012    PCP: Hoy Register, MD  REFERRING PROVIDER: Drake Leach, PA-C  REFERRING DIAG: Lumbar spondylosis; Spinal stenosis of lumbar region, unspecified whether neurogenic claudication present; Lumbar radiculopathy; DDD (degenerative disc disease), lumbar  Rationale for Evaluation and Treatment: Rehabilitation  THERAPY DIAG:  Other low back pain  Muscle weakness (generalized)  ONSET DATE: Chronic   SUBJECTIVE:  SUBJECTIVE STATEMENT: Patient reports the TPDN was helpful decreasing his low back pain for several days. Pt notes he completes his HEP daily and sometimes will go for a walk. Pt states he may have L shoulder pain if his blood pressure is able to be lowered. Pt will continue to benefit from skilled PT to address impairments for improved function with see pain.   Eval: Patient reports lower back pain, he is wearing a back brace. He did have an injection that helped for about a week so is going to have it done again. He reports that his back is feeling stiff and the left sciatic nerve is hurting but the right side is worse. Having burning, tingling, numbness down both legs and pain in the lower back area. He states the pain stays between a 9-10/10 pain.   PERTINENT HISTORY:  See PMH above  PAIN:  Are you having pain? Yes:  NPRS scale: 8/10 Pain location: Lower back, down both legs Pain description: Burning, tingling, numbness Aggravating factors: Sitting, walking, standing, bending Relieving factors: Nothing  PRECAUTIONS: None  PATIENT GOALS: Pain relief   OBJECTIVE:  Note: Objective measures were completed at Evaluation unless otherwise  noted. PATIENT SURVEYS:  FOTO 59% functional status   SENSATION: Patient reports generalized sensation changes throughout both legs  MUSCLE LENGTH: Significant hamstring flexibility deficits bilaterally  POSTURE:  Rounded shoulder and forward head posture, he demonstrates trunk lean to right and consistently shift weight due to pain   PALPATION: Generalized tender to palpation bilateral lumbar paraspinals, gluteal region; lumbar spine CPA limited throughout  LUMBAR ROM:   AROM eval 05/18/2023  Flexion 25% 50%  Extension 0% < 25%  Right lateral flexion 25%   Left lateral flexion 25%   Right rotation 25%   Left rotation 25%    (Blank rows = not tested)  LOWER EXTREMITY ROM:      Hip PROM grossly WFL with report of muscular tightness  LOWER EXTREMITY MMT:    MMT Right eval Left eval  Hip flexion 4- 4-  Hip extension 2 2  Hip abduction 2 2  Hip adduction    Hip internal rotation    Hip external rotation    Knee flexion 4 4  Knee extension 4 4  Ankle dorsiflexion    Ankle plantarflexion    Ankle inversion    Ankle eversion     (Blank rows = not tested)  LUMBAR SPECIAL TESTS:  Lumbar radicular testing positive bilaterally  FUNCTIONAL TESTS:  30 seconds chair stand test: 6 reps with use of UE for support  GAIT: Assistive device utilized: Single point cane Level of assistance: Modified independence Comments: Decreased gait speed, antalgic on the right   TODAY'S TREATMENT: [facility] Adult PT Treatment:                                                DATE: 03/23/23 Therapeutic Exercise: Seated hamstring stretch 2 x 20 sec each LTR x 10 Piriformis stretch 2 x 20 sec each Bridge 2 x 10 Hooklying clamshell with blue 2 x 10 SKTC stretch 1 x 20 sec each SLR c pilates ring for core engagement, partial range x 10 each Manual Therapy: STM to the lumbar paraspinals and QL Skilled palpation of the lumbar paraspinals to identify TrPs Modalities: Estim c TPDN mA 10pps,  intensity as tolerated Trigger Point  Dry Needling Treatment: Pre-treatment instruction: Patient instructed on dry needling rationale, procedures, and possible side effects including pain during treatment (achy,cramping feeling), bruising, drop of blood, lightheadedness, nausea, sweating. Patient Consent Given: Yes Education handout provided: Yes Muscles treated: Lumbar paraspinals Needle size and number: .30x65mm x 4 Electrical stimulation performed: Yes Parameters: mA 10 pps, intensity to tolerance Treatment response/outcome: Twitch response elicited Post-treatment instructions: Patient instructed to expect possible mild to moderate muscle soreness later today and/or tomorrow. Patient instructed in methods to reduce muscle soreness and to continue prescribed HEP. If patient was dry needled over the lung field, patient was instructed on signs and symptoms of pneumothorax and, however unlikely, to see immediate medical attention should they occur. Patient was also educated on signs and symptoms of infection and to seek medical attention should they occur. Patient verbalized understanding of these instructions and education.       [facility] Adult PT Treatment:                                                DATE: 05/18/2023 Therapeutic Exercise: NuStep L5 x 5 min with LE/LE to improve mobility and muscle endurance Seated hamstring stretch 2 x 20 sec each LTR x 10 Piriformis stretch 2 x 20 sec each Bridge 2 x 10 Hooklying clamshell with blue 2 x 10   [facility] Adult PT Treatment:                                                DATE: 05/09/23 Therapeutic Exercise: LTR 5 x 5 sec each SKTC stretch 2 x 20 sec each Piriformis stretch 2 x 20 sec each SLR partial range x 10 each Bridge x 10 Seated hamstring stretch 2 x 20 sec each Manual Therapy: STM to the lumbar paraspinals and QL Skilled palpation of the lumbar paraspinals to identify TrPs Modalities: Estim c TPDN mA 10pps, intensity as  tolerated Trigger Point Dry Needling Treatment: Pre-treatment instruction: Patient instructed on dry needling rationale, procedures, and possible side effects including pain during treatment (achy,cramping feeling), bruising, drop of blood, lightheadedness, nausea, sweating. Patient Consent Given: Yes Education handout provided: Yes Muscles treated: Lumbar paraspinals Needle size and number: .30x51mm x 4 Electrical stimulation performed: Yes Parameters:  mA 10 pps, intensity to tolerance Treatment response/outcome: Twitch response elicited Post-treatment instructions: Patient instructed to expect possible mild to moderate muscle soreness later today and/or tomorrow. Patient instructed in methods to reduce muscle soreness and to continue prescribed HEP. If patient was dry needled over the lung field, patient was instructed on signs and symptoms of pneumothorax and, however unlikely, to see immediate medical attention should they occur. Patient was also educated on signs and symptoms of infection and to seek medical attention should they occur. Patient verbalized understanding of these instructions and education.   [facility] Adult PT Treatment:                                                DATE: 04/25/2023 Therapeutic Exercise: LTR 3 x 5 sec each SKTC stretch 2 x 20 sec each Piriformis stretch 2 x 20 sec  each SLR partial range x 5 each Bridge x 5 Seated hamstring stretch 2 x 20 sec each  PATIENT EDUCATION:  Education details: HEP Person educated: Patient Education method: Programmer, multimedia, Demonstration, Actor cues, Verbal cues, and Handouts Education comprehension: verbalized understanding, returned demonstration, verbal cues required, tactile cues required, and needs further education  HOME EXERCISE PROGRAM: Access Code: [email or web address]   ASSESSMENT: CLINICAL IMPRESSION: Patient tolerated therapy well with no adverse effects. Therapy focused on lumbopelvic mobility and strengthening. Therex was f/b  TPDN c mA estim to address inceased muscle tightness and for pain modulation. Pt will continue to benefit from skilled PT to address impairments for improved back function c minimized pain.   EVAL: Patient is a 62 y.o. male who was seen today for physical therapy evaluation and treatment for chronic lower back and bilateral radicular pain. He demonstrates significant limitations in his lumbar mobility and general flexibility of the hips and LE. He exhibit gross strength deficits of the core and hip region. All impairments are impacting his functional ability.   OBJECTIVE IMPAIRMENTS: Abnormal gait, decreased activity tolerance, decreased balance, difficulty walking, decreased ROM, decreased strength, hypomobility, increased muscle spasms, impaired flexibility, impaired sensation, postural dysfunction, and pain.   ACTIVITY LIMITATIONS: carrying, lifting, bending, sitting, standing, squatting, transfers, bed mobility, dressing, and locomotion level  PARTICIPATION LIMITATIONS: meal prep, cleaning, laundry, driving, shopping, and community activity  PERSONAL FACTORS: Fitness, Past/current experiences, Time since onset of injury/illness/exacerbation, and 3+ comorbidities: see PMH above  are also affecting patient's functional outcome.    GOALS: Goals reviewed with patient? Yes  SHORT TERM GOALS: Target date: 05/23/2023  Patient will be I with initial HEP in order to progress with therapy. Baseline: HEP provided at eval 05/23/23: pt completed therex/HEP properly and reports daily completion Goal status: MET  2.  Patient will report lower back and leg pain </= 7/10 in order to reduce functional limitations Baseline: 9-10/10 pain 05/23/23: 6-8/10 Goal status: ONGOING  LONG TERM GOALS: Target date: 06/20/2023  Patient will be I with final HEP to maintain progress from PT. Baseline: HEP provided at eval Goal status: INITIAL  2.  Patient will report >/= 66% status on FOTO to indicate improved  functional ability. Baseline: 59% functional status Goal status: INITIAL  3.  Patient will be able to walk >/= 30 minutes without requiring rest due to pain in order to improve community access  Baseline: patient only able to walk short distances due to pain  Goal status: INITIAL  4.  Patient will report </= 4/10 pain level with household activity and ambulation to reduce functional limitation   Baseline: 9-10/10 pain Goal status: INITIAL  5.  Patient will demonstrate gluteal strength >/= 4-/5 MMT in order to improve standing and walking tolerance  Baseline: 2/5 MMT  Goal status: INITIAL   PLAN: PT FREQUENCY: 1-2x/week  PT DURATION: 8 weeks  PLANNED INTERVENTIONS: Therapeutic exercises, Therapeutic activity, Neuromuscular re-education, Balance training, Gait training, Patient/Family education, Self Care, Joint mobilization, Joint manipulation, Aquatic Therapy, Dry Needling, Electrical stimulation, Spinal manipulation, Spinal mobilization, Cryotherapy, Moist heat, Taping, Traction, Ionotophoresis 4mg /ml Dexamethasone, Manual therapy, and Re-evaluation.  PLAN FOR NEXT SESSION: Review HEP and progress PRN, manual/TPDN for lumbar region, progress lumbar mobility and LE stretching, progress core/hip strengthening   .Syris Brookens MS, PT 05/23/23 11:34 AM

## 2023-05-24 ENCOUNTER — Ambulatory Visit: Payer: Medicaid Other | Attending: Family Medicine

## 2023-05-24 DIAGNOSIS — E1169 Type 2 diabetes mellitus with other specified complication: Secondary | ICD-10-CM | POA: Diagnosis not present

## 2023-05-25 ENCOUNTER — Ambulatory Visit: Payer: Medicaid Other | Admitting: Physical Therapy

## 2023-05-25 ENCOUNTER — Encounter: Payer: Self-pay | Admitting: Physical Therapy

## 2023-05-25 ENCOUNTER — Other Ambulatory Visit: Payer: Self-pay

## 2023-05-25 DIAGNOSIS — M5459 Other low back pain: Secondary | ICD-10-CM

## 2023-05-25 DIAGNOSIS — M6281 Muscle weakness (generalized): Secondary | ICD-10-CM

## 2023-05-25 DIAGNOSIS — R2689 Other abnormalities of gait and mobility: Secondary | ICD-10-CM

## 2023-05-25 LAB — CMP14+EGFR
ALT: 19 [IU]/L (ref 0–44)
AST: 12 [IU]/L (ref 0–40)
Albumin: 3.9 g/dL (ref 3.9–4.9)
Alkaline Phosphatase: 117 [IU]/L (ref 44–121)
BUN/Creatinine Ratio: 8 — ABNORMAL LOW (ref 10–24)
BUN: 10 mg/dL (ref 8–27)
Bilirubin Total: <0.2 mg/dL (ref 0.0–1.2)
CO2: 25 mmol/L (ref 20–29)
Calcium: 8.8 mg/dL (ref 8.6–10.2)
Chloride: 104 mmol/L (ref 96–106)
Creatinine, Ser: 1.18 mg/dL (ref 0.76–1.27)
Globulin, Total: 2.7 g/dL (ref 1.5–4.5)
Glucose: 105 mg/dL — ABNORMAL HIGH (ref 70–99)
Potassium: 4.1 mmol/L (ref 3.5–5.2)
Sodium: 141 mmol/L (ref 134–144)
Total Protein: 6.6 g/dL (ref 6.0–8.5)
eGFR: 70 mL/min/{1.73_m2} (ref >59)

## 2023-05-25 LAB — LP+NON-HDL CHOLESTEROL
Cholesterol, Total: 148 mg/dL (ref 100–199)
HDL: 42 mg/dL (ref >39)
LDL Chol Calc (NIH): 79 mg/dL (ref 0–99)
Total Non-HDL-Chol (LDL+VLDL): 106 mg/dL (ref 0–129)
Triglycerides: 159 mg/dL — ABNORMAL HIGH (ref 0–149)
VLDL Cholesterol Cal: 27 mg/dL (ref 5–40)

## 2023-05-25 NOTE — Therapy (Signed)
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)    a.) s/p embolization 08/19/2020: 29 mm RIGHT internal iliac artery aneurysm   Anxiety    Aortic atherosclerosis (HCC)    Bilateral carpal tunnel syndrome 01/10/2018   Bipolar disorder (HCC)    Chorioretinal inflammation of both eyes    a.) on azothioprine   Chronic lower back pain    Coronary artery calcification seen on CT scan    a.) cCTA 04/21/2022: Ca score 20.6 (61st percentile for age/sex match control)   DDD (degenerative disc disease), cervical    Depression    Diastolic dysfunction    a.) TTE 01/13/2021: EF 60-65%, mod LVH, triv MR, G1DD   GERD (gastroesophageal reflux disease)    Hepatic steatosis    History of alcohol abuse    History of nuclear stress test    Myoview 10/16: EF 50%, diaphragmatic attenuation, no ischemia, low risk   Hypertension    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   a.) CT head 12/25/2012 --> RIGHT basal ganglia hypoattenuation related to remote lacunar infarct   Lipoma    Long term (current) use of anticoagulants    a.) apixaban   Long-term current use of immunomodulator    a.) on azothioprine for peripheral focal chorioretinal inflammation (both eyes)   Marijuana use    Migraine    Moderate persistent asthma with acute exacerbation 05/02/2018   PAF (paroxysmal atrial fibrillation) (HCC) 03/27/2015   a.) CHA2DS2VASc = 5  (HTN, CVA x2, vascular disease history, T2DM);  b.) s/p ablation 10/02/2015; c.) s/p ablation 12/10/2015; d.) s/p DCCV (200 J x 1) 12/11/2015; e.) s/p ablation 04/28/2022; f.) rate/rhythm maintained on oral diltiazem + carvedilol; chronically anticoagulated with apixaban   Pilonidal cyst    Schizophrenia (HCC)    Sciatica neuralgia    T2DM (type 2 diabetes mellitus) (HCC)    Thoracic aortic ectasia (HCC) 01/13/2021   a.) TTE 01/13/2021: Ao root 38 mm, asc Ao 39 mm   Past Surgical History:  Procedure Laterality Date   ATRIAL FIBRILLATION ABLATION  09/22/2015   ATRIAL FIBRILLATION ABLATION N/A 04/28/2022   Procedure: ATRIAL FIBRILLATION ABLATION;  Surgeon: Regan Lemming, MD;  Location: [facility];  Service: Cardiovascular;  Laterality: N/A;   CATARACT EXTRACTION Bilateral    CYST EXCISION  1996-97   surgery back of head    ELECTROPHYSIOLOGIC STUDY N/A 09/22/2015   Procedure: Atrial Fibrillation Ablation;  Surgeon: Will Jorja Loa, MD;  Location: [facility];  Service: Cardiovascular;  Laterality: N/A;   ELECTROPHYSIOLOGIC STUDY N/A 12/10/2015   Procedure: Atrial Fibrillation Ablation;  Surgeon: Will Jorja Loa, MD;  Location: [facility];  Service: Cardiovascular;  Laterality: N/A;   ELECTROPHYSIOLOGIC STUDY N/A 12/11/2015  Procedure: Cardioversion;  Surgeon: Will Jorja Loa, MD;  Location: [facility];  Service: Cardiovascular;  Laterality: N/A;   EMBOLIZATION (CATH LAB) Right 08/19/2020   Procedure: EMBOLIZATION;  Surgeon: Leonie Douglas, MD;  Location: [facility];  Service: Cardiovascular;  Laterality: Right;  hypogastric   EXCISION MASS HEAD N/A 01/06/2017   Procedure: EXCISION MASS FOREHEAD;  Surgeon: Glenna Fellows, MD;  Location: [facility];  Service: Plastics;  Laterality: N/A;   EXCISION MASS UPPER EXTREMETIES Right 08/08/2022   Procedure: EXCISION MASS RIGHT FOREARM;  Surgeon: Betha Loa, MD;  Location:  [facility];  Service: Orthopedics;  Laterality: Right;  45 MIN   GANGLION CYST EXCISION Left    INTERCOSTAL NERVE BLOCK  07/19/2003   KNEE ARTHROSCOPY Right 07/18/2014   MASS EXCISION N/A 01/25/2021   Procedure: EXCISION SUBCUTANEOUS VS SUBFASCIAL MASS TORSO 3CM;  Surgeon: Glenna Fellows, MD;  Location: [facility];  Service: Plastics;  Laterality: N/A;   PILONIDAL CYST EXCISION N/A 09/13/2022   Procedure: CYST EXCISION PILONIDAL EXTENSIVE;  Surgeon: Henrene Dodge, MD;  Location: [facility];  Service: General;  Laterality: N/A;   Patient Active Problem List   Diagnosis Date Noted   Complete tear of left rotator cuff 10/28/2022   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   Pilonidal cyst 09/13/2022   Aortic atherosclerosis (HCC) 06/07/2022   Hypercoagulable state due to paroxysmal atrial fibrillation (HCC) 03/23/2021   Type 2 diabetes mellitus with hyperglycemia, without long-term current use of insulin (HCC) 02/10/2021   Neck pain on left side 03/06/2019   Musculoskeletal chest pain 07/24/2018   Asthma, mild intermittent 05/02/2018   Allergic rhinitis caused by mold 05/02/2018   Tobacco use 05/02/2018   Bilateral carpal tunnel syndrome 01/10/2018   Stroke (HCC)    Schizophrenia (HCC)    Migraine    History of nuclear stress test    History of alcohol abuse    GERD (gastroesophageal reflux disease)    Dysrhythmia    Chronic lower back pain    Anxiety    Arthritis of knee, right 04/12/2017   Bipolar disorder (HCC) 04/03/2017   Lipoma of forehead 09/22/2016   Trigger ring finger of right hand 06/22/2016   Paroxysmal atrial fibrillation (HCC)    AF (atrial fibrillation) (HCC) 12/10/2015   Eczema 07/10/2015   History of CVA (cerebrovascular accident) 05/04/2015   Tear of medial meniscus of right knee 03/18/2015   Chronic pain of right knee 03/12/2015   Other and unspecified hyperlipidemia 11/25/2013   Nasal congestion 11/25/2013   Sinusitis,  chronic 05/09/2013   Chronic low back pain 10/12/2012   Lumbar radiculopathy 10/12/2012   Hypertension associated with diabetes (HCC) 10/12/2012   Depression 10/12/2012   Insomnia 10/12/2012    PCP: Hoy Register, MD  REFERRING PROVIDER: Drake Leach, PA-C  REFERRING DIAG: Lumbar spondylosis; Spinal stenosis of lumbar region, unspecified whether neurogenic claudication present; Lumbar radiculopathy; DDD (degenerative disc disease), lumbar  Rationale for Evaluation and Treatment: Rehabilitation  THERAPY DIAG:  Other low back pain  Muscle weakness (generalized)  Other abnormalities of gait and mobility  ONSET DATE: Chronic   SUBJECTIVE:  SUBJECTIVE STATEMENT: Patient reports he almost didn't come in today because the right sciatic nerve is acting up.   Eval: Patient reports lower back pain, he is wearing a back brace. He did have an injection that helped for about a week so is going to have it done again. He reports that his back is feeling stiff and the left sciatic nerve is hurting but the right side is worse. Having burning, tingling, numbness down both legs and pain in the lower back area. He states the pain stays between a 9-10/10 pain.   PERTINENT HISTORY:  See PMH above  PAIN:  Are you having pain? Yes:  NPRS scale: 9/10 Pain location: Lower back, down both legs Pain description: Burning, tingling, numbness Aggravating factors: Sitting, walking, standing, bending Relieving factors: Nothing  PRECAUTIONS: None  PATIENT GOALS: Pain relief   OBJECTIVE:  Note: Objective measures were completed at Evaluation unless otherwise noted. PATIENT SURVEYS:  FOTO 59% functional status   SENSATION: Patient reports generalized sensation changes throughout both legs  MUSCLE  LENGTH: Significant hamstring flexibility deficits bilaterally  POSTURE:  Rounded shoulder and forward head posture, he demonstrates trunk lean to right and consistently shift weight due to pain   PALPATION: Generalized tender to palpation bilateral lumbar paraspinals, gluteal region; lumbar spine CPA limited throughout  LUMBAR ROM:   AROM eval 05/18/2023 05/25/2023  Flexion 25% 50% 50%  Extension 0% < 25% < 25%  Right lateral flexion 25%    Left lateral flexion 25%    Right rotation 25%  50%  Left rotation 25%  50%   (Blank rows = not tested)  LOWER EXTREMITY ROM:      Hip PROM grossly WFL with report of muscular tightness  LOWER EXTREMITY MMT:    MMT Right eval Left eval  Hip flexion 4- 4-  Hip extension 2 2  Hip abduction 2 2  Hip adduction    Hip internal rotation    Hip external rotation    Knee flexion 4 4  Knee extension 4 4  Ankle dorsiflexion    Ankle plantarflexion    Ankle inversion    Ankle eversion     (Blank rows = not tested)  LUMBAR SPECIAL TESTS:  Lumbar radicular testing positive bilaterally  FUNCTIONAL TESTS:  30 seconds chair stand test: 6 reps with use of UE for support  GAIT: Assistive device utilized: Single point cane Level of assistance: Modified independence Comments: Decreased gait speed, antalgic on the right   TODAY'S TREATMENT: [facility] Adult PT Treatment:                                                DATE: 05/25/2023 Therapeutic Exercise: Piriformis stretch 3 x 20 sec each Seated hamstring stretch 2 x 20 sec each LTR 5 x 5 sec each Supine DKTC with legs on physional 5 x 10 sec PPT 10 x 5 sec Hooklying clamshell with blue 2 x 10 SLR with pilates ring press 2 x 10 each Seated lumbar flexion stretch with physioball 5 x 5 sec Seated hamstring stretch 2 x 20 sec Manual: LAD for right LE x 5 bouts   [facility] Adult PT Treatment:  DATE: 05/23/23 Therapeutic Exercise: Seated hamstring  stretch 2 x 20 sec each LTR x 10 Piriformis stretch 2 x 20 sec each Bridge 2 x 10 Hooklying clamshell with blue 2 x 10 SKTC stretch 1 x 20 sec each SLR c pilates ring for core engagement, partial range x 10 each Manual Therapy: STM to the lumbar paraspinals and QL Skilled palpation of the lumbar paraspinals to identify TrPs Modalities: Estim c TPDN mA 10pps, intensity as tolerated Trigger Point Dry Needling Treatment: Pre-treatment instruction: Patient instructed on dry needling rationale, procedures, and possible side effects including pain during treatment (achy,cramping feeling), bruising, drop of blood, lightheadedness, nausea, sweating. Patient Consent Given: Yes Education handout provided: Yes Muscles treated: Lumbar paraspinals Needle size and number: .30x68mm x 4 Electrical stimulation performed: Yes Parameters: mA 10 pps, intensity to tolerance Treatment response/outcome: Twitch response elicited Post-treatment instructions: Patient instructed to expect possible mild to moderate muscle soreness later today and/or tomorrow. Patient instructed in methods to reduce muscle soreness and to continue prescribed HEP. If patient was dry needled over the lung field, patient was instructed on signs and symptoms of pneumothorax and, however unlikely, to see immediate medical attention should they occur. Patient was also educated on signs and symptoms of infection and to seek medical attention should they occur. Patient verbalized understanding of these instructions and education.       [facility] Adult PT Treatment:                                                DATE: 05/18/2023 Therapeutic Exercise: NuStep L5 x 5 min with LE/LE to improve mobility and muscle endurance Seated hamstring stretch 2 x 20 sec each LTR x 10 Piriformis stretch 2 x 20 sec each Bridge 2 x 10 Hooklying clamshell with blue 2 x 10  [facility] Adult PT Treatment:                                                DATE:  05/09/23 Therapeutic Exercise: LTR 5 x 5 sec each SKTC stretch 2 x 20 sec each Piriformis stretch 2 x 20 sec each SLR partial range x 10 each Bridge x 10 Seated hamstring stretch 2 x 20 sec each Manual Therapy: STM to the lumbar paraspinals and QL Skilled palpation of the lumbar paraspinals to identify TrPs Modalities: Estim c TPDN mA 10pps, intensity as tolerated Trigger Point Dry Needling Treatment: Pre-treatment instruction: Patient instructed on dry needling rationale, procedures, and possible side effects including pain during treatment (achy,cramping feeling), bruising, drop of blood, lightheadedness, nausea, sweating. Patient Consent Given: Yes Education handout provided: Yes Muscles treated: Lumbar paraspinals Needle size and number: .30x3mm x 4 Electrical stimulation performed: Yes Parameters:  mA 10 pps, intensity to tolerance Treatment response/outcome: Twitch response elicited Post-treatment instructions: Patient instructed to expect possible mild to moderate muscle soreness later today and/or tomorrow. Patient instructed in methods to reduce muscle soreness and to continue prescribed HEP. If patient was dry needled over the lung field, patient was instructed on signs and symptoms of pneumothorax and, however unlikely, to see immediate medical attention should they occur. Patient was also educated on signs and symptoms of infection and to seek medical attention should they occur. Patient verbalized understanding  of these instructions and education.   PATIENT EDUCATION:  Education details: HEP Person educated: Patient Education method: Solicitor, Actor cues, Verbal cues Education comprehension: verbalized understanding, returned demonstration, verbal cues required, tactile cues required, and needs further education  HOME EXERCISE PROGRAM: Access Code: [email or web address]   ASSESSMENT: CLINICAL IMPRESSION: Patient tolerated therapy well with no adverse effects.  Therapy limited as patient arrived late, focused on lumbar mobility and progress core and hip strengthening with good tolerance. He had difficulty coordinating PPT this visit so provided tactile and verbal cueing with patient able to perform correctly following cues. He demonstrates a slight improvement in lumbar mobility this visit and did report pain went from 9/10 to a 6/10 following therapy. No changes made to HEP this visit. Patient would benefit from continued skilled PT to progress his mobility and strength in order to reduce pain and maximize functional ability.    EVAL: Patient is a 62 y.o. male who was seen today for physical therapy evaluation and treatment for chronic lower back and bilateral radicular pain. He demonstrates significant limitations in his lumbar mobility and general flexibility of the hips and LE. He exhibit gross strength deficits of the core and hip region. All impairments are impacting his functional ability.   OBJECTIVE IMPAIRMENTS: Abnormal gait, decreased activity tolerance, decreased balance, difficulty walking, decreased ROM, decreased strength, hypomobility, increased muscle spasms, impaired flexibility, impaired sensation, postural dysfunction, and pain.   ACTIVITY LIMITATIONS: carrying, lifting, bending, sitting, standing, squatting, transfers, bed mobility, dressing, and locomotion level  PARTICIPATION LIMITATIONS: meal prep, cleaning, laundry, driving, shopping, and community activity  PERSONAL FACTORS: Fitness, Past/current experiences, Time since onset of injury/illness/exacerbation, and 3+ comorbidities: see PMH above  are also affecting patient's functional outcome.    GOALS: Goals reviewed with patient? Yes  SHORT TERM GOALS: Target date: 05/23/2023  Patient will be I with initial HEP in order to progress with therapy. Baseline: HEP provided at eval 05/23/23: pt completed therex/HEP properly and reports daily completion Goal status: MET  2.  Patient  will report lower back and leg pain </= 7/10 in order to reduce functional limitations Baseline: 9-10/10 pain 05/23/23: 6-8/10 Goal status: ONGOING  LONG TERM GOALS: Target date: 06/20/2023  Patient will be I with final HEP to maintain progress from PT. Baseline: HEP provided at eval Goal status: INITIAL  2.  Patient will report >/= 66% status on FOTO to indicate improved functional ability. Baseline: 59% functional status Goal status: INITIAL  3.  Patient will be able to walk >/= 30 minutes without requiring rest due to pain in order to improve community access  Baseline: patient only able to walk short distances due to pain  Goal status: INITIAL  4.  Patient will report </= 4/10 pain level with household activity and ambulation to reduce functional limitation   Baseline: 9-10/10 pain Goal status: INITIAL  5.  Patient will demonstrate gluteal strength >/= 4-/5 MMT in order to improve standing and walking tolerance  Baseline: 2/5 MMT  Goal status: INITIAL   PLAN: PT FREQUENCY: 1-2x/week  PT DURATION: 8 weeks  PLANNED INTERVENTIONS: Therapeutic exercises, Therapeutic activity, Neuromuscular re-education, Balance training, Gait training, Patient/Family education, Self Care, Joint mobilization, Joint manipulation, Aquatic Therapy, Dry Needling, Electrical stimulation, Spinal manipulation, Spinal mobilization, Cryotherapy, Moist heat, Taping, Traction, Ionotophoresis 4mg /ml Dexamethasone, Manual therapy, and Re-evaluation.  PLAN FOR NEXT SESSION: Review HEP and progress PRN, manual/TPDN for lumbar region, progress lumbar mobility and LE stretching, progress core/hip strengthening   Rosana Hoes, PT, DPT, LAT,  ATC 05/25/23  9:31 AM Phone: [phone] Fax: [phone]

## 2023-05-30 ENCOUNTER — Telehealth: Payer: Self-pay | Admitting: Physical Therapy

## 2023-05-30 ENCOUNTER — Ambulatory Visit: Payer: Medicaid Other | Admitting: Physical Therapy

## 2023-05-30 ENCOUNTER — Ambulatory Visit: Payer: Medicaid Other | Admitting: Allergy and Immunology

## 2023-05-30 NOTE — Telephone Encounter (Signed)
Attempted to contact patient due to missed PT appointment. Left voicemail informing patient of his missed appointment and next scheduled appointment on 06/01/23. He was advised that per the attendance policy, due to this being his 2nd no show he would be allowed to schedule 1 visit at a time moving forward with PT.   Rosana Hoes, PT, DPT, LAT, ATC 05/30/23  2:52 PM Phone: [phone] Fax: [phone]

## 2023-05-31 ENCOUNTER — Ambulatory Visit (INDEPENDENT_AMBULATORY_CARE_PROVIDER_SITE_OTHER): Payer: Self-pay | Admitting: *Deleted

## 2023-05-31 DIAGNOSIS — J309 Allergic rhinitis, unspecified: Secondary | ICD-10-CM

## 2023-06-01 ENCOUNTER — Encounter: Payer: Self-pay | Admitting: Physical Therapy

## 2023-06-01 ENCOUNTER — Other Ambulatory Visit: Payer: Self-pay

## 2023-06-01 ENCOUNTER — Ambulatory Visit: Payer: Medicaid Other | Admitting: Physical Therapy

## 2023-06-01 DIAGNOSIS — M5459 Other low back pain: Secondary | ICD-10-CM | POA: Diagnosis not present

## 2023-06-01 DIAGNOSIS — M6281 Muscle weakness (generalized): Secondary | ICD-10-CM

## 2023-06-01 DIAGNOSIS — R2689 Other abnormalities of gait and mobility: Secondary | ICD-10-CM

## 2023-06-01 NOTE — Therapy (Addendum)
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)    a.) s/p embolization 08/19/2020: 29 mm RIGHT internal iliac artery aneurysm   Anxiety    Aortic atherosclerosis (HCC)    Bilateral carpal tunnel syndrome 01/10/2018   Bipolar disorder (HCC)    Chorioretinal inflammation of both eyes    a.) on azothioprine   Chronic lower back pain    Coronary artery calcification seen on CT scan    a.) cCTA 04/21/2022: Ca score 20.6 (61st percentile for age/sex match control)   DDD (degenerative disc disease), cervical    Depression    Diastolic dysfunction    a.) TTE 01/13/2021: EF 60-65%, mod LVH, triv MR, G1DD   GERD (gastroesophageal reflux disease)    Hepatic steatosis    History of alcohol abuse    History of nuclear stress test    Myoview 10/16: EF 50%, diaphragmatic attenuation, no ischemia, low risk   Hypertension    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   a.) CT head 12/25/2012 --> RIGHT basal ganglia hypoattenuation related to remote lacunar infarct   Lipoma    Long term (current) use of anticoagulants    a.) apixaban   Long-term current use of immunomodulator    a.) on azothioprine for peripheral focal chorioretinal inflammation (both eyes)   Marijuana use    Migraine    Moderate persistent asthma with acute exacerbation 05/02/2018   PAF (paroxysmal atrial fibrillation) (HCC) 03/27/2015   a.)  CHA2DS2VASc = 5 (HTN, CVA x2, vascular disease history, T2DM);  b.) s/p ablation 10/02/2015; c.) s/p ablation 12/10/2015; d.) s/p DCCV (200 J x 1) 12/11/2015; e.) s/p ablation 04/28/2022; f.) rate/rhythm maintained on oral diltiazem + carvedilol; chronically anticoagulated with apixaban   Pilonidal cyst    Schizophrenia (HCC)    Sciatica neuralgia    T2DM (type 2 diabetes mellitus) (HCC)    Thoracic aortic ectasia (HCC) 01/13/2021   a.) TTE 01/13/2021: Ao root 38 mm, asc Ao 39 mm   Past Surgical History:  Procedure Laterality Date   ATRIAL FIBRILLATION ABLATION  09/22/2015   ATRIAL FIBRILLATION ABLATION N/A 04/28/2022   Procedure: ATRIAL FIBRILLATION ABLATION;  Surgeon: Regan Lemming, MD;  Location: [facility];  Service: Cardiovascular;  Laterality: N/A;   CATARACT EXTRACTION Bilateral    CYST EXCISION  1996-97   surgery back of head    ELECTROPHYSIOLOGIC STUDY N/A 09/22/2015   Procedure: Atrial Fibrillation Ablation;  Surgeon: Will Jorja Loa, MD;  Location: [facility];  Service: Cardiovascular;  Laterality: N/A;   ELECTROPHYSIOLOGIC STUDY N/A 12/10/2015   Procedure: Atrial Fibrillation Ablation;  Surgeon: Will Jorja Loa, MD;  Location: [facility];  Service: Cardiovascular;  Laterality: N/A;   ELECTROPHYSIOLOGIC  STUDY N/A 12/11/2015   Procedure: Cardioversion;  Surgeon: Will Jorja Loa, MD;  Location: [facility];  Service: Cardiovascular;  Laterality: N/A;   EMBOLIZATION (CATH LAB) Right 08/19/2020   Procedure: EMBOLIZATION;  Surgeon: Leonie Douglas, MD;  Location: [facility];  Service: Cardiovascular;  Laterality: Right;  hypogastric   EXCISION MASS HEAD N/A 01/06/2017   Procedure: EXCISION MASS FOREHEAD;  Surgeon: Glenna Fellows, MD;  Location: [facility];  Service: Plastics;  Laterality: N/A;   EXCISION MASS UPPER EXTREMETIES Right 08/08/2022   Procedure: EXCISION MASS RIGHT FOREARM;  Surgeon: Betha Loa,  MD;  Location: [facility];  Service: Orthopedics;  Laterality: Right;  45 MIN   GANGLION CYST EXCISION Left    INTERCOSTAL NERVE BLOCK  07/19/2003   KNEE ARTHROSCOPY Right 07/18/2014   MASS EXCISION N/A 01/25/2021   Procedure: EXCISION SUBCUTANEOUS VS SUBFASCIAL MASS TORSO 3CM;  Surgeon: Glenna Fellows, MD;  Location: [facility];  Service: Plastics;  Laterality: N/A;   PILONIDAL CYST EXCISION N/A 09/13/2022   Procedure: CYST EXCISION PILONIDAL EXTENSIVE;  Surgeon: Henrene Dodge, MD;  Location: [facility];  Service: General;  Laterality: N/A;   Patient Active Problem List   Diagnosis Date Noted   Complete tear of left rotator cuff 10/28/2022   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   Pilonidal cyst 09/13/2022   Aortic atherosclerosis (HCC) 06/07/2022   Hypercoagulable state due to paroxysmal atrial fibrillation (HCC) 03/23/2021   Type 2 diabetes mellitus with hyperglycemia, without long-term current use of insulin (HCC) 02/10/2021   Neck pain on left side 03/06/2019   Musculoskeletal chest pain 07/24/2018   Asthma, mild intermittent 05/02/2018   Allergic rhinitis caused by mold 05/02/2018   Tobacco use 05/02/2018   Bilateral carpal tunnel syndrome 01/10/2018   Stroke (HCC)    Schizophrenia (HCC)    Migraine    History of nuclear stress test    History of alcohol abuse    GERD (gastroesophageal reflux disease)    Dysrhythmia    Chronic lower back pain    Anxiety    Arthritis of knee, right 04/12/2017   Bipolar disorder (HCC) 04/03/2017   Lipoma of forehead 09/22/2016   Trigger ring finger of right hand 06/22/2016   Paroxysmal atrial fibrillation (HCC)    AF (atrial fibrillation) (HCC) 12/10/2015   Eczema 07/10/2015   History of CVA (cerebrovascular accident) 05/04/2015   Tear of medial meniscus of right knee 03/18/2015   Chronic pain of right knee 03/12/2015   Other and unspecified hyperlipidemia 11/25/2013   Nasal congestion 11/25/2013    Sinusitis, chronic 05/09/2013   Chronic low back pain 10/12/2012   Lumbar radiculopathy 10/12/2012   Hypertension associated with diabetes (HCC) 10/12/2012   Depression 10/12/2012   Insomnia 10/12/2012    PCP: Hoy Register, MD  REFERRING PROVIDER: Drake Leach, PA-C  REFERRING DIAG: Lumbar spondylosis; Spinal stenosis of lumbar region, unspecified whether neurogenic claudication present; Lumbar radiculopathy; DDD (degenerative disc disease), lumbar  Rationale for Evaluation and Treatment: Rehabilitation  THERAPY DIAG:  Other low back pain  Muscle weakness (generalized)  Other abnormalities of gait and mobility  ONSET DATE: Chronic   SUBJECTIVE:  SUBJECTIVE STATEMENT: Patient reports that this past Monday he was out in the yard and tripped over a 4x4 and twisted his knee, and fell, now he feels he is back at square one. He also reports his BP has bee real high over the past 2 weeks.  Eval: Patient reports lower back pain, he is wearing a back brace. He did have an injection that helped for about a week so is going to have it done again. He reports that his back is feeling stiff and the left sciatic nerve is hurting but the right side is worse. Having burning, tingling, numbness down both legs and pain in the lower back area. He states the pain stays between a 9-10/10 pain.   PERTINENT HISTORY:  See PMH above  PAIN:  Are you having pain? Yes:  NPRS scale: 9/10 Pain location: Lower back Pain description: Burning, tingling, numbness Aggravating factors: Sitting, walking, standing, bending Relieving factors: Nothing  PRECAUTIONS: None  PATIENT GOALS: Pain relief   OBJECTIVE:  Note: Objective measures were completed at Evaluation unless otherwise noted. BP ASSESSMENT  9:00 am: BP  154/105, HR 95 with patient seated and cuff on left upper arm  9:10 am: BP 152/104, HR 93 with patient seated and cuff on left upper arm  PATIENT SURVEYS:  FOTO 59% functional status  06/01/2023: 45%   SENSATION: Patient reports generalized sensation changes throughout both legs  MUSCLE LENGTH: Significant hamstring flexibility deficits bilaterally  POSTURE:  Rounded shoulder and forward head posture, he demonstrates trunk lean to right and consistently shift weight due to pain   PALPATION: Generalized tender to palpation bilateral lumbar paraspinals, gluteal region; lumbar spine CPA limited throughout  LUMBAR ROM:   AROM eval 05/18/2023 05/25/2023  Flexion 25% 50% 50%  Extension 0% < 25% < 25%  Right lateral flexion 25%    Left lateral flexion 25%    Right rotation 25%  50%  Left rotation 25%  50%   (Blank rows = not tested)  LOWER EXTREMITY ROM:      Hip PROM grossly WFL with report of muscular tightness  LOWER EXTREMITY MMT:    MMT Right eval Left eval  Hip flexion 4- 4-  Hip extension 2 2  Hip abduction 2 2  Hip adduction    Hip internal rotation    Hip external rotation    Knee flexion 4 4  Knee extension 4 4  Ankle dorsiflexion    Ankle plantarflexion    Ankle inversion    Ankle eversion     (Blank rows = not tested)  LUMBAR SPECIAL TESTS:  Lumbar radicular testing positive bilaterally  FUNCTIONAL TESTS:  30 seconds chair stand test: 6 reps with use of UE for support  GAIT: Assistive device utilized: Single point cane Level of assistance: Modified independence Comments: Decreased gait speed, antalgic on the right   TODAY'S TREATMENT: [facility] Adult PT Treatment:                                                DATE: 06/01/2023 Therapeutic Activity: FOTO reassessment and review Self care: BP assessment and education on continued reassessment of BP at home, contacting his PCP regarding elevated BP and methods to manage BP   [facility] Adult PT Treatment:  DATE: 05/25/2023 Therapeutic Exercise: Piriformis stretch 3 x 20 sec each Seated hamstring stretch 2 x 20 sec each LTR 5 x 5 sec each Supine DKTC with legs on physional 5 x 10 sec PPT 10 x 5 sec Hooklying clamshell with blue 2 x 10 SLR with pilates ring press 2 x 10 each Seated lumbar flexion stretch with physioball 5 x 5 sec Seated hamstring stretch 2 x 20 sec Manual: LAD for right LE x 5 bouts  [facility] Adult PT Treatment:                                                DATE: 05/23/23 Therapeutic Exercise: Seated hamstring stretch 2 x 20 sec each LTR x 10 Piriformis stretch 2 x 20 sec each Bridge 2 x 10 Hooklying clamshell with blue 2 x 10 SKTC stretch 1 x 20 sec each SLR c pilates ring for core engagement, partial range x 10 each Manual Therapy: STM to the lumbar paraspinals and QL Skilled palpation of the lumbar paraspinals to identify TrPs Modalities: Estim c TPDN mA 10pps, intensity as tolerated Trigger Point Dry Needling Treatment: Pre-treatment instruction: Patient instructed on dry needling rationale, procedures, and possible side effects including pain during treatment (achy,cramping feeling), bruising, drop of blood, lightheadedness, nausea, sweating. Patient Consent Given: Yes Education handout provided: Yes Muscles treated: Lumbar paraspinals Needle size and number: .30x22mm x 4 Electrical stimulation performed: Yes Parameters: mA 10 pps, intensity to tolerance Treatment response/outcome: Twitch response elicited Post-treatment instructions: Patient instructed to expect possible mild to moderate muscle soreness later today and/or tomorrow. Patient instructed in methods to reduce muscle soreness and to continue prescribed HEP. If patient was dry needled over the lung field, patient was instructed on signs and symptoms of pneumothorax and, however unlikely, to see immediate medical attention should they occur. Patient was also  educated on signs and symptoms of infection and to seek medical attention should they occur. Patient verbalized understanding of these instructions and education.  PATIENT EDUCATION:  Education details: BP assessment and contacting his PCP regarding persistently elevated BP readings, FOTO, HEP Person educated: Patient Education method: Explanation Education comprehension: Verbalized understanding  HOME EXERCISE PROGRAM: Access Code: [phone]   ASSESSMENT: CLINICAL IMPRESSION: Patient arrived to therapy reporting recent fall where he twisted his right knee and now his lower back is more pain and right knee pain. He also exhibits elevated BP this visit with two separate readings so deferred any exercise to avoid exacerbating his blood pressure. Patient did reassess FOTO and reports worsening of lower back functional status likely due to increased pain from recent fall. Patient was educated on frequently assessing his BP at home and contacting his PCP to inform them of his persistent high BP readings and see if they would like to follow-up with him sooner than his next scheduled appointment. Patient was also educated on resuming HEP as tolerated for lower back stretching but to avoid excessive exercise is his BP remains elevated. Patient would benefit from continued skilled PT to progress his mobility and strength in order to reduce pain and maximize functional ability.    EVAL: Patient is a 62 y.o. male who was seen today for physical therapy evaluation and treatment for chronic lower back and bilateral radicular pain. He demonstrates significant limitations in his lumbar mobility and general flexibility of the hips and LE. He exhibit  gross strength deficits of the core and hip region. All impairments are impacting his functional ability.   OBJECTIVE IMPAIRMENTS: Abnormal gait, decreased activity tolerance, decreased balance, difficulty walking, decreased ROM, decreased strength, hypomobility,  increased muscle spasms, impaired flexibility, impaired sensation, postural dysfunction, and pain.   ACTIVITY LIMITATIONS: carrying, lifting, bending, sitting, standing, squatting, transfers, bed mobility, dressing, and locomotion level  PARTICIPATION LIMITATIONS: meal prep, cleaning, laundry, driving, shopping, and community activity  PERSONAL FACTORS: Fitness, Past/current experiences, Time since onset of injury/illness/exacerbation, and 3+ comorbidities: see PMH above  are also affecting patient's functional outcome.    GOALS: Goals reviewed with patient? Yes  SHORT TERM GOALS: Target date: 05/23/2023  Patient will be I with initial HEP in order to progress with therapy. Baseline: HEP provided at eval 05/23/23: pt completed therex/HEP properly and reports daily completion Goal status: MET  2.  Patient will report lower back and leg pain </= 7/10 in order to reduce functional limitations Baseline: 9-10/10 pain 05/23/23: 6-8/10 Goal status: ONGOING  LONG TERM GOALS: Target date: 06/20/2023  Patient will be I with final HEP to maintain progress from PT. Baseline: HEP provided at eval Goal status: INITIAL  2.  Patient will report >/= 66% status on FOTO to indicate improved functional ability. Baseline: 59% functional status 06/01/2023: 45% Goal status: INITIAL  3.  Patient will be able to walk >/= 30 minutes without requiring rest due to pain in order to improve community access  Baseline: patient only able to walk short distances due to pain  Goal status: INITIAL  4.  Patient will report </= 4/10 pain level with household activity and ambulation to reduce functional limitation   Baseline: 9-10/10 pain Goal status: INITIAL  5.  Patient will demonstrate gluteal strength >/= 4-/5 MMT in order to improve standing and walking tolerance  Baseline: 2/5 MMT  Goal status: INITIAL   PLAN: PT FREQUENCY: 1-2x/week  PT DURATION: 8 weeks  PLANNED INTERVENTIONS: Therapeutic  exercises, Therapeutic activity, Neuromuscular re-education, Balance training, Gait training, Patient/Family education, Self Care, Joint mobilization, Joint manipulation, Aquatic Therapy, Dry Needling, Electrical stimulation, Spinal manipulation, Spinal mobilization, Cryotherapy, Moist heat, Taping, Traction, Ionotophoresis 4mg /ml Dexamethasone, Manual therapy, and Re-evaluation.  PLAN FOR NEXT SESSION: Assess BP prior to therapy, review HEP and progress PRN, manual/TPDN for lumbar region, progress lumbar mobility and LE stretching, progress core/hip strengthening   Rosana Hoes, PT, DPT, LAT, ATC 06/01/23  9:54 AM Phone: [phone] Fax: [phone]    PHYSICAL THERAPY DISCHARGE SUMMARY  Visits from Start of Care: 6  Current functional level related to goals / functional outcomes: See above   Remaining deficits: See above   Education / Equipment: HEP   Patient agrees to discharge. Patient goals were not met. Patient is being discharged due to not returning since the last visit.  Rosana Hoes, PT, DPT, LAT, ATC 06/14/23  12:48 PM Phone: [phone] Fax: [phone]

## 2023-06-08 ENCOUNTER — Telehealth: Payer: Self-pay | Admitting: *Deleted

## 2023-06-08 ENCOUNTER — Ambulatory Visit (INDEPENDENT_AMBULATORY_CARE_PROVIDER_SITE_OTHER): Payer: Medicaid Other

## 2023-06-08 DIAGNOSIS — J309 Allergic rhinitis, unspecified: Secondary | ICD-10-CM

## 2023-06-08 NOTE — Telephone Encounter (Signed)
Patient called for results and notified:  Hello, Your labs are normal. -Dr Alvis Lemmings

## 2023-06-09 ENCOUNTER — Ambulatory Visit: Payer: Medicaid Other | Admitting: Orthopaedic Surgery

## 2023-06-09 ENCOUNTER — Encounter: Payer: Self-pay | Admitting: Orthopaedic Surgery

## 2023-06-09 ENCOUNTER — Ambulatory Visit (INDEPENDENT_AMBULATORY_CARE_PROVIDER_SITE_OTHER): Payer: Medicaid Other | Admitting: Orthopaedic Surgery

## 2023-06-09 VITALS — BP 145/97 | HR 91 | Ht 73.0 in | Wt 243.0 lb

## 2023-06-09 DIAGNOSIS — M7522 Bicipital tendinitis, left shoulder: Secondary | ICD-10-CM

## 2023-06-09 DIAGNOSIS — M75122 Complete rotator cuff tear or rupture of left shoulder, not specified as traumatic: Secondary | ICD-10-CM | POA: Diagnosis not present

## 2023-06-09 NOTE — Progress Notes (Unsigned)
Office Visit Note   [name]           Date of Birth: 03-06-1961           MRN: [id number] Visit Date: 06/09/2023              Requested by: Hoy Register, MD [address] [place],  [place] [address] PCP: Hoy Register, MD   Assessment & Plan: Visit Diagnoses: No diagnosis found.  Plan: ***  Follow-Up Instructions: No follow-ups on file.   Orders:  No orders of the defined types were placed in this encounter.  No orders of the defined types were placed in this encounter.     Procedures: No procedures performed   Clinical Data: No additional findings.   Subjective: Chief Complaint  Patient presents with   Right Shoulder - Pain   Left Shoulder - Pain    HPI  Review of Systems   Objective: Vital Signs: BP (!) 145/97   Pulse 91   Ht 6\' 1"  (1.854 m)   Wt 243 lb (110.2 kg)   BMI 32.06 kg/m   Physical Exam  Ortho Exam  Specialty Comments:  CLINICAL DATA:  Chronic neck pain   EXAM: CT CERVICAL SPINE WITHOUT CONTRAST   TECHNIQUE: Multidetector CT imaging of the cervical spine was performed without intravenous contrast. Multiplanar CT image reconstructions were also generated.   RADIATION DOSE REDUCTION: This exam was performed according to the departmental dose-optimization program which includes automated exposure control, adjustment of the mA and/or kV according to patient size and/or use of iterative reconstruction technique.   COMPARISON:  07/16/2022, 05/15/2022   FINDINGS: Alignment: Facet joints are aligned without dislocation or traumatic listhesis. Dens and lateral masses are aligned.   Skull base and vertebrae: No acute fracture. No primary bone lesion or focal pathologic process.   Soft tissues and spinal canal: No prevertebral fluid or swelling. No visible canal hematoma.   Disc levels: Degenerative disc disease most pronounced at the C3-4 and C5-6 levels, without interval progression from  recent previous CT and MRI.   Upper chest: Negative.   Other: None.   IMPRESSION: 1. No acute fracture or traumatic listhesis of the cervical spine. 2. 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     Electronically Signed   By: Duanne Guess D.O.   On: 07/27/2022 14:03  Imaging: No results found.   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   Pilonidal cyst 09/13/2022   Aortic atherosclerosis (HCC) 06/07/2022   Hypercoagulable state due to paroxysmal atrial fibrillation (HCC) 03/23/2021   Type 2 diabetes mellitus with hyperglycemia, without long-term current use of insulin (HCC) 02/10/2021   Neck pain on left side 03/06/2019   Musculoskeletal chest pain 07/24/2018   Asthma, mild intermittent 05/02/2018   Allergic rhinitis caused by mold 05/02/2018   Tobacco use 05/02/2018   Bilateral carpal tunnel syndrome 01/10/2018   Stroke (HCC)    Schizophrenia (HCC)    Migraine    History of nuclear stress test    History of alcohol abuse    GERD (gastroesophageal reflux disease)    Dysrhythmia    Chronic lower back pain    Anxiety    Arthritis of knee, right 04/12/2017   Bipolar disorder (HCC) 04/03/2017  Lipoma of forehead 09/22/2016   Trigger ring finger of right hand 06/22/2016   Paroxysmal atrial fibrillation (HCC)    AF (atrial fibrillation) (HCC) 12/10/2015   Eczema 07/10/2015   History of CVA (cerebrovascular accident) 05/04/2015   Tear of medial meniscus of right knee 03/18/2015   Chronic pain of right knee 03/12/2015   Other and unspecified hyperlipidemia 11/25/2013   Nasal congestion 11/25/2013   Sinusitis, chronic 05/09/2013   Chronic low back pain 10/12/2012   Lumbar radiculopathy 10/12/2012   Hypertension associated with diabetes (HCC)  10/12/2012   Depression 10/12/2012   Insomnia 10/12/2012   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)    a.) s/p embolization 08/19/2020: 29 mm RIGHT internal iliac artery aneurysm   Anxiety    Aortic atherosclerosis (HCC)    Bilateral carpal tunnel syndrome 01/10/2018   Bipolar disorder (HCC)    Chorioretinal inflammation of both eyes    a.) on azothioprine   Chronic lower back pain    Coronary artery calcification seen on CT scan    a.) cCTA 04/21/2022: Ca score 20.6 (61st percentile for age/sex match control)   DDD (degenerative disc disease), cervical    Depression    Diastolic dysfunction    a.) TTE 01/13/2021: EF 60-65%, mod LVH, triv MR, G1DD   GERD (gastroesophageal reflux disease)    Hepatic steatosis    History of alcohol abuse    History of nuclear stress test    Myoview 10/16: EF 50%, diaphragmatic attenuation, no ischemia, low risk   Hypertension    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   a.) CT head 12/25/2012 --> RIGHT basal ganglia hypoattenuation related to remote lacunar infarct   Lipoma    Long term (current) use of anticoagulants    a.) apixaban   Long-term current use of immunomodulator    a.) on azothioprine for peripheral focal chorioretinal inflammation (both eyes)   Marijuana use    Migraine    Moderate persistent asthma with acute exacerbation 05/02/2018   PAF (paroxysmal atrial fibrillation) (HCC) 03/27/2015   a.) CHA2DS2VASc = 5 (HTN, CVA x2, vascular disease history, T2DM);  b.) s/p ablation 10/02/2015; c.) s/p ablation 12/10/2015; d.) s/p DCCV (200 J x 1) 12/11/2015; e.) s/p ablation 04/28/2022; f.) rate/rhythm maintained on oral diltiazem + carvedilol; chronically anticoagulated with apixaban   Pilonidal cyst    Schizophrenia (HCC)    Sciatica neuralgia    T2DM (type 2 diabetes mellitus) (HCC)    Thoracic aortic ectasia (HCC) 01/13/2021   a.) TTE 01/13/2021: Ao root 38 mm, asc Ao 39 mm     Family History  Problem Relation Age of Onset   Heart disease Father    Schizophrenia Sister     Past Surgical History:  Procedure Laterality Date   ATRIAL FIBRILLATION ABLATION  09/22/2015   ATRIAL FIBRILLATION ABLATION N/A 04/28/2022   Procedure: ATRIAL FIBRILLATION ABLATION;  Surgeon: Regan Lemming, MD;  Location: [facility];  Service: Cardiovascular;  Laterality: N/A;   CATARACT EXTRACTION Bilateral    CYST EXCISION  1996-97   surgery back of head    ELECTROPHYSIOLOGIC STUDY N/A 09/22/2015   Procedure: Atrial Fibrillation Ablation;  Surgeon: Will Jorja Loa, MD;  Location: [facility];  Service: Cardiovascular;  Laterality: N/A;   ELECTROPHYSIOLOGIC STUDY N/A 12/10/2015   Procedure: Atrial Fibrillation Ablation;  Surgeon: Will Jorja Loa, MD;  Location: [facility];  Service: Cardiovascular;  Laterality: N/A;   ELECTROPHYSIOLOGIC STUDY N/A 12/11/2015   Procedure: Cardioversion;  Surgeon: Will Jorja Loa, MD;  Location: [facility];  Service: Cardiovascular;  Laterality: N/A;   EMBOLIZATION (CATH LAB) Right 08/19/2020   Procedure: EMBOLIZATION;  Surgeon: Leonie Douglas, MD;  Location: [facility];  Service: Cardiovascular;  Laterality: Right;  hypogastric   EXCISION MASS HEAD N/A 01/06/2017   Procedure: EXCISION MASS FOREHEAD;  Surgeon: Glenna Fellows, MD;  Location: [facility];  Service: Plastics;  Laterality: N/A;   EXCISION MASS UPPER EXTREMETIES Right 08/08/2022   Procedure: EXCISION MASS RIGHT FOREARM;  Surgeon: Betha Loa, MD;  Location: [facility];  Service: Orthopedics;  Laterality: Right;  45 MIN   GANGLION CYST EXCISION Left    INTERCOSTAL NERVE BLOCK  07/19/2003   KNEE ARTHROSCOPY Right 07/18/2014   MASS EXCISION N/A 01/25/2021   Procedure: EXCISION SUBCUTANEOUS VS SUBFASCIAL MASS TORSO 3CM;  Surgeon: Glenna Fellows, MD;  Location: [facility];  Service: Plastics;   Laterality: N/A;   PILONIDAL CYST EXCISION N/A 09/13/2022   Procedure: CYST EXCISION PILONIDAL EXTENSIVE;  Surgeon: Henrene Dodge, MD;  Location: [facility];  Service: General;  Laterality: N/A;   Social History   Occupational History    Comment: disabled  Tobacco Use   Smoking status: Some Days    Current packs/day: 0.00    Types: Cigarettes    Start date: 09/10/1993    Last attempt to quit: 09/10/2022    Years since quitting: 0.7    Passive exposure: Never   Smokeless tobacco: Never   Tobacco comments:    1 cigarettes every day  06-02-2021  Vaping Use   Vaping status: Never Used  Substance and Sexual Activity   Alcohol use: Yes    Alcohol/week: 6.0 standard drinks of alcohol    Types: 6 Cans of beer per week    Comment: 6 pack weekly 05/26/22   Drug use: Yes    Types: Marijuana    Comment: sunday was last time   Sexual activity: Not Currently

## 2023-06-12 DIAGNOSIS — M7522 Bicipital tendinitis, left shoulder: Secondary | ICD-10-CM | POA: Insufficient documentation

## 2023-06-13 ENCOUNTER — Ambulatory Visit (INDEPENDENT_AMBULATORY_CARE_PROVIDER_SITE_OTHER): Payer: Medicaid Other

## 2023-06-13 DIAGNOSIS — J309 Allergic rhinitis, unspecified: Secondary | ICD-10-CM

## 2023-06-14 ENCOUNTER — Ambulatory Visit: Payer: Medicaid Other | Admitting: Physical Therapy

## 2023-06-14 ENCOUNTER — Telehealth: Payer: Self-pay | Admitting: Physical Therapy

## 2023-06-14 NOTE — Telephone Encounter (Signed)
Spoke with patient regarding missed PT appointment. He was informed that this was his 3rd missed PT appointment so would be discharged from PT at this time. He states his blood pressure is still elevated so patient encouraged to follow-up with his PCP next week and once his blood pressure is better controlled he can get a new PT referral to be scheduled for new evaluation. He is also planning to have surgery on his shoulder so would likely need PT for that after surgery. Patient expressed understanding.   Rosana Hoes, PT, DPT, LAT, ATC 06/14/23  10:43 AM Phone: [phone] Fax: [phone]

## 2023-06-19 ENCOUNTER — Encounter: Payer: Self-pay | Admitting: Family Medicine

## 2023-06-19 ENCOUNTER — Ambulatory Visit: Payer: Medicaid Other | Attending: Family Medicine | Admitting: Family Medicine

## 2023-06-19 VITALS — BP 145/92 | HR 94 | Ht 73.0 in | Wt 249.4 lb

## 2023-06-19 DIAGNOSIS — M25512 Pain in left shoulder: Secondary | ICD-10-CM

## 2023-06-19 DIAGNOSIS — E1159 Type 2 diabetes mellitus with other circulatory complications: Secondary | ICD-10-CM | POA: Diagnosis not present

## 2023-06-19 DIAGNOSIS — G8929 Other chronic pain: Secondary | ICD-10-CM | POA: Diagnosis not present

## 2023-06-19 DIAGNOSIS — M5416 Radiculopathy, lumbar region: Secondary | ICD-10-CM | POA: Diagnosis not present

## 2023-06-19 DIAGNOSIS — I152 Hypertension secondary to endocrine disorders: Secondary | ICD-10-CM | POA: Diagnosis not present

## 2023-06-19 NOTE — Progress Notes (Signed)
Subjective:  Patient ID: Jason Stokes, male    DOB: Mar 14, 1961  Age: 62 y.o. MRN: [id number]  CC: Hypertension   HPI Jason Stokes is a 62 y.o. year old male with a history of  hypertension, type 2 diabetes mellitus (diet-controlled A1c 6.5), bipolar disorder, paroxysmal A. 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  He presents for a follow-up visit to monitor his blood pressure prior to a scheduled shoulder surgery. His blood pressure has improved significantly since his last visit, when it was 172/104 and valsartan was added. Today, it is 145/92. He is currently taking valsartan, which he believes has helped to lower his blood pressure.  In addition to his hypertension, the patient also reports issues with his eyes. He describes a sensation of his eyes "turning" and aching, particularly in the corners. He missed a recent appointment with his ophthalmologist at [facility] and plans to reschedule.  The patient also mentions back pain, for which he has previously seen a neurosurgeon. He expresses a desire to revisit this doctor, as he is currently unable to receive pain medication for his back pain.        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)    a.) s/p embolization 08/19/2020: 29 mm RIGHT internal iliac artery aneurysm   Anxiety    Aortic atherosclerosis (HCC)    Bilateral carpal tunnel syndrome 01/10/2018   Bipolar disorder (HCC)    Chorioretinal inflammation of both eyes    a.) on azothioprine   Chronic lower back pain    Coronary artery calcification seen on CT scan    a.) cCTA 04/21/2022: Ca score 20.6 (61st percentile for  age/sex match control)   DDD (degenerative disc disease), cervical    Depression    Diastolic dysfunction    a.) TTE 01/13/2021: EF 60-65%, mod LVH, triv MR, G1DD   GERD (gastroesophageal reflux disease)    Hepatic steatosis    History of alcohol abuse    History of nuclear stress test    Myoview 10/16: EF 50%, diaphragmatic attenuation, no ischemia, low risk   Hypertension    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   a.) CT head 12/25/2012 --> RIGHT basal ganglia hypoattenuation related to remote lacunar infarct   Lipoma    Long term (current) use of anticoagulants    a.) apixaban   Long-term current use of immunomodulator    a.) on azothioprine for peripheral focal chorioretinal inflammation (both eyes)   Marijuana use    Migraine    Moderate persistent asthma with acute exacerbation 05/02/2018   PAF (paroxysmal atrial fibrillation) (HCC) 03/27/2015   a.) CHA2DS2VASc = 5 (HTN, CVA x2, vascular disease history, T2DM);  b.) s/p ablation 10/02/2015; c.) s/p ablation 12/10/2015; d.) s/p DCCV (200 J x 1) 12/11/2015; e.) s/p ablation 04/28/2022; f.) rate/rhythm maintained on oral diltiazem + carvedilol; chronically anticoagulated with apixaban   Pilonidal cyst    Schizophrenia (HCC)    Sciatica neuralgia    T2DM (type 2 diabetes mellitus) (HCC)    Thoracic aortic ectasia (HCC) 01/13/2021   a.) TTE 01/13/2021: Ao root 38 mm, asc Ao 39 mm    Past Surgical History:  Procedure Laterality  Date   ATRIAL FIBRILLATION ABLATION  09/22/2015   ATRIAL FIBRILLATION ABLATION N/A 04/28/2022   Procedure: ATRIAL FIBRILLATION ABLATION;  Surgeon: Regan Lemming, MD;  Location: [facility];  Service: Cardiovascular;  Laterality: N/A;   CATARACT EXTRACTION Bilateral    CYST EXCISION  1996-97   surgery back of head    ELECTROPHYSIOLOGIC STUDY N/A 09/22/2015   Procedure: Atrial Fibrillation Ablation;  Surgeon: Will Jorja Loa, MD;  Location: [facility];  Service: Cardiovascular;   Laterality: N/A;   ELECTROPHYSIOLOGIC STUDY N/A 12/10/2015   Procedure: Atrial Fibrillation Ablation;  Surgeon: Will Jorja Loa, MD;  Location: [facility];  Service: Cardiovascular;  Laterality: N/A;   ELECTROPHYSIOLOGIC STUDY N/A 12/11/2015   Procedure: Cardioversion;  Surgeon: Will Jorja Loa, MD;  Location: [facility];  Service: Cardiovascular;  Laterality: N/A;   EMBOLIZATION (CATH LAB) Right 08/19/2020   Procedure: EMBOLIZATION;  Surgeon: Leonie Douglas, MD;  Location: [facility];  Service: Cardiovascular;  Laterality: Right;  hypogastric   EXCISION MASS HEAD N/A 01/06/2017   Procedure: EXCISION MASS FOREHEAD;  Surgeon: Glenna Fellows, MD;  Location: [facility];  Service: Plastics;  Laterality: N/A;   EXCISION MASS UPPER EXTREMETIES Right 08/08/2022   Procedure: EXCISION MASS RIGHT FOREARM;  Surgeon: Betha Loa, MD;  Location: [facility];  Service: Orthopedics;  Laterality: Right;  45 MIN   GANGLION CYST EXCISION Left    INTERCOSTAL NERVE BLOCK  07/19/2003   KNEE ARTHROSCOPY Right 07/18/2014   MASS EXCISION N/A 01/25/2021   Procedure: EXCISION SUBCUTANEOUS VS SUBFASCIAL MASS TORSO 3CM;  Surgeon: Glenna Fellows, MD;  Location: [facility];  Service: Plastics;  Laterality: N/A;   PILONIDAL CYST EXCISION N/A 09/13/2022   Procedure: CYST EXCISION PILONIDAL EXTENSIVE;  Surgeon: Henrene Dodge, MD;  Location: [facility];  Service: General;  Laterality: N/A;    Family History  Problem Relation Age of Onset   Heart disease Father    Schizophrenia Sister     Social History   Socioeconomic History   Marital status: Single    Spouse name: [name]   Number of children: 3   Years of education: HS   Highest education level: 11th grade  Occupational History    Comment: disabled  Tobacco Use   Smoking status: Some Days    Current packs/day: 0.00    Types: Cigarettes    Start date: 09/10/1993    Last attempt  to quit: 09/10/2022    Years since quitting: 0.7    Passive exposure: Never   Smokeless tobacco: Never   Tobacco comments:    1 cigarettes every day  06-02-2021  Vaping Use   Vaping status: Never Used  Substance and Sexual Activity   Alcohol use: Yes    Alcohol/week: 6.0 standard drinks of alcohol    Types: 6 Cans of beer per week    Comment: 6 pack weekly 05/26/22   Drug use: Yes    Types: Marijuana    Comment: sunday was last time   Sexual activity: Not Currently  Other Topics Concern   Not on file  Social History Narrative   Patient lives at home with [place].    Patient has 3 children 2 step.    Patient has 13 years of schooling.    Patient is right handed.    Social Determinants of Health   Financial Resource Strain: Low Risk  (11/15/2022)   Overall Financial Resource Strain (CARDIA)  Difficulty of Paying Living Expenses: Not hard at all  Food Insecurity: Food Insecurity Present (11/15/2022)   Hunger Vital Sign    Worried About Running Out of Food in the Last Year: Never true    Ran Out of Food in the Last Year: Sometimes true  Transportation Needs: No Transportation Needs (11/15/2022)   PRAPARE - Administrator, Civil Service (Medical): No    Lack of Transportation (Non-Medical): No  Physical Activity: Unknown (11/15/2022)   Exercise Vital Sign    Days of Exercise per Week: 0 days    Minutes of Exercise per Session: Not on file  Stress: No Stress Concern Present (11/15/2022)   [employer] of Occupational Health - Occupational Stress Questionnaire    Feeling of Stress : Not at all  Social Connections: Socially Isolated (11/15/2022)   Social Connection and Isolation Panel [NHANES]    Frequency of Communication with Friends and Family: Twice a week    Frequency of Social Gatherings with Friends and Family: Once a week    Attends Religious Services: Never    Database administrator or Organizations: No    Attends Engineer, structural: Not on  file    Marital Status: Never married    Allergies  Allergen Reactions   Lisinopril Anaphylaxis    Swelling of lips and tongue.   Tylenol [Acetaminophen] Other (See Comments)    Acid reflux    Lyrica [Pregabalin] Palpitations    Outpatient Medications Prior to Visit  Medication Sig Dispense Refill   albuterol (VENTOLIN HFA) 108 (90 Base) MCG/ACT inhaler Inhale 2 puffs into the lungs every 4 (four) hours as needed for wheezing or shortness of breath. 54 g 0   apixaban (ELIQUIS) 5 MG TABS tablet Take 1 tablet (5 mg total) by mouth 2 (two) times daily. 60 tablet 5   atorvastatin (LIPITOR) 40 MG tablet Take 1 tablet (40 mg total) by mouth daily. 90 tablet 1   azaTHIOprine (IMURAN) 50 MG tablet Take 100 mg by mouth every morning.     azelastine (ASTELIN) 0.1 % nasal spray Place 1 spray into both nostrils 2 (two) times daily. 1 spray each nostril twice a day 90 mL 1   brimonidine (ALPHAGAN) 0.2 % ophthalmic solution Place 1 drop into both eyes every 8 (eight) hours.     carvedilol (COREG) 25 MG tablet Take 1 tablet (25 mg total) by mouth 2 (two) times daily. 180 tablet 2   cetirizine (ZYRTEC) 10 MG tablet Take 1 tablet (10 mg total) by mouth daily as needed for allergies (Can take an extra dose during flare ups.). 180 tablet 1   cromolyn (OPTICROM) 4 % ophthalmic solution Place 1 drop into both eyes 4 (four) times daily as needed. 10 mL 5   Difluprednate 0.05 % EMUL Place 1 drop into both eyes 2 (two) times daily.     diltiazem (CARDIZEM CD) 180 MG 24 hr capsule TAKE 1 CAPSULE BY MOUTH EVERY DAY (Patient taking differently: 180 mg every morning. TAKE 1 CAPSULE BY MOUTH EVERY DAY) 90 capsule 2   dorzolamide-timolol (COSOPT) 22.3-6.8 MG/ML ophthalmic solution Place 1 drop into both eyes 2 (two) times daily.     DULoxetine (CYMBALTA) 60 MG capsule Take 1 capsule (60 mg total) by mouth daily. For chronic pain and neuropathy 90 capsule 1   EPINEPHrine 0.3 mg/0.3 mL IJ SOAJ injection Inject 0.3 mg  into the muscle as needed for anaphylaxis. 0.3 mL 1   fluticasone (FLONASE) 50  MCG/ACT nasal spray 1 spray each nostril twice a day (Patient taking differently: Place 1 spray into both nostrils as needed. 1 spray each nostril twice a day) 16 g 5   fluticasone furoate-vilanterol (BREO ELLIPTA) 200-25 MCG/ACT AEPB Inhale 1 puff into the lungs daily. 180 each 1   folic acid (FOLVITE) 1 MG tablet Take 1 mg by mouth daily.     furosemide (LASIX) 20 MG tablet Take 1 tablet (20 mg total) by mouth daily. 30 tablet 3   GARLIC PO Take 1 tablet by mouth daily.     glucose blood (ACCU-CHEK GUIDE) test strip Use to check blood sugar once daily. E11.69 100 each 6   ipratropium (ATROVENT) 0.03 % nasal spray Place 2 sprays into both nostrils 4 (four) times daily as needed for rhinitis. 30 mL 5   latanoprost (XALATAN) 0.005 % ophthalmic solution Place 1 drop into both eyes daily.     methocarbamol (ROBAXIN) 500 MG tablet Take 1 tablet (500 mg total) by mouth every 8 (eight) hours as needed for muscle spasms. 90 tablet 3   methotrexate (RHEUMATREX) 2.5 MG tablet Take by mouth.     Misc. Devices MISC Back brace. Diagnosis chronic back pain 1 each 0   Misc. Devices MISC Left knee brace. Diagnosis L knee pain 1 each 0   montelukast (SINGULAIR) 10 MG tablet Take 1 tablet (10 mg total) by mouth at bedtime. 90 tablet 1   Olopatadine HCl (PATADAY) 0.2 % SOLN Place 1 drop into both eyes daily as needed. 7.5 mL 1   omeprazole (PRILOSEC) 20 MG capsule TAKE 1 CAPSULE BY MOUTH EVERY DAY (Patient taking differently: 20 mg every morning. TAKE 1 CAPSULE BY MOUTH EVERY DAY) 90 capsule 0   oxyCODONE 10 MG TABS Take 1 tablet (10 mg total) by mouth every 4 (four) hours as needed for severe pain. 15 tablet 0   valsartan (DIOVAN) 80 MG tablet Take 1 tablet (80 mg total) by mouth daily. 90 tablet 1   Vitamin D3 (VITAMIN D) 25 MCG tablet Take 1,000 Units by mouth daily.     No facility-administered medications prior to visit.      ROS Review of Systems  Constitutional:  Negative for activity change and appetite change.  HENT:  Negative for sinus pressure and sore throat.   Respiratory:  Negative for chest tightness, shortness of breath and wheezing.   Cardiovascular:  Negative for chest pain and palpitations.  Gastrointestinal:  Negative for abdominal distention, abdominal pain and constipation.  Genitourinary: Negative.   Musculoskeletal:  Positive for back pain.  Psychiatric/Behavioral:  Negative for behavioral problems and dysphoric mood.     Objective:  BP (!) 145/92   Pulse 94   Ht 6\' 1"  (1.854 m)   Wt 249 lb 6.4 oz (113.1 kg)   SpO2 100%   BMI 32.90 kg/m      06/19/2023   11:00 AM 06/09/2023    9:26 AM 05/19/2023    2:12 PM  BP/Weight  Systolic BP 145 145 172  Diastolic BP 92 97 104  Wt. (Lbs) 249.4 243 245  BMI 32.9 kg/m2 32.06 kg/m2 32.32 kg/m2      Physical Exam Constitutional:      Appearance: He is well-developed.  Cardiovascular:     Rate and Rhythm: Normal rate.     Heart sounds: Normal heart sounds. No murmur heard. Pulmonary:     Effort: Pulmonary effort is normal.     Breath sounds: Normal breath sounds. No wheezing  or rales.  Chest:     Chest wall: No tenderness.  Abdominal:     General: Bowel sounds are normal. There is no distension.     Palpations: Abdomen is soft. There is no mass.     Tenderness: There is no abdominal tenderness.  Musculoskeletal:        General: Normal range of motion.     Right lower leg: No edema.     Left lower leg: No edema.  Neurological:     Mental Status: He is alert and oriented to person, place, and time.  Psychiatric:        Mood and Affect: Mood normal.        Latest Ref Rng & Units 05/24/2023   11:09 AM 06/23/2022   10:57 AM 05/26/2022    2:53 PM  CMP  Glucose 70 - 99 mg/dL 829  562  130   BUN 8 - 27 mg/dL 10  <5  8   Creatinine 0.76 - 1.27 mg/dL 8.65  7.84  6.96   Sodium 134 - 144 mmol/L 141  140  134   Potassium 3.5  - 5.2 mmol/L 4.1  3.5  3.8   Chloride 96 - 106 mmol/L 104  110  100   CO2 20 - 29 mmol/L 25  22  22    Calcium 8.6 - 10.2 mg/dL 8.8  8.9  9.2   Total Protein 6.0 - 8.5 g/dL 6.6  6.4  6.9   Total Bilirubin 0.0 - 1.2 mg/dL <2.9  0.4  1.0   Alkaline Phos 44 - 121 IU/L 117  110  466   AST 0 - 40 IU/L 12  16  52   ALT 0 - 44 IU/L 19  13  69     Lipid Panel     Component Value Date/Time   CHOL 148 05/24/2023 1109   TRIG 159 (H) 05/24/2023 1109   HDL 42 05/24/2023 1109   CHOLHDL 5.2 (H) 05/07/2018 1004   CHOLHDL 5.4 10/17/2013 1042   VLDL 36 10/17/2013 1042   LDLCALC 79 05/24/2023 1109    CBC    Component Value Date/Time   WBC 5.0 09/06/2022 1243   RBC 5.22 09/06/2022 1243   HGB 12.0 (L) 09/06/2022 1243   HGB 12.8 (L) 04/15/2022 1226   HCT 39.1 09/06/2022 1243   HCT 40.6 04/15/2022 1226   PLT 155 09/06/2022 1243   PLT 168 04/15/2022 1226   MCV 74.9 (L) 09/06/2022 1243   MCV 86 04/15/2022 1226   MCH 23.0 (L) 09/06/2022 1243   MCHC 30.7 09/06/2022 1243   RDW 15.3 09/06/2022 1243   RDW 16.4 (H) 04/15/2022 1226   LYMPHSABS 1.6 08/08/2019 1430   LYMPHSABS 2.0 09/13/2016 0832   MONOABS 0.7 08/08/2019 1430   EOSABS 0.2 08/08/2019 1430   EOSABS 0.1 09/13/2016 0832   BASOSABS 0.0 08/08/2019 1430   BASOSABS 0.0 09/13/2016 5284    Lab Results  Component Value Date   HGBA1C 6.5 05/17/2023    Assessment & Plan:      Hypertension Improved control with Valsartan.  -Continue Valsartan. -Check potassium levels today and notify patient of results via phone call. -Counseled on blood pressure goal of less than 130/80, low-sodium, DASH diet, medication compliance, 150 minutes of moderate intensity exercise per week. Discussed medication compliance, adverse effects. Patient's blood pressure is now controlled, allowing for shoulder surgery to proceed. -Inform surgeon of improved blood pressure control.  Lumbar radiculopathy Patient reports ongoing back pain, previously  seen by  Neurosurgery for this issue. -Provided patient with contact information for further management.  Eye pain He has been receiving injections in his eyes -Advised patient to follow up with ophthalmology.         No orders of the defined types were placed in this encounter.   Follow-up: Return in about 6 months (around 12/18/2023) for Chronic medical conditions.       Hoy Register, MD, FAAFP. [facility] [place], [place] [phone]   06/19/2023, 11:47 AM

## 2023-06-19 NOTE — Patient Instructions (Addendum)
[address], [place], [place] [address] Hours:  Open ? Closes 5?PM Phone: [phone]   Managing Your Hypertension Hypertension, also called high blood pressure, is when the force of the blood pressing against the walls of the arteries is too strong. Arteries are blood vessels that carry blood from your heart throughout your body. Hypertension forces the heart to work harder to pump blood and may cause the arteries to become narrow or stiff. Understanding blood pressure readings A blood pressure reading includes a higher number over a lower number: The first, or top, number is called the systolic pressure. It is a measure of the pressure in your arteries as your heart beats. The second, or bottom number, is called the diastolic pressure. It is a measure of the pressure in your arteries as the heart relaxes. For most people, a normal blood pressure is below 120/80. Your personal target blood pressure may vary depending on your medical conditions, your age, and other factors. Blood pressure is classified into four stages. Based on your blood pressure reading, your health care provider may use the following stages to determine what type of treatment you need, if any. Systolic pressure and diastolic pressure are measured in a unit called millimeters of mercury (mmHg). Normal Systolic pressure: below 120. Diastolic pressure: below 80. Elevated Systolic pressure: 120-129. Diastolic pressure: below 80. Hypertension stage 1 Systolic pressure: 130-139. Diastolic pressure: 80-89. Hypertension stage 2 Systolic pressure: 140 or above. Diastolic pressure: 90 or above. How can this condition affect me? Managing your hypertension is very important. Over time, hypertension can damage the arteries and decrease blood flow to parts of the body, including the brain, heart, and kidneys. Having untreated or uncontrolled hypertension can lead to: A heart attack. A stroke. A weakened blood vessel  (aneurysm). Heart failure. Kidney damage. Eye damage. Memory and concentration problems. Vascular dementia. What actions can I take to manage this condition? Hypertension can be managed by making lifestyle changes and possibly by taking medicines. Your health care provider will help you make a plan to bring your blood pressure within a normal range. You may be referred for counseling on a healthy diet and physical activity. Nutrition  Eat a diet that is high in fiber and potassium, and low in salt (sodium), added sugar, and fat. An example eating plan is called the DASH diet. DASH stands for Dietary Approaches to Stop Hypertension. To eat this way: Eat plenty of fresh fruits and vegetables. Try to fill one-half of your plate at each meal with fruits and vegetables. Eat whole grains, such as whole-wheat pasta, brown rice, or whole-grain bread. Fill about one-fourth of your plate with whole grains. Eat low-fat dairy products. Avoid fatty cuts of meat, processed or cured meats, and poultry with skin. Fill about one-fourth of your plate with lean proteins such as fish, chicken without skin, beans, eggs, and tofu. Avoid pre-made and processed foods. These tend to be higher in sodium, added sugar, and fat. Reduce your daily sodium intake. Many people with hypertension should eat less than 1,500 mg of sodium a day. Lifestyle  Work with your health care provider to maintain a healthy body weight or to lose weight. Ask what an ideal weight is for you. Get at least 30 minutes of exercise that causes your heart to beat faster (aerobic exercise) most days of the week. Activities may include walking, swimming, or biking. Include exercise to strengthen your muscles (resistance exercise), such as weight lifting, as part of your weekly  exercise routine. Try to do these types of exercises for 30 minutes at least 3 days a week. Do not use any products that contain nicotine or tobacco. These products include  cigarettes, chewing tobacco, and vaping devices, such as e-cigarettes. If you need help quitting, ask your health care provider. Control any long-term (chronic) conditions you have, such as high cholesterol or diabetes. Identify your sources of stress and find ways to manage stress. This may include meditation, deep breathing, or making time for fun activities. Alcohol use Do not drink alcohol if: Your health care provider tells you not to drink. You are pregnant, may be pregnant, or are planning to become pregnant. If you drink alcohol: Limit how much you have to: 0-1 drink a day for women. 0-2 drinks a day for men. Know how much alcohol is in your drink. In the [place] one drink equals one 12 oz bottle of beer (355 mL), one 5 oz glass of wine (148 mL), or one 1 oz glass of hard liquor (44 mL). Medicines Your health care provider may prescribe medicine if lifestyle changes are not enough to get your blood pressure under control and if: Your systolic blood pressure is 130 or higher. Your diastolic blood pressure is 80 or higher. Take medicines only as told by your health care provider. Follow the directions carefully. Blood pressure medicines must be taken as told by your health care provider. The medicine does not work as well when you skip doses. Skipping doses also puts you at risk for problems. Monitoring Before you monitor your blood pressure: Do not smoke, drink caffeinated beverages, or exercise within 30 minutes before taking a measurement. Use the bathroom and empty your bladder (urinate). Sit quietly for at least 5 minutes before taking measurements. Monitor your blood pressure at home as told by your health care provider. To do this: Sit with your back straight and supported. Place your feet flat on the floor. Do not cross your legs. Support your arm on a flat surface, such as a table. Make sure your upper arm is at heart level. Each time you measure, take two or three readings  one minute apart and record the results. You may also need to have your blood pressure checked regularly by your health care provider. General information Talk with your health care provider about your diet, exercise habits, and other lifestyle factors that may be contributing to hypertension. Review all the medicines you take with your health care provider because there may be side effects or interactions. Keep all follow-up visits. Your health care provider can help you create and adjust your plan for managing your high blood pressure. Where to find more information National Heart, Lung, and Blood Institute: [employer]is American Heart Association: www.heart.org Contact a health care provider if: You think you are having a reaction to medicines you have taken. You have repeated (recurrent) headaches. You feel dizzy. You have swelling in your ankles. You have trouble with your vision. Get help right away if: You develop a severe headache or confusion. You have unusual weakness or numbness, or you feel faint. You have severe pain in your chest or abdomen. You vomit repeatedly. You have trouble breathing. These symptoms may be an emergency. Get help right away. Call 911. Do not wait to see if the symptoms will go away. Do not drive yourself to the hospital. Summary Hypertension is when the force of blood pumping through your arteries is too strong. If this condition is not controlled, it may  put you at risk for serious complications. Your personal target blood pressure may vary depending on your medical conditions, your age, and other factors. For most people, a normal blood pressure is less than 120/80. Hypertension is managed by lifestyle changes, medicines, or both. Lifestyle changes to help manage hypertension include losing weight, eating a healthy, low-sodium diet, exercising more, stopping smoking, and limiting alcohol. This information is not intended to replace advice given  to you by your health care provider. Make sure you discuss any questions you have with your health care provider. Document Revised: 03/18/2021 Document Reviewed: 03/18/2021 Elsevier Patient Education  2024 [employer].

## 2023-06-20 ENCOUNTER — Ambulatory Visit: Payer: Medicaid Other | Admitting: Allergy and Immunology

## 2023-06-20 LAB — POTASSIUM: Potassium: 4.1 mmol/L (ref 3.5–5.2)

## 2023-06-21 ENCOUNTER — Telehealth: Payer: Self-pay | Admitting: Family Medicine

## 2023-06-21 NOTE — Telephone Encounter (Signed)
Pt. Given lab results, verbalizes understanding. 

## 2023-06-22 ENCOUNTER — Telehealth: Payer: Self-pay | Admitting: *Deleted

## 2023-06-22 ENCOUNTER — Other Ambulatory Visit: Payer: Self-pay

## 2023-06-22 ENCOUNTER — Encounter (HOSPITAL_BASED_OUTPATIENT_CLINIC_OR_DEPARTMENT_OTHER): Payer: Self-pay | Admitting: Orthopaedic Surgery

## 2023-06-22 NOTE — Telephone Encounter (Signed)
   Pre-operative Risk Assessment    Patient Name: Jason Stokes  DOB: Nov 24, 1960 MRN: [id number]  DATE OF LAST VISIT: 03/23/23 Otilio Saber, PAC DATE OF NEXT VISIT: NONE    Request for Surgical Clearance    Procedure:   LEFT SHOULDER ARTHROPLASTY WITH RCR  Date of Surgery:  Clearance TBD (REQUEST CAME OVER AS TBD; HOWEVER IN EPIC YOU CAN SEE THE DATE OF SURGERY IS 06/26/23) OUR OFFICE JUST RECEIVED THIS REQUEST TODAY)                               Surgeon:  DR. MARK YATES Surgeon's Group or Practice Name:  [facility] AT [facility] Phone number:  [phone] Fax number:  [phone] ATTN: SHERRIE   Type of Clearance Requested:   - Medical  - Pharmacy:  Hold Apixaban (Eliquis) x 3 DAYS PRIOR   Type of Anesthesia:  General  AND BLOCK   Additional requests/questions:    Elpidio Anis   06/22/2023, 5:24 PM

## 2023-06-23 ENCOUNTER — Encounter (HOSPITAL_BASED_OUTPATIENT_CLINIC_OR_DEPARTMENT_OTHER)
Admission: RE | Admit: 2023-06-23 | Discharge: 2023-06-23 | Disposition: A | Discharge status: Home / Self Care | Payer: Medicaid Other | Source: Ambulatory Visit | Attending: Orthopaedic Surgery | Admitting: Orthopaedic Surgery

## 2023-06-23 ENCOUNTER — Ambulatory Visit (INDEPENDENT_AMBULATORY_CARE_PROVIDER_SITE_OTHER): Payer: Medicaid Other | Admitting: *Deleted

## 2023-06-23 ENCOUNTER — Telehealth: Payer: Self-pay

## 2023-06-23 ENCOUNTER — Other Ambulatory Visit: Payer: Self-pay | Admitting: Physician Assistant

## 2023-06-23 ENCOUNTER — Ambulatory Visit: Payer: Medicaid Other | Attending: Cardiovascular Disease

## 2023-06-23 DIAGNOSIS — Z01812 Encounter for preprocedural laboratory examination: Secondary | ICD-10-CM | POA: Insufficient documentation

## 2023-06-23 DIAGNOSIS — J309 Allergic rhinitis, unspecified: Secondary | ICD-10-CM

## 2023-06-23 DIAGNOSIS — Z01810 Encounter for preprocedural cardiovascular examination: Secondary | ICD-10-CM

## 2023-06-23 LAB — BASIC METABOLIC PANEL
Anion gap: 9 (ref 5–15)
BUN: 13 mg/dL (ref 8–23)
CO2: 26 mmol/L (ref 22–32)
Calcium: 9 mg/dL (ref 8.9–10.3)
Chloride: 104 mmol/L (ref 98–111)
Creatinine, Ser: 1.14 mg/dL (ref 0.61–1.24)
GFR, Estimated: >60 mL/min (ref >60)
Glucose, Bld: 129 mg/dL — ABNORMAL HIGH (ref 70–99)
Potassium: 4.3 mmol/L (ref 3.5–5.1)
Sodium: 139 mmol/L (ref 135–145)

## 2023-06-23 NOTE — Telephone Encounter (Signed)
Patient with diagnosis of afib on Eliquis for anticoagulation.    Procedure:  left shoulder arthroplasty with rcr Date of procedure: Clearance TBD (REQUEST CAME OVER AS TBD; HOWEVER IN EPIC YOU CAN SEE THE DATE OF SURGERY IS 06/26/23) OUR OFFICE JUST RECEIVED THIS REQUEST TODAY)       CHA2DS2-VASc Score = 5   This indicates a 7.2% annual risk of stroke. The patient's score is based upon: CHF History: 0 HTN History: 1 Diabetes History: 1 Stroke History: 2 Vascular Disease History: 1 Age Score: 0 Gender Score: 0 Hx of CVA 05/04/2015  CrCl 85 mL/min Platelet count 228 K    Per office protocol, patient can hold Eliquis for 3 days prior to procedure.    Patient previously cleared by Dr. Elberta Fortis to hold 3 days.  **This guidance is not considered finalized until pre-operative APP has relayed final recommendations.**

## 2023-06-23 NOTE — Progress Notes (Signed)
Virtual Visit via Telephone Note   Because of Jason Stokes's co-morbid illnesses, he is at least at moderate risk for complications without adequate follow up.  This format is felt to be most appropriate for this patient at this time.  The patient did not have access to video technology/had technical difficulties with video requiring transitioning to audio format only (telephone).  All issues noted in this document were discussed and addressed.  No physical exam could be performed with this format.  Please refer to the patient's chart for his consent to telehealth for [facility].  Evaluation Performed:  Preoperative cardiovascular risk assessment _____________   Date:  06/23/2023   Patient ID:  Jason Stokes, DOB Jul 10, 1961, MRN [id number] Patient Location:  Home Provider location:   Office  Primary Care Provider:  Hoy Register, MD Primary Cardiologist:  Will Jorja Loa, MD  Chief Complaint / Patient Profile   62 y.o. y/o male with a h/o paroxysmal atrial fibrillation, hypertension, CVA who is pending left shoulder arthroplasty with RCR and presents today for telephonic preoperative cardiovascular risk assessment.  History of Present Illness    Jason Stokes is a 62 y.o. male who presents via audio/video conferencing for a telehealth visit today.  Pt was last seen in cardiology clinic on 03/23/2023 by Otilio Saber, PA-C.  At that time Jason Stokes was doing well .  The patient is now pending procedure as outlined above. Since his last visit, he remains stable from a cardiac standpoint.  Today he denies chest pain, shortness of breath, lower extremity edema, fatigue, palpitations, melena, hematuria, hemoptysis, diaphoresis, weakness, presyncope, syncope, orthopnea, and PND.   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)    a.) s/p embolization 08/19/2020: 29 mm RIGHT internal iliac  artery aneurysm   Anxiety    Aortic atherosclerosis (HCC)    Bilateral carpal tunnel syndrome 01/10/2018   Bipolar disorder (HCC)    Chorioretinal inflammation of both eyes    a.) on azothioprine   Chronic lower back pain    Coronary artery calcification seen on CT scan    a.) cCTA 04/21/2022: Ca score 20.6 (61st percentile for age/sex match control)   DDD (degenerative disc disease), cervical    Depression    Diastolic dysfunction    a.) TTE 01/13/2021: EF 60-65%, mod LVH, triv MR, G1DD   GERD (gastroesophageal reflux disease)    Hepatic steatosis    History of alcohol abuse    History of nuclear stress test    Myoview 10/16: EF 50%, diaphragmatic attenuation, no ischemia, low risk   Hypertension    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   a.) CT head 12/25/2012 --> RIGHT basal ganglia hypoattenuation related to remote lacunar infarct   Lipoma    Long term (current) use of anticoagulants    a.) apixaban   Long-term current use of immunomodulator    a.) on azothioprine for peripheral focal chorioretinal inflammation (both eyes)   Marijuana use    Migraine    Moderate persistent asthma with acute exacerbation 05/02/2018   PAF (paroxysmal atrial fibrillation) (HCC) 03/27/2015   a.) CHA2DS2VASc = 5 (HTN, CVA x2, vascular disease history, T2DM);  b.) s/p ablation 10/02/2015; c.) s/p ablation 12/10/2015; d.) s/p DCCV (200 J x 1) 12/11/2015; e.) s/p ablation 04/28/2022; f.) rate/rhythm maintained on oral diltiazem + carvedilol; chronically anticoagulated with apixaban   Pilonidal cyst  Schizophrenia (HCC)    Sciatica neuralgia    T2DM (type 2 diabetes mellitus) (HCC)    Thoracic aortic ectasia (HCC) 01/13/2021   a.) TTE 01/13/2021: Ao root 38 mm, asc Ao 39 mm   Past Surgical History:  Procedure Laterality Date   ATRIAL FIBRILLATION ABLATION  09/22/2015   ATRIAL FIBRILLATION ABLATION N/A 04/28/2022   Procedure: ATRIAL FIBRILLATION ABLATION;  Surgeon: Regan Lemming, MD;   [facility];  Service: Cardiovascular;  Laterality: N/A;   CATARACT EXTRACTION Bilateral    CYST EXCISION  1996-97   surgery back of head    ELECTROPHYSIOLOGIC STUDY N/A 09/22/2015   Procedure: Atrial Fibrillation Ablation;  Surgeon: Will Jorja Loa, MD;  Location: [facility];  Service: Cardiovascular;  Laterality: N/A;   ELECTROPHYSIOLOGIC STUDY N/A 12/10/2015   Procedure: Atrial Fibrillation Ablation;  Surgeon: Will Jorja Loa, MD;  Location: [facility];  Service: Cardiovascular;  Laterality: N/A;   ELECTROPHYSIOLOGIC STUDY N/A 12/11/2015   Procedure: Cardioversion;  Surgeon: Will Jorja Loa, MD;  Location: [facility];  Service: Cardiovascular;  Laterality: N/A;   EMBOLIZATION (CATH LAB) Right 08/19/2020   Procedure: EMBOLIZATION;  Surgeon: Leonie Douglas, MD;  Location: [facility];  Service: Cardiovascular;  Laterality: Right;  hypogastric   EXCISION MASS HEAD N/A 01/06/2017   Procedure: EXCISION MASS FOREHEAD;  Surgeon: Glenna Fellows, MD;  Location: [facility];  Service: Plastics;  Laterality: N/A;   EXCISION MASS UPPER EXTREMETIES Right 08/08/2022   Procedure: EXCISION MASS RIGHT FOREARM;  Surgeon: Betha Loa, MD;  Location: [facility];  Service: Orthopedics;  Laterality: Right;  45 MIN   GANGLION CYST EXCISION Left    INTERCOSTAL NERVE BLOCK  07/19/2003   KNEE ARTHROSCOPY Right 07/18/2014   MASS EXCISION N/A 01/25/2021   Procedure: EXCISION SUBCUTANEOUS VS SUBFASCIAL MASS TORSO 3CM;  Surgeon: Glenna Fellows, MD;  Location:  SURGERY CENTER;  Service: Plastics;  Laterality: N/A;   PILONIDAL CYST EXCISION N/A 09/13/2022   Procedure: CYST EXCISION PILONIDAL EXTENSIVE;  Surgeon: Henrene Dodge, MD;  Location: [facility];  Service: General;  Laterality: N/A;    Allergies  Allergies  Allergen Reactions   Lisinopril Anaphylaxis    Swelling of lips and tongue.   Tylenol  [Acetaminophen] Other (See Comments)    Acid reflux    Lyrica [Pregabalin] Palpitations    Home Medications    Prior to Admission medications   Medication Sig Start Date End Date Taking? Authorizing Provider  albuterol (VENTOLIN HFA) 108 (90 Base) MCG/ACT inhaler Inhale 2 puffs into the lungs every 4 (four) hours as needed for wheezing or shortness of breath. 11/29/22   Kozlow, Alvira Philips, MD  apixaban (ELIQUIS) 5 MG TABS tablet Take 1 tablet (5 mg total) by mouth 2 (two) times daily. 06/27/22   Camnitz, Andree Coss, MD  atorvastatin (LIPITOR) 40 MG tablet Take 1 tablet (40 mg total) by mouth daily. 05/17/23   Hoy Register, MD  azaTHIOprine (IMURAN) 50 MG tablet Take 100 mg by mouth every morning.    [provider]  azelastine (ASTELIN) 0.1 % nasal spray Place 1 spray into both nostrils 2 (two) times daily. 1 spray each nostril twice a day 11/29/22   Kozlow, Alvira Philips, MD  brimonidine (ALPHAGAN) 0.2 % ophthalmic solution Place 1 drop into both eyes every 8 (eight) hours.    [provider]  carvedilol (COREG) 25 MG tablet Take 1 tablet (25 mg total) by  mouth 2 (two) times daily. 06/27/22   Camnitz, Andree Coss, MD  cetirizine (ZYRTEC) 10 MG tablet Take 1 tablet (10 mg total) by mouth daily as needed for allergies (Can take an extra dose during flare ups.). 11/29/22   Kozlow, Alvira Philips, MD  cromolyn (OPTICROM) 4 % ophthalmic solution Place 1 drop into both eyes 4 (four) times daily as needed. 01/03/23   Kozlow, Alvira Philips, MD  Difluprednate 0.05 % EMUL Place 1 drop into both eyes 2 (two) times daily.    [provider]  diltiazem (CARDIZEM CD) 180 MG 24 hr capsule TAKE 1 CAPSULE BY MOUTH EVERY DAY Patient taking differently: 180 mg every morning. TAKE 1 CAPSULE BY MOUTH EVERY DAY 06/27/22   Camnitz, Andree Coss, MD  dorzolamide-timolol (COSOPT) 22.3-6.8 MG/ML ophthalmic solution Place 1 drop into both eyes 2 (two) times daily.    [provider]  DULoxetine (CYMBALTA) 60 MG  capsule Take 1 capsule (60 mg total) by mouth daily. For chronic pain and neuropathy 08/16/22   Hoy Register, MD  EPINEPHrine 0.3 mg/0.3 mL IJ SOAJ injection Inject 0.3 mg into the muscle as needed for anaphylaxis. 02/10/22   Kozlow, Alvira Philips, MD  fluticasone (FLONASE) 50 MCG/ACT nasal spray 1 spray each nostril twice a day Patient taking differently: Place 1 spray into both nostrils as needed. 1 spray each nostril twice a day 05/31/22   Kozlow, Alvira Philips, MD  fluticasone furoate-vilanterol (BREO ELLIPTA) 200-25 MCG/ACT AEPB Inhale 1 puff into the lungs daily. 11/29/22   Kozlow, Alvira Philips, MD  folic acid (FOLVITE) 1 MG tablet Take 1 mg by mouth daily. 10/16/20   [provider]  furosemide (LASIX) 20 MG tablet Take 1 tablet (20 mg total) by mouth daily. 11/15/22   Hoy Register, MD  GARLIC PO Take 1 tablet by mouth daily.    [provider]  glucose blood (ACCU-CHEK GUIDE) test strip Use to check blood sugar once daily. E11.69 08/31/22   Hoy Register, MD  ipratropium (ATROVENT) 0.03 % nasal spray Place 2 sprays into both nostrils 4 (four) times daily as needed for rhinitis. 12/16/22   Ferol Luz, MD  latanoprost (XALATAN) 0.005 % ophthalmic solution Place 1 drop into both eyes daily. 07/04/22   [provider]  methocarbamol (ROBAXIN) 500 MG tablet Take 1 tablet (500 mg total) by mouth every 8 (eight) hours as needed for muscle spasms. 05/17/23   Hoy Register, MD  methotrexate (RHEUMATREX) 2.5 MG tablet Take by mouth. 05/16/23   [provider]  Misc. Devices MISC Back brace. Diagnosis chronic back pain 06/16/20   Hoy Register, MD  Misc. Devices MISC Left knee brace. Diagnosis L knee pain 06/16/20   Hoy Register, MD  montelukast (SINGULAIR) 10 MG tablet Take 1 tablet (10 mg total) by mouth at bedtime. 11/29/22   Kozlow, Alvira Philips, MD  Olopatadine HCl (PATADAY) 0.2 % SOLN Place 1 drop into both eyes daily as needed. 11/29/22   Kozlow, Alvira Philips, MD  omeprazole  (PRILOSEC) 20 MG capsule TAKE 1 CAPSULE BY MOUTH EVERY DAY Patient taking differently: 20 mg every morning. TAKE 1 CAPSULE BY MOUTH EVERY DAY 06/23/22   Hoy Register, MD  oxyCODONE 10 MG TABS Take 1 tablet (10 mg total) by mouth every 4 (four) hours as needed for severe pain. 10/27/22   Donovan Kail, PA-C  valsartan (DIOVAN) 80 MG tablet Take 1 tablet (80 mg total) by mouth daily. 05/17/23   Hoy Register, MD  Vitamin D3 (VITAMIN  D) 25 MCG tablet Take 1,000 Units by mouth daily.    [provider]    Physical Exam    Vital Signs:  Jason Stokes does not have vital signs available for review today.  Given telephonic nature of communication, physical exam is limited. AAOx3. NAD. Normal affect.  Speech and respirations are unlabored.  Accessory Clinical Findings    None  Assessment & Plan    1.  Preoperative Cardiovascular Risk Assessment:  LEFT SHOULDER ARTHROPLASTY WITH RCR                           Surgeon:  DR. MARK YATES Surgeon's Group or Practice Name:  [facility] Phone number:  [phone] Fax number:  [phone]      Primary Cardiologist: Will Jorja Loa, MD  Chart reviewed as part of pre-operative protocol coverage. Given past medical history and time since last visit, based on ACC/AHA guidelines, Jason Stokes would be at acceptable risk for the planned procedure without further cardiovascular testing.   His RCRI is moderate risk, 6.6% risk of major cardiac event.  He is able to complete greater than 4 METS of physical activity.  Patient was advised that if he develops new symptoms prior to surgery to contact our office to arrange a follow-up appointment.  He verbalized understanding.  Per office protocol, patient can hold Eliquis for 3 days prior to procedure.   I will route this recommendation to the requesting party via Epic fax function and remove from pre-op pool.       Time:   Today, I have spent 5 minutes with the  patient with telehealth technology discussing medical history, symptoms, and management plan.     Ronney Asters, NP  06/23/2023, 10:32 AM    Prior to patient's phone evaluation I spent greater than 10 minutes reviewing their past medical history and cardiac medications.

## 2023-06-23 NOTE — Telephone Encounter (Signed)
Spoke with patient who is agreeable to do a tele visit on 12/6 at 3:20 per pre-op provider. Med rec and consent done

## 2023-06-23 NOTE — Telephone Encounter (Signed)
   Name: MONTELL WINGET  DOB: 1961-03-16  MRN: [id number]  Primary Cardiologist: Will Jorja Loa, MD   Preoperative team, please contact this patient and set up a phone call appointment for further preoperative risk assessment. Please obtain consent and complete medication review. Thank you for your help.  I confirm that guidance regarding antiplatelet and oral anticoagulation therapy has been completed and, if necessary, noted below.  Per office protocol, patient can hold Eliquis for 3 days prior to procedure.    Patient previously cleared by Dr. Elberta Fortis to hold 3 days.    I also confirmed the patient resides in the state of [place]. As per [facility] Medical Board telemedicine laws, the patient must reside in the state in which the provider is licensed.   Marcelino Duster, PA 06/23/2023, 9:14 AM  [facility]

## 2023-06-23 NOTE — Telephone Encounter (Signed)
  Patient Consent for Virtual Visit        Jason Stokes has provided verbal consent on 06/23/2023 for a virtual visit (video or telephone).   CONSENT FOR VIRTUAL VISIT FOR:  Jason Stokes  By participating in this virtual visit I agree to the following:  I hereby voluntarily request, consent and authorize [facility] and its employed or contracted physicians, physician assistants, nurse practitioners or other licensed health care professionals (the Practitioner), to provide me with telemedicine health care services (the "Services") as deemed necessary by the treating Practitioner. I acknowledge and consent to receive the Services by the Practitioner via telemedicine. I understand that the telemedicine visit will involve communicating with the Practitioner through live audiovisual communication technology and the disclosure of certain medical information by electronic transmission. I acknowledge that I have been given the opportunity to request an in-person assessment or other available alternative prior to the telemedicine visit and am voluntarily participating in the telemedicine visit.  I understand that I have the right to withhold or withdraw my consent to the use of telemedicine in the course of my care at any time, without affecting my right to future care or treatment, and that the Practitioner or I may terminate the telemedicine visit at any time. I understand that I have the right to inspect all information obtained and/or recorded in the course of the telemedicine visit and may receive copies of available information for a reasonable fee.  I understand that some of the potential risks of receiving the Services via telemedicine include:  Delay or interruption in medical evaluation due to technological equipment failure or disruption; Information transmitted may not be sufficient (e.g. poor resolution of images) to allow for appropriate medical decision making by the Practitioner;  and/or  In rare instances, security protocols could fail, causing a breach of personal health information.  Furthermore, I acknowledge that it is my responsibility to provide information about my medical history, conditions and care that is complete and accurate to the best of my ability. I acknowledge that Practitioner's advice, recommendations, and/or decision may be based on factors not within their control, such as incomplete or inaccurate data provided by me or distortions of diagnostic images or specimens that may result from electronic transmissions. I understand that the practice of medicine is not an exact science and that Practitioner makes no warranties or guarantees regarding treatment outcomes. I acknowledge that a copy of this consent can be made available to me via my patient portal [facility] MyChart), or I can request a printed copy by calling the office of [facility].    I understand that my insurance will be billed for this visit.   I have read or had this consent read to me. I understand the contents of this consent, which adequately explains the benefits and risks of the Services being provided via telemedicine.  I have been provided ample opportunity to ask questions regarding this consent and the Services and have had my questions answered to my satisfaction. I give my informed consent for the services to be provided through the use of telemedicine in my medical care

## 2023-06-26 ENCOUNTER — Ambulatory Visit (HOSPITAL_BASED_OUTPATIENT_CLINIC_OR_DEPARTMENT_OTHER): Admission: RE | Admit: 2023-06-26 | Payer: Medicaid Other | Source: Ambulatory Visit | Admitting: Orthopaedic Surgery

## 2023-06-26 ENCOUNTER — Telehealth: Payer: Self-pay

## 2023-06-26 ENCOUNTER — Encounter (HOSPITAL_BASED_OUTPATIENT_CLINIC_OR_DEPARTMENT_OTHER): Payer: Self-pay | Admitting: Certified Registered Nurse Anesthetist

## 2023-06-26 DIAGNOSIS — Z01818 Encounter for other preprocedural examination: Secondary | ICD-10-CM

## 2023-06-26 SURGERY — SHOULDER ARTHROSCOPY WITH ROTATOR CUFF REPAIR AND OPEN BICEPS TENODESIS
Anesthesia: General | Site: Shoulder | Laterality: Left

## 2023-06-26 NOTE — Telephone Encounter (Signed)
I called patient. He does not take tylenol and has listed it as an allergy. He requests something else. He also states if you send in Oxy, to send in the 10's because the 5's do not work and he has to double up on them and that is too many pills. I advised you did not normally give strong pain medication prior to surgery so that it works after. He says that the pain is worse and unbearable.  Please advise.

## 2023-06-26 NOTE — Addendum Note (Signed)
Addended by: Rogers Seeds on: 06/26/2023 01:35 PM   Modules accepted: Orders

## 2023-06-26 NOTE — Anesthesia Preprocedure Evaluation (Signed)
Anesthesia Evaluation  Patient identified by MRN, date of birth, ID band Patient awake    Reviewed: Allergy & Precautions, H&P , NPO status , Patient's Chart, lab work & pertinent test results, reviewed documented beta blocker date and time   Airway        Dental   Pulmonary asthma , Current Smoker and Patient abstained from smoking.          Cardiovascular hypertension, Pt. on medications and Pt. on home beta blockers + CAD (on CTA)  + dysrhythmias (eliquis LD:) Atrial Fibrillation   Echo 2022  1. Left ventricular ejection fraction, by estimation, is 60 to 65%. The  left ventricle has normal function. The left ventricle has no regional  wall motion abnormalities. There is moderate left ventricular hypertrophy.  Left ventricular diastolic  parameters are consistent with Grade I diastolic dysfunction (impaired  relaxation). Elevated left ventricular end-diastolic pressure. The E/e' is  16.   2. Right ventricular systolic function is normal. The right ventricular  size is normal.   3. The mitral valve is grossly normal. Trivial mitral valve  regurgitation.   4. The aortic valve is tricuspid. Aortic valve regurgitation is not  visualized.   5. Aortic dilatation noted. There is borderline dilatation of the aortic  root, measuring 38 mm. There is mild dilatation of the ascending aorta,  measuring 39 mm.   6. The inferior vena cava is normal in size with greater than 50%  respiratory variability, suggesting right atrial pressure of 3 mmHg.     Neuro/Psych  Headaches PSYCHIATRIC DISORDERS Anxiety Depression Bipolar Disorder Schizophrenia  CVA    GI/Hepatic ,GERD  Controlled and Medicated,,(+)     substance abuse  marijuana use  Endo/Other  diabetes  Obesity BMI 33  Renal/GU negative Renal ROS  negative genitourinary   Musculoskeletal  (+) Arthritis , Osteoarthritis,    Abdominal  (+) + obese  Peds negative pediatric  ROS (+)  Hematology negative hematology ROS (+)   Anesthesia Other Findings   Reproductive/Obstetrics negative OB ROS                              Anesthesia Physical Anesthesia Plan  ASA: 3  Anesthesia Plan: General and Regional   Post-op Pain Management: Regional block* and Ofirmev IV (intra-op)*   Induction: Intravenous  PONV Risk Score and Plan: 1 and Ondansetron, Dexamethasone, Midazolam and Treatment may vary due to age or medical condition  Airway Management Planned: Oral ETT  Additional Equipment: None  Intra-op Plan:   Post-operative Plan: Extubation in OR  Informed Consent:   Plan Discussed with:   Anesthesia Plan Comments:          Anesthesia Quick Evaluation

## 2023-06-26 NOTE — Telephone Encounter (Signed)
Shoulder surgery still pending insurance auth so surgery is being postponed.  Patient is requesting pain medication for the interim.

## 2023-06-27 ENCOUNTER — Other Ambulatory Visit: Payer: Self-pay | Admitting: Orthopaedic Surgery

## 2023-06-27 NOTE — Telephone Encounter (Signed)
noted 

## 2023-06-29 ENCOUNTER — Telehealth: Payer: Self-pay | Admitting: *Deleted

## 2023-06-29 NOTE — Telephone Encounter (Signed)
I spoke with Jason Stokes and reminded him to hold his apixaban beginning Saturday for his procedure on Tuesday with Dr Wynn Banker. Also reminded him to bring a driver that day. He agrees. ( See note by cardiology 06/23/23).

## 2023-06-30 ENCOUNTER — Telehealth: Payer: Self-pay

## 2023-06-30 DIAGNOSIS — L219 Seborrheic dermatitis, unspecified: Secondary | ICD-10-CM | POA: Diagnosis not present

## 2023-06-30 DIAGNOSIS — R238 Other skin changes: Secondary | ICD-10-CM

## 2023-06-30 DIAGNOSIS — L989 Disorder of the skin and subcutaneous tissue, unspecified: Secondary | ICD-10-CM | POA: Diagnosis not present

## 2023-06-30 NOTE — Telephone Encounter (Signed)
Copied from CRM [id number]. Topic: Referral - Request for Referral >> Jun 30, 2023 11:25 AM Everette C wrote: Did the patient discuss referral with their provider in the last year? Yes (If No - schedule appointment) (If Yes - send message)  Appointment offered? No  Type of order/referral and detailed reason for visit: Dermatology / skin irritation in the scalp   Preference of office, provider, location: Patient would like to be seen by someone associated with [facility]   If referral order, have you been seen by this specialty before? Yes (If Yes, this issue or another issue? When? Where?  Can we respond through MyChart? Yes

## 2023-06-30 NOTE — Telephone Encounter (Signed)
Routing to PCP for review.

## 2023-07-03 NOTE — Addendum Note (Signed)
Addended by: Hoy Register on: 07/03/2023 12:56 PM   Modules accepted: Orders

## 2023-07-04 ENCOUNTER — Encounter: Payer: Self-pay | Admitting: Physical Medicine & Rehabilitation

## 2023-07-04 ENCOUNTER — Encounter: Payer: Medicaid Other | Attending: Physical Medicine & Rehabilitation | Admitting: Physical Medicine & Rehabilitation

## 2023-07-04 VITALS — BP 136/89 | HR 96 | Ht 73.0 in | Wt 247.8 lb

## 2023-07-04 DIAGNOSIS — M5416 Radiculopathy, lumbar region: Secondary | ICD-10-CM | POA: Diagnosis not present

## 2023-07-04 MED ORDER — LIDOCAINE HCL (PF) 1 % IJ SOLN
4.0000 mL | Freq: Once | INTRAMUSCULAR | Status: AC
  Administered 2023-07-04: 4 mL

## 2023-07-04 MED ORDER — DEXAMETHASONE SODIUM PHOSPHATE 10 MG/ML IJ SOLN
20.0000 mg | Freq: Once | INTRAMUSCULAR | Status: AC
  Administered 2023-07-04: 20 mg

## 2023-07-04 MED ORDER — IOHEXOL 180 MG/ML  SOLN
3.0000 mL | Freq: Once | INTRAMUSCULAR | Status: AC
Start: 2023-07-04 — End: 2023-07-04
  Administered 2023-07-04: 3 mL

## 2023-07-04 MED ORDER — LIDOCAINE HCL 1 % IJ SOLN
5.0000 mL | Freq: Once | INTRAMUSCULAR | Status: AC
  Administered 2023-07-04: 5 mL

## 2023-07-04 NOTE — Progress Notes (Signed)
Bilateral L4-5 transforaminal lumbar ESI Lumbar transforaminal epidural steroid injection under fluoroscopic guidance with contrast enhancement  Indication: Lumbosacral radiculitis is not relieved by medication management or other conservative care and interfering with self-care and mobility.   Informed consent was obtained after describing risk and benefits of the procedure with the patient, this includes bleeding, bruising, infection, paralysis and medication side effects.  The patient wishes to proceed and has given written consent.  Patient was placed in prone position.  The lumbar area was marked and prepped with Betadine.  It was entered with a 25-gauge 1-1/2 inch needle and one mL of 1% lidocaine was injected into the skin and subcutaneous tissue.  Then a 22-gauge 5in spinal needle was inserted into the L4-5 intervertebral foramen under AP, lateral, and oblique view.  Once needle tip was within the foramen on lateral views an dnor exceeding 6 o clock position on th epedical on AP viewed Isovue 200 was inected x 2ml Then a solution containing one mL of 10 mg per mL dexamethasone and 2 mL of 1% lidocaine was injected.  The patient tolerated procedure well.  Post procedure instructions were given.  Please see post procedure form.

## 2023-07-04 NOTE — Progress Notes (Signed)
  PROCEDURE RECORD [facility]   Name: Jason Stokes DOB:[birth year] MRN: [id number]  Date:07/04/2023  Physician: Claudette Laws, MD    Nurse/CMA: Adline Kirshenbaum RN  Allergies:  Allergies  Allergen Reactions   Lisinopril Anaphylaxis    Swelling of lips and tongue.   Tylenol [Acetaminophen] Other (See Comments)    Acid reflux    Lyrica [Pregabalin] Palpitations    Consent Signed: Yes.    Is patient diabetic? Yes.    CBG today? Did not check  Pregnant: No. LMP: No LMP for male patient. (age 65-55)  Anticoagulants: yes (apixaban, stopped 07/02/23) Anti-inflammatory: no Antibiotics: no  Procedure: bil L4-5-S1 ESI  Position: Prone Start Time: 10:32  End Time: 10:41  Fluoro Time: 46 sec  RN/CMA Haematologist RN    Time 9:54 10:46    BP 136/89 136/86    Pulse 96 73    Respirations 14 14    O2 Sat 98 99    S/S 6 6    Pain Level 9/10 5/10     D/C home with [name], patient A & O X 3, D/C instructions reviewed, and sits independently.

## 2023-07-04 NOTE — Patient Instructions (Signed)
Resume Eliquis tomorrow am   You received an epidural steroid injection under fluoroscopic guidance. This is the most accurate way to perform an epidural injection. This injection was performed to relieve thigh or leg or foot pain that may be related to a pinched nerve in the lumbar spine. The local anesthetic injected today may cause numbness in your leg for a couple hours. If it is severe we may need to observe you for 30-60 minutes after the injection. The cortisone medicine injected today may take several days to take full effect. This medicine can also cause facial flushing or feeling of being warm.  This injection may last for days weeks or months. It can be repeated if needed. If it is not effective, another spinal level may need to be injected. Other treatments include medication management as well as physical therapy. In some cases surgery may be an option.

## 2023-07-05 NOTE — Telephone Encounter (Signed)
VM left informing patient that referral has been placed. 

## 2023-07-20 ENCOUNTER — Other Ambulatory Visit: Payer: Self-pay

## 2023-07-20 ENCOUNTER — Encounter (HOSPITAL_BASED_OUTPATIENT_CLINIC_OR_DEPARTMENT_OTHER): Payer: Self-pay | Admitting: Orthopaedic Surgery

## 2023-07-24 ENCOUNTER — Encounter (HOSPITAL_BASED_OUTPATIENT_CLINIC_OR_DEPARTMENT_OTHER)
Admission: RE | Admit: 2023-07-24 | Discharge: 2023-07-24 | Disposition: A | Discharge status: Home / Self Care | Payer: Medicaid Other | Source: Ambulatory Visit | Attending: Orthopaedic Surgery | Admitting: Orthopaedic Surgery

## 2023-07-24 ENCOUNTER — Other Ambulatory Visit: Payer: Self-pay | Admitting: Physician Assistant

## 2023-07-24 DIAGNOSIS — X58XXXA Exposure to other specified factors, initial encounter: Secondary | ICD-10-CM | POA: Diagnosis not present

## 2023-07-24 DIAGNOSIS — I119 Hypertensive heart disease without heart failure: Secondary | ICD-10-CM | POA: Diagnosis not present

## 2023-07-24 DIAGNOSIS — F17210 Nicotine dependence, cigarettes, uncomplicated: Secondary | ICD-10-CM | POA: Diagnosis not present

## 2023-07-24 DIAGNOSIS — E669 Obesity, unspecified: Secondary | ICD-10-CM | POA: Diagnosis not present

## 2023-07-24 DIAGNOSIS — J4520 Mild intermittent asthma, uncomplicated: Secondary | ICD-10-CM | POA: Diagnosis not present

## 2023-07-24 DIAGNOSIS — I480 Paroxysmal atrial fibrillation: Secondary | ICD-10-CM | POA: Diagnosis not present

## 2023-07-24 DIAGNOSIS — Z8673 Personal history of transient ischemic attack (TIA), and cerebral infarction without residual deficits: Secondary | ICD-10-CM | POA: Diagnosis not present

## 2023-07-24 DIAGNOSIS — M7522 Bicipital tendinitis, left shoulder: Secondary | ICD-10-CM | POA: Diagnosis not present

## 2023-07-24 DIAGNOSIS — Z6832 Body mass index (BMI) 32.0-32.9, adult: Secondary | ICD-10-CM | POA: Diagnosis not present

## 2023-07-24 DIAGNOSIS — M75122 Complete rotator cuff tear or rupture of left shoulder, not specified as traumatic: Secondary | ICD-10-CM | POA: Diagnosis present

## 2023-07-24 DIAGNOSIS — M19012 Primary osteoarthritis, left shoulder: Secondary | ICD-10-CM | POA: Diagnosis not present

## 2023-07-24 DIAGNOSIS — H30003 Unspecified focal chorioretinal inflammation, bilateral: Secondary | ICD-10-CM | POA: Diagnosis not present

## 2023-07-24 DIAGNOSIS — I2510 Atherosclerotic heart disease of native coronary artery without angina pectoris: Secondary | ICD-10-CM | POA: Diagnosis not present

## 2023-07-24 DIAGNOSIS — F319 Bipolar disorder, unspecified: Secondary | ICD-10-CM | POA: Diagnosis not present

## 2023-07-24 DIAGNOSIS — E1151 Type 2 diabetes mellitus with diabetic peripheral angiopathy without gangrene: Secondary | ICD-10-CM | POA: Diagnosis not present

## 2023-07-24 DIAGNOSIS — S43432A Superior glenoid labrum lesion of left shoulder, initial encounter: Secondary | ICD-10-CM | POA: Diagnosis not present

## 2023-07-24 LAB — BASIC METABOLIC PANEL
Anion gap: 8 (ref 5–15)
BUN: 8 mg/dL (ref 8–23)
CO2: 25 mmol/L (ref 22–32)
Calcium: 8.9 mg/dL (ref 8.9–10.3)
Chloride: 106 mmol/L (ref 98–111)
Creatinine, Ser: 1.11 mg/dL (ref 0.61–1.24)
GFR, Estimated: >60 mL/min (ref >60)
Glucose, Bld: 120 mg/dL — ABNORMAL HIGH (ref 70–99)
Potassium: 4.1 mmol/L (ref 3.5–5.1)
Sodium: 139 mmol/L (ref 135–145)

## 2023-07-24 NOTE — Progress Notes (Signed)
 Enhanced Recovery after Surgery  Enhanced Recovery after Surgery is a protocol used to improve the stress on your body and your recovery after surgery.  Patient Instructions  The night before surgery:  No food after midnight. ONLY clear liquids after midnight  The day of surgery (if you do NOT have diabetes):  Drink ONE (1) Pre-Surgery Clear Ensure as directed.   This drink was given to you during your hospital  pre-op appointment visit. The pre-op nurse will instruct you on the time to drink the  Pre-Surgery Ensure depending on your surgery time. Finish the drink at the designated time by the pre-op nurse.  Nothing else to drink after completing the  Pre-Surgery Clear Ensure.  The day of surgery (if you have diabetes): Drink ONE (1) Gatorade 2 (G2) as directed. This drink was given to you during your hospital  pre-op appointment visit.  The pre-op nurse will instruct you on the time to drink the   Gatorade 2 (G2) depending on your surgery time. Color of the Gatorade may vary. Red is not allowed. Nothing else to drink after completing the  Gatorade 2 (G2).         If office.you have questions, please contact your surgeon's officeSurgical soap given with instructions, pt verbalized understanding.

## 2023-07-25 DIAGNOSIS — H30033 Focal chorioretinal inflammation, peripheral, bilateral: Secondary | ICD-10-CM | POA: Diagnosis not present

## 2023-07-25 DIAGNOSIS — H3581 Retinal edema: Secondary | ICD-10-CM | POA: Diagnosis not present

## 2023-07-26 ENCOUNTER — Ambulatory Visit (HOSPITAL_BASED_OUTPATIENT_CLINIC_OR_DEPARTMENT_OTHER): Payer: Medicaid Other | Admitting: Anesthesiology

## 2023-07-26 ENCOUNTER — Ambulatory Visit (HOSPITAL_BASED_OUTPATIENT_CLINIC_OR_DEPARTMENT_OTHER)
Admission: RE | Admit: 2023-07-26 | Discharge: 2023-07-26 | Disposition: A | Discharge status: Home / Self Care | Payer: Medicaid Other | Source: Ambulatory Visit | Attending: Orthopaedic Surgery | Admitting: Orthopaedic Surgery

## 2023-07-26 ENCOUNTER — Encounter (HOSPITAL_BASED_OUTPATIENT_CLINIC_OR_DEPARTMENT_OTHER): Payer: Self-pay | Admitting: Orthopaedic Surgery

## 2023-07-26 ENCOUNTER — Other Ambulatory Visit: Payer: Self-pay

## 2023-07-26 ENCOUNTER — Encounter (HOSPITAL_BASED_OUTPATIENT_CLINIC_OR_DEPARTMENT_OTHER)
Admission: RE | Disposition: A | Discharge status: Home / Self Care | Payer: Self-pay | Source: Ambulatory Visit | Attending: Orthopaedic Surgery

## 2023-07-26 ENCOUNTER — Other Ambulatory Visit: Payer: Self-pay | Admitting: Cardiology

## 2023-07-26 DIAGNOSIS — M75102 Unspecified rotator cuff tear or rupture of left shoulder, not specified as traumatic: Secondary | ICD-10-CM

## 2023-07-26 DIAGNOSIS — X58XXXA Exposure to other specified factors, initial encounter: Secondary | ICD-10-CM | POA: Insufficient documentation

## 2023-07-26 DIAGNOSIS — M75122 Complete rotator cuff tear or rupture of left shoulder, not specified as traumatic: Secondary | ICD-10-CM | POA: Insufficient documentation

## 2023-07-26 DIAGNOSIS — I480 Paroxysmal atrial fibrillation: Secondary | ICD-10-CM | POA: Diagnosis not present

## 2023-07-26 DIAGNOSIS — Z6832 Body mass index (BMI) 32.0-32.9, adult: Secondary | ICD-10-CM | POA: Insufficient documentation

## 2023-07-26 DIAGNOSIS — I119 Hypertensive heart disease without heart failure: Secondary | ICD-10-CM | POA: Insufficient documentation

## 2023-07-26 DIAGNOSIS — E669 Obesity, unspecified: Secondary | ICD-10-CM | POA: Insufficient documentation

## 2023-07-26 DIAGNOSIS — M7522 Bicipital tendinitis, left shoulder: Secondary | ICD-10-CM | POA: Diagnosis not present

## 2023-07-26 DIAGNOSIS — H30003 Unspecified focal chorioretinal inflammation, bilateral: Secondary | ICD-10-CM | POA: Insufficient documentation

## 2023-07-26 DIAGNOSIS — S43432A Superior glenoid labrum lesion of left shoulder, initial encounter: Secondary | ICD-10-CM | POA: Insufficient documentation

## 2023-07-26 DIAGNOSIS — I2510 Atherosclerotic heart disease of native coronary artery without angina pectoris: Secondary | ICD-10-CM | POA: Diagnosis not present

## 2023-07-26 DIAGNOSIS — G8918 Other acute postprocedural pain: Secondary | ICD-10-CM | POA: Diagnosis not present

## 2023-07-26 DIAGNOSIS — I10 Essential (primary) hypertension: Secondary | ICD-10-CM | POA: Diagnosis not present

## 2023-07-26 DIAGNOSIS — M19012 Primary osteoarthritis, left shoulder: Secondary | ICD-10-CM | POA: Insufficient documentation

## 2023-07-26 DIAGNOSIS — J4520 Mild intermittent asthma, uncomplicated: Secondary | ICD-10-CM | POA: Insufficient documentation

## 2023-07-26 DIAGNOSIS — F17210 Nicotine dependence, cigarettes, uncomplicated: Secondary | ICD-10-CM | POA: Insufficient documentation

## 2023-07-26 DIAGNOSIS — M75121 Complete rotator cuff tear or rupture of right shoulder, not specified as traumatic: Secondary | ICD-10-CM | POA: Diagnosis present

## 2023-07-26 DIAGNOSIS — E1151 Type 2 diabetes mellitus with diabetic peripheral angiopathy without gangrene: Secondary | ICD-10-CM | POA: Insufficient documentation

## 2023-07-26 DIAGNOSIS — F319 Bipolar disorder, unspecified: Secondary | ICD-10-CM | POA: Insufficient documentation

## 2023-07-26 DIAGNOSIS — E1165 Type 2 diabetes mellitus with hyperglycemia: Secondary | ICD-10-CM | POA: Diagnosis not present

## 2023-07-26 DIAGNOSIS — Z8673 Personal history of transient ischemic attack (TIA), and cerebral infarction without residual deficits: Secondary | ICD-10-CM | POA: Insufficient documentation

## 2023-07-26 HISTORY — PX: SHOULDER ARTHROSCOPY WITH ROTATOR CUFF REPAIR AND OPEN BICEPS TENODESIS: SHX6677

## 2023-07-26 LAB — GLUCOSE, CAPILLARY
Glucose-Capillary: 91 mg/dL (ref 70–99)
Glucose-Capillary: 95 mg/dL (ref 70–99)

## 2023-07-26 SURGERY — SHOULDER ARTHROSCOPY WITH ROTATOR CUFF REPAIR AND OPEN BICEPS TENODESIS
Anesthesia: General | Site: Shoulder | Laterality: Left

## 2023-07-26 MED ORDER — DEXAMETHASONE SODIUM PHOSPHATE 4 MG/ML IJ SOLN
INTRAMUSCULAR | Status: DC | PRN
  Administered 2023-07-26: 5 mg via INTRAVENOUS

## 2023-07-26 MED ORDER — LACTATED RINGERS IV SOLN: INTRAVENOUS | Status: DC

## 2023-07-26 MED ORDER — ROCURONIUM BROMIDE 10 MG/ML (PF) SYRINGE
PREFILLED_SYRINGE | INTRAVENOUS | Status: AC
Start: 2023-07-26 — End: ?
  Filled 2023-07-26: qty 10

## 2023-07-26 MED ORDER — ONDANSETRON HCL 4 MG/2ML IJ SOLN: 4.0000 mg | Freq: Once | INTRAMUSCULAR | Status: DC | PRN

## 2023-07-26 MED ORDER — CEFAZOLIN SODIUM-DEXTROSE 2-4 GM/100ML-% IV SOLN
2.0000 g | INTRAVENOUS | Status: AC
  Administered 2023-07-26: 2 g via INTRAVENOUS

## 2023-07-26 MED ORDER — SUGAMMADEX SODIUM 200 MG/2ML IV SOLN
INTRAVENOUS | Status: DC | PRN
  Administered 2023-07-26: 200 mg via INTRAVENOUS

## 2023-07-26 MED ORDER — SODIUM CHLORIDE 0.9 % IR SOLN
Status: DC | PRN
  Administered 2023-07-26: 3000 mL

## 2023-07-26 MED ORDER — CEFAZOLIN SODIUM-DEXTROSE 2-4 GM/100ML-% IV SOLN
INTRAVENOUS | Status: AC
  Filled 2023-07-26: qty 100

## 2023-07-26 MED ORDER — ONDANSETRON HCL 4 MG/2ML IJ SOLN
INTRAMUSCULAR | Status: AC
Start: 2023-07-26 — End: ?
  Filled 2023-07-26: qty 2

## 2023-07-26 MED ORDER — PHENYLEPHRINE HCL-NACL 20-0.9 MG/250ML-% IV SOLN
INTRAVENOUS | Status: DC | PRN
  Administered 2023-07-26: 35 ug/min via INTRAVENOUS

## 2023-07-26 MED ORDER — HYDROMORPHONE HCL 1 MG/ML IJ SOLN: 0.2500 mg | INTRAMUSCULAR | Status: DC | PRN

## 2023-07-26 MED ORDER — FENTANYL CITRATE (PF) 100 MCG/2ML IJ SOLN
100.0000 ug | Freq: Once | INTRAMUSCULAR | Status: AC
  Administered 2023-07-26: 50 ug via INTRAVENOUS

## 2023-07-26 MED ORDER — OXYCODONE-ACETAMINOPHEN 5-325 MG PO TABS: 1.0000 | ORAL_TABLET | Freq: Four times a day (QID) | ORAL | 0 refills | Status: DC | PRN

## 2023-07-26 MED ORDER — OXYCODONE HCL 5 MG PO TABS: 5.0000 mg | ORAL_TABLET | Freq: Once | ORAL | Status: DC | PRN

## 2023-07-26 MED ORDER — FENTANYL CITRATE (PF) 100 MCG/2ML IJ SOLN
INTRAMUSCULAR | Status: AC
  Filled 2023-07-26: qty 2

## 2023-07-26 MED ORDER — MIDAZOLAM HCL 2 MG/2ML IJ SOLN: 2.0000 mg | Freq: Once | INTRAMUSCULAR | Status: DC

## 2023-07-26 MED ORDER — FENTANYL CITRATE (PF) 100 MCG/2ML IJ SOLN
INTRAMUSCULAR | Status: DC | PRN
  Administered 2023-07-26: 100 ug via INTRAVENOUS

## 2023-07-26 MED ORDER — BUPIVACAINE LIPOSOME 1.3 % IJ SUSP
INTRAMUSCULAR | Status: DC | PRN
  Administered 2023-07-26: 10 mL via PERINEURAL

## 2023-07-26 MED ORDER — DEXAMETHASONE SODIUM PHOSPHATE 10 MG/ML IJ SOLN
INTRAMUSCULAR | Status: AC
Start: 2023-07-26 — End: ?
  Filled 2023-07-26: qty 1

## 2023-07-26 MED ORDER — LIDOCAINE 2% (20 MG/ML) 5 ML SYRINGE
INTRAMUSCULAR | Status: AC
  Filled 2023-07-26: qty 5

## 2023-07-26 MED ORDER — DEXAMETHASONE SODIUM PHOSPHATE 10 MG/ML IJ SOLN
INTRAMUSCULAR | Status: AC
  Filled 2023-07-26: qty 1

## 2023-07-26 MED ORDER — ROCURONIUM 10MG/ML (10ML) SYRINGE FOR MEDFUSION PUMP - OPTIME
INTRAVENOUS | Status: DC | PRN
  Administered 2023-07-26: 80 mg via INTRAVENOUS

## 2023-07-26 MED ORDER — PROPOFOL 10 MG/ML IV BOLUS
INTRAVENOUS | Status: DC | PRN
  Administered 2023-07-26: 200 mg via INTRAVENOUS

## 2023-07-26 MED ORDER — BUPIVACAINE HCL (PF) 0.5 % IJ SOLN
INTRAMUSCULAR | Status: DC | PRN
  Administered 2023-07-26: 20 mL via PERINEURAL

## 2023-07-26 MED ORDER — DROPERIDOL 2.5 MG/ML IJ SOLN: 0.6250 mg | Freq: Once | INTRAMUSCULAR | Status: DC | PRN

## 2023-07-26 MED ORDER — SODIUM CHLORIDE 0.9 % IV SOLN: INTRAVENOUS | Status: DC | PRN

## 2023-07-26 MED ORDER — PROPOFOL 10 MG/ML IV BOLUS
INTRAVENOUS | Status: AC
Start: 2023-07-26 — End: ?
  Filled 2023-07-26: qty 20

## 2023-07-26 MED ORDER — MIDAZOLAM HCL 2 MG/2ML IJ SOLN
2.0000 mg | Freq: Once | INTRAMUSCULAR | Status: AC
  Administered 2023-07-26: 1 mg via INTRAVENOUS

## 2023-07-26 MED ORDER — ONDANSETRON HCL 4 MG/2ML IJ SOLN
INTRAMUSCULAR | Status: AC
  Filled 2023-07-26: qty 2

## 2023-07-26 MED ORDER — BUPIVACAINE-EPINEPHRINE 0.25% -1:200000 IJ SOLN
INTRAMUSCULAR | Status: DC | PRN
  Administered 2023-07-26: 6 mL

## 2023-07-26 MED ORDER — BUPIVACAINE-EPINEPHRINE (PF) 0.25% -1:200000 IJ SOLN
INTRAMUSCULAR | Status: AC
  Filled 2023-07-26: qty 30

## 2023-07-26 MED ORDER — FENTANYL CITRATE (PF) 100 MCG/2ML IJ SOLN: 100.0000 ug | Freq: Once | INTRAMUSCULAR | Status: DC

## 2023-07-26 MED ORDER — ONDANSETRON HCL 4 MG/2ML IJ SOLN
INTRAMUSCULAR | Status: DC | PRN
  Administered 2023-07-26: 4 mg via INTRAVENOUS

## 2023-07-26 MED ORDER — BUPIVACAINE LIPOSOME 1.3 % IJ SUSP
INTRAMUSCULAR | Status: AC
  Filled 2023-07-26: qty 10

## 2023-07-26 MED ORDER — OXYCODONE HCL 5 MG/5ML PO SOLN: 5.0000 mg | Freq: Once | ORAL | Status: DC | PRN

## 2023-07-26 MED ORDER — LIDOCAINE 2% (20 MG/ML) 5 ML SYRINGE
INTRAMUSCULAR | Status: DC | PRN
  Administered 2023-07-26: 80 mg via INTRAVENOUS

## 2023-07-26 MED ORDER — MIDAZOLAM HCL 2 MG/2ML IJ SOLN
INTRAMUSCULAR | Status: AC
  Filled 2023-07-26: qty 2

## 2023-07-26 SURGICAL SUPPLY — 72 items
ANCHOR SUT BIO SW 4.75X19.1 (Anchor) IMPLANT
ANCHOR SUT SWIVELLOK BIO (Anchor) IMPLANT
ANCHOR SUT SWIVELLOK BIO 7X1 (Anchor) IMPLANT
BENZOIN TINCTURE PRP APPL 2/3 (GAUZE/BANDAGES/DRESSINGS) IMPLANT
BLADE SURG 15 STRL LF DISP TIS (BLADE) IMPLANT
BURR OVAL 8 FLU 5.0X13 (MISCELLANEOUS) IMPLANT
CANNULA ACUFLEX KIT 5X76 (CANNULA) ×1 IMPLANT
CLEANER CAUTERY TIP PAD (MISCELLANEOUS) IMPLANT
DISSECTOR 3.8MM X 13CM (MISCELLANEOUS) ×1 IMPLANT
DISSECTOR 4.0MM X 13CM (MISCELLANEOUS) IMPLANT
DRAPE STERI 35X30 U-POUCH (DRAPES) ×1 IMPLANT
DRAPE U-SHAPE 47X51 STRL (DRAPES) ×1 IMPLANT
DRAPE U-SHAPE 76X120 STRL (DRAPES) ×2 IMPLANT
DRSG EMULSION OIL 3X3 NADH (GAUZE/BANDAGES/DRESSINGS) ×1 IMPLANT
DURAPREP 26ML APPLICATOR (WOUND CARE) ×1 IMPLANT
ELECT NDL TIP 2.8 STRL (NEEDLE) IMPLANT
ELECT NEEDLE TIP 2.8 STRL (NEEDLE) ×1
ELECT REM PT RETURN 9FT ADLT (ELECTROSURGICAL) ×1
ELECTRODE REM PT RTRN 9FT ADLT (ELECTROSURGICAL) ×1 IMPLANT
EXCALIBUR 3.8MM X 13CM (MISCELLANEOUS) IMPLANT
EXCALIBUR CVD 4.0MM X 13CM (MISCELLANEOUS) IMPLANT
GAUZE 4X4 16PLY ~~LOC~~+RFID DBL (SPONGE) IMPLANT
GAUZE PAD ABD 8X10 STRL (GAUZE/BANDAGES/DRESSINGS) ×1 IMPLANT
GAUZE SPONGE 4X4 12PLY STRL (GAUZE/BANDAGES/DRESSINGS) IMPLANT
GLOVE BIO SURGEON STRL SZ7 (GLOVE) IMPLANT
GLOVE BIO SURGEON STRL SZ7.5 (GLOVE) ×1 IMPLANT
GLOVE BIOGEL PI IND STRL 7.0 (GLOVE) IMPLANT
GLOVE BIOGEL PI IND STRL 7.5 (GLOVE) IMPLANT
GLOVE BIOGEL PI IND STRL 8 (GLOVE) ×1 IMPLANT
GOWN STRL REUS W/ TWL LRG LVL3 (GOWN DISPOSABLE) ×1 IMPLANT
GOWN STRL REUS W/ TWL XL LVL3 (GOWN DISPOSABLE) ×1 IMPLANT
KIT INSRT BABSR STRL DISP BTN (MISCELLANEOUS) IMPLANT
LASSO CRESCENT QUICKPASS (SUTURE) IMPLANT
MANIFOLD NEPTUNE II (INSTRUMENTS) ×1 IMPLANT
NDL MAYO CATGUT SZ4 TPR NDL (NEEDLE) IMPLANT
NDL SAFETY ECLIPSE 18X1.5 (NEEDLE) IMPLANT
NDL SUT 6 .5 CRC .975X.05 MAYO (NEEDLE) IMPLANT
NEEDLE MAYO CATGUT SZ4 (NEEDLE) ×1
PACK ARTHROSCOPY DSU (CUSTOM PROCEDURE TRAY) ×1 IMPLANT
PACK BASIN DAY SURGERY FS (CUSTOM PROCEDURE TRAY) ×1 IMPLANT
PENCIL SMOKE EVACUATOR (MISCELLANEOUS) ×1 IMPLANT
RESTRAINT HEAD UNIVERSAL NS (MISCELLANEOUS) ×1 IMPLANT
SLEEVE SCD COMPRESS KNEE MED (STOCKING) ×1 IMPLANT
SLING ARM FOAM STRAP LRG (SOFTGOODS) IMPLANT
SLING ARM IMMOBILIZER XL (CAST SUPPLIES) IMPLANT
SPIKE FLUID TRANSFER (MISCELLANEOUS) IMPLANT
SPONGE SURGIFOAM ABS GEL 12-7 (HEMOSTASIS) IMPLANT
STAPLER SKIN PROX WIDE 3.9 (STAPLE) IMPLANT
STRIP CLOSURE SKIN 1/2X4 (GAUZE/BANDAGES/DRESSINGS) IMPLANT
SUCTION TUBE FRAZIER 10FR DISP (SUCTIONS) IMPLANT
SUT 2 FIBERLOOP 20 STRT BLUE (SUTURE) ×1
SUT BONE WAX W31G (SUTURE) IMPLANT
SUT ETHIBOND 2 OS 4 DA (SUTURE) IMPLANT
SUT ETHILON 3 0 PS 1 (SUTURE) IMPLANT
SUT ETHILON 4 0 PS 2 18 (SUTURE) ×1 IMPLANT
SUT MNCRL AB 3-0 PS2 18 (SUTURE) IMPLANT
SUT PDS AB 0 CT 36 (SUTURE) IMPLANT
SUT TICRON 1 T 12 (SUTURE) IMPLANT
SUT VIC AB 0 CT1 27XBRD ANBCTR (SUTURE) IMPLANT
SUT VIC AB 1 CT1 27XBRD ANBCTR (SUTURE) IMPLANT
SUT VIC AB 2-0 SH 27XBRD (SUTURE) IMPLANT
SUT VIC AB 3-0 FS2 27 (SUTURE) IMPLANT
SUT VIC AB 4-0 PS2 18 (SUTURE) IMPLANT
SUTURE 2 FIBERLOOP 20 STRT BLU (SUTURE) IMPLANT
SYR BULB EAR ULCER 3OZ GRN STR (SYRINGE) IMPLANT
TAPE CLOTH SURG 6X10 NS LF (GAUZE/BANDAGES/DRESSINGS) ×1 IMPLANT
TOWEL GREEN STERILE FF (TOWEL DISPOSABLE) ×1 IMPLANT
TUBE CONNECTING 20X1/4 (TUBING) IMPLANT
TUBING ARTHROSCOPY IRRIG 16FT (MISCELLANEOUS) ×1 IMPLANT
WAND ABLATOR APOLLO I90 (BUR) IMPLANT
WATER STERILE IRR 1000ML POUR (IV SOLUTION) ×1 IMPLANT
YANKAUER SUCT BULB TIP NO VENT (SUCTIONS) IMPLANT

## 2023-07-26 NOTE — Anesthesia Preprocedure Evaluation (Addendum)
 Anesthesia Evaluation  Patient identified by MRN, date of birth, ID band Patient awake    Reviewed: Allergy & Precautions, NPO status , Patient's Chart, lab work & pertinent test results, reviewed documented beta blocker date and time   Airway Mallampati: II  TM Distance: >3 FB     Dental  (+) Missing, Dental Advisory Given   Pulmonary asthma , Current Smoker and Patient abstained from smoking.   Pulmonary exam normal breath sounds clear to auscultation       Cardiovascular hypertension, Pt. on medications and Pt. on home beta blockers + CAD and + Peripheral Vascular Disease  + dysrhythmias Atrial Fibrillation  Rhythm:Regular Rate:Normal  EKG 03/23/23 NSR, Normal  EP/Ablation 04/28/22 1. Sinus rhythm upon presentation.   2. Successful electrical isolation and anatomical encircling of all four pulmonary veins with radiofrequency current.  A WACA approach was used 3. Additional left atrial ablation was performed with a standard box lesion created along the posterior wall of the left atrium 4. No early apparent complications.   Echo 01/13/21 1. Left ventricular ejection fraction, by estimation, is 60 to 65%. The  left ventricle has normal function. The left ventricle has no regional  wall motion abnormalities. There is moderate left ventricular hypertrophy.  Left ventricular diastolic  parameters are consistent with Grade I diastolic dysfunction (impaired  relaxation). Elevated left ventricular end-diastolic pressure. The E/e' is  16.   2. Right ventricular systolic function is normal. The right ventricular  size is normal.   3. The mitral valve is grossly normal. Trivial mitral valve  regurgitation.   4. The aortic valve is tricuspid. Aortic valve regurgitation is not  visualized.   5. Aortic dilatation noted. There is borderline dilatation of the aortic  root, measuring 38 mm. There is mild dilatation of the ascending aorta,   measuring 39 mm.   6. The inferior vena cava is normal in size with greater than 50%  respiratory variability, suggesting right atrial pressure of 3 mmHg.   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   Neuro/Psych  Headaches PSYCHIATRIC DISORDERS Anxiety Depression Bipolar Disorder Schizophrenia  on azothioprine for peripheral focal chorioretinal inflammation (both eyes) Glaucoma  Neuromuscular disease CVA, No Residual Symptoms    GI/Hepatic ,GERD  Medicated,,(+)     substance abuse  marijuana use  Endo/Other  diabetes, Well Controlled, Type 2, Oral Hypoglycemic Agents  Obesity HLD  Renal/GU negative Renal ROS  negative genitourinary   Musculoskeletal  (+) Arthritis , Osteoarthritis,  Left shoulder rotator cuff tear Biceps tendinopathy Chronic LBP   Abdominal  (+) + obese  Peds  Hematology  (+) Blood dyscrasia, anemia Eliquis  therapy- last dose 07/22/23   Anesthesia Other Findings   Reproductive/Obstetrics                             Anesthesia Physical Anesthesia Plan  ASA: 3  Anesthesia Plan: General   Post-op Pain Management: Regional block*, Minimal or no pain anticipated, Tylenol  PO (pre-op )* and Precedex    Induction: Intravenous  PONV Risk Score and Plan: 2 and Treatment may vary due to age or medical condition, Midazolam , Dexamethasone  and Ondansetron   Airway Management Planned: Oral ETT  Additional Equipment: None  Intra-op Plan:   Post-operative Plan: Extubation in OR  Informed Consent: I have reviewed the patients History and Physical, chart, labs and discussed the procedure including the risks, benefits and alternatives for the proposed anesthesia with the patient or authorized representative  who has indicated his/her understanding and acceptance.     Dental advisory given  Plan Discussed with: CRNA and Anesthesiologist  Anesthesia Plan Comments:         Anesthesia Quick Evaluation

## 2023-07-26 NOTE — Anesthesia Procedure Notes (Signed)
 Procedure Name: Intubation Date/Time: 07/26/2023 1:32 PM  Performed by: Burnard Rosaline HERO, CRNAPre-anesthesia Checklist: Patient identified, Emergency Drugs available, Suction available and Patient being monitored Patient Re-evaluated:Patient Re-evaluated prior to induction Oxygen Delivery Method: Circle system utilized Preoxygenation: Pre-oxygenation with 100% oxygen Induction Type: IV induction Ventilation: Mask ventilation without difficulty Laryngoscope Size: Mac and 4 Grade View: Grade I Tube type: Oral Tube size: 7.0 mm Number of attempts: 1 Airway Equipment and Method: Stylet and Oral airway Placement Confirmation: ETT inserted through vocal cords under direct vision, positive ETCO2, breath sounds checked- equal and bilateral and CO2 detector Secured at: 23 cm Tube secured with: Tape Dental Injury: Teeth and Oropharynx as per pre-operative assessment

## 2023-07-26 NOTE — Anesthesia Procedure Notes (Signed)
 Anesthesia Regional Block: Interscalene brachial plexus block   Pre-Anesthetic Checklist: , timeout performed,  Correct Patient, Correct Site, Correct Laterality,  Correct Procedure, Correct Position, site marked,  Risks and benefits discussed,  Surgical consent,  Pre-op  evaluation,  At surgeon's request and post-op pain management  Laterality: Left  Prep: chloraprep       Needles:  Injection technique: Single-shot  Needle Type: Echogenic Stimulator Needle     Needle Length: 10cm  Needle Gauge: 21   Needle insertion depth: 6 cm   Additional Needles:   Procedures:,,,, ultrasound used (permanent image in chart),,   Motor weakness within 5 minutes.  Narrative:  Start time: 07/26/2023 11:32 AM End time: 07/26/2023 11:37 AM Injection made incrementally with aspirations every 5 mL.  Performed by: Personally  Anesthesiologist: Jerrye Sharper, MD  Additional Notes: Timeout performed. Patient sedated. Relevant anatomy ID'd using US . Incremental 2-5ml injection of LA with frequent aspiration. Patient tolerated procedure well.

## 2023-07-26 NOTE — H&P (Signed)
 Assessment & Plan: Visit Diagnoses:  1. Complete tear of left rotator cuff, unspecified whether traumatic   2. Tendinitis of upper biceps tendon of left shoulder       Plan: Patient is off all medications for blood pressure we will continue them compliance again reinforced and discussed.  Will proceed with the outpatient shoulder arthroscopy in the left shoulder with plan for biceps tenodesis and rotator cuff repair.  Procedure discussed risk discussed questions elicited and answered.  Plan would be interscalene block with general anesthesia LMA and local at the portals.  Patient understands request to proceed.   Follow-Up Instructions: No follow-ups on file.    Orders:  No orders of the defined types were placed in this encounter.   No orders of the defined types were placed in this encounter.        Procedures: No procedures performed     Clinical Data: No additional findings.     Subjective:    Chief Complaint  Patient presents with   Right Shoulder - Pain   Left Shoulder - Pain      HPI 63 year old male returns with bilateral shoulder pain both right and  left.  We discussed surgery on his left shoulder for subluxed biceps tendon and rotator cuff tear with plan shoulder arthroscopy rotator cuff repair and biceps tenodesis.  His blood pressure was uncontrolled at 173/123 now much better 145/95.  He is on 4 medications he states his blood pressure goes up when he is in the medical office.  Having pain with abducted positions of his arm outstretched reaching having trouble sleeping and states he wants to proceed with surgery.  Past history of a CVA which no weakness.  Positive tobacco history as well as GERD.   Review of Systems previous CVA with no residual 2016.  Possible bipolar disorder atrial fibs in the distant past.     Objective: Vital Signs: BP (!) 145/97   Pulse 91   Ht 6' 1 (1.854 m)   Wt 243 lb (110.2 kg)   BMI 32.06 kg/m    Physical  Exam Constitutional:      Appearance: He is well-developed.  HENT:     Head: Normocephalic and atraumatic.     Right Ear: External ear normal.     Left Ear: External ear normal.  Eyes:     Pupils: Pupils are equal, round, and reactive to light.  Neck:     Thyroid : No thyromegaly.     Trachea: No tracheal deviation.  Cardiovascular:     Rate and Rhythm: Normal rate.  Pulmonary:     Effort: Pulmonary effort is normal.     Breath sounds: No wheezing.  Abdominal:     General: Bowel sounds are normal.     Palpations: Abdomen is soft.  Musculoskeletal:     Cervical back: Neck supple.  Skin:    General: Skin is warm and dry.     Capillary Refill: Capillary refill takes less than 2 seconds.  Neurological:     Mental Status: He is alert and oriented to person, place, and time.  Psychiatric:        Behavior: Behavior normal.        Thought Content: Thought content normal.        Judgment: Judgment normal.        Ortho Exam bilateral shoulder impingement right greater than left positive drop arm test right arm.  Positive Yergason right upper extremity reflexes are 2+ and symmetrical.  Specialty Comments:  CLINICAL DATA:  Chronic neck pain   EXAM: CT CERVICAL SPINE WITHOUT CONTRAST   TECHNIQUE: Multidetector CT imaging of the cervical spine was performed without intravenous contrast. Multiplanar CT image reconstructions were also generated.   RADIATION DOSE REDUCTION: This exam was performed according to the departmental dose-optimization program which includes automated exposure control, adjustment of the mA and/or kV according to patient size and/or use of iterative reconstruction technique.   COMPARISON:  07/16/2022, 05/15/2022   FINDINGS: Alignment: Facet joints are aligned without dislocation or traumatic listhesis. Dens and lateral masses are aligned.   Skull base and vertebrae: No acute fracture. No primary bone lesion or focal pathologic process.   Soft  tissues and spinal canal: No prevertebral fluid or swelling. No visible canal hematoma.   Disc levels: Degenerative disc disease most pronounced at the C3-4 and C5-6 levels, without interval progression from recent previous CT and MRI.   Upper chest: Negative.   Other: None.   IMPRESSION: 1. No acute fracture or traumatic listhesis of the cervical spine. 2. 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     Electronically Signed   By: Mabel Converse D.O.   On: 07/27/2022 14:03   Imaging: CLINICAL DATA:  Left shoulder pain radiating down the arm.   EXAM: MRI OF THE LEFT SHOULDER WITHOUT CONTRAST   TECHNIQUE: Multiplanar, multisequence MR imaging of the shoulder was performed. No intravenous contrast was administered.   COMPARISON:  Left arm radiographs 04/27/2022   FINDINGS: Rotator cuff: There is a full-thickness tear of the mid to anterior supraspinatus tendon footprint measuring up to 14 mm in AP dimension (sagittal image 24) and with up to 10 mm tendon retraction (coronal image 13). There is trace fluid at the deep aspect of the proximal infraspinatus musculotendinous junction (sagittal series 6, image 13), a tiny interstitial tear. There is also a tiny articular sided tear of the distal critical zone of the mid to anterior infraspinatus, measuring only 3 mm in AP dimension, 2 mm along the longitudinal dimension of the tendon, and 3 mm in craniocaudal thickness (sagittal series 6, image 20 and coronal series 4, image 6). There is a partial-thickness tear of the midsubstance fibers of the superior subscapularis tendon in a region measuring up to 9 mm in craniocaudal dimension. The teres minor is intact.   Muscles:  Mild to moderate anterior supraspinatus muscle atrophy.   Biceps long head: Mild to moderate intermediate T2 signal and thickening tendinosis of the long head of  the biceps tendon proximal to the bicipital groove. The superior subscapularis tendon tear allows the long head of the biceps tendon to be partially perched on the anterior superior aspect of the lesser tuberosity as the biceps tendon enters the bicipital groove (axial series 8, image 12).   Acromioclavicular Joint: There are moderate to severe degenerative changes of the acromioclavicular joint including joint space narrowing, subchondral marrow edema, and peripheral osteophytosis. Type III acromion, with mild downsloping of the anterolateral acromion. Mild-to-moderate fluid within the subacromial/subdeltoid bursa.   Glenohumeral Joint: Moderate glenohumeral cartilage thinning. Mild inferior glenoid and humeral head-neck junction degenerative osteophytes.   Labrum: Mild degenerative irregularity of the posteroinferior glenoid labrum.   Bones:  No acute fracture.   Other: None.   IMPRESSION: 1. Full-thickness tear of the mid to anterior supraspinatus tendon footprint measuring up to 14 mm in AP dimension. 2. Tiny, shallow articular sided tear of  the distal critical zone of the mid to anterior infraspinatus tendon measuring only 3 mm in AP dimension. 3. Partial-thickness tear of the midsubstance fibers of the superior subscapularis tendon in a region measuring up to 9 mm in craniocaudal dimension. 4. Mild-to-moderate long head of the biceps tendinosis proximal to the bicipital groove. The superior subscapularis tendon tear allows the long head of the biceps tendon to be partially perched on the anterior superior aspect of the lesser tuberosity as the biceps tendon enters the bicipital groove. 5. Moderate to severe degenerative changes of the acromioclavicular joint. Type III acromion.     Electronically Signed   By: Tanda Lyons M.D.   On: 10/25/2022 13:26     PMFS History:     Patient Active Problem List    Diagnosis Date Noted   Tendinitis of upper biceps tendon  of left shoulder 06/12/2023   Complete tear of left rotator cuff 10/28/2022   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   Pilonidal cyst 09/13/2022   Aortic atherosclerosis (HCC) 06/07/2022   Hypercoagulable state due to paroxysmal atrial fibrillation (HCC) 03/23/2021   Type 2 diabetes mellitus with hyperglycemia, without long-term current use of insulin  (HCC) 02/10/2021   Neck pain on left side 03/06/2019   Musculoskeletal chest pain 07/24/2018   Asthma, mild intermittent 05/02/2018   Allergic rhinitis caused by mold 05/02/2018   Tobacco use 05/02/2018   Bilateral carpal tunnel syndrome 01/10/2018   Stroke (HCC)     Schizophrenia (HCC)     Migraine     History of nuclear stress test     History of alcohol abuse     GERD (gastroesophageal reflux disease)     Dysrhythmia     Chronic lower back pain     Anxiety     Arthritis of knee, right 04/12/2017   Bipolar disorder (HCC) 04/03/2017   Lipoma of forehead 09/22/2016   Trigger ring finger of right hand 06/22/2016   Paroxysmal atrial fibrillation (HCC)     AF (atrial fibrillation) (HCC) 12/10/2015   Eczema 07/10/2015   History of CVA (cerebrovascular accident) 05/04/2015   Tear of medial meniscus of right knee 03/18/2015   Chronic pain of right knee 03/12/2015   Other and unspecified hyperlipidemia 11/25/2013   Nasal congestion 11/25/2013   Sinusitis, chronic 05/09/2013   Chronic low back pain 10/12/2012   Lumbar radiculopathy 10/12/2012   Hypertension associated with diabetes (HCC) 10/12/2012   Depression 10/12/2012   Insomnia 10/12/2012        Past Medical History:  Diagnosis Date   Anemia     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)      a.) s/p embolization 08/19/2020: 29 mm RIGHT internal iliac artery aneurysm   Anxiety     Aortic atherosclerosis (HCC)     Bilateral carpal tunnel syndrome 01/10/2018   Bipolar disorder (HCC)     Chorioretinal inflammation of both eyes      a.) on azothioprine   Chronic  lower back pain     Coronary artery calcification seen on CT scan      a.) cCTA 04/21/2022: Ca score 20.6 (61st percentile for age/sex match control)   DDD (degenerative disc disease), cervical     Depression     Diastolic dysfunction      a.) TTE 01/13/2021: EF 60-65%, mod LVH, triv MR, G1DD   GERD (gastroesophageal reflux disease)     Hepatic steatosis     History of alcohol abuse  History of nuclear stress test      Myoview 10/16: EF 50%, diaphragmatic attenuation, no ischemia, low risk   Hypertension     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    a.) CT head 12/25/2012 --> RIGHT basal ganglia hypoattenuation related to remote lacunar infarct   Lipoma     Long term (current) use of anticoagulants      a.) apixaban    Long-term current use of immunomodulator      a.) on azothioprine for peripheral focal chorioretinal inflammation (both eyes)   Marijuana use     Migraine     Moderate persistent asthma with acute exacerbation 05/02/2018   PAF (paroxysmal atrial fibrillation) (HCC) 03/27/2015    a.) CHA2DS2VASc = 5 (HTN, CVA x2, vascular disease history, T2DM);  b.) s/p ablation 10/02/2015; c.) s/p ablation 12/10/2015; d.) s/p DCCV (200 J x 1) 12/11/2015; e.) s/p ablation 04/28/2022; f.) rate/rhythm maintained on oral diltiazem  + carvedilol ; chronically anticoagulated with apixaban    Pilonidal cyst     Schizophrenia (HCC)     Sciatica neuralgia     T2DM (type 2 diabetes mellitus) (HCC)     Thoracic aortic ectasia (HCC) 01/13/2021    a.) TTE 01/13/2021: Ao root 38 mm, asc Ao 39 mm             Family History  Problem Relation Age of Onset   Heart disease Father     Schizophrenia Sister               Past Surgical History:  Procedure Laterality Date   ATRIAL FIBRILLATION ABLATION   09/22/2015   ATRIAL FIBRILLATION ABLATION N/A 04/28/2022    Procedure: ATRIAL FIBRILLATION ABLATION;  Surgeon: Inocencio Soyla Lunger, MD;  Location: [facility];  Service: Cardiovascular;   Laterality: N/A;   CATARACT EXTRACTION Bilateral     CYST EXCISION   1996-97    surgery back of head    ELECTROPHYSIOLOGIC STUDY N/A 09/22/2015    Procedure: Atrial Fibrillation Ablation;  Surgeon: Will Lunger Inocencio, MD;  Location: [facility];  Service: Cardiovascular;  Laterality: N/A;   ELECTROPHYSIOLOGIC STUDY N/A 12/10/2015    Procedure: Atrial Fibrillation Ablation;  Surgeon: Will Lunger Inocencio, MD;  Location: [facility];  Service: Cardiovascular;  Laterality: N/A;   ELECTROPHYSIOLOGIC STUDY N/A 12/11/2015    Procedure: Cardioversion;  Surgeon: Will Lunger Inocencio, MD;  Location: [facility];  Service: Cardiovascular;  Laterality: N/A;   EMBOLIZATION (CATH LAB) Right 08/19/2020    Procedure: EMBOLIZATION;  Surgeon: Magda Debby SAILOR, MD;  Location: [facility];  Service: Cardiovascular;  Laterality: Right;  hypogastric   EXCISION MASS HEAD N/A 01/06/2017    Procedure: EXCISION MASS FOREHEAD;  Surgeon: Arelia Filippo, MD;  Location: [facility];  Service: Plastics;  Laterality: N/A;   EXCISION MASS UPPER EXTREMETIES Right 08/08/2022    Procedure: EXCISION MASS RIGHT FOREARM;  Surgeon: Murrell Drivers, MD;  Location: [facility];  Service: Orthopedics;  Laterality: Right;  45 MIN   GANGLION CYST EXCISION Left     INTERCOSTAL NERVE BLOCK   07/19/2003   KNEE ARTHROSCOPY Right 07/18/2014   MASS EXCISION N/A 01/25/2021    Procedure: EXCISION SUBCUTANEOUS VS SUBFASCIAL MASS TORSO 3CM;  Surgeon: Arelia Filippo, MD;  Location: [facility];  Service: Plastics;  Laterality: N/A;   PILONIDAL CYST EXCISION N/A 09/13/2022    Procedure: CYST EXCISION PILONIDAL EXTENSIVE;  Surgeon: Desiderio Schanz, MD;  Location: [facility];  Service: General;  Laterality: N/A;        Social History         Occupational History      Comment: disabled  Tobacco Use   Smoking status: Some Days      Current packs/day: 0.00      Types:  Cigarettes      Start date: 09/10/1993      Last attempt to quit: 09/10/2022      Years since quitting: 0.7      Passive exposure: Never   Smokeless tobacco: Never   Tobacco comments:      1 cigarettes every day  06-02-2021  Vaping Use   Vaping status: Never Used  Substance and Sexual Activity   Alcohol use: Yes      Alcohol/week: 6.0 standard drinks of alcohol      Types: 6 Cans of beer per week      Comment: 6 pack weekly 05/26/22   Drug use: Yes      Types: Marijuana      Comment: sunday was last time   Sexual activity: Not Currently          Persistent symptoms of the shoulder without improvement.  Exam is unchanged as documented above.  Plan shoulder arthroscopy rotator cuff repair and tenodesis long head biceps tendon.

## 2023-07-26 NOTE — Op Note (Signed)
 Pre and postop diagnosis: Long head biceps tendon subluxation of left shoulder with supraspinatus full-thickness tear.  Procedure: Left shoulder arthroscopy debridement of superior labral tear undersurface rotator cuff tear biceps tendon release with the ArthroCare wand.  Open biceps tenodesis and rotator cuff repair.  Surgeon: Oneil Franks, MD  Anesthesia preoperative block with Marcaine  and Exparel  plus general anesthesia +6 cc Marcaine  skin local.  Implants:mplants  Jason Stokes BIO - [id number]  Inventory Item: Jason Stokes BIO Serial no.: Model/Cat no.: [id number]  Implant name: Jason Stokes BIO - [id number] Laterality: Left Area: Shoulder  Manufacturer: [employer] Date of Manufacture:   Action: Implanted Number Used: 1   Device Identifier: Device Identifier Type:   Jason Stokes [place] - [id number]  Inventory Item: Jason Stokes BIO 7X1 Serial no.: Model/Cat no.: [id number]  Implant name: Jason Stokes SHARLIE 2K8 - [id number] Laterality: Left Area: Shoulder  Manufacturer: [employer] Date of Manufacture:   Action: Implanted Number Used: 1   Device Identifier: Device Identifier Type:   ANCHOR SUT BIO SW 4.75X19.1 - [id number]  Inventory Item: ANCHOR SUT BIO SW 4.75X19.1 Serial no.: Model/Cat no.: [id number]  Implant nameBETHA Jason DEEANN BIO SW [id number] - [id number] Laterality: Left Area: Shoulder  Manufacturer: [employer] Date of Manufacture:   Action: Implanted Number Used: 1   Device Identifier: Device Identifier Type:   Procedure:  After induction of anesthesia tract lobation shoulder frame beachchair position prepped the upper extremity down to the wrist with DuraPrep preoperative Ancef  prophylaxis usual shoulder splint sheets draped impervious stockinette Coban and arthroscopic patch was applied timeout procedure completed.  Scope was introduced from posterior portal anterior portal was made in for the biceps tendon within that technique.   Biceps tendon was observed exiting the shoulder and could be seen subluxing.  There is a biceps tendinosis and fraying present.  Undersurface the rotator cuff showed full-thickness supraspinatus tear and undersurface fraying which was trimmed and smoothed.  Superior labrum was torn and this was debrided with a shaver.  ArthroCare wand was used to divide the biceps tendon off of the superior aspect of the labrum cauterizing it and shoulder was then suctioned dry.  Incision was made along the anterior aspect of the acromion deltoid is peeled off there is prominent anterior spur 1 cm which was resected and then the undersurface of the acromion had 4 to 5 mm removed which was impinging.  Subacromial bursectomy was performed.  Rotator cuff was cut back a few millimeters freshen surface there was small split V shaped tear full-thickness and tips of the probe could be placed into the joint with arthroscopic fluid extending with shoulder motion.  Biceps tendon was hooked with a right angle clamp delivered sutures were placed and initially 6.5 swivel lock was tried but it was too small than how well we had to switch to a 7 in the 6.5 was still in the proximal humerus 7 gave good cortical squeaking and tendon was pushed in first securing it with the screw and then double tying the ends of the suture locking in the screw sideways.  Biceps tendon went and the hole was secure.  Next rotator cuff which been cut back bone was freshened up with a barrier unsure and single 4.75 swivel lock was inserted using the trocar pounding into second line inserting the swivel lock with excellent cortical fit and then suturing down the split tear side-to-side pulling it together with depression bony surface and then oversewing at the  top with remaining suture which was FiberWire.  A piece of the FiberWire was used to repair the deltoid back to holes in the bone either made with that needle free needle or if it went past using towel clips to  complete the hall and then passing the suture through grasping the deltoid and pulling the deltoid up to the freshen near the acromion which had had the spur resected.  Laterally the 1 cm split head of figure-of-eight suture placed pulling muscle to muscle without overdo tension.  Subtendinous tissue reapproximated with 2-0 Vicryl 3-0 Vicryl septic or closure tincture benzoin Steri-Strips.  3-0 nylon anterior posterior portal tincture benzoin Steri-Strips Marcaine  infiltration 60 cc postop dressing in shoulder immobilizer.  Instrument count needle count was correct.  Patient tolerated procedure well transferred to cover room in stable condition.

## 2023-07-26 NOTE — Progress Notes (Signed)
Assisted Dr. Foster with left, supraclavicular, ultrasound guided block. Side rails up, monitors on throughout procedure. See vital signs in flow sheet. Tolerated Procedure well. 

## 2023-07-26 NOTE — Discharge Instructions (Addendum)
 Keep your dressing on x 48 hours.  You can then take the sling off and you have to keep your arm across her chest without moving her shoulder and dressing can be removed and you can take a shower leaving the Steri-Strips on and while you shower always keeping your arm across her chest and not moving it.  After you shower dry off with a towel and you can put gauze over the Steri-Strips on top your shoulder and 1 Band-Aid in the front over the single suture and 1 Band-Aid in the back at the other arthroscopic portal.  Then reapply your sling keeping her arm across her chest.  He will do better sleeping in recliner for several days.  He will do better in a beachchair position and keep some ice on your shoulder off and on not constantly since she just had a blockage in your shoulder may be numb.  Pain medicine has been sent into your pharmacy.  He will see Dr. Barbarann in about 1 week in the office for follow-up. Post Anesthesia Home Care Instructions  Activity: Get plenty of rest for the remainder of the day. A responsible individual must stay with you for 24 hours following the procedure.  For the next 24 hours, DO NOT: -Drive a car -Advertising copywriter -Drink alcoholic beverages -Take any medication unless instructed by your physician -Make any legal decisions or sign important papers.  Meals: Start with liquid foods such as gelatin or soup. Progress to regular foods as tolerated. Avoid greasy, spicy, heavy foods. If nausea and/or vomiting occur, drink only clear liquids until the nausea and/or vomiting subsides. Call your physician if vomiting continues.  Special Instructions/Symptoms: Your throat may feel dry or sore from the anesthesia or the breathing tube placed in your throat during surgery. If this causes discomfort, gargle with warm salt water. The discomfort should disappear within 24 hours.  If you had a scopolamine  patch placed behind your ear for the management of post- operative nausea  and/or vomiting:  1. The medication in the patch is effective for 72 hours, after which it should be removed.  Wrap patch in a tissue and discard in the trash. Wash hands thoroughly with soap and water. 2. You may remove the patch earlier than 72 hours if you experience unpleasant side effects which may include dry mouth, dizziness or visual disturbances. 3. Avoid touching the patch. Wash your hands with soap and water after contact with the patch.   Regional Anesthesia Blocks  1. You may not be able to move or feel the blocked extremity after a regional anesthetic block. This may last may last from 3-48 hours after placement, but it will go away. The length of time depends on the medication injected and your individual response to the medication. As the nerves start to wake up, you may experience tingling as the movement and feeling returns to your extremity. If the numbness and inability to move your extremity has not gone away after 48 hours, please call your surgeon.   2. The extremity that is blocked will need to be protected until the numbness is gone and the strength has returned. Because you cannot feel it, you will need to take extra care to avoid injury. Because it may be weak, you may have difficulty moving it or using it. You may not know what position it is in without looking at it while the block is in effect.  3. For blocks in the legs and feet, returning to  weight bearing and walking needs to be done carefully. You will need to wait until the numbness is entirely gone and the strength has returned. You should be able to move your leg and foot normally before you try and bear weight or walk. You will need someone to be with you when you first try to ensure you do not fall and possibly risk injury.  4. Bruising and tenderness at the needle site are common side effects and will resolve in a few days.  5. Persistent numbness or new problems with movement should be communicated to the  surgeon or the [facility] [phone] [facility] [phone]).Information for Discharge Teaching: EXPAREL  (bupivacaine  liposome injectable suspension)   Pain relief is important to your recovery. The goal is to control your pain so you can move easier and return to your normal activities as soon as possible after your procedure. Your physician may use several types of medicines to manage pain, swelling, and more.  Your surgeon or anesthesiologist gave you EXPAREL (bupivacaine ) to help control your pain after surgery.  EXPAREL  is a local anesthetic designed to release slowly over an extended period of time to provide pain relief by numbing the tissue around the surgical site. EXPAREL  is designed to release pain medication over time and can control pain for up to 72 hours. Depending on how you respond to EXPAREL , you may require less pain medication during your recovery. EXPAREL  can help reduce or eliminate the need for opioids during the first few days after surgery when pain relief is needed the most. EXPAREL  is not an opioid and is not addictive. It does not cause sleepiness or sedation.   Important! A teal colored band has been placed on your arm with the date, time and amount of EXPAREL  you have received. Please leave this armband in place for the full 96 hours following administration, and then you may remove the band. If you return to the hospital for any reason within 96 hours following the administration of EXPAREL , the armband provides important information that your health care providers to know, and alerts them that you have received this anesthetic.    Possible side effects of EXPAREL : Temporary loss of sensation or ability to move in the area where medication was injected. Nausea, vomiting, constipation Rarely, numbness and tingling in your mouth or lips, lightheadedness, or anxiety may occur. Call your doctor right away if you think you may be  experiencing any of these sensations, or if you have other questions regarding possible side effects.  Follow all other discharge instructions given to you by your surgeon or nurse. Eat a healthy diet and drink plenty of water or other fluids.

## 2023-07-26 NOTE — Anesthesia Postprocedure Evaluation (Signed)
 Anesthesia Post Note  Patient: CEJAY CAMBRE  Procedure(s) Performed: LEFT SHOULDER ARTHROSCOPY WITH OPEN ROTATOR CUFF REPAIR AND OPEN BICEPS TENODESIS (Left: Shoulder)     Patient location during evaluation: PACU Anesthesia Type: General Level of consciousness: awake and alert and oriented Pain management: pain level controlled Vital Signs Assessment: post-procedure vital signs reviewed and stable Respiratory status: spontaneous breathing, nonlabored ventilation and respiratory function stable Cardiovascular status: blood pressure returned to baseline and stable Postop Assessment: no apparent nausea or vomiting Anesthetic complications: no   No notable events documented.  Last Vitals:  Vitals:   07/26/23 1530 07/26/23 1545  BP: 135/87 (!) 125/93  Pulse: 93 99  Resp: 20 15  Temp:    SpO2: 100% 100%    Last Pain:  Vitals:   07/26/23 1545  TempSrc:   PainSc: 0-No pain                 Taite Schoeppner A.

## 2023-07-26 NOTE — Interval H&P Note (Signed)
 History and Physical Interval Note:  07/26/2023 12:25 PM  Jason Stokes  has presented today for surgery, with the diagnosis of left shoulder rotator cuff tear, biceps tendinopathy.  The various methods of treatment have been discussed with the patient and family. After consideration of risks, benefits and other options for treatment, the patient has consented to  Procedure(s): LEFT SHOULDER ARTHROSCOPY WITH OPEN ROTATOR CUFF REPAIR AND OPEN BICEPS TENODESIS (Left) as a surgical intervention.  The patient's history has been reviewed, patient examined, no change in status, stable for surgery.  I have reviewed the patient's chart and labs.  Questions were answered to the patient's satisfaction.     Oneil [name]

## 2023-07-26 NOTE — Transfer of Care (Signed)
 Immediate Anesthesia Transfer of Care Note  [name]  Procedure(s) Performed: LEFT SHOULDER ARTHROSCOPY WITH OPEN ROTATOR CUFF REPAIR AND OPEN BICEPS TENODESIS (Left: Shoulder)  Patient Location: PACU  Anesthesia Type:General and Regional  Level of Consciousness: awake, alert , and oriented  Airway & Oxygen Therapy: Patient Spontanous Breathing and Patient connected to face mask oxygen  Post-op Assessment: Report given to RN and Post -op Vital signs reviewed and stable  Post vital signs: Reviewed and stable  Last Vitals:  Vitals Value Taken Time  BP 131/92 07/26/23 1511  Temp    Pulse 91 07/26/23 1513  Resp 19 07/26/23 1513  SpO2 100 % 07/26/23 1513  Vitals shown include unfiled device data.  Last Pain:  Vitals:   07/26/23 1107  TempSrc: Oral  PainSc: 9       Patients Stated Pain Goal: 6 (07/26/23 1107)  Complications: No notable events documented.

## 2023-07-27 ENCOUNTER — Encounter (HOSPITAL_BASED_OUTPATIENT_CLINIC_OR_DEPARTMENT_OTHER): Payer: Self-pay | Admitting: Orthopaedic Surgery

## 2023-07-27 ENCOUNTER — Telehealth: Payer: Self-pay | Admitting: Orthopaedic Surgery

## 2023-07-27 NOTE — Telephone Encounter (Signed)
 Gavin Pound called from [place]. Need auth for oxycodone for patient. Cb# [phone]

## 2023-07-27 NOTE — Telephone Encounter (Signed)
 I called. Prior auth started. There are still locks on patient's rx in regards to provider. He has spoken with insurance and so have I. He has to get lock removed. Just waiting for insurance authorization.

## 2023-07-28 ENCOUNTER — Telehealth: Payer: Self-pay

## 2023-07-28 NOTE — Telephone Encounter (Signed)
 Copied from CRM [id number]. Topic: General - Other >> Jul 28, 2023 11:28 AM Rosaria BRAVO wrote: Reason for CRM: Pt called requesting to speak to Alycia urgently, says he just had surgery on Wednesday and he is without pain pills.   Best contact: [phone]

## 2023-07-28 NOTE — Telephone Encounter (Signed)
 Received faxed message (under Media tab) that prior auth was not needed.

## 2023-07-28 NOTE — Telephone Encounter (Signed)
 Copied from CRM [id number]. Topic: Referral - Question >> Jul 28, 2023 10:58 AM Leonette SQUIBB wrote: Reason for CRM: [facility] care program called saying they did not get a referral to dr. Barbarann and the patient is needing the provider Dr. Barbarann to get medication but because there is no referral he can not get the pain medication.  He is in a lot of pain and needs the medication  CB@  [phone] Case # [id number]

## 2023-07-31 NOTE — Telephone Encounter (Signed)
 Pt was called and he states that he has gotten the medication from his orthopedic

## 2023-08-01 ENCOUNTER — Ambulatory Visit (INDEPENDENT_AMBULATORY_CARE_PROVIDER_SITE_OTHER): Payer: Medicaid Other | Admitting: Allergy and Immunology

## 2023-08-01 ENCOUNTER — Other Ambulatory Visit: Payer: Self-pay | Admitting: Allergy and Immunology

## 2023-08-01 ENCOUNTER — Telehealth: Payer: Self-pay | Admitting: Family Medicine

## 2023-08-01 VITALS — BP 134/80 | HR 93 | Temp 99.1°F | Resp 16

## 2023-08-01 DIAGNOSIS — J4550 Severe persistent asthma, uncomplicated: Secondary | ICD-10-CM | POA: Diagnosis not present

## 2023-08-01 DIAGNOSIS — F1290 Cannabis use, unspecified, uncomplicated: Secondary | ICD-10-CM

## 2023-08-01 DIAGNOSIS — H1013 Acute atopic conjunctivitis, bilateral: Secondary | ICD-10-CM

## 2023-08-01 DIAGNOSIS — H1010 Acute atopic conjunctivitis, unspecified eye: Secondary | ICD-10-CM

## 2023-08-01 DIAGNOSIS — J301 Allergic rhinitis due to pollen: Secondary | ICD-10-CM

## 2023-08-01 DIAGNOSIS — R21 Rash and other nonspecific skin eruption: Secondary | ICD-10-CM

## 2023-08-01 DIAGNOSIS — J3089 Other allergic rhinitis: Secondary | ICD-10-CM | POA: Diagnosis not present

## 2023-08-01 MED ORDER — OLOPATADINE HCL 0.2 % OP SOLN: 1.0000 [drp] | Freq: Every day | OPHTHALMIC | 1 refills | Status: DC | PRN

## 2023-08-01 MED ORDER — ALBUTEROL SULFATE HFA 108 (90 BASE) MCG/ACT IN AERS: 2.0000 | INHALATION_SPRAY | RESPIRATORY_TRACT | 1 refills | Status: DC | PRN

## 2023-08-01 MED ORDER — FLUTICASONE PROPIONATE 50 MCG/ACT NA SUSP: 1.0000 | Freq: Two times a day (BID) | NASAL | 1 refills | Status: DC

## 2023-08-01 MED ORDER — AZELASTINE HCL 0.1 % NA SOLN: 1.0000 | Freq: Two times a day (BID) | NASAL | 1 refills | Status: DC

## 2023-08-01 MED ORDER — FLUTICASONE FUROATE-VILANTEROL 200-25 MCG/ACT IN AEPB: 1.0000 | INHALATION_SPRAY | Freq: Every morning | RESPIRATORY_TRACT | 1 refills | Status: DC

## 2023-08-01 MED ORDER — CETIRIZINE HCL 10 MG PO TABS: 10.0000 mg | ORAL_TABLET | Freq: Every day | ORAL | 1 refills | Status: DC | PRN

## 2023-08-01 MED ORDER — MONTELUKAST SODIUM 10 MG PO TABS: 10.0000 mg | ORAL_TABLET | Freq: Every day | ORAL | 1 refills | Status: DC

## 2023-08-01 NOTE — Patient Instructions (Signed)
  1.  Continue to Treat and prevent inflammation of airway:   A.  Flonase  1 spray each nostril twice a day  B.  Azelastine  nasal spray 1 spray each nostril twice a day  C.  Montelukast  10 mg 1 tablet 1 time per day  D.  Breo 200 - 1 inhalation 1 time per day  E.  Immunotherapy  2. If needed:   A.  Cetirizine  10 mg 1 tablet 1-2 times a day  B.  Pataday  - 1 drop each eye 1 time per day  C.  Pro-air HFA 2 puffs every 4-6 hours  3. Return to clinic in 6 months or earlier if problem  4. Influenza = Tamiflu. Covid = Paxlovid

## 2023-08-01 NOTE — Telephone Encounter (Signed)
 Copied from CRM [id number]. Topic: General - Other >> Jul 31, 2023  4:19 PM Everette C wrote: Reason for CRM: The patient would like to be contacted by A. Fearington when possible  The patient would like to speak with a member of staff regarding their recent labs   Please contact when available

## 2023-08-01 NOTE — Progress Notes (Signed)
 [place] [facility] [place] [facility] [place] - [place] - [place]   Follow-up Note  Referring Provider: Delbert Clam, MD Primary Provider: Delbert Clam, MD Date of Office Visit: 08/01/2023  Subjective:   Jason Stokes (DOB: 1961-03-10) is a 63 y.o. male who [facility] in re-evaluation of the following:  HPI: Jason Stokes returns to this clinic in evaluation of asthma, allergic rhinoconjunctivitis, and a history of cannabis smoke exposure.  I last saw him in this clinic 29 Nov 2022.  Overall he has done very well with his airway.  He has not required a systemic steroid or antibiotic for any type of airway issue.  His use of a short acting bronchodilator is not greater than twice a week.  He continues to use anti-inflammatory medications for both his upper and lower airway on a consistent basis and continues on immunotherapy currently at every 3 weeks without any adverse effect.  He has switched over for the most part using THC Gummies instead of smoking cannabis.  He still occasionally gets some smoke exposure.  He has had this years flu vaccine.  He had surgery on his left rotator cuff last week and apparently did quite well with anesthesia.  Allergies as of 08/01/2023       Reactions   Lisinopril Anaphylaxis   Swelling of lips and tongue.   Tylenol  [acetaminophen ] Other (See Comments)   Acid reflux    Lyrica  [pregabalin ] Palpitations        Medication List    Accu-Chek Guide test strip Generic drug: glucose blood Use to check blood sugar once daily. E11.69   albuterol  108 (90 Base) MCG/ACT inhaler Commonly known as: Ventolin  HFA Inhale 2 puffs into the lungs every 4 (four) hours as needed for wheezing or shortness of breath.   apixaban  5 MG Tabs tablet Commonly known as: Eliquis  Take 1 tablet (5 mg total) by mouth 2 (two) times daily.   atorvastatin  40 MG tablet Commonly known as: LIPITOR Take 1 tablet (40 mg total) by mouth  daily.   azaTHIOprine 50 MG tablet Commonly known as: IMURAN Take 100 mg by mouth every morning.   azelastine  0.1 % nasal spray Commonly known as: ASTELIN  Place 1 spray into both nostrils 2 (two) times daily. 1 spray each nostril twice a day   brimonidine 0.2 % ophthalmic solution Commonly known as: ALPHAGAN Place 1 drop into both eyes every 8 (eight) hours.   carvedilol  25 MG tablet Commonly known as: COREG  Take 1 tablet (25 mg total) by mouth 2 (two) times daily.   cetirizine  10 MG tablet Commonly known as: ZYRTEC  Take 1 tablet (10 mg total) by mouth daily as needed for allergies (Can take an extra dose during flare ups.).   cromolyn  4 % ophthalmic solution Commonly known as: OPTICROM  Place 1 drop into both eyes 4 (four) times daily as needed.   Difluprednate 0.05 % Emul Place 1 drop into both eyes 2 (two) times daily.   diltiazem  180 MG 24 hr capsule Commonly known as: CARDIZEM  CD TAKE 1 CAPSULE BY MOUTH EVERY DAY   dorzolamide-timolol 2-0.5 % ophthalmic solution Commonly known as: COSOPT Place 1 drop into both eyes 2 (two) times daily.   DULoxetine  60 MG capsule Commonly known as: Cymbalta  Take 1 capsule (60 mg total) by mouth daily. For chronic pain and neuropathy   EPINEPHrine  0.3 mg/0.3 mL Soaj injection Commonly known as: EPI-PEN Inject 0.3 mg into the muscle as needed for anaphylaxis.  fluticasone  50 MCG/ACT nasal spray Commonly known as: FLONASE  1 spray each nostril twice a day   fluticasone  furoate-vilanterol 200-25 MCG/ACT Aepb Commonly known as: Breo Ellipta  Inhale 1 puff into the lungs daily.   folic acid 1 MG tablet Commonly known as: FOLVITE Take 1 mg by mouth daily.   furosemide  20 MG tablet Commonly known as: LASIX  Take 1 tablet (20 mg total) by mouth daily.   GARLIC PO Take 1 tablet by mouth daily.   ipratropium 0.03 % nasal spray Commonly known as: ATROVENT  Place 2 sprays into both nostrils 4 (four) times daily as needed for  rhinitis.   latanoprost 0.005 % ophthalmic solution Commonly known as: XALATAN Place 1 drop into both eyes daily.   methocarbamol  500 MG tablet Commonly known as: ROBAXIN  Take 1 tablet (500 mg total) by mouth every 8 (eight) hours as needed for muscle spasms.   Misc. Devices Misc Back brace. Diagnosis chronic back pain   Misc. Devices Misc Left knee brace. Diagnosis L knee pain   montelukast  10 MG tablet Commonly known as: Singulair  Take 1 tablet (10 mg total) by mouth at bedtime.   Olopatadine  HCl 0.2 % Soln Commonly known as: Pataday  Place 1 drop into both eyes daily as needed.   omeprazole  20 MG capsule Commonly known as: PRILOSEC TAKE 1 CAPSULE BY MOUTH EVERY DAY   oxyCODONE -acetaminophen  5-325 MG tablet Commonly known as: Percocet Take 1-2 tablets by mouth every 6 (six) hours as needed for severe pain (pain score 7-10).   valsartan  80 MG tablet Commonly known as: DIOVAN  Take 1 tablet (80 mg total) by mouth daily.   vitamin D3 25 MCG tablet Commonly known as: CHOLECALCIFEROL Take 1,000 Units by mouth daily.    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)    a.) s/p embolization 08/19/2020: 29 mm RIGHT internal iliac artery aneurysm   Anxiety    Aortic atherosclerosis (HCC)    Bilateral carpal tunnel syndrome 01/10/2018   Bipolar disorder (HCC)    Chorioretinal inflammation of both eyes    a.) on azothioprine   Chronic lower back pain    Coronary artery calcification seen on CT scan    a.) cCTA 04/21/2022: Ca score 20.6 (61st percentile for age/sex match control)   DDD (degenerative disc disease), cervical    Depression    Diastolic dysfunction    a.) TTE 01/13/2021: EF 60-65%, mod LVH, triv MR, G1DD   GERD (gastroesophageal reflux disease)    Hepatic steatosis    History of alcohol abuse    History of nuclear stress test    Myoview 10/16: EF 50%, diaphragmatic attenuation, no ischemia, low risk    Hypertension    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   a.) CT head 12/25/2012 --> RIGHT basal ganglia hypoattenuation related to remote lacunar infarct   Lipoma    Long term (current) use of anticoagulants    a.) apixaban    Long-term current use of immunomodulator    a.) on azothioprine for peripheral focal chorioretinal inflammation (both eyes)   Marijuana use    Migraine    Moderate persistent asthma with acute exacerbation 05/02/2018   PAF (paroxysmal atrial fibrillation) (HCC) 03/27/2015   a.) CHA2DS2VASc = 5 (HTN, CVA x2, vascular disease history, T2DM);  b.) s/p ablation 10/02/2015; c.) s/p ablation 12/10/2015; d.) s/p DCCV (200 J x 1) 12/11/2015; e.) s/p ablation 04/28/2022; f.) rate/rhythm maintained on oral diltiazem  + carvedilol ; chronically anticoagulated  with apixaban    Pilonidal cyst    Schizophrenia (HCC)    Sciatica neuralgia    T2DM (type 2 diabetes mellitus) (HCC)    Thoracic aortic ectasia (HCC) 01/13/2021   a.) TTE 01/13/2021: Ao root 38 mm, asc Ao 39 mm    Past Surgical History:  Procedure Laterality Date   ATRIAL FIBRILLATION ABLATION  09/22/2015   ATRIAL FIBRILLATION ABLATION N/A 04/28/2022   Procedure: ATRIAL FIBRILLATION ABLATION;  Surgeon: Inocencio Soyla Lunger, MD;  [facility];  Service: Cardiovascular;  Laterality: N/A;   CATARACT EXTRACTION Bilateral    CYST EXCISION  1996-97   surgery back of head    ELECTROPHYSIOLOGIC STUDY N/A 09/22/2015   Procedure: Atrial Fibrillation Ablation;  Surgeon: Will Lunger Inocencio, MD;  [facility];  Service: Cardiovascular;  Laterality: N/A;   ELECTROPHYSIOLOGIC STUDY N/A 12/10/2015   Procedure: Atrial Fibrillation Ablation;  Surgeon: Will Lunger Inocencio, MD;  [facility];  Service: Cardiovascular;  Laterality: N/A;   ELECTROPHYSIOLOGIC STUDY N/A 12/11/2015   Procedure: Cardioversion;  Surgeon: Will Lunger Inocencio, MD;  [facility];  Service:  Cardiovascular;  Laterality: N/A;   EMBOLIZATION (CATH LAB) Right 08/19/2020   Procedure: EMBOLIZATION;  Surgeon: Magda Debby SAILOR, MD;  [facility];  Service: Cardiovascular;  Laterality: Right;  hypogastric   EXCISION MASS HEAD N/A 01/06/2017   Procedure: EXCISION MASS FOREHEAD;  Surgeon: Arelia Filippo, MD;  Location: [facility];  Service: Plastics;  Laterality: N/A;   EXCISION MASS UPPER EXTREMETIES Right 08/08/2022   Procedure: EXCISION MASS RIGHT FOREARM;  Surgeon: Murrell Drivers, MD;  Location: [facility];  Service: Orthopedics;  Laterality: Right;  45 MIN   GANGLION CYST EXCISION Left    INTERCOSTAL NERVE BLOCK  07/19/2003   KNEE ARTHROSCOPY Right 07/18/2014   MASS EXCISION N/A 01/25/2021   Procedure: EXCISION SUBCUTANEOUS VS SUBFASCIAL MASS TORSO 3CM;  Surgeon: Arelia Filippo, MD;  Location: [facility];  Service: Plastics;  Laterality: N/A;   PILONIDAL CYST EXCISION N/A 09/13/2022   Procedure: CYST EXCISION PILONIDAL EXTENSIVE;  Surgeon: Desiderio Schanz, MD;  Location: [facility];  Service: General;  Laterality: N/A;   SHOULDER ARTHROSCOPY WITH ROTATOR CUFF REPAIR AND OPEN BICEPS TENODESIS Left 07/26/2023   Procedure: LEFT SHOULDER ARTHROSCOPY WITH OPEN ROTATOR CUFF REPAIR AND OPEN BICEPS TENODESIS;  Surgeon: Barbarann Oneil BROCKS, MD;  Location: [facility];  Service: Orthopedics;  Laterality: Left;    Review of systems negative except as noted in HPI / PMHx or noted below:  Review of Systems  Constitutional: Negative.   HENT: Negative.    Eyes: Negative.   Respiratory: Negative.    Cardiovascular: Negative.   Gastrointestinal: Negative.   Genitourinary: Negative.   Musculoskeletal: Negative.   Skin: Negative.   Neurological: Negative.   Endo/Heme/Allergies: Negative.   Psychiatric/Behavioral: Negative.       Objective:   Vitals:   08/01/23 0912  BP: 134/80  Pulse: 93  Resp: 16  Temp: 99.1 F (37.3  C)  SpO2: 96%          Physical Exam Constitutional:      Appearance: He is not diaphoretic.  HENT:     Head: Normocephalic.     Right Ear: Tympanic membrane, ear canal and external ear normal.     Left Ear: Tympanic membrane, ear canal and external ear normal.     Nose: Nose normal. No mucosal edema or rhinorrhea.  Mouth/Throat:     Pharynx: Uvula midline. No oropharyngeal exudate.  Eyes:     Conjunctiva/sclera: Conjunctivae normal.  Neck:     Thyroid : No thyromegaly.     Trachea: Trachea normal. No tracheal tenderness or tracheal deviation.  Cardiovascular:     Rate and Rhythm: Normal rate and regular rhythm.     Heart sounds: Normal heart sounds, S1 normal and S2 normal. No murmur heard. Pulmonary:     Effort: No respiratory distress.     Breath sounds: Normal breath sounds. No stridor. No wheezing or rales.  Musculoskeletal:     Comments: Left upper extremity brace  Lymphadenopathy:     Head:     Right side of head: No tonsillar adenopathy.     Left side of head: No tonsillar adenopathy.     Cervical: No cervical adenopathy.  Skin:    Findings: No erythema or rash.     Nails: There is no clubbing.  Neurological:     Mental Status: He is alert.     Diagnostics: Spirometry was performed and demonstrated an FEV1 of 2.81 at 86 % of predicted.   Assessment and Plan:   1. Asthma, severe persistent, well-controlled   2. Perennial allergic rhinitis   3. Seasonal allergic rhinitis due to pollen   4. Perennial allergic conjunctivitis of both eyes   5. Seasonal allergic conjunctivitis   6. Cannabis use, uncomplicated    1.  Continue to Treat and prevent inflammation of airway:   A.  Flonase  1 spray each nostril twice a day  B.  Azelastine  nasal spray 1 spray each nostril twice a day  C.  Montelukast  10 mg 1 tablet 1 time per day  D.  Breo 200 - 1 inhalation 1 time per day  E.  Immunotherapy  2. If needed:   A.  Cetirizine  10 mg 1 tablet 1-2 times a  day  B.  Pataday  - 1 drop each eye 1 time per day  C.  Pro-air HFA 2 puffs every 4-6 hours  3. Return to clinic in 6 months or earlier if problem  4. Influenza = Tamiflu. Covid = Paxlovid  Bode appears to be doing pretty well and he will remain on a collection of anti-inflammatory agents for his airway including immunotherapy and if he continues to do this well we will see him back in this clinic in 6 months or earlier if there is a problem.  Camellia Denis, MD Allergy / Immunology [facility]

## 2023-08-02 ENCOUNTER — Encounter: Payer: Self-pay | Admitting: Allergy and Immunology

## 2023-08-02 NOTE — Telephone Encounter (Signed)
 Patient states that he saw his eye doctor on 07/25/2023(note in epic) and his WBC was elevated and he is wanting to get his levels rechecked.

## 2023-08-02 NOTE — Telephone Encounter (Signed)
 Pt calling back and states he needs a call back asap.  This is still concerning his labs.  He said his eye dr told him his white blood count was low, but Dr Adan Holms said all was ok w/ his blood work.  Pt states it has been over 48 hrs since he called, and he expects a call back today.  He said,"you don't want me to come up there."

## 2023-08-02 NOTE — Telephone Encounter (Signed)
 Order has been placed.

## 2023-08-02 NOTE — Addendum Note (Signed)
 Addended by: Elmarie Devlin on: 08/02/2023 05:23 PM   Modules accepted: Orders

## 2023-08-03 ENCOUNTER — Ambulatory Visit: Payer: Medicaid Other | Attending: Family Medicine

## 2023-08-03 DIAGNOSIS — R21 Rash and other nonspecific skin eruption: Secondary | ICD-10-CM | POA: Diagnosis not present

## 2023-08-03 NOTE — Telephone Encounter (Signed)
Patient was called and informed that order were placed for lab work.

## 2023-08-04 ENCOUNTER — Encounter: Payer: Self-pay | Admitting: Orthopaedic Surgery

## 2023-08-04 ENCOUNTER — Ambulatory Visit (INDEPENDENT_AMBULATORY_CARE_PROVIDER_SITE_OTHER): Payer: Medicaid Other | Admitting: Orthopaedic Surgery

## 2023-08-04 VITALS — Ht 73.0 in | Wt 245.0 lb

## 2023-08-04 DIAGNOSIS — M75122 Complete rotator cuff tear or rupture of left shoulder, not specified as traumatic: Secondary | ICD-10-CM

## 2023-08-04 LAB — CBC WITH DIFFERENTIAL/PLATELET
Basophils Absolute: 0 10*3/uL (ref 0.0–0.2)
Basos: 0 %
EOS (ABSOLUTE): 0.1 10*3/uL (ref 0.0–0.4)
Eos: 2 %
Hematocrit: 37.1 % — ABNORMAL LOW (ref 37.5–51.0)
Hemoglobin: 11.5 g/dL — ABNORMAL LOW (ref 13.0–17.7)
Immature Grans (Abs): 0 10*3/uL (ref 0.0–0.1)
Immature Granulocytes: 0 %
Lymphocytes Absolute: 1.3 10*3/uL (ref 0.7–3.1)
Lymphs: 29 %
MCH: 23.8 pg — ABNORMAL LOW (ref 26.6–33.0)
MCHC: 31 g/dL — ABNORMAL LOW (ref 31.5–35.7)
MCV: 77 fL — ABNORMAL LOW (ref 79–97)
Monocytes Absolute: 0.4 10*3/uL (ref 0.1–0.9)
Monocytes: 8 %
Neutrophils Absolute: 2.7 10*3/uL (ref 1.4–7.0)
Neutrophils: 61 %
Platelets: 247 10*3/uL (ref 150–450)
RBC: 4.84 x10E6/uL (ref 4.14–5.80)
RDW: 17.5 % — ABNORMAL HIGH (ref 11.6–15.4)
WBC: 4.5 10*3/uL (ref 3.4–10.8)

## 2023-08-04 NOTE — Progress Notes (Signed)
Post-Op Visit Note   [name]           Date of Birth: 07-03-1961           MRN: [id number] Visit Date: 08/04/2023 PCP: Hoy Register, MD   Assessment & Plan: Postop shoulder arthroscopy rotator cuff repair and biceps tenodesis.  Steri-Strips change arthroscopic portal sutures removed.  He will work on Theme park manager on the floor, elephant swings.  Recheck 4 weeks will discuss pulley and supine flexion exercises of the brain were bad in his hands.  Chief Complaint:  Chief Complaint  Patient presents with   Left Shoulder - Routine Post Op    07/26/2023 left shoulder arthroscopy, open RCR, open BT   Visit Diagnoses:  1. Complete tear of left rotator cuff, unspecified whether traumatic     Plan: Return in 4 weeks.  Follow-Up Instructions: Return in about 4 weeks (around 09/01/2023).   Orders:  No orders of the defined types were placed in this encounter.  No orders of the defined types were placed in this encounter.   Imaging: No results found.  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   Pilonidal cyst 09/13/2022   Aortic atherosclerosis (HCC) 06/07/2022   Hypercoagulable state due to paroxysmal atrial fibrillation (HCC) 03/23/2021   Type 2 diabetes mellitus with hyperglycemia, without long-term current use of insulin (HCC) 02/10/2021   Neck pain on left side 03/06/2019   Musculoskeletal chest pain 07/24/2018   Asthma, mild intermittent 05/02/2018   Allergic rhinitis caused by mold 05/02/2018   Tobacco use 05/02/2018   Bilateral carpal tunnel syndrome 01/10/2018   Stroke (HCC)    Schizophrenia (HCC)    Migraine    History of nuclear stress test    History of alcohol abuse    GERD (gastroesophageal reflux disease)    Dysrhythmia    Chronic lower back pain     Anxiety    Arthritis of knee, right 04/12/2017   Bipolar disorder (HCC) 04/03/2017   Lipoma of forehead 09/22/2016   Trigger ring finger of right hand 06/22/2016   Paroxysmal atrial fibrillation (HCC)    AF (atrial fibrillation) (HCC) 12/10/2015   Eczema 07/10/2015   History of CVA (cerebrovascular accident) 05/04/2015   Tear of medial meniscus of right knee 03/18/2015   Chronic pain of right knee 03/12/2015   Other and unspecified hyperlipidemia 11/25/2013   Nasal congestion 11/25/2013   Sinusitis, chronic 05/09/2013   Chronic low back pain 10/12/2012   Lumbar radiculopathy 10/12/2012   Hypertension associated with diabetes (HCC) 10/12/2012   Depression 10/12/2012   Insomnia 10/12/2012   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)    a.) s/p embolization 08/19/2020: 29 mm RIGHT internal iliac artery aneurysm   Anxiety    Aortic atherosclerosis (HCC)    Bilateral carpal tunnel syndrome 01/10/2018   Bipolar disorder (HCC)    Chorioretinal inflammation of both eyes    a.) on azothioprine   Chronic lower back pain    Coronary artery calcification seen on CT scan    a.) cCTA 04/21/2022: Ca score 20.6 (61st percentile for age/sex match control)   DDD (degenerative disc disease), cervical    Depression    Diastolic dysfunction    a.)  TTE 01/13/2021: EF 60-65%, mod LVH, triv MR, G1DD   GERD (gastroesophageal reflux disease)    Hepatic steatosis    History of alcohol abuse    History of nuclear stress test    Myoview 10/16: EF 50%, diaphragmatic attenuation, no ischemia, low risk   Hypertension    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   a.) CT head 12/25/2012 --> RIGHT basal ganglia hypoattenuation related to remote lacunar infarct   Lipoma    Long term (current) use of anticoagulants    a.) apixaban   Long-term current use of immunomodulator    a.) on azothioprine for peripheral focal chorioretinal inflammation (both eyes)    Marijuana use    Migraine    Moderate persistent asthma with acute exacerbation 05/02/2018   PAF (paroxysmal atrial fibrillation) (HCC) 03/27/2015   a.) CHA2DS2VASc = 5 (HTN, CVA x2, vascular disease history, T2DM);  b.) s/p ablation 10/02/2015; c.) s/p ablation 12/10/2015; d.) s/p DCCV (200 J x 1) 12/11/2015; e.) s/p ablation 04/28/2022; f.) rate/rhythm maintained on oral diltiazem + carvedilol; chronically anticoagulated with apixaban   Pilonidal cyst    Schizophrenia (HCC)    Sciatica neuralgia    T2DM (type 2 diabetes mellitus) (HCC)    Thoracic aortic ectasia (HCC) 01/13/2021   a.) TTE 01/13/2021: Ao root 38 mm, asc Ao 39 mm    Family History  Problem Relation Age of Onset   Heart disease Father    Schizophrenia Sister     Past Surgical History:  Procedure Laterality Date   ATRIAL FIBRILLATION ABLATION  09/22/2015   ATRIAL FIBRILLATION ABLATION N/A 04/28/2022   Procedure: ATRIAL FIBRILLATION ABLATION;  Surgeon: Regan Lemming, MD;  [facility];  Service: Cardiovascular;  Laterality: N/A;   CATARACT EXTRACTION Bilateral    CYST EXCISION  1996-97   surgery back of head    ELECTROPHYSIOLOGIC STUDY N/A 09/22/2015   Procedure: Atrial Fibrillation Ablation;  Surgeon: Will Jorja Loa, MD;  [facility];  Service: Cardiovascular;  Laterality: N/A;   ELECTROPHYSIOLOGIC STUDY N/A 12/10/2015   Procedure: Atrial Fibrillation Ablation;  Surgeon: Will Jorja Loa, MD;  [facility];  Service: Cardiovascular;  Laterality: N/A;   ELECTROPHYSIOLOGIC STUDY N/A 12/11/2015   Procedure: Cardioversion;  Surgeon: Will Jorja Loa, MD;  [facility];  Service: Cardiovascular;  Laterality: N/A;   EMBOLIZATION (CATH LAB) Right 08/19/2020   Procedure: EMBOLIZATION;  Surgeon: Leonie Douglas, MD;  [facility];  Service: Cardiovascular;  Laterality: Right;  hypogastric   EXCISION MASS HEAD N/A 01/06/2017    Procedure: EXCISION MASS FOREHEAD;  Surgeon: Glenna Fellows, MD;  Location: [facility];  Service: Plastics;  Laterality: N/A;   EXCISION MASS UPPER EXTREMETIES Right 08/08/2022   Procedure: EXCISION MASS RIGHT FOREARM;  Surgeon: Betha Loa, MD;  Location: [facility];  Service: Orthopedics;  Laterality: Right;  45 MIN   GANGLION CYST EXCISION Left    INTERCOSTAL NERVE BLOCK  07/19/2003   KNEE ARTHROSCOPY Right 07/18/2014   MASS EXCISION N/A 01/25/2021   Procedure: EXCISION SUBCUTANEOUS VS SUBFASCIAL MASS TORSO 3CM;  Surgeon: Glenna Fellows, MD;  Location: [facility];  Service: Plastics;  Laterality: N/A;   PILONIDAL CYST EXCISION N/A 09/13/2022   Procedure: CYST EXCISION PILONIDAL EXTENSIVE;  Surgeon: Henrene Dodge, MD;  Location: [facility];  Service: General;  Laterality: N/A;   SHOULDER ARTHROSCOPY WITH ROTATOR CUFF REPAIR AND OPEN BICEPS TENODESIS Left 07/26/2023  Procedure: LEFT SHOULDER ARTHROSCOPY WITH OPEN ROTATOR CUFF REPAIR AND OPEN BICEPS TENODESIS;  Surgeon: Eldred Manges, MD;  Location: [facility];  Service: Orthopedics;  Laterality: Left;   Social History   Occupational History    Comment: disabled  Tobacco Use   Smoking status: Some Days    Current packs/day: 0.00    Types: Cigarettes    Start date: 09/10/1993    Last attempt to quit: 09/10/2022    Years since quitting: 0.8    Passive exposure: Never   Smokeless tobacco: Never   Tobacco comments:    1 cigarettes every day  06-02-2021  Vaping Use   Vaping status: Never Used  Substance and Sexual Activity   Alcohol use: Yes    Alcohol/week: 6.0 standard drinks of alcohol    Types: 6 Cans of beer per week    Comment: occasional   Drug use: Yes    Types: Marijuana    Comment: been 1 week   Sexual activity: Not Currently

## 2023-08-10 ENCOUNTER — Other Ambulatory Visit: Payer: Self-pay | Admitting: Family Medicine

## 2023-08-10 DIAGNOSIS — M5416 Radiculopathy, lumbar region: Secondary | ICD-10-CM

## 2023-08-10 DIAGNOSIS — M542 Cervicalgia: Secondary | ICD-10-CM

## 2023-08-10 NOTE — Telephone Encounter (Signed)
Requested Prescriptions  Pending Prescriptions Disp Refills   DULoxetine (CYMBALTA) 60 MG capsule [Pharmacy Med Name: DULOXETINE HCL DR 60 MG CAP] 90 capsule 1    Sig: TAKE 1 CAPSULE (60 MG TOTAL) BY MOUTH DAILY. FOR CHRONIC PAIN AND NEUROPATHY     Psychiatry: Antidepressants - SNRI - duloxetine Passed - 08/10/2023 11:51 AM      Passed - Cr in normal range and within 360 days    Creat  Date Value Ref Range Status  11/30/2015 1.48 (H) 0.70 - 1.33 mg/dL Final   Creatinine, Ser  Date Value Ref Range Status  07/24/2023 1.11 0.61 - 1.24 mg/dL Final         Passed - eGFR is 30 or above and within 360 days    GFR, Est African American  Date Value Ref Range Status  10/04/2013 75 mL/min Final   GFR calc Af Amer  Date Value Ref Range Status  01/03/2020 79 >59 mL/min/1.73 Final    Comment:    **[facility] currently reports eGFR in compliance with the current**   recommendations of the [employer]. [facility] will   update reporting as new guidelines are published from the NKF-ASN   Task force.    GFR, Est Non African American  Date Value Ref Range Status  10/04/2013 65 mL/min Final    Comment:      The estimated GFR is a calculation valid for adults (>=26 years old) that uses the CKD-EPI algorithm to adjust for age and sex. It is   not to be used for children, pregnant women, hospitalized patients,    patients on dialysis, or with rapidly changing kidney function. According to the NKDEP, eGFR >89 is normal, 60-89 shows mild impairment, 30-59 shows moderate impairment, 15-29 shows severe impairment and <15 is ESRD.     GFR, Estimated  Date Value Ref Range Status  07/24/2023 >60 >60 mL/min Final    Comment:    (NOTE) Calculated using the CKD-EPI Creatinine Equation (2021)    GFR  Date Value Ref Range Status  03/27/2015 81.12 >60.00 mL/min Final   eGFR  Date Value Ref Range Status  05/24/2023 70 >59 mL/min/1.73 Final         Passed - Completed PHQ-2 or PHQ-9  in the last 360 days      Passed - Last BP in normal range    BP Readings from Last 1 Encounters:  08/01/23 134/80         Passed - Valid encounter within last 6 months    Recent Outpatient Visits           1 month ago Hypertension associated with diabetes (HCC)   [facility] - [facility] Dept Of [place] Hoy Register, MD   2 months ago Type 2 diabetes mellitus with other specified complication, without long-term current use of insulin (HCC)   [facility] - A Dept Of [place]. [facility] Hoy Register, MD   3 months ago Lumbar radiculopathy   [facility] - A Dept Of [place]. [facility] Hoy Register, MD   8 months ago Type 2 diabetes mellitus with other specified complication, without long-term current use of insulin (HCC)   [facility] - A Dept Of [place]. [facility] Hoy Register, MD   11 months ago Type 2 diabetes mellitus with other specified complication, without long-term current use of insulin (HCC)  [facility] - A Dept Of [place]. [facility] Hoy Register, MD       Future Appointments             In 4 months Hoy Register, MD [facility] - A Dept Of [place]. [facility]

## 2023-08-14 MED ORDER — DULERA 200-5 MCG/ACT IN AERO: 2.0000 | INHALATION_SPRAY | Freq: Two times a day (BID) | RESPIRATORY_TRACT | 1 refills | Status: DC

## 2023-08-14 NOTE — Telephone Encounter (Signed)
Called and left a message for patient to call  our office back to inform him of the medication change.

## 2023-08-14 NOTE — Addendum Note (Signed)
Addended by: Ralene Muskrat on: 08/14/2023 08:43 AM   Modules accepted: Orders

## 2023-08-17 ENCOUNTER — Other Ambulatory Visit: Payer: Self-pay | Admitting: Cardiology

## 2023-08-17 DIAGNOSIS — I480 Paroxysmal atrial fibrillation: Secondary | ICD-10-CM

## 2023-08-17 NOTE — Telephone Encounter (Signed)
Eliquis 5mg  refill request received. Patient is 63 years old, weight-111.1kg, Crea-1.11 on 07/24/23, Diagnosis-Afib, and last seen by Otilio Saber on 03/23/23. Dose is appropriate based on dosing criteria. Will send in refill to requested pharmacy.

## 2023-08-29 ENCOUNTER — Encounter: Payer: Medicaid Other | Admitting: Physical Medicine & Rehabilitation

## 2023-08-31 ENCOUNTER — Encounter: Payer: Self-pay | Admitting: Physical Medicine & Rehabilitation

## 2023-08-31 ENCOUNTER — Encounter: Payer: Medicaid Other | Attending: Physical Medicine & Rehabilitation | Admitting: Physical Medicine & Rehabilitation

## 2023-08-31 VITALS — BP 153/99 | HR 79 | Ht 73.0 in | Wt 249.0 lb

## 2023-08-31 DIAGNOSIS — M48062 Spinal stenosis, lumbar region with neurogenic claudication: Secondary | ICD-10-CM | POA: Insufficient documentation

## 2023-08-31 NOTE — Progress Notes (Signed)
Subjective:    Patient ID: Jason Stokes, male    DOB: 06-27-1961, 63 y.o.   MRN: [id number]  HPI 63 year old male with mild to moderate lumbar stenosis multilevel who has chronic low back pain radiating into both lower extremities mainly big toes.  He is actually doing quite well with his back issue at the current time really does not complain of pain there. Bilateral L4-5 ESI 12/17- still work occ muscle spasm worked better than L5-S1 Right side always worse than left  Pain increases with walking   Had Left shoulder surgery 07/26/2023, has been on Oxy IR 5mg  1-2 per day  Bilateral L5-S1 ESI 03/28/23  MRI LUMBAR SPINE WITHOUT CONTRAST   TECHNIQUE: Multiplanar, multisequence MR imaging of the lumbar spine was performed. No intravenous contrast was administered.   COMPARISON:  MRI 05/27/2020   FINDINGS: Segmentation:  Standard.   Alignment:  Physiologic.   Vertebrae: No fracture, evidence of discitis, or bone lesion. Mild diffuse intrinsic canal narrowing on the basis of congenitally short pedicles.   Conus medullaris and cauda equina: Conus extends to the T12-L1 level. Conus and cauda equina appear normal.   Paraspinal and other soft tissues: Negative.   Disc levels:   T12-L1: No significant disc protrusion, foraminal stenosis, or canal stenosis. Unchanged.   L1-L2: Minimal annular disc bulge. Mild facet hypertrophy. No foraminal or canal stenosis. Unchanged.   L2-L3: Mild annular disc bulge. Mild bilateral facet hypertrophy. Mild canal stenosis. No significant foraminal stenosis. Unchanged.   L3-L4: Annular disc bulge with mild facet hypertrophy. Mild prominence of the posterior epidural fat. Moderate canal stenosis with mild-to-moderate bilateral foraminal stenosis. Findings have progressed from prior.   L4-L5: Annular disc bulge with mild facet hypertrophy. Mild prominence of the posterior epidural fat. Mild bilateral subarticular recess stenosis and mild  canal stenosis. Mild-to-moderate bilateral foraminal stenosis. Minimal interval progression.   L5-S1: Mild disc bulge and bilateral facet arthropathy. No canal stenosis. Mild bilateral foraminal stenosis, more pronounced on the right. Unchanged.   IMPRESSION: 1. Mild multilevel degenerative changes of the lumbar spine superimposed on mild diffuse intrinsic canal narrowing on the basis of congenitally short pedicles. Findings have slightly progressed since 2021. 2. Moderate canal stenosis at L3-4 with mild-to-moderate bilateral foraminal stenosis. 3. Mild canal stenosis at L2-3 and L4-5. 4. Mild-to-moderate bilateral foraminal stenosis at L4-5. Mild bilateral foraminal stenosis at L5-S1.     Electronically Signed   By: Duanne Guess D.O.   On: 01/04/2023 16:47   Pain Inventory Average Pain 10 Pain Right Now 8 My pain is sharp, burning, dull, stabbing, tingling, and aching  In the last 24 hours, has pain interfered with the following? General activity 8 Relation with others 0 Enjoyment of life 8 What TIME of day is your pain at its worst? morning , daytime, evening, and night Sleep (in general) Poor  Pain is worse with: unsure Pain improves with: rest and medication Relief from Meds: 3  Family History  Problem Relation Age of Onset   Heart disease Father    Schizophrenia Sister    Social History   Socioeconomic History   Marital status: Single    Spouse name: [name]   Number of children: 3   Years of education: HS   Highest education level: 11th grade  Occupational History    Comment: disabled  Tobacco Use   Smoking status: Some Days    Current packs/day: 0.00    Types: Cigarettes    Start date: 09/10/1993  Last attempt to quit: 09/10/2022    Years since quitting: 0.9    Passive exposure: Never   Smokeless tobacco: Never   Tobacco comments:    1 cigarettes every day  06-02-2021  Vaping Use   Vaping status: Never Used  Substance and Sexual Activity    Alcohol use: Yes    Alcohol/week: 6.0 standard drinks of alcohol    Types: 6 Cans of beer per week    Comment: occasional   Drug use: Yes    Types: Marijuana    Comment: been 1 week   Sexual activity: Not Currently  Other Topics Concern   Not on file  Social History Narrative   Patient lives at home with [place].    Patient has 3 children 2 step.    Patient has 13 years of schooling.    Patient is right handed.    Social Drivers of Corporate investment banker Strain: Low Risk  (11/15/2022)   Overall Financial Resource Strain (CARDIA)    Difficulty of Paying Living Expenses: Not hard at all  Food Insecurity: Food Insecurity Present (11/15/2022)   Hunger Vital Sign    Worried About Running Out of Food in the Last Year: Never true    Ran Out of Food in the Last Year: Sometimes true  Transportation Needs: No Transportation Needs (11/15/2022)   PRAPARE - Administrator, Civil Service (Medical): No    Lack of Transportation (Non-Medical): No  Physical Activity: Unknown (11/15/2022)   Exercise Vital Sign    Days of Exercise per Week: 0 days    Minutes of Exercise per Session: Not on file  Stress: No Stress Concern Present (11/15/2022)   [employer] of Occupational Health - Occupational Stress Questionnaire    Feeling of Stress : Not at all  Social Connections: Socially Isolated (11/15/2022)   Social Connection and Isolation Panel [NHANES]    Frequency of Communication with Friends and Family: Twice a week    Frequency of Social Gatherings with Friends and Family: Once a week    Attends Religious Services: Never    Database administrator or Organizations: No    Attends Engineer, structural: Not on file    Marital Status: Never married   Past Surgical History:  Procedure Laterality Date   ATRIAL FIBRILLATION ABLATION  09/22/2015   ATRIAL FIBRILLATION ABLATION N/A 04/28/2022   Procedure: ATRIAL FIBRILLATION ABLATION;  Surgeon: Regan Lemming, MD;   [facility];  Service: Cardiovascular;  Laterality: N/A;   CATARACT EXTRACTION Bilateral    CYST EXCISION  1996-97   surgery back of head    ELECTROPHYSIOLOGIC STUDY N/A 09/22/2015   Procedure: Atrial Fibrillation Ablation;  Surgeon: Will Jorja Loa, MD;  [facility];  Service: Cardiovascular;  Laterality: N/A;   ELECTROPHYSIOLOGIC STUDY N/A 12/10/2015   Procedure: Atrial Fibrillation Ablation;  Surgeon: Will Jorja Loa, MD;  [facility];  Service: Cardiovascular;  Laterality: N/A;   ELECTROPHYSIOLOGIC STUDY N/A 12/11/2015   Procedure: Cardioversion;  Surgeon: Will Jorja Loa, MD;  [facility];  Service: Cardiovascular;  Laterality: N/A;   EMBOLIZATION (CATH LAB) Right 08/19/2020   Procedure: EMBOLIZATION;  Surgeon: Leonie Douglas, MD;  [facility];  Service: Cardiovascular;  Laterality: Right;  hypogastric   EXCISION MASS HEAD N/A 01/06/2017   Procedure: EXCISION MASS FOREHEAD;  Surgeon: Glenna Fellows, MD;  Location: [facility];  Service: Plastics;  Laterality: N/A;   EXCISION MASS UPPER EXTREMETIES Right 08/08/2022   Procedure: EXCISION MASS RIGHT FOREARM;  Surgeon: Betha Loa, MD;  Location: [facility];  Service: Orthopedics;  Laterality: Right;  45 MIN   GANGLION CYST EXCISION Left    INTERCOSTAL NERVE BLOCK  07/19/2003   KNEE ARTHROSCOPY Right 07/18/2014   MASS EXCISION N/A 01/25/2021   Procedure: EXCISION SUBCUTANEOUS VS SUBFASCIAL MASS TORSO 3CM;  Surgeon: Glenna Fellows, MD;  Location: [facility];  Service: Plastics;  Laterality: N/A;   PILONIDAL CYST EXCISION N/A 09/13/2022   Procedure: CYST EXCISION PILONIDAL EXTENSIVE;  Surgeon: Henrene Dodge, MD;  Location: [facility];  Service: General;  Laterality: N/A;   SHOULDER ARTHROSCOPY WITH ROTATOR CUFF REPAIR AND OPEN BICEPS TENODESIS Left 07/26/2023   Procedure: LEFT SHOULDER ARTHROSCOPY  WITH OPEN ROTATOR CUFF REPAIR AND OPEN BICEPS TENODESIS;  Surgeon: Eldred Manges, MD;  Location: [facility];  Service: Orthopedics;  Laterality: Left;   Past Surgical History:  Procedure Laterality Date   ATRIAL FIBRILLATION ABLATION  09/22/2015   ATRIAL FIBRILLATION ABLATION N/A 04/28/2022   Procedure: ATRIAL FIBRILLATION ABLATION;  Surgeon: Regan Lemming, MD;  [facility];  Service: Cardiovascular;  Laterality: N/A;   CATARACT EXTRACTION Bilateral    CYST EXCISION  1996-97   surgery back of head    ELECTROPHYSIOLOGIC STUDY N/A 09/22/2015   Procedure: Atrial Fibrillation Ablation;  Surgeon: Will Jorja Loa, MD;  [facility];  Service: Cardiovascular;  Laterality: N/A;   ELECTROPHYSIOLOGIC STUDY N/A 12/10/2015   Procedure: Atrial Fibrillation Ablation;  Surgeon: Will Jorja Loa, MD;  [facility];  Service: Cardiovascular;  Laterality: N/A;   ELECTROPHYSIOLOGIC STUDY N/A 12/11/2015   Procedure: Cardioversion;  Surgeon: Will Jorja Loa, MD;  [facility];  Service: Cardiovascular;  Laterality: N/A;   EMBOLIZATION (CATH LAB) Right 08/19/2020   Procedure: EMBOLIZATION;  Surgeon: Leonie Douglas, MD;  [facility];  Service: Cardiovascular;  Laterality: Right;  hypogastric   EXCISION MASS HEAD N/A 01/06/2017   Procedure: EXCISION MASS FOREHEAD;  Surgeon: Glenna Fellows, MD;  Location: [facility];  Service: Plastics;  Laterality: N/A;   EXCISION MASS UPPER EXTREMETIES Right 08/08/2022   Procedure: EXCISION MASS RIGHT FOREARM;  Surgeon: Betha Loa, MD;  Location: [facility];  Service: Orthopedics;  Laterality: Right;  45 MIN   GANGLION CYST EXCISION Left    INTERCOSTAL NERVE BLOCK  07/19/2003   KNEE ARTHROSCOPY Right 07/18/2014   MASS EXCISION N/A 01/25/2021   Procedure: EXCISION SUBCUTANEOUS VS SUBFASCIAL MASS TORSO 3CM;  Surgeon: Glenna Fellows, MD;  Location: [facility];  Service: Plastics;  Laterality: N/A;   PILONIDAL CYST EXCISION N/A 09/13/2022   Procedure: CYST EXCISION PILONIDAL EXTENSIVE;  Surgeon: Henrene Dodge, MD;  Location: [facility];  Service: General;  Laterality: N/A;   SHOULDER ARTHROSCOPY WITH ROTATOR CUFF REPAIR AND OPEN BICEPS TENODESIS Left 07/26/2023   Procedure: LEFT SHOULDER ARTHROSCOPY WITH OPEN ROTATOR CUFF REPAIR AND OPEN BICEPS TENODESIS;  Surgeon: Eldred Manges, MD;  Location: [facility];  Service: Orthopedics;  Laterality: Left;   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)    a.) s/p embolization 08/19/2020: 29 mm RIGHT internal iliac artery aneurysm   Anxiety    Aortic atherosclerosis (HCC)    Bilateral carpal tunnel syndrome 01/10/2018  Bipolar disorder (HCC)    Chorioretinal inflammation of both eyes    a.) on azothioprine   Chronic lower back pain    Coronary artery calcification seen on CT scan    a.) cCTA 04/21/2022: Ca score 20.6 (61st percentile for age/sex match control)   DDD (degenerative disc disease), cervical    Depression    Diastolic dysfunction    a.) TTE 01/13/2021: EF 60-65%, mod LVH, triv MR, G1DD   GERD (gastroesophageal reflux disease)    Hepatic steatosis    History of alcohol abuse    History of nuclear stress test    Myoview 10/16: EF 50%, diaphragmatic attenuation, no ischemia, low risk   Hypertension    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   a.) CT head 12/25/2012 --> RIGHT basal ganglia hypoattenuation related to remote lacunar infarct   Lipoma    Long term (current) use of anticoagulants    a.) apixaban   Long-term current use of immunomodulator    a.) on azothioprine for peripheral focal chorioretinal inflammation (both eyes)   Marijuana use    Migraine    Moderate persistent asthma with acute exacerbation 05/02/2018   PAF (paroxysmal atrial fibrillation) (HCC) 03/27/2015    a.) CHA2DS2VASc = 5 (HTN, CVA x2, vascular disease history, T2DM);  b.) s/p ablation 10/02/2015; c.) s/p ablation 12/10/2015; d.) s/p DCCV (200 J x 1) 12/11/2015; e.) s/p ablation 04/28/2022; f.) rate/rhythm maintained on oral diltiazem + carvedilol; chronically anticoagulated with apixaban   Pilonidal cyst    Schizophrenia (HCC)    Sciatica neuralgia    T2DM (type 2 diabetes mellitus) (HCC)    Thoracic aortic ectasia (HCC) 01/13/2021   a.) TTE 01/13/2021: Ao root 38 mm, asc Ao 39 mm   Ht 6\' 1"  (1.854 m)   BMI 32.32 kg/m   Opioid Risk Score:   Fall Risk Score:  `1  Depression screen [facility] 2/9     07/04/2023    9:58 AM 02/21/2023    2:41 PM 08/16/2022    2:54 PM 08/16/2022    2:53 PM 01/01/2019    9:08 AM 07/24/2018   11:40 AM 05/02/2018   10:29 AM  Depression screen PHQ 2/9  Decreased Interest 0 2 2 2  0 0 0  Down, Depressed, Hopeless 0 0 1 1 0 1 0  PHQ - 2 Score 0 2 3 3  0 1 0  Altered sleeping  1 3 3   0 2  Tired, decreased energy  2 1 1  1  0  Change in appetite  0 1 1  1  0  Feeling bad or failure about yourself   0 2 2  3  0  Trouble concentrating  0 0 0  0 0  Moving slowly or fidgety/restless  1 0 0  3 0  Suicidal thoughts  0 0 0  0 0  PHQ-9 Score  6 10 10  9 2       Review of Systems  Musculoskeletal:        Shoulder pain   All other systems reviewed and are negative.     Objective:   Physical Exam No tenderness to palpation lumbar paraspinal area Lumbar range of motion is normal Negative straight leg raise bilaterally Normal strength bilaterally Normal sensation bilateral L5 dermatome distribution Ambulates without assistive device no evidence of toe drag or knee instability       Assessment & Plan:  1.  Lumbar spinal stenosis with mild to moderate foraminal stenosis multilevel.  His symptomatology suggests L5 nerve  root compression he is doing well with this.  He did better with L4-L5 transforaminal epidural than at the L5-S1 level.  Thus far he has had  approximately 2 months relief.  We discussed that it is difficult to predict the duration of response.  The soonest we can repeat this in approximately 1 month but hopefully he will have prolonged pain relief. Please call to make appt for bilateral L4-5 Transforaminal epidural steroid injection once pain worsens in the low back radiating into the lower extremities  Will need to be off apixaban (Eliquis ) fo r3 days prior to procedure

## 2023-08-31 NOTE — Patient Instructions (Addendum)
Please call to make appt for bilateral L4-5 Transforaminal epidural steroid injection   Will need to be off apixaban (Eliquis ) for 3 days prior to procedure

## 2023-09-05 ENCOUNTER — Ambulatory Visit (INDEPENDENT_AMBULATORY_CARE_PROVIDER_SITE_OTHER): Payer: Self-pay | Admitting: *Deleted

## 2023-09-05 ENCOUNTER — Ambulatory Visit (INDEPENDENT_AMBULATORY_CARE_PROVIDER_SITE_OTHER): Payer: Medicaid Other | Admitting: Orthopaedic Surgery

## 2023-09-05 ENCOUNTER — Encounter: Payer: Self-pay | Admitting: Orthopaedic Surgery

## 2023-09-05 VITALS — BP 166/96 | HR 73 | Ht 73.0 in | Wt 249.0 lb

## 2023-09-05 DIAGNOSIS — J309 Allergic rhinitis, unspecified: Secondary | ICD-10-CM | POA: Diagnosis not present

## 2023-09-05 DIAGNOSIS — M75122 Complete rotator cuff tear or rupture of left shoulder, not specified as traumatic: Secondary | ICD-10-CM

## 2023-09-05 DIAGNOSIS — M7522 Bicipital tendinitis, left shoulder: Secondary | ICD-10-CM

## 2023-09-05 MED ORDER — OXYCODONE-ACETAMINOPHEN 5-325 MG PO TABS
1.0000 | ORAL_TABLET | Freq: Four times a day (QID) | ORAL | 0 refills | Status: DC | PRN
Start: 2023-09-05 — End: 2024-02-29

## 2023-09-05 NOTE — Progress Notes (Signed)
Post-Op Visit Note   [name]           Date of Birth: [birth year]           MRN: [id number] Visit Date: 09/05/2023 PCP: Hoy Register, MD   Assessment & Plan: Postop left rotator cuff repair and biceps tenodesis.  He has been doing his gentle circles with his arm hanging down and elephant sling.  He states he is ready to start physical therapy.  We have discussed no active abduction until he is 6 weeks postop this will start gently with therapy.  He does have diabetes on insulin.  States has been out of his pain medication for 1 week.  20 tablets prescribed Percocet 5/325.  Chief Complaint:  Chief Complaint  Patient presents with   Left Shoulder - Follow-up, Routine Post Op    07/26/2023 left shoulder arthroscopy, open RCR, open BT   Visit Diagnoses:  1. Complete tear of left rotator cuff, unspecified whether traumatic   2. Tendinitis of upper biceps tendon of left shoulder     Plan: Outpatient therapy [facility] facility is his preference.  Recheck 5 weeks.  Follow-Up Instructions: No follow-ups on file.   Orders:  Orders Placed This Encounter  Procedures   Ambulatory referral to Physical Therapy   Meds ordered this encounter  Medications   oxyCODONE-acetaminophen (PERCOCET) 5-325 MG tablet    Sig: Take 1-2 tablets by mouth every 6 (six) hours as needed for severe pain (pain score 7-10).    Dispense:  20 tablet    Refill:  0    Post op rotator cuff repair    Imaging: No results found.  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   Pilonidal cyst 09/13/2022   Aortic atherosclerosis (HCC) 06/07/2022   Hypercoagulable state due to paroxysmal atrial fibrillation (HCC) 03/23/2021   Type 2 diabetes mellitus with hyperglycemia, without long-term current use of insulin  (HCC) 02/10/2021   Neck pain on left side 03/06/2019   Musculoskeletal chest pain 07/24/2018   Asthma, mild intermittent 05/02/2018   Allergic rhinitis caused by mold 05/02/2018   Tobacco use 05/02/2018   Bilateral carpal tunnel syndrome 01/10/2018   Stroke (HCC)    Schizophrenia (HCC)    Migraine    History of nuclear stress test    History of alcohol abuse    GERD (gastroesophageal reflux disease)    Dysrhythmia    Chronic lower back pain    Anxiety    Arthritis of knee, right 04/12/2017   Bipolar disorder (HCC) 04/03/2017   Lipoma of forehead 09/22/2016   Trigger ring finger of right hand 06/22/2016   Paroxysmal atrial fibrillation (HCC)    AF (atrial fibrillation) (HCC) 12/10/2015   Eczema 07/10/2015   History of CVA (cerebrovascular accident) 05/04/2015   Tear of medial meniscus of right knee 03/18/2015   Chronic pain of right knee 03/12/2015   Other and unspecified hyperlipidemia 11/25/2013   Nasal congestion 11/25/2013   Sinusitis, chronic 05/09/2013   Chronic low back pain 10/12/2012   Lumbar radiculopathy 10/12/2012   Hypertension associated with diabetes (HCC) 10/12/2012   Depression 10/12/2012   Insomnia 10/12/2012   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)    a.) s/p  embolization 08/19/2020: 29 mm RIGHT internal iliac artery aneurysm   Anxiety    Aortic atherosclerosis (HCC)    Bilateral carpal tunnel syndrome 01/10/2018   Bipolar disorder (HCC)    Chorioretinal inflammation of both eyes    a.) on azothioprine   Chronic lower back pain    Coronary artery calcification seen on CT scan    a.) cCTA 04/21/2022: Ca score 20.6 (61st percentile for age/sex match control)   DDD (degenerative disc disease), cervical    Depression    Diastolic dysfunction    a.) TTE 01/13/2021: EF 60-65%, mod LVH, triv MR, G1DD   GERD (gastroesophageal reflux disease)    Hepatic steatosis    History of alcohol abuse     History of nuclear stress test    Myoview 10/16: EF 50%, diaphragmatic attenuation, no ischemia, low risk   Hypertension    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   a.) CT head 12/25/2012 --> RIGHT basal ganglia hypoattenuation related to remote lacunar infarct   Lipoma    Long term (current) use of anticoagulants    a.) apixaban   Long-term current use of immunomodulator    a.) on azothioprine for peripheral focal chorioretinal inflammation (both eyes)   Marijuana use    Migraine    Moderate persistent asthma with acute exacerbation 05/02/2018   PAF (paroxysmal atrial fibrillation) (HCC) 03/27/2015   a.) CHA2DS2VASc = 5 (HTN, CVA x2, vascular disease history, T2DM);  b.) s/p ablation 10/02/2015; c.) s/p ablation 12/10/2015; d.) s/p DCCV (200 J x 1) 12/11/2015; e.) s/p ablation 04/28/2022; f.) rate/rhythm maintained on oral diltiazem + carvedilol; chronically anticoagulated with apixaban   Pilonidal cyst    Schizophrenia (HCC)    Sciatica neuralgia    T2DM (type 2 diabetes mellitus) (HCC)    Thoracic aortic ectasia (HCC) 01/13/2021   a.) TTE 01/13/2021: Ao root 38 mm, asc Ao 39 mm    Family History  Problem Relation Age of Onset   Heart disease Father    Schizophrenia Sister     Past Surgical History:  Procedure Laterality Date   ATRIAL FIBRILLATION ABLATION  09/22/2015   ATRIAL FIBRILLATION ABLATION N/A 04/28/2022   Procedure: ATRIAL FIBRILLATION ABLATION;  Surgeon: Regan Lemming, MD;  [facility];  Service: Cardiovascular;  Laterality: N/A;   CATARACT EXTRACTION Bilateral    CYST EXCISION  1996-97   surgery back of head    ELECTROPHYSIOLOGIC STUDY N/A 09/22/2015   Procedure: Atrial Fibrillation Ablation;  Surgeon: Will Jorja Loa, MD;  [facility];  Service: Cardiovascular;  Laterality: N/A;   ELECTROPHYSIOLOGIC STUDY N/A 12/10/2015   Procedure: Atrial Fibrillation Ablation;  Surgeon: Will Jorja Loa, MD;  [facility];  Service: Cardiovascular;  Laterality: N/A;   ELECTROPHYSIOLOGIC STUDY N/A 12/11/2015   Procedure: Cardioversion;  Surgeon: Will Jorja Loa, MD;  [facility];  Service: Cardiovascular;  Laterality: N/A;   EMBOLIZATION (CATH LAB) Right 08/19/2020   Procedure: EMBOLIZATION;  Surgeon: Leonie Douglas, MD;  [facility];  Service: Cardiovascular;  Laterality: Right;  hypogastric   EXCISION MASS HEAD N/A 01/06/2017   Procedure: EXCISION MASS FOREHEAD;  Surgeon: Glenna Fellows, MD;  Location: [facility];  Service: Plastics;  Laterality: N/A;   EXCISION MASS UPPER EXTREMETIES Right 08/08/2022   Procedure: EXCISION MASS RIGHT FOREARM;  Surgeon: Betha Loa, MD;  Location: [facility];  Service: Orthopedics;  Laterality: Right;  45 MIN  GANGLION CYST EXCISION Left    INTERCOSTAL NERVE BLOCK  07/19/2003   KNEE ARTHROSCOPY Right 07/18/2014   MASS EXCISION N/A 01/25/2021   Procedure: EXCISION SUBCUTANEOUS VS SUBFASCIAL MASS TORSO 3CM;  Surgeon: Glenna Fellows, MD;  Location: [facility];  Service: Plastics;  Laterality: N/A;   PILONIDAL CYST EXCISION N/A 09/13/2022   Procedure: CYST EXCISION PILONIDAL EXTENSIVE;  Surgeon: Henrene Dodge, MD;  Location: [facility];  Service: General;  Laterality: N/A;   SHOULDER ARTHROSCOPY WITH ROTATOR CUFF REPAIR AND OPEN BICEPS TENODESIS Left 07/26/2023   Procedure: LEFT SHOULDER ARTHROSCOPY WITH OPEN ROTATOR CUFF REPAIR AND OPEN BICEPS TENODESIS;  Surgeon: Eldred Manges, MD;  Location: [facility];  Service: Orthopedics;  Laterality: Left;   Social History   Occupational History    Comment: disabled  Tobacco Use   Smoking status: Some Days    Current packs/day: 0.00    Types: Cigarettes    Start date: 09/10/1993    Last attempt to quit: 09/10/2022    Years since quitting: 0.9    Passive exposure: Never   Smokeless tobacco: Never   Tobacco comments:    1  cigarettes every day  06-02-2021  Vaping Use   Vaping status: Never Used  Substance and Sexual Activity   Alcohol use: Yes    Alcohol/week: 6.0 standard drinks of alcohol    Types: 6 Cans of beer per week    Comment: occasional   Drug use: Yes    Types: Marijuana    Comment: been 1 week   Sexual activity: Not Currently

## 2023-09-06 ENCOUNTER — Other Ambulatory Visit: Payer: Self-pay | Admitting: Family Medicine

## 2023-09-06 ENCOUNTER — Other Ambulatory Visit: Payer: Self-pay | Admitting: Allergy and Immunology

## 2023-09-06 DIAGNOSIS — R600 Localized edema: Secondary | ICD-10-CM

## 2023-09-07 ENCOUNTER — Telehealth: Payer: Self-pay

## 2023-09-07 ENCOUNTER — Telehealth: Payer: Self-pay | Admitting: Radiology

## 2023-09-07 NOTE — Telephone Encounter (Signed)
Patient called on 09/06/2023 stating prior auth needed to be completed for Oxycodone. I had not received fax from pharmacy, however, started prior auth in Cover My Meds. Received fax today that pharmacy is unable to dispense medication through insurance as patient is locked in with another provider. This same thing happened after initial post op pain medication prescription. Patient had to call insurance to have them remove lock. I received notice then that prior auth was not needed.   I called pharmacy today. Jason Stokes is still on. Patient will either have to pay out of pocket and use discount card for medication or call insurance company and have lock removed. I called patient and advised.

## 2023-09-07 NOTE — Telephone Encounter (Signed)
Copied from CRM [id number]. Topic: General - Other >> Sep 07, 2023  9:20 AM Prudencio Pair wrote: Reason for CRM: Janalyn Harder, with [employer], called stating that they requested a 3051 form and the form was sent back to them dated for October 2024. She states they are in need of a 3051 form from the time the patient had his last office visit, which looks like in December 2024. She states they need the office visits. Please fax or email them to her. Fax #: [phone] or email her at [email or web address]

## 2023-09-08 NOTE — Therapy (Signed)
 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)    a.) s/p embolization 08/19/2020: 29 mm RIGHT internal iliac artery aneurysm   Anxiety    Aortic atherosclerosis (HCC)    Bilateral carpal tunnel syndrome 01/10/2018   Bipolar disorder (HCC)    Chorioretinal inflammation of both eyes    a.) on azothioprine   Chronic lower back pain    Coronary artery calcification seen on CT scan    a.) cCTA 04/21/2022: Ca score 20.6 (61st percentile for age/sex match control)   DDD (degenerative disc disease), cervical    Depression    Diastolic dysfunction    a.) TTE 01/13/2021: EF 60-65%, mod LVH, triv MR, G1DD   GERD (gastroesophageal reflux disease)    Hepatic steatosis    History of alcohol abuse    History of nuclear stress test    Myoview 10/16: EF 50%, diaphragmatic attenuation, no ischemia, low risk   Hypertension    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   a.) CT head 12/25/2012 --> RIGHT basal ganglia hypoattenuation related to remote lacunar infarct   Lipoma    Long term (current) use of anticoagulants    a.) apixaban   Long-term current use of immunomodulator    a.) on azothioprine for peripheral focal chorioretinal inflammation (both eyes)   Marijuana use    Migraine    Moderate persistent asthma with acute exacerbation 05/02/2018   PAF (paroxysmal atrial fibrillation) (HCC) 03/27/2015   a.) CHA2DS2VASc = 5  (HTN, CVA x2, vascular disease history, T2DM);  b.) s/p ablation 10/02/2015; c.) s/p ablation 12/10/2015; d.) s/p DCCV (200 J x 1) 12/11/2015; e.) s/p ablation 04/28/2022; f.) rate/rhythm maintained on oral diltiazem + carvedilol; chronically anticoagulated with apixaban   Pilonidal cyst    Schizophrenia (HCC)    Sciatica neuralgia    T2DM (type 2 diabetes mellitus) (HCC)    Thoracic aortic ectasia (HCC) 01/13/2021   a.) TTE 01/13/2021: Ao root 38 mm, asc Ao 39 mm   Past Surgical History:  Procedure Laterality Date   ATRIAL FIBRILLATION ABLATION  09/22/2015   ATRIAL FIBRILLATION ABLATION N/A 04/28/2022   Procedure: ATRIAL FIBRILLATION ABLATION;  Surgeon: Regan Lemming, MD;  Location: [facility];  Service: Cardiovascular;  Laterality: N/A;   CATARACT EXTRACTION Bilateral    CYST EXCISION  1996-97   surgery back of head    ELECTROPHYSIOLOGIC STUDY N/A 09/22/2015   Procedure: Atrial Fibrillation Ablation;  Surgeon: Will Jorja Loa, MD;  Location: [facility];  Service: Cardiovascular;  Laterality: N/A;   ELECTROPHYSIOLOGIC STUDY N/A 12/10/2015   Procedure: Atrial Fibrillation Ablation;  Surgeon: Will Jorja Loa, MD;  Location: [facility];  Service: Cardiovascular;  Laterality: N/A;   ELECTROPHYSIOLOGIC STUDY N/A 12/11/2015   Procedure: Cardioversion;  Surgeon: Will Jorja Loa, MD;  Location: [facility];  Service: Cardiovascular;  Laterality: N/A;   EMBOLIZATION (CATH LAB) Right 08/19/2020   Procedure: EMBOLIZATION;  Surgeon: Leonie Douglas, MD;  Location: [facility];  Service: Cardiovascular;  Laterality: Right;  hypogastric   EXCISION MASS HEAD N/A 01/06/2017   Procedure: EXCISION MASS FOREHEAD;  Surgeon: Glenna Fellows, MD;  Location: [facility];  Service: Plastics;  Laterality: N/A;   EXCISION MASS UPPER EXTREMETIES Right 08/08/2022   Procedure: EXCISION MASS RIGHT FOREARM;  Surgeon: Betha Loa, MD;  Location:  [facility];  Service: Orthopedics;  Laterality: Right;  45 MIN   GANGLION CYST EXCISION Left    INTERCOSTAL NERVE BLOCK  07/19/2003   KNEE ARTHROSCOPY Right 07/18/2014   MASS EXCISION N/A 01/25/2021   Procedure: EXCISION SUBCUTANEOUS VS SUBFASCIAL MASS TORSO 3CM;  Surgeon: Glenna Fellows, MD;  Location: [facility];  Service: Plastics;  Laterality: N/A;   PILONIDAL CYST EXCISION N/A 09/13/2022   Procedure: CYST EXCISION PILONIDAL EXTENSIVE;  Surgeon: Henrene Dodge, MD;  Location: [facility];  Service: General;  Laterality: N/A;   SHOULDER ARTHROSCOPY WITH ROTATOR CUFF REPAIR AND OPEN BICEPS TENODESIS Left 07/26/2023   Procedure: LEFT SHOULDER ARTHROSCOPY WITH OPEN ROTATOR CUFF REPAIR AND OPEN BICEPS TENODESIS;  Surgeon: Eldred Manges, MD;  Location: [facility];  Service: Orthopedics;  Laterality: Left;   Patient Active Problem List   Diagnosis Date Noted   Tendinitis of upper biceps tendon of left shoulder 06/12/2023   Complete tear of left rotator cuff 10/28/2022   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   Pilonidal cyst 09/13/2022   Aortic atherosclerosis (HCC) 06/07/2022   Hypercoagulable state due to paroxysmal atrial fibrillation (HCC) 03/23/2021   Type 2 diabetes mellitus with hyperglycemia, without long-term current use of insulin (HCC) 02/10/2021   Neck pain on left side 03/06/2019   Musculoskeletal chest pain 07/24/2018   Asthma, mild intermittent 05/02/2018   Allergic rhinitis caused by mold 05/02/2018   Tobacco use 05/02/2018   Bilateral carpal tunnel syndrome 01/10/2018   Stroke (HCC)    Schizophrenia (HCC)    Migraine    History of nuclear stress test    History of alcohol abuse    GERD (gastroesophageal reflux disease)    Dysrhythmia    Chronic lower back pain    Anxiety    Arthritis of knee, right 04/12/2017   Bipolar disorder (HCC) 04/03/2017   Lipoma of forehead 09/22/2016   Trigger ring finger of right hand  06/22/2016   Paroxysmal atrial fibrillation (HCC)    AF (atrial fibrillation) (HCC) 12/10/2015   Eczema 07/10/2015   History of CVA (cerebrovascular accident) 05/04/2015   Tear of medial meniscus of right knee 03/18/2015   Chronic pain of right knee 03/12/2015   Other and unspecified hyperlipidemia 11/25/2013   Nasal congestion 11/25/2013   Sinusitis, chronic 05/09/2013   Chronic low back pain 10/12/2012   Lumbar radiculopathy 10/12/2012   Hypertension associated with diabetes (HCC) 10/12/2012   Depression 10/12/2012   Insomnia 10/12/2012    PCP: Hoy Register MD   REFERRING PROVIDER: Annell Greening MD   REFERRING DIAG: [phone] (ICD-10-CM) - Complete tear of left rotator cuff, unspecified whether traumatic  THERAPY DIAG:  No diagnosis found.  Rationale for Evaluation and Treatment: Rehabilitation  ONSET DATE: 07/26/23  SUBJECTIVE:  SUBJECTIVE STATEMENT: Pt with recent surgery for L RCT.  His shoulder pain is well controlled but he does say it locks up.  He does his pendulum exercises. He wears his sling on him all the time in case the pain increases. Patient is unable to use his Lt UE for any type of functional mobility.  He needs to be able to have full function of his shoulder end of May for a long drive.             PERTINENT HISTORY: HTN CVA DM Atrial fibrillation  PAIN:  Are you having pain? Yes: NPRS scale: 9/10 but patient has a "high tolerance for pain"  Pain location: deep in the shoulder joint  Pain description: sore achy and sharp Aggravating factors: holding it up, using it  Relieving factors: hot shower, massager, meds tried not to take it too much   PRECAUTIONS: Other: none   RED FLAGS: None   WEIGHT BEARING RESTRICTIONS: No  FALLS:  Has patient fallen in last 6 months?  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   PLOF: Independent with household mobility without device, Independent with community mobility without device, Needs assistance with ADLs, and Leisure: has a CNA in [place]  PATIENT GOALS:Patient wants to get ready for the highway.  Get back to fishing   NEXT MD VISIT:   OBJECTIVE:  Note: Objective measures were complet ed at Evaluation unless otherwise noted.  DIAGNOSTIC FINDINGS:  XR 05/2023  PATIENT SURVEYS:  Quick Dash 89  COGNITION: Overall cognitive status: Within functional limits for tasks assessed     SENSATION: L biceps numb and tinging to fingers at times.  He reports this in the L UE as well.   POSTURE: Leans head to the L , shoulders elevated, shoulders rounded   UPPER EXTREMITY ROM:   Passive ROM Right eval Left eval  Shoulder flexion  120  Shoulder extension    Shoulder abduction  85  Shoulder adduction    Shoulder internal rotation  55  Shoulder external rotation  45  Elbow flexion  full   Elbow extension  full   UPPER EXTREMITY MMT: NT due to post surgical. Appears to be 3-/5  MMT Right eval Left eval  Shoulder flexion    Shoulder extension    Shoulder abduction    Shoulder adduction    Shoulder internal rotation    Shoulder external rotation    Middle trapezius    Lower trapezius    Elbow flexion    Elbow extension    Wrist flexion    Wrist extension    Wrist ulnar deviation    Wrist radial deviation    Wrist pronation    Wrist supination    Grip strength (lbs)    (Blank rows = not tested)  SHOULDER SPECIAL TESTS: NT  JOINT MOBILITY TESTING:  Stiffness   PALPATION:  In TTP along Lt posterior and lateral shoulder  TREATMENT DATE:  [facility] Adult PT Treatment:                                                DATE: 09/11/23 Therapeutic Activity: AAROM chest press  AAROM overhead  AAROM ER  Scapular retraction  Self Care: Pt education see below    PATIENT EDUCATION: Education details: ice or heat, HEP, need to avoid overuse of L UE especially with biceps  Person educated: Patient Education method: Explanation, Demonstration, Verbal cues, and Handouts Education comprehension: verbalized understanding and needs further education  HOME EXERCISE PROGRAM: Access Code: [email or web address] URL: [email or web address] Date: 09/11/2023 Prepared by: Karie Mainland  Exercises - Supine Shoulder Press AAROM in Abduction with Dowel  - 1-3 x daily - 7 x weekly - 2 sets - 10 reps - 5 hold - Supine Shoulder Flexion Extension AAROM with Dowel  - 1-3 x daily - 7 x weekly - 2 sets - 10 reps - 5 hold - Supine Shoulder External Rotation with Dowel  - 1-3 x daily - 7 x weekly - 2 sets - 10 reps - 5 hold - Seated Scapular Retraction  - 1-3 x daily - 7 x weekly - 2 sets - 10 reps - 5 hold  ASSESSMENT:  CLINICAL IMPRESSION: Patient is a 63 y.o. male who was seen today for physical therapy evaluation and treatment for L shoulder surgery. He reports he has been working on moving his shoulder at home.  He is 6 weeks out from surgery.  He will transition to light strengthening but will ensure he is following MD protocol.   OBJECTIVE IMPAIRMENTS: cardiopulmonary status limiting activity, decreased mobility, decreased ROM, decreased strength, hypomobility, increased fascial restrictions, impaired flexibility, impaired sensation, impaired UE functional use, improper body mechanics, postural dysfunction, obesity, and pain.   ACTIVITY LIMITATIONS: carrying, lifting, sitting, standing, sleeping, stairs, bed mobility, bathing, dressing, reach over head,  hygiene/grooming, locomotion level, and caring for others  PARTICIPATION LIMITATIONS: meal prep, cleaning, interpersonal relationship, driving, shopping, community activity, yard work, and recreation  PERSONAL FACTORS: Education, Past/current experiences, Social background, and 3+ comorbidities: HTN, CVA , DM   are also affecting patient's functional outcome.   REHAB POTENTIAL: Excellent  CLINICAL DECISION MAKING: Evolving/moderate complexity  EVALUATION COMPLEXITY: Moderate   GOALS: Goals reviewed with patient? Yes  SHORT TERM GOALS: Target date: 10/09/2023    Pt will be I with HEP for shoulder A/AROM, strength  Baseline:given on eval  Goal status: INITIAL  2.  Pt will be able to use L arm for dressing and bathing with min difficulty.  Baseline: needs A  Goal status: INITIAL  3.  Pt will note pain at rest, no more than min for recreation, meals Baseline: min to mod pain  Goal status: INITIAL  4.  Pt will be able to use pillows/ice/heat appropriately for pain mgmt at home.  Baseline: needs reinforcement  Goal status: INITIAL   LONG TERM GOALS: Target date: 11/06/2023    Pt will be I with HEP for final HEP upon discharge Baseline: unknown  Goal status: INITIAL  2.  Pt will be able to carry 1-3 light items in his L UE down by his side (groceries) without increased pain  Baseline: unable Goal status: INITIAL  3.  Pt will be able to improve ability to sleep/rest with only min disturbance from sleep.  Baseline: severely limited  Goal status: INITIAL  4.  Pt will be able to report no sensory symptoms in LUE  Baseline: moderate N/T in L UE  Goal status: INITIAL  5.  LEFS will improve to 75% or better  Baseline: 89% Goal status: INITIAL   PLAN:  PT FREQUENCY: 2x/week  PT DURATION: 8 weeks  PLANNED INTERVENTIONS: 97164- PT Re-evaluation, 97110-Therapeutic exercises, 97530- Therapeutic activity, 97112- Neuromuscular re-education, 97535- Self Care, 40981- Manual  therapy, Patient/Family education, Taping, Dry Needling, Joint mobilization, Cryotherapy, and Moist heat  PLAN FOR NEXT SESSION: AAROM , OK to begin light strengthening /isometrics    Jazzma Neidhardt, PT 09/11/2023, 8:04 AM   Karie Mainland, PT 09/11/23 12:12 PM Phone: [phone] Fax: [phone]

## 2023-09-11 ENCOUNTER — Ambulatory Visit: Payer: Medicaid Other | Attending: Orthopaedic Surgery | Admitting: Physical Therapy

## 2023-09-11 DIAGNOSIS — M75122 Complete rotator cuff tear or rupture of left shoulder, not specified as traumatic: Secondary | ICD-10-CM | POA: Insufficient documentation

## 2023-09-11 DIAGNOSIS — M25612 Stiffness of left shoulder, not elsewhere classified: Secondary | ICD-10-CM | POA: Diagnosis present

## 2023-09-11 DIAGNOSIS — Z9889 Other specified postprocedural states: Secondary | ICD-10-CM | POA: Diagnosis present

## 2023-09-11 DIAGNOSIS — M25512 Pain in left shoulder: Secondary | ICD-10-CM | POA: Insufficient documentation

## 2023-09-11 NOTE — Telephone Encounter (Signed)
 [id number] faxed and confirmation received.

## 2023-09-11 NOTE — Telephone Encounter (Signed)
 [id number] Form completed given to PCP for signature.

## 2023-09-14 ENCOUNTER — Ambulatory Visit (INDEPENDENT_AMBULATORY_CARE_PROVIDER_SITE_OTHER): Payer: Self-pay | Admitting: *Deleted

## 2023-09-14 DIAGNOSIS — J309 Allergic rhinitis, unspecified: Secondary | ICD-10-CM | POA: Diagnosis not present

## 2023-09-18 ENCOUNTER — Ambulatory Visit: Payer: Medicaid Other | Attending: Orthopaedic Surgery | Admitting: Physical Therapy

## 2023-09-18 DIAGNOSIS — M25612 Stiffness of left shoulder, not elsewhere classified: Secondary | ICD-10-CM | POA: Diagnosis present

## 2023-09-18 DIAGNOSIS — Z9889 Other specified postprocedural states: Secondary | ICD-10-CM | POA: Insufficient documentation

## 2023-09-18 DIAGNOSIS — M25512 Pain in left shoulder: Secondary | ICD-10-CM | POA: Insufficient documentation

## 2023-09-18 DIAGNOSIS — M6281 Muscle weakness (generalized): Secondary | ICD-10-CM | POA: Insufficient documentation

## 2023-09-18 NOTE — Therapy (Signed)
 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)    a.) s/p embolization 08/19/2020: 29 mm RIGHT internal iliac artery aneurysm   Anxiety    Aortic atherosclerosis (HCC)    Bilateral carpal tunnel syndrome 01/10/2018   Bipolar disorder (HCC)    Chorioretinal inflammation of both eyes    a.) on azothioprine   Chronic lower back pain    Coronary artery calcification seen on CT scan    a.) cCTA 04/21/2022: Ca score 20.6 (61st percentile for age/sex match control)   DDD (degenerative disc disease), cervical    Depression    Diastolic dysfunction    a.) TTE 01/13/2021: EF 60-65%, mod LVH, triv MR, G1DD   GERD (gastroesophageal reflux disease)    Hepatic steatosis    History of alcohol abuse    History of nuclear stress test    Myoview 10/16: EF 50%, diaphragmatic attenuation, no ischemia, low risk   Hypertension    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   a.) CT head 12/25/2012 --> RIGHT basal ganglia hypoattenuation related to remote lacunar infarct   Lipoma    Long term (current) use of anticoagulants    a.) apixaban   Long-term current use of immunomodulator    a.) on azothioprine for peripheral focal chorioretinal inflammation (both eyes)   Marijuana use    Migraine    Moderate persistent asthma with acute exacerbation 05/02/2018   PAF (paroxysmal atrial fibrillation) (HCC) 03/27/2015   a.) CHA2DS2VASc = 5 (HTN, CVA x2, vascular disease history, T2DM);  b.) s/p ablation  10/02/2015; c.) s/p ablation 12/10/2015; d.) s/p DCCV (200 J x 1) 12/11/2015; e.) s/p ablation 04/28/2022; f.) rate/rhythm maintained on oral diltiazem + carvedilol; chronically anticoagulated with apixaban   Pilonidal cyst    Schizophrenia (HCC)    Sciatica neuralgia    T2DM (type 2 diabetes mellitus) (HCC)    Thoracic aortic ectasia (HCC) 01/13/2021   a.) TTE 01/13/2021: Ao root 38 mm, asc Ao 39 mm   Past Surgical History:  Procedure Laterality Date   ATRIAL FIBRILLATION ABLATION  09/22/2015   ATRIAL FIBRILLATION ABLATION N/A 04/28/2022   Procedure: ATRIAL FIBRILLATION ABLATION;  Surgeon: Regan Lemming, MD;  Location: [facility];  Service: Cardiovascular;  Laterality: N/A;   CATARACT EXTRACTION Bilateral    CYST EXCISION  1996-97   surgery back of head    ELECTROPHYSIOLOGIC STUDY N/A 09/22/2015   Procedure: Atrial Fibrillation Ablation;  Surgeon: Will Jorja Loa, MD;  Location: [facility];  Service: Cardiovascular;  Laterality: N/A;   ELECTROPHYSIOLOGIC STUDY N/A 12/10/2015   Procedure: Atrial Fibrillation Ablation;  Surgeon: Will Jorja Loa, MD;  Location: [facility];  Service: Cardiovascular;  Laterality: N/A;   ELECTROPHYSIOLOGIC STUDY N/A 12/11/2015   Procedure: Cardioversion;  Surgeon: Will Jorja Loa, MD;  Location:  [facility];  Service: Cardiovascular;  Laterality: N/A;   EMBOLIZATION (CATH LAB) Right 08/19/2020   Procedure: EMBOLIZATION;  Surgeon: Leonie Douglas, MD;  Location: [facility];  Service: Cardiovascular;  Laterality: Right;  hypogastric   EXCISION MASS HEAD N/A 01/06/2017   Procedure: EXCISION MASS FOREHEAD;  Surgeon: Glenna Fellows, MD;  Location: [facility];  Service: Plastics;  Laterality: N/A;   EXCISION MASS UPPER EXTREMETIES Right 08/08/2022   Procedure: EXCISION MASS RIGHT FOREARM;  Surgeon: Betha Loa, MD;  Location: [facility];  Service: Orthopedics;  Laterality:  Right;  45 MIN   GANGLION CYST EXCISION Left    INTERCOSTAL NERVE BLOCK  07/19/2003   KNEE ARTHROSCOPY Right 07/18/2014   MASS EXCISION N/A 01/25/2021   Procedure: EXCISION SUBCUTANEOUS VS SUBFASCIAL MASS TORSO 3CM;  Surgeon: Glenna Fellows, MD;  Location: [facility];  Service: Plastics;  Laterality: N/A;   PILONIDAL CYST EXCISION N/A 09/13/2022   Procedure: CYST EXCISION PILONIDAL EXTENSIVE;  Surgeon: Henrene Dodge, MD;  Location: [facility];  Service: General;  Laterality: N/A;   SHOULDER ARTHROSCOPY WITH ROTATOR CUFF REPAIR AND OPEN BICEPS TENODESIS Left 07/26/2023   Procedure: LEFT SHOULDER ARTHROSCOPY WITH OPEN ROTATOR CUFF REPAIR AND OPEN BICEPS TENODESIS;  Surgeon: Eldred Manges, MD;  Location: [facility];  Service: Orthopedics;  Laterality: Left;   Patient Active Problem List   Diagnosis Date Noted   Tendinitis of upper biceps tendon of left shoulder 06/12/2023   Complete tear of left rotator cuff 10/28/2022   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   Pilonidal cyst 09/13/2022   Aortic atherosclerosis (HCC) 06/07/2022   Hypercoagulable state due to paroxysmal atrial fibrillation (HCC) 03/23/2021   Type 2 diabetes mellitus with hyperglycemia, without long-term current use of insulin (HCC) 02/10/2021   Neck pain on left side 03/06/2019   Musculoskeletal chest pain 07/24/2018   Asthma, mild intermittent 05/02/2018   Allergic rhinitis caused by mold 05/02/2018   Tobacco use 05/02/2018   Bilateral carpal tunnel syndrome 01/10/2018   Stroke (HCC)    Schizophrenia (HCC)    Migraine    History of nuclear stress test    History of alcohol abuse    GERD (gastroesophageal reflux disease)    Dysrhythmia    Chronic lower back pain    Anxiety    Arthritis of knee, right 04/12/2017   Bipolar disorder (HCC) 04/03/2017   Lipoma of forehead 09/22/2016   Trigger ring finger of right hand 06/22/2016   Paroxysmal atrial fibrillation (HCC)    AF (atrial  fibrillation) (HCC) 12/10/2015   Eczema 07/10/2015   History of CVA (cerebrovascular accident) 05/04/2015   Tear of medial meniscus of right knee 03/18/2015   Chronic pain of right knee 03/12/2015   Other and unspecified hyperlipidemia 11/25/2013   Nasal congestion 11/25/2013   Sinusitis, chronic 05/09/2013   Chronic low back pain 10/12/2012   Lumbar radiculopathy 10/12/2012   Hypertension associated with diabetes (HCC) 10/12/2012   Depression 10/12/2012   Insomnia 10/12/2012    PCP: Hoy Register MD   REFERRING PROVIDER: Annell Greening MD   REFERRING DIAG: [phone] (ICD-10-CM) - Complete tear of left rotator cuff, unspecified whether traumatic  THERAPY DIAG:  Acute pain of left shoulder  Status post left rotator cuff repair  Stiffness of left shoulder, not elsewhere classified  Muscle weakness (generalized)  Rationale for Evaluation and Treatment: Rehabilitation  ONSET DATE: 07/26/23  SUBJECTIVE:  SUBJECTIVE STATEMENT: Pain is 7/10.  I do a little stretching when I wake up.     Pt with recent surgery for L RCT.  His shoulder pain is well controlled but he does say it locks up.  He does his pendulum exercises. He wears his sling on him all the time in case the pain increases. Patient is unable to use his Lt UE for any type of functional mobility.  He needs to be able to have full function of his shoulder end of May for a long drive.             PERTINENT HISTORY: HTN CVA DM Atrial fibrillation  PAIN:  Are you having pain? Yes: NPRS scale: 7/10 but patient has a "high tolerance for pain"  Pain location: deep in the shoulder joint  Pain description: sore achy and sharp Aggravating factors: holding it up, using it  Relieving factors: hot shower, massager, meds tried not to take it too much    PRECAUTIONS: Other: none   RED FLAGS: None   WEIGHT BEARING RESTRICTIONS: No  FALLS:  Has patient fallen in last 6 months? 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   PLOF: Independent with household mobility without device, Independent with community mobility without device, Needs assistance with ADLs, and Leisure: has a CNA in [place]  PATIENT GOALS:Patient wants to get ready for the highway.  Get back to fishing   NEXT MD VISIT:   OBJECTIVE:  Note: Objective measures were complet ed at Evaluation unless otherwise noted.  DIAGNOSTIC FINDINGS:  XR 05/2023  PATIENT SURVEYS:  Quick Dash 89  COGNITION: Overall cognitive status: Within functional limits for tasks assessed     SENSATION: L biceps numb and tinging to fingers at times.  He reports this in the L UE as well.   POSTURE: Leans head to the L , shoulders elevated, shoulders rounded   UPPER EXTREMITY ROM:   Passive ROM Right eval Left eval  Shoulder flexion  120  Shoulder extension    Shoulder abduction  85  Shoulder adduction    Shoulder internal rotation  55  Shoulder external rotation  45  Elbow flexion  full   Elbow extension  full   UPPER EXTREMITY MMT: NT due to post surgical. Appears to be 3-/5  MMT Right eval Left eval  Shoulder flexion    Shoulder extension    Shoulder abduction    Shoulder adduction    Shoulder internal rotation    Shoulder external rotation    Middle trapezius    Lower trapezius    Elbow flexion    Elbow extension    Wrist flexion    Wrist extension    Wrist ulnar deviation    Wrist radial deviation    Wrist pronation    Wrist supination    Grip strength (lbs)    (Blank rows = not tested)  SHOULDER SPECIAL TESTS: NT  JOINT  MOBILITY TESTING:  Stiffness   PALPATION:  In TTP along Lt posterior and lateral shoulder  TREATMENT DATE:   [facility] Adult PT Treatment:                                                DATE: 09/18/23 Manual Therapy: PROM all planes Gentle isometrics flexion, ER and IR x 5 sec  x 10   Therapeutic Activity: AAROM chest press  AAROM overhead  AAROM ER  Pulleys overhead and scaption, horizontal abduction in standing and sitting  Standing double arm press physio ball on high mat table Ball roll out with light pressing into ball (L stretch) forward and then lateral motion Green band (GTB) shrugs x 15 GTB row and extension x 10  Modalities: MHP 10 min L UE supine   [facility] Adult PT Treatment:                                                DATE: 09/11/23 Therapeutic Activity: AAROM chest press  AAROM overhead  AAROM ER  Scapular retraction  Self Care: Pt education see below    PATIENT EDUCATION: Education details: ice or heat, HEP, need to avoid overuse of L UE especially with biceps  Person educated: Patient Education method: Explanation, Demonstration, Verbal cues, and Handouts Education comprehension: verbalized understanding and needs further education  HOME EXERCISE PROGRAM: Access Code: [email or web address] URL: [email or web address] Date: 09/11/2023 Prepared by: Karie Mainland  Exercises - Supine Shoulder Press AAROM in Abduction with Dowel  - 1-3 x daily - 7 x weekly - 2 sets - 10 reps - 5 hold - Supine Shoulder Flexion Extension AAROM with Dowel  - 1-3 x daily - 7 x weekly - 2 sets - 10 reps - 5 hold - Supine Shoulder External Rotation with Dowel  - 1-3 x daily - 7 x weekly - 2 sets - 10 reps - 5 hold - Seated Scapular Retraction  - 1-3 x daily - 7 x weekly - 2 sets - 10 reps - 5 hold  ASSESSMENT:  CLINICAL IMPRESSION: Patient able to tolerate  AAROM with wand and pulleys well.  He initiated isometrics with PT resistance without increased pain.  AAROM overhead in supine is > 130 deg.  Patient needs cues to avoid hiking shoulder when lifting arm overhead.  Reinforced the need to avoid pulling with his L arm to protect the biceps. Pt is progressing well through his protocol.   Patient is a 63 y.o. male who was seen today for physical therapy evaluation and treatment for L shoulder surgery. He reports he has been working on moving his shoulder at home.  He is 6 weeks out from surgery.  He will transition to light strengthening but will ensure he is following MD protocol.   OBJECTIVE IMPAIRMENTS: cardiopulmonary status limiting activity, decreased mobility, decreased ROM, decreased strength, hypomobility, increased fascial restrictions, impaired flexibility, impaired sensation, impaired UE functional use, improper body mechanics, postural dysfunction, obesity, and pain.   ACTIVITY LIMITATIONS: carrying, lifting, sitting, standing, sleeping, stairs, bed mobility, bathing, dressing, reach over head, hygiene/grooming, locomotion level, and caring for others  PARTICIPATION LIMITATIONS: meal prep, cleaning, interpersonal relationship, driving, shopping, community activity, yard work, and recreation  PERSONAL FACTORS: Education, Past/current experiences, Social background, and 3+ comorbidities: HTN, CVA , DM   are also affecting patient's functional  outcome.   REHAB POTENTIAL: Excellent  CLINICAL DECISION MAKING: Evolving/moderate complexity  EVALUATION COMPLEXITY: Moderate   GOALS: Goals reviewed with patient? Yes  SHORT TERM GOALS: Target date: 10/09/2023    Pt will be I with HEP for shoulder A/AROM, strength  Baseline:given on eval  Goal status: INITIAL  2.  Pt will be able to use L arm for dressing and bathing with min difficulty.  Baseline: needs A  Goal status: INITIAL  3.  Pt will note pain at rest, no more than min for  recreation, meals Baseline: min to mod pain  Goal status: INITIAL  4.  Pt will be able to use pillows/ice/heat appropriately for pain mgmt at home.  Baseline: needs reinforcement  Goal status: INITIAL   LONG TERM GOALS: Target date: 11/06/2023    Pt will be I with HEP for final HEP upon discharge Baseline: unknown  Goal status: INITIAL  2.  Pt will be able to carry 1-3 light items in his L UE down by his side (groceries) without increased pain  Baseline: unable Goal status: INITIAL  3.  Pt will be able to improve ability to sleep/rest with only min disturbance from sleep.  Baseline: severely limited  Goal status: INITIAL  4.  Pt will be able to report no sensory symptoms in LUE  Baseline: moderate N/T in L UE  Goal status: INITIAL  5.  LEFS will improve to 75% or better  Baseline: 89% Goal status: INITIAL   PLAN:  PT FREQUENCY: 2x/week  PT DURATION: 8 weeks  PLANNED INTERVENTIONS: 97164- PT Re-evaluation, 97110-Therapeutic exercises, 97530- Therapeutic activity, 97112- Neuromuscular re-education, 97535- Self Care, 25366- Manual therapy, Patient/Family education, Taping, Dry Needling, Joint mobilization, Cryotherapy, and Moist heat  PLAN FOR NEXT SESSION: Add to HEP. Cont.  AAROM , OK to begin light strengthening /isometrics    Bethel Gaglio, PT 09/18/2023, 8:45 AM   Karie Mainland, PT 09/18/23 8:45 AM Phone: [phone] Fax: [phone]

## 2023-09-19 DIAGNOSIS — H3581 Retinal edema: Secondary | ICD-10-CM | POA: Diagnosis not present

## 2023-09-19 DIAGNOSIS — Z79899 Other long term (current) drug therapy: Secondary | ICD-10-CM | POA: Diagnosis not present

## 2023-09-19 DIAGNOSIS — H30033 Focal chorioretinal inflammation, peripheral, bilateral: Secondary | ICD-10-CM | POA: Diagnosis not present

## 2023-09-19 DIAGNOSIS — Z961 Presence of intraocular lens: Secondary | ICD-10-CM | POA: Diagnosis not present

## 2023-09-19 DIAGNOSIS — H4043X2 Glaucoma secondary to eye inflammation, bilateral, moderate stage: Secondary | ICD-10-CM | POA: Diagnosis not present

## 2023-09-20 NOTE — Progress Notes (Signed)
  Electrophysiology Office Note:   Date:  09/22/2023  ID:  Jason Stokes, DOB Mar 12, 1961, MRN [id number]  Primary Cardiologist: Will Jorja Loa, MD Electrophysiologist: Regan Lemming, MD      History of Present Illness:   Jason Stokes is a 63 y.o. male with h/o AF s/p ablation, HTN, and CVA seen today for routine electrophysiology followup.   Since last being seen in our clinic the patient reports doing well from a cardiac perspective. Overall,   he denies chest pain, palpitations, dyspnea, PND, orthopnea, nausea, vomiting, dizziness, syncope, edema, weight gain, or early satiety.  He has a mild burning at times from   Review of systems complete and found to be negative unless listed in HPI.   EP Information / Studies Reviewed:    EKG is ordered today. Personal review as below.  EKG Interpretation Date/Time:  Friday September 22 2023 09:15:26 EST Ventricular Rate:  74 PR Interval:  150 QRS Duration:  82 QT Interval:  368 QTC Calculation: 408 R Axis:   -10  Text Interpretation: Normal sinus rhythm Normal ECG When compared with ECG of 23-Mar-2023 08:38, T wave inversion no longer evident in Inferior leads Confirmed by Maxine Glenn [phone]) on 09/22/2023 9:24:47 AM    Arrhythmia/Device History S/p PVI 11/2015 S/p redo PVI and Box lesion 04/2022   Physical Exam:   VS:  BP (!) 154/82   Pulse 74   Ht 6\' 1"  (1.854 m)   Wt 251 lb 6.4 oz (114 kg)   SpO2 96%   BMI 33.17 kg/m    Wt Readings from Last 3 Encounters:  09/22/23 251 lb 6.4 oz (114 kg)  09/05/23 249 lb (112.9 kg)  08/31/23 249 lb (112.9 kg)     GEN: No acute distress NECK: No JVD; No carotid bruits CARDIAC: Regular rate and rhythm, no murmurs, rubs, gallops RESPIRATORY:  Clear to auscultation without rales, wheezing or rhonchi  ABDOMEN: Soft, non-tender, non-distended EXTREMITIES:  No edema; No deformity   ASSESSMENT AND PLAN:    PAF S/p ablation 11/2015 EKG today shows NSR Continue eliquis 5 mg BID for  CHA2DS2VASc of at least 3   Secondary hypercoagulable state Pt on Eliquis as above   HTN Elevated on arrival.  Wants to continue to follow at home and follow up with PCP if remains elevated.   Follow up with Dr. Elberta Fortis in 6 months  Signed, Graciella Freer, PA-C

## 2023-09-21 ENCOUNTER — Encounter: Payer: Self-pay | Admitting: Physical Therapy

## 2023-09-21 ENCOUNTER — Other Ambulatory Visit: Payer: Self-pay

## 2023-09-21 ENCOUNTER — Ambulatory Visit: Payer: Medicaid Other | Admitting: Physical Therapy

## 2023-09-21 ENCOUNTER — Encounter (HOSPITAL_BASED_OUTPATIENT_CLINIC_OR_DEPARTMENT_OTHER): Payer: Self-pay

## 2023-09-21 DIAGNOSIS — Z9889 Other specified postprocedural states: Secondary | ICD-10-CM

## 2023-09-21 DIAGNOSIS — J3081 Allergic rhinitis due to animal (cat) (dog) hair and dander: Secondary | ICD-10-CM | POA: Diagnosis not present

## 2023-09-21 DIAGNOSIS — M6281 Muscle weakness (generalized): Secondary | ICD-10-CM

## 2023-09-21 DIAGNOSIS — M25512 Pain in left shoulder: Secondary | ICD-10-CM | POA: Diagnosis not present

## 2023-09-21 DIAGNOSIS — M25612 Stiffness of left shoulder, not elsewhere classified: Secondary | ICD-10-CM

## 2023-09-21 NOTE — Therapy (Signed)
 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)    a.) s/p embolization 08/19/2020: 29 mm RIGHT internal iliac artery aneurysm   Anxiety    Aortic atherosclerosis (HCC)    Bilateral carpal tunnel syndrome 01/10/2018   Bipolar disorder (HCC)    Chorioretinal inflammation of both eyes    a.) on azothioprine   Chronic lower back pain    Coronary artery calcification seen on CT scan    a.) cCTA 04/21/2022: Ca score 20.6 (61st percentile for age/sex match control)   DDD (degenerative disc disease), cervical    Depression    Diastolic dysfunction    a.) TTE 01/13/2021: EF 60-65%, mod LVH, triv MR, G1DD   GERD (gastroesophageal reflux disease)    Hepatic steatosis    History of alcohol abuse    History of nuclear stress test    Myoview 10/16: EF 50%, diaphragmatic attenuation, no ischemia, low risk   Hypertension    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   a.) CT head 12/25/2012 --> RIGHT basal ganglia hypoattenuation related to remote lacunar infarct   Lipoma    Long term (current) use of anticoagulants    a.) apixaban   Long-term current use of immunomodulator    a.) on azothioprine for peripheral focal chorioretinal inflammation (both eyes)   Marijuana use    Migraine    Moderate persistent asthma with acute exacerbation 05/02/2018   PAF (paroxysmal atrial fibrillation) (HCC) 03/27/2015   a.) CHA2DS2VASc = 5  (HTN, CVA x2, vascular disease history, T2DM);  b.) s/p ablation 10/02/2015; c.) s/p ablation 12/10/2015; d.) s/p DCCV (200 J x 1) 12/11/2015; e.) s/p ablation 04/28/2022; f.) rate/rhythm maintained on oral diltiazem + carvedilol; chronically anticoagulated with apixaban   Pilonidal cyst    Schizophrenia (HCC)    Sciatica neuralgia    T2DM (type 2 diabetes mellitus) (HCC)    Thoracic aortic ectasia (HCC) 01/13/2021   a.) TTE 01/13/2021: Ao root 38 mm, asc Ao 39 mm   Past Surgical History:  Procedure Laterality Date   ATRIAL FIBRILLATION ABLATION  09/22/2015   ATRIAL FIBRILLATION ABLATION N/A 04/28/2022   Procedure: ATRIAL FIBRILLATION ABLATION;  Surgeon: Regan Lemming, MD;  Location: [facility];  Service: Cardiovascular;  Laterality: N/A;   CATARACT EXTRACTION Bilateral    CYST EXCISION  1996-97   surgery back of head    ELECTROPHYSIOLOGIC STUDY N/A 09/22/2015   Procedure: Atrial Fibrillation Ablation;  Surgeon: Will Jorja Loa, MD;  Location: [facility];  Service: Cardiovascular;  Laterality: N/A;   ELECTROPHYSIOLOGIC STUDY N/A 12/10/2015   Procedure: Atrial Fibrillation Ablation;  Surgeon: Will Jorja Loa, MD;  Location: [facility];  Service: Cardiovascular;  Laterality: N/A;   ELECTROPHYSIOLOGIC STUDY N/A 12/11/2015  Procedure: Cardioversion;  Surgeon: Will Jorja Loa, MD;  Location: [facility];  Service: Cardiovascular;  Laterality: N/A;   EMBOLIZATION (CATH LAB) Right 08/19/2020   Procedure: EMBOLIZATION;  Surgeon: Leonie Douglas, MD;  Location: [facility];  Service: Cardiovascular;  Laterality: Right;  hypogastric   EXCISION MASS HEAD N/A 01/06/2017   Procedure: EXCISION MASS FOREHEAD;  Surgeon: Glenna Fellows, MD;  Location: [facility];  Service: Plastics;  Laterality: N/A;   EXCISION MASS UPPER EXTREMETIES Right 08/08/2022   Procedure: EXCISION MASS RIGHT FOREARM;  Surgeon: Betha Loa, MD;  Location:  [facility];  Service: Orthopedics;  Laterality: Right;  45 MIN   GANGLION CYST EXCISION Left    INTERCOSTAL NERVE BLOCK  07/19/2003   KNEE ARTHROSCOPY Right 07/18/2014   MASS EXCISION N/A 01/25/2021   Procedure: EXCISION SUBCUTANEOUS VS SUBFASCIAL MASS TORSO 3CM;  Surgeon: Glenna Fellows, MD;  Location: [facility];  Service: Plastics;  Laterality: N/A;   PILONIDAL CYST EXCISION N/A 09/13/2022   Procedure: CYST EXCISION PILONIDAL EXTENSIVE;  Surgeon: Henrene Dodge, MD;  Location: [facility];  Service: General;  Laterality: N/A;   SHOULDER ARTHROSCOPY WITH ROTATOR CUFF REPAIR AND OPEN BICEPS TENODESIS Left 07/26/2023   Procedure: LEFT SHOULDER ARTHROSCOPY WITH OPEN ROTATOR CUFF REPAIR AND OPEN BICEPS TENODESIS;  Surgeon: Eldred Manges, MD;  Location: [facility];  Service: Orthopedics;  Laterality: Left;   Patient Active Problem List   Diagnosis Date Noted   Tendinitis of upper biceps tendon of left shoulder 06/12/2023   Complete tear of left rotator cuff 10/28/2022   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   Pilonidal cyst 09/13/2022   Aortic atherosclerosis (HCC) 06/07/2022   Hypercoagulable state due to paroxysmal atrial fibrillation (HCC) 03/23/2021   Type 2 diabetes mellitus with hyperglycemia, without long-term current use of insulin (HCC) 02/10/2021   Neck pain on left side 03/06/2019   Musculoskeletal chest pain 07/24/2018   Asthma, mild intermittent 05/02/2018   Allergic rhinitis caused by mold 05/02/2018   Tobacco use 05/02/2018   Bilateral carpal tunnel syndrome 01/10/2018   Stroke (HCC)    Schizophrenia (HCC)    Migraine    History of nuclear stress test    History of alcohol abuse    GERD (gastroesophageal reflux disease)    Dysrhythmia    Chronic lower back pain    Anxiety    Arthritis of knee, right 04/12/2017   Bipolar disorder (HCC) 04/03/2017   Lipoma of forehead 09/22/2016   Trigger ring finger of right hand  06/22/2016   Paroxysmal atrial fibrillation (HCC)    AF (atrial fibrillation) (HCC) 12/10/2015   Eczema 07/10/2015   History of CVA (cerebrovascular accident) 05/04/2015   Tear of medial meniscus of right knee 03/18/2015   Chronic pain of right knee 03/12/2015   Other and unspecified hyperlipidemia 11/25/2013   Nasal congestion 11/25/2013   Sinusitis, chronic 05/09/2013   Chronic low back pain 10/12/2012   Lumbar radiculopathy 10/12/2012   Hypertension associated with diabetes (HCC) 10/12/2012   Depression 10/12/2012   Insomnia 10/12/2012    PCP: Hoy Register MD   REFERRING PROVIDER: Annell Greening MD   REFERRING DIAG: [phone] (ICD-10-CM) - Complete tear of left rotator cuff, unspecified whether traumatic  THERAPY DIAG:  Acute pain of left shoulder  Status post left rotator cuff repair  Stiffness of left shoulder, not elsewhere classified  Muscle weakness (generalized)  Rationale for Evaluation and Treatment: Rehabilitation  ONSET DATE:  07/26/23   SUBJECTIVE:        SUBJECTIVE STATEMENT: Patient arrives reporting more right shoulder pain than left. States the spurs in the right shoulder are causing his pain. Reports 8/10 left shoulder pain and 10+/10 right shoulder pain.   EVAL: Pt with recent surgery for L RCT.  His shoulder pain is well controlled but he does say it locks up.  He does his pendulum exercises. He wears his sling on him all the time in case the pain increases. Patient is unable to use his Lt UE for any type of functional mobility.  He needs to be able to have full function of his shoulder end of May for a long drive.             PERTINENT HISTORY: HTN CVA DM Atrial fibrillation  PAIN:  Are you having pain? Yes: NPRS scale: 8/10 Pain location: deep in the shoulder joint  Pain description: sore achy and sharp Aggravating factors: holding it up, using it  Relieving factors: hot shower, massager, meds tried not to take it too much   PRECAUTIONS:  Other: none   RED FLAGS: None   WEIGHT BEARING RESTRICTIONS: No  FALLS:  Has patient fallen in last 6 months? 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   PLOF: Independent with household mobility without device, Independent with community mobility without device, Needs assistance with ADLs, and Leisure: has a CNA in [place]  PATIENT GOALS:Patient wants to get ready for the highway.  Get back to fishing    OBJECTIVE:  Note: Objective measures were complet ed at Evaluation unless otherwise noted. PATIENT SURVEYS:  Quick Dash 89    SENSATION: L biceps numb and tinging to fingers at times.  He reports this in the L UE as well.   POSTURE: Leans head to the L, shoulders elevated, shoulders rounded   UPPER EXTREMITY ROM:   Passive ROM Right eval Left eval Left 09/21/2023  Shoulder flexion  120 140 PROM  Shoulder extension     Shoulder abduction  85 90 PROM  Shoulder adduction     Shoulder internal rotation  55   Shoulder external rotation  45 60 PROM  Elbow flexion  full    Elbow extension  full    UPPER EXTREMITY MMT: NT due to post surgical. Appears to be 3-/5  MMT Right eval Left eval  Shoulder flexion    Shoulder extension    Shoulder abduction    Shoulder adduction    Shoulder internal rotation    Shoulder external rotation    Middle trapezius    Lower trapezius    Elbow flexion    Elbow extension    Wrist flexion    Wrist extension    Wrist ulnar deviation    Wrist radial deviation    Wrist pronation    Wrist supination    Grip strength (lbs)    (Blank rows = not tested)  SHOULDER SPECIAL TESTS: NT  JOINT MOBILITY TESTING:  Stiffness   PALPATION:  In TTP along Lt posterior and lateral shoulder  TREATMENT DATE:  [facility] Adult PT Treatment:                                                DATE: 09/18/23 Left shoulder PROM all directions to tolerance Gentle left GHJ mobs to improve shoulder mobility Supine dowel press AAROM x 10 Supine dowel flexion AAROM 2 x 10 Inclined 30 deg shoulder flexion AROM 2 x 10 Seated table slide at 45 deg incline 2 x 10 Seated ER/IR AROM at neutral 2 x 10   PATIENT EDUCATION: Education details: HEP review Person educated: Patient Education method: Programmer, multimedia, Facilities manager, Verbal cues Education comprehension: verbalized understanding and needs further education  HOME EXERCISE PROGRAM: Access Code: [email or web address]   ASSESSMENT: CLINICAL IMPRESSION: Patient tolerated therapy well with no adverse effects. Therapy continues to focus on progressing left shoulder PROM, AAROM, and AROM with gravity reduced with good tolerance. Overall his left shoulder PROM has progressed nicely but does remain limited with AROM against gravity. He was able to perform shoulder flexion AROM through full range at 30 deg but unable to raise greater than 90 at 45 deg incline due to weakness. His active ER with arm at side in a seated position is significantly limited compared to his PROM. Patient did report left shoulder pain reduced to 5/10 post session. No changes made to HEP this visit. Patient would benefit from continued skilled PT to progress his shoulder mobility and strength in order to reduce pain and maximize functional ability.    EVAL: Patient is a 63 y.o. male who was seen today for physical therapy evaluation and treatment for L shoulder surgery. He reports he has been working on moving his shoulder at home.  He is 6 weeks out from surgery.  He will transition to light strengthening but will ensure he is following MD protocol.   OBJECTIVE IMPAIRMENTS: cardiopulmonary status  limiting activity, decreased mobility, decreased ROM, decreased strength, hypomobility, increased fascial restrictions, impaired flexibility, impaired sensation, impaired UE functional use, improper body mechanics, postural dysfunction, obesity, and pain.   ACTIVITY LIMITATIONS: carrying, lifting, sitting, standing, sleeping, stairs, bed mobility, bathing, dressing, reach over head, hygiene/grooming, locomotion level, and caring for others  PARTICIPATION LIMITATIONS: meal prep, cleaning, interpersonal relationship, driving, shopping, community activity, yard work, and recreation  PERSONAL FACTORS: Education, Past/current experiences, Social background, and 3+ comorbidities: HTN, CVA , DM   are also affecting patient's functional outcome.    GOALS: Goals reviewed with patient? Yes  SHORT TERM GOALS: Target date: 10/09/2023  Pt will be I with HEP for shoulder A/AROM, strength  Baseline:given on eval  Goal status: INITIAL  2.  Pt will be able to use L arm for dressing and bathing with min difficulty.  Baseline: needs A  Goal status: INITIAL  3.  Pt will note pain at rest, no more than min for recreation, meals Baseline: min to mod pain  Goal status: INITIAL  4.  Pt will be able to use pillows/ice/heat appropriately for pain mgmt at home.  Baseline: needs reinforcement  Goal status: INITIAL  LONG TERM GOALS: Target date: 11/06/2023   Pt will be I with HEP for final HEP upon discharge Baseline: unknown  Goal status: INITIAL  2.  Pt will be able to carry 1-3 light items in his L UE down by his side (groceries) without increased pain  Baseline: unable Goal status: INITIAL  3.  Pt will be able to improve ability to sleep/rest with only min disturbance from sleep.  Baseline: severely limited  Goal status: INITIAL  4.  Pt will be able to report no sensory symptoms in LUE  Baseline: moderate N/T in L UE  Goal status: INITIAL  5.  LEFS will improve to 75% or better  Baseline:  89% Goal status: INITIAL   PLAN: PT FREQUENCY: 2x/week  PT DURATION: 8 weeks  PLANNED INTERVENTIONS: 97164- PT Re-evaluation, 97110-Therapeutic exercises, 97530- Therapeutic activity, 97112- Neuromuscular re-education, 97535- Self Care, 29528- Manual therapy, Patient/Family education, Taping, Dry Needling, Joint mobilization, Cryotherapy, and Moist heat  PLAN FOR NEXT SESSION: Add to HEP. Cont.  AAROM , OK to begin light strengthening /isometrics    Rosana Hoes, PT, DPT, LAT, ATC 09/21/23  10:17 AM Phone: [phone] Fax: [phone]

## 2023-09-21 NOTE — Progress Notes (Signed)
 VIAL 1 MADE 09-21-23. EXP 09-20-24

## 2023-09-22 ENCOUNTER — Encounter: Payer: Self-pay | Admitting: Student

## 2023-09-22 ENCOUNTER — Ambulatory Visit: Payer: Medicaid Other | Attending: Student | Admitting: Student

## 2023-09-22 ENCOUNTER — Ambulatory Visit (INDEPENDENT_AMBULATORY_CARE_PROVIDER_SITE_OTHER): Payer: Self-pay | Admitting: *Deleted

## 2023-09-22 VITALS — BP 154/82 | HR 74 | Ht 73.0 in | Wt 251.4 lb

## 2023-09-22 DIAGNOSIS — I10 Essential (primary) hypertension: Secondary | ICD-10-CM

## 2023-09-22 DIAGNOSIS — J309 Allergic rhinitis, unspecified: Secondary | ICD-10-CM

## 2023-09-22 DIAGNOSIS — I480 Paroxysmal atrial fibrillation: Secondary | ICD-10-CM

## 2023-09-22 DIAGNOSIS — D6869 Other thrombophilia: Secondary | ICD-10-CM | POA: Diagnosis not present

## 2023-09-22 NOTE — Therapy (Signed)
 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)    a.) s/p embolization 08/19/2020: 29 mm RIGHT internal iliac artery aneurysm   Anxiety    Aortic atherosclerosis (HCC)    Bilateral carpal tunnel syndrome 01/10/2018   Bipolar disorder (HCC)    Chorioretinal inflammation of both eyes    a.) on azothioprine   Chronic lower back pain    Coronary artery calcification seen on CT scan    a.) cCTA 04/21/2022: Ca score 20.6 (61st percentile for age/sex match control)   DDD (degenerative disc disease), cervical    Depression    Diastolic dysfunction    a.) TTE 01/13/2021: EF 60-65%, mod LVH, triv MR, G1DD   GERD (gastroesophageal reflux disease)    Hepatic steatosis    History of alcohol abuse    History of nuclear stress test    Myoview 10/16: EF 50%, diaphragmatic attenuation, no ischemia, low risk   Hypertension    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   a.) CT head 12/25/2012 --> RIGHT basal ganglia hypoattenuation related to remote lacunar infarct   Lipoma    Long term (current) use of anticoagulants    a.) apixaban   Long-term current use of immunomodulator    a.) on azothioprine for peripheral focal chorioretinal inflammation (both eyes)   Marijuana use    Migraine    Moderate persistent asthma with acute exacerbation 05/02/2018   PAF (paroxysmal atrial fibrillation) (HCC) 03/27/2015   a.) CHA2DS2VASc = 5  (HTN, CVA x2, vascular disease history, T2DM);  b.) s/p ablation 10/02/2015; c.) s/p ablation 12/10/2015; d.) s/p DCCV (200 J x 1) 12/11/2015; e.) s/p ablation 04/28/2022; f.) rate/rhythm maintained on oral diltiazem + carvedilol; chronically anticoagulated with apixaban   Pilonidal cyst    Schizophrenia (HCC)    Sciatica neuralgia    T2DM (type 2 diabetes mellitus) (HCC)    Thoracic aortic ectasia (HCC) 01/13/2021   a.) TTE 01/13/2021: Ao root 38 mm, asc Ao 39 mm   Past Surgical History:  Procedure Laterality Date   ATRIAL FIBRILLATION ABLATION  09/22/2015   ATRIAL FIBRILLATION ABLATION N/A 04/28/2022   Procedure: ATRIAL FIBRILLATION ABLATION;  Surgeon: Regan Lemming, MD;  Location: [facility];  Service: Cardiovascular;  Laterality: N/A;   CATARACT EXTRACTION Bilateral    CYST EXCISION  1996-97   surgery back of head    ELECTROPHYSIOLOGIC STUDY N/A 09/22/2015   Procedure: Atrial Fibrillation Ablation;  Surgeon: Will Jorja Loa, MD;  Location: [facility];  Service: Cardiovascular;  Laterality: N/A;   ELECTROPHYSIOLOGIC STUDY N/A 12/10/2015   Procedure: Atrial Fibrillation Ablation;  Surgeon: Will Jorja Loa, MD;  Location: [facility];  Service: Cardiovascular;  Laterality: N/A;   ELECTROPHYSIOLOGIC STUDY N/A 12/11/2015  Procedure: Cardioversion;  Surgeon: Will Jorja Loa, MD;  Location: [facility];  Service: Cardiovascular;  Laterality: N/A;   EMBOLIZATION (CATH LAB) Right 08/19/2020   Procedure: EMBOLIZATION;  Surgeon: Leonie Douglas, MD;  Location: [facility];  Service: Cardiovascular;  Laterality: Right;  hypogastric   EXCISION MASS HEAD N/A 01/06/2017   Procedure: EXCISION MASS FOREHEAD;  Surgeon: Glenna Fellows, MD;  Location: [facility];  Service: Plastics;  Laterality: N/A;   EXCISION MASS UPPER EXTREMETIES Right 08/08/2022   Procedure: EXCISION MASS RIGHT FOREARM;  Surgeon: Betha Loa, MD;  Location:  [facility];  Service: Orthopedics;  Laterality: Right;  45 MIN   GANGLION CYST EXCISION Left    INTERCOSTAL NERVE BLOCK  07/19/2003   KNEE ARTHROSCOPY Right 07/18/2014   MASS EXCISION N/A 01/25/2021   Procedure: EXCISION SUBCUTANEOUS VS SUBFASCIAL MASS TORSO 3CM;  Surgeon: Glenna Fellows, MD;  Location: [facility];  Service: Plastics;  Laterality: N/A;   PILONIDAL CYST EXCISION N/A 09/13/2022   Procedure: CYST EXCISION PILONIDAL EXTENSIVE;  Surgeon: Henrene Dodge, MD;  Location: [facility];  Service: General;  Laterality: N/A;   SHOULDER ARTHROSCOPY WITH ROTATOR CUFF REPAIR AND OPEN BICEPS TENODESIS Left 07/26/2023   Procedure: LEFT SHOULDER ARTHROSCOPY WITH OPEN ROTATOR CUFF REPAIR AND OPEN BICEPS TENODESIS;  Surgeon: Eldred Manges, MD;  Location: [facility];  Service: Orthopedics;  Laterality: Left;   Patient Active Problem List   Diagnosis Date Noted   Tendinitis of upper biceps tendon of left shoulder 06/12/2023   Complete tear of left rotator cuff 10/28/2022   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   Pilonidal cyst 09/13/2022   Aortic atherosclerosis (HCC) 06/07/2022   Hypercoagulable state due to paroxysmal atrial fibrillation (HCC) 03/23/2021   Type 2 diabetes mellitus with hyperglycemia, without long-term current use of insulin (HCC) 02/10/2021   Neck pain on left side 03/06/2019   Musculoskeletal chest pain 07/24/2018   Asthma, mild intermittent 05/02/2018   Allergic rhinitis caused by mold 05/02/2018   Tobacco use 05/02/2018   Bilateral carpal tunnel syndrome 01/10/2018   Stroke (HCC)    Schizophrenia (HCC)    Migraine    History of nuclear stress test    History of alcohol abuse    GERD (gastroesophageal reflux disease)    Dysrhythmia    Chronic lower back pain    Anxiety    Arthritis of knee, right 04/12/2017   Bipolar disorder (HCC) 04/03/2017   Lipoma of forehead 09/22/2016   Trigger ring finger of right hand  06/22/2016   Paroxysmal atrial fibrillation (HCC)    AF (atrial fibrillation) (HCC) 12/10/2015   Eczema 07/10/2015   History of CVA (cerebrovascular accident) 05/04/2015   Tear of medial meniscus of right knee 03/18/2015   Chronic pain of right knee 03/12/2015   Other and unspecified hyperlipidemia 11/25/2013   Nasal congestion 11/25/2013   Sinusitis, chronic 05/09/2013   Chronic low back pain 10/12/2012   Lumbar radiculopathy 10/12/2012   Hypertension associated with diabetes (HCC) 10/12/2012   Depression 10/12/2012   Insomnia 10/12/2012    PCP: Hoy Register MD   REFERRING PROVIDER: Annell Greening MD   REFERRING DIAG: [phone] (ICD-10-CM) - Complete tear of left rotator cuff, unspecified whether traumatic  THERAPY DIAG:  Acute pain of left shoulder  Status post left rotator cuff repair  Stiffness of left shoulder, not elsewhere classified  Muscle weakness (generalized)  Rationale for Evaluation and Treatment: Rehabilitation  ONSET DATE:  07/26/23   SUBJECTIVE:        SUBJECTIVE STATEMENT: When the weather changes I change. The allergies are so bad.  This time of year I try to stay indoors.  My back and Rt shoulder hurts too.    EVAL: Pt with recent surgery for L RCT.  His shoulder pain is well controlled but he does say it locks up.  He does his pendulum exercises. He wears his sling on him all the time in case the pain increases. Patient is unable to use his Lt UE for any type of functional mobility.  He needs to be able to have full function of his shoulder end of May for a long drive.             PERTINENT HISTORY: HTN CVA DM Atrial fibrillation  PAIN:  Are you having pain? Yes: NPRS scale: 7/10 Pain location: deep in the shoulder joint  Pain description: sore achy and sharp Aggravating factors: holding it up, using it  Relieving factors: hot shower, massager, meds tried not to take it too much   Are you having pain? Yes: NPRS scale: real bad/10 Pain location:  Back and Rt shoulder is keeping me up at night.  Pain description: sore achy and sharp Aggravating factors: holding it up, using it  Relieving factors: hot shower, massager, meds tried not to take it too much    PRECAUTIONS: Other: none   RED FLAGS: None   WEIGHT BEARING RESTRICTIONS: No  FALLS:  Has patient fallen in last 6 months? 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   PLOF: Independent with household mobility without device, Independent with community mobility without device, Needs assistance with ADLs, and Leisure: has a CNA in [place]  PATIENT GOALS:Patient wants to get ready for the highway.  Get back to fishing    OBJECTIVE:  Note: Objective measures were complet ed at Evaluation unless otherwise noted. PATIENT SURVEYS:  Quick Dash 89    SENSATION: L biceps numb and tinging to fingers at times.  He reports this in the L UE as well.   POSTURE: Leans head to the L, shoulders elevated, shoulders rounded   UPPER EXTREMITY ROM:   Passive ROM Right eval Left eval Left 09/21/2023  Shoulder flexion  120 140 PROM  Shoulder extension     Shoulder abduction  85 90 PROM  Shoulder adduction     Shoulder internal rotation  55   Shoulder external rotation  45 60 PROM  Elbow flexion  full    Elbow extension  full    UPPER EXTREMITY MMT: NT due to post surgical. Appears to be 3-/5  MMT Right eval Left eval  Shoulder flexion    Shoulder extension    Shoulder abduction    Shoulder adduction    Shoulder internal rotation    Shoulder external rotation    Middle trapezius    Lower trapezius    Elbow flexion    Elbow extension    Wrist flexion    Wrist extension    Wrist ulnar deviation    Wrist radial deviation    Wrist pronation     Wrist supination    Grip strength (lbs)    (Blank rows = not tested)  SHOULDER SPECIAL TESTS: NT  JOINT  MOBILITY TESTING:  Stiffness   PALPATION:  In TTP along Lt posterior and lateral shoulder                                                                                                                              TREATMENT DATE:  [facility] Adult PT Treatment:                                                DATE: 09/25/23 Manual Therapy: Left shoulder PROM all directions to tolerance Gentle left GHJ mobs to improve shoulder mobility Therapeutic Activity: Pulleys overhead 2 min  Seated shoulder AROM: ER/IR, scap retraction,elbow flex/ext  Single arm UE Ranger flex/ext AAROM, with added green band for row /returning to neutral Cross body reaching AAROM UE Ranger  Red band row and extension x 15  Supine ER/IR isometric alternating with PT  Supine AAROM chest press, overhead lift  2 x 10 each  Supine AAROM ER with towel at 45 deg abduction  Red band ER x 15  Modalities: MHP 10 min L shoulder   [facility] Adult PT Treatment:                                                DATE: 09/18/23 Left shoulder PROM all directions to tolerance Gentle left GHJ mobs to improve shoulder mobility Supine dowel press AAROM x 10 Supine dowel flexion AAROM 2 x 10 Inclined 30 deg shoulder flexion AROM 2 x 10 Seated table slide at 45 deg incline 2 x 10 Seated ER/IR AROM at neutral 2 x 10   PATIENT EDUCATION: Education details: HEP review Person educated: Patient Education method: Programmer, multimedia, Facilities manager, Verbal cues Education comprehension: verbalized understanding and needs further education  HOME EXERCISE PROGRAM: Access Code: [email or web address]   ASSESSMENT: CLINICAL IMPRESSION: Patient  in a good deal of pain today in both shoulders and back which limited his ability to tolerate therapy  Therapy continues to focus on progressing left shoulder PROM, AAROM, and AROM with gravity reduced with good  tolerance. Added light strengthening exercises to his home program.  Patient would benefit from continued skilled PT to progress his shoulder mobility and strength in order to reduce pain and maximize functional ability.    EVAL: Patient is a 63 y.o. male who was seen today for physical therapy evaluation and treatment for L shoulder surgery. He reports he has been working on moving his shoulder at home.  He is 6 weeks out from surgery.  He will transition to light strengthening but will ensure he is following MD protocol.   OBJECTIVE IMPAIRMENTS: cardiopulmonary status limiting activity, decreased mobility, decreased ROM, decreased strength, hypomobility, increased fascial restrictions, impaired flexibility, impaired  sensation, impaired UE functional use, improper body mechanics, postural dysfunction, obesity, and pain.   ACTIVITY LIMITATIONS: carrying, lifting, sitting, standing, sleeping, stairs, bed mobility, bathing, dressing, reach over head, hygiene/grooming, locomotion level, and caring for others  PARTICIPATION LIMITATIONS: meal prep, cleaning, interpersonal relationship, driving, shopping, community activity, yard work, and recreation  PERSONAL FACTORS: Education, Past/current experiences, Social background, and 3+ comorbidities: HTN, CVA , DM   are also affecting patient's functional outcome.    GOALS: Goals reviewed with patient? Yes  SHORT TERM GOALS: Target date: 10/09/2023  Pt will be I with HEP for shoulder A/AROM, strength  Baseline:given on eval  Goal status: INITIAL  2.  Pt will be able to use L arm for dressing and bathing with min difficulty.  Baseline: needs A  Goal status: INITIAL  3.  Pt will note pain at rest, no more than min for recreation, meals Baseline: min to mod pain  Goal status: INITIAL  4.  Pt will be able to use pillows/ice/heat appropriately for pain mgmt at home.  Baseline: needs reinforcement  Goal status: INITIAL  LONG TERM GOALS: Target date:  11/06/2023   Pt will be I with HEP for final HEP upon discharge Baseline: unknown  Goal status: INITIAL  2.  Pt will be able to carry 1-3 light items in his L UE down by his side (groceries) without increased pain  Baseline: unable Goal status: INITIAL  3.  Pt will be able to improve ability to sleep/rest with only min disturbance from sleep.  Baseline: severely limited  Goal status: INITIAL  4.  Pt will be able to report no sensory symptoms in LUE  Baseline: moderate N/T in L UE  Goal status: INITIAL  5.  LEFS will improve to 75% or better  Baseline: 89% Goal status: INITIAL   PLAN: PT FREQUENCY: 2x/week  PT DURATION: 8 weeks  PLANNED INTERVENTIONS: 97164- PT Re-evaluation, 97110-Therapeutic exercises, 97530- Therapeutic activity, 97112- Neuromuscular re-education, 97535- Self Care, 40981- Manual therapy, Patient/Family education, Taping, Dry Needling, Joint mobilization, Cryotherapy, and Moist heat  PLAN FOR NEXT SESSION: Add to HEP. Cont.  AAROM , OK to begin light strengthening /isometrics   Karie Mainland, PT 09/25/23 11:43 AM Phone: [phone] Fax: [phone]

## 2023-09-22 NOTE — Progress Notes (Signed)
 VIAL 2 MADE 09-22-23. EXP 09-21-24

## 2023-09-22 NOTE — Patient Instructions (Signed)
 Medication Instructions:  No Changes *If you need a refill on your cardiac medications before your next appointment, please call your pharmacy*   Lab Work: No Labs If you have labs (blood work) drawn today and your tests are completely normal, you will receive your results only by: MyChart Message (if you have MyChart) OR A paper copy in the mail If you have any lab test that is abnormal or we need to change your treatment, we will call you to review the results.   Testing/Procedures: No Testing   Follow-Up: At [facility], you and your health needs are our priority.  As part of our continuing mission to provide you with exceptional heart care, we have created designated Provider Care Teams.  These Care Teams include your primary Cardiologist (physician) and Advanced Practice Providers (APPs -  Physician Assistants and Nurse Practitioners) who all work together to provide you with the care you need, when you need it.  We recommend signing up for the patient portal called "MyChart".  Sign up information is provided on this After Visit Summary.  MyChart is used to connect with patients for Virtual Visits (Telemedicine).  Patients are able to view lab/test results, encounter notes, upcoming appointments, etc.  Non-urgent messages can be sent to your provider as well.   To learn more about what you can do with MyChart, go to [email or web address].    Your next appointment:   6 month(s)  Provider:   Loman Brooklyn, MD   Other Instructions   1st Floor: - Lobby - Registration  - Pharmacy  - Lab - Cafe  2nd Floor: - PV Lab - Diagnostic Testing (echo, CT, nuclear med)  3rd Floor: - Vacant  4th Floor: - TCTS (cardiothoracic surgery) - AFib Clinic - Structural Heart Clinic - Vascular Surgery  - Vascular Ultrasound  5th Floor: - [facility] (general and EP) - Clinical Pharmacy for coumadin, hypertension, lipid, weight-loss medications, and med management  appointments    Valet parking services will be available as well.

## 2023-09-25 ENCOUNTER — Ambulatory Visit: Payer: Medicaid Other | Admitting: Physical Therapy

## 2023-09-25 DIAGNOSIS — Z9889 Other specified postprocedural states: Secondary | ICD-10-CM

## 2023-09-25 DIAGNOSIS — M25512 Pain in left shoulder: Secondary | ICD-10-CM

## 2023-09-25 DIAGNOSIS — M25612 Stiffness of left shoulder, not elsewhere classified: Secondary | ICD-10-CM

## 2023-09-25 DIAGNOSIS — J3089 Other allergic rhinitis: Secondary | ICD-10-CM

## 2023-09-25 DIAGNOSIS — M6281 Muscle weakness (generalized): Secondary | ICD-10-CM

## 2023-09-27 DIAGNOSIS — I639 Cerebral infarction, unspecified: Secondary | ICD-10-CM | POA: Diagnosis not present

## 2023-09-28 ENCOUNTER — Ambulatory Visit: Payer: Medicaid Other | Admitting: Physical Therapy

## 2023-09-28 ENCOUNTER — Telehealth: Payer: Self-pay | Admitting: Physical Therapy

## 2023-09-28 DIAGNOSIS — I639 Cerebral infarction, unspecified: Secondary | ICD-10-CM | POA: Diagnosis not present

## 2023-09-28 NOTE — Telephone Encounter (Signed)
 Called patient regarding today's missed appt.  Left message on voicemail.  Reminded on next appt Monday 3/17/ at 8:45.  Karie Mainland, PT 09/28/23 9:28 AM Phone: [phone] Fax: [phone]

## 2023-09-29 ENCOUNTER — Ambulatory Visit (INDEPENDENT_AMBULATORY_CARE_PROVIDER_SITE_OTHER): Payer: Self-pay | Admitting: *Deleted

## 2023-09-29 DIAGNOSIS — J309 Allergic rhinitis, unspecified: Secondary | ICD-10-CM

## 2023-09-29 DIAGNOSIS — I639 Cerebral infarction, unspecified: Secondary | ICD-10-CM | POA: Diagnosis not present

## 2023-09-29 NOTE — Therapy (Signed)
 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)    a.) s/p embolization 08/19/2020: 29 mm RIGHT internal iliac artery aneurysm   Anxiety    Aortic atherosclerosis (HCC)    Bilateral carpal tunnel syndrome 01/10/2018   Bipolar disorder (HCC)    Chorioretinal inflammation of both eyes    a.) on azothioprine   Chronic lower back pain    Coronary artery calcification seen on CT scan    a.) cCTA 04/21/2022: Ca score 20.6 (61st percentile for age/sex match control)   DDD (degenerative disc disease), cervical    Depression    Diastolic dysfunction    a.) TTE 01/13/2021: EF 60-65%, mod LVH, triv MR, G1DD   GERD (gastroesophageal reflux disease)    Hepatic steatosis    History of alcohol abuse    History of nuclear stress test    Myoview 10/16: EF 50%, diaphragmatic attenuation, no ischemia, low risk   Hypertension    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   a.) CT head 12/25/2012 --> RIGHT basal ganglia hypoattenuation related to remote lacunar infarct   Lipoma    Long term (current) use of anticoagulants    a.) apixaban   Long-term current use of immunomodulator    a.) on azothioprine for peripheral focal chorioretinal inflammation (both eyes)   Marijuana use    Migraine    Moderate persistent asthma with acute exacerbation 05/02/2018   PAF (paroxysmal atrial fibrillation) (HCC) 03/27/2015   a.) CHA2DS2VASc = 5 (HTN, CVA x2, vascular disease history, T2DM);  b.) s/p ablation 10/02/2015; c.) s/p ablation 12/10/2015; d.) s/p  DCCV (200 J x 1) 12/11/2015; e.) s/p ablation 04/28/2022; f.) rate/rhythm maintained on oral diltiazem + carvedilol; chronically anticoagulated with apixaban   Pilonidal cyst    Schizophrenia (HCC)    Sciatica neuralgia    T2DM (type 2 diabetes mellitus) (HCC)    Thoracic aortic ectasia (HCC) 01/13/2021   a.) TTE 01/13/2021: Ao root 38 mm, asc Ao 39 mm   Past Surgical History:  Procedure Laterality Date   ATRIAL FIBRILLATION ABLATION  09/22/2015   ATRIAL FIBRILLATION ABLATION N/A 04/28/2022   Procedure: ATRIAL FIBRILLATION ABLATION;  Surgeon: Regan Lemming, MD;  Location: [facility];  Service: Cardiovascular;  Laterality: N/A;   CATARACT EXTRACTION Bilateral    CYST EXCISION  1996-97   surgery back of head    ELECTROPHYSIOLOGIC STUDY N/A 09/22/2015   Procedure: Atrial Fibrillation Ablation;  Surgeon: Will Jorja Loa, MD;  Location: [facility];  Service: Cardiovascular;  Laterality: N/A;   ELECTROPHYSIOLOGIC STUDY N/A 12/10/2015   Procedure: Atrial Fibrillation Ablation;  Surgeon: Will Jorja Loa, MD;  Location: [facility];  Service: Cardiovascular;  Laterality: N/A;   ELECTROPHYSIOLOGIC STUDY N/A 12/11/2015   Procedure: Cardioversion;  Surgeon: Will Jorja Loa, MD;  Location: [facility];  Service: Cardiovascular;  Laterality: N/A;   EMBOLIZATION (CATH LAB) Right 08/19/2020   Procedure: EMBOLIZATION;  Surgeon: Leonie Douglas, MD;  Location: [facility];  Service: Cardiovascular;  Laterality: Right;  hypogastric   EXCISION MASS HEAD N/A 01/06/2017   Procedure: EXCISION MASS FOREHEAD;  Surgeon: Glenna Fellows, MD;  Location: [facility];  Service: Plastics;  Laterality: N/A;   EXCISION MASS UPPER EXTREMETIES Right 08/08/2022   Procedure: EXCISION MASS RIGHT FOREARM;  Surgeon: Betha Loa, MD;  Location: [facility];  Service: Orthopedics;  Laterality: Right;  45 MIN   GANGLION CYST EXCISION Left     INTERCOSTAL NERVE BLOCK  07/19/2003   KNEE ARTHROSCOPY Right 07/18/2014   MASS EXCISION N/A 01/25/2021   Procedure: EXCISION SUBCUTANEOUS VS SUBFASCIAL MASS TORSO 3CM;  Surgeon: Glenna Fellows, MD;  Location: [facility];  Service: Plastics;  Laterality: N/A;   PILONIDAL CYST EXCISION N/A 09/13/2022   Procedure: CYST EXCISION PILONIDAL EXTENSIVE;  Surgeon: Henrene Dodge, MD;  Location: [facility];  Service: General;  Laterality: N/A;   SHOULDER ARTHROSCOPY WITH ROTATOR CUFF REPAIR AND OPEN BICEPS TENODESIS Left 07/26/2023   Procedure: LEFT SHOULDER ARTHROSCOPY WITH OPEN ROTATOR CUFF REPAIR AND OPEN BICEPS TENODESIS;  Surgeon: Eldred Manges, MD;  Location: [facility];  Service: Orthopedics;  Laterality: Left;   Patient Active Problem List   Diagnosis Date Noted   Tendinitis of upper biceps tendon of left shoulder 06/12/2023   Complete tear of left rotator cuff 10/28/2022   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   Pilonidal cyst 09/13/2022   Aortic atherosclerosis (HCC) 06/07/2022   Hypercoagulable state due to paroxysmal atrial fibrillation (HCC) 03/23/2021   Type 2 diabetes mellitus with hyperglycemia, without long-term current use of insulin (HCC) 02/10/2021   Neck pain on left side 03/06/2019   Musculoskeletal chest pain 07/24/2018   Asthma, mild intermittent 05/02/2018   Allergic rhinitis caused by mold 05/02/2018   Tobacco use 05/02/2018   Bilateral carpal tunnel syndrome 01/10/2018   Stroke (HCC)    Schizophrenia (HCC)    Migraine    History of nuclear stress test    History of alcohol abuse    GERD (gastroesophageal reflux disease)    Dysrhythmia    Chronic lower back pain    Anxiety    Arthritis of knee, right 04/12/2017   Bipolar disorder (HCC) 04/03/2017   Lipoma of forehead 09/22/2016   Trigger ring finger of right hand 06/22/2016   Paroxysmal atrial fibrillation (HCC)    AF (atrial fibrillation) (HCC) 12/10/2015   Eczema 07/10/2015    History of CVA (cerebrovascular accident) 05/04/2015   Tear of medial meniscus of right knee 03/18/2015   Chronic pain of right knee 03/12/2015   Other and unspecified hyperlipidemia 11/25/2013   Nasal congestion 11/25/2013   Sinusitis, chronic 05/09/2013   Chronic low back pain 10/12/2012   Lumbar radiculopathy 10/12/2012   Hypertension associated with diabetes (HCC) 10/12/2012   Depression 10/12/2012   Insomnia 10/12/2012    PCP: Hoy Register MD   REFERRING PROVIDER: Annell Greening MD   REFERRING DIAG: [phone] (ICD-10-CM) - Complete tear of left rotator cuff, unspecified whether traumatic  THERAPY DIAG:  Acute pain of left shoulder  Status post left rotator cuff repair  Stiffness of left shoulder, not elsewhere classified  Muscle weakness (generalized)  Rationale for Evaluation and Treatment: Rehabilitation  ONSET DATE: 07/26/23   SUBJECTIVE:        SUBJECTIVE STATEMENT: The pain has been so  bad. My Rt arm is 10/10 and my L is 8/10.  Sees Dr. Ophelia Charter tomorrow. Hoping to get a shot in my arm.  Pt missed his appt last week due of confusion of times.    EVAL: Pt with recent surgery for L RCT.  His shoulder pain is well controlled but he does say it locks up.  He does his pendulum exercises. He wears his sling on him all the time in case the pain increases. Patient is unable to use his Lt UE for any type of functional mobility.  He needs to be able to have full function of his shoulder end of May for a long drive.             PERTINENT HISTORY: HTN CVA DM Atrial fibrillation  PAIN:  Are you having pain? Yes: NPRS scale: 8/10 Pain location: deep in the shoulder joint  Pain description: sore achy and sharp Aggravating factors: holding it up, using it  Relieving factors: hot shower, massager, meds tried not to take it too much   Are you having pain? Yes: NPRS scale: real bad/10 Pain location: Back and Rt shoulder is keeping me up at night.  Pain description: sore achy  and sharp Aggravating factors: holding it up, using it  Relieving factors: hot shower, massager, meds tried not to take it too much    PRECAUTIONS: Other: none   RED FLAGS: None   WEIGHT BEARING RESTRICTIONS: No  FALLS:  Has patient fallen in last 6 months? 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   PLOF: Independent with household mobility without device, Independent with community mobility without device, Needs assistance with ADLs, and Leisure: has a CNA in [place]  PATIENT GOALS:Patient wants to get ready for the highway.  Get back to fishing    OBJECTIVE:  Note: Objective measures were complet ed at Evaluation unless otherwise noted. PATIENT SURVEYS:  Quick Dash 89    SENSATION: L biceps numb and tinging to fingers at times.  He reports this in the L UE as well.   POSTURE: Leans head to the L, shoulders elevated, shoulders rounded   UPPER EXTREMITY ROM:   Passive ROM Right eval Left eval Left 09/21/2023  Shoulder flexion  120 140 PROM  Shoulder extension     Shoulder abduction  85 90 PROM  Shoulder adduction     Shoulder internal rotation  55   Shoulder external rotation  45 60 PROM  Elbow flexion  full    Elbow extension  full    UPPER EXTREMITY MMT: NT due to post surgical. Appears to be 3-/5  MMT Right eval Left eval  Shoulder flexion    Shoulder extension    Shoulder abduction    Shoulder adduction    Shoulder internal rotation    Shoulder external rotation    Middle trapezius    Lower trapezius    Elbow flexion    Elbow extension    Wrist flexion    Wrist extension    Wrist ulnar deviation    Wrist radial deviation    Wrist pronation    Wrist supination    Grip strength (lbs)    (Blank rows = not  tested)  SHOULDER SPECIAL TESTS: NT  JOINT MOBILITY TESTING:  Stiffness  PALPATION:  In TTP along Lt posterior and lateral shoulder                                                                                                                              TREATMENT DATE:   [facility] Adult PT Treatment:                                                DATE: 10/02/23 Manual Therapy: Scapular mobilization, soft tissue to L subscapularis PROM all planes  Therapeutic Activity: Supine AAROM chest press, overhead lift  2 x 10 each  Elbow flexion with UE ranger (double arm)  Supine AAROM ER with towel at 45 deg abduction  Pendulum 2 min Table slides AAROM flexion double arm , ER , abduction x 10 each  Scapular retraction red TB (RTB) Row RTB single arm with upper body rotation  Extension RTB x 15  External rotation red x 15 Modalities: MHP 10 min     [facility] Adult PT Treatment:                                                DATE: 09/25/23 Manual Therapy: Left shoulder PROM all directions to tolerance Gentle left GHJ mobs to improve shoulder mobility Therapeutic Activity: Pulleys overhead 2 min  Seated shoulder AROM: ER/IR, scap retraction,elbow flex/ext  Single arm UE Ranger flex/ext AAROM, with added green band for row /returning to neutral Cross body reaching AAROM UE Ranger  Red band row and extension x 15  Supine ER/IR isometric alternating with PT  Supine AAROM chest press, overhead lift  2 x 10 each  Supine AAROM ER with towel at 45 deg abduction  Red band ER x 15  Modalities: MHP 10 min L shoulder   [facility] Adult PT Treatment:                                                DATE: 09/18/23 Left shoulder PROM all directions to tolerance Gentle left GHJ mobs to improve shoulder mobility Supine dowel press AAROM x 10 Supine dowel flexion AAROM 2 x 10 Inclined 30 deg shoulder flexion AROM 2 x 10 Seated table slide at 45 deg incline 2 x 10 Seated ER/IR AROM at neutral 2 x  10   PATIENT EDUCATION: Education details: HEP review Person educated: Patient Education method: Programmer, multimedia, Facilities manager, Verbal cues Education comprehension: verbalized understanding and needs further education  HOME EXERCISE PROGRAM: Access Code: [email or web address] Access Code: [email or web address] URL: [email or web address] Date: 10/02/2023 Prepared by: Karie Mainland  Exercises - Supine  Shoulder Press AAROM in Abduction with Dowel  - 1-3 x daily - 7 x weekly - 2 sets - 10 reps - 5 hold - Supine Shoulder Flexion Extension AAROM with Dowel  - 1-3 x daily - 7 x weekly - 2 sets - 10 reps - 5 hold - Supine Shoulder External Rotation with Dowel  - 1-3 x daily - 7 x weekly - 2 sets - 10 reps - 5 hold - Seated Scapular Retraction  - 1-3 x daily - 7 x weekly - 2 sets - 10 reps - 5 hold - Standing Shoulder Row with Anchored Resistance  - 1 x daily - 7 x weekly - 2 sets - 10 reps - 5 hold - Shoulder extension with resistance - Neutral  - 1 x daily - 7 x weekly - 2 sets - 10 reps - 5 hold  ASSESSMENT: CLINICAL IMPRESSION: Patient continues to have significant pain in both shoulders.  He reports his Rt UE is worse than the Tl.  He is hopeful he can get an injection tomorrow at his MD appt.  He has 2 bone spurs (per pt.)   Patient would benefit from continued skilled PT to progress his shoulder mobility and strength in order to reduce pain and maximize functional ability.    EVAL: Patient is a 63 y.o. male who was seen today for physical therapy evaluation and treatment for L shoulder surgery. He reports he has been working on moving his shoulder at home.  He is 6 weeks out from surgery.  He will transition to light strengthening but will ensure he is following MD protocol.   OBJECTIVE IMPAIRMENTS: cardiopulmonary status limiting activity, decreased mobility, decreased ROM, decreased strength, hypomobility, increased fascial restrictions, impaired flexibility, impaired sensation, impaired UE functional  use, improper body mechanics, postural dysfunction, obesity, and pain.   ACTIVITY LIMITATIONS: carrying, lifting, sitting, standing, sleeping, stairs, bed mobility, bathing, dressing, reach over head, hygiene/grooming, locomotion level, and caring for others  PARTICIPATION LIMITATIONS: meal prep, cleaning, interpersonal relationship, driving, shopping, community activity, yard work, and recreation  PERSONAL FACTORS: Education, Past/current experiences, Social background, and 3+ comorbidities: HTN, CVA , DM   are also affecting patient's functional outcome.    GOALS: Goals reviewed with patient? Yes  SHORT TERM GOALS: Target date: 10/09/2023  Pt will be I with HEP for shoulder A/AROM, strength  Baseline:given on eval  Goal status: met  2.  Pt will be able to use L arm for dressing and bathing with min difficulty.  Baseline: needs A  Goal status: ongoing   3.  Pt will note pain at rest, no more than min for recreation, meals Baseline: min to mod pain  Goal status:ongoing   4.  Pt will be able to use pillows/ice/heat appropriately for pain mgmt at home.  Baseline: needs reinforcement  Goal status:met  LONG TERM GOALS: Target date: 11/06/2023   Pt will be I with HEP for final HEP upon discharge Baseline: unknown  Goal status: INITIAL  2.  Pt will be able to carry 1-3 light items in his L UE down by his side (groceries) without increased pain  Baseline: unable Goal status: INITIAL  3.  Pt will be able to improve ability to sleep/rest with only min disturbance from sleep.  Baseline: severely limited  Goal status: INITIAL  4.  Pt will be able to report no sensory symptoms in LUE  Baseline: moderate N/T in L UE  Goal status: INITIAL  5.  LEFS will improve to  75% or better  Baseline: 89% Goal status: INITIAL   PLAN: PT FREQUENCY: 2x/week  PT DURATION: 8 weeks  PLANNED INTERVENTIONS: 97164- PT Re-evaluation, 97110-Therapeutic exercises, 97530- Therapeutic activity,  97112- Neuromuscular re-education, 97535- Self Care, 09811- Manual therapy, Patient/Family education, Taping, Dry Needling, Joint mobilization, Cryotherapy, and Moist heat  PLAN FOR NEXT SESSION: Add to HEP. Cont.  AAROM , OK to begin light strengthening /isometrics .  What did Dr. Oneta Rack, PT 10/02/23 9:28 AM Phone: [phone] Fax: [phone]

## 2023-09-30 DIAGNOSIS — I639 Cerebral infarction, unspecified: Secondary | ICD-10-CM | POA: Diagnosis not present

## 2023-10-01 DIAGNOSIS — I639 Cerebral infarction, unspecified: Secondary | ICD-10-CM | POA: Diagnosis not present

## 2023-10-02 ENCOUNTER — Encounter: Payer: Self-pay | Admitting: Physical Therapy

## 2023-10-02 ENCOUNTER — Ambulatory Visit: Payer: Medicaid Other | Admitting: Physical Therapy

## 2023-10-02 DIAGNOSIS — Z9889 Other specified postprocedural states: Secondary | ICD-10-CM

## 2023-10-02 DIAGNOSIS — M6281 Muscle weakness (generalized): Secondary | ICD-10-CM

## 2023-10-02 DIAGNOSIS — M25612 Stiffness of left shoulder, not elsewhere classified: Secondary | ICD-10-CM

## 2023-10-02 DIAGNOSIS — M25512 Pain in left shoulder: Secondary | ICD-10-CM

## 2023-10-02 DIAGNOSIS — I639 Cerebral infarction, unspecified: Secondary | ICD-10-CM | POA: Diagnosis not present

## 2023-10-03 ENCOUNTER — Encounter: Payer: Self-pay | Admitting: Orthopaedic Surgery

## 2023-10-03 ENCOUNTER — Ambulatory Visit (INDEPENDENT_AMBULATORY_CARE_PROVIDER_SITE_OTHER): Payer: Medicaid Other | Admitting: Orthopaedic Surgery

## 2023-10-03 VITALS — BP 150/100 | HR 94 | Ht 73.0 in | Wt 245.0 lb

## 2023-10-03 DIAGNOSIS — M75122 Complete rotator cuff tear or rupture of left shoulder, not specified as traumatic: Secondary | ICD-10-CM

## 2023-10-03 DIAGNOSIS — M7541 Impingement syndrome of right shoulder: Secondary | ICD-10-CM | POA: Insufficient documentation

## 2023-10-03 DIAGNOSIS — I639 Cerebral infarction, unspecified: Secondary | ICD-10-CM | POA: Diagnosis not present

## 2023-10-03 MED ORDER — BUPIVACAINE HCL 0.25 % IJ SOLN
4.0000 mL | INTRAMUSCULAR | Status: AC | PRN
  Administered 2023-10-03: 4 mL via INTRA_ARTICULAR

## 2023-10-03 MED ORDER — LIDOCAINE HCL 1 % IJ SOLN
0.5000 mL | INTRAMUSCULAR | Status: AC | PRN
  Administered 2023-10-03: .5 mL

## 2023-10-03 MED ORDER — METHYLPREDNISOLONE ACETATE 40 MG/ML IJ SUSP
40.0000 mg | INTRAMUSCULAR | Status: AC | PRN
  Administered 2023-10-03: 40 mg via INTRA_ARTICULAR

## 2023-10-03 NOTE — Progress Notes (Signed)
 Office Visit Note   [name]           Date of Birth: [birth year]           MRN: [id number] Visit Date: 10/03/2023              Requested by: Hoy Register, MD [address] [place],  [place] [address] PCP: Hoy Register, MD   Assessment & Plan: Visit Diagnoses:  1. Impingement syndrome of right shoulder   2. Complete tear of left rotator cuff, unspecified whether traumatic     Plan: Postop left shoulder rotator cuff repair biceps tenodesis doing therapy and making slow gradual progress.  Right shoulder injected.  If he has persistent problems he is seen in Dr.Dean's clinic in the past if right shoulder continues to bother him he can see Dr. August Saucer who can consider diagnostic imaging for his right shoulder.  He is continuing therapy for range of motion and strengthening of his left shoulder postop.  Again he requested pain medication we went over the multiple scripts that he is had in the last 2 years that is best for him to avoid this medication and we discussed the addictive nature of continued narcotic medication.  Follow-Up Instructions: No follow-ups on file.   Orders:  No orders of the defined types were placed in this encounter.  No orders of the defined types were placed in this encounter.     Procedures: Large Joint Inj: R subacromial bursa on 10/03/2023 10:01 AM Indications: pain Details: 22 G 1.5 in needle, lateral approach  Arthrogram: No  Medications: 4 mL bupivacaine 0.25 %; 40 mg methylPREDNISolone acetate 40 MG/ML; 0.5 mL lidocaine 1 % Outcome: tolerated well, no immediate complications Procedure, treatment alternatives, risks and benefits explained, specific risks discussed. Consent was given by the patient. Immediately prior to procedure a time out was called to verify the correct patient, procedure, equipment, support staff and site/side marked as required. Patient was prepped and draped in the usual sterile fashion.        Clinical Data: No additional findings.   Subjective: Chief Complaint  Patient presents with   Left Shoulder - Routine Post Op, Follow-up    07/26/2023 left shoulder arthroscopy, open RCR, BT    HPI 63 year old male returns post left rotator cuff repair biceps tenodesis.  He still states he is having significant pain in both shoulders.  Right shoulder is been worse since she has not been using the left much.  He started therapy on the left and is progressing.  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  He has not had a scan of his right shoulder.  Today he is requesting an injection in his right shoulder  Review of Systems updated unchanged   Objective: Vital Signs: BP (!) 150/100 Comment: just took blood pressure medication less than 20 minutes ago  Pulse 94   Ht 6\' 1"  (1.854 m)   Wt 245 lb (111.1 kg)   BMI 32.32 kg/m   Physical Exam  Ortho Exam  Specialty Comments:  CLINICAL DATA:  Chronic neck pain   EXAM: CT CERVICAL SPINE WITHOUT CONTRAST   TECHNIQUE: Multidetector CT imaging of the cervical spine was performed without intravenous contrast. Multiplanar CT image reconstructions were also generated.   RADIATION DOSE REDUCTION: This exam was performed according  to the departmental dose-optimization program which includes automated exposure control, adjustment of the mA and/or kV according to patient size and/or use of iterative reconstruction technique.   COMPARISON:  07/16/2022, 05/15/2022   FINDINGS: Alignment: Facet joints are aligned without dislocation or traumatic listhesis. Dens and lateral masses are aligned.   Skull base and vertebrae: No acute fracture. No primary bone lesion or focal pathologic process.   Soft tissues and spinal canal: No prevertebral fluid or swelling. No visible canal  hematoma.   Disc levels: Degenerative disc disease most pronounced at the C3-4 and C5-6 levels, without interval progression from recent previous CT and MRI.   Upper chest: Negative.   Other: None.   IMPRESSION: 1. No acute fracture or traumatic listhesis of the cervical spine. 2. 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     Electronically Signed   By: Duanne Guess D.O.   On: 07/27/2022 14:03  Imaging: No results found.   PMFS History: Patient Active Problem List   Diagnosis Date Noted   Impingement syndrome of right shoulder 10/03/2023   Tendinitis of upper biceps tendon of left shoulder 06/12/2023   Complete tear of left rotator cuff 10/28/2022   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   Pilonidal cyst 09/13/2022   Aortic atherosclerosis (HCC) 06/07/2022   Hypercoagulable state due to paroxysmal atrial fibrillation (HCC) 03/23/2021   Type 2 diabetes mellitus with hyperglycemia, without long-term current use of insulin (HCC) 02/10/2021   Neck pain on left side 03/06/2019   Musculoskeletal chest pain 07/24/2018   Asthma, mild intermittent 05/02/2018   Allergic rhinitis caused by mold 05/02/2018   Tobacco use 05/02/2018   Bilateral carpal tunnel syndrome 01/10/2018   Stroke (HCC)    Schizophrenia (HCC)    Migraine    History of nuclear stress test    History of alcohol abuse    GERD (gastroesophageal reflux disease)    Dysrhythmia    Chronic lower back pain    Anxiety    Arthritis of knee, right 04/12/2017   Bipolar disorder (HCC) 04/03/2017   Lipoma of forehead 09/22/2016   Trigger ring finger of right hand 06/22/2016   Paroxysmal atrial fibrillation (HCC)    AF (atrial fibrillation) (HCC) 12/10/2015   Eczema 07/10/2015   History of CVA (cerebrovascular accident) 05/04/2015   Tear of medial meniscus of right knee 03/18/2015   Chronic pain of right knee  03/12/2015   Other and unspecified hyperlipidemia 11/25/2013   Nasal congestion 11/25/2013   Sinusitis, chronic 05/09/2013   Chronic low back pain 10/12/2012   Lumbar radiculopathy 10/12/2012   Hypertension associated with diabetes (HCC) 10/12/2012   Depression 10/12/2012   Insomnia 10/12/2012   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)    a.) s/p embolization 08/19/2020: 29 mm RIGHT internal iliac artery aneurysm   Anxiety    Aortic atherosclerosis (HCC)    Bilateral carpal tunnel syndrome 01/10/2018   Bipolar disorder (HCC)    Chorioretinal inflammation of both eyes    a.) on azothioprine   Chronic lower back pain    Coronary artery calcification seen on CT scan    a.) cCTA 04/21/2022: Ca score 20.6 (61st percentile for age/sex match control)   DDD (degenerative disc disease), cervical    Depression    Diastolic dysfunction    a.) TTE 01/13/2021: EF 60-65%, mod LVH,  triv MR, G1DD   GERD (gastroesophageal reflux disease)    Hepatic steatosis    History of alcohol abuse    History of nuclear stress test    Myoview 10/16: EF 50%, diaphragmatic attenuation, no ischemia, low risk   Hypertension    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   a.) CT head 12/25/2012 --> RIGHT basal ganglia hypoattenuation related to remote lacunar infarct   Lipoma    Long term (current) use of anticoagulants    a.) apixaban   Long-term current use of immunomodulator    a.) on azothioprine for peripheral focal chorioretinal inflammation (both eyes)   Marijuana use    Migraine    Moderate persistent asthma with acute exacerbation 05/02/2018   PAF (paroxysmal atrial fibrillation) (HCC) 03/27/2015   a.) CHA2DS2VASc = 5 (HTN, CVA x2, vascular disease history, T2DM);  b.) s/p ablation 10/02/2015; c.) s/p ablation 12/10/2015; d.) s/p DCCV (200 J x 1) 12/11/2015; e.) s/p ablation 04/28/2022; f.) rate/rhythm maintained on oral diltiazem + carvedilol;  chronically anticoagulated with apixaban   Pilonidal cyst    Schizophrenia (HCC)    Sciatica neuralgia    T2DM (type 2 diabetes mellitus) (HCC)    Thoracic aortic ectasia (HCC) 01/13/2021   a.) TTE 01/13/2021: Ao root 38 mm, asc Ao 39 mm    Family History  Problem Relation Age of Onset   Heart disease Father    Schizophrenia Sister     Past Surgical History:  Procedure Laterality Date   ATRIAL FIBRILLATION ABLATION  09/22/2015   ATRIAL FIBRILLATION ABLATION N/A 04/28/2022   Procedure: ATRIAL FIBRILLATION ABLATION;  Surgeon: Regan Lemming, MD;  Location: [facility];  Service: Cardiovascular;  Laterality: N/A;   CATARACT EXTRACTION Bilateral    CYST EXCISION  1996-97   surgery back of head    ELECTROPHYSIOLOGIC STUDY N/A 09/22/2015   Procedure: Atrial Fibrillation Ablation;  Surgeon: Will Jorja Loa, MD;  Location: [facility];  Service: Cardiovascular;  Laterality: N/A;   ELECTROPHYSIOLOGIC STUDY N/A 12/10/2015   Procedure: Atrial Fibrillation Ablation;  Surgeon: Will Jorja Loa, MD;  Location: [facility];  Service: Cardiovascular;  Laterality: N/A;   ELECTROPHYSIOLOGIC STUDY N/A 12/11/2015   Procedure: Cardioversion;  Surgeon: Will Jorja Loa, MD;  Location: [facility];  Service: Cardiovascular;  Laterality: N/A;   EMBOLIZATION (CATH LAB) Right 08/19/2020   Procedure: EMBOLIZATION;  Surgeon: Leonie Douglas, MD;  Location: [facility];  Service: Cardiovascular;  Laterality: Right;  hypogastric   EXCISION MASS HEAD N/A 01/06/2017   Procedure: EXCISION MASS FOREHEAD;  Surgeon: Glenna Fellows, MD;  Location: [facility];  Service: Plastics;  Laterality: N/A;   EXCISION MASS UPPER EXTREMETIES Right 08/08/2022   Procedure: EXCISION MASS RIGHT FOREARM;  Surgeon: Betha Loa, MD;  Location: [facility];  Service: Orthopedics;  Laterality: Right;  45 MIN   GANGLION CYST EXCISION Left    INTERCOSTAL  NERVE BLOCK  07/19/2003   KNEE ARTHROSCOPY Right 07/18/2014   MASS EXCISION N/A 01/25/2021   Procedure: EXCISION SUBCUTANEOUS VS SUBFASCIAL MASS TORSO 3CM;  Surgeon: Glenna Fellows, MD;  Location: [facility];  Service: Plastics;  Laterality: N/A;   PILONIDAL CYST EXCISION N/A 09/13/2022   Procedure: CYST EXCISION PILONIDAL EXTENSIVE;  Surgeon: Henrene Dodge, MD;  Location: [facility];  Service: General;  Laterality: N/A;   SHOULDER ARTHROSCOPY WITH ROTATOR CUFF REPAIR AND OPEN BICEPS TENODESIS Left 07/26/2023   Procedure: LEFT SHOULDER ARTHROSCOPY WITH OPEN  ROTATOR CUFF REPAIR AND OPEN BICEPS TENODESIS;  Surgeon: Eldred Manges, MD;  Location: [facility];  Service: Orthopedics;  Laterality: Left;   Social History   Occupational History    Comment: disabled  Tobacco Use   Smoking status: Some Days    Current packs/day: 0.00    Types: Cigarettes    Start date: 09/10/1993    Last attempt to quit: 09/10/2022    Years since quitting: 1.0    Passive exposure: Never   Smokeless tobacco: Never   Tobacco comments:    1 cigarettes every day  06-02-2021  Vaping Use   Vaping status: Never Used  Substance and Sexual Activity   Alcohol use: Yes    Alcohol/week: 6.0 standard drinks of alcohol    Types: 6 Cans of beer per week    Comment: occasional   Drug use: Yes    Types: Marijuana    Comment: been 1 week   Sexual activity: Not Currently

## 2023-10-04 DIAGNOSIS — I639 Cerebral infarction, unspecified: Secondary | ICD-10-CM | POA: Diagnosis not present

## 2023-10-04 NOTE — Therapy (Unsigned)
 OUTPATIENT PHYSICAL THERAPY TREATMENT   Patient Name: Jason Stokes MRN: [id number] DOB:28-Sep-1960, 63 y.o., male Today's Date: 10/05/2023   END OF SESSION:  PT End of Session - 10/05/23 0906     Visit Number 6    Number of Visits 16    Date for PT Re-Evaluation 11/06/23    Authorization Type [employer]    PT Start Time 0900    PT Stop Time 0940    PT Time Calculation (min) 40 min    Activity Tolerance Patient tolerated treatment well    Behavior During Therapy [facility] for tasks assessed/performed                  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)    a.) s/p embolization 08/19/2020: 29 mm RIGHT internal iliac artery aneurysm   Anxiety    Aortic atherosclerosis (HCC)    Bilateral carpal tunnel syndrome 01/10/2018   Bipolar disorder (HCC)    Chorioretinal inflammation of both eyes    a.) on azothioprine   Chronic lower back pain    Coronary artery calcification seen on CT scan    a.) cCTA 04/21/2022: Ca score 20.6 (61st percentile for age/sex match control)   DDD (degenerative disc disease), cervical    Depression    Diastolic dysfunction    a.) TTE 01/13/2021: EF 60-65%, mod LVH, triv MR, G1DD   GERD (gastroesophageal reflux disease)    Hepatic steatosis    History of alcohol abuse    History of nuclear stress test    Myoview 10/16: EF 50%, diaphragmatic attenuation, no ischemia, low risk   Hypertension    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   a.) CT head 12/25/2012 --> RIGHT basal ganglia hypoattenuation related to remote lacunar infarct   Lipoma    Long term (current) use of anticoagulants    a.) apixaban   Long-term current use of immunomodulator    a.) on azothioprine for peripheral focal chorioretinal inflammation (both eyes)   Marijuana use    Migraine    Moderate persistent asthma with acute exacerbation 05/02/2018   PAF (paroxysmal atrial fibrillation) (HCC) 03/27/2015   a.) CHA2DS2VASc =  5 (HTN, CVA x2, vascular disease history, T2DM);  b.) s/p ablation 10/02/2015; c.) s/p ablation 12/10/2015; d.) s/p DCCV (200 J x 1) 12/11/2015; e.) s/p ablation 04/28/2022; f.) rate/rhythm maintained on oral diltiazem + carvedilol; chronically anticoagulated with apixaban   Pilonidal cyst    Schizophrenia (HCC)    Sciatica neuralgia    T2DM (type 2 diabetes mellitus) (HCC)    Thoracic aortic ectasia (HCC) 01/13/2021   a.) TTE 01/13/2021: Ao root 38 mm, asc Ao 39 mm   Past Surgical History:  Procedure Laterality Date   ATRIAL FIBRILLATION ABLATION  09/22/2015   ATRIAL FIBRILLATION ABLATION N/A 04/28/2022   Procedure: ATRIAL FIBRILLATION ABLATION;  Surgeon: Regan Lemming, MD;  Location: [facility];  Service: Cardiovascular;  Laterality: N/A;   CATARACT EXTRACTION Bilateral    CYST EXCISION  1996-97   surgery back of head    ELECTROPHYSIOLOGIC STUDY N/A 09/22/2015   Procedure: Atrial Fibrillation Ablation;  Surgeon: Will Jorja Loa, MD;  Location: [facility];  Service: Cardiovascular;  Laterality: N/A;   ELECTROPHYSIOLOGIC STUDY N/A 12/10/2015   Procedure: Atrial Fibrillation Ablation;  Surgeon: Will Jorja Loa, MD;  Location: [facility];  Service: Cardiovascular;  Laterality: N/A;   ELECTROPHYSIOLOGIC STUDY N/A  12/11/2015   Procedure: Cardioversion;  Surgeon: Will Jorja Loa, MD;  Location: [facility];  Service: Cardiovascular;  Laterality: N/A;   EMBOLIZATION (CATH LAB) Right 08/19/2020   Procedure: EMBOLIZATION;  Surgeon: Leonie Douglas, MD;  Location: [facility];  Service: Cardiovascular;  Laterality: Right;  hypogastric   EXCISION MASS HEAD N/A 01/06/2017   Procedure: EXCISION MASS FOREHEAD;  Surgeon: Glenna Fellows, MD;  Location: [facility];  Service: Plastics;  Laterality: N/A;   EXCISION MASS UPPER EXTREMETIES Right 08/08/2022   Procedure: EXCISION MASS RIGHT FOREARM;  Surgeon: Betha Loa, MD;   Location: [facility];  Service: Orthopedics;  Laterality: Right;  45 MIN   GANGLION CYST EXCISION Left    INTERCOSTAL NERVE BLOCK  07/19/2003   KNEE ARTHROSCOPY Right 07/18/2014   MASS EXCISION N/A 01/25/2021   Procedure: EXCISION SUBCUTANEOUS VS SUBFASCIAL MASS TORSO 3CM;  Surgeon: Glenna Fellows, MD;  Location: [facility];  Service: Plastics;  Laterality: N/A;   PILONIDAL CYST EXCISION N/A 09/13/2022   Procedure: CYST EXCISION PILONIDAL EXTENSIVE;  Surgeon: Henrene Dodge, MD;  Location: [facility];  Service: General;  Laterality: N/A;   SHOULDER ARTHROSCOPY WITH ROTATOR CUFF REPAIR AND OPEN BICEPS TENODESIS Left 07/26/2023   Procedure: LEFT SHOULDER ARTHROSCOPY WITH OPEN ROTATOR CUFF REPAIR AND OPEN BICEPS TENODESIS;  Surgeon: Eldred Manges, MD;  Location: [facility];  Service: Orthopedics;  Laterality: Left;   Patient Active Problem List   Diagnosis Date Noted   Impingement syndrome of right shoulder 10/03/2023   Tendinitis of upper biceps tendon of left shoulder 06/12/2023   Complete tear of left rotator cuff 10/28/2022   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   Pilonidal cyst 09/13/2022   Aortic atherosclerosis (HCC) 06/07/2022   Hypercoagulable state due to paroxysmal atrial fibrillation (HCC) 03/23/2021   Type 2 diabetes mellitus with hyperglycemia, without long-term current use of insulin (HCC) 02/10/2021   Neck pain on left side 03/06/2019   Musculoskeletal chest pain 07/24/2018   Asthma, mild intermittent 05/02/2018   Allergic rhinitis caused by mold 05/02/2018   Tobacco use 05/02/2018   Bilateral carpal tunnel syndrome 01/10/2018   Stroke (HCC)    Schizophrenia (HCC)    Migraine    History of nuclear stress test    History of alcohol abuse    GERD (gastroesophageal reflux disease)    Dysrhythmia    Chronic lower back pain    Anxiety    Arthritis of knee, right 04/12/2017   Bipolar disorder (HCC) 04/03/2017   Lipoma  of forehead 09/22/2016   Trigger ring finger of right hand 06/22/2016   Paroxysmal atrial fibrillation (HCC)    AF (atrial fibrillation) (HCC) 12/10/2015   Eczema 07/10/2015   History of CVA (cerebrovascular accident) 05/04/2015   Tear of medial meniscus of right knee 03/18/2015   Chronic pain of right knee 03/12/2015   Other and unspecified hyperlipidemia 11/25/2013   Nasal congestion 11/25/2013   Sinusitis, chronic 05/09/2013   Chronic low back pain 10/12/2012   Lumbar radiculopathy 10/12/2012   Hypertension associated with diabetes (HCC) 10/12/2012   Depression 10/12/2012   Insomnia 10/12/2012    PCP: Hoy Register MD   REFERRING PROVIDER: Annell Greening MD   REFERRING DIAG: [phone] (ICD-10-CM) - Complete tear of left rotator cuff, unspecified whether traumatic  THERAPY DIAG:  Acute pain of left shoulder  Status post left rotator cuff repair  Stiffness of left shoulder, not elsewhere classified  Muscle weakness (  generalized)  Rationale for Evaluation and Treatment: Rehabilitation  ONSET DATE: 07/26/23   SUBJECTIVE:        SUBJECTIVE STATEMENT: Rt arm does not even hurt right now.  The doctor didn't give me any more pain meds. Lt arm is about 8/10.    EVAL: Pt with recent surgery for L RCT.  His shoulder pain is well controlled but he does say it locks up.  He does his pendulum exercises. He wears his sling on him all the time in case the pain increases. Patient is unable to use his Lt UE for any type of functional mobility.  He needs to be able to have full function of his shoulder end of May for a long drive.            MD note: PERTINENT HISTORY: HTN CVA DM Atrial fibrillation  PAIN:  Are you having pain? Yes: NPRS scale: 8/10 Pain location: deep in the shoulder joint  Pain description: sore achy and sharp Aggravating factors: holding it up, using it  Relieving factors: hot shower, massager, meds tried not to take it too much   Are you having pain? Yes: NPRS  scale: real bad/10 Pain location: Back and Rt shoulder is keeping me up at night.  Pain description: sore achy and sharp Aggravating factors: holding it up, using it  Relieving factors: hot shower, massager, meds tried not to take it too much    PRECAUTIONS: Other: none   RED FLAGS: None   WEIGHT BEARING RESTRICTIONS: No  FALLS:  Has patient fallen in last 6 months? 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   PLOF: Independent with household mobility without device, Independent with community mobility without device, Needs assistance with ADLs, and Leisure: has a CNA in [place]  PATIENT GOALS:Patient wants to get ready for the highway.  Get back to fishing    OBJECTIVE:  Note: Objective measures were complet ed at Evaluation unless otherwise noted. PATIENT SURVEYS:  Quick Dash 89    SENSATION: L biceps numb and tinging to fingers at times.  He reports this in the L UE as well.   POSTURE: Leans head to the L, shoulders elevated, shoulders rounded   UPPER EXTREMITY ROM:   Passive ROM Right eval Left eval Left 09/21/2023  Shoulder flexion  120 140 PROM  Shoulder extension     Shoulder abduction  85 90 PROM  Shoulder adduction     Shoulder internal rotation  55   Shoulder external rotation  45 60 PROM  Elbow flexion  full    Elbow extension  full    UPPER EXTREMITY MMT: NT due to post surgical. Appears to be 3-/5  MMT Right eval Left eval  Shoulder flexion    Shoulder extension    Shoulder abduction    Shoulder adduction    Shoulder internal rotation    Shoulder external rotation    Middle trapezius    Lower trapezius    Elbow flexion    Elbow extension    Wrist flexion    Wrist extension    Wrist ulnar deviation    Wrist  radial deviation    Wrist pronation    Wrist supination    Grip strength (lbs)    (Blank rows = not tested)  SHOULDER SPECIAL TESTS: NT  JOINT MOBILITY TESTING:  Stiffness   PALPATION:  In TTP along Lt posterior and lateral shoulder                                                                                                                              TREATMENT DATE:   [facility] Adult PT Treatment:                                                DATE: 10/05/23 Therapeutic Activity: Seated  Table slides flexion, scaption, abduction, ER  Deltoid strength (isometric and mid range lift off from high mat table)  IR hand on ball , ER elbow on ball x 10 each  Seated flexion L UE AROM x 10 (long arm)  Seated abduction x 10 (long arm)  Pulley overhead 3 min  Modalities:  Cold pack L UE 8 min     [facility] Adult PT Treatment:                                                DATE: 10/02/23 Manual Therapy: Scapular mobilization, soft tissue to L subscapularis PROM all planes  Therapeutic Activity: Supine AAROM chest press, overhead lift  2 x 10 each  Elbow flexion with UE ranger (double arm)  Supine AAROM ER with towel at 45 deg abduction  Pendulum 2 min Table slides AAROM flexion double arm , ER , abduction x 10 each  Scapular retraction red TB (RTB) Row RTB single arm with upper body rotation  Extension RTB x 15  External rotation red x 15 Modalities: MHP 10 min     [facility] Adult PT Treatment:                                                DATE: 09/25/23 Manual Therapy: Left shoulder PROM all directions to tolerance Gentle left GHJ mobs to improve shoulder mobility Therapeutic Activity: Pulleys overhead 2 min  Seated shoulder AROM: ER/IR, scap retraction,elbow flex/ext  Single arm UE Ranger flex/ext AAROM, with added green band for row /returning to neutral Cross body reaching AAROM UE Ranger  Red band row and extension x 15  Supine ER/IR isometric alternating with PT  Supine AAROM  chest press, overhead lift  2 x 10 each  Supine AAROM ER with towel at 45 deg abduction  Red band ER x 15  Modalities: MHP 10 min L shoulder   [facility] Adult PT Treatment:  DATE: 09/18/23 Left shoulder PROM all directions to tolerance Gentle left GHJ mobs to improve shoulder mobility Supine dowel press AAROM x 10 Supine dowel flexion AAROM 2 x 10 Inclined 30 deg shoulder flexion AROM 2 x 10 Seated table slide at 45 deg incline 2 x 10 Seated ER/IR AROM at neutral 2 x 10   PATIENT EDUCATION: Education details: HEP review Person educated: Patient Education method: Programmer, multimedia, Facilities manager, Verbal cues Education comprehension: verbalized understanding and needs further education  HOME EXERCISE PROGRAM: Access Code: [email or web address] Access Code: [email or web address] URL: [email or web address] Date: 10/02/2023 Prepared by: Karie Mainland  Exercises - Supine Shoulder Press AAROM in Abduction with Dowel  - 1-3 x daily - 7 x weekly - 2 sets - 10 reps - 5 hold - Supine Shoulder Flexion Extension AAROM with Dowel  - 1-3 x daily - 7 x weekly - 2 sets - 10 reps - 5 hold - Supine Shoulder External Rotation with Dowel  - 1-3 x daily - 7 x weekly - 2 sets - 10 reps - 5 hold - Seated Scapular Retraction  - 1-3 x daily - 7 x weekly - 2 sets - 10 reps - 5 hold - Standing Shoulder Row with Anchored Resistance  - 1 x daily - 7 x weekly - 2 sets - 10 reps - 5 hold - Shoulder extension with resistance - Neutral  - 1 x daily - 7 x weekly - 2 sets - 10 reps - 5 hold  ASSESSMENT: CLINICAL IMPRESSION: Patient received an effective injection in his Rt UE. We began to do more seated light stregnth to L Rotator cuff today. Pt was a bit late to session, encouraged an on time arrival for maximum benefit.  He is only able to lift his arm to about 80 deg in standing (flexion/abduction). He no longer has pain medication and Dr. Ophelia Charter (per his report) will no longer be seeing.   Patient would benefit from continued skilled PT to progress his shoulder mobility and strength in order to reduce pain and maximize functional ability.    EVAL: Patient is a 63 y.o. male who was seen today for physical therapy evaluation and treatment for L shoulder surgery. He reports he has been working on moving his shoulder at home.  He is 6 weeks out from surgery.  He will transition to light strengthening but will ensure he is following MD protocol.   OBJECTIVE IMPAIRMENTS: cardiopulmonary status limiting activity, decreased mobility, decreased ROM, decreased strength, hypomobility, increased fascial restrictions, impaired flexibility, impaired sensation, impaired UE functional use, improper body mechanics, postural dysfunction, obesity, and pain.   ACTIVITY LIMITATIONS: carrying, lifting, sitting, standing, sleeping, stairs, bed mobility, bathing, dressing, reach over head, hygiene/grooming, locomotion level, and caring for others  PARTICIPATION LIMITATIONS: meal prep, cleaning, interpersonal relationship, driving, shopping, community activity, yard work, and recreation  PERSONAL FACTORS: Education, Past/current experiences, Social background, and 3+ comorbidities: HTN, CVA , DM   are also affecting patient's functional outcome.    GOALS: Goals reviewed with patient? Yes  SHORT TERM GOALS: Target date: 10/09/2023  Pt will be I with HEP for shoulder A/AROM, strength  Baseline:given on eval  Goal status: met  2.  Pt will be able to use L arm for dressing and bathing with min difficulty.  Baseline: needs A  Goal status: ongoing   3.  Pt will note pain at rest, no more than min for recreation, meals Baseline: min to mod pain  Goal status:ongoing   4.  Pt will be  able to use pillows/ice/heat appropriately for pain mgmt at home.  Baseline: needs reinforcement  Goal status:met  LONG TERM GOALS: Target date: 11/06/2023   Pt will be I with HEP for final HEP upon discharge Baseline:  unknown  Goal status: INITIAL  2.  Pt will be able to carry 1-3 light items in his L UE down by his side (groceries) without increased pain  Baseline: unable Goal status: INITIAL  3.  Pt will be able to improve ability to sleep/rest with only min disturbance from sleep.  Baseline: severely limited  Goal status: INITIAL  4.  Pt will be able to report no sensory symptoms in LUE  Baseline: moderate N/T in L UE  Goal status: INITIAL  5.  LEFS will improve to 75% or better  Baseline: 89% Goal status: INITIAL   PLAN: PT FREQUENCY: 2x/week  PT DURATION: 8 weeks  PLANNED INTERVENTIONS: 97164- PT Re-evaluation, 97110-Therapeutic exercises, 97530- Therapeutic activity, 97112- Neuromuscular re-education, 97535- Self Care, 16109- Manual therapy, Patient/Family education, Taping, Dry Needling, Joint mobilization, Cryotherapy, and Moist heat  PLAN FOR NEXT SESSION: Add to HEP. Cont.  AAROM , Light strength.     Karie Mainland, PT 10/05/23 1:46 PM Phone: [phone] Fax: [phone]

## 2023-10-05 ENCOUNTER — Encounter: Payer: Self-pay | Admitting: Physical Therapy

## 2023-10-05 ENCOUNTER — Ambulatory Visit: Payer: Medicaid Other | Admitting: Physical Therapy

## 2023-10-05 DIAGNOSIS — M6281 Muscle weakness (generalized): Secondary | ICD-10-CM

## 2023-10-05 DIAGNOSIS — I639 Cerebral infarction, unspecified: Secondary | ICD-10-CM | POA: Diagnosis not present

## 2023-10-05 DIAGNOSIS — Z9889 Other specified postprocedural states: Secondary | ICD-10-CM

## 2023-10-05 DIAGNOSIS — M25512 Pain in left shoulder: Secondary | ICD-10-CM | POA: Diagnosis not present

## 2023-10-05 DIAGNOSIS — M25612 Stiffness of left shoulder, not elsewhere classified: Secondary | ICD-10-CM

## 2023-10-06 ENCOUNTER — Ambulatory Visit (INDEPENDENT_AMBULATORY_CARE_PROVIDER_SITE_OTHER): Payer: Self-pay

## 2023-10-06 DIAGNOSIS — J309 Allergic rhinitis, unspecified: Secondary | ICD-10-CM | POA: Diagnosis not present

## 2023-10-06 DIAGNOSIS — I639 Cerebral infarction, unspecified: Secondary | ICD-10-CM | POA: Diagnosis not present

## 2023-10-07 DIAGNOSIS — I639 Cerebral infarction, unspecified: Secondary | ICD-10-CM | POA: Diagnosis not present

## 2023-10-08 DIAGNOSIS — I639 Cerebral infarction, unspecified: Secondary | ICD-10-CM | POA: Diagnosis not present

## 2023-10-09 DIAGNOSIS — I639 Cerebral infarction, unspecified: Secondary | ICD-10-CM | POA: Diagnosis not present

## 2023-10-10 DIAGNOSIS — I639 Cerebral infarction, unspecified: Secondary | ICD-10-CM | POA: Diagnosis not present

## 2023-10-11 ENCOUNTER — Ambulatory Visit (INDEPENDENT_AMBULATORY_CARE_PROVIDER_SITE_OTHER): Payer: Self-pay | Admitting: *Deleted

## 2023-10-11 DIAGNOSIS — I639 Cerebral infarction, unspecified: Secondary | ICD-10-CM | POA: Diagnosis not present

## 2023-10-11 DIAGNOSIS — J309 Allergic rhinitis, unspecified: Secondary | ICD-10-CM

## 2023-10-12 ENCOUNTER — Ambulatory Visit: Admitting: Physical Therapy

## 2023-10-12 DIAGNOSIS — Z9889 Other specified postprocedural states: Secondary | ICD-10-CM

## 2023-10-12 DIAGNOSIS — M25512 Pain in left shoulder: Secondary | ICD-10-CM

## 2023-10-12 DIAGNOSIS — I639 Cerebral infarction, unspecified: Secondary | ICD-10-CM | POA: Diagnosis not present

## 2023-10-12 DIAGNOSIS — M6281 Muscle weakness (generalized): Secondary | ICD-10-CM

## 2023-10-12 DIAGNOSIS — M25612 Stiffness of left shoulder, not elsewhere classified: Secondary | ICD-10-CM

## 2023-10-12 NOTE — Therapy (Signed)
 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)    a.) s/p embolization 08/19/2020: 29 mm RIGHT internal iliac artery aneurysm   Anxiety    Aortic atherosclerosis (HCC)    Bilateral carpal tunnel syndrome 01/10/2018   Bipolar disorder (HCC)    Chorioretinal inflammation of both eyes    a.) on azothioprine   Chronic lower back pain    Coronary artery calcification seen on CT scan    a.) cCTA 04/21/2022: Ca score 20.6 (61st percentile for age/sex match control)   DDD (degenerative disc disease), cervical    Depression    Diastolic dysfunction    a.) TTE 01/13/2021: EF 60-65%, mod LVH, triv MR, G1DD   GERD (gastroesophageal reflux disease)    Hepatic steatosis    History of alcohol abuse    History of nuclear stress test    Myoview 10/16: EF 50%, diaphragmatic attenuation, no ischemia, low risk   Hypertension    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   a.) CT head 12/25/2012 --> RIGHT basal ganglia hypoattenuation related to remote lacunar infarct   Lipoma    Long term (current) use of anticoagulants    a.) apixaban   Long-term current use of immunomodulator    a.) on azothioprine for peripheral focal chorioretinal inflammation (both eyes)   Marijuana use    Migraine    Moderate persistent asthma with acute exacerbation 05/02/2018   PAF (paroxysmal atrial fibrillation) (HCC) 03/27/2015   a.) CHA2DS2VASc =  5 (HTN, CVA x2, vascular disease history, T2DM);  b.) s/p ablation 10/02/2015; c.) s/p ablation 12/10/2015; d.) s/p DCCV (200 J x 1) 12/11/2015; e.) s/p ablation 04/28/2022; f.) rate/rhythm maintained on oral diltiazem + carvedilol; chronically anticoagulated with apixaban   Pilonidal cyst    Schizophrenia (HCC)    Sciatica neuralgia    T2DM (type 2 diabetes mellitus) (HCC)    Thoracic aortic ectasia (HCC) 01/13/2021   a.) TTE 01/13/2021: Ao root 38 mm, asc Ao 39 mm   Past Surgical History:  Procedure Laterality Date   ATRIAL FIBRILLATION ABLATION  09/22/2015   ATRIAL FIBRILLATION ABLATION N/A 04/28/2022   Procedure: ATRIAL FIBRILLATION ABLATION;  Surgeon: Regan Lemming, MD;  Location: [facility];  Service: Cardiovascular;  Laterality: N/A;   CATARACT EXTRACTION Bilateral    CYST EXCISION  1996-97   surgery back of head    ELECTROPHYSIOLOGIC STUDY N/A 09/22/2015   Procedure: Atrial Fibrillation Ablation;  Surgeon: Will Jorja Loa, MD;  Location: [facility];  Service: Cardiovascular;  Laterality: N/A;   ELECTROPHYSIOLOGIC STUDY N/A 12/10/2015   Procedure: Atrial Fibrillation Ablation;  Surgeon: Will Jorja Loa, MD;  Location: [facility];  Service: Cardiovascular;  Laterality: N/A;   ELECTROPHYSIOLOGIC STUDY N/A  12/11/2015   Procedure: Cardioversion;  Surgeon: Will Jorja Loa, MD;  Location: [facility];  Service: Cardiovascular;  Laterality: N/A;   EMBOLIZATION (CATH LAB) Right 08/19/2020   Procedure: EMBOLIZATION;  Surgeon: Leonie Douglas, MD;  Location: [facility];  Service: Cardiovascular;  Laterality: Right;  hypogastric   EXCISION MASS HEAD N/A 01/06/2017   Procedure: EXCISION MASS FOREHEAD;  Surgeon: Glenna Fellows, MD;  Location: [facility];  Service: Plastics;  Laterality: N/A;   EXCISION MASS UPPER EXTREMETIES Right 08/08/2022   Procedure: EXCISION MASS RIGHT FOREARM;  Surgeon: Betha Loa, MD;   Location: [facility];  Service: Orthopedics;  Laterality: Right;  45 MIN   GANGLION CYST EXCISION Left    INTERCOSTAL NERVE BLOCK  07/19/2003   KNEE ARTHROSCOPY Right 07/18/2014   MASS EXCISION N/A 01/25/2021   Procedure: EXCISION SUBCUTANEOUS VS SUBFASCIAL MASS TORSO 3CM;  Surgeon: Glenna Fellows, MD;  Location: [facility];  Service: Plastics;  Laterality: N/A;   PILONIDAL CYST EXCISION N/A 09/13/2022   Procedure: CYST EXCISION PILONIDAL EXTENSIVE;  Surgeon: Henrene Dodge, MD;  Location: [facility];  Service: General;  Laterality: N/A;   SHOULDER ARTHROSCOPY WITH ROTATOR CUFF REPAIR AND OPEN BICEPS TENODESIS Left 07/26/2023   Procedure: LEFT SHOULDER ARTHROSCOPY WITH OPEN ROTATOR CUFF REPAIR AND OPEN BICEPS TENODESIS;  Surgeon: Eldred Manges, MD;  Location: [facility];  Service: Orthopedics;  Laterality: Left;   Patient Active Problem List   Diagnosis Date Noted   Impingement syndrome of right shoulder 10/03/2023   Tendinitis of upper biceps tendon of left shoulder 06/12/2023   Complete tear of left rotator cuff 10/28/2022   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   Pilonidal cyst 09/13/2022   Aortic atherosclerosis (HCC) 06/07/2022   Hypercoagulable state due to paroxysmal atrial fibrillation (HCC) 03/23/2021   Type 2 diabetes mellitus with hyperglycemia, without long-term current use of insulin (HCC) 02/10/2021   Neck pain on left side 03/06/2019   Musculoskeletal chest pain 07/24/2018   Asthma, mild intermittent 05/02/2018   Allergic rhinitis caused by mold 05/02/2018   Tobacco use 05/02/2018   Bilateral carpal tunnel syndrome 01/10/2018   Stroke (HCC)    Schizophrenia (HCC)    Migraine    History of nuclear stress test    History of alcohol abuse    GERD (gastroesophageal reflux disease)    Dysrhythmia    Chronic lower back pain    Anxiety    Arthritis of knee, right 04/12/2017   Bipolar disorder (HCC) 04/03/2017   Lipoma  of forehead 09/22/2016   Trigger ring finger of right hand 06/22/2016   Paroxysmal atrial fibrillation (HCC)    AF (atrial fibrillation) (HCC) 12/10/2015   Eczema 07/10/2015   History of CVA (cerebrovascular accident) 05/04/2015   Tear of medial meniscus of right knee 03/18/2015   Chronic pain of right knee 03/12/2015   Other and unspecified hyperlipidemia 11/25/2013   Nasal congestion 11/25/2013   Sinusitis, chronic 05/09/2013   Chronic low back pain 10/12/2012   Lumbar radiculopathy 10/12/2012   Hypertension associated with diabetes (HCC) 10/12/2012   Depression 10/12/2012   Insomnia 10/12/2012    PCP: Hoy Register MD   REFERRING PROVIDER: Annell Greening MD   REFERRING DIAG: [phone] (ICD-10-CM) - Complete tear of left rotator cuff, unspecified whether traumatic  THERAPY DIAG:  Acute pain of left shoulder  Status post left rotator cuff repair  Stiffness of left shoulder, not elsewhere classified  Muscle weakness (  generalized)  Rationale for Evaluation and Treatment: Rehabilitation  ONSET DATE: 07/26/23   SUBJECTIVE:        SUBJECTIVE STATEMENT: Both arms are killing me.  The shot wore off.  Pt reports he has 1 pain pill left and he is worried.  He has frustration about how he wasn't weaned off his medicine.     PERTINENT HISTORY: HTN CVA DM Atrial fibrillation  PAIN:  Are you having pain? Yes: NPRS scale: 9/10 Pain location: deep in the shoulder joint  Pain description: sore achy and sharp Aggravating factors: holding it up, using it  Relieving factors: hot shower, massager, meds tried not to take it too much   Are you having pain? Yes: NPRS scale: real bad/10 Pain location: Back and Rt shoulder is keeping me up at night.  Pain description: sore achy and sharp Aggravating factors: holding it up, using it  Relieving factors: hot shower, massager, meds tried not to take it too much    PRECAUTIONS: Other: none   RED FLAGS: None   WEIGHT BEARING  RESTRICTIONS: No  FALLS:  Has patient fallen in last 6 months? 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   PLOF: Independent with household mobility without device, Independent with community mobility without device, Needs assistance with ADLs, and Leisure: has a CNA in [place]  PATIENT GOALS:Patient wants to get ready for the highway.  Get back to fishing    OBJECTIVE:  Note: Objective measures were complet ed at Evaluation unless otherwise noted. PATIENT SURVEYS:  Quick Dash 89    SENSATION: L biceps numb and tinging to fingers at times.  He reports this in the L UE as well.   POSTURE: Leans head to the L, shoulders elevated, shoulders rounded   UPPER EXTREMITY ROM:   Passive ROM Right eval Left eval Left 09/21/2023 Rt 10/12/23 Lt 10/12/23  Shoulder flexion  120 140 PROM 136 90 A  Shoulder extension       Shoulder abduction  85 90 PROM 126 60 A  Shoulder adduction       Shoulder internal rotation  55   FR to L L3  Shoulder external rotation  45 60 PROM    Elbow flexion  full      Elbow extension  full      UPPER EXTREMITY MMT: NT due to post surgical. Appears to be 3-/5  MMT Right 10/12/23 Left 10/12/23  Shoulder flexion 4+/5 3-/5  Shoulder extension    Shoulder abduction 4/5 3-/5  Shoulder adduction    Shoulder internal rotation  4/5  Shoulder external rotation  3+/5  Middle trapezius    Lower trapezius    Elbow flexion    Elbow extension    Wrist flexion    Wrist extension    Wrist ulnar deviation    Wrist radial deviation    Wrist pronation    Wrist supination    Grip strength (lbs)    (Blank rows = not tested)  SHOULDER SPECIAL TESTS: NT  JOINT MOBILITY TESTING:  Stiffness   PALPATION:  In TTP along Lt posterior and  lateral shoulder  TREATMENT DATE:  [facility] Adult PT Treatment:                                                DATE: 10/12/23 Therapeutic Activity: ROM and mobility, pain control  Pulleys scaption and overhead 2 min each during subjective  Supine scapular retraction AAROM ER, chest press and OH flexion to tolerance x 15 each  Wall slide AAROM flexion bilateral 1 min  Seated AROM Rt and L UE  Standing abd yellow band bilateral  Standing ER yellow with chest press  Flexion with yellow loop 0-70 deg  Modalities: MHP 12 min  Manual Therapy: Soft tissue to L biceps, ant, mid deltoid and L upper trap    [facility] Adult PT Treatment:                                                DATE: 10/05/23 Therapeutic Activity: Seated  Table slides flexion, scaption, abduction, ER  Deltoid strength (isometric and mid range lift off from high mat table)  IR hand on ball , ER elbow on ball x 10 each  Seated flexion L UE AROM x 10 (long arm)  Seated abduction x 10 (long arm)  Pulley overhead 3 min  Modalities:  Cold pack L UE 8 min     [facility] Adult PT Treatment:                                                DATE: 10/02/23 Manual Therapy: Scapular mobilization, soft tissue to L subscapularis PROM all planes  Therapeutic Activity: Supine AAROM chest press, overhead lift  2 x 10 each  Elbow flexion with UE ranger (double arm)  Supine AAROM ER with towel at 45 deg abduction  Pendulum 2 min Table slides AAROM flexion double arm , ER , abduction x 10 each  Scapular retraction red TB (RTB) Row RTB single arm with upper body rotation  Extension RTB x 15  External rotation red x 15 Modalities: MHP 10 min      PATIENT EDUCATION: Education details: HEP review Person educated: Patient Education method: Programmer, multimedia, Facilities manager, Verbal cues Education comprehension: verbalized  understanding and needs further education  HOME EXERCISE PROGRAM: Access Code: [email or web address] Access Code: [email or web address] URL: [email or web address] Date: 10/02/2023 Prepared by: Karie Mainland  Exercises - Supine Shoulder Press AAROM in Abduction with Dowel  - 1-3 x daily - 7 x weekly - 2 sets - 10 reps - 5 hold - Supine Shoulder Flexion Extension AAROM with Dowel  - 1-3 x daily - 7 x weekly - 2 sets - 10 reps - 5 hold - Supine Shoulder External Rotation with Dowel  - 1-3 x daily - 7 x weekly - 2 sets - 10 reps - 5 hold - Seated Scapular Retraction  - 1-3 x daily - 7 x weekly - 2 sets - 10 reps - 5 hold - Standing Shoulder Row with Anchored Resistance  - 1 x daily - 7 x weekly - 2 sets - 10 reps - 5 hold - Shoulder extension with resistance - Neutral  - 1 x daily -  7 x weekly - 2 sets - 10 reps - 5 hold - added looped band for flexion    ASSESSMENT:  CLINICAL IMPRESSION: Patient continues to have significant pain in both of his shoulders.  He is limited in AROM of L UE see above.  He is able to work on shoulder strengthening despite pain levels of 8/10.  He is diligent about his HEP.  Pt reports Dr. Ophelia Charter told him who else would be seeing him at the office as he is retiring bit he does not recall who. PT will reach out for the patient to get more info on this.  He plans to use his TENS unit and is looking forward to getting dry needling in the coming weeks for pain relief.     EVAL: Patient is a 63 y.o. male who was seen today for physical therapy evaluation and treatment for L shoulder surgery. He reports he has been working on moving his shoulder at home.  He is 6 weeks out from surgery.  He will transition to light strengthening but will ensure he is following MD protocol.   OBJECTIVE IMPAIRMENTS: cardiopulmonary status limiting activity, decreased mobility, decreased ROM, decreased strength, hypomobility, increased fascial restrictions, impaired flexibility, impaired sensation,  impaired UE functional use, improper body mechanics, postural dysfunction, obesity, and pain.   ACTIVITY LIMITATIONS: carrying, lifting, sitting, standing, sleeping, stairs, bed mobility, bathing, dressing, reach over head, hygiene/grooming, locomotion level, and caring for others  PARTICIPATION LIMITATIONS: meal prep, cleaning, interpersonal relationship, driving, shopping, community activity, yard work, and recreation  PERSONAL FACTORS: Education, Past/current experiences, Social background, and 3+ comorbidities: HTN, CVA , DM   are also affecting patient's functional outcome.    GOALS: Goals reviewed with patient? Yes  SHORT TERM GOALS: Target date: 10/09/2023  Pt will be I with HEP for shoulder A/AROM, strength  Baseline:given on eval  Goal status: met  2.  Pt will be able to use L arm for dressing and bathing with min difficulty.  Baseline: needs A  Goal status: ongoing   3.  Pt will note pain at rest, no more than min for recreation, meals Baseline: min to mod pain  Goal status:ongoing   4.  Pt will be able to use pillows/ice/heat appropriately for pain mgmt at home.  Baseline: needs reinforcement  Goal status:met  LONG TERM GOALS: Target date: 11/06/2023   Pt will be I with HEP for final HEP upon discharge Baseline: unknown  Goal status: INITIAL  2.  Pt will be able to carry 1-3 light items in his L UE down by his side (groceries) without increased pain  Baseline: unable Goal status: INITIAL  3.  Pt will be able to improve ability to sleep/rest with only min disturbance from sleep.  Baseline: severely limited  Goal status: INITIAL  4.  Pt will be able to report no sensory symptoms in LUE  Baseline: moderate N/T in L UE  Goal status: INITIAL  5.  LEFS will improve to 75% or better  Baseline: 89% Goal status: INITIAL   PLAN: PT FREQUENCY: 2x/week  PT DURATION: 8 weeks  PLANNED INTERVENTIONS: 97164- PT Re-evaluation, 97110-Therapeutic exercises, 97530-  Therapeutic activity, 97112- Neuromuscular re-education, 97535- Self Care, 81191- Manual therapy, Patient/Family education, Taping, Dry Needling, Joint mobilization, Cryotherapy, and Moist heat  PLAN FOR NEXT SESSION: Add to HEP. Cont.  AAROM , Light strength.     Karie Mainland, PT 10/12/23 11:56 AM Phone: [phone] Fax: [phone]

## 2023-10-13 DIAGNOSIS — I639 Cerebral infarction, unspecified: Secondary | ICD-10-CM | POA: Diagnosis not present

## 2023-10-14 DIAGNOSIS — I639 Cerebral infarction, unspecified: Secondary | ICD-10-CM | POA: Diagnosis not present

## 2023-10-15 DIAGNOSIS — I639 Cerebral infarction, unspecified: Secondary | ICD-10-CM | POA: Diagnosis not present

## 2023-10-16 ENCOUNTER — Telehealth: Payer: Self-pay

## 2023-10-16 DIAGNOSIS — I639 Cerebral infarction, unspecified: Secondary | ICD-10-CM | POA: Diagnosis not present

## 2023-10-16 NOTE — Telephone Encounter (Signed)
 Patient would like a Rx refill on Oxycodone sent to his pharmacy.  CB# [phone].  Please advise.  Thank you.

## 2023-10-16 NOTE — Telephone Encounter (Signed)
 I called patient and advised. He states that he has not received that much pain medication and that he is doing PT. I advised that Dr. Ophelia Charter spoke with him at his 3/18 appointment and explained he will not refill  pain medication. Patient is upset stating he should be weaned off. He also complains of right shoulder pain stating the injection given to him on 3/18 did not help at all. I explained, per note, Dr. Ophelia Charter wanted him to follow up with Dr. August Saucer if continued problems with the right shoulder. Appointment made.

## 2023-10-17 DIAGNOSIS — I639 Cerebral infarction, unspecified: Secondary | ICD-10-CM | POA: Diagnosis not present

## 2023-10-18 ENCOUNTER — Ambulatory Visit (INDEPENDENT_AMBULATORY_CARE_PROVIDER_SITE_OTHER): Payer: Self-pay | Admitting: *Deleted

## 2023-10-18 DIAGNOSIS — J309 Allergic rhinitis, unspecified: Secondary | ICD-10-CM

## 2023-10-18 DIAGNOSIS — I639 Cerebral infarction, unspecified: Secondary | ICD-10-CM | POA: Diagnosis not present

## 2023-10-19 DIAGNOSIS — I639 Cerebral infarction, unspecified: Secondary | ICD-10-CM | POA: Diagnosis not present

## 2023-10-20 DIAGNOSIS — I639 Cerebral infarction, unspecified: Secondary | ICD-10-CM | POA: Diagnosis not present

## 2023-10-21 DIAGNOSIS — I639 Cerebral infarction, unspecified: Secondary | ICD-10-CM | POA: Diagnosis not present

## 2023-10-22 DIAGNOSIS — I639 Cerebral infarction, unspecified: Secondary | ICD-10-CM | POA: Diagnosis not present

## 2023-10-23 DIAGNOSIS — I639 Cerebral infarction, unspecified: Secondary | ICD-10-CM | POA: Diagnosis not present

## 2023-10-23 NOTE — Therapy (Signed)
 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)    a.) s/p embolization 08/19/2020: 29 mm RIGHT internal iliac artery aneurysm   Anxiety    Aortic atherosclerosis (HCC)    Bilateral carpal tunnel syndrome 01/10/2018   Bipolar disorder (HCC)    Chorioretinal inflammation of both eyes    a.) on azothioprine   Chronic lower back pain    Coronary artery calcification seen on CT scan    a.) cCTA 04/21/2022: Ca score 20.6 (61st percentile for age/sex match control)   DDD (degenerative disc disease), cervical    Depression    Diastolic dysfunction    a.) TTE 01/13/2021: EF 60-65%, mod LVH, triv MR, G1DD   GERD (gastroesophageal reflux disease)    Hepatic steatosis    History of alcohol abuse    History of nuclear stress test    Myoview 10/16: EF 50%, diaphragmatic attenuation, no ischemia, low risk   Hypertension    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   a.) CT head 12/25/2012 --> RIGHT basal ganglia hypoattenuation related to remote lacunar infarct   Lipoma    Long term (current) use of anticoagulants    a.) apixaban   Long-term current use of immunomodulator    a.) on azothioprine for peripheral focal chorioretinal inflammation (both eyes)   Marijuana use    Migraine    Moderate persistent asthma with acute exacerbation 05/02/2018   PAF (paroxysmal atrial fibrillation) (HCC) 03/27/2015   a.) CHA2DS2VASc  = 5 (HTN, CVA x2, vascular disease history, T2DM);  b.) s/p ablation 10/02/2015; c.) s/p ablation 12/10/2015; d.) s/p DCCV (200 J x 1) 12/11/2015; e.) s/p ablation 04/28/2022; f.) rate/rhythm maintained on oral diltiazem + carvedilol; chronically anticoagulated with apixaban   Pilonidal cyst    Schizophrenia (HCC)    Sciatica neuralgia    T2DM (type 2 diabetes mellitus) (HCC)    Thoracic aortic ectasia (HCC) 01/13/2021   a.) TTE 01/13/2021: Ao root 38 mm, asc Ao 39 mm   Past Surgical History:  Procedure Laterality Date   ATRIAL FIBRILLATION ABLATION  09/22/2015   ATRIAL FIBRILLATION ABLATION N/A 04/28/2022   Procedure: ATRIAL FIBRILLATION ABLATION;  Surgeon: Regan Lemming, MD;  Location: [facility];  Service: Cardiovascular;  Laterality: N/A;   CATARACT EXTRACTION Bilateral    CYST EXCISION  1996-97   surgery back of head    ELECTROPHYSIOLOGIC STUDY N/A 09/22/2015   Procedure: Atrial Fibrillation Ablation;  Surgeon: Will Jorja Loa, MD;  Location: [facility];  Service: Cardiovascular;  Laterality: N/A;   ELECTROPHYSIOLOGIC STUDY N/A 12/10/2015   Procedure: Atrial Fibrillation Ablation;  Surgeon: Will Jorja Loa, MD;  Location: [facility];  Service: Cardiovascular;  Laterality: N/A;   ELECTROPHYSIOLOGIC STUDY  N/A 12/11/2015   Procedure: Cardioversion;  Surgeon: Will Jorja Loa, MD;  Location: [facility];  Service: Cardiovascular;  Laterality: N/A;   EMBOLIZATION (CATH LAB) Right 08/19/2020   Procedure: EMBOLIZATION;  Surgeon: Leonie Douglas, MD;  Location: [facility];  Service: Cardiovascular;  Laterality: Right;  hypogastric   EXCISION MASS HEAD N/A 01/06/2017   Procedure: EXCISION MASS FOREHEAD;  Surgeon: Glenna Fellows, MD;  Location: [facility];  Service: Plastics;  Laterality: N/A;   EXCISION MASS UPPER EXTREMETIES Right 08/08/2022   Procedure: EXCISION MASS RIGHT FOREARM;  Surgeon: Betha Loa, MD;   Location: [facility];  Service: Orthopedics;  Laterality: Right;  45 MIN   GANGLION CYST EXCISION Left    INTERCOSTAL NERVE BLOCK  07/19/2003   KNEE ARTHROSCOPY Right 07/18/2014   MASS EXCISION N/A 01/25/2021   Procedure: EXCISION SUBCUTANEOUS VS SUBFASCIAL MASS TORSO 3CM;  Surgeon: Glenna Fellows, MD;  Location: [facility];  Service: Plastics;  Laterality: N/A;   PILONIDAL CYST EXCISION N/A 09/13/2022   Procedure: CYST EXCISION PILONIDAL EXTENSIVE;  Surgeon: Henrene Dodge, MD;  Location: [facility];  Service: General;  Laterality: N/A;   SHOULDER ARTHROSCOPY WITH ROTATOR CUFF REPAIR AND OPEN BICEPS TENODESIS Left 07/26/2023   Procedure: LEFT SHOULDER ARTHROSCOPY WITH OPEN ROTATOR CUFF REPAIR AND OPEN BICEPS TENODESIS;  Surgeon: Eldred Manges, MD;  Location: [facility];  Service: Orthopedics;  Laterality: Left;   Patient Active Problem List   Diagnosis Date Noted   Impingement syndrome of right shoulder 10/03/2023   Tendinitis of upper biceps tendon of left shoulder 06/12/2023   Complete tear of left rotator cuff 10/28/2022   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   Pilonidal cyst 09/13/2022   Aortic atherosclerosis (HCC) 06/07/2022   Hypercoagulable state due to paroxysmal atrial fibrillation (HCC) 03/23/2021   Type 2 diabetes mellitus with hyperglycemia, without long-term current use of insulin (HCC) 02/10/2021   Neck pain on left side 03/06/2019   Musculoskeletal chest pain 07/24/2018   Asthma, mild intermittent 05/02/2018   Allergic rhinitis caused by mold 05/02/2018   Tobacco use 05/02/2018   Bilateral carpal tunnel syndrome 01/10/2018   Stroke (HCC)    Schizophrenia (HCC)    Migraine    History of nuclear stress test    History of alcohol abuse    GERD (gastroesophageal reflux disease)    Dysrhythmia    Chronic lower back pain    Anxiety    Arthritis of knee, right 04/12/2017   Bipolar disorder (HCC) 04/03/2017   Lipoma  of forehead 09/22/2016   Trigger ring finger of right hand 06/22/2016   Paroxysmal atrial fibrillation (HCC)    AF (atrial fibrillation) (HCC) 12/10/2015   Eczema 07/10/2015   History of CVA (cerebrovascular accident) 05/04/2015   Tear of medial meniscus of right knee 03/18/2015   Chronic pain of right knee 03/12/2015   Other and unspecified hyperlipidemia 11/25/2013   Nasal congestion 11/25/2013   Sinusitis, chronic 05/09/2013   Chronic low back pain 10/12/2012   Lumbar radiculopathy 10/12/2012   Hypertension associated with diabetes (HCC) 10/12/2012   Depression 10/12/2012   Insomnia 10/12/2012    PCP: Hoy Register MD   REFERRING PROVIDER: Annell Greening MD   REFERRING DIAG: [phone] (ICD-10-CM) - Complete tear of left rotator cuff, unspecified whether traumatic  THERAPY DIAG:  Acute pain of left shoulder  Status post left rotator cuff repair  Stiffness of left shoulder, not elsewhere classified  Muscle  weakness (generalized)  Rationale for Evaluation and Treatment: Rehabilitation  ONSET DATE: 07/26/23   SUBJECTIVE:        SUBJECTIVE STATEMENT: Pt reports L lateral scapular pain. He notes he is being seen today for a R shoulder consult.    PERTINENT HISTORY: HTN CVA DM Atrial fibrillation  PAIN:  Are you having pain? Yes: NPRS scale: 4/10 Pain location: deep in the shoulder joint  Pain description: sore achy and sharp Aggravating factors: holding it up, using it  Relieving factors: hot shower, massager, meds tried not to take it too much   Are you having pain? Yes: NPRS scale: 9/10 Pain location: Back and Rt shoulder is keeping me up at night.  Pain description: sore achy and sharp Aggravating factors: holding it up, using it  Relieving factors: hot shower, massager, meds tried not to take it too much    PRECAUTIONS: Other: none   RED FLAGS: None   WEIGHT BEARING RESTRICTIONS: No  FALLS:  Has patient fallen in last 6 months? 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   PLOF: Independent with household mobility without device, Independent with community mobility without device, Needs assistance with ADLs, and Leisure: has a CNA in [place]  PATIENT GOALS:Patient wants to get ready for the highway.  Get back to fishing    OBJECTIVE:  Note: Objective measures were complet ed at Evaluation unless otherwise noted. PATIENT SURVEYS:  Quick Dash 89    SENSATION: L biceps numb and tinging to fingers at times.  He reports this in the L UE as well.   POSTURE: Leans head to the L, shoulders elevated, shoulders rounded   UPPER EXTREMITY ROM:   Passive ROM Right eval Left eval Left 09/21/2023 Rt 10/12/23 Lt 10/12/23  Shoulder flexion  120 140 PROM 136 90 A  Shoulder extension       Shoulder abduction  85 90 PROM 126 60 A  Shoulder adduction       Shoulder internal rotation  55   FR to L L3  Shoulder external rotation  45 60 PROM    Elbow flexion  full      Elbow extension  full      UPPER EXTREMITY MMT: NT due to post surgical. Appears to be 3-/5  MMT Right 10/12/23 Left 10/12/23  Shoulder flexion 4+/5 3-/5  Shoulder extension    Shoulder abduction 4/5 3-/5  Shoulder adduction    Shoulder internal rotation  4/5  Shoulder external rotation  3+/5  Middle trapezius    Lower trapezius    Elbow flexion    Elbow extension    Wrist flexion    Wrist extension    Wrist ulnar deviation    Wrist radial deviation    Wrist pronation    Wrist supination    Grip strength (lbs)    (Blank rows = not tested)  SHOULDER SPECIAL TESTS: NT  JOINT MOBILITY TESTING:  Stiffness   PALPATION:  In TTP along Lt posterior and lateral shoulder  TREATMENT DATE:  [facility] Adult PT Treatment:              13 wks s/p Sx                                  DATE: 10/25/23 Therapeutic Activity: ROM and mobility, pain control  Pulleys scaption and overhead 2 min each during subjective  L shoulder row GTB 2x15 L shoulder ext GTB 2x10 Supine shoulder flex x10 S/L shoulder ER 3x10 1# S/L shoulder abd 1x10 thumb up; 2x10 palm down Inclined shoulder flexion at 60d 2x10  [facility] Adult PT Treatment:                                                DATE: 10/12/23 Therapeutic Activity: ROM and mobility, pain control  Pulleys scaption and overhead 2 min each during subjective  Supine scapular retraction AAROM ER, chest press and OH flexion to tolerance x 15 each  Wall slide AAROM flexion bilateral 1 min  Seated AROM Rt and L UE  Standing abd yellow band bilateral  Standing ER yellow with chest press  Flexion with yellow loop 0-70 deg  Modalities: MHP 12 min  Manual Therapy: Soft tissue to L biceps, ant, mid deltoid and L upper trap    [facility] Adult PT Treatment:                                                DATE: 10/05/23 Therapeutic Activity: Seated  Table slides flexion, scaption, abduction, ER  Deltoid strength (isometric and mid range lift off from high mat table)  IR hand on ball , ER elbow on ball x 10 each  Seated flexion L UE AROM x 10 (long arm)  Seated abduction x 10 (long arm)  Pulley overhead 3 min  Modalities:  Cold pack L UE 8 min    PATIENT EDUCATION: Education details: HEP review Person educated: Patient Education method: Programmer, multimedia, Facilities manager, Verbal cues Education comprehension: verbalized understanding and needs further education  HOME EXERCISE PROGRAM: Access Code: [email or web address] Access Code: [email or web address] URL: [email or web address] Date: 10/02/2023 Prepared by: Karie Mainland  Exercises - Supine Shoulder Press AAROM in Abduction with Dowel  - 1-3 x  daily - 7 x weekly - 2 sets - 10 reps - 5 hold - Supine Shoulder Flexion Extension AAROM with Dowel  - 1-3 x daily - 7 x weekly - 2 sets - 10 reps - 5 hold - Supine Shoulder External Rotation with Dowel  - 1-3 x daily - 7 x weekly - 2 sets - 10 reps - 5 hold - Seated Scapular Retraction  - 1-3 x daily - 7 x weekly - 2 sets - 10 reps - 5 hold - Standing Shoulder Row with Anchored Resistance  - 1 x daily - 7 x weekly - 2 sets - 10 reps - 5 hold - Shoulder extension with resistance - Neutral  - 1 x daily - 7 x weekly - 2 sets - 10 reps - 5 hold - added looped band for flexion    ASSESSMENT:  CLINICAL IMPRESSION: Pt was completed for L shoulder ROM and strengthening of the RC and periscapular  muscles. At a 60d incline, pt is able to complete active L shoulder flexion. In standing, active L shoulder flexion is limited to 90d. Pt tolerated PT today without adverse effects. Pt will continue to benefit from skilled PT to address impairments for improved L shoulder function.  EVAL: Patient continues to have significant pain in both of his shoulders.  He is limited in AROM of L UE see above.  He is able to work on shoulder strengthening despite pain levels of 8/10.  He is diligent about his HEP.  Pt reports Dr. Ophelia Charter told him who else would be seeing him at the office as he is retiring bit he does not recall who. PT will reach out for the patient to get more info on this.  He plans to use his TENS unit and is looking forward to getting dry needling in the coming weeks for pain relief.   EVAL: Patient is a 63 y.o. male who was seen today for physical therapy evaluation and treatment for L shoulder surgery. He reports he has been working on moving his shoulder at home.  He is 6 weeks out from surgery.  He will transition to light strengthening but will ensure he is following MD protocol.   OBJECTIVE IMPAIRMENTS: cardiopulmonary status limiting activity, decreased mobility, decreased ROM, decreased strength,  hypomobility, increased fascial restrictions, impaired flexibility, impaired sensation, impaired UE functional use, improper body mechanics, postural dysfunction, obesity, and pain.   ACTIVITY LIMITATIONS: carrying, lifting, sitting, standing, sleeping, stairs, bed mobility, bathing, dressing, reach over head, hygiene/grooming, locomotion level, and caring for others  PARTICIPATION LIMITATIONS: meal prep, cleaning, interpersonal relationship, driving, shopping, community activity, yard work, and recreation  PERSONAL FACTORS: Education, Past/current experiences, Social background, and 3+ comorbidities: HTN, CVA , DM   are also affecting patient's functional outcome.    GOALS: Goals reviewed with patient? Yes  SHORT TERM GOALS: Target date: 10/09/2023  Pt will be I with HEP for shoulder A/AROM, strength  Baseline:given on eval  Goal status: met  2.  Pt will be able to use L arm for dressing and bathing with min difficulty.  Baseline: needs A  Goal status: ongoing   3.  Pt will note pain at rest, no more than min for recreation, meals Baseline: min to mod pain  Goal status:ongoing   4.  Pt will be able to use pillows/ice/heat appropriately for pain mgmt at home.  Baseline: needs reinforcement  Goal status:met  LONG TERM GOALS: Target date: 11/06/2023   Pt will be I with HEP for final HEP upon discharge Baseline: unknown  Goal status: INITIAL  2.  Pt will be able to carry 1-3 light items in his L UE down by his side (groceries) without increased pain  Baseline: unable Goal status: INITIAL  3.  Pt will be able to improve ability to sleep/rest with only min disturbance from sleep.  Baseline: severely limited  Goal status: INITIAL  4.  Pt will be able to report no sensory symptoms in LUE  Baseline: moderate N/T in L UE  Goal status: INITIAL  5.  LEFS will improve to 75% or better  Baseline: 89% Goal status: INITIAL   PLAN: PT FREQUENCY: 2x/week  PT DURATION: 8  weeks  PLANNED INTERVENTIONS: 97164- PT Re-evaluation, 97110-Therapeutic exercises, 97530- Therapeutic activity, 97112- Neuromuscular re-education, 97535- Self Care, 16109- Manual therapy, Patient/Family education, Taping, Dry Needling, Joint mobilization, Cryotherapy, and Moist heat  PLAN FOR NEXT SESSION: Add to HEP. Cont.  AAROM , Light strength.  Armonee Bojanowski MS, PT 10/25/23 1:12 PM

## 2023-10-24 DIAGNOSIS — I639 Cerebral infarction, unspecified: Secondary | ICD-10-CM | POA: Diagnosis not present

## 2023-10-25 ENCOUNTER — Other Ambulatory Visit (INDEPENDENT_AMBULATORY_CARE_PROVIDER_SITE_OTHER): Payer: Self-pay

## 2023-10-25 ENCOUNTER — Ambulatory Visit: Admitting: Orthopedic Surgery

## 2023-10-25 ENCOUNTER — Ambulatory Visit: Attending: Orthopaedic Surgery

## 2023-10-25 DIAGNOSIS — M25511 Pain in right shoulder: Secondary | ICD-10-CM

## 2023-10-25 DIAGNOSIS — M25512 Pain in left shoulder: Secondary | ICD-10-CM | POA: Diagnosis not present

## 2023-10-25 DIAGNOSIS — Z9889 Other specified postprocedural states: Secondary | ICD-10-CM | POA: Diagnosis present

## 2023-10-25 DIAGNOSIS — M25612 Stiffness of left shoulder, not elsewhere classified: Secondary | ICD-10-CM | POA: Diagnosis present

## 2023-10-25 DIAGNOSIS — I639 Cerebral infarction, unspecified: Secondary | ICD-10-CM | POA: Diagnosis not present

## 2023-10-25 DIAGNOSIS — M6281 Muscle weakness (generalized): Secondary | ICD-10-CM | POA: Diagnosis present

## 2023-10-25 MED ORDER — ACETAMINOPHEN-CODEINE 300-30 MG PO TABS: ORAL_TABLET | ORAL | 0 refills | Status: DC

## 2023-10-25 NOTE — Therapy (Signed)
 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)    a.) s/p embolization 08/19/2020: 29 mm RIGHT internal iliac artery aneurysm   Anxiety    Aortic atherosclerosis (HCC)    Bilateral carpal tunnel syndrome 01/10/2018   Bipolar disorder (HCC)    Chorioretinal inflammation of both eyes    a.) on azothioprine   Chronic lower back pain    Coronary artery calcification seen on CT scan    a.) cCTA 04/21/2022: Ca score 20.6 (61st percentile for age/sex match control)   DDD (degenerative disc disease), cervical    Depression    Diastolic dysfunction    a.) TTE 01/13/2021: EF 60-65%, mod LVH, triv MR, G1DD   GERD (gastroesophageal reflux disease)    Hepatic steatosis    History of alcohol abuse    History of nuclear stress test    Myoview 10/16: EF 50%, diaphragmatic attenuation, no ischemia, low risk   Hypertension    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   a.) CT head 12/25/2012 --> RIGHT basal ganglia hypoattenuation related to remote lacunar infarct   Lipoma    Long term (current) use of anticoagulants    a.) apixaban   Long-term current use of immunomodulator    a.) on azothioprine for peripheral focal chorioretinal inflammation (both eyes)   Marijuana use    Migraine    Moderate persistent asthma with acute exacerbation 05/02/2018   PAF (paroxysmal atrial fibrillation) (HCC) 03/27/2015   a.) CHA2DS2VASc = 5 (HTN, CVA x2, vascular disease history, T2DM);  b.) s/p ablation 10/02/2015; c.) s/p ablation 12/10/2015; d.) s/p DCCV (200 J x 1) 12/11/2015; e.) s/p ablation 04/28/2022; f.) rate/rhythm maintained on oral diltiazem + carvedilol; chronically anticoagulated with apixaban   Pilonidal cyst    Schizophrenia (HCC)    Sciatica neuralgia    T2DM (type 2  diabetes mellitus) (HCC)    Thoracic aortic ectasia (HCC) 01/13/2021   a.) TTE 01/13/2021: Ao root 38 mm, asc Ao 39 mm   Past Surgical History:  Procedure Laterality Date   ATRIAL FIBRILLATION ABLATION  09/22/2015   ATRIAL FIBRILLATION ABLATION N/A 04/28/2022   Procedure: ATRIAL FIBRILLATION ABLATION;  Surgeon: Regan Lemming, MD;  Location: [facility];  Service: Cardiovascular;  Laterality: N/A;   CATARACT EXTRACTION Bilateral    CYST EXCISION  1996-97   surgery back of head    ELECTROPHYSIOLOGIC STUDY N/A 09/22/2015   Procedure: Atrial Fibrillation Ablation;  Surgeon: Will Jorja Loa, MD;  Location: [facility];  Service: Cardiovascular;  Laterality: N/A;   ELECTROPHYSIOLOGIC STUDY N/A 12/10/2015   Procedure: Atrial Fibrillation Ablation;  Surgeon: Will Jorja Loa, MD;  Location: [facility];  Service: Cardiovascular;  Laterality: N/A;   ELECTROPHYSIOLOGIC STUDY N/A 12/11/2015   Procedure: Cardioversion;  Surgeon: Will Jorja Loa, MD;  Location: [facility];  Service: Cardiovascular;  Laterality: N/A;   EMBOLIZATION (CATH LAB) Right 08/19/2020   Procedure: EMBOLIZATION;  Surgeon: Leonie Douglas, MD;  Location: [facility];  Service: Cardiovascular;  Laterality: Right;  hypogastric   EXCISION MASS HEAD N/A 01/06/2017   Procedure: EXCISION MASS FOREHEAD;  Surgeon: Glenna Fellows, MD;  Location: [facility];  Service: Plastics;  Laterality: N/A;   EXCISION  MASS UPPER EXTREMETIES Right 08/08/2022   Procedure: EXCISION MASS RIGHT FOREARM;  Surgeon: Betha Loa, MD;  Location: [facility];  Service: Orthopedics;  Laterality: Right;  45 MIN   GANGLION CYST EXCISION Left    INTERCOSTAL NERVE BLOCK  07/19/2003   KNEE ARTHROSCOPY Right 07/18/2014   MASS EXCISION N/A 01/25/2021   Procedure: EXCISION SUBCUTANEOUS VS SUBFASCIAL MASS TORSO 3CM;  Surgeon: Glenna Fellows, MD;  Location: [facility];   Service: Plastics;  Laterality: N/A;   PILONIDAL CYST EXCISION N/A 09/13/2022   Procedure: CYST EXCISION PILONIDAL EXTENSIVE;  Surgeon: Henrene Dodge, MD;  Location: [facility];  Service: General;  Laterality: N/A;   SHOULDER ARTHROSCOPY WITH ROTATOR CUFF REPAIR AND OPEN BICEPS TENODESIS Left 07/26/2023   Procedure: LEFT SHOULDER ARTHROSCOPY WITH OPEN ROTATOR CUFF REPAIR AND OPEN BICEPS TENODESIS;  Surgeon: Eldred Manges, MD;  Location: [facility];  Service: Orthopedics;  Laterality: Left;   Patient Active Problem List   Diagnosis Date Noted   Impingement syndrome of right shoulder 10/03/2023   Tendinitis of upper biceps tendon of left shoulder 06/12/2023   Complete tear of left rotator cuff 10/28/2022   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   Pilonidal cyst 09/13/2022   Aortic atherosclerosis (HCC) 06/07/2022   Hypercoagulable state due to paroxysmal atrial fibrillation (HCC) 03/23/2021   Type 2 diabetes mellitus with hyperglycemia, without long-term current use of insulin (HCC) 02/10/2021   Neck pain on left side 03/06/2019   Musculoskeletal chest pain 07/24/2018   Asthma, mild intermittent 05/02/2018   Allergic rhinitis caused by mold 05/02/2018   Tobacco use 05/02/2018   Bilateral carpal tunnel syndrome 01/10/2018   Stroke (HCC)    Schizophrenia (HCC)    Migraine    History of nuclear stress test    History of alcohol abuse    GERD (gastroesophageal reflux disease)    Dysrhythmia    Chronic lower back pain    Anxiety    Arthritis of knee, right 04/12/2017   Bipolar disorder (HCC) 04/03/2017   Lipoma of forehead 09/22/2016   Trigger ring finger of right hand 06/22/2016   Paroxysmal atrial fibrillation (HCC)    AF (atrial fibrillation) (HCC) 12/10/2015   Eczema 07/10/2015   History of CVA (cerebrovascular accident) 05/04/2015   Tear of medial meniscus of right knee 03/18/2015   Chronic pain of right knee 03/12/2015   Other and unspecified hyperlipidemia  11/25/2013   Nasal congestion 11/25/2013   Sinusitis, chronic 05/09/2013   Chronic low back pain 10/12/2012   Lumbar radiculopathy 10/12/2012   Hypertension associated with diabetes (HCC) 10/12/2012   Depression 10/12/2012   Insomnia 10/12/2012    PCP: Hoy Register MD   REFERRING PROVIDER: Annell Greening MD   REFERRING DIAG: [phone] (ICD-10-CM) - Complete tear of left rotator cuff, unspecified whether traumatic  THERAPY DIAG:  No diagnosis found.  Rationale for Evaluation and Treatment: Rehabilitation  ONSET DATE: 07/26/23   SUBJECTIVE:        SUBJECTIVE STATEMENT: Pt reports L lateral scapular pain. He notes he is being seen today for a R shoulder consult.    PERTINENT HISTORY: HTN CVA DM Atrial fibrillation  PAIN:  Are you having pain? Yes: NPRS scale: 4/10 Pain location: deep in the shoulder joint  Pain description: sore achy and sharp Aggravating factors: holding it up, using it  Relieving factors: hot shower, massager, meds tried not to take it too much   Are you having pain? Yes: NPRS scale: 9/10  Pain location: Back and Rt shoulder is keeping me up at night.  Pain description: sore achy and sharp Aggravating factors: holding it up, using it  Relieving factors: hot shower, massager, meds tried not to take it too much    PRECAUTIONS: Other: none   RED FLAGS: None   WEIGHT BEARING RESTRICTIONS: No  FALLS:  Has patient fallen in last 6 months? 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   PLOF: Independent with household mobility without device, Independent with community mobility without device, Needs assistance with ADLs, and Leisure: has a CNA in [place]  PATIENT GOALS:Patient wants to get ready for the highway.  Get back to  fishing    OBJECTIVE:  Note: Objective measures were complet ed at Evaluation unless otherwise noted. PATIENT SURVEYS:  Quick Dash 89    SENSATION: L biceps numb and tinging to fingers at times.  He reports this in the L UE as well.   POSTURE: Leans head to the L, shoulders elevated, shoulders rounded   UPPER EXTREMITY ROM:   Passive ROM Right eval Left eval Left 09/21/2023 Rt 10/12/23 Lt 10/12/23  Shoulder flexion  120 140 PROM 136 90 A  Shoulder extension       Shoulder abduction  85 90 PROM 126 60 A  Shoulder adduction       Shoulder internal rotation  55   FR to L L3  Shoulder external rotation  45 60 PROM    Elbow flexion  full      Elbow extension  full      UPPER EXTREMITY MMT: NT due to post surgical. Appears to be 3-/5  MMT Right 10/12/23 Left 10/12/23  Shoulder flexion 4+/5 3-/5  Shoulder extension    Shoulder abduction 4/5 3-/5  Shoulder adduction    Shoulder internal rotation  4/5  Shoulder external rotation  3+/5  Middle trapezius    Lower trapezius    Elbow flexion    Elbow extension    Wrist flexion    Wrist extension    Wrist ulnar deviation    Wrist radial deviation    Wrist pronation    Wrist supination    Grip strength (lbs)    (Blank rows = not tested)  SHOULDER SPECIAL TESTS: NT  JOINT MOBILITY TESTING:  Stiffness   PALPATION:  In TTP along Lt posterior and lateral shoulder                                                                                                                              TREATMENT DATE:  [facility] Adult PT Treatment:                13 wks s/p Sx  DATE: 4/11025 Therapeutic Activity: ROM and mobility, pain control  Pulleys scaption and overhead 2 min each during subjective  L shoulder row GTB 2x15 L shoulder ext GTB 2x10 Supine shoulder flex x10 S/L shoulder ER 3x10 1# S/L shoulder abd 1x10 thumb up; 2x10 palm down Inclined shoulder flexion at 60d 2x10 Therapeutic  Exercise: *** Manual Therapy: *** Neuromuscular re-ed: *** Therapeutic Activity: *** Modalities: *** Self Care: ***   [facility] Adult PT Treatment:              13 wks s/p Sx                                  DATE: 10/25/23 Therapeutic Activity: ROM and mobility, pain control  Pulleys scaption and overhead 2 min each during subjective  L shoulder row GTB 2x15 L shoulder ext GTB 2x10 Supine shoulder flex x10 S/L shoulder ER 3x10 1# S/L shoulder abd 1x10 thumb up; 2x10 palm down Inclined shoulder flexion at 60d 2x10  [facility] Adult PT Treatment:                                                DATE: 10/12/23 Therapeutic Activity: ROM and mobility, pain control  Pulleys scaption and overhead 2 min each during subjective  Supine scapular retraction AAROM ER, chest press and OH flexion to tolerance x 15 each  Wall slide AAROM flexion bilateral 1 min  Seated AROM Rt and L UE  Standing abd yellow band bilateral  Standing ER yellow with chest press  Flexion with yellow loop 0-70 deg  Modalities: MHP 12 min  Manual Therapy: Soft tissue to L biceps, ant, mid deltoid and L upper trap    [facility] Adult PT Treatment:                                                DATE: 10/05/23 Therapeutic Activity: Seated  Table slides flexion, scaption, abduction, ER  Deltoid strength (isometric and mid range lift off from high mat table)  IR hand on ball , ER elbow on ball x 10 each  Seated flexion L UE AROM x 10 (long arm)  Seated abduction x 10 (long arm)  Pulley overhead 3 min  Modalities:  Cold pack L UE 8 min    PATIENT EDUCATION: Education details: HEP review Person educated: Patient Education method: Programmer, multimedia, Facilities manager, Verbal cues Education comprehension: verbalized understanding and needs further education  HOME EXERCISE PROGRAM: Access Code: [email or web address] Access Code: [email or web address] URL: [email or web address] Date: 10/02/2023 Prepared by: Karie Mainland  Exercises - Supine  Shoulder Press AAROM in Abduction with Dowel  - 1-3 x daily - 7 x weekly - 2 sets - 10 reps - 5 hold - Supine Shoulder Flexion Extension AAROM with Dowel  - 1-3 x daily - 7 x weekly - 2 sets - 10 reps - 5 hold - Supine Shoulder External Rotation with Dowel  - 1-3 x daily - 7 x weekly - 2 sets - 10 reps - 5 hold - Seated Scapular Retraction  - 1-3 x daily - 7 x weekly - 2 sets - 10 reps - 5 hold - Standing Shoulder Row with Anchored  Resistance  - 1 x daily - 7 x weekly - 2 sets - 10 reps - 5 hold - Shoulder extension with resistance - Neutral  - 1 x daily - 7 x weekly - 2 sets - 10 reps - 5 hold - added looped band for flexion    ASSESSMENT:  CLINICAL IMPRESSION: Pt was completed for L shoulder ROM and strengthening of the RC and periscapular muscles. At a 60d incline, pt is able to complete active L shoulder flexion. In standing, active L shoulder flexion is limited to 90d. Pt tolerated PT today without adverse effects. Pt will continue to benefit from skilled PT to address impairments for improved L shoulder function.  EVAL: Patient continues to have significant pain in both of his shoulders.  He is limited in AROM of L UE see above.  He is able to work on shoulder strengthening despite pain levels of 8/10.  He is diligent about his HEP.  Pt reports Dr. Ophelia Charter told him who else would be seeing him at the office as he is retiring bit he does not recall who. PT will reach out for the patient to get more info on this.  He plans to use his TENS unit and is looking forward to getting dry needling in the coming weeks for pain relief.   EVAL: Patient is a 63 y.o. male who was seen today for physical therapy evaluation and treatment for L shoulder surgery. He reports he has been working on moving his shoulder at home.  He is 6 weeks out from surgery.  He will transition to light strengthening but will ensure he is following MD protocol.   OBJECTIVE IMPAIRMENTS: cardiopulmonary status limiting activity,  decreased mobility, decreased ROM, decreased strength, hypomobility, increased fascial restrictions, impaired flexibility, impaired sensation, impaired UE functional use, improper body mechanics, postural dysfunction, obesity, and pain.   ACTIVITY LIMITATIONS: carrying, lifting, sitting, standing, sleeping, stairs, bed mobility, bathing, dressing, reach over head, hygiene/grooming, locomotion level, and caring for others  PARTICIPATION LIMITATIONS: meal prep, cleaning, interpersonal relationship, driving, shopping, community activity, yard work, and recreation  PERSONAL FACTORS: Education, Past/current experiences, Social background, and 3+ comorbidities: HTN, CVA , DM   are also affecting patient's functional outcome.    GOALS: Goals reviewed with patient? Yes  SHORT TERM GOALS: Target date: 10/09/2023  Pt will be I with HEP for shoulder A/AROM, strength  Baseline:given on eval  Goal status: met  2.  Pt will be able to use L arm for dressing and bathing with min difficulty.  Baseline: needs A  Goal status: ongoing   3.  Pt will note pain at rest, no more than min for recreation, meals Baseline: min to mod pain  Goal status:ongoing   4.  Pt will be able to use pillows/ice/heat appropriately for pain mgmt at home.  Baseline: needs reinforcement  Goal status:met  LONG TERM GOALS: Target date: 11/06/2023   Pt will be I with HEP for final HEP upon discharge Baseline: unknown  Goal status: INITIAL  2.  Pt will be able to carry 1-3 light items in his L UE down by his side (groceries) without increased pain  Baseline: unable Goal status: INITIAL  3.  Pt will be able to improve ability to sleep/rest with only min disturbance from sleep.  Baseline: severely limited  Goal status: INITIAL  4.  Pt will be able to report no sensory symptoms in LUE  Baseline: moderate N/T in L UE  Goal status: INITIAL  5.  LEFS will improve to 75% or better  Baseline: 89% Goal status:  INITIAL   PLAN: PT FREQUENCY: 2x/week  PT DURATION: 8 weeks  PLANNED INTERVENTIONS: 97164- PT Re-evaluation, 97110-Therapeutic exercises, 97530- Therapeutic activity, 97112- Neuromuscular re-education, 97535- Self Care, 98119- Manual therapy, Patient/Family education, Taping, Dry Needling, Joint mobilization, Cryotherapy, and Moist heat  PLAN FOR NEXT SESSION: Add to HEP. Cont.  AAROM , Light strength.     Lajoyce Tamura MS, PT 10/25/23 2:08 PM

## 2023-10-26 ENCOUNTER — Ambulatory Visit (INDEPENDENT_AMBULATORY_CARE_PROVIDER_SITE_OTHER): Payer: Self-pay

## 2023-10-26 DIAGNOSIS — J309 Allergic rhinitis, unspecified: Secondary | ICD-10-CM

## 2023-10-26 DIAGNOSIS — I639 Cerebral infarction, unspecified: Secondary | ICD-10-CM | POA: Diagnosis not present

## 2023-10-27 ENCOUNTER — Ambulatory Visit

## 2023-10-27 ENCOUNTER — Encounter: Payer: Self-pay | Admitting: Orthopedic Surgery

## 2023-10-27 DIAGNOSIS — I639 Cerebral infarction, unspecified: Secondary | ICD-10-CM | POA: Diagnosis not present

## 2023-10-27 DIAGNOSIS — M25512 Pain in left shoulder: Secondary | ICD-10-CM

## 2023-10-27 DIAGNOSIS — Z9889 Other specified postprocedural states: Secondary | ICD-10-CM

## 2023-10-27 DIAGNOSIS — M6281 Muscle weakness (generalized): Secondary | ICD-10-CM

## 2023-10-27 NOTE — Progress Notes (Signed)
 Office Visit Note   [name]           Date of Birth: 18-Aug-1960           MRN: [id number] Visit Date: 10/25/2023 Requested by: Hoy Register, MD [address] [place],  [place] [address] PCP: Hoy Register, MD  Subjective: Chief Complaint  Patient presents with   Left Shoulder - Pain   Right Shoulder - Pain    HPI: Jason Stokes is a 63 y.o. male who presents to the office reporting bilateral shoulder pain.  Patient underwent left shoulder arthroscopy with rotator cuff tear repair and biceps tenodesis 07/26/2023.  Not taking pain meds.  Going to therapy twice a week.  Improving but his shoulder hurts.  He also reports right shoulder pain for the last 6 months.  Did have a subacromial injection on 10/03/2023 which helped for about 2 weeks.  He is not working.  He has done physical therapy for the left shoulder and done similar exercises for the right shoulder.  Right shoulder pain actually keeps him up from sleep at night.  Has occasional radiation to his fingertips.              ROS: All systems reviewed are negative as they relate to the chief complaint within the history of present illness.  Patient denies fevers or chills.  Assessment & Plan: Visit Diagnoses:  1. Bilateral shoulder pain, unspecified chronicity   2. Right shoulder pain, unspecified chronicity     Plan: Impression is moderately stiff but functional left shoulder following rotator cuff tear repair.  Right shoulder is more symptomatic at this time although it has maintained some ability.  Cuff pathology is likely based on pathology present in the left-hand side.  Failed conservative management with symptoms ongoing for 3 to 6 months.  Tylenol 3 prescribed.  He is on Eliquis.  MRI arthrogram right shoulder indicated to evaluate for rotator cuff tear and biceps tendinosis.  Follow-up after that study.  Follow-Up Instructions: No follow-ups on file.   Orders:  Orders Placed This Encounter   Procedures   XR Shoulder Right   XR Shoulder Left   MR SHOULDER RIGHT W CONTRAST   Arthrogram   Meds ordered this encounter  Medications   acetaminophen-codeine (TYLENOL #3) 300-30 MG tablet    Sig: 1 po q 8hrs prn pain    Dispense:  30 tablet    Refill:  0      Procedures: No procedures performed   Clinical Data: No additional findings.  Objective: Vital Signs: There were no vitals taken for this visit.  Physical Exam:  Constitutional: Patient appears well-developed HEENT:  Head: Normocephalic Eyes:EOM are normal Neck: Normal range of motion Cardiovascular: Normal rate Pulmonary/chest: Effort normal Neurologic: Patient is alert Skin: Skin is warm Psychiatric: Patient has normal mood and affect  Ortho Exam: Ortho exam demonstrates range of motion on the left of 30/80/120.  Does have pretty good strength to infraspinatus and supraspinatus and subscap testing.  Does have a little bit of coarseness and popping with internal and external rotation of that left shoulder at 90 degrees of abduction.  On the right-hand side patient has positive O'Brien's and speeds testing.  The rotator cuff strength demonstrate supraspinatus and subscap muscle testing but he has some but less crepitus on the right compared to the left with internal and external rotation at 90 degrees of abduction.  Cervical spine range of motion intact.  Specialty Comments:  CLINICAL DATA:  Chronic neck pain   EXAM: CT CERVICAL SPINE WITHOUT CONTRAST   TECHNIQUE: Multidetector CT imaging of the cervical spine was performed without intravenous contrast. Multiplanar CT image reconstructions were also generated.   RADIATION DOSE REDUCTION: This exam was performed according to the departmental dose-optimization program which includes automated exposure control, adjustment of the mA and/or kV according to patient size and/or use of iterative reconstruction technique.   COMPARISON:  07/16/2022, 05/15/2022    FINDINGS: Alignment: Facet joints are aligned without dislocation or traumatic listhesis. Dens and lateral masses are aligned.   Skull base and vertebrae: No acute fracture. No primary bone lesion or focal pathologic process.   Soft tissues and spinal canal: No prevertebral fluid or swelling. No visible canal hematoma.   Disc levels: Degenerative disc disease most pronounced at the C3-4 and C5-6 levels, without interval progression from recent previous CT and MRI.   Upper chest: Negative.   Other: None.   IMPRESSION: 1. No acute fracture or traumatic listhesis of the cervical spine. 2. 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     Electronically Signed   By: Duanne Guess D.O.   On: 07/27/2022 14:03  Imaging: No results found.   PMFS History: Patient Active Problem List   Diagnosis Date Noted   Impingement syndrome of right shoulder 10/03/2023   Tendinitis of upper biceps tendon of left shoulder 06/12/2023   Complete tear of left rotator cuff 10/28/2022   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   Pilonidal cyst 09/13/2022   Aortic atherosclerosis (HCC) 06/07/2022   Hypercoagulable state due to paroxysmal atrial fibrillation (HCC) 03/23/2021   Type 2 diabetes mellitus with hyperglycemia, without long-term current use of insulin (HCC) 02/10/2021   Neck pain on left side 03/06/2019   Musculoskeletal chest pain 07/24/2018   Asthma, mild intermittent 05/02/2018   Allergic rhinitis caused by mold 05/02/2018   Tobacco use 05/02/2018   Bilateral carpal tunnel syndrome 01/10/2018   Stroke (HCC)    Schizophrenia (HCC)    Migraine    History of nuclear stress test    History of alcohol abuse    GERD (gastroesophageal reflux disease)    Dysrhythmia    Chronic lower back pain    Anxiety    Arthritis of knee, right 04/12/2017   Bipolar disorder (HCC) 04/03/2017    Lipoma of forehead 09/22/2016   Trigger ring finger of right hand 06/22/2016   Paroxysmal atrial fibrillation (HCC)    AF (atrial fibrillation) (HCC) 12/10/2015   Eczema 07/10/2015   History of CVA (cerebrovascular accident) 05/04/2015   Tear of medial meniscus of right knee 03/18/2015   Chronic pain of right knee 03/12/2015   Other and unspecified hyperlipidemia 11/25/2013   Nasal congestion 11/25/2013   Sinusitis, chronic 05/09/2013   Chronic low back pain 10/12/2012   Lumbar radiculopathy 10/12/2012   Hypertension associated with diabetes (HCC) 10/12/2012   Depression 10/12/2012   Insomnia 10/12/2012   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)    a.) s/p embolization 08/19/2020: 29 mm RIGHT internal iliac artery aneurysm   Anxiety    Aortic atherosclerosis (HCC)    Bilateral carpal tunnel syndrome 01/10/2018   Bipolar disorder (HCC)    Chorioretinal inflammation of both eyes    a.) on azothioprine   Chronic lower back pain    Coronary artery  calcification seen on CT scan    a.) cCTA 04/21/2022: Ca score 20.6 (61st percentile for age/sex match control)   DDD (degenerative disc disease), cervical    Depression    Diastolic dysfunction    a.) TTE 01/13/2021: EF 60-65%, mod LVH, triv MR, G1DD   GERD (gastroesophageal reflux disease)    Hepatic steatosis    History of alcohol abuse    History of nuclear stress test    Myoview 10/16: EF 50%, diaphragmatic attenuation, no ischemia, low risk   Hypertension    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   a.) CT head 12/25/2012 --> RIGHT basal ganglia hypoattenuation related to remote lacunar infarct   Lipoma    Long term (current) use of anticoagulants    a.) apixaban   Long-term current use of immunomodulator    a.) on azothioprine for peripheral focal chorioretinal inflammation (both eyes)   Marijuana use    Migraine    Moderate persistent asthma with acute exacerbation  05/02/2018   PAF (paroxysmal atrial fibrillation) (HCC) 03/27/2015   a.) CHA2DS2VASc = 5 (HTN, CVA x2, vascular disease history, T2DM);  b.) s/p ablation 10/02/2015; c.) s/p ablation 12/10/2015; d.) s/p DCCV (200 J x 1) 12/11/2015; e.) s/p ablation 04/28/2022; f.) rate/rhythm maintained on oral diltiazem + carvedilol; chronically anticoagulated with apixaban   Pilonidal cyst    Schizophrenia (HCC)    Sciatica neuralgia    T2DM (type 2 diabetes mellitus) (HCC)    Thoracic aortic ectasia (HCC) 01/13/2021   a.) TTE 01/13/2021: Ao root 38 mm, asc Ao 39 mm    Family History  Problem Relation Age of Onset   Heart disease Father    Schizophrenia Sister     Past Surgical History:  Procedure Laterality Date   ATRIAL FIBRILLATION ABLATION  09/22/2015   ATRIAL FIBRILLATION ABLATION N/A 04/28/2022   Procedure: ATRIAL FIBRILLATION ABLATION;  Surgeon: Regan Lemming, MD;  Location: [facility];  Service: Cardiovascular;  Laterality: N/A;   CATARACT EXTRACTION Bilateral    CYST EXCISION  1996-97   surgery back of head    ELECTROPHYSIOLOGIC STUDY N/A 09/22/2015   Procedure: Atrial Fibrillation Ablation;  Surgeon: Will Jorja Loa, MD;  Location: [facility];  Service: Cardiovascular;  Laterality: N/A;   ELECTROPHYSIOLOGIC STUDY N/A 12/10/2015   Procedure: Atrial Fibrillation Ablation;  Surgeon: Will Jorja Loa, MD;  Location: [facility];  Service: Cardiovascular;  Laterality: N/A;   ELECTROPHYSIOLOGIC STUDY N/A 12/11/2015   Procedure: Cardioversion;  Surgeon: Will Jorja Loa, MD;  Location: [facility];  Service: Cardiovascular;  Laterality: N/A;   EMBOLIZATION (CATH LAB) Right 08/19/2020   Procedure: EMBOLIZATION;  Surgeon: Leonie Douglas, MD;  Location: [facility];  Service: Cardiovascular;  Laterality: Right;  hypogastric   EXCISION MASS HEAD N/A 01/06/2017   Procedure: EXCISION MASS FOREHEAD;  Surgeon: Glenna Fellows, MD;  Location: [facility];  Service: Plastics;  Laterality: N/A;   EXCISION MASS UPPER EXTREMETIES Right 08/08/2022   Procedure: EXCISION MASS RIGHT FOREARM;  Surgeon: Betha Loa, MD;  Location: [facility];  Service: Orthopedics;  Laterality: Right;  45 MIN   GANGLION CYST EXCISION Left    INTERCOSTAL NERVE BLOCK  07/19/2003   KNEE ARTHROSCOPY Right 07/18/2014   MASS EXCISION N/A 01/25/2021   Procedure: EXCISION SUBCUTANEOUS VS SUBFASCIAL MASS TORSO 3CM;  Surgeon: Glenna Fellows, MD;  Location: [facility];  Service: Plastics;  Laterality: N/A;   PILONIDAL CYST EXCISION  N/A 09/13/2022   Procedure: CYST EXCISION PILONIDAL EXTENSIVE;  Surgeon: Henrene Dodge, MD;  Location: [facility];  Service: General;  Laterality: N/A;   SHOULDER ARTHROSCOPY WITH ROTATOR CUFF REPAIR AND OPEN BICEPS TENODESIS Left 07/26/2023   Procedure: LEFT SHOULDER ARTHROSCOPY WITH OPEN ROTATOR CUFF REPAIR AND OPEN BICEPS TENODESIS;  Surgeon: Eldred Manges, MD;  Location: [facility];  Service: Orthopedics;  Laterality: Left;   Social History   Occupational History    Comment: disabled  Tobacco Use   Smoking status: Some Days    Current packs/day: 0.00    Types: Cigarettes    Start date: 09/10/1993    Last attempt to quit: 09/10/2022    Years since quitting: 1.1    Passive exposure: Never   Smokeless tobacco: Never   Tobacco comments:    1 cigarettes every day  06-02-2021  Vaping Use   Vaping status: Never Used  Substance and Sexual Activity   Alcohol use: Yes    Alcohol/week: 6.0 standard drinks of alcohol    Types: 6 Cans of beer per week    Comment: occasional   Drug use: Yes    Types: Marijuana    Comment: been 1 week   Sexual activity: Not Currently

## 2023-10-27 NOTE — Patient Instructions (Signed)
 Trigger Point Dry Needling  What is Trigger Point Dry Needling (DN)? DN is a physical therapy technique used to treat muscle pain and dysfunction. Specifically, DN helps deactivate muscle trigger points (muscle knots).  A thin filiform needle is used to penetrate the skin and stimulate the underlying trigger point. The goal is for a local twitch response (LTR) to occur and for the trigger point to relax. No medication of any kind is injected during the procedure.   What Does Trigger Point Dry Needling Feel Like?  The procedure feels different for each individual patient. Some patients report that they do not actually feel the needle enter the skin and overall the process is not painful. Very mild bleeding may occur. However, many patients feel a deep cramping in the muscle in which the needle was inserted. This is the local twitch response.   How Will I feel after the treatment? Soreness is normal, and the onset of soreness may not occur for a few hours. Typically this soreness does not last longer than two days.  Bruising is uncommon, however; ice can be used to decrease any possible bruising.  In rare cases feeling tired or nauseous after the treatment is normal. In addition, your symptoms may get worse before they get better, this period will typically not last longer than 24 hours.   What Can I do After My Treatment? Increase your hydration by drinking more water for the next 24 hours.  You may place ice or heat on the areas treated that have become sore, however, do not use heat on inflamed or bruised areas. Heat often brings more relief post needling. You can continue your regular activities, but vigorous activity is not recommended initially after the treatment for 24 hours. DN is best combined with other physical therapy such as strengthening, stretching, and other therapies.   What are the complications? While your therapist has had extensive training in minimizing the risks of trigger  point dry needling, it is important to understand the risks of any procedure.  Risks include bleeding, pain, fatigue, hematoma, infection, vertigo, nausea or nerve involvement. Monitor for any changes to your skin or sensation. Contact your therapist or MD with concerns.  A rare but serious complication is a pneumothorax over or near your middle and upper chest and back If you have dry needling in this area, monitor for the following symptoms: Shortness of breath on exertion and/or Difficulty taking a deep breath and/or Chest Pain and/or A dry cough If any of the above symptoms develop, please go to the nearest emergency room or call 911. Tell them you had dry needling over your thorax and report any symptoms you are having. Please follow-up with your treating therapist after you complete the medical evaluation.

## 2023-10-28 DIAGNOSIS — I639 Cerebral infarction, unspecified: Secondary | ICD-10-CM | POA: Diagnosis not present

## 2023-10-29 DIAGNOSIS — I639 Cerebral infarction, unspecified: Secondary | ICD-10-CM | POA: Diagnosis not present

## 2023-10-30 ENCOUNTER — Encounter: Payer: Self-pay | Admitting: Physical Therapy

## 2023-10-30 ENCOUNTER — Ambulatory Visit: Admitting: Physical Therapy

## 2023-10-30 DIAGNOSIS — M25512 Pain in left shoulder: Secondary | ICD-10-CM | POA: Diagnosis not present

## 2023-10-30 DIAGNOSIS — Z9889 Other specified postprocedural states: Secondary | ICD-10-CM

## 2023-10-30 DIAGNOSIS — I639 Cerebral infarction, unspecified: Secondary | ICD-10-CM | POA: Diagnosis not present

## 2023-10-30 NOTE — Therapy (Signed)
 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)    a.) s/p embolization 08/19/2020: 29 mm RIGHT internal iliac artery aneurysm   Anxiety    Aortic atherosclerosis (HCC)    Bilateral carpal tunnel syndrome 01/10/2018   Bipolar disorder (HCC)    Chorioretinal inflammation of both eyes    a.) on azothioprine   Chronic lower back pain    Coronary artery calcification seen on CT scan    a.) cCTA 04/21/2022: Ca score 20.6 (61st percentile for age/sex match control)   DDD (degenerative disc disease), cervical    Depression    Diastolic dysfunction    a.) TTE 01/13/2021: EF 60-65%, mod LVH, triv MR, G1DD   GERD (gastroesophageal reflux disease)    Hepatic steatosis    History of alcohol abuse    History of nuclear stress test    Myoview 10/16: EF 50%, diaphragmatic attenuation, no ischemia, low risk   Hypertension    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   a.) CT head 12/25/2012 --> RIGHT basal ganglia hypoattenuation related to remote lacunar infarct   Lipoma    Long term (current) use of anticoagulants    a.) apixaban   Long-term current use of immunomodulator    a.) on azothioprine for peripheral focal chorioretinal inflammation (both eyes)   Marijuana use    Migraine    Moderate persistent asthma with acute exacerbation 05/02/2018   PAF (paroxysmal atrial fibrillation) (HCC) 03/27/2015   a.) CHA2DS2VASc = 5 (HTN, CVA x2, vascular disease history, T2DM);  b.) s/p ablation 10/02/2015; c.) s/p ablation 12/10/2015; d.)  s/p DCCV (200 J x 1) 12/11/2015; e.) s/p ablation 04/28/2022; f.) rate/rhythm maintained on oral diltiazem + carvedilol; chronically anticoagulated with apixaban   Pilonidal cyst    Schizophrenia (HCC)    Sciatica neuralgia    T2DM (type 2 diabetes mellitus) (HCC)    Thoracic aortic ectasia (HCC) 01/13/2021   a.) TTE 01/13/2021: Ao root 38 mm, asc Ao 39 mm   Past Surgical History:  Procedure Laterality Date   ATRIAL FIBRILLATION ABLATION  09/22/2015   ATRIAL FIBRILLATION ABLATION N/A 04/28/2022   Procedure: ATRIAL FIBRILLATION ABLATION;  Surgeon: Lei Pump, MD;  Location: [facility];  Service: Cardiovascular;  Laterality: N/A;   CATARACT EXTRACTION Bilateral    CYST EXCISION  1996-97   surgery back of head    ELECTROPHYSIOLOGIC STUDY N/A 09/22/2015   Procedure: Atrial Fibrillation Ablation;  Surgeon: Will Cortland Ding, MD;  Location: [facility];  Service: Cardiovascular;  Laterality: N/A;   ELECTROPHYSIOLOGIC STUDY N/A 12/10/2015   Procedure: Atrial Fibrillation Ablation;  Surgeon: Will Cortland Ding, MD;  Location: [facility];  Service: Cardiovascular;  Laterality: N/A;   ELECTROPHYSIOLOGIC STUDY N/A 12/11/2015   Procedure: Cardioversion;  Surgeon: Will Cortland Ding, MD;  Location: [facility];  Service: Cardiovascular;  Laterality: N/A;   EMBOLIZATION (CATH LAB) Right 08/19/2020   Procedure: EMBOLIZATION;  Surgeon: Carlene Che, MD;  Location: [facility];  Service: Cardiovascular;  Laterality: Right;  hypogastric   EXCISION MASS HEAD N/A 01/06/2017   Procedure: EXCISION MASS FOREHEAD;  Surgeon: Alger Infield, MD;  Location: [facility];  Service: Plastics;  Laterality: N/A;   EXCISION MASS UPPER EXTREMETIES Right 08/08/2022   Procedure: EXCISION MASS RIGHT FOREARM;  Surgeon: Brunilda Capra, MD;  Location: [facility];  Service: Orthopedics;  Laterality: Right;  45 MIN   GANGLION CYST EXCISION Left     INTERCOSTAL NERVE BLOCK  07/19/2003   KNEE ARTHROSCOPY Right 07/18/2014   MASS EXCISION N/A 01/25/2021   Procedure: EXCISION SUBCUTANEOUS VS SUBFASCIAL MASS TORSO 3CM;  Surgeon: Alger Infield, MD;  Location: [facility];  Service: Plastics;  Laterality: N/A;   PILONIDAL CYST EXCISION N/A 09/13/2022   Procedure: CYST EXCISION PILONIDAL EXTENSIVE;  Surgeon: Emmalene Hare, MD;  Location: [facility];  Service: General;  Laterality: N/A;   SHOULDER ARTHROSCOPY WITH ROTATOR CUFF REPAIR AND OPEN BICEPS TENODESIS Left 07/26/2023   Procedure: LEFT SHOULDER ARTHROSCOPY WITH OPEN ROTATOR CUFF REPAIR AND OPEN BICEPS TENODESIS;  Surgeon: Adah Acron, MD;  Location: [facility];  Service: Orthopedics;  Laterality: Left;   Patient Active Problem List   Diagnosis Date Noted   Impingement syndrome of right shoulder 10/03/2023   Tendinitis of upper biceps tendon of left shoulder 06/12/2023   Complete tear of left rotator cuff 10/28/2022   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   Pilonidal cyst 09/13/2022   Aortic atherosclerosis (HCC) 06/07/2022   Hypercoagulable state due to paroxysmal atrial fibrillation (HCC) 03/23/2021   Type 2 diabetes mellitus with hyperglycemia, without long-term current use of insulin (HCC) 02/10/2021   Neck pain on left side 03/06/2019   Musculoskeletal chest pain 07/24/2018   Asthma, mild intermittent 05/02/2018   Allergic rhinitis caused by mold 05/02/2018   Tobacco use 05/02/2018   Bilateral carpal tunnel syndrome 01/10/2018   Stroke (HCC)    Schizophrenia (HCC)    Migraine    History of nuclear stress test    History of alcohol abuse    GERD (gastroesophageal reflux disease)    Dysrhythmia    Chronic lower back pain    Anxiety    Arthritis of knee, right 04/12/2017   Bipolar disorder (HCC) 04/03/2017   Lipoma of forehead 09/22/2016   Trigger ring finger of right hand 06/22/2016   Paroxysmal atrial fibrillation (HCC)    AF  (atrial fibrillation) (HCC) 12/10/2015   Eczema 07/10/2015   History of CVA (cerebrovascular accident) 05/04/2015   Tear of medial meniscus of right knee 03/18/2015   Chronic pain of right knee 03/12/2015   Other and unspecified hyperlipidemia 11/25/2013   Nasal congestion 11/25/2013   Sinusitis, chronic 05/09/2013   Chronic low back pain 10/12/2012   Lumbar radiculopathy 10/12/2012   Hypertension associated with diabetes (HCC) 10/12/2012   Depression 10/12/2012   Insomnia 10/12/2012    PCP: Joaquin Mulberry MD   REFERRING PROVIDER: Tommy Frames MD   REFERRING DIAG: [phone] (ICD-10-CM) - Complete tear of left rotator cuff, unspecified whether traumatic  THERAPY DIAG:  Acute pain of left shoulder  Status post left rotator cuff repair  Rationale for Evaluation and Treatment: Rehabilitation  ONSET DATE: 07/26/23   SUBJECTIVE:        SUBJECTIVE STATEMENT: 28th will have a MRI on  right shoulder. Will then have surgery to remove spurs.    PERTINENT HISTORY: HTN CVA DM Atrial fibrillation  PAIN:  Are you having pain? Yes: NPRS scale: 8/10 L lateral shoulder blade Pain location: deep in the shoulder joint  Pain description: sore achy and sharp Aggravating factors: holding it up, using it  Relieving factors: hot shower, massager, meds tried not to take it too much   Are you having pain? Yes: NPRS scale: 9/10 Pain location: Back and Rt shoulder is keeping me up at night.  Pain description: sore achy and sharp Aggravating factors: holding it up, using it  Relieving factors: hot shower, massager, meds tried not to take it too much    PRECAUTIONS: Other: none   RED FLAGS: None   WEIGHT BEARING RESTRICTIONS: No  FALLS:  Has patient fallen in last 6 months? 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   PLOF: Independent with household mobility without device, Independent with community mobility without device, Needs assistance with ADLs, and Leisure: has a CNA in [place]  PATIENT GOALS:Patient wants to get ready for the highway.  Get back to fishing    OBJECTIVE:  Note: Objective measures were complet ed at Evaluation unless otherwise noted. PATIENT SURVEYS:  Quick Dash 89    SENSATION: L biceps numb and tinging to fingers at times.  He reports this in the L UE as well.   POSTURE: Leans head to the L, shoulders elevated, shoulders rounded   UPPER EXTREMITY ROM:   Passive ROM Right eval Left eval Left 09/21/2023 Rt 10/12/23 Lt 10/12/23 Lt 10/27/23  Shoulder flexion  120 140 PROM 136 90 A 125d  Shoulder extension        Shoulder abduction  85 90 PROM 126 60 A 80d   Shoulder adduction        Shoulder internal rotation  55   FR to L L3   Shoulder external rotation  45 60 PROM     Elbow flexion  full       Elbow extension  full       UPPER EXTREMITY MMT: NT due to post surgical. Appears to be 3-/5  MMT Right 10/12/23 Left 10/12/23  Shoulder flexion 4+/5 3-/5  Shoulder extension    Shoulder abduction 4/5 3-/5  Shoulder adduction    Shoulder internal rotation  4/5  Shoulder external rotation  3+/5  Middle trapezius    Lower trapezius    Elbow flexion    Elbow extension    Wrist flexion    Wrist extension    Wrist ulnar deviation    Wrist radial deviation    Wrist pronation    Wrist supination    Grip strength (lbs)    (Blank rows = not tested)  SHOULDER SPECIAL TESTS: NT  JOINT MOBILITY TESTING:  Stiffness   PALPATION:  In TTP along Lt posterior and lateral shoulder  TREATMENT DATE:  [facility] Adult PT Treatment:                                                DATE:  10/30/23 Therapeutic Exercise: Row GTB Shoulder EXT GTB IR and ER 10 x 2 each Blue band  Standing shoulder flex x10 Standing shoulder abdct x 5  S/L shoulder Abdct x 10  Supine protraction 3# 15 x 2  Inclined shoulder flexion long lever 10 x 2  Inclined left shoulder horiz abduction GTB x 10 (therapist assist with band due to right shoulder pain      [facility] Adult PT Treatment:                13 wks s/p Sx                                DATE: 4/11025 Manual Therapy: STM to the L teres minor and major, and lat dorsi Skilled palpation to identify TP and taut muscle bands Trigger Point Dry Needling  Initial Treatment: Pt instructed on Dry Needling rational, procedures, and possible side effects. Pt instructed to expect mild to moderate muscle soreness later in the day and/or into the next day.  Pt instructed in methods to reduce muscle soreness. Pt instructed to continue prescribed HEP. Because Dry Needling was performed over or adjacent to a lung field, pt was educated on S/S of pneumothorax and to seek immediate medical attention should they occur.  Patient was educated on signs and symptoms of infection and other risk factors and advised to seek medical attention should they occur.  Patient verbalized understanding of these instructions and education.   Patient Verbal Consent Given: Yes Education Handout Provided: Yes Muscles Treated: L teres minor and major, and lat dorsi Electrical Stimulation Performed: No Treatment Response/Outcome: Muscle twitch  Therapeutic Activity: ROM and mobility, pain control  Pulleys scaption and overhead 2 min each during subjective  Wall wash shoulder flexion L shoulder row GTB 2x15 L shoulder ext GTB 2x10 Standing shoulder flex x10 Inclined shoulder flexion a t 70d2x10 S/L shoulder ER 3x10 1# S/L shoulder abd 2x10 thumb up 1#; 2x10 palm down 1#  [facility] Adult PT Treatment:              13 wks s/p Sx                                  DATE:  10/25/23 Therapeutic Activity: ROM and mobility, pain control  Pulleys scaption and overhead 2 min each during subjective  L shoulder row GTB 2x15 L shoulder ext GTB 2x10 Supine shoulder flex x10 S/L shoulder ER 3x10 1# S/L shoulder abd 1x10 thumb up; 2x10 palm down Inclined shoulder flexion at 60d 2x10  PATIENT EDUCATION: Education details: HEP review Person educated: Patient Education method: Programmer, multimedia, Facilities manager, Verbal cues Education comprehension: verbalized understanding and needs further education  HOME EXERCISE PROGRAM: Access Code: [email or web address] URL: [email or web address] Date: 10/27/2023 Prepared by: Joellyn Rued  Exercises - Supine Shoulder Press AAROM in Abduction with Dowel  - 1-3 x daily - 7 x weekly - 2 sets - 10 reps - 5 hold - Supine Shoulder Flexion Extension AAROM with Dowel  - 1-3 x daily - 7 x weekly -  2 sets - 10 reps - 5 hold - Supine Shoulder External Rotation with Dowel  - 1-3 x daily - 7 x weekly - 2 sets - 10 reps - 5 hold - Standing Shoulder Row with Anchored Resistance  - 1 x daily - 7 x weekly - 2 sets - 10 reps - 5 hold - Shoulder extension with resistance - Neutral  - 1 x daily - 7 x weekly - 2 sets - 10 reps - 5 hold - Seated Shoulder External Rotation PROM on Table  - 1 x daily - 7 x weekly - 2 sets - 10 reps - 30 hold - Seated Single Arm Shoulder Full Can AROM  - 1 x daily - 7 x weekly - 2 sets - 10 reps - 5 hold - Seated Single Arm Shoulder External Rotation with Self-Anchored Resistance  - 1 x daily - 7 x weekly - 2 sets - 10 reps - 5 hold - Shoulder Flexion Serratus Activation with Resistance  - 1 x daily - 7 x weekly - 2 sets - 10 reps - 30 hold - Sidelying Shoulder ER with Towel and Dumbbell  - 1 x daily - 7 x weekly - 3 sets - 10 reps - Sidelying Shoulder Abduction Palm Forward  - 1 x daily - 7 x weekly - 3 sets - 10 reps   ASSESSMENT:  CLINICAL IMPRESSION: Pt was completed for L shoulder ROM and strengthening. Pt reports  positive response to TPDN. Shoulder consult on right shoulder last week. He is scheduled for MRI April 28 and likely surgery to remove spurs per his reports.  Exercises were completed for strengthening and range of left shoulder.  Pt tolerated PT today without adverse effects. He arrived later, therefore session shortened.   EVAL: Patient continues to have significant pain in both of his shoulders.  He is limited in AROM of L UE see above.  He is able to work on shoulder strengthening despite pain levels of 8/10.  He is diligent about his HEP.  Pt reports Dr. Ophelia Charter told him who else would be seeing him at the office as he is retiring bit he does not recall who. PT will reach out for the patient to get more info on this.  He plans to use his TENS unit and is looking forward to getting dry needling in the coming weeks for pain relief.   EVAL: Patient is a 63 y.o. male who was seen today for physical therapy evaluation and treatment for L shoulder surgery. He reports he has been working on moving his shoulder at home.  He is 6 weeks out from surgery.  He will transition to light strengthening but will ensure he is following MD protocol.   OBJECTIVE IMPAIRMENTS: cardiopulmonary status limiting activity, decreased mobility, decreased ROM, decreased strength, hypomobility, increased fascial restrictions, impaired flexibility, impaired sensation, impaired UE functional use, improper body mechanics, postural dysfunction, obesity, and pain.   ACTIVITY LIMITATIONS: carrying, lifting, sitting, standing, sleeping, stairs, bed mobility, bathing, dressing, reach over head, hygiene/grooming, locomotion level, and caring for others  PARTICIPATION LIMITATIONS: meal prep, cleaning, interpersonal relationship, driving, shopping, community activity, yard work, and recreation  PERSONAL FACTORS: Education, Past/current experiences, Social background, and 3+ comorbidities: HTN, CVA , DM   are also affecting patient's  functional outcome.    GOALS: Goals reviewed with patient? Yes  SHORT TERM GOALS: Target date: 10/09/2023  Pt will be I with HEP for shoulder A/AROM, strength  Baseline:given on eval  Goal status:  met  2.  Pt will be able to use L arm for dressing and bathing with min difficulty.  Baseline: needs A  Goal status: ongoing   3.  Pt will note pain at rest, no more than min for recreation, meals Baseline: min to mod pain  Goal status:ongoing   4.  Pt will be able to use pillows/ice/heat appropriately for pain mgmt at home.  Baseline: needs reinforcement  Goal status:met  LONG TERM GOALS: Target date: 11/06/2023   Pt will be I with HEP for final HEP upon discharge Baseline: unknown  Goal status: INITIAL  2.  Pt will be able to carry 1-3 light items in his L UE down by his side (groceries) without increased pain  Baseline: unable Goal status: INITIAL  3.  Pt will be able to improve ability to sleep/rest with only min disturbance from sleep.  Baseline: severely limited  Goal status: INITIAL  4.  Pt will be able to report no sensory symptoms in LUE  Baseline: moderate N/T in L UE  Goal status: INITIAL  5.  LEFS will improve to 75% or better  Baseline: 89% Goal status: INITIAL   PLAN: PT FREQUENCY: 2x/week  PT DURATION: 8 weeks  PLANNED INTERVENTIONS: 97164- PT Re-evaluation, 97110-Therapeutic exercises, 97530- Therapeutic activity, 97112- Neuromuscular re-education, 97535- Self Care, 16109- Manual therapy, Patient/Family education, Taping, Dry Needling, Joint mobilization, Cryotherapy, and Moist heat  PLAN FOR NEXT SESSION: Add to HEP. Cont.  AAROM , Light strength.     Gasper Karst, PTA 10/30/23 11:53 AM Phone: [phone] Fax: [phone]

## 2023-10-31 DIAGNOSIS — I639 Cerebral infarction, unspecified: Secondary | ICD-10-CM | POA: Diagnosis not present

## 2023-11-01 DIAGNOSIS — I639 Cerebral infarction, unspecified: Secondary | ICD-10-CM | POA: Diagnosis not present

## 2023-11-01 NOTE — Therapy (Incomplete)
 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)    a.) s/p embolization 08/19/2020: 29 mm RIGHT internal iliac artery aneurysm   Anxiety    Aortic atherosclerosis (HCC)    Bilateral carpal tunnel syndrome 01/10/2018   Bipolar disorder (HCC)    Chorioretinal inflammation of both eyes    a.) on azothioprine   Chronic lower back pain    Coronary artery calcification seen on CT scan    a.) cCTA 04/21/2022: Ca score 20.6 (61st percentile for age/sex match control)   DDD (degenerative disc disease), cervical    Depression    Diastolic dysfunction    a.) TTE 01/13/2021: EF 60-65%, mod LVH, triv MR, G1DD   GERD (gastroesophageal reflux disease)    Hepatic steatosis    History of alcohol abuse    History of nuclear stress test    Myoview 10/16: EF 50%, diaphragmatic attenuation, no ischemia, low risk   Hypertension    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   a.) CT head 12/25/2012 --> RIGHT basal ganglia hypoattenuation related to remote lacunar infarct   Lipoma    Long term (current) use of anticoagulants    a.) apixaban   Long-term current use of immunomodulator    a.) on azothioprine for peripheral focal chorioretinal inflammation (both eyes)   Marijuana use    Migraine    Moderate persistent asthma with acute exacerbation 05/02/2018   PAF (paroxysmal atrial fibrillation) (HCC) 03/27/2015   a.) CHA2DS2VASc = 5 (HTN, CVA x2, vascular disease history, T2DM);  b.) s/p ablation 10/02/2015; c.) s/p ablation 12/10/2015; d.) s/p DCCV (200 J x 1) 12/11/2015; e.) s/p ablation 04/28/2022; f.) rate/rhythm maintained on oral diltiazem + carvedilol; chronically anticoagulated with apixaban   Pilonidal cyst    Schizophrenia (HCC)    Sciatica neuralgia    T2DM (type 2  diabetes mellitus) (HCC)    Thoracic aortic ectasia (HCC) 01/13/2021   a.) TTE 01/13/2021: Ao root 38 mm, asc Ao 39 mm   Past Surgical History:  Procedure Laterality Date   ATRIAL FIBRILLATION ABLATION  09/22/2015   ATRIAL FIBRILLATION ABLATION N/A 04/28/2022   Procedure: ATRIAL FIBRILLATION ABLATION;  Surgeon: Regan Lemming, MD;  Location: [facility];  Service: Cardiovascular;  Laterality: N/A;   CATARACT EXTRACTION Bilateral    CYST EXCISION  1996-97   surgery back of head    ELECTROPHYSIOLOGIC STUDY N/A 09/22/2015   Procedure: Atrial Fibrillation Ablation;  Surgeon: Will Jorja Loa, MD;  Location: [facility];  Service: Cardiovascular;  Laterality: N/A;   ELECTROPHYSIOLOGIC STUDY N/A 12/10/2015   Procedure: Atrial Fibrillation Ablation;  Surgeon: Will Jorja Loa, MD;  Location: [facility];  Service: Cardiovascular;  Laterality: N/A;   ELECTROPHYSIOLOGIC STUDY N/A 12/11/2015   Procedure: Cardioversion;  Surgeon: Will Jorja Loa, MD;  Location: [facility];  Service: Cardiovascular;  Laterality: N/A;   EMBOLIZATION (CATH LAB) Right 08/19/2020   Procedure: EMBOLIZATION;  Surgeon: Leonie Douglas, MD;  Location: [facility];  Service: Cardiovascular;  Laterality: Right;  hypogastric   EXCISION MASS HEAD N/A 01/06/2017   Procedure: EXCISION MASS FOREHEAD;  Surgeon: Glenna Fellows, MD;  Location: [facility];  Service: Plastics;  Laterality: N/A;  EXCISION MASS UPPER EXTREMETIES Right 08/08/2022   Procedure: EXCISION MASS RIGHT FOREARM;  Surgeon: Brunilda Capra, MD;  Location: [facility];  Service: Orthopedics;  Laterality: Right;  45 MIN   GANGLION CYST EXCISION Left    INTERCOSTAL NERVE BLOCK  07/19/2003   KNEE ARTHROSCOPY Right 07/18/2014   MASS EXCISION N/A 01/25/2021   Procedure: EXCISION SUBCUTANEOUS VS SUBFASCIAL MASS TORSO 3CM;  Surgeon: Alger Infield, MD;  Location: [facility];   Service: Plastics;  Laterality: N/A;   PILONIDAL CYST EXCISION N/A 09/13/2022   Procedure: CYST EXCISION PILONIDAL EXTENSIVE;  Surgeon: Emmalene Hare, MD;  Location: [facility];  Service: General;  Laterality: N/A;   SHOULDER ARTHROSCOPY WITH ROTATOR CUFF REPAIR AND OPEN BICEPS TENODESIS Left 07/26/2023   Procedure: LEFT SHOULDER ARTHROSCOPY WITH OPEN ROTATOR CUFF REPAIR AND OPEN BICEPS TENODESIS;  Surgeon: Adah Acron, MD;  Location: [facility];  Service: Orthopedics;  Laterality: Left;   Patient Active Problem List   Diagnosis Date Noted   Impingement syndrome of right shoulder 10/03/2023   Tendinitis of upper biceps tendon of left shoulder 06/12/2023   Complete tear of left rotator cuff 10/28/2022   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   Pilonidal cyst 09/13/2022   Aortic atherosclerosis (HCC) 06/07/2022   Hypercoagulable state due to paroxysmal atrial fibrillation (HCC) 03/23/2021   Type 2 diabetes mellitus with hyperglycemia, without long-term current use of insulin (HCC) 02/10/2021   Neck pain on left side 03/06/2019   Musculoskeletal chest pain 07/24/2018   Asthma, mild intermittent 05/02/2018   Allergic rhinitis caused by mold 05/02/2018   Tobacco use 05/02/2018   Bilateral carpal tunnel syndrome 01/10/2018   Stroke (HCC)    Schizophrenia (HCC)    Migraine    History of nuclear stress test    History of alcohol abuse    GERD (gastroesophageal reflux disease)    Dysrhythmia    Chronic lower back pain    Anxiety    Arthritis of knee, right 04/12/2017   Bipolar disorder (HCC) 04/03/2017   Lipoma of forehead 09/22/2016   Trigger ring finger of right hand 06/22/2016   Paroxysmal atrial fibrillation (HCC)    AF (atrial fibrillation) (HCC) 12/10/2015   Eczema 07/10/2015   History of CVA (cerebrovascular accident) 05/04/2015   Tear of medial meniscus of right knee 03/18/2015   Chronic pain of right knee 03/12/2015   Other and unspecified hyperlipidemia  11/25/2013   Nasal congestion 11/25/2013   Sinusitis, chronic 05/09/2013   Chronic low back pain 10/12/2012   Lumbar radiculopathy 10/12/2012   Hypertension associated with diabetes (HCC) 10/12/2012   Depression 10/12/2012   Insomnia 10/12/2012    PCP: Joaquin Mulberry MD   REFERRING PROVIDER: Tommy Frames MD   REFERRING DIAG: [id number] (ICD-10-CM) - Complete tear of left rotator cuff, unspecified whether traumatic  THERAPY DIAG:  No diagnosis found.  Rationale for Evaluation and Treatment: Rehabilitation  ONSET DATE: 07/26/23   SUBJECTIVE:        SUBJECTIVE STATEMENT: 28th will have a MRI on right shoulder. Will then have surgery to remove spurs.    PERTINENT HISTORY: HTN CVA DM Atrial fibrillation  PAIN:  Are you having pain? Yes: NPRS scale: 8/10 L lateral shoulder blade Pain location: deep in the shoulder joint  Pain description: sore achy and sharp Aggravating factors: holding it up, using it  Relieving factors: hot shower, massager, meds tried not to take it too much   Are you having pain? Yes: NPRS  scale: 9/10 Pain location: Back and Rt shoulder is keeping me up at night.  Pain description: sore achy and sharp Aggravating factors: holding it up, using it  Relieving factors: hot shower, massager, meds tried not to take it too much    PRECAUTIONS: Other: none   RED FLAGS: None   WEIGHT BEARING RESTRICTIONS: No  FALLS:  Has patient fallen in last 6 months? 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   PLOF: Independent with household mobility without device, Independent with community mobility without device, Needs assistance with ADLs, and Leisure: has a CNA in [place]  PATIENT GOALS:Patient wants to get ready for the highway.  Get  back to fishing    OBJECTIVE:  Note: Objective measures were complet ed at Evaluation unless otherwise noted. PATIENT SURVEYS:  Quick Dash 89    SENSATION: L biceps numb and tinging to fingers at times.  He reports this in the L UE as well.   POSTURE: Leans head to the L, shoulders elevated, shoulders rounded   UPPER EXTREMITY ROM:   Passive ROM Right eval Left eval Left 09/21/2023 Rt 10/12/23 Lt 10/12/23 Lt 10/27/23  Shoulder flexion  120 140 PROM 136 90 A 125d  Shoulder extension        Shoulder abduction  85 90 PROM 126 60 A 80d   Shoulder adduction        Shoulder internal rotation  55   FR to L L3   Shoulder external rotation  45 60 PROM     Elbow flexion  full       Elbow extension  full       UPPER EXTREMITY MMT: NT due to post surgical. Appears to be 3-/5  MMT Right 10/12/23 Left 10/12/23  Shoulder flexion 4+/5 3-/5  Shoulder extension    Shoulder abduction 4/5 3-/5  Shoulder adduction    Shoulder internal rotation  4/5  Shoulder external rotation  3+/5  Middle trapezius    Lower trapezius    Elbow flexion    Elbow extension    Wrist flexion    Wrist extension    Wrist ulnar deviation    Wrist radial deviation    Wrist pronation    Wrist supination    Grip strength (lbs)    (Blank rows = not tested)  SHOULDER SPECIAL TESTS: NT  JOINT MOBILITY TESTING:  Stiffness   PALPATION:  In TTP along Lt posterior and lateral shoulder                                                                                                                              TREATMENT DATE:  [facility] Adult PT Treatment:         14 wks s/p Sx  DATE: 11/02/23 Therapeutic Exercise: Row GTB Shoulder EXT GTB IR and ER 10 x 2 each Blue band  Standing shoulder flex x10 Standing shoulder abdct x 5  S/L shoulder Abdct x 10  Supine protraction 3# 15 x 2  Inclined shoulder flexion long lever 10 x 2  Inclined left shoulder horiz abduction GTB x 10  (therapist assist with band due to right shoulder pain Therapeutic Exercise: *** Manual Therapy: *** Neuromuscular re-ed: *** Therapeutic Activity: *** Modalities: *** Self Care: ***   [facility] Treatment:                                                DATE: 10/30/23 Therapeutic Exercise: Row GTB Shoulder EXT GTB IR and ER 10 x 2 each Blue band  Standing shoulder flex x10 Standing shoulder abdct x 5  S/L shoulder Abdct x 10  Supine protraction 3# 15 x 2  Inclined shoulder flexion long lever 10 x 2  Inclined left shoulder horiz abduction GTB x 10 (therapist assist with band due to right shoulder pain      [facility] Adult PT Treatment:                13 wks s/p Sx                                DATE: 4/11025 Manual Therapy: STM to the L teres minor and major, and lat dorsi Skilled palpation to identify TP and taut muscle bands Trigger Point Dry Needling  Initial Treatment: Pt instructed on Dry Needling rational, procedures, and possible side effects. Pt instructed to expect mild to moderate muscle soreness later in the day and/or into the next day.  Pt instructed in methods to reduce muscle soreness. Pt instructed to continue prescribed HEP. Because Dry Needling was performed over or adjacent to a lung field, pt was educated on S/S of pneumothorax and to seek immediate medical attention should they occur.  Patient was educated on signs and symptoms of infection and other risk factors and advised to seek medical attention should they occur.  Patient verbalized understanding of these instructions and education.   Patient Verbal Consent Given: Yes Education Handout Provided: Yes Muscles Treated: L teres minor and major, and lat dorsi Electrical Stimulation Performed: No Treatment Response/Outcome: Muscle twitch  Therapeutic Activity: ROM and mobility, pain control  Pulleys scaption and overhead 2 min each during subjective  Wall wash shoulder flexion L shoulder row GTB  2x15 L shoulder ext GTB 2x10 Standing shoulder flex x10 Inclined shoulder flexion a t 70d2x10 S/L shoulder ER 3x10 1# S/L shoulder abd 2x10 thumb up 1#; 2x10 palm down 1#  [facility] Adult PT Treatment:              13 wks s/p Sx                                  DATE: 10/25/23 Therapeutic Activity: ROM and mobility, pain control  Pulleys scaption and overhead 2 min each during subjective  L shoulder row GTB 2x15 L shoulder ext GTB 2x10 Supine shoulder flex x10 S/L shoulder ER 3x10 1# S/L shoulder abd 1x10 thumb up; 2x10 palm down Inclined shoulder flexion at 60d 2x10  PATIENT EDUCATION: Education details: HEP review Person educated: Patient Education method: Programmer, multimedia, Facilities manager, Verbal cues Education comprehension: verbalized understanding and needs further education  HOME EXERCISE PROGRAM: Access Code: [email or web address] URL: [email or web address] Date: 10/27/2023 Prepared by: Joellyn Rued  Exercises - Supine Shoulder Press AAROM in Abduction with Dowel  - 1-3 x daily - 7 x weekly - 2 sets - 10 reps - 5 hold - Supine Shoulder Flexion Extension AAROM with Dowel  - 1-3 x daily - 7 x weekly - 2 sets - 10 reps - 5 hold - Supine Shoulder External Rotation with Dowel  - 1-3 x daily - 7 x weekly - 2 sets - 10 reps - 5 hold - Standing Shoulder Row with Anchored Resistance  - 1 x daily - 7 x weekly - 2 sets - 10 reps - 5 hold - Shoulder extension with resistance - Neutral  - 1 x daily - 7 x weekly - 2 sets - 10 reps - 5 hold - Seated Shoulder External Rotation PROM on Table  - 1 x daily - 7 x weekly - 2 sets - 10 reps - 30 hold - Seated Single Arm Shoulder Full Can AROM  - 1 x daily - 7 x weekly - 2 sets - 10 reps - 5 hold - Seated Single Arm Shoulder External Rotation with Self-Anchored Resistance  - 1 x daily - 7 x weekly - 2 sets - 10 reps - 5 hold - Shoulder Flexion Serratus Activation with Resistance  - 1 x daily - 7 x weekly - 2 sets - 10 reps - 30 hold - Sidelying Shoulder ER  with Towel and Dumbbell  - 1 x daily - 7 x weekly - 3 sets - 10 reps - Sidelying Shoulder Abduction Palm Forward  - 1 x daily - 7 x weekly - 3 sets - 10 reps   ASSESSMENT:  CLINICAL IMPRESSION: Pt was completed for L shoulder ROM and strengthening. Pt reports positive response to TPDN. Shoulder consult on right shoulder last week. He is scheduled for MRI April 28 and likely surgery to remove spurs per his reports.  Exercises were completed for strengthening and range of left shoulder.  Pt tolerated PT today without adverse effects. He arrived later, therefore session shortened.   EVAL: Patient continues to have significant pain in both of his shoulders.  He is limited in AROM of L UE see above.  He is able to work on shoulder strengthening despite pain levels of 8/10.  He is diligent about his HEP.  Pt reports Dr. Ophelia Charter told him who else would be seeing him at the office as he is retiring bit he does not recall who. PT will reach out for the patient to get more info on this.  He plans to use his TENS unit and is looking forward to getting dry needling in the coming weeks for pain relief.   EVAL: Patient is a 63 y.o. male who was seen today for physical therapy evaluation and treatment for L shoulder surgery. He reports he has been working on moving his shoulder at home.  He is 6 weeks out from surgery.  He will transition to light strengthening but will ensure he is following MD protocol.   OBJECTIVE IMPAIRMENTS: cardiopulmonary status limiting activity, decreased mobility, decreased ROM, decreased strength, hypomobility, increased fascial restrictions, impaired flexibility, impaired sensation, impaired UE functional use, improper body mechanics, postural dysfunction, obesity, and pain.   ACTIVITY LIMITATIONS: carrying, lifting, sitting, standing, sleeping, stairs, bed mobility, bathing,  dressing, reach over head, hygiene/grooming, locomotion level, and caring for others  PARTICIPATION LIMITATIONS:  meal prep, cleaning, interpersonal relationship, driving, shopping, community activity, yard work, and recreation  PERSONAL FACTORS: Education, Past/current experiences, Social background, and 3+ comorbidities: HTN, CVA , DM   are also affecting patient's functional outcome.    GOALS: Goals reviewed with patient? Yes  SHORT TERM GOALS: Target date: 10/09/2023  Pt will be I with HEP for shoulder A/AROM, strength  Baseline:given on eval  Goal status: met  2.  Pt will be able to use L arm for dressing and bathing with min difficulty.  Baseline: needs A  Goal status: ongoing   3.  Pt will note pain at rest, no more than min for recreation, meals Baseline: min to mod pain  Goal status:ongoing   4.  Pt will be able to use pillows/ice/heat appropriately for pain mgmt at home.  Baseline: needs reinforcement  Goal status:met  LONG TERM GOALS: Target date: 11/06/2023   Pt will be I with HEP for final HEP upon discharge Baseline: unknown  Goal status: INITIAL  2.  Pt will be able to carry 1-3 light items in his L UE down by his side (groceries) without increased pain  Baseline: unable Goal status: INITIAL  3.  Pt will be able to improve ability to sleep/rest with only min disturbance from sleep.  Baseline: severely limited  Goal status: INITIAL  4.  Pt will be able to report no sensory symptoms in LUE  Baseline: moderate N/T in L UE  Goal status: INITIAL  5.  LEFS will improve to 75% or better  Baseline: 89% Goal status: INITIAL   PLAN: PT FREQUENCY: 2x/week  PT DURATION: 8 weeks  PLANNED INTERVENTIONS: 97164- PT Re-evaluation, 97110-Therapeutic exercises, 97530- Therapeutic activity, 97112- Neuromuscular re-education, 97535- Self Care, 16109- Manual therapy, Patient/Family education, Taping, Dry Needling, Joint mobilization, Cryotherapy, and Moist heat  PLAN FOR NEXT SESSION: Add to HEP. Cont.  AAROM , Light strength.     Zaedyn Covin MS, PT 11/01/23 6:28 AM

## 2023-11-02 ENCOUNTER — Ambulatory Visit: Admitting: Physical Therapy

## 2023-11-02 ENCOUNTER — Ambulatory Visit (INDEPENDENT_AMBULATORY_CARE_PROVIDER_SITE_OTHER): Payer: Self-pay

## 2023-11-02 DIAGNOSIS — I639 Cerebral infarction, unspecified: Secondary | ICD-10-CM | POA: Diagnosis not present

## 2023-11-02 DIAGNOSIS — J309 Allergic rhinitis, unspecified: Secondary | ICD-10-CM

## 2023-11-02 DIAGNOSIS — Z9889 Other specified postprocedural states: Secondary | ICD-10-CM

## 2023-11-02 DIAGNOSIS — M25512 Pain in left shoulder: Secondary | ICD-10-CM | POA: Diagnosis not present

## 2023-11-02 NOTE — Therapy (Signed)
 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)    a.) s/p embolization 08/19/2020: 29 mm RIGHT internal iliac artery aneurysm   Anxiety    Aortic atherosclerosis (HCC)    Bilateral carpal tunnel syndrome 01/10/2018   Bipolar disorder (HCC)    Chorioretinal inflammation of both eyes    a.) on azothioprine   Chronic lower back pain    Coronary artery calcification seen on CT scan    a.) cCTA 04/21/2022: Ca score 20.6 (61st percentile for age/sex match control)   DDD (degenerative disc disease), cervical    Depression    Diastolic dysfunction    a.) TTE 01/13/2021: EF 60-65%, mod LVH, triv MR, G1DD   GERD (gastroesophageal reflux disease)    Hepatic steatosis    History of alcohol abuse    History of nuclear stress test    Myoview 10/16: EF 50%, diaphragmatic attenuation, no ischemia, low risk   Hypertension    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   a.) CT head 12/25/2012 --> RIGHT basal ganglia hypoattenuation related to remote lacunar infarct   Lipoma    Long term (current) use of anticoagulants    a.) apixaban   Long-term current use of immunomodulator    a.) on azothioprine for peripheral focal chorioretinal inflammation (both eyes)   Marijuana use    Migraine    Moderate persistent asthma with acute exacerbation 05/02/2018   PAF (paroxysmal atrial fibrillation) (HCC) 03/27/2015   a.) CHA2DS2VASc = 5 (HTN, CVA x2, vascular disease history, T2DM);  b.) s/p ablation 10/02/2015; c.) s/p ablation 12/10/2015;  d.) s/p DCCV (200 J x 1) 12/11/2015; e.) s/p ablation 04/28/2022; f.) rate/rhythm maintained on oral diltiazem + carvedilol; chronically anticoagulated with apixaban   Pilonidal cyst    Schizophrenia (HCC)    Sciatica neuralgia    T2DM (type 2 diabetes mellitus) (HCC)    Thoracic aortic ectasia (HCC) 01/13/2021   a.) TTE 01/13/2021: Ao root 38 mm, asc Ao 39 mm   Past Surgical History:  Procedure Laterality Date   ATRIAL FIBRILLATION ABLATION  09/22/2015   ATRIAL FIBRILLATION ABLATION N/A 04/28/2022   Procedure: ATRIAL FIBRILLATION ABLATION;  Surgeon: Lei Pump, MD;  Location: [facility];  Service: Cardiovascular;  Laterality: N/A;   CATARACT EXTRACTION Bilateral    CYST EXCISION  1996-97   surgery back of head    ELECTROPHYSIOLOGIC STUDY N/A 09/22/2015   Procedure: Atrial Fibrillation Ablation;  Surgeon: Will Cortland Ding, MD;  Location: [facility];  Service: Cardiovascular;  Laterality: N/A;   ELECTROPHYSIOLOGIC STUDY N/A 12/10/2015   Procedure: Atrial Fibrillation Ablation;  Surgeon: Will Cortland Ding, MD;  Location: [facility];  Service: Cardiovascular;  Laterality: N/A;   ELECTROPHYSIOLOGIC STUDY N/A 12/11/2015   Procedure: Cardioversion;  Surgeon: Will Cortland Ding, MD;  Location: [facility]  [facility];  Service: Cardiovascular;  Laterality: N/A;   EMBOLIZATION (CATH LAB) Right 08/19/2020   Procedure: EMBOLIZATION;  Surgeon: Carlene Che, MD;  Location: [facility];  Service: Cardiovascular;  Laterality: Right;  hypogastric   EXCISION MASS HEAD N/A 01/06/2017   Procedure: EXCISION MASS FOREHEAD;  Surgeon: Alger Infield, MD;  Location: [facility];  Service: Plastics;  Laterality: N/A;   EXCISION MASS UPPER EXTREMETIES Right 08/08/2022   Procedure: EXCISION MASS RIGHT FOREARM;  Surgeon: Brunilda Capra, MD;  Location: [facility];  Service: Orthopedics;  Laterality: Right;  45 MIN   GANGLION CYST EXCISION  Left    INTERCOSTAL NERVE BLOCK  07/19/2003   KNEE ARTHROSCOPY Right 07/18/2014   MASS EXCISION N/A 01/25/2021   Procedure: EXCISION SUBCUTANEOUS VS SUBFASCIAL MASS TORSO 3CM;  Surgeon: Alger Infield, MD;  Location: [facility];  Service: Plastics;  Laterality: N/A;   PILONIDAL CYST EXCISION N/A 09/13/2022   Procedure: CYST EXCISION PILONIDAL EXTENSIVE;  Surgeon: Emmalene Hare, MD;  Location: [facility];  Service: General;  Laterality: N/A;   SHOULDER ARTHROSCOPY WITH ROTATOR CUFF REPAIR AND OPEN BICEPS TENODESIS Left 07/26/2023   Procedure: LEFT SHOULDER ARTHROSCOPY WITH OPEN ROTATOR CUFF REPAIR AND OPEN BICEPS TENODESIS;  Surgeon: Adah Acron, MD;  Location: [facility];  Service: Orthopedics;  Laterality: Left;   Patient Active Problem List   Diagnosis Date Noted   Impingement syndrome of right shoulder 10/03/2023   Tendinitis of upper biceps tendon of left shoulder 06/12/2023   Complete tear of left rotator cuff 10/28/2022   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   Pilonidal cyst 09/13/2022   Aortic atherosclerosis (HCC) 06/07/2022   Hypercoagulable state due to paroxysmal atrial fibrillation (HCC) 03/23/2021   Type 2 diabetes mellitus with hyperglycemia, without long-term current use of insulin (HCC) 02/10/2021   Neck pain on left side 03/06/2019   Musculoskeletal chest pain 07/24/2018   Asthma, mild intermittent 05/02/2018   Allergic rhinitis caused by mold 05/02/2018   Tobacco use 05/02/2018   Bilateral carpal tunnel syndrome 01/10/2018   Stroke (HCC)    Schizophrenia (HCC)    Migraine    History of nuclear stress test    History of alcohol abuse    GERD (gastroesophageal reflux disease)    Dysrhythmia    Chronic lower back pain    Anxiety    Arthritis of knee, right 04/12/2017   Bipolar disorder (HCC) 04/03/2017   Lipoma of forehead 09/22/2016   Trigger ring finger of right hand 06/22/2016   Paroxysmal atrial fibrillation (HCC)     AF (atrial fibrillation) (HCC) 12/10/2015   Eczema 07/10/2015   History of CVA (cerebrovascular accident) 05/04/2015   Tear of medial meniscus of right knee 03/18/2015   Chronic pain of right knee 03/12/2015   Other and unspecified hyperlipidemia 11/25/2013   Nasal congestion 11/25/2013   Sinusitis, chronic 05/09/2013   Chronic low back pain 10/12/2012   Lumbar radiculopathy 10/12/2012   Hypertension associated with diabetes (HCC) 10/12/2012   Depression 10/12/2012   Insomnia 10/12/2012    PCP: Joaquin Mulberry MD   REFERRING PROVIDER: Tommy Frames MD   REFERRING DIAG: [phone] (ICD-10-CM) - Complete tear of left rotator cuff, unspecified whether traumatic  THERAPY DIAG:  Acute pain of left shoulder  Status post left rotator cuff repair  Rationale for Evaluation and Treatment: Rehabilitation  ONSET DATE: 07/26/23   SUBJECTIVE:        SUBJECTIVE STATEMENT: 28th will have a  MRI on right shoulder. Will then have surgery to remove spurs.    PERTINENT HISTORY: HTN CVA DM Atrial fibrillation  PAIN:  Are you having pain? Yes: NPRS scale: 3-4/10 L shoulder incision Pain location: deep in the shoulder joint  Pain description: sore achy and sharp Aggravating factors: holding it up, using it  Relieving factors: hot shower, massager, meds tried not to take it too much   Are you having pain? Yes: NPRS scale: 9/10 Pain location: Back and Rt shoulder is keeping me up at night.  Pain description: sore achy and sharp Aggravating factors: holding it up, using it  Relieving factors: hot shower, massager, meds tried not to take it too much    PRECAUTIONS: Other: none   RED FLAGS: None   WEIGHT BEARING RESTRICTIONS: No  FALLS:  Has patient fallen in last 6 months? 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   PLOF: Independent with household mobility without device, Independent with community mobility without device, Needs assistance with ADLs, and Leisure: has a CNA in [place]  PATIENT GOALS:Patient wants to get ready for the highway.  Get back to fishing    OBJECTIVE:  Note: Objective measures were complet ed at Evaluation unless otherwise noted. PATIENT SURVEYS:  Quick Dash 89    SENSATION: L biceps numb and tinging to fingers at times.  He reports this in the L UE as well.   POSTURE: Leans head to the L, shoulders elevated, shoulders rounded   UPPER EXTREMITY ROM:   Passive ROM Right eval Left eval Left 09/21/2023 Rt 10/12/23 Lt 10/12/23 Lt 10/27/23 Lt  11/02/23  Shoulder flexion  120 140 PROM 136 90 A 125d 132 A  Shoulder extension         Shoulder abduction  85 90 PROM 126 60 A 80d  80 A  Shoulder adduction         Shoulder internal rotation  55   FR to L L3    Shoulder external rotation  45 60 PROM      Elbow flexion  full        Elbow extension  full        UPPER EXTREMITY MMT: NT due to post surgical. Appears to be 3-/5  MMT Right 10/12/23 Left 10/12/23  Shoulder flexion 4+/5 3-/5  Shoulder extension    Shoulder abduction 4/5 3-/5  Shoulder adduction    Shoulder internal rotation  4/5  Shoulder external rotation  3+/5  Middle trapezius    Lower trapezius    Elbow flexion    Elbow extension    Wrist flexion    Wrist extension    Wrist ulnar deviation    Wrist radial deviation    Wrist pronation    Wrist supination    Grip strength (lbs)    (Blank rows = not tested)  SHOULDER SPECIAL TESTS: NT  JOINT MOBILITY TESTING:  Stiffness   PALPATION:  In TTP along Lt posterior and lateral shoulder  TREATMENT DATE:  [facility] Adult PT Treatment:                                                 DATE: 11/02/23 Left shoulder PROM Left shoulder GHJ inferior and A/P mobs  Supine shoulder flexion 2# 15 x2  Standing UE ranger for shoulder abduction Standing AA wall slide for shoulder abduction/scaption Standing shoulder abduction isometric  Row Left blue band  Shoulder extension GTB IR and ER 15 x 2 each Blue band  Standing UE ranger shoulder flexion x 15    [facility] Adult PT Treatment:                                                DATE: 10/30/23 Therapeutic Exercise: Row GTB Shoulder EXT GTB IR and ER 10 x 2 each Blue band  Standing shoulder flex x10 Standing shoulder abdct x 5  S/L shoulder Abdct x 10  Supine protraction 3# 15 x 2  Inclined shoulder flexion long lever 10 x 2  Inclined left shoulder horiz abduction GTB x 10 (therapist assist with band due to right shoulder pain      [facility] Adult PT Treatment:                13 wks s/p Sx                                DATE: 4/11025 Manual Therapy: STM to the L teres minor and major, and lat dorsi Skilled palpation to identify TP and taut muscle bands Trigger Point Dry Needling  Initial Treatment: Pt instructed on Dry Needling rational, procedures, and possible side effects. Pt instructed to expect mild to moderate muscle soreness later in the day and/or into the next day.  Pt instructed in methods to reduce muscle soreness. Pt instructed to continue prescribed HEP. Because Dry Needling was performed over or adjacent to a lung field, pt was educated on S/S of pneumothorax and to seek immediate medical attention should they occur.  Patient was educated on signs and symptoms of infection and other risk factors and advised to seek medical attention should they occur.  Patient verbalized understanding of these instructions and education.   Patient Verbal Consent Given: Yes Education Handout Provided: Yes Muscles Treated: L teres minor and major, and lat dorsi Electrical Stimulation Performed: No Treatment Response/Outcome:  Muscle twitch  Therapeutic Activity: ROM and mobility, pain control  Pulleys scaption and overhead 2 min each during subjective  Wall wash shoulder flexion L shoulder row GTB 2x15 L shoulder ext GTB 2x10 Standing shoulder flex x10 Inclined shoulder flexion a t 70d2x10 S/L shoulder ER 3x10 1# S/L shoulder abd 2x10 thumb up 1#; 2x10 palm down 1#  [facility] Adult PT Treatment:              13 wks s/p Sx                                  DATE: 10/25/23 Therapeutic Activity: ROM and mobility, pain control  Pulleys scaption and overhead 2 min each during subjective  L shoulder  row GTB 2x15 L shoulder ext GTB 2x10 Supine shoulder flex x10 S/L shoulder ER 3x10 1# S/L shoulder abd 1x10 thumb up; 2x10 palm down Inclined shoulder flexion at 60d 2x10  PATIENT EDUCATION: Education details: HEP review Person educated: Patient Education method: Programmer, multimedia, Facilities manager, Verbal cues Education comprehension: verbalized understanding and needs further education  HOME EXERCISE PROGRAM: Access Code: [email or web address] URL: [email or web address] Date: 10/27/2023 Prepared by: Liborio Reeds  Exercises - Supine Shoulder Press AAROM in Abduction with Dowel  - 1-3 x daily - 7 x weekly - 2 sets - 10 reps - 5 hold - Supine Shoulder Flexion Extension AAROM with Dowel  - 1-3 x daily - 7 x weekly - 2 sets - 10 reps - 5 hold - Supine Shoulder External Rotation with Dowel  - 1-3 x daily - 7 x weekly - 2 sets - 10 reps - 5 hold - Standing Shoulder Row with Anchored Resistance  - 1 x daily - 7 x weekly - 2 sets - 10 reps - 5 hold - Shoulder extension with resistance - Neutral  - 1 x daily - 7 x weekly - 2 sets - 10 reps - 5 hold - Seated Shoulder External Rotation PROM on Table  - 1 x daily - 7 x weekly - 2 sets - 10 reps - 30 hold - Seated Single Arm Shoulder Full Can AROM  - 1 x daily - 7 x weekly - 2 sets - 10 reps - 5 hold - Seated Single Arm Shoulder External Rotation with Self-Anchored Resistance  - 1 x  daily - 7 x weekly - 2 sets - 10 reps - 5 hold - Shoulder Flexion Serratus Activation with Resistance  - 1 x daily - 7 x weekly - 2 sets - 10 reps - 30 hold - Sidelying Shoulder ER with Towel and Dumbbell  - 1 x daily - 7 x weekly - 3 sets - 10 reps - Sidelying Shoulder Abduction Palm Forward  - 1 x daily - 7 x weekly - 3 sets - 10 reps   ASSESSMENT:  CLINICAL IMPRESSION: Pt was completed for L shoulder ROM and strengthening. Aaron Aas He is scheduled for MRI April 28 and likely surgery to remove spurs per his reports.  Exercises were completed for strengthening and range of left shoulder. Most limited with active abduction which is less than 90. Also limited by right shoulder pain and is unable to tolerate sidelying on that side.  Did well with AA in standing using wall or UE ranger. Pt tolerated PT today without adverse effects. He arrived later, therefore session shortened.   EVAL: Patient continues to have significant pain in both of his shoulders.  He is limited in AROM of L UE see above.  He is able to work on shoulder strengthening despite pain levels of 8/10.  He is diligent about his HEP.  Pt reports Dr. Murrel Arnt told him who else would be seeing him at the office as he is retiring bit he does not recall who. PT will reach out for the patient to get more info on this.  He plans to use his TENS unit and is looking forward to getting dry needling in the coming weeks for pain relief.   EVAL: Patient is a 63 y.o. male who was seen today for physical therapy evaluation and treatment for L shoulder surgery. He reports he has been working on moving his shoulder at home.  He is 6 weeks out from surgery.  He will transition to  light strengthening but will ensure he is following MD protocol.   OBJECTIVE IMPAIRMENTS: cardiopulmonary status limiting activity, decreased mobility, decreased ROM, decreased strength, hypomobility, increased fascial restrictions, impaired flexibility, impaired sensation, impaired UE  functional use, improper body mechanics, postural dysfunction, obesity, and pain.   ACTIVITY LIMITATIONS: carrying, lifting, sitting, standing, sleeping, stairs, bed mobility, bathing, dressing, reach over head, hygiene/grooming, locomotion level, and caring for others  PARTICIPATION LIMITATIONS: meal prep, cleaning, interpersonal relationship, driving, shopping, community activity, yard work, and recreation  PERSONAL FACTORS: Education, Past/current experiences, Social background, and 3+ comorbidities: HTN, CVA , DM   are also affecting patient's functional outcome.    GOALS: Goals reviewed with patient? Yes  SHORT TERM GOALS: Target date: 10/09/2023  Pt will be I with HEP for shoulder A/AROM, strength  Baseline:given on eval  Goal status: met  2.  Pt will be able to use L arm for dressing and bathing with min difficulty.  Baseline: needs A  Goal status: ongoing   3.  Pt will note pain at rest, no more than min for recreation, meals Baseline: min to mod pain  Goal status:ongoing   4.  Pt will be able to use pillows/ice/heat appropriately for pain mgmt at home.  Baseline: needs reinforcement  Goal status:met  LONG TERM GOALS: Target date: 11/06/2023   Pt will be I with HEP for final HEP upon discharge Baseline: unknown  Goal status: INITIAL  2.  Pt will be able to carry 1-3 light items in his L UE down by his side (groceries) without increased pain  Baseline: unable Goal status: INITIAL  3.  Pt will be able to improve ability to sleep/rest with only min disturbance from sleep.  Baseline: severely limited  Goal status: INITIAL  4.  Pt will be able to report no sensory symptoms in LUE  Baseline: moderate N/T in L UE  Goal status: INITIAL  5.  LEFS will improve to 75% or better  Baseline: 89% Goal status: INITIAL   PLAN: PT FREQUENCY: 2x/week  PT DURATION: 8 weeks  PLANNED INTERVENTIONS: 97164- PT Re-evaluation, 97110-Therapeutic exercises, 97530- Therapeutic  activity, 97112- Neuromuscular re-education, 97535- Self Care, 16109- Manual therapy, Patient/Family education, Taping, Dry Needling, Joint mobilization, Cryotherapy, and Moist heat  PLAN FOR NEXT SESSION: Add to HEP. Cont.  AAROM , Light strength.     Gasper Karst, PTA 11/02/23 8:48 AM Phone: [phone] Fax: [phone]

## 2023-11-03 DIAGNOSIS — I639 Cerebral infarction, unspecified: Secondary | ICD-10-CM | POA: Diagnosis not present

## 2023-11-04 DIAGNOSIS — I639 Cerebral infarction, unspecified: Secondary | ICD-10-CM | POA: Diagnosis not present

## 2023-11-05 DIAGNOSIS — I639 Cerebral infarction, unspecified: Secondary | ICD-10-CM | POA: Diagnosis not present

## 2023-11-06 ENCOUNTER — Ambulatory Visit: Admitting: Physical Therapy

## 2023-11-06 ENCOUNTER — Encounter: Payer: Self-pay | Admitting: Physical Therapy

## 2023-11-06 DIAGNOSIS — I639 Cerebral infarction, unspecified: Secondary | ICD-10-CM | POA: Diagnosis not present

## 2023-11-06 DIAGNOSIS — Z9889 Other specified postprocedural states: Secondary | ICD-10-CM

## 2023-11-06 DIAGNOSIS — M25512 Pain in left shoulder: Secondary | ICD-10-CM

## 2023-11-06 NOTE — Therapy (Signed)
 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)    a.) s/p embolization 08/19/2020: 29 mm RIGHT internal iliac artery aneurysm   Anxiety    Aortic atherosclerosis (HCC)    Bilateral carpal tunnel syndrome 01/10/2018   Bipolar disorder (HCC)    Chorioretinal inflammation of both eyes    a.) on azothioprine   Chronic lower back pain    Coronary artery calcification seen on CT scan    a.) cCTA 04/21/2022: Ca score 20.6 (61st percentile for age/sex match control)   DDD (degenerative disc disease), cervical    Depression    Diastolic dysfunction    a.) TTE 01/13/2021: EF 60-65%, mod LVH, triv MR, G1DD   GERD (gastroesophageal reflux disease)    Hepatic steatosis    History of alcohol abuse    History of nuclear stress test    Myoview 10/16: EF 50%, diaphragmatic attenuation, no ischemia, low risk   Hypertension    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   a.) CT head 12/25/2012 --> RIGHT basal ganglia hypoattenuation related to remote lacunar infarct   Lipoma    Long term (current) use of anticoagulants    a.) apixaban    Long-term current use of immunomodulator    a.) on azothioprine for peripheral focal chorioretinal inflammation (both eyes)   Marijuana use    Migraine    Moderate persistent asthma with acute exacerbation 05/02/2018   PAF (paroxysmal atrial fibrillation) (HCC) 03/27/2015   a.) CHA2DS2VASc = 5 (HTN, CVA x2, vascular disease history, T2DM);  b.) s/p ablation 10/02/2015; c.) s/p ablation  12/10/2015; d.) s/p DCCV (200 J x 1) 12/11/2015; e.) s/p ablation 04/28/2022; f.) rate/rhythm maintained on oral diltiazem  + carvedilol ; chronically anticoagulated with apixaban    Pilonidal cyst    Schizophrenia (HCC)    Sciatica neuralgia    T2DM (type 2 diabetes mellitus) (HCC)    Thoracic aortic ectasia (HCC) 01/13/2021   a.) TTE 01/13/2021: Ao root 38 mm, asc Ao 39 mm   Past Surgical History:  Procedure Laterality Date   ATRIAL FIBRILLATION ABLATION  09/22/2015   ATRIAL FIBRILLATION ABLATION N/A 04/28/2022   Procedure: ATRIAL FIBRILLATION ABLATION;  Surgeon: Lei Pump, MD;  Location: [facility];  Service: Cardiovascular;  Laterality: N/A;   CATARACT EXTRACTION Bilateral    CYST EXCISION  1996-97   surgery back of head    ELECTROPHYSIOLOGIC STUDY N/A 09/22/2015   Procedure: Atrial Fibrillation Ablation;  Surgeon: Will Cortland Ding, MD;  Location: [facility];  Service: Cardiovascular;  Laterality: N/A;   ELECTROPHYSIOLOGIC STUDY N/A 12/10/2015   Procedure: Atrial Fibrillation Ablation;  Surgeon: Will Cortland Ding, MD;  Location: [facility];  Service: Cardiovascular;  Laterality: N/A;   ELECTROPHYSIOLOGIC STUDY N/A 12/11/2015   Procedure: Cardioversion;  Surgeon: Will Cortland Ding, MD;  Location:  [facility];  Service: Cardiovascular;  Laterality: N/A;   EMBOLIZATION (CATH LAB) Right 08/19/2020   Procedure: EMBOLIZATION;  Surgeon: Carlene Che, MD;  Location: [facility];  Service: Cardiovascular;  Laterality: Right;  hypogastric   EXCISION MASS HEAD N/A 01/06/2017   Procedure: EXCISION MASS FOREHEAD;  Surgeon: Alger Infield, MD;  Location: [facility];  Service: Plastics;  Laterality: N/A;   EXCISION MASS UPPER EXTREMETIES Right 08/08/2022   Procedure: EXCISION MASS RIGHT FOREARM;  Surgeon: Brunilda Capra, MD;  Location: [facility];  Service: Orthopedics;  Laterality: Right;  45 MIN   GANGLION CYST  EXCISION Left    INTERCOSTAL NERVE BLOCK  07/19/2003   KNEE ARTHROSCOPY Right 07/18/2014   MASS EXCISION N/A 01/25/2021   Procedure: EXCISION SUBCUTANEOUS VS SUBFASCIAL MASS TORSO 3CM;  Surgeon: Alger Infield, MD;  Location: [facility];  Service: Plastics;  Laterality: N/A;   PILONIDAL CYST EXCISION N/A 09/13/2022   Procedure: CYST EXCISION PILONIDAL EXTENSIVE;  Surgeon: Emmalene Hare, MD;  Location: [facility];  Service: General;  Laterality: N/A;   SHOULDER ARTHROSCOPY WITH ROTATOR CUFF REPAIR AND OPEN BICEPS TENODESIS Left 07/26/2023   Procedure: LEFT SHOULDER ARTHROSCOPY WITH OPEN ROTATOR CUFF REPAIR AND OPEN BICEPS TENODESIS;  Surgeon: Adah Acron, MD;  Location: [facility];  Service: Orthopedics;  Laterality: Left;   Patient Active Problem List   Diagnosis Date Noted   Impingement syndrome of right shoulder 10/03/2023   Tendinitis of upper biceps tendon of left shoulder 06/12/2023   Complete tear of left rotator cuff 10/28/2022   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   Pilonidal cyst 09/13/2022   Aortic atherosclerosis (HCC) 06/07/2022   Hypercoagulable state due to paroxysmal atrial fibrillation (HCC) 03/23/2021   Type 2 diabetes mellitus with hyperglycemia, without long-term current use of insulin (HCC) 02/10/2021   Neck pain on left side 03/06/2019   Musculoskeletal chest pain 07/24/2018   Asthma, mild intermittent 05/02/2018   Allergic rhinitis caused by mold 05/02/2018   Tobacco use 05/02/2018   Bilateral carpal tunnel syndrome 01/10/2018   Stroke (HCC)    Schizophrenia (HCC)    Migraine    History of nuclear stress test    History of alcohol abuse    GERD (gastroesophageal reflux disease)    Dysrhythmia    Chronic lower back pain    Anxiety    Arthritis of knee, right 04/12/2017   Bipolar disorder (HCC) 04/03/2017   Lipoma of forehead 09/22/2016   Trigger ring finger of right hand 06/22/2016   Paroxysmal atrial fibrillation  (HCC)    AF (atrial fibrillation) (HCC) 12/10/2015   Eczema 07/10/2015   History of CVA (cerebrovascular accident) 05/04/2015   Tear of medial meniscus of right knee 03/18/2015   Chronic pain of right knee 03/12/2015   Other and unspecified hyperlipidemia 11/25/2013   Nasal congestion 11/25/2013   Sinusitis, chronic 05/09/2013   Chronic low back pain 10/12/2012   Lumbar radiculopathy 10/12/2012   Hypertension associated with diabetes (HCC) 10/12/2012   Depression 10/12/2012   Insomnia 10/12/2012    PCP: Joaquin Mulberry MD   REFERRING PROVIDER: Tommy Frames MD   REFERRING DIAG: [phone] (ICD-10-CM) - Complete tear of left rotator cuff, unspecified whether traumatic  THERAPY DIAG:  Acute pain of left shoulder  Status post left rotator cuff repair  Rationale for Evaluation and Treatment: Rehabilitation  ONSET DATE: 07/26/23   SUBJECTIVE:        SUBJECTIVE STATEMENT: 28th  will have a MRI on right shoulder. Will then have surgery to remove spurs.    PERTINENT HISTORY: HTN CVA DM Atrial fibrillation  PAIN:  Are you having pain? Yes: NPRS scale: 3-4/10 L shoulder incision Pain location: deep in the shoulder joint  Pain description: sore achy and sharp Aggravating factors: holding it up, using it  Relieving factors: hot shower, massager, meds tried not to take it too much   Are you having pain? Yes: NPRS scale: 9/10 Pain location: Back and Rt shoulder is keeping me up at night.  Pain description: sore achy and sharp Aggravating factors: holding it up, using it  Relieving factors: hot shower, massager, meds tried not to take it too much    PRECAUTIONS: Other: none   RED FLAGS: None   WEIGHT BEARING RESTRICTIONS: No  FALLS:  Has patient fallen in last 6 months? 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   PLOF: Independent with household mobility without device, Independent with community mobility without device, Needs assistance with ADLs, and Leisure: has a CNA in [place]  PATIENT GOALS:Patient wants to get ready for the highway.  Get back to fishing    OBJECTIVE:  Note: Objective measures were complet ed at Evaluation unless otherwise noted. PATIENT SURVEYS:  Quick Dash 89    SENSATION: L biceps numb and tinging to fingers at times.  He reports this in the L UE as well.   POSTURE: Leans head to the L, shoulders elevated, shoulders rounded   UPPER EXTREMITY ROM:   Passive ROM Right eval Left eval Left 09/21/2023 Rt 10/12/23 Lt 10/12/23 Lt 10/27/23 Lt  11/02/23 11/06/23 Lt  Shoulder flexion  120 140 PROM 136 90 A 125d 132 A 140 A  Shoulder extension          Shoulder abduction  85 90 PROM 126 60 A 80d  80 A 130 A  Shoulder adduction          Shoulder internal rotation  55   FR to L L3     Shoulder external rotation  45 60 PROM       Elbow flexion  full         Elbow extension  full         UPPER EXTREMITY MMT: NT due to post surgical. Appears to be 3-/5  MMT Right 10/12/23 Left 10/12/23  Shoulder flexion 4+/5 3-/5  Shoulder extension    Shoulder abduction 4/5 3-/5  Shoulder adduction    Shoulder internal rotation  4/5  Shoulder external rotation  3+/5  Middle trapezius    Lower trapezius    Elbow flexion    Elbow extension    Wrist flexion    Wrist extension    Wrist ulnar deviation    Wrist radial deviation    Wrist pronation    Wrist supination    Grip strength (lbs)    (Blank rows = not tested)  SHOULDER SPECIAL TESTS: NT  JOINT MOBILITY TESTING:  Stiffness   PALPATION:  In TTP along Lt posterior and lateral shoulder  TREATMENT DATE:  [facility] Adult PT Treatment:                                                 DATE: 11/06/23 Therapeutic Activity Standing shoulder flexion x 10 AROM Standing shoulder abduction x 10 AROM UE ranger standing AAROM shoulder flexion and scaptionx 15 each Supine left shoulder 3# long lever flexion x 20 Supine protraction 3# x 20  Therapeutic Exercise: Left shoulder row blue x 20 Left shoulder ext Green x 20  Left shoudler IR Blue x 20 Let shouler ER blue x 20  Standing left red band horiz abdct x 15 - attached band     [facility] Adult PT Treatment:                                                DATE: 11/02/23 Left shoulder PROM Left shoulder GHJ inferior and A/P mobs  Supine shoulder flexion 2# 15 x2  Standing UE ranger for shoulder abduction Standing AA wall slide for shoulder abduction/scaption Standing shoulder abduction isometric  Row Left blue band  Shoulder extension GTB IR and ER 15 x 2 each Blue band  Standing UE ranger shoulder flexion x 15    [facility] Adult PT Treatment:                                                DATE: 10/30/23 Therapeutic Exercise: Row GTB Shoulder EXT GTB IR and ER 10 x 2 each Blue band  Standing shoulder flex x10 Standing shoulder abdct x 5  S/L shoulder Abdct x 10  Supine protraction 3# 15 x 2  Inclined shoulder flexion long lever 10 x 2  Inclined left shoulder horiz abduction GTB x 10 (therapist assist with band due to right shoulder pain      [facility] Adult PT Treatment:                13 wks s/p Sx                                DATE: 4/11025 Manual Therapy: STM to the L teres minor and major, and lat dorsi Skilled palpation to identify TP and taut muscle bands Trigger Point Dry Needling  Initial Treatment: Pt instructed on Dry Needling rational, procedures, and possible side effects. Pt instructed to expect mild to moderate muscle soreness later in the day and/or into the next day.  Pt instructed in methods to reduce muscle soreness. Pt instructed to continue prescribed  HEP. Because Dry Needling was performed over or adjacent to a lung field, pt was educated on S/S of pneumothorax and to seek immediate medical attention should they occur.  Patient was educated on signs and symptoms of infection and other risk factors and advised to seek medical attention should they occur.  Patient verbalized understanding of these instructions and education.   Patient Verbal Consent Given: Yes Education Handout Provided: Yes Muscles Treated: L teres minor and major, and lat dorsi Electrical Stimulation Performed: No Treatment Response/Outcome: Muscle twitch  Therapeutic Activity: ROM and mobility, pain control  Pulleys scaption and overhead 2 min each during subjective  Wall wash shoulder flexion L shoulder row GTB 2x15 L shoulder ext GTB 2x10 Standing shoulder flex x10 Inclined shoulder flexion a t 70d2x10 S/L shoulder ER 3x10 1# S/L shoulder abd 2x10 thumb up 1#; 2x10 palm down 1#  [facility] Adult PT Treatment:              13 wks s/p Sx                                  DATE: 10/25/23 Therapeutic Activity: ROM and mobility, pain control  Pulleys scaption and overhead 2 min each during subjective  L shoulder row GTB 2x15 L shoulder ext GTB 2x10 Supine shoulder flex x10 S/L shoulder ER 3x10 1# S/L shoulder abd 1x10 thumb up; 2x10 palm down Inclined shoulder flexion at 60d 2x10  PATIENT EDUCATION: Education details: HEP review Person educated: Patient Education method: Programmer, multimedia, Facilities manager, Verbal cues Education comprehension: verbalized understanding and needs further education  HOME EXERCISE PROGRAM: Access Code: [email or web address] URL: [email or web address] Date: 10/27/2023 Prepared by: Liborio Reeds  Exercises - Supine Shoulder Press AAROM in Abduction with Dowel  - 1-3 x daily - 7 x weekly - 2 sets - 10 reps - 5 hold - Supine Shoulder Flexion Extension AAROM with Dowel  - 1-3 x daily - 7 x weekly - 2 sets - 10 reps - 5 hold - Supine Shoulder  External Rotation with Dowel  - 1-3 x daily - 7 x weekly - 2 sets - 10 reps - 5 hold - Standing Shoulder Row with Anchored Resistance  - 1 x daily - 7 x weekly - 2 sets - 10 reps - 5 hold - Shoulder extension with resistance - Neutral  - 1 x daily - 7 x weekly - 2 sets - 10 reps - 5 hold - Seated Shoulder External Rotation PROM on Table  - 1 x daily - 7 x weekly - 2 sets - 10 reps - 30 hold - Seated Single Arm Shoulder Full Can AROM  - 1 x daily - 7 x weekly - 2 sets - 10 reps - 5 hold - Seated Single Arm Shoulder External Rotation with Self-Anchored Resistance  - 1 x daily - 7 x weekly - 2 sets - 10 reps - 5 hold - Shoulder Flexion Serratus Activation with Resistance  - 1 x daily - 7 x weekly - 2 sets - 10 reps - 30 hold - Sidelying Shoulder ER with Towel and Dumbbell  - 1 x daily - 7 x weekly - 3 sets - 10 reps - Sidelying Shoulder Abduction Palm Forward  - 1 x daily - 7 x weekly - 3 sets - 10 reps   ASSESSMENT:  CLINICAL IMPRESSION: Pt now able to complete left shoulder abduction AROM against gravity. He continues to be limited by right shoulder pain, so unable to complete side lying.  He is scheduled for right shoulder MRI April 28 and likely surgery to remove spurs per his reports.  Exercises were completed for strengthening and range of left shoulder.  Pt tolerated PT today without adverse effects. He arrived later, therefore session shortened. Pt needs extension of POC to continue strength and function of LUE.   EVAL: Patient continues to have significant pain in both of his shoulders.  He is limited in AROM of L UE see above.  He is able to work on shoulder strengthening despite pain levels of 8/10.  He is diligent about his HEP.  Pt reports Dr. Murrel Arnt told him who else would be seeing him at the office as he is retiring bit he does not recall who. PT will reach out for the patient to get more info on this.  He plans to use his TENS unit and is looking forward to getting dry needling in the  coming weeks for pain relief.   EVAL: Patient is a 63 y.o. male who was seen today for physical therapy evaluation and treatment for L shoulder surgery. He reports he has been working on moving his shoulder at home.  He is 6 weeks out from surgery.  He will transition to light strengthening but will ensure he is following MD protocol.   OBJECTIVE IMPAIRMENTS: cardiopulmonary status limiting activity, decreased mobility, decreased ROM, decreased strength, hypomobility, increased fascial restrictions, impaired flexibility, impaired sensation, impaired UE functional use, improper body mechanics, postural dysfunction, obesity, and pain.   ACTIVITY LIMITATIONS: carrying, lifting, sitting, standing, sleeping, stairs, bed mobility, bathing, dressing, reach over head, hygiene/grooming, locomotion level, and caring for others  PARTICIPATION LIMITATIONS: meal prep, cleaning, interpersonal relationship, driving, shopping, community activity, yard work, and recreation  PERSONAL FACTORS: Education, Past/current experiences, Social background, and 3+ comorbidities: HTN, CVA , DM   are also affecting patient's functional outcome.    GOALS: Goals reviewed with patient? Yes  SHORT TERM GOALS: Target date: 10/09/2023  Pt will be I with HEP for shoulder A/AROM, strength  Baseline:given on eval  Goal status: met  2.  Pt will be able to use L arm for dressing and bathing with min difficulty.  Baseline: needs A  Goal status: ongoing   3.  Pt will note pain at rest, no more than min for recreation, meals Baseline: min to mod pain  Goal status:ongoing   4.  Pt will be able to use pillows/ice/heat appropriately for pain mgmt at home.  Baseline: needs reinforcement  Goal status:met  LONG TERM GOALS: Target date: 11/06/2023   Pt will be I with HEP for final HEP upon discharge Baseline: unknown  Goal status: INITIAL  2.  Pt will be able to carry 1-3 light items in his L UE down by his side (groceries)  without increased pain  Baseline: unable Goal status: INITIAL  3.  Pt will be able to improve ability to sleep/rest with only min disturbance from sleep.  Baseline: severely limited  Goal status: INITIAL  4.  Pt will be able to report no sensory symptoms in LUE  Baseline: moderate N/T in L UE  Goal status: INITIAL  5.  LEFS will improve to 75% or better  Baseline: 89% Goal status: INITIAL   PLAN: PT FREQUENCY: 2x/week  PT DURATION: 8 weeks  PLANNED INTERVENTIONS: 97164- PT Re-evaluation, 97110-Therapeutic exercises, 97530- Therapeutic activity, 97112- Neuromuscular re-education, 97535- Self Care, 16109- Manual therapy, Patient/Family education, Taping, Dry Needling, Joint mobilization, Cryotherapy, and Moist heat  PLAN FOR NEXT SESSION: Add to HEP. Cont.  AAROM , Light strength.   Re-eval  Gasper Karst, PTA 11/06/23 10:26 AM Phone: [phone] Fax: [phone]

## 2023-11-07 DIAGNOSIS — I639 Cerebral infarction, unspecified: Secondary | ICD-10-CM | POA: Diagnosis not present

## 2023-11-08 DIAGNOSIS — I639 Cerebral infarction, unspecified: Secondary | ICD-10-CM | POA: Diagnosis not present

## 2023-11-09 ENCOUNTER — Ambulatory Visit: Admitting: Physical Therapy

## 2023-11-09 ENCOUNTER — Ambulatory Visit (INDEPENDENT_AMBULATORY_CARE_PROVIDER_SITE_OTHER): Payer: Self-pay

## 2023-11-09 ENCOUNTER — Encounter: Payer: Self-pay | Admitting: Physical Therapy

## 2023-11-09 DIAGNOSIS — J309 Allergic rhinitis, unspecified: Secondary | ICD-10-CM

## 2023-11-09 DIAGNOSIS — M25512 Pain in left shoulder: Secondary | ICD-10-CM

## 2023-11-09 DIAGNOSIS — I639 Cerebral infarction, unspecified: Secondary | ICD-10-CM | POA: Diagnosis not present

## 2023-11-09 DIAGNOSIS — M6281 Muscle weakness (generalized): Secondary | ICD-10-CM

## 2023-11-09 DIAGNOSIS — Z9889 Other specified postprocedural states: Secondary | ICD-10-CM

## 2023-11-09 NOTE — Therapy (Signed)
 OUTPATIENT PHYSICAL THERAPY TREATMENT RE-EVAL   Patient Name: Jason Stokes MRN: [id number] DOB:06-Jan-1961, 63 y.o., male Today's Date: 11/09/2023   END OF SESSION:  PT End of Session - 11/09/23 0945     Visit Number 13    Number of Visits 29    Date for PT Re-Evaluation 01/04/24    Authorization Type [employer]    Authorization - Number of Visits 27    PT Start Time [phone]   late   PT Stop Time 1018    PT Time Calculation (min) 32 min    Activity Tolerance Patient tolerated treatment well    Behavior During Therapy WFL for tasks assessed/performed                     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)    a.) s/p embolization 08/19/2020: 29 mm RIGHT internal iliac artery aneurysm   Anxiety    Aortic atherosclerosis (HCC)    Bilateral carpal tunnel syndrome 01/10/2018   Bipolar disorder (HCC)    Chorioretinal inflammation of both eyes    a.) on azothioprine   Chronic lower back pain    Coronary artery calcification seen on CT scan    a.) cCTA 04/21/2022: Ca score 20.6 (61st percentile for age/sex match control)   DDD (degenerative disc disease), cervical    Depression    Diastolic dysfunction    a.) TTE 01/13/2021: EF 60-65%, mod LVH, triv MR, G1DD   GERD (gastroesophageal reflux disease)    Hepatic steatosis    History of alcohol abuse    History of nuclear stress test    Myoview 10/16: EF 50%, diaphragmatic attenuation, no ischemia, low risk   Hypertension    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   a.) CT head 12/25/2012 --> RIGHT basal ganglia hypoattenuation related to remote lacunar infarct   Lipoma    Long term (current) use of anticoagulants    a.) apixaban    Long-term current use of immunomodulator    a.) on azothioprine for peripheral focal chorioretinal inflammation (both eyes)   Marijuana use    Migraine    Moderate persistent asthma with acute exacerbation 05/02/2018   PAF  (paroxysmal atrial fibrillation) (HCC) 03/27/2015   a.) CHA2DS2VASc = 5 (HTN, CVA x2, vascular disease history, T2DM);  b.) s/p ablation 10/02/2015; c.) s/p ablation 12/10/2015; d.) s/p DCCV (200 J x 1) 12/11/2015; e.) s/p ablation 04/28/2022; f.) rate/rhythm maintained on oral diltiazem  + carvedilol ; chronically anticoagulated with apixaban    Pilonidal cyst    Schizophrenia (HCC)    Sciatica neuralgia    T2DM (type 2 diabetes mellitus) (HCC)    Thoracic aortic ectasia (HCC) 01/13/2021   a.) TTE 01/13/2021: Ao root 38 mm, asc Ao 39 mm   Past Surgical History:  Procedure Laterality Date   ATRIAL FIBRILLATION ABLATION  09/22/2015   ATRIAL FIBRILLATION ABLATION N/A 04/28/2022   Procedure: ATRIAL FIBRILLATION ABLATION;  Surgeon: Lei Pump, MD;  Location: [facility];  Service: Cardiovascular;  Laterality: N/A;   CATARACT EXTRACTION Bilateral    CYST EXCISION  1996-97   surgery back of head    ELECTROPHYSIOLOGIC STUDY N/A 09/22/2015   Procedure: Atrial Fibrillation Ablation;  Surgeon: Will Cortland Ding, MD;  Location: [facility];  Service: Cardiovascular;  Laterality: N/A;   ELECTROPHYSIOLOGIC STUDY N/A 12/10/2015   Procedure: Atrial Fibrillation Ablation;  Surgeon: Will Cortland Ding, MD;  Location:  [facility];  Service: Cardiovascular;  Laterality: N/A;   ELECTROPHYSIOLOGIC STUDY N/A 12/11/2015   Procedure: Cardioversion;  Surgeon: Will Cortland Ding, MD;  Location: [facility];  Service: Cardiovascular;  Laterality: N/A;   EMBOLIZATION (CATH LAB) Right 08/19/2020   Procedure: EMBOLIZATION;  Surgeon: Carlene Che, MD;  Location: [facility];  Service: Cardiovascular;  Laterality: Right;  hypogastric   EXCISION MASS HEAD N/A 01/06/2017   Procedure: EXCISION MASS FOREHEAD;  Surgeon: Alger Infield, MD;  Location: [facility];  Service: Plastics;  Laterality: N/A;   EXCISION MASS UPPER EXTREMETIES Right 08/08/2022    Procedure: EXCISION MASS RIGHT FOREARM;  Surgeon: Brunilda Capra, MD;  Location: [facility];  Service: Orthopedics;  Laterality: Right;  45 MIN   GANGLION CYST EXCISION Left    INTERCOSTAL NERVE BLOCK  07/19/2003   KNEE ARTHROSCOPY Right 07/18/2014   MASS EXCISION N/A 01/25/2021   Procedure: EXCISION SUBCUTANEOUS VS SUBFASCIAL MASS TORSO 3CM;  Surgeon: Alger Infield, MD;  Location: [facility];  Service: Plastics;  Laterality: N/A;   PILONIDAL CYST EXCISION N/A 09/13/2022   Procedure: CYST EXCISION PILONIDAL EXTENSIVE;  Surgeon: Emmalene Hare, MD;  Location: [facility];  Service: General;  Laterality: N/A;   SHOULDER ARTHROSCOPY WITH ROTATOR CUFF REPAIR AND OPEN BICEPS TENODESIS Left 07/26/2023   Procedure: LEFT SHOULDER ARTHROSCOPY WITH OPEN ROTATOR CUFF REPAIR AND OPEN BICEPS TENODESIS;  Surgeon: Adah Acron, MD;  Location: [facility];  Service: Orthopedics;  Laterality: Left;   Patient Active Problem List   Diagnosis Date Noted   Impingement syndrome of right shoulder 10/03/2023   Tendinitis of upper biceps tendon of left shoulder 06/12/2023   Complete tear of left rotator cuff 10/28/2022   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   Pilonidal cyst 09/13/2022   Aortic atherosclerosis (HCC) 06/07/2022   Hypercoagulable state due to paroxysmal atrial fibrillation (HCC) 03/23/2021   Type 2 diabetes mellitus with hyperglycemia, without long-term current use of insulin (HCC) 02/10/2021   Neck pain on left side 03/06/2019   Musculoskeletal chest pain 07/24/2018   Asthma, mild intermittent 05/02/2018   Allergic rhinitis caused by mold 05/02/2018   Tobacco use 05/02/2018   Bilateral carpal tunnel syndrome 01/10/2018   Stroke (HCC)    Schizophrenia (HCC)    Migraine    History of nuclear stress test    History of alcohol abuse    GERD (gastroesophageal reflux disease)    Dysrhythmia    Chronic lower back pain    Anxiety    Arthritis of  knee, right 04/12/2017   Bipolar disorder (HCC) 04/03/2017   Lipoma of forehead 09/22/2016   Trigger ring finger of right hand 06/22/2016   Paroxysmal atrial fibrillation (HCC)    AF (atrial fibrillation) (HCC) 12/10/2015   Eczema 07/10/2015   History of CVA (cerebrovascular accident) 05/04/2015   Tear of medial meniscus of right knee 03/18/2015   Chronic pain of right knee 03/12/2015   Other and unspecified hyperlipidemia 11/25/2013   Nasal congestion 11/25/2013   Sinusitis, chronic 05/09/2013   Chronic low back pain 10/12/2012   Lumbar radiculopathy 10/12/2012   Hypertension associated with diabetes (HCC) 10/12/2012   Depression 10/12/2012   Insomnia 10/12/2012    PCP: Joaquin Mulberry MD   REFERRING PROVIDER: Tommy Frames MD   REFERRING DIAG: M75.122 (ICD-10-CM) - Complete tear of left rotator cuff, unspecified whether traumatic  THERAPY DIAG:  Acute pain of left shoulder  Status post  left rotator cuff repair  Muscle weakness (generalized)  Rationale for Evaluation and Treatment: Rehabilitation  ONSET DATE: 07/26/23   SUBJECTIVE:        SUBJECTIVE STATEMENT:  Pain in L shoulder 7/10.  Sore from the exercises as well  Pain in Rt UE is 9/10.  Pt reports better range of motion, he says strength is improving ("getting there")  Last visit: 28 th will have a MRI on right shoulder. Will then have surgery to remove spurs.    PERTINENT HISTORY: HTN CVA DM Atrial fibrillation  PAIN:  Are you having pain? Yes: NPRS scale: 7/10 L shoulder  Pain location: deep in the shoulder joint  Pain description: sore achy and sharp Aggravating factors: holding it up, using it  Relieving factors: hot shower, massager, meds tried not to take it too much   Are you having pain? Yes: NPRS scale: 9/10 Pain location: Back and Rt shoulder is keeping me up at night.  Pain description: sore achy and sharp Aggravating factors: holding it up, using it  Relieving factors: hot shower,  massager, meds tried not to take it too much    PRECAUTIONS: Other: none   RED FLAGS: None   WEIGHT BEARING RESTRICTIONS: No  FALLS:  Has patient fallen in last 6 months? 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   PLOF: Independent with household mobility without device, Independent with community mobility without device, Needs assistance with ADLs, and Leisure: has a CNA in [place]  PATIENT GOALS:Patient wants to get ready for the highway.  Get back to fishing    OBJECTIVE:  Note: Objective measures were complet ed at Evaluation unless otherwise noted. PATIENT SURVEYS:  Cindia Crease 89 11/09/23: Quick DASH 59%   SENSATION: L biceps numb and tinging to fingers at times.  He reports this in the L UE as well.   POSTURE: Leans head to the L, shoulders elevated, shoulders rounded   UPPER EXTREMITY ROM:   Passive ROM Lt  11/02/23 11/06/23 Lt 11/09/23  Shoulder flexion 132 A 140 A AAROM 146 at wall   Shoulder extension     Shoulder abduction 80 A 130 A 120 deg   Shoulder adduction     Shoulder internal rotation   FR to upper lumbar  In abd, 65 deg IR   Shoulder external rotation   FR to back of neck  At 50 deg abd, AROM to 56 deg in ER   Elbow flexion     Elbow extension      UPPER EXTREMITY MMT: NT due to post surgical. Appears to be 3-/5  MMT Right 10/12/23 Left 10/12/23 Lt.  11/09/23  Shoulder flexion 4+/5 3-/5 3+/5  Shoulder extension     Shoulder abduction 4/5 3-/5 3-/5  Shoulder adduction     Shoulder internal rotation  4/5 5/5  Shoulder external rotation  3+/5 3+/5  Middle trapezius     Lower trapezius     Elbow flexion   4+  Elbow extension   4+  Wrist flexion     Wrist extension     Wrist ulnar deviation     Wrist radial  deviation     Wrist pronation     Wrist supination     Grip strength (lbs) 70 83   (Blank  rows = not tested)  SHOULDER SPECIAL TESTS: NT  JOINT MOBILITY TESTING:  Stiffness   PALPATION:  In TTP along Lt posterior and lateral shoulder                                                                                                                              TREATMENT DATE:   [facility] Adult PT Treatment:                                                DATE: 11/09/23 Therapeutic Activity: Wall slides in flexion abduction bilaterally for about 1 minute each Pulleys in abduction/scaption  MMT Grip strength testing in both UEs AROM L UE all planes sitting and standing   Self Care: Plan of care, home exercise program routine, visit limit with Medicaid and possible surgical procedure on the right shoulder   [facility] Adult PT Treatment:                                                DATE: 11/06/23 Therapeutic Activity Standing shoulder flexion x 10 AROM Standing shoulder abduction x 10 AROM UE ranger standing AAROM shoulder flexion and scaptionx 15 each Supine left shoulder 3# long lever flexion x 20 Supine protraction 3# x 20  Therapeutic Exercise: Left shoulder row blue x 20 Left shoulder ext Green x 20  Left shoudler IR Blue x 20 Let shouler ER blue x 20  Standing left red band horiz abdct x 15 - attached band     [facility] Adult PT Treatment:                                                DATE: 11/02/23 Left shoulder PROM Left shoulder GHJ inferior and A/P mobs  Supine shoulder flexion 2# 15 x2  Standing UE ranger for shoulder abduction Standing AA wall slide for shoulder abduction/scaption Standing shoulder abduction isometric  Row Left blue band  Shoulder extension GTB IR and ER 15 x 2 each Blue band  Standing UE ranger shoulder flexion x 15    [facility] Adult PT Treatment:                                                DATE: 10/30/23 Therapeutic Exercise: Row GTB Shoulder EXT  GTB IR and ER 10 x 2 each Blue band  Standing shoulder flex x10 Standing shoulder abdct x 5  S/L  shoulder Abdct x 10  Supine protraction 3# 15 x 2  Inclined shoulder flexion long lever 10 x 2  Inclined left shoulder horiz abduction GTB x 10 (therapist assist with band due to right shoulder pain      [facility] Adult PT Treatment:                13 wks s/p Sx                                DATE: 4/11025 Manual Therapy: STM to the L teres minor and major, and lat dorsi Skilled palpation to identify TP and taut muscle bands Trigger Point Dry Needling  Initial Treatment: Pt instructed on Dry Needling rational, procedures, and possible side effects. Pt instructed to expect mild to moderate muscle soreness later in the day and/or into the next day.  Pt instructed in methods to reduce muscle soreness. Pt instructed to continue prescribed HEP. Because Dry Needling was performed over or adjacent to a lung field, pt was educated on S/S of pneumothorax and to seek immediate medical attention should they occur.  Patient was educated on signs and symptoms of infection and other risk factors and advised to seek medical attention should they occur.  Patient verbalized understanding of these instructions and education.   Patient Verbal Consent Given: Yes Education Handout Provided: Yes Muscles Treated: L teres minor and major, and lat dorsi Electrical Stimulation Performed: No Treatment Response/Outcome: Muscle twitch  Therapeutic Activity: ROM and mobility, pain control  Pulleys scaption and overhead 2 min each during subjective  Wall wash shoulder flexion L shoulder row GTB 2x15 L shoulder ext GTB 2x10 Standing shoulder flex x10 Inclined shoulder flexion a t 70d2x10 S/L shoulder ER 3x10 1# S/L shoulder abd 2x10 thumb up 1#; 2x10 palm down 1#  [facility] Adult PT Treatment:              13 wks s/p Sx                                  DATE: 10/25/23 Therapeutic Activity: ROM and mobility, pain control   Pulleys scaption and overhead 2 min each during subjective  L shoulder row GTB 2x15 L shoulder ext GTB 2x10 Supine shoulder flex x10 S/L shoulder ER 3x10 1# S/L shoulder abd 1x10 thumb up; 2x10 palm down Inclined shoulder flexion at 60d 2x10  PATIENT EDUCATION: Education details: HEP review Person educated: Patient Education method: Programmer, multimedia, Facilities manager, Verbal cues Education comprehension: verbalized understanding and needs further education  HOME EXERCISE PROGRAM: Access Code: [email or web address] URL: [email or web address] Date: 10/27/2023 Prepared by: Liborio Reeds  Exercises - Supine Shoulder Press AAROM in Abduction with Dowel  - 1-3 x daily - 7 x weekly - 2 sets - 10 reps - 5 hold - Supine Shoulder Flexion Extension AAROM with Dowel  - 1-3 x daily - 7 x weekly - 2 sets - 10 reps - 5 hold - Supine Shoulder External Rotation with Dowel  - 1-3 x daily - 7 x weekly - 2 sets - 10 reps - 5 hold - Standing Shoulder Row with Anchored Resistance  - 1 x daily - 7 x weekly - 2 sets - 10 reps - 5 hold - Shoulder extension with resistance - Neutral  - 1 x daily - 7 x weekly - 2 sets - 10 reps - 5 hold -  Seated Shoulder External Rotation PROM on Table  - 1 x daily - 7 x weekly - 2 sets - 10 reps - 30 hold - Seated Single Arm Shoulder Full Can AROM  - 1 x daily - 7 x weekly - 2 sets - 10 reps - 5 hold - Seated Single Arm Shoulder External Rotation with Self-Anchored Resistance  - 1 x daily - 7 x weekly - 2 sets - 10 reps - 5 hold - Shoulder Flexion Serratus Activation with Resistance  - 1 x daily - 7 x weekly - 2 sets - 10 reps - 30 hold - Sidelying Shoulder ER with Towel and Dumbbell  - 1 x daily - 7 x weekly - 3 sets - 10 reps - Sidelying Shoulder Abduction Palm Forward  - 1 x daily - 7 x weekly - 3 sets - 10 reps   ASSESSMENT:  CLINICAL IMPRESSION:  Patient continues to be quite limited in his postsurgical left shoulder with respect to mobility functional use of his arm.  His  right shoulder will possibly need surgery as well.  Discussed visit with parents this year he understands that his right shoulder will probably need therapy as well patient was renewed for more visits and that he has significant weakness in all planes of motion of his left shoulder as well as pain and functional use.Aaron Aas  He arrived late today so the session was just to reevaluation with a little bit of stretching prior to testing.  Patient will continue benefit from skilled physical therapy.  MRI upcoming on April 28.   EVAL: Patient is a 63 y.o. male who was seen today for physical therapy evaluation and treatment for L shoulder surgery. He reports he has been working on moving his shoulder at home.  He is 6 weeks out from surgery.  He will transition to light strengthening but will ensure he is following MD protocol.   OBJECTIVE IMPAIRMENTS: cardiopulmonary status limiting activity, decreased mobility, decreased ROM, decreased strength, hypomobility, increased fascial restrictions, impaired flexibility, impaired sensation, impaired UE functional use, improper body mechanics, postural dysfunction, obesity, and pain.   ACTIVITY LIMITATIONS: carrying, lifting, sitting, standing, sleeping, stairs, bed mobility, bathing, dressing, reach over head, hygiene/grooming, locomotion level, and caring for others  PARTICIPATION LIMITATIONS: meal prep, cleaning, interpersonal relationship, driving, shopping, community activity, yard work, and recreation  PERSONAL FACTORS: Education, Past/current experiences, Social background, and 3+ comorbidities: HTN, CVA , DM   are also affecting patient's functional outcome.    GOALS: Goals reviewed with patient? Yes  SHORT TERM GOALS: Target date: 10/09/2023  Pt will be I with HEP for shoulder A/AROM, strength  Baseline:given on eval  Goal status: MET  2.  Pt will be able to use L arm for dressing and bathing with min difficulty.  Baseline: needs A  Goal status:  MET  3.  Pt will note pain at rest, no more than min for recreation, meals Baseline: moderate Goal status:ongoing   4.  Pt will be able to use pillows/ice/heat appropriately for pain mgmt at home.  Baseline: needs reinforcement  Goal status: MET   LONG TERM GOALS: Target date: 11/06/2023   Pt will be I with HEP for final HEP upon discharge Baseline: unknown  Goal status: ongoing   2.  Pt will be able to carry 1-3 light items in his L UE down by his side (groceries) without increased pain  Baseline:can do this now Goal status: MET  3.  Pt will be able to improve  ability to sleep/rest with only min disturbance from sleep.  Baseline: severely limited , mostly due to Rt UE  Goal status: ongoing   4.  Pt will be able to report no sensory symptoms in LUE  Baseline: moderate N/T in L UE  Goal status: ongoing   5.  DAsh will improve to 75% or better  Baseline: 89% Goal status: MET   PLAN: PT FREQUENCY: 2x/week  PT DURATION: 8 weeks  PLANNED INTERVENTIONS: 97164- PT Re-evaluation, 97110-Therapeutic exercises, 97530- Therapeutic activity, 97112- Neuromuscular re-education, 97535- Self Care, 14782- Manual therapy, Patient/Family education, Taping, Dry Needling, Joint mobilization, Cryotherapy, and Moist heat  PLAN FOR NEXT SESSION: Add to HEP. Cont.  AAROM , Light strength.   Marci Setter, PT 11/09/23 4:14 PM Phone: [phone] Fax: [phone]   For all possible CPT codes, reference the Planned Interventions line above.     Check all conditions that are expected to impact treatment: {Conditions expected to impact treatment:None of these apply   If treatment provided at initial evaluation, no treatment charged due to lack of authorization.     Marci Setter, PT 11/09/23 4:14 PM Phone: [phone] Fax: [phone]

## 2023-11-10 ENCOUNTER — Encounter: Payer: Self-pay | Admitting: Orthopedic Surgery

## 2023-11-10 DIAGNOSIS — I639 Cerebral infarction, unspecified: Secondary | ICD-10-CM | POA: Diagnosis not present

## 2023-11-11 DIAGNOSIS — I639 Cerebral infarction, unspecified: Secondary | ICD-10-CM | POA: Diagnosis not present

## 2023-11-12 DIAGNOSIS — I639 Cerebral infarction, unspecified: Secondary | ICD-10-CM | POA: Diagnosis not present

## 2023-11-13 ENCOUNTER — Inpatient Hospital Stay: Admission: RE | Admit: 2023-11-13 | Source: Ambulatory Visit

## 2023-11-13 ENCOUNTER — Other Ambulatory Visit

## 2023-11-13 DIAGNOSIS — I639 Cerebral infarction, unspecified: Secondary | ICD-10-CM | POA: Diagnosis not present

## 2023-11-14 DIAGNOSIS — I639 Cerebral infarction, unspecified: Secondary | ICD-10-CM | POA: Diagnosis not present

## 2023-11-14 NOTE — Therapy (Signed)
 OUTPATIENT PHYSICAL THERAPY TREATMENT RE-EVAL   Patient Name: Jason Stokes MRN: [id number] DOB:09-10-1960, 63 y.o., male Today's Date: 11/15/2023   END OF SESSION:  PT End of Session - 11/15/23 0933     Visit Number 14    Number of Visits 29    Date for PT Re-Evaluation 01/04/24    Authorization Type [employer]    Authorization - Number of Visits 27    PT Start Time 0934    PT Stop Time 1019    PT Time Calculation (min) 45 min    Activity Tolerance Patient tolerated treatment well    Behavior During Therapy WFL for tasks assessed/performed                      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)    a.) s/p embolization 08/19/2020: 29 mm RIGHT internal iliac artery aneurysm   Anxiety    Aortic atherosclerosis (HCC)    Bilateral carpal tunnel syndrome 01/10/2018   Bipolar disorder (HCC)    Chorioretinal inflammation of both eyes    a.) on azothioprine   Chronic lower back pain    Coronary artery calcification seen on CT scan    a.) cCTA 04/21/2022: Ca score 20.6 (61st percentile for age/sex match control)   DDD (degenerative disc disease), cervical    Depression    Diastolic dysfunction    a.) TTE 01/13/2021: EF 60-65%, mod LVH, triv MR, G1DD   GERD (gastroesophageal reflux disease)    Hepatic steatosis    History of alcohol abuse    History of nuclear stress test    Myoview 10/16: EF 50%, diaphragmatic attenuation, no ischemia, low risk   Hypertension    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   a.) CT head 12/25/2012 --> RIGHT basal ganglia hypoattenuation related to remote lacunar infarct   Lipoma    Long term (current) use of anticoagulants    a.) apixaban    Long-term current use of immunomodulator    a.) on azothioprine for peripheral focal chorioretinal inflammation (both eyes)   Marijuana use    Migraine    Moderate persistent asthma with acute exacerbation 05/02/2018   PAF (paroxysmal  atrial fibrillation) (HCC) 03/27/2015   a.) CHA2DS2VASc = 5 (HTN, CVA x2, vascular disease history, T2DM);  b.) s/p ablation 10/02/2015; c.) s/p ablation 12/10/2015; d.) s/p DCCV (200 J x 1) 12/11/2015; e.) s/p ablation 04/28/2022; f.) rate/rhythm maintained on oral diltiazem  + carvedilol ; chronically anticoagulated with apixaban    Pilonidal cyst    Schizophrenia (HCC)    Sciatica neuralgia    T2DM (type 2 diabetes mellitus) (HCC)    Thoracic aortic ectasia (HCC) 01/13/2021   a.) TTE 01/13/2021: Ao root 38 mm, asc Ao 39 mm   Past Surgical History:  Procedure Laterality Date   ATRIAL FIBRILLATION ABLATION  09/22/2015   ATRIAL FIBRILLATION ABLATION N/A 04/28/2022   Procedure: ATRIAL FIBRILLATION ABLATION;  Surgeon: Lei Pump, MD;  Location: [facility];  Service: Cardiovascular;  Laterality: N/A;   CATARACT EXTRACTION Bilateral    CYST EXCISION  1996-97   surgery back of head    ELECTROPHYSIOLOGIC STUDY N/A 09/22/2015   Procedure: Atrial Fibrillation Ablation;  Surgeon: Will Cortland Ding, MD;  Location: [facility];  Service: Cardiovascular;  Laterality: N/A;   ELECTROPHYSIOLOGIC STUDY N/A 12/10/2015   Procedure: Atrial Fibrillation Ablation;  Surgeon: Will Cortland Ding, MD;  Location: [facility]  INVASIVE CV LAB;  Service: Cardiovascular;  Laterality: N/A;   ELECTROPHYSIOLOGIC STUDY N/A 12/11/2015   Procedure: Cardioversion;  Surgeon: Will Cortland Ding, MD;  Location: [facility];  Service: Cardiovascular;  Laterality: N/A;   EMBOLIZATION (CATH LAB) Right 08/19/2020   Procedure: EMBOLIZATION;  Surgeon: Carlene Che, MD;  Location: [facility];  Service: Cardiovascular;  Laterality: Right;  hypogastric   EXCISION MASS HEAD N/A 01/06/2017   Procedure: EXCISION MASS FOREHEAD;  Surgeon: Alger Infield, MD;  Location: [facility];  Service: Plastics;  Laterality: N/A;   EXCISION MASS UPPER EXTREMETIES Right 08/08/2022   Procedure:  EXCISION MASS RIGHT FOREARM;  Surgeon: Brunilda Capra, MD;  Location: [facility];  Service: Orthopedics;  Laterality: Right;  45 MIN   GANGLION CYST EXCISION Left    INTERCOSTAL NERVE BLOCK  07/19/2003   KNEE ARTHROSCOPY Right 07/18/2014   MASS EXCISION N/A 01/25/2021   Procedure: EXCISION SUBCUTANEOUS VS SUBFASCIAL MASS TORSO 3CM;  Surgeon: Alger Infield, MD;  Location: [facility];  Service: Plastics;  Laterality: N/A;   PILONIDAL CYST EXCISION N/A 09/13/2022   Procedure: CYST EXCISION PILONIDAL EXTENSIVE;  Surgeon: Emmalene Hare, MD;  Location: [facility];  Service: General;  Laterality: N/A;   SHOULDER ARTHROSCOPY WITH ROTATOR CUFF REPAIR AND OPEN BICEPS TENODESIS Left 07/26/2023   Procedure: LEFT SHOULDER ARTHROSCOPY WITH OPEN ROTATOR CUFF REPAIR AND OPEN BICEPS TENODESIS;  Surgeon: Adah Acron, MD;  Location:  SURGERY CENTER;  Service: Orthopedics;  Laterality: Left;   Patient Active Problem List   Diagnosis Date Noted   Impingement syndrome of right shoulder 10/03/2023   Tendinitis of upper biceps tendon of left shoulder 06/12/2023   Complete tear of left rotator cuff 10/28/2022   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   Pilonidal cyst 09/13/2022   Aortic atherosclerosis (HCC) 06/07/2022   Hypercoagulable state due to paroxysmal atrial fibrillation (HCC) 03/23/2021   Type 2 diabetes mellitus with hyperglycemia, without long-term current use of insulin (HCC) 02/10/2021   Neck pain on left side 03/06/2019   Musculoskeletal chest pain 07/24/2018   Asthma, mild intermittent 05/02/2018   Allergic rhinitis caused by mold 05/02/2018   Tobacco use 05/02/2018   Bilateral carpal tunnel syndrome 01/10/2018   Stroke (HCC)    Schizophrenia (HCC)    Migraine    History of nuclear stress test    History of alcohol abuse    GERD (gastroesophageal reflux disease)    Dysrhythmia    Chronic lower back pain    Anxiety    Arthritis of knee, right  04/12/2017   Bipolar disorder (HCC) 04/03/2017   Lipoma of forehead 09/22/2016   Trigger ring finger of right hand 06/22/2016   Paroxysmal atrial fibrillation (HCC)    AF (atrial fibrillation) (HCC) 12/10/2015   Eczema 07/10/2015   History of CVA (cerebrovascular accident) 05/04/2015   Tear of medial meniscus of right knee 03/18/2015   Chronic pain of right knee 03/12/2015   Other and unspecified hyperlipidemia 11/25/2013   Nasal congestion 11/25/2013   Sinusitis, chronic 05/09/2013   Chronic low back pain 10/12/2012   Lumbar radiculopathy 10/12/2012   Hypertension associated with diabetes (HCC) 10/12/2012   Depression 10/12/2012   Insomnia 10/12/2012    PCP: Joaquin Mulberry MD   REFERRING PROVIDER: Tommy Frames MD   REFERRING DIAG: M75.122 (ICD-10-CM) - Complete tear of left rotator cuff, unspecified whether traumatic  THERAPY DIAG:  Acute pain of left shoulder  Status post left  rotator cuff repair  Muscle weakness (generalized)  Stiffness of left shoulder, not elsewhere classified  Rationale for Evaluation and Treatment: Rehabilitation  ONSET DATE: 07/26/23   SUBJECTIVE:        SUBJECTIVE STATEMENT:  Pain in L shoulder 7/10.  Pt reports the muscles of his lateral shoulder blade are tender. TPDN completed there c ouple of weeks ago was helpful.  Pain in Rt UE is 9/10. Because of issue with insurance the MRI for his R shoulder has been delayed   PERTINENT HISTORY: HTN CVA DM Atrial fibrillation  PAIN:  Are you having pain? Yes: NPRS scale: 7/10 L shoulder  Pain location: deep in the shoulder joint  Pain description: sore achy and sharp Aggravating factors: holding it up, using it  Relieving factors: hot shower, massager, meds tried not to take it too much   Are you having pain? Yes: NPRS scale: 9/10 Pain location: Back and Rt shoulder is keeping me up at night.  Pain description: sore achy and sharp Aggravating factors: holding it up, using it  Relieving  factors: hot shower, massager, meds tried not to take it too much    PRECAUTIONS: Other: none   RED FLAGS: None   WEIGHT BEARING RESTRICTIONS: No  FALLS:  Has patient fallen in last 6 months? 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   PLOF: Independent with household mobility without device, Independent with community mobility without device, Needs assistance with ADLs, and Leisure: has a CNA in [place]  PATIENT GOALS:Patient wants to get ready for the highway.  Get back to fishing    OBJECTIVE:  Note: Objective measures were complet ed at Evaluation unless otherwise noted. PATIENT SURVEYS:  Cindia Crease 89 11/09/23: Quick DASH 59%   SENSATION: L biceps numb and tinging to fingers at times.  He reports this in the L UE as well.   POSTURE: Leans head to the L, shoulders elevated, shoulders rounded   UPPER EXTREMITY ROM:   Passive ROM Lt  11/02/23 11/06/23 Lt 11/09/23  Shoulder flexion 132 A 140 A AAROM 146 at wall   Shoulder extension     Shoulder abduction 80 A 130 A 120 deg   Shoulder adduction     Shoulder internal rotation   FR to upper lumbar  In abd, 65 deg IR   Shoulder external rotation   FR to back of neck  At 50 deg abd, AROM to 56 deg in ER   Elbow flexion     Elbow extension      UPPER EXTREMITY MMT: NT due to post surgical. Appears to be 3-/5  MMT Right 10/12/23 Left 10/12/23 Lt.  11/09/23  Shoulder flexion 4+/5 3-/5 3+/5  Shoulder extension     Shoulder abduction 4/5 3-/5 3-/5  Shoulder adduction     Shoulder internal rotation  4/5 5/5  Shoulder external rotation  3+/5 3+/5  Middle trapezius     Lower trapezius     Elbow flexion   4+  Elbow extension   4+  Wrist flexion     Wrist extension     Wrist ulnar deviation      Wrist radial deviation     Wrist pronation     Wrist supination     Grip strength (lbs) 70 83   (  Blank rows = not tested)  SHOULDER SPECIAL TESTS: NT  JOINT MOBILITY TESTING:  Stiffness   PALPATION:  In TTP along Lt posterior and lateral shoulder                                                                                                                              TREATMENT DATE:  [facility] Adult PT Treatment:                                                DATE: 11/15/23 Manual Therapy: STNM to the L teres minor and major, and lat dorsi Skilled palpation to identify Tps and taut muscle bands Trigger Point Dry Needling  Subsequent Treatment: Instructions provided previously at initial dry needling treatment.   Patient Verbal Consent Given: Yes Education Handout Provided: Previously Provided Muscles Treated: L teres minor and major, lat dorsi Electrical Stimulation Performed: No Treatment Response/Outcome: Muscle twitch response   Therapeutic Activity Standing shoulder flexion x 10 AROM Standing shoulder abduction x 10 AROM Supine left shoulder 3# long lever flexion x 20 Supine protraction 3x10 5# S/L Left shoulder ER 3# 3x10, limited ER ROM Standing lat pull FM 3x10 20# Standing chest pull FM 3x10 20# Therapeutic Exercise: Left shoudler IR Blue x 20 Left shoudler ER GTB x 20 Standing L shoulder ER stretch 3x 20" Standing L shoulder Lat Dorsi stretch 3x 20"  [facility] Adult PT Treatment:                                                DATE: 11/09/23 Therapeutic Activity: Wall slides in flexion abduction bilaterally for about 1 minute each Pulleys in abduction/scaption  MMT Grip strength testing in both UEs AROM L UE all planes sitting and standing   Self Care: Plan of care, home exercise program routine, visit limit with Medicaid and possible surgical procedure on the right shoulder   [facility] Adult PT Treatment:                                                DATE:  11/06/23 Therapeutic Activity Standing shoulder flexion x 10 AROM Standing shoulder abduction x 10 AROM UE ranger standing AAROM shoulder flexion and scaptionx 15 each Supine left shoulder 3# long lever flexion x 20 Supine protraction 3# x 20  Therapeutic Exercise: Left shoulder row blue x 20 Left shoulder ext Green x 20  Left shoudler IR Blue x 20 Let shouler ER blue x 20  Standing left red band horiz abdct x 15 - attached band  PATIENT EDUCATION: Education details: HEP review Person educated: Patient Education method: Programmer, multimedia, Facilities manager, Verbal cues Education comprehension: verbalized understanding and needs further education  HOME EXERCISE PROGRAM: Access Code: [email or web address] URL: [email or web address] Date: 10/27/2023 Prepared by: Liborio Reeds  Exercises - Supine Shoulder Press AAROM in Abduction with Dowel  - 1-3 x daily - 7 x weekly - 2 sets - 10 reps - 5 hold - Supine Shoulder Flexion Extension AAROM with Dowel  - 1-3 x daily - 7 x weekly - 2 sets - 10 reps - 5 hold - Supine Shoulder External Rotation with Dowel  - 1-3 x daily - 7 x weekly - 2 sets - 10 reps - 5 hold - Standing Shoulder Row with Anchored Resistance  - 1 x daily - 7 x weekly - 2 sets - 10 reps - 5 hold - Shoulder extension with resistance - Neutral  - 1 x daily - 7 x weekly - 2 sets - 10 reps - 5 hold - Seated Shoulder External Rotation PROM on Table  - 1 x daily - 7 x weekly - 2 sets - 10 reps - 30 hold - Seated Single Arm Shoulder Full Can AROM  - 1 x daily - 7 x weekly - 2 sets - 10 reps - 5 hold - Seated Single Arm Shoulder External Rotation with Self-Anchored Resistance  - 1 x daily - 7 x weekly - 2 sets - 10 reps - 5 hold - Shoulder Flexion Serratus Activation with Resistance  - 1 x daily - 7 x weekly - 2 sets - 10 reps - 30 hold - Sidelying Shoulder ER with Towel and Dumbbell  - 1 x daily - 7 x weekly - 3 sets - 10 reps - Sidelying Shoulder Abduction Palm Forward  - 1 x daily - 7 x  weekly - 3 sets - 10 reps   ASSESSMENT:  CLINICAL IMPRESSION:  PT was completed for STM to the teres' muscles and lat dorsi f/b TPDN with twitch responses elicited. Exercises for muscle facilitation with flexibility and strengthening of the L shoulder were then completed. Pt reports his L lateral scapular area felt better after the session. Pt tolerated PT today without adverse effects. Pt will continue to benefit from skilled PT to address L shouder impairments for improved function.    EVAL: Patient is a 63 y.o. male who was seen today for physical therapy evaluation and treatment for L shoulder surgery. He reports he has been working on moving his shoulder at home.  He is 6 weeks out from surgery.  He will transition to light strengthening but will ensure he is following MD protocol.   OBJECTIVE IMPAIRMENTS: cardiopulmonary status limiting activity, decreased mobility, decreased ROM, decreased strength, hypomobility, increased fascial restrictions, impaired flexibility, impaired sensation, impaired UE functional use, improper body mechanics, postural dysfunction, obesity, and pain.   ACTIVITY LIMITATIONS: carrying, lifting, sitting, standing, sleeping, stairs, bed mobility, bathing, dressing, reach over head, hygiene/grooming, locomotion level, and caring for others  PARTICIPATION LIMITATIONS: meal prep, cleaning, interpersonal relationship, driving, shopping, community activity, yard work, and recreation  PERSONAL FACTORS: Education, Past/current experiences, Social background, and 3+ comorbidities: HTN, CVA , DM   are also affecting patient's functional outcome.    GOALS: Goals reviewed with patient? Yes  SHORT TERM GOALS: Target date: 10/09/2023  Pt will be I with HEP for shoulder A/AROM, strength  Baseline:given on eval  Goal status: MET  2.  Pt will be able to use L arm for dressing and bathing with  min difficulty.  Baseline: needs A  Goal status: MET  3.  Pt will note pain at  rest, no more than min for recreation, meals Baseline: moderate Goal status:ongoing   4.  Pt will be able to use pillows/ice/heat appropriately for pain mgmt at home.  Baseline: needs reinforcement  Goal status: MET   LONG TERM GOALS: Target date: 11/06/2023   Pt will be I with HEP for final HEP upon discharge Baseline: unknown  Goal status: ongoing   2.  Pt will be able to carry 1-3 light items in his L UE down by his side (groceries) without increased pain  Baseline:can do this now Goal status: MET  3.  Pt will be able to improve ability to sleep/rest with only min disturbance from sleep.  Baseline: severely limited , mostly due to Rt UE  Goal status: ongoing   4.  Pt will be able to report no sensory symptoms in LUE  Baseline: moderate N/T in L UE  Goal status: ongoing   5.  DAsh will improve to 75% or better  Baseline: 89% Goal status: MET   PLAN: PT FREQUENCY: 2x/week  PT DURATION: 8 weeks  PLANNED INTERVENTIONS: 97164- PT Re-evaluation, 97110-Therapeutic exercises, 97530- Therapeutic activity, 97112- Neuromuscular re-education, 97535- Self Care, 14782- Manual therapy, Patient/Family education, Taping, Dry Needling, Joint mobilization, Cryotherapy, and Moist heat  PLAN FOR NEXT SESSION: Add to HEP. Cont.  AAROM , Light strength.     Munachimso Rigdon MS, PT 11/15/23 10:37 AM

## 2023-11-15 ENCOUNTER — Ambulatory Visit

## 2023-11-15 DIAGNOSIS — M6281 Muscle weakness (generalized): Secondary | ICD-10-CM

## 2023-11-15 DIAGNOSIS — Z9889 Other specified postprocedural states: Secondary | ICD-10-CM

## 2023-11-15 DIAGNOSIS — M25512 Pain in left shoulder: Secondary | ICD-10-CM | POA: Diagnosis not present

## 2023-11-15 DIAGNOSIS — I639 Cerebral infarction, unspecified: Secondary | ICD-10-CM | POA: Diagnosis not present

## 2023-11-15 DIAGNOSIS — M25612 Stiffness of left shoulder, not elsewhere classified: Secondary | ICD-10-CM

## 2023-11-16 DIAGNOSIS — I639 Cerebral infarction, unspecified: Secondary | ICD-10-CM | POA: Diagnosis not present

## 2023-11-17 ENCOUNTER — Ambulatory Visit: Admitting: Physical Therapy

## 2023-11-17 DIAGNOSIS — I639 Cerebral infarction, unspecified: Secondary | ICD-10-CM | POA: Diagnosis not present

## 2023-11-18 DIAGNOSIS — I639 Cerebral infarction, unspecified: Secondary | ICD-10-CM | POA: Diagnosis not present

## 2023-11-19 DIAGNOSIS — I639 Cerebral infarction, unspecified: Secondary | ICD-10-CM | POA: Diagnosis not present

## 2023-11-19 NOTE — Therapy (Signed)
 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)    a.) s/p embolization 08/19/2020: 29 mm RIGHT internal iliac artery aneurysm   Anxiety    Aortic atherosclerosis (HCC)    Bilateral carpal tunnel syndrome 01/10/2018   Bipolar disorder (HCC)    Chorioretinal inflammation of both eyes    a.) on azothioprine   Chronic lower back pain    Coronary artery calcification seen on CT scan    a.) cCTA 04/21/2022: Ca score 20.6 (61st percentile for age/sex match control)   DDD (degenerative disc disease), cervical    Depression    Diastolic dysfunction    a.) TTE 01/13/2021: EF 60-65%, mod LVH, triv MR, G1DD   GERD (gastroesophageal reflux disease)    Hepatic steatosis    History of alcohol abuse    History of nuclear stress test    Myoview 10/16: EF 50%, diaphragmatic attenuation, no ischemia, low risk   Hypertension    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   a.) CT head 12/25/2012 --> RIGHT basal ganglia hypoattenuation related to remote lacunar infarct   Lipoma    Long term (current) use of anticoagulants    a.) apixaban    Long-term current use of immunomodulator    a.) on azothioprine for peripheral focal chorioretinal inflammation (both eyes)   Marijuana use    Migraine    Moderate persistent asthma with acute exacerbation 05/02/2018   PAF (paroxysmal atrial fibrillation) (HCC) 03/27/2015   a.) CHA2DS2VASc = 5 (HTN, CVA x2, vascular disease history, T2DM);  b.) s/p ablation 10/02/2015; c.) s/p ablation 12/10/2015; d.) s/p DCCV (200 J x 1) 12/11/2015; e.) s/p ablation 04/28/2022; f.) rate/rhythm maintained on oral diltiazem  + carvedilol ; chronically anticoagulated with apixaban    Pilonidal cyst    Schizophrenia (HCC)    Sciatica neuralgia     T2DM (type 2 diabetes mellitus) (HCC)    Thoracic aortic ectasia (HCC) 01/13/2021   a.) TTE 01/13/2021: Ao root 38 mm, asc Ao 39 mm   Past Surgical History:  Procedure Laterality Date   ATRIAL FIBRILLATION ABLATION  09/22/2015   ATRIAL FIBRILLATION ABLATION N/A 04/28/2022   Procedure: ATRIAL FIBRILLATION ABLATION;  Surgeon: Lei Pump, MD;  Location: [facility];  Service: Cardiovascular;  Laterality: N/A;   CATARACT EXTRACTION Bilateral    CYST EXCISION  1996-97   surgery back of head    ELECTROPHYSIOLOGIC STUDY N/A 09/22/2015   Procedure: Atrial Fibrillation Ablation;  Surgeon: Will Cortland Ding, MD;  Location: [facility];  Service: Cardiovascular;  Laterality: N/A;   ELECTROPHYSIOLOGIC STUDY N/A 12/10/2015   Procedure: Atrial Fibrillation Ablation;  Surgeon: Will Cortland Ding, MD;  Location: [facility];  Service: Cardiovascular;  Laterality: N/A;   ELECTROPHYSIOLOGIC STUDY N/A 12/11/2015   Procedure: Cardioversion;  Surgeon: Will Cortland Ding, MD;  Location: [facility];  Service: Cardiovascular;  Laterality: N/A;   EMBOLIZATION (CATH LAB) Right 08/19/2020   Procedure: EMBOLIZATION;  Surgeon: Carlene Che, MD;  Location: [facility];  Service: Cardiovascular;  Laterality: Right;  hypogastric   EXCISION MASS HEAD N/A 01/06/2017   Procedure: EXCISION MASS FOREHEAD;  Surgeon: Alger Infield, MD;  Location: [facility];  Service: Plastics;  Laterality:  N/A;   EXCISION MASS UPPER EXTREMETIES Right 08/08/2022   Procedure: EXCISION MASS RIGHT FOREARM;  Surgeon: Brunilda Capra, MD;  Location: [facility];  Service: Orthopedics;  Laterality: Right;  45 MIN   GANGLION CYST EXCISION Left    INTERCOSTAL NERVE BLOCK  07/19/2003   KNEE ARTHROSCOPY Right 07/18/2014   MASS EXCISION N/A 01/25/2021   Procedure: EXCISION SUBCUTANEOUS VS SUBFASCIAL MASS TORSO 3CM;  Surgeon: Alger Infield, MD;  Location: [facility];  Service: Plastics;  Laterality: N/A;   PILONIDAL CYST EXCISION N/A 09/13/2022   Procedure: CYST EXCISION PILONIDAL EXTENSIVE;  Surgeon: Emmalene Hare, MD;  Location: [facility];  Service: General;  Laterality: N/A;   SHOULDER ARTHROSCOPY WITH ROTATOR CUFF REPAIR AND OPEN BICEPS TENODESIS Left 07/26/2023   Procedure: LEFT SHOULDER ARTHROSCOPY WITH OPEN ROTATOR CUFF REPAIR AND OPEN BICEPS TENODESIS;  Surgeon: Adah Acron, MD;  Location:  SURGERY CENTER;  Service: Orthopedics;  Laterality: Left;   Patient Active Problem List   Diagnosis Date Noted   Impingement syndrome of right shoulder 10/03/2023   Tendinitis of upper biceps tendon of left shoulder 06/12/2023   Complete tear of left rotator cuff 10/28/2022   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   Pilonidal cyst 09/13/2022   Aortic atherosclerosis (HCC) 06/07/2022   Hypercoagulable state due to paroxysmal atrial fibrillation (HCC) 03/23/2021   Type 2 diabetes mellitus with hyperglycemia, without long-term current use of insulin (HCC) 02/10/2021   Neck pain on left side 03/06/2019   Musculoskeletal chest pain 07/24/2018   Asthma, mild intermittent 05/02/2018   Allergic rhinitis caused by mold 05/02/2018   Tobacco use 05/02/2018   Bilateral carpal tunnel syndrome 01/10/2018   Stroke (HCC)    Schizophrenia (HCC)    Migraine    History of nuclear stress test    History of alcohol abuse    GERD (gastroesophageal reflux disease)    Dysrhythmia    Chronic lower back pain    Anxiety    Arthritis of knee, right 04/12/2017   Bipolar disorder (HCC) 04/03/2017   Lipoma of forehead 09/22/2016   Trigger ring finger of right hand 06/22/2016   Paroxysmal atrial fibrillation (HCC)    AF (atrial fibrillation) (HCC) 12/10/2015   Eczema 07/10/2015   History of CVA (cerebrovascular accident) 05/04/2015   Tear of medial meniscus of right knee 03/18/2015   Chronic pain of right knee 03/12/2015   Other and  unspecified hyperlipidemia 11/25/2013   Nasal congestion 11/25/2013   Sinusitis, chronic 05/09/2013   Chronic low back pain 10/12/2012   Lumbar radiculopathy 10/12/2012   Hypertension associated with diabetes (HCC) 10/12/2012   Depression 10/12/2012   Insomnia 10/12/2012    PCP: Joaquin Mulberry MD   REFERRING PROVIDER: Tommy Frames MD   REFERRING DIAG: [phone] (ICD-10-CM) - Complete tear of left rotator cuff, unspecified whether traumatic  THERAPY DIAG:  No diagnosis found.  Rationale for Evaluation and Treatment: Rehabilitation  ONSET DATE: 07/26/23   SUBJECTIVE:        SUBJECTIVE STATEMENT:  Pain in L shoulder 7/10.  Pt reports the muscles of his lateral shoulder blade are tender. TPDN completed there c ouple of weeks ago was helpful.  Pain in Rt UE is 9/10. Because of issue with insurance the MRI for his R shoulder has been delayed   PERTINENT HISTORY: HTN CVA DM Atrial fibrillation  PAIN:  Are you having pain? Yes: NPRS scale: 7/10 L shoulder  Pain location: deep in the shoulder joint  Pain description: sore achy and sharp Aggravating factors: holding it up, using it  Relieving factors: hot shower, massager, meds tried not to take it too much   Are you having pain? Yes: NPRS scale: 9/10 Pain location: Back and Rt shoulder is keeping me up at night.  Pain description: sore achy and sharp Aggravating factors: holding it up, using it  Relieving factors: hot shower, massager, meds tried not to take it too much    PRECAUTIONS: Other: none   RED FLAGS: None   WEIGHT BEARING RESTRICTIONS: No  FALLS:  Has patient fallen in last 6 months? 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   PLOF: Independent with household mobility without  device, Independent with community mobility without device, Needs assistance with ADLs, and Leisure: has a CNA in [place]  PATIENT GOALS:Patient wants to get ready for the highway.  Get back to fishing    OBJECTIVE:  Note: Objective measures were complet ed at Evaluation unless otherwise noted. PATIENT SURVEYS:  Cindia Crease 89 11/09/23: Quick DASH 59%   SENSATION: L biceps numb and tinging to fingers at times.  He reports this in the L UE as well.   POSTURE: Leans head to the L, shoulders elevated, shoulders rounded   UPPER EXTREMITY ROM:   Passive ROM Lt  11/02/23 11/06/23 Lt 11/09/23  Shoulder flexion 132 A 140 A AAROM 146 at wall   Shoulder extension     Shoulder abduction 80 A 130 A 120 deg   Shoulder adduction     Shoulder internal rotation   FR to upper lumbar  In abd, 65 deg IR   Shoulder external rotation   FR to back of neck  At 50 deg abd, AROM to 56 deg in ER   Elbow flexion     Elbow extension      UPPER EXTREMITY MMT: NT due to post surgical. Appears to be 3-/5  MMT Right 10/12/23 Left 10/12/23 Lt.  11/09/23  Shoulder flexion 4+/5 3-/5 3+/5  Shoulder extension     Shoulder abduction 4/5 3-/5 3-/5  Shoulder adduction     Shoulder internal rotation  4/5 5/5  Shoulder external rotation  3+/5 3+/5  Middle trapezius     Lower trapezius     Elbow flexion   4+  Elbow extension   4+  Wrist flexion     Wrist extension     Wrist ulnar deviation     Wrist radial deviation     Wrist pronation     Wrist supination     Grip strength (lbs) 70 83   (Blank rows = not tested)  SHOULDER SPECIAL TESTS: NT  JOINT MOBILITY TESTING:  Stiffness   PALPATION:  In TTP along Lt posterior and lateral shoulder  TREATMENT DATE:  [facility] Adult PT Treatment:                                                DATE: *** Manual Therapy: STNM to the L teres minor and  major, and lat dorsi Skilled palpation to identify Tps and taut muscle bands Trigger Point Dry Needling  Subsequent Treatment: Instructions provided previously at initial dry needling treatment.   Patient Verbal Consent Given: Yes Education Handout Provided: Previously Provided Muscles Treated: L teres minor and major, lat dorsi Electrical Stimulation Performed: No Treatment Response/Outcome: Muscle twitch response   Therapeutic Activity Standing shoulder flexion x 10 AROM Standing shoulder abduction x 10 AROM Supine left shoulder 3# long lever flexion x 20 Supine protraction 3x10 5# S/L Left shoulder ER 3# 3x10, limited ER ROM Standing lat pull FM 3x10 20# Standing chest pull FM 3x10 20# Therapeutic Exercise: Left shoudler IR Blue x 20 Left shoudler ER GTB x 20 Standing L shoulder ER stretch 3x 20" Standing L shoulder Lat Dorsi stretch 3x 20" Therapeutic Exercise: *** Manual Therapy: *** Neuromuscular re-ed: *** Therapeutic Activity: *** Modalities: *** Self Care: ***   [facility] Adult PT Treatment:                                                DATE: 11/15/23 Manual Therapy: STNM to the L teres minor and major, and lat dorsi Skilled palpation to identify Tps and taut muscle bands Trigger Point Dry Needling  Subsequent Treatment: Instructions provided previously at initial dry needling treatment.   Patient Verbal Consent Given: Yes Education Handout Provided: Previously Provided Muscles Treated: L teres minor and major, lat dorsi Electrical Stimulation Performed: No Treatment Response/Outcome: Muscle twitch response   Therapeutic Activity Standing shoulder flexion x 10 AROM Standing shoulder abduction x 10 AROM Supine left shoulder 3# long lever flexion x 20 Supine protraction 3x10 5# S/L Left shoulder ER 3# 3x10, limited ER ROM Standing lat pull FM 3x10 20# Standing chest pull FM 3x10 20# Therapeutic Exercise: Left shoudler IR Blue x 20 Left shoudler ER GTB  x 20 Standing L shoulder ER stretch 3x 20" Standing L shoulder Lat Dorsi stretch 3x 20"  [facility] Adult PT Treatment:                                                DATE: 11/09/23 Therapeutic Activity: Wall slides in flexion abduction bilaterally for about 1 minute each Pulleys in abduction/scaption  MMT Grip strength testing in both UEs AROM L UE all planes sitting and standing   Self Care: Plan of care, home exercise program routine, visit limit with Medicaid and possible surgical procedure on the right shoulder  PATIENT EDUCATION: Education details: HEP review Person educated: Patient Education method: Programmer, multimedia, Demonstration, Verbal cues Education comprehension: verbalized understanding and needs further education  HOME EXERCISE PROGRAM: Access Code: [email or web address] URL: [email or web address] Date: 10/27/2023 Prepared by: Liborio Reeds  Exercises - Supine Shoulder Press AAROM in Abduction with Dowel  - 1-3 x daily - 7 x weekly - 2 sets - 10 reps - 5 hold -  Supine Shoulder Flexion Extension AAROM with Dowel  - 1-3 x daily - 7 x weekly - 2 sets - 10 reps - 5 hold - Supine Shoulder External Rotation with Dowel  - 1-3 x daily - 7 x weekly - 2 sets - 10 reps - 5 hold - Standing Shoulder Row with Anchored Resistance  - 1 x daily - 7 x weekly - 2 sets - 10 reps - 5 hold - Shoulder extension with resistance - Neutral  - 1 x daily - 7 x weekly - 2 sets - 10 reps - 5 hold - Seated Shoulder External Rotation PROM on Table  - 1 x daily - 7 x weekly - 2 sets - 10 reps - 30 hold - Seated Single Arm Shoulder Full Can AROM  - 1 x daily - 7 x weekly - 2 sets - 10 reps - 5 hold - Seated Single Arm Shoulder External Rotation with Self-Anchored Resistance  - 1 x daily - 7 x weekly - 2 sets - 10 reps - 5 hold - Shoulder Flexion Serratus Activation with Resistance  - 1 x daily - 7 x weekly - 2 sets - 10 reps - 30 hold - Sidelying Shoulder ER with Towel and Dumbbell  - 1 x daily - 7 x weekly - 3  sets - 10 reps - Sidelying Shoulder Abduction Palm Forward  - 1 x daily - 7 x weekly - 3 sets - 10 reps   ASSESSMENT:  CLINICAL IMPRESSION:  PT was completed for STM to the teres' muscles and lat dorsi f/b TPDN with twitch responses elicited. Exercises for muscle facilitation with flexibility and strengthening of the L shoulder were then completed. Pt reports his L lateral scapular area felt better after the session. Pt tolerated PT today without adverse effects. Pt will continue to benefit from skilled PT to address L shouder impairments for improved function.    EVAL: Patient is a 63 y.o. male who was seen today for physical therapy evaluation and treatment for L shoulder surgery. He reports he has been working on moving his shoulder at home.  He is 6 weeks out from surgery.  He will transition to light strengthening but will ensure he is following MD protocol.   OBJECTIVE IMPAIRMENTS: cardiopulmonary status limiting activity, decreased mobility, decreased ROM, decreased strength, hypomobility, increased fascial restrictions, impaired flexibility, impaired sensation, impaired UE functional use, improper body mechanics, postural dysfunction, obesity, and pain.   ACTIVITY LIMITATIONS: carrying, lifting, sitting, standing, sleeping, stairs, bed mobility, bathing, dressing, reach over head, hygiene/grooming, locomotion level, and caring for others  PARTICIPATION LIMITATIONS: meal prep, cleaning, interpersonal relationship, driving, shopping, community activity, yard work, and recreation  PERSONAL FACTORS: Education, Past/current experiences, Social background, and 3+ comorbidities: HTN, CVA , DM   are also affecting patient's functional outcome.    GOALS: Goals reviewed with patient? Yes  SHORT TERM GOALS: Target date: 10/09/2023  Pt will be I with HEP for shoulder A/AROM, strength  Baseline:given on eval  Goal status: MET  2.  Pt will be able to use L arm for dressing and bathing with min  difficulty.  Baseline: needs A  Goal status: MET  3.  Pt will note pain at rest, no more than min for recreation, meals Baseline: moderate Goal status:ongoing   4.  Pt will be able to use pillows/ice/heat appropriately for pain mgmt at home.  Baseline: needs reinforcement  Goal status: MET   LONG TERM GOALS: Target date: 11/06/2023   Pt will  be I with HEP for final HEP upon discharge Baseline: unknown  Goal status: ongoing   2.  Pt will be able to carry 1-3 light items in his L UE down by his side (groceries) without increased pain  Baseline:can do this now Goal status: MET  3.  Pt will be able to improve ability to sleep/rest with only min disturbance from sleep.  Baseline: severely limited , mostly due to Rt UE  Goal status: ongoing   4.  Pt will be able to report no sensory symptoms in LUE  Baseline: moderate N/T in L UE  Goal status: ongoing   5.  DAsh will improve to 75% or better  Baseline: 89% Goal status: MET   PLAN: PT FREQUENCY: 2x/week  PT DURATION: 8 weeks  PLANNED INTERVENTIONS: 97164- PT Re-evaluation, 97110-Therapeutic exercises, 97530- Therapeutic activity, 97112- Neuromuscular re-education, 97535- Self Care, 84132- Manual therapy, Patient/Family education, Taping, Dry Needling, Joint mobilization, Cryotherapy, and Moist heat  PLAN FOR NEXT SESSION: Add to HEP. Cont.  AAROM , Light strength.     Jason Fluegel MS, PT 11/19/23 9:46 PM

## 2023-11-20 DIAGNOSIS — I639 Cerebral infarction, unspecified: Secondary | ICD-10-CM | POA: Diagnosis not present

## 2023-11-21 ENCOUNTER — Telehealth: Payer: Self-pay | Admitting: Radiology

## 2023-11-21 ENCOUNTER — Ambulatory Visit: Attending: Orthopaedic Surgery

## 2023-11-21 DIAGNOSIS — M25512 Pain in left shoulder: Secondary | ICD-10-CM | POA: Insufficient documentation

## 2023-11-21 DIAGNOSIS — M25612 Stiffness of left shoulder, not elsewhere classified: Secondary | ICD-10-CM | POA: Insufficient documentation

## 2023-11-21 DIAGNOSIS — Z9889 Other specified postprocedural states: Secondary | ICD-10-CM | POA: Diagnosis present

## 2023-11-21 DIAGNOSIS — M6281 Muscle weakness (generalized): Secondary | ICD-10-CM | POA: Diagnosis present

## 2023-11-21 DIAGNOSIS — I639 Cerebral infarction, unspecified: Secondary | ICD-10-CM | POA: Diagnosis not present

## 2023-11-21 NOTE — Telephone Encounter (Signed)
 Patient left voicemail requesting return call in regards to whether MRI has been approved. I called and explained that Terri had checked on this yesterday and it was still pending. He states that he is on the phone with his insurance company and they are stating that he is approved. Terri called the [employer] and got authorization number. Patient aware it has been approved and [facility] will call to get him scheduled. Also advised he could call them if he wishes.

## 2023-11-22 ENCOUNTER — Ambulatory Visit (INDEPENDENT_AMBULATORY_CARE_PROVIDER_SITE_OTHER): Payer: Self-pay

## 2023-11-22 DIAGNOSIS — I639 Cerebral infarction, unspecified: Secondary | ICD-10-CM | POA: Diagnosis not present

## 2023-11-22 DIAGNOSIS — J309 Allergic rhinitis, unspecified: Secondary | ICD-10-CM | POA: Diagnosis not present

## 2023-11-23 ENCOUNTER — Ambulatory Visit: Admitting: Physical Therapy

## 2023-11-23 ENCOUNTER — Encounter: Payer: Self-pay | Admitting: Physical Therapy

## 2023-11-23 DIAGNOSIS — I639 Cerebral infarction, unspecified: Secondary | ICD-10-CM | POA: Diagnosis not present

## 2023-11-23 DIAGNOSIS — Z9889 Other specified postprocedural states: Secondary | ICD-10-CM

## 2023-11-23 DIAGNOSIS — M25512 Pain in left shoulder: Secondary | ICD-10-CM | POA: Diagnosis not present

## 2023-11-23 NOTE — Therapy (Signed)
 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)    a.) s/p embolization 08/19/2020: 29 mm RIGHT internal iliac artery aneurysm   Anxiety    Aortic atherosclerosis (HCC)    Bilateral carpal tunnel syndrome 01/10/2018   Bipolar disorder (HCC)    Chorioretinal inflammation of both eyes    a.) on azothioprine   Chronic lower back pain    Coronary artery calcification seen on CT scan    a.) cCTA 04/21/2022: Ca score 20.6 (61st percentile for age/sex match control)   DDD (degenerative disc disease), cervical    Depression    Diastolic dysfunction    a.) TTE 01/13/2021: EF 60-65%, mod LVH, triv MR, G1DD   GERD (gastroesophageal reflux disease)    Hepatic steatosis    History of alcohol abuse    History of nuclear stress test    Myoview 10/16: EF 50%, diaphragmatic attenuation, no ischemia, low risk   Hypertension    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   a.) CT head 12/25/2012 --> RIGHT basal ganglia hypoattenuation related to remote lacunar infarct   Lipoma    Long term (current) use of anticoagulants    a.) apixaban    Long-term current use of immunomodulator    a.) on azothioprine for peripheral focal chorioretinal inflammation (both eyes)   Marijuana use    Migraine    Moderate persistent asthma with acute exacerbation 05/02/2018   PAF (paroxysmal atrial fibrillation) (HCC) 03/27/2015   a.) CHA2DS2VASc = 5 (HTN, CVA x2, vascular  disease history, T2DM);  b.) s/p ablation 10/02/2015; c.) s/p ablation 12/10/2015; d.) s/p DCCV (200 J x 1) 12/11/2015; e.) s/p ablation 04/28/2022; f.) rate/rhythm maintained on oral diltiazem  + carvedilol ; chronically anticoagulated with apixaban    Pilonidal cyst    Schizophrenia (HCC)    Sciatica neuralgia    T2DM (type 2 diabetes mellitus) (HCC)    Thoracic aortic ectasia (HCC) 01/13/2021   a.) TTE 01/13/2021: Ao root 38 mm, asc Ao 39 mm   Past Surgical History:  Procedure Laterality Date   ATRIAL FIBRILLATION ABLATION  09/22/2015   ATRIAL FIBRILLATION ABLATION N/A 04/28/2022   Procedure: ATRIAL FIBRILLATION ABLATION;  Surgeon: Lei Pump, MD;  Location: [facility];  Service: Cardiovascular;  Laterality: N/A;   CATARACT EXTRACTION Bilateral    CYST EXCISION  1996-97   surgery back of head    ELECTROPHYSIOLOGIC STUDY N/A 09/22/2015   Procedure: Atrial Fibrillation Ablation;  Surgeon: Will Cortland Ding, MD;  Location: [facility];  Service: Cardiovascular;  Laterality: N/A;   ELECTROPHYSIOLOGIC STUDY N/A 12/10/2015   Procedure: Atrial Fibrillation Ablation;  Surgeon: Will Cortland Ding, MD;  Location: [facility];  Service: Cardiovascular;  Laterality: N/A;  ELECTROPHYSIOLOGIC STUDY N/A 12/11/2015   Procedure: Cardioversion;  Surgeon: Will Cortland Ding, MD;  Location: [facility];  Service: Cardiovascular;  Laterality: N/A;   EMBOLIZATION (CATH LAB) Right 08/19/2020   Procedure: EMBOLIZATION;  Surgeon: Carlene Che, MD;  Location: [facility];  Service: Cardiovascular;  Laterality: Right;  hypogastric   EXCISION MASS HEAD N/A 01/06/2017   Procedure: EXCISION MASS FOREHEAD;  Surgeon: Alger Infield, MD;  Location: [facility];  Service: Plastics;  Laterality: N/A;   EXCISION MASS UPPER EXTREMETIES Right 08/08/2022   Procedure: EXCISION MASS RIGHT FOREARM;  Surgeon: Brunilda Capra, MD;  Location: [facility];  Service: Orthopedics;  Laterality: Right;  45 MIN   GANGLION CYST EXCISION Left    INTERCOSTAL NERVE BLOCK  07/19/2003   KNEE ARTHROSCOPY Right 07/18/2014   MASS EXCISION N/A 01/25/2021   Procedure: EXCISION SUBCUTANEOUS VS SUBFASCIAL MASS TORSO 3CM;  Surgeon: Alger Infield, MD;  Location: [facility];  Service: Plastics;  Laterality: N/A;   PILONIDAL CYST EXCISION N/A 09/13/2022   Procedure: CYST EXCISION PILONIDAL EXTENSIVE;  Surgeon: Emmalene Hare, MD;  Location: [facility];  Service: General;  Laterality: N/A;   SHOULDER ARTHROSCOPY WITH ROTATOR CUFF REPAIR AND OPEN BICEPS TENODESIS Left 07/26/2023   Procedure: LEFT SHOULDER ARTHROSCOPY WITH OPEN ROTATOR CUFF REPAIR AND OPEN BICEPS TENODESIS;  Surgeon: Adah Acron, MD;  Location: [facility];  Service: Orthopedics;  Laterality: Left;   Patient Active Problem List   Diagnosis Date Noted   Impingement syndrome of right shoulder 10/03/2023   Tendinitis of upper biceps tendon of left shoulder 06/12/2023   Complete tear of left rotator cuff 10/28/2022   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   Pilonidal cyst 09/13/2022   Aortic atherosclerosis (HCC) 06/07/2022   Hypercoagulable state due to paroxysmal atrial fibrillation (HCC) 03/23/2021   Type 2 diabetes mellitus with hyperglycemia, without long-term current use of insulin (HCC) 02/10/2021   Neck pain on left side 03/06/2019   Musculoskeletal chest pain 07/24/2018   Asthma, mild intermittent 05/02/2018   Allergic rhinitis caused by mold 05/02/2018   Tobacco use 05/02/2018   Bilateral carpal tunnel syndrome 01/10/2018   Stroke (HCC)    Schizophrenia (HCC)    Migraine    History of nuclear stress test    History of alcohol abuse    GERD (gastroesophageal reflux disease)    Dysrhythmia    Chronic lower back pain    Anxiety    Arthritis of knee, right 04/12/2017   Bipolar disorder (HCC) 04/03/2017   Lipoma of forehead 09/22/2016    Trigger ring finger of right hand 06/22/2016   Paroxysmal atrial fibrillation (HCC)    AF (atrial fibrillation) (HCC) 12/10/2015   Eczema 07/10/2015   History of CVA (cerebrovascular accident) 05/04/2015   Tear of medial meniscus of right knee 03/18/2015   Chronic pain of right knee 03/12/2015   Other and unspecified hyperlipidemia 11/25/2013   Nasal congestion 11/25/2013   Sinusitis, chronic 05/09/2013   Chronic low back pain 10/12/2012   Lumbar radiculopathy 10/12/2012   Hypertension associated with diabetes (HCC) 10/12/2012   Depression 10/12/2012   Insomnia 10/12/2012    PCP: Joaquin Mulberry MD   REFERRING PROVIDER: Tommy Frames MD   REFERRING DIAG: [phone] (ICD-10-CM) - Complete tear of left rotator cuff, unspecified whether traumatic  THERAPY DIAG:  Acute pain of left shoulder  Status post left rotator cuff repair  Rationale for Evaluation and Treatment: Rehabilitation  ONSET DATE: 07/26/23   SUBJECTIVE:        SUBJECTIVE STATEMENT:  Pain in R shoulder 8/10, this one needs an MRI and possible surgery (incorrect referral?)   Pain in LT UE is 6/10 and is a lot better, just needs more strength.  Pt reports the Lt shoulder is doing better with pain and mobility  MRI for his Rt shoulder is approved and scheduled for June 9   PERTINENT HISTORY: HTN CVA DM Atrial fibrillation  PAIN:  Are you having pain? Yes: NPRS scale: 6/10 L shoulder  Pain location: deep in the shoulder joint  Pain description: sore achy and sharp Aggravating factors: holding it up, using it  Relieving factors: hot shower, massager, meds tried not to take it too much   Are you having pain? Yes: NPRS scale: 8/10 R shoulder  Pain location: Back and Rt shoulder is keeping me up at night.  Pain description: sore achy and sharp Aggravating factors: holding it up, using it  Relieving factors: hot shower, massager, meds tried not to take it too much    PRECAUTIONS: Other: none   RED  FLAGS: None   WEIGHT BEARING RESTRICTIONS: No  FALLS:  Has patient fallen in last 6 months? 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   PLOF: Independent with household mobility without device, Independent with community mobility without device, Needs assistance with ADLs, and Leisure: has a CNA in [place]  PATIENT GOALS:Patient wants to get ready for the highway.  Get back to fishing    OBJECTIVE:  Note: Objective measures were complet ed at Evaluation unless otherwise noted. PATIENT SURVEYS:  Cindia Crease 89 11/09/23: Quick DASH 59%   SENSATION: L biceps numb and tinging to fingers at times.  He reports this in the L UE as well.   POSTURE: Leans head to the L, shoulders elevated, shoulders rounded   UPPER EXTREMITY ROM:   Passive ROM Lt  11/02/23 11/06/23 Lt 11/09/23 11/21/23  11/23/23 LT  Shoulder flexion 132 A 140 A AAROM 146 at wall  AROM 140 145  Shoulder extension       Shoulder abduction 80 A 130 A 120 deg   130  Shoulder adduction       Shoulder internal rotation   FR to upper lumbar  In abd, 65 deg IR     Shoulder external rotation   FR to back of neck  At 50 deg abd, AROM to 56 deg in ER   T2  Elbow flexion       Elbow extension        UPPER EXTREMITY MMT: NT due to post surgical. Appears to be 3-/5  MMT Right 10/12/23 Left 10/12/23 Lt.  11/09/23  Shoulder flexion 4+/5 3-/5 3+/5  Shoulder extension     Shoulder abduction 4/5 3-/5 3-/5  Shoulder adduction     Shoulder internal rotation  4/5 5/5  Shoulder external rotation  3+/5 3+/5  Middle trapezius     Lower trapezius     Elbow flexion   4+  Elbow extension   4+  Wrist flexion     Wrist extension     Wrist ulnar deviation     Wrist radial deviation     Wrist pronation      Wrist  supination     Grip strength (lbs) 70 83   (Blank rows = not tested)  SHOULDER SPECIAL TESTS: NT  JOINT MOBILITY TESTING:  Stiffness   PALPATION:  In TTP along Lt posterior and lateral shoulder                                                                                                                              TREATMENT DATE:  [facility] Adult PT Treatment:                                                DATE: 11/23/23  Therapeutic Activity UBE 2 mins FWD, 2 mins BWD Standing shoulder flexion x 10 AROM Shoulder row 2x15 BluTB Shoulder ext 2x15 BluTB Left shoudler IR Blue 2x 15 Left shoudler ER GTB 2x 15 Standing Left forward raise 1# 2 x 10  left  Standing lateral raise 1# 2 x 10 left  Standing OH press 1# 2 x 10 left  Side abduction 2# 10 x 2  Side ER 10 x 2 2#  Supine shoulder flexion 1 # x 10, 2# x 10       [facility] Adult PT Treatment:                                                DATE: 11/21/23 Manual Therapy: STM to the L teres minor and major, and lat dorsi Skilled palpation to identify Tps and taut muscle bands Trigger Point Dry Needling  Subsequent Treatment: Instructions provided previously at initial dry needling treatment.   Patient Verbal Consent Given: Yes Education Handout Provided: Previously Provided Muscles Treated: L teres minor and major, lat dorsi Electrical Stimulation Performed: Yes  Treatment Response/Outcome: Muscle twitch response   Therapeutic Activity UBE 2 mins FWD, 2 mins BWD Standing shoulder flexion x 10 AROM Shoulder row 2x15 BluTB Shoulder ext 2x15 BluTB Left shoudler IR Blue 2x 15 Left shoudler ER GTB 2x 15 Supine protraction 3x10 5# Therapeutic Exercise: Standing L shoulder ER stretch 3x 20" Standing L shoulder Lat Dorsi stretch 3x 20" Modalities: Estim mA, 6 pps, intensity as tolerated  [facility] Adult PT Treatment:                                                DATE: 11/15/23 Manual Therapy: STNM to the L teres minor and  major, and lat dorsi Skilled palpation to identify Tps and taut muscle bands Trigger Point Dry Needling  Subsequent Treatment: Instructions provided previously at initial dry needling treatment.   Patient Verbal Consent  Given: Yes Education Handout Provided: Previously Provided Muscles Treated: L teres minor and major, lat dorsi Electrical Stimulation Performed: No Treatment Response/Outcome: Muscle twitch response   Therapeutic Activity Standing shoulder flexion x 10 AROM Standing shoulder abduction x 10 AROM Supine left shoulder 3# long lever flexion x 20 Supine protraction 3x10 5# S/L Left shoulder ER 3# 3x10, limited ER ROM Standing lat pull FM 3x10 20# Standing chest pull FM 3x10 20# Therapeutic Exercise: Left shoudler IR Blue x 20 Left shoudler ER GTB x 20 Standing L shoulder ER stretch 3x 20" Standing L shoulder Lat Dorsi stretch 3x 20"  [facility] Adult PT Treatment:                                                DATE: 11/09/23 Therapeutic Activity: Wall slides in flexion abduction bilaterally for about 1 minute each Pulleys in abduction/scaption  MMT Grip strength testing in both UEs AROM L UE all planes sitting and standing   Self Care: Plan of care, home exercise program routine, visit limit with Medicaid and possible surgical procedure on the right shoulder  PATIENT EDUCATION: Education details: HEP review Person educated: Patient Education method: Programmer, multimedia, Demonstration, Verbal cues Education comprehension: verbalized understanding and needs further education  HOME EXERCISE PROGRAM: Access Code: [email or web address] URL: [email or web address] Date: 10/27/2023 Prepared by: Liborio Reeds  Exercises - Supine Shoulder Press AAROM in Abduction with Dowel  - 1-3 x daily - 7 x weekly - 2 sets - 10 reps - 5 hold - Supine Shoulder Flexion Extension AAROM with Dowel  - 1-3 x daily - 7 x weekly - 2 sets - 10 reps - 5 hold - Supine Shoulder External Rotation with  Dowel  - 1-3 x daily - 7 x weekly - 2 sets - 10 reps - 5 hold - Standing Shoulder Row with Anchored Resistance  - 1 x daily - 7 x weekly - 2 sets - 10 reps - 5 hold - Shoulder extension with resistance - Neutral  - 1 x daily - 7 x weekly - 2 sets - 10 reps - 5 hold - Seated Shoulder External Rotation PROM on Table  - 1 x daily - 7 x weekly - 2 sets - 10 reps - 30 hold - Seated Single Arm Shoulder Full Can AROM  - 1 x daily - 7 x weekly - 2 sets - 10 reps - 5 hold - Seated Single Arm Shoulder External Rotation with Self-Anchored Resistance  - 1 x daily - 7 x weekly - 2 sets - 10 reps - 5 hold - Shoulder Flexion Serratus Activation with Resistance  - 1 x daily - 7 x weekly - 2 sets - 10 reps - 30 hold - Sidelying Shoulder ER with Towel and Dumbbell  - 1 x daily - 7 x weekly - 3 sets - 10 reps - Sidelying Shoulder Abduction Palm Forward  - 1 x daily - 7 x weekly - 3 sets - 10 reps   ASSESSMENT:  CLINICAL IMPRESSION:  Pt reports his right shoulder MRI has been approved. Today we continued with left shoulder PT,  s/ pRTC repair  Jan 8. He is now 4 months out. Focused strengthening today and updated HEP.    PT was completed for STM to the teres' muscles and lat dorsi f/b TPDN with mA estim. Muscle contractions were elicited. Exercises for  muscle facilitation with flexibility and strengthening of the L shoulder were then completed. Strengthening was completed for RC and periscapular muscles. Assessment for L shoulder AROM flexion found this motion to be good for functional range. Pt tolerated PT today without adverse effects.  EVAL: Patient is a 63 y.o. male who was seen today for physical therapy evaluation and treatment for L shoulder surgery. He reports he has been working on moving his shoulder at home.  He is 6 weeks out from surgery.  He will transition to light strengthening but will ensure he is following MD protocol.   OBJECTIVE IMPAIRMENTS: cardiopulmonary status limiting activity, decreased  mobility, decreased ROM, decreased strength, hypomobility, increased fascial restrictions, impaired flexibility, impaired sensation, impaired UE functional use, improper body mechanics, postural dysfunction, obesity, and pain.   ACTIVITY LIMITATIONS: carrying, lifting, sitting, standing, sleeping, stairs, bed mobility, bathing, dressing, reach over head, hygiene/grooming, locomotion level, and caring for others  PARTICIPATION LIMITATIONS: meal prep, cleaning, interpersonal relationship, driving, shopping, community activity, yard work, and recreation  PERSONAL FACTORS: Education, Past/current experiences, Social background, and 3+ comorbidities: HTN, CVA , DM  are also affecting patient's functional outcome.    GOALS: Goals reviewed with patient? Yes  SHORT TERM GOALS: Target date: 10/09/2023  Pt will be I with HEP for shoulder A/AROM, strength  Baseline:given on eval  Goal status: MET  2.  Pt will be able to use L arm for dressing and bathing with min difficulty.  Baseline: needs A  Goal status: MET  3.  Pt will note pain at rest, no more than min for recreation, meals Baseline: moderate Goal status:ongoing   4.  Pt will be able to use pillows/ice/heat appropriately for pain mgmt at home.  Baseline: needs reinforcement  Goal status: MET   LONG TERM GOALS: Target date: 11/06/2023   Pt will be I with HEP for final HEP upon discharge Baseline: unknown  Goal status: ongoing   2.  Pt will be able to carry 1-3 light items in his L UE down by his side (groceries) without increased pain  Baseline:can do this now Goal status: MET  3.  Pt will be able to improve ability to sleep/rest with only min disturbance from sleep.  Baseline: severely limited , mostly due to Rt UE  Goal status: ongoing   4.  Pt will be able to report no sensory symptoms in LUE  Baseline: moderate N/T in L UE  Goal status: ongoing   5.  DAsh will improve to 75% or better  Baseline: 89% Goal status:  MET   PLAN: PT FREQUENCY: 2x/week  PT DURATION: 8 weeks  PLANNED INTERVENTIONS: 97164- PT Re-evaluation, 97110-Therapeutic exercises, 97530- Therapeutic activity, 97112- Neuromuscular re-education, 97535- Self Care, 16109- Manual therapy, Patient/Family education, Taping, Dry Needling, Joint mobilization, Cryotherapy, and Moist heat  PLAN FOR NEXT SESSION: Add to HEP. Cont.  AAROM , Light strength.     Gasper Karst, PTA 11/23/23 9:41 AM Phone: [phone] Fax: [phone]

## 2023-11-24 DIAGNOSIS — I639 Cerebral infarction, unspecified: Secondary | ICD-10-CM | POA: Diagnosis not present

## 2023-11-25 DIAGNOSIS — I639 Cerebral infarction, unspecified: Secondary | ICD-10-CM | POA: Diagnosis not present

## 2023-11-26 DIAGNOSIS — I639 Cerebral infarction, unspecified: Secondary | ICD-10-CM | POA: Diagnosis not present

## 2023-11-27 DIAGNOSIS — I639 Cerebral infarction, unspecified: Secondary | ICD-10-CM | POA: Diagnosis not present

## 2023-11-27 NOTE — Therapy (Signed)
 OUTPATIENT PHYSICAL THERAPY TREATMENT   Patient Name: Jason Stokes MRN: [id number] DOB:01-17-1961, 63 y.o., male Today's Date: 11/28/2023   END OF SESSION:  PT End of Session - 11/28/23 0905     Visit Number 17    Number of Visits 29    Date for PT Re-Evaluation 01/04/24    Authorization Type [employer]    Authorization - Visit Number 17    Authorization - Number of Visits 27    PT Start Time 0857    PT Stop Time 0930    PT Time Calculation (min) 33 min    Activity Tolerance Patient tolerated treatment well    Behavior During Therapy WFL for tasks assessed/performed                        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)    a.) s/p embolization 08/19/2020: 29 mm RIGHT internal iliac artery aneurysm   Anxiety    Aortic atherosclerosis (HCC)    Bilateral carpal tunnel syndrome 01/10/2018   Bipolar disorder (HCC)    Chorioretinal inflammation of both eyes    a.) on azothioprine   Chronic lower back pain    Coronary artery calcification seen on CT scan    a.) cCTA 04/21/2022: Ca score 20.6 (61st percentile for age/sex match control)   DDD (degenerative disc disease), cervical    Depression    Diastolic dysfunction    a.) TTE 01/13/2021: EF 60-65%, mod LVH, triv MR, G1DD   GERD (gastroesophageal reflux disease)    Hepatic steatosis    History of alcohol abuse    History of nuclear stress test    Myoview 10/16: EF 50%, diaphragmatic attenuation, no ischemia, low risk   Hypertension    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   a.) CT head 12/25/2012 --> RIGHT basal ganglia hypoattenuation related to remote lacunar infarct   Lipoma    Long term (current) use of anticoagulants    a.) apixaban    Long-term current use of immunomodulator    a.) on azothioprine for peripheral focal chorioretinal inflammation (both eyes)   Marijuana use    Migraine    Moderate persistent asthma with acute exacerbation  05/02/2018   PAF (paroxysmal atrial fibrillation) (HCC) 03/27/2015   a.) CHA2DS2VASc = 5 (HTN, CVA x2, vascular disease history, T2DM);  b.) s/p ablation 10/02/2015; c.) s/p ablation 12/10/2015; d.) s/p DCCV (200 J x 1) 12/11/2015; e.) s/p ablation 04/28/2022; f.) rate/rhythm maintained on oral diltiazem  + carvedilol ; chronically anticoagulated with apixaban    Pilonidal cyst    Schizophrenia (HCC)    Sciatica neuralgia    T2DM (type 2 diabetes mellitus) (HCC)    Thoracic aortic ectasia (HCC) 01/13/2021   a.) TTE 01/13/2021: Ao root 38 mm, asc Ao 39 mm   Past Surgical History:  Procedure Laterality Date   ATRIAL FIBRILLATION ABLATION  09/22/2015   ATRIAL FIBRILLATION ABLATION N/A 04/28/2022   Procedure: ATRIAL FIBRILLATION ABLATION;  Surgeon: Lei Pump, MD;  Location: [facility];  Service: Cardiovascular;  Laterality: N/A;   CATARACT EXTRACTION Bilateral    CYST EXCISION  1996-97   surgery back of head    ELECTROPHYSIOLOGIC STUDY N/A 09/22/2015   Procedure: Atrial Fibrillation Ablation;  Surgeon: Will Cortland Ding, MD;  Location: [facility];  Service: Cardiovascular;  Laterality: N/A;   ELECTROPHYSIOLOGIC STUDY N/A 12/10/2015   Procedure: Atrial Fibrillation Ablation;  Surgeon: Will Cortland Ding, MD;  Location: [facility];  Service: Cardiovascular;  Laterality: N/A;   ELECTROPHYSIOLOGIC STUDY N/A 12/11/2015   Procedure: Cardioversion;  Surgeon: Will Cortland Ding, MD;  Location: [facility];  Service: Cardiovascular;  Laterality: N/A;   EMBOLIZATION (CATH LAB) Right 08/19/2020   Procedure: EMBOLIZATION;  Surgeon: Carlene Che, MD;  Location: [facility];  Service: Cardiovascular;  Laterality: Right;  hypogastric   EXCISION MASS HEAD N/A 01/06/2017   Procedure: EXCISION MASS FOREHEAD;  Surgeon: Alger Infield, MD;  Location: [facility];  Service: Plastics;  Laterality: N/A;   EXCISION MASS UPPER EXTREMETIES Right  08/08/2022   Procedure: EXCISION MASS RIGHT FOREARM;  Surgeon: Brunilda Capra, MD;  Location: [facility];  Service: Orthopedics;  Laterality: Right;  45 MIN   GANGLION CYST EXCISION Left    INTERCOSTAL NERVE BLOCK  07/19/2003   KNEE ARTHROSCOPY Right 07/18/2014   MASS EXCISION N/A 01/25/2021   Procedure: EXCISION SUBCUTANEOUS VS SUBFASCIAL MASS TORSO 3CM;  Surgeon: Alger Infield, MD;  Location: [facility];  Service: Plastics;  Laterality: N/A;   PILONIDAL CYST EXCISION N/A 09/13/2022   Procedure: CYST EXCISION PILONIDAL EXTENSIVE;  Surgeon: Emmalene Hare, MD;  Location: [facility];  Service: General;  Laterality: N/A;   SHOULDER ARTHROSCOPY WITH ROTATOR CUFF REPAIR AND OPEN BICEPS TENODESIS Left 07/26/2023   Procedure: LEFT SHOULDER ARTHROSCOPY WITH OPEN ROTATOR CUFF REPAIR AND OPEN BICEPS TENODESIS;  Surgeon: Adah Acron, MD;  Location: [facility];  Service: Orthopedics;  Laterality: Left;   Patient Active Problem List   Diagnosis Date Noted   Impingement syndrome of right shoulder 10/03/2023   Tendinitis of upper biceps tendon of left shoulder 06/12/2023   Complete tear of left rotator cuff 10/28/2022   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   Pilonidal cyst 09/13/2022   Aortic atherosclerosis (HCC) 06/07/2022   Hypercoagulable state due to paroxysmal atrial fibrillation (HCC) 03/23/2021   Type 2 diabetes mellitus with hyperglycemia, without long-term current use of insulin (HCC) 02/10/2021   Neck pain on left side 03/06/2019   Musculoskeletal chest pain 07/24/2018   Asthma, mild intermittent 05/02/2018   Allergic rhinitis caused by mold 05/02/2018   Tobacco use 05/02/2018   Bilateral carpal tunnel syndrome 01/10/2018   Stroke (HCC)    Schizophrenia (HCC)    Migraine    History of nuclear stress test    History of alcohol abuse    GERD (gastroesophageal reflux disease)    Dysrhythmia    Chronic lower back pain    Anxiety     Arthritis of knee, right 04/12/2017   Bipolar disorder (HCC) 04/03/2017   Lipoma of forehead 09/22/2016   Trigger ring finger of right hand 06/22/2016   Paroxysmal atrial fibrillation (HCC)    AF (atrial fibrillation) (HCC) 12/10/2015   Eczema 07/10/2015   History of CVA (cerebrovascular accident) 05/04/2015   Tear of medial meniscus of right knee 03/18/2015   Chronic pain of right knee 03/12/2015   Other and unspecified hyperlipidemia 11/25/2013   Nasal congestion 11/25/2013   Sinusitis, chronic 05/09/2013   Chronic low back pain 10/12/2012   Lumbar radiculopathy 10/12/2012   Hypertension associated with diabetes (HCC) 10/12/2012   Depression 10/12/2012   Insomnia 10/12/2012    PCP: Joaquin Mulberry MD   REFERRING PROVIDER: Tommy Frames MD   REFERRING DIAG: M75.122 (ICD-10-CM) - Complete tear of left rotator cuff, unspecified whether traumatic  THERAPY DIAG:  Acute  pain of left shoulder  Status post left rotator cuff repair  Muscle weakness (generalized)  Stiffness of left shoulder, not elsewhere classified  Rationale for Evaluation and Treatment: Rehabilitation  ONSET DATE: 07/26/23   SUBJECTIVE:        SUBJECTIVE STATEMENT:  Pain in R shoulder 8.5/10, MRI is scheduled  for 12/25/23  Pain in LT UE is 6/10.  PERTINENT HISTORY: HTN CVA DM Atrial fibrillation  PAIN:  Are you having pain? Yes: NPRS scale: 6/10 L shoulder  Pain location: deep in the shoulder joint  Pain description: sore achy and sharp Aggravating factors: holding it up, using it  Relieving factors: hot shower, massager, meds tried not to take it too much   Are you having pain? Yes: NPRS scale: 8/10 R shoulder  Pain location: Back and Rt shoulder is keeping me up at night.  Pain description: sore achy and sharp Aggravating factors: holding it up, using it  Relieving factors: hot shower, massager, meds tried not to take it too much    PRECAUTIONS: Other: none   RED FLAGS: None   WEIGHT  BEARING RESTRICTIONS: No  FALLS:  Has patient fallen in last 6 months? 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   PLOF: Independent with household mobility without device, Independent with community mobility without device, Needs assistance with ADLs, and Leisure: has a CNA in [place]  PATIENT GOALS:Patient wants to get ready for the highway.  Get back to fishing    OBJECTIVE:  Note: Objective measures were complet ed at Evaluation unless otherwise noted. PATIENT SURVEYS:  Cindia Crease 89 11/09/23: Quick DASH 59%   SENSATION: L biceps numb and tinging to fingers at times.  He reports this in the L UE as well.   POSTURE: Leans head to the L, shoulders elevated, shoulders rounded   UPPER EXTREMITY ROM:   Passive ROM Lt  11/02/23 11/06/23 Lt 11/09/23 11/21/23  11/23/23 LT  Shoulder flexion 132 A 140 A AAROM 146 at wall  AROM 140 145  Shoulder extension       Shoulder abduction 80 A 130 A 120 deg   130  Shoulder adduction       Shoulder internal rotation   FR to upper lumbar  In abd, 65 deg IR     Shoulder external rotation   FR to back of neck  At 50 deg abd, AROM to 56 deg in ER   T2  Elbow flexion       Elbow extension        UPPER EXTREMITY MMT: NT due to post surgical. Appears to be 3-/5  MMT Right 10/12/23 Left 10/12/23 Lt.  11/09/23  Shoulder flexion 4+/5 3-/5 3+/5  Shoulder extension     Shoulder abduction 4/5 3-/5 3-/5  Shoulder adduction     Shoulder internal rotation  4/5 5/5  Shoulder external rotation  3+/5 3+/5  Middle trapezius     Lower trapezius     Elbow flexion   4+  Elbow extension   4+  Wrist flexion     Wrist extension     Wrist ulnar deviation     Wrist radial deviation     Wrist pronation     Wrist supination     Grip  strength (lbs) 70 83   (  Blank rows = not tested)  SHOULDER SPECIAL TESTS: NT  JOINT MOBILITY TESTING:  Stiffness   PALPATION:  In TTP along Lt posterior and lateral shoulder                                                                                                                              TREATMENT DATE: [facility] Adult PT Treatment:                                                DATE: 11/28/23 Manual Therapy: STM to the L teres minor and major, and lat dorsi Skilled palpation to identify Tps and taut muscle bands Trigger Point Dry Needling  Subsequent Treatment: Instructions provided previously at initial dry needling treatment.   Patient Verbal Consent Given: Yes Education Handout Provided: Previously Provided Muscles Treated: L teres minor and major, lat dorsi Electrical Stimulation Performed: Yes  Treatment Response/Outcome: Muscle twitch response   Therapeutic Activity UBE 2 mins FWD, 2 mins BWD Standing shoulder flexion 2 x 10 AROM Wall slide scaption x10 Standing shoulder scaption 2 x 10 AROM Standing shoulder flex and scaption x10 each Shoulder row 2x15 BluTB Shoulder ext 2x15 BluTB Left shoudler IR Blue 2x 15 Left shoudler ER GTB 2x 15 Modalities: Estim mA, 6 pps, intensity as tolerated 10 mins   [facility] Adult PT Treatment:                                                DATE: 11/23/23 Therapeutic Activity UBE 2 mins FWD, 2 mins BWD Standing shoulder flexion x 10 AROM Shoulder row 2x15 BluTB Shoulder ext 2x15 BluTB Left shoudler IR Blue 2x 15 Left shoudler ER GTB 2x 15 Standing Left forward raise 1# 2 x 10  left  Standing lateral raise 1# 2 x 10 left  Standing OH press 1# 2 x 10 left  Side abduction 2# 10 x 2  Side ER 10 x 2 2#  Supine shoulder flexion 1 # x 10, 2# x 10    PATIENT EDUCATION: Education details: HEP review Person educated: Patient Education method: Programmer, multimedia, Facilities manager, Verbal cues Education comprehension: verbalized  understanding and needs further education  HOME EXERCISE PROGRAM: Access Code: [email or web address] URL: [email or web address] Date: 10/27/2023 Prepared by: Liborio Reeds  Exercises - Supine Shoulder Press AAROM in Abduction with Dowel  - 1-3 x daily - 7 x weekly - 2 sets - 10 reps - 5 hold - Supine Shoulder Flexion Extension AAROM with Dowel  - 1-3 x daily - 7 x weekly - 2 sets - 10 reps - 5 hold - Supine Shoulder External Rotation with Dowel  - 1-3 x daily - 7 x weekly - 2  sets - 10 reps - 5 hold - Standing Shoulder Row with Anchored Resistance  - 1 x daily - 7 x weekly - 2 sets - 10 reps - 5 hold - Shoulder extension with resistance - Neutral  - 1 x daily - 7 x weekly - 2 sets - 10 reps - 5 hold - Seated Shoulder External Rotation PROM on Table  - 1 x daily - 7 x weekly - 2 sets - 10 reps - 30 hold - Seated Single Arm Shoulder Full Can AROM  - 1 x daily - 7 x weekly - 2 sets - 10 reps - 5 hold - Seated Single Arm Shoulder External Rotation with Self-Anchored Resistance  - 1 x daily - 7 x weekly - 2 sets - 10 reps - 5 hold - Shoulder Flexion Serratus Activation with Resistance  - 1 x daily - 7 x weekly - 2 sets - 10 reps - 30 hold - Sidelying Shoulder ER with Towel and Dumbbell  - 1 x daily - 7 x weekly - 3 sets - 10 reps - Sidelying Shoulder Abduction Palm Forward  - 1 x daily - 7 x weekly - 3 sets - 10 reps   ASSESSMENT:  CLINICAL IMPRESSION:  Pt arrived late for his PT appt today. PT was completed for L shoulder strengthening and ROM f/b STM to the teres' muscles and lat dorsi f/b TPDN with mA estim. Pt tolerated PT today without adverse effects. Pt is making good progress re: L shoulder pain, ROM, strength and functional use. Pt will continue to benefit from skilled PT to address impairments for improved L shoulder function.  EVAL: Patient is a 63 y.o. male who was seen today for physical therapy evaluation and treatment for L shoulder surgery. He reports he has been working on  moving his shoulder at home.  He is 6 weeks out from surgery.  He will transition to light strengthening but will ensure he is following MD protocol.   OBJECTIVE IMPAIRMENTS: cardiopulmonary status limiting activity, decreased mobility, decreased ROM, decreased strength, hypomobility, increased fascial restrictions, impaired flexibility, impaired sensation, impaired UE functional use, improper body mechanics, postural dysfunction, obesity, and pain.   ACTIVITY LIMITATIONS: carrying, lifting, sitting, standing, sleeping, stairs, bed mobility, bathing, dressing, reach over head, hygiene/grooming, locomotion level, and caring for others  PARTICIPATION LIMITATIONS: meal prep, cleaning, interpersonal relationship, driving, shopping, community activity, yard work, and recreation  PERSONAL FACTORS: Education, Past/current experiences, Social background, and 3+ comorbidities: HTN, CVA , DM  are also affecting patient's functional outcome.    GOALS: Goals reviewed with patient? Yes  SHORT TERM GOALS: Target date: 10/09/2023  Pt will be I with HEP for shoulder A/AROM, strength  Baseline:given on eval  Goal status: MET  2.  Pt will be able to use L arm for dressing and bathing with min difficulty.  Baseline: needs A  Goal status: MET  3.  Pt will note pain at rest, no more than min for recreation, meals Baseline: moderate Goal status:ongoing   4.  Pt will be able to use pillows/ice/heat appropriately for pain mgmt at home.  Baseline: needs reinforcement  Goal status: MET   LONG TERM GOALS: Target date: 11/06/2023   Pt will be I with HEP for final HEP upon discharge Baseline: unknown  Goal status: ongoing   2.  Pt will be able to carry 1-3 light items in his L UE down by his side (groceries) without increased pain  Baseline:can do this now Goal status:  MET  3.  Pt will be able to improve ability to sleep/rest with only min disturbance from sleep.  Baseline: severely limited , mostly due  to Rt UE  Goal status: ongoing   4.  Pt will be able to report no sensory symptoms in LUE  Baseline: moderate N/T in L UE  Goal status: ongoing   5.  DAsh will improve to 75% or better  Baseline: 89% Goal status: MET   PLAN: PT FREQUENCY: 2x/week  PT DURATION: 8 weeks  PLANNED INTERVENTIONS: 97164- PT Re-evaluation, 97110-Therapeutic exercises, 97530- Therapeutic activity, 97112- Neuromuscular re-education, 97535- Self Care, 16109- Manual therapy, Patient/Family education, Taping, Dry Needling, Joint mobilization, Cryotherapy, and Moist heat  PLAN FOR NEXT SESSION: Add to HEP. Cont.  AAROM , Light strength.     Maris Bena MS, PT 11/28/23 9:30 AM

## 2023-11-28 ENCOUNTER — Ambulatory Visit

## 2023-11-28 DIAGNOSIS — M25512 Pain in left shoulder: Secondary | ICD-10-CM

## 2023-11-28 DIAGNOSIS — M25612 Stiffness of left shoulder, not elsewhere classified: Secondary | ICD-10-CM

## 2023-11-28 DIAGNOSIS — M6281 Muscle weakness (generalized): Secondary | ICD-10-CM

## 2023-11-28 DIAGNOSIS — Z9889 Other specified postprocedural states: Secondary | ICD-10-CM

## 2023-11-28 DIAGNOSIS — I639 Cerebral infarction, unspecified: Secondary | ICD-10-CM | POA: Diagnosis not present

## 2023-11-29 ENCOUNTER — Ambulatory Visit (INDEPENDENT_AMBULATORY_CARE_PROVIDER_SITE_OTHER)

## 2023-11-29 DIAGNOSIS — J309 Allergic rhinitis, unspecified: Secondary | ICD-10-CM | POA: Diagnosis not present

## 2023-11-29 DIAGNOSIS — I639 Cerebral infarction, unspecified: Secondary | ICD-10-CM | POA: Diagnosis not present

## 2023-11-30 ENCOUNTER — Ambulatory Visit: Admitting: Physical Therapy

## 2023-11-30 ENCOUNTER — Encounter: Payer: Self-pay | Admitting: Physical Therapy

## 2023-11-30 DIAGNOSIS — I639 Cerebral infarction, unspecified: Secondary | ICD-10-CM | POA: Diagnosis not present

## 2023-11-30 DIAGNOSIS — M25512 Pain in left shoulder: Secondary | ICD-10-CM

## 2023-11-30 DIAGNOSIS — Z9889 Other specified postprocedural states: Secondary | ICD-10-CM

## 2023-11-30 NOTE — Therapy (Signed)
 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)    a.) s/p embolization 08/19/2020: 29 mm RIGHT internal iliac artery aneurysm   Anxiety    Aortic atherosclerosis (HCC)    Bilateral carpal tunnel syndrome 01/10/2018   Bipolar disorder (HCC)    Chorioretinal inflammation of both eyes    a.) on azothioprine   Chronic lower back pain    Coronary artery calcification seen on CT scan    a.) cCTA 04/21/2022: Ca score 20.6 (61st percentile for age/sex match control)   DDD (degenerative disc disease), cervical    Depression    Diastolic dysfunction    a.) TTE 01/13/2021: EF 60-65%, mod LVH, triv MR, G1DD   GERD (gastroesophageal reflux disease)    Hepatic steatosis    History of alcohol abuse    History of nuclear stress test    Myoview 10/16: EF 50%, diaphragmatic attenuation, no ischemia, low risk   Hypertension    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   a.) CT head 12/25/2012 --> RIGHT basal ganglia hypoattenuation related to remote lacunar infarct   Lipoma    Long term (current) use of anticoagulants    a.) apixaban    Long-term current use of immunomodulator    a.) on azothioprine for peripheral focal chorioretinal inflammation (both eyes)   Marijuana use    Migraine    Moderate persistent asthma with acute exacerbation 05/02/2018   PAF (paroxysmal atrial fibrillation) (HCC) 03/27/2015   a.) CHA2DS2VASc = 5 (HTN, CVA x2,  vascular disease history, T2DM);  b.) s/p ablation 10/02/2015; c.) s/p ablation 12/10/2015; d.) s/p DCCV (200 J x 1) 12/11/2015; e.) s/p ablation 04/28/2022; f.) rate/rhythm maintained on oral diltiazem  + carvedilol ; chronically anticoagulated with apixaban    Pilonidal cyst    Schizophrenia (HCC)    Sciatica neuralgia    T2DM (type 2 diabetes mellitus) (HCC)    Thoracic aortic ectasia (HCC) 01/13/2021   a.) TTE 01/13/2021: Ao root 38 mm, asc Ao 39 mm   Past Surgical History:  Procedure Laterality Date   ATRIAL FIBRILLATION ABLATION  09/22/2015   ATRIAL FIBRILLATION ABLATION N/A 04/28/2022   Procedure: ATRIAL FIBRILLATION ABLATION;  Surgeon: Lei Pump, MD;  Location: [facility];  Service: Cardiovascular;  Laterality: N/A;   CATARACT EXTRACTION Bilateral    CYST EXCISION  1996-97   surgery back of head    ELECTROPHYSIOLOGIC STUDY N/A 09/22/2015   Procedure: Atrial Fibrillation Ablation;  Surgeon: Will Cortland Ding, MD;  Location: [facility];  Service: Cardiovascular;  Laterality: N/A;   ELECTROPHYSIOLOGIC STUDY N/A 12/10/2015   Procedure: Atrial Fibrillation Ablation;  Surgeon: Will Cortland Ding, MD;  Location: [facility];  Service: Cardiovascular;  Laterality: N/A;   ELECTROPHYSIOLOGIC STUDY N/A 12/11/2015   Procedure: Cardioversion;  Surgeon: Will Cortland Ding, MD;  Location: [facility];  Service: Cardiovascular;  Laterality: N/A;   EMBOLIZATION (CATH LAB) Right 08/19/2020   Procedure: EMBOLIZATION;  Surgeon: Carlene Che, MD;  Location: [facility];  Service: Cardiovascular;  Laterality: Right;  hypogastric   EXCISION MASS HEAD N/A 01/06/2017   Procedure: EXCISION MASS FOREHEAD;  Surgeon: Alger Infield, MD;  Location: [facility];  Service: Plastics;  Laterality: N/A;   EXCISION MASS UPPER EXTREMETIES Right 08/08/2022   Procedure: EXCISION MASS RIGHT FOREARM;  Surgeon: Brunilda Capra, MD;  Location: [facility];  Service: Orthopedics;  Laterality: Right;  45 MIN   GANGLION CYST EXCISION Left    INTERCOSTAL NERVE BLOCK  07/19/2003   KNEE ARTHROSCOPY Right 07/18/2014   MASS EXCISION N/A 01/25/2021   Procedure: EXCISION SUBCUTANEOUS VS SUBFASCIAL MASS TORSO 3CM;  Surgeon: Alger Infield, MD;  Location: [facility];  Service: Plastics;  Laterality: N/A;   PILONIDAL CYST EXCISION N/A 09/13/2022   Procedure: CYST EXCISION PILONIDAL EXTENSIVE;  Surgeon: Emmalene Hare, MD;  Location: [facility];  Service: General;  Laterality: N/A;   SHOULDER ARTHROSCOPY WITH ROTATOR CUFF REPAIR AND OPEN BICEPS TENODESIS Left 07/26/2023   Procedure: LEFT SHOULDER ARTHROSCOPY WITH OPEN ROTATOR CUFF REPAIR AND OPEN BICEPS TENODESIS;  Surgeon: Adah Acron, MD;  Location: [facility];  Service: Orthopedics;  Laterality: Left;   Patient Active Problem List   Diagnosis Date Noted   Impingement syndrome of right shoulder 10/03/2023   Tendinitis of upper biceps tendon of left shoulder 06/12/2023   Complete tear of left rotator cuff 10/28/2022   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   Pilonidal cyst 09/13/2022   Aortic atherosclerosis (HCC) 06/07/2022   Hypercoagulable state due to paroxysmal atrial fibrillation (HCC) 03/23/2021   Type 2 diabetes mellitus with hyperglycemia, without long-term current use of insulin (HCC) 02/10/2021   Neck pain on left side 03/06/2019   Musculoskeletal chest pain 07/24/2018   Asthma, mild intermittent 05/02/2018   Allergic rhinitis caused by mold 05/02/2018   Tobacco use 05/02/2018   Bilateral carpal tunnel syndrome 01/10/2018   Stroke (HCC)    Schizophrenia (HCC)    Migraine    History of nuclear stress test    History of alcohol abuse    GERD (gastroesophageal reflux disease)    Dysrhythmia    Chronic lower back pain    Anxiety    Arthritis of knee, right 04/12/2017   Bipolar disorder (HCC) 04/03/2017   Lipoma of forehead 09/22/2016    Trigger ring finger of right hand 06/22/2016   Paroxysmal atrial fibrillation (HCC)    AF (atrial fibrillation) (HCC) 12/10/2015   Eczema 07/10/2015   History of CVA (cerebrovascular accident) 05/04/2015   Tear of medial meniscus of right knee 03/18/2015   Chronic pain of right knee 03/12/2015   Other and unspecified hyperlipidemia 11/25/2013   Nasal congestion 11/25/2013   Sinusitis, chronic 05/09/2013   Chronic low back pain 10/12/2012   Lumbar radiculopathy 10/12/2012   Hypertension associated with diabetes (HCC) 10/12/2012   Depression 10/12/2012   Insomnia 10/12/2012    PCP: Joaquin Mulberry MD   REFERRING PROVIDER: Tommy Frames MD   REFERRING DIAG: M75.122 (ICD-10-CM) - Complete tear of left rotator cuff, unspecified whether traumatic  THERAPY DIAG:  Acute pain of left shoulder  Status post left rotator cuff repair  Rationale for Evaluation  and Treatment: Rehabilitation  ONSET DATE: 07/26/23   SUBJECTIVE:        SUBJECTIVE STATEMENT:  Pain in R shoulder 8.5/10, MRI is scheduled  for 12/25/23  Lt shoulder 5/10   PERTINENT HISTORY: HTN CVA DM Atrial fibrillation  PAIN:  Are you having pain? Yes: NPRS scale: 5/10 L shoulder  Pain location: deep in the shoulder joint  Pain description: sore achy and sharp Aggravating factors: holding it up, using it  Relieving factors: hot shower, massager, meds tried not to take it too much   Are you having pain? Yes: NPRS scale: 8/10 R shoulder  Pain location: Back and Rt shoulder is keeping me up at night.  Pain description: sore achy and sharp Aggravating factors: holding it up, using it  Relieving factors: hot shower, massager, meds tried not to take it too much    PRECAUTIONS: Other: none   RED FLAGS: None   WEIGHT BEARING RESTRICTIONS: No  FALLS:  Has patient fallen in last 6 months? 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   PLOF: Independent with household mobility without device, Independent with community mobility without device, Needs assistance with ADLs, and Leisure: has a CNA in [place]  PATIENT GOALS:Patient wants to get ready for the highway.  Get back to fishing    OBJECTIVE:  Note: Objective measures were complet ed at Evaluation unless otherwise noted. PATIENT SURVEYS:  Cindia Crease 89 11/09/23: Quick DASH 59%   SENSATION: L biceps numb and tinging to fingers at times.  He reports this in the L UE as well.   POSTURE: Leans head to the L, shoulders elevated, shoulders rounded   UPPER EXTREMITY ROM:   Passive ROM Lt  11/02/23 11/06/23 Lt 11/09/23 11/21/23  11/23/23 LT  Shoulder flexion 132 A 140 A AAROM 146 at wall  AROM 140 145  Shoulder extension       Shoulder abduction 80 A 130 A 120 deg   130  Shoulder adduction       Shoulder internal rotation   FR to upper lumbar  In abd, 65 deg IR     Shoulder external rotation   FR to back of neck  At 50 deg abd, AROM to 56 deg in ER   T2  Elbow flexion       Elbow extension        UPPER EXTREMITY MMT: NT due to post surgical. Appears to be 3-/5  MMT Right 10/12/23 Left 10/12/23 Lt.  11/09/23  Shoulder flexion 4+/5 3-/5 3+/5  Shoulder extension     Shoulder abduction 4/5 3-/5 3-/5  Shoulder adduction     Shoulder internal rotation  4/5 5/5  Shoulder external rotation  3+/5 3+/5  Middle trapezius     Lower trapezius     Elbow flexion   4+  Elbow extension   4+  Wrist flexion     Wrist extension     Wrist ulnar deviation     Wrist radial deviation     Wrist pronation     Wrist supination     Grip strength (lbs) 70 83   (Blank rows = not tested)  SHOULDER SPECIAL TESTS: NT  JOINT MOBILITY TESTING:  Stiffness   PALPATION:  In TTP along Lt posterior and lateral shoulder  TREATMENT DATE: [facility] Adult PT Treatment:                                                DATE: 11/30/23  Therapeutic Activity UBE 2 mins FWD, 2 mins BWD Shoulder row 2x15 BluTB Shoulder ext 2x15 BluTB Left shoudler IR Blue 2x 15 Left shoudler ER BTB 2x 15 Standing Left forward raise 2# 2 x 10  left  Standing lateral raise 2# 2 x 10 left  Standing OH press 3# 2 x 10 left  Ball on wall circles 30 sec each direction  OH wall wash 60 sec  Abduction wall circles 30 sec x 2     [facility] Adult PT Treatment:                                                DATE: 11/28/23 Manual Therapy: STM to the L teres minor and major, and lat dorsi Skilled palpation to identify Tps and taut muscle bands Trigger Point Dry Needling  Subsequent Treatment: Instructions provided previously at initial dry needling treatment.   Patient Verbal Consent Given: Yes Education Handout Provided: Previously Provided Muscles Treated: L teres minor and major, lat dorsi Electrical Stimulation Performed: Yes  Treatment Response/Outcome: Muscle twitch response   Therapeutic Activity UBE 2 mins FWD, 2 mins BWD Standing shoulder flexion 2 x 10 AROM Wall slide scaption x10 Standing shoulder scaption 2 x 10 AROM Standing shoulder flex and scaption x10 each Shoulder row 2x15 BluTB Shoulder ext 2x15 BluTB Left shoudler IR Blue 2x 15 Left shoudler ER GTB 2x 15 Modalities: Estim mA, 6 pps, intensity as tolerated 10 mins   [facility] Adult PT Treatment:                                                DATE: 11/23/23 Therapeutic Activity UBE 2 mins FWD, 2 mins BWD Standing shoulder flexion x 10 AROM Shoulder row 2x15 BluTB Shoulder ext 2x15 BluTB Left shoudler IR Blue 2x 15 Left shoudler ER GTB 2x 15 Standing Left forward raise 1# 2 x 10  left  Standing lateral raise 1# 2 x 10 left  Standing OH press 1# 2 x 10 left  Side  abduction 2# 10 x 2  Side ER 10 x 2 2#  Supine shoulder flexion 1 # x 10, 2# x 10    PATIENT EDUCATION: Education details: HEP review Person educated: Patient Education method: Programmer, multimedia, Facilities manager, Verbal cues Education comprehension: verbalized understanding and needs further education  HOME EXERCISE PROGRAM: Access Code: [email or web address] URL: [email or web address] Date: 10/27/2023 Prepared by: Liborio Reeds  Exercises - Supine Shoulder Press AAROM in Abduction with Dowel  - 1-3 x daily - 7 x weekly - 2 sets - 10 reps - 5 hold - Supine Shoulder Flexion Extension AAROM with Dowel  - 1-3 x daily - 7 x weekly - 2 sets - 10 reps - 5 hold - Supine Shoulder External Rotation with Dowel  - 1-3 x daily - 7 x weekly - 2 sets - 10 reps - 5 hold - Standing Shoulder Row with Anchored Resistance  -  1 x daily - 7 x weekly - 2 sets - 10 reps - 5 hold - Shoulder extension with resistance - Neutral  - 1 x daily - 7 x weekly - 2 sets - 10 reps - 5 hold - Seated Shoulder External Rotation PROM on Table  - 1 x daily - 7 x weekly - 2 sets - 10 reps - 30 hold - Seated Single Arm Shoulder Full Can AROM  - 1 x daily - 7 x weekly - 2 sets - 10 reps - 5 hold - Seated Single Arm Shoulder External Rotation with Self-Anchored Resistance  - 1 x daily - 7 x weekly - 2 sets - 10 reps - 5 hold - Shoulder Flexion Serratus Activation with Resistance  - 1 x daily - 7 x weekly - 2 sets - 10 reps - 30 hold - Sidelying Shoulder ER with Towel and Dumbbell  - 1 x daily - 7 x weekly - 3 sets - 10 reps - Sidelying Shoulder Abduction Palm Forward  - 1 x daily - 7 x weekly - 3 sets - 10 reps   ASSESSMENT:  CLINICAL IMPRESSION:  Pt arrived late for his PT appt today. PT was completed for L shoulder endurance performing chest height  and over head tasks. Pt reported muscle burning and requested these exercises be added to his HEP. Pt tolerated PT today without adverse effects. Pt will continue to benefit from skilled  PT to address impairments for improved L shoulder function.  EVAL: Patient is a 63 y.o. male who was seen today for physical therapy evaluation and treatment for L shoulder surgery. He reports he has been working on moving his shoulder at home.  He is 6 weeks out from surgery.  He will transition to light strengthening but will ensure he is following MD protocol.   OBJECTIVE IMPAIRMENTS: cardiopulmonary status limiting activity, decreased mobility, decreased ROM, decreased strength, hypomobility, increased fascial restrictions, impaired flexibility, impaired sensation, impaired UE functional use, improper body mechanics, postural dysfunction, obesity, and pain.   ACTIVITY LIMITATIONS: carrying, lifting, sitting, standing, sleeping, stairs, bed mobility, bathing, dressing, reach over head, hygiene/grooming, locomotion level, and caring for others  PARTICIPATION LIMITATIONS: meal prep, cleaning, interpersonal relationship, driving, shopping, community activity, yard work, and recreation  PERSONAL FACTORS: Education, Past/current experiences, Social background, and 3+ comorbidities: HTN, CVA , DM  are also affecting patient's functional outcome.    GOALS: Goals reviewed with patient? Yes  SHORT TERM GOALS: Target date: 10/09/2023  Pt will be I with HEP for shoulder A/AROM, strength  Baseline:given on eval  Goal status: MET  2.  Pt will be able to use L arm for dressing and bathing with min difficulty.  Baseline: needs A  Goal status: MET  3.  Pt will note pain at rest, no more than min for recreation, meals Baseline: moderate Goal status:ongoing   4.  Pt will be able to use pillows/ice/heat appropriately for pain mgmt at home.  Baseline: needs reinforcement  Goal status: MET   LONG TERM GOALS: Target date: 01/04/2024   Pt will be I with HEP for final HEP upon discharge Baseline: unknown  Goal status: ongoing   2.  Pt will be able to carry 1-3 light items in his L UE down by his  side (groceries) without increased pain  Baseline:can do this now Goal status: MET  3.  Pt will be able to improve ability to sleep/rest with only min disturbance from sleep.  Baseline: severely limited ,  mostly due to Rt UE  Goal status: ongoing   4.  Pt will be able to report no sensory symptoms in LUE  Baseline: moderate N/T in L UE  Goal status: ongoing   5.  DAsh will improve to 75% or better  Baseline: 89% Goal status: MET   PLAN: PT FREQUENCY: 2x/week  PT DURATION: 8 weeks  PLANNED INTERVENTIONS: 97164- PT Re-evaluation, 97110-Therapeutic exercises, 97530- Therapeutic activity, 97112- Neuromuscular re-education, 97535- Self Care, 16109- Manual therapy, Patient/Family education, Taping, Dry Needling, Joint mobilization, Cryotherapy, and Moist heat  PLAN FOR NEXT SESSION: strength, endurance, finalize HEP over remainiang visits.    Gasper Karst, PTA 11/30/23 10:37 AM Phone: [phone] Fax: [phone]

## 2023-12-01 ENCOUNTER — Encounter: Payer: Self-pay | Admitting: Radiology

## 2023-12-01 NOTE — Therapy (Signed)
 OUTPATIENT PHYSICAL THERAPY TREATMENT   Patient Name: Jason Stokes MRN: [id number] DOB:10/14/1960, 63 y.o., male Today's Date: 12/04/2023   END OF SESSION:  PT End of Session - 12/04/23 0943     Visit Number 19    Number of Visits 29    Date for PT Re-Evaluation 01/04/24    Authorization Type [employer]    Authorization - Visit Number 19    Authorization - Number of Visits 27    PT Start Time [phone]    PT Stop Time 1015    PT Time Calculation (min) 33 min    Activity Tolerance Patient tolerated treatment well;Patient limited by pain    Behavior During Therapy [facility] for tasks assessed/performed                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)    a.) s/p embolization 08/19/2020: 29 mm RIGHT internal iliac artery aneurysm   Anxiety    Aortic atherosclerosis (HCC)    Bilateral carpal tunnel syndrome 01/10/2018   Bipolar disorder (HCC)    Chorioretinal inflammation of both eyes    a.) on azothioprine   Chronic lower back pain    Coronary artery calcification seen on CT scan    a.) cCTA 04/21/2022: Ca score 20.6 (61st percentile for age/sex match control)   DDD (degenerative disc disease), cervical    Depression    Diastolic dysfunction    a.) TTE 01/13/2021: EF 60-65%, mod LVH, triv MR, G1DD   GERD (gastroesophageal reflux disease)    Hepatic steatosis    History of alcohol abuse    History of nuclear stress test    Myoview 10/16: EF 50%, diaphragmatic attenuation, no ischemia, low risk   Hypertension    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   a.) CT head 12/25/2012 --> RIGHT basal ganglia hypoattenuation related to remote lacunar infarct   Lipoma    Long term (current) use of anticoagulants    a.) apixaban    Long-term current use of immunomodulator    a.) on azothioprine for peripheral focal chorioretinal inflammation (both eyes)   Marijuana use    Migraine    Moderate persistent asthma with acute  exacerbation 05/02/2018   PAF (paroxysmal atrial fibrillation) (HCC) 03/27/2015   a.) CHA2DS2VASc = 5 (HTN, CVA x2, vascular disease history, T2DM);  b.) s/p ablation 10/02/2015; c.) s/p ablation 12/10/2015; d.) s/p DCCV (200 J x 1) 12/11/2015; e.) s/p ablation 04/28/2022; f.) rate/rhythm maintained on oral diltiazem  + carvedilol ; chronically anticoagulated with apixaban    Pilonidal cyst    Schizophrenia (HCC)    Sciatica neuralgia    T2DM (type 2 diabetes mellitus) (HCC)    Thoracic aortic ectasia (HCC) 01/13/2021   a.) TTE 01/13/2021: Ao root 38 mm, asc Ao 39 mm   Past Surgical History:  Procedure Laterality Date   ATRIAL FIBRILLATION ABLATION  09/22/2015   ATRIAL FIBRILLATION ABLATION N/A 04/28/2022   Procedure: ATRIAL FIBRILLATION ABLATION;  Surgeon: Lei Pump, MD;  Location: [facility];  Service: Cardiovascular;  Laterality: N/A;   CATARACT EXTRACTION Bilateral    CYST EXCISION  1996-97   surgery back of head    ELECTROPHYSIOLOGIC STUDY N/A 09/22/2015   Procedure: Atrial Fibrillation Ablation;  Surgeon: Will Cortland Ding, MD;  Location: [facility];  Service: Cardiovascular;  Laterality: N/A;   ELECTROPHYSIOLOGIC STUDY N/A 12/10/2015   Procedure: Atrial Fibrillation Ablation;  Surgeon: Will [employer],  MD;  Location: [facility];  Service: Cardiovascular;  Laterality: N/A;   ELECTROPHYSIOLOGIC STUDY N/A 12/11/2015   Procedure: Cardioversion;  Surgeon: Will Cortland Ding, MD;  Location: [facility];  Service: Cardiovascular;  Laterality: N/A;   EMBOLIZATION (CATH LAB) Right 08/19/2020   Procedure: EMBOLIZATION;  Surgeon: Carlene Che, MD;  Location: [facility];  Service: Cardiovascular;  Laterality: Right;  hypogastric   EXCISION MASS HEAD N/A 01/06/2017   Procedure: EXCISION MASS FOREHEAD;  Surgeon: Alger Infield, MD;  Location: [facility];  Service: Plastics;  Laterality: N/A;   EXCISION MASS UPPER  EXTREMETIES Right 08/08/2022   Procedure: EXCISION MASS RIGHT FOREARM;  Surgeon: Brunilda Capra, MD;  Location: [facility];  Service: Orthopedics;  Laterality: Right;  45 MIN   GANGLION CYST EXCISION Left    INTERCOSTAL NERVE BLOCK  07/19/2003   KNEE ARTHROSCOPY Right 07/18/2014   MASS EXCISION N/A 01/25/2021   Procedure: EXCISION SUBCUTANEOUS VS SUBFASCIAL MASS TORSO 3CM;  Surgeon: Alger Infield, MD;  Location: [facility];  Service: Plastics;  Laterality: N/A;   PILONIDAL CYST EXCISION N/A 09/13/2022   Procedure: CYST EXCISION PILONIDAL EXTENSIVE;  Surgeon: Emmalene Hare, MD;  Location: [facility];  Service: General;  Laterality: N/A;   SHOULDER ARTHROSCOPY WITH ROTATOR CUFF REPAIR AND OPEN BICEPS TENODESIS Left 07/26/2023   Procedure: LEFT SHOULDER ARTHROSCOPY WITH OPEN ROTATOR CUFF REPAIR AND OPEN BICEPS TENODESIS;  Surgeon: Adah Acron, MD;  Location: [facility];  Service: Orthopedics;  Laterality: Left;   Patient Active Problem List   Diagnosis Date Noted   Impingement syndrome of right shoulder 10/03/2023   Tendinitis of upper biceps tendon of left shoulder 06/12/2023   Complete tear of left rotator cuff 10/28/2022   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   Pilonidal cyst 09/13/2022   Aortic atherosclerosis (HCC) 06/07/2022   Hypercoagulable state due to paroxysmal atrial fibrillation (HCC) 03/23/2021   Type 2 diabetes mellitus with hyperglycemia, without long-term current use of insulin (HCC) 02/10/2021   Neck pain on left side 03/06/2019   Musculoskeletal chest pain 07/24/2018   Asthma, mild intermittent 05/02/2018   Allergic rhinitis caused by mold 05/02/2018   Tobacco use 05/02/2018   Bilateral carpal tunnel syndrome 01/10/2018   Stroke (HCC)    Schizophrenia (HCC)    Migraine    History of nuclear stress test    History of alcohol abuse    GERD (gastroesophageal reflux disease)    Dysrhythmia    Chronic lower back pain     Anxiety    Arthritis of knee, right 04/12/2017   Bipolar disorder (HCC) 04/03/2017   Lipoma of forehead 09/22/2016   Trigger ring finger of right hand 06/22/2016   Paroxysmal atrial fibrillation (HCC)    AF (atrial fibrillation) (HCC) 12/10/2015   Eczema 07/10/2015   History of CVA (cerebrovascular accident) 05/04/2015   Tear of medial meniscus of right knee 03/18/2015   Chronic pain of right knee 03/12/2015   Other and unspecified hyperlipidemia 11/25/2013   Nasal congestion 11/25/2013   Sinusitis, chronic 05/09/2013   Chronic low back pain 10/12/2012   Lumbar radiculopathy 10/12/2012   Hypertension associated with diabetes (HCC) 10/12/2012   Depression 10/12/2012   Insomnia 10/12/2012    PCP: Joaquin Mulberry MD   REFERRING PROVIDER: Tommy Frames MD   REFERRING DIAG: [phone] (ICD-10-CM) - Complete tear of left rotator cuff, unspecified whether traumatic  THERAPY DIAG:  No diagnosis found.  Rationale  for Evaluation and Treatment: Rehabilitation  ONSET DATE: 07/26/23   SUBJECTIVE:        SUBJECTIVE STATEMENT:  Its hurting where he tacked it back.  Pain in R shoulder 8/10, MRI is scheduled  for 12/25/23  Lt shoulder 6/10   Pt leaving [facility] 12/14/23   PERTINENT HISTORY: HTN CVA DM Atrial fibrillation  PAIN:  Are you having pain? Yes: NPRS scale: 5/10 L shoulder  Pain location: deep in the shoulder joint  Pain description: sore achy and sharp Aggravating factors: holding it up, using it  Relieving factors: hot shower, massager, meds tried not to take it too much   Are you having pain? Yes: NPRS scale: 8/10 R shoulder  Pain location: Back and Rt shoulder is keeping me up at night.  Pain description: sore achy and sharp Aggravating factors: holding it up, using it  Relieving factors: hot shower, massager, meds tried not to take it too much    PRECAUTIONS: Other: none   RED FLAGS: None   WEIGHT BEARING RESTRICTIONS: No  FALLS:  Has patient fallen in last  6 months? 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   PLOF: Independent with household mobility without device, Independent with community mobility without device, Needs assistance with ADLs, and Leisure: has a CNA in [place]  PATIENT GOALS:Patient wants to get ready for the highway.  Get back to fishing    OBJECTIVE:  Note: Objective measures were complet ed at Evaluation unless otherwise noted. PATIENT SURVEYS:  Cindia Crease 89 11/09/23: Quick DASH 59%   SENSATION: L biceps numb and tinging to fingers at times.  He reports this in the L UE as well.   POSTURE: Leans head to the L, shoulders elevated, shoulders rounded   UPPER EXTREMITY ROM:   Passive ROM Lt  11/02/23 11/06/23 Lt 11/09/23 11/21/23  11/23/23 LT  Shoulder flexion 132 A 140 A AAROM 146 at wall  AROM 140 145  Shoulder extension       Shoulder abduction 80 A 130 A 120 deg   130  Shoulder adduction       Shoulder internal rotation   FR to upper lumbar  In abd, 65 deg IR     Shoulder external rotation   FR to back of neck  At 50 deg abd, AROM to 56 deg in ER   T2  Elbow flexion       Elbow extension        UPPER EXTREMITY MMT: NT due to post surgical. Appears to be 3-/5  MMT Right 10/12/23 Left 10/12/23 Lt.  11/09/23  Shoulder flexion 4+/5 3-/5 3+/5  Shoulder extension     Shoulder abduction 4/5 3-/5 3-/5  Shoulder adduction     Shoulder internal rotation  4/5 5/5  Shoulder external rotation  3+/5 3+/5  Middle trapezius     Lower trapezius     Elbow flexion   4+  Elbow extension   4+  Wrist flexion     Wrist extension     Wrist ulnar deviation     Wrist radial deviation     Wrist pronation     Wrist supination     Grip strength (lbs) 70 83   (Blank rows = not tested)  SHOULDER  SPECIAL TESTS: NT  JOINT MOBILITY TESTING:  Stiffness   PALPATION:  In TTP along Lt posterior and lateral shoulder                                                                                                                              TREATMENT DATE:  [facility] Adult PT Treatment:                                                DATE: 12/04/23 Therapeutic Activity: UBE level 1, 5 min  Shoulder flexion, chest press GTB  x 15  ER double arm GTB x 15  Wall slide flexion Red band flexion wall self anchored  Red looped band overhead lift x 10  Doorway stretch x 5  Sidelying 3 lbs reverse fly x 15 ,  Seated lateral and forward raise combo x 10 each     [facility] Adult PT Treatment:                                                DATE: 11/30/23  Therapeutic Activity UBE 2 mins FWD, 2 mins BWD Shoulder row 2x15 BluTB Shoulder ext 2x15 BluTB Left shoudler IR Blue 2x 15 Left shoudler ER BTB 2x 15 Standing Left forward raise 2# 2 x 10  left  Standing lateral raise 2# 2 x 10 left  Standing OH press 3# 2 x 10 left  Ball on wall circles 30 sec each direction  OH wall wash 60 sec  Abduction wall circles 30 sec x 2     [facility] Adult PT Treatment:                                                DATE: 11/28/23 Manual Therapy: STM to the L teres minor and major, and lat dorsi Skilled palpation to identify Tps and taut muscle bands Trigger Point Dry Needling  Subsequent Treatment: Instructions provided previously at initial dry needling treatment.   Patient Verbal Consent Given: Yes Education Handout Provided: Previously Provided Muscles Treated: L teres minor and major, lat dorsi Electrical Stimulation Performed: Yes  Treatment Response/Outcome: Muscle twitch response   Therapeutic Activity UBE 2 mins FWD, 2 mins BWD Standing shoulder flexion 2 x 10 AROM Wall slide scaption x10 Standing shoulder scaption 2 x 10 AROM Standing shoulder flex and scaption x10 each Shoulder row 2x15  BluTB Shoulder ext 2x15 BluTB Left shoudler IR Blue 2x 15 Left shoudler ER GTB 2x 15 Modalities: Estim mA, 6 pps, intensity as tolerated 10 mins   [facility] Adult PT Treatment:  DATE: 11/23/23 Therapeutic Activity UBE 2 mins FWD, 2 mins BWD Standing shoulder flexion x 10 AROM Shoulder row 2x15 BluTB Shoulder ext 2x15 BluTB Left shoudler IR Blue 2x 15 Left shoudler ER GTB 2x 15 Standing Left forward raise 1# 2 x 10  left  Standing lateral raise 1# 2 x 10 left  Standing OH press 1# 2 x 10 left  Side abduction 2# 10 x 2  Side ER 10 x 2 2#  Supine shoulder flexion 1 # x 10, 2# x 10    PATIENT EDUCATION: Education details: HEP review Person educated: Patient Education method: Programmer, multimedia, Facilities manager, Verbal cues Education comprehension: verbalized understanding and needs further education  HOME EXERCISE PROGRAM: Access Code: [email or web address] URL: [email or web address] Date: 10/27/2023 Prepared by: Liborio Reeds  Exercises - Supine Shoulder Press AAROM in Abduction with Dowel  - 1-3 x daily - 7 x weekly - 2 sets - 10 reps - 5 hold - Supine Shoulder Flexion Extension AAROM with Dowel  - 1-3 x daily - 7 x weekly - 2 sets - 10 reps - 5 hold - Supine Shoulder External Rotation with Dowel  - 1-3 x daily - 7 x weekly - 2 sets - 10 reps - 5 hold - Standing Shoulder Row with Anchored Resistance  - 1 x daily - 7 x weekly - 2 sets - 10 reps - 5 hold - Shoulder extension with resistance - Neutral  - 1 x daily - 7 x weekly - 2 sets - 10 reps - 5 hold - Seated Shoulder External Rotation PROM on Table  - 1 x daily - 7 x weekly - 2 sets - 10 reps - 30 hold - Seated Single Arm Shoulder Full Can AROM  - 1 x daily - 7 x weekly - 2 sets - 10 reps - 5 hold - Seated Single Arm Shoulder External Rotation with Self-Anchored Resistance  - 1 x daily - 7 x weekly - 2 sets - 10 reps - 5 hold - Shoulder Flexion Serratus Activation with Resistance  - 1 x daily -  7 x weekly - 2 sets - 10 reps - 30 hold - Sidelying Shoulder ER with Towel and Dumbbell  - 1 x daily - 7 x weekly - 3 sets - 10 reps - Sidelying Shoulder Abduction Palm Forward  - 1 x daily - 7 x weekly - 3 sets - 10 reps   ASSESSMENT:  CLINICAL IMPRESSION:  Pt arrived late for his PT appt today. Patient reports pain in the back of his L shoulder which he reports as new. Pt has 1 more visit then will be   discharged to HEP and likely Rt shoulder surgery.  Despite pain he can tolerate strengthening quite will as long as arm is not fully in overhead flexion.   EVAL: Patient is a 63 y.o. male who was seen today for physical therapy evaluation and treatment for L shoulder surgery. He reports he has been working on moving his shoulder at home.  He is 6 weeks out from surgery.  He will transition to light strengthening but will ensure he is following MD protocol.   OBJECTIVE IMPAIRMENTS: cardiopulmonary status limiting activity, decreased mobility, decreased ROM, decreased strength, hypomobility, increased fascial restrictions, impaired flexibility, impaired sensation, impaired UE functional use, improper body mechanics, postural dysfunction, obesity, and pain.   ACTIVITY LIMITATIONS: carrying, lifting, sitting, standing, sleeping, stairs, bed mobility, bathing, dressing, reach over head, hygiene/grooming, locomotion level, and caring for others  PARTICIPATION LIMITATIONS: meal prep, cleaning,  interpersonal relationship, driving, shopping, community activity, yard work, and recreation  PERSONAL FACTORS: Education, Past/current experiences, Social background, and 3+ comorbidities: HTN, CVA , DM  are also affecting patient's functional outcome.    GOALS: Goals reviewed with patient? Yes  SHORT TERM GOALS: Target date: 10/09/2023  Pt will be I with HEP for shoulder A/AROM, strength  Baseline:given on eval  Goal status: MET  2.  Pt will be able to use L arm for dressing and bathing with min  difficulty.  Baseline: needs A  Goal status: MET  3.  Pt will note pain at rest, no more than min for recreation, meals Baseline: moderate Goal status:ongoing   4.  Pt will be able to use pillows/ice/heat appropriately for pain mgmt at home.  Baseline: needs reinforcement  Goal status: MET   LONG TERM GOALS: Target date: 01/04/2024   Pt will be I with HEP for final HEP upon discharge Baseline: unknown  Goal status: ongoing   2.  Pt will be able to carry 1-3 light items in his L UE down by his side (groceries) without increased pain  Baseline:can do this now Goal status: MET  3.  Pt will be able to improve ability to sleep/rest with only min disturbance from sleep.  Baseline: severely limited , mostly due to Rt UE  Goal status: ongoing   4.  Pt will be able to report no sensory symptoms in LUE  Baseline: moderate N/T in L UE  Goal status: ongoing   5.  DAsh will improve to 75% or better  Baseline: 89% Goal status: MET   PLAN: PT FREQUENCY: 2x/week  PT DURATION: 8 weeks  PLANNED INTERVENTIONS: 97164- PT Re-evaluation, 97110-Therapeutic exercises, 97530- Therapeutic activity, 97112- Neuromuscular re-education, 97535- Self Care, [id number]- Manual therapy, Patient/Family education, Taping, Dry Needling, Joint mobilization, Cryotherapy, and Moist heat  PLAN FOR NEXT SESSION: strength, endurance, finalize HEP and DC>  Marci Setter, PT 12/04/23 12:09 PM Phone: [phone] Fax: [phone]

## 2023-12-02 DIAGNOSIS — I639 Cerebral infarction, unspecified: Secondary | ICD-10-CM | POA: Diagnosis not present

## 2023-12-03 DIAGNOSIS — I639 Cerebral infarction, unspecified: Secondary | ICD-10-CM | POA: Diagnosis not present

## 2023-12-04 ENCOUNTER — Other Ambulatory Visit: Payer: Self-pay | Admitting: Family Medicine

## 2023-12-04 ENCOUNTER — Encounter: Payer: Self-pay | Admitting: Physical Therapy

## 2023-12-04 ENCOUNTER — Ambulatory Visit: Admitting: Physical Therapy

## 2023-12-04 DIAGNOSIS — M6281 Muscle weakness (generalized): Secondary | ICD-10-CM

## 2023-12-04 DIAGNOSIS — M25512 Pain in left shoulder: Secondary | ICD-10-CM | POA: Diagnosis not present

## 2023-12-04 DIAGNOSIS — E1169 Type 2 diabetes mellitus with other specified complication: Secondary | ICD-10-CM

## 2023-12-04 DIAGNOSIS — I639 Cerebral infarction, unspecified: Secondary | ICD-10-CM | POA: Diagnosis not present

## 2023-12-04 DIAGNOSIS — Z9889 Other specified postprocedural states: Secondary | ICD-10-CM

## 2023-12-04 DIAGNOSIS — M25612 Stiffness of left shoulder, not elsewhere classified: Secondary | ICD-10-CM

## 2023-12-05 DIAGNOSIS — I639 Cerebral infarction, unspecified: Secondary | ICD-10-CM | POA: Diagnosis not present

## 2023-12-06 ENCOUNTER — Ambulatory Visit (INDEPENDENT_AMBULATORY_CARE_PROVIDER_SITE_OTHER)

## 2023-12-06 DIAGNOSIS — J309 Allergic rhinitis, unspecified: Secondary | ICD-10-CM

## 2023-12-06 DIAGNOSIS — I639 Cerebral infarction, unspecified: Secondary | ICD-10-CM | POA: Diagnosis not present

## 2023-12-07 DIAGNOSIS — I639 Cerebral infarction, unspecified: Secondary | ICD-10-CM | POA: Diagnosis not present

## 2023-12-08 DIAGNOSIS — I639 Cerebral infarction, unspecified: Secondary | ICD-10-CM | POA: Diagnosis not present

## 2023-12-09 DIAGNOSIS — I639 Cerebral infarction, unspecified: Secondary | ICD-10-CM | POA: Diagnosis not present

## 2023-12-10 DIAGNOSIS — I639 Cerebral infarction, unspecified: Secondary | ICD-10-CM | POA: Diagnosis not present

## 2023-12-11 DIAGNOSIS — I639 Cerebral infarction, unspecified: Secondary | ICD-10-CM | POA: Diagnosis not present

## 2023-12-12 ENCOUNTER — Encounter: Payer: Self-pay | Admitting: Physical Therapy

## 2023-12-12 ENCOUNTER — Ambulatory Visit: Admitting: Physical Therapy

## 2023-12-12 DIAGNOSIS — I639 Cerebral infarction, unspecified: Secondary | ICD-10-CM | POA: Diagnosis not present

## 2023-12-12 DIAGNOSIS — Z9889 Other specified postprocedural states: Secondary | ICD-10-CM

## 2023-12-12 DIAGNOSIS — M25512 Pain in left shoulder: Secondary | ICD-10-CM

## 2023-12-12 NOTE — Therapy (Addendum)
 OUTPATIENT PHYSICAL THERAPY TREATMENT DISCHARGE   Patient Name: Jason Stokes MRN: [id number] DOB:July 12, 1961, 63 y.o., male Today's Date: 12/12/2023   PHYSICAL THERAPY DISCHARGE SUMMARY  Visits from Start of Care: 20  Current functional level related to goals / functional outcomes: See below    Remaining deficits: Pain, shoulder strength    Education / Equipment: HEP, RICE,  posture, body mechanics   Patient agrees to discharge. Patient goals were met. Patient is being discharged due to maximized rehab potential.     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)    a.) s/p embolization 08/19/2020: 29 mm RIGHT internal iliac artery aneurysm   Anxiety    Aortic atherosclerosis (HCC)    Bilateral carpal tunnel syndrome 01/10/2018   Bipolar disorder (HCC)    Chorioretinal inflammation of both eyes    a.) on azothioprine   Chronic lower back pain    Coronary artery calcification seen on CT scan    a.) cCTA 04/21/2022: Ca score 20.6 (61st percentile for age/sex match control)   DDD (degenerative disc disease), cervical    Depression    Diastolic dysfunction    a.) TTE 01/13/2021: EF 60-65%, mod LVH, triv MR, G1DD   GERD (gastroesophageal reflux disease)    Hepatic steatosis    History of alcohol abuse    History of nuclear stress test    Myoview 10/16: EF 50%, diaphragmatic attenuation, no ischemia, low risk   Hypertension    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   a.) CT head 12/25/2012 --> RIGHT basal ganglia hypoattenuation related to remote lacunar infarct   Lipoma     Long term (current) use of anticoagulants    a.) apixaban    Long-term current use of immunomodulator    a.) on azothioprine for peripheral focal chorioretinal inflammation (both eyes)   Marijuana use    Migraine    Moderate persistent asthma with acute exacerbation 05/02/2018   PAF (paroxysmal atrial fibrillation) (HCC) 03/27/2015   a.) CHA2DS2VASc = 5 (HTN, CVA x2, vascular disease history, T2DM);  b.) s/p ablation 10/02/2015; c.) s/p ablation 12/10/2015; d.) s/p DCCV (200 J x 1) 12/11/2015; e.) s/p ablation 04/28/2022; f.) rate/rhythm maintained on oral diltiazem  + carvedilol ; chronically anticoagulated with apixaban    Pilonidal cyst    Schizophrenia (HCC)    Sciatica neuralgia    T2DM (type 2 diabetes mellitus) (HCC)    Thoracic aortic ectasia (HCC) 01/13/2021   a.) TTE 01/13/2021: Ao root 38 mm, asc Ao 39 mm   Past Surgical History:  Procedure Laterality Date   ATRIAL FIBRILLATION ABLATION  09/22/2015   ATRIAL FIBRILLATION ABLATION N/A 04/28/2022   Procedure: ATRIAL FIBRILLATION ABLATION;  Surgeon: Inocencio Soyla Lunger, MD;  Location: [facility];  Service: Cardiovascular;  Laterality: N/A;   CATARACT EXTRACTION Bilateral    CYST EXCISION  1996-97   surgery back of head  ELECTROPHYSIOLOGIC STUDY N/A 09/22/2015   Procedure: Atrial Fibrillation Ablation;  Surgeon: Will Gladis Norton, MD;  Location: [facility];  Service: Cardiovascular;  Laterality: N/A;   ELECTROPHYSIOLOGIC STUDY N/A 12/10/2015   Procedure: Atrial Fibrillation Ablation;  Surgeon: Will Gladis Norton, MD;  Location: [facility];  Service: Cardiovascular;  Laterality: N/A;   ELECTROPHYSIOLOGIC STUDY N/A 12/11/2015   Procedure: Cardioversion;  Surgeon: Will Gladis Norton, MD;  Location: [facility];  Service: Cardiovascular;  Laterality: N/A;   EMBOLIZATION (CATH LAB) Right 08/19/2020   Procedure: EMBOLIZATION;  Surgeon: Magda Debby SAILOR, MD;  Location: [facility];  Service:  Cardiovascular;  Laterality: Right;  hypogastric   EXCISION MASS HEAD N/A 01/06/2017   Procedure: EXCISION MASS FOREHEAD;  Surgeon: Arelia Filippo, MD;  Location: [facility];  Service: Plastics;  Laterality: N/A;   EXCISION MASS UPPER EXTREMETIES Right 08/08/2022   Procedure: EXCISION MASS RIGHT FOREARM;  Surgeon: Murrell Drivers, MD;  Location: [facility];  Service: Orthopedics;  Laterality: Right;  45 MIN   GANGLION CYST EXCISION Left    INTERCOSTAL NERVE BLOCK  07/19/2003   KNEE ARTHROSCOPY Right 07/18/2014   MASS EXCISION N/A 01/25/2021   Procedure: EXCISION SUBCUTANEOUS VS SUBFASCIAL MASS TORSO 3CM;  Surgeon: Arelia Filippo, MD;  Location: [facility];  Service: Plastics;  Laterality: N/A;   PILONIDAL CYST EXCISION N/A 09/13/2022   Procedure: CYST EXCISION PILONIDAL EXTENSIVE;  Surgeon: Desiderio Schanz, MD;  Location: [facility];  Service: General;  Laterality: N/A;   SHOULDER ARTHROSCOPY WITH ROTATOR CUFF REPAIR AND OPEN BICEPS TENODESIS Left 07/26/2023   Procedure: LEFT SHOULDER ARTHROSCOPY WITH OPEN ROTATOR CUFF REPAIR AND OPEN BICEPS TENODESIS;  Surgeon: Barbarann Oneil BROCKS, MD;  Location: [facility];  Service: Orthopedics;  Laterality: Left;   Patient Active Problem List   Diagnosis Date Noted   Impingement syndrome of right shoulder 10/03/2023   Tendinitis of upper biceps tendon of left shoulder 06/12/2023   Complete tear of left rotator cuff 10/28/2022   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   Pilonidal cyst 09/13/2022   Aortic atherosclerosis (HCC) 06/07/2022   Hypercoagulable state due to paroxysmal atrial fibrillation (HCC) 03/23/2021   Type 2 diabetes mellitus with hyperglycemia, without long-term current use of insulin (HCC) 02/10/2021   Neck pain on left side 03/06/2019   Musculoskeletal chest pain 07/24/2018   Asthma, mild intermittent 05/02/2018   Allergic rhinitis caused by mold 05/02/2018   Tobacco use  05/02/2018   Bilateral carpal tunnel syndrome 01/10/2018   Stroke (HCC)    Schizophrenia (HCC)    Migraine    History of nuclear stress test    History of alcohol abuse    GERD (gastroesophageal reflux disease)    Dysrhythmia    Chronic lower back pain    Anxiety    Arthritis of knee, right 04/12/2017   Bipolar disorder (HCC) 04/03/2017   Lipoma of forehead 09/22/2016   Trigger ring finger of right hand 06/22/2016   Paroxysmal atrial fibrillation (HCC)    AF (atrial fibrillation) (HCC) 12/10/2015   Eczema 07/10/2015   History of CVA (cerebrovascular accident) 05/04/2015   Tear of medial meniscus of right knee 03/18/2015   Chronic pain of right knee 03/12/2015   Other and unspecified hyperlipidemia 11/25/2013   Nasal congestion 11/25/2013   Sinusitis, chronic 05/09/2013   Chronic low back pain 10/12/2012   Lumbar radiculopathy 10/12/2012   Hypertension associated with diabetes (HCC) 10/12/2012  Depression 10/12/2012   Insomnia 10/12/2012    PCP: Corrina Sabin MD   REFERRING PROVIDER: Barbarann Anes MD   REFERRING DIAG: [phone] (ICD-10-CM) - Complete tear of left rotator cuff, unspecified whether traumatic  THERAPY DIAG:  Acute pain of left shoulder  Status post left rotator cuff repair  Rationale for Evaluation and Treatment: Rehabilitation  ONSET DATE: 07/26/23   SUBJECTIVE:        SUBJECTIVE STATEMENT:  Its hurting where he tacked it back.  Pain in R shoulder 8/10, MRI is scheduled  for 12/25/23  Lt shoulder 6/10   Pt leaving [facility] 12/14/23   PERTINENT HISTORY: HTN CVA DM Atrial fibrillation  PAIN:  Are you having pain? Yes: NPRS scale: 5/10 L shoulder  Pain location: deep in the shoulder joint  Pain description: sore achy and sharp Aggravating factors: holding it up, using it  Relieving factors: hot shower, massager, meds tried not to take it too much   Are you having pain? Yes: NPRS scale: 8/10 R shoulder  Pain location: Back and Rt shoulder is  keeping me up at night.  Pain description: sore achy and sharp Aggravating factors: holding it up, using it  Relieving factors: hot shower, massager, meds tried not to take it too much    PRECAUTIONS: Other: none   RED FLAGS: None   WEIGHT BEARING RESTRICTIONS: No  FALLS:  Has patient fallen in last 6 months? 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   PLOF: Independent with household mobility without device, Independent with community mobility without device, Needs assistance with ADLs, and Leisure: has a CNA in [place]  PATIENT GOALS:Patient wants to get ready for the highway.  Get back to fishing    OBJECTIVE:  Note: Objective measures were complet ed at Evaluation unless otherwise noted. PATIENT SURVEYS:  Junie Palin 89 11/09/23: Quick DASH 59%   SENSATION: L biceps numb and tinging to fingers at times.  He reports this in the L UE as well.   POSTURE: Leans head to the L, shoulders elevated, shoulders rounded   UPPER EXTREMITY ROM:   Passive ROM Lt  11/02/23 11/06/23 Lt 11/09/23 11/21/23  11/23/23 LT 12/12/23 Lt  Shoulder flexion 132 A 140 A AAROM 146 at wall  AROM 140 145 155  Shoulder extension        Shoulder abduction 80 A 130 A 120 deg   130 150  Shoulder adduction        Shoulder internal rotation   FR to upper lumbar  In abd, 65 deg IR    T12   Shoulder external rotation   FR to back of neck  At 50 deg abd, AROM to 56 deg in ER   T2 T2  Elbow flexion        Elbow extension         UPPER EXTREMITY MMT: NT due to post surgical. Appears to be 3-/5  MMT Right 10/12/23 Left 10/12/23 Lt.  11/09/23 Lt 12/12/23  Shoulder flexion 4+/5 3-/5 3+/5 4-  Shoulder extension      Shoulder abduction 4/5 3-/5 3-/5 4-  Shoulder adduction       Shoulder internal rotation  4/5 5/5   Shoulder external rotation  3+/5 3+/5 4-  Middle trapezius  Lower trapezius      Elbow flexion   4+   Elbow extension   4+   Wrist flexion      Wrist extension      Wrist ulnar deviation      Wrist radial deviation      Wrist pronation      Wrist supination      Grip strength (lbs) 70 83    (Blank rows = not tested)  SHOULDER SPECIAL TESTS: NT  JOINT MOBILITY TESTING:  Stiffness   PALPATION:  In TTP along Lt posterior and lateral shoulder                                                                                                                              TREATMENT DATE: Review Of HEP 12/12/23   [facility] Adult PT Treatment:                                                DATE: 12/04/23 Therapeutic Activity: UBE level 1, 5 min  Shoulder flexion, chest press GTB  x 15  ER double arm GTB x 15  Wall slide flexion Red band flexion wall self anchored  Red looped band overhead lift x 10  Doorway stretch x 5  Sidelying 3 lbs reverse fly x 15 ,  Seated lateral and forward raise combo x 10 each     [facility] Adult PT Treatment:                                                DATE: 11/30/23  Therapeutic Activity UBE 2 mins FWD, 2 mins BWD Shoulder row 2x15 BluTB Shoulder ext 2x15 BluTB Left shoudler IR Blue 2x 15 Left shoudler ER BTB 2x 15 Standing Left forward raise 2# 2 x 10  left  Standing lateral raise 2# 2 x 10 left  Standing OH press 3# 2 x 10 left  Ball on wall circles 30 sec each direction  OH wall wash 60 sec  Abduction wall circles 30 sec x 2        PATIENT EDUCATION: Education details: HEP review Person educated: Patient Education method: Programmer, multimedia, Facilities manager, Verbal cues Education comprehension: verbalized understanding and needs further education  HOME EXERCISE PROGRAM: Access Code: [email or web address] URL: [email or web address] Date: 12/12/2023 Prepared by: Harlene Persons  Exercises - Supine  Shoulder Press AAROM in Abduction with Dowel  - 1-3 x daily - 7 x weekly - 2 sets - 10 reps - 5 hold - Supine Shoulder Flexion Extension AAROM with Dowel  - 1-3 x daily - 7 x weekly - 2 sets - 10 reps - 5 hold - Supine Shoulder External Rotation with Dowel  -  1-3 x daily - 7 x weekly - 2 sets - 10 reps - 5 hold - Standing Shoulder Row with Anchored Resistance  - 1 x daily - 7 x weekly - 2 sets - 10 reps - 5 hold - Shoulder extension with resistance - Neutral  - 1 x daily - 7 x weekly - 2 sets - 10 reps - 5 hold - Seated Shoulder External Rotation PROM on Table  - 1 x daily - 7 x weekly - 2 sets - 10 reps - 30 hold - Seated Single Arm Shoulder Full Can AROM  - 1 x daily - 7 x weekly - 2 sets - 10 reps - 5 hold - Seated Single Arm Shoulder External Rotation with Self-Anchored Resistance  - 1 x daily - 7 x weekly - 2 sets - 10 reps - 5 hold - Shoulder Flexion Serratus Activation with Resistance  - 1 x daily - 7 x weekly - 2 sets - 10 reps - 30 hold - Sidelying Shoulder ER with Towel and Dumbbell  - 1 x daily - 7 x weekly - 3 sets - 10 reps - Sidelying Shoulder Abduction Palm Forward  - 1 x daily - 7 x weekly - 3 sets - 10 reps - Single Arm Shoulder Flexion with Dumbbell  - 1 x daily - 7 x weekly - 2 sets - 10 reps - Standing Single Arm Shoulder Abduction with Dumbbell - Palm Down  - 1 x daily - 7 x weekly - 2 sets - 10 reps - Shoulder Overhead Press in Flexion with Dumbbells  - 1 x daily - 7 x weekly - 2 sets - 10 reps - Sidelying Shoulder External Rotation Dumbbell  - 1 x daily - 7 x weekly - 2 sets - 10 reps - Standing Wall [employer] with Mini Swiss Ball  - 1 x daily - 7 x weekly - 2-3 reps - 30 hold - Standing Wall Ball Circles in Scaption with Mini Swiss Ball  - 1 x daily - 7 x weekly - 3 sets - 2-3 reps - 30 hold   ASSESSMENT:  CLINICAL IMPRESSION:  Pt arrived late for his PT appt today. Patient reports pain in left shoulder averages 7/10. He no longer has N/T in LUE. He is most bothered  by R shoulder pain and will have surgical procedure soon. He is independent with HEP and tolerates strengthening of L shoulder well. He continues to have disturbed sleep due to Lt and Rt shoulder pain.  Despite pain he can tolerate strengthening quite will as long as arm is not fully in overhead flexion. He has made gains in strength and ROM. Today, reviewed his HEP and completed last goal check- see LTG section and objective measures. He will continue HEP and be discharged from PT today.   EVAL: Patient is a 63 y.o. male who was seen today for physical therapy evaluation and treatment for L shoulder surgery. He reports he has been working on moving his shoulder at home.  He is 6 weeks out from surgery.  He will transition to light strengthening but will ensure he is following MD protocol.   OBJECTIVE IMPAIRMENTS: cardiopulmonary status limiting activity, decreased mobility, decreased ROM, decreased strength, hypomobility, increased fascial restrictions, impaired flexibility, impaired sensation, impaired UE functional use, improper body mechanics, postural dysfunction, obesity, and pain.   ACTIVITY LIMITATIONS: carrying, lifting, sitting, standing, sleeping, stairs, bed mobility, bathing, dressing, reach over head, hygiene/grooming, locomotion level, and caring for others  PARTICIPATION  LIMITATIONS: meal prep, cleaning, interpersonal relationship, driving, shopping, community activity, yard work, and recreation  PERSONAL FACTORS: Education, Past/current experiences, Social background, and 3+ comorbidities: HTN, CVA , DM  are also affecting patient's functional outcome.    GOALS: Goals reviewed with patient? Yes  SHORT TERM GOALS: Target date: 10/09/2023  Pt will be I with HEP for shoulder A/AROM, strength  Baseline:given on eval  Goal status: MET  2.  Pt will be able to use L arm for dressing and bathing with min difficulty.  Baseline: needs A  Goal status: MET  3.  Pt will note pain at  rest, no more than min for recreation, meals Baseline: moderate 12/12/23: avg 7/10, able to tolerate strengthening program  Goal status:NOT MET  4.  Pt will be able to use pillows/ice/heat appropriately for pain mgmt at home.  Baseline: needs reinforcement  Goal status: MET   LONG TERM GOALS: Target date: 01/04/2024   Pt will be I with HEP for final HEP upon discharge Baseline: unknown  Goal status: MET  2.  Pt will be able to carry 1-3 light items in his L UE down by his side (groceries) without increased pain  Baseline:can do this now Goal status: MET  3.  Pt will be able to improve ability to sleep/rest with only min disturbance from sleep.  Baseline: severely limited , mostly due to Rt UE  12/12/23: both shoulders disturb sleep Goal status: NOT MET  4.  Pt will be able to report no sensory symptoms in LUE  Baseline: moderate N/T in L UE  12/12/23: no N/T in L UE Goal status: MET  5.  DAsh will improve to 75% or better  Baseline: 89% Goal status: MET   PLAN: PT FREQUENCY: 2x/week  PT DURATION: 8 weeks  PLANNED INTERVENTIONS: 97164- PT Re-evaluation, 97110-Therapeutic exercises, 97530- Therapeutic activity, 97112- Neuromuscular re-education, 97535- Self Care, 02859- Manual therapy, Patient/Family education, Taping, Dry Needling, Joint mobilization, Cryotherapy, and Moist heat  PLAN FOR NEXT SESSION: N/A Dc to HEP  Harlene Persons, PTA 12/12/23 9:31 AM Phone: [phone] Fax: [phone]

## 2023-12-13 ENCOUNTER — Ambulatory Visit (INDEPENDENT_AMBULATORY_CARE_PROVIDER_SITE_OTHER): Payer: Self-pay

## 2023-12-13 DIAGNOSIS — I639 Cerebral infarction, unspecified: Secondary | ICD-10-CM | POA: Diagnosis not present

## 2023-12-13 DIAGNOSIS — J309 Allergic rhinitis, unspecified: Secondary | ICD-10-CM

## 2023-12-14 DIAGNOSIS — I639 Cerebral infarction, unspecified: Secondary | ICD-10-CM | POA: Diagnosis not present

## 2023-12-15 DIAGNOSIS — I639 Cerebral infarction, unspecified: Secondary | ICD-10-CM | POA: Diagnosis not present

## 2023-12-16 DIAGNOSIS — I639 Cerebral infarction, unspecified: Secondary | ICD-10-CM | POA: Diagnosis not present

## 2023-12-17 DIAGNOSIS — I639 Cerebral infarction, unspecified: Secondary | ICD-10-CM | POA: Diagnosis not present

## 2023-12-18 DIAGNOSIS — I639 Cerebral infarction, unspecified: Secondary | ICD-10-CM | POA: Diagnosis not present

## 2023-12-18 NOTE — Progress Notes (Signed)
 VIALS EXP 12-18-23

## 2023-12-19 ENCOUNTER — Ambulatory Visit: Payer: Medicaid Other | Admitting: Family Medicine

## 2023-12-19 DIAGNOSIS — E1169 Type 2 diabetes mellitus with other specified complication: Secondary | ICD-10-CM

## 2023-12-19 DIAGNOSIS — I639 Cerebral infarction, unspecified: Secondary | ICD-10-CM | POA: Diagnosis not present

## 2023-12-19 DIAGNOSIS — J3081 Allergic rhinitis due to animal (cat) (dog) hair and dander: Secondary | ICD-10-CM | POA: Diagnosis not present

## 2023-12-20 DIAGNOSIS — I639 Cerebral infarction, unspecified: Secondary | ICD-10-CM | POA: Diagnosis not present

## 2023-12-20 DIAGNOSIS — J3089 Other allergic rhinitis: Secondary | ICD-10-CM | POA: Diagnosis not present

## 2023-12-21 ENCOUNTER — Encounter: Admitting: Physical Therapy

## 2023-12-21 DIAGNOSIS — I639 Cerebral infarction, unspecified: Secondary | ICD-10-CM | POA: Diagnosis not present

## 2023-12-22 DIAGNOSIS — I639 Cerebral infarction, unspecified: Secondary | ICD-10-CM | POA: Diagnosis not present

## 2023-12-23 DIAGNOSIS — I639 Cerebral infarction, unspecified: Secondary | ICD-10-CM | POA: Diagnosis not present

## 2023-12-24 DIAGNOSIS — I639 Cerebral infarction, unspecified: Secondary | ICD-10-CM | POA: Diagnosis not present

## 2023-12-25 ENCOUNTER — Inpatient Hospital Stay: Admission: RE | Admit: 2023-12-25 | Source: Ambulatory Visit

## 2023-12-25 ENCOUNTER — Telehealth: Payer: Self-pay | Admitting: Family Medicine

## 2023-12-25 DIAGNOSIS — I639 Cerebral infarction, unspecified: Secondary | ICD-10-CM | POA: Diagnosis not present

## 2023-12-25 NOTE — Telephone Encounter (Unsigned)
 Copied from CRM [id number]. Topic: Clinical - Medication Refill >> Dec 25, 2023 10:37 AM Baldemar Lev wrote: Medication: Accu Check Guide needles  Has the patient contacted their pharmacy? Yes (Agent: If no, request that the patient contact the pharmacy for the refill. If patient does not wish to contact the pharmacy document the reason why and proceed with request.) (Agent: If yes, when and what did the pharmacy advise?)  This is the patient's preferred pharmacy:  [id number] - [place], [place] - [address] AT [facility] [address] [place] [address] Phone: [phone] Fax: [phone]  Is this the correct pharmacy for this prescription? Yes If no, delete pharmacy and type the correct one.   Has the prescription been filled recently? Yes  Is the patient out of the medication? Yes  Has the patient been seen for an appointment in the last year OR does the patient have an upcoming appointment? Yes  Can we respond through MyChart? Yes  Agent: Please be advised that Rx refills may take up to 3 business days. We ask that you follow-up with your pharmacy.

## 2023-12-25 NOTE — Telephone Encounter (Signed)
 Last Fill: 02/10/21 not on current med list  Last OV: 06/19/23 Next OV: 01/31/24   Routing to provider for review/authorization.

## 2023-12-26 DIAGNOSIS — I639 Cerebral infarction, unspecified: Secondary | ICD-10-CM | POA: Diagnosis not present

## 2023-12-27 ENCOUNTER — Ambulatory Visit (INDEPENDENT_AMBULATORY_CARE_PROVIDER_SITE_OTHER): Payer: Self-pay

## 2023-12-27 DIAGNOSIS — J309 Allergic rhinitis, unspecified: Secondary | ICD-10-CM

## 2023-12-27 DIAGNOSIS — I639 Cerebral infarction, unspecified: Secondary | ICD-10-CM | POA: Diagnosis not present

## 2023-12-28 DIAGNOSIS — I639 Cerebral infarction, unspecified: Secondary | ICD-10-CM | POA: Diagnosis not present

## 2023-12-29 DIAGNOSIS — I639 Cerebral infarction, unspecified: Secondary | ICD-10-CM | POA: Diagnosis not present

## 2023-12-30 DIAGNOSIS — I639 Cerebral infarction, unspecified: Secondary | ICD-10-CM | POA: Diagnosis not present

## 2023-12-31 ENCOUNTER — Other Ambulatory Visit: Payer: Self-pay | Admitting: Allergy and Immunology

## 2023-12-31 DIAGNOSIS — I639 Cerebral infarction, unspecified: Secondary | ICD-10-CM | POA: Diagnosis not present

## 2024-01-01 DIAGNOSIS — I639 Cerebral infarction, unspecified: Secondary | ICD-10-CM | POA: Diagnosis not present

## 2024-01-01 MED ORDER — MONTELUKAST SODIUM 10 MG PO TABS: 10.0000 mg | ORAL_TABLET | Freq: Every day | ORAL | 1 refills | Status: DC

## 2024-01-01 NOTE — Addendum Note (Signed)
 Addended by: Merwyn Achilles on: 01/01/2024 02:15 PM   Modules accepted: Orders

## 2024-01-02 DIAGNOSIS — I639 Cerebral infarction, unspecified: Secondary | ICD-10-CM | POA: Diagnosis not present

## 2024-01-03 ENCOUNTER — Telehealth: Payer: Self-pay

## 2024-01-03 ENCOUNTER — Other Ambulatory Visit: Payer: Self-pay

## 2024-01-03 DIAGNOSIS — I639 Cerebral infarction, unspecified: Secondary | ICD-10-CM | POA: Diagnosis not present

## 2024-01-03 MED ORDER — ACCU-CHEK SOFTCLIX LANCETS MISC: 12 refills | Status: AC

## 2024-01-03 NOTE — Telephone Encounter (Signed)
 Copied from CRM [id number]. Topic: Clinical - Medication Refill >> Dec 25, 2023 10:37 AM Baldemar Lev wrote: Medication: Accu Check Guide needles  Has the patient contacted their pharmacy? Yes (Agent: If no, request that the patient contact the pharmacy for the refill. If patient does not wish to contact the pharmacy document the reason why and proceed with request.) (Agent: If yes, when and what did the pharmacy advise?)  This is the patient's preferred pharmacy:  [id number] - [place], [place] - [address] AT [facility] [address] [place] [address] Phone: [phone] Fax: [phone]  Is this the correct pharmacy for this prescription? Yes If no, delete pharmacy and type the correct one.   Has the prescription been filled recently? Yes  Is the patient out of the medication? Yes  Has the patient been seen for an appointment in the last year OR does the patient have an upcoming appointment? Yes  Can we respond through MyChart? Yes  Agent: Please be advised that Rx refills may take up to 3 business days. We ask that you follow-up with your pharmacy. >> Jan 03, 2024  9:00 AM Crispin Dolphin wrote: Patient called to check status of request. States he now needs strips and needles. Would like for Provider assistant to give him a call. Thank You

## 2024-01-03 NOTE — Telephone Encounter (Signed)
 REFILL SENT

## 2024-01-04 DIAGNOSIS — I639 Cerebral infarction, unspecified: Secondary | ICD-10-CM | POA: Diagnosis not present

## 2024-01-05 ENCOUNTER — Ambulatory Visit (INDEPENDENT_AMBULATORY_CARE_PROVIDER_SITE_OTHER): Payer: Self-pay

## 2024-01-05 DIAGNOSIS — J309 Allergic rhinitis, unspecified: Secondary | ICD-10-CM | POA: Diagnosis not present

## 2024-01-05 DIAGNOSIS — I639 Cerebral infarction, unspecified: Secondary | ICD-10-CM | POA: Diagnosis not present

## 2024-01-06 DIAGNOSIS — I639 Cerebral infarction, unspecified: Secondary | ICD-10-CM | POA: Diagnosis not present

## 2024-01-07 DIAGNOSIS — I639 Cerebral infarction, unspecified: Secondary | ICD-10-CM | POA: Diagnosis not present

## 2024-01-08 DIAGNOSIS — I639 Cerebral infarction, unspecified: Secondary | ICD-10-CM | POA: Diagnosis not present

## 2024-01-09 DIAGNOSIS — I639 Cerebral infarction, unspecified: Secondary | ICD-10-CM | POA: Diagnosis not present

## 2024-01-10 ENCOUNTER — Ambulatory Visit
Admission: RE | Admit: 2024-01-10 | Discharge: 2024-01-10 | Disposition: A | Discharge status: Home / Self Care | Source: Ambulatory Visit | Attending: Orthopedic Surgery | Admitting: Orthopedic Surgery

## 2024-01-10 DIAGNOSIS — M75121 Complete rotator cuff tear or rupture of right shoulder, not specified as traumatic: Secondary | ICD-10-CM | POA: Diagnosis not present

## 2024-01-10 DIAGNOSIS — I639 Cerebral infarction, unspecified: Secondary | ICD-10-CM | POA: Diagnosis not present

## 2024-01-10 DIAGNOSIS — M25511 Pain in right shoulder: Secondary | ICD-10-CM | POA: Diagnosis not present

## 2024-01-10 MED ORDER — IOPAMIDOL (ISOVUE-M 200) INJECTION 41%: 12.0000 mL | Freq: Once | INTRAMUSCULAR | Status: DC

## 2024-01-11 ENCOUNTER — Ambulatory Visit

## 2024-01-11 DIAGNOSIS — J309 Allergic rhinitis, unspecified: Secondary | ICD-10-CM

## 2024-01-11 DIAGNOSIS — I639 Cerebral infarction, unspecified: Secondary | ICD-10-CM | POA: Diagnosis not present

## 2024-01-12 ENCOUNTER — Other Ambulatory Visit: Payer: Self-pay | Admitting: Allergy and Immunology

## 2024-01-12 DIAGNOSIS — I639 Cerebral infarction, unspecified: Secondary | ICD-10-CM | POA: Diagnosis not present

## 2024-01-14 DIAGNOSIS — I639 Cerebral infarction, unspecified: Secondary | ICD-10-CM | POA: Diagnosis not present

## 2024-01-15 NOTE — Telephone Encounter (Signed)
 Refill for albuterol  HFA x 1 with no refills. Patient is due for an OV soon.

## 2024-01-16 DIAGNOSIS — I639 Cerebral infarction, unspecified: Secondary | ICD-10-CM | POA: Diagnosis not present

## 2024-01-17 DIAGNOSIS — I639 Cerebral infarction, unspecified: Secondary | ICD-10-CM | POA: Diagnosis not present

## 2024-01-18 ENCOUNTER — Ambulatory Visit (INDEPENDENT_AMBULATORY_CARE_PROVIDER_SITE_OTHER)

## 2024-01-18 DIAGNOSIS — J309 Allergic rhinitis, unspecified: Secondary | ICD-10-CM | POA: Diagnosis not present

## 2024-01-18 DIAGNOSIS — I639 Cerebral infarction, unspecified: Secondary | ICD-10-CM | POA: Diagnosis not present

## 2024-01-19 DIAGNOSIS — I639 Cerebral infarction, unspecified: Secondary | ICD-10-CM | POA: Diagnosis not present

## 2024-01-20 ENCOUNTER — Other Ambulatory Visit: Payer: Self-pay | Admitting: Family Medicine

## 2024-01-20 DIAGNOSIS — I639 Cerebral infarction, unspecified: Secondary | ICD-10-CM | POA: Diagnosis not present

## 2024-01-21 DIAGNOSIS — I639 Cerebral infarction, unspecified: Secondary | ICD-10-CM | POA: Diagnosis not present

## 2024-01-22 DIAGNOSIS — I639 Cerebral infarction, unspecified: Secondary | ICD-10-CM | POA: Diagnosis not present

## 2024-01-23 DIAGNOSIS — I639 Cerebral infarction, unspecified: Secondary | ICD-10-CM | POA: Diagnosis not present

## 2024-01-24 DIAGNOSIS — I639 Cerebral infarction, unspecified: Secondary | ICD-10-CM | POA: Diagnosis not present

## 2024-01-25 ENCOUNTER — Ambulatory Visit

## 2024-01-25 DIAGNOSIS — J309 Allergic rhinitis, unspecified: Secondary | ICD-10-CM

## 2024-01-25 DIAGNOSIS — I639 Cerebral infarction, unspecified: Secondary | ICD-10-CM | POA: Diagnosis not present

## 2024-01-26 DIAGNOSIS — I639 Cerebral infarction, unspecified: Secondary | ICD-10-CM | POA: Diagnosis not present

## 2024-01-27 DIAGNOSIS — I639 Cerebral infarction, unspecified: Secondary | ICD-10-CM | POA: Diagnosis not present

## 2024-01-29 DIAGNOSIS — I639 Cerebral infarction, unspecified: Secondary | ICD-10-CM | POA: Diagnosis not present

## 2024-01-30 ENCOUNTER — Other Ambulatory Visit: Payer: Self-pay

## 2024-01-30 ENCOUNTER — Ambulatory Visit

## 2024-01-30 ENCOUNTER — Ambulatory Visit (INDEPENDENT_AMBULATORY_CARE_PROVIDER_SITE_OTHER): Payer: Medicaid Other | Admitting: Allergy and Immunology

## 2024-01-30 VITALS — BP 130/88 | HR 92 | Temp 98.6°F

## 2024-01-30 DIAGNOSIS — H1013 Acute atopic conjunctivitis, bilateral: Secondary | ICD-10-CM | POA: Diagnosis not present

## 2024-01-30 DIAGNOSIS — J3089 Other allergic rhinitis: Secondary | ICD-10-CM

## 2024-01-30 DIAGNOSIS — H1010 Acute atopic conjunctivitis, unspecified eye: Secondary | ICD-10-CM

## 2024-01-30 DIAGNOSIS — Z7729 Contact with and (suspected ) exposure to other hazardous substances: Secondary | ICD-10-CM | POA: Diagnosis not present

## 2024-01-30 DIAGNOSIS — J309 Allergic rhinitis, unspecified: Secondary | ICD-10-CM

## 2024-01-30 DIAGNOSIS — J4550 Severe persistent asthma, uncomplicated: Secondary | ICD-10-CM

## 2024-01-30 DIAGNOSIS — J301 Allergic rhinitis due to pollen: Secondary | ICD-10-CM

## 2024-01-30 DIAGNOSIS — I639 Cerebral infarction, unspecified: Secondary | ICD-10-CM | POA: Diagnosis not present

## 2024-01-30 NOTE — Progress Notes (Unsigned)
 [place] [facility] [place] [facility] [place] [facility] [place] [facility] [place]   Follow-up Note  Referring Provider: Delbert Clam, MD Primary Provider: Delbert Clam, MD Date of Office Visit: 01/30/2024  Subjective:   Jason Stokes (DOB: [birth year]) is a 63 y.o. male who [facility] in re-evaluation of the following:  HPI: Jason Stokes returns to this clinic in evaluation of asthma, allergic rhinoconjunctivitis, cannabis smoke exposure.  I last saw him in this clinic 01 August 2023.  He has done pretty well with his airway even as he is progressed to his spring time pollination season.  He continues to use anti-inflammatory agents for both his upper and lower airway on a consistent basis and continues on immunotherapy.  He believes that he does better with immunotherapy when utilized every week instead of stretching out the interval.  If he stretches out the interval past a week he does develop problems with both his nose and chest.  His requirement for short acting bronchodilator is less than 1 time per week.  He has not required a systemic steroid or an antibiotic for any type of airway issue.  He still continues to have smoke exposure about twice a week or so.  Allergies as of 01/30/2024       Reactions   Lisinopril Anaphylaxis   Swelling of lips and tongue.   Tylenol  [acetaminophen ] Other (See Comments)   Acid reflux    Lyrica  [pregabalin ] Palpitations        Medication List    Accu-Chek Guide Test test strip Generic drug: glucose blood USE TO CHECK BLOOD SUGAR ONCE DAILY. E11.69   Accu-Chek Softclix Lancets lancets Use as instructed   acetaminophen -codeine  300-30 MG tablet Commonly known as: TYLENOL  #3 1 po q 8hrs prn pain   atorvastatin  40 MG tablet Commonly known as: LIPITOR Take 1 tablet (40 mg total) by mouth daily.   azaTHIOprine 50 MG tablet Commonly known as: IMURAN Take 100 mg by mouth every morning.   azelastine  0.1 % nasal  spray Commonly known as: ASTELIN  Place 1 spray into both nostrils 2 (two) times daily. 1 spray each nostril twice a day   brimonidine 0.2 % ophthalmic solution Commonly known as: ALPHAGAN Place 1 drop into both eyes every 8 (eight) hours.   carvedilol  25 MG tablet Commonly known as: COREG  Take 1 tablet (25 mg total) by mouth 2 (two) times daily.   cetirizine  10 MG tablet Commonly known as: ZYRTEC  Take 1 tablet (10 mg total) by mouth daily as needed for allergies (Can take an extra dose during flare ups.).   cromolyn  4 % ophthalmic solution Commonly known as: OPTICROM  Place 1 drop into both eyes 4 (four) times daily as needed.   Difluprednate 0.05 % Emul Place 1 drop into both eyes 2 (two) times daily.   diltiazem  180 MG 24 hr capsule Commonly known as: CARDIZEM  CD TAKE 1 CAPSULE BY MOUTH EVERY DAY   dorzolamide-timolol 2-0.5 % ophthalmic solution Commonly known as: COSOPT Place 1 drop into both eyes 2 (two) times daily.   Dulera  200-5 MCG/ACT Aero Generic drug: mometasone-formoterol Inhale 2 puffs into the lungs 2 (two) times daily.   DULoxetine  60 MG capsule Commonly known as: CYMBALTA  TAKE 1 CAPSULE (60 MG TOTAL) BY MOUTH DAILY. FOR CHRONIC PAIN AND NEUROPATHY   Eliquis  5 MG Tabs tablet Generic drug: apixaban  TAKE 1 TABLET BY MOUTH TWICE A DAY   EPINEPHrine  0.3 mg/0.3 mL Soaj injection Commonly known as: EPI-PEN  Inject 0.3 mg into the muscle as needed for anaphylaxis.   fluticasone  50 MCG/ACT nasal spray Commonly known as: FLONASE  Place 1 spray into both nostrils 2 (two) times daily. 1 spray each nostril twice a day   folic acid 1 MG tablet Commonly known as: FOLVITE Take 1 mg by mouth daily.   furosemide  20 MG tablet Commonly known as: LASIX  TAKE 1 TABLET BY MOUTH EVERY DAY   GARLIC PO Take 1 tablet by mouth daily.   ipratropium 0.03 % nasal spray Commonly known as: ATROVENT  Place 2 sprays into both nostrils 4 (four) times daily as needed for  rhinitis.   latanoprost 0.005 % ophthalmic solution Commonly known as: XALATAN Place 1 drop into both eyes daily.   methocarbamol  500 MG tablet Commonly known as: ROBAXIN  Take 1 tablet (500 mg total) by mouth every 8 (eight) hours as needed for muscle spasms.   Misc. Devices Misc Back brace. Diagnosis chronic back pain   Misc. Devices Misc Left knee brace. Diagnosis L knee pain   montelukast  10 MG tablet Commonly known as: SINGULAIR  Take 1 tablet (10 mg total) by mouth at bedtime.   Olopatadine  HCl 0.2 % Soln Commonly known as: Pataday  Place 1 drop into both eyes daily as needed.   omeprazole  20 MG capsule Commonly known as: PRILOSEC TAKE 1 CAPSULE BY MOUTH EVERY DAY   oxyCODONE -acetaminophen  5-325 MG tablet Commonly known as: Percocet Take 1-2 tablets by mouth every 6 (six) hours as needed for severe pain (pain score 7-10).   valsartan  80 MG tablet Commonly known as: DIOVAN  TAKE 1 TABLET BY MOUTH EVERY DAY   Ventolin  HFA 108 (90 Base) MCG/ACT inhaler Generic drug: albuterol  INHALE 2 PUFFS INTO THE LUNGS EVERY 4 HOURS AS NEEDED FOR WHEEZING OR SHORTNESS OF BREATH.   vitamin D3 25 MCG tablet Commonly known as: CHOLECALCIFEROL Take 1,000 Units by mouth daily.    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)    a.) s/p embolization 08/19/2020: 29 mm RIGHT internal iliac artery aneurysm   Anxiety    Aortic atherosclerosis (HCC)    Bilateral carpal tunnel syndrome 01/10/2018   Bipolar disorder (HCC)    Chorioretinal inflammation of both eyes    a.) on azothioprine   Chronic lower back pain    Coronary artery calcification seen on CT scan    a.) cCTA 04/21/2022: Ca score 20.6 (61st percentile for age/sex match control)   DDD (degenerative disc disease), cervical    Depression    Diastolic dysfunction    a.) TTE 01/13/2021: EF 60-65%, mod LVH, triv MR, G1DD   GERD (gastroesophageal reflux disease)    Hepatic  steatosis    History of alcohol abuse    History of nuclear stress test    Myoview 10/16: EF 50%, diaphragmatic attenuation, no ischemia, low risk   Hypertension    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   a.) CT head 12/25/2012 --> RIGHT basal ganglia hypoattenuation related to remote lacunar infarct   Lipoma    Long term (current) use of anticoagulants    a.) apixaban    Long-term current use of immunomodulator    a.) on azothioprine for peripheral focal chorioretinal inflammation (both eyes)   Marijuana use    Migraine    Moderate persistent asthma with acute exacerbation 05/02/2018   PAF (paroxysmal atrial fibrillation) (HCC) 03/27/2015   a.) CHA2DS2VASc = 5 (HTN, CVA x2, vascular disease history, T2DM);  b.) s/p  ablation 10/02/2015; c.) s/p ablation 12/10/2015; d.) s/p DCCV (200 J x 1) 12/11/2015; e.) s/p ablation 04/28/2022; f.) rate/rhythm maintained on oral diltiazem  + carvedilol ; chronically anticoagulated with apixaban    Pilonidal cyst    Schizophrenia (HCC)    Sciatica neuralgia    T2DM (type 2 diabetes mellitus) (HCC)    Thoracic aortic ectasia (HCC) 01/13/2021   a.) TTE 01/13/2021: Ao root 38 mm, asc Ao 39 mm    Past Surgical History:  Procedure Laterality Date   ATRIAL FIBRILLATION ABLATION  09/22/2015   ATRIAL FIBRILLATION ABLATION N/A 04/28/2022   Procedure: ATRIAL FIBRILLATION ABLATION;  Surgeon: Inocencio Soyla Lunger, MD;  Location: [facility];  Service: Cardiovascular;  Laterality: N/A;   CATARACT EXTRACTION Bilateral    CYST EXCISION  1996-97   surgery back of head    ELECTROPHYSIOLOGIC STUDY N/A 09/22/2015   Procedure: Atrial Fibrillation Ablation;  Surgeon: Will Lunger Inocencio, MD;  Location: [facility];  Service: Cardiovascular;  Laterality: N/A;   ELECTROPHYSIOLOGIC STUDY N/A 12/10/2015   Procedure: Atrial Fibrillation Ablation;  Surgeon: Will Lunger Inocencio, MD;  Location: [facility];  Service: Cardiovascular;  Laterality: N/A;    ELECTROPHYSIOLOGIC STUDY N/A 12/11/2015   Procedure: Cardioversion;  Surgeon: Will Lunger Inocencio, MD;  Location: [facility];  Service: Cardiovascular;  Laterality: N/A;   EMBOLIZATION (CATH LAB) Right 08/19/2020   Procedure: EMBOLIZATION;  Surgeon: Magda Debby SAILOR, MD;  Location: [facility];  Service: Cardiovascular;  Laterality: Right;  hypogastric   EXCISION MASS HEAD N/A 01/06/2017   Procedure: EXCISION MASS FOREHEAD;  Surgeon: Arelia Filippo, MD;  Location: [facility];  Service: Plastics;  Laterality: N/A;   EXCISION MASS UPPER EXTREMETIES Right 08/08/2022   Procedure: EXCISION MASS RIGHT FOREARM;  Surgeon: Murrell Drivers, MD;  Location: [facility];  Service: Orthopedics;  Laterality: Right;  45 MIN   GANGLION CYST EXCISION Left    INTERCOSTAL NERVE BLOCK  07/19/2003   KNEE ARTHROSCOPY Right 07/18/2014   MASS EXCISION N/A 01/25/2021   Procedure: EXCISION SUBCUTANEOUS VS SUBFASCIAL MASS TORSO 3CM;  Surgeon: Arelia Filippo, MD;  Location: [facility];  Service: Plastics;  Laterality: N/A;   PILONIDAL CYST EXCISION N/A 09/13/2022   Procedure: CYST EXCISION PILONIDAL EXTENSIVE;  Surgeon: Desiderio Schanz, MD;  Location: [facility];  Service: General;  Laterality: N/A;   SHOULDER ARTHROSCOPY WITH ROTATOR CUFF REPAIR AND OPEN BICEPS TENODESIS Left 07/26/2023   Procedure: LEFT SHOULDER ARTHROSCOPY WITH OPEN ROTATOR CUFF REPAIR AND OPEN BICEPS TENODESIS;  Surgeon: Barbarann Oneil BROCKS, MD;  Location: [facility];  Service: Orthopedics;  Laterality: Left;    Review of systems negative except as noted in HPI / PMHx or noted below:  Review of Systems  Constitutional: Negative.   HENT: Negative.    Eyes: Negative.   Respiratory: Negative.    Cardiovascular: Negative.   Gastrointestinal: Negative.   Genitourinary: Negative.   Musculoskeletal: Negative.   Skin: Negative.   Neurological: Negative.   Endo/Heme/Allergies: Negative.    Psychiatric/Behavioral: Negative.       Objective:   Vitals:   01/30/24 0941 01/30/24 0951  BP: (!) 138/94 130/88  Pulse: 92   Temp: 98.6 F (37 C)   SpO2: 96%           Physical Exam Constitutional:      Appearance: He is not diaphoretic.  HENT:     Head: Normocephalic.     Right Ear: Tympanic membrane,  ear canal and external ear normal.     Left Ear: Tympanic membrane, ear canal and external ear normal.     Nose: Nose normal. No mucosal edema or rhinorrhea.     Mouth/Throat:     Pharynx: Uvula midline. No oropharyngeal exudate.  Eyes:     Conjunctiva/sclera: Conjunctivae normal.  Neck:     Thyroid : No thyromegaly.     Trachea: Trachea normal. No tracheal tenderness or tracheal deviation.  Cardiovascular:     Rate and Rhythm: Normal rate and regular rhythm.     Heart sounds: Normal heart sounds, S1 normal and S2 normal. No murmur heard. Pulmonary:     Effort: No respiratory distress.     Breath sounds: Normal breath sounds. No stridor. No wheezing or rales.  Lymphadenopathy:     Head:     Right side of head: No tonsillar adenopathy.     Left side of head: No tonsillar adenopathy.     Cervical: No cervical adenopathy.  Skin:    Findings: No erythema or rash.     Nails: There is no clubbing.  Neurological:     Mental Status: He is alert.     Diagnostics: Spirometry was performed and demonstrated an FEV1 of 2.94 at 90 % of predicted.  Assessment and Plan:   1. Asthma, severe persistent, well-controlled   2. Perennial allergic rhinitis   3. Seasonal allergic rhinitis due to pollen   4. Perennial allergic conjunctivitis of both eyes   5. Seasonal allergic conjunctivitis   6. Exposure to marijuana smoke    1.  Continue to Treat and prevent inflammation of airway:   A.  Flonase  1 spray each nostril twice a day  B.  Azelastine  nasal spray 1 spray each nostril twice a day  C.  Montelukast  10 mg 1 tablet 1 time per day  D.  Breo 200 - 1 inhalation 1 time  per day  E.  Immunotherapy  2. If needed:   A.  Cetirizine  10 mg 1 tablet 1-2 times a day  B.  Pataday  - 1 drop each eye 1 time per day  C.  Albuterol  2 puffs every 4-6 hours  3. Return to clinic in 6 months or earlier if problem  4. Influenza = Tamiflu. Covid = Paxlovid  Lois appears to be doing relatively well on his current plan of using anti-inflammatory agents for both his upper and lower airway and continuing on immunotherapy.  He will remain on this plan and we will see him back in this clinic in 6 months or earlier if there is a problem.  Camellia Denis, MD Allergy / Immunology [facility]

## 2024-01-30 NOTE — Patient Instructions (Signed)
  1.  Continue to Treat and prevent inflammation of airway:   A.  Flonase  1 spray each nostril twice a day  B.  Azelastine  nasal spray 1 spray each nostril twice a day  C.  Montelukast  10 mg 1 tablet 1 time per day  D.  Breo 200 - 1 inhalation 1 time per day  E.  Immunotherapy  2. If needed:   A.  Cetirizine  10 mg 1 tablet 1-2 times a day  B.  Pataday  - 1 drop each eye 1 time per day  C.  Albuterol  2 puffs every 4-6 hours  3. Return to clinic in 6 months or earlier if problem  4. Influenza = Tamiflu. Covid = Paxlovid

## 2024-01-31 ENCOUNTER — Encounter: Payer: Self-pay | Admitting: Allergy and Immunology

## 2024-01-31 ENCOUNTER — Ambulatory Visit: Attending: Family Medicine | Admitting: Family Medicine

## 2024-01-31 VITALS — BP 145/84 | HR 84 | Wt 239.2 lb

## 2024-01-31 DIAGNOSIS — R21 Rash and other nonspecific skin eruption: Secondary | ICD-10-CM | POA: Diagnosis not present

## 2024-01-31 DIAGNOSIS — I10 Essential (primary) hypertension: Secondary | ICD-10-CM | POA: Diagnosis not present

## 2024-01-31 DIAGNOSIS — M5416 Radiculopathy, lumbar region: Secondary | ICD-10-CM | POA: Diagnosis not present

## 2024-01-31 DIAGNOSIS — M542 Cervicalgia: Secondary | ICD-10-CM | POA: Diagnosis not present

## 2024-01-31 DIAGNOSIS — R600 Localized edema: Secondary | ICD-10-CM

## 2024-01-31 DIAGNOSIS — J3089 Other allergic rhinitis: Secondary | ICD-10-CM | POA: Diagnosis not present

## 2024-01-31 DIAGNOSIS — E1169 Type 2 diabetes mellitus with other specified complication: Secondary | ICD-10-CM | POA: Diagnosis not present

## 2024-01-31 DIAGNOSIS — E1165 Type 2 diabetes mellitus with hyperglycemia: Secondary | ICD-10-CM | POA: Diagnosis not present

## 2024-01-31 DIAGNOSIS — I639 Cerebral infarction, unspecified: Secondary | ICD-10-CM | POA: Diagnosis not present

## 2024-01-31 DIAGNOSIS — E1159 Type 2 diabetes mellitus with other circulatory complications: Secondary | ICD-10-CM

## 2024-01-31 LAB — POCT GLYCOSYLATED HEMOGLOBIN (HGB A1C): HbA1c, POC (controlled diabetic range): 5.7 % (ref 0.0–7.0)

## 2024-01-31 LAB — GLUCOSE, POCT (MANUAL RESULT ENTRY): POC Glucose: 114 mg/dL — AB (ref 70–99)

## 2024-01-31 MED ORDER — DULOXETINE HCL 60 MG PO CPEP: 60.0000 mg | ORAL_CAPSULE | Freq: Every day | ORAL | 1 refills | Status: DC

## 2024-01-31 MED ORDER — VALSARTAN 80 MG PO TABS: 80.0000 mg | ORAL_TABLET | Freq: Every day | ORAL | 1 refills | Status: DC

## 2024-01-31 MED ORDER — FLUTICASONE PROPIONATE 50 MCG/ACT NA SUSP: 1.0000 | Freq: Two times a day (BID) | NASAL | 1 refills | Status: DC

## 2024-01-31 MED ORDER — FUROSEMIDE 20 MG PO TABS: 20.0000 mg | ORAL_TABLET | Freq: Every day | ORAL | 1 refills | Status: DC

## 2024-01-31 MED ORDER — AZELASTINE HCL 0.1 % NA SOLN: 1.0000 | Freq: Two times a day (BID) | NASAL | 1 refills | Status: AC

## 2024-01-31 MED ORDER — MONTELUKAST SODIUM 10 MG PO TABS: 10.0000 mg | ORAL_TABLET | Freq: Every day | ORAL | 1 refills | Status: DC

## 2024-01-31 MED ORDER — METHOCARBAMOL 500 MG PO TABS: 500.0000 mg | ORAL_TABLET | Freq: Three times a day (TID) | ORAL | 3 refills | Status: DC | PRN

## 2024-01-31 MED ORDER — FLUTICASONE FUROATE-VILANTEROL 200-25 MCG/ACT IN AEPB: 1.0000 | INHALATION_SPRAY | Freq: Every morning | RESPIRATORY_TRACT | 1 refills | Status: DC

## 2024-01-31 MED ORDER — ATORVASTATIN CALCIUM 40 MG PO TABS: 40.0000 mg | ORAL_TABLET | Freq: Every day | ORAL | 1 refills | Status: DC

## 2024-01-31 NOTE — Progress Notes (Signed)
 Subjective:  Patient ID: Jason Stokes, male    DOB: 26-Sep-1960  Age: 63 y.o. MRN: [id number]  CC: Medical Management of Chronic Issues and Nasal Congestion     Discussed the use of AI scribe software for clinical note transcription with the patient, who gave verbal consent to proceed.  History of Present Illness Jason Stokes is a 63 year old male with a history of  hypertension, type 2 diabetes mellitus, bipolar disorder, paroxysmal A. 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  He experiences persistent congestion for two months, worsened by heat exposure. His treatment includes Flonase , Singulair , Breo, cetirizine , Pataday , albuterol , and weekly immunotherapy injections, with temporary relief from nasal irrigation.  He did see his allergist yesterday and states he was not prescribed any additional medications for his symptoms.  He has reduced smoking due to congestion. Significant weight loss of 20 pounds is noted, which he attributes to frequent spitting and coughing. Sputum is clear, but excessive hawking causes bloodshot eyes and migraines per patient.  He has skin breakouts with bumps on the buttock legs, back, chest, and face. Lesions exude pus and turn white before bursting. He awaits a dermatology appointment in September.  He experiences muscle spasms and cramps in hands and legs, severe enough to interrupt driving. He takes Cymbalta  and a muscle relaxant. Potassium levels are normal. His diabetes is well-managed with diet with an A1c of 5.7. His blood pressure is elevated and he attributes this to being in pain in his back.  He was previously followed by [facility] pain management but was discharged due to cannabis use.    Past Medical History:  Diagnosis Date   Anemia     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)    a.) s/p embolization 08/19/2020: 29 mm RIGHT internal iliac artery aneurysm   Anxiety    Aortic atherosclerosis (HCC)    Bilateral carpal tunnel syndrome 01/10/2018   Bipolar disorder (HCC)    Chorioretinal inflammation of both eyes    a.) on azothioprine   Chronic lower back pain    Coronary artery calcification seen on CT scan    a.) cCTA 04/21/2022: Ca score 20.6 (61st percentile for age/sex match control)   DDD (degenerative disc disease), cervical    Depression    Diastolic dysfunction    a.) TTE 01/13/2021: EF 60-65%, mod LVH, triv MR, G1DD   GERD (gastroesophageal reflux disease)    Hepatic steatosis    History of alcohol abuse    History of nuclear stress test    Myoview 10/16: EF 50%, diaphragmatic attenuation, no ischemia, low risk   Hypertension    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   a.) CT head 12/25/2012 --> RIGHT basal ganglia hypoattenuation related to remote lacunar infarct   Lipoma    Long term (current) use of anticoagulants    a.) apixaban    Long-term current use of immunomodulator    a.) on azothioprine for peripheral focal chorioretinal inflammation (both eyes)   Marijuana use    Migraine    Moderate persistent asthma with acute exacerbation 05/02/2018   PAF (paroxysmal atrial fibrillation) (HCC) 03/27/2015   a.) CHA2DS2VASc = 5 (HTN, CVA x2, vascular disease history, T2DM);  b.) s/p ablation 10/02/2015; c.) s/p ablation 12/10/2015; d.) s/p DCCV (200 J x 1) 12/11/2015; e.) s/p ablation 04/28/2022; f.) rate/rhythm maintained  on oral diltiazem  + carvedilol ; chronically anticoagulated with apixaban    Pilonidal cyst    Schizophrenia (HCC)    Sciatica neuralgia    T2DM (type 2 diabetes mellitus) (HCC)    Thoracic aortic ectasia (HCC) 01/13/2021   a.) TTE 01/13/2021: Ao root 38 mm, asc Ao 39 mm    Past Surgical History:  Procedure Laterality Date   ATRIAL FIBRILLATION ABLATION  09/22/2015   ATRIAL  FIBRILLATION ABLATION N/A 04/28/2022   Procedure: ATRIAL FIBRILLATION ABLATION;  Surgeon: Inocencio Soyla Lunger, MD;  [facility]: [facility];  Service: Cardiovascular;  Laterality: N/A;   CATARACT EXTRACTION Bilateral    CYST EXCISION  1996-97   surgery back of head    ELECTROPHYSIOLOGIC STUDY N/A 09/22/2015   Procedure: Atrial Fibrillation Ablation;  Surgeon: Will Lunger Inocencio, MD;  Location: [facility];  Service: Cardiovascular;  Laterality: N/A;   ELECTROPHYSIOLOGIC STUDY N/A 12/10/2015   Procedure: Atrial Fibrillation Ablation;  Surgeon: Will Lunger Inocencio, MD;  Location: [facility];  Service: Cardiovascular;  Laterality: N/A;   ELECTROPHYSIOLOGIC STUDY N/A 12/11/2015   Procedure: Cardioversion;  Surgeon: Will Lunger Inocencio, MD;  Location: [facility];  Service: Cardiovascular;  Laterality: N/A;   EMBOLIZATION (CATH LAB) Right 08/19/2020   Procedure: EMBOLIZATION;  Surgeon: Magda Debby SAILOR, MD;  Location: [facility];  Service: Cardiovascular;  Laterality: Right;  hypogastric   EXCISION MASS HEAD N/A 01/06/2017   Procedure: EXCISION MASS FOREHEAD;  Surgeon: Arelia Filippo, MD;  Location: [facility];  Service: Plastics;  Laterality: N/A;   EXCISION MASS UPPER EXTREMETIES Right 08/08/2022   Procedure: EXCISION MASS RIGHT FOREARM;  Surgeon: Murrell Drivers, MD;  Location: [facility];  Service: Orthopedics;  Laterality: Right;  45 MIN   GANGLION CYST EXCISION Left    INTERCOSTAL NERVE BLOCK  07/19/2003   KNEE ARTHROSCOPY Right 07/18/2014   MASS EXCISION N/A 01/25/2021   Procedure: EXCISION SUBCUTANEOUS VS SUBFASCIAL MASS TORSO 3CM;  Surgeon: Arelia Filippo, MD;  Location: [facility];  Service: Plastics;  Laterality: N/A;   PILONIDAL CYST EXCISION N/A 09/13/2022   Procedure: CYST EXCISION PILONIDAL EXTENSIVE;  Surgeon: Desiderio Schanz, MD;  Location: [facility];  Service: General;  Laterality: N/A;   SHOULDER  ARTHROSCOPY WITH ROTATOR CUFF REPAIR AND OPEN BICEPS TENODESIS Left 07/26/2023   Procedure: LEFT SHOULDER ARTHROSCOPY WITH OPEN ROTATOR CUFF REPAIR AND OPEN BICEPS TENODESIS;  Surgeon: Barbarann Oneil BROCKS, MD;  Location: [facility];  Service: Orthopedics;  Laterality: Left;    Family History  Problem Relation Age of Onset   Heart disease Father    Schizophrenia Sister     Social History   Socioeconomic History   Marital status: Single    Spouse name: Orlean   Number of children: 3   Years of education: HS   Highest education level: 11th grade  Occupational History    Comment: disabled  Tobacco Use   Smoking status: Some Days    Current packs/day: 0.00    Types: Cigarettes    Start date: 09/10/1993    Last attempt to quit: 09/10/2022    Years since quitting: 1.3    Passive exposure: Never   Smokeless tobacco: Never   Tobacco comments:    1 cigarettes every day  06-02-2021  Vaping Use   Vaping status: Never Used  Substance and Sexual Activity   Alcohol use: Yes    Alcohol/week: 6.0 standard drinks of alcohol  Types: 6 Cans of beer per week    Comment: occasional   Drug use: Yes    Types: Marijuana    Comment: been 1 week   Sexual activity: Not Currently  Other Topics Concern   Not on file  Social History Narrative   Patient lives at home with [place].    Patient has 3 children 2 step.    Patient has 13 years of schooling.    Patient is right handed.    Social Drivers of Corporate investment banker Strain: Low Risk  (11/15/2022)   Overall Financial Resource Strain (CARDIA)    Difficulty of Paying Living Expenses: Not hard at all  Food Insecurity: Food Insecurity Present (11/15/2022)   Hunger Vital Sign    Worried About Running Out of Food in the Last Year: Never true    Ran Out of Food in the Last Year: Sometimes true  Transportation Needs: No Transportation Needs (11/15/2022)   PRAPARE - Administrator, Civil Service (Medical): No    Lack of  Transportation (Non-Medical): No  Physical Activity: Unknown (11/15/2022)   Exercise Vital Sign    Days of Exercise per Week: 0 days    Minutes of Exercise per Session: Not on file  Stress: No Stress Concern Present (11/15/2022)   [employer] of Occupational Health - Occupational Stress Questionnaire    Feeling of Stress : Not at all  Social Connections: Socially Isolated (11/15/2022)   Social Connection and Isolation Panel    Frequency of Communication with Friends and Family: Twice a week    Frequency of Social Gatherings with Friends and Family: Once a week    Attends Religious Services: Never    Database administrator or Organizations: No    Attends Engineer, structural: Not on file    Marital Status: Never married    Allergies  Allergen Reactions   Lisinopril Anaphylaxis    Swelling of lips and tongue.   Tylenol  [Acetaminophen ] Other (See Comments)    Acid reflux    Lyrica  [Pregabalin ] Palpitations    Outpatient Medications Prior to Visit  Medication Sig Dispense Refill   Accu-Chek Softclix Lancets lancets Use as instructed 100 each 12   acetaminophen -codeine  (TYLENOL  #3) 300-30 MG tablet 1 po q 8hrs prn pain 30 tablet 0   albuterol  (VENTOLIN  HFA) 108 (90 Base) MCG/ACT inhaler INHALE 2 PUFFS INTO THE LUNGS EVERY 4 HOURS AS NEEDED FOR WHEEZING OR SHORTNESS OF BREATH. 18 each 0   azaTHIOprine (IMURAN) 50 MG tablet Take 100 mg by mouth every morning.     azelastine  (ASTELIN ) 0.1 % nasal spray Place 1 spray into both nostrils 2 (two) times daily. 1 spray each nostril twice a day 90 mL 1   brimonidine (ALPHAGAN) 0.2 % ophthalmic solution Place 1 drop into both eyes every 8 (eight) hours.     carvedilol  (COREG ) 25 MG tablet Take 1 tablet (25 mg total) by mouth 2 (two) times daily. 180 tablet 2   cetirizine  (ZYRTEC ) 10 MG tablet Take 1 tablet (10 mg total) by mouth daily as needed for allergies (Can take an extra dose during flare ups.). 180 tablet 1   cromolyn   (OPTICROM ) 4 % ophthalmic solution Place 1 drop into both eyes 4 (four) times daily as needed. 10 mL 5   Difluprednate 0.05 % EMUL Place 1 drop into both eyes 2 (two) times daily.     diltiazem  (CARDIZEM  CD) 180 MG 24 hr capsule TAKE 1 CAPSULE  BY MOUTH EVERY DAY 90 capsule 2   dorzolamide-timolol (COSOPT) 22.3-6.8 MG/ML ophthalmic solution Place 1 drop into both eyes 2 (two) times daily.     ELIQUIS  5 MG TABS tablet TAKE 1 TABLET BY MOUTH TWICE A DAY 60 tablet 5   EPINEPHrine  0.3 mg/0.3 mL IJ SOAJ injection Inject 0.3 mg into the muscle as needed for anaphylaxis. 0.3 mL 1   fluticasone  (FLONASE ) 50 MCG/ACT nasal spray Place 1 spray into both nostrils 2 (two) times daily. 1 spray each nostril twice a day 48 g 1   fluticasone  furoate-vilanterol (BREO ELLIPTA ) 200-25 MCG/ACT AEPB Inhale 1 puff into the lungs in the morning. 84 each 1   folic acid (FOLVITE) 1 MG tablet Take 1 mg by mouth daily.     GARLIC PO Take 1 tablet by mouth daily.     glucose blood (ACCU-CHEK GUIDE TEST) test strip USE TO CHECK BLOOD SUGAR ONCE DAILY. E11.69 100 strip 0   ipratropium (ATROVENT ) 0.03 % nasal spray Place 2 sprays into both nostrils 4 (four) times daily as needed for rhinitis. 30 mL 5   latanoprost (XALATAN) 0.005 % ophthalmic solution Place 1 drop into both eyes daily.     Misc. Devices MISC Back brace. Diagnosis chronic back pain 1 each 0   Misc. Devices MISC Left knee brace. Diagnosis L knee pain 1 each 0   mometasone-formoterol (DULERA ) 200-5 MCG/ACT AERO Inhale 2 puffs into the lungs 2 (two) times daily. 39 g 1   montelukast  (SINGULAIR ) 10 MG tablet Take 1 tablet (10 mg total) by mouth at bedtime. 90 tablet 1   Olopatadine  HCl (PATADAY ) 0.2 % SOLN Place 1 drop into both eyes daily as needed. 7.5 mL 1   omeprazole  (PRILOSEC) 20 MG capsule TAKE 1 CAPSULE BY MOUTH EVERY DAY 90 capsule 0   oxyCODONE -acetaminophen  (PERCOCET) 5-325 MG tablet Take 1-2 tablets by mouth every 6 (six) hours as needed for severe pain  (pain score 7-10). 20 tablet 0   Vitamin D3 (VITAMIN D) 25 MCG tablet Take 1,000 Units by mouth daily.     atorvastatin  (LIPITOR) 40 MG tablet Take 1 tablet (40 mg total) by mouth daily. 90 tablet 1   DULoxetine  (CYMBALTA ) 60 MG capsule TAKE 1 CAPSULE (60 MG TOTAL) BY MOUTH DAILY. FOR CHRONIC PAIN AND NEUROPATHY 90 capsule 1   furosemide  (LASIX ) 20 MG tablet TAKE 1 TABLET BY MOUTH EVERY DAY 90 tablet 1   methocarbamol  (ROBAXIN ) 500 MG tablet Take 1 tablet (500 mg total) by mouth every 8 (eight) hours as needed for muscle spasms. 90 tablet 3   valsartan  (DIOVAN ) 80 MG tablet TAKE 1 TABLET BY MOUTH EVERY DAY 90 tablet 1   No facility-administered medications prior to visit.     ROS Review of Systems  Constitutional:  Negative for activity change and appetite change.  HENT:  Positive for congestion. Negative for sinus pressure and sore throat.   Respiratory:  Negative for chest tightness, shortness of breath and wheezing.   Cardiovascular:  Negative for chest pain and palpitations.  Gastrointestinal:  Negative for abdominal distention, abdominal pain and constipation.  Genitourinary: Negative.   Musculoskeletal:  Positive for back pain.  Skin:  Positive for rash.  Psychiatric/Behavioral:  Negative for behavioral problems and dysphoric mood.     Objective:  BP (!) 145/84 (BP Location: Left Arm, Cuff Size: Large)   Pulse 84   Wt 239 lb 3.2 oz (108.5 kg)   SpO2 98%   BMI 31.56 kg/m  01/31/2024    2:49 PM 01/31/2024    2:28 PM 01/30/2024    9:51 AM  BP/Weight  Systolic BP 145 150 130  Diastolic BP 84 95 88  Wt. (Lbs)  239.2   BMI  31.56 kg/m2       Physical Exam Constitutional:      Appearance: He is well-developed.  HENT:     Head:     Comments: No sinus tenderness bilaterally    Right Ear: Tympanic membrane normal.     Left Ear: Tympanic membrane normal.     Mouth/Throat:     Mouth: Mucous membranes are moist.  Cardiovascular:     Rate and Rhythm: Normal rate.      Heart sounds: Normal heart sounds. No murmur heard. Pulmonary:     Effort: Pulmonary effort is normal.     Breath sounds: Normal breath sounds. No wheezing or rales.  Chest:     Chest wall: No tenderness.  Abdominal:     General: Bowel sounds are normal. There is no distension.     Palpations: Abdomen is soft. There is no mass.     Tenderness: There is no abdominal tenderness.  Musculoskeletal:     Right lower leg: No edema.     Left lower leg: No edema.     Comments: Positive straight leg raise on the right, negative on the left  Skin:    Comments: Nodules at different stages of healing in extremities.  No punctum, no discharge  Neurological:     Mental Status: He is alert and oriented to person, place, and time.  Psychiatric:        Mood and Affect: Mood normal.        Latest Ref Rng & Units 07/24/2023    3:00 PM 06/23/2023    5:41 PM 06/19/2023   11:37 AM  CMP  Glucose 70 - 99 mg/dL 879  870    BUN 8 - 23 mg/dL 8  13    Creatinine 9.38 - 1.24 mg/dL 8.88  8.85    Sodium 864 - 145 mmol/L 139  139    Potassium 3.5 - 5.1 mmol/L 4.1  4.3  4.1   Chloride 98 - 111 mmol/L 106  104    CO2 22 - 32 mmol/L 25  26    Calcium  8.9 - 10.3 mg/dL 8.9  9.0      Lipid Panel     Component Value Date/Time   CHOL 148 05/24/2023 1109   TRIG 159 (H) 05/24/2023 1109   HDL 42 05/24/2023 1109   CHOLHDL 5.2 (H) 05/07/2018 1004   CHOLHDL 5.4 10/17/2013 1042   VLDL 36 10/17/2013 1042   LDLCALC 79 05/24/2023 1109    CBC    Component Value Date/Time   WBC 4.5 08/03/2023 1209   WBC 5.0 09/06/2022 1243   RBC 4.84 08/03/2023 1209   RBC 5.22 09/06/2022 1243   HGB 11.5 (L) 08/03/2023 1209   HCT 37.1 (L) 08/03/2023 1209   PLT 247 08/03/2023 1209   MCV 77 (L) 08/03/2023 1209   MCH 23.8 (L) 08/03/2023 1209   MCH 23.0 (L) 09/06/2022 1243   MCHC 31.0 (L) 08/03/2023 1209   MCHC 30.7 09/06/2022 1243   RDW 17.5 (H) 08/03/2023 1209   LYMPHSABS 1.3 08/03/2023 1209   MONOABS 0.7 08/08/2019 1430    EOSABS 0.1 08/03/2023 1209   BASOSABS 0.0 08/03/2023 1209    Lab Results  Component Value Date   HGBA1C 5.7 01/31/2024  1. Type 2 diabetes mellitus with other specified complication, without long-term current use of insulin (HCC) (Primary) Diet controlled with A1c of 5.7 Counseled on Diabetic diet, the healthy plate, 849 minutes of moderate intensity exercise/week Blood sugar logs with fasting goals of 80-120 mg/dl, random of less than 819 and in the event of sugars less than 60 mg/dl or greater than 599 mg/dl encouraged to notify the clinic. Advised on the need for annual eye exams, annual foot exams, Pneumonia vaccine. - POCT glycosylated hemoglobin (Hb A1C) - POCT glucose (manual entry) - atorvastatin  (LIPITOR) 40 MG tablet; Take 1 tablet (40 mg total) by mouth daily.  Dispense: 90 tablet; Refill: 1 - CMP14+EGFR  2. Lumbar radiculopathy Uncontrolled Previously followed by [facility] but discharged due to cannabis use - DULoxetine  (CYMBALTA ) 60 MG capsule; Take 1 capsule (60 mg total) by mouth daily. For chronic pain and neuropathy  Dispense: 90 capsule; Refill: 1 - methocarbamol  (ROBAXIN ) 500 MG tablet; Take 1 tablet (500 mg total) by mouth every 8 (eight) hours as needed for muscle spasms.  Dispense: 90 tablet; Refill: 3  3. Neck pain Uncontrolled See #2 above - DULoxetine  (CYMBALTA ) 60 MG capsule; Take 1 capsule (60 mg total) by mouth daily. For chronic pain and neuropathy  Dispense: 90 capsule; Refill: 1 - methocarbamol  (ROBAXIN ) 500 MG tablet; Take 1 tablet (500 mg total) by mouth every 8 (eight) hours as needed for muscle spasms.  Dispense: 90 tablet; Refill: 3  4. Pedal edema Stable Encouraged to comply with a low-sodium diet, elevate feet, use compression stockings - furosemide  (LASIX ) 20 MG tablet; Take 1 tablet (20 mg total) by mouth daily.  Dispense: 90 tablet; Refill: 1  5. Hypertension associated with diabetes (HCC) Uncontrolled He attributes  elevation to pain Continue current antihypertensives - valsartan  (DIOVAN ) 80 MG tablet; Take 1 tablet (80 mg total) by mouth daily.  Dispense: 90 tablet; Refill: 1  6. Skin rash No evidence of infection Has an upcoming appointment with dermatology Advised to call dermatology clinic for an earlier appointment in case somebody cancels  7. Allergic rhinitis due to other allergic trigger, unspecified seasonality Intermittent flares Currently on immunotherapy Continue with nasal sprays and antihistamines Continue follow-up with allergy and immunology.   Meds ordered this encounter  Medications   atorvastatin  (LIPITOR) 40 MG tablet    Sig: Take 1 tablet (40 mg total) by mouth daily.    Dispense:  90 tablet    Refill:  1   DULoxetine  (CYMBALTA ) 60 MG capsule    Sig: Take 1 capsule (60 mg total) by mouth daily. For chronic pain and neuropathy    Dispense:  90 capsule    Refill:  1   furosemide  (LASIX ) 20 MG tablet    Sig: Take 1 tablet (20 mg total) by mouth daily.    Dispense:  90 tablet    Refill:  1   methocarbamol  (ROBAXIN ) 500 MG tablet    Sig: Take 1 tablet (500 mg total) by mouth every 8 (eight) hours as needed for muscle spasms.    Dispense:  90 tablet    Refill:  3   valsartan  (DIOVAN ) 80 MG tablet    Sig: Take 1 tablet (80 mg total) by mouth daily.    Dispense:  90 tablet    Refill:  1    Follow-up: Return in about 6 months (around 08/02/2024) for Chronic medical conditions.       Corrina Sabin, MD, FAAFP. [facility]  [place], [place] [phone]   01/31/2024, 5:42 PM

## 2024-01-31 NOTE — Patient Instructions (Signed)
 [facility]  [address], [place], [place] [address] Ph# [phone] Fax [phone]

## 2024-02-01 ENCOUNTER — Ambulatory Visit: Payer: Self-pay | Admitting: Family Medicine

## 2024-02-01 DIAGNOSIS — I639 Cerebral infarction, unspecified: Secondary | ICD-10-CM | POA: Diagnosis not present

## 2024-02-01 LAB — CMP14+EGFR
ALT: 10 IU/L (ref 0–44)
AST: 12 IU/L (ref 0–40)
Albumin: 3.9 g/dL (ref 3.9–4.9)
Alkaline Phosphatase: 117 IU/L (ref 44–121)
BUN/Creatinine Ratio: 9 — ABNORMAL LOW (ref 10–24)
BUN: 9 mg/dL (ref 8–27)
Bilirubin Total: 0.3 mg/dL (ref 0.0–1.2)
CO2: 22 mmol/L (ref 20–29)
Calcium: 9 mg/dL (ref 8.6–10.2)
Chloride: 106 mmol/L (ref 96–106)
Creatinine, Ser: 1.01 mg/dL (ref 0.76–1.27)
Globulin, Total: 3 g/dL (ref 1.5–4.5)
Glucose: 90 mg/dL (ref 70–99)
Potassium: 4 mmol/L (ref 3.5–5.2)
Sodium: 141 mmol/L (ref 134–144)
Total Protein: 6.9 g/dL (ref 6.0–8.5)
eGFR: 84 mL/min/1.73 (ref >59)

## 2024-02-02 DIAGNOSIS — I639 Cerebral infarction, unspecified: Secondary | ICD-10-CM | POA: Diagnosis not present

## 2024-02-03 DIAGNOSIS — I639 Cerebral infarction, unspecified: Secondary | ICD-10-CM | POA: Diagnosis not present

## 2024-02-04 DIAGNOSIS — I639 Cerebral infarction, unspecified: Secondary | ICD-10-CM | POA: Diagnosis not present

## 2024-02-05 DIAGNOSIS — I639 Cerebral infarction, unspecified: Secondary | ICD-10-CM | POA: Diagnosis not present

## 2024-02-06 DIAGNOSIS — I639 Cerebral infarction, unspecified: Secondary | ICD-10-CM | POA: Diagnosis not present

## 2024-02-07 ENCOUNTER — Ambulatory Visit (INDEPENDENT_AMBULATORY_CARE_PROVIDER_SITE_OTHER): Admitting: Orthopedic Surgery

## 2024-02-07 ENCOUNTER — Encounter: Payer: Self-pay | Admitting: Orthopedic Surgery

## 2024-02-07 DIAGNOSIS — M75121 Complete rotator cuff tear or rupture of right shoulder, not specified as traumatic: Secondary | ICD-10-CM

## 2024-02-07 DIAGNOSIS — M7521 Bicipital tendinitis, right shoulder: Secondary | ICD-10-CM

## 2024-02-07 DIAGNOSIS — I639 Cerebral infarction, unspecified: Secondary | ICD-10-CM | POA: Diagnosis not present

## 2024-02-07 NOTE — Progress Notes (Unsigned)
 Office Visit Note   [name]           Date of Birth: 07/21/1960           MRN: [id number] Visit Date: 02/07/2024 Requested by: Delbert Clam, MD [address] [place],  [place] [address] PCP: Delbert Clam, MD  Subjective: Chief Complaint  Patient presents with   Right Shoulder - Follow-up    HPI: Jason Stokes is a 63 y.o. male who presents to the office reporting several month history of right shoulder pain.  Overall he feels worse compared to last visit.  Cannot really lay on the right-hand side.  Has a history of left shoulder rotator cuff tear repair but notes the supraspinatus.  Currently not working.  MRI scan performed on [name] is reviewed today.  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  Also has biceps tendinopathy and partial tearing along with a full-thickness retracted tear of the subscap.  The retraction amount is about 1 to 1-1/2 cm.  Patient is a smoker.  He did try physical therapy but could not do it on the shoulder because of pain..                ROS: All systems reviewed are negative as they relate to the chief complaint within the history of present illness.  Patient denies fevers or chills.  Assessment & Plan: Visit Diagnoses: No diagnosis found.  Plan: Impression is right shoulder rotator cuff tear and biceps tendinitis and tendinopathy.  Does have a little acromial spurring as well. He has significant AC joint arthritis on the scan is not particularly clinically symptomatic.  Plan at this time is right shoulder arthroscopy with debridement of the superior labrum which has some degenerative changes as well in the biceps tendon.  Mini open biceps tenodesis and rotator cuff tear repair of the subscapularis.  Because he is a smoker we may use a patch of dermal allograft.  The risk and benefits of surgery are discussed with the patient including allergies to infection nerve or vessel damage rotator cuff retear,  shoulder stiffness, prolonged recovery.  Patient understands risk benefits and wishes to proceed.  All questions answered  Follow-Up Instructions: No follow-ups on file.   Orders:  No orders of the defined types were placed in this encounter.  No orders of the defined types were placed in this encounter.     Procedures: No procedures performed   Clinical Data: No additional findings.  Objective: Vital Signs: There were no vitals taken for this visit.  Physical Exam:  Constitutional: Patient appears well-developed HEENT:  Head: Normocephalic Eyes:EOM are normal Neck: Normal range of motion Cardiovascular: Normal rate Pulmonary/chest: Effort normal Neurologic: Patient is alert Skin: Skin is warm Psychiatric: Patient has normal mood and affect  Ortho Exam: Ortho exam demonstrates good cervical spine range of motion.  Does have subscap weakness on the right at 4-5 compared to the left 5+ out of 5.  External rotation strength 5+ out of 5 bilaterally.  No discrete AC joint tenderness right versus left.  Does have a little bit of popping with internal/external Tatian of the right side.  Motor and sensory function of the hands intact.  No Popeye deformity is present.  O'Brien's testing is positive on the right negative on the.  Does have bicipital groove tenderness on the right compared to the left.  Passive range of motion on the right is 70/90/165.  Patient  is able to achieve forward flexion and abduction on both sides both above 90 degrees.  Specialty Comments:  CLINICAL DATA:  Chronic neck pain   EXAM: CT CERVICAL SPINE WITHOUT CONTRAST   TECHNIQUE: Multidetector CT imaging of the cervical spine was performed without intravenous contrast. Multiplanar CT image reconstructions were also generated.   RADIATION DOSE REDUCTION: This exam was performed according to the departmental dose-optimization program which includes automated exposure control, adjustment of the mA and/or  kV according to patient size and/or use of iterative reconstruction technique.   COMPARISON:  07/16/2022, 05/15/2022   FINDINGS: Alignment: Facet joints are aligned without dislocation or traumatic listhesis. Dens and lateral masses are aligned.   Skull base and vertebrae: No acute fracture. No primary bone lesion or focal pathologic process.   Soft tissues and spinal canal: No prevertebral fluid or swelling. No visible canal hematoma.   Disc levels: Degenerative disc disease most pronounced at the C3-4 and C5-6 levels, without interval progression from recent previous CT and MRI.   Upper chest: Negative.   Other: None.   IMPRESSION: 1. No acute fracture or traumatic listhesis of the cervical spine. 2. 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     Electronically Signed   By: Mabel Converse D.O.   On: 07/27/2022 14:03  Imaging: No results found.   PMFS History: Patient Active Problem List   Diagnosis Date Noted   Impingement syndrome of right shoulder 10/03/2023   Tendinitis of upper biceps tendon of left shoulder 06/12/2023   Complete tear of left rotator cuff 10/28/2022   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   Pilonidal cyst 09/13/2022   Aortic atherosclerosis (HCC) 06/07/2022   Hypercoagulable state due to paroxysmal atrial fibrillation (HCC) 03/23/2021   Type 2 diabetes mellitus with hyperglycemia, without long-term current use of insulin (HCC) 02/10/2021   Neck pain on left side 03/06/2019   Musculoskeletal chest pain 07/24/2018   Asthma, mild intermittent 05/02/2018   Allergic rhinitis caused by mold 05/02/2018   Tobacco use 05/02/2018   Bilateral carpal tunnel syndrome 01/10/2018   Stroke (HCC)    Schizophrenia (HCC)    Migraine    History of nuclear stress test    History of alcohol abuse    GERD (gastroesophageal reflux disease)    Dysrhythmia     Chronic lower back pain    Anxiety    Arthritis of knee, right 04/12/2017   Bipolar disorder (HCC) 04/03/2017   Lipoma of forehead 09/22/2016   Trigger ring finger of right hand 06/22/2016   Paroxysmal atrial fibrillation (HCC)    AF (atrial fibrillation) (HCC) 12/10/2015   Eczema 07/10/2015   History of CVA (cerebrovascular accident) 05/04/2015   Tear of medial meniscus of right knee 03/18/2015   Chronic pain of right knee 03/12/2015   Other and unspecified hyperlipidemia 11/25/2013   Nasal congestion 11/25/2013   Sinusitis, chronic 05/09/2013   Chronic low back pain 10/12/2012   Lumbar radiculopathy 10/12/2012   Hypertension associated with diabetes (HCC) 10/12/2012   Depression 10/12/2012   Insomnia 10/12/2012   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)    a.) s/p embolization 08/19/2020: 29 mm RIGHT internal iliac artery aneurysm   Anxiety    Aortic atherosclerosis (HCC)    Bilateral carpal tunnel syndrome 01/10/2018   Bipolar disorder (HCC)    Chorioretinal inflammation of  both eyes    a.) on azothioprine   Chronic lower back pain    Coronary artery calcification seen on CT scan    a.) cCTA 04/21/2022: Ca score 20.6 (61st percentile for age/sex match control)   DDD (degenerative disc disease), cervical    Depression    Diastolic dysfunction    a.) TTE 01/13/2021: EF 60-65%, mod LVH, triv MR, G1DD   GERD (gastroesophageal reflux disease)    Hepatic steatosis    History of alcohol abuse    History of nuclear stress test    Myoview 10/16: EF 50%, diaphragmatic attenuation, no ischemia, low risk   Hypertension    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   a.) CT head 12/25/2012 --> RIGHT basal ganglia hypoattenuation related to remote lacunar infarct   Lipoma    Long term (current) use of anticoagulants    a.) apixaban    Long-term current use of immunomodulator    a.) on azothioprine for peripheral focal  chorioretinal inflammation (both eyes)   Marijuana use    Migraine    Moderate persistent asthma with acute exacerbation 05/02/2018   PAF (paroxysmal atrial fibrillation) (HCC) 03/27/2015   a.) CHA2DS2VASc = 5 (HTN, CVA x2, vascular disease history, T2DM);  b.) s/p ablation 10/02/2015; c.) s/p ablation 12/10/2015; d.) s/p DCCV (200 J x 1) 12/11/2015; e.) s/p ablation 04/28/2022; f.) rate/rhythm maintained on oral diltiazem  + carvedilol ; chronically anticoagulated with apixaban    Pilonidal cyst    Schizophrenia (HCC)    Sciatica neuralgia    T2DM (type 2 diabetes mellitus) (HCC)    Thoracic aortic ectasia (HCC) 01/13/2021   a.) TTE 01/13/2021: Ao root 38 mm, asc Ao 39 mm    Family History  Problem Relation Age of Onset   Heart disease Father    Schizophrenia Sister     Past Surgical History:  Procedure Laterality Date   ATRIAL FIBRILLATION ABLATION  09/22/2015   ATRIAL FIBRILLATION ABLATION N/A 04/28/2022   Procedure: ATRIAL FIBRILLATION ABLATION;  Surgeon: Inocencio Soyla Lunger, MD;  Location: [facility];  Service: Cardiovascular;  Laterality: N/A;   CATARACT EXTRACTION Bilateral    CYST EXCISION  1996-97   surgery back of head    ELECTROPHYSIOLOGIC STUDY N/A 09/22/2015   Procedure: Atrial Fibrillation Ablation;  Surgeon: Will Lunger Inocencio, MD;  Location: [facility];  Service: Cardiovascular;  Laterality: N/A;   ELECTROPHYSIOLOGIC STUDY N/A 12/10/2015   Procedure: Atrial Fibrillation Ablation;  Surgeon: Will Lunger Inocencio, MD;  Location: [facility];  Service: Cardiovascular;  Laterality: N/A;   ELECTROPHYSIOLOGIC STUDY N/A 12/11/2015   Procedure: Cardioversion;  Surgeon: Will Lunger Inocencio, MD;  Location: [facility];  Service: Cardiovascular;  Laterality: N/A;   EMBOLIZATION (CATH LAB) Right 08/19/2020   Procedure: EMBOLIZATION;  Surgeon: Magda Debby SAILOR, MD;  Location: [facility];  Service: Cardiovascular;  Laterality: Right;  hypogastric    EXCISION MASS HEAD N/A 01/06/2017   Procedure: EXCISION MASS FOREHEAD;  Surgeon: Arelia Filippo, MD;  Location: [facility];  Service: Plastics;  Laterality: N/A;   EXCISION MASS UPPER EXTREMETIES Right 08/08/2022   Procedure: EXCISION MASS RIGHT FOREARM;  Surgeon: Murrell Drivers, MD;  Location: [facility];  Service: Orthopedics;  Laterality: Right;  45 MIN   GANGLION CYST EXCISION Left    INTERCOSTAL NERVE BLOCK  07/19/2003   KNEE ARTHROSCOPY Right 07/18/2014   MASS EXCISION N/A 01/25/2021   Procedure: EXCISION SUBCUTANEOUS VS SUBFASCIAL MASS TORSO 3CM;  Surgeon: Thimmappa,  Earlis, MD;  Location: [facility];  Service: Plastics;  Laterality: N/A;   PILONIDAL CYST EXCISION N/A 09/13/2022   Procedure: CYST EXCISION PILONIDAL EXTENSIVE;  Surgeon: Desiderio Schanz, MD;  Location: [facility];  Service: General;  Laterality: N/A;   SHOULDER ARTHROSCOPY WITH ROTATOR CUFF REPAIR AND OPEN BICEPS TENODESIS Left 07/26/2023   Procedure: LEFT SHOULDER ARTHROSCOPY WITH OPEN ROTATOR CUFF REPAIR AND OPEN BICEPS TENODESIS;  Surgeon: Barbarann Oneil BROCKS, MD;  Location:  SURGERY CENTER;  Service: Orthopedics;  Laterality: Left;   Social History   Occupational History    Comment: disabled  Tobacco Use   Smoking status: Some Days    Current packs/day: 0.00    Types: Cigarettes    Start date: 09/10/1993    Last attempt to quit: 09/10/2022    Years since quitting: 1.4    Passive exposure: Never   Smokeless tobacco: Never   Tobacco comments:    1 cigarettes every day  06-02-2021  Vaping Use   Vaping status: Never Used  Substance and Sexual Activity   Alcohol use: Yes    Alcohol/week: 6.0 standard drinks of alcohol    Types: 6 Cans of beer per week    Comment: occasional   Drug use: Yes    Types: Marijuana    Comment: been 1 week   Sexual activity: Not Currently

## 2024-02-08 ENCOUNTER — Ambulatory Visit (INDEPENDENT_AMBULATORY_CARE_PROVIDER_SITE_OTHER)

## 2024-02-08 DIAGNOSIS — I639 Cerebral infarction, unspecified: Secondary | ICD-10-CM | POA: Diagnosis not present

## 2024-02-08 DIAGNOSIS — J309 Allergic rhinitis, unspecified: Secondary | ICD-10-CM | POA: Diagnosis not present

## 2024-02-09 DIAGNOSIS — I639 Cerebral infarction, unspecified: Secondary | ICD-10-CM | POA: Diagnosis not present

## 2024-02-10 DIAGNOSIS — I639 Cerebral infarction, unspecified: Secondary | ICD-10-CM | POA: Diagnosis not present

## 2024-02-11 DIAGNOSIS — I639 Cerebral infarction, unspecified: Secondary | ICD-10-CM | POA: Diagnosis not present

## 2024-02-12 ENCOUNTER — Other Ambulatory Visit: Payer: Self-pay | Admitting: Allergy and Immunology

## 2024-02-12 DIAGNOSIS — I639 Cerebral infarction, unspecified: Secondary | ICD-10-CM | POA: Diagnosis not present

## 2024-02-13 DIAGNOSIS — I639 Cerebral infarction, unspecified: Secondary | ICD-10-CM | POA: Diagnosis not present

## 2024-02-14 ENCOUNTER — Telehealth: Payer: Self-pay

## 2024-02-14 DIAGNOSIS — I639 Cerebral infarction, unspecified: Secondary | ICD-10-CM | POA: Diagnosis not present

## 2024-02-14 NOTE — Telephone Encounter (Signed)
   Pre-operative Risk Assessment    Patient Name: Jason Stokes  DOB: 02-08-1961 MRN: [id number]   Date of last office visit: 09/22/23 Jason PASSEY, PA-C Date of next office visit: NONE   Request for Surgical Clearance    Procedure:  RIGHT SHOULDER ARTHROPLASTY SUBSCAPULARIS REPAIR  Date of Surgery:  Clearance TBD                                Surgeon:  NOT INDICATED Surgeon's Group or Practice Name:  [facility] Phone number:  [phone] Fax number:  [phone]   Type of Clearance Requested:   - Medical  - Pharmacy:  Hold Apixaban  (Eliquis )     Type of Anesthesia:  General    Additional requests/questions:    Signed, Lucie DELENA Ku   02/14/2024, 3:41 PM

## 2024-02-14 NOTE — Telephone Encounter (Signed)
 Pharmacy please advise on holding Eliquis  prior to RIGHT SHOULDER ARTHROPLASTY SUBSCAPULARIS REPAIR  scheduled for TBD. Last labs 08/03/2023 and 07/24/2023 . Thank you.

## 2024-02-15 ENCOUNTER — Telehealth: Payer: Self-pay | Admitting: Family Medicine

## 2024-02-15 NOTE — Telephone Encounter (Signed)
 Copied from CRM [id number]. Topic: General - Other >> Feb 15, 2024  9:21 AM Treva T wrote:  Reason for CRM: Received call from patient, inquiring on status of surgery clearance for surgery needed on right shoulder.   Per patient states clearance form was faxed to office on 02/14/24.  Patient requesting a return call to discuss further, [phone].

## 2024-02-15 NOTE — Telephone Encounter (Signed)
 Representative from [facility] called to say they would not be the office that would send the surgical clearance form as they are not doing the surgery. She said she would try to figure out who is doing the surgery and call back

## 2024-02-15 NOTE — Telephone Encounter (Signed)
 Call placed to [facility] to get another form faxed over.

## 2024-02-16 DIAGNOSIS — I639 Cerebral infarction, unspecified: Secondary | ICD-10-CM | POA: Diagnosis not present

## 2024-02-16 NOTE — Telephone Encounter (Signed)
 Form has been received and will be faxed once completed this afternoon.

## 2024-02-16 NOTE — Telephone Encounter (Signed)
 Patient calling and states he is needing form to be completed. Patient states Dr. Addie from [facility] will be doing his surgery. Patient states he is needing form to be completed today so he is able to be scheduled for surgery, if form is not sent today he will have to wait until next month.

## 2024-02-17 DIAGNOSIS — I639 Cerebral infarction, unspecified: Secondary | ICD-10-CM | POA: Diagnosis not present

## 2024-02-18 DIAGNOSIS — I639 Cerebral infarction, unspecified: Secondary | ICD-10-CM | POA: Diagnosis not present

## 2024-02-19 DIAGNOSIS — Z7901 Long term (current) use of anticoagulants: Secondary | ICD-10-CM | POA: Insufficient documentation

## 2024-02-19 DIAGNOSIS — M75102 Unspecified rotator cuff tear or rupture of left shoulder, not specified as traumatic: Secondary | ICD-10-CM | POA: Insufficient documentation

## 2024-02-19 DIAGNOSIS — I639 Cerebral infarction, unspecified: Secondary | ICD-10-CM | POA: Diagnosis not present

## 2024-02-19 DIAGNOSIS — M25519 Pain in unspecified shoulder: Secondary | ICD-10-CM | POA: Insufficient documentation

## 2024-02-19 NOTE — Telephone Encounter (Signed)
 Pt. Scheduled for a tele visit with Reche Finder, NP on 02/27/24.

## 2024-02-19 NOTE — Telephone Encounter (Signed)
 Pt calling to set up tele visit

## 2024-02-19 NOTE — Telephone Encounter (Signed)
   Name: Jason Stokes  DOB: 1960/08/28  MRN: [id number]  Primary Cardiologist: Will Gladis Norton, MD   Preoperative team, please contact this patient and set up a phone call appointment for further preoperative risk assessment. Please obtain consent and complete medication review. Thank you for your help.  I confirm that guidance regarding antiplatelet and oral anticoagulation therapy has been completed and, if necessary, noted below.  Patient will need to hold Eliquis  for 3 days for shoulder repair. Previously cleared by Dr Norton.   Patient will not need bridging with Lovenox (enoxaparin) around procedure.  I also confirmed the patient resides in the state of [place] . As per [facility] Medical Board telemedicine laws, the patient must reside in the state in which the provider is licensed.   Josefa CHRISTELLA Beauvais, NP 02/19/2024, 2:59 PM [facility]

## 2024-02-19 NOTE — Telephone Encounter (Signed)
 Patient with diagnosis of A Fib on Eliquis  for anticoagulation.    Procedure: RIGHT SHOULDER ARTHROPLASTY SUBSCAPULARIS REPAIR  Date of procedure: TBD   CHA2DS2-VASc Score = 5  This indicates a 7.2% annual risk of stroke. The patient's score is based upon: CHF History: 0 HTN History: 1 Diabetes History: 1 Stroke History: 2 Vascular Disease History: 1 Age Score: 0 Gender Score: 0    CrCl 116 ml/min Platelet count 247K  Patient will need to hold Eliquis  for 3 days for shoulder repair. Previously cleared by Dr Inocencio.  Patient will not need bridging with Lovenox (enoxaparin) around procedure.  **This guidance is not considered finalized until pre-operative APP has relayed final recommendations.**

## 2024-02-19 NOTE — Telephone Encounter (Signed)
  Patient Consent for Virtual Visit        Jason Stokes has provided verbal consent on 02/19/2024 for a virtual visit (video or telephone).   CONSENT FOR VIRTUAL VISIT FOR:  Jason Stokes  By participating in this virtual visit I agree to the following:  I hereby voluntarily request, consent and authorize [name], physician assistants, nurse practitioners or other licensed health care professionals (the Practitioner), to provide me with telemedicine health care services (the "Services) as deemed necessary by the treating Practitioner. I acknowledge and consent to receive the Services by the Practitioner via telemedicine. I understand that the telemedicine visit will involve communicating with the Practitioner through live audiovisual communication technology and the disclosure of certain medical information by electronic transmission. I acknowledge that I have been given the opportunity to request an in-person assessment or other available alternative prior to the telemedicine visit and am voluntarily participating in the telemedicine visit.  I understand that I have the right to withhold or withdraw my consent to the use of telemedicine in the course of my care at any time, without affecting my right to future care or treatment, and that the Practitioner or I may terminate the telemedicine visit at any time. I understand that I have the right to inspect all information obtained and/or recorded in the course of the telemedicine visit and may receive copies of available information for a reasonable fee.  I understand that some of the potential risks of receiving the Services via telemedicine include:  Delay or interruption in medical evaluation due to technological equipment failure or disruption; Information transmitted may not be sufficient (e.g. poor resolution of images) to allow for appropriate medical decision making by the Practitioner;  and/or  In rare instances, security protocols could fail, causing a breach of personal health information.  Furthermore, I acknowledge that it is my responsibility to provide information about my medical history, conditions and care that is complete and accurate to the best of my ability. I acknowledge that Practitioner's advice, recommendations, and/or decision may be based on factors not within their control, such as incomplete or inaccurate data provided by me or distortions of diagnostic images or specimens that may result from electronic transmissions. I understand that the practice of medicine is not an exact science and that Practitioner makes no warranties or guarantees regarding treatment outcomes. I acknowledge that a copy of this consent can be made available to me via my patient portal [facility] MyChart), or I can request a printed copy by calling the office of  [facility].    I understand that my insurance will be billed for this visit.   I have read or had this consent read to me. I understand the contents of this consent, which adequately explains the benefits and risks of the Services being provided via telemedicine.  I have been provided ample opportunity to ask questions regarding this consent and the Services and have had my questions answered to my satisfaction. I give my informed consent for the services to be provided through the use of telemedicine in my medical care

## 2024-02-20 DIAGNOSIS — I639 Cerebral infarction, unspecified: Secondary | ICD-10-CM | POA: Diagnosis not present

## 2024-02-21 ENCOUNTER — Ambulatory Visit (INDEPENDENT_AMBULATORY_CARE_PROVIDER_SITE_OTHER)

## 2024-02-21 ENCOUNTER — Telehealth: Payer: Self-pay | Admitting: Orthopedic Surgery

## 2024-02-21 DIAGNOSIS — J309 Allergic rhinitis, unspecified: Secondary | ICD-10-CM | POA: Diagnosis not present

## 2024-02-21 DIAGNOSIS — I639 Cerebral infarction, unspecified: Secondary | ICD-10-CM | POA: Diagnosis not present

## 2024-02-21 NOTE — Telephone Encounter (Signed)
 Patient pending cardiac clearance. Medical clearance from PCP has been received with instructions to stop Eliquis  3 days prior to right shoulder surgery.  Patient  has cardiology  tele-visit appointment scheduled 02-27-24. Patient is asking if something can be sent to tie him over until he has the surgery.  [facility] uses [facility] [address]

## 2024-02-22 ENCOUNTER — Other Ambulatory Visit: Payer: Self-pay | Admitting: Allergy and Immunology

## 2024-02-22 ENCOUNTER — Other Ambulatory Visit: Payer: Self-pay | Admitting: Family Medicine

## 2024-02-22 DIAGNOSIS — E1169 Type 2 diabetes mellitus with other specified complication: Secondary | ICD-10-CM

## 2024-02-22 DIAGNOSIS — I639 Cerebral infarction, unspecified: Secondary | ICD-10-CM | POA: Diagnosis not present

## 2024-02-23 ENCOUNTER — Telehealth: Payer: Self-pay

## 2024-02-23 DIAGNOSIS — I639 Cerebral infarction, unspecified: Secondary | ICD-10-CM | POA: Diagnosis not present

## 2024-02-23 DIAGNOSIS — E1110 Type 2 diabetes mellitus with ketoacidosis without coma: Secondary | ICD-10-CM

## 2024-02-23 NOTE — Progress Notes (Signed)
 Complex Care Management Note  Care Guide Note 02/23/2024 Name: SAYED APOSTOL MRN: [id number] DOB: September 04, 1960  KRISTINE TILEY is a 63 y.o. year old male who sees Delbert Clam, MD for primary care. I reached out to Alexa LITTIE Sharps by phone today to offer complex care management services.  Mr. Enrico was given information about Complex Care Management services today including:   The Complex Care Management services include support from the care team which includes your Nurse Care Manager, Clinical Social Worker, or Pharmacist.  The Complex Care Management team is here to help remove barriers to the health concerns and goals most important to you. Complex Care Management services are voluntary, and the patient may decline or stop services at any time by request to their care team member.   Complex Care Management Consent Status: Patient agreed to services and verbal consent obtained.   Follow up plan:  Telephone appointment with complex care management team member scheduled for:  03/04/24 at 9:00 a.m.   Encounter Outcome:  Patient Scheduled  Dreama [facility]  [facility] Health Care Management Assistant Direct Dial: [phone]  Fax: [phone]

## 2024-02-24 DIAGNOSIS — I639 Cerebral infarction, unspecified: Secondary | ICD-10-CM | POA: Diagnosis not present

## 2024-02-25 DIAGNOSIS — I639 Cerebral infarction, unspecified: Secondary | ICD-10-CM | POA: Diagnosis not present

## 2024-02-26 DIAGNOSIS — I639 Cerebral infarction, unspecified: Secondary | ICD-10-CM | POA: Diagnosis not present

## 2024-02-27 ENCOUNTER — Telehealth (HOSPITAL_BASED_OUTPATIENT_CLINIC_OR_DEPARTMENT_OTHER): Admitting: Family

## 2024-02-27 ENCOUNTER — Encounter (HOSPITAL_BASED_OUTPATIENT_CLINIC_OR_DEPARTMENT_OTHER): Payer: Self-pay | Admitting: Family

## 2024-02-27 VITALS — BP 106/99

## 2024-02-27 DIAGNOSIS — E785 Hyperlipidemia, unspecified: Secondary | ICD-10-CM | POA: Diagnosis not present

## 2024-02-27 DIAGNOSIS — I10 Essential (primary) hypertension: Secondary | ICD-10-CM | POA: Diagnosis not present

## 2024-02-27 DIAGNOSIS — Z01810 Encounter for preprocedural cardiovascular examination: Secondary | ICD-10-CM

## 2024-02-27 DIAGNOSIS — I2510 Atherosclerotic heart disease of native coronary artery without angina pectoris: Secondary | ICD-10-CM | POA: Diagnosis not present

## 2024-02-27 DIAGNOSIS — I639 Cerebral infarction, unspecified: Secondary | ICD-10-CM | POA: Diagnosis not present

## 2024-02-27 NOTE — Progress Notes (Signed)
 Virtual Visit via Telephone Note   Because of Jason Stokes's co-morbid illnesses, he is at least at moderate risk for complications without adequate follow up.  This format is felt to be most appropriate for this patient at this time.  The patient did not have access to video technology/had technical difficulties with video requiring transitioning to audio format only (telephone).  All issues noted in this document were discussed and addressed.  No physical exam could be performed with this format.  Please refer to the patient's chart for his consent to telehealth for Jason Stokes.    Date:  02/27/2024   ID:  [name], DOB 06-Apr-1961, MRN [id number] The patient was identified using 2 identifiers.  Patient Location: Home Provider Location: Home Office   PCP:  Delbert Clam, MD   [facility] Providers Cardiologist:  Will Gladis Norton, MD Electrophysiologist:  Will Gladis Norton, MD     Evaluation Performed:  Follow-Up Visit  Chief Complaint:  Preop clearance  History of Present Illness:    Jason Stokes is a 63 y.o. male with PAF s/p ablation 11/2015, HTN, coronary calcification on CT, HLD. Of note CT cardiac morph 04/2022 with calcium  score of 20.6 placing in 61st percentile for age/race/sex matched controls. Last seen 09/22/23 by EP team doing well.   Presents today for preoperative clearance for right shoulder arthroplasty subscapularis repair. Per pharmacy team, office protocols may hold Eliquis  3 days prior to surgery and will not require bridging with Lovenox.   Follow-up today via phone visit.  Reports feeling overall well since last seen. Reports no shortness of breath nor dyspnea on exertion. Reports no chest pain, pressure, or tightness. No edema, orthopnea, PND. Reports one episode of palpitations last week that resolved very quickly while working in the yard which he attributes to dehydration.    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)    a.) s/p embolization 08/19/2020: 29 mm RIGHT internal iliac artery aneurysm   Anxiety    Aortic atherosclerosis (HCC)    Bilateral carpal tunnel syndrome 01/10/2018   Bipolar disorder (HCC)    Chorioretinal inflammation of both eyes    a.) on azothioprine   Chronic lower back pain    Coronary artery calcification seen on CT scan    a.) cCTA 04/21/2022: Ca score 20.6 (61st percentile for age/sex match control)   DDD (degenerative disc disease), cervical    Depression    Diastolic dysfunction    a.) TTE 01/13/2021: EF 60-65%, mod LVH, triv MR, G1DD   GERD (gastroesophageal reflux disease)    Hepatic steatosis    History of alcohol abuse    History of nuclear stress test    Myoview 10/16: EF 50%, diaphragmatic attenuation, no ischemia, low risk   Hypertension    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   a.) CT head 12/25/2012 --> RIGHT basal ganglia hypoattenuation related to remote lacunar infarct   Lipoma    Long term (current) use of anticoagulants    a.) apixaban    Long-term current use of immunomodulator    a.) on azothioprine for peripheral focal chorioretinal inflammation (both eyes)   Marijuana use    Migraine    Moderate persistent asthma with acute exacerbation 05/02/2018   PAF (paroxysmal atrial fibrillation) (HCC) 03/27/2015   a.) CHA2DS2VASc = 5 (HTN, CVA x2, vascular disease history, T2DM);  b.) s/p ablation 10/02/2015; c.) s/p ablation 12/10/2015; d.)  s/p DCCV (200 J x 1) 12/11/2015; e.) s/p ablation 04/28/2022; f.) rate/rhythm maintained on oral diltiazem  + carvedilol ; chronically anticoagulated with apixaban    Pilonidal cyst    Schizophrenia (HCC)    Sciatica neuralgia    T2DM (type 2 diabetes mellitus) (HCC)    Thoracic aortic ectasia (HCC) 01/13/2021   a.) TTE 01/13/2021: Ao root 38 mm, asc Ao 39 mm   Past Surgical History:  Procedure Laterality Date   ATRIAL FIBRILLATION ABLATION  09/22/2015   ATRIAL  FIBRILLATION ABLATION N/A 04/28/2022   Procedure: ATRIAL FIBRILLATION ABLATION;  Surgeon: Inocencio Soyla Lunger, MD;  Location: [facility];  Service: Cardiovascular;  Laterality: N/A;   CATARACT EXTRACTION Bilateral    CYST EXCISION  1996-97   surgery back of head    ELECTROPHYSIOLOGIC STUDY N/A 09/22/2015   Procedure: Atrial Fibrillation Ablation;  Surgeon: Will Lunger Inocencio, MD;  Location: [facility];  Service: Cardiovascular;  Laterality: N/A;   ELECTROPHYSIOLOGIC STUDY N/A 12/10/2015   Procedure: Atrial Fibrillation Ablation;  Surgeon: Will Lunger Inocencio, MD;  Location: [facility];  Service: Cardiovascular;  Laterality: N/A;   ELECTROPHYSIOLOGIC STUDY N/A 12/11/2015   Procedure: Cardioversion;  Surgeon: Will Lunger Inocencio, MD;  Location: [facility];  Service: Cardiovascular;  Laterality: N/A;   EMBOLIZATION (CATH LAB) Right 08/19/2020   Procedure: EMBOLIZATION;  Surgeon: Magda Debby SAILOR, MD;  Location: [facility];  Service: Cardiovascular;  Laterality: Right;  hypogastric   EXCISION MASS HEAD N/A 01/06/2017   Procedure: EXCISION MASS FOREHEAD;  Surgeon: Arelia Filippo, MD;  Location: [facility];  Service: Plastics;  Laterality: N/A;   EXCISION MASS UPPER EXTREMETIES Right 08/08/2022   Procedure: EXCISION MASS RIGHT FOREARM;  Surgeon: Murrell Drivers, MD;  Location: [facility];  Service: Orthopedics;  Laterality: Right;  45 MIN   GANGLION CYST EXCISION Left    INTERCOSTAL NERVE BLOCK  07/19/2003   KNEE ARTHROSCOPY Right 07/18/2014   MASS EXCISION N/A 01/25/2021   Procedure: EXCISION SUBCUTANEOUS VS SUBFASCIAL MASS TORSO 3CM;  Surgeon: Arelia Filippo, MD;  Location: [facility];  Service: Plastics;  Laterality: N/A;   PILONIDAL CYST EXCISION N/A 09/13/2022   Procedure: CYST EXCISION PILONIDAL EXTENSIVE;  Surgeon: Desiderio Schanz, MD;  Location: [facility];  Service: General;  Laterality: N/A;   SHOULDER  ARTHROSCOPY WITH ROTATOR CUFF REPAIR AND OPEN BICEPS TENODESIS Left 07/26/2023   Procedure: LEFT SHOULDER ARTHROSCOPY WITH OPEN ROTATOR CUFF REPAIR AND OPEN BICEPS TENODESIS;  Surgeon: Barbarann Oneil BROCKS, MD;  Location: [facility];  Service: Orthopedics;  Laterality: Left;     Current Meds  Medication Sig   ACCU-CHEK GUIDE TEST test strip USE TO CHECK BLOOD SUGAR ONCE DAILY. E11.69   Accu-Chek Softclix Lancets lancets Use as instructed   acetaminophen -codeine  (TYLENOL  #3) 300-30 MG tablet 1 po q 8hrs prn pain   atorvastatin  (LIPITOR) 40 MG tablet Take 1 tablet (40 mg total) by mouth daily.   azaTHIOprine (IMURAN) 50 MG tablet Take 100 mg by mouth every morning.   azelastine  (ASTELIN ) 0.1 % nasal spray Place 1 spray into both nostrils 2 (two) times daily. 1 spray each nostril twice a day   brimonidine (ALPHAGAN) 0.2 % ophthalmic solution Place 1 drop into both eyes every 8 (eight) hours.   carvedilol  (COREG ) 25 MG tablet Take 1 tablet (25 mg total) by mouth 2 (two) times daily.   cetirizine  (ZYRTEC ) 10 MG tablet Take 1 tablet (10 mg total)  by mouth daily as needed for allergies (Can take an extra dose during flare ups.).   cromolyn  (OPTICROM ) 4 % ophthalmic solution PLACE 1 DROP INTO BOTH EYES 4 (FOUR) TIMES DAILY AS NEEDED.   Difluprednate 0.05 % EMUL Place 1 drop into both eyes 2 (two) times daily.   diltiazem  (CARDIZEM  CD) 180 MG 24 hr capsule TAKE 1 CAPSULE BY MOUTH EVERY DAY   dorzolamide-timolol (COSOPT) 22.3-6.8 MG/ML ophthalmic solution Place 1 drop into both eyes 2 (two) times daily.   DULoxetine  (CYMBALTA ) 60 MG capsule Take 1 capsule (60 mg total) by mouth daily. For chronic pain and neuropathy   ELIQUIS  5 MG TABS tablet TAKE 1 TABLET BY MOUTH TWICE A DAY   EPINEPHrine  0.3 mg/0.3 mL IJ SOAJ injection Inject 0.3 mg into the muscle as needed for anaphylaxis.   fluticasone  (FLONASE ) 50 MCG/ACT nasal spray Place 1 spray into both nostrils 2 (two) times daily. 1 spray each nostril  twice a day   fluticasone  furoate-vilanterol (BREO ELLIPTA ) 200-25 MCG/ACT AEPB Inhale 1 puff into the lungs in the morning.   folic acid (FOLVITE) 1 MG tablet Take 1 mg by mouth daily.   furosemide  (LASIX ) 20 MG tablet Take 1 tablet (20 mg total) by mouth daily.   GARLIC PO Take 1 tablet by mouth daily.   ipratropium (ATROVENT ) 0.03 % nasal spray Place 2 sprays into both nostrils 4 (four) times daily as needed for rhinitis.   latanoprost (XALATAN) 0.005 % ophthalmic solution Place 1 drop into both eyes daily.   methocarbamol  (ROBAXIN ) 500 MG tablet Take 1 tablet (500 mg total) by mouth every 8 (eight) hours as needed for muscle spasms.   Misc. Devices MISC Back brace. Diagnosis chronic back pain   Misc. Devices MISC Left knee brace. Diagnosis L knee pain   mometasone-formoterol (DULERA ) 200-5 MCG/ACT AERO Inhale 2 puffs into the lungs 2 (two) times daily.   montelukast  (SINGULAIR ) 10 MG tablet Take 1 tablet (10 mg total) by mouth at bedtime.   Olopatadine  HCl (PATADAY ) 0.2 % SOLN Place 1 drop into both eyes daily as needed.   omeprazole  (PRILOSEC) 20 MG capsule TAKE 1 CAPSULE BY MOUTH EVERY DAY   oxyCODONE -acetaminophen  (PERCOCET) 5-325 MG tablet Take 1-2 tablets by mouth every 6 (six) hours as needed for severe pain (pain score 7-10).   valsartan  (DIOVAN ) 80 MG tablet Take 1 tablet (80 mg total) by mouth daily.   VENTOLIN  HFA 108 (90 Base) MCG/ACT inhaler INHALE 2 PUFFS INTO THE LUNGS UP TO EVERY 4 HOURS AS NEEDED FOR WHEEZING OR SHORTNESS OF BREATH   Vitamin D3 (VITAMIN D) 25 MCG tablet Take 1,000 Units by mouth daily.     Allergies:   Lisinopril, Tylenol  [acetaminophen ], and Lyrica  [pregabalin ]   Social History   Tobacco Use   Smoking status: Some Days    Current packs/day: 0.00    Types: Cigarettes    Start date: 09/10/1993    Last attempt to quit: 09/10/2022    Years since quitting: 1.4    Passive exposure: Never   Smokeless tobacco: Never   Tobacco comments:    1 cigarettes  every day  06-02-2021  Vaping Use   Vaping status: Never Used  Substance Use Topics   Alcohol use: Yes    Alcohol/week: 6.0 standard drinks of alcohol    Types: 6 Cans of beer per week    Comment: occasional   Drug use: Yes    Types: Marijuana    Comment: been 1 week  Family Hx: The patient's family history includes Heart disease in his father; Schizophrenia in his sister.  ROS:   Please see the history of present illness.     All other systems reviewed and are negative.   Prior CV studies:   The following studies were reviewed today:  Cardiac Studies & Procedures   ______________________________________________________________________________________________     ECHOCARDIOGRAM  ECHOCARDIOGRAM COMPLETE 01/13/2021  Narrative ECHOCARDIOGRAM REPORT    Patient Name:   ARSHAWN VALDEZ Date of Exam: 01/13/2021 Medical Rec #:  [id number]      Height:       73.0 in Accession #:    [id number]     Weight:       262.4 lb Date of Birth:  09-Mar-1961      BSA:          2.415 m Patient Age:    59 years       BP:           128/84 mmHg Patient Gender: M              HR:           85 bpm. Exam Location:  [address]  Procedure: 2D Echo, Cardiac Doppler and Color Doppler  Indications:    R00.2 Palpitations  History:        Patient has prior history of Echocardiogram examinations, most recent 03/31/2015. Stroke, Arrythmias:Atrial Fibrillation; Risk Factors:Palpitations and Hypertension.  Sonographer:    Elsie Bohr RDCS Referring Phys: [id number] RENEE LYNN URSUY  IMPRESSIONS   1. Left ventricular ejection fraction, by estimation, is 60 to 65%. The left ventricle has normal function. The left ventricle has no regional wall motion abnormalities. There is moderate left ventricular hypertrophy. Left ventricular diastolic parameters are consistent with Grade I diastolic dysfunction (impaired relaxation). Elevated left ventricular end-diastolic pressure. The E/e' is 16. 2.  Right ventricular systolic function is normal. The right ventricular size is normal. 3. The mitral valve is grossly normal. Trivial mitral valve regurgitation. 4. The aortic valve is tricuspid. Aortic valve regurgitation is not visualized. 5. Aortic dilatation noted. There is borderline dilatation of the aortic root, measuring 38 mm. There is mild dilatation of the ascending aorta, measuring 39 mm. 6. The inferior vena cava is normal in size with greater than 50% respiratory variability, suggesting right atrial pressure of 3 mmHg.  FINDINGS Left Ventricle: Left ventricular ejection fraction, by estimation, is 60 to 65%. The left ventricle has normal function. The left ventricle has no regional wall motion abnormalities. The left ventricular internal cavity size was normal in size. There is moderate left ventricular hypertrophy. Left ventricular diastolic parameters are consistent with Grade I diastolic dysfunction (impaired relaxation). Elevated left ventricular end-diastolic pressure. The E/e' is 16.  Right Ventricle: The right ventricular size is normal. No increase in right ventricular wall thickness. Right ventricular systolic function is normal.  Left Atrium: Left atrial size was normal in size.  Right Atrium: Right atrial size was normal in size.  Pericardium: There is no evidence of pericardial effusion.  Mitral Valve: The mitral valve is grossly normal. Trivial mitral valve regurgitation.  Tricuspid Valve: The tricuspid valve is grossly normal. Tricuspid valve regurgitation is trivial.  Aortic Valve: The aortic valve is tricuspid. Aortic valve regurgitation is not visualized.  Pulmonic Valve: The pulmonic valve was grossly normal. Pulmonic valve regurgitation is trivial.  Aorta: Aortic dilatation noted. There is borderline dilatation of the aortic root, measuring 38 mm. There is mild dilatation of the  ascending aorta, measuring 39 mm.  Venous: The inferior vena cava is normal in  size with greater than 50% respiratory variability, suggesting right atrial pressure of 3 mmHg.  IAS/Shunts: No atrial level shunt detected by color flow Doppler.   LEFT VENTRICLE PLAX 2D LVIDd:         4.50 cm  Diastology LVIDs:         3.20 cm  LV e' medial:    7.62 cm/s LV PW:         1.50 cm  LV E/e' medial:  16.7 LV IVS:        1.80 cm  LV e' lateral:   8.49 cm/s LVOT diam:     2.50 cm  LV E/e' lateral: 15.0 LV SV:         147 LV SV Index:   61 LVOT Area:     4.91 cm   RIGHT VENTRICLE             IVC RV S prime:     13.70 cm/s  IVC diam: 1.50 cm TAPSE (M-mode): 2.0 cm  LEFT ATRIUM             Index       RIGHT ATRIUM           Index LA diam:        4.80 cm 1.99 cm/m  RA Pressure: 3.00 mmHg LA Vol (A2C):   66.6 ml 27.58 ml/m RA Area:     17.80 cm LA Vol (A4C):   60.8 ml 25.18 ml/m RA Volume:   51.00 ml  21.12 ml/m LA Biplane Vol: 65.1 ml 26.96 ml/m AORTIC VALVE LVOT Vmax:   133.00 cm/s LVOT Vmean:  95.500 cm/s LVOT VTI:    0.299 m  AORTA Ao Root diam: 3.80 cm Ao Asc diam:  3.90 cm  MV E velocity: 127.00 cm/s  TRICUSPID VALVE MV A velocity: 79.00 cm/s   Estimated RAP:  3.00 mmHg MV E/A ratio:  1.61 SHUNTS Systemic VTI:  0.30 m Systemic Diam: 2.50 cm  Vinie Maxcy MD Electronically signed by Vinie Maxcy MD Signature Date/Time: 01/13/2021/12:50:34 PM    Final          ______________________________________________________________________________________________       Labs/Other Tests and Data Reviewed:    EKG:  An ECG dated 09/22/23 was personally reviewed today and demonstrated:  NSR 74 bpm with no acute St/T wave changes.   Recent Labs: 08/03/2023: Hemoglobin 11.5; Platelets 247 01/31/2024: ALT 10; BUN 9; Creatinine, Ser 1.01; Potassium 4.0; Sodium 141   Recent Lipid Panel Lab Results  Component Value Date/Time   CHOL 148 05/24/2023 11:09 AM   TRIG 159 (H) 05/24/2023 11:09 AM   HDL 42 05/24/2023 11:09 AM   CHOLHDL 5.2 (H) 05/07/2018  10:04 AM   CHOLHDL 5.4 10/17/2013 10:42 AM   LDLCALC 79 05/24/2023 11:09 AM    Wt Readings from Last 3 Encounters:  01/31/24 239 lb 3.2 oz (108.5 kg)  10/03/23 245 lb (111.1 kg)  09/22/23 251 lb 6.4 oz (114 kg)     Risk Assessment/Calculations:    CHA2DS2-VASc Score = 5   This indicates a 7.2% annual risk of stroke. The patient's score is based upon: CHF History: 0 HTN History: 1 Diabetes History: 1 Stroke History: 2 Vascular Disease History: 1 Age Score: 0 Gender Score: 0         Objective:    Vital Signs:  BP (!) 106/99    VITAL SIGNS:  reviewed  ASSESSMENT & PLAN:    Preoperative clearance - According to the Revised Cardiac Risk Index (RCRI), his Perioperative Risk of Major Cardiac Event is (%): 0.9. His Functional Capacity in METs is: 4.3 according to the [facility] Activity Status Index (DASI). Per AHA/ACC guidelines, he is deemed acceptable risk for the planned procedure without additional cardiovascular testing. Will route to surgical team so they are aware.   Antiplatelet/Anticoagulant: Hold Eliquis  3 days prior to planned procedure. He is aware of this recommendation.   PAF s/p ablation 11/2015 / Hypercoagulable state - reports isolated episode of palpitations which self resolved. Continue coreg  25mg  BID, diltiazem  180mg  daily, eliquis  5mg  BID.  Denies bleeding complications.  Does not meet dose reduction criteria. CHA2DS2-VASc Score = 5 [CHF History: 0, HTN History: 1, Diabetes History: 1, Stroke History: 2, Vascular Disease History: 1, Age Score: 0, Gender Score: 0].  Therefore, the patient's annual risk of stroke is 7.2 %.      HTN - BP well controlled by home readings. Continue current antihypertensive regimen Coreg  25mg  BID, diltaize 180mg  daily, Lasix  20mg  daily, Valsartan  80mg  daily. Discussed to monitor BP at home at least 2 hours after medications and sitting for 5-10 minutes.   Coronary calcification on CT / HLD, LDL goal <70 - Stable with no anginal symptoms. No  indication for ischemic evaluation.  GDMT Coreg  25mg  BID, Atorvastatin  40mg  daily, no ASA due to OAC. Recommend aiming for 150 minutes of moderate intensity activity per week and following a heart healthy diet.     Time:   Today, I have spent 5 minutes with the patient with telehealth technology discussing the above problems.     Medication Adjustments/Labs and Tests Ordered: Current medicines are reviewed at length with the patient today.  Concerns regarding medicines are outlined above.   Tests Ordered: No orders of the defined types were placed in this encounter.   Medication Changes: No orders of the defined types were placed in this encounter.   Follow Up:  03/2024 with EP per recall (does not need to be completed prior to surgery)  Signed, Reche GORMAN Finder, NP  02/27/2024 2:17 PM    [facility]

## 2024-02-28 ENCOUNTER — Telehealth: Payer: Self-pay | Admitting: Family

## 2024-02-28 ENCOUNTER — Other Ambulatory Visit: Payer: Self-pay | Admitting: Family Medicine

## 2024-02-28 ENCOUNTER — Other Ambulatory Visit: Payer: Self-pay | Admitting: Orthopedic Surgery

## 2024-02-28 DIAGNOSIS — I639 Cerebral infarction, unspecified: Secondary | ICD-10-CM | POA: Diagnosis not present

## 2024-02-28 MED ORDER — TRAMADOL HCL 50 MG PO TABS: 50.0000 mg | ORAL_TABLET | Freq: Two times a day (BID) | ORAL | 0 refills | Status: DC | PRN

## 2024-02-28 NOTE — Telephone Encounter (Signed)
Ultram sent thx

## 2024-02-28 NOTE — Telephone Encounter (Signed)
 Pt states he was supposed to have a phone call with Glennon Finder regarding preop clearance and never received a call. Please advise.

## 2024-02-29 ENCOUNTER — Ambulatory Visit (INDEPENDENT_AMBULATORY_CARE_PROVIDER_SITE_OTHER)

## 2024-02-29 DIAGNOSIS — J309 Allergic rhinitis, unspecified: Secondary | ICD-10-CM | POA: Diagnosis not present

## 2024-02-29 DIAGNOSIS — I639 Cerebral infarction, unspecified: Secondary | ICD-10-CM | POA: Diagnosis not present

## 2024-03-01 DIAGNOSIS — I639 Cerebral infarction, unspecified: Secondary | ICD-10-CM | POA: Diagnosis not present

## 2024-03-02 DIAGNOSIS — I639 Cerebral infarction, unspecified: Secondary | ICD-10-CM | POA: Diagnosis not present

## 2024-03-03 DIAGNOSIS — I639 Cerebral infarction, unspecified: Secondary | ICD-10-CM | POA: Diagnosis not present

## 2024-03-04 ENCOUNTER — Ambulatory Visit (INDEPENDENT_AMBULATORY_CARE_PROVIDER_SITE_OTHER)

## 2024-03-04 ENCOUNTER — Encounter (HOSPITAL_COMMUNITY): Payer: Self-pay | Admitting: Medical

## 2024-03-04 ENCOUNTER — Other Ambulatory Visit: Payer: Self-pay

## 2024-03-04 ENCOUNTER — Encounter (HOSPITAL_COMMUNITY): Payer: Self-pay | Admitting: Orthopedic Surgery

## 2024-03-04 DIAGNOSIS — J309 Allergic rhinitis, unspecified: Secondary | ICD-10-CM | POA: Diagnosis not present

## 2024-03-04 DIAGNOSIS — I639 Cerebral infarction, unspecified: Secondary | ICD-10-CM | POA: Diagnosis not present

## 2024-03-04 NOTE — Patient Outreach (Signed)
 Complex Care Management   Visit Note  03/04/2024  Name:  Jason Stokes MRN: [id number] DOB: 05/31/1961  Situation: Referral received for Complex Care Management related to Diabetes I obtained verbal consent from Patient.  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)    a.) s/p embolization 08/19/2020: 29 mm RIGHT internal iliac artery aneurysm   Anxiety    Aortic atherosclerosis (HCC)    Bilateral carpal tunnel syndrome 01/10/2018   Bipolar disorder (HCC)    Chorioretinal inflammation of both eyes    a.) on azothioprine   Chronic lower back pain    Coronary artery calcification seen on CT scan    a.) cCTA 04/21/2022: Ca score 20.6 (61st percentile for age/sex match control)   DDD (degenerative disc disease), cervical    Depression    Diastolic dysfunction    a.) TTE 01/13/2021: EF 60-65%, mod LVH, triv MR, G1DD   GERD (gastroesophageal reflux disease)    Hepatic steatosis    History of alcohol abuse    History of nuclear stress test    Myoview 10/16: EF 50%, diaphragmatic attenuation, no ischemia, low risk   Hypertension    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   a.) CT head 12/25/2012 --> RIGHT basal ganglia hypoattenuation related to remote lacunar infarct   Lipoma    Long term (current) use of anticoagulants    a.) apixaban    Long-term current use of immunomodulator    a.) on azothioprine for peripheral focal chorioretinal inflammation (both eyes)   Marijuana use    Migraine    Moderate persistent asthma with acute exacerbation 05/02/2018   PAF (paroxysmal atrial fibrillation) (HCC) 03/27/2015   a.) CHA2DS2VASc = 5 (HTN, CVA x2, vascular disease history, T2DM);  b.) s/p ablation 10/02/2015; c.) s/p ablation 12/10/2015; d.) s/p DCCV (200 J x 1) 12/11/2015; e.) s/p ablation 04/28/2022; f.) rate/rhythm maintained on oral diltiazem  + carvedilol ; chronically  anticoagulated with apixaban    Pilonidal cyst    Schizophrenia (HCC)    Sciatica neuralgia    T2DM (type 2 diabetes mellitus) (HCC)    Thoracic aortic ectasia (HCC) 01/13/2021   a.) TTE 01/13/2021: Ao root 38 mm, asc Ao 39 mm    Assessment: Patient Reported Symptoms:  Cognitive Cognitive Status: Able to follow simple commands, Alert and oriented to person, place, and time, Normal speech and language skills Cognitive/Intellectual Conditions Management [RPT]: None reported or documented in medical history or problem list      Neurological Neurological Review of Symptoms: Other: [place] Neurological Symptoms/Conditions [RPT]: Nerve pain R leg, pinched nerve in back Neurological Management Strategies: Medication therapy, Routine screening  HEENT HEENT Symptoms Reported: Other: HEENT Management Strategies: Routine screening, Medication therapy HEENT Comment: Patient reports he has an upper partial plate. He has not seen his dentist since 2020. He reports he needs to schedule an appointment to have partial plate refit.    Cardiovascular Cardiovascular Symptoms Reported: Swelling in legs or feet (Swelling L>R) Does patient have uncontrolled Hypertension?: Yes Is patient checking Blood Pressure at home?: No Patient's Recent BP reading at home: Patient does have a BP monitor. Advised to check 2-3 days a week. Cardiovascular Management Strategies: Medication therapy, Routine screening, Coping strategies Cardiovascular Comment: Swelling L>R due to car ride last week. Educated on s/s of DVT, patient denies other symptoms. Patient is taking blood thinner once a day and compliant  with statin. He does wear compression socks intermittently.  Respiratory Respiratory Symptoms Reported: Wheezing, Productive cough Other Respiratory Symptoms: Wheezing during high pollen seasons. Cough productive of clear phlegm daily. Patient states I'm allergic to everything outside. Patient follows with allergist, last  visit 01/30/24. Note that he is using all inhalers as needed as opposed to daily. Patient reports allergist is aware of current medication regimen. He continues to get weekly immunotherapy injections Additional Respiratory Details: Patient reports smoking occasionally, max of 1 cigarette a day. Respiratory Management Strategies: Medication therapy, Routine screening, Adequate rest, Coping strategies  Endocrine Endocrine Symptoms Reported: No symptoms reported Is patient diabetic?: Yes Is patient checking blood sugars at home?: Yes List most recent blood sugar readings, include date and time of day: Fasting daily. Patient reports he is waiting on more test strips to be ready for pickup, has been out for about 2 weeks now and is waiting for PA. He reports blood sugar is typcally right under 100    Gastrointestinal Gastrointestinal Symptoms Reported: No symptoms reported Additional Gastrointestinal Details: Patient reports a good appetite. Last BM today      Genitourinary Genitourinary Symptoms Reported: No symptoms reported    Integumentary Integumentary Symptoms Reported: Other Other Integumentary Symptoms: Bumps all up and down my backside and up to my face Skin Management Strategies: Routine screening Skin Comment: Upcoming appointment with dermatology 04/10/24  Musculoskeletal Musculoskelatal Symptoms Reviewed: Back pain, Joint pain Additional Musculoskeletal Details: Patient had surgery on his L shoulder the beginning of the year and is scheduled for arthroscopy with debridement of the R shoulder tomorrow, 03/05/24. Patient reports he continues to do exercsies for his shoulder. Musculoskeletal Management Strategies: Exercise, Routine screening, Coping strategies, Activity Falls in the past year?: No Number of falls in past year: 1 or less Was there an injury with Fall?: No Fall Risk Category Calculator: 0 Patient Fall Risk Level: Low Fall Risk Patient at Risk for Falls Due to: No Fall  Risks Fall risk Follow up: Falls evaluation completed, Education provided, Falls prevention discussed  Psychosocial Psychosocial Symptoms Reported: Depression - if selected complete PHQ 2-9 Additional Psychological Details: Patient notes he does not wish to be started on medications related to mental health Behavioral Management Strategies: Support system, Coping strategies Major Change/Loss/Stressor/Fears (CP): Medical condition, self Techniques to Cope with Loss/Stress/Change: Spiritual practice(s), Diversional activities (Support system) Quality of Family Relationships: helpful, involved, supportive Do you feel physically threatened by others?: No      03/04/2024    9:50 AM  Depression screen PHQ 2/9  Decreased Interest 0  Down, Depressed, Hopeless 1  PHQ - 2 Score 1    There were no vitals filed for this visit.  Medications Reviewed Today     Reviewed by Arno Rosaline SQUIBB, RN (Registered Nurse) on 03/04/24 at 0935  Med List Status: <None>   Medication Order Taking? Sig Documenting Provider Last Dose Status Informant  ACCU-CHEK GUIDE TEST test strip [id number] Yes USE TO CHECK BLOOD SUGAR ONCE DAILY. E11.69 Newlin, Enobong, MD  Active Self, Spouse/Significant Other, Pharmacy Records  Accu-Chek Softclix Lancets lancets [id number] Yes Use as instructed Newlin, Enobong, MD  Active Self, Spouse/Significant Other, Pharmacy Records  atorvastatin  (LIPITOR) 40 MG tablet [id number] Yes Take 1 tablet (40 mg total) by mouth daily. Newlin, Enobong, MD  Active Self, Spouse/Significant Other, Pharmacy Records  azelastine  (ASTELIN ) 0.1 % nasal spray [id number] Yes Place 1 spray into both nostrils 2 (two) times daily. 1 spray each nostril twice a day  Patient taking differently:  Place 1 spray into both nostrils 2 (two) times daily as needed for rhinitis or allergies.   Kozlow, Eric J, MD  Active Self, Spouse/Significant Other, Pharmacy Records  brimonidine [facility]) 0.2 % ophthalmic solution  [id number] Yes Place 1 drop into both eyes every 8 (eight) hours. [provider]  Active Self, Spouse/Significant Other, Pharmacy Records  cetirizine  (ZYRTEC ) 10 MG tablet [id number] Yes Take 1 tablet (10 mg total) by mouth daily as needed for allergies (Can take an extra dose during flare ups.). Kozlow, Camellia PARAS, MD  Active Self, Spouse/Significant Other, Pharmacy Records  cromolyn  (OPTICROM ) 4 % ophthalmic solution [id number] Yes PLACE 1 DROP INTO BOTH EYES 4 (FOUR) TIMES DAILY AS NEEDED. Kozlow, Eric J, MD  Active Self, Spouse/Significant Other, Pharmacy Records  dorzolamide-timolol (COSOPT) 22.3-6.8 MG/ML ophthalmic solution [id number] Yes Place 1 drop into both eyes 2 (two) times daily. [provider]  Active Self, Spouse/Significant Other, Pharmacy Records  DULoxetine  (CYMBALTA ) 60 MG capsule [id number] Yes Take 1 capsule (60 mg total) by mouth daily. For chronic pain and neuropathy  Patient taking differently: Take 60 mg by mouth daily as needed (when having nerve pain). For chronic pain and neuropathy   Delbert Clam, MD  Active Self, Spouse/Significant Other, Pharmacy Records  ELIQUIS  5 MG TABS tablet [id number] Yes TAKE 1 TABLET BY MOUTH TWICE A DAY  Patient taking differently: Take 5 mg by mouth daily.   Inocencio Soyla Lunger, MD  Active Self, Spouse/Significant Other, Pharmacy Records  EPINEPHrine  0.3 mg/0.3 mL IJ SOAJ injection [id number] Yes Inject 0.3 mg into the muscle as needed for anaphylaxis. Kozlow, Eric J, MD  Active Self, Spouse/Significant Other, Pharmacy Records  fluticasone  (FLONASE ) 50 MCG/ACT nasal spray [id number] Yes Place 1 spray into both nostrils 2 (two) times daily. 1 spray each nostril twice a day  Patient taking differently: Place 1 spray into both nostrils 2 (two) times daily as needed for allergies or rhinitis.   Kozlow, Eric J, MD  Active Self, Spouse/Significant Other, Pharmacy Records  fluticasone  furoate-vilanterol (BREO ELLIPTA ) 200-25 MCG/ACT AEPB  [id number] Yes Inhale 1 puff into the lungs in the morning.  Patient taking differently: Inhale 1 puff into the lungs daily as needed (Asthma).   Kozlow, Eric J, MD  Active Self, Spouse/Significant Other, Pharmacy Records  folic acid (FOLVITE) 1 MG tablet [id number] Yes Take 1 mg by mouth daily. [provider]  Active Spouse/Significant Other, Self, Pharmacy Records  furosemide  (LASIX ) 20 MG tablet [id number]  Take 1 tablet (20 mg total) by mouth daily.  Patient not taking: Reported on 03/04/2024   Newlin, Enobong, MD  Active Self, Spouse/Significant Other, Pharmacy Records  GARLIC PO [id number] Yes Take 1 tablet by mouth daily. [provider]  Active Spouse/Significant Other, Self, Pharmacy Records  ipratropium (ATROVENT ) 0.03 % nasal spray [id number] Yes Place 2 sprays into both nostrils 4 (four) times daily as needed for rhinitis. Lorin Norris, MD  Active Self, Spouse/Significant Other, Pharmacy Records  latanoprost (XALATAN) 0.005 % ophthalmic solution [id number] Yes Place 1 drop into both eyes daily. [provider]  Active Self, Spouse/Significant Other, Pharmacy Records  methocarbamol  (ROBAXIN ) 500 MG tablet [id number] Yes Take 1 tablet (500 mg total) by mouth every 8 (eight) hours as needed for muscle spasms. Newlin, Enobong, MD  Active Self, Spouse/Significant Other, Pharmacy Records  Misc. Devices MISC [id number] Yes Back brace. Diagnosis chronic back pain Delbert Clam, MD  Active Spouse/Significant Other, Self, Pharmacy Records  Misc. Devices MISC [id number] Yes Left knee brace. Diagnosis L  knee pain Delbert Clam, MD  Active Spouse/Significant Other, Self, Pharmacy Records  mometasone-formoterol (DULERA ) 200-5 MCG/ACT AERO [id number] Yes Inhale 2 puffs into the lungs 2 (two) times daily.  Patient taking differently: Inhale 2 puffs into the lungs 2 (two) times daily as needed for wheezing or shortness of breath.   Kozlow, Eric J, MD  Active Self,  Spouse/Significant Other, Pharmacy Records  montelukast  (SINGULAIR ) 10 MG tablet [id number] Yes Take 1 tablet (10 mg total) by mouth at bedtime. Kozlow, Eric J, MD  Active Self, Spouse/Significant Other, Pharmacy Records  Olopatadine  HCl (PATADAY ) 0.2 % SOLN [id number] Yes Place 1 drop into both eyes daily as needed. Kozlow, Camellia PARAS, MD  Active Self, Spouse/Significant Other, Pharmacy Records  sirolimus (RAPAMUNE) 2 MG tablet [id number]  Take 2 mg by mouth daily.  Patient not taking: Reported on 03/04/2024   [provider]  Active Self, Spouse/Significant Other, Pharmacy Records  traMADol  (ULTRAM ) 50 MG tablet [id number]  Take 1 tablet (50 mg total) by mouth every 12 (twelve) hours as needed.  Patient not taking: Reported on 03/04/2024   Addie Cordella Hamilton, MD  Consider Medication Status and Discontinue (Side effect (s)) Self, Spouse/Significant Other, Pharmacy Records  valsartan  (DIOVAN ) 80 MG tablet [id number] Yes Take 1 tablet (80 mg total) by mouth daily. Newlin, Enobong, MD  Active Self, Spouse/Significant Other, Pharmacy Records  VENTOLIN  HFA 108 [phone] Base) MCG/ACT inhaler [id number] Yes INHALE 2 PUFFS INTO THE LUNGS UP TO EVERY 4 HOURS AS NEEDED FOR WHEEZING OR SHORTNESS OF BREATH Kozlow, Eric J, MD  Active Self, Spouse/Significant Other, Pharmacy Records  Vitamin D3 (VITAMIN D) 25 MCG tablet [id number]  Take 1,000 Units by mouth daily. [provider]  Active Spouse/Significant Other, Self, Pharmacy Records            Recommendation:   Specialty provider follow-up ortho, surgery scheduled 03/05/24 Continue Current Plan of Care  Follow Up Plan:   Closing From:  Complex Care Management No further follow up required: Patient declines care management follow-up at this time. He has been provided care team contact information for any new issues or concerns.  Rosaline Finlay, RN MSN [place]  [facility]BC[facility] Population Health RN Care Manager Direct Dial: [phone]  Fax:  [phone]

## 2024-03-04 NOTE — Patient Instructions (Signed)
 Visit Information  Jason Stokes was given information about Medicaid Managed Care team care coordination services as a part of their [facility] Community Plan Medicaid benefit.   If you would like to schedule transportation through your [facility], please call the following number at least 2 days in advance of your appointment: [phone]   Rides for urgent appointments can also be made after hours by calling Member Services.  Call the Behavioral Health Crisis Line at [phone], at any time, 24 hours a day, 7 days a week. If you are in danger or need immediate medical attention call 911.   Jason Stokes - following are the goals we discussed in your visit today:   Goals Addressed             This Visit's Progress    COMPLETED: VBCI RN Care Plan   On track    Problems:  Chronic Disease Management support and education needs related to DMII, HTN, and chronic pain  Goal: Over the next 30 days the Patient will attend all scheduled medical appointments: ortho as evidenced by completed visit notes uploaded to EMR        demonstrate Ongoing adherence to prescribed treatment plan for DMII as evidenced by obtaining blood glucose monitoring supplies and monitoring daily, following diabetic diet demonstrate Improved health management independence as evidenced by checking BP 2-3 times a week at home        Report a decrease in pain following upcoming shoulder surgery  Interventions:   Diabetes Interventions: Assessed patient's understanding of A1c goal: <7% Reviewed medications with patient and discussed importance of medication adherence Counseled on importance of regular laboratory monitoring as prescribed Discussed plans with patient for ongoing care management follow up and provided patient with direct contact information for care management team Advised patient, providing education and rationale, to check cbg daily and record, calling PCP for findings outside  established parameters Lab Results  Component Value Date   HGBA1C 5.7 01/31/2024    Evaluation of current treatment plan related to chronic pain self-management and patient's adherence to plan as established by provider. Discussed plans with patient for ongoing care management follow up and provided patient with direct contact information for care management team Evaluation of current treatment plan related to chronic pain and patient's adherence to plan as established by provider Reviewed medications with patient and discussed cannabis use Screening for signs and symptoms of depression related to chronic disease state  Assessed social determinant of health barriers  Hypertension Interventions: Last practice recorded BP readings:  BP Readings from Last 3 Encounters:  02/27/24 (!) 106/99  01/31/24 (!) 145/84  01/30/24 130/88   Most recent eGFR/CrCl:  Lab Results  Component Value Date   EGFR 84 01/31/2024    No components found for: CRCL  Evaluation of current treatment plan related to hypertension self management and patient's adherence to plan as established by provider Reviewed medications with patient and discussed importance of compliance Advised patient, providing education and rationale, to monitor blood pressure daily and record, calling PCP for findings outside established parameters Provided education on prescribed diet low sodium  Patient Self-Care Activities:  Attend all scheduled provider appointments Call provider office for new concerns or questions  Take medications as prescribed   keep appointment with eye doctor check blood sugar at prescribed times: once daily check feet daily for cuts, sores or redness take the blood sugar log to all doctor visits check blood pressure 3 times per week keep  a blood pressure log take blood pressure log to all doctor appointments call doctor for signs and symptoms of high blood pressure take medications for blood pressure  exactly as prescribed  Plan:  No further follow up required: Patient declines care management follow-up at this time. He has been provided care team contact information for any new issues or concerns.             Patient verbalizes understanding of instructions and care plan provided today and agrees to view in MyChart. Active MyChart status and patient understanding of how to access instructions and care plan via MyChart confirmed with patient.     No further follow up required: Please contact the care management team for any new issues or concerns. You can also reach out to your provider directly. Thank you for taking the time to speak with me this morning!  Rosaline Finlay, RN MSN [place]  VBCI Population Health RN Care Manager Direct Dial: [phone]  Fax: [phone]   Following is a copy of your plan of care:  There are no care plans that you recently modified to display for this patient.

## 2024-03-04 NOTE — Progress Notes (Signed)
 Case: [id number] Date/Time: 03/05/24 0715   Procedures:      ARTHROSCOPY, SHOULDER WITH DEBRIDEMENT (Right: Shoulder) - RIGHT SHOULDER ARTHROSCOPY, DEBRIDEMENT, SUBACROMIAL DECOMPRESSION, BICEPS TENODESIS, SUBSCAPULARIS REPAIR-OPEN     REPAIR, SHOULDER, SUBSCAPULAR, OPEN (Right: Shoulder)     TENODESIS, BICEPS (Right)   Anesthesia type: General   Diagnosis: Full thickness tear of right subscapularis tendon, initial encounter [D53.188J]   Pre-op  diagnosis: right shoulder subscapularis tear, biceps tendinitis   [facility] / [facility]   Surgeons: Addie Cordella Hamilton, MD       DISCUSSION: Jason Stokes is a 63 yo male with PMH of current smoking, marijuana use, ETOH abuse, HTN, CAD (by CT), PAF (on Eliquis ), 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 Has had several ablations (2017, last was in 2023). Last seen in EP clinic on 09/22/23. He has been maintaining SR. Had cardiac clearance visit on 02/27/24. Advised continue current medicines. BP controlled. He was cleared:  Preoperative clearance - According to the Revised Cardiac Risk Index (RCRI), his Perioperative Risk of Major Cardiac Event is (%): 0.9. His Functional Capacity in METs is: 4.3 according to the Duke Activity Status Index (DASI). Per AHA/ACC guidelines, he is deemed acceptable risk for the planned procedure without additional cardiovascular testing. Will route to surgical team so they are aware.   Antiplatelet/Anticoagulant: Hold Eliquis  3 days prior to planned procedure. He is aware of this recommendation.  Seen by PCP on 01/31/24 for routine f/u. DM controlled (A1c 5.7). He reported chronic congestion 2/2 allergies  Seen by Allergist on 01/30/24 for severe asthma. He is on inhalers and allergy meds. Stable at that visit.  VS:  Wt  Readings from Last 3 Encounters:  01/31/24 108.5 kg  10/03/23 111.1 kg  09/22/23 114 kg   Temp Readings from Last 3 Encounters:  01/30/24 37 C (Temporal)  08/01/23 37.3 C  07/26/23 (!) 36.3 C   BP Readings from Last 3 Encounters:  02/27/24 (!) 106/99  01/31/24 (!) 145/84  01/30/24 130/88   Pulse Readings from Last 3 Encounters:  01/31/24 84  01/30/24 92  10/03/23 94     PROVIDERS: Delbert Clam, MD   LABS: Obtain DOS   IMAGES:  MRI shoulder 01/10/24:  IMPRESSION: 1. Large full-thickness versus complete tear of the subscapularis tendon with 15 mm of retraction and possible thin intact inferior most fibers. 2. Severe supraspinatus tendinosis with a small shallow partial-thickness bursal surface tear along the anterior-most aspect. 3. Severe infraspinatus tendinosis with a small shallow partial-thickness bursal surface tear towards the insertion. 4. High-grade partial-thickness tear of the proximal long head of the biceps tendon.  EKG 09/22/23  Normal sinus rhythm, rate 74 Normal ECG When compared with ECG of 23-Mar-2023 08:38, T wave inversion no longer evident in Inferior leads  CV: Cardiac CT 04/21/2022:  IMPRESSION: 1. There is normal pulmonary vein drainage into the left atrium with ostial measurements above.   2. There is no thrombus in the left atrial appendage.   3. The esophagus runs in the left atrial midline and is not in proximity to any of the pulmonary vein ostia.   4. No PFO/ASD.   5. Normal coronary origin. Right dominance.   6. CAC score of 20.6 Agatston units which is 61st percentile for age-, race-, and sex-matched controls.  Echo 01/13/2021:  IMPRESSIONS  1. Left ventricular ejection fraction, by estimation, is 60 to 65%. The  left ventricle has normal function. The left ventricle has no regional  wall motion abnormalities. There is moderate left ventricular hypertrophy.  Left ventricular diastolic  parameters are  consistent with Grade I diastolic dysfunction (impaired  relaxation). Elevated left ventricular end-diastolic pressure. The E/e' is  16.   2. Right ventricular systolic function is normal. The right ventricular  size is normal.   3. The mitral valve is grossly normal. Trivial mitral valve  regurgitation.   4. The aortic valve is tricuspid. Aortic valve regurgitation is not  visualized.   5. Aortic dilatation noted. There is borderline dilatation of the aortic  root, measuring 38 mm. There is mild dilatation of the ascending aorta,  measuring 39 mm.   6. The inferior vena cava is normal in size with greater than 50%  respiratory variability, suggesting right atrial pressure of 3 mmHg.   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)    a.) s/p embolization 08/19/2020: 29 mm RIGHT internal iliac artery aneurysm   Anxiety    Aortic atherosclerosis (HCC)    Bilateral carpal tunnel syndrome 01/10/2018   Bipolar disorder (HCC)    Chorioretinal inflammation of both eyes    a.) on azothioprine   Chronic lower back pain    Coronary artery calcification seen on CT scan    a.) cCTA 04/21/2022: Ca score 20.6 (61st percentile for age/sex match control)   DDD (degenerative disc disease), cervical    Depression    Diastolic dysfunction    a.) TTE 01/13/2021: EF 60-65%, mod LVH, triv MR, G1DD   GERD (gastroesophageal reflux disease)    Hepatic steatosis    History of alcohol abuse    History of nuclear stress test    Myoview 10/16: EF 50%, diaphragmatic attenuation, no ischemia, low risk   Hypertension    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   a.) CT head 12/25/2012 --> RIGHT basal ganglia hypoattenuation related to remote lacunar infarct   Lipoma    Long term (current) use of anticoagulants    a.) apixaban    Long-term current use of immunomodulator    a.) on azothioprine for peripheral focal chorioretinal inflammation (both eyes)    Marijuana use    Migraine    Moderate persistent asthma with acute exacerbation 05/02/2018   PAF (paroxysmal atrial fibrillation) (HCC) 03/27/2015   a.) CHA2DS2VASc = 5 (HTN, CVA x2, vascular disease history, T2DM);  b.) s/p ablation 10/02/2015; c.) s/p ablation 12/10/2015; d.) s/p DCCV (200 J x 1) 12/11/2015; e.) s/p ablation 04/28/2022; f.) rate/rhythm maintained on oral diltiazem  + carvedilol ; chronically anticoagulated with apixaban    Pilonidal cyst    Schizophrenia (HCC)    Sciatica neuralgia    T2DM (type 2 diabetes mellitus) (HCC)    Thoracic aortic ectasia (HCC) 01/13/2021   a.) TTE 01/13/2021: Ao root 38 mm, asc Ao 39 mm    Past Surgical History:  Procedure Laterality Date   ATRIAL FIBRILLATION ABLATION  09/22/2015   ATRIAL FIBRILLATION ABLATION N/A 04/28/2022   Procedure: ATRIAL FIBRILLATION ABLATION;  Surgeon: Inocencio Soyla Lunger, MD;  [facility];  Service: Cardiovascular;  Laterality: N/A;   CATARACT EXTRACTION Bilateral    CYST EXCISION  1996-97   surgery back of head    ELECTROPHYSIOLOGIC STUDY N/A 09/22/2015   Procedure: Atrial Fibrillation Ablation;  Surgeon: Will Lunger Inocencio, MD;  Location: [facility] INVASIVE  CV LAB;  Service: Cardiovascular;  Laterality: N/A;   ELECTROPHYSIOLOGIC STUDY N/A 12/10/2015   Procedure: Atrial Fibrillation Ablation;  Surgeon: Will Gladis Norton, MD;  [facility];  Service: Cardiovascular;  Laterality: N/A;   ELECTROPHYSIOLOGIC STUDY N/A 12/11/2015   Procedure: Cardioversion;  Surgeon: Will Gladis Norton, MD;  [facility];  Service: Cardiovascular;  Laterality: N/A;   EMBOLIZATION (CATH LAB) Right 08/19/2020   Procedure: EMBOLIZATION;  Surgeon: Magda Debby SAILOR, MD;  [facility];  Service: Cardiovascular;  Laterality: Right;  hypogastric   EXCISION MASS HEAD N/A 01/06/2017   Procedure: EXCISION MASS FOREHEAD;  Surgeon: Arelia Filippo, MD;  Location: [facility];  Service: Plastics;  Laterality: N/A;   EXCISION MASS UPPER EXTREMETIES Right 08/08/2022   Procedure: EXCISION MASS RIGHT FOREARM;  Surgeon: Murrell Drivers, MD;  Location: [facility];  Service: Orthopedics;  Laterality: Right;  45 MIN   GANGLION CYST EXCISION Left    INTERCOSTAL NERVE BLOCK  07/19/2003   KNEE ARTHROSCOPY Right 07/18/2014   MASS EXCISION N/A 01/25/2021   Procedure: EXCISION SUBCUTANEOUS VS SUBFASCIAL MASS TORSO 3CM;  Surgeon: Arelia Filippo, MD;  Location: [facility];  Service: Plastics;  Laterality: N/A;   PILONIDAL CYST EXCISION N/A 09/13/2022   Procedure: CYST EXCISION PILONIDAL EXTENSIVE;  Surgeon: Desiderio Schanz, MD;  Location: [facility]S;  Service: General;  Laterality: N/A;   SHOULDER ARTHROSCOPY WITH ROTATOR CUFF REPAIR AND OPEN BICEPS TENODESIS Left 07/26/2023   Procedure: LEFT SHOULDER ARTHROSCOPY WITH OPEN ROTATOR CUFF REPAIR AND OPEN BICEPS TENODESIS;  Surgeon: Barbarann Oneil BROCKS, MD;  Location:  SURGERY CENTER;  Service: Orthopedics;  Laterality: Left;    MEDICATIONS: No current facility-administered medications for this encounter.    atorvastatin  (LIPITOR) 40 MG tablet   azelastine  (ASTELIN ) 0.1 % nasal spray   brimonidine (ALPHAGAN) 0.2 % ophthalmic solution   cetirizine  (ZYRTEC ) 10 MG tablet   cromolyn  (OPTICROM ) 4 % ophthalmic solution   dorzolamide-timolol (COSOPT) 22.3-6.8 MG/ML ophthalmic solution   ELIQUIS  5 MG TABS tablet   EPINEPHrine  0.3 mg/0.3 mL IJ SOAJ injection   fluticasone  (FLONASE ) 50 MCG/ACT nasal spray   fluticasone  furoate-vilanterol (BREO ELLIPTA ) 200-25 MCG/ACT AEPB   folic acid (FOLVITE) 1 MG tablet   GARLIC PO   ipratropium (ATROVENT ) 0.03 % nasal spray   latanoprost (XALATAN) 0.005 % ophthalmic solution   methocarbamol  (ROBAXIN ) 500 MG tablet   mometasone-formoterol (DULERA ) 200-5 MCG/ACT AERO   montelukast  (SINGULAIR ) 10 MG tablet   Olopatadine  HCl (PATADAY ) 0.2 % SOLN   sirolimus  (RAPAMUNE) 2 MG tablet   traMADol  (ULTRAM ) 50 MG tablet   valsartan  (DIOVAN ) 80 MG tablet   VENTOLIN  HFA 108 (90 Base) MCG/ACT inhaler   Vitamin D3 (VITAMIN D) 25 MCG tablet   ACCU-CHEK GUIDE TEST test strip   Accu-Chek Softclix Lancets lancets   DULoxetine  (CYMBALTA ) 60 MG capsule   furosemide  (LASIX ) 20 MG tablet   Misc. Devices MISC   Misc. Devices MISC   Burnard CHRISTELLA Odis DEVONNA [facility] Surgical Short Stay/Anesthesiology [facility] Phone [phone] 03/04/2024 10:19 AM

## 2024-03-04 NOTE — Progress Notes (Signed)
 SDW CALL  Patient was given pre-op  instructions over the phone. The opportunity was given for the patient to ask questions. No further questions asked. Patient verbalized understanding of instructions given.   PCP - Dr. Marijo Sabin Cardiologist - Dr. Soyla Norton  PPM/ICD - denies   Chest x-ray - denies EKG - 09/22/23 Stress Test - 2018 ECHO - 01/13/21 Cardiac Cath - denies  Sleep Study - denies  Fasting Blood Sugar - unknown - does not check blood sugars at this time    Last dose of GLP1 agonist-  n/a GLP1 instructions:  n/a  Blood Thinner Instructions: last dose of Eliquis  was 8/15 - instructed to hold for 3 days prior to surgery Aspirin  Instructions: n/a  ERAS Protcol - clears until 0430 PRE-SURGERY Ensure or G2- n/a  COVID TEST- n/a   Anesthesia review: yes  Patient denies shortness of breath, fever, cough and chest pain over the phone call   All instructions explained to the patient, with a verbal understanding of the material. Patient agrees to go over the instructions while at home for a better understanding.

## 2024-03-05 ENCOUNTER — Ambulatory Visit (HOSPITAL_COMMUNITY): Admission: RE | Admit: 2024-03-05 | Source: Home / Self Care | Admitting: Orthopedic Surgery

## 2024-03-05 DIAGNOSIS — Z01818 Encounter for other preprocedural examination: Secondary | ICD-10-CM

## 2024-03-05 DIAGNOSIS — I639 Cerebral infarction, unspecified: Secondary | ICD-10-CM | POA: Diagnosis not present

## 2024-03-05 SURGERY — ARTHROSCOPY, SHOULDER WITH DEBRIDEMENT
Anesthesia: General | Site: Shoulder | Laterality: Right

## 2024-03-06 ENCOUNTER — Other Ambulatory Visit: Payer: Self-pay

## 2024-03-06 DIAGNOSIS — I639 Cerebral infarction, unspecified: Secondary | ICD-10-CM | POA: Diagnosis not present

## 2024-03-06 MED ORDER — EPINEPHRINE 0.3 MG/0.3ML IJ SOAJ: 0.3000 mg | INTRAMUSCULAR | 1 refills | Status: AC | PRN

## 2024-03-07 DIAGNOSIS — I639 Cerebral infarction, unspecified: Secondary | ICD-10-CM | POA: Diagnosis not present

## 2024-03-08 ENCOUNTER — Telehealth: Payer: Self-pay | Admitting: Orthopedic Surgery

## 2024-03-08 ENCOUNTER — Other Ambulatory Visit: Payer: Self-pay | Admitting: Surgical

## 2024-03-08 DIAGNOSIS — I639 Cerebral infarction, unspecified: Secondary | ICD-10-CM | POA: Diagnosis not present

## 2024-03-08 MED ORDER — ACETAMINOPHEN-CODEINE 300-30 MG PO TABS
1.0000 | ORAL_TABLET | Freq: Every evening | ORAL | 0 refills | Status: DC | PRN
Start: 2024-03-08 — End: 2024-04-02

## 2024-03-08 NOTE — Telephone Encounter (Signed)
 Patient called and said can he have something for pain for his shoulder. He stated he can't sleep at night. CB#[phone]

## 2024-03-08 NOTE — Telephone Encounter (Signed)
 Sent in one time prescription for tylenol  #3 to take and must last until he gets back on schedule, no refills

## 2024-03-09 DIAGNOSIS — I639 Cerebral infarction, unspecified: Secondary | ICD-10-CM | POA: Diagnosis not present

## 2024-03-10 DIAGNOSIS — I639 Cerebral infarction, unspecified: Secondary | ICD-10-CM | POA: Diagnosis not present

## 2024-03-11 DIAGNOSIS — I639 Cerebral infarction, unspecified: Secondary | ICD-10-CM | POA: Diagnosis not present

## 2024-03-12 DIAGNOSIS — I639 Cerebral infarction, unspecified: Secondary | ICD-10-CM | POA: Diagnosis not present

## 2024-03-13 ENCOUNTER — Encounter: Admitting: Orthopedic Surgery

## 2024-03-13 DIAGNOSIS — I639 Cerebral infarction, unspecified: Secondary | ICD-10-CM | POA: Diagnosis not present

## 2024-03-14 ENCOUNTER — Other Ambulatory Visit: Payer: Self-pay | Admitting: Allergy and Immunology

## 2024-03-15 ENCOUNTER — Ambulatory Visit

## 2024-03-15 DIAGNOSIS — J309 Allergic rhinitis, unspecified: Secondary | ICD-10-CM | POA: Diagnosis not present

## 2024-03-17 DIAGNOSIS — I639 Cerebral infarction, unspecified: Secondary | ICD-10-CM | POA: Diagnosis not present

## 2024-03-18 DIAGNOSIS — I639 Cerebral infarction, unspecified: Secondary | ICD-10-CM | POA: Diagnosis not present

## 2024-03-19 DIAGNOSIS — I639 Cerebral infarction, unspecified: Secondary | ICD-10-CM | POA: Diagnosis not present

## 2024-03-20 DIAGNOSIS — I639 Cerebral infarction, unspecified: Secondary | ICD-10-CM | POA: Diagnosis not present

## 2024-03-21 ENCOUNTER — Telehealth: Payer: Self-pay

## 2024-03-21 ENCOUNTER — Encounter: Payer: Self-pay | Admitting: Cardiology

## 2024-03-21 DIAGNOSIS — I639 Cerebral infarction, unspecified: Secondary | ICD-10-CM | POA: Diagnosis not present

## 2024-03-21 NOTE — Telephone Encounter (Signed)
 Marval said that you wanted a P2P set up on this patient. It is scheduled for tomorrow @ 10:30am  Service Reference Number: [id number] Member's Name: Jason Stokes Provider's Name: CORDELLA HUTCHINSON Date and Time of Peer to Peer: 03/22/2024 10:30 AM ET [facility]) / 10:30 AM ET (Provider)

## 2024-03-22 DIAGNOSIS — I639 Cerebral infarction, unspecified: Secondary | ICD-10-CM | POA: Diagnosis not present

## 2024-03-23 DIAGNOSIS — I639 Cerebral infarction, unspecified: Secondary | ICD-10-CM | POA: Diagnosis not present

## 2024-03-24 DIAGNOSIS — I639 Cerebral infarction, unspecified: Secondary | ICD-10-CM | POA: Diagnosis not present

## 2024-03-25 DIAGNOSIS — I639 Cerebral infarction, unspecified: Secondary | ICD-10-CM | POA: Diagnosis not present

## 2024-03-26 DIAGNOSIS — I639 Cerebral infarction, unspecified: Secondary | ICD-10-CM | POA: Diagnosis not present

## 2024-03-27 DIAGNOSIS — I639 Cerebral infarction, unspecified: Secondary | ICD-10-CM | POA: Diagnosis not present

## 2024-03-28 ENCOUNTER — Telehealth: Payer: Self-pay

## 2024-03-28 ENCOUNTER — Ambulatory Visit (INDEPENDENT_AMBULATORY_CARE_PROVIDER_SITE_OTHER)

## 2024-03-28 DIAGNOSIS — J309 Allergic rhinitis, unspecified: Secondary | ICD-10-CM

## 2024-03-28 DIAGNOSIS — I639 Cerebral infarction, unspecified: Secondary | ICD-10-CM | POA: Diagnosis not present

## 2024-03-28 NOTE — Telephone Encounter (Signed)
 LMN needed

## 2024-03-29 DIAGNOSIS — I639 Cerebral infarction, unspecified: Secondary | ICD-10-CM | POA: Diagnosis not present

## 2024-03-29 NOTE — Telephone Encounter (Signed)
 Jason Stokes is a 63 year old active patient with right shoulder pain.  He has a complete tear of the subscapularis with 15 mm of retraction 3 months ago.  That is likely increased in size.  He also has biceps tearing at its proximal aspect.  His AC joint arthritis seen on the MRI scan is not symptomatic.  In order to prevent developing rotator cuff arthropathy from his subscapularis tear as well as severe supraspinatus tendinosis arthroscopy with biceps tendon release and biceps tenodesis and subscapularis tendon repair as indicated.  In my experience the subscapularis tears tend to retract more quickly than the other supraspinatus and infraspinatus tendon tears.  In my opinion it is a medical necessity that he undergo surgery now potentially to prevent shoulder replacement for rotator cuff arthropathy in the future.

## 2024-03-30 DIAGNOSIS — I639 Cerebral infarction, unspecified: Secondary | ICD-10-CM | POA: Diagnosis not present

## 2024-03-31 DIAGNOSIS — I639 Cerebral infarction, unspecified: Secondary | ICD-10-CM | POA: Diagnosis not present

## 2024-04-01 DIAGNOSIS — I639 Cerebral infarction, unspecified: Secondary | ICD-10-CM | POA: Diagnosis not present

## 2024-04-02 ENCOUNTER — Telehealth: Payer: Self-pay | Admitting: Orthopedic Surgery

## 2024-04-02 ENCOUNTER — Other Ambulatory Visit: Payer: Self-pay | Admitting: Surgical

## 2024-04-02 DIAGNOSIS — J3081 Allergic rhinitis due to animal (cat) (dog) hair and dander: Secondary | ICD-10-CM | POA: Diagnosis not present

## 2024-04-02 DIAGNOSIS — I639 Cerebral infarction, unspecified: Secondary | ICD-10-CM | POA: Diagnosis not present

## 2024-04-02 DIAGNOSIS — J302 Other seasonal allergic rhinitis: Secondary | ICD-10-CM | POA: Diagnosis not present

## 2024-04-02 MED ORDER — ACETAMINOPHEN-CODEINE 300-30 MG PO TABS: 1.0000 | ORAL_TABLET | Freq: Every evening | ORAL | 0 refills | Status: DC | PRN

## 2024-04-02 NOTE — Progress Notes (Signed)
 VIALS MADE 04-02-24

## 2024-04-02 NOTE — Telephone Encounter (Signed)
 Pt called stating that his shoulder is still hurting and asking for tylenol  3. Please send  to pharmacy on file. Pt is asking for a call for next plan of care about his shoulder. Pt phone number is [phone].

## 2024-04-02 NOTE — Telephone Encounter (Signed)
 I will refill due to difficulties with getting on surgery schedule due to insurance company. Looks like latest update is CPT [phone] (diagnostic arthroscopy of shoulder) is not approved as of 04/01/24

## 2024-04-03 DIAGNOSIS — I639 Cerebral infarction, unspecified: Secondary | ICD-10-CM | POA: Diagnosis not present

## 2024-04-03 DIAGNOSIS — J301 Allergic rhinitis due to pollen: Secondary | ICD-10-CM | POA: Diagnosis not present

## 2024-04-03 DIAGNOSIS — J3089 Other allergic rhinitis: Secondary | ICD-10-CM | POA: Diagnosis not present

## 2024-04-03 NOTE — Telephone Encounter (Signed)
 Lvm advising pt.

## 2024-04-04 DIAGNOSIS — I639 Cerebral infarction, unspecified: Secondary | ICD-10-CM | POA: Diagnosis not present

## 2024-04-05 ENCOUNTER — Ambulatory Visit (INDEPENDENT_AMBULATORY_CARE_PROVIDER_SITE_OTHER)

## 2024-04-05 DIAGNOSIS — J309 Allergic rhinitis, unspecified: Secondary | ICD-10-CM

## 2024-04-05 DIAGNOSIS — I639 Cerebral infarction, unspecified: Secondary | ICD-10-CM | POA: Diagnosis not present

## 2024-04-06 DIAGNOSIS — I639 Cerebral infarction, unspecified: Secondary | ICD-10-CM | POA: Diagnosis not present

## 2024-04-07 DIAGNOSIS — I639 Cerebral infarction, unspecified: Secondary | ICD-10-CM | POA: Diagnosis not present

## 2024-04-08 DIAGNOSIS — I639 Cerebral infarction, unspecified: Secondary | ICD-10-CM | POA: Diagnosis not present

## 2024-04-08 NOTE — Telephone Encounter (Signed)
 Okay thanks very much Amy

## 2024-04-09 DIAGNOSIS — I639 Cerebral infarction, unspecified: Secondary | ICD-10-CM | POA: Diagnosis not present

## 2024-04-10 ENCOUNTER — Ambulatory Visit: Admitting: Dermatology

## 2024-04-10 ENCOUNTER — Encounter: Payer: Self-pay | Admitting: Dermatology

## 2024-04-10 VITALS — BP 149/95

## 2024-04-10 DIAGNOSIS — I639 Cerebral infarction, unspecified: Secondary | ICD-10-CM | POA: Diagnosis not present

## 2024-04-10 DIAGNOSIS — L810 Postinflammatory hyperpigmentation: Secondary | ICD-10-CM

## 2024-04-10 DIAGNOSIS — L700 Acne vulgaris: Secondary | ICD-10-CM | POA: Diagnosis not present

## 2024-04-10 DIAGNOSIS — L739 Follicular disorder, unspecified: Secondary | ICD-10-CM | POA: Diagnosis not present

## 2024-04-10 MED ORDER — DOXYCYCLINE HYCLATE 100 MG PO TABS
100.0000 mg | ORAL_TABLET | Freq: Every day | ORAL | 0 refills | Status: DC
Start: 2024-04-10 — End: 2024-06-03

## 2024-04-10 MED ORDER — CLINDAMYCIN PHOSPHATE 1 % EX SWAB: 1.0000 | Freq: Every day | CUTANEOUS | 4 refills | Status: AC

## 2024-04-10 MED ORDER — TRETINOIN 0.05 % EX CREA: TOPICAL_CREAM | CUTANEOUS | 0 refills | Status: DC

## 2024-04-10 NOTE — Patient Instructions (Addendum)
 Date: Wed Apr 10 2024  Hello Jason Stokes,  Thank you for visiting today. Here is a summary of the key instructions:  - Medications:   - Use 4% benzoyl peroxide wash (CeraVe brand) on your face and body   - Apply clindamycin  swabs to your face, bottom, and back after washing   - Use tretinoin  0.05% cream at night - apply a pea-sized amount to your face every other night to avoid dryness   - Mix tretinoin  with lotion when applying to your back and bottom   - If your skin gets too dry, use tretinoin  every two nights instead   - Take doxycycline  100 mg at dinner time with food to avoid upset stomach  - Skin Care:   - Apply lotion like CeraVe after washing to keep skin hydrated without clogging pores   - Let your skin tell you how much tretinoin  you can tolerate  - Follow-up:   - Return appointment in 5 months to check your progress   - Treatment may be adjusted if needed  - Other Instructions:   - Expect to see significant improvement in about 4 weeks   - More improvement expected after another 4 weeks   - Samples of wash and moisturizer will be provided to get you started   - You can buy these products at [employer] or [employer]  Please reach out if you have any questions or concerns.  Warm regards,  Dr. Delon Lenis Dermatology    Doxycycline  should be taken with food to prevent nausea. Do not lay down for 30 minutes after taking. Be cautious with sun exposure and use good sun protection while on this medication. Pregnant women should not take this medication.     Topical retinoid medications like tretinoin /Retin-A , adapalene/Differin, tazarotene/Fabior, and Epiduo/Epiduo Forte can cause dryness and irritation when first started. Only apply a pea-sized amount to the entire affected area. Avoid applying it around the eyes, edges of mouth and creases at the nose. If you experience irritation, use a good moisturizer first and/or apply the medicine less often. If you are doing well with the  medicine, you can increase how often you use it until you are applying every night. Be careful with sun protection while using this medication as it can make you sensitive to the sun. This medicine should not be used by pregnant women.    Important Information  Due to recent changes in healthcare laws, you may see results of your pathology and/or laboratory studies on MyChart before the doctors have had a chance to review them. We understand that in some cases there may be results that are confusing or concerning to you. Please understand that not all results are received at the same time and often the doctors may need to interpret multiple results in order to provide you with the best plan of care or course of treatment. Therefore, we ask that you please give us  2 business days to thoroughly review all your results before contacting the office for clarification. Should we see a critical lab result, you will be contacted sooner.   If You Need Anything After Your Visit  If you have any questions or concerns for your doctor, please call our main line at [phone] If no one answers, please leave a voicemail as directed and we will return your call as soon as possible. Messages left after 4 pm will be answered the following business day.   You may also send us  a message via MyChart. We  typically respond to MyChart messages within 1-2 business days.  For prescription refills, please ask your pharmacy to contact our office. Our fax number is [phone].  If you have an urgent issue when the clinic is closed that cannot wait until the next business day, you can page your doctor at the number below.    Please note that while we do our best to be available for urgent issues outside of office hours, we are not available 24/7.   If you have an urgent issue and are unable to reach us , you may choose to seek medical care at your doctor's office, retail clinic, urgent care center, or emergency room.  If  you have a medical emergency, please immediately call 911 or go to the emergency department. In the event of inclement weather, please call our main line at [phone] for an update on the status of any delays or closures.  Dermatology Medication Tips: Please keep the boxes that topical medications come in in order to help keep track of the instructions about where and how to use these. Pharmacies typically print the medication instructions only on the boxes and not directly on the medication tubes.   If your medication is too expensive, please contact our office at [phone] or send us  a message through MyChart.   We are unable to tell what your co-pay for medications will be in advance as this is different depending on your insurance coverage. However, we may be able to find a substitute medication at lower cost or fill out paperwork to get insurance to cover a needed medication.   If a prior authorization is required to get your medication covered by your insurance company, please allow us  1-2 business days to complete this process.  Drug prices often vary depending on where the prescription is filled and some pharmacies may offer cheaper prices.  The website www.goodrx.com contains coupons for medications through different pharmacies. The prices here do not account for what the cost may be with help from insurance (it may be cheaper with your insurance), but the website can give you the price if you did not use any insurance.  - You can print the associated coupon and take it with your prescription to the pharmacy.  - You may also stop by our office during regular business hours and pick up a GoodRx coupon card.  - If you need your prescription sent electronically to a different pharmacy, notify our office through [facility] or by phone at [phone]

## 2024-04-10 NOTE — Progress Notes (Signed)
   New Patient Visit   Subjective  Jason Stokes is a 63 y.o. male who presents for the following: He had a cyst removed from his buttocks about a year ago. Ever since he had that surgery he has been getting breakouts of bumps on his face, scalp, back and buttocks. He is having new bumps come up every couple of months. He is treating them with alcohol after showering and they dry up. He washes with Dial antibacterial soap.  Accompanied by fiance today.  The following portions of the chart were reviewed this encounter and updated as appropriate: medications, allergies, medical history  Review of Systems:  No other skin or systemic complaints except as noted in HPI or Assessment and Plan.  Objective  Well appearing patient in no apparent distress; mood and affect are within normal limits.   A focused examination was performed of the following areas:    Relevant exam findings are noted in the Assessment and Plan.                Assessment & Plan    1. Acne Vulgaris with Folliculitis and PIH - Assessment: Patient reports acne breakouts on face, back, and buttocks that occur monthly and move to different areas. Examination reveals induration and inflammatory papules on the face, hyperpigmented macules and inflammatory papules on the back, and folliculitis on the buttocks. Patient has not previously used any prescription treatments for this condition. The presentation is consistent with acne vulgaris with associated folliculitis. - Plan:    Benzoyl peroxide wash for face and body    Clindamycin  swabs for face, bottom, and back after washing    CeraVe lotion for skin hydration    Tretinoin  0.05% at night - face: apply pea-sized amount every other night; back and bottom: mix with lotion and apply every other night    Adjust frequency to every two nights if skin becomes too dry    Doxycycline  100 mg with dinner daily    Provide samples of wash and moisturizer  Follow-up in 5  months to assess progress and adjust treatment if needed.      Return in about 4 months (around 08/10/2024) for Acne.  I, Roseline Hutchinson, CMA, am acting as scribe for [employer], DO .    Documentation: I have reviewed the above documentation for accuracy and completeness, and I agree with the above.  Delon Lenis, DO

## 2024-04-11 DIAGNOSIS — I639 Cerebral infarction, unspecified: Secondary | ICD-10-CM | POA: Diagnosis not present

## 2024-04-12 DIAGNOSIS — I639 Cerebral infarction, unspecified: Secondary | ICD-10-CM | POA: Diagnosis not present

## 2024-04-13 DIAGNOSIS — I639 Cerebral infarction, unspecified: Secondary | ICD-10-CM | POA: Diagnosis not present

## 2024-04-14 DIAGNOSIS — I639 Cerebral infarction, unspecified: Secondary | ICD-10-CM | POA: Diagnosis not present

## 2024-04-15 DIAGNOSIS — I639 Cerebral infarction, unspecified: Secondary | ICD-10-CM | POA: Diagnosis not present

## 2024-04-16 DIAGNOSIS — I639 Cerebral infarction, unspecified: Secondary | ICD-10-CM | POA: Diagnosis not present

## 2024-04-17 DIAGNOSIS — I639 Cerebral infarction, unspecified: Secondary | ICD-10-CM | POA: Diagnosis not present

## 2024-04-17 NOTE — Telephone Encounter (Signed)
 Patient has been scheduled for 04-23-24 @ 07:50 am/. Appt confirmed w/ pt

## 2024-04-18 DIAGNOSIS — I639 Cerebral infarction, unspecified: Secondary | ICD-10-CM | POA: Diagnosis not present

## 2024-04-19 DIAGNOSIS — I639 Cerebral infarction, unspecified: Secondary | ICD-10-CM | POA: Diagnosis not present

## 2024-04-19 NOTE — Progress Notes (Addendum)
 Surgical Instructions   Your procedure is scheduled on April 25, 2024. Report to [facility] Main Entrance A at 9:00 A.M., then check in with the Admitting office. Any questions or running late day of surgery: call [phone]  Questions prior to your surgery date: call [phone], Monday-Friday, 8am-4pm. If you experience any cold or flu symptoms such as cough, fever, chills, shortness of breath, etc. between now and your scheduled surgery, please notify us  at the above number.     Remember:  Do not eat after midnight the night before your surgery  You may drink clear liquids until 8:00 the morning of your surgery.   Clear liquids allowed are: Water, Non-Citrus Juices (without pulp), Carbonated Beverages, Clear Tea (no milk, honey, etc.), Black Coffee Only (NO MILK, CREAM OR POWDERED CREAMER of any kind), and Gatorade.     The night before surgery:  No food after midnight. ONLY clear liquids after midnight   The day of surgery (if you have diabetes): Drink ONE (1) 12 oz G2 given to you in your pre admission testing appointment by 8:00 the morning of surgery. Drink in one sitting. Do not sip.  This drink was given to you during your hospital  pre-op  appointment visit.  Nothing else to drink after completing the  12 oz bottle of G2.         If you have questions, please contact your surgeon's office.  Take these medicines the morning of surgery with A SIP OF WATER  azelastine  (ASTELIN )  brimonidine (ALPHAGAN)  dorzolamide-timolol (COSOPT)  doxycycline  (VIBRA -TABS)  DULoxetine  (CYMBALTA )    May take these medicines IF NEEDED: acetaminophen -codeine  (TYLENOL  #3)  albuterol  (VENTOLIN  HFA) inhaler  atorvastatin  (LIPITOR)  cetirizine  (ZYRTEC )  EPINEPHrine  injection  fluticasone  (FLONASE )  fluticasone  furoate-vilanterol (BREO ELLIPTA )  ipratropium (ATROVENT ) 0.03 % nasal spray  methocarbamol  (ROBAXIN )  mometasone-formoterol (DULERA )  Olopatadine  HCl (PATADAY )    ELIQUIS  Hold 3 days prior to procedure. Last dose April 21, 2024   One week prior to surgery, STOP taking any Aspirin  (unless otherwise instructed by your surgeon) Aleve, Naproxen, Ibuprofen , Motrin , Advil , Goody's, BC's, all herbal medications, fish oil, and non-prescription vitamins.                     Do NOT Smoke (Tobacco/Vaping) for 24 hours prior to your procedure.  If you use a CPAP at night, you may bring your mask/headgear for your overnight stay.   You will be asked to remove any contacts, glasses, piercing's, hearing aid's, dentures/partials prior to surgery. Please bring cases for these items if needed.    Patients discharged the day of surgery will not be allowed to drive home, and someone needs to stay with them for 24 hours.  SURGICAL WAITING ROOM VISITATION Patients may have no more than 2 support people in the waiting area - these visitors may rotate.   Pre-op  nurse will coordinate an appropriate time for 1 ADULT support person, who may not rotate, to accompany patient in pre-op .  Children under the age of 76 must have an adult with them who is not the patient and must remain in the main waiting area with an adult.  If the patient needs to stay at the hospital during part of their recovery, the visitor guidelines for inpatient rooms apply.  Please refer to the [facility] website for the visitor guidelines for any additional information.   If you received a COVID test during your pre-op  visit  it is requested that you  wear a mask when out in public, stay away from anyone that may not be feeling well and notify your surgeon if you develop symptoms. If you have been in contact with anyone that has tested positive in the last 10 days please notify you surgeon.      Pre-operative 4 CHG Bathing Instructions   You can play a key role in reducing the risk of infection after surgery. Your skin needs to be as free of germs as possible. You can reduce the number of germs  on your skin by washing with CHG (chlorhexidine  gluconate) soap before surgery. CHG is an antiseptic soap that kills germs and continues to kill germs even after washing.   DO NOT use if you have an allergy to chlorhexidine /CHG or antibacterial soaps. If your skin becomes reddened or irritated, stop using the CHG and notify one of our RNs at [phone].   Please shower with the CHG soap starting 4 days before surgery using the following schedule:     Please keep in mind the following:  DO NOT shave, including legs and underarms, starting the day of your first shower.   You may shave your face at any point before/day of surgery.  Place clean sheets on your bed the day you start using CHG soap. Use a clean washcloth (not used since being washed) for each shower. DO NOT sleep with pets once you start using the CHG.   CHG Shower Instructions:  Wash your face and private area with normal soap. If you choose to wash your hair, wash first with your normal shampoo.  After you use shampoo/soap, rinse your hair and body thoroughly to remove shampoo/soap residue.  Turn the water OFF and apply  bottle of CHG soap to a CLEAN washcloth.  Apply CHG soap ONLY FROM YOUR NECK DOWN TO YOUR TOES (washing for 3-5 minutes)  DO NOT use CHG soap on face, private areas, open wounds, or sores.  Pay special attention to the area where your surgery is being performed.  If you are having back surgery, having someone wash your back for you may be helpful. Wait 2 minutes after CHG soap is applied, then you may rinse off the CHG soap.  Pat dry with a clean towel  Put on clean clothes/pajamas   If you choose to wear lotion, please use ONLY the CHG-compatible lotions that are listed below.  Additional instructions for the day of surgery:  If you choose, you may shower the morning of surgery with an antibacterial soap.  DO NOT APPLY any lotions, deodorants, cologne, or perfumes.   Do not bring valuables to the  hospital. [facility] is not responsible for any belongings/valuables. Do not wear nail polish, gel polish, artificial nails, or any other type of covering on natural nails (fingers and toes) Do not wear jewelry or makeup Put on clean/comfortable clothes.  Please brush your teeth.  Ask your nurse before applying any prescription medications to the skin.                                   [facility]- Preparing for Total Shoulder Arthroplasty   Before surgery, you can play an important role. Because skin is not sterile, your skin needs to be as free of germs as possible. You can reduce the number of germs on your skin by using the following products. Benzoyl Peroxide Gel Reduces the number of germs present  on the skin Applied twice a day to shoulder area starting two days before surgery   Chlorhexidine  Gluconate (CHG) Soap An antiseptic cleaner that kills germs and bonds with the skin to continue killing germs even after washing Used for showering the night before surgery and morning of surgery   Oral Hygiene is also important to reduce your risk of infection.                                    Remember - BRUSH YOUR TEETH THE MORNING OF SURGERY WITH YOUR REGULAR TOOTHPASTE  ==================================================================  Please follow these instructions carefully:  BENZOYL PEROXIDE 5% GEL  Please do not use if you have an allergy to benzoyl peroxide.   If your skin becomes reddened/irritated stop using the benzoyl peroxide.  Starting two days before surgery, apply as follows: Apply benzoyl peroxide in the morning and at night. Apply after taking a shower. If you are not taking a shower clean entire shoulder front, back, and side along with the armpit with a clean wet washcloth.  Place a quarter-sized dollop on your shoulder and rub in thoroughly, making sure to cover the front, back, and side of your shoulder, along with the armpit.   2 days before ____ AM   ____ PM               1 day before ____ AM   ____ PM                             Do this twice a day for two days.  (Last application is the night before surgery, AFTER using the CHG soap as described below).  Do NOT apply benzoyl peroxide gel on the day of surgery.  CHLORHEXIDINE  GLUCONATE (CHG) SOAP  Please do not use if you have an allergy to CHG or antibacterial soaps. If your skin becomes reddened/irritated stop using the CHG.   Do not shave (including legs and underarms) for at least 48 hours prior to first CHG shower. It is OK to shave your face.  Starting the night before surgery, use CHG soap as follows:  Shower the NIGHT BEFORE SURGERY and MORNING OF SURGERY with CHG.  If you choose to wash your hair, wash your hair first as usual with your normal shampoo.  After shampooing, rinse your hair and body thoroughly to remove the shampoo.  Use CHG as you would any other liquid soap.  You can apply CHG directly to the skin and wash gently with a scrungie or a clean washcloth.  Apply the CHG soap to your body ONLY FROM THE NECK DOWN.  Do not use on open wounds or open sores.  Avoid contact with your eyes, ears, mouth, and genitals (private parts).  Wash face and genitals (private parts) with your normal soap.  Wash thoroughly, paying special attention to the area where your surgery will be performed.  Thoroughly rinse your body with warm water from the neck down.  DO NOT shower/wash with your normal soap after using and rinsing off the CHG soap.   Pat yourself dry with a CLEAN TOWEL.    Apply benzoyl peroxide.   Wear CLEAN PAJAMAS to bed the night before surgery; wear comfortable clothes the morning of surgery.  Place CLEAN SHEETS on your bed the night of your first shower and DO NOT SLEEP WITH PETS.  Day of Surgery: Shower as above Do not apply any deodorants/lotions.  Please wear clean clothes to the hospital/surgery center.   Remember to brush your teeth WITH YOUR REGULAR  TOOTHPASTE.       CHG Compatible Lotions   Aveeno Moisturizing lotion  Cetaphil Moisturizing Cream  Cetaphil Moisturizing Lotion  Clairol Herbal Essence Moisturizing Lotion, Dry Skin  Clairol Herbal Essence Moisturizing Lotion, Extra Dry Skin  Clairol Herbal Essence Moisturizing Lotion, Normal Skin  Curel Age Defying Therapeutic Moisturizing Lotion with Alpha Hydroxy  Curel Extreme Care Body Lotion  Curel Soothing Hands Moisturizing Hand Lotion  Curel Therapeutic Moisturizing Cream, Fragrance-Free  Curel Therapeutic Moisturizing Lotion, Fragrance-Free  Curel Therapeutic Moisturizing Lotion, Original Formula  Eucerin Daily Replenishing Lotion  Eucerin Dry Skin Therapy Plus Alpha Hydroxy Crme  Eucerin Dry Skin Therapy Plus Alpha Hydroxy Lotion  Eucerin Original Crme  Eucerin Original Lotion  Eucerin Plus Crme Eucerin Plus Lotion  Eucerin TriLipid Replenishing Lotion  Keri Anti-Bacterial Hand Lotion  Keri Deep Conditioning Original Lotion Dry Skin Formula Softly Scented  Keri Deep Conditioning Original Lotion, Fragrance Free Sensitive Skin Formula  Keri Lotion Fast Absorbing Fragrance Free Sensitive Skin Formula  Keri Lotion Fast Absorbing Softly Scented Dry Skin Formula  Keri Original Lotion  Keri Skin Renewal Lotion Keri Silky Smooth Lotion  Keri Silky Smooth Sensitive Skin Lotion  Nivea Body Creamy Conditioning Oil  Nivea Body Extra Enriched Lotion  Nivea Body Original Lotion  Nivea Body Sheer Moisturizing Lotion Nivea Crme  Nivea Skin Firming Lotion  NutraDerm 30 Skin Lotion  NutraDerm Skin Lotion  NutraDerm Therapeutic Skin Cream  NutraDerm Therapeutic Skin Lotion  ProShield Protective Hand Cream  Provon moisturizing lotion  Please read over the following fact sheets that you were given.

## 2024-04-20 DIAGNOSIS — I639 Cerebral infarction, unspecified: Secondary | ICD-10-CM | POA: Diagnosis not present

## 2024-04-21 DIAGNOSIS — I639 Cerebral infarction, unspecified: Secondary | ICD-10-CM | POA: Diagnosis not present

## 2024-04-22 ENCOUNTER — Other Ambulatory Visit: Payer: Self-pay

## 2024-04-22 ENCOUNTER — Encounter (HOSPITAL_COMMUNITY): Payer: Self-pay

## 2024-04-22 ENCOUNTER — Encounter (HOSPITAL_COMMUNITY)
Admission: RE | Admit: 2024-04-22 | Discharge: 2024-04-22 | Disposition: A | Discharge status: Home / Self Care | Source: Ambulatory Visit | Attending: Orthopedic Surgery | Admitting: Orthopedic Surgery

## 2024-04-22 ENCOUNTER — Ambulatory Visit

## 2024-04-22 VITALS — BP 136/100 | HR 81 | Temp 98.2°F | Resp 19 | Ht 73.0 in | Wt 243.6 lb

## 2024-04-22 DIAGNOSIS — I480 Paroxysmal atrial fibrillation: Secondary | ICD-10-CM | POA: Insufficient documentation

## 2024-04-22 DIAGNOSIS — J309 Allergic rhinitis, unspecified: Secondary | ICD-10-CM | POA: Diagnosis not present

## 2024-04-22 DIAGNOSIS — E1165 Type 2 diabetes mellitus with hyperglycemia: Secondary | ICD-10-CM | POA: Insufficient documentation

## 2024-04-22 DIAGNOSIS — X58XXXA Exposure to other specified factors, initial encounter: Secondary | ICD-10-CM | POA: Diagnosis not present

## 2024-04-22 DIAGNOSIS — M75121 Complete rotator cuff tear or rupture of right shoulder, not specified as traumatic: Secondary | ICD-10-CM | POA: Insufficient documentation

## 2024-04-22 DIAGNOSIS — Z01818 Encounter for other preprocedural examination: Secondary | ICD-10-CM

## 2024-04-22 DIAGNOSIS — I10 Essential (primary) hypertension: Secondary | ICD-10-CM | POA: Diagnosis not present

## 2024-04-22 DIAGNOSIS — Z8673 Personal history of transient ischemic attack (TIA), and cerebral infarction without residual deficits: Secondary | ICD-10-CM | POA: Diagnosis not present

## 2024-04-22 DIAGNOSIS — I639 Cerebral infarction, unspecified: Secondary | ICD-10-CM | POA: Diagnosis not present

## 2024-04-22 DIAGNOSIS — I2510 Atherosclerotic heart disease of native coronary artery without angina pectoris: Secondary | ICD-10-CM | POA: Insufficient documentation

## 2024-04-22 DIAGNOSIS — Z01812 Encounter for preprocedural laboratory examination: Secondary | ICD-10-CM | POA: Insufficient documentation

## 2024-04-22 LAB — SURGICAL PCR SCREEN
MRSA, PCR: NEGATIVE
Staphylococcus aureus: NEGATIVE

## 2024-04-22 LAB — CBC
HCT: 40.4 % (ref 39.0–52.0)
Hemoglobin: 12.2 g/dL — ABNORMAL LOW (ref 13.0–17.0)
MCH: 22.2 pg — ABNORMAL LOW (ref 26.0–34.0)
MCHC: 30.2 g/dL (ref 30.0–36.0)
MCV: 73.5 fL — ABNORMAL LOW (ref 80.0–100.0)
Platelets: 234 K/uL (ref 150–400)
RBC: 5.5 MIL/uL (ref 4.22–5.81)
RDW: 17.1 % — ABNORMAL HIGH (ref 11.5–15.5)
WBC: 5 K/uL (ref 4.0–10.5)
nRBC: 0 % (ref 0.0–0.2)

## 2024-04-22 LAB — BASIC METABOLIC PANEL WITH GFR
Anion gap: 8 (ref 5–15)
BUN: 9 mg/dL (ref 8–23)
CO2: 24 mmol/L (ref 22–32)
Calcium: 8.7 mg/dL — ABNORMAL LOW (ref 8.9–10.3)
Chloride: 106 mmol/L (ref 98–111)
Creatinine, Ser: 1.13 mg/dL (ref 0.61–1.24)
GFR, Estimated: >60 mL/min (ref >60)
Glucose, Bld: 101 mg/dL — ABNORMAL HIGH (ref 70–99)
Potassium: 3.9 mmol/L (ref 3.5–5.1)
Sodium: 138 mmol/L (ref 135–145)

## 2024-04-22 LAB — HEMOGLOBIN A1C
Hgb A1c MFr Bld: 6 % — ABNORMAL HIGH (ref 4.8–5.6)
Mean Plasma Glucose: 125.5 mg/dL

## 2024-04-22 NOTE — Progress Notes (Signed)
 Surgical Instructions     Your procedure is scheduled on April 25, 2024. Report to [facility] Main Entrance A at 9:00 A.M., then check in with the Admitting office. Any questions or running late day of surgery: call [phone]   Questions prior to your surgery date: call [phone], Monday-Friday, 8am-4pm. If you experience any cold or flu symptoms such as cough, fever, chills, shortness of breath, etc. between now and your scheduled surgery, please notify us  at the above number.            Remember:       Do not eat after midnight the night before your surgery   You may drink clear liquids until 8:00 the morning of your surgery.   Clear liquids allowed are: Water, Non-Citrus Juices (without pulp), Carbonated Beverages, Clear Tea (no milk, honey, etc.), Black Coffee Only (NO MILK, CREAM OR POWDERED CREAMER of any kind), and Gatorade.           The night before surgery:  No food after midnight. ONLY clear liquids after midnight     The day of surgery (if you have diabetes): Drink ONE (1) 12 oz G2 given to you in your pre admission testing appointment by 8:00 the morning of surgery. Drink in one sitting. Do not sip.  This drink was given to you during your hospital  pre-op  appointment visit.  Nothing else to drink after completing the  12 oz bottle of G2.          If you have questions, please contact your surgeon's office.   Take these medicines the morning of surgery with A SIP OF WATER  azelastine  (ASTELIN )  brimonidine (ALPHAGAN)  dorzolamide-timolol (COSOPT)  doxycycline  (VIBRA -TABS)  DULoxetine  (CYMBALTA )      May take these medicines IF NEEDED: acetaminophen -codeine  (TYLENOL  #3)  albuterol  (VENTOLIN  HFA) inhaler  atorvastatin  (LIPITOR)  cetirizine  (ZYRTEC )  EPINEPHrine  injection  fluticasone  (FLONASE )  fluticasone  furoate-vilanterol (BREO ELLIPTA )  ipratropium (ATROVENT ) 0.03 % nasal spray  methocarbamol  (ROBAXIN )  mometasone-formoterol (DULERA )   Olopatadine  HCl (PATADAY )    ELIQUIS  Hold 3 days prior to procedure. Last dose April 21, 2024     One week prior to surgery, STOP taking any Aspirin  (unless otherwise instructed by your surgeon) Aleve, Naproxen, Ibuprofen , Motrin , Advil , Goody's, BC's, all herbal medications, fish oil, and non-prescription vitamins.                     Do NOT Smoke (Tobacco/Vaping) for 24 hours prior to your procedure.   If you use a CPAP at night, you may bring your mask/headgear for your overnight stay.   You will be asked to remove any contacts, glasses, piercing's, hearing aid's, dentures/partials prior to surgery. Please bring cases for these items if needed.    Patients discharged the day of surgery will not be allowed to drive home, and someone needs to stay with them for 24 hours.   SURGICAL WAITING ROOM VISITATION Patients may have no more than 2 support people in the waiting area - these visitors may rotate.   Pre-op  nurse will coordinate an appropriate time for 1 ADULT support person, who may not rotate, to accompany patient in pre-op .  Children under the age of 29 must have an adult with them who is not the patient and must remain in the main waiting area with an adult.   If the patient needs to stay at the hospital during part of their recovery, the visitor guidelines for inpatient rooms apply.  Please refer to the [facility] website for the visitor guidelines for any additional information.     If you received a COVID test during your pre-op  visit  it is requested that you wear a mask when out in public, stay away from anyone that may not be feeling well and notify your surgeon if you develop symptoms. If you have been in contact with anyone that has tested positive in the last 10 days please notify you surgeon.         Pre-operative 4 CHG Bathing Instructions    You can play a key role in reducing the risk of infection after surgery. Your skin needs to be as free of germs as  possible. You can reduce the number of germs on your skin by washing with CHG (chlorhexidine  gluconate) soap before surgery. CHG is an antiseptic soap that kills germs and continues to kill germs even after washing.    DO NOT use if you have an allergy to chlorhexidine /CHG or antibacterial soaps. If your skin becomes reddened or irritated, stop using the CHG and notify one of our RNs at [phone].    Please shower with the CHG soap starting 4 days before surgery using the following schedule:       Please keep in mind the following:  DO NOT shave, including legs and underarms, starting the day of your first shower.   You may shave your face at any point before/day of surgery.  Place clean sheets on your bed the day you start using CHG soap. Use a clean washcloth (not used since being washed) for each shower. DO NOT sleep with pets once you start using the CHG.    CHG Shower Instructions:  Wash your face and private area with normal soap. If you choose to wash your hair, wash first with your normal shampoo.  After you use shampoo/soap, rinse your hair and body thoroughly to remove shampoo/soap residue.  Turn the water OFF and apply  bottle of CHG soap to a CLEAN washcloth.  Apply CHG soap ONLY FROM YOUR NECK DOWN TO YOUR TOES (washing for 3-5 minutes)  DO NOT use CHG soap on face, private areas, open wounds, or sores.  Pay special attention to the area where your surgery is being performed.  If you are having back surgery, having someone wash your back for you may be helpful. Wait 2 minutes after CHG soap is applied, then you may rinse off the CHG soap.  Pat dry with a clean towel  Put on clean clothes/pajamas   If you choose to wear lotion, please use ONLY the CHG-compatible lotions that are listed below.   Additional instructions for the day of surgery:   If you choose, you may shower the morning of surgery with an antibacterial soap.  DO NOT APPLY any lotions, deodorants, cologne,  or perfumes.   Do not bring valuables to the hospital. [facility] is not responsible for any belongings/valuables. Do not wear nail polish, gel polish, artificial nails, or any other type of covering on natural nails (fingers and toes) Do not wear jewelry or makeup Put on clean/comfortable clothes.  Please brush your teeth.  Ask your nurse before applying any prescription medications to the skin.                                      CHG Compatible Lotions    Aveeno Moisturizing  lotion  Cetaphil Moisturizing Cream  Cetaphil Moisturizing Lotion  Clairol Herbal Essence Moisturizing Lotion, Dry Skin  Clairol Herbal Essence Moisturizing Lotion, Extra Dry Skin  Clairol Herbal Essence Moisturizing Lotion, Normal Skin  Curel Age Defying Therapeutic Moisturizing Lotion with Alpha Hydroxy  Curel Extreme Care Body Lotion  Curel Soothing Hands Moisturizing Hand Lotion  Curel Therapeutic Moisturizing Cream, Fragrance-Free  Curel Therapeutic Moisturizing Lotion, Fragrance-Free  Curel Therapeutic Moisturizing Lotion, Original Formula  Eucerin Daily Replenishing Lotion  Eucerin Dry Skin Therapy Plus Alpha Hydroxy Crme  Eucerin Dry Skin Therapy Plus Alpha Hydroxy Lotion  Eucerin Original Crme  Eucerin Original Lotion  Eucerin Plus Crme Eucerin Plus Lotion  Eucerin TriLipid Replenishing Lotion  Keri Anti-Bacterial Hand Lotion  Keri Deep Conditioning Original Lotion Dry Skin Formula Softly Scented  Keri Deep Conditioning Original Lotion, Fragrance Free Sensitive Skin Formula  Keri Lotion Fast Absorbing Fragrance Free Sensitive Skin Formula  Keri Lotion Fast Absorbing Softly Scented Dry Skin Formula  Keri Original Lotion  Keri Skin Renewal Lotion Keri Silky Smooth Lotion  Keri Silky Smooth Sensitive Skin Lotion  Nivea Body Creamy Conditioning Oil  Nivea Body Extra Enriched Lotion  Nivea Body Original Lotion  Nivea Body Sheer Moisturizing Lotion Nivea Crme  Nivea Skin Firming Lotion   NutraDerm 30 Skin Lotion  NutraDerm Skin Lotion  NutraDerm Therapeutic Skin Cream  NutraDerm Therapeutic Skin Lotion  ProShield Protective Hand Cream  Provon moisturizing lotion   Please read over the following fact sheets that you were given

## 2024-04-22 NOTE — Progress Notes (Signed)
 PCP - Dr. Corrina Sabin Cardiologist - Dr. Inocencio - clearance on 02-27-24  PPM/ICD - Denies Device Orders - n/a Rep Notified - n/a  Chest x-ray -  n/a EKG - 09-22-23 Stress Test - 01-26-17 ECHO - 01-13-21 Cardiac Cath - Denies  Sleep Study - Denies CPAP - n/a  Fasting Blood Sugar - 100-110 Checks Blood Sugar twice a week.  Does not take diabetic medication.  Last dose of GLP1 agonist-  Denies GLP1 instructions: n/a  Blood Thinner Instructions: Eliquis  - per patient per cardiologist told to take last dose on Sunday, October 5th. Aspirin  Instructions: Denies  ERAS Protcol - Clears until 0800 PRE-SURGERY Ensure or G2- G2  COVID TEST- n/a   Anesthesia review: yes, HTN, CAD, Asthma, CM, A-fib  Patient denies shortness of breath, fever, cough and chest pain at PAT appointment. Patient denies any respiratory issues at this time.    All instructions explained to the patient, with a verbal understanding of the material. Patient agrees to go over the instructions while at home for a better understanding. Patient also instructed to self quarantine after being tested for COVID-19. The opportunity to ask questions was provided.

## 2024-04-23 DIAGNOSIS — Z79899 Other long term (current) drug therapy: Secondary | ICD-10-CM | POA: Diagnosis not present

## 2024-04-23 DIAGNOSIS — H30033 Focal chorioretinal inflammation, peripheral, bilateral: Secondary | ICD-10-CM | POA: Diagnosis not present

## 2024-04-23 DIAGNOSIS — I639 Cerebral infarction, unspecified: Secondary | ICD-10-CM | POA: Diagnosis not present

## 2024-04-23 DIAGNOSIS — Z961 Presence of intraocular lens: Secondary | ICD-10-CM | POA: Diagnosis not present

## 2024-04-23 DIAGNOSIS — H3581 Retinal edema: Secondary | ICD-10-CM | POA: Diagnosis not present

## 2024-04-23 DIAGNOSIS — H4043X2 Glaucoma secondary to eye inflammation, bilateral, moderate stage: Secondary | ICD-10-CM | POA: Diagnosis not present

## 2024-04-23 NOTE — Anesthesia Preprocedure Evaluation (Signed)
 Anesthesia Evaluation  Patient identified by MRN, date of birth, ID band Patient awake    Reviewed: Allergy & Precautions, NPO status , Patient's Chart, lab work & pertinent test results  Airway Mallampati: II  TM Distance: >3 FB Neck ROM: Full    Dental  (+) Teeth Intact, Dental Advisory Given   Pulmonary neg shortness of breath, asthma , neg sleep apnea, neg COPD, neg recent URI, Current Smoker and Patient abstained from smoking.   breath sounds clear to auscultation       Cardiovascular hypertension, Pt. on medications + CAD  + dysrhythmias Atrial Fibrillation  Rhythm:Regular  1. Left ventricular ejection fraction, by estimation, is 60 to 65%. The  left ventricle has normal function. The left ventricle has no regional  wall motion abnormalities. There is moderate left ventricular hypertrophy.  Left ventricular diastolic  parameters are consistent with Grade I diastolic dysfunction (impaired  relaxation). Elevated left ventricular end-diastolic pressure. The E/e' is  16.   2. Right ventricular systolic function is normal. The right ventricular  size is normal.   3. The mitral valve is grossly normal. Trivial mitral valve  regurgitation.   4. The aortic valve is tricuspid. Aortic valve regurgitation is not  visualized.   5. Aortic dilatation noted. There is borderline dilatation of the aortic  root, measuring 38 mm. There is mild dilatation of the ascending aorta,  measuring 39 mm.   6. The inferior vena cava is normal in size with greater than 50%  respiratory variability, suggesting right atrial pressure of 3 mmHg.      Neuro/Psych  Headaches PSYCHIATRIC DISORDERS Anxiety Depression Bipolar Disorder Schizophrenia   Neuromuscular disease CVA, No Residual Symptoms    GI/Hepatic Neg liver ROS,GERD  Controlled,,  Endo/Other  diabetes  Lab Results      Component                Value               Date                       NA                       138                 04/22/2024                K                        3.9                 04/22/2024                CO2                      24                  04/22/2024                GLUCOSE                  101 (H)             04/22/2024                BUN  9                   04/22/2024                CREATININE               1.13                04/22/2024                CALCIUM                   8.7 (L)             04/22/2024                GFR                      81.12               03/27/2015                EGFR                     84                  01/31/2024                GFRNONAA                 >60                 04/22/2024             Renal/GU negative Renal ROS     Musculoskeletal  (+) Arthritis ,    Abdominal   Peds  Hematology  (+) Blood dyscrasia, anemia Lab Results      Component                Value               Date                      WBC                      5.0                 04/22/2024                HGB                      12.2 (L)            04/22/2024                HCT                      40.4                04/22/2024                MCV                      73.5 (L)            04/22/2024                PLT  234                 04/22/2024              Anesthesia Other Findings   Reproductive/Obstetrics                              Anesthesia Physical Anesthesia Plan  ASA: 2  Anesthesia Plan: General and Regional   Post-op Pain Management: Regional block*   Induction: Intravenous  PONV Risk Score and Plan: 2 and Ondansetron  and Dexamethasone   Airway Management Planned: Oral ETT  Additional Equipment: None  Intra-op Plan:   Post-operative Plan: Extubation in OR  Informed Consent: I have reviewed the patients History and Physical, chart, labs and discussed the procedure including the risks, benefits and alternatives for the proposed  anesthesia with the patient or authorized representative who has indicated his/her understanding and acceptance.     Dental advisory given  Plan Discussed with: CRNA  Anesthesia Plan Comments: (PAT note written 04/23/2024 by Dalisa Forrer, PA-C.  )         Anesthesia Quick Evaluation

## 2024-04-23 NOTE — Progress Notes (Signed)
 Anesthesia Chart Review:  Case: [id number] Date/Time: 04/25/24 1050   Procedures:      ARTHROSCOPY, SHOULDER WITH DEBRIDEMENT (Right) - right shoulder arthroscopy, debridement, subacromial decompression, biceps tenodesis, subscarpular repair-open     REPAIR, SHOULDER, SUBSCAPULAR, OPEN (Right)     TENODESIS, BICEPS (Right)   Anesthesia type: General   Diagnosis: Full thickness tear of right subscapularis tendon, initial encounter [D53.188J]   Pre-op  diagnosis: right shoulder subscapularis tear, biceps tendinitis   [facility]   Surgeons: Addie Cordella Hamilton, MD       DISCUSSION: Patient is a 63 year old male scheduled for the above procedure.  Surgery was initially scheduled for 03/04/2024 but was rescheduled to 04/25/2024.  Peers it was rescheduled due to issues with his insurance company.  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  Daily tobacco and marijuana use is documented.  6 beers per week is documented for alcohol intake.   Previous anesthesia APP review by Luanne Senna, PA-C for date of service 03/04/2024:  Patient follows with Cardiology for hx of PAF on Eliquis . Has had several ablations (2017, last was in 2023). Last seen in EP clinic on 09/22/23. He has been maintaining SR. Had cardiac clearance visit on 02/27/24. Advised continue current medicines. BP controlled. He was cleared:   Preoperative clearance - According to the Revised Cardiac Risk Index (RCRI), his Perioperative Risk of Major Cardiac Event is (%): 0.9. His Functional Capacity in METs is: 4.3 according to the Duke Activity Status Index (DASI).  Per AHA/ACC guidelines, he is deemed acceptable risk for the planned procedure without additional cardiovascular testing. Will route to surgical team so they are aware.   Antiplatelet/Anticoagulant: Hold Eliquis  3 days prior to planned procedure. He is aware of this recommendation.   Seen by PCP on 01/31/24 for routine f/u. DM controlled (A1c 5.7). He reported chronic congestion 2/2 allergies   Seen by Allergist on 01/30/24 for severe asthma. He is on inhalers and allergy meds. Stable at that visit.  He reported last Eliquis  was 04/21/2024.  A1c 6.0% on 04/23/2024.  He was prescribed doxycycline  by dermatology for acne vulgaris and folliculitis  Anesthesia team to evaluate on the day of surgery.  VS: BP (!) 136/100   Pulse 81   Temp 36.8 C   Resp 19   Ht 6' 1 (1.854 m)   Wt 110.5 kg   SpO2 100%   BMI 32.14 kg/m  BP Readings from Last 3 Encounters:  04/22/24 (!) 136/100  04/10/24 (!) 149/95  02/27/24 (!) 106/99     PROVIDERS: Delbert Clam, MD his PCP Inocencio Chi, MD is EP cardiologist Maurilio Locus, MD is allergist Alm Nest, DO is dermatologist   LABS: Labs reviewed: Acceptable for surgery. (all labs ordered are listed, but only abnormal results are displayed)  Labs Reviewed  CBC - Abnormal; Notable for the following components:      Result Value   Hemoglobin 12.2 (*)    MCV 73.5 (*)    MCH 22.2 (*)    RDW 17.1 (*)    All other components within normal limits  BASIC METABOLIC PANEL WITH GFR - Abnormal; Notable for the following components:   Glucose, Bld 101 (*)    Calcium  8.7 (*)  All other components within normal limits  HEMOGLOBIN A1C - Abnormal; Notable for the following components:   Hgb A1c MFr Bld 6.0 (*)    All other components within normal limits  SURGICAL PCR SCREEN     IMAGES: MRI right shoulder 01/10/2024: IMPRESSION: 1. Large full-thickness versus complete tear of the subscapularis tendon with 15 mm of retraction and possible  thin intact inferior most fibers. 2. Severe supraspinatus tendinosis with a small shallow partial-thickness bursal surface tear along the anterior-most aspect. 3. Severe infraspinatus tendinosis with a small shallow partial-thickness bursal surface tear towards the insertion. 4. High-grade partial-thickness tear of the proximal long head of the biceps tendon.    EKG 09/22/2023 Normal sinus rhythm, rate 74 Normal ECG When compared with ECG of 23-Mar-2023 08:38, T wave inversion no longer evident in Inferior leads    CV: Cardiac CT 04/21/2022: IMPRESSION: 1. There is normal pulmonary vein drainage into the left atrium with ostial measurements above. 2. There is no thrombus in the left atrial appendage. 3. The esophagus runs in the left atrial midline and is not in proximity to any of the pulmonary vein ostia. 4. No PFO/ASD. 5. Normal coronary origin. Right dominance. 6. CAC score of 20.6 Agatston units which is 61st percentile for age-, race-, and sex-matched controls.   Echo 01/13/2021: IMPRESSIONS   1. Left ventricular ejection fraction, by estimation, is 60 to 65%. The  left ventricle has normal function. The left ventricle has no regional  wall motion abnormalities. There is moderate left ventricular hypertrophy.  Left ventricular diastolic  parameters are consistent with Grade I diastolic dysfunction (impaired  relaxation). Elevated left ventricular end-diastolic pressure. The E/e' is  16.   2. Right ventricular systolic function is normal. The right ventricular  size is normal.   3. The mitral valve is grossly normal. Trivial mitral valve  regurgitation.   4. The aortic valve is tricuspid. Aortic valve regurgitation is not  visualized.   5. Aortic dilatation noted. There is borderline dilatation of the aortic  root, measuring 38 mm. There is mild dilatation of the ascending aorta,  measuring 39 mm.   6. The inferior vena cava is normal in size with greater than 50%   respiratory variability, suggesting right atrial pressure of 3 mmHg.     Past Medical History:  Diagnosis Date   Anemia    Aneurysm of right internal iliac artery    a.) s/p embolization 08/19/2020: 29 mm RIGHT internal iliac artery aneurysm   Anxiety    Aortic atherosclerosis    Bilateral carpal tunnel syndrome 01/10/2018   Bipolar disorder (HCC)    Chorioretinal inflammation of both eyes    a.) on azothioprine   Chronic lower back pain    Coronary artery calcification seen on CT scan    a.) cCTA 04/21/2022: Ca score 20.6 (61st percentile for age/sex match control)   DDD (degenerative disc disease), cervical    Depression    Diastolic dysfunction    a.) TTE 01/13/2021: EF 60-65%, mod LVH, triv MR, G1DD   GERD (gastroesophageal reflux disease)    Hepatic steatosis    History of alcohol abuse    History of nuclear stress test    Myoview 10/16: EF 50%, diaphragmatic attenuation, no ischemia, low risk   Hypertension    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   a.) CT head 12/25/2012 --> RIGHT basal ganglia hypoattenuation related to remote lacunar infarct   Lipoma    Long term (current) use of anticoagulants  a.) apixaban    Long-term current use of immunomodulator    a.) on azothioprine for peripheral focal chorioretinal inflammation (both eyes)   Marijuana use    Migraine    Moderate persistent asthma with acute exacerbation 05/02/2018   PAF (paroxysmal atrial fibrillation) (HCC) 03/27/2015   a.) CHA2DS2VASc = 5 (HTN, CVA x2, vascular disease history, T2DM);  b.) s/p ablation 10/02/2015; c.) s/p ablation 12/10/2015; d.) s/p DCCV (200 J x 1) 12/11/2015; e.) s/p ablation 04/28/2022; f.) rate/rhythm maintained on oral diltiazem  + carvedilol ; chronically anticoagulated with apixaban    Pilonidal cyst    Schizophrenia (HCC)    Sciatica neuralgia    Stroke (HCC)    T2DM (type 2 diabetes mellitus) (HCC)    Thoracic aortic ectasia 01/13/2021   a.) TTE 01/13/2021: Ao root 38 mm, asc  Ao 39 mm    Past Surgical History:  Procedure Laterality Date   ATRIAL FIBRILLATION ABLATION  09/22/2015   ATRIAL FIBRILLATION ABLATION N/A 04/28/2022   Procedure: ATRIAL FIBRILLATION ABLATION;  Surgeon: Inocencio Soyla Lunger, MD;  [facility];  Service: Cardiovascular;  Laterality: N/A;   CATARACT EXTRACTION Bilateral    CYST EXCISION  1996-97   surgery back of head    ELECTROPHYSIOLOGIC STUDY N/A 09/22/2015   Procedure: Atrial Fibrillation Ablation;  Surgeon: Will Lunger Inocencio, MD;  [facility];  Service: Cardiovascular;  Laterality: N/A;   ELECTROPHYSIOLOGIC STUDY N/A 12/10/2015   Procedure: Atrial Fibrillation Ablation;  Surgeon: Will Lunger Inocencio, MD;  [facility];  Service: Cardiovascular;  Laterality: N/A;   ELECTROPHYSIOLOGIC STUDY N/A 12/11/2015   Procedure: Cardioversion;  Surgeon: Will Lunger Inocencio, MD;  [facility];  Service: Cardiovascular;  Laterality: N/A;   EMBOLIZATION (CATH LAB) Right 08/19/2020   Procedure: EMBOLIZATION;  Surgeon: Magda Debby SAILOR, MD;  [facility];  Service: Cardiovascular;  Laterality: Right;  hypogastric   EXCISION MASS HEAD N/A 01/06/2017   Procedure: EXCISION MASS FOREHEAD;  Surgeon: Arelia Filippo, MD;  Location: [facility];  Service: Plastics;  Laterality: N/A;   EXCISION MASS UPPER EXTREMETIES Right 08/08/2022   Procedure: EXCISION MASS RIGHT FOREARM;  Surgeon: Murrell Drivers, MD;  Location: [facility];  Service: Orthopedics;  Laterality: Right;  45 MIN   GANGLION CYST EXCISION Left    INTERCOSTAL NERVE BLOCK  07/19/2003   KNEE ARTHROSCOPY Right 07/18/2014   MASS EXCISION N/A 01/25/2021   Procedure: EXCISION SUBCUTANEOUS VS SUBFASCIAL MASS TORSO 3CM;  Surgeon: Arelia Filippo, MD;  Location: [facility];  Service: Plastics;  Laterality: N/A;   PILONIDAL CYST EXCISION N/A 09/13/2022   Procedure: CYST EXCISION  PILONIDAL EXTENSIVE;  Surgeon: Desiderio Schanz, MD;  Location: [facility];  Service: General;  Laterality: N/A;   SHOULDER ARTHROSCOPY WITH ROTATOR CUFF REPAIR AND OPEN BICEPS TENODESIS Left 07/26/2023   Procedure: LEFT SHOULDER ARTHROSCOPY WITH OPEN ROTATOR CUFF REPAIR AND OPEN BICEPS TENODESIS;  Surgeon: Barbarann Oneil BROCKS, MD;  Location: [facility];  Service: Orthopedics;  Laterality: Left;    MEDICATIONS:  ELIQUIS  5 MG TABS tablet   ACCU-CHEK GUIDE TEST test strip   Accu-Chek Softclix Lancets lancets   acetaminophen -codeine  (TYLENOL  #3) 300-30 MG tablet   albuterol  (VENTOLIN  HFA) 108 (90 Base) MCG/ACT inhaler   atorvastatin  (LIPITOR) 40 MG tablet   azelastine  (ASTELIN ) 0.1 % nasal spray   brimonidine (ALPHAGAN) 0.2 % ophthalmic solution   cetirizine  (ZYRTEC ) 10 MG tablet   clindamycin  (CLEOCIN  T)  1 % SWAB   cromolyn  (OPTICROM ) 4 % ophthalmic solution   dorzolamide-timolol (COSOPT) 22.3-6.8 MG/ML ophthalmic solution   doxycycline  (VIBRA -TABS) 100 MG tablet   DULoxetine  (CYMBALTA ) 60 MG capsule   EPINEPHrine  0.3 mg/0.3 mL IJ SOAJ injection   fluticasone  (FLONASE ) 50 MCG/ACT nasal spray   fluticasone  furoate-vilanterol (BREO ELLIPTA ) 200-25 MCG/ACT AEPB   folic acid (FOLVITE) 1 MG tablet   furosemide  (LASIX ) 20 MG tablet   GARLIC PO   ipratropium (ATROVENT ) 0.03 % nasal spray   latanoprost (XALATAN) 0.005 % ophthalmic solution   methocarbamol  (ROBAXIN ) 500 MG tablet   Misc. Devices MISC   Misc. Devices MISC   mometasone-formoterol (DULERA ) 200-5 MCG/ACT AERO   montelukast  (SINGULAIR ) 10 MG tablet   Olopatadine  HCl (PATADAY ) 0.2 % SOLN   tretinoin  (RETIN-A ) 0.05 % cream   valsartan  (DIOVAN ) 80 MG tablet   Vitamin D3 (VITAMIN D) 25 MCG tablet   No current facility-administered medications for this encounter.    Isaiah Ruder, PA-C Surgical Short Stay/Anesthesiology [facility] Phone [phone] [facility] Phone [phone] 04/23/2024 5:06 PM

## 2024-04-24 DIAGNOSIS — I639 Cerebral infarction, unspecified: Secondary | ICD-10-CM | POA: Diagnosis not present

## 2024-04-25 ENCOUNTER — Ambulatory Visit (HOSPITAL_COMMUNITY)

## 2024-04-25 ENCOUNTER — Ambulatory Visit (HOSPITAL_COMMUNITY): Payer: Self-pay | Admitting: Vascular Surgery

## 2024-04-25 ENCOUNTER — Encounter (HOSPITAL_COMMUNITY): Payer: Self-pay | Admitting: Orthopedic Surgery

## 2024-04-25 ENCOUNTER — Other Ambulatory Visit: Payer: Self-pay

## 2024-04-25 ENCOUNTER — Ambulatory Visit (HOSPITAL_COMMUNITY)
Admission: RE | Admit: 2024-04-25 | Discharge: 2024-04-25 | Disposition: A | Discharge status: Home / Self Care | Attending: Orthopedic Surgery | Admitting: Orthopedic Surgery

## 2024-04-25 ENCOUNTER — Encounter (HOSPITAL_COMMUNITY)
Admission: RE | Disposition: A | Discharge status: Home / Self Care | Payer: Self-pay | Source: Home / Self Care | Attending: Orthopedic Surgery

## 2024-04-25 DIAGNOSIS — I10 Essential (primary) hypertension: Secondary | ICD-10-CM | POA: Diagnosis not present

## 2024-04-25 DIAGNOSIS — S43431A Superior glenoid labrum lesion of right shoulder, initial encounter: Secondary | ICD-10-CM

## 2024-04-25 DIAGNOSIS — S46219A Strain of muscle, fascia and tendon of other parts of biceps, unspecified arm, initial encounter: Secondary | ICD-10-CM | POA: Insufficient documentation

## 2024-04-25 DIAGNOSIS — Z7901 Long term (current) use of anticoagulants: Secondary | ICD-10-CM | POA: Diagnosis not present

## 2024-04-25 DIAGNOSIS — M75111 Incomplete rotator cuff tear or rupture of right shoulder, not specified as traumatic: Secondary | ICD-10-CM | POA: Diagnosis not present

## 2024-04-25 DIAGNOSIS — G8918 Other acute postprocedural pain: Secondary | ICD-10-CM | POA: Diagnosis not present

## 2024-04-25 DIAGNOSIS — E119 Type 2 diabetes mellitus without complications: Secondary | ICD-10-CM | POA: Insufficient documentation

## 2024-04-25 DIAGNOSIS — M7521 Bicipital tendinitis, right shoulder: Secondary | ICD-10-CM | POA: Diagnosis not present

## 2024-04-25 DIAGNOSIS — S46911A Strain of unspecified muscle, fascia and tendon at shoulder and upper arm level, right arm, initial encounter: Secondary | ICD-10-CM | POA: Diagnosis not present

## 2024-04-25 DIAGNOSIS — S46811A Strain of other muscles, fascia and tendons at shoulder and upper arm level, right arm, initial encounter: Secondary | ICD-10-CM | POA: Diagnosis present

## 2024-04-25 DIAGNOSIS — Z7951 Long term (current) use of inhaled steroids: Secondary | ICD-10-CM | POA: Diagnosis not present

## 2024-04-25 DIAGNOSIS — I2510 Atherosclerotic heart disease of native coronary artery without angina pectoris: Secondary | ICD-10-CM | POA: Insufficient documentation

## 2024-04-25 DIAGNOSIS — M65911 Unspecified synovitis and tenosynovitis, right shoulder: Secondary | ICD-10-CM | POA: Diagnosis not present

## 2024-04-25 DIAGNOSIS — F17210 Nicotine dependence, cigarettes, uncomplicated: Secondary | ICD-10-CM | POA: Diagnosis not present

## 2024-04-25 DIAGNOSIS — I4891 Unspecified atrial fibrillation: Secondary | ICD-10-CM | POA: Insufficient documentation

## 2024-04-25 DIAGNOSIS — M75121 Complete rotator cuff tear or rupture of right shoulder, not specified as traumatic: Secondary | ICD-10-CM | POA: Diagnosis not present

## 2024-04-25 DIAGNOSIS — X58XXXA Exposure to other specified factors, initial encounter: Secondary | ICD-10-CM | POA: Insufficient documentation

## 2024-04-25 DIAGNOSIS — I639 Cerebral infarction, unspecified: Secondary | ICD-10-CM | POA: Diagnosis not present

## 2024-04-25 DIAGNOSIS — Z01818 Encounter for other preprocedural examination: Secondary | ICD-10-CM

## 2024-04-25 DIAGNOSIS — K219 Gastro-esophageal reflux disease without esophagitis: Secondary | ICD-10-CM | POA: Insufficient documentation

## 2024-04-25 HISTORY — PX: OPEN SUBSCAPULARIS REPAIR: SHX6233

## 2024-04-25 HISTORY — PX: POSTERIOR LUMBAR FUSION 2 WITH HARDWARE REMOVAL: SHX7297

## 2024-04-25 LAB — GLUCOSE, CAPILLARY
Glucose-Capillary: 107 mg/dL — ABNORMAL HIGH (ref 70–99)
Glucose-Capillary: 111 mg/dL — ABNORMAL HIGH (ref 70–99)

## 2024-04-25 MED ORDER — MIDAZOLAM HCL 2 MG/2ML IJ SOLN
INTRAMUSCULAR | Status: AC
  Administered 2024-04-25: 2 mg via INTRAVENOUS
  Filled 2024-04-25: qty 2

## 2024-04-25 MED ORDER — SODIUM CHLORIDE 0.9 % IR SOLN
Status: DC | PRN
  Administered 2024-04-25 (×2): 3000 mL

## 2024-04-25 MED ORDER — INSULIN ASPART 100 UNIT/ML IJ SOLN: 0.0000 [IU] | INTRAMUSCULAR | Status: DC | PRN

## 2024-04-25 MED ORDER — DEXAMETHASONE SODIUM PHOSPHATE 10 MG/ML IJ SOLN
INTRAMUSCULAR | Status: DC | PRN
  Administered 2024-04-25: 8 mg via INTRAVENOUS

## 2024-04-25 MED ORDER — ONDANSETRON HCL 4 MG/2ML IJ SOLN
INTRAMUSCULAR | Status: AC
  Filled 2024-04-25: qty 2

## 2024-04-25 MED ORDER — ORAL CARE MOUTH RINSE: 15.0000 mL | Freq: Once | OROMUCOSAL | Status: AC

## 2024-04-25 MED ORDER — GLYCOPYRROLATE PF 0.2 MG/ML IJ SOSY
PREFILLED_SYRINGE | INTRAMUSCULAR | Status: DC | PRN
  Administered 2024-04-25: .1 mg via INTRAVENOUS

## 2024-04-25 MED ORDER — OXYCODONE HCL 5 MG PO TABS: 5.0000 mg | ORAL_TABLET | ORAL | 0 refills | Status: DC | PRN

## 2024-04-25 MED ORDER — FENTANYL CITRATE (PF) 100 MCG/2ML IJ SOLN
50.0000 ug | Freq: Once | INTRAMUSCULAR | Status: AC
Start: 2024-04-25 — End: 2024-04-25

## 2024-04-25 MED ORDER — CHLORHEXIDINE GLUCONATE 0.12 % MT SOLN
15.0000 mL | Freq: Once | OROMUCOSAL | Status: AC
  Administered 2024-04-25: 15 mL via OROMUCOSAL
  Filled 2024-04-25: qty 15

## 2024-04-25 MED ORDER — PROPOFOL 10 MG/ML IV BOLUS
INTRAVENOUS | Status: DC | PRN
  Administered 2024-04-25: 60 mg via INTRAVENOUS
  Administered 2024-04-25: 140 mg via INTRAVENOUS

## 2024-04-25 MED ORDER — ROCURONIUM BROMIDE 10 MG/ML (PF) SYRINGE
PREFILLED_SYRINGE | INTRAVENOUS | Status: AC
  Filled 2024-04-25: qty 10

## 2024-04-25 MED ORDER — LACTATED RINGERS IV SOLN
INTRAVENOUS | Status: DC
Start: 2024-04-25 — End: 2024-04-25

## 2024-04-25 MED ORDER — FENTANYL CITRATE (PF) 250 MCG/5ML IJ SOLN
INTRAMUSCULAR | Status: AC
  Filled 2024-04-25: qty 5

## 2024-04-25 MED ORDER — ROCURONIUM BROMIDE 10 MG/ML (PF) SYRINGE
PREFILLED_SYRINGE | INTRAVENOUS | Status: DC | PRN
  Administered 2024-04-25: 10 mg via INTRAVENOUS
  Administered 2024-04-25: 20 mg via INTRAVENOUS
  Administered 2024-04-25: 50 mg via INTRAVENOUS

## 2024-04-25 MED ORDER — FENTANYL CITRATE (PF) 100 MCG/2ML IJ SOLN
INTRAMUSCULAR | Status: AC
  Administered 2024-04-25: 50 ug via INTRAVENOUS
  Filled 2024-04-25: qty 2

## 2024-04-25 MED ORDER — ONDANSETRON HCL 4 MG/2ML IJ SOLN
INTRAMUSCULAR | Status: DC | PRN
  Administered 2024-04-25: 4 mg via INTRAVENOUS

## 2024-04-25 MED ORDER — LIDOCAINE 2% (20 MG/ML) 5 ML SYRINGE
INTRAMUSCULAR | Status: AC
  Filled 2024-04-25: qty 5

## 2024-04-25 MED ORDER — MIDAZOLAM HCL 2 MG/2ML IJ SOLN: 2.0000 mg | Freq: Once | INTRAMUSCULAR | Status: AC

## 2024-04-25 MED ORDER — PROPOFOL 10 MG/ML IV BOLUS
INTRAVENOUS | Status: AC
Start: 2024-04-25 — End: 2024-04-25
  Filled 2024-04-25: qty 20

## 2024-04-25 MED ORDER — BUPIVACAINE LIPOSOME 1.3 % IJ SUSP
INTRAMUSCULAR | Status: DC | PRN
  Administered 2024-04-25: 10 mL via PERINEURAL

## 2024-04-25 MED ORDER — BUPIVACAINE-EPINEPHRINE (PF) 0.5% -1:200000 IJ SOLN
INTRAMUSCULAR | Status: DC | PRN
  Administered 2024-04-25: 15 mL

## 2024-04-25 MED ORDER — EPINEPHRINE PF 1 MG/ML IJ SOLN
INTRAMUSCULAR | Status: AC
  Filled 2024-04-25: qty 1

## 2024-04-25 MED ORDER — EPHEDRINE SULFATE-NACL 50-0.9 MG/10ML-% IV SOSY
PREFILLED_SYRINGE | INTRAVENOUS | Status: DC | PRN
  Administered 2024-04-25 (×2): 5 mg via INTRAVENOUS

## 2024-04-25 MED ORDER — ACETAMINOPHEN 10 MG/ML IV SOLN
INTRAVENOUS | Status: AC
  Filled 2024-04-25: qty 100

## 2024-04-25 MED ORDER — SUGAMMADEX SODIUM 200 MG/2ML IV SOLN
INTRAVENOUS | Status: DC | PRN
  Administered 2024-04-25: 200 mg via INTRAVENOUS

## 2024-04-25 MED ORDER — 0.9 % SODIUM CHLORIDE (POUR BTL) OPTIME
TOPICAL | Status: DC | PRN
  Administered 2024-04-25: 1000 mL

## 2024-04-25 MED ORDER — FENTANYL CITRATE (PF) 250 MCG/5ML IJ SOLN
INTRAMUSCULAR | Status: DC | PRN
  Administered 2024-04-25: 50 ug via INTRAVENOUS
  Administered 2024-04-25: 100 ug via INTRAVENOUS

## 2024-04-25 MED ORDER — CEFAZOLIN SODIUM-DEXTROSE 2-4 GM/100ML-% IV SOLN
2.0000 g | INTRAVENOUS | Status: AC
  Administered 2024-04-25: 2 g via INTRAVENOUS
  Filled 2024-04-25: qty 100

## 2024-04-25 MED ORDER — PHENYLEPHRINE HCL-NACL 20-0.9 MG/250ML-% IV SOLN
INTRAVENOUS | Status: DC | PRN
  Administered 2024-04-25: 40 ug/min via INTRAVENOUS

## 2024-04-25 NOTE — Anesthesia Procedure Notes (Signed)
 Anesthesia Regional Block: Interscalene brachial plexus block   Pre-Anesthetic Checklist: , timeout performed,  Correct Patient, Correct Site, Correct Laterality,  Correct Procedure, Correct Position, site marked,  Risks and benefits discussed,  Surgical consent,  Pre-op  evaluation,  At surgeon's request and post-op pain management  Laterality: Right and Upper  Prep: chloraprep       Needles:  Injection technique: Single-shot      Needle Length: 5cm  Needle Gauge: 22     Additional Needles: Arrow StimuQuik ECHO Echogenic Stimulating PNB Needle  Procedures:,,,, ultrasound used (permanent image in chart),,    Narrative:  Start time: 04/25/2024 11:04 AM End time: 04/25/2024 11:10 AM Injection made incrementally with aspirations every 5 mL.  Performed by: Personally  Anesthesiologist: Leopoldo Bruckner, MD

## 2024-04-25 NOTE — Op Note (Signed)
 NAME: Jason Stokes, GELLNER MEDICAL RECORD NO: [id number] ACCOUNT NO: [id number] DATE OF BIRTH: 12/14/1960 FACILITY: [facility] LOCATION: [facility] PHYSICIAN: Cordella RAMAN. Addie, MD  Operative Report   DATE OF PROCEDURE: 04/25/2024  PREOPERATIVE DIAGNOSES:  Right shoulder subscapularis rupture and biceps tendon tear.  POSTOPERATIVE DIAGNOSES:  Right shoulder subscapularis rupture and biceps tendon tear.  PROCEDURE:  Right shoulder arthroscopy with debridement of residual biceps tendon stump and superior labrum as well as partial-thickness rotator cuff tear with subsequent open subscapularis tendon repair and subacromial decompression with patch  augmentation from Arthrex.  SURGEON:  Cordella RAMAN. Addie, MD  ASSISTANT:  Herlene Calix, PA.  INDICATIONS:  This is a 63 year old patient with right arm pain and weakness who presents for operative management after explanation of risks and benefits.  DESCRIPTION OF PROCEDURE:  The patient was brought to the operating room where general anesthetic was induced.  Preoperative antibiotics were administered.  A timeout was called.  The patient was placed in the beach chair position with the head in the  neutral position.  Right arm, shoulder, and hand were prescrubbed with alcohol and Betadine allowed to air dry.  Prepped with DuraPrep solution and draped in a sterile manner.  An Ioban was used to seal the operative field and cover the axilla.  After calling time-out, the posterior portal was created 2 cm medial and inferior to the posterolateral margin of the acromion.  Diagnostic arthroscopy was performed.  Anterior portal was created under direct visualization.  The patient had mild grade I  chondromalacia changes over 50% of the humeral head.  No significant glenoid articular surface degenerative changes.  Biceps tendon had already ruptured.  Biceps tendon stump was in the joint.  This was debrided using an ArthroCare wand and the superior  labrum was also debrided.   Partial-thickness rotator cuff tearing of the supraspinatus was present, which was debrided with the shaver.  At this time, the intraarticular subscapularis was examined and found to have a tear with retraction, but there was  some mobility to the tear enough that repair was deemed possible.  At this time, instruments were removed and the portals were closed using 3-0 nylon.  An Ioban was then used to cover the entire operative field.  Deltopectoral approach was then made.  Skin and subcutaneous tissues were sharply divided.  Cephalic vein mobilized laterally.  At this time, subdeltoid and subacromial adhesions were released manually.  The Kolbel retractor was placed.  Axillary nerve  palpated and protected at all times during the case.  Next, the rotator interval was opened.  Bursal and scar tissue was present, which was detached and that was used to mobilize the subscapularis.  The rotator interval was opened up to the base of the  coracoid.  Manual mobilization of the subscapularis tendon demonstrated good mobility.  At this time, bursal tissue was trimmed away from the lateral edge of the subscapularis tendon.  Next, the bony landing zone of the lesser tuberosity was prepared  with a scalpel.  We placed 2 Arthrex suture anchors at the junction of the medial aspect of the lesser tuberosity and the articular surface.  The subscapularis tendon was then mobilized over and the 8 suture tapes were passed through the subscapularis  tendon using a free needle.  We then reattached the subscapularis using 0 Vicryl suture to residual proximal and lateral subscapularis tendon, which was still attached.  At this time, the SutureTapes were tied and then passed through a Arthrex CuffMend  patch.  Next, after tying down the suture tapes, the leading edge of the tendon was sutured using 0 Vicryl suture to soft tissue lateral to the tuberosity.  This allowed the rotator cuff tendon to lie flat at its attachment site.  The  patch was then  sutured with tension over the subscapularis and attached to the soft tissue transverse humeral ligament around the bicipital groove.  This gave a very flat reinforcement to the rotator cuff tendon repair.  The superior, inferior, and lateral edge were  then sutured using 0 Vicryl suture to surrounding soft tissue to make sure that the tendon laid flat.  The rotator interval was then closed using #1 Vicryl suture to reinforce the subscapularis repair superiorly.  Next, 2 SwiveLocks were utilized to  place in the 4 SutureTapes each into the greater tuberosity.  The accessory sutures were then used to anchor the lateral aspect of the patch to the construct.  All in all, a very solid flat construct, which reinforced the subscapularis repair was  achieved.  The patient had about 55 degrees of external rotation with the repair compared to 85 without the repair at the beginning of the case.  Acromioplasty was performed with a rasp.  CA ligament was partially released.  Thorough irrigation was performed.  The deltopectoral interval was then closed using #1 Vicryl suture followed by interrupted inverted #0 Vicryl suture, 2-0 Vicryl suture with  3-0 Monocryl with Steri-Strips and Aquacel dressing applied.  Impervious dressings also applied to the portals.  Shoulder immobilizer placed.  The patient tolerated the procedure well without immediate complications.  Luke's assistance was required at  all times for retraction, opening and closing mobilization of tissue.  His assistance was of medical necessity.   PUS D: 04/25/2024 3:04:17 pm T: 04/25/2024 4:51:00 pm  JOB: [id number]/ [id number]

## 2024-04-25 NOTE — Anesthesia Procedure Notes (Signed)
 Procedure Name: Intubation Date/Time: 04/25/2024 12:01 PM  Performed by: Ryen Rhames C, CRNAPre-anesthesia Checklist: Patient identified, Emergency Drugs available, Suction available and Patient being monitored Patient Re-evaluated:Patient Re-evaluated prior to induction Oxygen Delivery Method: Circle system utilized Preoxygenation: Pre-oxygenation with 100% oxygen Induction Type: IV induction Ventilation: Mask ventilation without difficulty Laryngoscope Size: Mac and 4 Grade View: Grade I Tube type: Oral Number of attempts: 1 Airway Equipment and Method: Stylet and Oral airway Placement Confirmation: ETT inserted through vocal cords under direct vision, positive ETCO2 and breath sounds checked- equal and bilateral Secured at: 23 cm Tube secured with: Tape Dental Injury: Teeth and Oropharynx as per pre-operative assessment

## 2024-04-25 NOTE — H&P (Signed)
 Jason Stokes is an 63 y.o. male.   Chief Complaint: Right shoulder pain HPI: Jason Stokes is a 63 y.o. male who presents  reporting several month history of right shoulder pain.  .  Cannot really lay on the right-hand side.  Has a history of left shoulder rotator cuff tear repair of the supraspinatus 1/25. .  Currently not working.  MRI scan performed on Jason Stokes is reviewed today.  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  Also has biceps tendinopathy and partial tearing along with a full-thickness retracted tear of the subscap.  The retraction amount is about 1 to 1-1/2 cm.  Patient is a smoker.  He did try physical therapy but could not do it on the shoulder because of pain..    Past Medical History:  Diagnosis Date   Anemia    Aneurysm of right internal iliac artery    a.) s/p embolization 08/19/2020: 29 mm RIGHT internal iliac artery aneurysm   Anxiety    Aortic atherosclerosis    Bilateral carpal tunnel syndrome 01/10/2018   Bipolar disorder (HCC)    Chorioretinal inflammation of both eyes    a.) on azothioprine   Chronic lower back pain    Coronary artery calcification seen on CT scan    a.) cCTA 04/21/2022: Ca score 20.6 (61st percentile for age/sex match control)   DDD (degenerative disc disease), cervical    Depression    Diastolic dysfunction    a.) TTE 01/13/2021: EF 60-65%, mod LVH, triv MR, G1DD   GERD (gastroesophageal reflux disease)    Hepatic steatosis    History of alcohol abuse    History of nuclear stress test    Myoview 10/16: EF 50%, diaphragmatic attenuation, no ischemia, low risk   Hypertension    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   a.) CT head 12/25/2012 --> RIGHT basal ganglia hypoattenuation related to remote lacunar infarct   Lipoma    Long term (current) use of anticoagulants    a.) apixaban    Long-term current use of immunomodulator    a.) on azothioprine for peripheral focal chorioretinal inflammation (both eyes)    Marijuana use    Migraine    Moderate persistent asthma with acute exacerbation 05/02/2018   PAF (paroxysmal atrial fibrillation) (HCC) 03/27/2015   a.) CHA2DS2VASc = 5 (HTN, CVA x2, vascular disease history, T2DM);  b.) s/p ablation 10/02/2015; c.) s/p ablation 12/10/2015; d.) s/p DCCV (200 J x 1) 12/11/2015; e.) s/p ablation 04/28/2022; f.) rate/rhythm maintained on oral diltiazem  + carvedilol ; chronically anticoagulated with apixaban    Pilonidal cyst    Schizophrenia (HCC)    Sciatica neuralgia    Stroke (HCC)    T2DM (type 2 diabetes mellitus) (HCC)    Thoracic aortic ectasia 01/13/2021   a.) TTE 01/13/2021: Ao root 38 mm, asc Ao 39 mm    Past Surgical History:  Procedure Laterality Date   ATRIAL FIBRILLATION ABLATION  09/22/2015   ATRIAL FIBRILLATION ABLATION N/A 04/28/2022   Procedure: ATRIAL FIBRILLATION ABLATION;  Surgeon: Jason Soyla Lunger, MD;  Location: [facility];  Service: Cardiovascular;  Laterality: N/A;   CATARACT EXTRACTION Bilateral    CYST EXCISION  1996-97   surgery back of head    ELECTROPHYSIOLOGIC STUDY N/A 09/22/2015   Procedure: Atrial Fibrillation Ablation;  Surgeon: Jason Stokes Inocencio, MD;  Location: [facility];  Service: Cardiovascular;  Laterality: N/A;   ELECTROPHYSIOLOGIC STUDY N/A 12/10/2015   Procedure: Atrial Fibrillation Ablation;  Surgeon:  Jason Gladis Norton, MD;  Location: [facility];  Service: Cardiovascular;  Laterality: N/A;   ELECTROPHYSIOLOGIC STUDY N/A 12/11/2015   Procedure: Cardioversion;  Surgeon: Jason Gladis Norton, MD;  Location: [facility];  Service: Cardiovascular;  Laterality: N/A;   EMBOLIZATION (CATH LAB) Right 08/19/2020   Procedure: EMBOLIZATION;  Surgeon: Jason Debby SAILOR, MD;  Location: [facility];  Service: Cardiovascular;  Laterality: Right;  hypogastric   EXCISION MASS HEAD N/A 01/06/2017   Procedure: EXCISION MASS FOREHEAD;  Surgeon: Jason Filippo, MD;  Location: [facility];  Service: Plastics;  Laterality: N/A;   EXCISION MASS UPPER EXTREMETIES Right 08/08/2022   Procedure: EXCISION MASS RIGHT FOREARM;  Surgeon: Jason Drivers, MD;  Location: [facility];  Service: Orthopedics;  Laterality: Right;  45 MIN   GANGLION CYST EXCISION Left    INTERCOSTAL NERVE BLOCK  07/19/2003   KNEE ARTHROSCOPY Right 07/18/2014   MASS EXCISION N/A 01/25/2021   Procedure: EXCISION SUBCUTANEOUS VS SUBFASCIAL MASS TORSO 3CM;  Surgeon: Jason Filippo, MD;  Location: [facility];  Service: Plastics;  Laterality: N/A;   PILONIDAL CYST EXCISION N/A 09/13/2022   Procedure: CYST EXCISION PILONIDAL EXTENSIVE;  Surgeon: Jason Schanz, MD;  Location: [facility];  Service: General;  Laterality: N/A;   SHOULDER ARTHROSCOPY WITH ROTATOR CUFF REPAIR AND OPEN BICEPS TENODESIS Left 07/26/2023   Procedure: LEFT SHOULDER ARTHROSCOPY WITH OPEN ROTATOR CUFF REPAIR AND OPEN BICEPS TENODESIS;  Surgeon: Jason Oneil BROCKS, MD;  Location: [facility];  Service: Orthopedics;  Laterality: Left;    Family History  Problem Relation Age of Onset   Heart disease Father    Schizophrenia Sister    Social History:  reports that he has been smoking cigarettes. He started smoking about 30 years ago. He has a 0.1 pack-year smoking history. He has never been exposed to tobacco smoke. He has never used smokeless tobacco. He reports current alcohol use of about 6.0 standard drinks of alcohol per week. He reports that he does not currently use drugs after having used the following drugs: Marijuana.  Allergies:  Allergies  Allergen Reactions   Lisinopril Anaphylaxis    Swelling of lips and tongue.   Tylenol  [Acetaminophen ] Other (See Comments)    Acid reflux    Tramadol  Other (See Comments)    Nighmares   Lyrica  [Pregabalin ] Palpitations    Medications Prior to Admission  Medication Sig Dispense Refill   Accu-Chek Softclix Lancets lancets Use as instructed 100 each  12   acetaminophen -codeine  (TYLENOL  #3) 300-30 MG tablet Take 1 tablet by mouth at bedtime as needed for moderate pain (pain score 4-6). 20 tablet 0   albuterol  (VENTOLIN  HFA) 108 (90 Base) MCG/ACT inhaler INHALE 2 PUFFS INTO THE LUNGS UP TO EVERY 4 HOURS AS NEEDED FOR WHEEZING OR SHORTNESS OF BREATH 18 each 1   atorvastatin  (LIPITOR) 40 MG tablet Take 1 tablet (40 mg total) by mouth daily. 90 tablet 1   azelastine  (ASTELIN ) 0.1 % nasal spray Place 1 spray into both nostrils 2 (two) times daily. 1 spray each nostril twice a day (Patient taking differently: Place 1 spray into both nostrils 2 (two) times daily as needed for rhinitis or allergies.) 90 mL 1   brimonidine (ALPHAGAN) 0.2 % ophthalmic solution Place 1 drop into both eyes every 8 (eight) hours.     cetirizine  (ZYRTEC ) 10 MG tablet Take 1 tablet (10 mg total) by mouth daily as needed for allergies (Can take  an extra dose during flare ups.). 180 tablet 1   clindamycin  (CLEOCIN  T) 1 % SWAB Apply 1 application  topically daily. Apply to face, back and buttocks daily 60 each 4   cromolyn  (OPTICROM ) 4 % ophthalmic solution PLACE 1 DROP INTO BOTH EYES 4 (FOUR) TIMES DAILY AS NEEDED. 30 mL 1   dorzolamide-timolol (COSOPT) 22.3-6.8 MG/ML ophthalmic solution Place 1 drop into both eyes 2 (two) times daily.     doxycycline  (VIBRA -TABS) 100 MG tablet Take 1 tablet (100 mg total) by mouth daily. Take 1 tablet daily with food and plenty of fluid. 30 tablet 0   DULoxetine  (CYMBALTA ) 60 MG capsule Take 1 capsule (60 mg total) by mouth daily. For chronic pain and neuropathy (Patient taking differently: Take 60 mg by mouth daily as needed (when having nerve pain). For chronic pain and neuropathy) 90 capsule 1   ELIQUIS  5 MG TABS tablet TAKE 1 TABLET BY MOUTH TWICE A DAY (Patient taking differently: Take 5 mg by mouth daily.) 60 tablet 5   EPINEPHrine  0.3 mg/0.3 mL IJ SOAJ injection Inject 0.3 mg into the muscle as needed for anaphylaxis. 0.3 mL 1   fluticasone   (FLONASE ) 50 MCG/ACT nasal spray Place 1 spray into both nostrils 2 (two) times daily. 1 spray each nostril twice a day (Patient taking differently: Place 1 spray into both nostrils 2 (two) times daily as needed for allergies or rhinitis.) 48 g 1   fluticasone  furoate-vilanterol (BREO ELLIPTA ) 200-25 MCG/ACT AEPB Inhale 1 puff into the lungs in the morning. (Patient taking differently: Inhale 1 puff into the lungs daily as needed (Asthma).) 84 each 1   folic acid (FOLVITE) 1 MG tablet Take 1 mg by mouth daily.     GARLIC PO Take 1 tablet by mouth daily.     ipratropium (ATROVENT ) 0.03 % nasal spray Place 2 sprays into both nostrils 4 (four) times daily as needed for rhinitis. 30 mL 5   latanoprost (XALATAN) 0.005 % ophthalmic solution Place 1 drop into both eyes daily.     methocarbamol  (ROBAXIN ) 500 MG tablet Take 1 tablet (500 mg total) by mouth every 8 (eight) hours as needed for muscle spasms. 90 tablet 3   mometasone-formoterol (DULERA ) 200-5 MCG/ACT AERO Inhale 2 puffs into the lungs 2 (two) times daily. (Patient taking differently: Inhale 2 puffs into the lungs 2 (two) times daily as needed for wheezing or shortness of breath.) 39 g 1   montelukast  (SINGULAIR ) 10 MG tablet Take 1 tablet (10 mg total) by mouth at bedtime. 90 tablet 1   Olopatadine  HCl (PATADAY ) 0.2 % SOLN Place 1 drop into both eyes daily as needed. 7.5 mL 1   tretinoin  (RETIN-A ) 0.05 % cream Apply topically every other day. Apply pea size amount to face at bedtime every other night. Apply quarter size amount mixed with moisturizer and apply to back and buttocks every other night. 45 g 0   valsartan  (DIOVAN ) 80 MG tablet Take 1 tablet (80 mg total) by mouth daily. 90 tablet 1   Vitamin D3 (VITAMIN D) 25 MCG tablet Take 1,000 Units by mouth daily.     ACCU-CHEK GUIDE TEST test strip USE TO CHECK BLOOD SUGAR ONCE DAILY. E11.69 100 strip 0   furosemide  (LASIX ) 20 MG tablet Take 1 tablet (20 mg total) by mouth daily. (Patient not  taking: Reported on 02/29/2024) 90 tablet 1   Misc. Devices MISC Back brace. Diagnosis chronic back pain 1 each 0   Misc. Devices MISC Left  knee brace. Diagnosis L knee pain 1 each 0    Results for orders placed or performed during the hospital encounter of 04/25/24 (from the past 48 hours)  Glucose, capillary     Status: Abnormal   Collection Time: 04/25/24  9:38 AM  Result Value Ref Range   Glucose-Capillary 107 (H) 70 - 99 mg/dL    Comment: Glucose reference range applies only to samples taken after fasting for at least 8 hours.   *Note: Due to a large number of results and/or encounters for the requested time period, some results have not been displayed. A complete set of results can be found in Results Review.   No results found.  Review of Systems  Musculoskeletal:  Positive for arthralgias.  All other systems reviewed and are negative.   Blood pressure (!) 148/95, pulse 80, temperature 97.7 F (36.5 C), temperature source Oral, resp. rate 20, height 6' 1 (1.854 m), weight 110.2 kg, SpO2 97%. Physical Exam Vitals reviewed.  HENT:     Head: Normocephalic.     Nose: Nose normal.  Eyes:     Pupils: Pupils are equal, round, and reactive to light.  Cardiovascular:     Rate and Rhythm: Normal rate.     Pulses: Normal pulses.  Pulmonary:     Effort: Pulmonary effort is normal.  Abdominal:     General: Abdomen is flat.  Musculoskeletal:     Cervical back: Normal range of motion.  Skin:    General: Skin is warm.     Capillary Refill: Capillary refill takes less than 2 seconds.  Neurological:     General: No focal deficit present.     Mental Status: He is alert.  Psychiatric:        Mood and Affect: Mood normal.     Ortho exam demonstrates good cervical spine range of motion. Does have subscap weakness on the right at 4-5 compared to the left 5+ out of 5. External rotation strength 5+ out of 5 bilaterally. No discrete AC joint tenderness right versus left. Does have a  little bit of popping with internal/external Tatian of the right side. Motor and sensory function of the hands intact. No Popeye deformity is present. O'Brien's testing is positive on the right negative on the. Does have bicipital groove tenderness on the right compared to the left. Passive range of motion on the right is 70/90/165. Patient is able to achieve forward flexion and abduction on both sides both above 90 degrees.  Assessment/Plan  Impression is right shoulder rotator cuff tear and biceps tendinitis and tendinopathy.  Does have a little acromial spurring as well. He has significant AC joint arthritis on the scan is not particularly clinically symptomatic.  Plan at this time is right shoulder arthroscopy with debridement of the superior labrum which has some degenerative changes as well in the biceps tendon.  Mini open biceps tenodesis and rotator cuff tear repair of the subscapularis.  Because he is a smoker we may use a patch of dermal allograft.  The risk and benefits of surgery are discussed with the patient including allergies to infection nerve or vessel damage rotator cuff retear, shoulder stiffness, prolonged recovery.  Patient understands risk benefits and wishes to proceed.  All questions answered .SABRA   The  mobility and security of the subscap repair Jason dictate his rehabilitation.  We Jason definitely try to preserve the coracoacromial ligament to prevent anterior superior escape in the future should the subscap repair fail. KANDICE Glendia Hutchinson,  MD 04/25/2024, 10:58 AM

## 2024-04-25 NOTE — Brief Op Note (Signed)
   04/25/2024  2:57 PM  PATIENT:  Jason Stokes  63 y.o. male  PRE-OPERATIVE DIAGNOSIS:  right shoulder subscapularis tear, biceps tendonitis  POST-OPERATIVE DIAGNOSIS:  right shoulder subscapularis tear, biceps tendonitis  PROCEDURE:  Procedure(s): RIGHT SHOULDER ARTHROSCOPY WITH DEBRIDEMENT AND SUBACROMIAL DECOMPRESSION OPEN REPAIR RIGHT SHOULDER SUBSCAPULARIS, patch augment  SURGEON:  Surgeon(s): Addie, Cordella Hamilton, MD  ASSISTANT: magnant pa  ANESTHESIA:   general  EBL: 25 ml    Total I/O In: 100 [IV Piggyback:100] Out: -   BLOOD ADMINISTERED: none  DRAINS: none   LOCAL MEDICATIONS USED:  none  SPECIMEN:  No Specimen  COUNTS:  YES  TOURNIQUET:  * No tourniquets in log *  DICTATION: .Other Dictation: Dictation Number [id number]  PLAN OF CARE: Discharge to home after PACU  PATIENT DISPOSITION:  PACU - hemodynamically stable

## 2024-04-25 NOTE — Anesthesia Procedure Notes (Signed)
 Procedure Name: Intubation Date/Time: 04/25/2024 12:01 PM  Performed by: Anelly Samarin C, CRNAPre-anesthesia Checklist: Patient identified, Emergency Drugs available, Suction available and Patient being monitored Patient Re-evaluated:Patient Re-evaluated prior to induction Oxygen Delivery Method: Circle system utilized Preoxygenation: Pre-oxygenation with 100% oxygen Induction Type: IV induction Ventilation: Mask ventilation without difficulty Laryngoscope Size: Mac and 4 Tube type: Oral Tube size: 7.5 mm Number of attempts: 1 Airway Equipment and Method: Stylet and Oral airway Placement Confirmation: ETT inserted through vocal cords under direct vision, positive ETCO2 and breath sounds checked- equal and bilateral Secured at: 23 cm Tube secured with: Tape Dental Injury: Teeth and Oropharynx as per pre-operative assessment

## 2024-04-25 NOTE — Transfer of Care (Signed)
 Immediate Anesthesia Transfer of Care Note  [name]  Procedure(s) Performed: RIGHT SHOULDER ARTHROSCOPY WITH DEBRIDEMENT AND SUBACROMIAL DECOMPRESSION (Right: Shoulder) OPEN REPAIR RIGHT SHOULDER SUBSCAPULARIS (Right: Shoulder)  Patient Location: PACU  Anesthesia Type:General and Regional  Level of Consciousness: awake, alert , and sedated  Airway & Oxygen Therapy: Patient Spontanous Breathing and Patient connected to face mask oxygen  Post-op Assessment: Report given to RN and Post -op Vital signs reviewed and stable  Post vital signs: Reviewed and stable  Last Vitals:  Vitals Value Taken Time  BP 118/75   Temp    Pulse 86 04/25/24 15:06  Resp 20 04/25/24 15:06  SpO2 99 % 04/25/24 15:06  Vitals shown include unfiled device data.  Last Pain:  Vitals:   04/25/24 1006  TempSrc:   PainSc: 9       Patients Stated Pain Goal: 2 (04/25/24 1006)  Complications: No notable events documented.

## 2024-04-26 ENCOUNTER — Encounter (HOSPITAL_COMMUNITY): Payer: Self-pay | Admitting: Orthopedic Surgery

## 2024-04-26 ENCOUNTER — Telehealth: Payer: Self-pay | Admitting: Orthopedic Surgery

## 2024-04-26 DIAGNOSIS — I639 Cerebral infarction, unspecified: Secondary | ICD-10-CM | POA: Diagnosis not present

## 2024-04-26 NOTE — Telephone Encounter (Signed)
 LMOM for patient that Rx was called into his pharmacy

## 2024-04-26 NOTE — Telephone Encounter (Signed)
 Pt called wanting to know if meds have been called in.Pharmacy is [facility] on [address] Pt call back number is [phone]

## 2024-04-27 DIAGNOSIS — I639 Cerebral infarction, unspecified: Secondary | ICD-10-CM | POA: Diagnosis not present

## 2024-04-28 DIAGNOSIS — I639 Cerebral infarction, unspecified: Secondary | ICD-10-CM | POA: Diagnosis not present

## 2024-04-29 ENCOUNTER — Telehealth: Payer: Self-pay | Admitting: Orthopedic Surgery

## 2024-04-29 DIAGNOSIS — I639 Cerebral infarction, unspecified: Secondary | ICD-10-CM | POA: Diagnosis not present

## 2024-04-29 NOTE — Telephone Encounter (Signed)
 Pt called wanting to know the status of his refill. Says he spoke with the insurance company [employer]) and she was supposed to email approval on Sat. Pt call back number is [phone]

## 2024-04-30 DIAGNOSIS — H3581 Retinal edema: Secondary | ICD-10-CM | POA: Diagnosis not present

## 2024-04-30 DIAGNOSIS — Z961 Presence of intraocular lens: Secondary | ICD-10-CM | POA: Diagnosis not present

## 2024-04-30 DIAGNOSIS — H30033 Focal chorioretinal inflammation, peripheral, bilateral: Secondary | ICD-10-CM | POA: Diagnosis not present

## 2024-04-30 DIAGNOSIS — I639 Cerebral infarction, unspecified: Secondary | ICD-10-CM | POA: Diagnosis not present

## 2024-04-30 DIAGNOSIS — Z79899 Other long term (current) drug therapy: Secondary | ICD-10-CM | POA: Diagnosis not present

## 2024-04-30 DIAGNOSIS — H4043X2 Glaucoma secondary to eye inflammation, bilateral, moderate stage: Secondary | ICD-10-CM | POA: Diagnosis not present

## 2024-05-01 DIAGNOSIS — I639 Cerebral infarction, unspecified: Secondary | ICD-10-CM | POA: Diagnosis not present

## 2024-05-02 ENCOUNTER — Ambulatory Visit (INDEPENDENT_AMBULATORY_CARE_PROVIDER_SITE_OTHER): Admitting: Surgical

## 2024-05-02 ENCOUNTER — Encounter: Payer: Self-pay | Admitting: Surgical

## 2024-05-02 DIAGNOSIS — Z9889 Other specified postprocedural states: Secondary | ICD-10-CM

## 2024-05-02 DIAGNOSIS — I639 Cerebral infarction, unspecified: Secondary | ICD-10-CM | POA: Diagnosis not present

## 2024-05-02 NOTE — Telephone Encounter (Signed)
Pt filled.

## 2024-05-02 NOTE — Progress Notes (Signed)
 Post-Op Visit Note   [name]           Date of Birth: June 10, 1961           MRN: [id number] Visit Date: 05/02/2024 PCP: Delbert Clam, MD   Assessment & Plan:  Chief Complaint:  Chief Complaint  Patient presents with   Right Shoulder - Pain    04/25/2024- Right Shoulder Arthroscopy With Debridement And Subacromial Decompression - Right  Open Repair Right Shoulder Subscapularis      Visit Diagnoses:  1. S/P right rotator cuff repair     Plan: DEEGAN VALENTINO is a 63 y.o. male who presents s/p right shoulder rotator cuff repair of subscapularis with patch augmentation on 04/25/2024.  Patient is doing well and pain is overall controlled.  Doing some elbow range of motion exercises.  He has also been doing some Thera-Band bicep curls.  Denies any chest pain, SOB, fevers, chills. Taking oxycodone  for pain control.  On exam, patient has range of motion 30 degrees X rotation, 60 degrees abduction, 80 degrees forward elevation passively.  Intact EPL, FPL, finger abduction, finger adduction, pronation/supination, bicep, tricep, deltoid of operative extremity.  Axillary nerve intact with deltoid firing.  Incisions are healing well without evidence of infection or dehiscence.  Sutures removed and replaced with Steri-Strips today.  2+ radial pulse of the operative extremity  Plan is continue with elbow range of motion exercises.  Strongly discouraged patient against any lifting or any resistant band exercises.  We will plan to continue with range of motion of the elbow and continue with sling immobilization and follow-up in 2 weeks for clinical recheck and initiation of more passive range of motion of the operative shoulder..   Follow-Up Instructions: No follow-ups on file.   Orders:  No orders of the defined types were placed in this encounter.  No orders of the defined types were placed in this encounter.   Imaging: No results found.  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, moderate stage 03/28/2022   Peripheral focal chorioretinal inflammation of both eyes 03/28/2022   Pseudophakia of both eyes 03/28/2022   Retinal edema 03/28/2022   Hypercoagulable state due to paroxysmal atrial fibrillation (HCC) 03/23/2021   Type 2 diabetes mellitus with hyperglycemia, without long-term current use of insulin (HCC) 02/10/2021   Lumbar stenosis with neurogenic claudication 06/01/2020   Aneurysm of iliac artery 05/28/2020   Neck pain on left side 03/06/2019   Musculoskeletal chest pain 07/24/2018   Asthma, mild intermittent 05/02/2018   Allergic rhinitis caused by mold 05/02/2018   Tobacco use 05/02/2018   Primary osteoarthritis of both hands 01/26/2018   Bilateral carpal tunnel syndrome 01/10/2018   Stroke (HCC)    Schizophrenia (HCC)    Migraine    History of nuclear stress test    History of alcohol abuse    GERD (gastroesophageal reflux disease)    Dysrhythmia    Chronic lower back pain    Anxiety  Arthritis of knee, right 04/12/2017   Bipolar disorder (HCC) 04/03/2017   Lipoma of forehead 09/22/2016   Trigger ring finger of right hand 06/22/2016   Paroxysmal atrial fibrillation (HCC)    AF (atrial fibrillation) (HCC) 12/10/2015   Eczema 07/10/2015   History of CVA (cerebrovascular accident) 05/04/2015   Tear of medial meniscus of right knee 03/18/2015   Chronic pain of right knee 03/12/2015   Other and unspecified hyperlipidemia  11/25/2013   Nasal congestion 11/25/2013   Sinusitis, chronic 05/09/2013   Chronic low back pain 10/12/2012   Lumbar radiculopathy 10/12/2012   Hypertension associated with diabetes (HCC) 10/12/2012   Depression 10/12/2012   Insomnia 10/12/2012   Past Medical History:  Diagnosis Date   Anemia    Aneurysm of right internal iliac artery    a.) s/p embolization 08/19/2020: 29 mm RIGHT internal iliac artery aneurysm   Anxiety    Aortic atherosclerosis    Bilateral carpal tunnel syndrome 01/10/2018   Bipolar disorder (HCC)    Chorioretinal inflammation of both eyes    a.) on azothioprine   Chronic lower back pain    Coronary artery calcification seen on CT scan    a.) cCTA 04/21/2022: Ca score 20.6 (61st percentile for age/sex match control)   DDD (degenerative disc disease), cervical    Depression    Diastolic dysfunction    a.) TTE 01/13/2021: EF 60-65%, mod LVH, triv MR, G1DD   GERD (gastroesophageal reflux disease)    Hepatic steatosis    History of alcohol abuse    History of nuclear stress test    Myoview 10/16: EF 50%, diaphragmatic attenuation, no ischemia, low risk   Hypertension    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   a.) CT head 12/25/2012 --> RIGHT basal ganglia hypoattenuation related to remote lacunar infarct   Lipoma    Long term (current) use of anticoagulants    a.) apixaban    Long-term current use of immunomodulator    a.) on azothioprine for peripheral focal chorioretinal inflammation (both eyes)   Marijuana use    Migraine    Moderate persistent asthma with acute exacerbation 05/02/2018   PAF (paroxysmal atrial fibrillation) (HCC) 03/27/2015   a.) CHA2DS2VASc = 5 (HTN, CVA x2, vascular disease history, T2DM);  b.) s/p ablation 10/02/2015; c.) s/p ablation 12/10/2015; d.) s/p DCCV (200 J x 1) 12/11/2015; e.) s/p ablation 04/28/2022; f.) rate/rhythm maintained on oral diltiazem  + carvedilol ; chronically anticoagulated with apixaban    Pilonidal cyst     Schizophrenia (HCC)    Sciatica neuralgia    Stroke (HCC)    T2DM (type 2 diabetes mellitus) (HCC)    Thoracic aortic ectasia 01/13/2021   a.) TTE 01/13/2021: Ao root 38 mm, asc Ao 39 mm    Family History  Problem Relation Age of Onset   Heart disease Father    Schizophrenia Sister     Past Surgical History:  Procedure Laterality Date   ATRIAL FIBRILLATION ABLATION  09/22/2015   ATRIAL FIBRILLATION ABLATION N/A 04/28/2022   Procedure: ATRIAL FIBRILLATION ABLATION;  Surgeon: Inocencio Soyla Lunger, MD;  [facility]: [facility];  Service: Cardiovascular;  Laterality: N/A;   CATARACT EXTRACTION Bilateral    CYST EXCISION  1996-97   surgery back of head    ELECTROPHYSIOLOGIC STUDY N/A 09/22/2015   Procedure: Atrial Fibrillation Ablation;  Surgeon: Will Lunger Inocencio, MD;  Location: [facility];  Service: Cardiovascular;  Laterality: N/A;   ELECTROPHYSIOLOGIC STUDY N/A 12/10/2015   Procedure: Atrial Fibrillation Ablation;  Surgeon: Will Gladis Norton, MD;  [facility]: [facility];  Service: Cardiovascular;  Laterality: N/A;   ELECTROPHYSIOLOGIC STUDY N/A 12/11/2015   Procedure: Cardioversion;  Surgeon: Will Gladis Norton, MD;  [facility];  Service: Cardiovascular;  Laterality: N/A;   EMBOLIZATION (CATH LAB) Right 08/19/2020   Procedure: EMBOLIZATION;  Surgeon: Magda Debby SAILOR, MD;  [facility]: [facility];  Service: Cardiovascular;  Laterality: Right;  hypogastric   EXCISION MASS HEAD N/A 01/06/2017   Procedure: EXCISION MASS FOREHEAD;  Surgeon: Arelia Filippo, MD;  Location: [facility];  Service: Plastics;  Laterality: N/A;   EXCISION MASS UPPER EXTREMETIES Right 08/08/2022   Procedure: EXCISION MASS RIGHT FOREARM;  Surgeon: Murrell Drivers, MD;  Location: [facility];  Service: Orthopedics;  Laterality: Right;  45 MIN   GANGLION CYST EXCISION Left    INTERCOSTAL NERVE BLOCK  07/19/2003   KNEE ARTHROSCOPY Right  07/18/2014   MASS EXCISION N/A 01/25/2021   Procedure: EXCISION SUBCUTANEOUS VS SUBFASCIAL MASS TORSO 3CM;  Surgeon: Arelia Filippo, MD;  Location: [facility];  Service: Plastics;  Laterality: N/A;   OPEN SUBSCAPULARIS REPAIR Right 04/25/2024   Procedure: OPEN REPAIR RIGHT SHOULDER SUBSCAPULARIS;  Surgeon: Addie Cordella Hamilton, MD;  Location: [facility];  Service: Orthopedics;  Laterality: Right;   PILONIDAL CYST EXCISION N/A 09/13/2022   Procedure: CYST EXCISION PILONIDAL EXTENSIVE;  Surgeon: Desiderio Schanz, MD;  Location: [facility];  Service: General;  Laterality: N/A;   POSTERIOR LUMBAR FUSION 2 WITH HARDWARE REMOVAL Right 04/25/2024   Procedure: RIGHT SHOULDER ARTHROSCOPY WITH DEBRIDEMENT AND SUBACROMIAL DECOMPRESSION;  Surgeon: Addie Cordella Hamilton, MD;  Location: [facility];  Service: Orthopedics;  Laterality: Right;   SHOULDER ARTHROSCOPY WITH ROTATOR CUFF REPAIR AND OPEN BICEPS TENODESIS Left 07/26/2023   Procedure: LEFT SHOULDER ARTHROSCOPY WITH OPEN ROTATOR CUFF REPAIR AND OPEN BICEPS TENODESIS;  Surgeon: Barbarann Oneil BROCKS, MD;  Location: [facility];  Service: Orthopedics;  Laterality: Left;   Social History   Occupational History    Comment: disabled  Tobacco Use   Smoking status: Every Day    Current packs/day: 0.25    Average packs/day: 0.3 packs/day for 0.5 years (0.1 ttl pk-yrs)    Types: Cigarettes    Start date: 09/10/1993    Last attempt to quit: 09/10/2022    Passive exposure: Never   Smokeless tobacco: Never   Tobacco comments:    1 cigarettes every day  06-02-2021  Vaping Use   Vaping status: Never Used  Substance and Sexual Activity   Alcohol use: Yes    Alcohol/week: 6.0 standard drinks of alcohol    Types: 6 Cans of beer per week   Drug use: Not Currently    Types: Marijuana    Comment: Uses daily   Sexual activity: Not Currently

## 2024-05-02 NOTE — Anesthesia Postprocedure Evaluation (Signed)
 Anesthesia Post Note  [name]  Procedure(s) Performed: RIGHT SHOULDER ARTHROSCOPY WITH DEBRIDEMENT AND SUBACROMIAL DECOMPRESSION (Right: Shoulder) OPEN REPAIR RIGHT SHOULDER SUBSCAPULARIS (Right: Shoulder)     Patient location during evaluation: PACU Anesthesia Type: Regional and General Level of consciousness: awake and alert Pain management: pain level controlled Vital Signs Assessment: post-procedure vital signs reviewed and stable Respiratory status: spontaneous breathing, nonlabored ventilation and respiratory function stable Cardiovascular status: blood pressure returned to baseline and stable Postop Assessment: no apparent nausea or vomiting Anesthetic complications: no   No notable events documented.                  Astraea Gaughran

## 2024-05-03 ENCOUNTER — Ambulatory Visit (INDEPENDENT_AMBULATORY_CARE_PROVIDER_SITE_OTHER)

## 2024-05-03 DIAGNOSIS — J309 Allergic rhinitis, unspecified: Secondary | ICD-10-CM

## 2024-05-03 DIAGNOSIS — I639 Cerebral infarction, unspecified: Secondary | ICD-10-CM | POA: Diagnosis not present

## 2024-05-04 DIAGNOSIS — I639 Cerebral infarction, unspecified: Secondary | ICD-10-CM | POA: Diagnosis not present

## 2024-05-05 DIAGNOSIS — I639 Cerebral infarction, unspecified: Secondary | ICD-10-CM | POA: Diagnosis not present

## 2024-05-06 DIAGNOSIS — I639 Cerebral infarction, unspecified: Secondary | ICD-10-CM | POA: Diagnosis not present

## 2024-05-07 DIAGNOSIS — I639 Cerebral infarction, unspecified: Secondary | ICD-10-CM | POA: Diagnosis not present

## 2024-05-08 DIAGNOSIS — I639 Cerebral infarction, unspecified: Secondary | ICD-10-CM | POA: Diagnosis not present

## 2024-05-09 DIAGNOSIS — I639 Cerebral infarction, unspecified: Secondary | ICD-10-CM | POA: Diagnosis not present

## 2024-05-10 ENCOUNTER — Telehealth: Payer: Self-pay | Admitting: Orthopedic Surgery

## 2024-05-10 ENCOUNTER — Ambulatory Visit (INDEPENDENT_AMBULATORY_CARE_PROVIDER_SITE_OTHER)

## 2024-05-10 DIAGNOSIS — I639 Cerebral infarction, unspecified: Secondary | ICD-10-CM | POA: Diagnosis not present

## 2024-05-10 DIAGNOSIS — J309 Allergic rhinitis, unspecified: Secondary | ICD-10-CM | POA: Diagnosis not present

## 2024-05-10 NOTE — Telephone Encounter (Signed)
 Patient called and needs a refill on pain medication. [phone] and to call when sent to the pharmacy. [phone]

## 2024-05-10 NOTE — Telephone Encounter (Signed)
  Right Shoulder - Pain       04/25/2024- Right Shoulder Arthroscopy With Debridement And Subacromial Decompression - Right  Open Repair Right Shoulder Subscapularis

## 2024-05-11 DIAGNOSIS — M65911 Unspecified synovitis and tenosynovitis, right shoulder: Secondary | ICD-10-CM

## 2024-05-11 DIAGNOSIS — I639 Cerebral infarction, unspecified: Secondary | ICD-10-CM | POA: Diagnosis not present

## 2024-05-11 DIAGNOSIS — S43431A Superior glenoid labrum lesion of right shoulder, initial encounter: Secondary | ICD-10-CM

## 2024-05-12 DIAGNOSIS — I639 Cerebral infarction, unspecified: Secondary | ICD-10-CM | POA: Diagnosis not present

## 2024-05-13 ENCOUNTER — Other Ambulatory Visit: Payer: Self-pay | Admitting: Surgical

## 2024-05-13 DIAGNOSIS — I639 Cerebral infarction, unspecified: Secondary | ICD-10-CM | POA: Diagnosis not present

## 2024-05-13 MED ORDER — OXYCODONE HCL 5 MG PO TABS: 5.0000 mg | ORAL_TABLET | Freq: Four times a day (QID) | ORAL | 0 refills | Status: DC | PRN

## 2024-05-13 NOTE — Telephone Encounter (Signed)
 Sent in refill

## 2024-05-14 DIAGNOSIS — I639 Cerebral infarction, unspecified: Secondary | ICD-10-CM | POA: Diagnosis not present

## 2024-05-15 DIAGNOSIS — I639 Cerebral infarction, unspecified: Secondary | ICD-10-CM | POA: Diagnosis not present

## 2024-05-16 DIAGNOSIS — I639 Cerebral infarction, unspecified: Secondary | ICD-10-CM | POA: Diagnosis not present

## 2024-05-17 ENCOUNTER — Other Ambulatory Visit (INDEPENDENT_AMBULATORY_CARE_PROVIDER_SITE_OTHER): Payer: Self-pay

## 2024-05-17 ENCOUNTER — Ambulatory Visit (INDEPENDENT_AMBULATORY_CARE_PROVIDER_SITE_OTHER): Admitting: Surgical

## 2024-05-17 ENCOUNTER — Other Ambulatory Visit: Payer: Self-pay | Admitting: Family Medicine

## 2024-05-17 DIAGNOSIS — Z9889 Other specified postprocedural states: Secondary | ICD-10-CM | POA: Diagnosis not present

## 2024-05-17 DIAGNOSIS — E1169 Type 2 diabetes mellitus with other specified complication: Secondary | ICD-10-CM

## 2024-05-17 DIAGNOSIS — I639 Cerebral infarction, unspecified: Secondary | ICD-10-CM | POA: Diagnosis not present

## 2024-05-17 NOTE — Telephone Encounter (Signed)
 Copied from CRM [id number]. Topic: Clinical - Medication Refill >> May 17, 2024  1:23 PM Rachelle R wrote: Medication: ACCU-CHEK GUIDE TEST test strip  Has the patient contacted their pharmacy? Yes, call dr  This is the patient's preferred pharmacy:  [id number] - [place], [place] - [address] AT [facility] [address] [place] [address] Phone: [phone] Fax: [phone]  Is this the correct pharmacy for this prescription? Yes If no, delete pharmacy and type the correct one.   Has the prescription been filled recently? Yes  Is the patient out of the medication? Yes  Has the patient been seen for an appointment in the last year OR does the patient have an upcoming appointment? Yes  Can we respond through MyChart? Yes  Agent: Please be advised that Rx refills may take up to 3 business days. We ask that you follow-up with your pharmacy.

## 2024-05-18 DIAGNOSIS — I639 Cerebral infarction, unspecified: Secondary | ICD-10-CM | POA: Diagnosis not present

## 2024-05-19 ENCOUNTER — Encounter: Payer: Self-pay | Admitting: Surgical

## 2024-05-19 DIAGNOSIS — I639 Cerebral infarction, unspecified: Secondary | ICD-10-CM | POA: Diagnosis not present

## 2024-05-19 NOTE — Progress Notes (Signed)
 Post-Op Visit Note   [name]           Date of Birth: April 22, 1961           MRN: [id number] Visit Date: 05/17/2024 PCP: Delbert Clam, MD   Assessment & Plan:  Chief Complaint:  Chief Complaint  Patient presents with   Right Shoulder - Routine Post Op, Follow-up    04/25/2024 right shoulder arthroscopy with debridement and subacromial decompression, open repair of right shoulder subscap   Visit Diagnoses:  1. S/P right rotator cuff repair     Plan: Patient is a 63 year old male who presents s/p right shoulder arthroscopy with open subscap repair on 04/25/2024.  He states that he thinks something is not right.  He feels like an anchor his backing out.  Having some sharp pain and throbbing sensation in the anterior aspect of the shoulder.  He is concerned because there is some prominence and raising of the inferior aspect of the incision.  Wearing sling most of the time and has stopped doing the weight lifting exercises he was doing prior to the last appointment.  Taking oxycodone  5 mg for pain control.  On exam, patient has no crepitus noted passive motion of the shoulder.  Actually has pretty good early range of motion with 15 degrees X rotation, 60 degrees abduction, 90 degrees forward elevation passively.  Good subscap strength rated 5 -/5 with X rotation strength rated 5/5.  Has axillary nerve intact with deltoid firing.  Intact EPL, FPL, finger abduction.  No Popeye deformity noted.  No weakness or significant pain noted with bicep flexion or supination strength testing.  Plan at this time is continue with sling immobilization for another 2 weeks.  He will continue with his pendulum exercises and elbow range of motion exercises and avoiding any lifting.  Follow-up in 3 weeks for clinical recheck and initiation of PT at that time.  Follow-Up Instructions: Return in about 3 weeks (around 06/07/2024), or with dr dean.   Orders:  Orders Placed This Encounter   Procedures   XR Shoulder Right   No orders of the defined types were placed in this encounter.   Imaging: No results found.  PMFS History: Patient Active Problem List   Diagnosis Date Noted   Degenerative superior labral anterior-to-posterior (SLAP) tear of right shoulder 05/11/2024   Synovitis of right shoulder 05/11/2024   Chronic anticoagulation 02/19/2024   Shoulder joint pain 02/19/2024   Tear of left rotator cuff 02/19/2024   Impingement syndrome of right shoulder 10/03/2023   Tendinitis of upper biceps tendon of left shoulder 06/12/2023   Complete tear of right rotator cuff 10/28/2022   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   Pilonidal cyst 09/13/2022   Aortic atherosclerosis 06/07/2022   Mass of right upper extremity 03/30/2022   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, moderate stage 03/28/2022   Peripheral focal chorioretinal inflammation of both eyes 03/28/2022   Pseudophakia of both eyes 03/28/2022   Retinal edema 03/28/2022   Hypercoagulable state due to paroxysmal atrial fibrillation (HCC) 03/23/2021   Type 2 diabetes mellitus with hyperglycemia, without long-term current use of insulin (HCC) 02/10/2021   Lumbar stenosis with neurogenic claudication 06/01/2020   Aneurysm of iliac artery 05/28/2020   Neck pain on left side 03/06/2019   Musculoskeletal chest pain 07/24/2018   Asthma, mild intermittent 05/02/2018   Allergic rhinitis caused by mold 05/02/2018   Tobacco use 05/02/2018   Primary osteoarthritis of both hands 01/26/2018  Bilateral carpal tunnel syndrome 01/10/2018   Stroke (HCC)    Schizophrenia (HCC)    Migraine    History of nuclear stress test    History of alcohol abuse    GERD (gastroesophageal reflux disease)    Dysrhythmia    Chronic lower back pain    Anxiety    Arthritis of knee, right 04/12/2017   Bipolar disorder (HCC) 04/03/2017   Lipoma of forehead 09/22/2016   Trigger ring finger of right hand  06/22/2016   Paroxysmal atrial fibrillation (HCC)    AF (atrial fibrillation) (HCC) 12/10/2015   Eczema 07/10/2015   History of CVA (cerebrovascular accident) 05/04/2015   Tear of medial meniscus of right knee 03/18/2015   Chronic pain of right knee 03/12/2015   Other and unspecified hyperlipidemia 11/25/2013   Nasal congestion 11/25/2013   Sinusitis, chronic 05/09/2013   Chronic low back pain 10/12/2012   Lumbar radiculopathy 10/12/2012   Hypertension associated with diabetes (HCC) 10/12/2012   Depression 10/12/2012   Insomnia 10/12/2012   Past Medical History:  Diagnosis Date   Anemia    Aneurysm of right internal iliac artery    a.) s/p embolization 08/19/2020: 29 mm RIGHT internal iliac artery aneurysm   Anxiety    Aortic atherosclerosis    Bilateral carpal tunnel syndrome 01/10/2018   Bipolar disorder (HCC)    Chorioretinal inflammation of both eyes    a.) on azothioprine   Chronic lower back pain    Coronary artery calcification seen on CT scan    a.) cCTA 04/21/2022: Ca score 20.6 (61st percentile for age/sex match control)   DDD (degenerative disc disease), cervical    Depression    Diastolic dysfunction    a.) TTE 01/13/2021: EF 60-65%, mod LVH, triv MR, G1DD   GERD (gastroesophageal reflux disease)    Hepatic steatosis    History of alcohol abuse    History of nuclear stress test    Myoview 10/16: EF 50%, diaphragmatic attenuation, no ischemia, low risk   Hypertension    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   a.) CT head 12/25/2012 --> RIGHT basal ganglia hypoattenuation related to remote lacunar infarct   Lipoma    Long term (current) use of anticoagulants    a.) apixaban    Long-term current use of immunomodulator    a.) on azothioprine for peripheral focal chorioretinal inflammation (both eyes)   Marijuana use    Migraine    Moderate persistent asthma with acute exacerbation 05/02/2018   PAF (paroxysmal atrial fibrillation) (HCC) 03/27/2015   a.)  CHA2DS2VASc = 5 (HTN, CVA x2, vascular disease history, T2DM);  b.) s/p ablation 10/02/2015; c.) s/p ablation 12/10/2015; d.) s/p DCCV (200 J x 1) 12/11/2015; e.) s/p ablation 04/28/2022; f.) rate/rhythm maintained on oral diltiazem  + carvedilol ; chronically anticoagulated with apixaban    Pilonidal cyst    Schizophrenia (HCC)    Sciatica neuralgia    Stroke (HCC)    T2DM (type 2 diabetes mellitus) (HCC)    Thoracic aortic ectasia 01/13/2021   a.) TTE 01/13/2021: Ao root 38 mm, asc Ao 39 mm    Family History  Problem Relation Age of Onset   Heart disease Father    Schizophrenia Sister     Past Surgical History:  Procedure Laterality Date   ATRIAL FIBRILLATION ABLATION  09/22/2015   ATRIAL FIBRILLATION ABLATION N/A 04/28/2022   Procedure: ATRIAL FIBRILLATION ABLATION;  Surgeon: Inocencio Soyla Lunger, MD;  Location: [facility];  Service: Cardiovascular;  Laterality: N/A;   CATARACT EXTRACTION  Bilateral    CYST EXCISION  1996-97   surgery back of head    ELECTROPHYSIOLOGIC STUDY N/A 09/22/2015   Procedure: Atrial Fibrillation Ablation;  Surgeon: Will Gladis Norton, MD;  [facility]: [facility];  Service: Cardiovascular;  Laterality: N/A;   ELECTROPHYSIOLOGIC STUDY N/A 12/10/2015   Procedure: Atrial Fibrillation Ablation;  Surgeon: Will Gladis Norton, MD;  [facility];  Service: Cardiovascular;  Laterality: N/A;   ELECTROPHYSIOLOGIC STUDY N/A 12/11/2015   Procedure: Cardioversion;  Surgeon: Will Gladis Norton, MD;  [facility];  Service: Cardiovascular;  Laterality: N/A;   EMBOLIZATION (CATH LAB) Right 08/19/2020   Procedure: EMBOLIZATION;  Surgeon: Magda Debby SAILOR, MD;  [facility];  Service: Cardiovascular;  Laterality: Right;  hypogastric   EXCISION MASS HEAD N/A 01/06/2017   Procedure: EXCISION MASS FOREHEAD;  Surgeon: Arelia Filippo, MD;  Location: [facility];  Service: Plastics;  Laterality: N/A;    EXCISION MASS UPPER EXTREMETIES Right 08/08/2022   Procedure: EXCISION MASS RIGHT FOREARM;  Surgeon: Murrell Drivers, MD;  Location: [facility];  Service: Orthopedics;  Laterality: Right;  45 MIN   GANGLION CYST EXCISION Left    INTERCOSTAL NERVE BLOCK  07/19/2003   KNEE ARTHROSCOPY Right 07/18/2014   MASS EXCISION N/A 01/25/2021   Procedure: EXCISION SUBCUTANEOUS VS SUBFASCIAL MASS TORSO 3CM;  Surgeon: Arelia Filippo, MD;  Location: [facility];  Service: Plastics;  Laterality: N/A;   OPEN SUBSCAPULARIS REPAIR Right 04/25/2024   Procedure: OPEN REPAIR RIGHT SHOULDER SUBSCAPULARIS;  Surgeon: Addie Cordella Hamilton, MD;  Location: [facility];  Service: Orthopedics;  Laterality: Right;   PILONIDAL CYST EXCISION N/A 09/13/2022   Procedure: CYST EXCISION PILONIDAL EXTENSIVE;  Surgeon: Desiderio Schanz, MD;  Location: [facility];  Service: General;  Laterality: N/A;   POSTERIOR LUMBAR FUSION 2 WITH HARDWARE REMOVAL Right 04/25/2024   Procedure: RIGHT SHOULDER ARTHROSCOPY WITH DEBRIDEMENT AND SUBACROMIAL DECOMPRESSION;  Surgeon: Addie Cordella Hamilton, MD;  Location: [facility];  Service: Orthopedics;  Laterality: Right;   SHOULDER ARTHROSCOPY WITH ROTATOR CUFF REPAIR AND OPEN BICEPS TENODESIS Left 07/26/2023   Procedure: LEFT SHOULDER ARTHROSCOPY WITH OPEN ROTATOR CUFF REPAIR AND OPEN BICEPS TENODESIS;  Surgeon: Barbarann Oneil BROCKS, MD;  Location: [facility];  Service: Orthopedics;  Laterality: Left;   Social History   Occupational History    Comment: disabled  Tobacco Use   Smoking status: Every Day    Current packs/day: 0.25    Average packs/day: 0.3 packs/day for 0.5 years (0.1 ttl pk-yrs)    Types: Cigarettes    Start date: 09/10/1993    Last attempt to quit: 09/10/2022    Passive exposure: Never   Smokeless tobacco: Never   Tobacco comments:    1 cigarettes every day  06-02-2021  Vaping Use   Vaping status: Never Used  Substance and Sexual Activity   Alcohol use: Yes     Alcohol/week: 6.0 standard drinks of alcohol    Types: 6 Cans of beer per week   Drug use: Not Currently    Types: Marijuana    Comment: Uses daily   Sexual activity: Not Currently

## 2024-05-20 ENCOUNTER — Ambulatory Visit

## 2024-05-20 ENCOUNTER — Encounter: Payer: Self-pay | Admitting: Radiology

## 2024-05-20 ENCOUNTER — Ambulatory Visit: Attending: Family Medicine

## 2024-05-20 DIAGNOSIS — Z23 Encounter for immunization: Secondary | ICD-10-CM

## 2024-05-20 DIAGNOSIS — I639 Cerebral infarction, unspecified: Secondary | ICD-10-CM | POA: Diagnosis not present

## 2024-05-20 MED ORDER — ACCU-CHEK GUIDE TEST VI STRP: ORAL_STRIP | 1 refills | Status: AC

## 2024-05-20 NOTE — Progress Notes (Signed)
Flu vaccine administered per protocols.  Information sheet given. Patient denies and pain or discomfort at injection site. Tolerated injection well no reaction.  

## 2024-05-20 NOTE — Telephone Encounter (Signed)
 Requested medication (s) are due for refill today: yes  Requested medication (s) are on the active medication list: yes  Last refill:  n/a  Future visit scheduled: yes  Notes to clinic:  unable to attach to protocol     Requested Prescriptions  Pending Prescriptions Disp Refills   glucose blood (ACCU-CHEK GUIDE TEST) test strip 100 strip 0    Sig: Use as instructed     There is no refill protocol information for this order

## 2024-05-21 DIAGNOSIS — I639 Cerebral infarction, unspecified: Secondary | ICD-10-CM | POA: Diagnosis not present

## 2024-05-22 DIAGNOSIS — I639 Cerebral infarction, unspecified: Secondary | ICD-10-CM | POA: Diagnosis not present

## 2024-05-23 ENCOUNTER — Ambulatory Visit

## 2024-05-23 DIAGNOSIS — J309 Allergic rhinitis, unspecified: Secondary | ICD-10-CM

## 2024-05-23 DIAGNOSIS — I639 Cerebral infarction, unspecified: Secondary | ICD-10-CM | POA: Diagnosis not present

## 2024-05-24 DIAGNOSIS — I639 Cerebral infarction, unspecified: Secondary | ICD-10-CM | POA: Diagnosis not present

## 2024-05-25 DIAGNOSIS — I639 Cerebral infarction, unspecified: Secondary | ICD-10-CM | POA: Diagnosis not present

## 2024-05-26 DIAGNOSIS — I639 Cerebral infarction, unspecified: Secondary | ICD-10-CM | POA: Diagnosis not present

## 2024-05-27 DIAGNOSIS — I639 Cerebral infarction, unspecified: Secondary | ICD-10-CM | POA: Diagnosis not present

## 2024-05-28 DIAGNOSIS — I639 Cerebral infarction, unspecified: Secondary | ICD-10-CM | POA: Diagnosis not present

## 2024-05-29 DIAGNOSIS — I639 Cerebral infarction, unspecified: Secondary | ICD-10-CM | POA: Diagnosis not present

## 2024-05-30 DIAGNOSIS — I639 Cerebral infarction, unspecified: Secondary | ICD-10-CM | POA: Diagnosis not present

## 2024-05-31 DIAGNOSIS — I639 Cerebral infarction, unspecified: Secondary | ICD-10-CM | POA: Diagnosis not present

## 2024-06-01 DIAGNOSIS — I639 Cerebral infarction, unspecified: Secondary | ICD-10-CM | POA: Diagnosis not present

## 2024-06-02 ENCOUNTER — Other Ambulatory Visit: Payer: Self-pay | Admitting: Dermatology

## 2024-06-02 DIAGNOSIS — L700 Acne vulgaris: Secondary | ICD-10-CM

## 2024-06-02 DIAGNOSIS — L739 Follicular disorder, unspecified: Secondary | ICD-10-CM

## 2024-06-02 DIAGNOSIS — I639 Cerebral infarction, unspecified: Secondary | ICD-10-CM | POA: Diagnosis not present

## 2024-06-03 ENCOUNTER — Other Ambulatory Visit: Payer: Self-pay | Admitting: Surgical

## 2024-06-03 ENCOUNTER — Telehealth: Payer: Self-pay | Admitting: Orthopedic Surgery

## 2024-06-03 DIAGNOSIS — I639 Cerebral infarction, unspecified: Secondary | ICD-10-CM | POA: Diagnosis not present

## 2024-06-03 MED ORDER — OXYCODONE HCL 5 MG PO TABS: 5.0000 mg | ORAL_TABLET | Freq: Two times a day (BID) | ORAL | 0 refills | Status: AC | PRN

## 2024-06-03 NOTE — Telephone Encounter (Signed)
 Patient called and said he wants a refill on the oxycodone  and Tylenol  3 [phone]

## 2024-06-03 NOTE — Telephone Encounter (Signed)
 Refilled oxycodone  at lower frequency. Can't refill both meds

## 2024-06-04 DIAGNOSIS — I639 Cerebral infarction, unspecified: Secondary | ICD-10-CM | POA: Diagnosis not present

## 2024-06-05 ENCOUNTER — Ambulatory Visit (INDEPENDENT_AMBULATORY_CARE_PROVIDER_SITE_OTHER)

## 2024-06-05 DIAGNOSIS — J309 Allergic rhinitis, unspecified: Secondary | ICD-10-CM

## 2024-06-05 DIAGNOSIS — I639 Cerebral infarction, unspecified: Secondary | ICD-10-CM | POA: Diagnosis not present

## 2024-06-06 DIAGNOSIS — I639 Cerebral infarction, unspecified: Secondary | ICD-10-CM | POA: Diagnosis not present

## 2024-06-08 DIAGNOSIS — I639 Cerebral infarction, unspecified: Secondary | ICD-10-CM | POA: Diagnosis not present

## 2024-06-09 DIAGNOSIS — I639 Cerebral infarction, unspecified: Secondary | ICD-10-CM | POA: Diagnosis not present

## 2024-06-10 ENCOUNTER — Ambulatory Visit (INDEPENDENT_AMBULATORY_CARE_PROVIDER_SITE_OTHER): Admitting: Orthopedic Surgery

## 2024-06-10 DIAGNOSIS — Z9889 Other specified postprocedural states: Secondary | ICD-10-CM

## 2024-06-10 DIAGNOSIS — I639 Cerebral infarction, unspecified: Secondary | ICD-10-CM | POA: Diagnosis not present

## 2024-06-11 ENCOUNTER — Ambulatory Visit

## 2024-06-11 ENCOUNTER — Encounter: Payer: Self-pay | Admitting: Orthopedic Surgery

## 2024-06-11 DIAGNOSIS — J309 Allergic rhinitis, unspecified: Secondary | ICD-10-CM | POA: Diagnosis not present

## 2024-06-11 DIAGNOSIS — I639 Cerebral infarction, unspecified: Secondary | ICD-10-CM | POA: Diagnosis not present

## 2024-06-11 MED ORDER — OXYCODONE HCL 5 MG PO CAPS: 5.0000 mg | ORAL_CAPSULE | ORAL | 0 refills | Status: AC | PRN

## 2024-06-11 NOTE — Progress Notes (Signed)
 Post-Op Visit Note   [name]           Date of Birth: 05/28/1961           MRN: [id number] Visit Date: 06/10/2024 PCP: Delbert Clam, MD   Assessment & Plan:  Chief Complaint:  Chief Complaint  Patient presents with   Right Shoulder - Follow-up, Routine Post Op   Visit Diagnoses:  1. S/P right rotator cuff repair     Plan: Birney is a 63 year old patient who underwent right shoulder arthroscopy with open subscap repair augmented with patch.  He is doing his own physical therapy.  Taking oxycodone  for pain.  On exam he has range of motion of 50/80/120.  Subscap strength is 5- out of 5.  Would like him to start physical therapy for active assisted range of motion and passive range of motion of the right shoulder 2 times a week for 6 weeks.  Do not really want him to do any more external rotation than what he has now which is about 45 to 50 degrees.  Pain medicine refill x 1.  Follow-up with Herlene and 6 weeks for final check  Follow-Up Instructions: No follow-ups on file.   Orders:  No orders of the defined types were placed in this encounter.  No orders of the defined types were placed in this encounter.   Imaging: No results found.  PMFS History: Patient Active Problem List   Diagnosis Date Noted   Degenerative superior labral anterior-to-posterior (SLAP) tear of right shoulder 05/11/2024   Synovitis of right shoulder 05/11/2024   Chronic anticoagulation 02/19/2024   Shoulder joint pain 02/19/2024   Tear of left rotator cuff 02/19/2024   Impingement syndrome of right shoulder 10/03/2023   Tendinitis of upper biceps tendon of left shoulder 06/12/2023   Complete tear of right rotator cuff 10/28/2022   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   Pilonidal cyst 09/13/2022   Aortic atherosclerosis 06/07/2022   Mass of right upper extremity 03/30/2022   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, moderate stage 03/28/2022   Peripheral  focal chorioretinal inflammation of both eyes 03/28/2022   Pseudophakia of both eyes 03/28/2022   Retinal edema 03/28/2022   Hypercoagulable state due to paroxysmal atrial fibrillation (HCC) 03/23/2021   Type 2 diabetes mellitus with hyperglycemia, without long-term current use of insulin  (HCC) 02/10/2021   Lumbar stenosis with neurogenic claudication 06/01/2020   Aneurysm of iliac artery 05/28/2020   Neck pain on left side 03/06/2019   Musculoskeletal chest pain 07/24/2018   Asthma, mild intermittent 05/02/2018   Allergic rhinitis caused by mold 05/02/2018   Tobacco use 05/02/2018   Primary osteoarthritis of both hands 01/26/2018   Bilateral carpal tunnel syndrome 01/10/2018   Stroke (HCC)    Schizophrenia (HCC)    Migraine    History of nuclear stress test    History of alcohol abuse    GERD (gastroesophageal reflux disease)    Dysrhythmia    Chronic lower back pain    Anxiety    Arthritis of knee, right 04/12/2017   Bipolar disorder (HCC) 04/03/2017   Lipoma of forehead 09/22/2016   Trigger ring finger of right hand 06/22/2016   Paroxysmal atrial fibrillation (HCC)    AF (atrial fibrillation) (HCC) 12/10/2015   Eczema 07/10/2015   History of CVA (cerebrovascular accident) 05/04/2015   Tear of medial meniscus of right knee 03/18/2015   Chronic pain of right knee 03/12/2015   Other and unspecified hyperlipidemia  11/25/2013   Nasal congestion 11/25/2013   Sinusitis, chronic 05/09/2013   Chronic low back pain 10/12/2012   Lumbar radiculopathy 10/12/2012   Hypertension associated with diabetes (HCC) 10/12/2012   Depression 10/12/2012   Insomnia 10/12/2012   Past Medical History:  Diagnosis Date   Anemia    Aneurysm of right internal iliac artery    a.) s/p embolization 08/19/2020: 29 mm RIGHT internal iliac artery aneurysm   Anxiety    Aortic atherosclerosis    Bilateral carpal tunnel syndrome 01/10/2018   Bipolar disorder (HCC)    Chorioretinal inflammation of both  eyes    a.) on azothioprine   Chronic lower back pain    Coronary artery calcification seen on CT scan    a.) cCTA 04/21/2022: Ca score 20.6 (61st percentile for age/sex match control)   DDD (degenerative disc disease), cervical    Depression    Diastolic dysfunction    a.) TTE 01/13/2021: EF 60-65%, mod LVH, triv MR, G1DD   GERD (gastroesophageal reflux disease)    Hepatic steatosis    History of alcohol abuse    History of nuclear stress test    Myoview 10/16: EF 50%, diaphragmatic attenuation, no ischemia, low risk   Hypertension    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   a.) CT head 12/25/2012 --> RIGHT basal ganglia hypoattenuation related to remote lacunar infarct   Lipoma    Long term (current) use of anticoagulants    a.) apixaban    Long-term current use of immunomodulator    a.) on azothioprine for peripheral focal chorioretinal inflammation (both eyes)   Marijuana use    Migraine    Moderate persistent asthma with acute exacerbation 05/02/2018   PAF (paroxysmal atrial fibrillation) (HCC) 03/27/2015   a.) CHA2DS2VASc = 5 (HTN, CVA x2, vascular disease history, T2DM);  b.) s/p ablation 10/02/2015; c.) s/p ablation 12/10/2015; d.) s/p DCCV (200 J x 1) 12/11/2015; e.) s/p ablation 04/28/2022; f.) rate/rhythm maintained on oral diltiazem  + carvedilol ; chronically anticoagulated with apixaban    Pilonidal cyst    Schizophrenia (HCC)    Sciatica neuralgia    Stroke (HCC)    T2DM (type 2 diabetes mellitus) (HCC)    Thoracic aortic ectasia 01/13/2021   a.) TTE 01/13/2021: Ao root 38 mm, asc Ao 39 mm    Family History  Problem Relation Age of Onset   Heart disease Father    Schizophrenia Sister     Past Surgical History:  Procedure Laterality Date   ATRIAL FIBRILLATION ABLATION  09/22/2015   ATRIAL FIBRILLATION ABLATION N/A 04/28/2022   Procedure: ATRIAL FIBRILLATION ABLATION;  Surgeon: Inocencio Soyla Lunger, MD;  Location: [facility];  Service: Cardiovascular;   Laterality: N/A;   CATARACT EXTRACTION Bilateral    CYST EXCISION  1996-97   surgery back of head    ELECTROPHYSIOLOGIC STUDY N/A 09/22/2015   Procedure: Atrial Fibrillation Ablation;  Surgeon: Will Lunger Inocencio, MD;  Location: [facility];  Service: Cardiovascular;  Laterality: N/A;   ELECTROPHYSIOLOGIC STUDY N/A 12/10/2015   Procedure: Atrial Fibrillation Ablation;  Surgeon: Will Lunger Inocencio, MD;  Location: [facility];  Service: Cardiovascular;  Laterality: N/A;   ELECTROPHYSIOLOGIC STUDY N/A 12/11/2015   Procedure: Cardioversion;  Surgeon: Will Lunger Inocencio, MD;  Location: [facility];  Service: Cardiovascular;  Laterality: N/A;   EMBOLIZATION (CATH LAB) Right 08/19/2020   Procedure: EMBOLIZATION;  Surgeon: Magda Debby SAILOR, MD;  Location: [facility];  Service: Cardiovascular;  Laterality: Right;  hypogastric  EXCISION MASS HEAD N/A 01/06/2017   Procedure: EXCISION MASS FOREHEAD;  Surgeon: Arelia Filippo, MD;  Location: [facility];  Service: Plastics;  Laterality: N/A;   EXCISION MASS UPPER EXTREMETIES Right 08/08/2022   Procedure: EXCISION MASS RIGHT FOREARM;  Surgeon: Murrell Drivers, MD;  Location: [facility];  Service: Orthopedics;  Laterality: Right;  45 MIN   GANGLION CYST EXCISION Left    INTERCOSTAL NERVE BLOCK  07/19/2003   KNEE ARTHROSCOPY Right 07/18/2014   MASS EXCISION N/A 01/25/2021   Procedure: EXCISION SUBCUTANEOUS VS SUBFASCIAL MASS TORSO 3CM;  Surgeon: Arelia Filippo, MD;  Location:  SURGERY CENTER;  Service: Plastics;  Laterality: N/A;   OPEN SUBSCAPULARIS REPAIR Right 04/25/2024   Procedure: OPEN REPAIR RIGHT SHOULDER SUBSCAPULARIS;  Surgeon: Addie Cordella Hamilton, MD;  Location: [facility];  Service: Orthopedics;  Laterality: Right;   PILONIDAL CYST EXCISION N/A 09/13/2022   Procedure: CYST EXCISION PILONIDAL EXTENSIVE;  Surgeon: Desiderio Schanz, MD;  Location: [facility];  Service: General;   Laterality: N/A;   POSTERIOR LUMBAR FUSION 2 WITH HARDWARE REMOVAL Right 04/25/2024   Procedure: RIGHT SHOULDER ARTHROSCOPY WITH DEBRIDEMENT AND SUBACROMIAL DECOMPRESSION;  Surgeon: Addie Cordella Hamilton, MD;  Location: [facility];  Service: Orthopedics;  Laterality: Right;   SHOULDER ARTHROSCOPY WITH ROTATOR CUFF REPAIR AND OPEN BICEPS TENODESIS Left 07/26/2023   Procedure: LEFT SHOULDER ARTHROSCOPY WITH OPEN ROTATOR CUFF REPAIR AND OPEN BICEPS TENODESIS;  Surgeon: Barbarann Oneil BROCKS, MD;  Location:  SURGERY CENTER;  Service: Orthopedics;  Laterality: Left;   Social History   Occupational History    Comment: disabled  Tobacco Use   Smoking status: Every Day    Current packs/day: 0.25    Average packs/day: 0.3 packs/day for 0.6 years (0.1 ttl pk-yrs)    Types: Cigarettes    Start date: 09/10/1993    Last attempt to quit: 09/10/2022    Passive exposure: Never   Smokeless tobacco: Never   Tobacco comments:    1 cigarettes every day  06-02-2021  Vaping Use   Vaping status: Never Used  Substance and Sexual Activity   Alcohol use: Yes    Alcohol/week: 6.0 standard drinks of alcohol    Types: 6 Cans of beer per week   Drug use: Not Currently    Types: Marijuana    Comment: Uses daily   Sexual activity: Not Currently

## 2024-06-12 DIAGNOSIS — I639 Cerebral infarction, unspecified: Secondary | ICD-10-CM | POA: Diagnosis not present

## 2024-06-14 DIAGNOSIS — I639 Cerebral infarction, unspecified: Secondary | ICD-10-CM | POA: Diagnosis not present

## 2024-06-15 DIAGNOSIS — I639 Cerebral infarction, unspecified: Secondary | ICD-10-CM | POA: Diagnosis not present

## 2024-06-16 DIAGNOSIS — I639 Cerebral infarction, unspecified: Secondary | ICD-10-CM | POA: Diagnosis not present

## 2024-06-17 DIAGNOSIS — I639 Cerebral infarction, unspecified: Secondary | ICD-10-CM | POA: Diagnosis not present

## 2024-06-18 DIAGNOSIS — I639 Cerebral infarction, unspecified: Secondary | ICD-10-CM | POA: Diagnosis not present

## 2024-06-19 DIAGNOSIS — I639 Cerebral infarction, unspecified: Secondary | ICD-10-CM | POA: Diagnosis not present

## 2024-06-20 DIAGNOSIS — I639 Cerebral infarction, unspecified: Secondary | ICD-10-CM | POA: Diagnosis not present

## 2024-06-21 DIAGNOSIS — I639 Cerebral infarction, unspecified: Secondary | ICD-10-CM | POA: Diagnosis not present

## 2024-06-22 DIAGNOSIS — I639 Cerebral infarction, unspecified: Secondary | ICD-10-CM | POA: Diagnosis not present

## 2024-06-23 DIAGNOSIS — I639 Cerebral infarction, unspecified: Secondary | ICD-10-CM | POA: Diagnosis not present

## 2024-06-24 ENCOUNTER — Ambulatory Visit

## 2024-06-24 DIAGNOSIS — J309 Allergic rhinitis, unspecified: Secondary | ICD-10-CM

## 2024-06-24 DIAGNOSIS — I639 Cerebral infarction, unspecified: Secondary | ICD-10-CM | POA: Diagnosis not present

## 2024-06-25 DIAGNOSIS — I639 Cerebral infarction, unspecified: Secondary | ICD-10-CM | POA: Diagnosis not present

## 2024-06-26 ENCOUNTER — Telehealth: Payer: Self-pay | Admitting: Orthopedic Surgery

## 2024-06-26 DIAGNOSIS — Z9889 Other specified postprocedural states: Secondary | ICD-10-CM

## 2024-06-26 DIAGNOSIS — I639 Cerebral infarction, unspecified: Secondary | ICD-10-CM | POA: Diagnosis not present

## 2024-06-26 NOTE — Telephone Encounter (Signed)
 Referral placed.

## 2024-06-26 NOTE — Telephone Encounter (Signed)
 Pt states he need Rx for physical therapy at [facility] on [facility].

## 2024-07-04 ENCOUNTER — Ambulatory Visit

## 2024-07-04 DIAGNOSIS — J309 Allergic rhinitis, unspecified: Secondary | ICD-10-CM | POA: Diagnosis not present

## 2024-07-22 ENCOUNTER — Ambulatory Visit

## 2024-07-23 ENCOUNTER — Encounter (HOSPITAL_COMMUNITY): Payer: Self-pay | Admitting: Orthopedic Surgery

## 2024-07-25 ENCOUNTER — Other Ambulatory Visit: Payer: Self-pay

## 2024-07-25 ENCOUNTER — Ambulatory Visit (INDEPENDENT_AMBULATORY_CARE_PROVIDER_SITE_OTHER)

## 2024-07-25 ENCOUNTER — Ambulatory Visit: Attending: Orthopedic Surgery

## 2024-07-25 DIAGNOSIS — R293 Abnormal posture: Secondary | ICD-10-CM | POA: Insufficient documentation

## 2024-07-25 DIAGNOSIS — M25512 Pain in left shoulder: Secondary | ICD-10-CM | POA: Diagnosis present

## 2024-07-25 DIAGNOSIS — Z9889 Other specified postprocedural states: Secondary | ICD-10-CM | POA: Insufficient documentation

## 2024-07-25 DIAGNOSIS — M25611 Stiffness of right shoulder, not elsewhere classified: Secondary | ICD-10-CM | POA: Diagnosis present

## 2024-07-25 DIAGNOSIS — M25511 Pain in right shoulder: Secondary | ICD-10-CM | POA: Diagnosis present

## 2024-07-25 DIAGNOSIS — J302 Other seasonal allergic rhinitis: Secondary | ICD-10-CM | POA: Diagnosis not present

## 2024-07-25 DIAGNOSIS — G8929 Other chronic pain: Secondary | ICD-10-CM | POA: Diagnosis present

## 2024-07-25 NOTE — Therapy (Signed)
 " OUTPATIENT PHYSICAL THERAPY SHOULDER EVALUATION   Patient Name: Jason Stokes MRN: [id number] DOB:October 30, 1960, 64 y.o., male Today's Date: 07/25/2024  END OF SESSION:  PT End of Session - 07/25/24 0833     Visit Number 1    Number of Visits 16    Date for Recertification  09/19/24    Authorization Type [employer] MCD    PT Start Time 0833    PT Stop Time 0911    PT Time Calculation (min) 38 min    Activity Tolerance Patient limited by pain    Behavior During Therapy [facility] for tasks assessed/performed          Past Medical History:  Diagnosis Date   Anemia    Aneurysm of right internal iliac artery    a.) s/p embolization 08/19/2020: 29 mm RIGHT internal iliac artery aneurysm   Anxiety    Aortic atherosclerosis    Bilateral carpal tunnel syndrome 01/10/2018   Bipolar disorder (HCC)    Chorioretinal inflammation of both eyes    a.) on azothioprine   Chronic lower back pain    Coronary artery calcification seen on CT scan    a.) cCTA 04/21/2022: Ca score 20.6 (61st percentile for age/sex match control)   DDD (degenerative disc disease), cervical    Depression    Diastolic dysfunction    a.) TTE 01/13/2021: EF 60-65%, mod LVH, triv MR, G1DD   GERD (gastroesophageal reflux disease)    Hepatic steatosis    History of alcohol abuse    History of nuclear stress test    Myoview 10/16: EF 50%, diaphragmatic attenuation, no ischemia, low risk   Hypertension    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   a.) CT head 12/25/2012 --> RIGHT basal ganglia hypoattenuation related to remote lacunar infarct   Lipoma    Long term (current) use of anticoagulants    a.) apixaban    Long-term current use of immunomodulator    a.) on azothioprine for peripheral focal chorioretinal inflammation (both eyes)   Marijuana use    Migraine    Moderate persistent asthma with acute exacerbation 05/02/2018   PAF (paroxysmal atrial fibrillation) (HCC) 03/27/2015   a.) CHA2DS2VASc = 5 (HTN, CVA x2, vascular  disease history, T2DM);  b.) s/p ablation 10/02/2015; c.) s/p ablation 12/10/2015; d.) s/p DCCV (200 J x 1) 12/11/2015; e.) s/p ablation 04/28/2022; f.) rate/rhythm maintained on oral diltiazem  + carvedilol ; chronically anticoagulated with apixaban    Pilonidal cyst    Schizophrenia (HCC)    Sciatica neuralgia    Stroke (HCC)    T2DM (type 2 diabetes mellitus) (HCC)    Thoracic aortic ectasia 01/13/2021   a.) TTE 01/13/2021: Ao root 38 mm, asc Ao 39 mm   Past Surgical History:  Procedure Laterality Date   ATRIAL FIBRILLATION ABLATION  09/22/2015   ATRIAL FIBRILLATION ABLATION N/A 04/28/2022   Procedure: ATRIAL FIBRILLATION ABLATION;  Surgeon: Inocencio Soyla Lunger, MD;  Location: [facility];  Service: Cardiovascular;  Laterality: N/A;   CATARACT EXTRACTION Bilateral    CYST EXCISION  1996-97   surgery back of head    ELECTROPHYSIOLOGIC STUDY N/A 09/22/2015   Procedure: Atrial Fibrillation Ablation;  Surgeon: Will Lunger Inocencio, MD;  Location: [facility];  Service: Cardiovascular;  Laterality: N/A;   ELECTROPHYSIOLOGIC STUDY N/A 12/10/2015   Procedure: Atrial Fibrillation Ablation;  Surgeon: Will Lunger Inocencio, MD;  Location: [facility];  Service: Cardiovascular;  Laterality: N/A;   ELECTROPHYSIOLOGIC STUDY N/A 12/11/2015   Procedure: Cardioversion;  Surgeon: Will Gladis Norton, MD;  Location: [facility];  Service: Cardiovascular;  Laterality: N/A;   EMBOLIZATION (CATH LAB) Right 08/19/2020   Procedure: EMBOLIZATION;  Surgeon: Magda Debby SAILOR, MD;  Location: [facility];  Service: Cardiovascular;  Laterality: Right;  hypogastric   EXCISION MASS HEAD N/A 01/06/2017   Procedure: EXCISION MASS FOREHEAD;  Surgeon: Arelia Filippo, MD;  Location: [facility];  Service: Plastics;  Laterality: N/A;   EXCISION MASS UPPER EXTREMETIES Right 08/08/2022   Procedure: EXCISION MASS RIGHT FOREARM;  Surgeon: Murrell Drivers, MD;  Location: [facility];  Service: Orthopedics;  Laterality: Right;  45 MIN   GANGLION CYST EXCISION Left    INTERCOSTAL NERVE BLOCK  07/19/2003   KNEE ARTHROSCOPY Right 07/18/2014   MASS EXCISION N/A 01/25/2021   Procedure: EXCISION SUBCUTANEOUS VS SUBFASCIAL MASS TORSO 3CM;  Surgeon: Arelia Filippo, MD;  Location: [facility];  Service: Plastics;  Laterality: N/A;   OPEN SUBSCAPULARIS REPAIR Right 04/25/2024   Procedure: OPEN REPAIR RIGHT SHOULDER SUBSCAPULARIS;  Surgeon: Addie Cordella Hamilton, MD;  Location: [facility];  Service: Orthopedics;  Laterality: Right;   PILONIDAL CYST EXCISION N/A 09/13/2022   Procedure: CYST EXCISION PILONIDAL EXTENSIVE;  Surgeon: Desiderio Schanz, MD;  Location: [facility];  Service: General;  Laterality: N/A;   SHOULDER ARTHROSCOPY WITH ROTATOR CUFF REPAIR AND OPEN BICEPS TENODESIS Left 07/26/2023   Procedure: LEFT SHOULDER ARTHROSCOPY WITH OPEN ROTATOR CUFF REPAIR AND OPEN BICEPS TENODESIS;  Surgeon: Barbarann Oneil BROCKS, MD;  Location: [facility];  Service: Orthopedics;  Laterality: Left;   Patient Active Problem List   Diagnosis Date Noted   Degenerative superior labral anterior-to-posterior (SLAP) tear of right shoulder 05/11/2024   Synovitis of right shoulder 05/11/2024   Chronic anticoagulation 02/19/2024   Shoulder joint pain 02/19/2024   Tear of left rotator cuff 02/19/2024   Impingement syndrome of right shoulder 10/03/2023   Tendinitis of upper biceps tendon of left shoulder 06/12/2023   Complete tear of right rotator cuff 10/28/2022   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   Pilonidal cyst 09/13/2022   Aortic atherosclerosis 06/07/2022   Mass of right upper extremity 03/30/2022   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, moderate stage 03/28/2022   Peripheral focal chorioretinal inflammation of both eyes 03/28/2022   Pseudophakia of both eyes 03/28/2022   Retinal edema 03/28/2022   Hypercoagulable state due to  paroxysmal atrial fibrillation (HCC) 03/23/2021   Type 2 diabetes mellitus with hyperglycemia, without long-term current use of insulin  (HCC) 02/10/2021   Lumbar stenosis with neurogenic claudication 06/01/2020   Aneurysm of iliac artery 05/28/2020   Neck pain on left side 03/06/2019   Musculoskeletal chest pain 07/24/2018   Asthma, mild intermittent 05/02/2018   Allergic rhinitis caused by mold 05/02/2018   Tobacco use 05/02/2018   Primary osteoarthritis of both hands 01/26/2018   Bilateral carpal tunnel syndrome 01/10/2018   Stroke (HCC)    Schizophrenia (HCC)    Migraine    History of nuclear stress test    History of alcohol abuse    GERD (gastroesophageal reflux disease)    Dysrhythmia    Chronic lower back pain    Anxiety    Arthritis of knee, right 04/12/2017   Bipolar disorder (HCC) 04/03/2017   Lipoma of forehead 09/22/2016   Trigger ring finger of right hand 06/22/2016   Paroxysmal atrial fibrillation (HCC)    AF (atrial fibrillation) (HCC) 12/10/2015   Eczema 07/10/2015  History of CVA (cerebrovascular accident) 05/04/2015   Tear of medial meniscus of right knee 03/18/2015   Chronic pain of right knee 03/12/2015   Other and unspecified hyperlipidemia 11/25/2013   Nasal congestion 11/25/2013   Sinusitis, chronic 05/09/2013   Chronic low back pain 10/12/2012   Lumbar radiculopathy 10/12/2012   Hypertension associated with diabetes (HCC) 10/12/2012   Depression 10/12/2012   Insomnia 10/12/2012    PCP: Delbert Clam, MD   REFERRING PROVIDER:  Addie Cordella Hamilton, MD     REFERRING DIAG:  [phone] (ICD-10-CM) - S/P right rotator cuff repair  active assisted range of motion and passive range of motion of the right shoulder 2 times a week for 6 weeks.  Do not really want him to do any more external rotation than what he has now which is about 45 to 50 degrees.    THERAPY DIAG:  Chronic right shoulder pain  Abnormal posture  Stiffness of right shoulder, not  elsewhere classified  Rationale for Evaluation and Treatment: Rehabilitation  ONSET DATE: 04/25/2024 DOS  SUBJECTIVE:                                                                                                                                                                                      SUBJECTIVE STATEMENT: Patient present to PT following right shoulder arthroscopy with open subscap repair augmented with patch. He is currently 12 weeks post op. He states that had started on some mobility exercises and elbow exercises from previous PT for L shoulder. Patient interested in TPDN, as this helped with L shoulder.   Hand dominance: Right  PERTINENT HISTORY: Relevant OMHx includes R SLAP tear, chronic anticoagulation, R UE mass, glaucoma, type 2 DM, Lumbar stenosis with neurogenic claudication, BIL CTS, stroke, schizophrenia, anxiety, bipolar disorder, paroxysmal atrial fibrillation, depression, insomnia, anemia, cervical DDD, HTN  PAIN:  Are you having pain?  Yes: NPRS scale: 7/10 Pain location: where the screws are at Pain description: aching, throbbing, sharp Aggravating factors: movement  Relieving factors: muscle relaxers  PRECAUTIONS: None  RED FLAGS: None   WEIGHT BEARING RESTRICTIONS: No  FALLS:  Has patient fallen in last 6 months? Yes. Number of falls 1, while walking up the hill to his daughter's house. R knee gave out on you.   LIVING ENVIRONMENT: Lives with: lives with their spouse Lives in: House/apartment Stairs: No Has following equipment at home: Single point cane, Environmental Consultant - 2 wheeled, shower chair, and Grab bars  OCCUPATION: N/a  PLOF: Independent  PATIENT GOALS:to be able to move arm, sleep, and babysit his grandkids without significant pain   NEXT MD VISIT:   OBJECTIVE:  Note: Objective measures  were completed at Evaluation unless otherwise noted.  DIAGNOSTIC FINDINGS:  No post op imaging results available in epic; none included in  referral    PATIENT SURVEYS:  Quick Dash:  QUICK DASH  Please rate your ability do the following activities in the last week by selecting the number below the appropriate response.   Activities Rating  Open a tight or new jar.  4 = Severe difficulty  Do heavy household chores (e.g., wash walls, floors). 2 = Mild difficulty  Carry a shopping bag or briefcase 3 = Moderate difficulty  Wash your back. 2 = Mild difficulty  Use a knife to cut food. 4 = Severe difficulty  Recreational activities in which you take some force or impact through your arm, shoulder or hand (e.g., golf, hammering, tennis, etc.). 4 = Severe difficulty  During the past week, to what extent has your arm, shoulder or hand problem interfered with your normal social activities with family, friends, neighbors or groups?  4 = Quite a bit  During the past week, were you limited in your work or other regular daily activities as a result of your arm, shoulder or hand problem? 3 = Moderately limited  Rate the severity of the following symptoms in the last week: Arm, Shoulder, or hand pain. 5 = Extreme  Rate the severity of the following symptoms in the last week: Tingling (pins and needles) in your arm, shoulder or hand. 4 = Severe  During the past week, how much difficulty have you had sleeping because of the pain in your arm, shoulder or hand?  5 = So much difficulty that I can't sleep   (A QuickDASH score may not be calculated if there is greater than 1 missing item.)  Quick Dash Disability/Symptom Score:  65.9  Minimally Clinically Important Difference (MCID): 15-20 points  Flavio, F. et al. (2013). Minimally clinically important difference of the disabilities of the arm, shoulder, and hand outcome measures (DASH) and its shortened version (Quick DASH). Journal of Orthopaedic & Sports Physical Therapy, 44(1), 30-39)   COGNITION: Overall cognitive status: Within functional limits for tasks  assessed     SENSATION: Not tested  POSTURE: Rounded shoulders, forward head   UPPER EXTREMITY ROM:   Active ROM Right eval Left eval  Shoulder flexion 110   Shoulder extension    Shoulder abduction 70   Shoulder adduction    Shoulder internal rotation 60   Shoulder external rotation 56   Elbow flexion    Elbow extension    Wrist flexion    Wrist extension    Wrist ulnar deviation    Wrist radial deviation    Wrist pronation    Wrist supination    (Blank rows = not tested)  UPPER EXTREMITY MMT:  MMT Right eval Left eval  Shoulder flexion    Shoulder extension    Shoulder abduction    Shoulder adduction    Shoulder internal rotation    Shoulder external rotation    Middle trapezius    Lower trapezius    Elbow flexion    Elbow extension    Wrist flexion    Wrist extension    Wrist ulnar deviation    Wrist radial deviation    Wrist pronation    Wrist supination    Grip strength (lbs)    (Blank rows = not tested)  TREATMENT DATE:   [facility] Adult PT Treatment:                                                DATE: 07/25/2024   Initial evaluation: see patient education and home exercise program as noted below     PATIENT EDUCATION: Education details: reviewed initial home exercise program; discussion of POC, prognosis and goals for skilled PT   Person educated: Patient Education method: Explanation, Demonstration, and Handouts Education comprehension: verbalized understanding, returned demonstration, and needs further education  HOME EXERCISE PROGRAM: Access Code: [facility] URL: [email or web address] Date: 07/25/2024 Prepared by: Marko Molt  Exercises - Supine Shoulder Flexion Extension AAROM with Dowel  - 1 x daily - 7 x weekly - 2 sets - 10 reps - 5 sec hold - Supine Shoulder Horizontal Abduction Adduction  AAROM with Dowel  - 1 x daily - 7 x weekly - 2 sets - 10 reps - 5 sec hold - Standing Shoulder Abduction Slides at Wall  - 1 x daily - 7 x weekly - 2 sets - 10 reps - 5 sec hold - Standing shoulder flexion wall slides  - 1 x daily - 7 x weekly - 2 sets - 10 reps - 5 sec hold - Seated Scapular Retraction  - 1 x daily - 7 x weekly - 2 sets - 10 reps - 3 sec hold - Seated Cervical Retraction  - 1 x daily - 7 x weekly - 2 sets - 10 reps - 3 sec hold  ASSESSMENT:  CLINICAL IMPRESSION: Brenyn is a 64 y.o. male who was seen today for physical therapy evaluation and treatment for R shoulder pain with mobility deficits 3 months s/p R shoulder arthroscopy with open subscap repair augmented with patch . He is demonstrating decreased R shoulder AROM, decreased postural mm endurance, and has post op strength deficits. He has BIL UE numbness and tingling, with history of BIL CTS. He has related pain and difficulty with UE movement, lifting, reaching, ADLs/IADLs and has decreased sleep quality d/t pain. He requires skilled PT services at this time to address relevant deficits and improve overall function.     OBJECTIVE IMPAIRMENTS: decreased activity tolerance, decreased ROM, decreased strength, impaired UE functional use, improper body mechanics, postural dysfunction, and pain.   ACTIVITY LIMITATIONS: carrying, lifting, sleeping, bathing, dressing, reach over head, and hygiene/grooming  PARTICIPATION LIMITATIONS: meal prep, cleaning, laundry, interpersonal relationship, driving, shopping, and community activity  PERSONAL FACTORS: Past/current experiences, Time since onset of injury/illness/exacerbation, and 3+ comorbidities: Relevant OMHx includes R SLAP tear, chronic anticoagulation, R UE mass, glaucoma, type 2 DM, Lumbar stenosis with neurogenic claudication, BIL CTS, stroke, schizophrenia, anxiety, bipolar disorder, paroxysmal atrial fibrillation, depression, insomnia, anemia, cervical DDD, HTN are also  affecting patient's functional outcome.   REHAB POTENTIAL: Fair    CLINICAL DECISION MAKING: Evolving/moderate complexity  EVALUATION COMPLEXITY: Moderate   GOALS: Goals reviewed with patient? YES  SHORT TERM GOALS: Target date: 08/22/2024   Patient will be independent with initial home program at least 3 days/week.  Baseline: provided at eval Goal Status: INITIAL   2.  Patient will demonstrate improved postural awareness for at least 15 minutes while seated without need for cueing from PT.  Baseline: see objective measures Goal Status: INITIAL   3.  Patient will demonstrate at least 20 degree improvement of R shoulder flexion and  abduction AROM.  Baseline: see objective measures Goal status: INITIAL   LONG TERM GOALS: Target date: 09/19/2024    Patient will report improved overall functional ability with QuickDASH score of 20 or less.  Baseline: 65.9 Goal Status: INITIAL   2.  Patient will demonstrate at least 4+/5 MMT  Baseline: see objective measures  Goal status: INITIAL  3.  Patient will demonstrate ability to perform overhead lifting of at least 8# using appropriate body mechanics and with no more than minimal pain in order to safely perform normal daily tasks.   Baseline: unable   Goal Status: INITIAL   4.  Patient will report ability to sleep at least 6 hours/night with minimal pain-related sleep disturbance Baseline: unable to sleep d/t pain and hand numbness Goal status: INITIAL     PLAN:  PT FREQUENCY: 1-2x/week  PT DURATION: 8 weeks  PLANNED INTERVENTIONS: 97164- PT Re-evaluation, 97750- Physical Performance Testing, 97110-Therapeutic exercises, 97530- Therapeutic activity, [id number]- Neuromuscular re-education, 97535- Self Care, 02859- Manual therapy, G0283- Electrical stimulation (unattended), [phone]- Ionotophoresis 4mg /ml Dexamethasone , 79439 (1-2 muscles), 20561 (3+ muscles)- Dry Needling, Patient/Family education, Taping, Scar mobilization,  Cryotherapy, and Moist heat  For all possible CPT codes, reference the Planned Interventions line above.     Check all conditions that are expected to impact treatment: {Conditions expected to impact treatment:Diabetes mellitus, Psychological or psychiatric disorders, and Social determinants of health      PLAN FOR NEXT SESSION: focus on PROM and AAROM per surgeon's rx, modalities, update HEP as indiciated    Marko Molt, PT, DPT  07/25/2024 9:28 AM   "

## 2024-07-28 ENCOUNTER — Other Ambulatory Visit: Payer: Self-pay | Admitting: Dermatology

## 2024-07-28 DIAGNOSIS — L700 Acne vulgaris: Secondary | ICD-10-CM

## 2024-07-28 NOTE — Progress Notes (Unsigned)
" ° °  [address] [place] [address] Dept: [phone]  FOLLOW UP NOTE  Patient ID: Jason Stokes, male    DOB: 01/02/1961  Age: 64 y.o. MRN: [id number] Date of Office Visit: 07/29/2024  Assessment  Chief Complaint: No chief complaint on file.  HPI Jason Stokes is a 64 year old male who presents to the clinic for follow-up visit.  He was last seen in this clinic on 01/30/2024 by Dr. Kozlo for evaluation of asthma, allergic rhinitis on allergen immunotherapy, allergic conjunctivitis, and cannabis smoke exposure.  He began allergen immunotherapy directed toward Grass pollen, weed pollen, tree pollen, and dust mite on 02/09/2018.  Discussed the use of AI scribe software for clinical note transcription with the patient, who gave verbal consent to proceed.  History of Present Illness      Drug Allergies:  Allergies[1]  Physical Exam: There were no vitals taken for this visit.   Physical Exam  Diagnostics:    Assessment and Plan: No diagnosis found.  No orders of the defined types were placed in this encounter.   There are no Patient Instructions on file for this visit.  No follow-ups on file.    Thank you for the opportunity to care for this patient.  Please do not hesitate to contact me with questions.  Arlean Mutter, FNP [facility]          [1]  Allergies Allergen Reactions   Lisinopril Anaphylaxis    Swelling of lips and tongue.   Tylenol  [Acetaminophen ] Other (See Comments)    Acid reflux    Tramadol  Other (See Comments)    Nighmares   Lyrica  [Pregabalin ] Palpitations   "

## 2024-07-28 NOTE — Patient Instructions (Incomplete)
  1.  Continue to Treat and prevent inflammation of airway:   A.  Flonase  1 spray each nostril twice a day  B.  Azelastine  nasal spray 1 spray each nostril twice a day  C.  Montelukast  10 mg 1 tablet 1 time per day  D.  Breo 200 - 1 inhalation 1 time per day  E.  Immunotherapy  2. If needed:   A.  Cetirizine  10 mg 1 tablet 1-2 times a day  B.  Pataday  - 1 drop each eye 1 time per day  C.  Albuterol  2 puffs every 4-6 hours  3. Return to clinic in 6 months or earlier if problem  4. Influenza = Tamiflu. Covid = Paxlovid

## 2024-07-29 ENCOUNTER — Ambulatory Visit

## 2024-07-29 ENCOUNTER — Encounter: Payer: Self-pay | Admitting: Family Medicine

## 2024-07-29 ENCOUNTER — Other Ambulatory Visit: Payer: Self-pay | Admitting: Family Medicine

## 2024-07-29 ENCOUNTER — Ambulatory Visit: Admitting: Family Medicine

## 2024-07-29 ENCOUNTER — Other Ambulatory Visit: Payer: Self-pay

## 2024-07-29 VITALS — BP 120/84 | HR 85 | Temp 98.3°F

## 2024-07-29 DIAGNOSIS — R040 Epistaxis: Secondary | ICD-10-CM | POA: Insufficient documentation

## 2024-07-29 DIAGNOSIS — J3089 Other allergic rhinitis: Secondary | ICD-10-CM

## 2024-07-29 DIAGNOSIS — J302 Other seasonal allergic rhinitis: Secondary | ICD-10-CM | POA: Diagnosis not present

## 2024-07-29 DIAGNOSIS — J301 Allergic rhinitis due to pollen: Secondary | ICD-10-CM | POA: Diagnosis not present

## 2024-07-29 DIAGNOSIS — Z7729 Contact with and (suspected ) exposure to other hazardous substances: Secondary | ICD-10-CM

## 2024-07-29 DIAGNOSIS — J4550 Severe persistent asthma, uncomplicated: Secondary | ICD-10-CM | POA: Diagnosis not present

## 2024-07-29 MED ORDER — IPRATROPIUM BROMIDE 0.03 % NA SOLN: 2.0000 | Freq: Three times a day (TID) | NASAL | 5 refills | Status: AC

## 2024-07-29 MED ORDER — CROMOLYN SODIUM 4 % OP SOLN: 2.0000 [drp] | Freq: Four times a day (QID) | OPHTHALMIC | 5 refills | Status: AC | PRN

## 2024-07-29 MED ORDER — CETIRIZINE HCL 10 MG PO TABS: 10.0000 mg | ORAL_TABLET | Freq: Every day | ORAL | 1 refills | Status: AC | PRN

## 2024-07-29 MED ORDER — ALBUTEROL SULFATE HFA 108 (90 BASE) MCG/ACT IN AERS: 2.0000 | INHALATION_SPRAY | RESPIRATORY_TRACT | 1 refills | Status: AC | PRN

## 2024-07-29 MED ORDER — FLUTICASONE PROPIONATE 50 MCG/ACT NA SUSP: 1.0000 | Freq: Two times a day (BID) | NASAL | 1 refills | Status: AC

## 2024-07-29 MED ORDER — MONTELUKAST SODIUM 10 MG PO TABS: 10.0000 mg | ORAL_TABLET | Freq: Every day | ORAL | 1 refills | Status: AC

## 2024-07-29 MED ORDER — FLUTICASONE FUROATE-VILANTEROL 200-25 MCG/ACT IN AEPB: 1.0000 | INHALATION_SPRAY | Freq: Every morning | RESPIRATORY_TRACT | 1 refills | Status: DC

## 2024-07-30 NOTE — Telephone Encounter (Signed)
 Per Arlean, we are changing to Advair Diskus.

## 2024-07-30 NOTE — Telephone Encounter (Signed)
 Can you please try for wixrla, bryna, dulera , adviar HFA or advair diskus or Symbicort? Thank you

## 2024-07-30 NOTE — Telephone Encounter (Signed)
 Advair diskus 250-1 puff once a day please. Thank you for researching this info!!!

## 2024-08-02 ENCOUNTER — Ambulatory Visit: Admitting: Physical Therapy

## 2024-08-02 ENCOUNTER — Telehealth: Payer: Self-pay | Admitting: Family Medicine

## 2024-08-02 ENCOUNTER — Encounter: Payer: Self-pay | Admitting: Physical Therapy

## 2024-08-02 DIAGNOSIS — M25511 Pain in right shoulder: Secondary | ICD-10-CM | POA: Diagnosis not present

## 2024-08-02 DIAGNOSIS — G8929 Other chronic pain: Secondary | ICD-10-CM

## 2024-08-02 DIAGNOSIS — R293 Abnormal posture: Secondary | ICD-10-CM

## 2024-08-02 DIAGNOSIS — M25611 Stiffness of right shoulder, not elsewhere classified: Secondary | ICD-10-CM

## 2024-08-02 DIAGNOSIS — M25512 Pain in left shoulder: Secondary | ICD-10-CM

## 2024-08-02 NOTE — Therapy (Signed)
 " OUTPATIENT PHYSICAL THERAPY SHOULDER EVALUATION   Patient Name: Jason Stokes MRN: [id number] DOB:1961-05-17, 64 y.o., male Today's Date: 08/02/2024  END OF SESSION:  PT End of Session - 08/02/24 1017     Visit Number 2    Number of Visits 16    Date for Recertification  09/19/24    Authorization Type [employer] MCD    PT Start Time 1016    PT Stop Time 1057    PT Time Calculation (min) 41 min    Activity Tolerance Patient limited by pain    Behavior During Therapy [facility] for tasks assessed/performed          Past Medical History:  Diagnosis Date   Anemia    Aneurysm of right internal iliac artery    a.) s/p embolization 08/19/2020: 29 mm RIGHT internal iliac artery aneurysm   Anxiety    Aortic atherosclerosis    Bilateral carpal tunnel syndrome 01/10/2018   Bipolar disorder (HCC)    Chorioretinal inflammation of both eyes    a.) on azothioprine   Chronic lower back pain    Coronary artery calcification seen on CT scan    a.) cCTA 04/21/2022: Ca score 20.6 (61st percentile for age/sex match control)   DDD (degenerative disc disease), cervical    Depression    Diastolic dysfunction    a.) TTE 01/13/2021: EF 60-65%, mod LVH, triv MR, G1DD   GERD (gastroesophageal reflux disease)    Hepatic steatosis    History of alcohol abuse    History of nuclear stress test    Myoview 10/16: EF 50%, diaphragmatic attenuation, no ischemia, low risk   Hypertension    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   a.) CT head 12/25/2012 --> RIGHT basal ganglia hypoattenuation related to remote lacunar infarct   Lipoma    Long term (current) use of anticoagulants    a.) apixaban    Long-term current use of immunomodulator    a.) on azothioprine for peripheral focal chorioretinal inflammation (both eyes)   Marijuana use    Migraine    Moderate persistent asthma with acute exacerbation 05/02/2018   PAF (paroxysmal atrial fibrillation) (HCC) 03/27/2015   a.) CHA2DS2VASc = 5 (HTN, CVA x2, vascular  disease history, T2DM);  b.) s/p ablation 10/02/2015; c.) s/p ablation 12/10/2015; d.) s/p DCCV (200 J x 1) 12/11/2015; e.) s/p ablation 04/28/2022; f.) rate/rhythm maintained on oral diltiazem  + carvedilol ; chronically anticoagulated with apixaban    Pilonidal cyst    Schizophrenia (HCC)    Sciatica neuralgia    Stroke (HCC)    T2DM (type 2 diabetes mellitus) (HCC)    Thoracic aortic ectasia 01/13/2021   a.) TTE 01/13/2021: Ao root 38 mm, asc Ao 39 mm   Past Surgical History:  Procedure Laterality Date   ATRIAL FIBRILLATION ABLATION  09/22/2015   ATRIAL FIBRILLATION ABLATION N/A 04/28/2022   Procedure: ATRIAL FIBRILLATION ABLATION;  Surgeon: Inocencio Soyla Lunger, MD;  Location: [facility];  Service: Cardiovascular;  Laterality: N/A;   CATARACT EXTRACTION Bilateral    CYST EXCISION  1996-97   surgery back of head    ELECTROPHYSIOLOGIC STUDY N/A 09/22/2015   Procedure: Atrial Fibrillation Ablation;  Surgeon: Will Lunger Inocencio, MD;  Location: [facility];  Service: Cardiovascular;  Laterality: N/A;   ELECTROPHYSIOLOGIC STUDY N/A 12/10/2015   Procedure: Atrial Fibrillation Ablation;  Surgeon: Will Lunger Inocencio, MD;  Location: [facility];  Service: Cardiovascular;  Laterality: N/A;   ELECTROPHYSIOLOGIC STUDY N/A 12/11/2015   Procedure: Cardioversion;  Surgeon: Will Gladis Norton, MD;  Location: [facility];  Service: Cardiovascular;  Laterality: N/A;   EMBOLIZATION (CATH LAB) Right 08/19/2020   Procedure: EMBOLIZATION;  Surgeon: Magda Debby SAILOR, MD;  Location: [facility];  Service: Cardiovascular;  Laterality: Right;  hypogastric   EXCISION MASS HEAD N/A 01/06/2017   Procedure: EXCISION MASS FOREHEAD;  Surgeon: Arelia Filippo, MD;  Location: [facility];  Service: Plastics;  Laterality: N/A;   EXCISION MASS UPPER EXTREMETIES Right 08/08/2022   Procedure: EXCISION MASS RIGHT FOREARM;  Surgeon: Murrell Drivers, MD;  Location: [facility];  Service: Orthopedics;  Laterality: Right;  45 MIN   GANGLION CYST EXCISION Left    INTERCOSTAL NERVE BLOCK  07/19/2003   KNEE ARTHROSCOPY Right 07/18/2014   MASS EXCISION N/A 01/25/2021   Procedure: EXCISION SUBCUTANEOUS VS SUBFASCIAL MASS TORSO 3CM;  Surgeon: Arelia Filippo, MD;  Location: [facility];  Service: Plastics;  Laterality: N/A;   OPEN SUBSCAPULARIS REPAIR Right 04/25/2024   Procedure: OPEN REPAIR RIGHT SHOULDER SUBSCAPULARIS;  Surgeon: Addie Cordella Hamilton, MD;  Location: [facility];  Service: Orthopedics;  Laterality: Right;   PILONIDAL CYST EXCISION N/A 09/13/2022   Procedure: CYST EXCISION PILONIDAL EXTENSIVE;  Surgeon: Desiderio Schanz, MD;  Location: [facility];  Service: General;  Laterality: N/A;   SHOULDER ARTHROSCOPY WITH ROTATOR CUFF REPAIR AND OPEN BICEPS TENODESIS Left 07/26/2023   Procedure: LEFT SHOULDER ARTHROSCOPY WITH OPEN ROTATOR CUFF REPAIR AND OPEN BICEPS TENODESIS;  Surgeon: Barbarann Oneil BROCKS, MD;  Location: [facility];  Service: Orthopedics;  Laterality: Left;   Patient Active Problem List   Diagnosis Date Noted   Epistaxis 07/29/2024   Seasonal allergic rhinitis due to pollen 07/29/2024   Asthma, severe persistent, well-controlled (HCC) 07/29/2024   Degenerative superior labral anterior-to-posterior (SLAP) tear of right shoulder 05/11/2024   Synovitis of right shoulder 05/11/2024   Chronic anticoagulation 02/19/2024   Shoulder joint pain 02/19/2024   Tear of left rotator cuff 02/19/2024   Impingement syndrome of right shoulder 10/03/2023   Tendinitis of upper biceps tendon of left shoulder 06/12/2023   Complete tear of right rotator cuff 10/28/2022   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   Pilonidal cyst 09/13/2022   Aortic atherosclerosis 06/07/2022   Mass of right upper extremity 03/30/2022   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, moderate stage 03/28/2022   Peripheral focal chorioretinal  inflammation of both eyes 03/28/2022   Pseudophakia of both eyes 03/28/2022   Retinal edema 03/28/2022   Hypercoagulable state due to paroxysmal atrial fibrillation (HCC) 03/23/2021   Type 2 diabetes mellitus with hyperglycemia, without long-term current use of insulin  (HCC) 02/10/2021   Lumbar stenosis with neurogenic claudication 06/01/2020   Aneurysm of iliac artery 05/28/2020   Neck pain on left side 03/06/2019   Musculoskeletal chest pain 07/24/2018   Asthma, mild intermittent 05/02/2018   Seasonal and perennial allergic rhinitis 05/02/2018   Tobacco use 05/02/2018   Primary osteoarthritis of both hands 01/26/2018   Bilateral carpal tunnel syndrome 01/10/2018   Stroke (HCC)    Schizophrenia (HCC)    Migraine    History of nuclear stress test    History of alcohol abuse    GERD (gastroesophageal reflux disease)    Dysrhythmia    Chronic lower back pain    Anxiety    Arthritis of knee, right 04/12/2017   Bipolar disorder (HCC) 04/03/2017   Lipoma of forehead 09/22/2016   Trigger ring finger of  right hand 06/22/2016   Paroxysmal atrial fibrillation (HCC)    AF (atrial fibrillation) (HCC) 12/10/2015   Eczema 07/10/2015   History of CVA (cerebrovascular accident) 05/04/2015   Tear of medial meniscus of right knee 03/18/2015   Chronic pain of right knee 03/12/2015   Other and unspecified hyperlipidemia 11/25/2013   Nasal congestion 11/25/2013   Sinusitis, chronic 05/09/2013   Chronic low back pain 10/12/2012   Lumbar radiculopathy 10/12/2012   Hypertension associated with diabetes (HCC) 10/12/2012   Depression 10/12/2012   Insomnia 10/12/2012    PCP: Delbert Clam, MD   REFERRING PROVIDER:  Addie Cordella Hamilton, MD     REFERRING DIAG:  [phone] (ICD-10-CM) - S/P right rotator cuff repair  active assisted range of motion and passive range of motion of the right shoulder 2 times a week for 6 weeks.  Do not really want him to do any more external rotation than what he  has now which is about 45 to 50 degrees.    THERAPY DIAG:  Chronic right shoulder pain  Abnormal posture  Stiffness of right shoulder, not elsewhere classified  Acute pain of left shoulder  Rationale for Evaluation and Treatment: Rehabilitation  ONSET DATE: 04/25/2024 DOS  SUBJECTIVE:                                                                                                                                                                                      SUBJECTIVE STATEMENT: Pt reports high levels of pain - rates 9/10  Hand dominance: Right  PERTINENT HISTORY: Relevant OMHx includes R SLAP tear, chronic anticoagulation, R UE mass, glaucoma, type 2 DM, Lumbar stenosis with neurogenic claudication, BIL CTS, stroke, schizophrenia, anxiety, bipolar disorder, paroxysmal atrial fibrillation, depression, insomnia, anemia, cervical DDD, HTN  PAIN:  Are you having pain?  Yes: NPRS scale: 7/10 Pain location: where the screws are at Pain description: aching, throbbing, sharp Aggravating factors: movement  Relieving factors: muscle relaxers  PRECAUTIONS: None  RED FLAGS: None   WEIGHT BEARING RESTRICTIONS: No  FALLS:  Has patient fallen in last 6 months? Yes. Number of falls 1, while walking up the hill to his daughter's house. R knee gave out on you.   LIVING ENVIRONMENT: Lives with: lives with their spouse Lives in: House/apartment Stairs: No Has following equipment at home: Single point cane, Environmental Consultant - 2 wheeled, shower chair, and Grab bars  OCCUPATION: N/a  PLOF: Independent  PATIENT GOALS:to be able to move arm, sleep, and babysit his grandkids without significant pain   NEXT MD VISIT:   OBJECTIVE:  Note: Objective measures were completed at Evaluation unless otherwise noted.  DIAGNOSTIC FINDINGS:  No post  op imaging results available in epic; none included in referral    PATIENT SURVEYS:  Quick Dash:  QUICK DASH  Please rate your ability do the  following activities in the last week by selecting the number below the appropriate response.   Activities Rating  Open a tight or new jar.  4 = Severe difficulty  Do heavy household chores (e.g., wash walls, floors). 2 = Mild difficulty  Carry a shopping bag or briefcase 3 = Moderate difficulty  Wash your back. 2 = Mild difficulty  Use a knife to cut food. 4 = Severe difficulty  Recreational activities in which you take some force or impact through your arm, shoulder or hand (e.g., golf, hammering, tennis, etc.). 4 = Severe difficulty  During the past week, to what extent has your arm, shoulder or hand problem interfered with your normal social activities with family, friends, neighbors or groups?  4 = Quite a bit  During the past week, were you limited in your work or other regular daily activities as a result of your arm, shoulder or hand problem? 3 = Moderately limited  Rate the severity of the following symptoms in the last week: Arm, Shoulder, or hand pain. 5 = Extreme  Rate the severity of the following symptoms in the last week: Tingling (pins and needles) in your arm, shoulder or hand. 4 = Severe  During the past week, how much difficulty have you had sleeping because of the pain in your arm, shoulder or hand?  5 = So much difficulty that I can't sleep   (A QuickDASH score may not be calculated if there is greater than 1 missing item.)  Quick Dash Disability/Symptom Score:  65.9  Minimally Clinically Important Difference (MCID): 15-20 points  Flavio, F. et al. (2013). Minimally clinically important difference of the disabilities of the arm, shoulder, and hand outcome measures (DASH) and its shortened version (Quick DASH). Journal of Orthopaedic & Sports Physical Therapy, 44(1), 30-39)   COGNITION: Overall cognitive status: Within functional limits for tasks assessed     SENSATION: Not tested  POSTURE: Rounded shoulders, forward head   UPPER EXTREMITY ROM:   Active  ROM Right eval Left eval  Shoulder flexion 110   Shoulder extension    Shoulder abduction 70   Shoulder adduction    Shoulder internal rotation 60   Shoulder external rotation 56   Elbow flexion    Elbow extension    Wrist flexion    Wrist extension    Wrist ulnar deviation    Wrist radial deviation    Wrist pronation    Wrist supination    (Blank rows = not tested)  UPPER EXTREMITY MMT:  MMT Right eval Left eval  Shoulder flexion    Shoulder extension    Shoulder abduction    Shoulder adduction    Shoulder internal rotation    Shoulder external rotation    Middle trapezius    Lower trapezius    Elbow flexion    Elbow extension    Wrist flexion    Wrist extension    Wrist ulnar deviation    Wrist radial deviation    Wrist pronation    Wrist supination    Grip strength (lbs)    (Blank rows = not tested)  TREATMENT DATE:   [facility] Adult PT Treatment  08/03/2023:  Therapeutic Exercise: PROM flexion and scaption Chest press with dowel Supine flexion ROM - non-painful arc - 2x10 Pain free isometrics IR/ER - emphasis on pain free like you're picking up an empty coffee cup Pulley - 2' flexion Row - RTB - 2x8x5s    HOME EXERCISE PROGRAM: Access Code: [email or web address] URL: [email or web address] Date: 08/02/2024 Prepared by: Helene Gasmen  Exercises - Supine Shoulder Flexion Extension AAROM with Dowel  - 1 x daily - 7 x weekly - 2 sets - 10 reps - 5 sec hold - Standing Shoulder Abduction Slides at Wall  - 1 x daily - 7 x weekly - 2 sets - 10 reps - 5 sec hold - Standing shoulder flexion wall slides  - 1 x daily - 7 x weekly - 2 sets - 10 reps - 5 sec hold - Seated Scapular Retraction  - 1 x daily - 7 x weekly - 2 sets - 10 reps - 3 sec hold - Seated Cervical Retraction  - 1 x daily - 7 x weekly - 2 sets - 10 reps - 3 sec  hold - Standing Isometric Shoulder Internal Rotation at Doorway  - 1 x daily - 7 x weekly - 2 sets - 5 reps - 15 sec hold - Standing Isometric Shoulder External Rotation with Doorway  - 3 x daily - 7 x weekly - 2 sets - 5 reps - 15 sec hold  ASSESSMENT:  CLINICAL IMPRESSION:   08/02/2024:  Alexa tolerated session well with no adverse reaction.  Discussed the importance of avoiding stretching into ER and avoiding pain.  Introduced pain free isometrics with heavy emphasis on avoiding any pain beyond a 1/10.  Updated HEP.  EVAL: Huey is a 64 y.o. male who was seen today for physical therapy evaluation and treatment for R shoulder pain with mobility deficits 3 months s/p R shoulder arthroscopy with open subscap repair augmented with patch . He is demonstrating decreased R shoulder AROM, decreased postural mm endurance, and has post op strength deficits. He has BIL UE numbness and tingling, with history of BIL CTS. He has related pain and difficulty with UE movement, lifting, reaching, ADLs/IADLs and has decreased sleep quality d/t pain. He requires skilled PT services at this time to address relevant deficits and improve overall function.     OBJECTIVE IMPAIRMENTS: decreased activity tolerance, decreased ROM, decreased strength, impaired UE functional use, improper body mechanics, postural dysfunction, and pain.   ACTIVITY LIMITATIONS: carrying, lifting, sleeping, bathing, dressing, reach over head, and hygiene/grooming  PARTICIPATION LIMITATIONS: meal prep, cleaning, laundry, interpersonal relationship, driving, shopping, and community activity  PERSONAL FACTORS: Past/current experiences, Time since onset of injury/illness/exacerbation, and 3+ comorbidities: Relevant OMHx includes R SLAP tear, chronic anticoagulation, R UE mass, glaucoma, type 2 DM, Lumbar stenosis with neurogenic claudication, BIL CTS, stroke, schizophrenia, anxiety, bipolar disorder, paroxysmal atrial fibrillation, depression,  insomnia, anemia, cervical DDD, HTN are also affecting patient's functional outcome.   REHAB POTENTIAL: Fair    CLINICAL DECISION MAKING: Evolving/moderate complexity  EVALUATION COMPLEXITY: Moderate   GOALS: Goals reviewed with patient? YES  SHORT TERM GOALS: Target date: 08/22/2024   Patient will be independent with initial home program at least 3 days/week.  Baseline: provided at eval Goal Status: INITIAL   2.  Patient will demonstrate improved postural awareness for at least 15 minutes while seated without need for cueing from PT.  Baseline: see objective measures Goal Status: INITIAL   3.  Patient will demonstrate at least 20 degree improvement of R shoulder flexion and abduction AROM.  Baseline: see objective measures Goal status: INITIAL   LONG TERM GOALS: Target date: 09/19/2024    Patient will report improved overall functional ability with QuickDASH score of 20 or less.  Baseline: 65.9 Goal Status: INITIAL   2.  Patient will demonstrate at least 4+/5 MMT  Baseline: see objective measures  Goal status: INITIAL  3.  Patient will demonstrate ability to perform overhead lifting of at least 8# using appropriate body mechanics and with no more than minimal pain in order to safely perform normal daily tasks.   Baseline: unable   Goal Status: INITIAL   4.  Patient will report ability to sleep at least 6 hours/night with minimal pain-related sleep disturbance Baseline: unable to sleep d/t pain and hand numbness Goal status: INITIAL     PLAN:  PT FREQUENCY: 1-2x/week  PT DURATION: 8 weeks  PLANNED INTERVENTIONS: 97164- PT Re-evaluation, 97750- Physical Performance Testing, 97110-Therapeutic exercises, 97530- Therapeutic activity, 97112- Neuromuscular re-education, 97535- Self Care, 02859- Manual therapy, [id number]- Electrical stimulation (unattended), [phone]- Ionotophoresis 4mg /ml Dexamethasone , 79439 (1-2 muscles), 20561 (3+ muscles)- Dry Needling, Patient/Family  education, Taping, Scar mobilization, Cryotherapy, and Moist heat  For all possible CPT codes, reference the Planned Interventions line above.     Check all conditions that are expected to impact treatment: {Conditions expected to impact treatment:Diabetes mellitus, Psychological or psychiatric disorders, and Social determinants of health      PLAN FOR NEXT SESSION: focus on PROM and AAROM per surgeon's rx, modalities, update HEP as indiciated    Helene BRAVO Lakoda Mcanany PT 08/02/2024 11:03 AM   "

## 2024-08-02 NOTE — Telephone Encounter (Signed)
 Contacted pt to confirmed appt

## 2024-08-05 ENCOUNTER — Ambulatory Visit: Attending: Family Medicine | Admitting: Family Medicine

## 2024-08-05 ENCOUNTER — Encounter: Payer: Self-pay | Admitting: Family Medicine

## 2024-08-05 VITALS — BP 149/81 | HR 79 | Temp 98.3°F | Ht 73.0 in | Wt 237.4 lb

## 2024-08-05 DIAGNOSIS — I480 Paroxysmal atrial fibrillation: Secondary | ICD-10-CM | POA: Diagnosis not present

## 2024-08-05 DIAGNOSIS — Z23 Encounter for immunization: Secondary | ICD-10-CM | POA: Diagnosis not present

## 2024-08-05 DIAGNOSIS — M5116 Intervertebral disc disorders with radiculopathy, lumbar region: Secondary | ICD-10-CM

## 2024-08-05 DIAGNOSIS — E1169 Type 2 diabetes mellitus with other specified complication: Secondary | ICD-10-CM

## 2024-08-05 DIAGNOSIS — J4520 Mild intermittent asthma, uncomplicated: Secondary | ICD-10-CM

## 2024-08-05 DIAGNOSIS — J3089 Other allergic rhinitis: Secondary | ICD-10-CM

## 2024-08-05 DIAGNOSIS — R600 Localized edema: Secondary | ICD-10-CM | POA: Diagnosis not present

## 2024-08-05 DIAGNOSIS — I152 Hypertension secondary to endocrine disorders: Secondary | ICD-10-CM

## 2024-08-05 DIAGNOSIS — M5416 Radiculopathy, lumbar region: Secondary | ICD-10-CM

## 2024-08-05 DIAGNOSIS — I10 Essential (primary) hypertension: Secondary | ICD-10-CM | POA: Diagnosis not present

## 2024-08-05 DIAGNOSIS — E1159 Type 2 diabetes mellitus with other circulatory complications: Secondary | ICD-10-CM

## 2024-08-05 DIAGNOSIS — M542 Cervicalgia: Secondary | ICD-10-CM

## 2024-08-05 LAB — POCT GLYCOSYLATED HEMOGLOBIN (HGB A1C): HbA1c, POC (controlled diabetic range): 5.7 % (ref 0.0–7.0)

## 2024-08-05 MED ORDER — VALSARTAN 80 MG PO TABS: 80.0000 mg | ORAL_TABLET | Freq: Every day | ORAL | 1 refills | Status: AC

## 2024-08-05 MED ORDER — METOPROLOL SUCCINATE ER 25 MG PO TB24: 25.0000 mg | ORAL_TABLET | Freq: Every day | ORAL | 1 refills | Status: AC

## 2024-08-05 MED ORDER — FUROSEMIDE 20 MG PO TABS: 20.0000 mg | ORAL_TABLET | Freq: Every day | ORAL | 1 refills | Status: AC

## 2024-08-05 MED ORDER — METHOCARBAMOL 500 MG PO TABS: 500.0000 mg | ORAL_TABLET | Freq: Three times a day (TID) | ORAL | 3 refills | Status: AC | PRN

## 2024-08-05 NOTE — Progress Notes (Signed)
 "  Subjective:  Patient ID: Jason Stokes, male    DOB: July 14, 1961  Age: 64 y.o. MRN: [id number]  CC: Medical Management of Chronic Issues (Back pain)     Discussed the use of AI scribe software for clinical note transcription with the patient, who gave verbal consent to proceed.  History of Present Illness Jason Stokes is a 64 year old male with a history of  hypertension, type 2 diabetes mellitus (diet-controlled), bipolar disorder, paroxysmal A. 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  He has had 2 weeks of worsening right-sided back pain described as nerve pain. The pain is persistent and now requires a cane for ambulation. He has chronic back problems with similar right-sided pain. A steroid injection last year gave partial relief. He has not had recent injections and prefers to avoid further steroids or surgery.  He takes methocarbamol  for pain. He stopped duloxetine  because it caused heart palpitations. He previously used oxycodone  after surgery but is not using opioids now. He uses cannabis for pain and occasionally smokes cigarettes when drinking alcohol. In the past I had referred him to pain management however he was discharged from the clinic because he would not quit using cannabis.  He has high blood pressure and BP is elevated today.  He takes Eliquis  for his A-fib and is managed by electrophysiology. He has asthma and uses a nebulizer as needed. He receives allergy shots every 2 weeks.  He has swelling of the left leg which resolves when he goes to bed.  He has diabetes with a recent A1c of 5.7, indicating good control.  His diabetes is diet controlled.    Past Medical History:  Diagnosis Date   Anemia    Aneurysm of right internal iliac artery    a.) s/p embolization  08/19/2020: 29 mm RIGHT internal iliac artery aneurysm   Anxiety    Aortic atherosclerosis    Bilateral carpal tunnel syndrome 01/10/2018   Bipolar disorder (HCC)    Chorioretinal inflammation of both eyes    a.) on azothioprine   Chronic lower back pain    Coronary artery calcification seen on CT scan    a.) cCTA 04/21/2022: Ca score 20.6 (61st percentile for age/sex match control)   DDD (degenerative disc disease), cervical    Depression    Diastolic dysfunction    a.) TTE 01/13/2021: EF 60-65%, mod LVH, triv MR, G1DD   GERD (gastroesophageal reflux disease)    Hepatic steatosis    History of alcohol abuse    History of nuclear stress test    Myoview 10/16: EF 50%, diaphragmatic attenuation, no ischemia, low risk   Hypertension    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   a.) CT head 12/25/2012 --> RIGHT basal ganglia hypoattenuation related to remote lacunar infarct   Lipoma    Long term (current) use of anticoagulants    a.) apixaban    Long-term current use of immunomodulator    a.) on azothioprine for peripheral focal chorioretinal inflammation (both eyes)   Marijuana use    Migraine    Moderate persistent asthma with acute exacerbation 05/02/2018   PAF (paroxysmal atrial fibrillation) (HCC) 03/27/2015   a.) CHA2DS2VASc = 5 (HTN, CVA x2, vascular disease history, T2DM);  b.) s/p ablation 10/02/2015; c.) s/p ablation 12/10/2015; d.) s/p DCCV (200 J x 1) 12/11/2015; e.) s/p ablation  04/28/2022; f.) rate/rhythm maintained on oral diltiazem  + carvedilol ; chronically anticoagulated with apixaban    Pilonidal cyst    Schizophrenia (HCC)    Sciatica neuralgia    Stroke (HCC)    T2DM (type 2 diabetes mellitus) (HCC)    Thoracic aortic ectasia 01/13/2021   a.) TTE 01/13/2021: Ao root 38 mm, asc Ao 39 mm    Past Surgical History:  Procedure Laterality Date   ATRIAL FIBRILLATION ABLATION  09/22/2015   ATRIAL FIBRILLATION ABLATION N/A 04/28/2022   Procedure: ATRIAL FIBRILLATION  ABLATION;  Surgeon: Inocencio Soyla Lunger, MD;  Location: [facility];  Service: Cardiovascular;  Laterality: N/A;   CATARACT EXTRACTION Bilateral    CYST EXCISION  1996-97   surgery back of head    ELECTROPHYSIOLOGIC STUDY N/A 09/22/2015   Procedure: Atrial Fibrillation Ablation;  Surgeon: Will Lunger Inocencio, MD;  Location: [facility];  Service: Cardiovascular;  Laterality: N/A;   ELECTROPHYSIOLOGIC STUDY N/A 12/10/2015   Procedure: Atrial Fibrillation Ablation;  Surgeon: Will Lunger Inocencio, MD;  Location: [facility];  Service: Cardiovascular;  Laterality: N/A;   ELECTROPHYSIOLOGIC STUDY N/A 12/11/2015   Procedure: Cardioversion;  Surgeon: Will Lunger Inocencio, MD;  Location: [facility];  Service: Cardiovascular;  Laterality: N/A;   EMBOLIZATION (CATH LAB) Right 08/19/2020   Procedure: EMBOLIZATION;  Surgeon: Magda Debby SAILOR, MD;  Location: [facility];  Service: Cardiovascular;  Laterality: Right;  hypogastric   EXCISION MASS HEAD N/A 01/06/2017   Procedure: EXCISION MASS FOREHEAD;  Surgeon: Arelia Filippo, MD;  Location: [facility];  Service: Plastics;  Laterality: N/A;   EXCISION MASS UPPER EXTREMETIES Right 08/08/2022   Procedure: EXCISION MASS RIGHT FOREARM;  Surgeon: Murrell Drivers, MD;  Location: [facility];  Service: Orthopedics;  Laterality: Right;  45 MIN   GANGLION CYST EXCISION Left    INTERCOSTAL NERVE BLOCK  07/19/2003   KNEE ARTHROSCOPY Right 07/18/2014   MASS EXCISION N/A 01/25/2021   Procedure: EXCISION SUBCUTANEOUS VS SUBFASCIAL MASS TORSO 3CM;  Surgeon: Arelia Filippo, MD;  Location: [facility];  Service: Plastics;  Laterality: N/A;   OPEN SUBSCAPULARIS REPAIR Right 04/25/2024   Procedure: OPEN REPAIR RIGHT SHOULDER SUBSCAPULARIS;  Surgeon: Addie Cordella Hamilton, MD;  Location: [facility];  Service: Orthopedics;  Laterality: Right;   PILONIDAL CYST EXCISION N/A 09/13/2022   Procedure: CYST  EXCISION PILONIDAL EXTENSIVE;  Surgeon: Desiderio Schanz, MD;  Location: [facility];  Service: General;  Laterality: N/A;   SHOULDER ARTHROSCOPY WITH ROTATOR CUFF REPAIR AND OPEN BICEPS TENODESIS Left 07/26/2023   Procedure: LEFT SHOULDER ARTHROSCOPY WITH OPEN ROTATOR CUFF REPAIR AND OPEN BICEPS TENODESIS;  Surgeon: Barbarann Oneil BROCKS, MD;  Location:  SURGERY CENTER;  Service: Orthopedics;  Laterality: Left;    Family History  Problem Relation Age of Onset   Heart disease Father    Schizophrenia Sister     Social History   Socioeconomic History   Marital status: Single    Spouse name: Orlean   Number of children: 3   Years of education: HS   Highest education level: 11th grade  Occupational History    Comment: disabled  Tobacco Use   Smoking status: Every Day    Current packs/day: 0.25    Average packs/day: 0.3 packs/day for 0.7 years (0.2 ttl pk-yrs)    Types: Cigarettes    Start date: 09/10/1993    Last attempt to quit: 09/10/2022    Passive exposure: Never   Smokeless tobacco:  Never   Tobacco comments:    1 cigarettes every day  06-02-2021  Vaping Use   Vaping status: Never Used  Substance and Sexual Activity   Alcohol use: Yes    Alcohol/week: 6.0 standard drinks of alcohol    Types: 6 Cans of beer per week   Drug use: Not Currently    Types: Marijuana    Comment: Uses daily   Sexual activity: Not Currently  Other Topics Concern   Not on file  Social History Narrative   Patient lives at home with [place].    Patient has 3 children 2 step.    Patient has 13 years of schooling.    Patient is right handed.    Social Drivers of Health   Tobacco Use: High Risk (08/05/2024)   Patient History    Smoking Tobacco Use: Every Day    Smokeless Tobacco Use: Never    Passive Exposure: Never  Financial Resource Strain: Low Risk (11/15/2022)   Overall Financial Resource Strain (CARDIA)    Difficulty of Paying Living Expenses: Not hard at all  Food Insecurity: No Food  Insecurity (03/04/2024)   Epic    Worried About Programme Researcher, Broadcasting/film/video in the Last Year: Never true    Ran Out of Food in the Last Year: Never true  Transportation Needs: No Transportation Needs (03/04/2024)   Epic    Lack of Transportation (Medical): No    Lack of Transportation (Non-Medical): No  Physical Activity: Unknown (11/15/2022)   Exercise Vital Sign    Days of Exercise per Week: 0 days    Minutes of Exercise per Session: Not on file  Stress: No Stress Concern Present (11/15/2022)   [employer] of Occupational Health - Occupational Stress Questionnaire    Feeling of Stress : Not at all  Social Connections: Socially Isolated (11/15/2022)   Social Connection and Isolation Panel    Frequency of Communication with Friends and Family: Twice a week    Frequency of Social Gatherings with Friends and Family: Once a week    Attends Religious Services: Never    Database Administrator or Organizations: No    Attends Engineer, Structural: Not on file    Marital Status: Never married  Depression (PHQ2-9): Low Risk (03/04/2024)   Depression (PHQ2-9)    PHQ-2 Score: 1  Alcohol Screen: Low Risk (11/15/2022)   Alcohol Screen    Last Alcohol Screening Score (AUDIT): 2  Housing: Low Risk (03/04/2024)   Epic    Unable to Pay for Housing in the Last Year: No    Number of Times Moved in the Last Year: 0    Homeless in the Last Year: No  Utilities: Not At Risk (03/04/2024)   Epic    Threatened with loss of utilities: No  Health Literacy: Not on file    Allergies[1]  Outpatient Medications Prior to Visit  Medication Sig Dispense Refill   Accu-Chek Softclix Lancets lancets Use as instructed 100 each 12   atorvastatin  (LIPITOR) 40 MG tablet Take 1 tablet (40 mg total) by mouth daily. 90 tablet 1   azelastine  (ASTELIN ) 0.1 % nasal spray Place 1 spray into both nostrils 2 (two) times daily. 1 spray each nostril twice a day (Patient taking differently: Place 1 spray into both nostrils 2  (two) times daily as needed for rhinitis or allergies.) 90 mL 1   brimonidine (ALPHAGAN) 0.2 % ophthalmic solution Place 1 drop into both eyes every 8 (eight) hours.  clindamycin  (CLEOCIN  T) 1 % SWAB Apply 1 application  topically daily. Apply to face, back and buttocks daily 60 each 4   cromolyn  (OPTICROM ) 4 % ophthalmic solution PLACE 1 DROP INTO BOTH EYES 4 (FOUR) TIMES DAILY AS NEEDED. 30 mL 1   dorzolamide-timolol (COSOPT) 22.3-6.8 MG/ML ophthalmic solution Place 1 drop into both eyes 2 (two) times daily.     doxycycline  (VIBRA -TABS) 100 MG tablet TAKE 1 TABLET (100 MG TOTAL) BY MOUTH DAILY WITH FOOD AND DRINK PLENTY OF FLUIDS. 30 tablet 4   ELIQUIS  5 MG TABS tablet TAKE 1 TABLET BY MOUTH TWICE A DAY (Patient taking differently: Take 5 mg by mouth daily.) 60 tablet 5   EPINEPHrine  0.3 mg/0.3 mL IJ SOAJ injection Inject 0.3 mg into the muscle as needed for anaphylaxis. 0.3 mL 1   folic acid (FOLVITE) 1 MG tablet Take 1 mg by mouth daily.     GARLIC PO Take 1 tablet by mouth daily.     glucose blood (ACCU-CHEK GUIDE TEST) test strip Use as instructed 100 strip 1   latanoprost (XALATAN) 0.005 % ophthalmic solution Place 1 drop into both eyes daily.     Misc. Devices MISC Back brace. Diagnosis chronic back pain 1 each 0   Misc. Devices MISC Left knee brace. Diagnosis L knee pain 1 each 0   oxycodone  (OXY-IR) 5 MG capsule Take 1 capsule (5 mg total) by mouth every 4 (four) hours as needed. 30 capsule 0   oxyCODONE  (ROXICODONE ) 5 MG immediate release tablet Take 1 tablet (5 mg total) by mouth every 12 (twelve) hours as needed for severe pain (pain score 7-10). 30 tablet 0   RETIN-A  0.05 % cream APPLY PEA SIZE AMOUNT TO FACE AT BEDTIME EVERY OTHER NIGHT. APPLY QUARTER SIZE AMOUNT MIXED WITH MOISTURIZER AND APPLY TO BACK AND BUTTOCKS EVERY OTHER NIGHT 45 g 0   Vitamin D3 (VITAMIN D) 25 MCG tablet Take 1,000 Units by mouth daily.     DULoxetine  (CYMBALTA ) 60 MG capsule Take 1 capsule (60 mg total)  by mouth daily. For chronic pain and neuropathy 90 capsule 1   furosemide  (LASIX ) 20 MG tablet Take 1 tablet (20 mg total) by mouth daily. 90 tablet 1   methocarbamol  (ROBAXIN ) 500 MG tablet Take 1 tablet (500 mg total) by mouth every 8 (eight) hours as needed for muscle spasms. 90 tablet 3   valsartan  (DIOVAN ) 80 MG tablet Take 1 tablet (80 mg total) by mouth daily. 90 tablet 1   albuterol  (VENTOLIN  HFA) 108 (90 Base) MCG/ACT inhaler Inhale 2 puffs into the lungs every 4 (four) hours as needed for wheezing or shortness of breath. 18 each 1   cetirizine  (ZYRTEC ) 10 MG tablet Take 1 tablet (10 mg total) by mouth daily as needed for allergies (Can take an extra dose during flare ups.). 180 tablet 1   cromolyn  (OPTICROM ) 4 % ophthalmic solution Place 2 drops into both eyes 4 (four) times daily as needed. 10 mL 5   fluticasone  (FLONASE ) 50 MCG/ACT nasal spray Place 1 spray into both nostrils 2 (two) times daily. 1 spray each nostril twice a day 48 g 1   fluticasone -salmeterol (ADVAIR) 250-50 MCG/ACT AEPB Inhale 1 puff into the lungs daily. 60 each 1   ipratropium (ATROVENT ) 0.03 % nasal spray Place 2 sprays into both nostrils 3 (three) times daily. 30 mL 5   montelukast  (SINGULAIR ) 10 MG tablet Take 1 tablet (10 mg total) by mouth at bedtime. 90 tablet 1  No facility-administered medications prior to visit.     ROS Review of Systems  Constitutional:  Negative for activity change and appetite change.  HENT:  Negative for sinus pressure and sore throat.   Respiratory:  Negative for chest tightness, shortness of breath and wheezing.   Cardiovascular:  Negative for chest pain and palpitations.  Gastrointestinal:  Negative for abdominal distention, abdominal pain and constipation.  Genitourinary: Negative.   Musculoskeletal:  Positive for back pain.  Psychiatric/Behavioral:  Negative for behavioral problems and dysphoric mood.     Objective:  BP (!) 149/81   Pulse 79   Temp 98.3 F (36.8 C)  (Oral)   Ht 6' 1 (1.854 m)   Wt 237 lb 6.4 oz (107.7 kg)   SpO2 99%   BMI 31.32 kg/m      08/05/2024    2:10 PM 08/05/2024    1:44 PM 07/29/2024   11:06 AM  BP/Weight  Systolic BP 149 141 120  Diastolic BP 81 82 84  Wt. (Lbs)  237.4   BMI  31.32 kg/m2       Physical Exam Constitutional:      Appearance: He is well-developed.  Cardiovascular:     Rate and Rhythm: Normal rate.     Heart sounds: Normal heart sounds. No murmur heard. Pulmonary:     Effort: Pulmonary effort is normal.     Breath sounds: Normal breath sounds. No wheezing or rales.  Chest:     Chest wall: No tenderness.  Abdominal:     General: Bowel sounds are normal. There is no distension.     Palpations: Abdomen is soft. There is no mass.     Tenderness: There is no abdominal tenderness.  Musculoskeletal:        General: Tenderness (R lateral aspect of Lumbar spine) present.     Right lower leg: No edema.     Left lower leg: Edema (1+) present.     Comments: Positive straight leg raise on the right, negative on the left   Neurological:     Mental Status: He is alert and oriented to person, place, and time.  Psychiatric:        Mood and Affect: Mood normal.    Diabetic Foot Exam - Simple   Simple Foot Form Diabetic Foot exam was performed with the following findings: Yes 08/05/2024  2:13 PM  Visual Inspection No deformities, no ulcerations, no other skin breakdown bilaterally: Yes Sensation Testing Intact to touch and monofilament testing bilaterally: Yes Pulse Check Posterior Tibialis and Dorsalis pulse intact bilaterally: Yes Comments        Latest Ref Rng & Units 04/22/2024   10:00 AM 01/31/2024    2:55 PM 07/24/2023    3:00 PM  CMP  Glucose 70 - 99 mg/dL 898  90  879   BUN 8 - 23 mg/dL 9  9  8    Creatinine 0.61 - 1.24 mg/dL 8.86  8.98  8.88   Sodium 135 - 145 mmol/L 138  141  139   Potassium 3.5 - 5.1 mmol/L 3.9  4.0  4.1   Chloride 98 - 111 mmol/L 106  106  106   CO2 22 - 32 mmol/L 24   22  25    Calcium  8.9 - 10.3 mg/dL 8.7  9.0  8.9   Total Protein 6.0 - 8.5 g/dL  6.9    Total Bilirubin 0.0 - 1.2 mg/dL  0.3    Alkaline Phos 44 - 121 IU/L  117  AST 0 - 40 IU/L  12    ALT 0 - 44 IU/L  10      Lipid Panel     Component Value Date/Time   CHOL 148 05/24/2023 1109   TRIG 159 (H) 05/24/2023 1109   HDL 42 05/24/2023 1109   CHOLHDL 5.2 (H) 05/07/2018 1004   CHOLHDL 5.4 10/17/2013 1042   VLDL 36 10/17/2013 1042   LDLCALC 79 05/24/2023 1109    CBC    Component Value Date/Time   WBC 5.0 04/22/2024 1000   RBC 5.50 04/22/2024 1000   HGB 12.2 (L) 04/22/2024 1000   HGB 11.5 (L) 08/03/2023 1209   HCT 40.4 04/22/2024 1000   HCT 37.1 (L) 08/03/2023 1209   PLT 234 04/22/2024 1000   PLT 247 08/03/2023 1209   MCV 73.5 (L) 04/22/2024 1000   MCV 77 (L) 08/03/2023 1209   MCH 22.2 (L) 04/22/2024 1000   MCHC 30.2 04/22/2024 1000   RDW 17.1 (H) 04/22/2024 1000   RDW 17.5 (H) 08/03/2023 1209   LYMPHSABS 1.3 08/03/2023 1209   MONOABS 0.7 08/08/2019 1430   EOSABS 0.1 08/03/2023 1209   BASOSABS 0.0 08/03/2023 1209    Lab Results  Component Value Date   HGBA1C 5.7 08/05/2024    Is patient on antiplatelet or anticoagulation (if cardioembolic)? Yes  Is patient compliant with medication? Yes  Is patient on a Statin and LDL <70? No  Is HbA1c < 7? Yes  Is BP < 130/80 mmHg? No  If Smoker, has information of Virtual smoking cessation program been provided? Yes  Is patient involved or interested in joining a support group? No     Assessment & Plan Lumbar radiculopathy with degenerative disc disease Chronic lumbar radiculopathy with degenerative disc disease, primarily right-sided. Symptoms flaring for two weeks. Prefers non-surgical management. Methocarbamol  used for muscle relaxation. Duloxetine  discontinued due to adverse effects. - Referred to orthopedic specialist for further evaluation and management. - Continue methocarbamol  as needed for muscle spasms.  Type  2 diabetes mellitus Well-controlled with A1c of 5.7%. Diet controlled -Counseled on Diabetic diet, the healthy plate, 849 minutes of moderate intensity exercise/week Blood sugar logs with fasting goals of 80-120 mg/dl, random of less than 819 and in the event of sugars less than 60 mg/dl or greater than 599 mg/dl encouraged to notify the clinic. Advised on the need for annual eye exams, annual foot exams, Pneumonia vaccine.   Hypertension associated with type 2 diabetes mellitus Blood pressure slightly elevated, possibly due to pain. Current medications include carvedilol  and valsartan . - Rechecked blood pressure still elevated - Continue current antihypertensive regimen including carvedilol  and valsartan . - Low-dose Toprol -XL added to regimen for optimization  Paroxysmal atrial fibrillation Managed with Eliquis . Lifelong anticoagulation therapy required. - Continue Eliquis  as prescribed. - Low-dose beta-blocker added - Continue to follow-up with electrophysiology  Allergic rhinitis and mild intermittent asthma Managed with immunotherapy and nebulizer treatments. Recent flare-ups possibly due to environmental changes. - Continue immunotherapy and nebulizer treatments as prescribed.   Pedal edema Lower extremity edema secondary to venous insufficiency and prior vascular surgery Chronic edema, primarily in the left leg, likely due to venous insufficiency and prior vascular surgery. - Use compression stockings to manage edema.  General health maintenance Due for pneumonia vaccine. Flu shot recently received. - Administered pneumonia vaccine.      Meds ordered this encounter  Medications   furosemide  (LASIX ) 20 MG tablet    Sig: Take 1 tablet (20 mg total) by mouth daily.  Dispense:  90 tablet    Refill:  1   methocarbamol  (ROBAXIN ) 500 MG tablet    Sig: Take 1 tablet (500 mg total) by mouth every 8 (eight) hours as needed for muscle spasms.    Dispense:  90 tablet     Refill:  3   valsartan  (DIOVAN ) 80 MG tablet    Sig: Take 1 tablet (80 mg total) by mouth daily.    Dispense:  90 tablet    Refill:  1   metoprolol  succinate (TOPROL -XL) 25 MG 24 hr tablet    Sig: Take 1 tablet (25 mg total) by mouth daily.    Dispense:  90 tablet    Refill:  1    Follow-up: Return in about 6 months (around 02/02/2025) for Chronic medical conditions.       Corrina Sabin, MD, FAAFP. [facility] [place], [place] [phone]   08/05/2024, 3:05 PM     [1]  Allergies Allergen Reactions   Lisinopril Anaphylaxis    Swelling of lips and tongue.   Tylenol  [Acetaminophen ] Other (See Comments)    Acid reflux    Tramadol  Other (See Comments)    Nighmares   Duloxetine  Palpitations   Lyrica  [Pregabalin ] Palpitations   "

## 2024-08-05 NOTE — Patient Instructions (Signed)
 VISIT SUMMARY:  During your visit, we discussed your worsening right-sided back pain, which has been persistent for the past two weeks. We also reviewed your other health conditions, including diabetes, high blood pressure, atrial fibrillation, asthma, and leg swelling. Your diabetes is well-controlled, and we addressed your blood pressure, asthma, and leg swelling management. You received a pneumonia vaccine today.  YOUR PLAN:  -LUMBAR RADICULOPATHY WITH DEGENERATIVE DISC DISEASE: This condition involves nerve pain due to wear and tear on the discs in your lower spine. We will refer you to an orthopedic specialist for further evaluation and management. Continue taking methocarbamol  as needed for muscle spasms.  -TYPE 2 DIABETES MELLITUS: Your diabetes is well-controlled with a recent A1c of 5.7%. No changes to your current management plan are needed.  -HYPERTENSION: Your blood pressure is slightly elevated, possibly due to pain. Continue taking your current medications, carvedilol  and valsartan , and we will monitor your blood pressure.  -PAROXYSMAL ATRIAL FIBRILLATION: This is an irregular heart rhythm that comes and goes. Continue taking Eliquis  as prescribed for lifelong anticoagulation therapy.  -ALLERGIC RHINITIS AND MILD INTERMITTENT ASTHMA: These conditions involve allergies and occasional asthma symptoms. Continue your immunotherapy and nebulizer treatments as prescribed.  -LOWER EXTREMITY EDEMA SECONDARY TO VENOUS INSUFFICIENCY AND PRIOR VASCULAR SURGERY: This is swelling in your left leg due to poor blood flow and previous surgery. Continue using compression stockings to manage the swelling.  -GENERAL HEALTH MAINTENANCE: You are due for a pneumonia vaccine, which was administered today. You recently received a flu shot.  INSTRUCTIONS:  Follow up with the orthopedic specialist as referred for your back pain. Continue your current medications and treatments as discussed. Monitor your  blood pressure and manage your leg swelling with compression stockings. If you have any new or worsening symptoms, please contact our office.

## 2024-08-06 ENCOUNTER — Ambulatory Visit: Admitting: Physical Therapy

## 2024-08-06 ENCOUNTER — Encounter: Payer: Self-pay | Admitting: Physical Therapy

## 2024-08-06 ENCOUNTER — Other Ambulatory Visit: Payer: Self-pay

## 2024-08-06 DIAGNOSIS — G8929 Other chronic pain: Secondary | ICD-10-CM

## 2024-08-06 DIAGNOSIS — J4550 Severe persistent asthma, uncomplicated: Secondary | ICD-10-CM

## 2024-08-06 DIAGNOSIS — R293 Abnormal posture: Secondary | ICD-10-CM

## 2024-08-06 DIAGNOSIS — M25511 Pain in right shoulder: Secondary | ICD-10-CM | POA: Diagnosis not present

## 2024-08-06 MED ORDER — BUDESONIDE 1 MG/2ML IN SUSP: RESPIRATORY_TRACT | 1 refills | Status: DC

## 2024-08-06 NOTE — Therapy (Signed)
 " OUTPATIENT PHYSICAL THERAPY DAILY NOTE   Patient Name: Jason Stokes MRN: [id number] DOB:01/25/1961, 64 y.o., male Today's Date: 08/06/2024  END OF SESSION:  PT End of Session - 08/06/24 1028     Visit Number 3    Number of Visits 16    Date for Recertification  09/19/24    Authorization Type [employer] MCD    PT Start Time 1028   pt arrived late   PT Stop Time 1058    PT Time Calculation (min) 30 min    Activity Tolerance Patient limited by pain    Behavior During Therapy [facility] for tasks assessed/performed          Past Medical History:  Diagnosis Date   Anemia    Aneurysm of right internal iliac artery    a.) s/p embolization 08/19/2020: 29 mm RIGHT internal iliac artery aneurysm   Anxiety    Aortic atherosclerosis    Bilateral carpal tunnel syndrome 01/10/2018   Bipolar disorder (HCC)    Chorioretinal inflammation of both eyes    a.) on azothioprine   Chronic lower back pain    Coronary artery calcification seen on CT scan    a.) cCTA 04/21/2022: Ca score 20.6 (61st percentile for age/sex match control)   DDD (degenerative disc disease), cervical    Depression    Diastolic dysfunction    a.) TTE 01/13/2021: EF 60-65%, mod LVH, triv MR, G1DD   GERD (gastroesophageal reflux disease)    Hepatic steatosis    History of alcohol abuse    History of nuclear stress test    Myoview 10/16: EF 50%, diaphragmatic attenuation, no ischemia, low risk   Hypertension    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   a.) CT head 12/25/2012 --> RIGHT basal ganglia hypoattenuation related to remote lacunar infarct   Lipoma    Long term (current) use of anticoagulants    a.) apixaban    Long-term current use of immunomodulator    a.) on azothioprine for peripheral focal chorioretinal inflammation (both eyes)   Marijuana use    Migraine    Moderate persistent asthma with acute exacerbation 05/02/2018   PAF (paroxysmal atrial fibrillation) (HCC) 03/27/2015   a.) CHA2DS2VASc = 5 (HTN, CVA x2,  vascular disease history, T2DM);  b.) s/p ablation 10/02/2015; c.) s/p ablation 12/10/2015; d.) s/p DCCV (200 J x 1) 12/11/2015; e.) s/p ablation 04/28/2022; f.) rate/rhythm maintained on oral diltiazem  + carvedilol ; chronically anticoagulated with apixaban    Pilonidal cyst    Schizophrenia (HCC)    Sciatica neuralgia    Stroke (HCC)    T2DM (type 2 diabetes mellitus) (HCC)    Thoracic aortic ectasia 01/13/2021   a.) TTE 01/13/2021: Ao root 38 mm, asc Ao 39 mm   Past Surgical History:  Procedure Laterality Date   ATRIAL FIBRILLATION ABLATION  09/22/2015   ATRIAL FIBRILLATION ABLATION N/A 04/28/2022   Procedure: ATRIAL FIBRILLATION ABLATION;  Surgeon: Inocencio Soyla Lunger, MD;  Location: [facility];  Service: Cardiovascular;  Laterality: N/A;   CATARACT EXTRACTION Bilateral    CYST EXCISION  1996-97   surgery back of head    ELECTROPHYSIOLOGIC STUDY N/A 09/22/2015   Procedure: Atrial Fibrillation Ablation;  Surgeon: Will Lunger Inocencio, MD;  Location: [facility];  Service: Cardiovascular;  Laterality: N/A;   ELECTROPHYSIOLOGIC STUDY N/A 12/10/2015   Procedure: Atrial Fibrillation Ablation;  Surgeon: Will Lunger Inocencio, MD;  Location: [facility];  Service: Cardiovascular;  Laterality: N/A;   ELECTROPHYSIOLOGIC STUDY N/A 12/11/2015  Procedure: Cardioversion;  Surgeon: Will Gladis Norton, MD;  Location: [facility];  Service: Cardiovascular;  Laterality: N/A;   EMBOLIZATION (CATH LAB) Right 08/19/2020   Procedure: EMBOLIZATION;  Surgeon: Magda Debby SAILOR, MD;  Location: [facility];  Service: Cardiovascular;  Laterality: Right;  hypogastric   EXCISION MASS HEAD N/A 01/06/2017   Procedure: EXCISION MASS FOREHEAD;  Surgeon: Arelia Filippo, MD;  Location: [facility];  Service: Plastics;  Laterality: N/A;   EXCISION MASS UPPER EXTREMETIES Right 08/08/2022   Procedure: EXCISION MASS RIGHT FOREARM;  Surgeon: Murrell Drivers, MD;  Location:  [facility];  Service: Orthopedics;  Laterality: Right;  45 MIN   GANGLION CYST EXCISION Left    INTERCOSTAL NERVE BLOCK  07/19/2003   KNEE ARTHROSCOPY Right 07/18/2014   MASS EXCISION N/A 01/25/2021   Procedure: EXCISION SUBCUTANEOUS VS SUBFASCIAL MASS TORSO 3CM;  Surgeon: Arelia Filippo, MD;  Location: [facility];  Service: Plastics;  Laterality: N/A;   OPEN SUBSCAPULARIS REPAIR Right 04/25/2024   Procedure: OPEN REPAIR RIGHT SHOULDER SUBSCAPULARIS;  Surgeon: Addie Cordella Hamilton, MD;  Location: [facility];  Service: Orthopedics;  Laterality: Right;   PILONIDAL CYST EXCISION N/A 09/13/2022   Procedure: CYST EXCISION PILONIDAL EXTENSIVE;  Surgeon: Desiderio Schanz, MD;  Location: [facility];  Service: General;  Laterality: N/A;   SHOULDER ARTHROSCOPY WITH ROTATOR CUFF REPAIR AND OPEN BICEPS TENODESIS Left 07/26/2023   Procedure: LEFT SHOULDER ARTHROSCOPY WITH OPEN ROTATOR CUFF REPAIR AND OPEN BICEPS TENODESIS;  Surgeon: Barbarann Oneil BROCKS, MD;  Location: [facility];  Service: Orthopedics;  Laterality: Left;   Patient Active Problem List   Diagnosis Date Noted   Epistaxis 07/29/2024   Seasonal allergic rhinitis due to pollen 07/29/2024   Asthma, severe persistent, well-controlled (HCC) 07/29/2024   Degenerative superior labral anterior-to-posterior (SLAP) tear of right shoulder 05/11/2024   Synovitis of right shoulder 05/11/2024   Chronic anticoagulation 02/19/2024   Shoulder joint pain 02/19/2024   Tear of left rotator cuff 02/19/2024   Impingement syndrome of right shoulder 10/03/2023   Tendinitis of upper biceps tendon of left shoulder 06/12/2023   Complete tear of right rotator cuff 10/28/2022   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   Pilonidal cyst 09/13/2022   Aortic atherosclerosis 06/07/2022   Mass of right upper extremity 03/30/2022   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, moderate stage 03/28/2022   Peripheral focal  chorioretinal inflammation of both eyes 03/28/2022   Pseudophakia of both eyes 03/28/2022   Retinal edema 03/28/2022   Hypercoagulable state due to paroxysmal atrial fibrillation (HCC) 03/23/2021   Type 2 diabetes mellitus with hyperglycemia, without long-term current use of insulin  (HCC) 02/10/2021   Lumbar stenosis with neurogenic claudication 06/01/2020   Aneurysm of iliac artery 05/28/2020   Neck pain on left side 03/06/2019   Musculoskeletal chest pain 07/24/2018   Asthma, mild intermittent 05/02/2018   Seasonal and perennial allergic rhinitis 05/02/2018   Tobacco use 05/02/2018   Primary osteoarthritis of both hands 01/26/2018   Bilateral carpal tunnel syndrome 01/10/2018   Stroke (HCC)    Schizophrenia (HCC)    Migraine    History of nuclear stress test    History of alcohol abuse    GERD (gastroesophageal reflux disease)    Dysrhythmia    Chronic lower back pain    Anxiety    Arthritis of knee, right 04/12/2017   Bipolar disorder (HCC) 04/03/2017   Lipoma of forehead 09/22/2016   Trigger  ring finger of right hand 06/22/2016   Paroxysmal atrial fibrillation (HCC)    AF (atrial fibrillation) (HCC) 12/10/2015   Eczema 07/10/2015   History of CVA (cerebrovascular accident) 05/04/2015   Tear of medial meniscus of right knee 03/18/2015   Chronic pain of right knee 03/12/2015   Other and unspecified hyperlipidemia 11/25/2013   Nasal congestion 11/25/2013   Sinusitis, chronic 05/09/2013   Chronic low back pain 10/12/2012   Lumbar radiculopathy 10/12/2012   Hypertension associated with diabetes (HCC) 10/12/2012   Depression 10/12/2012   Insomnia 10/12/2012    PCP: Delbert Clam, MD   REFERRING PROVIDER:  Addie Cordella Hamilton, MD     REFERRING DIAG:  [phone] (ICD-10-CM) - S/P right rotator cuff repair  active assisted range of motion and passive range of motion of the right shoulder 2 times a week for 6 weeks.  Do not really want him to do any more external rotation  than what he has now which is about 45 to 50 degrees.    THERAPY DIAG:  Chronic right shoulder pain  Abnormal posture  Rationale for Evaluation and Treatment: Rehabilitation  ONSET DATE: 04/25/2024 DOS  SUBJECTIVE:                                                                                                                                                                                      SUBJECTIVE STATEMENT: Pt reports that the shoulder pain is a little less.  Having more sciatic nerve pain today.  Hand dominance: Right  PERTINENT HISTORY: Relevant OMHx includes R SLAP tear, chronic anticoagulation, R UE mass, glaucoma, type 2 DM, Lumbar stenosis with neurogenic claudication, BIL CTS, stroke, schizophrenia, anxiety, bipolar disorder, paroxysmal atrial fibrillation, depression, insomnia, anemia, cervical DDD, HTN  PAIN:  Are you having pain?  Yes: NPRS scale: 7/10 Pain location: where the screws are at Pain description: aching, throbbing, sharp Aggravating factors: movement  Relieving factors: muscle relaxers  PRECAUTIONS: None  RED FLAGS: None   WEIGHT BEARING RESTRICTIONS: No  FALLS:  Has patient fallen in last 6 months? Yes. Number of falls 1, while walking up the hill to his daughter's house. R knee gave out on you.   LIVING ENVIRONMENT: Lives with: lives with their spouse Lives in: House/apartment Stairs: No Has following equipment at home: Single point cane, Environmental Consultant - 2 wheeled, shower chair, and Grab bars  OCCUPATION: N/a  PLOF: Independent  PATIENT GOALS:to be able to move arm, sleep, and babysit his grandkids without significant pain   NEXT MD VISIT:   OBJECTIVE:  Note: Objective measures were completed at Evaluation unless otherwise noted.  DIAGNOSTIC FINDINGS:  No post op imaging results  available in epic; none included in referral    PATIENT SURVEYS:  Quick Dash:  QUICK DASH  Please rate your ability do the following activities in the  last week by selecting the number below the appropriate response.   Activities Rating  Open a tight or new jar.  4 = Severe difficulty  Do heavy household chores (e.g., wash walls, floors). 2 = Mild difficulty  Carry a shopping bag or briefcase 3 = Moderate difficulty  Wash your back. 2 = Mild difficulty  Use a knife to cut food. 4 = Severe difficulty  Recreational activities in which you take some force or impact through your arm, shoulder or hand (e.g., golf, hammering, tennis, etc.). 4 = Severe difficulty  During the past week, to what extent has your arm, shoulder or hand problem interfered with your normal social activities with family, friends, neighbors or groups?  4 = Quite a bit  During the past week, were you limited in your work or other regular daily activities as a result of your arm, shoulder or hand problem? 3 = Moderately limited  Rate the severity of the following symptoms in the last week: Arm, Shoulder, or hand pain. 5 = Extreme  Rate the severity of the following symptoms in the last week: Tingling (pins and needles) in your arm, shoulder or hand. 4 = Severe  During the past week, how much difficulty have you had sleeping because of the pain in your arm, shoulder or hand?  5 = So much difficulty that I can't sleep   (A QuickDASH score may not be calculated if there is greater than 1 missing item.)  Quick Dash Disability/Symptom Score:  65.9  Minimally Clinically Important Difference (MCID): 15-20 points  Flavio, F. et al. (2013). Minimally clinically important difference of the disabilities of the arm, shoulder, and hand outcome measures (DASH) and its shortened version (Quick DASH). Journal of Orthopaedic & Sports Physical Therapy, 44(1), 30-39)   COGNITION: Overall cognitive status: Within functional limits for tasks assessed     SENSATION: Not tested  POSTURE: Rounded shoulders, forward head   UPPER EXTREMITY ROM:   Active ROM Right eval Left eval   Shoulder flexion 110   Shoulder extension    Shoulder abduction 70   Shoulder adduction    Shoulder internal rotation 60   Shoulder external rotation 56   Elbow flexion    Elbow extension    Wrist flexion    Wrist extension    Wrist ulnar deviation    Wrist radial deviation    Wrist pronation    Wrist supination    (Blank rows = not tested)  UPPER EXTREMITY MMT:  MMT Right eval Left eval  Shoulder flexion    Shoulder extension    Shoulder abduction    Shoulder adduction    Shoulder internal rotation    Shoulder external rotation    Middle trapezius    Lower trapezius    Elbow flexion    Elbow extension    Wrist flexion    Wrist extension    Wrist ulnar deviation    Wrist radial deviation    Wrist pronation    Wrist supination    Grip strength (lbs)    (Blank rows = not tested)  TREATMENT DATE:   [facility] Adult PT Treatment  08/07/2023:  Therapeutic Exercise: PROM flexion and scaption Chest press with dowel - 2# - 2x10 Supine flexion ROM - non-painful arc - 2x10 - 2# Pain free isometrics IR/ER - emphasis on pain free like you're picking up an empty coffee cup Pain free lateral towel slides Towel slide Row - GTB - 2x8x5s    HOME EXERCISE PROGRAM: Access Code: [email or web address] URL: [email or web address] Date: 08/02/2024 Prepared by: Helene Gasmen  Exercises - Supine Shoulder Flexion Extension AAROM with Dowel  - 1 x daily - 7 x weekly - 2 sets - 10 reps - 5 sec hold - Standing Shoulder Abduction Slides at Wall  - 1 x daily - 7 x weekly - 2 sets - 10 reps - 5 sec hold - Standing shoulder flexion wall slides  - 1 x daily - 7 x weekly - 2 sets - 10 reps - 5 sec hold - Seated Scapular Retraction  - 1 x daily - 7 x weekly - 2 sets - 10 reps - 3 sec hold - Seated Cervical Retraction  - 1 x daily - 7 x weekly - 2 sets - 10 reps  - 3 sec hold - Standing Isometric Shoulder Internal Rotation at Doorway  - 1 x daily - 7 x weekly - 2 sets - 5 reps - 15 sec hold - Standing Isometric Shoulder External Rotation with Doorway  - 3 x daily - 7 x weekly - 2 sets - 5 reps - 15 sec hold  ASSESSMENT:  CLINICAL IMPRESSION:   08/06/2024:  Alexa tolerated session well with no adverse reaction.  Discussed the importance of avoiding stretching into ER and avoiding pain.  Continued with gentle isometrics and introduced towel sides.  Towel slides were initially pain free but pain did increase with more reps and improved with rests.  Discussed the importance of stopping at onset of pain.SABRA  EVAL: Karsen is a 64 y.o. male who was seen today for physical therapy evaluation and treatment for R shoulder pain with mobility deficits 3 months s/p R shoulder arthroscopy with open subscap repair augmented with patch . He is demonstrating decreased R shoulder AROM, decreased postural mm endurance, and has post op strength deficits. He has BIL UE numbness and tingling, with history of BIL CTS. He has related pain and difficulty with UE movement, lifting, reaching, ADLs/IADLs and has decreased sleep quality d/t pain. He requires skilled PT services at this time to address relevant deficits and improve overall function.     OBJECTIVE IMPAIRMENTS: decreased activity tolerance, decreased ROM, decreased strength, impaired UE functional use, improper body mechanics, postural dysfunction, and pain.   ACTIVITY LIMITATIONS: carrying, lifting, sleeping, bathing, dressing, reach over head, and hygiene/grooming  PARTICIPATION LIMITATIONS: meal prep, cleaning, laundry, interpersonal relationship, driving, shopping, and community activity  PERSONAL FACTORS: Past/current experiences, Time since onset of injury/illness/exacerbation, and 3+ comorbidities: Relevant OMHx includes R SLAP tear, chronic anticoagulation, R UE mass, glaucoma, type 2 DM, Lumbar stenosis with  neurogenic claudication, BIL CTS, stroke, schizophrenia, anxiety, bipolar disorder, paroxysmal atrial fibrillation, depression, insomnia, anemia, cervical DDD, HTN are also affecting patient's functional outcome.   REHAB POTENTIAL: Fair    CLINICAL DECISION MAKING: Evolving/moderate complexity  EVALUATION COMPLEXITY: Moderate   GOALS: Goals reviewed with patient? YES  SHORT TERM GOALS: Target date: 08/22/2024   Patient will be independent with initial home program at least 3 days/week.  Baseline: provided at eval Goal Status: INITIAL   2.  Patient will demonstrate  improved postural awareness for at least 15 minutes while seated without need for cueing from PT.  Baseline: see objective measures Goal Status: INITIAL   3.  Patient will demonstrate at least 20 degree improvement of R shoulder flexion and abduction AROM.  Baseline: see objective measures Goal status: INITIAL   LONG TERM GOALS: Target date: 09/19/2024    Patient will report improved overall functional ability with QuickDASH score of 20 or less.  Baseline: 65.9 Goal Status: INITIAL   2.  Patient will demonstrate at least 4+/5 MMT  Baseline: see objective measures  Goal status: INITIAL  3.  Patient will demonstrate ability to perform overhead lifting of at least 8# using appropriate body mechanics and with no more than minimal pain in order to safely perform normal daily tasks.   Baseline: unable   Goal Status: INITIAL   4.  Patient will report ability to sleep at least 6 hours/night with minimal pain-related sleep disturbance Baseline: unable to sleep d/t pain and hand numbness Goal status: INITIAL     PLAN:  PT FREQUENCY: 1-2x/week  PT DURATION: 8 weeks  PLANNED INTERVENTIONS: 97164- PT Re-evaluation, 97750- Physical Performance Testing, 97110-Therapeutic exercises, 97530- Therapeutic activity, 97112- Neuromuscular re-education, 97535- Self Care, 02859- Manual therapy, G0283- Electrical stimulation  (unattended), [phone]- Ionotophoresis 4mg /ml Dexamethasone , 79439 (1-2 muscles), 20561 (3+ muscles)- Dry Needling, Patient/Family education, Taping, Scar mobilization, Cryotherapy, and Moist heat  For all possible CPT codes, reference the Planned Interventions line above.     Check all conditions that are expected to impact treatment: {Conditions expected to impact treatment:Diabetes mellitus, Psychological or psychiatric disorders, and Social determinants of health      PLAN FOR NEXT SESSION: focus on PROM and AAROM per surgeon's rx, modalities, update HEP as indiciated    Helene BRAVO Fountain Derusha PT 08/06/2024 10:59 AM   "

## 2024-08-07 ENCOUNTER — Telehealth: Payer: Self-pay

## 2024-08-07 NOTE — Telephone Encounter (Signed)
 Fax received from Alfonso Raiano-Hedrick/ [facility] requesting PCS for patient.  I called the patient to inquire if he currently receives PCS, message left with call back requested.

## 2024-08-07 NOTE — Telephone Encounter (Signed)
 Copied from CRM #[id number]. Topic: General - Other >> Aug 07, 2024 11:13 AM   Adelita E wrote:  Reason for CRM: Patient returning call from [place]. Relayed message to patient and he stated he has been receiving PCS for quite a few years now.

## 2024-08-07 NOTE — Telephone Encounter (Signed)
 noted

## 2024-08-08 ENCOUNTER — Ambulatory Visit

## 2024-08-08 ENCOUNTER — Ambulatory Visit: Payer: Self-pay | Attending: Family Medicine

## 2024-08-08 ENCOUNTER — Other Ambulatory Visit: Payer: Self-pay

## 2024-08-08 DIAGNOSIS — J302 Other seasonal allergic rhinitis: Secondary | ICD-10-CM | POA: Diagnosis not present

## 2024-08-08 DIAGNOSIS — J4550 Severe persistent asthma, uncomplicated: Secondary | ICD-10-CM

## 2024-08-08 DIAGNOSIS — E1169 Type 2 diabetes mellitus with other specified complication: Secondary | ICD-10-CM

## 2024-08-08 MED ORDER — BUDESONIDE 1 MG/2ML IN SUSP: RESPIRATORY_TRACT | 1 refills | Status: AC

## 2024-08-09 ENCOUNTER — Ambulatory Visit: Admitting: Physical Therapy

## 2024-08-09 ENCOUNTER — Ambulatory Visit: Payer: Self-pay | Admitting: Family Medicine

## 2024-08-09 DIAGNOSIS — E1169 Type 2 diabetes mellitus with other specified complication: Secondary | ICD-10-CM

## 2024-08-09 LAB — LP+NON-HDL CHOLESTEROL
Cholesterol, Total: 183 mg/dL (ref 100–199)
HDL: 36 mg/dL — ABNORMAL LOW
LDL Chol Calc (NIH): 126 mg/dL — ABNORMAL HIGH (ref 0–99)
Total Non-HDL-Chol (LDL+VLDL): 147 mg/dL — ABNORMAL HIGH (ref 0–129)
Triglycerides: 116 mg/dL (ref 0–149)
VLDL Cholesterol Cal: 21 mg/dL (ref 5–40)

## 2024-08-09 MED ORDER — ATORVASTATIN CALCIUM 80 MG PO TABS: 80.0000 mg | ORAL_TABLET | Freq: Every day | ORAL | 1 refills | Status: AC

## 2024-08-12 ENCOUNTER — Ambulatory Visit: Admitting: Physical Medicine and Rehabilitation

## 2024-08-13 ENCOUNTER — Ambulatory Visit: Admitting: Physical Therapy

## 2024-08-13 NOTE — Progress Notes (Signed)
 VIALS MADE ON 08/13/24

## 2024-08-14 NOTE — Telephone Encounter (Signed)
 Signed PCS request faxed to [facility]

## 2024-08-14 NOTE — Therapy (Incomplete)
 " OUTPATIENT PHYSICAL THERAPY DAILY NOTE   Patient Name: Jason Stokes MRN: [id number] DOB:24-Jul-1960, 64 y.o., male Today's Date: 08/14/2024  END OF SESSION:    Past Medical History:  Diagnosis Date   Anemia    Aneurysm of right internal iliac artery    a.) s/p embolization 08/19/2020: 29 mm RIGHT internal iliac artery aneurysm   Anxiety    Aortic atherosclerosis    Bilateral carpal tunnel syndrome 01/10/2018   Bipolar disorder (HCC)    Chorioretinal inflammation of both eyes    a.) on azothioprine   Chronic lower back pain    Coronary artery calcification seen on CT scan    a.) cCTA 04/21/2022: Ca score 20.6 (61st percentile for age/sex match control)   DDD (degenerative disc disease), cervical    Depression    Diastolic dysfunction    a.) TTE 01/13/2021: EF 60-65%, mod LVH, triv MR, G1DD   GERD (gastroesophageal reflux disease)    Hepatic steatosis    History of alcohol abuse    History of nuclear stress test    Myoview 10/16: EF 50%, diaphragmatic attenuation, no ischemia, low risk   Hypertension    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   a.) CT head 12/25/2012 --> RIGHT basal ganglia hypoattenuation related to remote lacunar infarct   Lipoma    Long term (current) use of anticoagulants    a.) apixaban    Long-term current use of immunomodulator    a.) on azothioprine for peripheral focal chorioretinal inflammation (both eyes)   Marijuana use    Migraine    Moderate persistent asthma with acute exacerbation 05/02/2018   PAF (paroxysmal atrial fibrillation) (HCC) 03/27/2015   a.) CHA2DS2VASc = 5 (HTN, CVA x2, vascular disease history, T2DM);  b.) s/p ablation 10/02/2015; c.) s/p ablation 12/10/2015; d.) s/p DCCV (200 J x 1) 12/11/2015; e.) s/p ablation 04/28/2022; f.) rate/rhythm maintained on oral diltiazem  + carvedilol ; chronically anticoagulated with apixaban    Pilonidal cyst    Schizophrenia (HCC)    Sciatica neuralgia    Stroke (HCC)    T2DM (type 2 diabetes  mellitus) (HCC)    Thoracic aortic ectasia 01/13/2021   a.) TTE 01/13/2021: Ao root 38 mm, asc Ao 39 mm   Past Surgical History:  Procedure Laterality Date   ATRIAL FIBRILLATION ABLATION  09/22/2015   ATRIAL FIBRILLATION ABLATION N/A 04/28/2022   Procedure: ATRIAL FIBRILLATION ABLATION;  Surgeon: Inocencio Soyla Lunger, MD;  Location: [facility];  Service: Cardiovascular;  Laterality: N/A;   CATARACT EXTRACTION Bilateral    CYST EXCISION  1996-97   surgery back of head    ELECTROPHYSIOLOGIC STUDY N/A 09/22/2015   Procedure: Atrial Fibrillation Ablation;  Surgeon: Will Lunger Inocencio, MD;  Location: [facility];  Service: Cardiovascular;  Laterality: N/A;   ELECTROPHYSIOLOGIC STUDY N/A 12/10/2015   Procedure: Atrial Fibrillation Ablation;  Surgeon: Will Lunger Inocencio, MD;  Location: [facility];  Service: Cardiovascular;  Laterality: N/A;   ELECTROPHYSIOLOGIC STUDY N/A 12/11/2015   Procedure: Cardioversion;  Surgeon: Will Lunger Inocencio, MD;  Location: [facility];  Service: Cardiovascular;  Laterality: N/A;   EMBOLIZATION (CATH LAB) Right 08/19/2020   Procedure: EMBOLIZATION;  Surgeon: Magda Debby SAILOR, MD;  Location: [facility];  Service: Cardiovascular;  Laterality: Right;  hypogastric   EXCISION MASS HEAD N/A 01/06/2017   Procedure: EXCISION MASS FOREHEAD;  Surgeon: Arelia Filippo, MD;  Location: [facility];  Service: Plastics;  Laterality: N/A;   EXCISION MASS UPPER EXTREMETIES  Right 08/08/2022   Procedure: EXCISION MASS RIGHT FOREARM;  Surgeon: Murrell Drivers, MD;  Location: [facility];  Service: Orthopedics;  Laterality: Right;  45 MIN   GANGLION CYST EXCISION Left    INTERCOSTAL NERVE BLOCK  07/19/2003   KNEE ARTHROSCOPY Right 07/18/2014   MASS EXCISION N/A 01/25/2021   Procedure: EXCISION SUBCUTANEOUS VS SUBFASCIAL MASS TORSO 3CM;  Surgeon: Arelia Filippo, MD;  Location: [facility];  Service:  Plastics;  Laterality: N/A;   OPEN SUBSCAPULARIS REPAIR Right 04/25/2024   Procedure: OPEN REPAIR RIGHT SHOULDER SUBSCAPULARIS;  Surgeon: Addie Cordella Hamilton, MD;  Location: [facility];  Service: Orthopedics;  Laterality: Right;   PILONIDAL CYST EXCISION N/A 09/13/2022   Procedure: CYST EXCISION PILONIDAL EXTENSIVE;  Surgeon: Desiderio Schanz, MD;  Location: [facility];  Service: General;  Laterality: N/A;   SHOULDER ARTHROSCOPY WITH ROTATOR CUFF REPAIR AND OPEN BICEPS TENODESIS Left 07/26/2023   Procedure: LEFT SHOULDER ARTHROSCOPY WITH OPEN ROTATOR CUFF REPAIR AND OPEN BICEPS TENODESIS;  Surgeon: Barbarann Oneil BROCKS, MD;  Location: [facility];  Service: Orthopedics;  Laterality: Left;   Patient Active Problem List   Diagnosis Date Noted   Epistaxis 07/29/2024   Seasonal allergic rhinitis due to pollen 07/29/2024   Asthma, severe persistent, well-controlled (HCC) 07/29/2024   Degenerative superior labral anterior-to-posterior (SLAP) tear of right shoulder 05/11/2024   Synovitis of right shoulder 05/11/2024   Chronic anticoagulation 02/19/2024   Shoulder joint pain 02/19/2024   Tear of left rotator cuff 02/19/2024   Impingement syndrome of right shoulder 10/03/2023   Tendinitis of upper biceps tendon of left shoulder 06/12/2023   Complete tear of right rotator cuff 10/28/2022   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   Pilonidal cyst 09/13/2022   Aortic atherosclerosis 06/07/2022   Mass of right upper extremity 03/30/2022   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, moderate stage 03/28/2022   Peripheral focal chorioretinal inflammation of both eyes 03/28/2022   Pseudophakia of both eyes 03/28/2022   Retinal edema 03/28/2022   Hypercoagulable state due to paroxysmal atrial fibrillation (HCC) 03/23/2021   Type 2 diabetes mellitus with hyperglycemia, without long-term current use of insulin  (HCC) 02/10/2021   Lumbar stenosis with neurogenic claudication 06/01/2020    Aneurysm of iliac artery 05/28/2020   Neck pain on left side 03/06/2019   Musculoskeletal chest pain 07/24/2018   Asthma, mild intermittent 05/02/2018   Seasonal and perennial allergic rhinitis 05/02/2018   Tobacco use 05/02/2018   Primary osteoarthritis of both hands 01/26/2018   Bilateral carpal tunnel syndrome 01/10/2018   Stroke (HCC)    Schizophrenia (HCC)    Migraine    History of nuclear stress test    History of alcohol abuse    GERD (gastroesophageal reflux disease)    Dysrhythmia    Chronic lower back pain    Anxiety    Arthritis of knee, right 04/12/2017   Bipolar disorder (HCC) 04/03/2017   Lipoma of forehead 09/22/2016   Trigger ring finger of right hand 06/22/2016   Paroxysmal atrial fibrillation (HCC)    AF (atrial fibrillation) (HCC) 12/10/2015   Eczema 07/10/2015   History of CVA (cerebrovascular accident) 05/04/2015   Tear of medial meniscus of right knee 03/18/2015   Chronic pain of right knee 03/12/2015   Other and unspecified hyperlipidemia 11/25/2013   Nasal congestion 11/25/2013   Sinusitis, chronic 05/09/2013   Chronic low back pain 10/12/2012   Lumbar radiculopathy 10/12/2012   Hypertension associated with diabetes (HCC) 10/12/2012  Depression 10/12/2012   Insomnia 10/12/2012    PCP: Delbert Clam, MD   REFERRING PROVIDER:  Addie Cordella Hamilton, MD     REFERRING DIAG:  [phone] (ICD-10-CM) - S/P right rotator cuff repair  active assisted range of motion and passive range of motion of the right shoulder 2 times a week for 6 weeks.  Do not really want him to do any more external rotation than what he has now which is about 45 to 50 degrees.    THERAPY DIAG:  No diagnosis found.  Rationale for Evaluation and Treatment: Rehabilitation  ONSET DATE: 04/25/2024 DOS  SUBJECTIVE:                                                                                                                                                                                       SUBJECTIVE STATEMENT: Pt reports that the shoulder pain is a little less.  Having more sciatic nerve pain today.  Hand dominance: Right  PERTINENT HISTORY: Relevant OMHx includes R SLAP tear, chronic anticoagulation, R UE mass, glaucoma, type 2 DM, Lumbar stenosis with neurogenic claudication, BIL CTS, stroke, schizophrenia, anxiety, bipolar disorder, paroxysmal atrial fibrillation, depression, insomnia, anemia, cervical DDD, HTN  PAIN:  Are you having pain?  Yes: NPRS scale: 7/10 Pain location: where the screws are at Pain description: aching, throbbing, sharp Aggravating factors: movement  Relieving factors: muscle relaxers  PRECAUTIONS: None  RED FLAGS: None   WEIGHT BEARING RESTRICTIONS: No  FALLS:  Has patient fallen in last 6 months? Yes. Number of falls 1, while walking up the hill to his daughter's house. R knee gave out on you.   LIVING ENVIRONMENT: Lives with: lives with their spouse Lives in: House/apartment Stairs: No Has following equipment at home: Single point cane, Environmental Consultant - 2 wheeled, shower chair, and Grab bars  OCCUPATION: N/a  PLOF: Independent  PATIENT GOALS:to be able to move arm, sleep, and babysit his grandkids without significant pain   NEXT MD VISIT:   OBJECTIVE:  Note: Objective measures were completed at Evaluation unless otherwise noted.  DIAGNOSTIC FINDINGS:  No post op imaging results available in epic; none included in referral    PATIENT SURVEYS:  Quick Dash:  QUICK DASH  Please rate your ability do the following activities in the last week by selecting the number below the appropriate response.   Activities Rating  Open a tight or new jar.  4 = Severe difficulty  Do heavy household chores (e.g., wash walls, floors). 2 = Mild difficulty  Carry a shopping bag or briefcase 3 = Moderate difficulty  Wash your back. 2 = Mild difficulty  Use a knife to cut  food. 4 = Severe difficulty  Recreational activities in  which you take some force or impact through your arm, shoulder or hand (e.g., golf, hammering, tennis, etc.). 4 = Severe difficulty  During the past week, to what extent has your arm, shoulder or hand problem interfered with your normal social activities with family, friends, neighbors or groups?  4 = Quite a bit  During the past week, were you limited in your work or other regular daily activities as a result of your arm, shoulder or hand problem? 3 = Moderately limited  Rate the severity of the following symptoms in the last week: Arm, Shoulder, or hand pain. 5 = Extreme  Rate the severity of the following symptoms in the last week: Tingling (pins and needles) in your arm, shoulder or hand. 4 = Severe  During the past week, how much difficulty have you had sleeping because of the pain in your arm, shoulder or hand?  5 = So much difficulty that I can't sleep   (A QuickDASH score may not be calculated if there is greater than 1 missing item.)  Quick Dash Disability/Symptom Score:  65.9  Minimally Clinically Important Difference (MCID): 15-20 points  Flavio, F. et al. (2013). Minimally clinically important difference of the disabilities of the arm, shoulder, and hand outcome measures (DASH) and its shortened version (Quick DASH). Journal of Orthopaedic & Sports Physical Therapy, 44(1), 30-39)   COGNITION: Overall cognitive status: Within functional limits for tasks assessed     SENSATION: Not tested  POSTURE: Rounded shoulders, forward head   UPPER EXTREMITY ROM:   Active ROM Right eval Left eval  Shoulder flexion 110   Shoulder extension    Shoulder abduction 70   Shoulder adduction    Shoulder internal rotation 60   Shoulder external rotation 56   Elbow flexion    Elbow extension    Wrist flexion    Wrist extension    Wrist ulnar deviation    Wrist radial deviation    Wrist pronation    Wrist supination    (Blank rows = not tested)  UPPER EXTREMITY MMT:  MMT  Right eval Left eval  Shoulder flexion    Shoulder extension    Shoulder abduction    Shoulder adduction    Shoulder internal rotation    Shoulder external rotation    Middle trapezius    Lower trapezius    Elbow flexion    Elbow extension    Wrist flexion    Wrist extension    Wrist ulnar deviation    Wrist radial deviation    Wrist pronation    Wrist supination    Grip strength (lbs)    (Blank rows = not tested)                                                                                                                               TREATMENT DATE:  [facility] Adult PT Treatment:  DATE: 08/16/24 Therapeutic Exercise: PROM flexion and scaption Chest press with dowel - 2# - 2x10 Supine flexion ROM - non-painful arc - 2x10 - 2# Pain free isometrics IR/ER - emphasis on pain free like you're picking up an empty coffee cup Pain free lateral towel slides Towel slide Row - GTB - 2x8x5s Therapeutic Exercise: *** Manual Therapy: *** Neuromuscular re-ed: *** Therapeutic Activity: *** Modalities: *** Self Care: ***  [facility] Adult PT Treatment  08/07/2023:  Therapeutic Exercise: PROM flexion and scaption Chest press with dowel - 2# - 2x10 Supine flexion ROM - non-painful arc - 2x10 - 2# Pain free isometrics IR/ER - emphasis on pain free like you're picking up an empty coffee cup Pain free lateral towel slides Towel slide Row - GTB - 2x8x5s    HOME EXERCISE PROGRAM: Access Code: [email or web address] URL: [email or web address] Date: 08/02/2024 Prepared by: Helene Gasmen  Exercises - Supine Shoulder Flexion Extension AAROM with Dowel  - 1 x daily - 7 x weekly - 2 sets - 10 reps - 5 sec hold - Standing Shoulder Abduction Slides at Wall  - 1 x daily - 7 x weekly - 2 sets - 10 reps - 5 sec hold - Standing shoulder flexion wall slides  - 1 x daily - 7 x weekly - 2 sets - 10 reps - 5 sec hold - Seated Scapular  Retraction  - 1 x daily - 7 x weekly - 2 sets - 10 reps - 3 sec hold - Seated Cervical Retraction  - 1 x daily - 7 x weekly - 2 sets - 10 reps - 3 sec hold - Standing Isometric Shoulder Internal Rotation at Doorway  - 1 x daily - 7 x weekly - 2 sets - 5 reps - 15 sec hold - Standing Isometric Shoulder External Rotation with Doorway  - 3 x daily - 7 x weekly - 2 sets - 5 reps - 15 sec hold  ASSESSMENT:  CLINICAL IMPRESSION:   08/14/2024:  Alexa tolerated session well with no adverse reaction.  Discussed the importance of avoiding stretching into ER and avoiding pain.  Continued with gentle isometrics and introduced towel sides.  Towel slides were initially pain free but pain did increase with more reps and improved with rests.  Discussed the importance of stopping at onset of pain.SABRA  EVAL: Lucus is a 64 y.o. male who was seen today for physical therapy evaluation and treatment for R shoulder pain with mobility deficits 3 months s/p R shoulder arthroscopy with open subscap repair augmented with patch . He is demonstrating decreased R shoulder AROM, decreased postural mm endurance, and has post op strength deficits. He has BIL UE numbness and tingling, with history of BIL CTS. He has related pain and difficulty with UE movement, lifting, reaching, ADLs/IADLs and has decreased sleep quality d/t pain. He requires skilled PT services at this time to address relevant deficits and improve overall function.     OBJECTIVE IMPAIRMENTS: decreased activity tolerance, decreased ROM, decreased strength, impaired UE functional use, improper body mechanics, postural dysfunction, and pain.   ACTIVITY LIMITATIONS: carrying, lifting, sleeping, bathing, dressing, reach over head, and hygiene/grooming  PARTICIPATION LIMITATIONS: meal prep, cleaning, laundry, interpersonal relationship, driving, shopping, and community activity  PERSONAL FACTORS: Past/current experiences, Time since onset of  injury/illness/exacerbation, and 3+ comorbidities: Relevant OMHx includes R SLAP tear, chronic anticoagulation, R UE mass, glaucoma, type 2 DM, Lumbar stenosis with neurogenic claudication, BIL CTS, stroke, schizophrenia, anxiety, bipolar disorder, paroxysmal atrial fibrillation, depression, insomnia, anemia, cervical  DDD, HTN are also affecting patient's functional outcome.   REHAB POTENTIAL: Fair    CLINICAL DECISION MAKING: Evolving/moderate complexity  EVALUATION COMPLEXITY: Moderate   GOALS: Goals reviewed with patient? YES  SHORT TERM GOALS: Target date: 08/22/2024   Patient will be independent with initial home program at least 3 days/week.  Baseline: provided at eval Goal Status: INITIAL   2.  Patient will demonstrate improved postural awareness for at least 15 minutes while seated without need for cueing from PT.  Baseline: see objective measures Goal Status: INITIAL   3.  Patient will demonstrate at least 20 degree improvement of R shoulder flexion and abduction AROM.  Baseline: see objective measures Goal status: INITIAL   LONG TERM GOALS: Target date: 09/19/2024    Patient will report improved overall functional ability with QuickDASH score of 20 or less.  Baseline: 65.9 Goal Status: INITIAL   2.  Patient will demonstrate at least 4+/5 MMT  Baseline: see objective measures  Goal status: INITIAL  3.  Patient will demonstrate ability to perform overhead lifting of at least 8# using appropriate body mechanics and with no more than minimal pain in order to safely perform normal daily tasks.   Baseline: unable   Goal Status: INITIAL   4.  Patient will report ability to sleep at least 6 hours/night with minimal pain-related sleep disturbance Baseline: unable to sleep d/t pain and hand numbness Goal status: INITIAL     PLAN:  PT FREQUENCY: 1-2x/week  PT DURATION: 8 weeks  PLANNED INTERVENTIONS: 97164- PT Re-evaluation, 97750- Physical Performance Testing,  97110-Therapeutic exercises, 97530- Therapeutic activity, [id number]- Neuromuscular re-education, 97535- Self Care, [id number]- Manual therapy, [id number]- Electrical stimulation (unattended), [phone]- Ionotophoresis 4mg /ml Dexamethasone , 79439 (1-2 muscles), 20561 (3+ muscles)- Dry Needling, Patient/Family education, Taping, Scar mobilization, Cryotherapy, and Moist heat  For all possible CPT codes, reference the Planned Interventions line above.     Check all conditions that are expected to impact treatment: {Conditions expected to impact treatment:Diabetes mellitus, Psychological or psychiatric disorders, and Social determinants of health      PLAN FOR NEXT SESSION: focus on PROM and AAROM per surgeon's rx, modalities, update HEP as indiciated    Nehemias Sauceda PT 08/14/2024 1:32 PM   "

## 2024-08-16 ENCOUNTER — Ambulatory Visit

## 2024-08-20 ENCOUNTER — Encounter: Payer: Self-pay | Admitting: Physical Therapy

## 2024-08-20 ENCOUNTER — Ambulatory Visit: Admitting: Physical Therapy

## 2024-08-20 DIAGNOSIS — G8929 Other chronic pain: Secondary | ICD-10-CM

## 2024-08-20 DIAGNOSIS — M25611 Stiffness of right shoulder, not elsewhere classified: Secondary | ICD-10-CM

## 2024-08-21 ENCOUNTER — Ambulatory Visit: Admitting: Physical Medicine and Rehabilitation

## 2024-08-21 ENCOUNTER — Encounter: Payer: Self-pay | Admitting: Physical Medicine and Rehabilitation

## 2024-08-21 DIAGNOSIS — G8929 Other chronic pain: Secondary | ICD-10-CM

## 2024-08-21 DIAGNOSIS — R269 Unspecified abnormalities of gait and mobility: Secondary | ICD-10-CM

## 2024-08-21 DIAGNOSIS — M5416 Radiculopathy, lumbar region: Secondary | ICD-10-CM

## 2024-08-21 MED ORDER — ACETAMINOPHEN-CODEINE 300-30 MG PO TABS: 1.0000 | ORAL_TABLET | Freq: Three times a day (TID) | ORAL | 0 refills | Status: AC | PRN

## 2024-08-21 NOTE — Progress Notes (Unsigned)
 Pain Scale   Average Pain 9 Patient advising he has lower back pain that radiates bilaterally to legs, patient advising he has had an injection in the pasted and it helped        +Driver, -BT, -Dye Allergies.

## 2024-08-21 NOTE — Progress Notes (Unsigned)
 "  Jason Stokes - 64 y.o. male MRN [id number]  Date of birth: March 17, 1961  Office Visit Note: Visit Date: 08/21/2024 PCP: Delbert Clam, MD Referred by: Delbert Clam, MD  Subjective: Chief Complaint  Patient presents with   Lower Back - Pain   HPI: Jason Stokes is a 64 y.o. male who comes in today for evaluation of chronic, worsening and severe bilateral lower back pain radiating down both legs. Pain ongoing for several years. Also reports cramping and numbness to legs/feet. His pain worsens with prolonged standing and walking. He describes pain as sore and pinching sensation, currently rates as 9 out of 10. Some relief of pain with home exercise regimen, rest and use of medications. Lumbar MRI imaging from 2024 shows moderate canal stenosis at L3-L4 with mild-to-moderate bilateral foraminal stenosis. Mild-to-moderate bilateral foraminal stenosis at L4-5. Mild bilateral foraminal stenosis at L5-S1.   He was previously managed by Dr. Rockey Peru and Dr. Lyle Cass at [facility] for same issue. History of lumbar epidural steroid injections with Dr. Cass, some relief of pain with these procedures. He is not interested in surgical intervention at this time.   Patient denies focal weakness. No recent trauma or falls. He is currently using cane to assist with ambulation Patients course is complicated by stroke, atrial fibrillation (currently taking Eliquis ), type 2 diabetes, chronic pain syndrome, bipolar disorder, schizophrenia and history of alcohol abuse.      Review of Systems  Musculoskeletal:  Positive for back pain.  Neurological:  Positive for tingling. Negative for focal weakness and weakness.  All other systems reviewed and are negative.  Otherwise per HPI.  Assessment & Plan: Visit Diagnoses:    ICD-10-CM   1. Chronic bilateral low back pain with bilateral sciatica  M54.42    M54.41    G89.29     2. Lumbar radiculopathy  M54.16     3. Gait  abnormality  R26.9        Plan: Findings:  Chronic, worsening and severe bilateral lower back pain radiating down both legs. Patient continues to have severe pain despite good conservative therapies such as home exercise regimen, rest and use of medications. Patients clinical presentation and exam are consistent with neurogenic claudication as a result of spinal canal stenosis. There is moderate central canal stenosis at the level of L3-L4. We discussed treatment plan in detail today. Next step is to perform diagnostic and hopefully therapeutic L4-L5 interlaminar epidural steroid injection under fluoroscopic guidance. He is currently taking Eliquis , we will need to get permission to discontinue this medication prior to injection. If good relief of pain with injection we can repeat this procedure infrequently as needed. Patient inquired about a more permanent fix for his pain, I suggested consult with our spine surgeon Dr. Ozell Ada. He would prefer to hold on any type of surgical procedures at this time. I prescribed short course of Tylenol  #3 for him while we are waiting on injection procedure. His exam today was difficult, likely due to pain and decreased effort. He has good strength to bilateral lower extremities.     Meds & Orders: No orders of the defined types were placed in this encounter.  No orders of the defined types were placed in this encounter.   Follow-up: Return for L4-L5 interlaminar epidural steroid injection.   Procedures: No procedures performed      Clinical History: Narrative & Impression CLINICAL DATA:  Low back pain radiating to the right leg  EXAM: MRI LUMBAR SPINE WITHOUT CONTRAST   TECHNIQUE: Multiplanar, multisequence MR imaging of the lumbar spine was performed. No intravenous contrast was administered.   COMPARISON:  MRI 05/27/2020   FINDINGS: Segmentation:  Standard.   Alignment:  Physiologic.   Vertebrae: No fracture, evidence of discitis, or  bone lesion. Mild diffuse intrinsic canal narrowing on the basis of congenitally short pedicles.   Conus medullaris and cauda equina: Conus extends to the T12-L1 level. Conus and cauda equina appear normal.   Paraspinal and other soft tissues: Negative.   Disc levels:   T12-L1: No significant disc protrusion, foraminal stenosis, or canal stenosis. Unchanged.   L1-L2: Minimal annular disc bulge. Mild facet hypertrophy. No foraminal or canal stenosis. Unchanged.   L2-L3: Mild annular disc bulge. Mild bilateral facet hypertrophy. Mild canal stenosis. No significant foraminal stenosis. Unchanged.   L3-L4: Annular disc bulge with mild facet hypertrophy. Mild prominence of the posterior epidural fat. Moderate canal stenosis with mild-to-moderate bilateral foraminal stenosis. Findings have progressed from prior.   L4-L5: Annular disc bulge with mild facet hypertrophy. Mild prominence of the posterior epidural fat. Mild bilateral subarticular recess stenosis and mild canal stenosis. Mild-to-moderate bilateral foraminal stenosis. Minimal interval progression.   L5-S1: Mild disc bulge and bilateral facet arthropathy. No canal stenosis. Mild bilateral foraminal stenosis, more pronounced on the right. Unchanged.   IMPRESSION: 1. Mild multilevel degenerative changes of the lumbar spine superimposed on mild diffuse intrinsic canal narrowing on the basis of congenitally short pedicles. Findings have slightly progressed since 2021. 2. Moderate canal stenosis at L3-4 with mild-to-moderate bilateral foraminal stenosis. 3. Mild canal stenosis at L2-3 and L4-5. 4. Mild-to-moderate bilateral foraminal stenosis at L4-5. Mild bilateral foraminal stenosis at L5-S1.     Electronically Signed   By: Mabel Converse D.O.   On: 01/04/2023 16:47   He reports that he has been smoking cigarettes. He started smoking about 30 years ago. He has a 0.2 pack-year smoking history. He has never been  exposed to tobacco smoke. He has never used smokeless tobacco.  Recent Labs    01/31/24 1437 04/22/24 1000 08/05/24 1348  HGBA1C 5.7 6.0* 5.7    Objective:  VS:  HT:    WT:   BMI:     BP:   HR: bpm  TEMP: ( )  RESP:  Physical Exam Vitals and nursing note reviewed.  HENT:     Head: Normocephalic and atraumatic.     Right Ear: External ear normal.     Left Ear: External ear normal.     Nose: Nose normal.     Mouth/Throat:     Mouth: Mucous membranes are moist.  Eyes:     Extraocular Movements: Extraocular movements intact.  Cardiovascular:     Rate and Rhythm: Normal rate.     Pulses: Normal pulses.  Pulmonary:     Effort: Pulmonary effort is normal.  Abdominal:     General: Abdomen is flat. There is no distension.  Musculoskeletal:        General: Tenderness present.     Cervical back: Normal range of motion.     Comments: His exam was difficult likely due to pain and decreased effort. Patient is slow to rise from seated position to standing. Good lumbar range of motion. No pain noted with facet loading. 5/5 strength noted with bilateral hip flexion, knee flexion/extension, ankle dorsiflexion/plantarflexion and EHL. No clonus noted bilaterally. No pain upon palpation of greater trochanters. No pain with internal/external rotation of bilateral hips.  Sensation intact bilaterally. Negative slump test bilaterally. Ambulates with cane, gait slow and unsteady.      Skin:    General: Skin is warm and dry.     Capillary Refill: Capillary refill takes less than 2 seconds.  Neurological:     Mental Status: He is alert and oriented to person, place, and time.     Gait: Gait abnormal.  Psychiatric:        Mood and Affect: Mood normal.        Behavior: Behavior normal.     Ortho Exam  Imaging: No results found.  Past Medical/Family/Surgical/Social History: Medications & Allergies reviewed per EMR, new medications updated. Patient Active Problem List   Diagnosis Date Noted    Epistaxis 07/29/2024   Seasonal allergic rhinitis due to pollen 07/29/2024   Asthma, severe persistent, well-controlled (HCC) 07/29/2024   Degenerative superior labral anterior-to-posterior (SLAP) tear of right shoulder 05/11/2024   Synovitis of right shoulder 05/11/2024   Chronic anticoagulation 02/19/2024   Shoulder joint pain 02/19/2024   Tear of left rotator cuff 02/19/2024   Impingement syndrome of right shoulder 10/03/2023   Tendinitis of upper biceps tendon of left shoulder 06/12/2023   Complete tear of right rotator cuff 10/28/2022   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   Pilonidal cyst 09/13/2022   Aortic atherosclerosis 06/07/2022   Mass of right upper extremity 03/30/2022   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, moderate stage 03/28/2022   Peripheral focal chorioretinal inflammation of both eyes 03/28/2022   Pseudophakia of both eyes 03/28/2022   Retinal edema 03/28/2022   Hypercoagulable state due to paroxysmal atrial fibrillation (HCC) 03/23/2021   Type 2 diabetes mellitus with hyperglycemia, without long-term current use of insulin  (HCC) 02/10/2021   Lumbar stenosis with neurogenic claudication 06/01/2020   Aneurysm of iliac artery 05/28/2020   Neck pain on left side 03/06/2019   Musculoskeletal chest pain 07/24/2018   Asthma, mild intermittent 05/02/2018   Seasonal and perennial allergic rhinitis 05/02/2018   Tobacco use 05/02/2018   Primary osteoarthritis of both hands 01/26/2018   Bilateral carpal tunnel syndrome 01/10/2018   Stroke (HCC)    Schizophrenia (HCC)    Migraine    History of nuclear stress test    History of alcohol abuse    GERD (gastroesophageal reflux disease)    Dysrhythmia    Chronic lower back pain    Anxiety    Arthritis of knee, right 04/12/2017   Bipolar disorder (HCC) 04/03/2017   Lipoma of forehead 09/22/2016   Trigger ring finger of right hand 06/22/2016   Paroxysmal atrial fibrillation (HCC)    AF  (atrial fibrillation) (HCC) 12/10/2015   Eczema 07/10/2015   History of CVA (cerebrovascular accident) 05/04/2015   Tear of medial meniscus of right knee 03/18/2015   Chronic pain of right knee 03/12/2015   Other and unspecified hyperlipidemia 11/25/2013   Nasal congestion 11/25/2013   Sinusitis, chronic 05/09/2013   Chronic low back pain 10/12/2012   Lumbar radiculopathy 10/12/2012   Hypertension associated with diabetes (HCC) 10/12/2012   Depression 10/12/2012   Insomnia 10/12/2012   Past Medical History:  Diagnosis Date   Anemia    Aneurysm of right internal iliac artery    a.) s/p embolization 08/19/2020: 29 mm RIGHT internal iliac artery aneurysm   Anxiety    Aortic atherosclerosis    Bilateral carpal tunnel syndrome 01/10/2018   Bipolar disorder (HCC)    Chorioretinal inflammation of both eyes    a.) on  azothioprine   Chronic lower back pain    Coronary artery calcification seen on CT scan    a.) cCTA 04/21/2022: Ca score 20.6 (61st percentile for age/sex match control)   DDD (degenerative disc disease), cervical    Depression    Diastolic dysfunction    a.) TTE 01/13/2021: EF 60-65%, mod LVH, triv MR, G1DD   GERD (gastroesophageal reflux disease)    Hepatic steatosis    History of alcohol abuse    History of nuclear stress test    Myoview 10/16: EF 50%, diaphragmatic attenuation, no ischemia, low risk   Hypertension    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   a.) CT head 12/25/2012 --> RIGHT basal ganglia hypoattenuation related to remote lacunar infarct   Lipoma    Long term (current) use of anticoagulants    a.) apixaban    Long-term current use of immunomodulator    a.) on azothioprine for peripheral focal chorioretinal inflammation (both eyes)   Marijuana use    Migraine    Moderate persistent asthma with acute exacerbation 05/02/2018   PAF (paroxysmal atrial fibrillation) (HCC) 03/27/2015   a.) CHA2DS2VASc = 5 (HTN, CVA x2, vascular disease history, T2DM);   b.) s/p ablation 10/02/2015; c.) s/p ablation 12/10/2015; d.) s/p DCCV (200 J x 1) 12/11/2015; e.) s/p ablation 04/28/2022; f.) rate/rhythm maintained on oral diltiazem  + carvedilol ; chronically anticoagulated with apixaban    Pilonidal cyst    Schizophrenia (HCC)    Sciatica neuralgia    Stroke (HCC)    T2DM (type 2 diabetes mellitus) (HCC)    Thoracic aortic ectasia 01/13/2021   a.) TTE 01/13/2021: Ao root 38 mm, asc Ao 39 mm   Family History  Problem Relation Age of Onset   Heart disease Father    Schizophrenia Sister    Past Surgical History:  Procedure Laterality Date   ATRIAL FIBRILLATION ABLATION  09/22/2015   ATRIAL FIBRILLATION ABLATION N/A 04/28/2022   Procedure: ATRIAL FIBRILLATION ABLATION;  Surgeon: Inocencio Soyla Lunger, MD;  Location: [facility];  Service: Cardiovascular;  Laterality: N/A;   CATARACT EXTRACTION Bilateral    CYST EXCISION  1996-97   surgery back of head    ELECTROPHYSIOLOGIC STUDY N/A 09/22/2015   Procedure: Atrial Fibrillation Ablation;  Surgeon: Will Lunger Inocencio, MD;  Location: [facility];  Service: Cardiovascular;  Laterality: N/A;   ELECTROPHYSIOLOGIC STUDY N/A 12/10/2015   Procedure: Atrial Fibrillation Ablation;  Surgeon: Will Lunger Inocencio, MD;  Location: [facility];  Service: Cardiovascular;  Laterality: N/A;   ELECTROPHYSIOLOGIC STUDY N/A 12/11/2015   Procedure: Cardioversion;  Surgeon: Will Lunger Inocencio, MD;  Location: [facility];  Service: Cardiovascular;  Laterality: N/A;   EMBOLIZATION (CATH LAB) Right 08/19/2020   Procedure: EMBOLIZATION;  Surgeon: Magda Debby SAILOR, MD;  Location: [facility];  Service: Cardiovascular;  Laterality: Right;  hypogastric   EXCISION MASS HEAD N/A 01/06/2017   Procedure: EXCISION MASS FOREHEAD;  Surgeon: Arelia Filippo, MD;  Location: [facility];  Service: Plastics;  Laterality: N/A;   EXCISION MASS UPPER EXTREMETIES Right 08/08/2022   Procedure:  EXCISION MASS RIGHT FOREARM;  Surgeon: Murrell Drivers, MD;  Location: [facility];  Service: Orthopedics;  Laterality: Right;  45 MIN   GANGLION CYST EXCISION Left    INTERCOSTAL NERVE BLOCK  07/19/2003   KNEE ARTHROSCOPY Right 07/18/2014   MASS EXCISION N/A 01/25/2021   Procedure: EXCISION SUBCUTANEOUS VS SUBFASCIAL MASS TORSO 3CM;  Surgeon: Arelia Filippo, MD;  Location: [facility];  Service: Plastics;  Laterality: N/A;   OPEN SUBSCAPULARIS REPAIR Right 04/25/2024   Procedure: OPEN REPAIR RIGHT SHOULDER SUBSCAPULARIS;  Surgeon: Addie Cordella Hamilton, MD;  Location: [facility];  Service: Orthopedics;  Laterality: Right;   PILONIDAL CYST EXCISION N/A 09/13/2022   Procedure: CYST EXCISION PILONIDAL EXTENSIVE;  Surgeon: Desiderio Schanz, MD;  Location: [facility];  Service: General;  Laterality: N/A;   SHOULDER ARTHROSCOPY WITH ROTATOR CUFF REPAIR AND OPEN BICEPS TENODESIS Left 07/26/2023   Procedure: LEFT SHOULDER ARTHROSCOPY WITH OPEN ROTATOR CUFF REPAIR AND OPEN BICEPS TENODESIS;  Surgeon: Barbarann Oneil BROCKS, MD;  Location: [facility];  Service: Orthopedics;  Laterality: Left;   Social History   Occupational History    Comment: disabled  Tobacco Use   Smoking status: Every Day    Current packs/day: 0.25    Average packs/day: 0.3 packs/day for 0.8 years (0.2 ttl pk-yrs)    Types: Cigarettes    Start date: 09/10/1993    Last attempt to quit: 09/10/2022    Passive exposure: Never   Smokeless tobacco: Never   Tobacco comments:    1 cigarettes every day  06-02-2021  Vaping Use   Vaping status: Never Used  Substance and Sexual Activity   Alcohol use: Yes    Alcohol/week: 6.0 standard drinks of alcohol    Types: 6 Cans of beer per week   Drug use: Not Currently    Types: Marijuana    Comment: Uses daily   Sexual activity: Not Currently    "

## 2024-08-23 ENCOUNTER — Encounter: Payer: Self-pay | Admitting: Physical Therapy

## 2024-08-23 ENCOUNTER — Ambulatory Visit: Admitting: Physical Therapy

## 2024-08-23 DIAGNOSIS — M25611 Stiffness of right shoulder, not elsewhere classified: Secondary | ICD-10-CM

## 2024-08-23 DIAGNOSIS — G8929 Other chronic pain: Secondary | ICD-10-CM

## 2024-08-23 DIAGNOSIS — R293 Abnormal posture: Secondary | ICD-10-CM

## 2024-08-23 NOTE — Therapy (Signed)
 " OUTPATIENT PHYSICAL THERAPY DAILY NOTE   Patient Name: Jason Stokes MRN: [id number] DOB:05-25-1961, 64 y.o., male Today's Date: 08/23/2024  END OF SESSION:  PT End of Session - 08/23/24 1022     Visit Number 5    Number of Visits 16    Date for Recertification  09/19/24    Authorization Type [employer] MCD    PT Start Time 1022   pt   PT Stop Time 1100    PT Time Calculation (min) 38 min    Activity Tolerance Patient limited by pain    Behavior During Therapy [facility] for tasks assessed/performed           Past Medical History:  Diagnosis Date   Anemia    Aneurysm of right internal iliac artery    a.) s/p embolization 08/19/2020: 29 mm RIGHT internal iliac artery aneurysm   Anxiety    Aortic atherosclerosis    Bilateral carpal tunnel syndrome 01/10/2018   Bipolar disorder (HCC)    Chorioretinal inflammation of both eyes    a.) on azothioprine   Chronic lower back pain    Coronary artery calcification seen on CT scan    a.) cCTA 04/21/2022: Ca score 20.6 (61st percentile for age/sex match control)   DDD (degenerative disc disease), cervical    Depression    Diastolic dysfunction    a.) TTE 01/13/2021: EF 60-65%, mod LVH, triv MR, G1DD   GERD (gastroesophageal reflux disease)    Hepatic steatosis    History of alcohol abuse    History of nuclear stress test    Myoview 10/16: EF 50%, diaphragmatic attenuation, no ischemia, low risk   Hypertension    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   a.) CT head 12/25/2012 --> RIGHT basal ganglia hypoattenuation related to remote lacunar infarct   Lipoma    Long term (current) use of anticoagulants    a.) apixaban    Long-term current use of immunomodulator    a.) on azothioprine for peripheral focal chorioretinal inflammation (both eyes)   Marijuana use    Migraine    Moderate persistent asthma with acute exacerbation 05/02/2018   PAF (paroxysmal atrial fibrillation) (HCC) 03/27/2015   a.) CHA2DS2VASc = 5 (HTN, CVA x2, vascular  disease history, T2DM);  b.) s/p ablation 10/02/2015; c.) s/p ablation 12/10/2015; d.) s/p DCCV (200 J x 1) 12/11/2015; e.) s/p ablation 04/28/2022; f.) rate/rhythm maintained on oral diltiazem  + carvedilol ; chronically anticoagulated with apixaban    Pilonidal cyst    Schizophrenia (HCC)    Sciatica neuralgia    Stroke (HCC)    T2DM (type 2 diabetes mellitus) (HCC)    Thoracic aortic ectasia 01/13/2021   a.) TTE 01/13/2021: Ao root 38 mm, asc Ao 39 mm   Past Surgical History:  Procedure Laterality Date   ATRIAL FIBRILLATION ABLATION  09/22/2015   ATRIAL FIBRILLATION ABLATION N/A 04/28/2022   Procedure: ATRIAL FIBRILLATION ABLATION;  Surgeon: Inocencio Soyla Lunger, MD;  Location: [facility];  Service: Cardiovascular;  Laterality: N/A;   CATARACT EXTRACTION Bilateral    CYST EXCISION  1996-97   surgery back of head    ELECTROPHYSIOLOGIC STUDY N/A 09/22/2015   Procedure: Atrial Fibrillation Ablation;  Surgeon: Will Lunger Inocencio, MD;  Location: [facility];  Service: Cardiovascular;  Laterality: N/A;   ELECTROPHYSIOLOGIC STUDY N/A 12/10/2015   Procedure: Atrial Fibrillation Ablation;  Surgeon: Will Lunger Inocencio, MD;  Location: [facility];  Service: Cardiovascular;  Laterality: N/A;   ELECTROPHYSIOLOGIC STUDY N/A 12/11/2015  Procedure: Cardioversion;  Surgeon: Will Gladis Norton, MD;  Location: [facility];  Service: Cardiovascular;  Laterality: N/A;   EMBOLIZATION (CATH LAB) Right 08/19/2020   Procedure: EMBOLIZATION;  Surgeon: Magda Debby SAILOR, MD;  Location: [facility];  Service: Cardiovascular;  Laterality: Right;  hypogastric   EXCISION MASS HEAD N/A 01/06/2017   Procedure: EXCISION MASS FOREHEAD;  Surgeon: Arelia Filippo, MD;  Location: [facility];  Service: Plastics;  Laterality: N/A;   EXCISION MASS UPPER EXTREMETIES Right 08/08/2022   Procedure: EXCISION MASS RIGHT FOREARM;  Surgeon: Murrell Drivers, MD;  Location: [facility];  Service: Orthopedics;  Laterality: Right;  45 MIN   GANGLION CYST EXCISION Left    INTERCOSTAL NERVE BLOCK  07/19/2003   KNEE ARTHROSCOPY Right 07/18/2014   MASS EXCISION N/A 01/25/2021   Procedure: EXCISION SUBCUTANEOUS VS SUBFASCIAL MASS TORSO 3CM;  Surgeon: Arelia Filippo, MD;  Location: [facility];  Service: Plastics;  Laterality: N/A;   OPEN SUBSCAPULARIS REPAIR Right 04/25/2024   Procedure: OPEN REPAIR RIGHT SHOULDER SUBSCAPULARIS;  Surgeon: Addie Cordella Hamilton, MD;  Location: [facility];  Service: Orthopedics;  Laterality: Right;   PILONIDAL CYST EXCISION N/A 09/13/2022   Procedure: CYST EXCISION PILONIDAL EXTENSIVE;  Surgeon: Desiderio Schanz, MD;  Location: [facility];  Service: General;  Laterality: N/A;   SHOULDER ARTHROSCOPY WITH ROTATOR CUFF REPAIR AND OPEN BICEPS TENODESIS Left 07/26/2023   Procedure: LEFT SHOULDER ARTHROSCOPY WITH OPEN ROTATOR CUFF REPAIR AND OPEN BICEPS TENODESIS;  Surgeon: Barbarann Oneil BROCKS, MD;  Location: [facility];  Service: Orthopedics;  Laterality: Left;   Patient Active Problem List   Diagnosis Date Noted   Epistaxis 07/29/2024   Seasonal allergic rhinitis due to pollen 07/29/2024   Asthma, severe persistent, well-controlled (HCC) 07/29/2024   Degenerative superior labral anterior-to-posterior (SLAP) tear of right shoulder 05/11/2024   Synovitis of right shoulder 05/11/2024   Chronic anticoagulation 02/19/2024   Shoulder joint pain 02/19/2024   Tear of left rotator cuff 02/19/2024   Impingement syndrome of right shoulder 10/03/2023   Tendinitis of upper biceps tendon of left shoulder 06/12/2023   Complete tear of right rotator cuff 10/28/2022   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   Pilonidal cyst 09/13/2022   Aortic atherosclerosis 06/07/2022   Mass of right upper extremity 03/30/2022   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, moderate stage 03/28/2022   Peripheral focal chorioretinal  inflammation of both eyes 03/28/2022   Pseudophakia of both eyes 03/28/2022   Retinal edema 03/28/2022   Hypercoagulable state due to paroxysmal atrial fibrillation (HCC) 03/23/2021   Type 2 diabetes mellitus with hyperglycemia, without long-term current use of insulin  (HCC) 02/10/2021   Lumbar stenosis with neurogenic claudication 06/01/2020   Aneurysm of iliac artery 05/28/2020   Neck pain on left side 03/06/2019   Musculoskeletal chest pain 07/24/2018   Asthma, mild intermittent 05/02/2018   Seasonal and perennial allergic rhinitis 05/02/2018   Tobacco use 05/02/2018   Primary osteoarthritis of both hands 01/26/2018   Bilateral carpal tunnel syndrome 01/10/2018   Stroke (HCC)    Schizophrenia (HCC)    Migraine    History of nuclear stress test    History of alcohol abuse    GERD (gastroesophageal reflux disease)    Dysrhythmia    Chronic lower back pain    Anxiety    Arthritis of knee, right 04/12/2017   Bipolar disorder (HCC) 04/03/2017   Lipoma of forehead 09/22/2016   Trigger  ring finger of right hand 06/22/2016   Paroxysmal atrial fibrillation (HCC)    AF (atrial fibrillation) (HCC) 12/10/2015   Eczema 07/10/2015   History of CVA (cerebrovascular accident) 05/04/2015   Tear of medial meniscus of right knee 03/18/2015   Chronic pain of right knee 03/12/2015   Other and unspecified hyperlipidemia 11/25/2013   Nasal congestion 11/25/2013   Sinusitis, chronic 05/09/2013   Chronic low back pain 10/12/2012   Lumbar radiculopathy 10/12/2012   Hypertension associated with diabetes (HCC) 10/12/2012   Depression 10/12/2012   Insomnia 10/12/2012    PCP: Delbert Clam, MD   REFERRING PROVIDER:  Addie Cordella Hamilton, MD     REFERRING DIAG:  [phone] (ICD-10-CM) - S/P right rotator cuff repair  active assisted range of motion and passive range of motion of the right shoulder 2 times a week for 6 weeks.  Do not really want him to do any more external rotation than what he  has now which is about 45 to 50 degrees.    THERAPY DIAG:  Chronic right shoulder pain  Stiffness of right shoulder, not elsewhere classified  Abnormal posture  Rationale for Evaluation and Treatment: Rehabilitation  ONSET DATE: 04/25/2024 DOS  SUBJECTIVE:                                                                                                                                                                                      SUBJECTIVE STATEMENT: Pt reports that he continues to have significant R shoulder pain following a slip and fall last week.  Did not directly fall on arm or shoulder but has had elevated pain since in his R shoulder and low back.  Hand dominance: Right  PERTINENT HISTORY: Relevant OMHx includes R SLAP tear, chronic anticoagulation, R UE mass, glaucoma, type 2 DM, Lumbar stenosis with neurogenic claudication, BIL CTS, stroke, schizophrenia, anxiety, bipolar disorder, paroxysmal atrial fibrillation, depression, insomnia, anemia, cervical DDD, HTN  PAIN:  Are you having pain?  Yes: NPRS scale: 7/10 Pain location: where the screws are at Pain description: aching, throbbing, sharp Aggravating factors: movement  Relieving factors: muscle relaxers  PRECAUTIONS: None  RED FLAGS: None   WEIGHT BEARING RESTRICTIONS: No  FALLS:  Has patient fallen in last 6 months? Yes. Number of falls 1, while walking up the hill to his daughter's house. R knee gave out on you.   LIVING ENVIRONMENT: Lives with: lives with their spouse Lives in: House/apartment Stairs: No Has following equipment at home: Single point cane, Environmental Consultant - 2 wheeled, shower chair, and Grab bars  OCCUPATION: N/a  PLOF: Independent  PATIENT GOALS:to be able to move arm, sleep, and babysit his grandkids  without significant pain   NEXT MD VISIT:   OBJECTIVE:  Note: Objective measures were completed at Evaluation unless otherwise noted.  DIAGNOSTIC FINDINGS:  No post op imaging  results available in epic; none included in referral    PATIENT SURVEYS:  Quick Dash:  QUICK DASH  Please rate your ability do the following activities in the last week by selecting the number below the appropriate response.   Activities Rating  Open a tight or new jar.  4 = Severe difficulty  Do heavy household chores (e.g., wash walls, floors). 2 = Mild difficulty  Carry a shopping bag or briefcase 3 = Moderate difficulty  Wash your back. 2 = Mild difficulty  Use a knife to cut food. 4 = Severe difficulty  Recreational activities in which you take some force or impact through your arm, shoulder or hand (e.g., golf, hammering, tennis, etc.). 4 = Severe difficulty  During the past week, to what extent has your arm, shoulder or hand problem interfered with your normal social activities with family, friends, neighbors or groups?  4 = Quite a bit  During the past week, were you limited in your work or other regular daily activities as a result of your arm, shoulder or hand problem? 3 = Moderately limited  Rate the severity of the following symptoms in the last week: Arm, Shoulder, or hand pain. 5 = Extreme  Rate the severity of the following symptoms in the last week: Tingling (pins and needles) in your arm, shoulder or hand. 4 = Severe  During the past week, how much difficulty have you had sleeping because of the pain in your arm, shoulder or hand?  5 = So much difficulty that I can't sleep   (A QuickDASH score may not be calculated if there is greater than 1 missing item.)  Quick Dash Disability/Symptom Score:  65.9  Minimally Clinically Important Difference (MCID): 15-20 points  Flavio, F. et al. (2013). Minimally clinically important difference of the disabilities of the arm, shoulder, and hand outcome measures (DASH) and its shortened version (Quick DASH). Journal of Orthopaedic & Sports Physical Therapy, 44(1), 30-39)   COGNITION: Overall cognitive status: Within functional  limits for tasks assessed     SENSATION: Not tested  POSTURE: Rounded shoulders, forward head   UPPER EXTREMITY ROM:   Active ROM Right eval Left eval  Shoulder flexion 110   Shoulder extension    Shoulder abduction 70   Shoulder adduction    Shoulder internal rotation 60   Shoulder external rotation 56   Elbow flexion    Elbow extension    Wrist flexion    Wrist extension    Wrist ulnar deviation    Wrist radial deviation    Wrist pronation    Wrist supination    (Blank rows = not tested)  UPPER EXTREMITY MMT:  MMT Right eval Left eval  Shoulder flexion    Shoulder extension    Shoulder abduction    Shoulder adduction    Shoulder internal rotation    Shoulder external rotation    Middle trapezius    Lower trapezius    Elbow flexion    Elbow extension    Wrist flexion    Wrist extension    Wrist ulnar deviation    Wrist radial deviation    Wrist pronation    Wrist supination    Grip strength (lbs)    (Blank rows = not tested)  [facility] Adult PT Treatment  08/24/2023:  Therapeutic Exercise: PROM flexion and scaption Supine chest press with dowel Supine flexion with dowel  Manual Therapy STM R UT, cervical paraspinals, LS    HOME EXERCISE PROGRAM: Access Code: [facility] URL: [email or web address] Date: 08/02/2024 Prepared by: Helene Gasmen  Exercises - Supine Shoulder Flexion Extension AAROM with Dowel  - 1 x daily - 7 x weekly - 2 sets - 10 reps - 5 sec hold - Standing Shoulder Abduction Slides at Wall  - 1 x daily - 7 x weekly - 2 sets - 10 reps - 5 sec hold - Standing shoulder flexion wall slides  - 1 x daily - 7 x weekly - 2 sets - 10 reps - 5 sec hold - Seated Scapular Retraction  - 1 x daily - 7 x weekly - 2 sets - 10 reps - 3 sec hold - Seated Cervical Retraction  - 1 x daily - 7 x weekly - 2 sets -  10 reps - 3 sec hold - Standing Isometric Shoulder Internal Rotation at Doorway  - 1 x daily - 7 x weekly - 2 sets - 5 reps - 15 sec hold - Standing Isometric Shoulder External Rotation with Doorway  - 3 x daily - 7 x weekly - 2 sets - 5 reps - 15 sec hold  ASSESSMENT:  CLINICAL IMPRESSION:   08/23/2024:  Alexa tolerated session fair with no adverse reaction.  Pt presents following fall last week with continued higher pain.  States that he can feel the anchors.  Encouraged him to continue with relative rest and gentle AAROM.  Regressed exercise today and proceeded with manual therapy for pain reduction.  Will follow up with ortho if not improving by next week.   EVAL: Rontae is a 64 y.o. male who was seen today for physical therapy evaluation and treatment for R shoulder pain with mobility deficits 3 months s/p R shoulder arthroscopy with open subscap repair augmented with patch . He is demonstrating decreased R shoulder AROM, decreased postural mm endurance, and has post op strength deficits. He has BIL UE numbness and tingling, with history of BIL CTS. He has related pain and difficulty with UE movement, lifting, reaching, ADLs/IADLs and has decreased sleep quality d/t pain. He requires skilled PT services at this time to address relevant deficits and improve overall function.     OBJECTIVE IMPAIRMENTS: decreased activity tolerance, decreased ROM, decreased strength, impaired UE functional use, improper body mechanics, postural dysfunction, and pain.   ACTIVITY LIMITATIONS: carrying, lifting, sleeping, bathing, dressing, reach over head, and hygiene/grooming  PARTICIPATION LIMITATIONS: meal prep, cleaning, laundry, interpersonal relationship, driving, shopping, and community activity  PERSONAL FACTORS: Past/current experiences, Time since onset of injury/illness/exacerbation, and 3+ comorbidities: Relevant OMHx includes R SLAP tear, chronic anticoagulation, R UE mass, glaucoma, type 2 DM,  Lumbar stenosis with neurogenic claudication, BIL CTS, stroke, schizophrenia, anxiety, bipolar disorder, paroxysmal atrial fibrillation, depression, insomnia, anemia, cervical DDD, HTN are also affecting patient's functional outcome.   REHAB POTENTIAL: Fair    CLINICAL DECISION MAKING: Evolving/moderate complexity  EVALUATION COMPLEXITY: Moderate   GOALS: Goals reviewed with patient? YES  SHORT TERM GOALS: Target date: 08/22/2024   Patient will be independent with initial home program at least 3 days/week.  Baseline: provided at eval Goal Status: INITIAL   2.  Patient will demonstrate improved postural awareness for at least 15 minutes while seated without need for cueing from PT.  Baseline: see objective measures Goal Status: INITIAL   3.  Patient will demonstrate at least 20 degree improvement of R shoulder flexion and abduction AROM.  Baseline: see objective measures Goal status: INITIAL   LONG TERM GOALS: Target date: 09/19/2024    Patient will report improved overall functional ability with QuickDASH score of 20 or less.  Baseline: 65.9 Goal Status: INITIAL   2.  Patient will demonstrate at least 4+/5 MMT  Baseline: see objective measures  Goal status: INITIAL  3.  Patient will demonstrate ability to perform overhead lifting of at least 8# using appropriate body mechanics and with no more than minimal pain in order to safely perform normal daily tasks.   Baseline: unable   Goal Status: INITIAL   4.  Patient will report ability to sleep at least 6 hours/night with minimal pain-related sleep disturbance Baseline: unable to sleep d/t pain and hand numbness Goal status: INITIAL     PLAN:  PT FREQUENCY: 1-2x/week  PT DURATION: 8 weeks  PLANNED INTERVENTIONS: 97164- PT Re-evaluation, 97750- Physical Performance Testing, 97110-Therapeutic exercises, 97530- Therapeutic activity, [id number]- Neuromuscular re-education, 97535- Self Care, [id number]- Manual therapy, [id number]-  Electrical stimulation (unattended), [phone]- Ionotophoresis 4mg /ml Dexamethasone , 79439 (1-2 muscles), 20561 (3+ muscles)- Dry Needling, Patient/Family education, Taping, Scar mobilization, Cryotherapy, and Moist heat  For all possible CPT codes, reference the Planned Interventions line above.     Check all conditions that are expected to impact treatment: {Conditions expected to impact treatment:Diabetes mellitus, Psychological or psychiatric disorders, and Social determinants of health      PLAN FOR NEXT SESSION: focus on PROM and AAROM per surgeon's rx, modalities, update HEP as indiciated    Helene BRAVO Milam Allbaugh PT 08/23/2024 11:02 AM   "

## 2024-08-27 ENCOUNTER — Ambulatory Visit

## 2024-08-29 ENCOUNTER — Ambulatory Visit: Admitting: Physical Therapy

## 2024-09-10 ENCOUNTER — Ambulatory Visit: Admitting: Dermatology

## 2024-10-21 ENCOUNTER — Ambulatory Visit: Admitting: Cardiology

## 2025-01-27 ENCOUNTER — Ambulatory Visit: Admitting: Family Medicine

## 2025-02-03 ENCOUNTER — Ambulatory Visit: Payer: Self-pay | Admitting: Family Medicine
